# Patient Record
Sex: Female | Born: 1935
Health system: Southern US, Community
[De-identification: ages and names within clinical notes are randomized; demographics above are authoritative.]

## PROBLEM LIST (undated history)

## (undated) DIAGNOSIS — K922 Gastrointestinal hemorrhage, unspecified: Secondary | ICD-10-CM

## (undated) DIAGNOSIS — F32A Depression, unspecified: Secondary | ICD-10-CM

## (undated) DIAGNOSIS — N312 Flaccid neuropathic bladder, not elsewhere classified: Secondary | ICD-10-CM

## (undated) DIAGNOSIS — I82409 Acute embolism and thrombosis of unspecified deep veins of unspecified lower extremity: Secondary | ICD-10-CM

## (undated) DIAGNOSIS — M810 Age-related osteoporosis without current pathological fracture: Secondary | ICD-10-CM

## (undated) DIAGNOSIS — N35919 Unspecified urethral stricture, male, unspecified site: Secondary | ICD-10-CM

## (undated) DIAGNOSIS — F329 Major depressive disorder, single episode, unspecified: Secondary | ICD-10-CM

## (undated) DIAGNOSIS — I480 Paroxysmal atrial fibrillation: Secondary | ICD-10-CM

## (undated) DIAGNOSIS — R600 Localized edema: Secondary | ICD-10-CM

## (undated) DIAGNOSIS — G4733 Obstructive sleep apnea (adult) (pediatric): Secondary | ICD-10-CM

## (undated) DIAGNOSIS — R339 Retention of urine, unspecified: Secondary | ICD-10-CM

## (undated) DIAGNOSIS — I4891 Unspecified atrial fibrillation: Secondary | ICD-10-CM

## (undated) DIAGNOSIS — R609 Edema, unspecified: Secondary | ICD-10-CM

## (undated) DIAGNOSIS — I272 Pulmonary hypertension, unspecified: Secondary | ICD-10-CM

## (undated) DIAGNOSIS — K900 Celiac disease: Secondary | ICD-10-CM

## (undated) DIAGNOSIS — I341 Nonrheumatic mitral (valve) prolapse: Secondary | ICD-10-CM

## (undated) DIAGNOSIS — N183 Chronic kidney disease, stage 3 unspecified: Secondary | ICD-10-CM

## (undated) DIAGNOSIS — K649 Unspecified hemorrhoids: Secondary | ICD-10-CM

## (undated) DIAGNOSIS — H9110 Presbycusis, unspecified ear: Secondary | ICD-10-CM

## (undated) DIAGNOSIS — E039 Hypothyroidism, unspecified: Secondary | ICD-10-CM

## (undated) DIAGNOSIS — R232 Flushing: Secondary | ICD-10-CM

## (undated) DIAGNOSIS — I10 Essential (primary) hypertension: Secondary | ICD-10-CM

## (undated) DIAGNOSIS — H3530 Unspecified macular degeneration: Secondary | ICD-10-CM

## (undated) DIAGNOSIS — E041 Nontoxic single thyroid nodule: Secondary | ICD-10-CM

## (undated) DIAGNOSIS — I6529 Occlusion and stenosis of unspecified carotid artery: Secondary | ICD-10-CM

## (undated) DIAGNOSIS — I340 Nonrheumatic mitral (valve) insufficiency: Secondary | ICD-10-CM

## (undated) DIAGNOSIS — E559 Vitamin D deficiency, unspecified: Secondary | ICD-10-CM

## (undated) DIAGNOSIS — N9989 Other postprocedural complications and disorders of genitourinary system: Secondary | ICD-10-CM

## (undated) DIAGNOSIS — G2581 Restless legs syndrome: Secondary | ICD-10-CM

## (undated) DIAGNOSIS — K219 Gastro-esophageal reflux disease without esophagitis: Secondary | ICD-10-CM

## (undated) DIAGNOSIS — D649 Anemia, unspecified: Secondary | ICD-10-CM

## (undated) DIAGNOSIS — Z7901 Long term (current) use of anticoagulants: Secondary | ICD-10-CM

## (undated) DIAGNOSIS — I351 Nonrheumatic aortic (valve) insufficiency: Secondary | ICD-10-CM

## (undated) DIAGNOSIS — I5032 Chronic diastolic (congestive) heart failure: Secondary | ICD-10-CM

## (undated) DIAGNOSIS — K259 Gastric ulcer, unspecified as acute or chronic, without hemorrhage or perforation: Secondary | ICD-10-CM

## (undated) DIAGNOSIS — I2510 Atherosclerotic heart disease of native coronary artery without angina pectoris: Secondary | ICD-10-CM

## (undated) DIAGNOSIS — G4700 Insomnia, unspecified: Secondary | ICD-10-CM

## (undated) HISTORY — DX: Restless legs syndrome: G25.81

## (undated) HISTORY — DX: Nonrheumatic mitral (valve) insufficiency: I34.0

## (undated) HISTORY — DX: Depression, unspecified: F32.A

## (undated) HISTORY — DX: Unspecified macular degeneration: H35.30

## (undated) HISTORY — DX: Major depressive disorder, single episode, unspecified: F32.9

## (undated) HISTORY — DX: Nontoxic single thyroid nodule: E04.1

## (undated) HISTORY — PX: APPENDECTOMY: SHX54

## (undated) HISTORY — DX: Occlusion and stenosis of unspecified carotid artery: I65.29

## (undated) HISTORY — DX: Obstructive sleep apnea (adult) (pediatric): G47.33

## (undated) HISTORY — DX: Presbycusis, unspecified ear: H91.10

## (undated) HISTORY — DX: Vitamin D deficiency, unspecified: E55.9

## (undated) HISTORY — PX: TONSILLECTOMY: SUR1361

## (undated) HISTORY — PX: TUBAL LIGATION: SHX77

## (undated) HISTORY — DX: Gastro-esophageal reflux disease without esophagitis: K21.9

## (undated) HISTORY — DX: Unspecified urethral stricture, male, unspecified site: N35.919

## (undated) HISTORY — PX: OTHER SURGICAL HISTORY: SHX169

## (undated) HISTORY — PX: MANDIBLE SURGERY: SHX707

---

## 1997-12-27 ENCOUNTER — Other Ambulatory Visit: Admission: RE | Admit: 1997-12-27 | Discharge: 1997-12-27 | Payer: Self-pay | Admitting: Obstetrics and Gynecology

## 1998-12-29 ENCOUNTER — Other Ambulatory Visit: Admission: RE | Admit: 1998-12-29 | Discharge: 1998-12-29 | Payer: Self-pay | Admitting: Obstetrics and Gynecology

## 2001-01-30 ENCOUNTER — Other Ambulatory Visit: Admission: RE | Admit: 2001-01-30 | Discharge: 2001-01-30 | Payer: Self-pay | Admitting: Obstetrics and Gynecology

## 2001-02-13 ENCOUNTER — Ambulatory Visit (HOSPITAL_COMMUNITY): Admission: RE | Admit: 2001-02-13 | Discharge: 2001-02-13 | Payer: Self-pay | Admitting: Gastroenterology

## 2002-03-01 ENCOUNTER — Other Ambulatory Visit: Admission: RE | Admit: 2002-03-01 | Discharge: 2002-03-01 | Payer: Self-pay | Admitting: Obstetrics and Gynecology

## 2002-07-16 ENCOUNTER — Ambulatory Visit (HOSPITAL_COMMUNITY): Admission: RE | Admit: 2002-07-16 | Discharge: 2002-07-16 | Payer: Self-pay | Admitting: Geriatric Medicine

## 2003-07-31 ENCOUNTER — Ambulatory Visit (HOSPITAL_COMMUNITY): Admission: RE | Admit: 2003-07-31 | Discharge: 2003-07-31 | Payer: Self-pay | Admitting: Geriatric Medicine

## 2004-06-25 ENCOUNTER — Encounter: Admission: RE | Admit: 2004-06-25 | Discharge: 2004-06-25 | Payer: Self-pay | Admitting: Geriatric Medicine

## 2005-01-20 ENCOUNTER — Encounter: Admission: RE | Admit: 2005-01-20 | Discharge: 2005-01-20 | Payer: Self-pay | Admitting: Geriatric Medicine

## 2007-01-20 ENCOUNTER — Encounter: Payer: Self-pay | Admitting: Gastroenterology

## 2007-01-26 ENCOUNTER — Encounter: Admission: RE | Admit: 2007-01-26 | Discharge: 2007-01-26 | Payer: Self-pay | Admitting: Geriatric Medicine

## 2007-02-21 ENCOUNTER — Encounter: Admission: RE | Admit: 2007-02-21 | Discharge: 2007-02-21 | Payer: Self-pay | Admitting: Geriatric Medicine

## 2008-02-08 ENCOUNTER — Encounter: Admission: RE | Admit: 2008-02-08 | Discharge: 2008-02-08 | Payer: Self-pay | Admitting: Geriatric Medicine

## 2008-04-03 ENCOUNTER — Ambulatory Visit (HOSPITAL_COMMUNITY): Admission: RE | Admit: 2008-04-03 | Discharge: 2008-04-03 | Payer: Self-pay | Admitting: Gastroenterology

## 2008-04-03 ENCOUNTER — Encounter (INDEPENDENT_AMBULATORY_CARE_PROVIDER_SITE_OTHER): Payer: Self-pay | Admitting: Gastroenterology

## 2008-04-23 ENCOUNTER — Encounter: Payer: Self-pay | Admitting: Gastroenterology

## 2008-05-15 ENCOUNTER — Encounter (INDEPENDENT_AMBULATORY_CARE_PROVIDER_SITE_OTHER): Payer: Self-pay | Admitting: Gastroenterology

## 2008-05-15 ENCOUNTER — Ambulatory Visit (HOSPITAL_COMMUNITY): Admission: RE | Admit: 2008-05-15 | Discharge: 2008-05-15 | Payer: Self-pay | Admitting: Gastroenterology

## 2008-05-21 ENCOUNTER — Encounter: Admission: RE | Admit: 2008-05-21 | Discharge: 2008-05-21 | Payer: Self-pay | Admitting: Gastroenterology

## 2008-05-30 ENCOUNTER — Telehealth (INDEPENDENT_AMBULATORY_CARE_PROVIDER_SITE_OTHER): Payer: Self-pay | Admitting: *Deleted

## 2008-05-30 ENCOUNTER — Encounter: Payer: Self-pay | Admitting: Gastroenterology

## 2008-06-06 ENCOUNTER — Ambulatory Visit (HOSPITAL_COMMUNITY): Admission: RE | Admit: 2008-06-06 | Discharge: 2008-06-06 | Payer: Self-pay | Admitting: Gastroenterology

## 2008-06-06 ENCOUNTER — Encounter: Payer: Self-pay | Admitting: Gastroenterology

## 2008-06-13 ENCOUNTER — Ambulatory Visit: Payer: Self-pay | Admitting: Gastroenterology

## 2009-07-02 ENCOUNTER — Encounter: Admission: RE | Admit: 2009-07-02 | Discharge: 2009-07-02 | Payer: Self-pay | Admitting: Geriatric Medicine

## 2010-09-22 ENCOUNTER — Encounter: Admission: RE | Admit: 2010-09-22 | Discharge: 2010-09-22 | Payer: Self-pay | Admitting: Geriatric Medicine

## 2011-03-09 NOTE — Op Note (Signed)
NAMEFARHEEN, PFAHLER             ACCOUNT NO.:  [id number]   MEDICAL RECORD NO.:  [id number]          PATIENT TYPE:  AMB   LOCATION:  ENDO                         FACILITY:  [facility]   PHYSICIAN:  Earle Gell, M.D.   DATE OF BIRTH:  11/23/1923   DATE OF PROCEDURE:  04/03/2008  DATE OF DISCHARGE:                               OPERATIVE REPORT   REFERRING PHYSICIAN:  Hal T. [place], MD   PROCEDURE AND INDICATIONS:  Ms. Shoshanna Mcquitty is an 77 year old female  born 09/12/1924.  Ms. Jedlicka has celiac disease and follows a gluten-  free diet.  She has intermittent esophageal dysphagia.  Approximately 1  year ago, her barium swallow with barium tablet was normal.   ENDOSCOPY:  Earle Gell, MD   PREMEDICATION:  1. Versed 3.5 mg.  2. Fentanyl 50 mcg.   PROCEDURE:  After obtaining informed consent, Ms. Mulkern was placed in  the left lateral decubitus position on the fluoroscopy table.  I  administered intravenous fentanyl and intravenous Versed to achieve  conscious sedation for the procedure.  The patient's blood pressure,  oxygen saturation and cardiac rhythm were monitored throughout the  procedure and documented in the medical record.   The Pentax gastroscope was passed through the posterior hypopharynx into  the proximal esophagus without difficulty.  The hypopharynx, larynx and  vocal cords appeared normal.   ESOPHAGOSCOPY:  The proximal, mid, and lower segments of the esophageal  mucosa appear completely normal.  The squamocolumnar junction and  esophagogastric junction are noted at approximately 35 cm from the  incisor teeth.  There is no endoscopic evidence for the presence of  Barrett's esophagus, erosive esophagitis, esophageal mucosal stricture  formation or esophageal obstruction.  Approximately, 5 biopsies were  taken along the length of the esophagus to look for eosinophilic  esophagitis pathologically.   GASTROSCOPY:  Retroflex view of the gastric cardia  and fundus was  normal.  The diaphragmatic hiatus is slightly patulous.  The gastric  body appears normal.  There are a few circular erosions with exudative  bases in the gastric antrum probably due to daily aspirin.  The pylorus  appears patent.  Biopsies were taken from the distal gastric antrum.   DUODENOSCOPY:  The duodenal bulb and descending duodenum appeared  normal.  Six biopsies were taken from the second portion of duodenum and  distal duodenal bulb to look for signs of villous atrophy.   ASSESSMENT:  1. Erosions in the gastric antrum probably secondary to aspirin;      esophagogastroduodenoscopy was otherwise normal.  2. Esophageal biopsies pending, rule out eosinophilic esophagitis.  3. Gastric antral biopsies pending, rule out Helicobacter pylori      gastritis.  4. Small bowel biopsies pending, rule out villous atrophy.           ______________________________  Earle Gell, M.D.     MJ/MEDQ  D:  04/03/2008  T:  04/03/2008  Job:  [id number]   cc:   Hal T. Stoneking, M.D.  Dr. Star Age

## 2011-03-09 NOTE — Op Note (Signed)
NAME:  Maria [name], FALTER             ACCOUNT NO.:  [id number]   MEDICAL RECORD NO.:  [id number]          PATIENT TYPE:  AMB   LOCATION:  ENDO                         FACILITY:  [facility]   PHYSICIAN:  Earle Gell, M.D.   DATE OF BIRTH:  03-11-1924   DATE OF PROCEDURE:  05/15/2008  DATE OF DISCHARGE:                               OPERATIVE REPORT   INDICATIONS FOR PROCEDURE:  [name] is an 77 year old  female born, 1923-11-15.  [name] has chronic celiac disease  treated with a gluten-free diet.   Approximately 7 weeks ago, she underwent an esophagogastroduodenoscopy,  which was normal except for the presence of erosions in the gastric  antrum.  Multiple small bowel biopsies were performed and a tiny focus  of adenocarcinoma was present in the small bowel biopsy jar  pathologically.  The gastric antral biopsies did not show H. pylori or  dysplasia.   She is scheduled to undergo a repeat esophagogastroduodenoscopy with  repeat biopsies.   ENDOSCOPIST:  Earle Gell, MD.   PREMEDICATION:  Fentanyl 50 mcg, Versed 3.5 mg.   PROCEDURE:  After obtaining informed consent, Ms. Thunder was placed in  the left lateral decubitus position.  I administered intravenous  fentanyl and intravenous Versed to achieve conscious sedation for the  procedure.  The patient's blood pressure, oxygen saturation, and cardiac  rhythm were monitored throughout the procedure and documented in the  medical record.   The Pentax gastroscope was passed through the posterior hypopharynx into  the proximal esophagus without difficulty.  The hypopharynx, larynx, and  vocal cords appeared normal.   Esophagoscopy:  The proximal, mid, and lower segments of the esophageal  mucosa appeared normal.  The squamocolumnar junction is noted at 35 cm  from the incisor teeth.  There is a shallow Schatzki ring at the  esophagogastric junction.   Gastroscopy:  Retroflex view of the gastric cardia and  fundus was  normal.  The diaphragmatic hiatus was patulous.  The gastric body  appeared normal.  There are scattered circular erosions in the distal  gastric antrum.  Multiple biopsies were obtained.  The pylorus appears  normal.   Duodenoscopy:  The duodenal bulb, second portion of duodenum, and third  portion of duodenum appear completely normal.  Multiple biopsies were  taken starting in the proximal third portion of the duodenum and  advancing back into the duodenal bulb.  I did not detect any mucosal  abnormalities.   ASSESSMENT:  1. Shallow Schatzki ring at the esophagogastric junction noted at 35      cm from the incisor teeth.  2. Small circular distal gastric antral erosions, biopsied.  3. Normal duodenal mucosa exam with duodenal biopsies pending.           ______________________________  Earle Gell, M.D.     MJ/MEDQ  D:  05/15/2008  T:  05/16/2008  Job:  [id number]   cc:   Hal T. Stoneking, M.D.

## 2011-03-12 NOTE — Procedures (Signed)
[facility]  Patient:    Maria [name], Maria [name]                      MRN: [id number] Proc. Date: 02/13/01 Attending:  Mickeal Skinner, M.D. CC:         Hal T. Stoneking, M.D.             Colan Neptune., M.D.                           Procedure Report  PROCEDURE:  Colonoscopy, distal ileoscopy.  REFERRING PHYSICIANS:  Colan Neptune., M.D., Hal T. Stoneking, M.D.  PROCEDURE INDICATION:  Maria [name] (date of birth 1924-05-10) is a 77 year old female who is due for surveillance colonoscopy with polypectomy to prevent colon cancer.  Ms. Mosco does experience intermittent left lower quadrant abdominal discomfort.  ENDOSCOPIST:  Mickeal Skinner, M.D.  PREMEDICATION:  Fentanyl 50 mcg, Versed 5 mg.  ENDOSCOPE:  Olympus pediatric colonoscope.  DESCRIPTION OF PROCEDURE:  After obtaining informed consent, the patient was placed in the left lateral decubitus position.  I administered intravenous fentanyl and intravenous Versed to achieve conscious sedation for the procedure.  The patients blood pressure, oxygen saturation, and cardiac rhythm were monitored throughout the procedure and documented in the medical record.  Anal inspection was normal.  Digital rectal examination was normal.  The Olympus pediatric video colonoscope was introduced into the rectum and under direct vision advanced to the cecum.  A normal-appearing ileocecal valve was intubated and the distal ileum inspected.  Colonic preparation for the exam today was excellent.  Rectum normal.  Sigmoid colon and descending colon normal.  Splenic flexure normal.  Transverse colon normal.  Hepatic flexure normal.  Ascending colon normal.  Cecum and ileocecal valve normal.  Distal ileum normal.  ASSESSMENT:  Normal proctocolonoscopy to the cecum with intubation of a normal-appearing ileocecal valve and distal ileal inspection. DD:  02/13/01 TD:   02/14/01 Job: [id number]

## 2011-07-22 LAB — TISSUE HYBRIDIZATION (BONE MARROW)-NCBH

## 2011-11-29 DIAGNOSIS — H16109 Unspecified superficial keratitis, unspecified eye: Secondary | ICD-10-CM | POA: Diagnosis not present

## 2011-11-29 DIAGNOSIS — H259 Unspecified age-related cataract: Secondary | ICD-10-CM | POA: Diagnosis not present

## 2012-01-18 DIAGNOSIS — H2510 Age-related nuclear cataract, unspecified eye: Secondary | ICD-10-CM | POA: Diagnosis not present

## 2012-01-18 DIAGNOSIS — H269 Unspecified cataract: Secondary | ICD-10-CM | POA: Diagnosis not present

## 2012-01-18 DIAGNOSIS — H5700 Unspecified anomaly of pupillary function: Secondary | ICD-10-CM | POA: Diagnosis not present

## 2012-01-18 DIAGNOSIS — H25019 Cortical age-related cataract, unspecified eye: Secondary | ICD-10-CM | POA: Diagnosis not present

## 2012-01-22 DIAGNOSIS — H16149 Punctate keratitis, unspecified eye: Secondary | ICD-10-CM | POA: Diagnosis not present

## 2012-01-25 DIAGNOSIS — I10 Essential (primary) hypertension: Secondary | ICD-10-CM | POA: Diagnosis not present

## 2012-01-25 DIAGNOSIS — J309 Allergic rhinitis, unspecified: Secondary | ICD-10-CM | POA: Diagnosis not present

## 2012-02-14 DIAGNOSIS — N841 Polyp of cervix uteri: Secondary | ICD-10-CM | POA: Diagnosis not present

## 2012-02-14 DIAGNOSIS — Z0000 Encounter for general adult medical examination without abnormal findings: Secondary | ICD-10-CM | POA: Diagnosis not present

## 2012-02-14 DIAGNOSIS — R1900 Intra-abdominal and pelvic swelling, mass and lump, unspecified site: Secondary | ICD-10-CM | POA: Diagnosis not present

## 2012-02-14 DIAGNOSIS — Z124 Encounter for screening for malignant neoplasm of cervix: Secondary | ICD-10-CM | POA: Diagnosis not present

## 2012-02-14 DIAGNOSIS — Z01419 Encounter for gynecological examination (general) (routine) without abnormal findings: Secondary | ICD-10-CM | POA: Diagnosis not present

## 2012-02-29 DIAGNOSIS — R141 Gas pain: Secondary | ICD-10-CM | POA: Diagnosis not present

## 2012-05-02 ENCOUNTER — Other Ambulatory Visit: Payer: Self-pay | Admitting: Geriatric Medicine

## 2012-05-02 DIAGNOSIS — Z79899 Other long term (current) drug therapy: Secondary | ICD-10-CM | POA: Diagnosis not present

## 2012-05-02 DIAGNOSIS — I10 Essential (primary) hypertension: Secondary | ICD-10-CM | POA: Diagnosis not present

## 2012-05-02 DIAGNOSIS — R110 Nausea: Secondary | ICD-10-CM | POA: Diagnosis not present

## 2012-05-02 DIAGNOSIS — R209 Unspecified disturbances of skin sensation: Secondary | ICD-10-CM | POA: Diagnosis not present

## 2012-05-04 ENCOUNTER — Ambulatory Visit
Admission: RE | Admit: 2012-05-04 | Discharge: 2012-05-04 | Disposition: A | Discharge status: Home / Self Care | Payer: Medicare Other | Source: Ambulatory Visit | Attending: Geriatric Medicine | Admitting: Geriatric Medicine

## 2012-05-04 DIAGNOSIS — R110 Nausea: Secondary | ICD-10-CM

## 2012-05-04 DIAGNOSIS — R1013 Epigastric pain: Secondary | ICD-10-CM | POA: Diagnosis not present

## 2012-06-23 DIAGNOSIS — I059 Rheumatic mitral valve disease, unspecified: Secondary | ICD-10-CM | POA: Diagnosis not present

## 2012-06-23 DIAGNOSIS — R002 Palpitations: Secondary | ICD-10-CM | POA: Diagnosis not present

## 2012-06-23 DIAGNOSIS — M549 Dorsalgia, unspecified: Secondary | ICD-10-CM | POA: Diagnosis not present

## 2012-06-23 DIAGNOSIS — I10 Essential (primary) hypertension: Secondary | ICD-10-CM | POA: Diagnosis not present

## 2012-06-23 DIAGNOSIS — R5381 Other malaise: Secondary | ICD-10-CM | POA: Diagnosis not present

## 2012-06-23 DIAGNOSIS — K909 Intestinal malabsorption, unspecified: Secondary | ICD-10-CM | POA: Diagnosis not present

## 2012-06-23 DIAGNOSIS — R5383 Other fatigue: Secondary | ICD-10-CM | POA: Diagnosis not present

## 2012-06-29 DIAGNOSIS — I059 Rheumatic mitral valve disease, unspecified: Secondary | ICD-10-CM | POA: Diagnosis not present

## 2012-07-17 DIAGNOSIS — R42 Dizziness and giddiness: Secondary | ICD-10-CM | POA: Diagnosis not present

## 2012-07-17 DIAGNOSIS — R6884 Jaw pain: Secondary | ICD-10-CM | POA: Diagnosis not present

## 2012-07-17 DIAGNOSIS — R110 Nausea: Secondary | ICD-10-CM | POA: Diagnosis not present

## 2012-07-17 DIAGNOSIS — I10 Essential (primary) hypertension: Secondary | ICD-10-CM | POA: Diagnosis not present

## 2012-07-17 DIAGNOSIS — R232 Flushing: Secondary | ICD-10-CM | POA: Diagnosis not present

## 2012-07-21 DIAGNOSIS — I10 Essential (primary) hypertension: Secondary | ICD-10-CM | POA: Diagnosis not present

## 2012-07-21 DIAGNOSIS — R0989 Other specified symptoms and signs involving the circulatory and respiratory systems: Secondary | ICD-10-CM | POA: Diagnosis not present

## 2012-07-21 DIAGNOSIS — I4891 Unspecified atrial fibrillation: Secondary | ICD-10-CM | POA: Diagnosis not present

## 2012-07-21 DIAGNOSIS — K900 Celiac disease: Secondary | ICD-10-CM | POA: Diagnosis not present

## 2012-07-21 DIAGNOSIS — R0602 Shortness of breath: Secondary | ICD-10-CM | POA: Diagnosis not present

## 2012-07-21 DIAGNOSIS — R0609 Other forms of dyspnea: Secondary | ICD-10-CM | POA: Diagnosis not present

## 2012-07-21 DIAGNOSIS — R002 Palpitations: Secondary | ICD-10-CM | POA: Diagnosis not present

## 2012-07-21 DIAGNOSIS — Z79899 Other long term (current) drug therapy: Secondary | ICD-10-CM | POA: Diagnosis not present

## 2012-08-01 DIAGNOSIS — I6529 Occlusion and stenosis of unspecified carotid artery: Secondary | ICD-10-CM | POA: Diagnosis not present

## 2012-08-01 DIAGNOSIS — I10 Essential (primary) hypertension: Secondary | ICD-10-CM | POA: Diagnosis not present

## 2012-08-01 DIAGNOSIS — E041 Nontoxic single thyroid nodule: Secondary | ICD-10-CM | POA: Diagnosis not present

## 2012-08-01 DIAGNOSIS — I4891 Unspecified atrial fibrillation: Secondary | ICD-10-CM | POA: Diagnosis not present

## 2012-08-28 DIAGNOSIS — Z23 Encounter for immunization: Secondary | ICD-10-CM | POA: Diagnosis not present

## 2012-09-13 ENCOUNTER — Observation Stay (HOSPITAL_COMMUNITY)
Admission: EM | Admit: 2012-09-13 | Discharge: 2012-09-14 | Disposition: A | Discharge status: Home / Self Care | Payer: Medicare Other | Attending: Emergency Medicine | Admitting: Emergency Medicine

## 2012-09-13 ENCOUNTER — Emergency Department (HOSPITAL_COMMUNITY): Payer: Medicare Other

## 2012-09-13 ENCOUNTER — Encounter (HOSPITAL_COMMUNITY): Payer: Self-pay | Admitting: Emergency Medicine

## 2012-09-13 DIAGNOSIS — R109 Unspecified abdominal pain: Secondary | ICD-10-CM

## 2012-09-13 DIAGNOSIS — Z8719 Personal history of other diseases of the digestive system: Secondary | ICD-10-CM | POA: Diagnosis not present

## 2012-09-13 DIAGNOSIS — I10 Essential (primary) hypertension: Secondary | ICD-10-CM | POA: Diagnosis not present

## 2012-09-13 DIAGNOSIS — M810 Age-related osteoporosis without current pathological fracture: Secondary | ICD-10-CM | POA: Insufficient documentation

## 2012-09-13 DIAGNOSIS — R1013 Epigastric pain: Secondary | ICD-10-CM | POA: Diagnosis not present

## 2012-09-13 DIAGNOSIS — R1110 Vomiting, unspecified: Secondary | ICD-10-CM | POA: Diagnosis not present

## 2012-09-13 DIAGNOSIS — I4891 Unspecified atrial fibrillation: Secondary | ICD-10-CM | POA: Diagnosis not present

## 2012-09-13 HISTORY — DX: Celiac disease: K90.0

## 2012-09-13 HISTORY — DX: Essential (primary) hypertension: I10

## 2012-09-13 HISTORY — DX: Unspecified atrial fibrillation: I48.91

## 2012-09-13 HISTORY — DX: Age-related osteoporosis without current pathological fracture: M81.0

## 2012-09-13 LAB — COMPREHENSIVE METABOLIC PANEL
AST: 30 U/L (ref 0–37)
Albumin: 3.8 g/dL (ref 3.5–5.2)
Alkaline Phosphatase: 74 U/L (ref 39–117)
BUN: 10 mg/dL (ref 6–23)
CO2: 25 mEq/L (ref 19–32)
Chloride: 97 mEq/L (ref 96–112)
Creatinine, Ser: 0.54 mg/dL (ref 0.50–1.10)
GFR calc non Af Amer: 83 mL/min — ABNORMAL LOW (ref >90)
Potassium: 3.7 mEq/L (ref 3.5–5.1)
Total Bilirubin: 0.5 mg/dL (ref 0.3–1.2)

## 2012-09-13 LAB — URINALYSIS, MICROSCOPIC ONLY
Bilirubin Urine: NEGATIVE
Glucose, UA: NEGATIVE mg/dL
Ketones, ur: 15 mg/dL — AB
Protein, ur: NEGATIVE mg/dL
pH: 7 (ref 5.0–8.0)

## 2012-09-13 LAB — POCT I-STAT TROPONIN I: Troponin i, poc: 0 ng/mL (ref 0.00–0.08)

## 2012-09-13 LAB — CBC WITH DIFFERENTIAL/PLATELET
Basophils Relative: 0 % (ref 0–1)
HCT: 38.2 % (ref 36.0–46.0)
Hemoglobin: 13.4 g/dL (ref 12.0–15.0)
Lymphocytes Relative: 15 % (ref 12–46)
MCHC: 35.1 g/dL (ref 30.0–36.0)
Monocytes Absolute: 0.6 10*3/uL (ref 0.1–1.0)
Monocytes Relative: 6 % (ref 3–12)
Neutro Abs: 7.9 10*3/uL — ABNORMAL HIGH (ref 1.7–7.7)
Neutrophils Relative %: 78 % — ABNORMAL HIGH (ref 43–77)
RBC: 4.28 MIL/uL (ref 3.87–5.11)
WBC: 10.2 10*3/uL (ref 4.0–10.5)

## 2012-09-13 LAB — LACTIC ACID, PLASMA: Lactic Acid, Venous: 1.3 mmol/L (ref 0.5–2.2)

## 2012-09-13 LAB — LIPASE, BLOOD: Lipase: 24 U/L (ref 11–59)

## 2012-09-13 MED ORDER — ONDANSETRON HCL 4 MG/2ML IJ SOLN: 4.0000 mg | Freq: Four times a day (QID) | INTRAMUSCULAR | Status: DC | PRN

## 2012-09-13 MED ORDER — ONDANSETRON 4 MG PO TBDP
8.0000 mg | ORAL_TABLET | Freq: Once | ORAL | Status: AC
  Administered 2012-09-13: 8 mg via ORAL
  Filled 2012-09-13: qty 2

## 2012-09-13 MED ORDER — SODIUM CHLORIDE 0.9 % IV BOLUS (SEPSIS)
500.0000 mL | Freq: Once | INTRAVENOUS | Status: AC
  Administered 2012-09-13: 500 mL via INTRAVENOUS

## 2012-09-13 MED ORDER — ACETAMINOPHEN 325 MG PO TABS: 650.0000 mg | ORAL_TABLET | ORAL | Status: DC | PRN

## 2012-09-13 MED ORDER — MORPHINE SULFATE 2 MG/ML IJ SOLN: 2.0000 mg | INTRAMUSCULAR | Status: DC | PRN

## 2012-09-13 MED ORDER — PANTOPRAZOLE SODIUM 40 MG IV SOLR
40.0000 mg | Freq: Once | INTRAVENOUS | Status: AC
  Administered 2012-09-13: 40 mg via INTRAVENOUS
  Filled 2012-09-13: qty 40

## 2012-09-13 MED ORDER — SODIUM CHLORIDE 0.9 % IV SOLN: Freq: Once | INTRAVENOUS | Status: DC

## 2012-09-13 NOTE — ED Provider Notes (Signed)
History     CSN: [id number]  Arrival date & time 09/13/12  2103   First MD Initiated Contact with Patient 09/13/12 2132      Chief Complaint  Patient presents with  . Abdominal Pain    (Consider location/radiation/quality/duration/timing/severity/associated sxs/prior treatment) HPI Pt with complaint of epigastric abdominal pain along with emesis starting last night. Pain without radiation. Described as a cramping discomfort. Currently mild in intensity, previously severe. Symptoms have been waxing and waning. No fever, no dysuria. NBNB emesis >10 times. Tried to take tums but had no relief. Pain worse after PO intake. Not worse with exertion. No SOB.   Past Medical History  Diagnosis Date  . Hypertension   . Atrial fibrillation   . Osteoporosis   . Celiac disease     Past Surgical History  Procedure Date  . Appendectomy   . Tubal ligation     No family history on file.  History  Substance Use Topics  . Smoking status: Never Smoker   . Smokeless tobacco: Not on file  . Alcohol Use: No    OB History    Grav Para Term Preterm Abortions TAB SAB Ect Mult Living                  Review of Systems Constitutional: Negative for fever.  Eyes: Negative for vision loss.  ENT: Negative for difficulty swallowing.  Cardiovascular: Negative for chest pain. Respiratory: Negative for respiratory distress.  Gastrointestinal:  Positive for vomiting.  Genitourinary: Negative for inability to void.  Musculoskeletal: Negative for gait problem.  Integumentary: Negative for rash.  Neurological: Negative for new focal weakness.     Allergies  Review of patient's allergies indicates not on file.  Home Medications  No current outpatient prescriptions on file.  BP 174/57  Pulse 60  Temp 98.6 F (37 C) (Oral)  Resp 17  SpO2 99%  Physical Exam Nursing note and vitals reviewed.  Constitutional: Pt is alert and appears stated age. Eyes: No injection, no scleral  icterus. HENT: Atraumatic, airway open without erythema or exudate.  Respiratory: No respiratory distress. Equal breathing bilaterally. Cardiovascular: Normal rate. Extremities warm and well perfused.  Abdomen: Soft, mild epigastric tenderness. No RUQ tenderness. No rebound, no guarding.  MSK: Extremities are atraumatic without deformity. Skin: No rash, no wounds.   Neuro: No motor nor sensory deficit.     ED Course  Procedures (including critical care time)  Labs Reviewed  CBC WITH DIFFERENTIAL - Abnormal; Notable for the following:    Neutrophils Relative 78 (*)     Neutro Abs 7.9 (*)     All other components within normal limits  COMPREHENSIVE METABOLIC PANEL - Abnormal; Notable for the following:    Sodium 133 (*)     Glucose, Bld 104 (*)     GFR calc non Af Amer 83 (*)     All other components within normal limits  URINALYSIS, MICROSCOPIC ONLY - Abnormal; Notable for the following:    Hgb urine dipstick SMALL (*)     Ketones, ur 15 (*)     Leukocytes, UA TRACE (*)     All other components within normal limits  LIPASE, BLOOD  LACTIC ACID, PLASMA  POCT I-STAT TROPONIN I  URINALYSIS, ROUTINE W REFLEX MICROSCOPIC   Dg Abd Acute W/chest  09/13/2012  *RADIOLOGY REPORT*  Clinical Data:  Abdominal pain, nausea, possible gastritis  ACUTE ABDOMEN SERIES (ABDOMEN 2 VIEW & CHEST 1 VIEW)  Comparison: None  Findings: Enlargement of  cardiac silhouette. Calcification and tortuosity of the aorta. Pulmonary vascularity normal. Emphysematous changes. No infiltrate, pleural effusion or pneumothorax. Minimal diffuse interstitial prominence. Prominent first costochondral junctions. Osseous demineralization with levoconvex scoliosis lumbar spine. Normal bowel gas pattern. No bowel dilatation or bowel wall thickening, or free intraperitoneal air. No urinary tract calcification.  IMPRESSION: No acute abdominal findings. Enlargement of cardiac silhouette. Probable COPD.   Original Report  Authenticated By: Lavonia Dana, M.D.      1. Abdominal pain   2. Emesis       MDM  78 y.o. female w/ pertinent PMHx of paroxysmal a fib, HTN, celiac disease presents w/ abdominal pain, N/V. Pt's husband is retired internal medicine doctor. Doubt ACS but will evaluation for that. Pt's abdomen is soft so will hold on CT scan. Pt give ODT zofran, offered pain meds but declined. Will give 500 cc IV fluid bolus. EKG without ST elevation/depression. AAS with no acute findings. Troponin low. CBC unremarkable, lactic acid low, lipase low, CMP unremarkable. UA with trace leuks but neg nitrites and no WBC. Positive ketones. On re-eval pt stable with NAD. No return of severe pain. Abdominal exam benign. Do not feel there is evidence for ACS. Feel pain is likely GI related, possible ulcer. Long discussion with pt and husband regarding disposition. Plan to keep pt in CDU overnight for monitoring. Orders placed for dehydration observation. Plan to give IV protonix now. IV fluid ordered along with prn meds. If pt with pain overnight would consider Ct abd/pelvis. If pain free overnight would PO challenge in the AM and d/c home with PPI.   After pt and husband had chance to speak they would prefer to go home and f/u with PCP as long as pt able to tolerate PO. Pt give fluid and able to take without emesis and without return of pain. Since pt looks well with unremarkable vital signs, benign abdominal exam, no chest pain, I feel that d/c is reasonable option. Strict return precautions along with f/u instructions given. Pt and husband verbalized understanding.   I independently viewed, interpreted, and used in my medical decision making all ordered lab and imaging tests. Medical Decision Making discussed with ED attending Dot Lanes, MD     Dayton Scrape, MD 09/14/12 [id number]

## 2012-09-13 NOTE — ED Notes (Signed)
C/o epigastric pain, nausea, and dry heaves since last night.  Pt reports 3 loose BMs this morning.

## 2012-09-14 MED ORDER — ONDANSETRON 4 MG PO TBDP: 4.0000 mg | ORAL_TABLET | Freq: Three times a day (TID) | ORAL | Status: DC | PRN

## 2012-09-14 MED ORDER — PANTOPRAZOLE SODIUM 20 MG PO TBEC: 20.0000 mg | DELAYED_RELEASE_TABLET | Freq: Every day | ORAL | Status: DC

## 2012-09-14 NOTE — ED Provider Notes (Signed)
I saw and evaluated the patient, reviewed the resident's note and I agree with the findings and plan.   .Face to face Exam:  General:  Awake HEENT:  Atraumatic Resp:  Normal effort Abd:  Nondistended Neuro:No focal weakness Lymph: No adenopathy   Dot Lanes, MD 09/14/12 2231

## 2012-09-14 NOTE — ED Notes (Signed)
Instructed to return to the ED for further problems.

## 2012-09-26 DIAGNOSIS — I10 Essential (primary) hypertension: Secondary | ICD-10-CM | POA: Diagnosis not present

## 2012-09-26 DIAGNOSIS — R1013 Epigastric pain: Secondary | ICD-10-CM | POA: Diagnosis not present

## 2012-09-28 ENCOUNTER — Other Ambulatory Visit: Payer: Self-pay | Admitting: Gastroenterology

## 2012-09-28 DIAGNOSIS — D1330 Benign neoplasm of unspecified part of small intestine: Secondary | ICD-10-CM | POA: Diagnosis not present

## 2012-09-28 DIAGNOSIS — K900 Celiac disease: Secondary | ICD-10-CM | POA: Diagnosis not present

## 2012-09-28 DIAGNOSIS — R1013 Epigastric pain: Secondary | ICD-10-CM | POA: Diagnosis not present

## 2012-09-28 DIAGNOSIS — K259 Gastric ulcer, unspecified as acute or chronic, without hemorrhage or perforation: Secondary | ICD-10-CM | POA: Diagnosis not present

## 2012-09-28 DIAGNOSIS — K449 Diaphragmatic hernia without obstruction or gangrene: Secondary | ICD-10-CM | POA: Diagnosis not present

## 2012-09-28 DIAGNOSIS — K2960 Other gastritis without bleeding: Secondary | ICD-10-CM | POA: Diagnosis not present

## 2012-10-31 DIAGNOSIS — K259 Gastric ulcer, unspecified as acute or chronic, without hemorrhage or perforation: Secondary | ICD-10-CM | POA: Diagnosis not present

## 2012-10-31 DIAGNOSIS — I10 Essential (primary) hypertension: Secondary | ICD-10-CM | POA: Diagnosis not present

## 2012-11-02 DIAGNOSIS — H259 Unspecified age-related cataract: Secondary | ICD-10-CM | POA: Diagnosis not present

## 2012-11-02 DIAGNOSIS — Z961 Presence of intraocular lens: Secondary | ICD-10-CM | POA: Diagnosis not present

## 2012-11-02 DIAGNOSIS — H04129 Dry eye syndrome of unspecified lacrimal gland: Secondary | ICD-10-CM | POA: Diagnosis not present

## 2012-11-28 DIAGNOSIS — H2510 Age-related nuclear cataract, unspecified eye: Secondary | ICD-10-CM | POA: Diagnosis not present

## 2012-11-28 DIAGNOSIS — H269 Unspecified cataract: Secondary | ICD-10-CM | POA: Diagnosis not present

## 2012-11-28 DIAGNOSIS — H25019 Cortical age-related cataract, unspecified eye: Secondary | ICD-10-CM | POA: Diagnosis not present

## 2012-12-06 DIAGNOSIS — I4891 Unspecified atrial fibrillation: Secondary | ICD-10-CM | POA: Diagnosis not present

## 2012-12-06 DIAGNOSIS — K259 Gastric ulcer, unspecified as acute or chronic, without hemorrhage or perforation: Secondary | ICD-10-CM | POA: Diagnosis not present

## 2012-12-06 DIAGNOSIS — I453 Trifascicular block: Secondary | ICD-10-CM | POA: Diagnosis not present

## 2012-12-06 DIAGNOSIS — I495 Sick sinus syndrome: Secondary | ICD-10-CM | POA: Diagnosis not present

## 2012-12-06 DIAGNOSIS — I10 Essential (primary) hypertension: Secondary | ICD-10-CM | POA: Diagnosis not present

## 2012-12-15 DIAGNOSIS — R0609 Other forms of dyspnea: Secondary | ICD-10-CM | POA: Diagnosis not present

## 2012-12-15 DIAGNOSIS — I495 Sick sinus syndrome: Secondary | ICD-10-CM | POA: Diagnosis not present

## 2012-12-15 DIAGNOSIS — I4891 Unspecified atrial fibrillation: Secondary | ICD-10-CM | POA: Diagnosis not present

## 2012-12-28 DIAGNOSIS — F329 Major depressive disorder, single episode, unspecified: Secondary | ICD-10-CM | POA: Diagnosis not present

## 2012-12-28 DIAGNOSIS — G4730 Sleep apnea, unspecified: Secondary | ICD-10-CM | POA: Diagnosis not present

## 2013-01-03 DIAGNOSIS — I495 Sick sinus syndrome: Secondary | ICD-10-CM | POA: Diagnosis not present

## 2013-01-03 DIAGNOSIS — F329 Major depressive disorder, single episode, unspecified: Secondary | ICD-10-CM | POA: Diagnosis not present

## 2013-01-03 DIAGNOSIS — I10 Essential (primary) hypertension: Secondary | ICD-10-CM | POA: Diagnosis not present

## 2013-01-16 DIAGNOSIS — E871 Hypo-osmolality and hyponatremia: Secondary | ICD-10-CM | POA: Diagnosis not present

## 2013-02-02 DIAGNOSIS — I453 Trifascicular block: Secondary | ICD-10-CM | POA: Diagnosis not present

## 2013-02-02 DIAGNOSIS — I10 Essential (primary) hypertension: Secondary | ICD-10-CM | POA: Diagnosis not present

## 2013-02-02 DIAGNOSIS — Z79899 Other long term (current) drug therapy: Secondary | ICD-10-CM | POA: Diagnosis not present

## 2013-02-02 DIAGNOSIS — I495 Sick sinus syndrome: Secondary | ICD-10-CM | POA: Diagnosis not present

## 2013-02-02 DIAGNOSIS — I4891 Unspecified atrial fibrillation: Secondary | ICD-10-CM | POA: Diagnosis not present

## 2013-03-05 DIAGNOSIS — R51 Headache: Secondary | ICD-10-CM | POA: Diagnosis not present

## 2013-03-05 DIAGNOSIS — I10 Essential (primary) hypertension: Secondary | ICD-10-CM | POA: Diagnosis not present

## 2013-03-05 DIAGNOSIS — Z79899 Other long term (current) drug therapy: Secondary | ICD-10-CM | POA: Diagnosis not present

## 2013-03-05 DIAGNOSIS — R109 Unspecified abdominal pain: Secondary | ICD-10-CM | POA: Diagnosis not present

## 2013-04-16 ENCOUNTER — Other Ambulatory Visit: Payer: Self-pay | Admitting: Geriatric Medicine

## 2013-04-16 DIAGNOSIS — G4710 Hypersomnia, unspecified: Secondary | ICD-10-CM | POA: Diagnosis not present

## 2013-04-16 DIAGNOSIS — I10 Essential (primary) hypertension: Secondary | ICD-10-CM | POA: Diagnosis not present

## 2013-04-16 DIAGNOSIS — R109 Unspecified abdominal pain: Secondary | ICD-10-CM

## 2013-04-16 DIAGNOSIS — R0681 Apnea, not elsewhere classified: Secondary | ICD-10-CM | POA: Diagnosis not present

## 2013-04-16 DIAGNOSIS — G2581 Restless legs syndrome: Secondary | ICD-10-CM | POA: Diagnosis not present

## 2013-04-16 DIAGNOSIS — K5900 Constipation, unspecified: Secondary | ICD-10-CM | POA: Diagnosis not present

## 2013-04-19 ENCOUNTER — Ambulatory Visit
Admission: RE | Admit: 2013-04-19 | Discharge: 2013-04-19 | Disposition: A | Discharge status: Home / Self Care | Payer: Medicare Other | Source: Ambulatory Visit | Attending: Geriatric Medicine | Admitting: Geriatric Medicine

## 2013-04-19 DIAGNOSIS — R109 Unspecified abdominal pain: Secondary | ICD-10-CM

## 2013-04-19 DIAGNOSIS — R198 Other specified symptoms and signs involving the digestive system and abdomen: Secondary | ICD-10-CM | POA: Diagnosis not present

## 2013-05-09 DIAGNOSIS — I495 Sick sinus syndrome: Secondary | ICD-10-CM | POA: Diagnosis not present

## 2013-05-09 DIAGNOSIS — I4891 Unspecified atrial fibrillation: Secondary | ICD-10-CM | POA: Diagnosis not present

## 2013-05-09 DIAGNOSIS — Z79899 Other long term (current) drug therapy: Secondary | ICD-10-CM | POA: Diagnosis not present

## 2013-05-09 DIAGNOSIS — I10 Essential (primary) hypertension: Secondary | ICD-10-CM | POA: Diagnosis not present

## 2013-06-06 DIAGNOSIS — R3911 Hesitancy of micturition: Secondary | ICD-10-CM | POA: Diagnosis not present

## 2013-06-06 DIAGNOSIS — E039 Hypothyroidism, unspecified: Secondary | ICD-10-CM | POA: Diagnosis not present

## 2013-06-06 DIAGNOSIS — Z79899 Other long term (current) drug therapy: Secondary | ICD-10-CM | POA: Diagnosis not present

## 2013-06-06 DIAGNOSIS — R0602 Shortness of breath: Secondary | ICD-10-CM | POA: Diagnosis not present

## 2013-06-06 DIAGNOSIS — R42 Dizziness and giddiness: Secondary | ICD-10-CM | POA: Diagnosis not present

## 2013-06-06 DIAGNOSIS — R609 Edema, unspecified: Secondary | ICD-10-CM | POA: Diagnosis not present

## 2013-06-27 DIAGNOSIS — N816 Rectocele: Secondary | ICD-10-CM | POA: Diagnosis not present

## 2013-06-27 DIAGNOSIS — N8110 Cystocele, unspecified: Secondary | ICD-10-CM | POA: Diagnosis not present

## 2013-06-27 DIAGNOSIS — R609 Edema, unspecified: Secondary | ICD-10-CM | POA: Diagnosis not present

## 2013-06-27 DIAGNOSIS — Z79899 Other long term (current) drug therapy: Secondary | ICD-10-CM | POA: Diagnosis not present

## 2013-06-27 DIAGNOSIS — I10 Essential (primary) hypertension: Secondary | ICD-10-CM | POA: Diagnosis not present

## 2013-06-27 DIAGNOSIS — N312 Flaccid neuropathic bladder, not elsewhere classified: Secondary | ICD-10-CM | POA: Diagnosis not present

## 2013-06-27 DIAGNOSIS — I059 Rheumatic mitral valve disease, unspecified: Secondary | ICD-10-CM | POA: Diagnosis not present

## 2013-07-06 DIAGNOSIS — R609 Edema, unspecified: Secondary | ICD-10-CM | POA: Diagnosis not present

## 2013-07-06 DIAGNOSIS — E039 Hypothyroidism, unspecified: Secondary | ICD-10-CM | POA: Diagnosis not present

## 2013-07-06 DIAGNOSIS — Z79899 Other long term (current) drug therapy: Secondary | ICD-10-CM | POA: Diagnosis not present

## 2013-07-10 DIAGNOSIS — Z79899 Other long term (current) drug therapy: Secondary | ICD-10-CM | POA: Diagnosis not present

## 2013-07-16 DIAGNOSIS — I059 Rheumatic mitral valve disease, unspecified: Secondary | ICD-10-CM | POA: Diagnosis not present

## 2013-07-16 DIAGNOSIS — Z79899 Other long term (current) drug therapy: Secondary | ICD-10-CM | POA: Diagnosis not present

## 2013-07-30 DIAGNOSIS — I059 Rheumatic mitral valve disease, unspecified: Secondary | ICD-10-CM | POA: Diagnosis not present

## 2013-07-30 DIAGNOSIS — G4730 Sleep apnea, unspecified: Secondary | ICD-10-CM | POA: Diagnosis not present

## 2013-07-30 DIAGNOSIS — I10 Essential (primary) hypertension: Secondary | ICD-10-CM | POA: Diagnosis not present

## 2013-08-02 ENCOUNTER — Encounter: Payer: Self-pay | Admitting: Interventional Cardiology

## 2013-08-02 DIAGNOSIS — I495 Sick sinus syndrome: Secondary | ICD-10-CM | POA: Insufficient documentation

## 2013-08-02 DIAGNOSIS — R0609 Other forms of dyspnea: Secondary | ICD-10-CM

## 2013-08-02 DIAGNOSIS — Z79899 Other long term (current) drug therapy: Secondary | ICD-10-CM

## 2013-08-02 DIAGNOSIS — F32A Depression, unspecified: Secondary | ICD-10-CM | POA: Insufficient documentation

## 2013-08-02 DIAGNOSIS — I480 Paroxysmal atrial fibrillation: Secondary | ICD-10-CM | POA: Insufficient documentation

## 2013-08-02 DIAGNOSIS — E041 Nontoxic single thyroid nodule: Secondary | ICD-10-CM | POA: Insufficient documentation

## 2013-08-02 DIAGNOSIS — K259 Gastric ulcer, unspecified as acute or chronic, without hemorrhage or perforation: Secondary | ICD-10-CM | POA: Insufficient documentation

## 2013-08-02 DIAGNOSIS — G609 Hereditary and idiopathic neuropathy, unspecified: Secondary | ICD-10-CM | POA: Insufficient documentation

## 2013-08-02 DIAGNOSIS — F329 Major depressive disorder, single episode, unspecified: Secondary | ICD-10-CM

## 2013-08-02 DIAGNOSIS — I453 Trifascicular block: Secondary | ICD-10-CM | POA: Insufficient documentation

## 2013-08-02 DIAGNOSIS — E559 Vitamin D deficiency, unspecified: Secondary | ICD-10-CM | POA: Insufficient documentation

## 2013-08-02 DIAGNOSIS — I10 Essential (primary) hypertension: Secondary | ICD-10-CM | POA: Insufficient documentation

## 2013-08-03 ENCOUNTER — Ambulatory Visit: Payer: Medicare Other | Admitting: Interventional Cardiology

## 2013-08-06 HISTORY — PX: CARDIAC CATHETERIZATION: SHX172

## 2013-08-07 ENCOUNTER — Telehealth: Payer: Self-pay

## 2013-08-07 ENCOUNTER — Encounter: Payer: Self-pay | Admitting: Interventional Cardiology

## 2013-08-07 ENCOUNTER — Ambulatory Visit (INDEPENDENT_AMBULATORY_CARE_PROVIDER_SITE_OTHER): Payer: Medicare Other | Admitting: Interventional Cardiology

## 2013-08-07 VITALS — BP 170/60 | HR 46 | Ht 61.5 in | Wt 115.4 lb

## 2013-08-07 DIAGNOSIS — I4891 Unspecified atrial fibrillation: Secondary | ICD-10-CM | POA: Diagnosis not present

## 2013-08-07 DIAGNOSIS — I059 Rheumatic mitral valve disease, unspecified: Secondary | ICD-10-CM | POA: Diagnosis not present

## 2013-08-07 DIAGNOSIS — I10 Essential (primary) hypertension: Secondary | ICD-10-CM

## 2013-08-07 DIAGNOSIS — I340 Nonrheumatic mitral (valve) insufficiency: Secondary | ICD-10-CM

## 2013-08-07 DIAGNOSIS — I480 Paroxysmal atrial fibrillation: Secondary | ICD-10-CM

## 2013-08-07 DIAGNOSIS — I495 Sick sinus syndrome: Secondary | ICD-10-CM | POA: Diagnosis not present

## 2013-08-07 DIAGNOSIS — I453 Trifascicular block: Secondary | ICD-10-CM

## 2013-08-07 DIAGNOSIS — I272 Pulmonary hypertension, unspecified: Secondary | ICD-10-CM | POA: Insufficient documentation

## 2013-08-07 DIAGNOSIS — I5032 Chronic diastolic (congestive) heart failure: Secondary | ICD-10-CM

## 2013-08-07 DIAGNOSIS — I2789 Other specified pulmonary heart diseases: Secondary | ICD-10-CM

## 2013-08-07 LAB — BASIC METABOLIC PANEL
CO2: 25 mEq/L (ref 19–32)
Calcium: 9.5 mg/dL (ref 8.4–10.5)
Creatinine, Ser: 0.9 mg/dL (ref 0.4–1.2)
GFR: 67.18 mL/min (ref >60.00)
Sodium: 129 mEq/L — ABNORMAL LOW (ref 135–145)

## 2013-08-07 LAB — CBC WITH DIFFERENTIAL/PLATELET
Basophils Relative: 0.6 % (ref 0.0–3.0)
Eosinophils Absolute: 0.1 10*3/uL (ref 0.0–0.7)
Hemoglobin: 13 g/dL (ref 12.0–15.0)
Lymphocytes Relative: 23.5 % (ref 12.0–46.0)
MCHC: 33.7 g/dL (ref 30.0–36.0)
Monocytes Relative: 9.8 % (ref 3.0–12.0)
Neutrophils Relative %: 64.4 % (ref 43.0–77.0)
RBC: 4.1 Mil/uL (ref 3.87–5.11)
WBC: 5.6 10*3/uL (ref 4.5–10.5)

## 2013-08-07 NOTE — Patient Instructions (Signed)
Your physician has requested that you have a cardiac catheterization. Cardiac catheterization is used to diagnose and/or treat various heart conditions. Doctors may recommend this procedure for a number of different reasons. The most common reason is to evaluate chest pain. Chest pain can be a symptom of coronary artery disease (CAD), and cardiac catheterization can show whether plaque is narrowing or blocking your heart's arteries. This procedure is also used to evaluate the valves, as well as measure the blood flow and oxygen levels in different parts of your heart. For further information please visit [email or web address]. Please follow instruction sheet, as given.   Your Catheterization is scheduled for 08/16/13 @ 9am  Labs today: BMP, CBC

## 2013-08-07 NOTE — Progress Notes (Signed)
Patient ID: Maria [name], female   DOB: Dec 31, 1923, 79 y.o.   MRN: [id number]    HPI Maria [name] has had progressive exertional fatigue and dyspnea for 12-24 months. My first encounter with her had to do with management of paroxysmal atrial fibrillation, earlier this year. She was loaded with amiodarone as an outpatient, and atrial fibrillation has come under good control. Despite control of atrial fib, exertional fatigue and dyspnea have worsened. She began having lower extremity edema. A 2-D Doppler echocardiogram was ordered and demonstrated moderate to severe mitral regurgitation with significant left atrial enlargement and moderate to severe pulmonary hypertension (PAS P. 55 mmHg). Recently because of lower extremity edema, furosemide was increased to 40 mg per day. She is here today for clinical followup. Lower extremity edema has improved. She still has occasional orthopnea. She has no exertional tolerance according to her husband. She is also recently experienced some vague chest discomfort that is nonexertional. The discomfort has been very mild and graded to be less than 2/10 in intensity. No particular precipitants have been identified. We had a long discussion concerning the implications of the recent echocardiogram which demonstrated the findings noted above as well as an LVEF of 60%. Despite her age they want to have an aggressive approach to the mitral valve disease and would consider surgery if necessary.  Allergies  Allergen Reactions  . Amoxicillin     Nausea, dizziness  . Codeine Nausea And Vomiting  . Fosamax [Alendronate Sodium]     Jaw pain  . Gluten Meal     Celiacs  . Hctz [Hydrochlorothiazide]     Hyponatremia, GI upset  . Tetanus Toxoids     Current Outpatient Prescriptions  Medication Sig Dispense Refill  . amiodarone (PACERONE) 200 MG tablet Take 200 mg by mouth daily.      Marland Kitchen aspirin EC 81 MG tablet Take 81 mg by mouth daily.      . calcium-vitamin D (OSCAL WITH  D) 500-200 MG-UNIT per tablet Take 1 tablet by mouth daily.      . cholecalciferol (VITAMIN D) 400 UNITS TABS tablet Take 400 Units by mouth daily.      . furosemide (LASIX) 20 MG tablet Take 40 mg by mouth daily.       Marland Kitchen levothyroxine (SYNTHROID, LEVOTHROID) 50 MCG tablet Take 50 mcg by mouth daily before breakfast.      . losartan (COZAAR) 100 MG tablet Take 100 mg by mouth daily.       . Melatonin 3 MG TABS Take 1.5 mg by mouth at bedtime.      . metoprolol succinate (TOPROL-XL) 100 MG 24 hr tablet Take 50 mg by mouth daily. Take with or immediately following a meal.      . pantoprazole (PROTONIX) 20 MG tablet Take 1 tablet (20 mg total) by mouth daily.  30 tablet  0  . sertraline (ZOLOFT) 50 MG tablet Take 50 mg by mouth 3 (three) times a week.       No current facility-administered medications for this visit.    Past Medical History  Diagnosis Date  . Hypertension   . Atrial fibrillation   . Osteoporosis   . Celiac disease   . Celiac disease   . Carotid artery occlusion     right carotid stenosis 40-60%, 4/08, neg for stenosis repeat study 11/11  . Depression   . Presbycusis   . Macular degeneration   . Urethral stricture   . Moderate aortic insufficiency   . Moderate  mitral regurgitation by prior echocardiogram     echo 9/13   . GERD (gastroesophageal reflux disease)   . Thyroid nodule     38m, no change 11/11  . RLS (restless legs syndrome)   . Vitamin D deficiency   . Moderate obstructive sleep apnea   . Heart murmur     Past Surgical History  Procedure Laterality Date  . Appendectomy    . Tubal ligation      ROS: Denies chills, fever, weight loss, stroke, transient neurological symptoms, claudication, or wheezing, hemoptysis, blood in the stool urine. She has a history of sleep apnea. Sleep apnea is being treated for the past month or 2. Occasional left arm and leg numbness.  PHYSICAL EXAM BP 170/60  Pulse 46  Ht 5' 1.5" (1.562 m)  Wt 115 lb 6.4 oz (52.345  kg)  BMI 21.416kg/m22 79year old female who appears young and than her stated age. Skin is clear. Chest is clear. Neck exam reveals moderate JVD with a CV wave noted. No carotid bruits are heard. Cardiac exam reveals a left lateral chest and posterior 2-3 of 6 systolic murmur of mitral regurgitation that is late peaking. Extremities reveal no edema. Femoral, popliteal, and posterior tibial pulses are 2+ and symmetric. No femoral bruits are heard. Neurological exam is intact. Mild decrease in memory. No motor or sensory deficits noted.  EKG: right bundle branch block, sinus bradycardia, left axis deviation, first degree AV block consistent with trifascicular block  ECHO: 06/2013 Conclusions: 1. Moderate to severe mitral valve regurgitation. 2. Left ventricular ejection fraction estimated by 2D at 60-65 percent. 3. There is borderline concentric left ventricle hypertrophy. 4. Mild mitral annular calcification. 5. Mild calcification of the aortic valve. 6. Mild to moderate aortic valve regurgitation. 7. Mild left atrial enlargement. 8. Severely elevated estimated right ventricular systolic pressure. 9. There is mild tricuspid regurgitation. 10. Analysis of mitral valve inflow, pulmonary vein Doppler and tissue Doppler suggests grade III diastolic dysfunction with elevated left atrial pressures.  ASSESSMENT AND PLAN  1. MODERATE TO SEVERE MITRAL REGURGITATION, CLINICALLY PRODUCING PULMONARY HYPERTENSION AND SYMPTOMS OF RIGHT HEART FAILURE.  2. PULMONARY HYPERTENSION, RULE OUT MIXED ETIOLOGY SUCH AS COMBINED SLEEP APNEA/MITRAL VALVE DISEASE/CHRONIC LUNG DISEASE  3. CHEST DISCOMFORT RAISES CONCERN FOR CORONARY ARTERY DISEASE  4. ACUTE ON CHRONIC DIASTOLIC HEART FAILURE IMPROVED WITH DIURETIC THERAPY    5. Elderly but fit and desiring to proceed with a more definitive treatment if one exists.  6. Paroxysmal atrial fibrillation / with sinus node dysfunction, controlled on amiodarone  therapy  Overall, we must forget that [name] 79years of age. She is in relatively good health. Whether or not she can undergo sternotomy with mitral mouth surgery and possible coronary bypass grafting is difficult to know. Despite controlling atrial fibrillation, she has continued to get progressively worse with exertional fatigue and dyspnea. With this in mind we have decided to proceed with left and right heart catheterization to fully define the hemodynamic consequences of her mitral valve disease and to rule out coronary artery disease. We discussed the possible need for a transesophageal echo as well. Depending upon findings, consideration of minimally invasive mitral valve repair will be considered. The cardiac catheterization including the risk of stroke, death, myocardial infarction, bleeding, kidney failure, allergy, limb ischemia, among others were discussed in detail with the patient and accepted. Her husband Dr. [place]Guillermina Citywas present during the conversation.

## 2013-08-07 NOTE — Telephone Encounter (Signed)
pt was in office today and wanted to verify when was the last time she had a chest xray adv pt husband it was  on 05/09/13 and that she would be ok to have her cath on 08/16/13 with Dr.Smith

## 2013-08-08 ENCOUNTER — Telehealth: Payer: Self-pay

## 2013-08-08 NOTE — Telephone Encounter (Signed)
lab resultsgiven to pt husband. pt husband verbalized understanding

## 2013-08-08 NOTE — Telephone Encounter (Signed)
Message copied by Lamar Laundry on Wed Aug 08, 2013  1:34 PM ------      Message from: Daneen Schick      Created: Wed Aug 08, 2013 12:52 PM       Sodium is a little low as before. Labs okay for cath. ------

## 2013-08-15 ENCOUNTER — Encounter (HOSPITAL_COMMUNITY): Payer: Self-pay | Admitting: Pharmacy Technician

## 2013-08-16 ENCOUNTER — Ambulatory Visit (HOSPITAL_COMMUNITY)
Admission: RE | Admit: 2013-08-16 | Discharge: 2013-08-16 | Disposition: A | Discharge status: Home / Self Care | Payer: Medicare Other | Source: Ambulatory Visit | Attending: Interventional Cardiology | Admitting: Interventional Cardiology

## 2013-08-16 ENCOUNTER — Encounter (HOSPITAL_COMMUNITY)
Admission: RE | Disposition: A | Discharge status: Home / Self Care | Payer: Self-pay | Source: Ambulatory Visit | Attending: Interventional Cardiology

## 2013-08-16 DIAGNOSIS — I2510 Atherosclerotic heart disease of native coronary artery without angina pectoris: Secondary | ICD-10-CM | POA: Diagnosis not present

## 2013-08-16 DIAGNOSIS — I340 Nonrheumatic mitral (valve) insufficiency: Secondary | ICD-10-CM

## 2013-08-16 DIAGNOSIS — I272 Pulmonary hypertension, unspecified: Secondary | ICD-10-CM

## 2013-08-16 DIAGNOSIS — I10 Essential (primary) hypertension: Secondary | ICD-10-CM | POA: Diagnosis not present

## 2013-08-16 DIAGNOSIS — K219 Gastro-esophageal reflux disease without esophagitis: Secondary | ICD-10-CM | POA: Diagnosis not present

## 2013-08-16 DIAGNOSIS — I4891 Unspecified atrial fibrillation: Secondary | ICD-10-CM | POA: Insufficient documentation

## 2013-08-16 DIAGNOSIS — Z79899 Other long term (current) drug therapy: Secondary | ICD-10-CM | POA: Diagnosis not present

## 2013-08-16 DIAGNOSIS — I209 Angina pectoris, unspecified: Secondary | ICD-10-CM | POA: Diagnosis not present

## 2013-08-16 DIAGNOSIS — I059 Rheumatic mitral valve disease, unspecified: Secondary | ICD-10-CM | POA: Insufficient documentation

## 2013-08-16 DIAGNOSIS — I5032 Chronic diastolic (congestive) heart failure: Secondary | ICD-10-CM | POA: Diagnosis not present

## 2013-08-16 DIAGNOSIS — I2789 Other specified pulmonary heart diseases: Secondary | ICD-10-CM | POA: Diagnosis not present

## 2013-08-16 HISTORY — PX: LEFT AND RIGHT HEART CATHETERIZATION WITH CORONARY ANGIOGRAM: SHX5449

## 2013-08-16 LAB — POCT I-STAT 3, VENOUS BLOOD GAS (G3P V)
Acid-base deficit: 5 mmol/L — ABNORMAL HIGH (ref 0.0–2.0)
Bicarbonate: 22.3 mEq/L (ref 20.0–24.0)
O2 Saturation: 67 %
TCO2: 24 mmol/L (ref 0–100)
pO2, Ven: 39 mmHg (ref 30.0–45.0)

## 2013-08-16 LAB — POCT I-STAT 3, ART BLOOD GAS (G3+)
Acid-base deficit: 2 mmol/L (ref 0.0–2.0)
Bicarbonate: 24.2 mEq/L — ABNORMAL HIGH (ref 20.0–24.0)
TCO2: 26 mmol/L (ref 0–100)
pO2, Arterial: 71 mmHg — ABNORMAL LOW (ref 80.0–100.0)

## 2013-08-16 LAB — PROTIME-INR: INR: 1.03 (ref 0.00–1.49)

## 2013-08-16 SURGERY — LEFT AND RIGHT HEART CATHETERIZATION WITH CORONARY ANGIOGRAM
Anesthesia: LOCAL

## 2013-08-16 MED ORDER — ONDANSETRON HCL 4 MG/2ML IJ SOLN: 4.0000 mg | Freq: Four times a day (QID) | INTRAMUSCULAR | Status: DC | PRN

## 2013-08-16 MED ORDER — HEPARIN (PORCINE) IN NACL 2-0.9 UNIT/ML-% IJ SOLN
INTRAMUSCULAR | Status: AC
  Filled 2013-08-16: qty 1500

## 2013-08-16 MED ORDER — SODIUM CHLORIDE 0.9 % IJ SOLN: 3.0000 mL | Freq: Two times a day (BID) | INTRAMUSCULAR | Status: DC

## 2013-08-16 MED ORDER — SODIUM CHLORIDE 0.9 % IV SOLN: 250.0000 mL | INTRAVENOUS | Status: DC | PRN

## 2013-08-16 MED ORDER — ACETAMINOPHEN 325 MG PO TABS
650.0000 mg | ORAL_TABLET | ORAL | Status: DC | PRN
  Administered 2013-08-16: 650 mg via ORAL
  Filled 2013-08-16: qty 2

## 2013-08-16 MED ORDER — LIDOCAINE HCL (PF) 1 % IJ SOLN
INTRAMUSCULAR | Status: AC
  Filled 2013-08-16: qty 30

## 2013-08-16 MED ORDER — SODIUM CHLORIDE 0.9 % IJ SOLN: 3.0000 mL | INTRAMUSCULAR | Status: DC | PRN

## 2013-08-16 MED ORDER — SODIUM CHLORIDE 0.9 % IV SOLN
INTRAVENOUS | Status: DC
  Administered 2013-08-16: 08:00:00 via INTRAVENOUS

## 2013-08-16 MED ORDER — NITROGLYCERIN 0.2 MG/ML ON CALL CATH LAB
INTRAVENOUS | Status: AC
  Filled 2013-08-16: qty 1

## 2013-08-16 MED ORDER — MIDAZOLAM HCL 2 MG/2ML IJ SOLN
INTRAMUSCULAR | Status: AC
  Filled 2013-08-16: qty 2

## 2013-08-16 MED ORDER — SODIUM CHLORIDE 0.9 % IV SOLN: INTRAVENOUS | Status: DC

## 2013-08-16 MED ORDER — FENTANYL CITRATE 0.05 MG/ML IJ SOLN
INTRAMUSCULAR | Status: AC
  Filled 2013-08-16: qty 2

## 2013-08-16 MED ORDER — ASPIRIN 81 MG PO CHEW: 81.0000 mg | CHEWABLE_TABLET | ORAL | Status: DC

## 2013-08-16 NOTE — H&P (View-Only) (Signed)
Patient ID: Maria [name], female   DOB: [birth year], 79 y.o.   MRN: [id number]    HPI Meghan has had progressive exertional fatigue and dyspnea for 12-24 months. My first encounter with her had to do with management of paroxysmal atrial fibrillation, earlier this year. She was loaded with amiodarone as an outpatient, and atrial fibrillation has come under good control. Despite control of atrial fib, exertional fatigue and dyspnea have worsened. She began having lower extremity edema. A 2-D Doppler echocardiogram was ordered and demonstrated moderate to severe mitral regurgitation with significant left atrial enlargement and moderate to severe pulmonary hypertension (PAS P. 55 mmHg). Recently because of lower extremity edema, furosemide was increased to 40 mg per day. She is here today for clinical followup. Lower extremity edema has improved. She still has occasional orthopnea. She has no exertional tolerance according to her husband. She is also recently experienced some vague chest discomfort that is nonexertional. The discomfort has been very mild and graded to be less than 2/10 in intensity. No particular precipitants have been identified. We had a long discussion concerning the implications of the recent echocardiogram which demonstrated the findings noted above as well as an LVEF of 60%. Despite her age they want to have an aggressive approach to the mitral valve disease and would consider surgery if necessary.  Allergies  Allergen Reactions  . Amoxicillin     Nausea, dizziness  . Codeine Nausea And Vomiting  . Fosamax [Alendronate Sodium]     Jaw pain  . Gluten Meal     Celiacs  . Hctz [Hydrochlorothiazide]     Hyponatremia, GI upset  . Tetanus Toxoids     Current Outpatient Prescriptions  Medication Sig Dispense Refill  . amiodarone (PACERONE) 200 MG tablet Take 200 mg by mouth daily.      Marland Kitchen aspirin EC 81 MG tablet Take 81 mg by mouth daily.      . calcium-vitamin D (OSCAL WITH  D) 500-200 MG-UNIT per tablet Take 1 tablet by mouth daily.      . cholecalciferol (VITAMIN D) 400 UNITS TABS tablet Take 400 Units by mouth daily.      . furosemide (LASIX) 20 MG tablet Take 40 mg by mouth daily.       Marland Kitchen levothyroxine (SYNTHROID, LEVOTHROID) 50 MCG tablet Take 50 mcg by mouth daily before breakfast.      . losartan (COZAAR) 100 MG tablet Take 100 mg by mouth daily.       . Melatonin 3 MG TABS Take 1.5 mg by mouth at bedtime.      . metoprolol succinate (TOPROL-XL) 100 MG 24 hr tablet Take 50 mg by mouth daily. Take with or immediately following a meal.      . pantoprazole (PROTONIX) 20 MG tablet Take 1 tablet (20 mg total) by mouth daily.  30 tablet  0  . sertraline (ZOLOFT) 50 MG tablet Take 50 mg by mouth 3 (three) times a week.       No current facility-administered medications for this visit.    Past Medical History  Diagnosis Date  . Hypertension   . Atrial fibrillation   . Osteoporosis   . Celiac disease   . Celiac disease   . Carotid artery occlusion     right carotid stenosis 40-60%, 4/08, neg for stenosis repeat study 11/11  . Depression   . Presbycusis   . Macular degeneration   . Urethral stricture   . Moderate aortic insufficiency   . Moderate  mitral regurgitation by prior echocardiogram     echo 9/13   . GERD (gastroesophageal reflux disease)   . Thyroid nodule     6m, no change 11/11  . RLS (restless legs syndrome)   . Vitamin D deficiency   . Moderate obstructive sleep apnea   . Heart murmur     Past Surgical History  Procedure Laterality Date  . Appendectomy    . Tubal ligation      ROS: Denies chills, fever, weight loss, stroke, transient neurological symptoms, claudication, or wheezing, hemoptysis, blood in the stool urine. She has a history of sleep apnea. Sleep apnea is being treated for the past month or 2. Occasional left arm and leg numbness.  PHYSICAL EXAM BP 170/60  Pulse 46  Ht 5' 1.5" (1.562 m)  Wt 115 lb 6.4 oz (52.345  kg)  BMI 21.41kg/m222 79year old female who appears young and than her stated age. Skin is clear. Chest is clear. Neck exam reveals moderate JVD with a CV wave noted. No carotid bruits are heard. Cardiac exam reveals a left lateral chest and posterior 2-3 of 6 systolic murmur of mitral regurgitation that is late peaking. Extremities reveal no edema. Femoral, popliteal, and posterior tibial pulses are 2+ and symmetric. No femoral bruits are heard. Neurological exam is intact. Mild decrease in memory. No motor or sensory deficits noted.  EKG: right bundle branch block, sinus bradycardia, left axis deviation, first degree AV block consistent with trifascicular block  ECHO: 06/2013 Conclusions: 1. Moderate to severe mitral valve regurgitation. 2. Left ventricular ejection fraction estimated by 2D at 60-65 percent. 3. There is borderline concentric left ventricle hypertrophy. 4. Mild mitral annular calcification. 5. Mild calcification of the aortic valve. 6. Mild to moderate aortic valve regurgitation. 7. Mild left atrial enlargement. 8. Severely elevated estimated right ventricular systolic pressure. 9. There is mild tricuspid regurgitation. 10. Analysis of mitral valve inflow, pulmonary vein Doppler and tissue Doppler suggests grade III diastolic dysfunction with elevated left atrial pressures.  ASSESSMENT AND PLAN  1. MODERATE TO SEVERE MITRAL REGURGITATION, CLINICALLY PRODUCING PULMONARY HYPERTENSION AND SYMPTOMS OF RIGHT HEART FAILURE.  2. PULMONARY HYPERTENSION, RULE OUT MIXED ETIOLOGY SUCH AS COMBINED SLEEP APNEA/MITRAL VALVE DISEASE/CHRONIC LUNG DISEASE  3. CHEST DISCOMFORT RAISES CONCERN FOR CORONARY ARTERY DISEASE  4. ACUTE ON CHRONIC DIASTOLIC HEART FAILURE IMPROVED WITH DIURETIC THERAPY    5. Elderly but fit and desiring to proceed with a more definitive treatment if one exists.  6. Paroxysmal atrial fibrillation / with sinus node dysfunction, controlled on amiodarone  therapy  Overall, we must forget that Ms. GManois 79years of age. She is in relatively good health. Whether or not she can undergo sternotomy with mitral mouth surgery and possible coronary bypass grafting is difficult to know. Despite controlling atrial fibrillation, she has continued to get progressively worse with exertional fatigue and dyspnea. With this in mind we have decided to proceed with left and right heart catheterization to fully define the hemodynamic consequences of her mitral valve disease and to rule out coronary artery disease. We discussed the possible need for a transesophageal echo as well. Depending upon findings, consideration of minimally invasive mitral valve repair will be considered. The cardiac catheterization including the risk of stroke, death, myocardial infarction, bleeding, kidney failure, allergy, limb ischemia, among others were discussed in detail with the patient and accepted. Her husband Dr. [place]ina [place] present during the conversation.

## 2013-08-16 NOTE — Progress Notes (Signed)
UP AND WALKED AND TOL WELL; RIGHT GROIN STABLE; NO BLEEDING OR HEMATOMA

## 2013-08-16 NOTE — CV Procedure (Signed)
     Left and Right Heart Catheterization with Coronary Angiography  Report  Maria [name]  79 y.o.  female December 03, 1923  Procedure Date: 08/16/2013  Referring Physician: [name], M.D. Primary Cardiologist:: Helyn Numbers, M.D.  INDICATIONS: Left heart failure, angina, and echo documented moderate mitral regurgitation. The studies being done to define coronary anatomy and hemodynamic consequences of mitral regurgitation.  PROCEDURE: 1. Left heart cath; 2. Right heart cath; 3. Left ventriculography; 4. Coronary angiography  CONSENT:  The risks, benefits, and details of the procedure were explained in detail to the patient. Risks including death, stroke, heart attack, kidney injury, allergy, limb ischemia, bleeding and radiation injury were discussed.  The patient verbalized understanding and wanted to proceed.  Informed written consent was obtained.  PROCEDURE TECHNIQUE:  After Xylocaine anesthesia a 5 French sheath was placed in the right femoral artery and 7 French sheath in the right femoral vein with single anterior needle wall sticks.  Coronary angiography was done using a 5 [place] A2 multipurpose and JR 4 catheter.  Left ventriculography was done using the 5 French angled pigtail catheter using power injection.   A main pulmonary artery O2 saturation as well as central aortic recording was performed. Thermodilution cardiac outputs were performed.  MEDICATIONS: Versed 1 mg; fentanyl 50 mcg IV   EQUIPMENT: 5 [place] A2 MP, 5 [place] JR 4, 5 [place] A2 MP  CONTRAST:  Total of 95 cc.  COMPLICATIONS:  None   HEMODYNAMICS:  Aortic pressure 132/50; LV pressure 138/10; LVEDP 19, RA 8 mm mercury (mean), RV 44/12, PA 44/18, PCWP(mean) 18 with a V waves to 30 mm mercury, Cardiac Output 4.37 L per minute (Fick) and 3.13 L per minute (thermal), AV gradient none  ANGIOGRAPHIC DATA:   The left main coronary artery is 20% ostial.  The left anterior descending artery is heavily  calcified. The proximal vessel contains segmental 70% stenosis that overlaps the first diagonal. The first diagonal, relatively small, contains 95% stenosis. The LAD distal to the second diagonal contains segmental 95% stenosis. The apical LAD is patent..  The left circumflex artery is 80-90% stenosis in the midsegment (tandem stenoses). The vessel trifurcates inferolateral.  The right coronary artery is dominant. Irregularities are noted mid to distal. Large PDA is patent. 2 small left ventricular branches are noted.Marland Kitchen  BYPASS GRAFT ANGIOGRAPHY: Not applicable  PCI RESULTS: Not applicable  LEFT VENTRICULOGRAM:  Left ventricular angiogram was done in the 30 RAO projection and revealed a left ventricular ejection fraction of 65%. No regional wall motion abnormality noted. Mitral regurgitation is graded to be moderate (2-3+)  in severity.   IMPRESSIONS:  1. Moderate to moderately severe mitral regurgitation  2. Severe two-vessel coronary artery disease including diffuse LAD disease and relatively focal circumflex disease  3. Normal left ventricular systolic function with elevated end-diastolic pressures consistent with diastolic heart failure but she exhibits clinically  4. Moderate pulmonary hypertension   RECOMMENDATION:  TCTS consultation for consideration of revascularization and valve repair versus alternative treatments which would include conservative medical management and possibly PCI although anatomy is unfavorable in the LAD.

## 2013-08-16 NOTE — Interval H&P Note (Signed)
History and Physical Interval Note:  08/16/2013 8:39 AM The patient has recently developed heart failure. She is moderate to severe mitral regurgitation by noninvasive evaluation. She has also been having angina. She is undergoing left and right heart catheterization with coronary angiography to define coronary anatomy and assess the hemodynamics of the patient's mitral valve lesion. The procedure and its risks were discussed with the patient in detail in the office. Patient is willing to proceed per [name]  has presented today for surgery, with the diagnosis of Chest pain  The various methods of treatment have been discussed with the patient and family. After consideration of risks, benefits and other options for treatment, the patient has consented to  Procedure(s): LEFT AND RIGHT HEART CATHETERIZATION WITH CORONARY ANGIOGRAM (N/A) as a surgical intervention .  The patient's history has been reviewed, patient examined, no change in status, stable for surgery.  I have reviewed the patient's chart and labs.  Questions were answered to the patient's satisfaction.     [name]

## 2013-08-17 ENCOUNTER — Telehealth: Payer: Self-pay | Admitting: Interventional Cardiology

## 2013-08-17 NOTE — Telephone Encounter (Signed)
New Problem:  Pt's husband states his wife had a cath yesterday and Dr. [name] told them the surgeon would call them today. Pt's husband states no one has called them today. Please advise

## 2013-08-17 NOTE — Telephone Encounter (Signed)
pt sts that appt was made with Dr.[name] for10/28/14.pt sts that she has some soreness in her lower back.she will contiue to take pain meds, rest, and monitor soreness. if it has not improved by Monday she will discuss with Dr.[name] at her f/u.

## 2013-08-20 ENCOUNTER — Other Ambulatory Visit: Payer: Self-pay | Admitting: *Deleted

## 2013-08-20 ENCOUNTER — Institutional Professional Consult (permissible substitution) (INDEPENDENT_AMBULATORY_CARE_PROVIDER_SITE_OTHER): Payer: Medicare Other | Admitting: Cardiothoracic Surgery

## 2013-08-20 ENCOUNTER — Encounter: Payer: Self-pay | Admitting: Cardiothoracic Surgery

## 2013-08-20 VITALS — BP 161/64 | HR 53 | Resp 18 | Ht 61.0 in | Wt 114.0 lb

## 2013-08-20 DIAGNOSIS — I059 Rheumatic mitral valve disease, unspecified: Secondary | ICD-10-CM

## 2013-08-20 DIAGNOSIS — I2510 Atherosclerotic heart disease of native coronary artery without angina pectoris: Secondary | ICD-10-CM

## 2013-08-20 NOTE — Patient Instructions (Signed)
Stop cozaar 36 hours before surgery Take metoprolol on morning of surgery    Mitral Valvular Regurgitation Mitral valvular regurgitation (MVR, MR) is a condition in which there is a leaky mitral valve. The mitral valve is the large valve between the two left chambers of the heart. When the large muscular ventricle contracts to pump blood, the mitral valve keeps that blood from flowing backward and back into the atrium. If there is too much regurgitation, the heart has to work harder. This eventually can cause heart failure. When your heart goes into failure, you do not feel well. You have shortness of breath (dyspnea) with exertion. The kidneys do not work as well so you may retain fluid. This is one of the reasons your lower legs and ankles may swell. You may have a rapid weight gain. In addition to this swelling, the fluid retention makes fluid back up in the lungs. This causes additional shortness of breath, which then makes the failure worse. The first sign you will usually recognize is shortness of breath with exertion (climbing stairs for example). You will also usually get a rapid heartbeat. Upon discharge from this location, weigh yourself after arriving home. Record your weight at the same time every day as this will provide a record of your progress. As you get better, your weight will usually go down. Follow a low sodium (low salt) diet. You may notice that you get short of breath while sleeping. The heart actually has to work harder while you are lying down. This may also produce a night cough or make it necessary to sleep with two or more pillows. SYMPTOMS  You may have no symptoms if mitral regurgitation is mild. It may be discovered only during a routine exam by your caregiver when a heart murmur is heard. DIAGNOSIS  The best study for mitral regurgitation is the echocardiogram (ultrasound of the heart). This shows the cause of the MR and how bad it is. It also gives information about the  left ventricle and atrium (the two heart chambers that are bridged by the mitral valve.) TREATMENT   Early MR may be treated with medications. If there are no medication allergies or problems, ACE Inhibitors are commonly used in the treatment of MR. Under treatment, these symptoms usually improve rapidly. Medications treat but will not cure or slow the progression of the MR. This is more dependent on the cause of the MR.  If MR becomes more severe, surgery may become necessary to repair or replace the valve. This is called open heart surgery.  There is no absolute age limitation to valve surgery. 77 year old people have had their valves replaced. The risk of stroke and death is low with open heart surgery, but does increase a with age and other medical problems. Elderly patients that are otherwise healthy usually do well with valve replacement surgery.  A cardiac catheterization is usually done prior to valve surgery unless the patient is very young. This is done to check the health of coronary arteries. If there are blockages, a bypass can be done while the chest is open for the valve replacement. If you are beginning to have symptoms from mitral regurgitation or are waiting for a surgical procedure to help you with this problem, following are some of the things you can do to help yourself while you are waiting for surgery or are simply putting off the surgery to see if it is needed. HOME CARE INSTRUCTIONS   Activity Level--- Your caregiver  will help you determine what type of exercise program may be helpful. It is important to maintain strength and increase it if possible. Pace your activities and avoid shortness of breath or chest pain. Plan activities for at least an hour after meals or before eating. This allows your body to handle one activity at a time. Your caregiver can help advise you for activities.  Diet--- Maintain a low salt diet or as directed by your caregiver and eat a heart  healthy diet. Get diet information from your caregiver or dietician. Remove your salt shaker and avoid adding salt to you foods. Measure the amounts of fluids you take in per day in cups and record these amounts.  Discharge Medications--- You may have been prescribed an ACE inhibitor or a beta blocker to take for your heart failure. Take either as directed as this improves your heart function and your survival. Ask your caregiver if being on statins (cholesterol lowering drugs) would be helpful.  Weight Monitoring---Weigh yourself today. When you get home, compare it to your scale and record your weight. Weigh twice per day and record these weights and try to weigh at the same time every day. It is best to weigh first thing in the morning, in your same clothes, after going to the bathroom and before eating or drinking anything. Place the scale on a hard surfaced floor. Bring these weights to your caregiver to be reviewed during your appointments.  Blood pressure monitoring should be done twice per week. You can get a home blood pressure cuff at your drugstore. Record these values and bring them with you for your clinic visits. Notify your caregiver if you become dizzy or lightheaded upon standing up.  Be familiar with your medications--- If you have trouble remembering when you took them, write down times or set your medications out in advance for the day or the week to avoid problems. If you are on medications and do not remember if you have taken your medication, just skip it for that day unless your caregiver advises you otherwise. If you are on a diuretic (water pill), take these in the morning so you are not up all night going to the bathroom.  If you are currently a smoker, it is time to quit. Nicotine makes your heart work harder and is one of the leading causes of cardiac (heart) deaths. Do not leave without a smoking cessation plan or instructions on help available to quit smoking.  Immunization  with influenza and pneumococcal vaccines may reduce the risk of respiratory infection.  Nonsteroidal anti-inflammatory drugs should not be used. They can cause sodium (salt) retention and also may hurt the action of diuretics and ACE inhibitors.  Aldosterone Antagonists may have beneficial effects.  If you do not follow your diet and take your medications properly, this may rapidly lead to emergency care or hospitalization. Follow the advice of your caregiver.  What To Do If Symptoms Worsen--- If there are immediate problems go to the Emergency Department. This would include any symptoms which brought you in and which are getting worse rather than better. Call emergency services (911 in [place] for immediate care. DAILY PATH TO QUALITY LIVING  Monitor weight and record.  Monitor blood pressure and record.  Monitor fluid intake.  Monitor Sodium intake.  Monitor Activity Levels.  Take your medications.  Stop all use of nicotine.  Avoid alcohol.  Know when to call for help and do so. SEEK IMMEDIATE MEDICAL CARE IF:  Your weight  increases by 3 lb/1.4 kg in 1 day or 5 lb/2.3 kg in a week, or as your caregiver suggests.  You notice increasing shortness of breath during rest, sleeping, or with activity, and which is unusual for you.  You develop chest pain.  You develop sweating or nausea which is unusual for you.  You notice increased swelling in your hands, feet, ankles or abdomen.  You have a feeling of fullness in your abdomen or develop nausea or loss of appetite.  You notice dizziness, blurred vision, headache, or unsteadiness. Make an appointment with your caregiver as directed for follow-up. MAKE SURE YOU:   Understand these instructions.  Will watch your condition.  Will get help right away if you are not doing well or get worse. Document Released: 12/29/2004 Document Revised: 01/03/2012 Document Reviewed: 12/08/2007 [facility] Patient Information 2014 [place].

## 2013-08-20 NOTE — Progress Notes (Signed)
[address]       [place] [address]             [phone]                    Maria [name] Maria [name] Medical Record #[id number] Date of Birth: [birth year]  Referring: [name], MD Primary Care: Mathews Argyle, MD  Chief Complaint:    Chief Complaint  Patient presents with  . Coronary Artery Disease    eval for CABG, poss MVR, cath by Dr. [name] on 08/16/13    History of Present Illness:    Patient is a independent 79 year old female with progressive symptoms of angina and congestive heart failure she underwent cardiac catheterization several days ago and is now referred by Dr. [name] to consider treatment options.   The patient has had progressive exertional fatigue and dyspnea for 12 months. She developed paroxysmal atrial fibrillation approximately 15 months ago has had 3 distinct episodes. She was loaded with amiodarone and atrial fibrillation has come under good control. She was noted to develop hypothyroidism secondary 2 amiodarone. Despite control of atrial fib, exertional fatigue and dyspnea have worsened. She began having lower extremity edema.  A 2-D Doppler echocardiogram  demonstrated moderate to severe mitral regurgitation with significant left atrial enlargement and moderate to severe pulmonary hypertension ( 55 mmHg by echo). With the addition of Lasix lower. lower extremity edema has improved. She still has  orthopnea. She has no exertional tolerance according to her husband. She is also recently experienced some vague chest discomfort that is nonexertional. The discomfort has been  mild and graded to be less than 2/10 in intensity. She now notes that she has been able to walk 100 yards to the dining hall at her current living situation.    Current Activity/ Functional Status:  Patient is independent with mobility/ambulation, transfers, ADL's, IADL's.  Zubrod Score: At the time of surgery this patient's most appropriate  activity status/level should be described as: []  Normal activity, no symptoms []  Symptoms, fully ambulatory [x]  Symptoms, in bed less than or equal to 50% of the time []  Symptoms, in bed greater than 50% of the time but less than 100% []  Bedridden []  Moribund   Past Medical History  Diagnosis Date  . Hypertension   . Atrial fibrillation   . Osteoporosis   . Celiac disease   . Celiac disease   . Carotid artery occlusion     right carotid stenosis 40-60%, 4/08, neg for stenosis repeat study 11/11  . Depression   . Presbycusis   . Macular degeneration   . Urethral stricture   . Moderate aortic insufficiency   . Moderate mitral regurgitation by prior echocardiogram     echo 9/13   . GERD (gastroesophageal reflux disease)   . Thyroid nodule     68m, no change 11/11  . RLS (restless legs syndrome)   . Vitamin D deficiency   . Moderate obstructive sleep apnea   . Heart murmur     Past Surgical History  Procedure Laterality Date  . Appendectomy    . Tubal ligation      Family History  Problem Relation Age of Onset  . Heart attack Mother   . CVA Mother   . CAD Father   . Colon cancer Father   . Heart attack Father   . Heart attack Brother   . Peripheral vascular disease Brother   . AAA (  abdominal aortic aneurysm) Brother     History   Social History  . Marital Status: Married    Spouse Name: Dr Guillermina City    Number of Children: N/A  . Years of Education: N/A         Social History Main Topics  . Smoking status: Never Smoker   . Smokeless tobacco: Not on file  . Alcohol Use: No  . Drug Use: No  .       History  Smoking status  . Never Smoker   Smokeless tobacco  . Not on file    History  Alcohol Use No     Allergies  Allergen Reactions  . Amoxicillin     Nausea, dizziness  . Codeine Nausea And Vomiting  . Fosamax [Alendronate Sodium]     Jaw pain  . Gluten Meal     Celiacs  . Hctz [Hydrochlorothiazide]     Hyponatremia, GI upset    . Tetanus Toxoids     Current Outpatient Prescriptions  Medication Sig Dispense Refill  . amiodarone (PACERONE) 200 MG tablet Take 200 mg by mouth daily.      Marland Kitchen aspirin EC 81 MG tablet Take 81 mg by mouth daily.      . calcium-vitamin D (OSCAL WITH D) 500-200 MG-UNIT per tablet Take 1 tablet by mouth daily.      . cholecalciferol (VITAMIN D) 400 UNITS TABS tablet Take 400 Units by mouth daily.      . furosemide (LASIX) 20 MG tablet Take 40 mg by mouth daily.       Marland Kitchen levothyroxine (SYNTHROID, LEVOTHROID) 50 MCG tablet Take 50 mcg by mouth daily before breakfast.      . losartan (COZAAR) 100 MG tablet Take 100 mg by mouth daily.       . magnesium oxide (MAG-OX) 400 MG tablet Take 400 mg by mouth daily.      . Melatonin 3 MG TABS Take 3 mg by mouth at bedtime.       . metoprolol succinate (TOPROL-XL) 50 MG 24 hr tablet Take 50 mg by mouth daily. Take with or immediately following a meal.      . omeprazole (PRILOSEC) 20 MG capsule Take 20 mg by mouth daily.      . sertraline (ZOLOFT) 50 MG tablet Take 50 mg by mouth 3 (three) times a week. Tues, Thurs and Saturday       No current facility-administered medications for this visit.       Review of Systems:     Cardiac Review of Systems: Y or N  Chest Pain [  y  ]  Resting SOB [  n ] Exertional SOB  Blue.Reese  ]  Orthopnea Blue.Reese  ]   Pedal Edema [ y, now better  ]    Palpitations Blue.Reese  ] Syncope  Florencio.Farrier  ]   Presyncope [ y  ]  General Review of Systems: [Y] = yes [  ]=no Constitional: recent weight change [n  ]; anorexia [  ]; fatigue [  ]; nausea [  ]; night sweats [  ]; fever [  ]; or chills [  ];  Dental: poor dentition[ n ]; Last Dentist visit: every 6 months  Eye : blurred vision [  ]; diplopia [   ]; vision changes [  ];  Amaurosis fugax[  ]; Resp: cough [  ];  wheezing[  ];  hemoptysis[  ]; shortness of breath[   ]; paroxysmal nocturnal dyspnea[  ]; dyspnea on exertion[  ]; or orthopnea[  ];  GI:  gallstones[  ], vomiting[  ];  dysphagia[  ]; melena[ n ];  hematochezia [n  ]; heartburn[  ];   Hx of  Colonoscopy[ y ]; GU: kidney stones [  ]; hematuria[  ];   dysuria [  ];  nocturia[  ];  history of     obstruction [  ]; urinary frequency [ n ]             Skin: rash, swelling[  ];, hair loss[  ];  peripheral edema[  ];  or itching[  ]; Musculosketetal: myalgias[  ];  joint swelling[  ];  joint erythema[  ];  joint pain[  ];  back pain[  ];  Heme/Lymph: bruising[  ];  bleeding[  ];  anemia[  ];  Neuro: TIA[  ];  headaches[  ];  stroke[  ];  vertigo[  ];  seizures[  ];   paresthesias[  ];  difficulty walking[  ];  Psych:depression[  ]; anxiety[  ];  Endocrine: diabetes[  ];  thyroid dysfunction[  ];  Immunizations: Flu [  ]; Pneumococcal[  ];  Other:  Physical Exam: BP 161/64  Pulse 53  Resp 18  Ht 5' 1"  (1.549 m)  Wt 114 lb (51.71 kg)  BMI 21.55 kg/m2  SpO2 98%  General appearance: alert, cooperative and Appears younger than stated age Neurologic: intact Heart: regular rate and rhythm, S1, S2 normal, no murmur, click, rub or gallop, murmur of mitral regurgitation 2/6 at the cardiac apex nonradiating Lungs: clear to auscultation bilaterally and normal percussion bilaterally Abdomen: soft, non-tender; bowel sounds normal; no masses,  no organomegaly Extremities: extremities normal, atraumatic, no cyanosis or edema, Homans sign is negative, no sign of DVT, no edema, redness or tenderness in the calves or thighs and no ulcers, gangrene or trophic changes Patient has no carotid bruits, no cervical supraclavicular adenopathy palpable radial and DP and PT pulses   Diagnostic Studies & Laboratory data:     Recent Radiology Findings:  No results found.    Recent Lab Findings: Lab Results  Component Value Date   WBC 5.6 08/07/2013   HGB 13.0 08/07/2013   HCT 38.6 08/07/2013   PLT 264.0  08/07/2013   GLUCOSE 106* 08/07/2013   ALT 21 09/13/2012   AST 30 09/13/2012   NA 129* 08/07/2013   K 5.0 08/07/2013   CL 95* 08/07/2013   CREATININE 0.9 08/07/2013   BUN 11 08/07/2013   CO2 25 08/07/2013   INR 1.03 08/16/2013   CATH: Procedure Date: 08/16/2013  Referring Physician: [name], M.D.  Primary Cardiologist:: Helyn Numbers, M.D.  INDICATIONS: Left heart failure, angina, and echo documented moderate mitral regurgitation. The studies being done to define coronary anatomy and hemodynamic consequences of mitral regurgitation.  PROCEDURE: 1. Left heart cath; 2. Right heart cath; 3. Left ventriculography; 4. Coronary angiography  CONSENT:  The risks, benefits, and details of the procedure were explained in detail to the patient. Risks including death, stroke, heart attack, kidney injury, allergy, limb ischemia, bleeding and radiation injury were discussed. The patient verbalized understanding  and wanted to proceed. Informed written consent was obtained.  PROCEDURE TECHNIQUE: After Xylocaine anesthesia a 5 French sheath was placed in the right femoral artery and 7 French sheath in the right femoral vein with single anterior needle wall sticks. Coronary angiography was done using a 5 [place] A2 multipurpose and JR 4 catheter. Left ventriculography was done using the 5 French angled pigtail catheter using power injection.  A main pulmonary artery O2 saturation as well as central aortic recording was performed. Thermodilution cardiac outputs were performed.  MEDICATIONS: Versed 1 mg; fentanyl 50 mcg IV  EQUIPMENT: 5 [place] A2 MP, 5 [place] JR 4, 5 [place] A2 MP  CONTRAST: Total of 95 cc.  COMPLICATIONS: None  HEMODYNAMICS: Aortic pressure 132/50; LV pressure 138/10; LVEDP 19, RA 8 mm mercury (mean), RV 44/12, PA 44/18, PCWP(mean) 18 with a V waves to 30 mm mercury, Cardiac Output 4.37 L per minute (Fick) and 3.13 L per minute (thermal), AV gradient none  ANGIOGRAPHIC DATA: The  left main coronary artery is 20% ostial.  The left anterior descending artery is heavily calcified. The proximal vessel contains segmental 70% stenosis that overlaps the first diagonal. The first diagonal, relatively small, contains 95% stenosis. The LAD distal to the second diagonal contains segmental 95% stenosis. The apical LAD is patent..  The left circumflex artery is 80-90% stenosis in the midsegment (tandem stenoses). The vessel trifurcates inferolateral.  The right coronary artery is dominant. Irregularities are noted mid to distal. Large PDA is patent. 2 small left ventricular branches are noted.Marland Kitchen  BYPASS GRAFT ANGIOGRAPHY: Not applicable  PCI RESULTS: Not applicable  LEFT VENTRICULOGRAM: Left ventricular angiogram was done in the 30 RAO projection and revealed a left ventricular ejection fraction of 65%. No regional wall motion abnormality noted. Mitral regurgitation is graded to be moderate (2-3+) in severity.  IMPRESSIONS: 1. Moderate to moderately severe mitral regurgitation  2. Severe two-vessel coronary artery disease including diffuse LAD disease and relatively focal circumflex disease  3. Normal left ventricular systolic function with elevated end-diastolic pressures consistent with diastolic heart failure but she exhibits clinically  4. Moderate pulmonary hypertension  RECOMMENDATION: TCTS consultation for consideration of revascularization and valve repair versus alternative treatments which would include conservative medical management and possibly PCI although anatomy is unfavorable in the LAD.   There is no current echo for review    Assessment / Plan:      Patient is active 79 year old female without significant comorbidities who presents with 12-15 months of progressive evidence of right heart failure dyspnea on exertion and now probable anginal symptoms. Cardiac catheterization shows significant 2 vessel coronary artery disease involving the LAD and circumflex. Previous  echocardiogram suggested moderate to severe mitral regurgitation. The degree of regurgitation at the time of cardiac catheterization is difficult to judge.  I spent more than 60 minutes reviewing with the patient her husband and daughter the diagnosis,  Symptoms and treatment options including coronary artery bypass grafting and possible mitral valve repair and replacement. In spite of her age of 15 years she has no other significant comorbidities, her major limiting symptoms appear to be related to coronary artery disease and probable mitral insufficiency. The patient is willing to proceed with invasive route of treatment understanding that the risks of surgery and her age are increased. We will obtain an  updated  transthoracic echocardiogram for my review. Software problems prevent the review of the previous echocardiogram.   Tentatively plan to proceed with surgery on November 4.   Maria [name]  Maria Nasby MD      [address] [place] [address] Office [phone]   Beeper [phone]  08/20/2013 6:01 PM

## 2013-08-21 ENCOUNTER — Ambulatory Visit (HOSPITAL_COMMUNITY)
Admission: RE | Admit: 2013-08-21 | Discharge: 2013-08-21 | Disposition: A | Discharge status: Home / Self Care | Payer: Medicare Other | Source: Ambulatory Visit | Attending: Cardiothoracic Surgery | Admitting: Cardiothoracic Surgery

## 2013-08-21 DIAGNOSIS — I4891 Unspecified atrial fibrillation: Secondary | ICD-10-CM | POA: Diagnosis not present

## 2013-08-21 DIAGNOSIS — I2510 Atherosclerotic heart disease of native coronary artery without angina pectoris: Secondary | ICD-10-CM | POA: Diagnosis not present

## 2013-08-21 DIAGNOSIS — I080 Rheumatic disorders of both mitral and aortic valves: Secondary | ICD-10-CM | POA: Insufficient documentation

## 2013-08-21 DIAGNOSIS — I079 Rheumatic tricuspid valve disease, unspecified: Secondary | ICD-10-CM | POA: Insufficient documentation

## 2013-08-21 DIAGNOSIS — I059 Rheumatic mitral valve disease, unspecified: Secondary | ICD-10-CM

## 2013-08-21 DIAGNOSIS — I359 Nonrheumatic aortic valve disorder, unspecified: Secondary | ICD-10-CM

## 2013-08-21 DIAGNOSIS — I10 Essential (primary) hypertension: Secondary | ICD-10-CM | POA: Diagnosis not present

## 2013-08-21 DIAGNOSIS — Z23 Encounter for immunization: Secondary | ICD-10-CM | POA: Diagnosis not present

## 2013-08-21 DIAGNOSIS — I509 Heart failure, unspecified: Secondary | ICD-10-CM | POA: Insufficient documentation

## 2013-08-21 NOTE — Progress Notes (Signed)
  Echocardiogram 2D Echocardiogram has been performed.  Maria [name] 08/21/2013, 11:16 AM

## 2013-08-23 ENCOUNTER — Encounter (HOSPITAL_COMMUNITY): Payer: Self-pay | Admitting: Pharmacy Technician

## 2013-08-27 ENCOUNTER — Ambulatory Visit (HOSPITAL_COMMUNITY)
Admission: RE | Admit: 2013-08-27 | Discharge: 2013-08-27 | Disposition: A | Discharge status: Home / Self Care | Payer: Medicare Other | Source: Ambulatory Visit | Attending: Cardiothoracic Surgery | Admitting: Cardiothoracic Surgery

## 2013-08-27 ENCOUNTER — Encounter (HOSPITAL_COMMUNITY): Payer: Self-pay

## 2013-08-27 ENCOUNTER — Encounter (HOSPITAL_COMMUNITY)
Admission: RE | Admit: 2013-08-27 | Discharge: 2013-08-27 | Disposition: A | Discharge status: Home / Self Care | Payer: Medicare Other | Source: Ambulatory Visit | Attending: Cardiothoracic Surgery | Admitting: Cardiothoracic Surgery

## 2013-08-27 ENCOUNTER — Encounter (HOSPITAL_COMMUNITY): Payer: Self-pay | Admitting: Certified Registered Nurse Anesthetist

## 2013-08-27 VITALS — BP 155/74 | HR 49 | Temp 97.4°F | Resp 18 | Ht 62.5 in | Wt 112.7 lb

## 2013-08-27 DIAGNOSIS — I059 Rheumatic mitral valve disease, unspecified: Secondary | ICD-10-CM | POA: Diagnosis not present

## 2013-08-27 DIAGNOSIS — I509 Heart failure, unspecified: Secondary | ICD-10-CM | POA: Diagnosis present

## 2013-08-27 DIAGNOSIS — G4733 Obstructive sleep apnea (adult) (pediatric): Secondary | ICD-10-CM | POA: Diagnosis present

## 2013-08-27 DIAGNOSIS — J90 Pleural effusion, not elsewhere classified: Secondary | ICD-10-CM | POA: Diagnosis not present

## 2013-08-27 DIAGNOSIS — K219 Gastro-esophageal reflux disease without esophagitis: Secondary | ICD-10-CM | POA: Diagnosis present

## 2013-08-27 DIAGNOSIS — I2789 Other specified pulmonary heart diseases: Secondary | ICD-10-CM | POA: Diagnosis present

## 2013-08-27 DIAGNOSIS — F3289 Other specified depressive episodes: Secondary | ICD-10-CM | POA: Diagnosis not present

## 2013-08-27 DIAGNOSIS — E041 Nontoxic single thyroid nodule: Secondary | ICD-10-CM | POA: Diagnosis present

## 2013-08-27 DIAGNOSIS — J9600 Acute respiratory failure, unspecified whether with hypoxia or hypercapnia: Secondary | ICD-10-CM | POA: Diagnosis not present

## 2013-08-27 DIAGNOSIS — M6250 Muscle wasting and atrophy, not elsewhere classified, unspecified site: Secondary | ICD-10-CM | POA: Diagnosis not present

## 2013-08-27 DIAGNOSIS — M810 Age-related osteoporosis without current pathological fracture: Secondary | ICD-10-CM | POA: Diagnosis present

## 2013-08-27 DIAGNOSIS — J811 Chronic pulmonary edema: Secondary | ICD-10-CM | POA: Diagnosis not present

## 2013-08-27 DIAGNOSIS — I4891 Unspecified atrial fibrillation: Secondary | ICD-10-CM | POA: Diagnosis not present

## 2013-08-27 DIAGNOSIS — I5020 Unspecified systolic (congestive) heart failure: Secondary | ICD-10-CM | POA: Diagnosis not present

## 2013-08-27 DIAGNOSIS — Z48812 Encounter for surgical aftercare following surgery on the circulatory system: Secondary | ICD-10-CM | POA: Diagnosis not present

## 2013-08-27 DIAGNOSIS — I080 Rheumatic disorders of both mitral and aortic valves: Secondary | ICD-10-CM | POA: Diagnosis present

## 2013-08-27 DIAGNOSIS — Z794 Long term (current) use of insulin: Secondary | ICD-10-CM | POA: Diagnosis not present

## 2013-08-27 DIAGNOSIS — J9383 Other pneumothorax: Secondary | ICD-10-CM | POA: Diagnosis not present

## 2013-08-27 DIAGNOSIS — F329 Major depressive disorder, single episode, unspecified: Secondary | ICD-10-CM | POA: Diagnosis present

## 2013-08-27 DIAGNOSIS — R0609 Other forms of dyspnea: Secondary | ICD-10-CM | POA: Diagnosis not present

## 2013-08-27 DIAGNOSIS — I10 Essential (primary) hypertension: Secondary | ICD-10-CM | POA: Diagnosis present

## 2013-08-27 DIAGNOSIS — I5032 Chronic diastolic (congestive) heart failure: Secondary | ICD-10-CM | POA: Diagnosis not present

## 2013-08-27 DIAGNOSIS — R0602 Shortness of breath: Secondary | ICD-10-CM | POA: Diagnosis not present

## 2013-08-27 DIAGNOSIS — Z881 Allergy status to other antibiotic agents status: Secondary | ICD-10-CM | POA: Diagnosis not present

## 2013-08-27 DIAGNOSIS — K900 Celiac disease: Secondary | ICD-10-CM | POA: Diagnosis not present

## 2013-08-27 DIAGNOSIS — Z889 Allergy status to unspecified drugs, medicaments and biological substances status: Secondary | ICD-10-CM | POA: Diagnosis not present

## 2013-08-27 DIAGNOSIS — R488 Other symbolic dysfunctions: Secondary | ICD-10-CM | POA: Diagnosis not present

## 2013-08-27 DIAGNOSIS — I079 Rheumatic tricuspid valve disease, unspecified: Secondary | ICD-10-CM | POA: Diagnosis present

## 2013-08-27 DIAGNOSIS — I2510 Atherosclerotic heart disease of native coronary artery without angina pectoris: Secondary | ICD-10-CM

## 2013-08-27 DIAGNOSIS — Z79899 Other long term (current) drug therapy: Secondary | ICD-10-CM | POA: Diagnosis not present

## 2013-08-27 DIAGNOSIS — J9819 Other pulmonary collapse: Secondary | ICD-10-CM | POA: Diagnosis not present

## 2013-08-27 DIAGNOSIS — Z951 Presence of aortocoronary bypass graft: Secondary | ICD-10-CM | POA: Diagnosis not present

## 2013-08-27 DIAGNOSIS — M6281 Muscle weakness (generalized): Secondary | ICD-10-CM | POA: Diagnosis not present

## 2013-08-27 DIAGNOSIS — E038 Other specified hypothyroidism: Secondary | ICD-10-CM | POA: Diagnosis present

## 2013-08-27 DIAGNOSIS — J449 Chronic obstructive pulmonary disease, unspecified: Secondary | ICD-10-CM | POA: Diagnosis not present

## 2013-08-27 DIAGNOSIS — G2581 Restless legs syndrome: Secondary | ICD-10-CM | POA: Diagnosis present

## 2013-08-27 DIAGNOSIS — Z7982 Long term (current) use of aspirin: Secondary | ICD-10-CM | POA: Diagnosis not present

## 2013-08-27 DIAGNOSIS — D62 Acute posthemorrhagic anemia: Secondary | ICD-10-CM | POA: Diagnosis not present

## 2013-08-27 DIAGNOSIS — E039 Hypothyroidism, unspecified: Secondary | ICD-10-CM | POA: Diagnosis not present

## 2013-08-27 DIAGNOSIS — Z4682 Encounter for fitting and adjustment of non-vascular catheter: Secondary | ICD-10-CM | POA: Diagnosis not present

## 2013-08-27 DIAGNOSIS — R262 Difficulty in walking, not elsewhere classified: Secondary | ICD-10-CM | POA: Diagnosis not present

## 2013-08-27 DIAGNOSIS — I5033 Acute on chronic diastolic (congestive) heart failure: Secondary | ICD-10-CM | POA: Diagnosis not present

## 2013-08-27 DIAGNOSIS — I200 Unstable angina: Secondary | ICD-10-CM | POA: Diagnosis present

## 2013-08-27 DIAGNOSIS — Z5189 Encounter for other specified aftercare: Secondary | ICD-10-CM | POA: Diagnosis not present

## 2013-08-27 DIAGNOSIS — J95821 Acute postprocedural respiratory failure: Secondary | ICD-10-CM | POA: Diagnosis not present

## 2013-08-27 DIAGNOSIS — Z01810 Encounter for preprocedural cardiovascular examination: Secondary | ICD-10-CM

## 2013-08-27 DIAGNOSIS — I498 Other specified cardiac arrhythmias: Secondary | ICD-10-CM | POA: Diagnosis not present

## 2013-08-27 HISTORY — DX: Gastric ulcer, unspecified as acute or chronic, without hemorrhage or perforation: K25.9

## 2013-08-27 HISTORY — DX: Unspecified hemorrhoids: K64.9

## 2013-08-27 HISTORY — DX: Insomnia, unspecified: G47.00

## 2013-08-27 HISTORY — DX: Localized edema: R60.0

## 2013-08-27 HISTORY — DX: Pulmonary hypertension, unspecified: I27.20

## 2013-08-27 HISTORY — DX: Edema, unspecified: R60.9

## 2013-08-27 HISTORY — DX: Other postprocedural complications and disorders of genitourinary system: N99.89

## 2013-08-27 HISTORY — DX: Atherosclerotic heart disease of native coronary artery without angina pectoris: I25.10

## 2013-08-27 HISTORY — DX: Retention of urine, unspecified: R33.9

## 2013-08-27 HISTORY — DX: Gastrointestinal hemorrhage, unspecified: K92.2

## 2013-08-27 HISTORY — DX: Nonrheumatic mitral (valve) prolapse: I34.1

## 2013-08-27 HISTORY — DX: Hypothyroidism, unspecified: E03.9

## 2013-08-27 HISTORY — DX: Flushing: R23.2

## 2013-08-27 LAB — SURGICAL PCR SCREEN
MRSA, PCR: NEGATIVE
Staphylococcus aureus: NEGATIVE

## 2013-08-27 LAB — COMPREHENSIVE METABOLIC PANEL
ALT: 48 U/L — ABNORMAL HIGH (ref 0–35)
AST: 50 U/L — ABNORMAL HIGH (ref 0–37)
Albumin: 3.8 g/dL (ref 3.5–5.2)
Alkaline Phosphatase: 90 U/L (ref 39–117)
BUN: 13 mg/dL (ref 6–23)
CO2: 18 mEq/L — ABNORMAL LOW (ref 19–32)
Calcium: 9 mg/dL (ref 8.4–10.5)
Chloride: 94 mEq/L — ABNORMAL LOW (ref 96–112)
Creatinine, Ser: 0.72 mg/dL (ref 0.50–1.10)
GFR calc Af Amer: 87 mL/min — ABNORMAL LOW (ref >90)
GFR calc non Af Amer: 75 mL/min — ABNORMAL LOW (ref >90)
Glucose, Bld: 94 mg/dL (ref 70–99)
Potassium: 4.4 mEq/L (ref 3.5–5.1)
Sodium: 124 mEq/L — ABNORMAL LOW (ref 135–145)
Total Bilirubin: 0.6 mg/dL (ref 0.3–1.2)
Total Protein: 7.8 g/dL (ref 6.0–8.3)

## 2013-08-27 LAB — BLOOD GAS, ARTERIAL
Acid-base deficit: 2.4 mmol/L — ABNORMAL HIGH (ref 0.0–2.0)
Bicarbonate: 21.8 mEq/L (ref 20.0–24.0)
Drawn by: 206361
FIO2: 0.21 %
O2 Saturation: 92.8 %
Patient temperature: 98.6
TCO2: 22.9 mmol/L (ref 0–100)
pCO2 arterial: 36.7 mmHg (ref 35.0–45.0)
pH, Arterial: 7.391 (ref 7.350–7.450)
pO2, Arterial: 66.9 mmHg — ABNORMAL LOW (ref 80.0–100.0)

## 2013-08-27 LAB — CBC
HCT: 39.1 % (ref 36.0–46.0)
Hemoglobin: 13.6 g/dL (ref 12.0–15.0)
MCH: 31.9 pg (ref 26.0–34.0)
MCHC: 34.8 g/dL (ref 30.0–36.0)
MCV: 91.8 fL (ref 78.0–100.0)
Platelets: 270 10*3/uL (ref 150–400)
RBC: 4.26 MIL/uL (ref 3.87–5.11)
RDW: 14 % (ref 11.5–15.5)
WBC: 7.4 10*3/uL (ref 4.0–10.5)

## 2013-08-27 LAB — URINALYSIS, ROUTINE W REFLEX MICROSCOPIC
Bilirubin Urine: NEGATIVE
Glucose, UA: NEGATIVE mg/dL
Ketones, ur: NEGATIVE mg/dL
Nitrite: NEGATIVE
Protein, ur: NEGATIVE mg/dL
Specific Gravity, Urine: 1.012 (ref 1.005–1.030)
Urobilinogen, UA: 0.2 mg/dL (ref 0.0–1.0)
pH: 7.5 (ref 5.0–8.0)

## 2013-08-27 LAB — PULMONARY FUNCTION TEST

## 2013-08-27 LAB — PROTIME-INR
INR: 1.1 (ref 0.00–1.49)
Prothrombin Time: 14 seconds (ref 11.6–15.2)

## 2013-08-27 LAB — APTT: aPTT: 30 seconds (ref 24–37)

## 2013-08-27 LAB — URINE MICROSCOPIC-ADD ON

## 2013-08-27 LAB — HEMOGLOBIN A1C
Hgb A1c MFr Bld: 5.8 % — ABNORMAL HIGH (ref <5.7)
Mean Plasma Glucose: 120 mg/dL — ABNORMAL HIGH (ref <117)

## 2013-08-27 LAB — TYPE AND SCREEN
ABO/RH(D): O POS
Antibody Screen: NEGATIVE

## 2013-08-27 LAB — ABO/RH: ABO/RH(D): O POS

## 2013-08-27 MED ORDER — SODIUM CHLORIDE 0.9 % IV SOLN
INTRAVENOUS | Status: DC
  Filled 2013-08-27: qty 40

## 2013-08-27 MED ORDER — CHLORHEXIDINE GLUCONATE 4 % EX LIQD: 30.0000 mL | CUTANEOUS | Status: DC

## 2013-08-27 MED ORDER — SODIUM CHLORIDE 0.9 % IV SOLN
INTRAVENOUS | Status: DC
  Filled 2013-08-27: qty 30

## 2013-08-27 MED ORDER — ALBUTEROL SULFATE (5 MG/ML) 0.5% IN NEBU
2.5000 mg | INHALATION_SOLUTION | Freq: Once | RESPIRATORY_TRACT | Status: AC
  Administered 2013-08-27: 2.5 mg via RESPIRATORY_TRACT

## 2013-08-27 MED ORDER — MAGNESIUM SULFATE 50 % IJ SOLN
40.0000 meq | INTRAMUSCULAR | Status: DC
  Filled 2013-08-27: qty 10

## 2013-08-27 MED ORDER — PHENYLEPHRINE HCL 10 MG/ML IJ SOLN
30.0000 ug/min | INTRAVENOUS | Status: DC
  Filled 2013-08-27: qty 2

## 2013-08-27 MED ORDER — DOPAMINE-DEXTROSE 3.2-5 MG/ML-% IV SOLN
2.0000 ug/kg/min | INTRAVENOUS | Status: DC
  Filled 2013-08-27: qty 250

## 2013-08-27 MED ORDER — NITROGLYCERIN IN D5W 200-5 MCG/ML-% IV SOLN
2.0000 ug/min | INTRAVENOUS | Status: DC
  Filled 2013-08-27: qty 250

## 2013-08-27 MED ORDER — EPINEPHRINE HCL 1 MG/ML IJ SOLN
0.5000 ug/min | INTRAVENOUS | Status: DC
  Filled 2013-08-27: qty 4

## 2013-08-27 MED ORDER — SODIUM CHLORIDE 0.9 % IV SOLN
INTRAVENOUS | Status: DC
  Filled 2013-08-27: qty 1

## 2013-08-27 MED ORDER — DEXMEDETOMIDINE HCL IN NACL 400 MCG/100ML IV SOLN
0.1000 ug/kg/h | INTRAVENOUS | Status: DC
  Filled 2013-08-27: qty 100

## 2013-08-27 MED ORDER — PLASMA-LYTE 148 IV SOLN
INTRAVENOUS | Status: AC
  Administered 2013-08-28: 09:00:00
  Filled 2013-08-27: qty 2.5

## 2013-08-27 MED ORDER — SODIUM CHLORIDE 0.9 % IV SOLN
1000.0000 mg | INTRAVENOUS | Status: AC
  Administered 2013-08-28: 1000 mg via INTRAVENOUS
  Filled 2013-08-27: qty 1000

## 2013-08-27 MED ORDER — DEXTROSE 5 % IV SOLN
750.0000 mg | INTRAVENOUS | Status: DC
  Filled 2013-08-27: qty 750

## 2013-08-27 MED ORDER — POTASSIUM CHLORIDE 2 MEQ/ML IV SOLN
80.0000 meq | INTRAVENOUS | Status: DC
  Filled 2013-08-27: qty 40

## 2013-08-27 MED ORDER — DEXTROSE 5 % IV SOLN
1.5000 g | INTRAVENOUS | Status: AC
  Administered 2013-08-28: 1.5 g via INTRAVENOUS
  Administered 2013-08-28: .75 g via INTRAVENOUS
  Filled 2013-08-27 (×2): qty 1.5

## 2013-08-27 NOTE — Progress Notes (Signed)
Spoke with Dr.Singer about NA 124--per him just make sure Dr.[name] knows

## 2013-08-27 NOTE — Pre-Procedure Instructions (Signed)
HORACE WISHON  08/27/2013   Your procedure is scheduled on:  Tues, Nov 4 @ 7:30 AM  Report to [facility] A at 5:30 AM.  Call this number if you have problems the morning of surgery: [phone]   Remember:   Do not eat food or drink liquids after midnight.   Take these medicines the morning of surgery with A SIP OF WATER: Amiodarone(Pacerone),Synthroid(Levothyroxine),Omeprazole(Prilosec),Sertraline(Zoloft),and Metoprolol(Toprol)   Do not wear jewelry, make-up or nail polish.  Do not wear lotions, powders, or perfumes. You may wear deodorant.  Do not shave 48 hours prior to surgery.   Do not bring valuables to the hospital.  [facility] is not responsible                  for any belongings or valuables.               Contacts, dentures or bridgework may not be worn into surgery.  Leave suitcase in the car. After surgery it may be brought to your room.  For patients admitted to the hospital, discharge time is determined by your                treatment team.                 Special Instructions: Shower using CHG 2 nights before surgery and the night before surgery.  If you shower the day of surgery use CHG.  Use special wash - you have one bottle of CHG for all showers.  You should use approximately 1/3 of the bottle for each shower.   Please read over the following fact sheets that you were given: Pain Booklet, Coughing and Deep Breathing, Blood Transfusion Information, MRSA Information and Surgical Site Infection Prevention

## 2013-08-27 NOTE — Progress Notes (Signed)
VASCULAR LAB PRELIMINARY  PRELIMINARY  PRELIMINARY  PRELIMINARY  Pre-op Cardiac Surgery  Carotid Findings:  Bilateral:  1-39% ICA stenosis.  Vertebral artery flow is antegrade.      Upper Extremity Right Left  Brachial Pressures 193 triphasic 185 triphasic  Radial Waveforms biphasic biphasic  Ulnar Waveforms biphasic monophasic  Palmar Arch (Allen's Test) * **   Findings:  *Right Doppler waveforms remain normal with ulnar and radial compressions.  **Left Doppler waveforms obliterate with radial and remain normal with ulnar compressions.    Lower  Extremity Right Left  Dorsalis Pedis 165 severely dampened monophasic 177 monophasic  Anterior Tibial    Posterior Tibial 181 monophasic 175 biphasic  Ankle/Brachial Indices 0.94 0.92    Findings:  ABI is within normal limits with abnormal Doppler waveforms bilaterally.   Helton Oleson, RVT 08/27/2013, 1:49 PM

## 2013-08-27 NOTE — Progress Notes (Signed)
Spoke with Levonne Spiller RN about NA 124-will send Dr.[name] a message

## 2013-08-27 NOTE — Progress Notes (Signed)
Dr.Hank [name] is cardiologist with last visit about 2wks ago  Echo report in epic from 08-21-13  Denies ever having a stress test  Medical MD is Dr.Stoneking  EKG report in epic from 08-07-13  Heart cath report in epic from 07/2013   Denies CXR in past 2wks

## 2013-08-28 ENCOUNTER — Encounter (HOSPITAL_COMMUNITY): Payer: Self-pay | Admitting: Certified Registered Nurse Anesthetist

## 2013-08-28 ENCOUNTER — Encounter (HOSPITAL_COMMUNITY)
Admission: RE | Disposition: A | Discharge status: Skilled Nursing Facility | Payer: Medicare Other | Source: Ambulatory Visit | Attending: Cardiothoracic Surgery

## 2013-08-28 ENCOUNTER — Encounter (HOSPITAL_COMMUNITY): Payer: Medicare Other | Admitting: Certified Registered Nurse Anesthetist

## 2013-08-28 ENCOUNTER — Inpatient Hospital Stay (HOSPITAL_COMMUNITY)
Admission: RE | Admit: 2013-08-28 | Discharge: 2013-09-08 | DRG: 235 | Disposition: A | Discharge status: Skilled Nursing Facility | Payer: Medicare Other | Source: Ambulatory Visit | Attending: Cardiothoracic Surgery | Admitting: Cardiothoracic Surgery

## 2013-08-28 ENCOUNTER — Inpatient Hospital Stay (HOSPITAL_COMMUNITY): Payer: Medicare Other | Admitting: Certified Registered Nurse Anesthetist

## 2013-08-28 ENCOUNTER — Inpatient Hospital Stay (HOSPITAL_COMMUNITY): Payer: Medicare Other

## 2013-08-28 DIAGNOSIS — Z951 Presence of aortocoronary bypass graft: Secondary | ICD-10-CM

## 2013-08-28 DIAGNOSIS — Z794 Long term (current) use of insulin: Secondary | ICD-10-CM

## 2013-08-28 DIAGNOSIS — M810 Age-related osteoporosis without current pathological fracture: Secondary | ICD-10-CM | POA: Diagnosis present

## 2013-08-28 DIAGNOSIS — I080 Rheumatic disorders of both mitral and aortic valves: Secondary | ICD-10-CM | POA: Diagnosis present

## 2013-08-28 DIAGNOSIS — I10 Essential (primary) hypertension: Secondary | ICD-10-CM | POA: Diagnosis present

## 2013-08-28 DIAGNOSIS — I2510 Atherosclerotic heart disease of native coronary artery without angina pectoris: Principal | ICD-10-CM | POA: Diagnosis present

## 2013-08-28 DIAGNOSIS — I5033 Acute on chronic diastolic (congestive) heart failure: Secondary | ICD-10-CM | POA: Diagnosis not present

## 2013-08-28 DIAGNOSIS — F329 Major depressive disorder, single episode, unspecified: Secondary | ICD-10-CM | POA: Diagnosis present

## 2013-08-28 DIAGNOSIS — I5032 Chronic diastolic (congestive) heart failure: Secondary | ICD-10-CM

## 2013-08-28 DIAGNOSIS — F3289 Other specified depressive episodes: Secondary | ICD-10-CM | POA: Diagnosis present

## 2013-08-28 DIAGNOSIS — D62 Acute posthemorrhagic anemia: Secondary | ICD-10-CM | POA: Diagnosis not present

## 2013-08-28 DIAGNOSIS — I200 Unstable angina: Secondary | ICD-10-CM | POA: Diagnosis present

## 2013-08-28 DIAGNOSIS — I498 Other specified cardiac arrhythmias: Secondary | ICD-10-CM | POA: Diagnosis not present

## 2013-08-28 DIAGNOSIS — G4733 Obstructive sleep apnea (adult) (pediatric): Secondary | ICD-10-CM | POA: Diagnosis present

## 2013-08-28 DIAGNOSIS — J9600 Acute respiratory failure, unspecified whether with hypoxia or hypercapnia: Secondary | ICD-10-CM | POA: Diagnosis not present

## 2013-08-28 DIAGNOSIS — I509 Heart failure, unspecified: Secondary | ICD-10-CM | POA: Diagnosis present

## 2013-08-28 DIAGNOSIS — J95821 Acute postprocedural respiratory failure: Secondary | ICD-10-CM

## 2013-08-28 DIAGNOSIS — I272 Pulmonary hypertension, unspecified: Secondary | ICD-10-CM | POA: Diagnosis present

## 2013-08-28 DIAGNOSIS — I059 Rheumatic mitral valve disease, unspecified: Secondary | ICD-10-CM

## 2013-08-28 DIAGNOSIS — I480 Paroxysmal atrial fibrillation: Secondary | ICD-10-CM | POA: Diagnosis present

## 2013-08-28 DIAGNOSIS — R933 Abnormal findings on diagnostic imaging of other parts of digestive tract: Secondary | ICD-10-CM

## 2013-08-28 DIAGNOSIS — I340 Nonrheumatic mitral (valve) insufficiency: Secondary | ICD-10-CM | POA: Diagnosis present

## 2013-08-28 DIAGNOSIS — E038 Other specified hypothyroidism: Secondary | ICD-10-CM | POA: Diagnosis present

## 2013-08-28 DIAGNOSIS — K219 Gastro-esophageal reflux disease without esophagitis: Secondary | ICD-10-CM | POA: Diagnosis present

## 2013-08-28 DIAGNOSIS — I079 Rheumatic tricuspid valve disease, unspecified: Secondary | ICD-10-CM | POA: Diagnosis present

## 2013-08-28 DIAGNOSIS — Z889 Allergy status to unspecified drugs, medicaments and biological substances status: Secondary | ICD-10-CM

## 2013-08-28 DIAGNOSIS — Z79899 Other long term (current) drug therapy: Secondary | ICD-10-CM

## 2013-08-28 DIAGNOSIS — R0609 Other forms of dyspnea: Secondary | ICD-10-CM

## 2013-08-28 DIAGNOSIS — I2789 Other specified pulmonary heart diseases: Secondary | ICD-10-CM | POA: Diagnosis present

## 2013-08-28 DIAGNOSIS — I4891 Unspecified atrial fibrillation: Secondary | ICD-10-CM | POA: Diagnosis present

## 2013-08-28 DIAGNOSIS — Z7982 Long term (current) use of aspirin: Secondary | ICD-10-CM

## 2013-08-28 DIAGNOSIS — Z881 Allergy status to other antibiotic agents status: Secondary | ICD-10-CM

## 2013-08-28 DIAGNOSIS — G2581 Restless legs syndrome: Secondary | ICD-10-CM | POA: Diagnosis present

## 2013-08-28 DIAGNOSIS — K900 Celiac disease: Secondary | ICD-10-CM | POA: Diagnosis present

## 2013-08-28 DIAGNOSIS — E041 Nontoxic single thyroid nodule: Secondary | ICD-10-CM | POA: Diagnosis present

## 2013-08-28 HISTORY — PX: INTRAOPERATIVE TRANSESOPHAGEAL ECHOCARDIOGRAM: SHX5062

## 2013-08-28 HISTORY — PX: CORONARY ARTERY BYPASS GRAFT: SHX141

## 2013-08-28 LAB — POCT I-STAT 3, ART BLOOD GAS (G3+)
Acid-base deficit: 1 mmol/L (ref 0.0–2.0)
Acid-base deficit: 3 mmol/L — ABNORMAL HIGH (ref 0.0–2.0)
Acid-base deficit: 3 mmol/L — ABNORMAL HIGH (ref 0.0–2.0)
Acid-base deficit: 5 mmol/L — ABNORMAL HIGH (ref 0.0–2.0)
Bicarbonate: 22.7 mEq/L (ref 20.0–24.0)
Bicarbonate: 19.5 mEq/L — ABNORMAL LOW (ref 20.0–24.0)
Bicarbonate: 21.8 mEq/L (ref 20.0–24.0)
Bicarbonate: 21.8 mEq/L (ref 20.0–24.0)
O2 Saturation: 100 %
O2 Saturation: 96 %
O2 Saturation: 99 %
O2 Saturation: 99 %
Patient temperature: 34.5
Patient temperature: 35.7
Patient temperature: 36.5
TCO2: 24 mmol/L (ref 0–100)
TCO2: 20 mmol/L (ref 0–100)
TCO2: 23 mmol/L (ref 0–100)
TCO2: 23 mmol/L (ref 0–100)
pCO2 arterial: 34 mmHg — ABNORMAL LOW (ref 35.0–45.0)
pCO2 arterial: 23.2 mmHg — ABNORMAL LOW (ref 35.0–45.0)
pCO2 arterial: 37.2 mmHg (ref 35.0–45.0)
pCO2 arterial: 43.8 mmHg (ref 35.0–45.0)
pH, Arterial: 7.434 (ref 7.350–7.450)
pH, Arterial: 7.524 — ABNORMAL HIGH (ref 7.350–7.450)
pH, Arterial: 7.371 (ref 7.350–7.450)
pH, Arterial: 7.303 — ABNORMAL LOW (ref 7.350–7.450)
pO2, Arterial: 266 mmHg — ABNORMAL HIGH (ref 80.0–100.0)
pO2, Arterial: 60 mmHg — ABNORMAL LOW (ref 80.0–100.0)
pO2, Arterial: 154 mmHg — ABNORMAL HIGH (ref 80.0–100.0)
pO2, Arterial: 156 mmHg — ABNORMAL HIGH (ref 80.0–100.0)

## 2013-08-28 LAB — GLUCOSE, CAPILLARY
Glucose-Capillary: 100 mg/dL — ABNORMAL HIGH (ref 70–99)
Glucose-Capillary: 89 mg/dL (ref 70–99)
Glucose-Capillary: 94 mg/dL (ref 70–99)
Glucose-Capillary: 97 mg/dL (ref 70–99)
Glucose-Capillary: 125 mg/dL — ABNORMAL HIGH (ref 70–99)

## 2013-08-28 LAB — POCT I-STAT 4, (NA,K, GLUC, HGB,HCT)
Glucose, Bld: 125 mg/dL — ABNORMAL HIGH (ref 70–99)
Glucose, Bld: 104 mg/dL — ABNORMAL HIGH (ref 70–99)
Glucose, Bld: 80 mg/dL (ref 70–99)
Glucose, Bld: 82 mg/dL (ref 70–99)
Glucose, Bld: 85 mg/dL (ref 70–99)
Glucose, Bld: 98 mg/dL (ref 70–99)
HCT: 35 % — ABNORMAL LOW (ref 36.0–46.0)
HCT: 24 % — ABNORMAL LOW (ref 36.0–46.0)
HCT: 25 % — ABNORMAL LOW (ref 36.0–46.0)
HCT: 35 % — ABNORMAL LOW (ref 36.0–46.0)
HCT: 25 % — ABNORMAL LOW (ref 36.0–46.0)
HCT: 31 % — ABNORMAL LOW (ref 36.0–46.0)
Hemoglobin: 11.9 g/dL — ABNORMAL LOW (ref 12.0–15.0)
Hemoglobin: 11.9 g/dL — ABNORMAL LOW (ref 12.0–15.0)
Hemoglobin: 8.2 g/dL — ABNORMAL LOW (ref 12.0–15.0)
Hemoglobin: 8.5 g/dL — ABNORMAL LOW (ref 12.0–15.0)
Hemoglobin: 8.5 g/dL — ABNORMAL LOW (ref 12.0–15.0)
Hemoglobin: 10.5 g/dL — ABNORMAL LOW (ref 12.0–15.0)
Potassium: 3.7 mEq/L (ref 3.5–5.1)
Potassium: 3.4 mEq/L — ABNORMAL LOW (ref 3.5–5.1)
Potassium: 4.6 mEq/L (ref 3.5–5.1)
Potassium: 4.9 mEq/L (ref 3.5–5.1)
Potassium: 4.1 mEq/L (ref 3.5–5.1)
Potassium: 3.5 mEq/L (ref 3.5–5.1)
Sodium: 131 mEq/L — ABNORMAL LOW (ref 135–145)
Sodium: 132 mEq/L — ABNORMAL LOW (ref 135–145)
Sodium: 135 mEq/L (ref 135–145)
Sodium: 137 mEq/L (ref 135–145)
Sodium: 133 mEq/L — ABNORMAL LOW (ref 135–145)
Sodium: 134 mEq/L — ABNORMAL LOW (ref 135–145)

## 2013-08-28 LAB — POCT I-STAT, CHEM 8
BUN: 5 mg/dL — ABNORMAL LOW (ref 6–23)
Calcium, Ion: 1.13 mmol/L (ref 1.13–1.30)
Chloride: 115 mEq/L — ABNORMAL HIGH (ref 96–112)
Creatinine, Ser: 0.8 mg/dL (ref 0.50–1.10)
Glucose, Bld: 149 mg/dL — ABNORMAL HIGH (ref 70–99)
HCT: 28 % — ABNORMAL LOW (ref 36.0–46.0)
Hemoglobin: 9.5 g/dL — ABNORMAL LOW (ref 12.0–15.0)
Potassium: 3.8 mEq/L (ref 3.5–5.1)
Sodium: 135 mEq/L (ref 135–145)
TCO2: 22 mmol/L (ref 0–100)

## 2013-08-28 LAB — CBC
HCT: 30 % — ABNORMAL LOW (ref 36.0–46.0)
Hemoglobin: 10.6 g/dL — ABNORMAL LOW (ref 12.0–15.0)
Hemoglobin: 10.3 g/dL — ABNORMAL LOW (ref 12.0–15.0)
MCH: 32.1 pg (ref 26.0–34.0)
MCH: 31.7 pg (ref 26.0–34.0)
MCHC: 34.3 g/dL (ref 30.0–36.0)
MCV: 92.3 fL (ref 78.0–100.0)
Platelets: 154 10*3/uL (ref 150–400)
RBC: 3.3 MIL/uL — ABNORMAL LOW (ref 3.87–5.11)
RBC: 3.25 MIL/uL — ABNORMAL LOW (ref 3.87–5.11)
RDW: 13.8 % (ref 11.5–15.5)
RDW: 14.1 % (ref 11.5–15.5)
WBC: 10.4 10*3/uL (ref 4.0–10.5)

## 2013-08-28 LAB — HEMOGLOBIN AND HEMATOCRIT, BLOOD
HCT: 24.2 % — ABNORMAL LOW (ref 36.0–46.0)
Hemoglobin: 8.8 g/dL — ABNORMAL LOW (ref 12.0–15.0)

## 2013-08-28 LAB — CREATININE, SERUM
Creatinine, Ser: 0.72 mg/dL (ref 0.50–1.10)
GFR calc Af Amer: 87 mL/min — ABNORMAL LOW (ref >90)
GFR calc non Af Amer: 75 mL/min — ABNORMAL LOW (ref >90)

## 2013-08-28 LAB — CARBOXYHEMOGLOBIN
Carboxyhemoglobin: 0.9 % (ref 0.5–1.5)
Methemoglobin: 0.6 % (ref 0.0–1.5)
O2 Saturation: 56.2 %
Total hemoglobin: 9.4 g/dL — ABNORMAL LOW (ref 12.0–16.0)

## 2013-08-28 LAB — PROTIME-INR
INR: 1.64 — ABNORMAL HIGH (ref 0.00–1.49)
Prothrombin Time: 19 seconds — ABNORMAL HIGH (ref 11.6–15.2)

## 2013-08-28 LAB — APTT: aPTT: 37 seconds (ref 24–37)

## 2013-08-28 LAB — PLATELET COUNT: Platelets: 149 10*3/uL — ABNORMAL LOW (ref 150–400)

## 2013-08-28 LAB — MAGNESIUM: Magnesium: 3.6 mg/dL — ABNORMAL HIGH (ref 1.5–2.5)

## 2013-08-28 SURGERY — CORONARY ARTERY BYPASS GRAFTING (CABG)
Anesthesia: General | Site: Chest | Wound class: Clean

## 2013-08-28 MED ORDER — ALBUMIN HUMAN 5 % IV SOLN
12.5000 g | Freq: Once | INTRAVENOUS | Status: AC
  Administered 2013-08-28: 12.5 g via INTRAVENOUS
  Filled 2013-08-28: qty 250

## 2013-08-28 MED ORDER — PHENYLEPHRINE HCL 10 MG/ML IJ SOLN
0.0000 ug/min | INTRAVENOUS | Status: DC
  Administered 2013-08-30: 25 ug/min via INTRAVENOUS
  Filled 2013-08-28 (×3): qty 2

## 2013-08-28 MED ORDER — MILRINONE IN DEXTROSE 20 MG/100ML IV SOLN
0.3000 ug/kg/min | INTRAVENOUS | Status: DC
Start: 2013-08-28 — End: 2013-08-30
  Administered 2013-08-28 – 2013-08-30 (×3): 0.3 ug/kg/min via INTRAVENOUS
  Filled 2013-08-28 (×3): qty 100

## 2013-08-28 MED ORDER — LACTATED RINGERS IV SOLN
INTRAVENOUS | Status: DC | PRN
  Administered 2013-08-28 (×2): via INTRAVENOUS

## 2013-08-28 MED ORDER — ALBUMIN HUMAN 5 % IV SOLN
250.0000 mL | INTRAVENOUS | Status: AC | PRN
  Administered 2013-08-28 (×4): 250 mL via INTRAVENOUS
  Filled 2013-08-28 (×2): qty 250

## 2013-08-28 MED ORDER — MIDAZOLAM HCL 2 MG/2ML IJ SOLN
2.0000 mg | INTRAMUSCULAR | Status: DC | PRN
  Administered 2013-08-29: 2 mg via INTRAVENOUS
  Filled 2013-08-28: qty 2

## 2013-08-28 MED ORDER — LEVOFLOXACIN IN D5W 750 MG/150ML IV SOLN: 750.0000 mg | INTRAVENOUS | Status: DC

## 2013-08-28 MED ORDER — POTASSIUM CHLORIDE 10 MEQ/50ML IV SOLN
10.0000 meq | INTRAVENOUS | Status: DC | PRN
  Administered 2013-08-28: 10 meq via INTRAVENOUS

## 2013-08-28 MED ORDER — HEPARIN SODIUM (PORCINE) 1000 UNIT/ML IJ SOLN
INTRAMUSCULAR | Status: DC | PRN
  Administered 2013-08-28: 13 mL via INTRAVENOUS

## 2013-08-28 MED ORDER — SODIUM CHLORIDE 0.9 % IV SOLN: 250.0000 mL | INTRAVENOUS | Status: DC

## 2013-08-28 MED ORDER — AMIODARONE HCL 200 MG PO TABS
200.0000 mg | ORAL_TABLET | Freq: Every day | ORAL | Status: DC
  Administered 2013-08-29 – 2013-09-03 (×6): 200 mg via ORAL
  Filled 2013-08-28 (×7): qty 1

## 2013-08-28 MED ORDER — ASPIRIN 81 MG PO CHEW
324.0000 mg | CHEWABLE_TABLET | Freq: Every day | ORAL | Status: DC
  Administered 2013-08-29 – 2013-09-08 (×3): 324 mg
  Filled 2013-08-28 (×4): qty 4

## 2013-08-28 MED ORDER — ACETAMINOPHEN 500 MG PO TABS
1000.0000 mg | ORAL_TABLET | Freq: Four times a day (QID) | ORAL | Status: AC
  Administered 2013-08-31 – 2013-09-02 (×8): 1000 mg via ORAL
  Filled 2013-08-28 (×19): qty 2

## 2013-08-28 MED ORDER — GLYCOPYRROLATE 0.2 MG/ML IJ SOLN
INTRAMUSCULAR | Status: DC | PRN
  Administered 2013-08-28 (×2): 0.2 mg via INTRAVENOUS

## 2013-08-28 MED ORDER — INSULIN ASPART 100 UNIT/ML ~~LOC~~ SOLN
0.0000 [IU] | SUBCUTANEOUS | Status: DC
  Administered 2013-08-28 – 2013-08-31 (×5): 2 [IU] via SUBCUTANEOUS

## 2013-08-28 MED ORDER — LACTATED RINGERS IV SOLN
INTRAVENOUS | Status: DC | PRN
  Administered 2013-08-28: 07:00:00 via INTRAVENOUS

## 2013-08-28 MED ORDER — METOPROLOL TARTRATE 12.5 MG HALF TABLET: 12.5000 mg | ORAL_TABLET | Freq: Once | ORAL | Status: DC

## 2013-08-28 MED ORDER — FAMOTIDINE IN NACL 20-0.9 MG/50ML-% IV SOLN
20.0000 mg | Freq: Two times a day (BID) | INTRAVENOUS | Status: AC
  Administered 2013-08-28 (×2): 20 mg via INTRAVENOUS
  Filled 2013-08-28: qty 50

## 2013-08-28 MED ORDER — SERTRALINE HCL 50 MG PO TABS
50.0000 mg | ORAL_TABLET | ORAL | Status: DC
  Administered 2013-08-29 – 2013-09-07 (×5): 50 mg via ORAL
  Filled 2013-08-28 (×6): qty 1

## 2013-08-28 MED ORDER — LIDOCAINE HCL (CARDIAC) 20 MG/ML IV SOLN
INTRAVENOUS | Status: DC | PRN
  Administered 2013-08-28: 80 mg via INTRAVENOUS

## 2013-08-28 MED ORDER — MORPHINE SULFATE 2 MG/ML IJ SOLN: 1.0000 mg | INTRAMUSCULAR | Status: AC | PRN

## 2013-08-28 MED ORDER — ASPIRIN EC 325 MG PO TBEC
325.0000 mg | DELAYED_RELEASE_TABLET | Freq: Every day | ORAL | Status: DC
  Administered 2013-08-31 – 2013-09-07 (×8): 325 mg via ORAL
  Filled 2013-08-28 (×11): qty 1

## 2013-08-28 MED ORDER — DEXMEDETOMIDINE HCL IN NACL 200 MCG/50ML IV SOLN
0.1000 ug/kg/h | INTRAVENOUS | Status: DC
  Administered 2013-08-28: 0.7 ug/kg/h via INTRAVENOUS

## 2013-08-28 MED ORDER — BISACODYL 10 MG RE SUPP
10.0000 mg | Freq: Every day | RECTAL | Status: DC
  Administered 2013-08-30: 10 mg via RECTAL
  Filled 2013-08-28: qty 1

## 2013-08-28 MED ORDER — SODIUM CHLORIDE 0.9 % IJ SOLN
OROMUCOSAL | Status: DC | PRN
  Administered 2013-08-28 (×3): via TOPICAL

## 2013-08-28 MED ORDER — MILRINONE IN DEXTROSE 20 MG/100ML IV SOLN
0.1250 ug/kg/min | INTRAVENOUS | Status: DC
  Filled 2013-08-28: qty 100

## 2013-08-28 MED ORDER — ROCURONIUM BROMIDE 100 MG/10ML IV SOLN
INTRAVENOUS | Status: DC | PRN
  Administered 2013-08-28 (×2): 50 mg via INTRAVENOUS

## 2013-08-28 MED ORDER — ALBUMIN HUMAN 5 % IV SOLN
INTRAVENOUS | Status: DC | PRN
  Administered 2013-08-28: 11:00:00 via INTRAVENOUS

## 2013-08-28 MED ORDER — PHENYLEPHRINE HCL 10 MG/ML IJ SOLN
10.0000 mg | INTRAVENOUS | Status: DC | PRN
  Administered 2013-08-28: 10 ug/min via INTRAVENOUS

## 2013-08-28 MED ORDER — METOPROLOL TARTRATE 25 MG/10 ML ORAL SUSPENSION
12.5000 mg | Freq: Two times a day (BID) | ORAL | Status: DC
  Administered 2013-08-29 – 2013-09-02 (×2): 12.5 mg
  Filled 2013-08-28 (×15): qty 5

## 2013-08-28 MED ORDER — VANCOMYCIN HCL IN DEXTROSE 1-5 GM/200ML-% IV SOLN
1000.0000 mg | Freq: Once | INTRAVENOUS | Status: AC
  Administered 2013-08-28: 1000 mg via INTRAVENOUS
  Filled 2013-08-28: qty 200

## 2013-08-28 MED ORDER — SODIUM CHLORIDE 0.9 % IV SOLN
INTRAVENOUS | Status: DC
  Administered 2013-08-28: 1 mL via INTRAVENOUS

## 2013-08-28 MED ORDER — NITROGLYCERIN IN D5W 200-5 MCG/ML-% IV SOLN
INTRAVENOUS | Status: DC | PRN
  Administered 2013-08-28: 5 ug/min via INTRAVENOUS

## 2013-08-28 MED ORDER — POTASSIUM CHLORIDE 10 MEQ/50ML IV SOLN
10.0000 meq | INTRAVENOUS | Status: AC
  Administered 2013-08-28 (×3): 10 meq via INTRAVENOUS

## 2013-08-28 MED ORDER — BISACODYL 5 MG PO TBEC
10.0000 mg | DELAYED_RELEASE_TABLET | Freq: Every day | ORAL | Status: DC
  Administered 2013-08-29 – 2013-08-31 (×2): 10 mg via ORAL
  Administered 2013-09-01: 5 mg via ORAL
  Administered 2013-09-02: 10 mg via ORAL
  Filled 2013-08-28 (×5): qty 2

## 2013-08-28 MED ORDER — PANTOPRAZOLE SODIUM 40 MG PO TBEC
40.0000 mg | DELAYED_RELEASE_TABLET | Freq: Every day | ORAL | Status: DC
  Administered 2013-08-31 – 2013-09-08 (×9): 40 mg via ORAL
  Filled 2013-08-28 (×10): qty 1

## 2013-08-28 MED ORDER — SODIUM CHLORIDE 0.9 % IV SOLN
200.0000 ug | INTRAVENOUS | Status: DC | PRN
  Administered 2013-08-28: .3 ug/kg/h via INTRAVENOUS

## 2013-08-28 MED ORDER — 0.9 % SODIUM CHLORIDE (POUR BTL) OPTIME
TOPICAL | Status: DC | PRN
  Administered 2013-08-28: 5000 mL

## 2013-08-28 MED ORDER — SODIUM CHLORIDE 0.9 % IV SOLN
100.0000 [IU] | INTRAVENOUS | Status: DC | PRN
  Administered 2013-08-28: 2 [IU]/h via INTRAVENOUS

## 2013-08-28 MED ORDER — SODIUM CHLORIDE 0.9 % IJ SOLN
3.0000 mL | Freq: Two times a day (BID) | INTRAMUSCULAR | Status: DC
  Administered 2013-08-29: 10 mL via INTRAVENOUS
  Administered 2013-08-30: 3 mL via INTRAVENOUS
  Administered 2013-08-30 – 2013-08-31 (×2): 6 mL via INTRAVENOUS
  Administered 2013-08-31 – 2013-09-02 (×3): 3 mL via INTRAVENOUS

## 2013-08-28 MED ORDER — FENTANYL CITRATE 0.05 MG/ML IJ SOLN
INTRAMUSCULAR | Status: DC | PRN
  Administered 2013-08-28: 50 ug via INTRAVENOUS
  Administered 2013-08-28 (×3): 100 ug via INTRAVENOUS
  Administered 2013-08-28: 50 ug via INTRAVENOUS
  Administered 2013-08-28 (×2): 100 ug via INTRAVENOUS
  Administered 2013-08-28: 50 ug via INTRAVENOUS
  Administered 2013-08-28: 150 ug via INTRAVENOUS
  Administered 2013-08-28: 250 ug via INTRAVENOUS
  Administered 2013-08-28 (×2): 100 ug via INTRAVENOUS
  Administered 2013-08-28: 50 ug via INTRAVENOUS
  Administered 2013-08-28 (×2): 100 ug via INTRAVENOUS

## 2013-08-28 MED ORDER — TRAMADOL HCL 50 MG PO TABS
50.0000 mg | ORAL_TABLET | ORAL | Status: DC | PRN
  Administered 2013-09-05: 50 mg via ORAL
  Filled 2013-08-28: qty 1

## 2013-08-28 MED ORDER — HEMOSTATIC AGENTS (NO CHARGE) OPTIME
TOPICAL | Status: DC | PRN
  Administered 2013-08-28: 1 via TOPICAL

## 2013-08-28 MED ORDER — LACTATED RINGERS IV SOLN
INTRAVENOUS | Status: DC
  Administered 2013-08-28: 1 mL via INTRAVENOUS
  Administered 2013-08-31: 20 mL/h via INTRAVENOUS
  Administered 2013-09-01 – 2013-09-02 (×2): via INTRAVENOUS

## 2013-08-28 MED ORDER — LEVOTHYROXINE SODIUM 50 MCG PO TABS
50.0000 ug | ORAL_TABLET | Freq: Every day | ORAL | Status: DC
  Administered 2013-08-29 – 2013-09-04 (×7): 50 ug via ORAL
  Filled 2013-08-28 (×8): qty 1

## 2013-08-28 MED ORDER — ONDANSETRON HCL 4 MG/2ML IJ SOLN
4.0000 mg | Freq: Four times a day (QID) | INTRAMUSCULAR | Status: DC | PRN
  Administered 2013-08-29 – 2013-08-31 (×8): 4 mg via INTRAVENOUS
  Filled 2013-08-28 (×8): qty 2

## 2013-08-28 MED ORDER — ACETAMINOPHEN 160 MG/5ML PO SOLN: 650.0000 mg | Freq: Once | ORAL | Status: AC

## 2013-08-28 MED ORDER — LACTATED RINGERS IV SOLN: 500.0000 mL | Freq: Once | INTRAVENOUS | Status: AC | PRN

## 2013-08-28 MED ORDER — METOCLOPRAMIDE HCL 5 MG/ML IJ SOLN
10.0000 mg | Freq: Four times a day (QID) | INTRAMUSCULAR | Status: DC
  Filled 2013-08-28 (×4): qty 2

## 2013-08-28 MED ORDER — SODIUM CHLORIDE 0.45 % IV SOLN
INTRAVENOUS | Status: DC
  Administered 2013-08-28: 1 mL via INTRAVENOUS
  Administered 2013-08-30: 20 mL/h via INTRAVENOUS

## 2013-08-28 MED ORDER — MAGNESIUM SULFATE 40 MG/ML IJ SOLN
4.0000 g | Freq: Once | INTRAMUSCULAR | Status: AC
  Administered 2013-08-28: 4 g via INTRAVENOUS
  Filled 2013-08-28: qty 100

## 2013-08-28 MED ORDER — MIDAZOLAM HCL 5 MG/5ML IJ SOLN
INTRAMUSCULAR | Status: DC | PRN
  Administered 2013-08-28: 4 mg via INTRAVENOUS
  Administered 2013-08-28: 2 mg via INTRAVENOUS
  Administered 2013-08-28: 4 mg via INTRAVENOUS

## 2013-08-28 MED ORDER — SODIUM CHLORIDE 0.9 % IV SOLN
10.0000 g | INTRAVENOUS | Status: DC | PRN
  Administered 2013-08-28: 5 g/h via INTRAVENOUS

## 2013-08-28 MED ORDER — DEXTROSE 5 % IV SOLN
1.5000 g | Freq: Two times a day (BID) | INTRAVENOUS | Status: AC
  Administered 2013-08-28 – 2013-08-30 (×4): 1.5 g via INTRAVENOUS
  Filled 2013-08-28 (×4): qty 1.5

## 2013-08-28 MED ORDER — MILRINONE IN DEXTROSE 20 MG/100ML IV SOLN
INTRAVENOUS | Status: DC | PRN
  Administered 2013-08-28: .3 ug/kg/min via INTRAVENOUS

## 2013-08-28 MED ORDER — VECURONIUM BROMIDE 10 MG IV SOLR
INTRAVENOUS | Status: DC | PRN
  Administered 2013-08-28 (×2): 5 mg via INTRAVENOUS
  Administered 2013-08-28: 10 mg via INTRAVENOUS

## 2013-08-28 MED ORDER — MORPHINE SULFATE 2 MG/ML IJ SOLN
2.0000 mg | INTRAMUSCULAR | Status: DC | PRN
  Administered 2013-08-30: 1 mg via INTRAVENOUS
  Filled 2013-08-28 (×2): qty 1

## 2013-08-28 MED ORDER — PROPOFOL 10 MG/ML IV BOLUS
INTRAVENOUS | Status: DC | PRN
  Administered 2013-08-28: 100 mg via INTRAVENOUS

## 2013-08-28 MED ORDER — METOCLOPRAMIDE HCL 5 MG/ML IJ SOLN
10.0000 mg | Freq: Four times a day (QID) | INTRAMUSCULAR | Status: AC
  Administered 2013-08-28 – 2013-08-29 (×4): 10 mg via INTRAVENOUS
  Filled 2013-08-28 (×4): qty 2

## 2013-08-28 MED ORDER — SODIUM CHLORIDE 0.9 % IJ SOLN: 3.0000 mL | INTRAMUSCULAR | Status: DC | PRN

## 2013-08-28 MED ORDER — NITROGLYCERIN IN D5W 200-5 MCG/ML-% IV SOLN
0.0000 ug/min | INTRAVENOUS | Status: DC
  Administered 2013-08-28: 10 ug/min via INTRAVENOUS

## 2013-08-28 MED ORDER — ACETAMINOPHEN 160 MG/5ML PO SOLN
1000.0000 mg | Freq: Four times a day (QID) | ORAL | Status: AC
  Administered 2013-08-29 – 2013-08-31 (×6): 1000 mg
  Filled 2013-08-28 (×6): qty 40.6

## 2013-08-28 MED ORDER — METOPROLOL TARTRATE 1 MG/ML IV SOLN
2.5000 mg | INTRAVENOUS | Status: DC | PRN
  Administered 2013-08-30: 2.5 mg via INTRAVENOUS
  Filled 2013-08-28: qty 5

## 2013-08-28 MED ORDER — ACETAMINOPHEN 650 MG RE SUPP
650.0000 mg | Freq: Once | RECTAL | Status: AC
  Administered 2013-08-28: 650 mg via RECTAL
  Filled 2013-08-28: qty 1

## 2013-08-28 MED ORDER — INSULIN REGULAR BOLUS VIA INFUSION
0.0000 [IU] | Freq: Three times a day (TID) | INTRAVENOUS | Status: DC
  Filled 2013-08-28: qty 10

## 2013-08-28 MED ORDER — SODIUM CHLORIDE 0.9 % IV SOLN
INTRAVENOUS | Status: DC
  Administered 2013-08-28: 0.4 [IU]/h via INTRAVENOUS
  Filled 2013-08-28: qty 1

## 2013-08-28 MED ORDER — DOCUSATE SODIUM 100 MG PO CAPS
200.0000 mg | ORAL_CAPSULE | Freq: Every day | ORAL | Status: DC
  Administered 2013-08-31 – 2013-09-04 (×5): 200 mg via ORAL
  Filled 2013-08-28 (×6): qty 2

## 2013-08-28 MED ORDER — PROTAMINE SULFATE 10 MG/ML IV SOLN
INTRAVENOUS | Status: DC | PRN
  Administered 2013-08-28: 100 mg via INTRAVENOUS

## 2013-08-28 MED ORDER — METOPROLOL TARTRATE 12.5 MG HALF TABLET
12.5000 mg | ORAL_TABLET | Freq: Two times a day (BID) | ORAL | Status: DC
  Administered 2013-08-30 – 2013-09-03 (×7): 12.5 mg via ORAL
  Filled 2013-08-28 (×15): qty 1

## 2013-08-28 SURGICAL SUPPLY — 143 items
ADAPTER CARDIO PERF ANTE/RETRO (ADAPTER) ×3 IMPLANT
ADH SKN CLS APL DERMABOND .7 (GAUZE/BANDAGES/DRESSINGS) ×2
ADPR PRFSN 84XANTGRD RTRGD (ADAPTER) ×2
ATRICLIP EXCLUSION 40 STD HAND (Clip) ×1 IMPLANT
ATTRACTOMAT 16X20 MAGNETIC DRP (DRAPES) ×3 IMPLANT
BAG DECANTER FOR FLEXI CONT (MISCELLANEOUS) ×3 IMPLANT
BANDAGE ELASTIC 4 VELCRO ST LF (GAUZE/BANDAGES/DRESSINGS) ×1 IMPLANT
BANDAGE ELASTIC 6 VELCRO ST LF (GAUZE/BANDAGES/DRESSINGS) ×1 IMPLANT
BANDAGE GAUZE ELAST BULKY 4 IN (GAUZE/BANDAGES/DRESSINGS) ×1 IMPLANT
BLADE STERNUM SYSTEM 6 (BLADE) ×3 IMPLANT
BLADE SURG 11 STRL SS (BLADE) ×1 IMPLANT
BLADE SURG 15 STRL LF DISP TIS (BLADE) ×2 IMPLANT
BLADE SURG 15 STRL SS (BLADE) ×6
BLADE SURG ROTATE 9660 (MISCELLANEOUS) IMPLANT
CANISTER SUCTION 2500CC (MISCELLANEOUS) ×3 IMPLANT
CANN PRFSN .5XCNCT 15X34-48 (MISCELLANEOUS) ×2
CANNULA AORTIC HI-FLOW 6.5M20F (CANNULA) ×3 IMPLANT
CANNULA ARTERIAL NVNT 3/8 22FR (MISCELLANEOUS) IMPLANT
CANNULA GUNDRY RCSP 15FR (MISCELLANEOUS) ×1 IMPLANT
CANNULA PRFSN .5XCNCT 15X34-48 (MISCELLANEOUS) ×2 IMPLANT
CANNULA VEN 1 STAGE STR 66236 (MISCELLANEOUS) IMPLANT
CANNULA VEN 2 STAGE (MISCELLANEOUS) ×4 IMPLANT
CATH CPB KIT GERHARDT (MISCELLANEOUS) ×3 IMPLANT
CATH HEART VENT LEFT (CATHETERS) ×2 IMPLANT
CATH ROBINSON RED A/P 18FR (CATHETERS) IMPLANT
CATH THORACIC 28FR (CATHETERS) ×3 IMPLANT
CATH THORACIC 36FR (CATHETERS) IMPLANT
CATH THORACIC 36FR RT ANG (CATHETERS) IMPLANT
CLIP FOGARTY SPRING 6M (CLIP) IMPLANT
CLIP TI MEDIUM 24 (CLIP) IMPLANT
CLIP TI WIDE RED SMALL 24 (CLIP) IMPLANT
CONN 1/2X1/2X1/2  BEN (MISCELLANEOUS) ×1
CONN 1/2X1/2X1/2 BEN (MISCELLANEOUS) ×2 IMPLANT
CONN 1/2X3/8X3/8 Y GISH (MISCELLANEOUS) ×1 IMPLANT
CONN 3/8X1/2 ST GISH (MISCELLANEOUS) ×6 IMPLANT
CONN Y 3/8X3/8X3/8  BEN (MISCELLANEOUS)
CONN Y 3/8X3/8X3/8 BEN (MISCELLANEOUS) IMPLANT
COVER SURGICAL LIGHT HANDLE (MISCELLANEOUS) ×6 IMPLANT
CRADLE DONUT ADULT HEAD (MISCELLANEOUS) ×3 IMPLANT
DERMABOND ADVANCED (GAUZE/BANDAGES/DRESSINGS) ×1
DERMABOND ADVANCED .7 DNX12 (GAUZE/BANDAGES/DRESSINGS) IMPLANT
DRAIN CHANNEL 28F RND 3/8 FF (WOUND CARE) ×3 IMPLANT
DRAPE CARDIOVASCULAR INCISE (DRAPES) ×3
DRAPE SLUSH/WARMER DISC (DRAPES) ×3 IMPLANT
DRAPE SRG 135X102X78XABS (DRAPES) ×2 IMPLANT
DRSG AQUACEL AG ADV 3.5X14 (GAUZE/BANDAGES/DRESSINGS) ×7 IMPLANT
DRSG COVADERM 4X14 (GAUZE/BANDAGES/DRESSINGS) ×3 IMPLANT
ELECT BLADE 4.0 EZ CLEAN MEGAD (MISCELLANEOUS) ×3
ELECT CAUTERY BLADE 6.4 (BLADE) ×1 IMPLANT
ELECT REM PT RETURN 9FT ADLT (ELECTROSURGICAL) ×6
ELECTRODE BLDE 4.0 EZ CLN MEGD (MISCELLANEOUS) ×2 IMPLANT
ELECTRODE REM PT RTRN 9FT ADLT (ELECTROSURGICAL) ×4 IMPLANT
GLOVE BIO SURGEON STRL SZ 6 (GLOVE) IMPLANT
GLOVE BIO SURGEON STRL SZ 6.5 (GLOVE) ×13 IMPLANT
GLOVE BIO SURGEON STRL SZ7 (GLOVE) IMPLANT
GLOVE BIO SURGEON STRL SZ7.5 (GLOVE) IMPLANT
GLOVE BIOGEL PI IND STRL 6 (GLOVE) IMPLANT
GLOVE BIOGEL PI IND STRL 6.5 (GLOVE) IMPLANT
GLOVE BIOGEL PI IND STRL 7.0 (GLOVE) IMPLANT
GLOVE BIOGEL PI INDICATOR 6 (GLOVE)
GLOVE BIOGEL PI INDICATOR 6.5 (GLOVE) ×4
GLOVE BIOGEL PI INDICATOR 7.0 (GLOVE) ×2
GOWN STRL NON-REIN LRG LVL3 (GOWN DISPOSABLE) ×12 IMPLANT
HEMOSTAT POWDER SURGIFOAM 1G (HEMOSTASIS) ×9 IMPLANT
HEMOSTAT SURGICEL 2X14 (HEMOSTASIS) ×3 IMPLANT
INSERT FOGARTY 61MM (MISCELLANEOUS) IMPLANT
INSERT FOGARTY XLG (MISCELLANEOUS) IMPLANT
KIT BASIN OR (CUSTOM PROCEDURE TRAY) ×3 IMPLANT
KIT ROOM TURNOVER OR (KITS) ×3 IMPLANT
KIT SUCTION CATH 14FR (SUCTIONS) ×7 IMPLANT
KIT VASOVIEW W/TROCAR VH 2000 (KITS) ×3 IMPLANT
LEAD PACING MYOCARDI (MISCELLANEOUS) ×3 IMPLANT
LINE VENT (MISCELLANEOUS) ×2 IMPLANT
MARKER GRAFT CORONARY BYPASS (MISCELLANEOUS) ×9 IMPLANT
NS IRRIG 1000ML POUR BTL (IV SOLUTION) ×15 IMPLANT
PACK OPEN HEART (CUSTOM PROCEDURE TRAY) ×3 IMPLANT
PAD ARMBOARD 7.5X6 YLW CONV (MISCELLANEOUS) ×6 IMPLANT
PAD ELECT DEFIB RADIOL ZOLL (MISCELLANEOUS) ×3 IMPLANT
PENCIL BUTTON HOLSTER BLD 10FT (ELECTRODE) ×3 IMPLANT
PUNCH AORTIC ROTATE 4.0MM (MISCELLANEOUS) IMPLANT
PUNCH AORTIC ROTATE 4.5MM 8IN (MISCELLANEOUS) IMPLANT
PUNCH AORTIC ROTATE 5MM 8IN (MISCELLANEOUS) IMPLANT
SET CARDIOPLEGIA MPS 5001102 (MISCELLANEOUS) ×1 IMPLANT
SOLUTION ANTI FOG 6CC (MISCELLANEOUS) IMPLANT
SPONGE GAUZE 4X4 12PLY (GAUZE/BANDAGES/DRESSINGS) ×6 IMPLANT
SPONGE LAP 18X18 X RAY DECT (DISPOSABLE) ×2 IMPLANT
SPONGE LAP 4X18 X RAY DECT (DISPOSABLE) IMPLANT
SUCKER WEIGHTED FLEX (MISCELLANEOUS) ×3 IMPLANT
SUT BONE WAX W31G (SUTURE) ×3 IMPLANT
SUT ETHIBOND 2 0 SH (SUTURE) ×18 IMPLANT
SUT ETHIBOND 2 0 SH 36X2 (SUTURE) ×8 IMPLANT
SUT ETHIBOND 2 0 V4 (SUTURE) IMPLANT
SUT ETHIBOND 2 0V4 GREEN (SUTURE) IMPLANT
SUT MNCRL AB 4-0 PS2 18 (SUTURE) IMPLANT
SUT PROLENE 3 0 SH 1 (SUTURE) ×3 IMPLANT
SUT PROLENE 3 0 SH DA (SUTURE) IMPLANT
SUT PROLENE 3 0 SH1 36 (SUTURE) ×3 IMPLANT
SUT PROLENE 4 0 RB 1 (SUTURE) ×15
SUT PROLENE 4 0 SH DA (SUTURE) IMPLANT
SUT PROLENE 4 0 TF (SUTURE) ×6 IMPLANT
SUT PROLENE 4-0 RB1 .5 CRCL 36 (SUTURE) ×4 IMPLANT
SUT PROLENE 5 0 C 1 36 (SUTURE) ×6 IMPLANT
SUT PROLENE 5 0 CC1 (SUTURE) IMPLANT
SUT PROLENE 6 0 C 1 30 (SUTURE) IMPLANT
SUT PROLENE 6 0 CC (SUTURE) ×6 IMPLANT
SUT PROLENE 7 0 BV 1 (SUTURE) IMPLANT
SUT PROLENE 7 0 BV1 MDA (SUTURE) ×3 IMPLANT
SUT PROLENE 7.0 RB 3 (SUTURE) IMPLANT
SUT PROLENE 8 0 BV175 6 (SUTURE) IMPLANT
SUT SILK  1 MH (SUTURE) ×2
SUT SILK 1 MH (SUTURE) ×4 IMPLANT
SUT SILK 1 TIES 10X30 (SUTURE) ×3 IMPLANT
SUT SILK 2 0 (SUTURE) ×3
SUT SILK 2 0 SH CR/8 (SUTURE) ×6 IMPLANT
SUT SILK 2-0 18XBRD TIE 12 (SUTURE) ×2 IMPLANT
SUT SILK 3 0 SH CR/8 (SUTURE) ×3 IMPLANT
SUT SILK 4 0 (SUTURE) ×3
SUT SILK 4-0 18XBRD TIE 12 (SUTURE) ×2 IMPLANT
SUT STEEL 6MS V (SUTURE) ×3 IMPLANT
SUT STEEL STERNAL CCS#1 18IN (SUTURE) IMPLANT
SUT STEEL SZ 6 DBL 3X14 BALL (SUTURE) ×3 IMPLANT
SUT TEM PAC WIRE 2 0 SH (SUTURE) ×12 IMPLANT
SUT VIC AB 1 CTX 18 (SUTURE) ×6 IMPLANT
SUT VIC AB 1 CTX 36 (SUTURE)
SUT VIC AB 1 CTX36XBRD ANBCTR (SUTURE) IMPLANT
SUT VIC AB 2-0 CT1 27 (SUTURE) ×3
SUT VIC AB 2-0 CT1 TAPERPNT 27 (SUTURE) IMPLANT
SUT VIC AB 2-0 CTX 27 (SUTURE) IMPLANT
SUT VIC AB 3-0 SH 27 (SUTURE)
SUT VIC AB 3-0 SH 27X BRD (SUTURE) IMPLANT
SUT VIC AB 3-0 X1 27 (SUTURE) IMPLANT
SUT VICRYL 4-0 PS2 18IN ABS (SUTURE) ×1 IMPLANT
SUTURE E-PAK OPEN HEART (SUTURE) ×3 IMPLANT
SYSTEM SAHARA CHEST DRAIN ATS (WOUND CARE) ×3 IMPLANT
TAPE CLOTH SURG 4X10 WHT LF (GAUZE/BANDAGES/DRESSINGS) ×1 IMPLANT
TOWEL OR 17X24 6PK STRL BLUE (TOWEL DISPOSABLE) ×6 IMPLANT
TOWEL OR 17X26 10 PK STRL BLUE (TOWEL DISPOSABLE) ×6 IMPLANT
TRAY FOLEY IC TEMP SENS 14FR (CATHETERS) ×3 IMPLANT
TUBE FEEDING 8FR 16IN STR KANG (MISCELLANEOUS) ×3 IMPLANT
TUBING INSUFFLATION 10FT LAP (TUBING) ×3 IMPLANT
UNDERPAD 30X30 INCONTINENT (UNDERPADS AND DIAPERS) ×3 IMPLANT
VENT LEFT HEART 12002 (CATHETERS) ×3
WATER STERILE IRR 1000ML POUR (IV SOLUTION) ×6 IMPLANT

## 2013-08-28 NOTE — H&P (Signed)
[address]       [name] [address]             [phone]                        Maria [name] [place] Medical Record #[id number] Date of Birth: 12-25-1923  Referring: Dr Pernell Dupre Primary Care: Mathews Argyle, MD  Chief Complaint:    SOB and Chest Pain   History of Present Illness:    Patient is a independent 79 year old female with progressive symptoms of angina and congestive heart failure she underwent cardiac catheterization several days ago and is now referred by Dr. [name] to consider treatment options.   The patient has had progressive exertional fatigue and dyspnea for 12 months. She developed paroxysmal atrial fibrillation approximately 15 months ago has had 3 distinct episodes. She was loaded with amiodarone and atrial fibrillation has come under good control. She was noted to develop hypothyroidism secondary 2 amiodarone. Despite control of atrial fib, exertional fatigue and dyspnea have worsened. She began having lower extremity edema.  A 2-D Doppler echocardiogram  demonstrated moderate to severe mitral regurgitation with significant left atrial enlargement and moderate to severe pulmonary hypertension ( 55 mmHg by echo). With the addition of Lasix lower. lower extremity edema has improved. She still has  orthopnea. She has no exertional tolerance according to her husband. She is also recently experienced some vague chest discomfort that is nonexertional. The discomfort has been  mild and graded to be less than 2/10 in intensity. She now notes that she has been able to walk 100 yards to the dining hall at her current living situation.    Current Activity/ Functional Status:  Patient is independent with mobility/ambulation, transfers, ADL's, IADL's.  Zubrod Score: At the time of surgery this patient's most appropriate activity status/level should be described as: []  Normal activity, no symptoms []  Symptoms, fully  ambulatory [x]  Symptoms, in bed less than or equal to 50% of the time []  Symptoms, in bed greater than 50% of the time but less than 100% []  Bedridden []  Moribund   Past Medical History  Diagnosis Date  . Osteoporosis   . Celiac disease   . Celiac disease   . Carotid artery occlusion     right carotid stenosis 40-60%, 4/08, neg for stenosis repeat study 11/11  . Depression   . Presbycusis   . Macular degeneration   . Urethral stricture   . Moderate aortic insufficiency   . Moderate mitral regurgitation by prior echocardiogram     echo 9/13   . Thyroid nodule     76m, no change 11/11  . RLS (restless legs syndrome)   . Vitamin D deficiency   . Heart murmur   . Pulmonary hypertension   . Hypertension     takes Metoprolol daily  . Atrial fibrillation   . Coronary artery disease   . CHF (congestive heart failure)     takes Lasix daily  . MVP (mitral valve prolapse)   . Moderate obstructive sleep apnea     uses CPAP;sleep study about 4-556monthago  . Hearing loss     both ears  . Numbness     left arm  . Joint swelling   . GERD (gastroesophageal reflux disease)     takes Omeprazole daily  . Gastric ulcer     6-56m62monthgo  . GI bleeding   .  Constipation   . Diarrhea   . Hemorrhoids   . Urinary retention   . Urinary anastomotic stricture   . Hypothyroidism     takes Synthroid daily as result of AMiodarone  . Hot flashes     takes ZOloft 3 times a week  . Insomnia     takes Melatonin nightly  . Restless leg   . Peripheral edema     left    Past Surgical History  Procedure Laterality Date  . Appendectomy    . Tubal ligation    . Tonsillectomy    . Trigger thumb    . Cardiac catheterization  08-06-13  . Tcs    . Bilateral cataract surgery    . Mandible surgery      Family History  Problem Relation Age of Onset  . Heart attack Mother   . CVA Mother   . CAD Father   . Colon cancer Father   . Heart attack Father   . Heart attack Brother   .  Peripheral vascular disease Brother   . AAA (abdominal aortic aneurysm) Brother     History   Social History  . Marital Status: Married    Spouse Name: Dr Guillermina [place]    Number of Children: N/A  . Years of Education: N/A         Social History Main Topics  . Smoking status: Never Smoker   . Smokeless tobacco: Not on file  . Alcohol Use: No  . Drug Use: No  .       History  Smoking status  . Never Smoker   Smokeless tobacco  . Not on file    History  Alcohol Use No     Allergies  Allergen Reactions  . Amoxicillin     Nausea, dizziness  . Codeine Nausea And Vomiting  . Fosamax [Alendronate Sodium]     Jaw pain  . Gluten Meal     Celiacs  . Hctz [Hydrochlorothiazide]     Hyponatremia, GI upset  . Tetanus Toxoids     Current Facility-Administered Medications  Medication Dose Route Frequency Provider Last Rate Last Dose  . aminocaproic acid (AMICAR) 10 g in sodium chloride 0.9 % 100 mL infusion   Intravenous To OR Grace Isaac, MD      . cefUROXime (ZINACEF) 1.5 g in dextrose 5 % 50 mL IVPB  1.5 g Intravenous To OR Grace Isaac, MD      . cefUROXime (ZINACEF) 750 mg in dextrose 5 % 50 mL IVPB  750 mg Intravenous To OR Grace Isaac, MD      . chlorhexidine (HIBICLENS) 4 % liquid 2 application  30 mL Topical UD Grace Isaac, MD      . dexmedetomidine (PRECEDEX) 400 MCG/100ML infusion  0.1-0.7 mcg/kg/hr Intravenous To OR Grace Isaac, MD      . DOPamine (INTROPIN) 800 mg in dextrose 5 % 250 mL infusion  2-20 mcg/kg/min Intravenous To OR Grace Isaac, MD      . EPINEPHrine (ADRENALIN) 4,000 mcg in dextrose 5 % 250 mL infusion  0.5-20 mcg/min Intravenous To OR Grace Isaac, MD      . heparin 2,500 Units, papaverine 30 mg in electrolyte-148 (PLASMALYTE-148) 500 mL irrigation   Irrigation To OR Grace Isaac, MD      . heparin 30,000 units/NS 1000 mL solution for CELLSAVER   Other To OR Grace Isaac, MD      . insulin  regular (NOVOLIN R,HUMULIN R) 1 Units/mL in sodium chloride 0.9 % 100 mL infusion   Intravenous To OR Grace Isaac, MD      . magnesium sulfate (IV Push/IM) injection 40 mEq  40 mEq Other To OR Grace Isaac, MD      . metoprolol tartrate (LOPRESSOR) tablet 12.5 mg  12.5 mg Oral Once Grace Isaac, MD      . nitroGLYCERIN 0.2 mg/mL in dextrose 5 % infusion  2-200 mcg/min Intravenous To OR Grace Isaac, MD      . phenylephrine (NEO-SYNEPHRINE) 20,000 mcg in dextrose 5 % 250 mL infusion  30-200 mcg/min Intravenous To OR Grace Isaac, MD      . potassium chloride injection 80 mEq  80 mEq Other To OR Grace Isaac, MD      . vancomycin (VANCOCIN) 1,000 mg in sodium chloride 0.9 % 250 mL IVPB  1,000 mg Intravenous To OR Grace Isaac, MD           Review of Systems:     Cardiac Review of Systems: Y or N  Chest Pain [  y  ]  Resting SOB [  n ] Exertional SOB  Blue.Reese  ]  Vertell Limber Blue.Reese  ]   Pedal Edema [ y, now better  ]    Palpitations Blue.Reese  ] Syncope  [n  ]   Presyncope [ y  ]  General Review of Systems: [Y] = yes [  ]=no Constitional: recent weight change [n  ]; anorexia [  ]; fatigue [  ]; nausea [  ]; night sweats [  ]; fever [  ]; or chills [  ];                                                                                                                                          Dental: poor dentition[ n ]; Last Dentist visit: every 6 months  Eye : blurred vision [  ]; diplopia [   ]; vision changes [  ];  Amaurosis fugax[  ]; Resp: cough [  ];  wheezing[  ];  hemoptysis[  ]; shortness of breath[  ]; paroxysmal nocturnal dyspnea[  ]; dyspnea on exertion[  ]; or orthopnea[  ];  GI:  gallstones[  ], vomiting[  ];  dysphagia[  ]; melena[ n ];  hematochezia [n  ]; heartburn[  ];   Hx of  Colonoscopy[ y ]; GU: kidney stones [  ]; hematuria[  ];   dysuria [  ];  nocturia[  ];  history of     obstruction [  ]; urinary frequency [ n ]             Skin: rash, swelling[  ];,  hair loss[  ];  peripheral edema[  ];  or itching[  ]; Musculosketetal: myalgias[  ];  joint swelling[  ];  joint erythema[  ];  joint pain[  ];  back pain[  ];  Heme/Lymph: bruising[  ];  bleeding[  ];  anemia[  ];  Neuro: TIA[  ];  headaches[  ];  stroke[  ];  vertigo[  ];  seizures[  ];   paresthesias[  ];  difficulty walking[n  ];  Psych:depression[  ]; anxiety[  ];  Endocrine: diabetes[n  ];  thyroid dysfunction[n  ];  Immunizations: Flu [ y ]; Pneumococcal[ y ];  Other:  Physical Exam: BP 211/49  Pulse 47  Temp(Src) 96.9 F (36.1 C) (Oral)  Resp 18  Wt 114 lb (51.71 kg)  SpO2 98%  General appearance: alert, cooperative and Appears younger than stated age Neurologic: intact Heart: regular rate and rhythm, S1, S2 normal, no murmur, click, rub or gallop, murmur of mitral regurgitation 2/6 at the cardiac apex nonradiating Lungs: clear to auscultation bilaterally and normal percussion bilaterally Abdomen: soft, non-tender; bowel sounds normal; no masses,  no organomegaly Extremities: extremities normal, atraumatic, no cyanosis or edema, Homans sign is negative, no sign of DVT, no edema, redness or tenderness in the calves or thighs and no ulcers, gangrene or trophic changes Patient has no carotid bruits, no cervical supraclavicular adenopathy palpable radial and DP and PT pulses   Diagnostic Studies & Laboratory data:     Recent Radiology Findings:  Dg Chest 2 View  08/27/2013   CLINICAL DATA:  Preoperative evaluation for cardiac surgery, pulmonary hypertension, shortness of breath, coronary artery disease, mitral valve disorder, hypertension, CHF  EXAM: CHEST  2 VIEW  COMPARISON:  05/09/2013  FINDINGS: Enlargement of cardiac silhouette.  Calcified tortuous aorta.  Pulmonary vascularity normal.  COPD changes with small pleural effusions blunting the costophrenic angles.  Remaining lungs clear.  No pleural effusion or pneumothorax.  Bones demineralized with thoracolumbar scoliosis.   IMPRESSION: Enlargement of cardiac silhouette.  COPD changes.  Small bibasilar pleural effusions.   Electronically Signed   By: Lavonia Dana M.D.   On: 08/27/2013 16:49     Recent Lab Findings: Lab Results  Component Value Date   WBC 7.4 08/27/2013   HGB 13.6 08/27/2013   HCT 39.1 08/27/2013   PLT 270 08/27/2013   GLUCOSE 94 08/27/2013   ALT 48* 08/27/2013   AST 50* 08/27/2013   NA 124* 08/27/2013   K 4.4 08/27/2013   CL 94* 08/27/2013   CREATININE 0.72 08/27/2013   BUN 13 08/27/2013   CO2 18* 08/27/2013   INR 1.10 08/27/2013   HGBA1C 5.8* 08/27/2013   CATH: Procedure Date: 08/16/2013  Referring Physician: [name], M.D.  Primary Cardiologist:: Helyn Numbers, M.D.  INDICATIONS: Left heart failure, angina, and echo documented moderate mitral regurgitation. The studies being done to define coronary anatomy and hemodynamic consequences of mitral regurgitation.  PROCEDURE: 1. Left heart cath; 2. Right heart cath; 3. Left ventriculography; 4. Coronary angiography  CONSENT:  The risks, benefits, and details of the procedure were explained in detail to the patient. Risks including death, stroke, heart attack, kidney injury, allergy, limb ischemia, bleeding and radiation injury were discussed. The patient verbalized understanding and wanted to proceed. Informed written consent was obtained.  PROCEDURE TECHNIQUE: After Xylocaine anesthesia a 5 French sheath was placed in the right femoral artery and 7 French sheath in the right femoral vein with single anterior needle wall sticks. Coronary angiography was done using a 5 [place] A2 multipurpose and JR 4 catheter. Left ventriculography was done using the 5 French angled pigtail  catheter using power injection.  A main pulmonary artery O2 saturation as well as central aortic recording was performed. Thermodilution cardiac outputs were performed.  MEDICATIONS: Versed 1 mg; fentanyl 50 mcg IV  EQUIPMENT: 5 [place] A2 MP, 5 [place] JR 4, 5 [place] A2 MP   CONTRAST: Total of 95 cc.  COMPLICATIONS: None  HEMODYNAMICS: Aortic pressure 132/50; LV pressure 138/10; LVEDP 19, RA 8 mm mercury (mean), RV 44/12, PA 44/18, PCWP(mean) 18 with a V waves to 30 mm mercury, Cardiac Output 4.37 L per minute (Fick) and 3.13 L per minute (thermal), AV gradient none  ANGIOGRAPHIC DATA: The left main coronary artery is 20% ostial.  The left anterior descending artery is heavily calcified. The proximal vessel contains segmental 70% stenosis that overlaps the first diagonal. The first diagonal, relatively small, contains 95% stenosis. The LAD distal to the second diagonal contains segmental 95% stenosis. The apical LAD is patent..  The left circumflex artery is 80-90% stenosis in the midsegment (tandem stenoses). The vessel trifurcates inferolateral.  The right coronary artery is dominant. Irregularities are noted mid to distal. Large PDA is patent. 2 small left ventricular branches are noted.Marland Kitchen  BYPASS GRAFT ANGIOGRAPHY: Not applicable  PCI RESULTS: Not applicable  LEFT VENTRICULOGRAM: Left ventricular angiogram was done in the 30 RAO projection and revealed a left ventricular ejection fraction of 65%. No regional wall motion abnormality noted. Mitral regurgitation is graded to be moderate (2-3+) in severity.  IMPRESSIONS: 1. Moderate to moderately severe mitral regurgitation  2. Severe two-vessel coronary artery disease including diffuse LAD disease and relatively focal circumflex disease  3. Normal left ventricular systolic function with elevated end-diastolic pressures consistent with diastolic heart failure but she exhibits clinically  4. Moderate pulmonary hypertension  RECOMMENDATION: TCTS consultation for consideration of revascularization and valve repair versus alternative treatments which would include conservative medical management and possibly PCI although anatomy is unfavorable in the LAD.   ECHO: Patient: Maria [name], Maria [name] MR #: [id number] Study Date:  08/21/2013 Gender: F Age: 56 Height: 156.2cm Weight: 52.3kg BSA: 1.67m2 Pt. Status: Room:  [facility] ATTENDING GMerril AbbeSSprague[place] HMonterey ParkGDiamond Nickelcc:  ------------------------------------------------------------ LV EF: 60% - 65%  ------------------------------------------------------------ Indications: MVD [non-rheumatic] 424.0.  ------------------------------------------------------------ History: PMH: Mitral regurgitation. History of paroxysmal atrial fibrillation. Depressive disorder. Moderate to severe pulmonary hypertension. Coronary artery disease. Congestive heart failure. Risk factors: Hypertension.  ------------------------------------------------------------ Study Conclusions  - Left ventricle: The cavity size was normal. Systolic function was normal. The estimated ejection fraction was in the range of 60% to 65%. Wall motion was normal; there were no regional wall motion abnormalities. - Aortic valve: Moderate regurgitation. - Mitral valve: Mild anterior mitral valve prolapse. Moderate regurgitation. - Right atrium: The atrium was mildly dilated. - Tricuspid valve: Moderate regurgitation. - Pulmonary arteries: Systolic pressure was mildly to moderately increased. PA peak pressure: 518mHg (S). Transthoracic echocardiography. M-mode, complete 2D, spectral Doppler, and color Doppler. Height: Height: 156.2cm. Height: 61.5in. Weight: Weight: 52.3kg. Weight: 115.2lb. Body mass index: BMI: 21.5kg/m^2. Body surface area: BSA: 1.5157m Blood pressure: 141/80. Patient status: Outpatient. Location: Echo laboratory.  ------------------------------------------------------------  ------------------------------------------------------------ Left ventricle: The cavity size was normal. Systolic function was normal. The estimated ejection fraction was in the range of 60% to 65%.  Wall motion was normal; there were no regional wall motion abnormalities.  ------------------------------------------------------------ Aortic valve: Mildly thickened, mildly calcified leaflets. Cusp separation was normal. Doppler: Transvalvular velocity was within the normal range. There was no  stenosis. Moderate regurgitation.  ------------------------------------------------------------ Aorta: The aorta was normal, not dilated, and non-diseased.  ------------------------------------------------------------ Mitral valve: Mildly thickened leaflets . Leaflet separation was normal. Mild anterior mitral valve prolapse. Doppler: Transvalvular velocity was within the normal range. There was no evidence for stenosis. Moderate regurgitation. Peak gradient: 58m Hg (D).  ------------------------------------------------------------ Left atrium: The atrium was normal in size.  ------------------------------------------------------------ Right ventricle: The cavity size was normal. Wall thickness was normal. Systolic function was normal.  ------------------------------------------------------------ Pulmonic valve: Structurally normal valve. Cusp separation was normal. Doppler: Transvalvular velocity was within the normal range. Mild regurgitation.  ------------------------------------------------------------ Tricuspid valve: Structurally normal valve. Leaflet separation was normal. Doppler: Transvalvular velocity was within the normal range. Moderate regurgitation.  ------------------------------------------------------------ Pulmonary artery: The main pulmonary artery was normal-sized. Systolic pressure was mildly to moderately increased.  ------------------------------------------------------------ Right atrium: The atrium was mildly dilated.  ------------------------------------------------------------ Pericardium: The pericardium was normal in appearance. There was no  pericardial effusion.  ------------------------------------------------------------ Systemic veins: Inferior vena cava: The vessel was dilated; the respirophasic diameter changes were blunted (< 50%); findings are consistent with elevated central venous pressure.  ------------------------------------------------------------ Post procedure conclusions Ascending Aorta:  - The aorta was normal, not dilated, and non-diseased.     Assessment / Plan:      Patient is active 79year old female without significant comorbidities who presents with 12-15 months of progressive evidence of right heart failure dyspnea on exertion and now probable anginal symptoms. Cardiac catheterization shows significant 2 vessel coronary artery disease involving the LAD and circumflex. Previous echocardiogram and current echo suggest at least  moderate  mitral regurgitation. The degree of regurgitation at the time of cardiac catheterization is difficult to judge.  I spent more than 60 minutes reviewing with the patient her husband and daughter the diagnosis,  Symptoms and treatment options including coronary artery bypass grafting and possible mitral valve repair and replacement. In spite of her age of 89years she has no other significant comorbidities, her major limiting symptoms appear to be related to coronary artery disease andmitral insufficiency. The patient is willing to proceed with invasive route of treatment understanding that the risks of surgery and her age are increased. We will obtain an  updated  transthoracic echocardiogram for my review. Software problems prevent the review of the previous echocardiogram.   The goals risks and alternatives of the planned surgical procedure CABG poss mitral repair replacement  have been discussed with the patient in detail. The risks of the procedure including death, infection, stroke, myocardial infarction, bleeding, blood transfusion have all been discussed  specifically.  I have quoted RBascom Levelsa 6 % of perioperative mortality and a complication rate as high as 25 %. The patient's questions have been answered.RREGHAN THULis willing  to proceed with the planned procedure.   EGrace IsaacMD      [address]Taylor[address] [place] [address]Office [phone]   Beeper [phone] 08/28/2013 7:22 AM

## 2013-08-28 NOTE — Progress Notes (Signed)
  Echocardiogram Echocardiogram Transesophageal has been performed.  Philipp Deputy 08/28/2013, 12:03 PM

## 2013-08-28 NOTE — Progress Notes (Signed)
Sacral pad placed on buttock before surgery

## 2013-08-28 NOTE — Progress Notes (Signed)
Utilization Review Completed.Donne Anon T11/01/2013

## 2013-08-28 NOTE — Transfer of Care (Signed)
Immediate Anesthesia Transfer of Care Note  Patient: CORSICA FRANSON  Procedure(s) Performed: Procedure(s): CORONARY ARTERY BYPASS GRAFTING (CABG) x2 using right greater saphenous vein and left internal mammary artery.  (N/A) INTRAOPERATIVE TRANSESOPHAGEAL ECHOCARDIOGRAM (N/A)  Patient Location: PACU and SICU  Anesthesia Type:General  Level of Consciousness: Patient remains intubated per anesthesia plan  Airway & Oxygen Therapy: Patient remains intubated per anesthesia plan  Post-op Assessment: Report given to PACU RN  Post vital signs: Reviewed and stable  Complications: No apparent anesthesia complications

## 2013-08-28 NOTE — Brief Op Note (Addendum)
      [address]       [place] [id number]             [phone]      08/28/2013  10:35 AM  PATIENT:  Maria [name]  79 y.o. female  PRE-OPERATIVE DIAGNOSIS:  1. CAD 2. Moderate  MR, moderate AI  POST-OPERATIVE DIAGNOSIS:  1. CAD 2. Trace MR  PROCEDURE: INTRAOPERATIVE TRANSESOPHAGEAL ECHOCARDIOGRAM, CORONARY ARTERY BYPASS GRAFTING (CABG) x2 using right greater saphenous vein and left internal mammary artery, and LA CLIP     SURGEON:  Surgeon(s) and Role:    * Grace Isaac, MD - Primary  PHYSICIAN ASSISTANT: Lars Pinks PA-C   ANESTHESIA:   general   DRAINS: Chest Tube(s) in the Mediastinal and pleural spaces   COUNTS CORRECT:  YES  DICTATION: .Dragon Dictation  PLAN OF CARE: Admit to inpatient   PATIENT DISPOSITION:  ICU - intubated and hemodynamically stable.   Delay start of Pharmacological VTE agent (>24hrs) due to surgical blood loss or risk of bleeding: yes  PRE OP WEIGHT: 52 kg

## 2013-08-28 NOTE — Preoperative (Signed)
Beta Blockers   Reason not to administer Beta Blockers:Not Applicable 

## 2013-08-28 NOTE — Anesthesia Preprocedure Evaluation (Addendum)
Anesthesia Evaluation  Patient identified by MRN, date of birth, ID band Patient awake    Reviewed: Allergy & Precautions, H&P , NPO status , Patient's Chart, lab work & pertinent test results, reviewed documented beta blocker date and time   History of Anesthesia Complications Negative for: history of anesthetic complications  Airway Mallampati: II TM Distance: >3 FB Neck ROM: Full    Dental  (+) Teeth Intact   Pulmonary shortness of breath and with exertion, sleep apnea ,  breath sounds clear to auscultation  Pulmonary exam normal       Cardiovascular hypertension, Pt. on medications + CAD, + Peripheral Vascular Disease and +CHF + dysrhythmias Atrial Fibrillation + Valvular Problems/Murmurs MR Rhythm:Regular Rate:Bradycardia  EF60%   Neuro/Psych    GI/Hepatic Neg liver ROS, PUD, GERD-  ,  Endo/Other  Hypothyroidism   Renal/GU negative Renal ROS     Musculoskeletal   Abdominal Normal abdominal exam  (+)   Peds  Hematology   Anesthesia Other Findings   Reproductive/Obstetrics                         Anesthesia Physical Anesthesia Plan  ASA: IV  Anesthesia Plan: General   Post-op Pain Management:    Induction: Intravenous  Airway Management Planned: Oral ETT  Additional Equipment: PA Cath, Ultrasound Guidance Line Placement, 3D TEE and Arterial line  Intra-op Plan:   Post-operative Plan: Post-operative intubation/ventilation  Informed Consent: I have reviewed the patients History and Physical, chart, labs and discussed the procedure including the risks, benefits and alternatives for the proposed anesthesia with the patient or authorized representative who has indicated his/her understanding and acceptance.     Plan Discussed with: CRNA, Anesthesiologist and Surgeon  Anesthesia Plan Comments:         Anesthesia Quick Evaluation

## 2013-08-28 NOTE — Progress Notes (Signed)
Day of Surgery Procedure(s) (LRB): CORONARY ARTERY BYPASS GRAFTING (CABG) x2 using right greater saphenous vein and left internal mammary artery.  (N/A) INTRAOPERATIVE TRANSESOPHAGEAL ECHOCARDIOGRAM (N/A) Subjective: Hemodynamics stable post CABG Slow to wakeup from gen anesthesia Postop lab reviewed  Objective: Vital signs in last 24 hours: Temp:  [94.6 F (34.8 C)-98.1 F (36.7 C)] 96.6 F (35.9 C) (11/04 2100) Pulse Rate:  [47-91] 91 (11/04 2100) Cardiac Rhythm:  [-] A-V Sequential paced (11/04 2100) Resp:  [10-18] 12 (11/04 2100) BP: (73-211)/(44-65) 95/55 mmHg (11/04 2100) SpO2:  [98 %-100 %] 100 % (11/04 2100) Arterial Line BP: (92-162)/(43-75) 125/53 mmHg (11/04 2100) FiO2 (%):  [50 %] 50 % (11/04 2100)  Hemodynamic parameters for last 24 hours: PAP: (20-31)/(13-20) 31/20 mmHg CO:  [2.3 L/min-2.6 L/min] 2.6 L/min CI:  [1.5 L/min/m2-1.7 L/min/m2] 1.7 L/min/m2  Intake/Output from previous day:   Intake/Output this shift: Total I/O In: 400 [I.V.:100; IV Piggyback:300] Out: 270 [Urine:210; Chest Tube:60]    Lab Results:  Recent Labs  08/28/13 1233 08/28/13 1841 08/28/13 1857  WBC 8.3 10.4  --   HGB 10.6* 10.3* 9.5*  HCT 29.5* 30.0* 28.0*  PLT 140* 154  --    BMET:  Recent Labs  08/27/13 1433  08/28/13 1231 08/28/13 1841 08/28/13 1857  NA 124*  < > 134*  --  135  K 4.4  < > 3.5  --  3.8  CL 94*  --   --   --  115*  CO2 18*  --   --   --   --   GLUCOSE 94  < > 98  --  149*  BUN 13  --   --   --  5*  CREATININE 0.72  --   --  0.72 0.80  CALCIUM 9.0  --   --   --   --   < > = values in this interval not displayed.  PT/INR:  Recent Labs  08/28/13 1233  LABPROT 19.0*  INR 1.64*   ABG    Component Value Date/Time   PHART 7.371 08/28/2013 1337   HCO3 21.8 08/28/2013 1337   TCO2 22 08/28/2013 1857   ACIDBASEDEF 3.0* 08/28/2013 1337   O2SAT 99.0 08/28/2013 1337   CBG (last 3)   Recent Labs  08/28/13 1546 08/28/13 1654 08/28/13 1820  GLUCAP 94  97 125*    Assessment/Plan: S/P Procedure(s) (LRB): CORONARY ARTERY BYPASS GRAFTING (CABG) x2 using right greater saphenous vein and left internal mammary artery.  (N/A) INTRAOPERATIVE TRANSESOPHAGEAL ECHOCARDIOGRAM (N/A) Cont current care   LOS: 0 days    VAN TRIGT III,Marleny Faller 08/28/2013

## 2013-08-29 ENCOUNTER — Inpatient Hospital Stay (HOSPITAL_COMMUNITY): Payer: Medicare Other

## 2013-08-29 DIAGNOSIS — J95821 Acute postprocedural respiratory failure: Secondary | ICD-10-CM

## 2013-08-29 DIAGNOSIS — I5032 Chronic diastolic (congestive) heart failure: Secondary | ICD-10-CM

## 2013-08-29 LAB — POCT I-STAT, CHEM 8
BUN: 6 mg/dL (ref 6–23)
Calcium, Ion: 1.35 mmol/L — ABNORMAL HIGH (ref 1.13–1.30)
Chloride: 105 mEq/L (ref 96–112)
Creatinine, Ser: 0.9 mg/dL (ref 0.50–1.10)
Glucose, Bld: 145 mg/dL — ABNORMAL HIGH (ref 70–99)
HCT: 35 % — ABNORMAL LOW (ref 36.0–46.0)
Hemoglobin: 11.9 g/dL — ABNORMAL LOW (ref 12.0–15.0)
Potassium: 4.4 mEq/L (ref 3.5–5.1)
Sodium: 142 mEq/L (ref 135–145)
TCO2: 24 mmol/L (ref 0–100)

## 2013-08-29 LAB — CBC
Hemoglobin: 11.1 g/dL — ABNORMAL LOW (ref 12.0–15.0)
Hemoglobin: 11.4 g/dL — ABNORMAL LOW (ref 12.0–15.0)
MCH: 32.2 pg (ref 26.0–34.0)
MCHC: 34 g/dL (ref 30.0–36.0)
MCV: 96.3 fL (ref 78.0–100.0)
RBC: 3.48 MIL/uL — ABNORMAL LOW (ref 3.87–5.11)
RBC: 3.54 MIL/uL — ABNORMAL LOW (ref 3.87–5.11)
RDW: 14.5 % (ref 11.5–15.5)
WBC: 23.4 10*3/uL — ABNORMAL HIGH (ref 4.0–10.5)

## 2013-08-29 LAB — BASIC METABOLIC PANEL
Creatinine, Ser: 0.63 mg/dL (ref 0.50–1.10)
GFR calc non Af Amer: 78 mL/min — ABNORMAL LOW (ref >90)
Glucose, Bld: 121 mg/dL — ABNORMAL HIGH (ref 70–99)
Potassium: 4.2 mEq/L (ref 3.5–5.1)
Sodium: 139 mEq/L (ref 135–145)

## 2013-08-29 LAB — TSH: TSH: 3.025 u[IU]/mL (ref 0.350–4.500)

## 2013-08-29 LAB — BLOOD GAS, ARTERIAL
Acid-base deficit: 2.3 mmol/L — ABNORMAL HIGH (ref 0.0–2.0)
Bicarbonate: 21.8 mEq/L (ref 20.0–24.0)
Drawn by: 19170
FIO2: 0.6 %
MECHVT: 450 mL
O2 Saturation: 95.8 %
PEEP: 5 cmH2O
Patient temperature: 98.6
RATE: 18 resp/min
TCO2: 22.9 mmol/L (ref 0–100)
pCO2 arterial: 36.3 mmHg (ref 35.0–45.0)
pH, Arterial: 7.395 (ref 7.350–7.450)
pO2, Arterial: 71.8 mmHg — ABNORMAL LOW (ref 80.0–100.0)

## 2013-08-29 LAB — POCT I-STAT 3, ART BLOOD GAS (G3+)
Acid-base deficit: 5 mmol/L — ABNORMAL HIGH (ref 0.0–2.0)
Acid-base deficit: 5 mmol/L — ABNORMAL HIGH (ref 0.0–2.0)
Acid-base deficit: 5 mmol/L — ABNORMAL HIGH (ref 0.0–2.0)
Acid-base deficit: 5 mmol/L — ABNORMAL HIGH (ref 0.0–2.0)
Bicarbonate: 21.5 mEq/L (ref 20.0–24.0)
Bicarbonate: 23 mEq/L (ref 20.0–24.0)
Bicarbonate: 24.3 mEq/L — ABNORMAL HIGH (ref 20.0–24.0)
Bicarbonate: 21.5 mEq/L (ref 20.0–24.0)
O2 Saturation: 95 %
O2 Saturation: 87 %
O2 Saturation: 98 %
O2 Saturation: 95 %
Patient temperature: 37.2
Patient temperature: 37
Patient temperature: 37.2
TCO2: 23 mmol/L (ref 0–100)
TCO2: 25 mmol/L (ref 0–100)
TCO2: 26 mmol/L (ref 0–100)
TCO2: 23 mmol/L (ref 0–100)
pCO2 arterial: 44.4 mmHg (ref 35.0–45.0)
pCO2 arterial: 53.6 mmHg — ABNORMAL HIGH (ref 35.0–45.0)
pCO2 arterial: 64.8 mmHg (ref 35.0–45.0)
pCO2 arterial: 44 mmHg (ref 35.0–45.0)
pH, Arterial: 7.293 — ABNORMAL LOW (ref 7.350–7.450)
pH, Arterial: 7.24 — ABNORMAL LOW (ref 7.350–7.450)
pH, Arterial: 7.182 — CL (ref 7.350–7.450)
pH, Arterial: 7.298 — ABNORMAL LOW (ref 7.350–7.450)
pO2, Arterial: 83 mmHg (ref 80.0–100.0)
pO2, Arterial: 64 mmHg — ABNORMAL LOW (ref 80.0–100.0)
pO2, Arterial: 126 mmHg — ABNORMAL HIGH (ref 80.0–100.0)
pO2, Arterial: 84 mmHg (ref 80.0–100.0)

## 2013-08-29 LAB — GLUCOSE, CAPILLARY
Glucose-Capillary: 111 mg/dL — ABNORMAL HIGH (ref 70–99)
Glucose-Capillary: 111 mg/dL — ABNORMAL HIGH (ref 70–99)
Glucose-Capillary: 118 mg/dL — ABNORMAL HIGH (ref 70–99)
Glucose-Capillary: 130 mg/dL — ABNORMAL HIGH (ref 70–99)
Glucose-Capillary: 109 mg/dL — ABNORMAL HIGH (ref 70–99)
Glucose-Capillary: 123 mg/dL — ABNORMAL HIGH (ref 70–99)
Glucose-Capillary: 136 mg/dL — ABNORMAL HIGH (ref 70–99)
Glucose-Capillary: 123 mg/dL — ABNORMAL HIGH (ref 70–99)

## 2013-08-29 LAB — CREATININE, SERUM: GFR calc Af Amer: 90 mL/min — ABNORMAL LOW (ref >90)

## 2013-08-29 MED ORDER — CHLORHEXIDINE GLUCONATE 0.12 % MT SOLN
OROMUCOSAL | Status: AC
  Administered 2013-08-29: 15 mL via OROMUCOSAL
  Filled 2013-08-29: qty 15

## 2013-08-29 MED ORDER — FENTANYL CITRATE 0.05 MG/ML IJ SOLN
INTRAMUSCULAR | Status: AC
  Filled 2013-08-29: qty 2

## 2013-08-29 MED ORDER — ETOMIDATE 2 MG/ML IV SOLN
20.0000 mg | Freq: Once | INTRAVENOUS | Status: AC
  Administered 2013-08-29: 20 mg via INTRAVENOUS

## 2013-08-29 MED ORDER — CHLORHEXIDINE GLUCONATE 0.12 % MT SOLN
OROMUCOSAL | Status: AC
  Filled 2013-08-29: qty 15

## 2013-08-29 MED ORDER — FENTANYL CITRATE 0.05 MG/ML IJ SOLN
25.0000 ug | INTRAMUSCULAR | Status: DC | PRN
  Administered 2013-08-29: 100 ug via INTRAVENOUS

## 2013-08-29 MED ORDER — DOPAMINE-DEXTROSE 3.2-5 MG/ML-% IV SOLN
3.0000 ug/kg/min | INTRAVENOUS | Status: DC
  Administered 2013-08-29: 3 ug/kg/min via INTRAVENOUS

## 2013-08-29 MED ORDER — FUROSEMIDE 10 MG/ML IJ SOLN
40.0000 mg | Freq: Once | INTRAMUSCULAR | Status: AC
  Administered 2013-08-29: 40 mg via INTRAVENOUS

## 2013-08-29 MED ORDER — CHLORHEXIDINE GLUCONATE 0.12 % MT SOLN
15.0000 mL | Freq: Two times a day (BID) | OROMUCOSAL | Status: DC
  Administered 2013-08-29 – 2013-09-01 (×6): 15 mL via OROMUCOSAL
  Filled 2013-08-29 (×5): qty 15

## 2013-08-29 MED ORDER — ENOXAPARIN SODIUM 30 MG/0.3ML ~~LOC~~ SOLN
30.0000 mg | SUBCUTANEOUS | Status: DC
  Administered 2013-08-29: 30 mg via SUBCUTANEOUS
  Filled 2013-08-29 (×2): qty 0.3

## 2013-08-29 MED ORDER — BIOTENE DRY MOUTH MT LIQD
15.0000 mL | Freq: Four times a day (QID) | OROMUCOSAL | Status: DC
  Administered 2013-08-30 – 2013-08-31 (×7): 15 mL via OROMUCOSAL

## 2013-08-29 MED ORDER — SODIUM BICARBONATE 8.4 % IV SOLN
25.0000 meq | Freq: Once | INTRAVENOUS | Status: AC
  Administered 2013-08-29: 25 meq via INTRAVENOUS
  Filled 2013-08-29: qty 25

## 2013-08-29 MED ORDER — ENOXAPARIN SODIUM 30 MG/0.3ML ~~LOC~~ SOLN: 30.0000 mg | SUBCUTANEOUS | Status: DC

## 2013-08-29 MED ORDER — CHLORHEXIDINE GLUCONATE 0.12 % MT SOLN
OROMUCOSAL | Status: AC
  Administered 2013-08-29: 15 mL
  Filled 2013-08-29: qty 15

## 2013-08-29 MED FILL — Sodium Bicarbonate IV Soln 8.4%: INTRAVENOUS | Qty: 50 | Status: AC

## 2013-08-29 MED FILL — Potassium Chloride Inj 2 mEq/ML: INTRAVENOUS | Qty: 40 | Status: AC

## 2013-08-29 MED FILL — Mannitol IV Soln 20%: INTRAVENOUS | Qty: 500 | Status: AC

## 2013-08-29 MED FILL — Heparin Sodium (Porcine) Inj 1000 Unit/ML: INTRAMUSCULAR | Qty: 30 | Status: AC

## 2013-08-29 MED FILL — Lidocaine HCl IV Inj 20 MG/ML: INTRAVENOUS | Qty: 5 | Status: AC

## 2013-08-29 MED FILL — Sodium Chloride IV Soln 0.9%: INTRAVENOUS | Qty: 1000 | Status: AC

## 2013-08-29 MED FILL — Magnesium Sulfate Inj 50%: INTRAMUSCULAR | Qty: 10 | Status: AC

## 2013-08-29 MED FILL — Electrolyte-R (PH 7.4) Solution: INTRAVENOUS | Qty: 4000 | Status: AC

## 2013-08-29 MED FILL — Sodium Chloride Irrigation Soln 0.9%: Qty: 3000 | Status: AC

## 2013-08-29 MED FILL — Dexmedetomidine HCl IV Soln 200 MCG/2ML: INTRAVENOUS | Qty: 2 | Status: AC

## 2013-08-29 MED FILL — Heparin Sodium (Porcine) Inj 1000 Unit/ML: INTRAMUSCULAR | Qty: 10 | Status: AC

## 2013-08-29 NOTE — Procedures (Signed)
Extubation Procedure Note  Patient Details:   Name: Maria [name] DOB: 05-07-1924 MRN: [id number]   Airway Documentation:     Evaluation  O2 sats: stable throughout Complications: No apparent complications Patient did tolerate procedure well. Bilateral Breath Sounds: Clear Suctioning: Airway Yes  Extubated patient per protocol.  Patient had positive cuff leak prior to extubation.  NIF -22, VC 600 ml.  No complications noted.  No stridor present.  Patient able to vocalize post extubation.  Placed on 4l [place].  Phillis Knack [facility] 08/29/2013, 10:52 AM

## 2013-08-29 NOTE — Progress Notes (Addendum)
[facility] DAILY ICU PROGRESS NOTE                   [address]            [place] [address]          [phone]   1 Day Post-Op Procedure(s) (LRB): CORONARY ARTERY BYPASS GRAFTING (CABG) x2 using right greater saphenous vein and left internal mammary artery.  (N/A) INTRAOPERATIVE TRANSESOPHAGEAL ECHOCARDIOGRAM (N/A)  Total Length of Stay:  LOS: 1 day   Subjective: Awake on vent, following commands.     Objective: Vital signs in last 24 hours: Temp:  [94.6 F (34.8 C)-99.3 F (37.4 C)] 99.1 F (37.3 C) (11/05 0645) Pulse Rate:  [79-91] 91 (11/05 0645) Cardiac Rhythm:  [-] A-V Sequential paced (11/05 0600) Resp:  [10-14] 12 (11/05 0645) BP: (73-132)/(44-65) 106/55 mmHg (11/05 0645) SpO2:  [96 %-100 %] 97 % (11/05 0645) Arterial Line BP: (92-162)/(43-75) 128/53 mmHg (11/05 0645) FiO2 (%):  [50 %] 50 % (11/05 0355) Weight:  [114 lb (51.71 kg)-122 lb 12.7 oz (55.7 kg)] 122 lb 12.7 oz (55.7 kg) (11/05 0500)  Filed Weights   08/27/13 1430 08/28/13 1245 08/29/13 0500  Weight: 114 lb (51.71 kg) 114 lb (51.71 kg) 122 lb 12.7 oz (55.7 kg)  PRE OP WEIGHT: 52 kg   Weight change: 0 lb (0 kg)   Hemodynamic parameters for last 24 hours: PAP: (20-42)/(12-27) 27/15 mmHg CO:  [2.3 L/min-3.4 L/min] 3.3 L/min CI:  [1.5 L/min/m2-2.2 L/min/m2] 2.2 L/min/m2  Intake/Output from previous day: 11/04 0701 - 11/05 0700 In: 5530.6 [I.V.:3075.6; Blood:355; IV Piggyback:2100] Out: 2130 [Urine:5160; Blood:1370; Chest Tube:623]  CBGs 94-97-125-149-111-111-118-121     Current Meds: Scheduled Meds: . acetaminophen  1,000 mg Oral Q6H   Or  . acetaminophen (TYLENOL) oral liquid 160 mg/5 mL  1,000 mg Per Tube Q6H  . amiodarone  200 mg Oral Daily  . aspirin EC  325 mg Oral Daily   Or  . aspirin  324 mg Per Tube Daily  . bisacodyl  10 mg Oral Daily   Or  . bisacodyl  10 mg Rectal Daily  . cefUROXime (ZINACEF)  IV  1.5 g Intravenous Q12H  . docusate sodium  200 mg Oral Daily    . insulin aspart  0-24 Units Subcutaneous Q4H  . levothyroxine  50 mcg Oral QAC breakfast  . metoCLOPramide (REGLAN) injection  10 mg Intravenous Q6H  . metoprolol tartrate  12.5 mg Oral BID   Or  . metoprolol tartrate  12.5 mg Per Tube BID  . [START ON 08/30/2013] pantoprazole  40 mg Oral Daily  . sertraline  50 mg Oral 3 times weekly  . sodium chloride  3 mL Intravenous Q12H   Continuous Infusions: . sodium chloride 20 mL/hr at 08/29/13 0600  . sodium chloride 10 mL/hr at 08/29/13 0600  . sodium chloride    . dexmedetomidine Stopped (08/28/13 1716)  . DOPamine 3 mcg/kg/min (08/29/13 0600)  . lactated ringers 20 mL/hr at 08/29/13 0600  . milrinone 0.3 mcg/kg/min (08/29/13 0600)  . nitroGLYCERIN Stopped (08/29/13 0555)  . phenylephrine (NEO-SYNEPHRINE) Adult infusion 5 mcg/min (08/29/13 0630)   PRN Meds:.metoprolol, midazolam, morphine injection, ondansetron (ZOFRAN) IV, potassium chloride, sodium chloride, traMADol   Physical Exam: General appearance: On vent. Opens eyes, follows commands. Heart: regular rate and rhythm, paced at 91 Lungs: clear to auscultation bilaterally though BS diminished Extremities: no significant LE edema Wound: Dressed and dry    Lab Results:  CBC: Recent Labs  08/28/13 1841 08/28/13 1857 08/29/13 0434  WBC 10.4  --  13.2*  HGB 10.3* 9.5* 11.1*  HCT 30.0* 28.0* 32.6*  PLT 154  --  178   BMET:  Recent Labs  08/27/13 1433  08/28/13 1857 08/29/13 0434  NA 124*  < > 135 139  K 4.4  < > 3.8 4.2  CL 94*  --  115* 109  CO2 18*  --   --  21  GLUCOSE 94  < > 149* 121*  BUN 13  --  5* 6  CREATININE 0.72  < > 0.80 0.63  CALCIUM 9.0  --   --  8.3*  < > = values in this interval not displayed.  PT/INR:  Recent Labs  08/28/13 1233  LABPROT 19.0*  INR 1.64*   Radiology: Dg Chest 2 View  08/27/2013   CLINICAL DATA:  Preoperative evaluation for cardiac surgery, pulmonary hypertension, shortness of breath, coronary artery disease, mitral  valve disorder, hypertension, CHF  EXAM: CHEST  2 VIEW  COMPARISON:  05/09/2013  FINDINGS: Enlargement of cardiac silhouette.  Calcified tortuous aorta.  Pulmonary vascularity normal.  COPD changes with small pleural effusions blunting the costophrenic angles.  Remaining lungs clear.  No pleural effusion or pneumothorax.  Bones demineralized with thoracolumbar scoliosis.  IMPRESSION: Enlargement of cardiac silhouette.  COPD changes.  Small bibasilar pleural effusions.   Electronically Signed   By: Lavonia Dana M.D.   On: 08/27/2013 16:49   Dg Chest Portable 1 View  08/28/2013   CLINICAL DATA:  Post CABG  EXAM: PORTABLE CHEST - 1 VIEW  COMPARISON:  08/27/2013  FINDINGS: Ppm status post CABG. NG tube crosses into the stomach with side hole above the level of the diaphragm. Left chest tube with no pneumothorax. Swan-Ganz central line with tip in the main pulmonary artery. Endotracheal tube with tip about 8 mm above the carinal. Mild mid to lower lung zone atelectasis on the left. No significant consolidation, effusion, or edema.  IMPRESSION: Acceptable postoperative appearance. Tip of endotracheal tube the only about 8 mm above the carinal. See above description of NG tube as well.   Electronically Signed   By: Skipper Cliche M.D.   On: 08/28/2013 12:52     Assessment/Plan: S/P Procedure(s) (LRB): CORONARY ARTERY BYPASS GRAFTING (CABG) x2 using right greater saphenous vein and left internal mammary artery.  (N/A) INTRAOPERATIVE TRANSESOPHAGEAL ECHOCARDIOGRAM (N/A) She has been slow to wean from the vent due to sedation.  Currently off all sedatives/narcotics.  Opens eyes and follow commands.  Hopefully can extubate later this am. CV- SBPs low, still on low dose Neo, Dopamine.  Wean drips as able.  Bradycardic under pacer, rates in 50s.  Continue pacing for now. Routine POD #1 ICU management.   [name],[name] 08/29/2013 8:04 AM  Awake and neuro intact, weaning vent Check TSH Expected Acute  Blood -  loss Anemia, no transfusion  I have seen and examined Maria [name] and agree with the above assessment  and plan.  Grace Isaac MD Beeper [phone] Office [phone] 08/29/2013 9:04 AM

## 2013-08-29 NOTE — Procedures (Signed)
Intubation Procedure Note Maria [name] [id number] [birth year]  Procedure: Intubation Indications: Respiratory insufficiency  Procedure Details Consent: Risks of procedure as well as the alternatives and risks of each were explained to the (patient/caregiver).  Consent for procedure obtained. Time Out: Verified patient identification, verified procedure, site/side was marked, verified correct patient position, special equipment/implants available, medications/allergies/relevent history reviewed, required imaging and test results available.  Performed  Maximum sterile technique was used including gloves, hand hygiene and mask.  4    Evaluation Hemodynamic Status: BP stable throughout; O2 sats: stable throughout Patient's Current Condition: stable Complications: No apparent complications Patient did tolerate procedure well. Chest X-ray ordered to verify placement.  CXR: pending.   ALVA,RAKESH V. 08/29/2013

## 2013-08-29 NOTE — Progress Notes (Signed)
Patient ID: Maria [name], female   DOB: [birth year], 79 y.o.   MRN: [id number] EVENING ROUNDS NOTE :     [address]       [place] [address]             [phone]                 1 Day Post-Op Procedure(s) (LRB): CORONARY ARTERY BYPASS GRAFTING (CABG) x2 using right greater saphenous vein and left internal mammary artery.  (N/A) INTRAOPERATIVE TRANSESOPHAGEAL ECHOCARDIOGRAM (N/A)  Total Length of Stay:  LOS: 1 day  BP 123/52  Pulse 91  Temp(Src) 98.6 F (37 C) (Core (Comment))  Resp 13  Ht 5' 2.5" (1.588 m)  Wt 122 lb 12.7 oz (55.7 kg)  BMI 22.09 kg/m2  SpO2 89%  .Intake/Output     11/04 0701 - 11/05 0700 11/05 0701 - 11/06 0700   I.V. (mL/kg) 3133.2 (56.3) 409.5 (7.4)   Blood 355    IV Piggyback 2100    Total Intake(mL/kg) 5588.2 (100.3) 409.5 (7.4)   Urine (mL/kg/hr) 5160 (3.9) 585 (1.1)   Blood 1370 (1)    Chest Tube 623 (0.5) 270 (0.5)   Total Output 7153 855   Net -1564.8 -445.5          . sodium chloride 20 mL/hr at 08/29/13 1400  . sodium chloride 10 mL/hr at 08/29/13 1400  . sodium chloride    . dexmedetomidine Stopped (08/28/13 1716)  . DOPamine Stopped (08/29/13 1200)  . lactated ringers 20 mL/hr at 08/29/13 1400  . milrinone 0.3 mcg/kg/min (08/29/13 1400)  . nitroGLYCERIN Stopped (08/29/13 0555)  . phenylephrine (NEO-SYNEPHRINE) Adult infusion Stopped (08/29/13 1030)     Lab Results  Component Value Date   WBC 13.2* 08/29/2013   HGB 11.1* 08/29/2013   HCT 32.6* 08/29/2013   PLT 178 08/29/2013   GLUCOSE 121* 08/29/2013   ALT 48* 08/27/2013   AST 50* 08/27/2013   NA 139 08/29/2013   K 4.2 08/29/2013   CL 109 08/29/2013   CREATININE 0.63 08/29/2013   BUN 6 08/29/2013   CO2 21 08/29/2013   INR 1.64* 08/28/2013   HGBA1C 5.8* 08/27/2013   Remains sleepy, but opens eyses and follows commands, avoiding sedating meds On CPAP at home, now on [place] MD  Beeper [phone] Office [phone] 08/29/2013 4:22 PM

## 2013-08-29 NOTE — Progress Notes (Signed)
Pt had been drowsy throughout the shift.  Difficult to keep her awake even with continued stimulation from healthcare and family.  Pt followed commands with all extremeties throughout the shift.  However, was not able to perform incentive, cough and deep breath and would breath in a shallow pattern while sleeping.  ABG performed in the afternoon - elevated C02 and ph of 7.28 with a C02 of 54.  Pt was placed on the bipap at 70%.  Repeat ABG - ph of 7.18 and C02 of 64.  Pt was intubated by Dr. Elsworth Soho at [phone].  Pt was administered 2 mg versed, 100 mcg fentanyl and 53m atomidate.  Pt tolerated well.  OGT was placed and put to low intermittent suction.  Chest xray was performed to confirm ETT and OGT.  Prior to intubation, Dr. GServando Snarecalled the pt's husband to advise him of the need for intubation.  The husband was in agreement.    434mlasix is to be administered to the patient after the pt's sys BPs have stabilized from sedation.

## 2013-08-29 NOTE — Progress Notes (Addendum)
Prior to administering Tylenol and Lopressor down OG tube, auscultated for placement. Tube, per auscultation in appropriate spot. Administered meds. Approximately 5 minutes later, Maria [name] became restless in bed, then vomited roughly 20cc of red liquid (possibly Tylenol) out of her mouth. Immediately suctioned out mouth and airway. Lungs clear to auscultation. Rechecked OG tube placement; still in appropriate place. PRN Zofran given.   55- Spoke with Dr. Servando Snare about vomiting. No new orders at this time. [name]

## 2013-08-29 NOTE — Consult Note (Signed)
PULMONARY  / CRITICAL CARE MEDICINE  Name: Maria [name] MRN: [id number] DOB: [birth year]    ADMISSION DATE:  08/28/2013 CONSULTATION DATE:  08/29/2013  REFERRING MD :  gerhardt PRIMARY SERVICE: TCTS  CHIEF COMPLAINT:  somnolence  BRIEF PATIENT DESCRIPTION: 79 y.o POD #1 s/p CABG x 2 for CAD. 2-D  echo pre-op showed  moderate to severe mitral regurgitation with significant left atrial enlargement and moderate to severe pulmonary hypertension ( 55 mmHg ) Extubated 11/6 but dveloped increased somnolence, placed on bipap without improvement in ABG , hence  PCCM consulted Intra-op TEE showed mild MR post CABG  SIGNIFICANT EVENTS / STUDIES:    LINES / TUBES: ETT 11/5 >> 11/6, 11/6 (reintubated ) >>  CULTURES:   ANTIBIOTICS:   HISTORY OF PRESENT ILLNESS:  79 y.o POD #1 s/p CABG x 2 for CAD. 2-D  echo pre-op showed  moderate to severe mitral regurgitation with significant left atrial enlargement and moderate to severe pulmonary hypertension ( 55 mmHg ) Extubated 11/6 but dveloped increased somnolence, placed on bipap without improvement in ABG , hence  PCCM consulted Remained on low dose dopamine & milrinone, chest tube outputs ok. Intra-op TEE showed mild MR post CABG  PAST MEDICAL HISTORY :  Past Medical History  Diagnosis Date  . Osteoporosis   . Celiac disease   . Celiac disease   . Carotid artery occlusion     right carotid stenosis 40-60%, 4/08, neg for stenosis repeat study 11/11  . Depression   . Presbycusis   . Macular degeneration   . Urethral stricture   . Moderate aortic insufficiency   . Moderate mitral regurgitation by prior echocardiogram     echo 9/13   . Thyroid nodule     81m, no change 11/11  . RLS (restless legs syndrome)   . Vitamin D deficiency   . Heart murmur   . Pulmonary hypertension   . Hypertension     takes Metoprolol daily  . Atrial fibrillation   . Coronary artery disease   . CHF (congestive heart failure)     takes Lasix daily  .  MVP (mitral valve prolapse)   . Moderate obstructive sleep apnea     uses CPAP;sleep study about 4-564monthago  . Hearing loss     both ears  . Numbness     left arm  . Joint swelling   . GERD (gastroesophageal reflux disease)     takes Omeprazole daily  . Gastric ulcer     6-44m9monthgo  . GI bleeding   . Constipation   . Diarrhea   . Hemorrhoids   . Urinary retention   . Urinary anastomotic stricture   . Hypothyroidism     takes Synthroid daily as result of AMiodarone  . Hot flashes     takes ZOloft 3 times a week  . Insomnia     takes Melatonin nightly  . Restless leg   . Peripheral edema     left   Past Surgical History  Procedure Laterality Date  . Appendectomy    . Tubal ligation    . Tonsillectomy    . Trigger thumb    . Cardiac catheterization  08-06-13  . Tcs    . Bilateral cataract surgery    . Mandible surgery     Prior to Admission medications   Medication Sig Start Date End Date Taking? Authorizing Provider  amiodarone (PACERONE) 200 MG tablet Take 200 mg by mouth daily.   Yes  Historical Provider, MD  aspirin EC 81 MG tablet Take 81 mg by mouth daily.   Yes Historical Provider, MD  calcium-vitamin D (OSCAL WITH D) 500-200 MG-UNIT per tablet Take 1 tablet by mouth daily.   Yes Historical Provider, MD  cholecalciferol (VITAMIN D) 400 UNITS TABS tablet Take 400 Units by mouth daily.   Yes Historical Provider, MD  furosemide (LASIX) 20 MG tablet Take 40 mg by mouth daily.    Yes Historical Provider, MD  levothyroxine (SYNTHROID, LEVOTHROID) 50 MCG tablet Take 50 mcg by mouth daily before breakfast.   Yes Historical Provider, MD  losartan (COZAAR) 100 MG tablet Take 100 mg by mouth daily.    Yes Historical Provider, MD  magnesium oxide (MAG-OX) 400 MG tablet Take 400 mg by mouth daily.   Yes Historical Provider, MD  Melatonin 3 MG TABS Take 3 mg by mouth at bedtime.    Yes Historical Provider, MD  metoprolol succinate (TOPROL-XL) 50 MG 24 hr tablet Take 50 mg  by mouth daily. Take with or immediately following a meal.   Yes Historical Provider, MD  omeprazole (PRILOSEC) 20 MG capsule Take 20 mg by mouth daily.   Yes Historical Provider, MD  sertraline (ZOLOFT) 50 MG tablet Take 50 mg by mouth 3 (three) times a week. Tues, Thurs and Saturday   Yes Historical Provider, MD   Allergies  Allergen Reactions  . Amoxicillin     Nausea, dizziness  . Codeine Nausea And Vomiting  . Fosamax [Alendronate Sodium]     Jaw pain  . Gluten Meal     Celiacs  . Hctz [Hydrochlorothiazide]     Hyponatremia, GI upset  . Tetanus Toxoids     FAMILY HISTORY:  Family History  Problem Relation Age of Onset  . Heart attack Mother   . CVA Mother   . CAD Father   . Colon cancer Father   . Heart attack Father   . Heart attack Brother   . Peripheral vascular disease Brother   . AAA (abdominal aortic aneurysm) Brother    SOCIAL HISTORY:  reports that she has never smoked. She does not have any smokeless tobacco history on file. She reports that she does not drink alcohol or use illicit drugs.  REVIEW OF SYSTEMS:  unable  SUBJECTIVE:   VITAL SIGNS: Temp:  [96.4 F (35.8 C)-99.3 F (37.4 C)] 99 F (37.2 C) (11/05 1700) Pulse Rate:  [60-91] 90 (11/05 1700) Resp:  [10-22] 14 (11/05 1700) BP: (69-156)/(38-70) 150/67 mmHg (11/05 1700) SpO2:  [89 %-100 %] 95 % (11/05 1700) Arterial Line BP: (83-343)/(41-203) 127/54 mmHg (11/05 1700) FiO2 (%):  [40 %-70 %] 70 % (11/05 1610) Weight:  [55.7 kg (122 lb 12.7 oz)] 55.7 kg (122 lb 12.7 oz) (11/05 0500) HEMODYNAMICS: PAP: (22-62)/(8-37) 53/8 mmHg CO:  [2.3 L/min-4.2 L/min] 3.5 L/min CI:  [1.6 L/min/m2-2.8 L/min/m2] 2.3 L/min/m2 VENTILATOR SETTINGS: Vent Mode:  [-] SIMV;PSV FiO2 (%):  [40 %-70 %] 70 % Set Rate:  [4 bmp-12 bmp] 4 bmp Vt Set:  [400 mL] 400 mL PEEP:  [5 cmH20] 5 cmH20 Pressure Support:  [10 cmH20] 10 cmH20 Plateau Pressure:  [18 cmH20] 18 cmH20 INTAKE / OUTPUT: Intake/Output     11/04 0701 -  11/05 0700 11/05 0701 - 11/06 0700   I.V. (mL/kg) 3133.2 (56.3) 409.5 (7.4)   Blood 355    IV Piggyback 2100    Total Intake(mL/kg) 5588.2 (100.3) 409.5 (7.4)   Urine (mL/kg/hr) 5160 (3.9) 585 (0.9)  Blood 1370 (1)    Chest Tube 623 (0.5) 270 (0.4)   Total Output 7153 855   Net -1564.8 -445.5          PHYSICAL EXAMINATION: Gen. Acutely ill, well-nourished, in no distress, lethargic affect ENT - no lesions, no post nasal drip Neck: No JVD, no thyromegaly, no carotid bruits Lungs: no use of accessory muscles, no dullness to percussion, decreased  without rales or rhonchi  Cardiovascular: Rhythm regular, heart sounds  normal, no murmurs, no peripheral edema Abdomen: soft and non-tender, no hepatosplenomegaly, BS normal. Musculoskeletal: No deformities, no cyanosis or clubbing Neuro:  sleepy, non focal, on bipap Skin:  Warm, no lesions/ rash   LABS:  CBC Recent Labs     08/28/13  1841   08/29/13  0434  08/29/13  1700  08/29/13  1713  WBC  10.4   --   13.2*  23.4*   --   HGB  10.3*   < >  11.1*  11.4*  11.9*  HCT  30.0*   < >  32.6*  34.1*  35.0*  PLT  154   --   178  174   --    < > = values in this interval not displayed.   Coag's Recent Labs     08/27/13  1433  08/28/13  1233  APTT  30  37  INR  1.10  1.64*   BMET Recent Labs     08/27/13  1433   08/28/13  1857  08/29/13  0434  08/29/13  1700  08/29/13  1713  NA  124*   < >  135  139   --   142  K  4.4   < >  3.8  4.2   --   4.4  CL  94*   --   115*  109   --   105  CO2  18*   --    --   21   --    --   BUN  13   --   5*  6   --   6  CREATININE  0.72   < >  0.80  0.63  0.65  0.90  GLUCOSE  94   < >  149*  121*   --   145*   < > = values in this interval not displayed.   Electrolytes Recent Labs     08/27/13  1433  08/28/13  1841  08/29/13  0434  08/29/13  1700  CALCIUM  9.0   --   8.3*   --   MG   --   3.6*  2.7*  2.6*   Sepsis Markers No results found for this basename: LACTICACIDVEN,  PROCALCITON, O2SATVEN,  in the last 72 hours ABG Recent Labs     08/28/13  2256  08/29/13  0931  08/29/13  1543  PHART  7.303*  7.293*  7.240*  PCO2ART  43.8  44.4  53.6*  PO2ART  156.0*  83.0  64.0*   Liver Enzymes Recent Labs     08/27/13  1433  AST  50*  ALT  48*  ALKPHOS  90  BILITOT  0.6  ALBUMIN  3.8   Cardiac Enzymes No results found for this basename: TROPONINI, PROBNP,  in the last 72 hours Glucose Recent Labs     08/28/13  2034  08/29/13  0030  08/29/13  0413  08/29/13  0754  08/29/13  1159  08/29/13  1538  GLUCAP  111*  111*  118*  130*  109*  123*    Imaging Dg Chest Portable 1 View In Am  08/29/2013   CLINICAL DATA:  Post CABG  EXAM: PORTABLE CHEST - 1 VIEW  COMPARISON:  Portable exam 0632 hr compared to 08/28/2013  FINDINGS: Tip of endotracheal tube projects 3.7 cm above carinal.  Tip Swan-Ganz catheter projects over main pulmonary artery.  Nasogastric tube extends into stomach.  Mediastinal drain and thoracostomy tube noted.  Enlargement of cardiac silhouette post CABG.  Left atrial appendage occlusion device noted.  Atherosclerotic calcification aorta.  Minimal pulmonary edema with subsegmental atelectasis at left base.  No pneumothorax.  Bones demineralized.  IMPRESSION: Stable postoperative changes as above.   Electronically Signed   By: Lavonia Dana M.D.   On: 08/29/2013 08:03   Dg Chest Portable 1 View  08/28/2013   CLINICAL DATA:  Post CABG  EXAM: PORTABLE CHEST - 1 VIEW  COMPARISON:  08/27/2013  FINDINGS: Ppm status post CABG. NG tube crosses into the stomach with side hole above the level of the diaphragm. Left chest tube with no pneumothorax. Swan-Ganz central line with tip in the main pulmonary artery. Endotracheal tube with tip about 8 mm above the carinal. Mild mid to lower lung zone atelectasis on the left. No significant consolidation, effusion, or edema.  IMPRESSION: Acceptable postoperative appearance. Tip of endotracheal tube the only about 8 mm  above the carinal. See above description of NG tube as well.   Electronically Signed   By: Skipper Cliche M.D.   On: 08/28/2013 12:52     CXR: 11/5 stable post op changes  ASSESSMENT / PLAN:  PULMONARY A:Post op resp failure  - unclear cause ? Age, ? Residual anesthesia Pre-op pFTs ok except for low DLCO 40% ? pulm vasular disease P:   Intubate, has failed bipap PRVC ventilation  CARDIOVASCULAR A: CABG  Mod MR P:  Per TCTs ? Rpt echo for mitral valve On Amio & meoprolol pre op  RENAL A:  No issues P:   Consider low clearance  GASTROINTESTINAL A:  No issues P:   OG to gravity  HEMATOLOGIC A:  Acute blood loss anemia P:  Hb 10 & above ok  INFECTIOUS A:  No issues P:     ENDOCRINE A:  hypothyroid   P:   Rechk TSH  NEUROLOGIC A:  Pain P:   Fent prn, avoid benzos, use precedex if needed to enable rapid extubation Was non focal prior-  Doubt need for head CT for serial neuro checks  TODAY'S SUMMARY: Unclear cause of resp failure, doubt MR is worse , suspect residual anesthesia  I have personally obtained a history, examined the patient, evaluated laboratory and imaging results, formulated the assessment and plan and placed orders. CRITICAL CARE: The patient is critically ill with multiple organ systems failure and requires high complexity decision making for assessment and support, frequent evaluation and titration of therapies, application of advanced monitoring technologies and extensive interpretation of multiple databases. Critical Care Time devoted to patient care services described in this note is 45 minutes.   Rigoberto Noel  Pulmonary and [place] Pager: [phone]  08/29/2013, 6:14 PM

## 2013-08-30 ENCOUNTER — Inpatient Hospital Stay (HOSPITAL_COMMUNITY): Payer: Medicare Other

## 2013-08-30 ENCOUNTER — Encounter (HOSPITAL_COMMUNITY): Payer: Self-pay | Admitting: Cardiothoracic Surgery

## 2013-08-30 DIAGNOSIS — I10 Essential (primary) hypertension: Secondary | ICD-10-CM

## 2013-08-30 DIAGNOSIS — I059 Rheumatic mitral valve disease, unspecified: Secondary | ICD-10-CM

## 2013-08-30 DIAGNOSIS — I2510 Atherosclerotic heart disease of native coronary artery without angina pectoris: Principal | ICD-10-CM

## 2013-08-30 DIAGNOSIS — Z79899 Other long term (current) drug therapy: Secondary | ICD-10-CM

## 2013-08-30 DIAGNOSIS — R933 Abnormal findings on diagnostic imaging of other parts of digestive tract: Secondary | ICD-10-CM

## 2013-08-30 DIAGNOSIS — F329 Major depressive disorder, single episode, unspecified: Secondary | ICD-10-CM

## 2013-08-30 LAB — BASIC METABOLIC PANEL
BUN: 10 mg/dL (ref 6–23)
CO2: 23 mEq/L (ref 19–32)
Calcium: 8.3 mg/dL — ABNORMAL LOW (ref 8.4–10.5)
Chloride: 106 mEq/L (ref 96–112)
Creatinine, Ser: 0.72 mg/dL (ref 0.50–1.10)
GFR calc Af Amer: 87 mL/min — ABNORMAL LOW (ref >90)
GFR calc non Af Amer: 75 mL/min — ABNORMAL LOW (ref >90)
Glucose, Bld: 112 mg/dL — ABNORMAL HIGH (ref 70–99)
Potassium: 3 mEq/L — ABNORMAL LOW (ref 3.5–5.1)
Sodium: 139 mEq/L (ref 135–145)

## 2013-08-30 LAB — GLUCOSE, CAPILLARY
Glucose-Capillary: 99 mg/dL (ref 70–99)
Glucose-Capillary: 115 mg/dL — ABNORMAL HIGH (ref 70–99)
Glucose-Capillary: 110 mg/dL — ABNORMAL HIGH (ref 70–99)
Glucose-Capillary: 119 mg/dL — ABNORMAL HIGH (ref 70–99)
Glucose-Capillary: 100 mg/dL — ABNORMAL HIGH (ref 70–99)

## 2013-08-30 LAB — BLOOD GAS, ARTERIAL
Acid-Base Excess: 1.2 mmol/L (ref 0.0–2.0)
Bicarbonate: 24.3 mEq/L — ABNORMAL HIGH (ref 20.0–24.0)
Drawn by: 252031
FIO2: 0.4 %
MECHVT: 450 mL
O2 Saturation: 95.5 %
PEEP: 5 cmH2O
Patient temperature: 98.6
RATE: 18 resp/min
TCO2: 25.4 mmol/L (ref 0–100)
pCO2 arterial: 32.8 mmHg — ABNORMAL LOW (ref 35.0–45.0)
pH, Arterial: 7.484 — ABNORMAL HIGH (ref 7.350–7.450)
pO2, Arterial: 67.7 mmHg — ABNORMAL LOW (ref 80.0–100.0)

## 2013-08-30 LAB — CBC
HCT: 29.1 % — ABNORMAL LOW (ref 36.0–46.0)
Hemoglobin: 10.1 g/dL — ABNORMAL LOW (ref 12.0–15.0)
MCH: 32.3 pg (ref 26.0–34.0)
MCHC: 34.7 g/dL (ref 30.0–36.0)
MCV: 93 fL (ref 78.0–100.0)
Platelets: 143 10*3/uL — ABNORMAL LOW (ref 150–400)
RBC: 3.13 MIL/uL — ABNORMAL LOW (ref 3.87–5.11)
RDW: 14.6 % (ref 11.5–15.5)
WBC: 15.2 10*3/uL — ABNORMAL HIGH (ref 4.0–10.5)

## 2013-08-30 LAB — SEDIMENTATION RATE: Sed Rate: 6 mm/hr (ref 0–22)

## 2013-08-30 MED ORDER — INSULIN ASPART 100 UNIT/ML ~~LOC~~ SOLN: 0.0000 [IU] | SUBCUTANEOUS | Status: DC

## 2013-08-30 MED ORDER — ENOXAPARIN SODIUM 40 MG/0.4ML ~~LOC~~ SOLN
40.0000 mg | SUBCUTANEOUS | Status: DC
  Administered 2013-08-30 – 2013-09-07 (×9): 40 mg via SUBCUTANEOUS
  Filled 2013-08-30 (×11): qty 0.4

## 2013-08-30 MED ORDER — POTASSIUM CHLORIDE 10 MEQ/50ML IV SOLN
10.0000 meq | INTRAVENOUS | Status: AC
  Administered 2013-08-30 (×3): 10 meq via INTRAVENOUS
  Filled 2013-08-30 (×2): qty 50

## 2013-08-30 MED ORDER — FUROSEMIDE 10 MG/ML IJ SOLN
40.0000 mg | Freq: Once | INTRAMUSCULAR | Status: AC
  Administered 2013-08-30: 40 mg via INTRAVENOUS

## 2013-08-30 NOTE — Consult Note (Signed)
PULMONARY  / CRITICAL CARE MEDICINE  Name: Maria [name] MRN: [id number] DOB: 04-15-1924    ADMISSION DATE:  08/28/2013 CONSULTATION DATE:  08/30/2013  REFERRING MD :  gerhardt PRIMARY SERVICE: TCTS  CHIEF COMPLAINT:  somnolence  BRIEF PATIENT DESCRIPTION: 79 y.o POD #1 s/p CABG x 2 for CAD. 2-D  echo pre-op showed  moderate to severe mitral regurgitation with significant left atrial enlargement and moderate to severe pulmonary hypertension ( 55 mmHg ) Extubated 11/6 but dveloped increased somnolence, placed on bipap without improvement in ABG , hence  PCCM consulted Intra-op TEE showed mild MR post CABG  SIGNIFICANT EVENTS / STUDIES:  11/4- CABG 11/5- extubated 11/5- re intubated  LINES / TUBES: ETT 11/5 >> 11/6, 11/6 (reintubated ) >> 11/5 rt ij>>> 11/5 swan>>>  CULTURES:  ANTIBIOTICS:  SUBJECTIVE: follows commands  VITAL SIGNS: Temp:  [98.1 F (36.7 C)-100.2 F (37.9 C)] 99.5 F (37.5 C) (11/06 0807) Pulse Rate:  [58-91] 80 (11/06 0807) Resp:  [11-27] 18 (11/06 0807) BP: (69-156)/(38-70) 146/53 mmHg (11/06 0807) SpO2:  [89 %-100 %] 98 % (11/06 0807) Arterial Line BP: (83-343)/(34-203) 155/61 mmHg (11/06 0800) FiO2 (%):  [40 %-70 %] 40 % (11/06 0807) Weight:  [55.3 kg (121 lb 14.6 oz)] 55.3 kg (121 lb 14.6 oz) (11/06 0500) HEMODYNAMICS: PAP: (19-62)/(5-37) 35/17 mmHg CO:  [2.4 L/min-4.2 L/min] 3.5 L/min CI:  [1.6 L/min/m2-2.8 L/min/m2] 2.3 L/min/m2 VENTILATOR SETTINGS: Vent Mode:  [-] PRVC FiO2 (%):  [40 %-70 %] 40 % Set Rate:  [4 bmp-18 bmp] 18 bmp Vt Set:  [400 mL-450 mL] 450 mL PEEP:  [5 cmH20] 5 cmH20 Pressure Support:  [10 cmH20] 10 cmH20 Plateau Pressure:  [19 cmH20-22 cmH20] 22 cmH20 INTAKE / OUTPUT: Intake/Output     11/05 0701 - 11/06 0700 11/06 0701 - 11/07 0700   I.V. (mL/kg) 1374.8 (24.9) 48.9 (0.9)   Blood     NG/GT 250 60   IV Piggyback 100    Total Intake(mL/kg) 1724.8 (31.2) 108.9 (2)   Urine (mL/kg/hr) 1995 (1.5) 40 (0.6)   Blood      Chest Tube 460 (0.3) 20 (0.3)   Total Output 2455 60   Net -730.2 +48.9          PHYSICAL EXAMINATION: Gen. Calm on vent on,awake Neck:jvd up, line clean Lungs: mild ronchi clears with cough  Cardiovascular: Rhythm regular, heart sounds  normal, no murmurs, no peripheral edema Abdomen: soft and non-tender, no hepatosplenomegaly, BS normal. Musculoskeletal: No deformities, no cyanosis or clubbing Neuro:  sleepy, non focal, on bipap Skin:  Warm, no lesions/ rash   LABS:  CBC Recent Labs     08/29/13  0434  08/29/13  1700  08/29/13  1713  08/30/13  0330  WBC  13.2*  23.4*   --   15.2*  HGB  11.1*  11.4*  11.9*  10.1*  HCT  32.6*  34.1*  35.0*  29.1*  PLT  178  174   --   143*   Coag's Recent Labs     08/27/13  1433  08/28/13  1233  APTT  30  37  INR  1.10  1.64*   BMET Recent Labs     08/27/13  1433   08/28/13  1857  08/29/13  0434  08/29/13  1700  08/29/13  1713  NA  124*   < >  135  139   --   142  K  4.4   < >  3.8  4.2   --   4.4  CL  94*   --   115*  109   --   105  CO2  18*   --    --   21   --    --   BUN  13   --   5*  6   --   6  CREATININE  0.72   < >  0.80  0.63  0.65  0.90  GLUCOSE  94   < >  149*  121*   --   145*   < > = values in this interval not displayed.   Electrolytes Recent Labs     08/27/13  1433  08/28/13  1841  08/29/13  0434  08/29/13  1700  CALCIUM  9.0   --   8.3*   --   MG   --   3.6*  2.7*  2.6*   Sepsis Markers No results found for this basename: LACTICACIDVEN, PROCALCITON, O2SATVEN,  in the last 72 hours ABG Recent Labs     08/29/13  1916  08/29/13  1941  08/30/13  0335  PHART  7.395  7.298*  7.484*  PCO2ART  36.3  44.0  32.8*  PO2ART  71.8*  84.0  67.7*   Liver Enzymes Recent Labs     08/27/13  1433  AST  50*  ALT  48*  ALKPHOS  90  BILITOT  0.6  ALBUMIN  3.8   Cardiac Enzymes No results found for this basename: TROPONINI, PROBNP,  in the last 72 hours Glucose Recent Labs     08/29/13  0754   08/29/13  1159  08/29/13  1538  08/29/13  1945  08/29/13  2243  08/30/13  0337  GLUCAP  130*  109*  123*  136*  123*  99    Imaging Portable Chest Xray In Am  08/30/2013   CLINICAL DATA:  Evaluate endotracheal tube position  EXAM: PORTABLE CHEST - 1 VIEW  COMPARISON:  Portable chest x-ray of 08/29/2013  FINDINGS: The tip of the endotracheal tube is at least 2.2 cm above the chronic good position. Haziness at the lung bases is consistent with effusions right greater than left and basilar atelectasis. There is cardiomegaly present with perhaps minimal pulmonary vascular congestion noted. A Swan-Ganz catheter is unchanged and a left chest tube remains. There may be a very tiny left apical pneumothorax present  IMPRESSION: 1. The endotracheal tube tip at least 2.2 cm above the chronic good position. 2. Cardiomegaly, probable small effusions right greater than left and mild pulmonary vascular congestion. 3. Left chest tube with tiny left apical pneumothorax.   Electronically Signed   By: Ivar Drape M.D.   On: 08/30/2013 07:58   Dg Chest Port 1 View  08/29/2013   CLINICAL DATA:  89-year- female intubated. Initial encounter.  EXAM: PORTABLE CHEST - 1 VIEW  COMPARISON:  0632 hr the same day and earlier.  FINDINGS: Portable AP semi upright view at 1820 hrs.  Endotracheal tube tip in good position between the level the clavicles and carinal. Right IJ approach Swan-Ganz catheter, catheter tip at the level of the main pulmonary artery. Stable left chest tube. Enteric tube courses to the abdomen, tip not included.  Sequelae of CABG. No pneumothorax identified. Stable cardiac size and mediastinal contours. Improved bibasilar ventilation with small pleural effusions. No overt edema.  IMPRESSION: 1. Satisfactory placement of lines and tubes.  2. Improved bibasilar ventilation. Small pleural  effusions. No pneumothorax.   Electronically Signed   By: Lars Pinks M.D.   On: 08/29/2013 18:54   Dg Chest Portable 1 View In  Am  08/29/2013   CLINICAL DATA:  Post CABG  EXAM: PORTABLE CHEST - 1 VIEW  COMPARISON:  Portable exam 0632 hr compared to 08/28/2013  FINDINGS: Tip of endotracheal tube projects 3.7 cm above carinal.  Tip Swan-Ganz catheter projects over main pulmonary artery.  Nasogastric tube extends into stomach.  Mediastinal drain and thoracostomy tube noted.  Enlargement of cardiac silhouette post CABG.  Left atrial appendage occlusion device noted.  Atherosclerotic calcification aorta.  Minimal pulmonary edema with subsegmental atelectasis at left base.  No pneumothorax.  Bones demineralized.  IMPRESSION: Stable postoperative changes as above.   Electronically Signed   By: Lavonia Dana M.D.   On: 08/29/2013 08:03   Dg Chest Portable 1 View  08/28/2013   CLINICAL DATA:  Post CABG  EXAM: PORTABLE CHEST - 1 VIEW  COMPARISON:  08/27/2013  FINDINGS: Ppm status post CABG. NG tube crosses into the stomach with side hole above the level of the diaphragm. Left chest tube with no pneumothorax. Swan-Ganz central line with tip in the main pulmonary artery. Endotracheal tube with tip about 8 mm above the carinal. Mild mid to lower lung zone atelectasis on the left. No significant consolidation, effusion, or edema.  IMPRESSION: Acceptable postoperative appearance. Tip of endotracheal tube the only about 8 mm above the carinal. See above description of NG tube as well.   Electronically Signed   By: Skipper Cliche M.D.   On: 08/28/2013 12:52     CXR: line wnl, ett wnl, atx mild, small ptx rt, ct wnl  ASSESSMENT / PLAN:  PULMONARY A:Post op resp failure  - unclear cause ? Age, ? Residual anesthesia Pre-op pFTs ok except for low DLCO 40% ? pulm vasular disease Int lung changes july P:   If this is concern of resp strength, will assess NIF, VC Wean cpap 5 ps 5 goal 30 min Now more awake pcxr in am  Does not appear overloaded as cause, follow pa-d No role steroids Esr on amio with int changes even noted July Reduce MV on  rest if not extubated  CARDIOVASCULAR A: CABG  Mod MR P:  On Amio & meoprolol pre op Per cvts  RENAL A:  No issues P:   Consider low clearance, small mucle mass Chem in am   GASTROINTESTINAL A:  No issues P:   OG to gravity ppi  HEMATOLOGIC A:  Acute blood loss anemia P:  No role tx' scd  INFECTIOUS A:  At risk asp P:  Ancef post op per cvts Follow secretion status and pcxr in am   ENDOCRINE A:  hypothyroid   P:   Rechk TSH 3 , wnl ssi  NEUROLOGIC A:  Pain P:   Fent prn, use precedex if needed Nif, vc  TODAY'S SUMMARY more awake, nif, vc, wean  I have personally obtained a history, examined the patient, evaluated laboratory and imaging results, formulated the assessment and plan and placed orders. CRITICAL CARE: The patient is critically ill with multiple organ systems failure and requires high complexity decision making for assessment and support, frequent evaluation and titration of therapies, application of advanced monitoring technologies and extensive interpretation of multiple databases. Critical Care Time devoted to patient care services described in this note is 35 minutes.   Raylene Miyamoto  Pulmonary and [place] Pager: [phone])  [phone]  08/30/2013, 8:09 AM

## 2013-08-30 NOTE — Progress Notes (Addendum)
[facility] DAILY ICU PROGRESS NOTE                   [address]            [place] [address]          [phone]   2 Days Post-Op Procedure(s) (LRB): CORONARY ARTERY BYPASS GRAFTING (CABG) x2 using right greater saphenous vein and left internal mammary artery.  (N/A) INTRAOPERATIVE TRANSESOPHAGEAL ECHOCARDIOGRAM (N/A)  Total Length of Stay:  LOS: 2 days   Subjective: Awake on vent.  Appears more alert today than she did yesterday. Stable overnight.   Objective: Vital signs in last 24 hours: Temp:  [98.1 F (36.7 C)-100.2 F (37.9 C)] 99.5 F (37.5 C) (11/06 0700) Pulse Rate:  [58-91] 80 (11/06 0700) Cardiac Rhythm:  [-] A-V Sequential paced (11/06 0700) Resp:  [11-27] 18 (11/06 0700) BP: (69-156)/(38-70) 102/45 mmHg (11/06 0700) SpO2:  [89 %-100 %] 96 % (11/06 0700) Arterial Line BP: (83-343)/(34-203) 113/40 mmHg (11/06 0700) FiO2 (%):  [40 %-70 %] 40 % (11/06 0700) Weight:  [121 lb 14.6 oz (55.3 kg)] 121 lb 14.6 oz (55.3 kg) (11/06 0500)  Filed Weights   08/28/13 1245 08/29/13 0500 08/30/13 0500  Weight: 114 lb (51.71 kg) 122 lb 12.7 oz (55.7 kg) 121 lb 14.6 oz (55.3 kg)  PRE OP WEIGHT: 52 kg   Weight change: 7 lb 14.6 oz (3.59 kg)   Hemodynamic parameters for last 24 hours: PAP: (19-62)/(5-37) 19/7 mmHg CO:  [2.4 L/min-4.2 L/min] 3.5 L/min CI:  [1.6 L/min/m2-2.8 L/min/m2] 2.3 L/min/m2  Intake/Output from previous day: 11/05 0701 - 11/06 0700 In: 1718.2 [I.V.:1368.2; NG/GT:250; IV Piggyback:100] Out: 2455 [Urine:1995; Chest Tube:460]  CBGs 123-145-136-123-99    Current Meds: Scheduled Meds: . acetaminophen  1,000 mg Oral Q6H   Or  . acetaminophen (TYLENOL) oral liquid 160 mg/5 mL  1,000 mg Per Tube Q6H  . amiodarone  200 mg Oral Daily  . antiseptic oral rinse  15 mL Mouth Rinse QID  . aspirin EC  325 mg Oral Daily   Or  . aspirin  324 mg Per Tube Daily  . bisacodyl  10 mg Oral Daily   Or  . bisacodyl  10 mg Rectal Daily  .  chlorhexidine  15 mL Mouth Rinse BID  . docusate sodium  200 mg Oral Daily  . enoxaparin (LOVENOX) injection  30 mg Subcutaneous Q24H  . insulin aspart  0-24 Units Subcutaneous Q4H  . levothyroxine  50 mcg Oral QAC breakfast  . metoprolol tartrate  12.5 mg Oral BID   Or  . metoprolol tartrate  12.5 mg Per Tube BID  . pantoprazole  40 mg Oral Daily  . sertraline  50 mg Oral 3 times weekly  . sodium chloride  3 mL Intravenous Q12H   Continuous Infusions: . sodium chloride 20 mL/hr at 08/29/13 1900  . sodium chloride 10 mL/hr at 08/29/13 1900  . sodium chloride    . dexmedetomidine Stopped (08/28/13 1716)  . DOPamine 2 mcg/kg/min (08/30/13 0700)  . lactated ringers 20 mL/hr at 08/29/13 1900  . milrinone 0.3 mcg/kg/min (08/30/13 0700)  . nitroGLYCERIN Stopped (08/30/13 0715)  . phenylephrine (NEO-SYNEPHRINE) Adult infusion Stopped (08/29/13 1030)   PRN Meds:.fentaNYL, metoprolol, midazolam, morphine injection, ondansetron (ZOFRAN) IV, potassium chloride, sodium chloride, traMADol   Physical Exam: General appearance: alert and no distress Neurologic: Moves extremities, follows commands Heart: regular rate and rhythm, paced at 80 Lungs: diminished breath sounds bibasilar Extremities: No  significant LE edema Wound: Dressed and dry    Lab Results: CBC: Recent Labs  08/29/13 1700 08/29/13 1713 08/30/13 0330  WBC 23.4*  --  15.2*  HGB 11.4* 11.9* 10.1*  HCT 34.1* 35.0* 29.1*  PLT 174  --  143*   BMET:  Recent Labs  08/27/13 1433  08/29/13 0434 08/29/13 1700 08/29/13 1713  NA 124*  < > 139  --  142  K 4.4  < > 4.2  --  4.4  CL 94*  < > 109  --  105  CO2 18*  --  21  --   --   GLUCOSE 94  < > 121*  --  145*  BUN 13  < > 6  --  6  CREATININE 0.72  < > 0.63 0.65 0.90  CALCIUM 9.0  --  8.3*  --   --   < > = values in this interval not displayed.  PT/INR:  Recent Labs  08/28/13 1233  LABPROT 19.0*  INR 1.64*   Radiology: 08/30/2013 FINDINGS:  The tip of the  endotracheal tube is at least 2.2 cm above the  chronic good position. Haziness at the lung bases is consistent with  effusions right greater than left and basilar atelectasis. There is  cardiomegaly present with perhaps minimal pulmonary vascular  congestion noted. A Swan-Ganz catheter is unchanged and a left chest  tube remains. There may be a very tiny left apical pneumothorax  present  IMPRESSION:  1. The endotracheal tube tip at least 2.2 cm above the chronic good  position.  2. Cardiomegaly, probable small effusions right greater than left  and mild pulmonary vascular congestion.  3. Left chest tube with tiny left apical pneumothorax.    Dg Chest Port 1 View  08/29/2013   CLINICAL DATA:  89-year- female intubated. Initial encounter.  EXAM: PORTABLE CHEST - 1 VIEW  COMPARISON:  0632 hr the same day and earlier.  FINDINGS: Portable AP semi upright view at 1820 hrs.  Endotracheal tube tip in good position between the level the clavicles and carinal. Right IJ approach Swan-Ganz catheter, catheter tip at the level of the main pulmonary artery. Stable left chest tube. Enteric tube courses to the abdomen, tip not included.  Sequelae of CABG. No pneumothorax identified. Stable cardiac size and mediastinal contours. Improved bibasilar ventilation with small pleural effusions. No overt edema.  IMPRESSION: 1. Satisfactory placement of lines and tubes.  2. Improved bibasilar ventilation. Small pleural effusions. No pneumothorax.   Electronically Signed   By: Lars Pinks M.D.   On: 08/29/2013 18:54   Dg Chest Portable 1 View In Am  08/29/2013   CLINICAL DATA:  Post CABG  EXAM: PORTABLE CHEST - 1 VIEW  COMPARISON:  Portable exam 0632 hr compared to 08/28/2013  FINDINGS: Tip of endotracheal tube projects 3.7 cm above carinal.  Tip Swan-Ganz catheter projects over main pulmonary artery.  Nasogastric tube extends into stomach.  Mediastinal drain and thoracostomy tube noted.  Enlargement of cardiac silhouette  post CABG.  Left atrial appendage occlusion device noted.  Atherosclerotic calcification aorta.  Minimal pulmonary edema with subsegmental atelectasis at left base.  No pneumothorax.  Bones demineralized.  IMPRESSION: Stable postoperative changes as above.   Electronically Signed   By: Lavonia Dana M.D.   On: 08/29/2013 08:03   Dg Chest Portable 1 View  08/28/2013   CLINICAL DATA:  Post CABG  EXAM: PORTABLE CHEST - 1 VIEW  COMPARISON:  08/27/2013  FINDINGS: Ppm  status post CABG. NG tube crosses into the stomach with side hole above the level of the diaphragm. Left chest tube with no pneumothorax. Swan-Ganz central line with tip in the main pulmonary artery. Endotracheal tube with tip about 8 mm above the carinal. Mild mid to lower lung zone atelectasis on the left. No significant consolidation, effusion, or edema.  IMPRESSION: Acceptable postoperative appearance. Tip of endotracheal tube the only about 8 mm above the carinal. See above description of NG tube as well.   Electronically Signed   By: Skipper Cliche M.D.   On: 08/28/2013 12:52     TSH 3.025    Assessment/Plan: S/P Procedure(s) (LRB): CORONARY ARTERY BYPASS GRAFTING (CABG) x2 using right greater saphenous vein and left internal mammary artery.  (N/A) INTRAOPERATIVE TRANSESOPHAGEAL ECHOCARDIOGRAM (N/A)  Pulm- required reintubation yesterday, likely secondary to sedation.  Hopefully can wean from vent today.  CCM following.  CV- SR, rates 60s-70s under pacer.  BPs elevated.  On NTG, Dopamine, Milrinone. Wean drips today as able.  Mild postop volume overload- diurese as tolerated.  Expected postop blood loss anemia- H/H stable.  TSH in normal range- has been on Amio at home.  Continue ICU care.  Burke Keels 08/30/2013 7:58 AM  Much more alert this am, follows commands I have seen and examined [name] and agree with the above assessment  and plan.  Maria Isaac MD Beeper [phone] Office [phone] 08/30/2013  8:30 AM

## 2013-08-30 NOTE — Op Note (Signed)
Maria [name], Maria [name]             ACCOUNT NO.:  [id number]  MEDICAL RECORD NO.:  [id number]  LOCATION:  2S07C                        FACILITY:  [facility]  PHYSICIAN:  Lanelle Bal, MD    DATE OF BIRTH:  05/11/1924  DATE OF PROCEDURE:  08/28/2013 DATE OF DISCHARGE:                              OPERATIVE REPORT   PREOPERATIVE DIAGNOSES:  Severe 2-vessel coronary artery disease with unstable angina, history of atrial fibrillation, history of aortic insufficiency, and mitral insufficiency.  POSTOPERATIVE DIAGNOSES:  Severe 2-vessel coronary artery disease with unstable angina, history of atrial fibrillation, history of aortic insufficiency, and mitral insufficiency.  SURGICAL PROCEDURE:  Coronary artery bypass grafting x2 with left internal mammary to the left anterior descending coronary artery, and reverse saphenous vein graft to the circumflex coronary artery, and placement of a Accucare 40 mm left atrial clip with right leg endo vein harvesting.  SURGEON:  Lanelle Bal, MD  FIRST ASSISTANT:  Lars Pinks, PA  BRIEF HISTORY:  The patient is an 79 year old female, who had known atrial fibrillation 8 months previously and history of moderate mitral regurgitation and aortic regurgitation.  The patient was treated medically, was returned to a sinus rhythm and for a period of time felt better, however, recently began having increasing shortness of breath with exertion and chest discomfort.  Cardiac catheterization was performed, which showed mild pulmonary hypertension.  Did not demonstrate significant amount of mitral regurgitation, but she was found to have severe 2-vessel coronary artery disease involving the LAD and circumflex distribution, overall ventricular function was preserved. The right coronary artery was without disease.  Preop echocardiogram and intraoperative cardiograms were performed.  The TEE in the operating room with elevated blood pressures  demonstrated mild mitral regurgitation and mild aortic insufficiency.  With this in mind, we proceeded without replacement of the repair of the mitral valve.  There was no structural abnormality or flail leaflet.  At the completion of the procedure coming off bypass, there was trace mitral regurgitation. The risks and options of procedure were discussed with the patient preoperatively in detail including increased risk of intervention because of her age, however, she remained at 30 very active and independent and was having active symptoms.  She agreed and signed informed consent.  DESCRIPTION OF PROCEDURE:  With Swan-Ganz and arterial line monitors in place, the patient underwent general endotracheal anesthesia without incident.  Dr. Renato Battles placed a TEE probe and we carefully examined both aortic and mitral valve with TEE.  Specifics of these findings are in the TEE note and then proceeded with appropriate time-out.  Skin of the chest and legs were prepped with Betadine and draped in usual sterile manner.  Using Guidant endo vein harvesting system, vein was harvested from the right thigh and was of good quality.  Median sternotomy was performed.  Left internal mammary artery was dissected down as a pedicle graft.  The distal artery was divided had good free flow.  Pericardium was opened.  Overall ventricular function appeared preserved.  The patient was systemically heparinized.  Ascending aorta was cannulated. The right atrium was cannulated and aortic root vent cardioplegia needle was introduced into the ascending aorta.  The patient was placed on cardiopulmonary bypass  2.4 liters/minute/meter squared.  Sites of anastomosis were selected.  The LAD was easily visible.  The circumflex was partially intramyocardial and at the site checked for anastomosis was fairly high on the lateral wall.  Disposition made it difficult to consider off pump coronary artery bypass grafting.  The  patient's body temperature was allowed to drift to 32 degrees.  Aortic crossclamp was applied and cold blood cardioplegia was administered antegrade with rapid diastolic arrest of the heart.  Myocardial septal temperature was monitored throughout the crossclamp.  Attention was turned first to the obtuse marginal coronary artery which was opened and admitted a 1-1/2 mm probe.  Using a running 7-0 Prolene, distal anastomosis was performed. Additional cold blood cardioplegia was administered down the vein graft. The left atrial appendage was measured and a 40-mm Accucure atrial clip was selected and placed on the atrial appendage.  The attention was then turned to the left anterior descending coronary artery between the mid and distal LAD.  The vessel was opened.  Using a running 8-0 Prolene, left internal mammary artery was anastomosed to left anterior descending coronary artery.  With crossclamp still in place, a single punch aortotomy was performed and the vein graft to the circumflex was anastomosed to the ascending aorta.  The bulldog was removed from the mammary artery with prompt rise in myocardial septal temperature. Aortic crossclamp was removed with a total cross-clamp time of 50 minutes.  Sites of anastomosis were inspected and free of bleeding.  The patient was then ventilated and weaned from cardiopulmonary bypass without difficulty.  Again details of the post-pump TEE in the TEE note, Dr. Renato Battles carefully examined the mitral valve and there was very trivial mitral regurgitation.  The aortic insufficiency mild was unchanged.  Overall ventricular function was preserved.  The patient remained hemodynamically stable.  She was decannulated in usual fashion. Protamine sulfate was administered.  A left pleural tube and a Blake mediastinal drain were left in place.  Pericardium was loosely reapproximated.  Sternum closed #6 stainless steel wire.  Fascia closed with interrupted 0  Vicryl, running 3-0 Vicryl subcutaneous tissue, 4-0 subcuticular stitch in skin edges.  Dry dressings were applied.  Sponge and needle count was reported as correct at the completion of procedure. The patient tolerated the procedure without obvious complication and was transferred to the Surgical Intensive Care Unit for further postoperative care.  Total pump time was 70 minutes.  She did not require any blood bank, blood products during the operative procedure, with exception of Cell Saver.     Lanelle Bal, MD     EG/MEDQ  D:  08/29/2013  T:  08/30/2013  Job:  [id number]

## 2013-08-30 NOTE — Progress Notes (Signed)
[facility] Progress Note Patient Name: Maria [name] DOB: [birth year] MRN: [id number]  Date of Service  08/30/2013   HPI/Events of Note   [place], mechanics look excellent and improved, no apnea  eICU Interventions  extubatem follow closlely   Intervention Category Major Interventions: Airway management  Raylene Miyamoto. 08/30/2013, 4:11 PM

## 2013-08-30 NOTE — Procedures (Signed)
Extubation Procedure Note  Patient Details:   Name: KEENAN DIMITROV DOB: [birth year] MRN: [id number]   Airway Documentation:     Evaluation  O2 sats: stable throughout Complications: No apparent complications Patient did tolerate procedure well. Bilateral Breath Sounds: Clear;Diminished Suctioning: Airway Yes Patient tolerated wean. MD ordered to extubate. Positive for cuff leak. Extubated to a 3 Lpm nasal cannula. No signs of dyspnea or stridor. Instructed on use of IS. Patient achieved 700 at best. Patient resting comfortably. Will continue to monitor.  Myrtie Neither 08/30/2013, 4:31 PM

## 2013-08-30 NOTE — Progress Notes (Signed)
Vital capacity and NIF performed, VC 700 ml, NIF -30 with good patient effort.

## 2013-08-30 NOTE — Progress Notes (Signed)
INITIAL NUTRITION ASSESSMENT  DOCUMENTATION CODES Per approved criteria  -Not Applicable   INTERVENTION:  If EN started, recommend initiate of Vital AF 1.2 formula at 15 ml/hr -- increase by 10 ml every 4 hours to goal rate of 45 ml/hr to provide 1296 kcals, 81 gm protein, 876 ml of free water RD to follow for nutrition care plan  NUTRITION DIAGNOSIS: Inadequate oral intake related to inability to eat as evidenced by NPO status  Goal: Initiate EN support within next 24-48 hours if prolonged intubation expected  Monitor:  EN initiation & tolerance, respiratory status, weight, labs, I/O's  Reason for Assessment: VDRF  79 y.o. female  Admitting Dx: Coronary atherosclerosis of native coronary artery  ASSESSMENT: Patient with PMH of CHF, CAD, celiac disease and atrial fibrillation; patient extubated 11/6 but dveloped increased somnolence and placed on BiPAP without improvement in ABG; intra-op TEE showed mild MR post CABG.  Patient s/p procedures 11/4: INTRAOPERATIVE TRANSESOPHAGEAL ECHOCARDIOGRAM CORONARY ARTERY BYPASS GRAFTING (CABG)  Patient is currently intubated on ventilator support -- OGT in place MV: 6.3 Temp: 37.4  Height: Ht Readings from Last 1 Encounters:  08/28/13 5' 2.5" (1.588 m)    Weight: Wt Readings from Last 1 Encounters:  08/30/13 121 lb 14.6 oz (55.3 kg)    Ideal Body Weight: 110 lb  % Ideal Body Weight: 91%  Wt Readings from Last 10 Encounters:  08/30/13 121 lb 14.6 oz (55.3 kg)  08/30/13 121 lb 14.6 oz (55.3 kg)  08/27/13 112 lb 11.2 oz (51.12 kg)  08/20/13 114 lb (51.71 kg)  08/16/13 115 lb (52.164 kg)  08/16/13 115 lb (52.164 kg)  08/07/13 115 lb 6.4 oz (52.345 kg)    Usual Body Weight: 112 lb  % Usual Body Weight: 108%  BMI:  Body mass index is 21.93 kg/(m^2).  Estimated Nutritional Needs: Kcal: 1150-1300 Protein: 75-85 gm Fluid: per MD  Skin: Intact  Diet Order: NPO  EDUCATION NEEDS: -No education needs identified at  this time   Intake/Output Summary (Last 24 hours) at 08/30/13 1151 Last data filed at 08/30/13 1100  Gross per 24 hour  Intake 1616.23 ml  Output   2255 ml  Net -638.77 ml    Labs:   Recent Labs Lab 08/27/13 1433  08/28/13 1841  08/29/13 0434 08/29/13 1700 08/29/13 1713 08/30/13 0900  NA 124*  < >  --   < > 139  --  142 139  K 4.4  < >  --   < > 4.2  --  4.4 3.0*  CL 94*  --   --   < > 109  --  105 106  CO2 18*  --   --   --  21  --   --  23  BUN 13  --   --   < > 6  --  6 10  CREATININE 0.72  --  0.72  < > 0.63 0.65 0.90 0.72  CALCIUM 9.0  --   --   --  8.3*  --   --  8.3*  MG  --   --  3.6*  --  2.7* 2.6*  --   --   GLUCOSE 94  < >  --   < > 121*  --  145* 112*  < > = values in this interval not displayed.  CBG (last 3)   Recent Labs  08/29/13 2243 08/30/13 0337 08/30/13 0755  GLUCAP 123* 99 115*    Scheduled Meds: . acetaminophen  1,000 mg Oral Q6H   Or  . acetaminophen (TYLENOL) oral liquid 160 mg/5 mL  1,000 mg Per Tube Q6H  . amiodarone  200 mg Oral Daily  . antiseptic oral rinse  15 mL Mouth Rinse QID  . aspirin EC  325 mg Oral Daily   Or  . aspirin  324 mg Per Tube Daily  . bisacodyl  10 mg Oral Daily   Or  . bisacodyl  10 mg Rectal Daily  . chlorhexidine  15 mL Mouth Rinse BID  . docusate sodium  200 mg Oral Daily  . enoxaparin (LOVENOX) injection  30 mg Subcutaneous Q24H  . insulin aspart  0-24 Units Subcutaneous Q4H  . levothyroxine  50 mcg Oral QAC breakfast  . metoprolol tartrate  12.5 mg Oral BID   Or  . metoprolol tartrate  12.5 mg Per Tube BID  . pantoprazole  40 mg Oral Daily  . sertraline  50 mg Oral 3 times weekly  . sodium chloride  3 mL Intravenous Q12H    Continuous Infusions: . sodium chloride 20 mL/hr (08/30/13 1130)  . sodium chloride 10 mL/hr at 08/29/13 1900  . sodium chloride    . dexmedetomidine Stopped (08/28/13 1716)  . lactated ringers 20 mL/hr at 08/30/13 0800  . milrinone 0.3 mcg/kg/min (08/30/13 1100)  .  nitroGLYCERIN Stopped (08/30/13 1000)  . phenylephrine (NEO-SYNEPHRINE) Adult infusion 30 mcg/min (08/30/13 1130)    Past Medical History  Diagnosis Date  . Osteoporosis   . Celiac disease   . Celiac disease   . Carotid artery occlusion     right carotid stenosis 40-60%, 4/08, neg for stenosis repeat study 11/11  . Depression   . Presbycusis   . Macular degeneration   . Urethral stricture   . Moderate aortic insufficiency   . Moderate mitral regurgitation by prior echocardiogram     echo 9/13   . Thyroid nodule     29m, no change 11/11  . RLS (restless legs syndrome)   . Vitamin D deficiency   . Heart murmur   . Pulmonary hypertension   . Hypertension     takes Metoprolol daily  . Atrial fibrillation   . Coronary artery disease   . CHF (congestive heart failure)     takes Lasix daily  . MVP (mitral valve prolapse)   . Moderate obstructive sleep apnea     uses CPAP;sleep study about 4-564monthago  . Hearing loss     both ears  . Numbness     left arm  . Joint swelling   . GERD (gastroesophageal reflux disease)     takes Omeprazole daily  . Gastric ulcer     6-50m11monthgo  . GI bleeding   . Constipation   . Diarrhea   . Hemorrhoids   . Urinary retention   . Urinary anastomotic stricture   . Hypothyroidism     takes Synthroid daily as result of AMiodarone  . Hot flashes     takes ZOloft 3 times a week  . Insomnia     takes Melatonin nightly  . Restless leg   . Peripheral edema     left    Past Surgical History  Procedure Laterality Date  . Appendectomy    . Tubal ligation    . Tonsillectomy    . Trigger thumb    . Cardiac catheterization  08-06-13  . Tcs    . Bilateral cataract surgery    . Mandible surgery    .  Coronary artery bypass graft N/A 08/28/2013    Procedure: CORONARY ARTERY BYPASS GRAFTING (CABG) x2 using right greater saphenous vein and left internal mammary artery. ;  Surgeon: Grace Isaac, MD;  Location: [facility];  Service: Open Heart  Surgery;  Laterality: N/A;  . Intraoperative transesophageal echocardiogram N/A 08/28/2013    Procedure: INTRAOPERATIVE TRANSESOPHAGEAL ECHOCARDIOGRAM;  Surgeon: Grace Isaac, MD;  Location: [facility];  Service: Open Heart Surgery;  Laterality: N/A;    Arthur Holms, RD, LDN Pager #: [phone] After-Hours Pager #: [phone]

## 2013-08-30 NOTE — Progress Notes (Signed)
PT Cancellation Note  Patient Details Name: Maria [name] MRN: [id number] DOB: Oct 20, 1924   Cancelled Treatment:    Reason Eval/Treat Not Completed: Medical issues which prohibited therapy.  Pt still has Swann, ET tube and per RN will likely extubate sometime later today.  RN suggested waiting until tomorrow.  PT to f/u tomorrow.     Barbarann Ehlers [place], [place], DPT [phone]   08/30/2013, 4:03 PM

## 2013-08-30 NOTE — Progress Notes (Signed)
[facility] BRIEF SICU PROGRESS NOTE  2 Days Post-Op  S/P Procedure(s) (LRB): CORONARY ARTERY BYPASS GRAFTING (CABG) x2 using right greater saphenous vein and left internal mammary artery.  (N/A) INTRAOPERATIVE TRANSESOPHAGEAL ECHOCARDIOGRAM (N/A)   Extubated uneventfully this afternoon NSR/AAI paced BP stable O2 sats 99% on 3 L/min via [place] UOP adequate  Plan: Continue current plan  OWEN,CLARENCE H 08/30/2013 7:46 PM

## 2013-08-31 ENCOUNTER — Encounter (HOSPITAL_COMMUNITY): Payer: Self-pay | Admitting: Certified Registered Nurse Anesthetist

## 2013-08-31 ENCOUNTER — Inpatient Hospital Stay (HOSPITAL_COMMUNITY): Payer: Medicare Other

## 2013-08-31 DIAGNOSIS — R0609 Other forms of dyspnea: Secondary | ICD-10-CM

## 2013-08-31 LAB — COMPREHENSIVE METABOLIC PANEL
ALT: 43 U/L — ABNORMAL HIGH (ref 0–35)
AST: 59 U/L — ABNORMAL HIGH (ref 0–37)
Albumin: 3.1 g/dL — ABNORMAL LOW (ref 3.5–5.2)
Alkaline Phosphatase: 53 U/L (ref 39–117)
BUN: 16 mg/dL (ref 6–23)
Chloride: 105 mEq/L (ref 96–112)
GFR calc Af Amer: 85 mL/min — ABNORMAL LOW (ref >90)
Glucose, Bld: 116 mg/dL — ABNORMAL HIGH (ref 70–99)
Potassium: 4.2 mEq/L (ref 3.5–5.1)
Total Bilirubin: 0.5 mg/dL (ref 0.3–1.2)
Total Protein: 5.9 g/dL — ABNORMAL LOW (ref 6.0–8.3)

## 2013-08-31 LAB — CBC WITH DIFFERENTIAL/PLATELET
Basophils Absolute: 0 10*3/uL (ref 0.0–0.1)
Eosinophils Absolute: 0 10*3/uL (ref 0.0–0.7)
Eosinophils Relative: 0 % (ref 0–5)
Hemoglobin: 10.1 g/dL — ABNORMAL LOW (ref 12.0–15.0)
Lymphocytes Relative: 9 % — ABNORMAL LOW (ref 12–46)
MCHC: 33.7 g/dL (ref 30.0–36.0)
MCV: 94.9 fL (ref 78.0–100.0)
Monocytes Relative: 8 % (ref 3–12)
Platelets: 119 10*3/uL — ABNORMAL LOW (ref 150–400)
RDW: 14.9 % (ref 11.5–15.5)
WBC: 10.6 10*3/uL — ABNORMAL HIGH (ref 4.0–10.5)

## 2013-08-31 LAB — GLUCOSE, CAPILLARY
Glucose-Capillary: 111 mg/dL — ABNORMAL HIGH (ref 70–99)
Glucose-Capillary: 111 mg/dL — ABNORMAL HIGH (ref 70–99)
Glucose-Capillary: 112 mg/dL — ABNORMAL HIGH (ref 70–99)
Glucose-Capillary: 114 mg/dL — ABNORMAL HIGH (ref 70–99)
Glucose-Capillary: 129 mg/dL — ABNORMAL HIGH (ref 70–99)
Glucose-Capillary: 98 mg/dL (ref 70–99)

## 2013-08-31 MED ORDER — FUROSEMIDE 10 MG/ML IJ SOLN
INTRAMUSCULAR | Status: AC
  Filled 2013-08-31: qty 4

## 2013-08-31 MED ORDER — FUROSEMIDE 10 MG/ML IJ SOLN
40.0000 mg | Freq: Once | INTRAMUSCULAR | Status: AC
  Administered 2013-08-31: 40 mg via INTRAVENOUS

## 2013-08-31 NOTE — Progress Notes (Signed)
Patient NIF and VC done with good efforts. NIf -45 VC 850.

## 2013-08-31 NOTE — Progress Notes (Signed)
Better today. Extubated. Visited with Dr. Donna Christen and patient.

## 2013-08-31 NOTE — Anesthesia Postprocedure Evaluation (Signed)
  Anesthesia Post-op Note  Patient: Maria [name]  Procedure(s) Performed: Procedure(s): CORONARY ARTERY BYPASS GRAFTING (CABG) x2 using right greater saphenous vein and left internal mammary artery.  (N/A) INTRAOPERATIVE TRANSESOPHAGEAL ECHOCARDIOGRAM (N/A)  Patient Location: PACU and SICU  Anesthesia Type:General  Level of Consciousness: sedated  Airway and Oxygen Therapy: Patient remains intubated per anesthesia plan  Post-op Pain: mild  Post-op Assessment: Post-op Vital signs reviewed  Post-op Vital Signs: Reviewed  Complications: No apparent anesthesia complications

## 2013-08-31 NOTE — Progress Notes (Signed)
Physical Therapy Evaluation Patient Details Name: Maria [name] MRN: [id number] DOB: 12/06/1923 Today's Date: 08/31/2013 Time: 0388-8280 PT Time Calculation (min): 26 min  PT Assessment / Plan / Recommendation History of Present Illness  Pt admit for CABG x2.  Clinical Impression  Pt admitted with above. Pt currently with functional limitations due to the deficits listed below (see PT Problem List). Pt should progress to home with HHPT f/u.  May need a RW.   Pt will benefit from skilled PT to increase their independence and safety with mobility to allow discharge to the venue listed below.     PT Assessment  Patient needs continued PT services    Follow Up Recommendations  Home health PT;Supervision/Assistance - 24 hour                Equipment Recommendations  Rolling walker with 5" wheels         Frequency Min 3X/week    Precautions / Restrictions Precautions Precautions: Fall;Sternal Restrictions Weight Bearing Restrictions: No   Pertinent Vitals/Pain VSS, some sternal pain      Mobility  Bed Mobility Bed Mobility: Sit to Supine Sit to Supine: 1: +2 Total assist;HOB flat Sit to Supine: Patient Percentage: 10% Details for Bed Mobility Assistance: Cues for sternal precautions.  Transfers Transfers: Sit to Stand;Stand to Sit Sit to Stand: 1: +2 Total assist;Without upper extremity assist;From chair/3-in-1 Sit to Stand: Patient Percentage: 50% Stand to Sit: 1: +2 Total assist;Without upper extremity assist;To chair/3-in-1 Stand to Sit: Patient Percentage: 50% Details for Transfer Assistance: Pt held onto pilllow upon standing for sternal precautions.  Needed a boost to initiate sit to stand.   Ambulation/Gait Ambulation/Gait Assistance: 1: +2 Total assist Ambulation/Gait: Patient Percentage: 70% Ambulation Distance (Feet): 40 Feet Assistive device:  (pushing wheelchair) Ambulation/Gait Assistance Details: Pt ambulated pushing wheelchair with cues for  sequencing steps and wheelchair.  Pt taking small steps.  Nauseated with ambulation.  HAd to take several standing rest breaks.   Gait Pattern: Step-to pattern;Decreased stride length;Shuffle Gait velocity: decreased Stairs: No Wheelchair Mobility Wheelchair Mobility: No         PT Diagnosis: Acute pain;Generalized weakness  PT Problem List: Decreased activity tolerance;Decreased balance;Decreased knowledge of use of DME;Decreased mobility;Decreased safety awareness;Decreased knowledge of precautions;Pain PT Treatment Interventions: DME instruction;Gait training;Functional mobility training;Therapeutic activities;Therapeutic exercise;Balance training;Patient/family education     PT Goals(Current goals can be found in the care plan section) Acute Rehab PT Goals Patient Stated Goal: to go home PT Goal Formulation: With patient Time For Goal Achievement: 09/07/13 Potential to Achieve Goals: Good  Visit Information  Last PT Received On: 08/31/13 Assistance Needed: +2 History of Present Illness: Pt admit for CABG x2.       Prior [place] expects to be discharged to:: Private residence Living Arrangements: Spouse/significant other Available Help at Discharge: Available 24 hours/day;Family Type of Home: Independent living facility ([place]) Home Access: Stairs to enter Technical brewer of Steps: 1 Entrance Stairs-Rails: None Home Layout: One level Home Equipment:  (handicapped height toilet with rails ) Prior Function Level of Independence: Independent Communication Communication: No difficulties    Cognition  Cognition Arousal/Alertness: Awake/alert Behavior During Therapy: Flat affect Overall Cognitive Status: Within Functional Limits for tasks assessed    Extremity/Trunk Assessment Upper Extremity Assessment Upper Extremity Assessment: Defer to OT evaluation Lower Extremity Assessment Lower Extremity Assessment: Generalized  weakness Cervical / Trunk Assessment Cervical / Trunk Assessment: Normal   Balance Balance Balance Assessed: Yes Static Sitting Balance  Static Sitting - Balance Support: Bilateral upper extremity supported;Feet supported Static Sitting - Level of Assistance: 3: Mod assist Static Sitting - Comment/# of Minutes: Pt sat 3 min with posterior lean needing mod assist to steady.   Static Standing Balance Static Standing - Balance Support: Bilateral upper extremity supported;During functional activity Static Standing - Level of Assistance: 3: Mod assist Static Standing - Comment/# of Minutes: 2 min with RW with need for steadying assist secondary to LOB all directions.    End of Session PT - End of Session Equipment Utilized During Treatment: Gait belt Activity Tolerance: Patient limited by fatigue Patient left: in chair;with call bell/phone within reach;with family/visitor present Nurse Communication: Mobility status       Maria [name],Maria [name] 08/31/2013, 10:44 AM [facility] [phone] (pager)

## 2013-08-31 NOTE — Progress Notes (Signed)
Patient ID: Maria [name], female   DOB: 05-06-1924, 79 y.o.   MRN: [id number] TCTS DAILY ICU PROGRESS NOTE                   [address]            [place] [address]          [phone]   3 Days Post-Op Procedure(s) (LRB): CORONARY ARTERY BYPASS GRAFTING (CABG) x2 using right greater saphenous vein and left internal mammary artery.  (N/A) INTRAOPERATIVE TRANSESOPHAGEAL ECHOCARDIOGRAM (N/A)  Total Length of Stay:  LOS: 3 days   Subjective: Up in chair, more alert and talking  Objective: Vital signs in last 24 hours: Temp:  [97.4 F (36.3 C)-99.7 F (37.6 C)] 97.9 F (36.6 C) (11/07 0000) Pulse Rate:  [67-82] 67 (11/07 0700) Cardiac Rhythm:  [-] Atrial paced (11/07 0400) Resp:  [11-26] 26 (11/07 0700) BP: (65-169)/(33-66) 122/50 mmHg (11/07 0600) SpO2:  [95 %-100 %] 100 % (11/07 0700) Arterial Line BP: (70-190)/(33-89) 103/38 mmHg (11/07 0700) FiO2 (%):  [40 %] 40 % (11/06 1600)  Filed Weights   08/28/13 1245 08/29/13 0500 08/30/13 0500  Weight: 114 lb (51.71 kg) 122 lb 12.7 oz (55.7 kg) 121 lb 14.6 oz (55.3 kg)    Weight change:    Hemodynamic parameters for last 24 hours: PAP: (17-41)/(8-22) 35/19 mmHg CO:  [2.7 L/min-3.5 L/min] 2.7 L/min CI:  [1.8 L/min/m2-2.3 L/min/m2] 1.8 L/min/m2  Intake/Output from previous day: 11/06 0701 - 11/07 0700 In: 1349.9 [I.V.:1019.9; NG/GT:180; IV Piggyback:150] Out: 1445 [Urine:1075; Chest Tube:370]  Intake/Output this shift:    Current Meds: Scheduled Meds: . acetaminophen  1,000 mg Oral Q6H   Or  . acetaminophen (TYLENOL) oral liquid 160 mg/5 mL  1,000 mg Per Tube Q6H  . amiodarone  200 mg Oral Daily  . antiseptic oral rinse  15 mL Mouth Rinse QID  . aspirin EC  325 mg Oral Daily   Or  . aspirin  324 mg Per Tube Daily  . bisacodyl  10 mg Oral Daily   Or  . bisacodyl  10 mg Rectal Daily  . chlorhexidine  15 mL Mouth Rinse BID  . docusate sodium  200 mg Oral Daily  . enoxaparin (LOVENOX)  injection  40 mg Subcutaneous Q24H  . insulin aspart  0-24 Units Subcutaneous Q4H  . insulin aspart  0-24 Units Subcutaneous Q4H  . levothyroxine  50 mcg Oral QAC breakfast  . metoprolol tartrate  12.5 mg Oral BID   Or  . metoprolol tartrate  12.5 mg Per Tube BID  . pantoprazole  40 mg Oral Daily  . sertraline  50 mg Oral 3 times weekly  . sodium chloride  3 mL Intravenous Q12H   Continuous Infusions: . sodium chloride Stopped (08/30/13 1800)  . sodium chloride 10 mL/hr at 08/29/13 1900  . sodium chloride    . dexmedetomidine Stopped (08/28/13 1716)  . lactated ringers 20 mL/hr at 08/30/13 2000  . nitroGLYCERIN Stopped (08/30/13 1000)  . phenylephrine (NEO-SYNEPHRINE) Adult infusion Stopped (08/31/13 0300)   PRN Meds:.metoprolol, midazolam, ondansetron (ZOFRAN) IV, potassium chloride, sodium chloride, traMADol  General appearance: alert, cooperative and no distress Neurologic: intact Heart: regular rate and rhythm, S1, S2 normal, no murmur, click, rub or gallop Lungs: diminished breath sounds bibasilar, expiratory wheezing mild Abdomen: soft, non-tender; bowel sounds normal; no masses,  no organomegaly Extremities: extremities normal, atraumatic, no cyanosis or edema and Homans sign is negative, no sign of  DVT Wound: sternum stable  Lab Results: CBC: Recent Labs  08/30/13 0330 08/31/13 0425  WBC 15.2* 10.6*  HGB 10.1* 10.1*  HCT 29.1* 30.0*  PLT 143* 119*   BMET:  Recent Labs  08/30/13 0900 08/31/13 0425  NA 139 136  K 3.0* 4.2  CL 106 105  CO2 23 24  GLUCOSE 112* 116*  BUN 10 16  CREATININE 0.72 0.77  CALCIUM 8.3* 8.7    PT/INR:  Recent Labs  08/28/13 1233  LABPROT 19.0*  INR 1.64*   Radiology: Portable Chest Xray In Am  08/30/2013   CLINICAL DATA:  Evaluate endotracheal tube position  EXAM: PORTABLE CHEST - 1 VIEW  COMPARISON:  Portable chest x-ray of 08/29/2013  FINDINGS: The tip of the endotracheal tube is at least 2.2 cm above the chronic good  position. Haziness at the lung bases is consistent with effusions right greater than left and basilar atelectasis. There is cardiomegaly present with perhaps minimal pulmonary vascular congestion noted. A Swan-Ganz catheter is unchanged and a left chest tube remains. There may be a very tiny left apical pneumothorax present  IMPRESSION: 1. The endotracheal tube tip at least 2.2 cm above the chronic good position. 2. Cardiomegaly, probable small effusions right greater than left and mild pulmonary vascular congestion. 3. Left chest tube with tiny left apical pneumothorax.   Electronically Signed   By: Ivar Drape M.D.   On: 08/30/2013 07:58   Dg Chest Port 1 View  08/29/2013   CLINICAL DATA:  89-year- female intubated. Initial encounter.  EXAM: PORTABLE CHEST - 1 VIEW  COMPARISON:  0632 hr the same day and earlier.  FINDINGS: Portable AP semi upright view at 1820 hrs.  Endotracheal tube tip in good position between the level the clavicles and carinal. Right IJ approach Swan-Ganz catheter, catheter tip at the level of the main pulmonary artery. Stable left chest tube. Enteric tube courses to the abdomen, tip not included.  Sequelae of CABG. No pneumothorax identified. Stable cardiac size and mediastinal contours. Improved bibasilar ventilation with small pleural effusions. No overt edema.  IMPRESSION: 1. Satisfactory placement of lines and tubes.  2. Improved bibasilar ventilation. Small pleural effusions. No pneumothorax.   Electronically Signed   By: Lars Pinks M.D.   On: 08/29/2013 18:54     Assessment/Plan: S/P Procedure(s) (LRB): CORONARY ARTERY BYPASS GRAFTING (CABG) x2 using right greater saphenous vein and left internal mammary artery.  (N/A) INTRAOPERATIVE TRANSESOPHAGEAL ECHOCARDIOGRAM (N/A) Mobilize Diuresis d/c tubes/lines Continue foley due to diuresing patient, strict I&O and urinary output monitoring Slow improvement     Maria [name] B 08/31/2013 7:25 AM

## 2013-08-31 NOTE — Progress Notes (Signed)
Patient achieved a NIF of -22 and VC of .8 L.

## 2013-08-31 NOTE — Progress Notes (Signed)
PULMONARY  / CRITICAL CARE MEDICINE  Name: Maria [name] MRN: [id number] DOB: 11/13/1923    ADMISSION DATE:  08/28/2013 CONSULTATION DATE:  08/31/2013  REFERRING MD :  [place] PRIMARY SERVICE: TCTS  CHIEF COMPLAINT:  somnolence  BRIEF PATIENT DESCRIPTION:  79 yo female s/p CABG developed altered mental status from hypercapnic respiratory failure requiring intubation.  PCCM consulted to assist with respiratory management.  SIGNIFICANT EVENTS: 11/4 CABG 11/5 VDRF  STUDIES:  11/3 PFT >> FEV1 1.20 (83%), FEV1% 70, TLC 4.12 (88%), DLCO 44%  LINES / TUBES: ETT 11/5 >> 11/6 Rt IJ CVL 11/5 >>   SUBJECTIVE: C/o nausea.  Otherwise feels breathing okay.  VITAL SIGNS: Temp:  [97.4 F (36.3 C)-99.7 F (37.6 C)] 97.5 F (36.4 C) (11/07 0817) Pulse Rate:  [66-82] 66 (11/07 1000) Resp:  [11-26] 19 (11/07 1000) BP: (73-140)/(39-66) 116/49 mmHg (11/07 1000) SpO2:  [97 %-100 %] 99 % (11/07 1000) Arterial Line BP: (103-165)/(38-89) 125/49 mmHg (11/07 0900) FiO2 (%):  [40 %] 40 % (11/06 1600) Weight:  [118 lb 9.7 oz (53.8 kg)] 118 lb 9.7 oz (53.8 kg) (11/07 0500) 2 liters [place]  INTAKE / OUTPUT: Intake/Output     11/06 0701 - 11/07 0700 11/07 0701 - 11/08 0700   I.V. (mL/kg) 1019.9 (19) 60 (1.1)   NG/GT 180    IV Piggyback 150    Total Intake(mL/kg) 1349.9 (25.1) 60 (1.1)   Urine (mL/kg/hr) 1075 (0.8) 345 (1.6)   Chest Tube 370 (0.3) 90 (0.4)   Total Output 1445 435   Net -95.1 -375          PHYSICAL EXAMINATION: Gen: no distress HEENT: No sinus tenderness Lungs: no wheeze Cardiovascular: regualr Abdomen: soft, non tender Musculoskeletal: no edema Neuro: awake, alert Skin:  No rashes   LABS:  CBC Recent Labs     08/29/13  1700  08/29/13  1713  08/30/13  0330  08/31/13  0425  WBC  23.4*   --   15.2*  10.6*  HGB  11.4*  11.9*  10.1*  10.1*  HCT  34.1*  35.0*  29.1*  30.0*  PLT  174   --   143*  119*   Coag's Recent Labs     08/28/13  1233  APTT  37  INR   1.64*   BMET Recent Labs     08/29/13  0434   08/29/13  1713  08/30/13  0900  08/31/13  0425  NA  139   --   142  139  136  K  4.2   --   4.4  3.0*  4.2  CL  109   --   105  106  105  CO2  21   --    --   23  24  BUN  6   --   6  10  16   CREATININE  0.63   < >  0.90  0.72  0.77  GLUCOSE  121*   --   145*  112*  116*   < > = values in this interval not displayed.   Electrolytes Recent Labs     08/28/13  1841  08/29/13  0434  08/29/13  1700  08/30/13  0900  08/31/13  0425  CALCIUM   --   8.3*   --   8.3*  8.7  MG  3.6*  2.7*  2.6*   --    --    ABG Recent Labs  08/29/13  1916  08/29/13  1941  08/30/13  0335  PHART  7.395  7.298*  7.484*  PCO2ART  36.3  44.0  32.8*  PO2ART  71.8*  84.0  67.7*   Liver Enzymes Recent Labs     08/31/13  0425  AST  59*  ALT  43*  ALKPHOS  53  BILITOT  0.5  ALBUMIN  3.1*   Glucose Recent Labs     08/30/13  1142  08/30/13  1604  08/30/13  2000  08/31/13  0016  08/31/13  0402  08/31/13  0814  GLUCAP  110*  119*  100*  114*  112*  129*    Imaging Dg Chest Port 1 View  08/31/2013   CLINICAL DATA:  Postop cardiac surgery  EXAM: PORTABLE CHEST - 1 VIEW  COMPARISON:  Prior chest x-ray 08/30/2013  FINDINGS: Interval extubation and removal of nasogastric tube. The Swan-Ganz catheter has been removed although the right IJ approach vascular sheath remains in unchanged position. Is the mediastinal drain has been removed. The left thoracostomy tube remains in position. Patient is status post median sternotomy with evidence of multivessel CABG and a left atrial appendage occlusion. Stable cardiac and mediastinal contours. Atherosclerosis noted in the transverse aorta. Slightly increased bilateral pleural effusions and associated bibasilar atelectasis. Mild vascular congestion without overt edema. No pneumothorax. .  IMPRESSION: 1. Interval extubation, removal of nasogastric tube, Swan-Ganz catheter and mediastinal drain. Right IJ vascular  sheath and left chest tube remain in stable position. 2. Slightly enlarged right greater than left pleural effusions with associated bibasilar atelectasis. 3. Stable mild vascular congestion without overt edema.   Electronically Signed   By: Jacqulynn Cadet M.D.   On: 08/31/2013 07:52   Portable Chest Xray In Am  08/30/2013   CLINICAL DATA:  Evaluate endotracheal tube position  EXAM: PORTABLE CHEST - 1 VIEW  COMPARISON:  Portable chest x-ray of 08/29/2013  FINDINGS: The tip of the endotracheal tube is at least 2.2 cm above the chronic good position. Haziness at the lung bases is consistent with effusions right greater than left and basilar atelectasis. There is cardiomegaly present with perhaps minimal pulmonary vascular congestion noted. A Swan-Ganz catheter is unchanged and a left chest tube remains. There may be a very tiny left apical pneumothorax present  IMPRESSION: 1. The endotracheal tube tip at least 2.2 cm above the chronic good position. 2. Cardiomegaly, probable small effusions right greater than left and mild pulmonary vascular congestion. 3. Left chest tube with tiny left apical pneumothorax.   Electronically Signed   By: Ivar Drape M.D.   On: 08/30/2013 07:58   Dg Chest Port 1 View  08/29/2013   CLINICAL DATA:  89-year- female intubated. Initial encounter.  EXAM: PORTABLE CHEST - 1 VIEW  COMPARISON:  0632 hr the same day and earlier.  FINDINGS: Portable AP semi upright view at 1820 hrs.  Endotracheal tube tip in good position between the level the clavicles and carinal. Right IJ approach Swan-Ganz catheter, catheter tip at the level of the main pulmonary artery. Stable left chest tube. Enteric tube courses to the abdomen, tip not included.  Sequelae of CABG. No pneumothorax identified. Stable cardiac size and mediastinal contours. Improved bibasilar ventilation with small pleural effusions. No overt edema.  IMPRESSION: 1. Satisfactory placement of lines and tubes.  2. Improved bibasilar  ventilation. Small pleural effusions. No pneumothorax.   Electronically Signed   By: Lars Pinks M.D.   On: 08/29/2013 18:54  ASSESSMENT / PLAN:  A: Hypercapnic respiratory failure >> likely from sedation/pain meds/anesthesia >> resolved. Isolated diffusion defect on pre-op PFT ? Significance >> has been on amiodarone. Hx of OSA. P:   -oxygen to keep SpO2 > 92% -f/u CXR -may need repeat PFT's as outpt  A:  Hx of CAD s/p CABG. Hx of A fib, mod MR, HTN P:  -per TCTS  A: Anemia. P: -f/u CBC  A: Hx of hypothyroidism. P: -continue synthroid  A:  Post op pain control P:   -Try to limit narcotics as tolerated  Summary: Respiratory status improved.  PCCM will sign off.  Please call if additional help needed.  Chesley Mires, MD [facility] Pulmonary/Critical Care 08/31/2013, 11:09 AM Pager:  [phone] After 3pm call: [phone]

## 2013-08-31 NOTE — Progress Notes (Signed)
PM ROUNDS  Up in chair currently, still having a lot of nausea  Did ambulate this afternoon  BP 127/47  Pulse 65  Temp(Src) 97.6 F (36.4 C) (Oral)  Resp 19  Ht 5' 2.5" (1.588 m)  Wt 118 lb 9.7 oz (53.8 kg)  BMI 21.33 kg/m2  SpO2 97%   Intake/Output Summary (Last 24 hours) at 08/31/13 1904 Last data filed at 08/31/13 1900  Gross per 24 hour  Intake  776.5 ml  Output   1280 ml  Net -503.5 ml    Continue present care

## 2013-09-01 ENCOUNTER — Inpatient Hospital Stay (HOSPITAL_COMMUNITY): Payer: Medicare Other

## 2013-09-01 LAB — CBC
HCT: 30.5 % — ABNORMAL LOW (ref 36.0–46.0)
Hemoglobin: 10.2 g/dL — ABNORMAL LOW (ref 12.0–15.0)
MCH: 31.7 pg (ref 26.0–34.0)
MCHC: 33.4 g/dL (ref 30.0–36.0)
MCV: 94.7 fL (ref 78.0–100.0)
Platelets: 155 10*3/uL (ref 150–400)
RBC: 3.22 MIL/uL — ABNORMAL LOW (ref 3.87–5.11)
RDW: 14.9 % (ref 11.5–15.5)
WBC: 9.1 10*3/uL (ref 4.0–10.5)

## 2013-09-01 LAB — BASIC METABOLIC PANEL
BUN: 27 mg/dL — ABNORMAL HIGH (ref 6–23)
CO2: 27 mEq/L (ref 19–32)
Calcium: 8.8 mg/dL (ref 8.4–10.5)
Chloride: 103 mEq/L (ref 96–112)
Creatinine, Ser: 0.84 mg/dL (ref 0.50–1.10)
GFR calc Af Amer: 70 mL/min — ABNORMAL LOW (ref >90)
GFR calc non Af Amer: 61 mL/min — ABNORMAL LOW (ref >90)
Glucose, Bld: 105 mg/dL — ABNORMAL HIGH (ref 70–99)
Potassium: 4.7 mEq/L (ref 3.5–5.1)
Sodium: 136 mEq/L (ref 135–145)

## 2013-09-01 LAB — GLUCOSE, CAPILLARY
Glucose-Capillary: 100 mg/dL — ABNORMAL HIGH (ref 70–99)
Glucose-Capillary: 87 mg/dL (ref 70–99)
Glucose-Capillary: 100 mg/dL — ABNORMAL HIGH (ref 70–99)
Glucose-Capillary: 104 mg/dL — ABNORMAL HIGH (ref 70–99)
Glucose-Capillary: 100 mg/dL — ABNORMAL HIGH (ref 70–99)

## 2013-09-01 MED ORDER — BIOTENE DRY MOUTH MT LIQD
15.0000 mL | Freq: Two times a day (BID) | OROMUCOSAL | Status: DC
  Administered 2013-09-01 – 2013-09-08 (×9): 15 mL via OROMUCOSAL

## 2013-09-01 MED ORDER — SODIUM CHLORIDE 0.9 % IV SOLN
INTRAVENOUS | Status: DC
  Administered 2013-09-01: 10:00:00 via INTRAVENOUS

## 2013-09-01 MED ORDER — SODIUM CHLORIDE 0.9 % IV SOLN
INTRAVENOUS | Status: AC
  Administered 2013-09-01: 05:00:00 via INTRAVENOUS

## 2013-09-01 MED ORDER — METOCLOPRAMIDE HCL 5 MG/ML IJ SOLN
10.0000 mg | Freq: Four times a day (QID) | INTRAMUSCULAR | Status: AC
  Administered 2013-09-01 – 2013-09-02 (×4): 10 mg via INTRAVENOUS
  Filled 2013-09-01 (×4): qty 2

## 2013-09-01 MED ORDER — INSULIN ASPART 100 UNIT/ML ~~LOC~~ SOLN: 0.0000 [IU] | Freq: Three times a day (TID) | SUBCUTANEOUS | Status: DC

## 2013-09-01 NOTE — Progress Notes (Signed)
PM ROUNDS  Nausea a little better'  BP 133/53  Pulse 62  Temp(Src) 97.2 F (36.2 C) (Oral)  Resp 23  Ht 5' 2.5" (1.588 m)  Wt 118 lb 13.3 oz (53.9 kg)  BMI 21.37 kg/m2  SpO2 96%   Intake/Output Summary (Last 24 hours) at 09/01/13 1752 Last data filed at 09/01/13 1500  Gross per 24 hour  Intake 1479.92 ml  Output    450 ml  Net 1029.92 ml    PO intake still poor - continue IVF

## 2013-09-01 NOTE — Plan of Care (Signed)
Problem: Problem: Diet/Nutrition Progression Goal: ADEQUATE NUTRITION Outcome: Not Progressing Remains on clear liquids related to nausea. reglan started today.

## 2013-09-01 NOTE — Plan of Care (Signed)
Problem: Phase II Progression Outcomes Goal: Tolerates liquids without nausea/vomiting Outcome: Progressing Not as nauseated with sips of clear liquids. Reglan started today.  Problem: Phase III Progression Outcomes Goal: Ambulates with pain/dyspnea controlled Outcome: Progressing Ambulated 200 ft this am and had slight shortness of breath walking on 1 L O2 nasal cannula. Goal: O2 Sat remains > or equal to 90% on room air Outcome: Progressing Is 500. Remains on O2 at 1 L/min on nasal cannula. Goal: CBGs/Blood glucose < or equal to 120 Outcome: Completed/Met Date Met:  09/01/13 cbgs changed to ac and hs. Goal: Advance to regular diet without nausea Outcome: Not Progressing Continues on clear liquids secondary to nausea.

## 2013-09-01 NOTE — Progress Notes (Signed)
4 Days Post-Op Procedure(s) (LRB): CORONARY ARTERY BYPASS GRAFTING (CABG) x2 using right greater saphenous vein and left internal mammary artery.  (N/A) INTRAOPERATIVE TRANSESOPHAGEAL ECHOCARDIOGRAM (N/A) Subjective: Still has a poor appetite "I think I'm getting some wheat in the diet" Pain well controlled  Objective: Vital signs in last 24 hours: Temp:  [97.4 F (36.3 C)-97.9 F (36.6 C)] 97.4 F (36.3 C) (11/08 0756) Pulse Rate:  [58-72] 62 (11/08 0800) Cardiac Rhythm:  [-] Normal sinus rhythm (11/08 0830) Resp:  [17-25] 22 (11/08 0800) BP: (84-146)/(43-121) 144/53 mmHg (11/08 0800) SpO2:  [91 %-99 %] 96 % (11/08 0800) Weight:  [118 lb 13.3 oz (53.9 kg)] 118 lb 13.3 oz (53.9 kg) (11/08 0500)  Hemodynamic parameters for last 24 hours:    Intake/Output from previous day: 11/07 0701 - 11/08 0700 In: 700 [P.O.:220; I.V.:480] Out: 1030 [Urine:940; Chest Tube:90] Intake/Output this shift: Total I/O In: 20 [I.V.:20] Out: 15 [Urine:15]  General appearance: alert and cooperative Neurologic: intact Heart: regular rate and rhythm Lungs: diminished breath sounds bibasilar Abdomen: soft, nontender Wound: clean and dry  Lab Results:  Recent Labs  08/31/13 0425 09/01/13 0415  WBC 10.6* 9.1  HGB 10.1* 10.2*  HCT 30.0* 30.5*  PLT 119* 155   BMET:  Recent Labs  08/31/13 0425 09/01/13 0415  NA 136 136  K 4.2 4.7  CL 105 103  CO2 24 27  GLUCOSE 116* 105*  BUN 16 27*  CREATININE 0.77 0.84  CALCIUM 8.7 8.8    PT/INR: No results found for this basename: LABPROT, INR,  in the last 72 hours ABG    Component Value Date/Time   PHART 7.484* 08/30/2013 0335   HCO3 24.3* 08/30/2013 0335   TCO2 25.4 08/30/2013 0335   ACIDBASEDEF 5.0* 08/29/2013 1941   O2SAT 95.5 08/30/2013 0335   CBG (last 3)   Recent Labs  08/31/13 2006 09/01/13 0403 09/01/13 0752  GLUCAP 98 100* 87    Assessment/Plan: S/P Procedure(s) (LRB): CORONARY ARTERY BYPASS GRAFTING (CABG) x2 using right  greater saphenous vein and left internal mammary artery.  (N/A) INTRAOPERATIVE TRANSESOPHAGEAL ECHOCARDIOGRAM (N/A) POD # 4 CABG Continues to progress slowly CV- stable, maintianing SR  RESP- continue pulmonary hygiene  RENAL- UO down overnight, BUN up , creatinine stable  I think she is mildly prerenal despite her weight being above baseline  Her PO intake is marginal- will give 250 ml saline and increase LR to 75 / hour  GI- + nausea, no BM - will try Reglan x 4 doses  Ambulate  CBG well controlled, change to AC/HS   LOS: 4 days    Myrah Strawderman C 09/01/2013

## 2013-09-02 LAB — GLUCOSE, CAPILLARY
Glucose-Capillary: 101 mg/dL — ABNORMAL HIGH (ref 70–99)
Glucose-Capillary: 95 mg/dL (ref 70–99)
Glucose-Capillary: 96 mg/dL (ref 70–99)
Glucose-Capillary: 85 mg/dL (ref 70–99)
Glucose-Capillary: 108 mg/dL — ABNORMAL HIGH (ref 70–99)

## 2013-09-02 LAB — BASIC METABOLIC PANEL
BUN: 24 mg/dL — ABNORMAL HIGH (ref 6–23)
CO2: 25 mEq/L (ref 19–32)
Creatinine, Ser: 0.71 mg/dL (ref 0.50–1.10)
GFR calc Af Amer: 87 mL/min — ABNORMAL LOW (ref >90)
GFR calc non Af Amer: 75 mL/min — ABNORMAL LOW (ref >90)

## 2013-09-02 LAB — CBC
HCT: 29.8 % — ABNORMAL LOW (ref 36.0–46.0)
Hemoglobin: 9.9 g/dL — ABNORMAL LOW (ref 12.0–15.0)
MCHC: 33.2 g/dL (ref 30.0–36.0)
MCV: 94.6 fL (ref 78.0–100.0)
RDW: 14.8 % (ref 11.5–15.5)
WBC: 7.6 10*3/uL (ref 4.0–10.5)

## 2013-09-02 MED ORDER — METOCLOPRAMIDE HCL 5 MG/ML IJ SOLN
10.0000 mg | Freq: Four times a day (QID) | INTRAMUSCULAR | Status: AC
  Administered 2013-09-02 – 2013-09-03 (×4): 10 mg via INTRAVENOUS
  Filled 2013-09-02 (×4): qty 2

## 2013-09-02 MED ORDER — GUAIFENESIN ER 600 MG PO TB12
600.0000 mg | ORAL_TABLET | Freq: Two times a day (BID) | ORAL | Status: DC | PRN
  Filled 2013-09-02: qty 1

## 2013-09-02 MED ORDER — MOVING RIGHT ALONG BOOK
Freq: Once | Status: AC
  Administered 2013-09-02: 18:00:00
  Filled 2013-09-02 (×2): qty 1

## 2013-09-02 MED ORDER — SODIUM CHLORIDE 0.9 % IV SOLN: 250.0000 mL | INTRAVENOUS | Status: DC | PRN

## 2013-09-02 MED ORDER — SODIUM CHLORIDE 0.9 % IJ SOLN
3.0000 mL | Freq: Two times a day (BID) | INTRAMUSCULAR | Status: DC
  Administered 2013-09-03 – 2013-09-07 (×8): 3 mL via INTRAVENOUS
  Administered 2013-09-07: 22:00:00 via INTRAVENOUS

## 2013-09-02 MED ORDER — SODIUM CHLORIDE 0.9 % IJ SOLN: 3.0000 mL | INTRAMUSCULAR | Status: DC | PRN

## 2013-09-02 MED ORDER — MAGNESIUM HYDROXIDE 400 MG/5ML PO SUSP: 30.0000 mL | Freq: Every day | ORAL | Status: DC | PRN

## 2013-09-02 NOTE — Progress Notes (Signed)
5 Days Post-Op Procedure(s) (LRB): CORONARY ARTERY BYPASS GRAFTING (CABG) x2 using right greater saphenous vein and left internal mammary artery.  (N/A) INTRAOPERATIVE TRANSESOPHAGEAL ECHOCARDIOGRAM (N/A) Subjective: Still having some nausea Able to eat a small amount of breakfast  Objective: Vital signs in last 24 hours: Temp:  [97.2 F (36.2 C)-98.3 F (36.8 C)] 97.7 F (36.5 C) (11/09 0817) Pulse Rate:  [58-65] 60 (11/09 0700) Cardiac Rhythm:  [-] Normal sinus rhythm (11/09 0750) Resp:  [15-37] 19 (11/09 0700) BP: (109-154)/(42-77) 139/55 mmHg (11/09 0700) SpO2:  [92 %-100 %] 96 % (11/09 0700) Weight:  [123 lb 9.6 oz (56.065 kg)] 123 lb 9.6 oz (56.065 kg) (11/09 0500)  Hemodynamic parameters for last 24 hours:    Intake/Output from previous day: 11/08 0701 - 11/09 0700 In: 2507.9 [P.O.:635; I.V.:1872.9] Out: 635 [Urine:635] Intake/Output this shift:    General appearance: alert and no distress Neurologic: intact Heart: regular rate and rhythm Lungs: diminished breath sounds bibasilar Abdomen: normal findings: soft, non-tender  Lab Results:  Recent Labs  09/01/13 0415 09/02/13 0320  WBC 9.1 7.6  HGB 10.2* 9.9*  HCT 30.5* 29.8*  PLT 155 186   BMET:  Recent Labs  09/01/13 0415 09/02/13 0320  NA 136 134*  K 4.7 3.9  CL 103 101  CO2 27 25  GLUCOSE 105* 98  BUN 27* 24*  CREATININE 0.84 0.71  CALCIUM 8.8 8.6    PT/INR: No results found for this basename: LABPROT, INR,  in the last 72 hours ABG    Component Value Date/Time   PHART 7.484* 08/30/2013 0335   HCO3 24.3* 08/30/2013 0335   TCO2 25.4 08/30/2013 0335   ACIDBASEDEF 5.0* 08/29/2013 1941   O2SAT 95.5 08/30/2013 0335   CBG (last 3)   Recent Labs  09/01/13 1631 09/01/13 2057 09/02/13 0815  GLUCAP 100* 101* 95    Assessment/Plan: S/P Procedure(s) (LRB): CORONARY ARTERY BYPASS GRAFTING (CABG) x2 using right greater saphenous vein and left internal mammary artery.  (N/A) INTRAOPERATIVE  TRANSESOPHAGEAL ECHOCARDIOGRAM (N/A) POD # 5 CABG Continues to be slow to progress Nausea is a little better but still a problem' Appetite remains poor but was able to eat a little bit of breakfast CV- stable  RESP- continue IS  Renal- prerenal azotemia better  Will decrease IVF to 50 ml/hr  CBG well controlled  Will transfer to PTCU when bed available   LOS: 5 days    [name] C 09/02/2013

## 2013-09-02 NOTE — Plan of Care (Signed)
Problem: Phase III Progression Outcomes Goal: Time patient transferred to PCTU/Telemetry POD Outcome: Completed/Met Date Met:  09/02/13 1640

## 2013-09-02 NOTE — Plan of Care (Signed)
Problem: Phase III Progression Outcomes Goal: Transfer to PCTU/Telemetry POD Outcome: Completed/Met Date Met:  09/02/13 Transferred to 2W22 via wheelchair with monitor and O2 1L nasal cannula after report called to [place] Nurse, RN. Park and walk completed. Tolerated well. Up to chair.

## 2013-09-02 NOTE — Plan of Care (Signed)
Problem: Problem: Diet/Nutrition Progression Goal: ADEQUATE NUTRITION Outcome: Progressing Changed to gluten free, carbohydrate modified diet.

## 2013-09-03 ENCOUNTER — Telehealth: Payer: Self-pay | Admitting: Cardiothoracic Surgery

## 2013-09-03 ENCOUNTER — Encounter: Payer: Self-pay | Admitting: Interventional Cardiology

## 2013-09-03 ENCOUNTER — Inpatient Hospital Stay (HOSPITAL_COMMUNITY): Payer: Medicare Other

## 2013-09-03 DIAGNOSIS — E441 Mild protein-calorie malnutrition: Secondary | ICD-10-CM

## 2013-09-03 LAB — BASIC METABOLIC PANEL
Chloride: 101 mEq/L (ref 96–112)
GFR calc Af Amer: >90 mL/min (ref >90)
Potassium: 3.5 mEq/L (ref 3.5–5.1)
Sodium: 133 mEq/L — ABNORMAL LOW (ref 135–145)

## 2013-09-03 LAB — CBC
HCT: 32 % — ABNORMAL LOW (ref 36.0–46.0)
Hemoglobin: 10.8 g/dL — ABNORMAL LOW (ref 12.0–15.0)
RBC: 3.44 MIL/uL — ABNORMAL LOW (ref 3.87–5.11)
WBC: 6.4 10*3/uL (ref 4.0–10.5)

## 2013-09-03 LAB — GLUCOSE, CAPILLARY
Glucose-Capillary: 81 mg/dL (ref 70–99)
Glucose-Capillary: 97 mg/dL (ref 70–99)
Glucose-Capillary: 85 mg/dL (ref 70–99)
Glucose-Capillary: 95 mg/dL (ref 70–99)

## 2013-09-03 MED ORDER — POTASSIUM CHLORIDE CRYS ER 20 MEQ PO TBCR
40.0000 meq | EXTENDED_RELEASE_TABLET | Freq: Two times a day (BID) | ORAL | Status: AC
  Administered 2013-09-03 (×2): 40 meq via ORAL
  Filled 2013-09-03 (×2): qty 2

## 2013-09-03 MED ORDER — FUROSEMIDE 40 MG PO TABS
40.0000 mg | ORAL_TABLET | Freq: Every day | ORAL | Status: AC
  Administered 2013-09-03: 40 mg via ORAL
  Filled 2013-09-03: qty 1

## 2013-09-03 MED ORDER — ENSURE COMPLETE PO LIQD
237.0000 mL | Freq: Two times a day (BID) | ORAL | Status: DC
  Administered 2013-09-04 – 2013-09-07 (×4): 237 mL via ORAL

## 2013-09-03 NOTE — Progress Notes (Signed)
PT Cancellation Note  Patient Details Name: Maria [name] MRN: [id number] DOB: May 20, 1924   Cancelled Treatment:    Reason Eval/Treat Not Completed: Fatigue limiting ability to participate (Pt had been up all day and had amb with cardiac rehab and to bathroom multiple times.)   [facility] 09/03/2013, 2:27 PM

## 2013-09-03 NOTE — Progress Notes (Signed)
Walking hallway. Tele - NSR, no AFIB Creat 0.51 Post op CABG Notes reviewed

## 2013-09-03 NOTE — Progress Notes (Signed)
Pt converted to afib while ambulating with CR this morning, rate controlled 80--90s, pt only c/o "feeling jittery", Marin Comment made aware, no new orders received, will continue to monitor closely. Renee Pain

## 2013-09-03 NOTE — Progress Notes (Signed)
CARDIAC REHAB PHASE I   PRE:  Rate/Rhythm: 64 SR    BP: sitting 137/57    SaO2: 96 1L, 88-91 Ra  MODE:  Ambulation: 150 ft   POST:  Rate/Rhythm: 116 afib    BP: sitting 151/71     SaO2: 94 2L  Pt c/o weakness this am. Lacked core strength getting to edge of bed.  Upon standing pt needed to assist to stand upright and close to walker due to kyphosis. Fairly steady walking, rest x2 due to fatigue, assist x2 with gait belt and 2L O2.  C/o wooziness. Upon entering room noted pt now in afib. C/o some wooziness and nausea. To recliner. Sts she feels jittery. Will continue to follow. [phone]  Darrick Meigs CES, ACSM 09/03/2013 8:46 AM

## 2013-09-03 NOTE — Progress Notes (Addendum)
[address]       [place] [address]             [phone]      6 Days Post-Op  Procedure(s) (LRB): CORONARY ARTERY BYPASS GRAFTING (CABG) x2 using right greater saphenous vein and left internal mammary artery.  (N/A) INTRAOPERATIVE TRANSESOPHAGEAL ECHOCARDIOGRAM (N/A) Subjective: Feels tired, but overall improved, some loose stools  Objective  Telemetry sinus rhythm  Temp:  [97.3 F (36.3 C)-98.2 F (36.8 C)] 98.2 F (36.8 C) (11/10 0425) Pulse Rate:  [59-65] 63 (11/10 0425) Resp:  [17-28] 18 (11/10 0425) BP: (123-153)/(49-83) 151/73 mmHg (11/10 0425) SpO2:  [92 %-98 %] 94 % (11/10 0425) Weight:  [115 lb 9.6 oz (52.436 kg)] 115 lb 9.6 oz (52.436 kg) (11/10 0425)   Intake/Output Summary (Last 24 hours) at 09/03/13 0754 Last data filed at 09/03/13 0555  Gross per 24 hour  Intake 1452.92 ml  Output    316 ml  Net 1136.92 ml       General appearance: alert, cooperative and no distress Heart: regular rate and rhythm Lungs: dim in bases Abdomen: benign Extremities: no edema Wound: incis healing well  Lab Results:  Recent Labs  09/01/13 0415 09/02/13 0320  NA 136 134*  K 4.7 3.9  CL 103 101  CO2 27 25  GLUCOSE 105* 98  BUN 27* 24*  CREATININE 0.84 0.71  CALCIUM 8.8 8.6   No results found for this basename: AST, ALT, ALKPHOS, BILITOT, PROT, ALBUMIN,  in the last 72 hours No results found for this basename: LIPASE, AMYLASE,  in the last 72 hours  Recent Labs  09/02/13 0320 09/03/13 0703  WBC 7.6 6.4  HGB 9.9* 10.8*  HCT 29.8* 32.0*  MCV 94.6 93.0  PLT 186 247   No results found for this basename: CKTOTAL, CKMB, TROPONINI,  in the last 72 hours No components found with this basename: POCBNP,  No results found for this basename: DDIMER,  in the last 72 hours No results found for this basename: HGBA1C,  in the last 72 hours No results found for this basename: CHOL, HDL, LDLCALC, TRIG, CHOLHDL,  in the last 72 hours No  results found for this basename: TSH, T4TOTAL, FREET3, T3FREE, THYROIDAB,  in the last 72 hours No results found for this basename: VITAMINB12, FOLATE, FERRITIN, TIBC, IRON, RETICCTPCT,  in the last 72 hours  Medications: Scheduled . amiodarone  200 mg Oral Daily  . antiseptic oral rinse  15 mL Mouth Rinse BID  . aspirin EC  325 mg Oral Daily   Or  . aspirin  324 mg Per Tube Daily  . bisacodyl  10 mg Oral Daily   Or  . bisacodyl  10 mg Rectal Daily  . docusate sodium  200 mg Oral Daily  . enoxaparin (LOVENOX) injection  40 mg Subcutaneous Q24H  . insulin aspart  0-9 Units Subcutaneous TID WC  . levothyroxine  50 mcg Oral QAC breakfast  . metoprolol tartrate  12.5 mg Oral BID   Or  . metoprolol tartrate  12.5 mg Per Tube BID  . pantoprazole  40 mg Oral Daily  . sertraline  50 mg Oral 3 times weekly  . sodium chloride  3 mL Intravenous Q12H     Radiology/Studies:  Dg Chest Port 1 View  09/03/2013   CLINICAL DATA:  CABG.  EXAM: PORTABLE CHEST - 1 VIEW  COMPARISON:  09/01/2013.  FINDINGS: Interim removal of right IJ line.  Previously identified tiny left apical pneumothorax is no longer identified. Mediastinum and hilar structures are unremarkable. Mild cardiomegaly. Prior CABG. Mild pulmonary vascular prominence with bilateral pleural effusions. A component of congestive heart failure should be considered. Basilar atelectasis and or pneumonia cannot be excluded.  IMPRESSION: 1. Previously identified left apical pneumothorax is no longer identified. 2. Interim removal of right IJ tube. 3. Pulmonary interstitial prominence with bilateral pleural effusions. Component of CHF should be considered. Prior CABG.   Electronically Signed   By: Marcello Moores  Register   On: 09/03/2013 07:25    INR: Will add last result for INR, ABG once components are confirmed Will add last 4 CBG results once components are confirmed  Assessment/Plan: S/P Procedure(s) (LRB): CORONARY ARTERY BYPASS GRAFTING (CABG) x2  using right greater saphenous vein and left internal mammary artery.  (N/A) INTRAOPERATIVE TRANSESOPHAGEAL ECHOCARDIOGRAM (N/A)  1 conts to improve 2 labs stable, replace K+(3.5 today) 3 cont gentle diuresis/ pulm toilet/rehab, stop Lactated ringers 4 rhythm stable   LOS: 6 days    GOLD,Maria [name] 11/10/20147:54 AM  Taking diet better Waking up to 130 feet In afib this am rate controlled Poss rehab later in week I have seen and examined Maria [name] and agree with the above assessment  and plan.  Grace Isaac MD Beeper [phone] Office [phone] 09/03/2013 9:20 AM

## 2013-09-03 NOTE — Progress Notes (Signed)
NUTRITION FOLLOW UP  Intervention:    Ensure Complete twice daily (350 kcals, 13 gm protein per 8 fl oz bottle) -- is gluten free RD to follow for nutrition care plan  Nutrition Dx:   Inadequate oral intake now related to poor appetite as evidenced by patient report, ongoing  New Goal:   Pt to meet >/= 90% of their estimated nutrition needs, unmet  Monitor:   PO & supplemental intake, weight, labs, I/O's  Assessment:   Patient with PMH of CHF, CAD, celiac disease and atrial fibrillation; patient extubated 11/6 but dveloped increased somnolence and placed on BiPAP without improvement in ABG; intra-op TEE showed mild MR post CABG.   Patient s/p procedures 11/4:  INTRAOPERATIVE TRANSESOPHAGEAL ECHOCARDIOGRAM  CORONARY ARTERY BYPASS GRAFTING (CABG)  Patient extubated 11/6.  Patient reports her appetite "is gone;"  PO intake variable at 0-75% per flowsheet records.  RD offered Ensure Complete supplements -- patient agreeable to trying.  Height: Ht Readings from Last 1 Encounters:  08/28/13 5' 2.5" (1.588 m)    Weight Status:   Wt Readings from Last 1 Encounters:  09/03/13 115 lb 9.6 oz (52.436 kg)    Re-estimated needs:  Kcal: 1400-1600 Protein: 70-80 gm Fluid: >/= 1.5 L  Skin: chest & leg incisions   Diet Order: Gluten Restricted   Intake/Output Summary (Last 24 hours) at 09/03/13 1116 Last data filed at 09/03/13 0900  Gross per 24 hour  Intake 1195.84 ml  Output    171 ml  Net 1024.84 ml    Labs:   Recent Labs Lab 08/28/13 1841  08/29/13 0434 08/29/13 1700  09/01/13 0415 09/02/13 0320 09/03/13 0703  NA  --   < > 139  --   < > 136 134* 133*  K  --   < > 4.2  --   < > 4.7 3.9 3.5  CL  --   < > 109  --   < > 103 101 101  CO2  --   --  21  --   < > 27 25 25   BUN  --   < > 6  --   < > 27* 24* 13  CREATININE 0.72  < > 0.63 0.65  < > 0.84 0.71 0.51  CALCIUM  --   --  8.3*  --   < > 8.8 8.6 8.4  MG 3.6*  --  2.7* 2.6*  --   --   --   --   GLUCOSE  --   < >  121*  --   < > 105* 98 88  < > = values in this interval not displayed.  CBG (last 3)   Recent Labs  09/02/13 2103 09/03/13 0609 09/03/13 1102  GLUCAP 108* 81 97    Scheduled Meds: . amiodarone  200 mg Oral Daily  . antiseptic oral rinse  15 mL Mouth Rinse BID  . aspirin EC  325 mg Oral Daily   Or  . aspirin  324 mg Per Tube Daily  . bisacodyl  10 mg Oral Daily   Or  . bisacodyl  10 mg Rectal Daily  . docusate sodium  200 mg Oral Daily  . enoxaparin (LOVENOX) injection  40 mg Subcutaneous Q24H  . furosemide  40 mg Oral Daily  . insulin aspart  0-9 Units Subcutaneous TID WC  . levothyroxine  50 mcg Oral QAC breakfast  . metoprolol tartrate  12.5 mg Oral BID   Or  . metoprolol tartrate  12.5 mg Per Tube BID  . pantoprazole  40 mg Oral Daily  . potassium chloride  40 mEq Oral BID  . sertraline  50 mg Oral 3 times weekly  . sodium chloride  3 mL Intravenous Q12H    Continuous Infusions:   Arthur Holms, RD, LDN Pager #: [phone] After-Hours Pager #: [phone]

## 2013-09-03 NOTE — Clinical Documentation Improvement (Signed)
THIS DOCUMENT IS NOT A PERMANENT PART OF THE MEDICAL RECORD  Please update your documentation with the medical record to reflect your response to this query. If you need help knowing how to do this please call [phone].  09/03/13   Dear Dr.Landy Dunnavant / Associates,  In a better effort to capture your patient's severity of illness, reflect appropriate length of stay and utilization of resources, a review of the patient medical record has revealed the following indicators.    Based on your clinical judgment, please clarify and document in a progress note and/or discharge summary the clinical condition associated with the following supporting information:  In responding to this query please exercise your independent judgment.  The fact that a query is asked, does not imply that any particular answer is desired or expected.   Possible Clinical Conditions?  ___x____Mild Malnutrition 91 % ibw _______Moderate Malnutrition _______Severe Malnutrition   _______Protein Calorie Malnutrition _______Severe Protein Calorie Malnutrition  _______Other Condition _______Cannot clinically determine     Supporting Information: Risk Factors:  Signs & Symptoms: Ht: 5' 2.5"     Wt: 115 lb 9.6 oz   Treatment: Ensure complete twice daily  Nutrition Consult: 11/10: Inadequate oral intake now related to poor appetite as evidenced by patient report.   You may use possible, probable, or suspect with inpatient documentation. possible, probable, suspected diagnoses MUST be documented at the time of discharge  Reviewed: additional documentation in the medical record see problem list  Thank You,  Theron Arista, Clinical Documentation Specialist: [facility]

## 2013-09-03 NOTE — Telephone Encounter (Signed)
Error

## 2013-09-04 DIAGNOSIS — I4891 Unspecified atrial fibrillation: Secondary | ICD-10-CM

## 2013-09-04 LAB — GLUCOSE, CAPILLARY
Glucose-Capillary: 75 mg/dL (ref 70–99)
Glucose-Capillary: 112 mg/dL — ABNORMAL HIGH (ref 70–99)
Glucose-Capillary: 93 mg/dL (ref 70–99)
Glucose-Capillary: 92 mg/dL (ref 70–99)

## 2013-09-04 MED ORDER — LEVOTHYROXINE SODIUM 25 MCG PO TABS
25.0000 ug | ORAL_TABLET | Freq: Every day | ORAL | Status: DC
  Administered 2013-09-05 – 2013-09-08 (×4): 25 ug via ORAL
  Filled 2013-09-04 (×6): qty 1

## 2013-09-04 MED ORDER — MENTHOL 3 MG MT LOZG
1.0000 | LOZENGE | OROMUCOSAL | Status: DC | PRN
  Administered 2013-09-04: 3 mg via ORAL
  Filled 2013-09-04 (×2): qty 9

## 2013-09-04 MED ORDER — AMIODARONE HCL 200 MG PO TABS
200.0000 mg | ORAL_TABLET | Freq: Two times a day (BID) | ORAL | Status: DC
  Administered 2013-09-04 – 2013-09-07 (×7): 200 mg via ORAL
  Filled 2013-09-04 (×8): qty 1

## 2013-09-04 MED ORDER — METOPROLOL TARTRATE 25 MG PO TABS
25.0000 mg | ORAL_TABLET | Freq: Two times a day (BID) | ORAL | Status: DC
  Administered 2013-09-04 – 2013-09-05 (×3): 25 mg via ORAL
  Filled 2013-09-04 (×4): qty 1

## 2013-09-04 NOTE — Progress Notes (Signed)
Pt. C/o of "feeling wet", on assessment realized pt. Chest tube sites were draining through dressing and onto pt bed.  Dressing was changed using 4x4 and ABD with paper tape for pacing wires.  Pt. Resting in bed, call light in reach, RN will continue to monitor.   Nolon Nations, RN

## 2013-09-04 NOTE — Progress Notes (Addendum)
[address]       [place] [address]             [phone]      7 Days Post-Op  Procedure(s) (LRB): CORONARY ARTERY BYPASS GRAFTING (CABG) x2 using right greater saphenous vein and left internal mammary artery.  (N/A) INTRAOPERATIVE TRANSESOPHAGEAL ECHOCARDIOGRAM (N/A) Subjective: Feels ok, mild nausea  Objective  Telemetry afib, increased rate@times    Temp:  [98.1 F (36.7 C)-98.6 F (37 C)] 98.1 F (36.7 C) (11/11 0500) Pulse Rate:  [90-97] 97 (11/11 0500) Resp:  [18-19] 18 (11/11 0500) BP: (130-151)/(64-75) 130/73 mmHg (11/11 0500) SpO2:  [95 %-97 %] 97 % (11/11 0500) Weight:  [111 lb 15.9 oz (50.8 kg)] 111 lb 15.9 oz (50.8 kg) (11/11 0500)   Intake/Output Summary (Last 24 hours) at 09/04/13 0806 Last data filed at 09/04/13 0551  Gross per 24 hour  Intake    720 ml  Output      1 ml  Net    719 ml       General appearance: alert, cooperative and no distress Heart: irregularly irregular rhythm Lungs: dim in bases Abdomen: soft, mild dist, nontender Extremities: no edema Wound: incisions healing well  Lab Results:  Recent Labs  09/02/13 0320 09/03/13 0703  NA 134* 133*  K 3.9 3.5  CL 101 101  CO2 25 25  GLUCOSE 98 88  BUN 24* 13  CREATININE 0.71 0.51  CALCIUM 8.6 8.4   No results found for this basename: AST, ALT, ALKPHOS, BILITOT, PROT, ALBUMIN,  in the last 72 hours No results found for this basename: LIPASE, AMYLASE,  in the last 72 hours  Recent Labs  09/02/13 0320 09/03/13 0703  WBC 7.6 6.4  HGB 9.9* 10.8*  HCT 29.8* 32.0*  MCV 94.6 93.0  PLT 186 247   No results found for this basename: CKTOTAL, CKMB, TROPONINI,  in the last 72 hours No components found with this basename: POCBNP,  No results found for this basename: DDIMER,  in the last 72 hours No results found for this basename: HGBA1C,  in the last 72 hours No results found for this basename: CHOL, HDL, LDLCALC, TRIG, CHOLHDL,  in the last 72  hours No results found for this basename: TSH, T4TOTAL, FREET3, T3FREE, THYROIDAB,  in the last 72 hours No results found for this basename: VITAMINB12, FOLATE, FERRITIN, TIBC, IRON, RETICCTPCT,  in the last 72 hours  Medications: Scheduled . amiodarone  200 mg Oral Daily  . antiseptic oral rinse  15 mL Mouth Rinse BID  . aspirin EC  325 mg Oral Daily   Or  . aspirin  324 mg Per Tube Daily  . bisacodyl  10 mg Oral Daily   Or  . bisacodyl  10 mg Rectal Daily  . docusate sodium  200 mg Oral Daily  . enoxaparin (LOVENOX) injection  40 mg Subcutaneous Q24H  . feeding supplement (ENSURE COMPLETE)  237 mL Oral BID BM  . insulin aspart  0-9 Units Subcutaneous TID WC  . levothyroxine  50 mcg Oral QAC breakfast  . metoprolol tartrate  12.5 mg Oral BID   Or  . metoprolol tartrate  12.5 mg Per Tube BID  . pantoprazole  40 mg Oral Daily  . sertraline  50 mg Oral 3 times weekly  . sodium chloride  3 mL Intravenous Q12H     Radiology/Studies:  Dg Chest Port 1 View  09/03/2013   CLINICAL DATA:  CABG.  EXAM: PORTABLE CHEST - 1 VIEW  COMPARISON:  09/01/2013.  FINDINGS: Interim removal of right IJ line. Previously identified tiny left apical pneumothorax is no longer identified. Mediastinum and hilar structures are unremarkable. Mild cardiomegaly. Prior CABG. Mild pulmonary vascular prominence with bilateral pleural effusions. A component of congestive heart failure should be considered. Basilar atelectasis and or pneumonia cannot be excluded.  IMPRESSION: 1. Previously identified left apical pneumothorax is no longer identified. 2. Interim removal of right IJ tube. 3. Pulmonary interstitial prominence with bilateral pleural effusions. Component of CHF should be considered. Prior CABG.   Electronically Signed   By: Marcello Moores  Register   On: 09/03/2013 07:25    INR: Will add last result for INR, ABG once components are confirmed Will add last 4 CBG results once components are  confirmed  Assessment/Plan: S/P Procedure(s) (LRB): CORONARY ARTERY BYPASS GRAFTING (CABG) x2 using right greater saphenous vein and left internal mammary artery.  (N/A) INTRAOPERATIVE TRANSESOPHAGEAL ECHOCARDIOGRAM (N/A)  1 Conts to improve- cont rehab 2 wean O2 3 afib- will increase lopressor, cont amio, increase to BID 4 sugars controlled 5 TSH a little low- will reduce Synthroid   LOS: 7 days    GOLD,WAYNE E 11/11/20148:06 AM  I have seen and examined Maria [name] and agree with the above assessment  and plan.  Grace Isaac MD Beeper [phone] Office [phone] 09/04/2013 5:35 PM

## 2013-09-04 NOTE — Progress Notes (Signed)
Patient ambulated 161f using a rolling walker with RN.  Patient paused halfway during the walk to rest.  Patient had a slow, steady gait.  Patient's vital signs remained stable. Patient returned to bed resting. Will continue to monitor.

## 2013-09-04 NOTE — Progress Notes (Signed)
CARDIAC REHAB PHASE I   PRE:  Rate/Rhythm: 115 afib  BP:  Supine: 121/64      SaO2: 93%RA  MODE:  Ambulation: 250 ft   POST:  Rate/Rhythm: 120 afib   BP:  Supine:   Sitting:   Standing: did nit take BP. Too busy redressing old chest tube site   SaO2: 94%RA 0920-1000 Pt walked 250 ft on RA with asst x 2 and rolling walker. Could have gone a little farther but the old chest tube site was leaking through the dressing and leaving trail of serous liquid. To recliner after walk and re dressed left chest tube site. ABD pads were soaked. Gown and footies changed. Pt instructed several times not to use arms due to sternal precautions. She used them trying to stand and then holding self when she was sitting. She said it was her husband's fault because he told her she could use her arms. Explained to pt again that 5 to 8 pounds was all she could pick up at discharge and she was not to use arms getting up or sitting. Will need re enforcement by nursing staff. Call bell in reach. Did well on RA.  Graylon Good, RN BSN  09/04/2013 9:54 AM

## 2013-09-04 NOTE — Progress Notes (Signed)
Physical Therapy Treatment Patient Details Name: Maria [name] MRN: [id number] DOB: [birth year] Today's Date: 09/04/2013 Time: 0034-9179 PT Time Calculation (min): 27 min  PT Assessment / Plan / Recommendation  History of Present Illness Pt admit for CABG x2.   PT Comments   Pt making good progress with mobility. Did very well with rollator.  Follow Up Recommendations  Home health PT;Supervision/Assistance - 24 hour     Does the patient have the potential to tolerate intense rehabilitation     Barriers to Discharge        Equipment Recommendations  Other (comment) (rollator)    Recommendations for Other Services    Frequency Min 3X/week   Progress towards PT Goals Progress towards PT goals: Progressing toward goals  Plan Current plan remains appropriate    Precautions / Restrictions Precautions Precautions: Fall;Sternal   Pertinent Vitals/Pain VSS    Mobility  Transfers Sit to Stand: 4: Min assist;4: Min guard;From chair/3-in-1 Stand to Sit: 4: Min assist;To chair/3-in-1 Details for Transfer Assistance: Verbal cues for pt to place hands on knees and use rocking momentum to come to standing and adhere to sternal precautions.  Pt able to perform with min guard/min A for balance.  Min A to control descent for stand to sit. Performed sit to stand x 5 to practice. Ambulation/Gait Ambulation/Gait Assistance: 4: Min guard;4: Min assist Ambulation Distance (Feet): 350 Feet (1 sitting rest break on rollator.) Assistive device: 4-wheeled walker Ambulation/Gait Assistance Details: Initial verbal cues for use of rollator.  Pt able to [facility] rollator very well.  Required min A at times for unsteadiness. Verbal cues to stand more erect. Gait Pattern: Step-through pattern;Decreased stride length;Trunk flexed Gait velocity: much improved using rollator    Exercises     PT Diagnosis:    PT Problem List:   PT Treatment Interventions:     PT Goals (current goals can now be found  in the care plan section)    Visit Information  Last PT Received On: 09/04/13 Assistance Needed: +1 History of Present Illness: Pt admit for CABG x2.    Subjective Data      Cognition  Cognition Arousal/Alertness: Awake/alert Behavior During Therapy: WFL for tasks assessed/performed Overall Cognitive Status: Within Functional Limits for tasks assessed    Balance  Static Standing Balance Static Standing - Balance Support: Bilateral upper extremity supported Static Standing - Level of Assistance: 5: Stand by assistance  End of Session PT - End of Session Equipment Utilized During Treatment: Gait belt Activity Tolerance: Patient tolerated treatment well Patient left: in chair;with call bell/phone within reach;with family/visitor present   GP     Kyann Heydt 09/04/2013, 1:25 PM  [facility] PT [phone]

## 2013-09-04 NOTE — Progress Notes (Addendum)
       Patient Name: Maria [name] Date of Encounter: 09/04/2013    SUBJECTIVE: The patient feels well. She did not walk yesterday. She developed atrial fibrillation yesterday. She has been on amiodarone since admission. She had atrial fibrillation prior to cardiac surgery but have been under control on amiodarone.  TELEMETRY:  Atrial fibrillation with moderate rate control Filed Vitals:   09/03/13 1100 09/03/13 1353 09/03/13 2014 09/04/13 0500  BP: 134/64 132/67 151/75 130/73  Pulse: 96 90 95 97  Temp:  98.2 F (36.8 C) 98.6 F (37 C) 98.1 F (36.7 C)  TempSrc:  Oral Oral Oral  Resp:  18 19 18   Height:      Weight:    111 lb 15.9 oz (50.8 kg)  SpO2:  95% 95% 97%    Intake/Output Summary (Last 24 hours) at 09/04/13 0910 Last data filed at 09/04/13 0551  Gross per 24 hour  Intake    480 ml  Output      1 ml  Net    479 ml    LABS: Basic Metabolic Panel:  Recent Labs  09/02/13 0320 09/03/13 0703  NA 134* 133*  K 3.9 3.5  CL 101 101  CO2 25 25  GLUCOSE 98 88  BUN 24* 13  CREATININE 0.71 0.51  CALCIUM 8.6 8.4   CBC:  Recent Labs  09/02/13 0320 09/03/13 0703  WBC 7.6 6.4  HGB 9.9* 10.8*  HCT 29.8* 32.0*  MCV 94.6 93.0  PLT 186 247     Radiology/Studies:  No new data  Physical Exam: Blood pressure 130/73, pulse 97, temperature 98.1 F (36.7 C), temperature source Oral, resp. rate 18, height 5' 2.5" (1.588 m), weight 111 lb 15.9 oz (50.8 kg), SpO2 97.00%. Weight change: -3 lb 9.7 oz (-1.636 kg)   Irregularly irregular rhythm Flat neck veins  ASSESSMENT:  1. Atrial fibrillation, recurrent post coronary artery bypass grafting. His pre-existing history of atrial fibrillation. The patient is below chronic amiodarone therapy. 2. No clinical evidence of heart failure.  Plan:  1. Increase amiodarone to 400 mg daily 2. Rate control with low-dose beta blocker therapy. 3. Hopefully we will have spontaneous conversion 4. Will need  anticoagulation, when safe.   Demetrios Isaacs 09/04/2013, 9:10 AM

## 2013-09-05 ENCOUNTER — Inpatient Hospital Stay (HOSPITAL_COMMUNITY): Payer: Medicare Other

## 2013-09-05 DIAGNOSIS — I5020 Unspecified systolic (congestive) heart failure: Secondary | ICD-10-CM | POA: Diagnosis not present

## 2013-09-05 DIAGNOSIS — I4891 Unspecified atrial fibrillation: Secondary | ICD-10-CM | POA: Diagnosis not present

## 2013-09-05 DIAGNOSIS — E039 Hypothyroidism, unspecified: Secondary | ICD-10-CM | POA: Diagnosis not present

## 2013-09-05 DIAGNOSIS — M6250 Muscle wasting and atrophy, not elsewhere classified, unspecified site: Secondary | ICD-10-CM | POA: Diagnosis not present

## 2013-09-05 LAB — GLUCOSE, CAPILLARY
Glucose-Capillary: 90 mg/dL (ref 70–99)
Glucose-Capillary: 90 mg/dL (ref 70–99)
Glucose-Capillary: 122 mg/dL — ABNORMAL HIGH (ref 70–99)

## 2013-09-05 LAB — BASIC METABOLIC PANEL WITH GFR
BUN: 9 mg/dL (ref 6–23)
CO2: 23 meq/L (ref 19–32)
Calcium: 8.4 mg/dL (ref 8.4–10.5)
Chloride: 101 meq/L (ref 96–112)
Creatinine, Ser: 0.61 mg/dL (ref 0.50–1.10)
GFR calc Af Amer: >90 mL/min
GFR calc non Af Amer: 79 mL/min — ABNORMAL LOW
Glucose, Bld: 102 mg/dL — ABNORMAL HIGH (ref 70–99)
Potassium: 4 meq/L (ref 3.5–5.1)
Sodium: 134 meq/L — ABNORMAL LOW (ref 135–145)

## 2013-09-05 MED ORDER — POTASSIUM CHLORIDE CRYS ER 20 MEQ PO TBCR: 20.0000 meq | EXTENDED_RELEASE_TABLET | Freq: Every day | ORAL | Status: DC

## 2013-09-05 MED ORDER — FUROSEMIDE 40 MG PO TABS
40.0000 mg | ORAL_TABLET | Freq: Every day | ORAL | Status: DC
  Administered 2013-09-06 – 2013-09-08 (×3): 40 mg via ORAL
  Filled 2013-09-05 (×3): qty 1

## 2013-09-05 MED ORDER — FUROSEMIDE 40 MG PO TABS
40.0000 mg | ORAL_TABLET | Freq: Every day | ORAL | Status: DC
  Filled 2013-09-05: qty 1

## 2013-09-05 MED ORDER — POTASSIUM CHLORIDE CRYS ER 20 MEQ PO TBCR
20.0000 meq | EXTENDED_RELEASE_TABLET | Freq: Every day | ORAL | Status: DC
  Administered 2013-09-06 – 2013-09-08 (×3): 20 meq via ORAL
  Filled 2013-09-05 (×3): qty 1

## 2013-09-05 MED ORDER — METOPROLOL TARTRATE 25 MG PO TABS
25.0000 mg | ORAL_TABLET | Freq: Three times a day (TID) | ORAL | Status: DC
  Administered 2013-09-05 – 2013-09-07 (×5): 25 mg via ORAL
  Filled 2013-09-05 (×7): qty 1

## 2013-09-05 NOTE — Progress Notes (Addendum)
[address]       [place] [address]             [phone]      8 Days Post-Op  Procedure(s) (LRB): CORONARY ARTERY BYPASS GRAFTING (CABG) x2 using right greater saphenous vein and left internal mammary artery.  (N/A) INTRAOPERATIVE TRANSESOPHAGEAL ECHOCARDIOGRAM (N/A) Subjective: Feels fair, didn't sleep well  Objective  Telemetry afib with better rate control  Temp:  [98 F (36.7 C)-98.5 F (36.9 C)] 98.3 F (36.8 C) (11/12 0352) Pulse Rate:  [91-101] 101 (11/12 0352) Resp:  [18-19] 19 (11/12 0352) BP: (107-115)/(70-78) 115/78 mmHg (11/12 0352) SpO2:  [92 %-100 %] 92 % (11/12 0352) Weight:  [121 lb 6.4 oz (55.067 kg)] 121 lb 6.4 oz (55.067 kg) (11/12 0352)   Intake/Output Summary (Last 24 hours) at 09/05/13 0751 Last data filed at 09/05/13 0401  Gross per 24 hour  Intake    240 ml  Output      1 ml  Net    239 ml       General appearance: alert, cooperative and no distress Heart: irregularly irregular rhythm Lungs: dim right>left base Abdomen: benign Extremities: no edema Wound: incisions healing well  Lab Results:  Recent Labs  09/03/13 0703 09/05/13 0407  NA 133* 134*  K 3.5 4.0  CL 101 101  CO2 25 23  GLUCOSE 88 102*  BUN 13 9  CREATININE 0.51 0.61  CALCIUM 8.4 8.4   No results found for this basename: AST, ALT, ALKPHOS, BILITOT, PROT, ALBUMIN,  in the last 72 hours No results found for this basename: LIPASE, AMYLASE,  in the last 72 hours  Recent Labs  09/03/13 0703  WBC 6.4  HGB 10.8*  HCT 32.0*  MCV 93.0  PLT 247   No results found for this basename: CKTOTAL, CKMB, TROPONINI,  in the last 72 hours No components found with this basename: POCBNP,  No results found for this basename: DDIMER,  in the last 72 hours No results found for this basename: HGBA1C,  in the last 72 hours No results found for this basename: CHOL, HDL, LDLCALC, TRIG, CHOLHDL,  in the last 72 hours No results found for this basename:  TSH, T4TOTAL, FREET3, T3FREE, THYROIDAB,  in the last 72 hours No results found for this basename: VITAMINB12, FOLATE, FERRITIN, TIBC, IRON, RETICCTPCT,  in the last 72 hours  Medications: Scheduled . amiodarone  200 mg Oral BID  . antiseptic oral rinse  15 mL Mouth Rinse BID  . aspirin EC  325 mg Oral Daily   Or  . aspirin  324 mg Per Tube Daily  . enoxaparin (LOVENOX) injection  40 mg Subcutaneous Q24H  . feeding supplement (ENSURE COMPLETE)  237 mL Oral BID BM  . insulin aspart  0-9 Units Subcutaneous TID WC  . levothyroxine  25 mcg Oral QAC breakfast  . metoprolol tartrate  25 mg Oral BID  . pantoprazole  40 mg Oral Daily  . sertraline  50 mg Oral 3 times weekly  . sodium chloride  3 mL Intravenous Q12H     Radiology/Studies:  No results found.  INR: Will add last result for INR, ABG once components are confirmed Will add last 4 CBG results once components are confirmed  Assessment/Plan: S/P Procedure(s) (LRB): CORONARY ARTERY BYPASS GRAFTING (CABG) x2 using right greater saphenous vein and left internal mammary artery.  (N/A) INTRAOPERATIVE TRANSESOPHAGEAL ECHOCARDIOGRAM (N/A)  1 afib- rate better controlled. ? Timing  of ACRX. Currently on lovenox/asa. K+ ok 2 now off O2 , right pleural effus may be enlarged. Repeat CXR- may need tap 3 sugars good control     LOS: 8 days    [name],Maria E 11/12/20147:51 AM  Chest xray improved still with some rt effusion. Resume lasix Agree with Dr [name], start coumadin tomorrow if does not convert  I have seen and examined Maria [name] and agree with the above assessment  and plan.  Grace Isaac MD Beeper [phone] Office [phone] 09/05/2013 5:50 PM

## 2013-09-05 NOTE — Progress Notes (Signed)
CARDIAC REHAB PHASE I   PRE:  Rate/Rhythm: 103 afib    BP: sitting 126/78    SaO2: 94 RA  MODE:  Ambulation: 350 ft   POST:  Rate/Rhythm: 120 afib    BP: sitting 134/78     SaO2: 95 RA  Pt more alert today and stronger. Able to support herself with core strength. Steady with rollator (she prefers this), assist x2. Rest x2 due to SOB. To recliner after walk. Planning SNF. Can be x1 next walk.  [id number]   Josephina Shih [place] CES, ACSM 09/05/2013 8:19 AM

## 2013-09-05 NOTE — Progress Notes (Signed)
Pt concerned with dietician being able to get meals prepared as she gets them at assisted living facility.  I spoke with her in detail.  I explained limitations here at hospital.  She will get her husband to bring her soup tomorrow and any other special needs she may have.  I have explained that if she wants something that is not offered to her by the ambassador she may speak to the RN.  Will continue to monitor. Payton Emerald

## 2013-09-05 NOTE — Progress Notes (Addendum)
Pt ambulated 500 ft with front wheel walker.  Pt paused once due to SOB. VSS.  Pt to recliner after walk.  Will continue to monitor. Call bell in reach.

## 2013-09-05 NOTE — Progress Notes (Signed)
       Patient Name: Maria [name] Date of Encounter: 09/05/2013    SUBJECTIVE: Fatigue with activity. She feels it is improving day to day. She denies orthopnea. Appetite is slowly improving.  TELEMETRY:  Atrial fibrillation with only moderate rate control Filed Vitals:   09/05/13 0352 09/05/13 0909 09/05/13 1317 09/05/13 1340  BP: 115/78  101/71   Pulse: 101 90 101 98  Temp: 98.3 F (36.8 C)  98.3 F (36.8 C)   TempSrc: Oral  Oral   Resp: 19 18 18 30   Height:      Weight: 121 lb 6.4 oz (55.067 kg)     SpO2: 92%  97%     Intake/Output Summary (Last 24 hours) at 09/05/13 1730 Last data filed at 09/05/13 1230  Gross per 24 hour  Intake    720 ml  Output      1 ml  Net    719 ml    LABS: Basic Metabolic Panel:  Recent Labs  09/03/13 0703 09/05/13 0407  NA 133* 134*  K 3.5 4.0  CL 101 101  CO2 25 23  GLUCOSE 88 102*  BUN 13 9  CREATININE 0.51 0.61  CALCIUM 8.4 8.4   CBC:  Recent Labs  09/03/13 0703  WBC 6.4  HGB 10.8*  HCT 32.0*  MCV 93.0  PLT 247   BNP    Component Value Date/Time   PROBNP 3947.0* 09/05/2013 0810     Radiology/Studies:  New data  Physical Exam: Blood pressure 101/71, pulse 98, temperature 98.3 F (36.8 C), temperature source Oral, resp. rate 30, height 5' 2.5" (1.588 m), weight 121 lb 6.4 oz (55.067 kg), SpO2 97.00%. Weight change: 9 lb 6.5 oz (4.267 kg)   1 of 6 apical systolic murmur Irregularly irregular rhythm Chest is clear the bases without rales No significant JVD  ASSESSMENT:  1. Atrial fibrillation with moderate rate control, aggravating postoperative recovery 2. Coronary atherosclerosis status post bypass surgery, no evidence of recurrent ischemia 3. Diastolic heart failure, acute on chronic 4. Elderly and frail  Plan:  Continue the higher dose of amiodarone. Hoping for pharmacologic conversion. We will need to start anticoagulation therapy in the morning if she remains in atrial fibrillation. Will  talk with Dr. Servando Snare about this. May eventually need cardioversion if she doesn't spontaneously convert. Probably needs more aggressive diuresis as well.  Demetrios Isaacs 09/05/2013, 5:30 PM

## 2013-09-05 NOTE — Progress Notes (Signed)
Nutrition Brief Note  RD consulted per RN "patient has celiac disease and would like consult for what she has available in hospital". Discussed current diet order and limitations.  Encouraged patient to work with [employer] for meal ordering.  Patient requested "head dietitian;" informed her that there is no one here at the hospital under that title.  Contacted Patient Risk analyst per patient's request via telephone; left message.  Patient and husband frustrated with current limitations; she ordered dinner tonight.  RN to obtain condiment (ie ketchup).  RD available for ongoing assistance as needed.  Arthur Holms, RD, LDN Pager #: [phone] After-Hours Pager #: [phone]

## 2013-09-06 LAB — CBC
HCT: 30.6 % — ABNORMAL LOW (ref 36.0–46.0)
Hemoglobin: 10.5 g/dL — ABNORMAL LOW (ref 12.0–15.0)
MCH: 32.1 pg (ref 26.0–34.0)
MCHC: 34.3 g/dL (ref 30.0–36.0)
MCV: 93.6 fL (ref 78.0–100.0)
Platelets: 406 10*3/uL — ABNORMAL HIGH (ref 150–400)
RBC: 3.27 MIL/uL — ABNORMAL LOW (ref 3.87–5.11)
RDW: 15.6 % — ABNORMAL HIGH (ref 11.5–15.5)
WBC: 7.4 10*3/uL (ref 4.0–10.5)

## 2013-09-06 LAB — BASIC METABOLIC PANEL
BUN: 11 mg/dL (ref 6–23)
CO2: 23 mEq/L (ref 19–32)
Calcium: 8.5 mg/dL (ref 8.4–10.5)
Chloride: 100 mEq/L (ref 96–112)
Creatinine, Ser: 0.69 mg/dL (ref 0.50–1.10)
GFR calc Af Amer: 88 mL/min — ABNORMAL LOW (ref >90)
GFR calc non Af Amer: 76 mL/min — ABNORMAL LOW (ref >90)
Glucose, Bld: 103 mg/dL — ABNORMAL HIGH (ref 70–99)
Potassium: 4.3 mEq/L (ref 3.5–5.1)
Sodium: 133 mEq/L — ABNORMAL LOW (ref 135–145)

## 2013-09-06 LAB — PROTIME-INR
INR: 1.24 (ref 0.00–1.49)
Prothrombin Time: 15.3 seconds — ABNORMAL HIGH (ref 11.6–15.2)

## 2013-09-06 NOTE — Care Management Note (Signed)
    Page 1 of 1   09/07/2013     5:03:25 PM   CARE MANAGEMENT NOTE 09/07/2013  Patient:  Maria [name], Maria [name]   Account Number:  [id number]  Date Initiated:  08/31/2013  Documentation initiated by:  MAYO,HENRIETTA  Subjective/Objective Assessment:   adm s/p CABG, lives with spouse    PCP Dr [name]     Action/Plan:   Anticipated DC Date:  09/08/2013   Anticipated DC Plan:  [place] referral  Clinical Social Worker      DC Planning Services  CM consult      Choice offered to / List presented to:             Status of service:  Completed, signed off Medicare Important Message given?   (If response is "NO", the following Medicare IM given date fields will be blank) Date Medicare IM given:   Date Additional Medicare IM given:    Discharge Disposition:  [place]  Per UR Regulation:  Reviewed for med. necessity/level of care/duration of stay  If discussed at [place] of Stay Meetings, dates discussed:   09/06/2013    Comments:  09/07/13 Hadlea Furuya,RN,BSN [phone] PT FOR DC TO [facility] ON SAT 11/15, PER CSW ARRANGEMENTS.  09/05/13 Jamieon Lannen,RN,BSN [phone] INFORMED BY PT THAT SHE PLANS TO GO TO [facility] AT DC; PT AND SPOUSE CURRENTLY LIVE AT [place]...WILL CONSULT CSW TO HELP FACILITATE THIS.

## 2013-09-06 NOTE — Progress Notes (Addendum)
       Patient Name: Maria [name] Date of Encounter: 09/06/2013    SUBJECTIVE: Cough. Dyspnea on exertion.  TELEMETRY:   NSR !! Filed Vitals:   09/05/13 1340 09/05/13 2035 09/06/13 0259 09/06/13 0532  BP:  120/73  137/69  Pulse: 98 109  68  Temp:  98.9 F (37.2 C)  98 F (36.7 C)  TempSrc:  Oral  Oral  Resp: 30 18  18   Height:      Weight:   121 lb 8 oz (55.112 kg)   SpO2:  93%  90%    Intake/Output Summary (Last 24 hours) at 09/06/13 0844 Last data filed at 09/06/13 0700  Gross per 24 hour  Intake    600 ml  Output      0 ml  Net    600 ml    LABS: Basic Metabolic Panel:  Recent Labs  09/05/13 0407 09/06/13 0536  NA 134* 133*  K 4.0 4.3  CL 101 100  CO2 23 23  GLUCOSE 102* 103*  BUN 9 11  CREATININE 0.61 0.69  CALCIUM 8.4 8.5   CBC:  Recent Labs  09/06/13 0536  WBC 7.4  HGB 10.5*  HCT 30.6*  MCV 93.6  PLT 406*   BNP (last 3 results)  Recent Labs  09/05/13 0810  PROBNP 3947.0*    Radiology/Studies:  N/A  Physical Exam: Blood pressure 137/69, pulse 68, temperature 98 F (36.7 C), temperature source Oral, resp. rate 18, height 5' 2.5" (1.588 m), weight 121 lb 8 oz (55.112 kg), SpO2 90.00%. Weight change: 1.6 oz (0.045 kg)   Decreased breath sounds. Regular rhythm. 7-4/7 systolic murmur MR  ASSESSMENT:  1. Atrial fib has converted to NSR 2. Acute on chronic DHF with dyspnea.  Plan:  1. Lasix.  Demetrios Isaacs 09/06/2013, 8:44 AM

## 2013-09-06 NOTE — Progress Notes (Signed)
Physical Therapy Treatment Patient Details Name: Maria [name] MRN: [id number] DOB: 1924-07-11 Today's Date: 09/06/2013 Time: 9458-5929 PT Time Calculation (min): 18 min  PT Assessment / Plan / Recommendation  History of Present Illness Pt admit for CABG x2.   PT Comments   Pt limited gait distance today due to fatigue, able to perform transfers with min A, cues for sternal precautions.  Pt will continue to benefit from skilled PT and will benefit from 24 hour assistance at d/c.  Follow Up Recommendations  Home health PT;Supervision/Assistance - 24 hour     Does the patient have the potential to tolerate intense rehabilitation     Barriers to Discharge        Equipment Recommendations   (rollator)    Recommendations for Other Services    Frequency Min 3X/week   Progress towards PT Goals Progress towards PT goals: Progressing toward goals  Plan Current plan remains appropriate    Precautions / Restrictions Precautions Precautions: Fall;Sternal Restrictions Weight Bearing Restrictions: No   Pertinent Vitals/Pain No c/o pain    Mobility  Bed Mobility Bed Mobility: Supine to Sit Supine to Sit: 4: Min assist Sit to Supine: 4: Min assist Details for Bed Mobility Assistance: Cues for sternal precautions.  Transfers Sit to Stand: 4: Min guard;4: Min assist;From bed Stand to Sit: 4: Min guard;To bed Details for Transfer Assistance: cues for sternal precautions, steadying assist for balance Ambulation/Gait Ambulation/Gait Assistance: 5: Supervision Ambulation Distance (Feet): 200 Feet Assistive device: Rolling walker Ambulation/Gait Assistance Details: pt with good use of RW, decreased gait distance today due to fatigue from just walking to/from bathroom    Exercises     PT Diagnosis:    PT Problem List:   PT Treatment Interventions:     PT Goals (current goals can now be found in the care plan section)    Visit Information  Last PT Received On:  09/06/13 Assistance Needed: +1 History of Present Illness: Pt admit for CABG x2.    Subjective Data      Cognition  Cognition Arousal/Alertness: Awake/alert Behavior During Therapy: WFL for tasks assessed/performed Overall Cognitive Status: Within Functional Limits for tasks assessed    Balance     End of Session PT - End of Session Equipment Utilized During Treatment: Gait belt Activity Tolerance: Patient tolerated treatment well Patient left: in bed;with call bell/phone within reach;with family/visitor present Nurse Communication: Mobility status   GP     Dang Mathison 09/06/2013, 1:50 PM

## 2013-09-06 NOTE — Progress Notes (Addendum)
[address]       [place] [address]             [phone]   9 Days Post-Op  Procedure(s) (LRB): CORONARY ARTERY BYPASS GRAFTING (CABG) x2 using right greater saphenous vein and left internal mammary artery.  (N/A) INTRAOPERATIVE TRANSESOPHAGEAL ECHOCARDIOGRAM (N/A) Subjective: Feels ok, not satisfied with diet choices. Now in SR  Objective  Telemetry sinus rhythm  Temp:  [98 F (36.7 C)-98.9 F (37.2 C)] 98 F (36.7 C) (11/13 0532) Pulse Rate:  [68-109] 68 (11/13 0532) Resp:  [18-30] 18 (11/13 0532) BP: (101-137)/(69-73) 137/69 mmHg (11/13 0532) SpO2:  [90 %-97 %] 90 % (11/13 0532) Weight:  [121 lb 8 oz (55.112 kg)] 121 lb 8 oz (55.112 kg) (11/13 0259)   Intake/Output Summary (Last 24 hours) at 09/06/13 0737 Last data filed at 09/05/13 1230  Gross per 24 hour  Intake    360 ml  Output      0 ml  Net    360 ml       General appearance: alert, cooperative and no distress Heart: regular rate and rhythm Lungs: dim in right base> left base Abdomen: benign Extremities: no edema Wound: incisions healing well  Lab Results:  Recent Labs  09/05/13 0407 09/06/13 0536  NA 134* 133*  K 4.0 4.3  CL 101 100  CO2 23 23  GLUCOSE 102* 103*  BUN 9 11  CREATININE 0.61 0.69  CALCIUM 8.4 8.5   No results found for this basename: AST, ALT, ALKPHOS, BILITOT, PROT, ALBUMIN,  in the last 72 hours No results found for this basename: LIPASE, AMYLASE,  in the last 72 hours  Recent Labs  09/06/13 0536  WBC 7.4  HGB 10.5*  HCT 30.6*  MCV 93.6  PLT 406*   No results found for this basename: CKTOTAL, CKMB, TROPONINI,  in the last 72 hours No components found with this basename: POCBNP,  No results found for this basename: DDIMER,  in the last 72 hours No results found for this basename: HGBA1C,  in the last 72 hours No results found for this basename: CHOL, HDL, LDLCALC, TRIG, CHOLHDL,  in the last 72 hours No results found for this basename:  TSH, T4TOTAL, FREET3, T3FREE, THYROIDAB,  in the last 72 hours No results found for this basename: VITAMINB12, FOLATE, FERRITIN, TIBC, IRON, RETICCTPCT,  in the last 72 hours  Medications: Scheduled . amiodarone  200 mg Oral BID  . antiseptic oral rinse  15 mL Mouth Rinse BID  . aspirin EC  325 mg Oral Daily   Or  . aspirin  324 mg Per Tube Daily  . enoxaparin (LOVENOX) injection  40 mg Subcutaneous Q24H  . feeding supplement (ENSURE COMPLETE)  237 mL Oral BID BM  . furosemide  40 mg Oral Daily  . levothyroxine  25 mcg Oral QAC breakfast  . metoprolol tartrate  25 mg Oral TID  . pantoprazole  40 mg Oral Daily  . potassium chloride  20 mEq Oral Daily  . sertraline  50 mg Oral 3 times weekly  . sodium chloride  3 mL Intravenous Q12H     Radiology/Studies:  Dg Chest 2 View  09/05/2013   CLINICAL DATA:  Dyspnea with history of atrial fibrillation.  EXAM: CHEST  2 VIEW  COMPARISON:  September 03, 2013.  FINDINGS: The lungs are well expanded. No left apical pneumothorax is demonstrated. There is persistent blunting of the costophrenic angles consistent  with pleural effusions, but this has slightly improved since the earlier study. The cardiac silhouette remains mildly enlarged. A left atrial appendage clip is present and unchanged. The patient has undergone previous median sternotomy and CABG. The central pulmonary vascularity is prominent. The pulmonary interstitial markings are also mildly prominent especially in the mid and lower lungs.  IMPRESSION: There has been slight interval improvement in the appearance of the chest especially of the bilateral pleural effusions. Improved aeration of both lungs has occurred. There is no evidence of a recurrent pneumothorax.   Electronically Signed   By: David  Martinique   On: 09/05/2013 09:46    INR: Will add last result for INR, ABG once components are confirmed Will add last 4 CBG results once components are confirmed  Assessment/Plan: S/P  Procedure(s) (LRB): CORONARY ARTERY BYPASS GRAFTING (CABG) x2 using right greater saphenous vein and left internal mammary artery.  (N/A) INTRAOPERATIVE TRANSESOPHAGEAL ECHOCARDIOGRAM (N/A)  1 doing well 2 CXR improved, getting diuresed further 3 now in sinus rhythm- trying to avoid coumadin 4 push nutrition/rehab as able       LOS: 9 days    Maria [name],Maria [name] 11/13/20147:37 AM  Poss rehab tomorrow  I have seen and examined Maria [name] and agree with the above assessment  and plan.  Grace Isaac MD Beeper [phone] Office [phone] 09/06/2013 5:38 PM

## 2013-09-06 NOTE — Progress Notes (Signed)
CARDIAC REHAB PHASE I   PRE:  Rate/Rhythm: 67SR  BP:  Supine:   Sitting: 127/74  Standing:    SaO2: 93%RA  MODE:  Ambulation: 550 ft   POST:  Rate/Rhythm: 78SR  BP:  Supine:   Sitting: 142/50  Standing:    SaO2: 93%RA 0745-0820 Pt walked 550 ft with rollator and asst x 1. Tolerated well. Stopped several times to rest due to SOB. Stated hard to complete sentences but able to carry on a conversation. To recliner after walk. Heated up water for tea. Remained in NSR.    Graylon Good, RN BSN  09/06/2013 8:17 AM

## 2013-09-06 NOTE — Progress Notes (Signed)
Clinical Social Work Department BRIEF PSYCHOSOCIAL ASSESSMENT 09/06/2013  Patient:  Maria [name], Maria [name]     Account Number:  [id number]     Admit date:  08/28/2013  Clinical Social Worker:  Megan Salon  Date/Time:  09/06/2013 11:21 AM  Referred by:  Care Management  Date Referred:  09/05/2013 Referred for  SNF Placement   Other Referral:   Interview type:  Other - See comment Other interview type:   CSW spoke to patient and husband by bedside    PSYCHOSOCIAL DATA Living Status:  HUSBAND Admitted from facility:  [facility] Level of care:  Independent Living Primary support name:  Maria [name] Primary support relationship to patient:  SPOUSE Degree of support available:   Good    CURRENT CONCERNS Current Concerns  Post-Acute Placement   Other Concerns:    SOCIAL WORK ASSESSMENT / PLAN Clinical Social Worker received referral for SNF placement at d/c. CSW introduced self and explained reason for visit. Patient had visitor by bedside, husband. Patient and husband explained that they live in [place] at the Independent side and are wanting patient to transition into the short term rehab side short term. Husband explained that he is going there today to confirm a bed. Patient reported she is agreeable for SNF placement. CSW left a voicemail with the admissions coordinator at [facility] and is awaiting a phone call. CSW will complete FL2 for MD's signature.   Assessment/plan status:  Psychosocial Support/Ongoing Assessment of Needs Other assessment/ plan:   Information/referral to community resources:   CSW contact information/SNF information    PATIENT'S/FAMILY'S RESPONSE TO PLAN OF CARE: Patient states she is agreeable to SNF and wants [facility].       Maria [name], MSW, [place]

## 2013-09-07 MED ORDER — AMIODARONE HCL 200 MG PO TABS
200.0000 mg | ORAL_TABLET | Freq: Two times a day (BID) | ORAL | Status: AC
  Administered 2013-09-07: 200 mg via ORAL
  Filled 2013-09-07 (×2): qty 1

## 2013-09-07 MED ORDER — AMIODARONE HCL 200 MG PO TABS
200.0000 mg | ORAL_TABLET | Freq: Every day | ORAL | Status: DC
  Administered 2013-09-08: 200 mg via ORAL
  Filled 2013-09-07: qty 1

## 2013-09-07 MED ORDER — METOPROLOL TARTRATE 50 MG PO TABS
50.0000 mg | ORAL_TABLET | Freq: Two times a day (BID) | ORAL | Status: DC
  Administered 2013-09-07: 50 mg via ORAL
  Filled 2013-09-07 (×4): qty 1

## 2013-09-07 NOTE — Progress Notes (Signed)
CARDIAC REHAB PHASE I   PRE:  Rate/Rhythm: 62 SR  BP:  Supine:   Sitting: 112/60  Standing:    SaO2: 98 RA  MODE:  Ambulation: 550 ft   POST:  Rate/Rhythm: 80 SR  BP:  Supine:   Sitting: 122/60  Standing:    SaO2: 94% RA  Tolerated fairly well with assistance x1 and rolling walker.  Stopped at halfway point to sit and rest, Sao2 decreased to 88% on RA, experienced shortness of breath,  practiced purse-lip breathing and saturation increased to 95%.  Education completed re: sternal precautions, signs and symptoms of infection, progression of exercise, and diet.  Not sure about phase II cardiac rehab, will call after home in 2-4 weeks. Frederick RN, BSN 09/07/2013 3:20 PM

## 2013-09-07 NOTE — Progress Notes (Addendum)
[address]       [place] [address]             [phone]      10 Days Post-Op  Procedure(s) (LRB): CORONARY ARTERY BYPASS GRAFTING (CABG) x2 using right greater saphenous vein and left internal mammary artery.  (N/A) INTRAOPERATIVE TRANSESOPHAGEAL ECHOCARDIOGRAM (N/A) Subjective: Leaking some pleural fluid from CT site  Objective  Telemetry sinus rhythm  Temp:  [97.2 F (36.2 C)-98 F (36.7 C)] 98 F (36.7 C) (11/14 0610) Pulse Rate:  [61-65] 65 (11/14 0610) Resp:  [16-18] 18 (11/14 0610) BP: (108-145)/(50-68) 145/61 mmHg (11/14 0610) SpO2:  [93 %-98 %] 98 % (11/14 0610) Weight:  [121 lb 4.1 oz (55 kg)] 121 lb 4.1 oz (55 kg) (11/14 0610)   Intake/Output Summary (Last 24 hours) at 09/07/13 0741 Last data filed at 09/06/13 1700  Gross per 24 hour  Intake    243 ml  Output      0 ml  Net    243 ml       General appearance: alert, cooperative and no distress Heart: regular rate and rhythm Lungs: dim in bases Abdomen: benign Extremities: no LE edema Wound: incis healing well  Lab Results:  Recent Labs  09/05/13 0407 09/06/13 0536  NA 134* 133*  K 4.0 4.3  CL 101 100  CO2 23 23  GLUCOSE 102* 103*  BUN 9 11  CREATININE 0.61 0.69  CALCIUM 8.4 8.5   No results found for this basename: AST, ALT, ALKPHOS, BILITOT, PROT, ALBUMIN,  in the last 72 hours No results found for this basename: LIPASE, AMYLASE,  in the last 72 hours  Recent Labs  09/06/13 0536  WBC 7.4  HGB 10.5*  HCT 30.6*  MCV 93.6  PLT 406*   No results found for this basename: CKTOTAL, CKMB, TROPONINI,  in the last 72 hours No components found with this basename: POCBNP,  No results found for this basename: DDIMER,  in the last 72 hours No results found for this basename: HGBA1C,  in the last 72 hours No results found for this basename: CHOL, HDL, LDLCALC, TRIG, CHOLHDL,  in the last 72 hours No results found for this basename: TSH, T4TOTAL, FREET3, T3FREE,  THYROIDAB,  in the last 72 hours No results found for this basename: VITAMINB12, FOLATE, FERRITIN, TIBC, IRON, RETICCTPCT,  in the last 72 hours  Medications: Scheduled . amiodarone  200 mg Oral BID  . antiseptic oral rinse  15 mL Mouth Rinse BID  . aspirin EC  325 mg Oral Daily   Or  . aspirin  324 mg Per Tube Daily  . enoxaparin (LOVENOX) injection  40 mg Subcutaneous Q24H  . feeding supplement (ENSURE COMPLETE)  237 mL Oral BID BM  . furosemide  40 mg Oral Daily  . levothyroxine  25 mcg Oral QAC breakfast  . metoprolol tartrate  25 mg Oral TID  . pantoprazole  40 mg Oral Daily  . potassium chloride  20 mEq Oral Daily  . sertraline  50 mg Oral 3 times weekly  . sodium chloride  3 mL Intravenous Q12H     Radiology/Studies:  Dg Chest 2 View  09/05/2013   CLINICAL DATA:  Dyspnea with history of atrial fibrillation.  EXAM: CHEST  2 VIEW  COMPARISON:  September 03, 2013.  FINDINGS: The lungs are well expanded. No left apical pneumothorax is demonstrated. There is persistent blunting of the costophrenic angles consistent with pleural  effusions, but this has slightly improved since the earlier study. The cardiac silhouette remains mildly enlarged. A left atrial appendage clip is present and unchanged. The patient has undergone previous median sternotomy and CABG. The central pulmonary vascularity is prominent. The pulmonary interstitial markings are also mildly prominent especially in the mid and lower lungs.  IMPRESSION: There has been slight interval improvement in the appearance of the chest especially of the bilateral pleural effusions. Improved aeration of both lungs has occurred. There is no evidence of a recurrent pneumothorax.   Electronically Signed   By: David  Martinique   On: 09/05/2013 09:46    INR: Will add last result for INR, ABG once components are confirmed Will add last 4 CBG results once components are confirmed  Assessment/Plan: S/P Procedure(s) (LRB): CORONARY ARTERY  BYPASS GRAFTING (CABG) x2 using right greater saphenous vein and left internal mammary artery.  (N/A) INTRAOPERATIVE TRANSESOPHAGEAL ECHOCARDIOGRAM (N/A)  1 d/c pacer wires 2 maint SR- cont current rx 3 poss SNF in am       LOS: 10 days    Maria [name],Maria [name] 11/14/20147:41 AM  Plan transfer to SNF tomorrow, if stays in sinus I have seen and examined Maria [name] and agree with the above assessment  and plan.  Grace Isaac MD [facility] [phone] Office [phone] 09/07/2013 2:52 PM

## 2013-09-07 NOTE — Progress Notes (Signed)
RN has changed pt dressing to leaking chest tube site 2 additional times since original dressing at 1am.  Both dressing were 4x4's and ABD, and  were completely saturated with serosanguinous drainage.  Pt resting in bed with call light in reach.   Nolon Nations, RN

## 2013-09-07 NOTE — Progress Notes (Signed)
CARDIAC REHAB PHASE I   PRE:  Rate/Rhythm: 60 SR    BP: sitting 120/26    SaO2: 95 RA  MODE:  Ambulation: 40 ft   Attempted to walk however CT site began draining in hall. Had to sit and return back to room. Will f/u. RN with pt. [phone]  Josephina Shih Mercer CES, ACSM 09/07/2013 11:18 AM

## 2013-09-07 NOTE — Progress Notes (Signed)
Spoke pt about drainage from dressing.  Applied 4x4 drain sponge under left breast CT suture. Covered with ABD dressing and explained that we would re-check and possibly change when she received her bath later.  Pt understanding and will continue to monitor.  Pt resting with call bell within reach.Payton Emerald

## 2013-09-07 NOTE — Progress Notes (Signed)
Pt. C/o of "feeling wet", on assessment realized pt. Chest tube sites were draining through gown and onto bed .  Pt. Stood up to change and drainage continued to run down her leg on floor. Dressing was applied using 4x4 and ABD with paper tape for pacing wires. Pt. Resting in bed, call light in reach, RN will continue to monitor.   Nolon Nations, RN

## 2013-09-07 NOTE — Progress Notes (Signed)
PT Cancellation Note  Patient Details Name: Maria [name] MRN: [id number] DOB: 05-14-1924   Cancelled Treatment:    Reason Eval/Treat Not Completed: Other (comment) (Just walked with cardiac rehab)   [facility] 09/07/2013, 3:40 PM

## 2013-09-07 NOTE — Discharge Summary (Signed)
[name]       [name] [address]             [phone]              Discharge Summary  Name: Maria [name] DOB: Oct 11, 1924 79 y.o. MRN: [id number]   Admission Date: 08/28/2013 Discharge Date:     Admitting Diagnosis: Chest pain  Shortness of breath   Discharge Diagnosis:  Severe 2 vessel coronary artery disease Unstable angina Paroxysmal atrial fibrillation Moderate aortic insufficiency Moderate mitral insufficiency Acute on chronic diastolic heart failure Expected postoperative blood loss anemia  Past Medical History  Diagnosis Date  . Osteoporosis   . Celiac disease   . Celiac disease   . Carotid artery occlusion     right carotid stenosis 40-60%, 4/08, neg for stenosis repeat study 11/11  . Depression   . Presbycusis   . Macular degeneration   . Urethral stricture   . Moderate aortic insufficiency   . Moderate mitral regurgitation by prior echocardiogram     echo 9/13   . Thyroid nodule     63m, no change 11/11  . RLS (restless legs syndrome)   . Vitamin D deficiency   . Heart murmur   . Pulmonary hypertension   . Hypertension     takes Metoprolol daily  . Atrial fibrillation   . Coronary artery disease   . CHF (congestive heart failure)     takes Lasix daily  . MVP (mitral valve prolapse)   . Moderate obstructive sleep apnea     uses CPAP;sleep study about 4-56monthago  . Hearing loss     both ears  . Numbness     left arm  . Joint swelling   . GERD (gastroesophageal reflux disease)     takes Omeprazole daily  . Gastric ulcer     6-61m61monthgo  . GI bleeding   . Constipation   . Diarrhea   . Hemorrhoids   . Urinary retention   . Urinary anastomotic stricture   . Hypothyroidism     takes Synthroid daily as result of AMiodarone  . Hot flashes     takes ZOloft 3 times a week  . Insomnia     takes Melatonin nightly  . Restless leg   . Peripheral edema     left      Procedures:  CORONARY  ARTERY BYPASS GRAFTING x 2 (Left internal mammary artery to left anterior descending, saphenous vein to circumflex)  ENDOSCOPIC VEIN HARVEST RIGHT LEG  PLACEMENT OF LEFT ATRIAL CLIP -08/28/2013    HPI:  The patient is a 89 27o. female with a history of progressive exertional fatigue and dyspnea for the past 12 months.  She developed paroxysmal atrial fibrillation approximately 15 months ago, and has had 3 distinct episodes. She was loaded with amiodarone and her atrial fibrillation has come under good control. She developed hypothyroidism secondary to amiodarone. Despite control of atrial fib, her exertional fatigue and dyspnea have worsened and she has developed lower extremity edema. A 2D echocardiogram demonstrated moderate to severe mitral regurgitation with significant left atrial enlargement and moderate to severe pulmonary hypertension ( 55 mmHg by echo). With the addition of Lasix, her lower extremity edema has improved. She still has orthopnea. She has no exertional tolerance according to her husband. She also recently experienced some vague chest discomfort that is nonexertional. The discomfort has been mild and graded to be less than 2/10 in  intensity. She now notes that she has been able to walk 100 yards to the dining hall at her current living situation. She was seen by Dr. [name], and cardiac catheterization was performed, which revealed severe 2 vessel coronary artery disease involving the LAD and circumflex.  She was referred to Dr. Servando Snare for cardiac surgery evaluation.  It was felt that she would benefit from CABG with possible mitral repair or replacement. All risks, benefits and alternatives of surgery were explained in detail, and the patient agreed to proceed.     Hospital Course:  The patient was admitted to [facility] on 08/28/2013. The patient was taken to the operating room and underwent the above procedure.  Intraoperative TEE showed only mild mitral regurgitation and mild  aortic insufficiency with no structural abnormalities.  Therefore, no valve interventions were felt to be indicated at this time.  The postoperative course was notable for some early sedation, which delayed extubation until late in the day postop day 1.  Unfortunately, she remained somnolent after extubation and ABGs worsened.  She was placed on BiPap and Critical Care was consulted.  She ultimately required reintubation later that evening.   Her pulmonary status did stabilize, and she was extubated uneventfully on the evening of postop day 2.  She remained in the ICU for close monitoring, and was stable for transfer to stepdown on 09/02/2013.  Since then, she has continued to progress slowly.  She has remained in atrial fibrillation, but rate remained controlled on Amiodarone and Lopressor.  She did ultimately convert to sinus rhythm.  Chest x-ray showed a right pleural effusion, and she has been volume overloaded, requiring diuresis.  She is responding well to lasix, and chest x-ray is improving. Incisions are healing well.  She is tolerating a diet.  PT is working with the patient on mobility, and it is felt that she will require short term skilled nursing placement at discharge for further reconditioning.  She presently is medically stable for transfer to SNF once a bed is available.       Recent vital signs:  Filed Vitals:   09/07/13 0610  BP: 145/61  Pulse: 65  Temp: 98 F (36.7 C)  Resp: 18    Recent laboratory studies:  CBC: Recent Labs  09/06/13 0536  WBC 7.4  HGB 10.5*  HCT 30.6*  PLT 406*   BMET:  Recent Labs  09/05/13 0407 09/06/13 0536  NA 134* 133*  K 4.0 4.3  CL 101 100  CO2 23 23  GLUCOSE 102* 103*  BUN 9 11  CREATININE 0.61 0.69  CALCIUM 8.4 8.5    PT/INR:  Recent Labs  09/06/13 0536  LABPROT 15.3*  INR 1.24     Discharge Medications:     Medication List    STOP taking these medications       losartan 100 MG tablet  Commonly known as:  COZAAR      metoprolol succinate 50 MG 24 hr tablet  Commonly known as:  TOPROL-XL      TAKE these medications       amiodarone 200 MG tablet  Commonly known as:  PACERONE  Take 200 mg by mouth daily.     aspirin 325 MG EC tablet  Take 1 tablet (325 mg total) by mouth daily.     calcium-vitamin D 500-200 MG-UNIT per tablet  Commonly known as:  OSCAL WITH D  Take 1 tablet by mouth daily.     cholecalciferol 400 UNITS Tabs tablet  Commonly known as:  VITAMIN D  Take 400 Units by mouth daily.     feeding supplement (ENSURE COMPLETE) Liqd  Take 237 mLs by mouth 2 (two) times daily between meals.     furosemide 40 MG tablet  Commonly known as:  LASIX  Take 1 tablet (40 mg total) by mouth daily.     levothyroxine 25 MCG tablet  Commonly known as:  SYNTHROID, LEVOTHROID  Take 1 tablet (25 mcg total) by mouth daily before breakfast.     magnesium oxide 400 MG tablet  Commonly known as:  MAG-OX  Take 400 mg by mouth daily.     Melatonin 3 MG Tabs  Take 3 mg by mouth at bedtime.     metoprolol 50 MG tablet  Commonly known as:  LOPRESSOR  Take 1 tablet (50 mg total) by mouth 2 (two) times daily.     omeprazole 20 MG capsule  Commonly known as:  PRILOSEC  Take 20 mg by mouth daily.     potassium chloride SA 20 MEQ tablet  Commonly known as:  K-DUR,KLOR-CON  Take 1 tablet (20 mEq total) by mouth daily.     sertraline 50 MG tablet  Commonly known as:  ZOLOFT  Take 50 mg by mouth 3 (three) times a week. Tues, Thurs and Saturday     traMADol 50 MG tablet  Commonly known as:  ULTRAM  Take 1 tablet (50 mg total) by mouth every 6 (six) hours as needed for moderate pain.        Discharge Instructions:  The patient is to refrain from driving, heavy lifting or strenuous activity.  May shower daily and clean incisions with soap and water.  May resume regular diet.   Follow Up:  Follow-up Information   Follow up with Grace Isaac, MD On 09/27/2013. (Have a chest x-ray at  [facility] at 9:30, then see MD at 10:30)    Specialty:  Cardiothoracic Surgery   Contact information:   [address] [place] [address] [phone]       Follow up with [name], MD. Schedule an appointment as soon as possible for a visit in 2 weeks.   Specialty:  Cardiology   Contact information:   [address] [place] [address] [phone]       The patient has been discharged on:   1.Beta Blocker:  Yes [  y ]                              No   [   ]                              If No, reason:  2.Ace Inhibitor/ARB: Yes [   ]                                     No  [  n  ]                                     If No, reason:  3.Statin:   Yes [   ]  No  [  n ]                  If No, reason:  4.Ecasa:  Yes  [ y  ]                  No   [   ]                  If No, reason:     COLLINS,GINA H 09/07/2013, 9:26 AM

## 2013-09-07 NOTE — Progress Notes (Signed)
Removed epicardial wires per order. 3 intact.  Pt tolerated procedure well.  Pt instructed to remain on bedrest for one hour.  Frequent vitals will be taken and documented. Pt resting with call bell within reach. Payton Emerald

## 2013-09-07 NOTE — Progress Notes (Signed)
       Patient Name: Maria [name] Date of Encounter: 09/07/2013    SUBJECTIVE: Feels stronger. Breathing improved.  TELEMETRY:  Sinus rhythm and sinus bradycardia: Filed Vitals:   09/06/13 1310 09/06/13 1645 09/06/13 1949 09/07/13 0610  BP: 108/68 145/53 138/50 145/61  Pulse: 61 65 62 65  Temp: 97.8 F (36.6 C)  97.2 F (36.2 C) 98 F (36.7 C)  TempSrc: Oral  Oral Oral  Resp: 16  18 18   Height:      Weight:    121 lb 4.1 oz (55 kg)  SpO2: 93%  98% 98%    Intake/Output Summary (Last 24 hours) at 09/07/13 1356 Last data filed at 09/07/13 1214  Gross per 24 hour  Intake    363 ml  Output      0 ml  Net    363 ml    LABS: Basic Metabolic Panel:  Recent Labs  09/05/13 0407 09/06/13 0536  NA 134* 133*  K 4.0 4.3  CL 101 100  CO2 23 23  GLUCOSE 102* 103*  BUN 9 11  CREATININE 0.61 0.69  CALCIUM 8.4 8.5   CBC:  Recent Labs  09/06/13 0536  WBC 7.4  HGB 10.5*  HCT 30.6*  MCV 93.6  PLT 406*    Radiology/Studies:  No new data  Physical Exam: Blood pressure 145/61, pulse 65, temperature 98 F (36.7 C), temperature source Oral, resp. rate 18, height 5' 2.5" (1.588 m), weight 121 lb 4.1 oz (55 kg), SpO2 98.00%. Weight change: -4 oz (-0.112 kg)  Decreased breath sounds at bases. Regular rhythm with a faint 1-2 of 6 systolic murmur at the apex No edema  ASSESSMENT:  1. Paroxysmal atrial fibrillation, currently in normal sinus rhythm with sinus bradycardia 2. Acute on chronic diastolic heart failure. No significant diuresis on the current diuretic regimen. She is ambulating and her O2 sats are adequate 3. Status post coronary artery bypass grafting without recurrent ischemia  Plan:  1. If no recurrence of atrial fibrillation, anticoagulation therapy will be avoided 2. Will decrease amiodarone to 200 mg daily at discharge 3. Will change metoprolol tartrate 50 mg twice a day  Demetrios Isaacs 09/07/2013, 1:56 PM

## 2013-09-07 NOTE — Progress Notes (Signed)
Removed epicardial wires per order. 3 intact.  Pt tolerated procedure well.  Pt instructed to remain on bedrest for one hour.  Frequent vitals will be taken and documented. Pt resting with call bell within reach. Maria [name]

## 2013-09-08 DIAGNOSIS — R404 Transient alteration of awareness: Secondary | ICD-10-CM | POA: Diagnosis not present

## 2013-09-08 DIAGNOSIS — I5033 Acute on chronic diastolic (congestive) heart failure: Secondary | ICD-10-CM | POA: Diagnosis present

## 2013-09-08 DIAGNOSIS — Z48812 Encounter for surgical aftercare following surgery on the circulatory system: Secondary | ICD-10-CM | POA: Diagnosis not present

## 2013-09-08 DIAGNOSIS — K900 Celiac disease: Secondary | ICD-10-CM | POA: Diagnosis present

## 2013-09-08 DIAGNOSIS — N390 Urinary tract infection, site not specified: Secondary | ICD-10-CM | POA: Diagnosis not present

## 2013-09-08 DIAGNOSIS — Z7982 Long term (current) use of aspirin: Secondary | ICD-10-CM | POA: Diagnosis not present

## 2013-09-08 DIAGNOSIS — R0602 Shortness of breath: Secondary | ICD-10-CM | POA: Diagnosis not present

## 2013-09-08 DIAGNOSIS — D649 Anemia, unspecified: Secondary | ICD-10-CM | POA: Diagnosis present

## 2013-09-08 DIAGNOSIS — E871 Hypo-osmolality and hyponatremia: Secondary | ICD-10-CM | POA: Diagnosis not present

## 2013-09-08 DIAGNOSIS — M6281 Muscle weakness (generalized): Secondary | ICD-10-CM | POA: Diagnosis not present

## 2013-09-08 DIAGNOSIS — I509 Heart failure, unspecified: Secondary | ICD-10-CM | POA: Diagnosis present

## 2013-09-08 DIAGNOSIS — E039 Hypothyroidism, unspecified: Secondary | ICD-10-CM | POA: Diagnosis present

## 2013-09-08 DIAGNOSIS — F329 Major depressive disorder, single episode, unspecified: Secondary | ICD-10-CM | POA: Diagnosis present

## 2013-09-08 DIAGNOSIS — I4891 Unspecified atrial fibrillation: Secondary | ICD-10-CM | POA: Diagnosis present

## 2013-09-08 DIAGNOSIS — M810 Age-related osteoporosis without current pathological fracture: Secondary | ICD-10-CM | POA: Diagnosis present

## 2013-09-08 DIAGNOSIS — G4733 Obstructive sleep apnea (adult) (pediatric): Secondary | ICD-10-CM | POA: Diagnosis present

## 2013-09-08 DIAGNOSIS — H3530 Unspecified macular degeneration: Secondary | ICD-10-CM | POA: Diagnosis present

## 2013-09-08 DIAGNOSIS — E441 Mild protein-calorie malnutrition: Secondary | ICD-10-CM | POA: Diagnosis present

## 2013-09-08 DIAGNOSIS — Z951 Presence of aortocoronary bypass graft: Secondary | ICD-10-CM | POA: Diagnosis not present

## 2013-09-08 DIAGNOSIS — R0789 Other chest pain: Secondary | ICD-10-CM | POA: Diagnosis not present

## 2013-09-08 DIAGNOSIS — R488 Other symbolic dysfunctions: Secondary | ICD-10-CM | POA: Diagnosis not present

## 2013-09-08 DIAGNOSIS — I10 Essential (primary) hypertension: Secondary | ICD-10-CM | POA: Diagnosis present

## 2013-09-08 DIAGNOSIS — F411 Generalized anxiety disorder: Secondary | ICD-10-CM | POA: Diagnosis present

## 2013-09-08 DIAGNOSIS — Z800 Family history of malignant neoplasm of digestive organs: Secondary | ICD-10-CM | POA: Diagnosis not present

## 2013-09-08 DIAGNOSIS — Z79899 Other long term (current) drug therapy: Secondary | ICD-10-CM | POA: Diagnosis not present

## 2013-09-08 DIAGNOSIS — E559 Vitamin D deficiency, unspecified: Secondary | ICD-10-CM | POA: Diagnosis present

## 2013-09-08 DIAGNOSIS — I2510 Atherosclerotic heart disease of native coronary artery without angina pectoris: Secondary | ICD-10-CM | POA: Diagnosis not present

## 2013-09-08 DIAGNOSIS — R262 Difficulty in walking, not elsewhere classified: Secondary | ICD-10-CM | POA: Diagnosis not present

## 2013-09-08 DIAGNOSIS — G2581 Restless legs syndrome: Secondary | ICD-10-CM | POA: Diagnosis present

## 2013-09-08 DIAGNOSIS — Z5189 Encounter for other specified aftercare: Secondary | ICD-10-CM | POA: Diagnosis not present

## 2013-09-08 DIAGNOSIS — Z8249 Family history of ischemic heart disease and other diseases of the circulatory system: Secondary | ICD-10-CM | POA: Diagnosis not present

## 2013-09-08 DIAGNOSIS — Z823 Family history of stroke: Secondary | ICD-10-CM | POA: Diagnosis not present

## 2013-09-08 DIAGNOSIS — J90 Pleural effusion, not elsewhere classified: Secondary | ICD-10-CM | POA: Diagnosis not present

## 2013-09-08 DIAGNOSIS — K219 Gastro-esophageal reflux disease without esophagitis: Secondary | ICD-10-CM | POA: Diagnosis present

## 2013-09-08 MED ORDER — POTASSIUM CHLORIDE CRYS ER 20 MEQ PO TBCR: 20.0000 meq | EXTENDED_RELEASE_TABLET | Freq: Every day | ORAL | Status: DC

## 2013-09-08 MED ORDER — ENSURE COMPLETE PO LIQD: 237.0000 mL | Freq: Two times a day (BID) | ORAL | Status: DC

## 2013-09-08 MED ORDER — TRAMADOL HCL 50 MG PO TABS: 50.0000 mg | ORAL_TABLET | Freq: Four times a day (QID) | ORAL | Status: DC | PRN

## 2013-09-08 MED ORDER — ASPIRIN 325 MG PO TBEC: 325.0000 mg | DELAYED_RELEASE_TABLET | Freq: Every day | ORAL | Status: DC

## 2013-09-08 MED ORDER — METOPROLOL TARTRATE 50 MG PO TABS: 50.0000 mg | ORAL_TABLET | Freq: Two times a day (BID) | ORAL | Status: DC

## 2013-09-08 MED ORDER — LEVOTHYROXINE SODIUM 25 MCG PO TABS: 25.0000 ug | ORAL_TABLET | Freq: Every day | ORAL | Status: DC

## 2013-09-08 MED ORDER — FUROSEMIDE 40 MG PO TABS: 40.0000 mg | ORAL_TABLET | Freq: Every day | ORAL | Status: DC

## 2013-09-08 NOTE — Progress Notes (Signed)
CT sutures removed per order.  Sites painted, steri's applied.  Pt tolerated well.  No further drainage noted from right site.  Plan DC this morning.

## 2013-09-08 NOTE — Progress Notes (Signed)
Clinical Social Worker (CSW) received weekday handoff report from weekday [facility] reporting that patient is medically stable for D/C on Saturday 09/08/13. CSW faxed Lattie Haw, RN at [facility] D/C summary and prepared D/C packet. Patient is being transported to [facility] by her husband in a personal vehicle. Lattie Haw, RN at [facility] is aware of above. Nursing is aware of above. Please reconsult if further social work needs arise. CSW signing off.   Blima Rich, [facility] Weekend CSW [phone]

## 2013-09-08 NOTE — Progress Notes (Signed)
BP and HR stable on current medical regimen with no afib

## 2013-09-08 NOTE — Progress Notes (Signed)
[facility] WORK PLACEMENT NOTE 09/08/2013  Patient:  MAYLYN, NARVAIZ  Account Number:  [id number] [place] date:  08/28/2013  Clinical Social Worker:  Blima Rich, Latanya Presser  Date/time:  09/08/2013 09:11 AM  Clinical Social Work is seeking post-discharge placement for this patient at the following level of care:   [facility]   (*CSW will update this form in Epic as items are completed)     Patient/family provided with [facility] Department of Clinical Social Work's list of facilities offering this level of care within the geographic area requested by the patient (or if unable, by the patient's family).    Patient/family informed of their freedom to choose among providers that offer the needed level of care, that participate in Medicare, Medicaid or managed care program needed by the patient, have an available bed and are willing to accept the patient.    Patient/family informed of [facility]' ownership interest in [facility], as well as of the fact that they are under no obligation to receive care at this facility.  PASARR submitted to [facility] on  PASARR number received from [facility] on   FL2 transmitted to all facilities in geographic area requested by pt/family on   FL2 transmitted to all facilities within larger geographic area on   Patient informed that his/her managed care company has contracts with or will negotiate with  certain facilities, including the following:     Patient/family informed of bed offers received:   Patient chooses bed at  Physician recommends and patient chooses bed at    Patient to be transferred to [facility] on  09/08/2013 Patient to be transferred to facility by Husband in person vehicle  The following physician request were entered in Epic:   Additional Comments: Patient has an existing PASARR

## 2013-09-08 NOTE — Progress Notes (Signed)
[address]       [place] [address]             [phone]      11 Days Post-Op  Procedure(s) (LRB): CORONARY ARTERY BYPASS GRAFTING (CABG) x2 using right greater saphenous vein and left internal mammary artery.  (N/A) INTRAOPERATIVE TRANSESOPHAGEAL ECHOCARDIOGRAM (N/A) Subjective: conts to feel better, stronger- slowly  Objective  Telemetry sinus rhythm/brady   Temp:  [98 F (36.7 C)-98.3 F (36.8 C)] 98 F (36.7 C) (11/15 0512) Pulse Rate:  [60-64] 60 (11/15 0512) Resp:  [18] 18 (11/15 0512) BP: (123-148)/(43-78) 123/78 mmHg (11/15 0512) SpO2:  [94 %-96 %] 95 % (11/15 0512) Weight:  [117 lb 8.1 oz (53.3 kg)] 117 lb 8.1 oz (53.3 kg) (11/15 0512)   Intake/Output Summary (Last 24 hours) at 09/08/13 0747 Last data filed at 09/07/13 1300  Gross per 24 hour  Intake    243 ml  Output      0 ml  Net    243 ml       General appearance: alert and no distress Heart: regular rate and rhythm Lungs: dim in bases Abdomen: benign Extremities: no edema Wound: incis healing well  Lab Results:  Recent Labs  09/06/13 0536  NA 133*  K 4.3  CL 100  CO2 23  GLUCOSE 103*  BUN 11  CREATININE 0.69  CALCIUM 8.5   No results found for this basename: AST, ALT, ALKPHOS, BILITOT, PROT, ALBUMIN,  in the last 72 hours No results found for this basename: LIPASE, AMYLASE,  in the last 72 hours  Recent Labs  09/06/13 0536  WBC 7.4  HGB 10.5*  HCT 30.6*  MCV 93.6  PLT 406*   No results found for this basename: CKTOTAL, CKMB, TROPONINI,  in the last 72 hours No components found with this basename: POCBNP,  No results found for this basename: DDIMER,  in the last 72 hours No results found for this basename: HGBA1C,  in the last 72 hours No results found for this basename: CHOL, HDL, LDLCALC, TRIG, CHOLHDL,  in the last 72 hours No results found for this basename: TSH, T4TOTAL, FREET3, T3FREE, THYROIDAB,  in the last 72 hours No results found for  this basename: VITAMINB12, FOLATE, FERRITIN, TIBC, IRON, RETICCTPCT,  in the last 72 hours  Medications: Scheduled . amiodarone  200 mg Oral Daily  . antiseptic oral rinse  15 mL Mouth Rinse BID  . aspirin EC  325 mg Oral Daily   Or  . aspirin  324 mg Per Tube Daily  . enoxaparin (LOVENOX) injection  40 mg Subcutaneous Q24H  . feeding supplement (ENSURE COMPLETE)  237 mL Oral BID BM  . furosemide  40 mg Oral Daily  . levothyroxine  25 mcg Oral QAC breakfast  . metoprolol tartrate  50 mg Oral BID  . pantoprazole  40 mg Oral Daily  . potassium chloride  20 mEq Oral Daily  . sertraline  50 mg Oral 3 times weekly  . sodium chloride  3 mL Intravenous Q12H     Radiology/Studies:  No results found.  INR: Will add last result for INR, ABG once components are confirmed Will add last 4 CBG results once components are confirmed  Assessment/Plan: S/P Procedure(s) (LRB): CORONARY ARTERY BYPASS GRAFTING (CABG) x2 using right greater saphenous vein and left internal mammary artery.  (N/A) INTRAOPERATIVE TRANSESOPHAGEAL ECHOCARDIOGRAM (N/A)  1 remains stable, appears ready for transfer to SNF,  2  will go on Dr Thompson Caul recs for meds re: afib   LOS: 11 days    Maria [name],Maria [name] 11/15/20147:47 AM

## 2013-09-11 ENCOUNTER — Inpatient Hospital Stay (HOSPITAL_COMMUNITY)
Admission: EM | Admit: 2013-09-11 | Discharge: 2013-09-15 | DRG: 292 | Disposition: A | Discharge status: Home / Self Care | Payer: Medicare Other | Attending: Interventional Cardiology | Admitting: Interventional Cardiology

## 2013-09-11 ENCOUNTER — Encounter (HOSPITAL_COMMUNITY): Payer: Self-pay | Admitting: Emergency Medicine

## 2013-09-11 DIAGNOSIS — Z951 Presence of aortocoronary bypass graft: Secondary | ICD-10-CM

## 2013-09-11 DIAGNOSIS — J9819 Other pulmonary collapse: Secondary | ICD-10-CM | POA: Diagnosis not present

## 2013-09-11 DIAGNOSIS — Z79899 Other long term (current) drug therapy: Secondary | ICD-10-CM

## 2013-09-11 DIAGNOSIS — I10 Essential (primary) hypertension: Secondary | ICD-10-CM | POA: Diagnosis not present

## 2013-09-11 DIAGNOSIS — K900 Celiac disease: Secondary | ICD-10-CM | POA: Diagnosis present

## 2013-09-11 DIAGNOSIS — R262 Difficulty in walking, not elsewhere classified: Secondary | ICD-10-CM | POA: Diagnosis not present

## 2013-09-11 DIAGNOSIS — I6529 Occlusion and stenosis of unspecified carotid artery: Secondary | ICD-10-CM | POA: Diagnosis present

## 2013-09-11 DIAGNOSIS — R404 Transient alteration of awareness: Secondary | ICD-10-CM | POA: Diagnosis not present

## 2013-09-11 DIAGNOSIS — I2510 Atherosclerotic heart disease of native coronary artery without angina pectoris: Secondary | ICD-10-CM | POA: Diagnosis present

## 2013-09-11 DIAGNOSIS — M810 Age-related osteoporosis without current pathological fracture: Secondary | ICD-10-CM | POA: Diagnosis present

## 2013-09-11 DIAGNOSIS — I5033 Acute on chronic diastolic (congestive) heart failure: Secondary | ICD-10-CM | POA: Diagnosis not present

## 2013-09-11 DIAGNOSIS — I480 Paroxysmal atrial fibrillation: Secondary | ICD-10-CM | POA: Diagnosis present

## 2013-09-11 DIAGNOSIS — E559 Vitamin D deficiency, unspecified: Secondary | ICD-10-CM | POA: Diagnosis present

## 2013-09-11 DIAGNOSIS — H3530 Unspecified macular degeneration: Secondary | ICD-10-CM | POA: Diagnosis present

## 2013-09-11 DIAGNOSIS — D649 Anemia, unspecified: Secondary | ICD-10-CM | POA: Diagnosis present

## 2013-09-11 DIAGNOSIS — I509 Heart failure, unspecified: Secondary | ICD-10-CM | POA: Diagnosis present

## 2013-09-11 DIAGNOSIS — K219 Gastro-esophageal reflux disease without esophagitis: Secondary | ICD-10-CM | POA: Diagnosis present

## 2013-09-11 DIAGNOSIS — R0789 Other chest pain: Secondary | ICD-10-CM | POA: Diagnosis not present

## 2013-09-11 DIAGNOSIS — Z7982 Long term (current) use of aspirin: Secondary | ICD-10-CM

## 2013-09-11 DIAGNOSIS — F329 Major depressive disorder, single episode, unspecified: Secondary | ICD-10-CM | POA: Diagnosis present

## 2013-09-11 DIAGNOSIS — R488 Other symbolic dysfunctions: Secondary | ICD-10-CM | POA: Diagnosis not present

## 2013-09-11 DIAGNOSIS — R4181 Age-related cognitive decline: Secondary | ICD-10-CM | POA: Diagnosis present

## 2013-09-11 DIAGNOSIS — Z823 Family history of stroke: Secondary | ICD-10-CM | POA: Diagnosis not present

## 2013-09-11 DIAGNOSIS — G4733 Obstructive sleep apnea (adult) (pediatric): Secondary | ICD-10-CM | POA: Diagnosis present

## 2013-09-11 DIAGNOSIS — J90 Pleural effusion, not elsewhere classified: Secondary | ICD-10-CM | POA: Diagnosis present

## 2013-09-11 DIAGNOSIS — E871 Hypo-osmolality and hyponatremia: Secondary | ICD-10-CM | POA: Diagnosis present

## 2013-09-11 DIAGNOSIS — I4891 Unspecified atrial fibrillation: Secondary | ICD-10-CM | POA: Diagnosis present

## 2013-09-11 DIAGNOSIS — E441 Mild protein-calorie malnutrition: Secondary | ICD-10-CM | POA: Diagnosis present

## 2013-09-11 DIAGNOSIS — Z8249 Family history of ischemic heart disease and other diseases of the circulatory system: Secondary | ICD-10-CM | POA: Diagnosis not present

## 2013-09-11 DIAGNOSIS — I5032 Chronic diastolic (congestive) heart failure: Secondary | ICD-10-CM

## 2013-09-11 DIAGNOSIS — R079 Chest pain, unspecified: Secondary | ICD-10-CM

## 2013-09-11 DIAGNOSIS — G2581 Restless legs syndrome: Secondary | ICD-10-CM | POA: Diagnosis present

## 2013-09-11 DIAGNOSIS — Z48812 Encounter for surgical aftercare following surgery on the circulatory system: Secondary | ICD-10-CM | POA: Diagnosis not present

## 2013-09-11 DIAGNOSIS — E039 Hypothyroidism, unspecified: Secondary | ICD-10-CM | POA: Diagnosis present

## 2013-09-11 DIAGNOSIS — N390 Urinary tract infection, site not specified: Secondary | ICD-10-CM | POA: Diagnosis not present

## 2013-09-11 DIAGNOSIS — Z800 Family history of malignant neoplasm of digestive organs: Secondary | ICD-10-CM

## 2013-09-11 DIAGNOSIS — I25709 Atherosclerosis of coronary artery bypass graft(s), unspecified, with unspecified angina pectoris: Secondary | ICD-10-CM

## 2013-09-11 DIAGNOSIS — Z5189 Encounter for other specified aftercare: Secondary | ICD-10-CM | POA: Diagnosis not present

## 2013-09-11 DIAGNOSIS — I739 Peripheral vascular disease, unspecified: Secondary | ICD-10-CM | POA: Diagnosis present

## 2013-09-11 DIAGNOSIS — F411 Generalized anxiety disorder: Secondary | ICD-10-CM | POA: Diagnosis present

## 2013-09-11 DIAGNOSIS — R0602 Shortness of breath: Secondary | ICD-10-CM | POA: Diagnosis not present

## 2013-09-11 DIAGNOSIS — N312 Flaccid neuropathic bladder, not elsewhere classified: Secondary | ICD-10-CM | POA: Diagnosis present

## 2013-09-11 DIAGNOSIS — M6281 Muscle weakness (generalized): Secondary | ICD-10-CM | POA: Diagnosis not present

## 2013-09-11 DIAGNOSIS — F3289 Other specified depressive episodes: Secondary | ICD-10-CM | POA: Diagnosis present

## 2013-09-11 NOTE — ED Notes (Addendum)
Pt arrives via EMS from [facility]. Pt is 2 weeks postop CABG. Drains removed 5 days ago. Pt c/o of 4 days of orthopnea. Pt states she becomes very anxious and SOB when lying down. 96% RA BP 170/80 56. Pts husband states that post CABG pt was afib and converted several days later.

## 2013-09-12 ENCOUNTER — Observation Stay (HOSPITAL_COMMUNITY): Payer: Medicare Other

## 2013-09-12 ENCOUNTER — Emergency Department (HOSPITAL_COMMUNITY): Payer: Medicare Other

## 2013-09-12 DIAGNOSIS — J90 Pleural effusion, not elsewhere classified: Secondary | ICD-10-CM

## 2013-09-12 DIAGNOSIS — E871 Hypo-osmolality and hyponatremia: Secondary | ICD-10-CM

## 2013-09-12 DIAGNOSIS — I10 Essential (primary) hypertension: Secondary | ICD-10-CM

## 2013-09-12 DIAGNOSIS — I25709 Atherosclerosis of coronary artery bypass graft(s), unspecified, with unspecified angina pectoris: Secondary | ICD-10-CM

## 2013-09-12 DIAGNOSIS — R079 Chest pain, unspecified: Secondary | ICD-10-CM

## 2013-09-12 DIAGNOSIS — R0602 Shortness of breath: Secondary | ICD-10-CM

## 2013-09-12 DIAGNOSIS — I4891 Unspecified atrial fibrillation: Secondary | ICD-10-CM

## 2013-09-12 LAB — BASIC METABOLIC PANEL
BUN: 10 mg/dL (ref 6–23)
CO2: 24 mEq/L (ref 19–32)
CO2: 24 mEq/L (ref 19–32)
Chloride: 93 mEq/L — ABNORMAL LOW (ref 96–112)
Chloride: 95 mEq/L — ABNORMAL LOW (ref 96–112)
Creatinine, Ser: 0.74 mg/dL (ref 0.50–1.10)
Creatinine, Ser: 0.74 mg/dL (ref 0.50–1.10)
GFR calc Af Amer: 86 mL/min — ABNORMAL LOW (ref >90)
Glucose, Bld: 107 mg/dL — ABNORMAL HIGH (ref 70–99)
Potassium: 3.8 mEq/L (ref 3.5–5.1)

## 2013-09-12 LAB — CBC
HCT: 32 % — ABNORMAL LOW (ref 36.0–46.0)
HCT: 33.6 % — ABNORMAL LOW (ref 36.0–46.0)
MCH: 30.8 pg (ref 26.0–34.0)
MCHC: 33.4 g/dL (ref 30.0–36.0)
MCV: 91.3 fL (ref 78.0–100.0)
MCV: 92.2 fL (ref 78.0–100.0)
Platelets: 389 10*3/uL (ref 150–400)
Platelets: 426 10*3/uL — ABNORMAL HIGH (ref 150–400)
RBC: 3.47 MIL/uL — ABNORMAL LOW (ref 3.87–5.11)
RBC: 3.68 MIL/uL — ABNORMAL LOW (ref 3.87–5.11)
RDW: 15.4 % (ref 11.5–15.5)
RDW: 15.5 % (ref 11.5–15.5)
WBC: 8.3 10*3/uL (ref 4.0–10.5)
WBC: 8.8 10*3/uL (ref 4.0–10.5)

## 2013-09-12 LAB — PRO B NATRIURETIC PEPTIDE: Pro B Natriuretic peptide (BNP): 1314 pg/mL — ABNORMAL HIGH (ref 0–450)

## 2013-09-12 LAB — POCT I-STAT TROPONIN I

## 2013-09-12 LAB — TROPONIN I: Troponin I: <0.3 ng/mL (ref <0.30)

## 2013-09-12 MED ORDER — ASPIRIN 81 MG PO CHEW
324.0000 mg | CHEWABLE_TABLET | Freq: Once | ORAL | Status: AC
  Administered 2013-09-12: 324 mg via ORAL

## 2013-09-12 MED ORDER — SERTRALINE HCL 50 MG PO TABS
50.0000 mg | ORAL_TABLET | ORAL | Status: DC
  Administered 2013-09-13 – 2013-09-15 (×2): 50 mg via ORAL
  Filled 2013-09-12 (×2): qty 1

## 2013-09-12 MED ORDER — METOPROLOL TARTRATE 25 MG PO TABS
25.0000 mg | ORAL_TABLET | Freq: Two times a day (BID) | ORAL | Status: DC
  Administered 2013-09-12 – 2013-09-14 (×6): 25 mg via ORAL
  Filled 2013-09-12 (×8): qty 1

## 2013-09-12 MED ORDER — CALCIUM CARBONATE-VITAMIN D 500-200 MG-UNIT PO TABS
1.0000 | ORAL_TABLET | Freq: Every day | ORAL | Status: DC
  Administered 2013-09-12 – 2013-09-15 (×4): 1 via ORAL
  Filled 2013-09-12 (×4): qty 1

## 2013-09-12 MED ORDER — AMIODARONE HCL 200 MG PO TABS
200.0000 mg | ORAL_TABLET | Freq: Two times a day (BID) | ORAL | Status: DC
  Administered 2013-09-12 – 2013-09-13 (×4): 200 mg via ORAL
  Filled 2013-09-12 (×6): qty 1

## 2013-09-12 MED ORDER — POTASSIUM CHLORIDE CRYS ER 20 MEQ PO TBCR
20.0000 meq | EXTENDED_RELEASE_TABLET | Freq: Every day | ORAL | Status: DC
  Administered 2013-09-12 – 2013-09-13 (×2): 20 meq via ORAL
  Filled 2013-09-12 (×3): qty 1

## 2013-09-12 MED ORDER — ONDANSETRON HCL 4 MG/2ML IJ SOLN
INTRAMUSCULAR | Status: AC
  Filled 2013-09-12: qty 2

## 2013-09-12 MED ORDER — LEVOTHYROXINE SODIUM 50 MCG PO TABS
50.0000 ug | ORAL_TABLET | Freq: Every day | ORAL | Status: DC
  Administered 2013-09-12 – 2013-09-15 (×4): 50 ug via ORAL
  Filled 2013-09-12 (×5): qty 1

## 2013-09-12 MED ORDER — ONDANSETRON HCL 4 MG/2ML IJ SOLN
4.0000 mg | Freq: Once | INTRAMUSCULAR | Status: AC
  Administered 2013-09-12: 4 mg via INTRAVENOUS

## 2013-09-12 MED ORDER — ASPIRIN EC 325 MG PO TBEC
325.0000 mg | DELAYED_RELEASE_TABLET | Freq: Every day | ORAL | Status: DC
  Administered 2013-09-12 – 2013-09-15 (×4): 325 mg via ORAL
  Filled 2013-09-12 (×4): qty 1

## 2013-09-12 MED ORDER — PANTOPRAZOLE SODIUM 40 MG PO TBEC
40.0000 mg | DELAYED_RELEASE_TABLET | Freq: Every day | ORAL | Status: DC
  Administered 2013-09-12 – 2013-09-15 (×4): 40 mg via ORAL
  Filled 2013-09-12 (×4): qty 1

## 2013-09-12 MED ORDER — MAGNESIUM OXIDE 400 (241.3 MG) MG PO TABS
400.0000 mg | ORAL_TABLET | Freq: Every day | ORAL | Status: DC
  Administered 2013-09-12 – 2013-09-15 (×4): 400 mg via ORAL
  Filled 2013-09-12 (×4): qty 1

## 2013-09-12 MED ORDER — FUROSEMIDE 40 MG PO TABS
40.0000 mg | ORAL_TABLET | Freq: Every day | ORAL | Status: DC
Start: 2013-09-12 — End: 2013-09-12
  Administered 2013-09-12: 40 mg via ORAL
  Filled 2013-09-12: qty 1

## 2013-09-12 MED ORDER — ASPIRIN 81 MG PO CHEW
CHEWABLE_TABLET | ORAL | Status: AC
  Filled 2013-09-12: qty 4

## 2013-09-12 MED ORDER — FUROSEMIDE 10 MG/ML IJ SOLN
40.0000 mg | Freq: Once | INTRAMUSCULAR | Status: AC
  Administered 2013-09-12: 40 mg via INTRAVENOUS
  Filled 2013-09-12: qty 4

## 2013-09-12 MED ORDER — ENOXAPARIN SODIUM 40 MG/0.4ML ~~LOC~~ SOLN
40.0000 mg | Freq: Every day | SUBCUTANEOUS | Status: DC
  Administered 2013-09-12 – 2013-09-15 (×4): 40 mg via SUBCUTANEOUS
  Filled 2013-09-12 (×4): qty 0.4

## 2013-09-12 MED ORDER — ACETAMINOPHEN 325 MG PO TABS
650.0000 mg | ORAL_TABLET | ORAL | Status: DC | PRN
  Administered 2013-09-14: 650 mg via ORAL
  Filled 2013-09-12: qty 2

## 2013-09-12 NOTE — Plan of Care (Signed)
Problem: Phase I Progression Outcomes Goal: Initial discharge plan identified Outcome: Completed/Met Date Met:  09/12/13 From [facility] living with husband

## 2013-09-12 NOTE — Progress Notes (Addendum)
[address]       [place] [address]             [phone]           Subjective: Re-admitted for SOB, conts with pleural fluid drainage from CT site  Objective  Telemetry sinys brady  Temp:  [97.3 F (36.3 C)-98.6 F (37 C)] 98.4 F (36.9 C) (11/19 1037) Pulse Rate:  [50-55] 53 (11/19 1037) Resp:  [16-24] 24 (11/19 1037) BP: (132-147)/(43-75) 132/43 mmHg (11/19 1037) SpO2:  [94 %-98 %] 94 % (11/19 1037) Weight:  [114 lb 13.8 oz (52.1 kg)] 114 lb 13.8 oz (52.1 kg) (11/19 0415)   Intake/Output Summary (Last 24 hours) at 09/12/13 1112 Last data filed at 09/12/13 0902  Gross per 24 hour  Intake    120 ml  Output      0 ml  Net    120 ml       General appearance: alert, cooperative and no distress Heart: regular rate and rhythm and brady Lungs: dim in bases Abdomen: soft, non-tender Extremities: minor edema on right Wound: healing well  Lab Results:  Recent Labs  09/12/13 0011 09/12/13 0820  NA 127* 130*  K 3.8 3.8  CL 93* 95*  CO2 24 24  GLUCOSE 107* 118*  BUN 10 9  CREATININE 0.74 0.74  CALCIUM 8.7 8.4   No results found for this basename: AST, ALT, ALKPHOS, BILITOT, PROT, ALBUMIN,  in the last 72 hours No results found for this basename: LIPASE, AMYLASE,  in the last 72 hours  Recent Labs  09/12/13 0011 09/12/13 0820  WBC 8.8 8.3  HGB 11.6* 10.7*  HCT 33.6* 32.0*  MCV 91.3 92.2  PLT 426* 389    Recent Labs  09/12/13 0015  TROPONINI <0.30   No components found with this basename: POCBNP,  No results found for this basename: DDIMER,  in the last 72 hours No results found for this basename: HGBA1C,  in the last 72 hours No results found for this basename: CHOL, HDL, LDLCALC, TRIG, CHOLHDL,  in the last 72 hours No results found for this basename: TSH, T4TOTAL, FREET3, T3FREE, THYROIDAB,  in the last 72 hours No results found for this basename: VITAMINB12, FOLATE, FERRITIN, TIBC, IRON, RETICCTPCT,  in the last 72  hours  Medications: Scheduled . amiodarone  200 mg Oral BID  . aspirin EC  325 mg Oral Daily  . calcium-vitamin D  1 tablet Oral Daily  . enoxaparin (LOVENOX) injection  40 mg Subcutaneous Daily  . furosemide  40 mg Intravenous Once  . levothyroxine  50 mcg Oral QAC breakfast  . magnesium oxide  400 mg Oral Daily  . metoprolol  25 mg Oral BID  . pantoprazole  40 mg Oral Daily  . potassium chloride SA  20 mEq Oral Daily  . [START ON 09/13/2013] sertraline  50 mg Oral 3 times weekly     Radiology/Studies:  Dg Chest 2 View  09/12/2013   CLINICAL DATA:  Chest pain.  EXAM: CHEST  2 VIEW  COMPARISON:  09/12/2013.  FINDINGS: The cardiac silhouette, mediastinal and hilar contours are stable. Stable tortuosity and calcification of the thoracic aorta. A left atrial appendage occlusion device is noted. There are moderate size bilateral pleural effusions, left greater than right with overlying atelectasis. No pulmonary edema or pneumothorax.  IMPRESSION: Persistent bilateral pleural effusions and overlying atelectasis.   Electronically Signed   By: Veneta Penton.D.  On: 09/12/2013 07:54   Dg Chest Port 1 View  09/12/2013   CLINICAL DATA:  Shortness of breath, recent CABG.  EXAM: PORTABLE CHEST - 1 VIEW  COMPARISON:  Chest radiograph September 05, 2013  FINDINGS: Cardiac silhouette remains moderately enlarged, status post coronary artery bypass grafting and median sternotomy. Mildly calcified aortic knob man tortuous aorta.  Increasing bibasilar airspace opacities with moderate left, small to moderate right pleural effusion, increased on the left. No pneumothorax.  Multiple EKG lines overlie the patient and may obscure subtle underlying pathology. Patient is osteopenic.  IMPRESSION: Increasing moderate left, similar small a moderate right pleural effusions with bibasilar airspace opacities which may reflect confluent edema/atelectasis.  Stable cardiomegaly, status post CABG.   Electronically Signed    By: Elon Alas   On: 09/12/2013 01:10    INR: Will add last result for INR, ABG once components are confirmed Will add last 4 CBG results once components are confirmed  Assessment/Plan: 1 symptomatic pleural effusions, L>R. Will order US guided thoracentesis for left, may need right side in future   LOS: 1 day    Maria [name],Maria [name] 11/19/201411:12 AM  Plan left thoracentesis  first left then  the right the next day Hgb good stable Mild hyponatremia 130 I have seen and examined Maria [name] and agree with the above assessment  and plan.  Grace Isaac MD Beeper [phone] Office [phone] 09/12/2013 7:09 PM

## 2013-09-12 NOTE — ED Notes (Signed)
Dr. Roxanne Mins aware of the Crit lab result from [place]

## 2013-09-12 NOTE — ED Provider Notes (Signed)
CSN: [id number]     Arrival date & time 09/11/13  2347 History   First MD Initiated Contact with Patient 09/12/13 0012     Chief Complaint  Patient presents with  . Shortness of Breath   (Consider location/radiation/quality/duration/timing/severity/associated sxs/prior Treatment) The history is provided by the patient.   79 year old female who is 2 weeks postop two-vessel coronary artery bypass states that at 02 100 she woke up feeling jittery in her heart and was unable to lay flat. She felt somewhat better during the day but when she laid down to go to bed tonight, she noted that she was unable to lay flat he noted a pressure feeling in the left side of her chest. That has since resolved. There is some nausea but she denies any dyspnea. She denies fever or chills. She continues to have discomfort in her chest from her sternotomy but no recent change. There's been no vomiting or diarrhea. She denies cough. She denies diaphoresis.  Past Medical History  Diagnosis Date  . Osteoporosis   . Celiac disease   . Celiac disease   . Carotid artery occlusion     right carotid stenosis 40-60%, 4/08, neg for stenosis repeat study 11/11  . Depression   . Presbycusis   . Macular degeneration   . Urethral stricture   . Moderate aortic insufficiency   . Moderate mitral regurgitation by prior echocardiogram     echo 9/13   . Thyroid nodule     41m, no change 11/11  . RLS (restless legs syndrome)   . Vitamin D deficiency   . Heart murmur   . Pulmonary hypertension   . Hypertension     takes Metoprolol daily  . Atrial fibrillation   . Coronary artery disease   . CHF (congestive heart failure)     takes Lasix daily  . MVP (mitral valve prolapse)   . Moderate obstructive sleep apnea     uses CPAP;sleep study about 4-530monthago  . Hearing loss     both ears  . Numbness     left arm  . Joint swelling   . GERD (gastroesophageal reflux disease)     takes Omeprazole daily  . Gastric ulcer     6-64m21monthgo  . GI bleeding   . Constipation   . Diarrhea   . Hemorrhoids   . Urinary retention   . Urinary anastomotic stricture   . Hypothyroidism     takes Synthroid daily as result of AMiodarone  . Hot flashes     takes ZOloft 3 times a week  . Insomnia     takes Melatonin nightly  . Restless leg   . Peripheral edema     left   Past Surgical History  Procedure Laterality Date  . Appendectomy    . Tubal ligation    . Tonsillectomy    . Trigger thumb    . Cardiac catheterization  08-06-13  . Tcs    . Bilateral cataract surgery    . Mandible surgery    . Coronary artery bypass graft N/A 08/28/2013    Procedure: CORONARY ARTERY BYPASS GRAFTING (CABG) x2 using right greater saphenous vein and left internal mammary artery. ;  Surgeon: EdwGrace IsaacD;  [facility];  Laterality: N/A;  . Intraoperative transesophageal echocardiogram N/A 08/28/2013    Procedure: INTRAOPERATIVE TRANSESOPHAGEAL ECHOCARDIOGRAM;  Surgeon: EdwGrace IsaacD;  [facility];  Laterality: N/A;  Family History  Problem Relation Age of Onset  . Heart attack Mother   . CVA Mother   . CAD Father   . Colon cancer Father   . Heart attack Father   . Heart attack Brother   . Peripheral vascular disease Brother   . AAA (abdominal aortic aneurysm) Brother    History  Substance Use Topics  . Smoking status: Never Smoker   . Smokeless tobacco: Not on file  . Alcohol Use: No   OB History   Grav Para Term Preterm Abortions TAB SAB Ect Mult Living                 Review of Systems  All other systems reviewed and are negative.    Allergies  Amoxicillin; Codeine; Fosamax; Gluten meal; Hctz; and Tetanus toxoids  Home Medications   Current Outpatient Rx  Name  Route  Sig  Dispense  Refill  . amiodarone (PACERONE) 200 MG tablet   Oral   Take 200 mg by mouth daily.         Marland Kitchen aspirin EC 325 MG EC tablet   Oral   Take  1 tablet (325 mg total) by mouth daily.         . calcium-vitamin D (OSCAL WITH D) 500-200 MG-UNIT per tablet   Oral   Take 1 tablet by mouth daily.         . cholecalciferol (VITAMIN D) 400 UNITS TABS tablet   Oral   Take 400 Units by mouth daily.         . feeding supplement, ENSURE COMPLETE, (ENSURE COMPLETE) LIQD   Oral   Take 237 mLs by mouth 2 (two) times daily between meals.         . furosemide (LASIX) 40 MG tablet   Oral   Take 1 tablet (40 mg total) by mouth daily.   14 tablet   0   . levothyroxine (SYNTHROID, LEVOTHROID) 25 MCG tablet   Oral   Take 1 tablet (25 mcg total) by mouth daily before breakfast.   30 tablet   1   . magnesium oxide (MAG-OX) 400 MG tablet   Oral   Take 400 mg by mouth daily.         . Melatonin 3 MG TABS   Oral   Take 3 mg by mouth at bedtime.          . metoprolol (LOPRESSOR) 50 MG tablet   Oral   Take 1 tablet (50 mg total) by mouth 2 (two) times daily.   60 tablet   1   . omeprazole (PRILOSEC) 20 MG capsule   Oral   Take 20 mg by mouth daily.         . potassium chloride SA (K-DUR,KLOR-CON) 20 MEQ tablet   Oral   Take 1 tablet (20 mEq total) by mouth daily.   14 tablet   0   . sertraline (ZOLOFT) 50 MG tablet   Oral   Take 50 mg by mouth 3 (three) times a week. Tues, Thurs and Saturday         . traMADol (ULTRAM) 50 MG tablet   Oral   Take 1 tablet (50 mg total) by mouth every 6 (six) hours as needed for moderate pain.   50 tablet   0    BP 147/75  Pulse 55  Temp(Src) 97.7 F (36.5 C) (Oral)  Resp 16  SpO2 96% Physical Exam  Nursing note and vitals reviewed.  79 year old female, resting comfortably and in no acute distress. Vital signs are significant for a systolic hypertension with blood pressure 147/75, mild bradycardia with a rate of 55. Oxygen saturation is 96%, which is normal. Head is normocephalic and atraumatic. PERRLA, EOMI. Oropharynx is clear. Neck is nontender and supple without  adenopathy or JVD. Back is nontender and there is no CVA tenderness. Lungs are clear without rales, wheezes, or rhonchi. Chest: Sternotomy scar is present and healing appropriately. Chest is tender over her sternotomy area but no other chest wall tenderness is identified. There is no crepitus. Heart has regular rate and rhythm without murmur. Abdomen is soft, flat, nontender without masses or hepatosplenomegaly and peristalsis is normoactive. Extremities have no cyanosis or edema, full range of motion is present. Skin is warm and dry without rash. Neurologic: Mental status is normal, cranial nerves are intact, there are no motor or sensory deficits.  ED Course  Procedures (including critical care time) Labs Review Results for orders placed during the hospital encounter of 09/11/13  CBC      Result Value Range   WBC 8.8  4.0 - 10.5 K/uL   RBC 3.68 (*) 3.87 - 5.11 MIL/uL   Hemoglobin 11.6 (*) 12.0 - 15.0 g/dL   HCT 33.6 (*) 36.0 - 46.0 %   MCV 91.3  78.0 - 100.0 fL   MCH 31.5  26.0 - 34.0 pg   MCHC 34.5  30.0 - 36.0 g/dL   RDW 15.5  11.5 - 15.5 %   Platelets 426 (*) 150 - 400 K/uL  BASIC METABOLIC PANEL      Result Value Range   Sodium 127 (*) 135 - 145 mEq/L   Potassium 3.8  3.5 - 5.1 mEq/L   Chloride 93 (*) 96 - 112 mEq/L   CO2 24  19 - 32 mEq/L   Glucose, Bld 107 (*) 70 - 99 mg/dL   BUN 10  6 - 23 mg/dL   Creatinine, Ser 0.74  0.50 - 1.10 mg/dL   Calcium 8.7  8.4 - 10.5 mg/dL   GFR calc non Af Amer 74 (*) >90 mL/min   GFR calc Af Amer 86 (*) >90 mL/min  PRO B NATRIURETIC PEPTIDE      Result Value Range   Pro B Natriuretic peptide (BNP) 1314.0 (*) 0 - 450 pg/mL  TROPONIN I      Result Value Range   Troponin I <0.30  <0.30 ng/mL  POCT I-STAT TROPONIN I      Result Value Range   Troponin i, poc 0.10 (*) 0.00 - 0.08 ng/mL   Comment NOTIFIED PHYSICIAN     Comment 3            Imaging Review Dg Chest Port 1 View  09/12/2013   CLINICAL DATA:  Shortness of breath, recent  CABG.  EXAM: PORTABLE CHEST - 1 VIEW  COMPARISON:  Chest radiograph September 05, 2013  FINDINGS: Cardiac silhouette remains moderately enlarged, status post coronary artery bypass grafting and median sternotomy. Mildly calcified aortic knob man tortuous aorta.  Increasing bibasilar airspace opacities with moderate left, small to moderate right pleural effusion, increased on the left. No pneumothorax.  Multiple EKG lines overlie the patient and may obscure subtle underlying pathology. Patient is osteopenic.  IMPRESSION: Increasing moderate left, similar small a moderate right pleural effusions with bibasilar airspace opacities which may reflect confluent edema/atelectasis.  Stable cardiomegaly, status post CABG.   Electronically Signed   By: Elon Alas  On: 09/12/2013 01:10   Images viewed by me.  EKG Interpretation    Date/Time:  Wednesday September 12 2013 00:02:24 EST Ventricular Rate:  52 PR Interval:  213 QRS Duration: 122 QT Interval:  660 QTC Calculation: 614 R Axis:   -56 Text Interpretation:  Sinus rhythm Borderline prolonged PR interval IVCD, consider atypical RBBB Inferior infarct, old Probable anterior infarct, age indeterminate When compared with ECG of 08/29/2013, T wave inversion Anterior leads is less evident Confirmed by [facility]  MD, Shemeca Lukasik ([id number]) on 09/12/2013 12:30:50 AM            MDM   1. Chest pain   2. Hyponatremia   3. Pleural effusion, bilateral    Chest discomfort of uncertain cause. ECG does not show any ischemic changes. Point-of-care troponin is borderline elevated surgical and is being repeated in the main lab. She did not have any troponin levels checked while inpatient. BNP is actually down compared with baseline when she was in the hospital.  Mean labs troponin is normal. She continues to be symptom-free in the ED. Chest x-ray shows some increase of pleural effusion on the left but some decrease in pleural effusion on the right. Case is discussed with  Dr.Whitlock of cardiology service who agrees to see the patient. Of note, she has hyponatremia with sodium of 127 which is down from baseline of about 133 potassium is normal and BUN and creatinine are normal.  Delora Fuel, MD [id number]

## 2013-09-12 NOTE — Progress Notes (Addendum)
SUBJECTIVE:  Breathing better, particularly if she sits up. Breathing is worse if she lies flat. This is what brought her to the hospital. I spoke to the PA from CT surgery. He remembered her and also recollected that when she left the hospital, she had some pleural fluid draining from her chest wall.  OBJECTIVE:   Vitals:   Filed Vitals:   09/12/13 0245 09/12/13 0308 09/12/13 0415 09/12/13 1037  BP:  132/49 135/51 132/43  Pulse: 54 50 52 53  Temp:  98.6 F (37 C) 97.3 F (36.3 C) 98.4 F (36.9 C)  TempSrc:   Oral Oral  Resp: 18 22 20 24   Height:   5' 1.5" (1.562 m)   Weight:   114 lb 13.8 oz (52.1 kg)   SpO2: 94% 98% 94% 94%   I&O's:   Intake/Output Summary (Last 24 hours) at 09/12/13 1157 Last data filed at 09/12/13 0902  Gross per 24 hour  Intake    120 ml  Output      0 ml  Net    120 ml   TELEMETRY: Reviewed telemetry pt in sinus bradycardia, normal sinus rhythm:     PHYSICAL EXAM General: Well developed, well nourished, in no acute distress Head: Eyes PERRLA, No xanthomas.   Normal cephalic and atramatic  Lungs:  Decreased breath sounds bilaterally bilaterally to auscultation and percussion. Heart:  HRRR S1 S2  No JVD. Drainage from left chest Abdomen: Bowel sounds are positive, abdomen soft and non-tender without masses or                  Hernia's noted. Msk:  Normal strength and tone for age. Extremities:  No lower extremity edema.  DP +1 Neuro: Alert and oriented X 3. Psych:  Good affect, responds appropriately   LABS: Basic Metabolic Panel:  Recent Labs  09/12/13 0011 09/12/13 0820  NA 127* 130*  K 3.8 3.8  CL 93* 95*  CO2 24 24  GLUCOSE 107* 118*  BUN 10 9  CREATININE 0.74 0.74  CALCIUM 8.7 8.4   Liver Function Tests: No results found for this basename: AST, ALT, ALKPHOS, BILITOT, PROT, ALBUMIN,  in the last 72 hours No results found for this basename: LIPASE, AMYLASE,  in the last 72 hours CBC:  Recent Labs  09/12/13 0011  09/12/13 0820  WBC 8.8 8.3  HGB 11.6* 10.7*  HCT 33.6* 32.0*  MCV 91.3 92.2  PLT 426* 389   Cardiac Enzymes:  Recent Labs  09/12/13 0015  TROPONINI <0.30   BNP: No components found with this basename: POCBNP,  D-Dimer: No results found for this basename: DDIMER,  in the last 72 hours Hemoglobin A1C: No results found for this basename: HGBA1C,  in the last 72 hours Fasting Lipid Panel: No results found for this basename: CHOL, HDL, LDLCALC, TRIG, CHOLHDL, LDLDIRECT,  in the last 72 hours Thyroid Function Tests: No results found for this basename: TSH, T4TOTAL, FREET3, T3FREE, THYROIDAB,  in the last 72 hours Anemia Panel: No results found for this basename: VITAMINB12, FOLATE, FERRITIN, TIBC, IRON, RETICCTPCT,  in the last 72 hours Coag Panel:   Lab Results  Component Value Date   INR 1.24 09/06/2013   INR 1.64* 08/28/2013   INR 1.10 08/27/2013    RADIOLOGY: Dg Chest 2 View  09/12/2013   CLINICAL DATA:  Chest pain.  EXAM: CHEST  2 VIEW  COMPARISON:  09/12/2013.  FINDINGS: The cardiac silhouette, mediastinal and hilar contours are stable. Stable tortuosity and  calcification of the thoracic aorta. A left atrial appendage occlusion device is noted. There are moderate size bilateral pleural effusions, left greater than right with overlying atelectasis. No pulmonary edema or pneumothorax.  IMPRESSION: Persistent bilateral pleural effusions and overlying atelectasis.   Electronically Signed   By: Kalman Jewels M.D.   On: 09/12/2013 07:54   Dg Chest 2 View  09/05/2013   CLINICAL DATA:  Dyspnea with history of atrial fibrillation.  EXAM: CHEST  2 VIEW  COMPARISON:  September 03, 2013.  FINDINGS: The lungs are well expanded. No left apical pneumothorax is demonstrated. There is persistent blunting of the costophrenic angles consistent with pleural effusions, but this has slightly improved since the earlier study. The cardiac silhouette remains mildly enlarged. A left atrial appendage  clip is present and unchanged. The patient has undergone previous median sternotomy and CABG. The central pulmonary vascularity is prominent. The pulmonary interstitial markings are also mildly prominent especially in the mid and lower lungs.  IMPRESSION: There has been slight interval improvement in the appearance of the chest especially of the bilateral pleural effusions. Improved aeration of both lungs has occurred. There is no evidence of a recurrent pneumothorax.   Electronically Signed   By: David  Martinique   On: 09/05/2013 09:46   Dg Chest 2 View  08/27/2013   CLINICAL DATA:  Preoperative evaluation for cardiac surgery, pulmonary hypertension, shortness of breath, coronary artery disease, mitral valve disorder, hypertension, CHF  EXAM: CHEST  2 VIEW  COMPARISON:  05/09/2013  FINDINGS: Enlargement of cardiac silhouette.  Calcified tortuous aorta.  Pulmonary vascularity normal.  COPD changes with small pleural effusions blunting the costophrenic angles.  Remaining lungs clear.  No pleural effusion or pneumothorax.  Bones demineralized with thoracolumbar scoliosis.  IMPRESSION: Enlargement of cardiac silhouette.  COPD changes.  Small bibasilar pleural effusions.   Electronically Signed   By: Lavonia Dana M.D.   On: 08/27/2013 16:49   Dg Chest Port 1 View  09/12/2013   CLINICAL DATA:  Shortness of breath, recent CABG.  EXAM: PORTABLE CHEST - 1 VIEW  COMPARISON:  Chest radiograph September 05, 2013  FINDINGS: Cardiac silhouette remains moderately enlarged, status post coronary artery bypass grafting and median sternotomy. Mildly calcified aortic knob man tortuous aorta.  Increasing bibasilar airspace opacities with moderate left, small to moderate right pleural effusion, increased on the left. No pneumothorax.  Multiple EKG lines overlie the patient and may obscure subtle underlying pathology. Patient is osteopenic.  IMPRESSION: Increasing moderate left, similar small a moderate right pleural effusions with  bibasilar airspace opacities which may reflect confluent edema/atelectasis.  Stable cardiomegaly, status post CABG.   Electronically Signed   By: Elon Alas   On: 09/12/2013 01:10   Dg Chest Port 1 View  09/03/2013   CLINICAL DATA:  CABG.  EXAM: PORTABLE CHEST - 1 VIEW  COMPARISON:  09/01/2013.  FINDINGS: Interim removal of right IJ line. Previously identified tiny left apical pneumothorax is no longer identified. Mediastinum and hilar structures are unremarkable. Mild cardiomegaly. Prior CABG. Mild pulmonary vascular prominence with bilateral pleural effusions. A component of congestive heart failure should be considered. Basilar atelectasis and or pneumonia cannot be excluded.  IMPRESSION: 1. Previously identified left apical pneumothorax is no longer identified. 2. Interim removal of right IJ tube. 3. Pulmonary interstitial prominence with bilateral pleural effusions. Component of CHF should be considered. Prior CABG.   Electronically Signed   By: Marcello Moores  Register   On: 09/03/2013 07:25   Dg Chest [facility]  1 View  09/01/2013   CLINICAL DATA:  Postop chest tube check  EXAM: PORTABLE CHEST - 1 VIEW  COMPARISON:  DG CHEST 1V PORT dated 08/31/2013; DG CHEST 1V PORT dated 08/30/2013  FINDINGS: Stable cardiac silhouette. Atrial appendage clip noted. Interval removal of left chest tube with a trace left apical pneumothorax. Bibasilar atelectasis and effusions are again demonstrated  IMPRESSION: 1. Interval removal of left chest tube with trace left apical pneumothorax. 2. Bibasilar atelectasis and pleural effusions.   Electronically Signed   By: Suzy Bouchard M.D.   On: 09/01/2013 09:30   Dg Chest Port 1 View  08/31/2013   CLINICAL DATA:  Postop cardiac surgery  EXAM: PORTABLE CHEST - 1 VIEW  COMPARISON:  Prior chest x-ray 08/30/2013  FINDINGS: Interval extubation and removal of nasogastric tube. The Swan-Ganz catheter has been removed although the right IJ approach vascular sheath remains in unchanged  position. Is the mediastinal drain has been removed. The left thoracostomy tube remains in position. Patient is status post median sternotomy with evidence of multivessel CABG and a left atrial appendage occlusion. Stable cardiac and mediastinal contours. Atherosclerosis noted in the transverse aorta. Slightly increased bilateral pleural effusions and associated bibasilar atelectasis. Mild vascular congestion without overt edema. No pneumothorax. .  IMPRESSION: 1. Interval extubation, removal of nasogastric tube, Swan-Ganz catheter and mediastinal drain. Right IJ vascular sheath and left chest tube remain in stable position. 2. Slightly enlarged right greater than left pleural effusions with associated bibasilar atelectasis. 3. Stable mild vascular congestion without overt edema.   Electronically Signed   By: Jacqulynn Cadet M.D.   On: 08/31/2013 07:52   Portable Chest Xray In Am  08/30/2013   CLINICAL DATA:  Evaluate endotracheal tube position  EXAM: PORTABLE CHEST - 1 VIEW  COMPARISON:  Portable chest x-ray of 08/29/2013  FINDINGS: The tip of the endotracheal tube is at least 2.2 cm above the chronic good position. Haziness at the lung bases is consistent with effusions right greater than left and basilar atelectasis. There is cardiomegaly present with perhaps minimal pulmonary vascular congestion noted. A Swan-Ganz catheter is unchanged and a left chest tube remains. There may be a very tiny left apical pneumothorax present  IMPRESSION: 1. The endotracheal tube tip at least 2.2 cm above the chronic good position. 2. Cardiomegaly, probable small effusions right greater than left and mild pulmonary vascular congestion. 3. Left chest tube with tiny left apical pneumothorax.   Electronically Signed   By: Ivar Drape M.D.   On: 08/30/2013 07:58   Dg Chest Port 1 View  08/29/2013   CLINICAL DATA:  89-year- female intubated. Initial encounter.  EXAM: PORTABLE CHEST - 1 VIEW  COMPARISON:  0632 hr the same day and  earlier.  FINDINGS: Portable AP semi upright view at 1820 hrs.  Endotracheal tube tip in good position between the level the clavicles and carinal. Right IJ approach Swan-Ganz catheter, catheter tip at the level of the main pulmonary artery. Stable left chest tube. Enteric tube courses to the abdomen, tip not included.  Sequelae of CABG. No pneumothorax identified. Stable cardiac size and mediastinal contours. Improved bibasilar ventilation with small pleural effusions. No overt edema.  IMPRESSION: 1. Satisfactory placement of lines and tubes.  2. Improved bibasilar ventilation. Small pleural effusions. No pneumothorax.   Electronically Signed   By: Lars Pinks M.D.   On: 08/29/2013 18:54   Dg Chest Portable 1 View In Am  08/29/2013   CLINICAL DATA:  Post CABG  EXAM: PORTABLE CHEST - 1 VIEW  COMPARISON:  Portable exam 0632 hr compared to 08/28/2013  FINDINGS: Tip of endotracheal tube projects 3.7 cm above carinal.  Tip Swan-Ganz catheter projects over main pulmonary artery.  Nasogastric tube extends into stomach.  Mediastinal drain and thoracostomy tube noted.  Enlargement of cardiac silhouette post CABG.  Left atrial appendage occlusion device noted.  Atherosclerotic calcification aorta.  Minimal pulmonary edema with subsegmental atelectasis at left base.  No pneumothorax.  Bones demineralized.  IMPRESSION: Stable postoperative changes as above.   Electronically Signed   By: Lavonia Dana M.D.   On: 08/29/2013 08:03   Dg Chest Portable 1 View  08/28/2013   CLINICAL DATA:  Post CABG  EXAM: PORTABLE CHEST - 1 VIEW  COMPARISON:  08/27/2013  FINDINGS: Ppm status post CABG. NG tube crosses into the stomach with side hole above the level of the diaphragm. Left chest tube with no pneumothorax. Swan-Ganz central line with tip in the main pulmonary artery. Endotracheal tube with tip about 8 mm above the carinal. Mild mid to lower lung zone atelectasis on the left. No significant consolidation, effusion, or edema.   IMPRESSION: Acceptable postoperative appearance. Tip of endotracheal tube the only about 8 mm above the carinal. See above description of NG tube as well.   Electronically Signed   By: Skipper Cliche M.D.   On: 08/28/2013 12:52      ASSESSMENT: Status post CABG  PLAN:  Orthopnea, likely related to pleural effusions. She has drainage already from the left chest tube site. Will consult CT surgery for further management. They may want to proceed with thoracentesis. Will give IV Lasix today instead of oral Lasix to help with the diuresis. This will also hopefully help her hyponatremia.  Had a long conversation with her and her husband about the option of thoracentesis versus diuretics. We also spoke about the hyponatremia in detail.  Jettie Booze., MD  09/12/2013  11:57 AM

## 2013-09-12 NOTE — Plan of Care (Signed)
Problem: Phase I Progression Outcomes Goal: EF % per last Echo/documented,Core Reminder form on chart Outcome: Completed/Met Date Met:  09/12/13 EF = 60-65% from 08/21/13 2D echo

## 2013-09-12 NOTE — Progress Notes (Signed)
UR completed 

## 2013-09-12 NOTE — H&P (Signed)
History and Physical  Patient ID: Maria [name] MRN: [id number], SOB: [birth year] 79 y.o. Date of Encounter: 09/12/2013, 2:29 AM  Primary Physician: Mathews Argyle, MD Primary Cardiologist: Dr. Linard Millers  Chief Complaint: shortness of breath  HPI: 79 y.o. female w/ PMHx significant for CAD s/p recent CABG, PVD, paroxysmal afib, who presented to [facility] on 09/12/2013 with complaints of significant shortness of breath and orthopnea. Pt was recently discharged on 11/14 after undergoing CABG. Course was complicated by re-intubation and afib with RVR.   She arrived at rehab on Saturday and reports doing okay until Monday when she felt more dyspneic and significantly worse when she laid down. Would become very anxious with laying down and would feel a "band" across her chest making breathing difficult. Is also confused by multiple instructions from PT and nursing at the rehab facility regarding what she is and is not allowed to do.  Currently she is sitting in bed without complaints.  EKG revealed sinus brady with TW flattening inferiorly. CXR with bil effusions, ?L increasing . Labs are significant for abnormal POC trop but nl lab trop. Hyponatremia.   Past Medical History  Diagnosis Date  . Osteoporosis   . Celiac disease   . Celiac disease   . Carotid artery occlusion     right carotid stenosis 40-60%, 4/08, neg for stenosis repeat study 11/11  . Depression   . Presbycusis   . Macular degeneration   . Urethral stricture   . Moderate aortic insufficiency   . Moderate mitral regurgitation by prior echocardiogram     echo 9/13   . Thyroid nodule     743m, no change 11/11  . RLS (restless legs syndrome)   . Vitamin D deficiency   . Heart murmur   . Pulmonary hypertension   . Hypertension     takes Metoprolol daily  . Atrial fibrillation   . Coronary artery disease   . CHF (congestive heart failure)     takes Lasix daily  . MVP (mitral valve prolapse)   .  Moderate obstructive sleep apnea     uses CPAP;sleep study about 4-577monthago  . Hearing loss     both ears  . Numbness     left arm  . Joint swelling   . GERD (gastroesophageal reflux disease)     takes Omeprazole daily  . Gastric ulcer     [id number]  . GI bleeding   . Constipation   . Diarrhea   . Hemorrhoids   . Urinary retention   . Urinary anastomotic stricture   . Hypothyroidism     takes Synthroid daily as result of AMiodarone  . Hot flashes     takes ZOloft 3 times a week  . Insomnia     takes Melatonin nightly  . Restless leg   . Peripheral edema     left     Surgical History:  Past Surgical History  Procedure Laterality Date  . Appendectomy    . Tubal ligation    . Tonsillectomy    . Trigger thumb    . Cardiac catheterization  08-06-13  . Tcs    . Bilateral cataract surgery    . Mandible surgery    . Coronary artery bypass graft N/A 08/28/2013    Procedure: CORONARY ARTERY BYPASS GRAFTING (CABG) x2 using right greater saphenous vein and left internal mammary artery. ;  Surgeon: EdwGrace IsaacD;  [facility];  Laterality: N/A;  . Intraoperative transesophageal echocardiogram N/A 08/28/2013    Procedure: INTRAOPERATIVE TRANSESOPHAGEAL ECHOCARDIOGRAM;  Surgeon: Grace Isaac, MD;  Location: [facility];  Service: Open Heart Surgery;  Laterality: N/A;     Home Meds: Prior to Admission medications   Medication Sig Start Date End Date Taking? Authorizing Provider  amiodarone (PACERONE) 200 MG tablet Take 200 mg by mouth 2 (two) times daily.    Yes Historical Provider, MD  aspirin EC 325 MG EC tablet Take 1 tablet (325 mg total) by mouth daily. 09/08/13  Yes Wayne E Gold, PA-C  calcium-vitamin D (OSCAL WITH D) 500-200 MG-UNIT per tablet Take 1 tablet by mouth daily.   Yes Historical Provider, MD  cholecalciferol (VITAMIN D) 400 UNITS TABS tablet Take 400 Units by mouth daily.   Yes Historical Provider, MD  furosemide  (LASIX) 40 MG tablet Take 1 tablet (40 mg total) by mouth daily. 09/08/13  Yes Wayne E Gold, PA-C  levothyroxine (SYNTHROID, LEVOTHROID) 50 MCG tablet Take 50 mcg by mouth daily before breakfast.   Yes Historical Provider, MD  magnesium oxide (MAG-OX) 400 MG tablet Take 400 mg by mouth daily.   Yes Historical Provider, MD  Melatonin 3 MG TABS Take 3 mg by mouth at bedtime.    Yes Historical Provider, MD  metoprolol (LOPRESSOR) 50 MG tablet Take 1 tablet (50 mg total) by mouth 2 (two) times daily. 09/08/13  Yes Wayne E Gold, PA-C  omeprazole (PRILOSEC) 20 MG capsule Take 20 mg by mouth daily.   Yes Historical Provider, MD  potassium chloride SA (K-DUR,KLOR-CON) 20 MEQ tablet Take 1 tablet (20 mEq total) by mouth daily. 09/08/13  Yes Wayne E Gold, PA-C  sertraline (ZOLOFT) 50 MG tablet Take 50 mg by mouth 3 (three) times a week. Tues, Thurs and Saturday   Yes Historical Provider, MD  feeding supplement, ENSURE COMPLETE, (ENSURE COMPLETE) LIQD Take 237 mLs by mouth 2 (two) times daily between meals. 09/08/13   John Giovanni, PA-C    Allergies:  Allergies  Allergen Reactions  . Amoxicillin Other (See Comments)    Nausea, dizziness  . Codeine Nausea And Vomiting  . Fosamax [Alendronate Sodium] Other (See Comments)    Jaw pain  . Gluten Meal Other (See Comments)    Celiac Disease  . Hctz [Hydrochlorothiazide] Other (See Comments)    Hyponatremia, GI upset  . Tetanus Toxoids Other (See Comments)    Bad local reaction    History   Social History  . Marital Status: Married    Spouse Name: N/A    Number of Children: N/A  . Years of Education: N/A   Occupational History  . Not on file.   Social History Main Topics  . Smoking status: Never Smoker   . Smokeless tobacco: Not on file  . Alcohol Use: No  . Drug Use: No  . Sexual Activity: Yes    Birth Control/ Protection: Surgical   Other Topics Concern  . Not on file   Social History Narrative  . No narrative on file     Family  History  Problem Relation Age of Onset  . Heart attack Mother   . CVA Mother   . CAD Father   . Colon cancer Father   . Heart attack Father   . Heart attack Brother   . Peripheral vascular disease Brother   . AAA (abdominal aortic aneurysm) Brother     Review of Systems: General: negative for chills, fever, night sweats or  weight changes.  Cardiovascular: see HPI Dermatological: negative for rash Respiratory: negative for cough or wheezing, +DOE Urologic: negative for hematuria Abdominal: negative for nausea, vomiting, diarrhea, bright red blood per rectum, melena, or hematemesis Neurologic: negative for visual changes, syncope, or dizziness All other systems reviewed and are otherwise negative except as noted above.  Labs:   Lab Results  Component Value Date   WBC 8.8 09/12/2013   HGB 11.6* 09/12/2013   HCT 33.6* 09/12/2013   MCV 91.3 09/12/2013   PLT 426* 09/12/2013    Recent Labs Lab 09/12/13 0011  NA 127*  K 3.8  CL 93*  CO2 24  BUN 10  CREATININE 0.74  CALCIUM 8.7  GLUCOSE 107*    Recent Labs  09/12/13 0015  TROPONINI <0.30   No results found for this basename: CHOL, HDL, LDLCALC, TRIG   No results found for this basename: DDIMER    Radiology/Studies:  Dg Chest 2 View  09/05/2013   CLINICAL DATA:  Dyspnea with history of atrial fibrillation.  EXAM: CHEST  2 VIEW  COMPARISON:  September 03, 2013.  FINDINGS: The lungs are well expanded. No left apical pneumothorax is demonstrated. There is persistent blunting of the costophrenic angles consistent with pleural effusions, but this has slightly improved since the earlier study. The cardiac silhouette remains mildly enlarged. A left atrial appendage clip is present and unchanged. The patient has undergone previous median sternotomy and CABG. The central pulmonary vascularity is prominent. The pulmonary interstitial markings are also mildly prominent especially in the mid and lower lungs.  IMPRESSION: There has  been slight interval improvement in the appearance of the chest especially of the bilateral pleural effusions. Improved aeration of both lungs has occurred. There is no evidence of a recurrent pneumothorax.   Electronically Signed   By: David  Martinique   On: 09/05/2013 09:46   Portable cxray 11/19 IMPRESSION:  Increasing moderate left, similar small a moderate right pleural  effusions with bibasilar airspace opacities which may reflect  confluent edema/atelectasis.     EKG: sinus brady ~55, TWI inferiorly, RBBB  Physical Exam: Blood pressure 147/75, pulse 55, temperature 97.7 F (36.5 C), temperature source Oral, resp. rate 16, SpO2 96.00%. General: Well developed, well nourished, in no acute distress. Head: Normocephalic, atraumatic, sclera non-icteric, nares are without discharge Neck: Supple. Negative for carotid bruits. JVD not elevated. Lungs: BS absent bilateral bases Heart: RRR with S1 S2. No murmurs, rubs, or gallops appreciated., sternal wound well healed Abdomen: Soft, non-tender, non-distended with normoactive bowel sounds. No rebound/guarding. No obvious abdominal masses. Msk:  Strength and tone appear normal for age. Extremities: +1 Edema right leg (vein sites c/d/i), trace left No clubbing or cyanosis. Distal pedal pulses are 2+ and equal bilaterally. Neuro: Alert and oriented X 3. Moves all extremities spontaneously. Psych:  Responds to questions appropriately with a normal affect.   Problem List 1. Bilateral effusions s/p CABG 2. HTN 3. Known CAD 4. Paroxysmal afib, currently in sinus but brady 5. Hyponatremia 6. Anemia s/p recent surgery 7. Anxiety  ASSESSMENT AND PLAN:  79 y.o. female w/ PMHx significant for CAD s/p recent CABG, PVD, paroxysmal afib, who presented to [facility] on 09/12/2013 with complaints of significant shortness of breath and orthopnea.  Cxray suggest possible worsening of the effusion in the left lung field. Exam consistent with  bilateral effusions. No right sided evidence on exam. Surprisingly, BNP is less than at discharge. Will continue diuresis for now and will get proper 2 view  cxray for comparison. Anxiety is also contributing- improved instructions and guidance by PT as well as the surgical team regarding her limitations will be helpful.  Continue amiodarone. Will lower beta blocker slightly as she is rather bradycardic in the 50s on current dose.  Hyponatremia noted, a little below her baseline. Repeat in the AM.  Prophylaxis: lovenox PPI  Pt does not want heroic measures if an unexpected Code occurs.   Signed, Elias Else, Cyndra Feinberg C. MD 09/12/2013, 2:29 AM

## 2013-09-12 NOTE — ED Notes (Addendum)
Rn spoke with Admitting MD- they state cp is not believed to be ischemic heart related and pt is okay to go to telemetry bed.

## 2013-09-13 ENCOUNTER — Inpatient Hospital Stay (HOSPITAL_COMMUNITY): Payer: Medicare Other

## 2013-09-13 DIAGNOSIS — I10 Essential (primary) hypertension: Secondary | ICD-10-CM

## 2013-09-13 DIAGNOSIS — I5032 Chronic diastolic (congestive) heart failure: Secondary | ICD-10-CM

## 2013-09-13 DIAGNOSIS — I2510 Atherosclerotic heart disease of native coronary artery without angina pectoris: Secondary | ICD-10-CM

## 2013-09-13 LAB — OSMOLALITY: Osmolality: 272 mOsm/kg — ABNORMAL LOW (ref 275–300)

## 2013-09-13 MED ORDER — MAGNESIUM HYDROXIDE 400 MG/5ML PO SUSP
15.0000 mL | Freq: Once | ORAL | Status: AC
  Administered 2013-09-13: 21:00:00 15 mL via ORAL
  Filled 2013-09-13: qty 30

## 2013-09-13 MED ORDER — FUROSEMIDE 40 MG PO TABS
40.0000 mg | ORAL_TABLET | Freq: Every day | ORAL | Status: DC
  Filled 2013-09-13: qty 1

## 2013-09-13 NOTE — Procedures (Signed)
Successful US guided left thoracentesis. Yielded 534m of clear yellow fluid. Pt tolerated procedure well. No immediate complications.  Specimen was not sent for labs. CXR ordered.  BAscencion DikePA-C 09/13/2013 10:02 AM

## 2013-09-13 NOTE — Progress Notes (Signed)
       Patient Name: Maria [name] Date of Encounter: 09/13/2013    SUBJECTIVE: I saw her early this AM and again this PM. She is breathing better after thoracentesis but not as much improvement as she hoped. Concerned about her atonic bladder and having to use diuretics.  TELEMETRY:  NSR maintaining stable  Filed Vitals:   09/13/13 0934 09/13/13 0952 09/13/13 1137 09/13/13 1600  BP: 162/55 124/55 126/44 115/71  Pulse:    53  Temp:    97.5 F (36.4 C)  TempSrc:    Oral  Resp:    18  Height:      Weight:      SpO2:    98%    Intake/Output Summary (Last 24 hours) at 09/13/13 1742 Last data filed at 09/13/13 1603  Gross per 24 hour  Intake   1200 ml  Output    615 ml  Net    585 ml    LABS: Basic Metabolic Panel:  Recent Labs  09/12/13 0011 09/12/13 0820  NA 127* 130*  K 3.8 3.8  CL 93* 95*  CO2 24 24  GLUCOSE 107* 118*  BUN 10 9  CREATININE 0.74 0.74  CALCIUM 8.7 8.4   CBC:  Recent Labs  09/12/13 0011 09/12/13 0820  WBC 8.8 8.3  HGB 11.6* 10.7*  HCT 33.6* 32.0*  MCV 91.3 92.2  PLT 426* 389   Cardiac Enzymes:  Recent Labs  09/12/13 0015  TROPONINI <0.30   BNP (last 3 results)  Recent Labs  09/05/13 0810 09/12/13 0011  PROBNP 3947.0* 1314.0*   Radiology/Studies:  500 cc of yellow fluid removed under U/S guidance from left pleural space.  Physical Exam: Blood pressure 115/71, pulse 53, temperature 97.5 F (36.4 C), temperature source Oral, resp. rate 18, height 5' 1.5" (1.562 m), weight 110 lb (49.896 kg), SpO2 98.00%. Weight change: -4 lb 13.8 oz (-2.204 kg)   Decreased bilateral breath sounds.  ASSESSMENT:  1. Acute on chronic diastolic heart failure, improved 2. Bilateral pleural effusions of mixed etiology, including CHF and inflammatory (post op). 3. Elderly and frail 4. Hyponatremia  Plan:  1. Supportive care and rehab 2. Diuresis 3. Check urine and serum Na+ 4. TSH 5. ? Right thoracentesis  tomorrow.   Demetrios Isaacs 09/13/2013, 5:42 PM

## 2013-09-13 NOTE — Progress Notes (Addendum)
Pt c/o not being able to void well, pt says this is a problem that she has dealt with for awhile.  Dr. [name] notified, bladder scan done and showed 211 ml. Urine in bladder.  Pt being taken to have thoracentesis, will continue to monitor.

## 2013-09-13 NOTE — Clinical Social Work Psychosocial (Signed)
     Clinical Social Work Department BRIEF PSYCHOSOCIAL ASSESSMENT 09/13/2013  Patient:  Maria [name], Maria [name]     Account Number:  [id number]     Admit date:  09/11/2013  Clinical Social Worker:  Iona Coach  Date/Time:  09/13/2013 11:31 AM  Referred by:  Physician  Date Referred:  09/12/2013 Referred for  Other - See comment   Other Referral:   from SNF   Interview type:  Other - See comment Other interview type:   Pt, Husband and Daughter    PSYCHOSOCIAL DATA Living Status:  FACILITY Admitted from facility:  [facility] Level of care:  [place] Primary support name:  Loriana Samad [phone] Primary support relationship to patient:  SPOUSE Degree of support available:   Strong    Daughter.Marland KitchenMarland KitchenKermit Balo [phone]    CURRENT CONCERNS Current Concerns  Other - See comment   Other Concerns:   return to SNF    [place] / PLAN Social work Theatre manager met with pt, husband and daughter at bed side. Pt is a current resident at facility/SNF. After rehab plans to return to independent living with husband at [facility]. Pt states she will return and is happy at the facility. FL2 will be placed on chart for MD signature.   Assessment/plan status:  Psychosocial Support/Ongoing Assessment of Needs Other assessment/ plan:   Information/referral to community resources:   none at this time    PATIENTS/FAMILYS RESPONSE TO PLAN OF CARE: pt is alert and oriented. pt and family are very pleasant and plans to return to SNF at discharge. Her husband and daughter are agreeable. Social work Theatre manager will assist with d/c plan when medically stable. Pt and family were very appreciative with assistance with discharge planning. Donnella Sham, [place] Intern    I have have reviewed and agree with above assessment.  Lorie Phenix. [place], [place]

## 2013-09-13 NOTE — Plan of Care (Signed)
Problem: Phase I Progression Outcomes Goal: Other Phase I Outcomes/Goals Outcome: Progressing Pending US guided thoracentesis 09/13/13.  Consent verified.  Will follow up with [place] in am.  No complications this shift.

## 2013-09-13 NOTE — Progress Notes (Addendum)
[address]       [place] [address]             [phone]           Subjective: Some improvement from thoracentesis but procedure was uncomfortable for her.   Objective  Telemetry sinus brady/rhythm  Temp:  [97.5 F (36.4 C)-98 F (36.7 C)] 97.5 F (36.4 C) (11/20 1600) Pulse Rate:  [53-61] 53 (11/20 1600) Resp:  [18] 18 (11/20 1600) BP: (115-167)/(40-71) 115/71 mmHg (11/20 1600) SpO2:  [93 %-98 %] 98 % (11/20 1600) Weight:  [110 lb (49.896 kg)] 110 lb (49.896 kg) (11/20 4451)   Intake/Output Summary (Last 24 hours) at 09/13/13 1628 Last data filed at 09/13/13 1603  Gross per 24 hour  Intake   1200 ml  Output   1140 ml  Net     60 ml       General appearance: alert, cooperative and no distress Heart: regular rate and rhythm Lungs: crackles in right base, mildly dim in left base Wound: incisions healing well  Lab Results:  Recent Labs  09/12/13 0011 09/12/13 0820  NA 127* 130*  K 3.8 3.8  CL 93* 95*  CO2 24 24  GLUCOSE 107* 118*  BUN 10 9  CREATININE 0.74 0.74  CALCIUM 8.7 8.4   No results found for this basename: AST, ALT, ALKPHOS, BILITOT, PROT, ALBUMIN,  in the last 72 hours No results found for this basename: LIPASE, AMYLASE,  in the last 72 hours  Recent Labs  09/12/13 0011 09/12/13 0820  WBC 8.8 8.3  HGB 11.6* 10.7*  HCT 33.6* 32.0*  MCV 91.3 92.2  PLT 426* 389    Recent Labs  09/12/13 0015  TROPONINI <0.30   No components found with this basename: POCBNP,  No results found for this basename: DDIMER,  in the last 72 hours No results found for this basename: HGBA1C,  in the last 72 hours No results found for this basename: CHOL, HDL, LDLCALC, TRIG, CHOLHDL,  in the last 72 hours No results found for this basename: TSH, T4TOTAL, FREET3, T3FREE, THYROIDAB,  in the last 72 hours No results found for this basename: VITAMINB12, FOLATE, FERRITIN, TIBC, IRON, RETICCTPCT,  in the last 72  hours  Medications: Scheduled . amiodarone  200 mg Oral BID  . aspirin EC  325 mg Oral Daily  . calcium-vitamin D  1 tablet Oral Daily  . enoxaparin (LOVENOX) injection  40 mg Subcutaneous Daily  . levothyroxine  50 mcg Oral QAC breakfast  . magnesium oxide  400 mg Oral Daily  . metoprolol  25 mg Oral BID  . pantoprazole  40 mg Oral Daily  . potassium chloride SA  20 mEq Oral Daily  . sertraline  50 mg Oral 3 times weekly     Radiology/Studies:  Dg Chest 1 View  09/13/2013   CLINICAL DATA:  Status post left thoracentesis with weakness.  EXAM: CHEST - 1 VIEW  COMPARISON:  Most recent chest 08/1913.  FINDINGS: Marked improvement left pleural effusion. There is no pneumothorax. Right effusion persists. Cardiomegaly with vascular calcification unchanged. Prior CABG.  IMPRESSION: Marked improvement left pleural effusion.   Electronically Signed   By: Rolla Flatten M.D.   On: 09/13/2013 10:12   Dg Chest 2 View  09/12/2013   CLINICAL DATA:  Chest pain.  EXAM: CHEST  2 VIEW  COMPARISON:  09/12/2013.  FINDINGS: The cardiac silhouette, mediastinal and hilar contours are stable.  Stable tortuosity and calcification of the thoracic aorta. A left atrial appendage occlusion device is noted. There are moderate size bilateral pleural effusions, left greater than right with overlying atelectasis. No pulmonary edema or pneumothorax.  IMPRESSION: Persistent bilateral pleural effusions and overlying atelectasis.   Electronically Signed   By: Kalman Jewels M.D.   On: 09/12/2013 07:54   Dg Chest Port 1 View  09/12/2013   CLINICAL DATA:  Shortness of breath, recent CABG.  EXAM: PORTABLE CHEST - 1 VIEW  COMPARISON:  Chest radiograph September 05, 2013  FINDINGS: Cardiac silhouette remains moderately enlarged, status post coronary artery bypass grafting and median sternotomy. Mildly calcified aortic knob man tortuous aorta.  Increasing bibasilar airspace opacities with moderate left, small to moderate right pleural  effusion, increased on the left. No pneumothorax.  Multiple EKG lines overlie the patient and may obscure subtle underlying pathology. Patient is osteopenic.  IMPRESSION: Increasing moderate left, similar small a moderate right pleural effusions with bibasilar airspace opacities which may reflect confluent edema/atelectasis.  Stable cardiomegaly, status post CABG.   Electronically Signed   By: Elon Alas   On: 09/12/2013 01:10   US Thoracentesis Asp Pleural Space W/img Guide  09/13/2013   CLINICAL DATA:  Post open heart surgery, left-sided pleural effusion.  EXAM: ULTRASOUND GUIDED left THORACENTESIS  COMPARISON:  None.  FINDINGS: A total of approximately 500 mL of clear yellow fluid was removed. A fluid sample was notsent for laboratory analysis.  Similar sized pleural effusion is noted on limited ultrasound of the right chest.  IMPRESSION: Successful ultrasound guided left thoracentesis yielding 500 mL of pleural fluid.  Read by: Ascencion Dike PA-C  PROCEDURE: An ultrasound guided thoracentesis was thoroughly discussed with the patient and questions answered. The benefits, risks, alternatives and complications were also discussed. The patient understands and wishes to proceed with the procedure. Written consent was obtained.  Ultrasound was performed to localize and mark an adequate pocket of fluid in the left chest. The area was then prepped and draped in the normal sterile fashion. 1% Lidocaine was used for local anesthesia. Under ultrasound guidance a 19 gauge Yueh catheter was introduced. Thoracentesis was performed. The catheter was removed and a dressing applied.  Complications:  None immediate   Electronically Signed   By: Jacqulynn Cadet M.D.   On: 09/13/2013 10:08    INR: Will add last result for INR, ABG once components are confirmed Will add last 4 CBG results once components are confirmed  Assessment/Plan: S/P left thoracentesis, will order right side for tomorrow.  Other clinical  management per cardiolody She appears very stable   LOS: 2 days    GOLD,WAYNE E 11/20/20144:28 PM  I have seen and examined [name] and agree with the above assessment  and plan.  Grace Isaac MD Beeper [phone] Office [phone] 09/13/2013 7:03 PM

## 2013-09-13 NOTE — Progress Notes (Signed)
CARDIAC REHAB PHASE I   PRE:  Rate/Rhythm: 85 1HB SB  BP:  Supine:   Sitting: 120/60  Standing:    SaO2: 97%RA  MODE:  Ambulation: 200 ft   POST:  Rate/Rhythm: 58  BP:  Supine:   Sitting: 140/60  Standing:    SaO2: 94%RA 1450-1517 Pt walked 200 ft with gait belt,rollator and asst x 2. Pt buckled knees once and attributed it to a big pain that hit her. Said gait belt too high so we lower it for comfort. To recliner after walk. Family wants to walk later. Told them she needs 2 people as knees buckled once with [place].   Graylon Good, RN BSN  09/13/2013 3:11 PM

## 2013-09-14 ENCOUNTER — Inpatient Hospital Stay (HOSPITAL_COMMUNITY): Payer: Medicare Other

## 2013-09-14 DIAGNOSIS — Z951 Presence of aortocoronary bypass graft: Secondary | ICD-10-CM

## 2013-09-14 DIAGNOSIS — I4891 Unspecified atrial fibrillation: Secondary | ICD-10-CM

## 2013-09-14 DIAGNOSIS — Z79899 Other long term (current) drug therapy: Secondary | ICD-10-CM

## 2013-09-14 LAB — BASIC METABOLIC PANEL
BUN: 9 mg/dL (ref 6–23)
CO2: 28 mEq/L (ref 19–32)
Chloride: 98 mEq/L (ref 96–112)
Creatinine, Ser: 0.73 mg/dL (ref 0.50–1.10)
GFR calc non Af Amer: 75 mL/min — ABNORMAL LOW (ref >90)
Glucose, Bld: 141 mg/dL — ABNORMAL HIGH (ref 70–99)
Sodium: 133 mEq/L — ABNORMAL LOW (ref 135–145)

## 2013-09-14 MED ORDER — FUROSEMIDE 40 MG PO TABS
40.0000 mg | ORAL_TABLET | Freq: Every day | ORAL | Status: AC
  Administered 2013-09-14: 40 mg via ORAL
  Filled 2013-09-14: qty 1

## 2013-09-14 MED ORDER — SPIRONOLACTONE 25 MG PO TABS
25.0000 mg | ORAL_TABLET | Freq: Every day | ORAL | Status: DC
  Administered 2013-09-15: 25 mg via ORAL
  Filled 2013-09-14: qty 1

## 2013-09-14 MED ORDER — POTASSIUM CHLORIDE CRYS ER 20 MEQ PO TBCR
20.0000 meq | EXTENDED_RELEASE_TABLET | Freq: Every day | ORAL | Status: AC
  Administered 2013-09-14: 20 meq via ORAL

## 2013-09-14 MED ORDER — AMIODARONE HCL 200 MG PO TABS
200.0000 mg | ORAL_TABLET | Freq: Every day | ORAL | Status: DC
  Administered 2013-09-14 – 2013-09-15 (×2): 200 mg via ORAL
  Filled 2013-09-14 (×2): qty 1

## 2013-09-14 NOTE — Progress Notes (Signed)
CARDIAC REHAB PHASE I   PRE:  Rate/Rhythm: 60 SB  BP:  Supine:   Sitting: 104/50  Standing:    SaO2: 95%RA  MODE:  Ambulation: 400 ft   POST:  Rate/Rhythm: 56  BP:  Supine:   Sitting: 140/58  Standing:    SaO2: 95%RA 1322-1349 Pt assisted to bathroom and then walked 400 ft on RA with rollator and asst x 2. Gait belt use. Pt sat once to rest at 200 ft. Tolerated well. Stronger today. To recliner with call bell. Daughter in room.   Graylon Good, RN BSN  09/14/2013 1:44 PM

## 2013-09-14 NOTE — Progress Notes (Signed)
Per MD note- possible d/c back to [facility] tomorrow if medically stable.  CSW spoke to [facility]- Admissions at [facility]- will accept patient back Saturday or Sunday if indicated by MD.  Weekend CSW will need to fax d/c summary to [phone] and call their nursing supervisor.  CSW met with patient and husband who were in agreement with above plan. Husband will transport via car once d/c is in place.  FL-2 on front of chart- needing MD's signature for return to SNF.  Thanks!  Lorie Phenix. [place], [place]

## 2013-09-14 NOTE — Procedures (Signed)
Successful US guided right thoracentesis. Yielded 742m of clear yellow fluid. Pt tolerated procedure well. No immediate complications.  Specimen was not sent for labs. CXR ordered.  BAscencion DikePA-C 09/14/2013 10:25 AM

## 2013-09-14 NOTE — Progress Notes (Signed)
[address]       [place] [address]             [phone]                      LOS: 3 days   Subjective: Feels better, up in chair eating dinner  Objective: Vital signs in last 24 hours: Patient Vitals for the past 24 hrs:  BP Temp Temp src Pulse Resp SpO2 Weight  09/14/13 1452 92/52 mmHg 97.9 F (36.6 C) Oral 53 17 95 % -  09/14/13 1057 123/46 mmHg - - 51 - - -  09/14/13 1023 105/76 mmHg - - - - - -  09/14/13 1020 118/26 mmHg - - - - - -  09/14/13 1016 110/38 mmHg - - - - - -  09/14/13 1010 157/74 mmHg - - - - - -  09/14/13 0500 159/56 mmHg 97.6 F (36.4 C) Oral 54 18 97 % 112 lb 4.8 oz (50.939 kg)  09/13/13 2107 - - - 60 - - -    Filed Weights   09/12/13 0415 09/13/13 0624 09/14/13 0500  Weight: 114 lb 13.8 oz (52.1 kg) 110 lb (49.896 kg) 112 lb 4.8 oz (50.939 kg)    Hemodynamic parameters for last 24 hours:    Intake/Output from previous day: 11/20 0701 - 11/21 0700 In: 980 [P.O.:980] Out: 550 [Urine:550] Intake/Output this shift: Total I/O In: 120 [P.O.:120] Out: 1050 [Urine:1050]  Scheduled Meds: . amiodarone  200 mg Oral Daily  . aspirin EC  325 mg Oral Daily  . calcium-vitamin D  1 tablet Oral Daily  . enoxaparin (LOVENOX) injection  40 mg Subcutaneous Daily  . levothyroxine  50 mcg Oral QAC breakfast  . magnesium oxide  400 mg Oral Daily  . metoprolol  25 mg Oral BID  . pantoprazole  40 mg Oral Daily  . sertraline  50 mg Oral 3 times weekly  . [START ON 09/15/2013] spironolactone  25 mg Oral Daily   Continuous Infusions:  PRN Meds:.acetaminophen  General appearance: alert and cooperative Neurologic: intact Heart: regular rate and rhythm, S1, S2 normal, no murmur, click, rub or gallop Lungs: clear to auscultation bilaterally Abdomen: soft, non-tender; bowel sounds normal; no masses,  no organomegaly Extremities: extremities normal, atraumatic, no cyanosis or edema and Homans sign is negative, no sign of DVT Wound:  sternal incision well healed   Lab Results: CBC: Recent Labs  09/12/13 0011 09/12/13 0820  WBC 8.8 8.3  HGB 11.6* 10.7*  HCT 33.6* 32.0*  PLT 426* 389   BMET:  Recent Labs  09/12/13 0820 09/14/13 0435  NA 130* 133*  K 3.8 SLIGHT HEMOLYSIS  CL 95* 98  CO2 24 28  GLUCOSE 118* 141*  BUN 9 9  CREATININE 0.74 0.73  CALCIUM 8.4 8.3*    PT/INR: No results found for this basename: LABPROT, INR,  in the last 72 hours   Radiology Dg Chest 1 View  09/14/2013   CLINICAL DATA:  Status post thoracentesis on the right. Cough, chest pain and shortness of Breath.  EXAM: CHEST - 1 VIEW  COMPARISON:  08/30/2013  FINDINGS: No pneumothorax. There are small pleural effusions, larger on the left. Lung base opacity is likely due to atelectasis. The lungs are hyperexpanded but otherwise clear.  Changes from C ABG surgery are stable. Left atrial occluding device is stable. The aorta is tortuous. No mediastinal or hilar masses.  IMPRESSION: No pneumothorax  following thoracentesis. Small amount of residual right pleural effusion. Small left pleural effusion. Lung base atelectasis. No infiltrate or edema.   Electronically Signed   By: [name] M.D.   On: 09/14/2013 10:56   Dg Chest 1 View  09/13/2013   CLINICAL DATA:  Status post left thoracentesis with weakness.  EXAM: CHEST - 1 VIEW  COMPARISON:  Most recent chest 08/1913.  FINDINGS: Marked improvement left pleural effusion. There is no pneumothorax. Right effusion persists. Cardiomegaly with vascular calcification unchanged. Prior CABG.  IMPRESSION: Marked improvement left pleural effusion.   Electronically Signed   By: Rolla Flatten M.D.   On: 09/13/2013 10:12   US Thoracentesis Asp Pleural Space W/img Guide  09/14/2013   CLINICAL DATA:  Postoperative right pleural effusion.  EXAM: ULTRASOUND GUIDED right THORACENTESIS  COMPARISON:  Left-sided thoracentesis done yesterday.  FINDINGS: A total of approximately 750 mL of clear yellow fluid was  removed. A fluid sample was notsent for laboratory analysis.  IMPRESSION: Successful ultrasound guided right thoracentesis yielding 750 mL of pleural fluid.  Read by: Ascencion Dike PA-C  PROCEDURE: An ultrasound guided thoracentesis was thoroughly discussed with the patient and questions answered. The benefits, risks, alternatives and complications were also discussed. The patient understands and wishes to proceed with the procedure. Written consent was obtained.  Ultrasound was performed to localize and mark an adequate pocket of fluid in the right chest. The area was then prepped and draped in the normal sterile fashion. 1% Lidocaine was used for local anesthesia. Under ultrasound guidance a 19 gauge Yueh catheter was introduced. Thoracentesis was performed. The catheter was removed and a dressing applied.  Complications:  None immediate   Electronically Signed   By: Daryll Brod M.D.   On: 09/14/2013 10:44   US Thoracentesis Asp Pleural Space W/img Guide  09/13/2013   CLINICAL DATA:  Post open heart surgery, left-sided pleural effusion.  EXAM: ULTRASOUND GUIDED left THORACENTESIS  COMPARISON:  None.  FINDINGS: A total of approximately 500 mL of clear yellow fluid was removed. A fluid sample was notsent for laboratory analysis.  Similar sized pleural effusion is noted on limited ultrasound of the right chest.  IMPRESSION: Successful ultrasound guided left thoracentesis yielding 500 mL of pleural fluid.  Read by: Ascencion Dike PA-C  PROCEDURE: An ultrasound guided thoracentesis was thoroughly discussed with the patient and questions answered. The benefits, risks, alternatives and complications were also discussed. The patient understands and wishes to proceed with the procedure. Written consent was obtained.  Ultrasound was performed to localize and mark an adequate pocket of fluid in the left chest. The area was then prepped and draped in the normal sterile fashion. 1% Lidocaine was used for local anesthesia.  Under ultrasound guidance a 19 gauge Yueh catheter was introduced. Thoracentesis was performed. The catheter was removed and a dressing applied.  Complications:  None immediate   Electronically Signed   By: Jacqulynn Cadet M.D.   On: 09/13/2013 10:08     Assessment/Plan: S/P  Thoracentesis, follow up chest xray in am  Grace Isaac MD 09/14/2013 5:28 PM

## 2013-09-14 NOTE — Progress Notes (Addendum)
       Patient Name: Maria [name] Date of Encounter: 09/14/2013    SUBJECTIVE: Many complaints of discomfort and anxiety related concerns. Overall, admitted she feels better.  TELEMETRY:  Marked sinus brady. Filed Vitals:   09/13/13 1137 09/13/13 1600 09/13/13 2107 09/14/13 0500  BP: 126/44 115/71  159/56  Pulse:  53 60 54  Temp:  97.5 F (36.4 C)  97.6 F (36.4 C)  TempSrc:  Oral  Oral  Resp:  18  18  Height:      Weight:    112 lb 4.8 oz (50.939 kg)  SpO2:  98%  97%    Intake/Output Summary (Last 24 hours) at 09/14/13 0744 Last data filed at 09/14/13 0655  Gross per 24 hour  Intake    980 ml  Output    550 ml  Net    430 ml    LABS: Basic Metabolic Panel:  Recent Labs  09/12/13 0820 09/14/13 0435  NA 130* 133*  K 3.8 SLIGHT HEMOLYSIS  CL 95* 98  CO2 24 28  GLUCOSE 118* 141*  BUN 9 9  CREATININE 0.74 0.73  CALCIUM 8.4 8.3*   CBC:  Recent Labs  09/12/13 0011 09/12/13 0820  WBC 8.8 8.3  HGB 11.6* 10.7*  HCT 33.6* 32.0*  MCV 91.3 92.2  PLT 426* 389   Cardiac Enzymes:  Recent Labs  09/12/13 0015  TROPONINI <0.30   BNP    Component Value Date/Time   PROBNP 1314.0* 09/12/2013 0011     Radiology/Studies:  No new.  Physical Exam: Blood pressure 159/56, pulse 54, temperature 97.6 F (36.4 C), temperature source Oral, resp. rate 18, height 5' 1.5" (1.562 m), weight 112 lb 4.8 oz (50.939 kg), SpO2 97.00%. Weight change: 2 lb 4.8 oz (1.043 kg)   Decreased basilar breath sounds. 1-1/1 systolic murmur. No edema  ASSESSMENT:  1. Acute on chronic diastolic heart failure. 2. Hyponatremia, improved 3. Bilateral pleural effusions 4. Atrial fib with no recurrence. 5. Elderly, frail, and malnourished 6. CAD stable with no angina   Plan:  1. Increase caloric intake 2. Lasix today and check Na+ tomorrow. I'm thinking about aldactone 25 mg daily at discharge rather than furosemide. 3. Home in AM if stable labs and no complications  from today's thoracentesis 4. Increase rehab efforts. 5 Decrease amiodarone to 200 mg daily.  Demetrios Isaacs 09/14/2013, 7:44 AM

## 2013-09-15 ENCOUNTER — Inpatient Hospital Stay (HOSPITAL_COMMUNITY): Payer: Medicare Other

## 2013-09-15 DIAGNOSIS — Z48812 Encounter for surgical aftercare following surgery on the circulatory system: Secondary | ICD-10-CM | POA: Diagnosis not present

## 2013-09-15 DIAGNOSIS — J9819 Other pulmonary collapse: Secondary | ICD-10-CM | POA: Diagnosis not present

## 2013-09-15 DIAGNOSIS — R488 Other symbolic dysfunctions: Secondary | ICD-10-CM | POA: Diagnosis not present

## 2013-09-15 DIAGNOSIS — E039 Hypothyroidism, unspecified: Secondary | ICD-10-CM | POA: Diagnosis not present

## 2013-09-15 DIAGNOSIS — I5032 Chronic diastolic (congestive) heart failure: Secondary | ICD-10-CM | POA: Diagnosis not present

## 2013-09-15 DIAGNOSIS — Z951 Presence of aortocoronary bypass graft: Secondary | ICD-10-CM | POA: Diagnosis not present

## 2013-09-15 DIAGNOSIS — R262 Difficulty in walking, not elsewhere classified: Secondary | ICD-10-CM | POA: Diagnosis not present

## 2013-09-15 DIAGNOSIS — M6281 Muscle weakness (generalized): Secondary | ICD-10-CM | POA: Diagnosis not present

## 2013-09-15 DIAGNOSIS — Z79899 Other long term (current) drug therapy: Secondary | ICD-10-CM | POA: Diagnosis not present

## 2013-09-15 DIAGNOSIS — Z5189 Encounter for other specified aftercare: Secondary | ICD-10-CM | POA: Diagnosis not present

## 2013-09-15 DIAGNOSIS — G4700 Insomnia, unspecified: Secondary | ICD-10-CM | POA: Diagnosis not present

## 2013-09-15 DIAGNOSIS — I5033 Acute on chronic diastolic (congestive) heart failure: Principal | ICD-10-CM

## 2013-09-15 DIAGNOSIS — I10 Essential (primary) hypertension: Secondary | ICD-10-CM | POA: Diagnosis not present

## 2013-09-15 DIAGNOSIS — J90 Pleural effusion, not elsewhere classified: Secondary | ICD-10-CM | POA: Diagnosis not present

## 2013-09-15 DIAGNOSIS — I2510 Atherosclerotic heart disease of native coronary artery without angina pectoris: Secondary | ICD-10-CM | POA: Diagnosis not present

## 2013-09-15 DIAGNOSIS — E441 Mild protein-calorie malnutrition: Secondary | ICD-10-CM

## 2013-09-15 DIAGNOSIS — I4891 Unspecified atrial fibrillation: Secondary | ICD-10-CM | POA: Diagnosis not present

## 2013-09-15 MED ORDER — METOPROLOL SUCCINATE ER 50 MG PO TB24
50.0000 mg | ORAL_TABLET | Freq: Every day | ORAL | Status: DC
  Administered 2013-09-15: 12:00:00 50 mg via ORAL
  Filled 2013-09-15: qty 1

## 2013-09-15 MED ORDER — METOPROLOL SUCCINATE ER 50 MG PO TB24: 50.0000 mg | ORAL_TABLET | Freq: Every day | ORAL | Status: DC

## 2013-09-15 MED ORDER — AMIODARONE HCL 200 MG PO TABS: 200.0000 mg | ORAL_TABLET | Freq: Every day | ORAL | Status: DC

## 2013-09-15 MED ORDER — SPIRONOLACTONE 25 MG PO TABS: 25.0000 mg | ORAL_TABLET | Freq: Every day | ORAL | Status: DC

## 2013-09-15 NOTE — Progress Notes (Signed)
Wife and husband verbalized understanding of discharge instructions given by Tai RN.  IV removed and telemetry monitor.  Client had patch to right eye applied by husband prior to relaase.  Transported via wheelchair to discharge area by student.

## 2013-09-15 NOTE — Progress Notes (Addendum)
      [address]       [place] [address]             [phone]      Subjective:  Ms. Horkey states she is feeling much better.  She is anxious to get discharged.  Objective: Vital signs in last 24 hours: Temp:  [97.6 F (36.4 C)-98 F (36.7 C)] 97.6 F (36.4 C) (11/22 0529) Pulse Rate:  [51-60] 55 (11/22 0529) Cardiac Rhythm:  [-] Heart block (11/21 2033) Resp:  [17-18] 18 (11/22 0529) BP: (92-157)/(26-76) 139/55 mmHg (11/22 0529) SpO2:  [95 %-99 %] 95 % (11/22 0529) Weight:  [107 lb (48.535 kg)] 107 lb (48.535 kg) (11/22 0529)  Intake/Output from previous day: 11/21 0701 - 11/22 0700 In: 356 [P.O.:356] Out: 1350 [Urine:1350]  General appearance: alert, cooperative and no distress Heart: regular rate and rhythm Lungs: clear to auscultation bilaterally Abdomen: soft, non-tender; bowel sounds normal; no masses,  no organomegaly Wound: clean and dry  Lab Results: No results found for this basename: WBC, HGB, HCT, PLT,  in the last 72 hours BMET:  Recent Labs  09/14/13 0435  NA 133*  K SLIGHT HEMOLYSIS  CL 98  CO2 28  GLUCOSE 141*  BUN 9  CREATININE 0.73  CALCIUM 8.3*    PT/INR: No results found for this basename: LABPROT, INR,  in the last 72 hours ABG    Component Value Date/Time   PHART 7.484* 08/30/2013 0335   HCO3 24.3* 08/30/2013 0335   TCO2 25.4 08/30/2013 0335   ACIDBASEDEF 5.0* 08/29/2013 1941   O2SAT 95.5 08/30/2013 0335   CBG (last 3)  No results found for this basename: GLUCAP,  in the last 72 hours  Assessment/Plan:  1. S/P Thoracentesis for bilateral pleural effusions- CXR looks stable 2. Dispo- patient okay to discharge per TCTS, patient to follow up with Dr. Servando Snare on 09/27/2013  LOS: 4 days    BARRETT, Junie Panning 09/15/2013  I have seen and examined [name] and agree with the above assessment  and plan.  Grace Isaac MD Beeper [phone] Office [phone] 09/15/2013 11:30 AM

## 2013-09-15 NOTE — Discharge Summary (Signed)
Physician Discharge Summary  Patient ID: Maria [name] MRN: [id number] DOB/AGE: 05/01/1924 79 y.o.  Admit date: 09/11/2013 Discharge date: 09/15/2013  Admission Diagnoses: Acute on chronic diastolic heart failure with bilateral pleural effusions  Discharge Diagnoses:   - Acute on chronic diastolic heart failure - Bilateral pleural effusion - Mild protein malnutrition - Hyponatremia - Coronary artery disease status post recent bypass-Dr. Servando Snare - Peripheral vascular disease - Paroxysmal atrial fibrillation - Obstructive sleep apnea - Hypertension - Recent right eye cataract surgery - Postop/paroxysmal atrial fibrillation-no recurrence during hospitalization   Discharged Condition: good  Hospital Course: Admitted on 09/12/13 after recent discharge from hospitalization on 09/04/13 postop bypass with shortness of breath and worsening bilateral pleural effusions, hyponatremia. Thoracentesis took place with 750 mL of clear yellow fluid, right-sided and prior to that left pleural space thoracentesis took place with 500 cc of yellow fluid removed. Chest x-ray reviewed. BNP on 09/05/13 was 3900, BNP on 09/12/13 was 1314.  Anxiety present throughout admission, improved on day of discharge. Her husband is a retired Engineer, drilling.  Her amiodarone was decreased to 200 mg once a day. She is maintaining sinus rhythm throughout her admission. Her husband also states that she has been taking metoprolol succinate 50 mg once a day at home and I have made this appropriate change.  She lives at [facility] and according to her husband has a bed available. She ambulated yesterday with cardiac rehabilitation and felt stronger. Eating improved.  Appreciate Dr. Everrett Coombe team seeing patient while she is here as well. On morning of discharge they felt fine with discharge, she will be seeing Dr. Servando Snare on 09/27/13.  Furosemide has been discontinued in part secondary to hyponatremia. Dr. [name] has  placed her on Aldactone. She also has issues with atonic bladder.  Hyponatremia in part may be exacerbated by sertraline. Overall however it is improved.  Discharge Exam: Blood pressure 139/55, pulse 55, temperature 97.6 F (36.4 C), temperature source Oral, resp. rate 18, height 5' 1.5" (1.562 m), weight 107 lb (48.535 kg), SpO2 95.00%.  General: Thin, elderly, sitting in chair, comfortable Cardiovascular: Regular, bradycardic, occasional ectopy, 1/6 systolic murmur Lungs: No wheezes, mildly decreased breath sounds at bases bilaterally Extremities: No edema, thin Skin: Healing bypass scar Neurologic: Nonfocal. Moves all extremities. Ambulating. Eyes: Right eye patch noted  Disposition: 03-Skilled Nursing Facility  Discharge Orders   Future Appointments Provider Department Dept Phone   09/25/2013 11:30 AM [name], MD [place] [phone]   09/27/2013 10:30 AM Grace Isaac, MD [facility] [phone]   11/05/2013 8:10 AM [facility] [place] Office [phone]   11/19/2013 2:45 PM [name], MD [facility] [phone]   Future Orders Complete By Expires   Diet - low sodium heart healthy  As directed    Increase activity slowly  As directed        Medication List    STOP taking these medications       furosemide 40 MG tablet  Commonly known as:  LASIX     metoprolol 50 MG tablet  Commonly known as:  LOPRESSOR     potassium chloride SA 20 MEQ tablet  Commonly known as:  K-DUR,KLOR-CON      TAKE these medications       amiodarone 200 MG tablet  Commonly known as:  PACERONE  Take 1 tablet (200 mg total) by mouth daily.     aspirin 325 MG  EC tablet  Take 1 tablet (325 mg total) by mouth daily.     calcium-vitamin D 500-200 MG-UNIT per tablet  Commonly known as:  OSCAL WITH D  Take 1 tablet by mouth daily.     cholecalciferol 400 UNITS Tabs tablet   Commonly known as:  VITAMIN D  Take 400 Units by mouth daily.     feeding supplement (ENSURE COMPLETE) Liqd  Take 237 mLs by mouth 2 (two) times daily between meals.     levothyroxine 50 MCG tablet  Commonly known as:  SYNTHROID, LEVOTHROID  Take 50 mcg by mouth daily before breakfast.     magnesium oxide 400 MG tablet  Commonly known as:  MAG-OX  Take 400 mg by mouth daily.     Melatonin 3 MG Tabs  Take 3 mg by mouth at bedtime.     metoprolol succinate 50 MG 24 hr tablet  Commonly known as:  TOPROL-XL  Take 1 tablet (50 mg total) by mouth daily. Take with or immediately following a meal.     omeprazole 20 MG capsule  Commonly known as:  PRILOSEC  Take 20 mg by mouth daily.     sertraline 50 MG tablet  Commonly known as:  ZOLOFT  Take 50 mg by mouth 3 (three) times a week. Tues, Thurs and Saturday     spironolactone 25 MG tablet  Commonly known as:  ALDACTONE  Take 1 tablet (25 mg total) by mouth daily.           Follow-up Information   Follow up with Grace Isaac, MD On 09/27/2013. (Appointment is at 10:30)    Specialty:  Cardiothoracic Surgery   Contact information:   [place] [address] [phone]       Follow up with [facility] On 09/27/2013. (Please get CXR at 9:30)    Contact information:   [place]       Follow up with [name], MD. Call in 2 days. (I would like her to see Dr. [name] in one week and check basic metabolic profile, (spironolactone).)    Specialty:  Cardiology   Contact information:   [address] [place] [address] [phone]      35 minutes spent on discharge, patient education, review of medical records, review of medications, FL2.  SignedCandee Furbish 09/15/2013, 10:21 AM

## 2013-09-16 NOTE — Progress Notes (Signed)
Late Entry  Pt discharged back to [facility] 11/22, and was transported by family.  CSW signing off.

## 2013-09-17 ENCOUNTER — Telehealth: Payer: Self-pay

## 2013-09-17 NOTE — Telephone Encounter (Signed)
[facility] form faxed

## 2013-09-18 DIAGNOSIS — J90 Pleural effusion, not elsewhere classified: Secondary | ICD-10-CM | POA: Diagnosis not present

## 2013-09-18 DIAGNOSIS — I4891 Unspecified atrial fibrillation: Secondary | ICD-10-CM | POA: Diagnosis not present

## 2013-09-18 DIAGNOSIS — E039 Hypothyroidism, unspecified: Secondary | ICD-10-CM | POA: Diagnosis not present

## 2013-09-18 DIAGNOSIS — I2510 Atherosclerotic heart disease of native coronary artery without angina pectoris: Secondary | ICD-10-CM | POA: Diagnosis not present

## 2013-09-21 DIAGNOSIS — G4700 Insomnia, unspecified: Secondary | ICD-10-CM | POA: Diagnosis not present

## 2013-09-24 DIAGNOSIS — I82409 Acute embolism and thrombosis of unspecified deep veins of unspecified lower extremity: Secondary | ICD-10-CM

## 2013-09-24 HISTORY — DX: Acute embolism and thrombosis of unspecified deep veins of unspecified lower extremity: I82.409

## 2013-09-25 ENCOUNTER — Ambulatory Visit (INDEPENDENT_AMBULATORY_CARE_PROVIDER_SITE_OTHER): Payer: Medicare Other | Admitting: Interventional Cardiology

## 2013-09-25 ENCOUNTER — Encounter: Payer: Self-pay | Admitting: Interventional Cardiology

## 2013-09-25 ENCOUNTER — Other Ambulatory Visit: Payer: Self-pay | Admitting: *Deleted

## 2013-09-25 VITALS — BP 134/57 | HR 61 | Ht 61.0 in | Wt 104.0 lb

## 2013-09-25 DIAGNOSIS — Z79899 Other long term (current) drug therapy: Secondary | ICD-10-CM

## 2013-09-25 DIAGNOSIS — I5032 Chronic diastolic (congestive) heart failure: Secondary | ICD-10-CM

## 2013-09-25 DIAGNOSIS — I2510 Atherosclerotic heart disease of native coronary artery without angina pectoris: Secondary | ICD-10-CM | POA: Diagnosis not present

## 2013-09-25 DIAGNOSIS — I480 Paroxysmal atrial fibrillation: Secondary | ICD-10-CM

## 2013-09-25 DIAGNOSIS — J90 Pleural effusion, not elsewhere classified: Secondary | ICD-10-CM

## 2013-09-25 DIAGNOSIS — I4891 Unspecified atrial fibrillation: Secondary | ICD-10-CM

## 2013-09-25 LAB — BASIC METABOLIC PANEL
Calcium: 9.1 mg/dL (ref 8.4–10.5)
Creatinine, Ser: 0.9 mg/dL (ref 0.4–1.2)
Glucose, Bld: 86 mg/dL (ref 70–99)

## 2013-09-25 NOTE — Patient Instructions (Addendum)
Your physician recommends that you continue on your current medications as directed. Please refer to the Current Medication list given to you today.   Lab today: bmet  Your physician recommends that you schedule a follow-up appointment in: 11/07/13 @11 :30

## 2013-09-25 NOTE — Progress Notes (Signed)
Patient ID: Maria [name], female   DOB: Jun 23, 1924, 79 y.o.   MRN: [id number]    [address] [place], [place]  [address] Phone: [phone] Fax:  [phone]  Date:  09/25/2013   ID:  Maria [name], DOB 09-06-1924, MRN [id number]  PCP:  Mathews Argyle, MD   ASSESSMENT:  1. Chronic diastolic heart failure, now compensated on low-dose diuretic therapy 2. Coronary artery disease with resolution of angina after bypass grafting in November 2014 by Dr. Servando Snare 3. Amiodarone therapy without obvious evidence of toxicity 4. Paroxysmal atrial fibrillation with no recurrence on amiodarone 5. Bilateral pleural effusions, clinically improved but still present on exam  PLAN:  1. Increase physical activity. She seems to be stable enough to go back to independent living.  2. Basic metabolic panel today  3. Clinical followup in 4-6 weeks    SUBJECTIVE: Maria [name] is a 79 y.o. female who is status post coronary bypass surgery. Her appetite is still poor. Her breathing is significantly improved. She is able to lay in bed without dyspnea. She has had no palpitations. She denies transient neurological complaints. She is not eating and is therefore losing weight. No nausea vomiting. Physical activity has improved. She and her husband are requesting that she be allowed to return to independent living.   Wt Readings from Last 3 Encounters:  09/25/13 104 lb (47.174 kg)  09/15/13 107 lb (48.535 kg)  09/08/13 117 lb 8.1 oz (53.3 kg)     Past Medical History  Diagnosis Date  . Osteoporosis   . Celiac disease   . Celiac disease   . Carotid artery occlusion     right carotid stenosis 40-60%, 4/08, neg for stenosis repeat study 11/11  . Depression   . Presbycusis   . Macular degeneration   . Urethral stricture   . Moderate aortic insufficiency   . Moderate mitral regurgitation by prior echocardiogram     echo 9/13   . Thyroid nodule     90m, no change 11/11    . RLS (restless legs syndrome)   . Vitamin D deficiency   . Heart murmur   . Pulmonary hypertension   . Hypertension     takes Metoprolol daily  . Atrial fibrillation   . Coronary artery disease   . CHF (congestive heart failure)     takes Lasix daily  . MVP (mitral valve prolapse)   . Moderate obstructive sleep apnea     uses CPAP;sleep study about 4-567monthago  . Hearing loss     both ears  . Numbness     left arm  . Joint swelling   . GERD (gastroesophageal reflux disease)     takes Omeprazole daily  . Gastric ulcer     6-49m6monthgo  . GI bleeding   . Constipation   . Diarrhea   . Hemorrhoids   . Urinary retention   . Urinary anastomotic stricture   . Hypothyroidism     takes Synthroid daily as result of AMiodarone  . Hot flashes     takes ZOloft 3 times a week  . Insomnia     takes Melatonin nightly  . Restless leg   . Peripheral edema     left    Current Outpatient Prescriptions  Medication Sig Dispense Refill  . acetaminophen (TYLENOL) 325 MG tablet Take 650 mg by mouth every 6 (six) hours as needed.      . aMarland Kitcheniodarone (PACERONE) 200 MG tablet  Take 1 tablet (200 mg total) by mouth daily.  30 tablet  3  . aspirin EC 325 MG EC tablet Take 1 tablet (325 mg total) by mouth daily.      . calcium-vitamin D (OSCAL WITH D) 500-200 MG-UNIT per tablet Take 1 tablet by mouth daily.      . cholecalciferol (VITAMIN D) 400 UNITS TABS tablet Take 400 Units by mouth daily.      Marland Kitchen docusate sodium (COLACE) 100 MG capsule Take 100 mg by mouth 2 (two) times daily.      Marland Kitchen levothyroxine (SYNTHROID, LEVOTHROID) 50 MCG tablet Take 50 mcg by mouth daily before breakfast.      . LORazepam (ATIVAN) 0.5 MG tablet Take 0.5 mg by mouth at bedtime.      . magnesium hydroxide (MILK OF MAGNESIA) 400 MG/5ML suspension Take by mouth daily as needed for mild constipation.      . magnesium oxide (MAG-OX) 400 MG tablet Take 400 mg by mouth daily.      . metoprolol succinate (TOPROL-XL) 50 MG 24  hr tablet Take 1 tablet (50 mg total) by mouth daily. Take with or immediately following a meal.  30 tablet  6  . omeprazole (PRILOSEC) 20 MG capsule Take 20 mg by mouth daily.      Marland Kitchen rOPINIRole (REQUIP) 0.25 MG tablet 1/2 of a 0.125 mg take as directed      . sertraline (ZOLOFT) 50 MG tablet Take 50 mg by mouth 3 (three) times a week. Tues, Thurs and Saturday      . spironolactone (ALDACTONE) 25 MG tablet Take 1 tablet (25 mg total) by mouth daily.  30 tablet  6  . temazepam (RESTORIL) 7.5 MG capsule Take 7.5 mg by mouth at bedtime as needed for sleep.      . traMADol (ULTRAM) 50 MG tablet Take by mouth every 6 (six) hours as needed. As directed       No current facility-administered medications for this visit.    Allergies:    Allergies  Allergen Reactions  . Amoxicillin Other (See Comments)    Nausea, dizziness  . Codeine Nausea And Vomiting  . Fosamax [Alendronate Sodium] Other (See Comments)    Jaw pain  . Gluten Meal Other (See Comments)    Celiac Disease  . Hctz [Hydrochlorothiazide] Other (See Comments)    Hyponatremia, GI upset  . Tetanus Toxoids Other (See Comments)    Bad local reaction    Social History:  The patient  reports that she has never smoked. She does not have any smokeless tobacco history on file. She reports that she does not drink alcohol or use illicit drugs.   ROS:  Please see the history of present illness.   No bleeding, syncope, or transient neurological complaints. Denies chills or fever. No drainage from incisions sites.   All other systems reviewed and negative.   OBJECTIVE: VS:  BP 134/57  Pulse 61  Ht 5' 1"  (1.549 m)  Wt 104 lb (47.174 kg)  BMI 19.66 kg/m2 Well nourished, well developed, in no acute distress, appearing her stated age 42: normal Neck: JVD flat. Carotid bruit faint left carotid bruit  Cardiac:  normal S1, S2; RRR; no murmur Lungs:  clear to auscultation bilaterally, no wheezing, rhonchi or rales Abd: soft, nontender, no  hepatomegaly Ext: Edema absent. Pulses 2+ pedal Skin: warm and dry Neuro:  CNs 2-12 intact, no focal abnormalities noted  EKG:  Not performed  Signed, Illene Labrador III, MD 09/25/2013 12:54 PM

## 2013-09-26 ENCOUNTER — Telehealth: Payer: Self-pay | Admitting: Interventional Cardiology

## 2013-09-26 ENCOUNTER — Telehealth: Payer: Self-pay

## 2013-09-26 MED ORDER — SPIRONOLACTONE 25 MG PO TABS: 25.0000 mg | ORAL_TABLET | ORAL | Status: DC

## 2013-09-26 NOTE — Telephone Encounter (Signed)
called to give pt lab results and Dr.Smith instructions phone rings out unable to lmom.

## 2013-09-26 NOTE — Telephone Encounter (Signed)
Message copied by Lamar Laundry on Wed Sep 26, 2013  8:03 AM ------      Message from: Daneen Schick      Created: Tue Sep 25, 2013  5:56 PM       Sodium is 129. Otherwise unremarkable.      Change spironolactone to every other day ------

## 2013-09-26 NOTE — Telephone Encounter (Signed)
Message copied by Lamar Laundry on Wed Sep 26, 2013  8:09 AM ------      Message from: Daneen Schick      Created: Tue Sep 25, 2013  5:56 PM       Sodium is 129. Otherwise unremarkable.      Change spironolactone to every other day ------

## 2013-09-26 NOTE — Telephone Encounter (Signed)
New message  Patient is a residence at [facility], per husband dosage of medication was changed. They need a order Faxed # [phone] over to make this change. Please advise.

## 2013-09-26 NOTE — Telephone Encounter (Signed)
pt husband given lab results and Dr.Smith instructions Sodium is 129. Otherwise unremarkable. Change spironolactone to every other day.pt husband verbalized understanding

## 2013-09-26 NOTE — Telephone Encounter (Signed)
Order given to medical records to fax to [facility] fax [phone].decrease pt spironolactone to 53m every other day

## 2013-09-27 ENCOUNTER — Ambulatory Visit (INDEPENDENT_AMBULATORY_CARE_PROVIDER_SITE_OTHER): Payer: Self-pay | Admitting: Cardiothoracic Surgery

## 2013-09-27 ENCOUNTER — Encounter: Payer: Self-pay | Admitting: Cardiothoracic Surgery

## 2013-09-27 ENCOUNTER — Ambulatory Visit
Admission: RE | Admit: 2013-09-27 | Discharge: 2013-09-27 | Disposition: A | Discharge status: Home / Self Care | Payer: Medicare Other | Source: Ambulatory Visit | Attending: Cardiothoracic Surgery | Admitting: Cardiothoracic Surgery

## 2013-09-27 ENCOUNTER — Telehealth: Payer: Self-pay | Admitting: Interventional Cardiology

## 2013-09-27 VITALS — BP 141/65 | HR 65 | Temp 97.1°F | Resp 16 | Ht 61.5 in | Wt 102.0 lb

## 2013-09-27 DIAGNOSIS — I2510 Atherosclerotic heart disease of native coronary artery without angina pectoris: Secondary | ICD-10-CM

## 2013-09-27 DIAGNOSIS — Z951 Presence of aortocoronary bypass graft: Secondary | ICD-10-CM

## 2013-09-27 DIAGNOSIS — J90 Pleural effusion, not elsewhere classified: Secondary | ICD-10-CM | POA: Diagnosis not present

## 2013-09-27 DIAGNOSIS — Z8679 Personal history of other diseases of the circulatory system: Secondary | ICD-10-CM

## 2013-09-27 DIAGNOSIS — J9819 Other pulmonary collapse: Secondary | ICD-10-CM | POA: Diagnosis not present

## 2013-09-27 NOTE — Telephone Encounter (Signed)
New problem    Need an order for Aldactone that is for every other day to be taken faxed to [phone].

## 2013-09-27 NOTE — Telephone Encounter (Signed)
I spoke with Lattie Haw at the rehab facility. They did not receive order faxed yesterday. I have sent fax in myself today. Horton Chin RN

## 2013-09-27 NOTE — Progress Notes (Signed)
[address]       [place] [address]             [phone]                  Maria [name] [facility] Medical Record #[id number] Date of Birth: 11-16-1923  [name], MD Mathews Argyle, MD  Chief Complaint:   PostOp Follow Up Visit 08/28/2013  PREOPERATIVE DIAGNOSES: Severe 2-vessel coronary artery disease with  unstable angina, history of atrial fibrillation, history of aortic  insufficiency, and mitral insufficiency.  POSTOPERATIVE DIAGNOSES: Severe 2-vessel coronary artery disease with  unstable angina, history of atrial fibrillation, history of aortic  insufficiency, and mitral insufficiency.  SURGICAL PROCEDURE: Coronary artery bypass grafting x2 with left  internal mammary to the left anterior descending coronary artery, and  reverse saphenous vein graft to the circumflex coronary artery, and  placement of a Accucare 40 mm left atrial clip with right leg endo vein  harvesting.  SURGEON: Lanelle Bal, MD   History of Present Illness:     Patient returns to the office today after coronary artery bypass grafting with atrial clip in anterior done one month ago. She developed increasing pleural effusions postoperatively requiring readmission and bilateral thoracentesis. She returns to the office today with followup chest x-ray. Her respiratory status has been stable. She does note some GI upset over the past 24 hours her today. She's had mild pedal edema    History  Smoking status  . Never Smoker   Smokeless tobacco  . Not on file       Allergies  Allergen Reactions  . Amoxicillin Other (See Comments)    Nausea, dizziness  . Codeine Nausea And Vomiting  . Fosamax [Alendronate Sodium] Other (See Comments)    Jaw pain  . Gluten Meal Other (See Comments)    Celiac Disease  . Hctz [Hydrochlorothiazide] Other (See Comments)    Hyponatremia, GI upset  . Tetanus Toxoids Other (See Comments)    Bad local reaction     Current Outpatient Prescriptions  Medication Sig Dispense Refill  . acetaminophen (TYLENOL) 325 MG tablet Take 650 mg by mouth every 6 (six) hours as needed.      Marland Kitchen amiodarone (PACERONE) 200 MG tablet Take 1 tablet (200 mg total) by mouth daily.  30 tablet  3  . aspirin EC 325 MG EC tablet Take 1 tablet (325 mg total) by mouth daily.      . calcium-vitamin D (OSCAL WITH D) 500-200 MG-UNIT per tablet Take 1 tablet by mouth daily.      . cholecalciferol (VITAMIN D) 400 UNITS TABS tablet Take 400 Units by mouth daily.      Marland Kitchen docusate sodium (COLACE) 100 MG capsule Take 100 mg by mouth 2 (two) times daily.      Marland Kitchen levothyroxine (SYNTHROID, LEVOTHROID) 50 MCG tablet Take 50 mcg by mouth daily before breakfast.      . magnesium hydroxide (MILK OF MAGNESIA) 400 MG/5ML suspension Take by mouth daily as needed for mild constipation.      . magnesium oxide (MAG-OX) 400 MG tablet Take 400 mg by mouth daily.      . metoprolol succinate (TOPROL-XL) 50 MG 24 hr tablet Take 1 tablet (50 mg total) by mouth daily. Take with or immediately following a meal.  30 tablet  6  . omeprazole (PRILOSEC) 20 MG capsule Take 20 mg by mouth daily.      Marland Kitchen  rOPINIRole (REQUIP) 0.25 MG tablet 0.125 mg at bedtime.       . sertraline (ZOLOFT) 50 MG tablet Take 50 mg by mouth 3 (three) times a week. Tues, Thurs and Saturday      . spironolactone (ALDACTONE) 25 MG tablet Take 1 tablet (25 mg total) by mouth every other day.      . temazepam (RESTORIL) 7.5 MG capsule Take 7.5 mg by mouth at bedtime as needed for sleep.      . traMADol (ULTRAM) 50 MG tablet Take by mouth every 6 (six) hours as needed. As directed      . LORazepam (ATIVAN) 0.5 MG tablet Take 0.5 mg by mouth at bedtime as needed for sleep.        No current facility-administered medications for this visit.       Physical Exam: BP 141/65  Pulse 65  Temp(Src) 97.1 F (36.2 C) (Oral)  Resp 16  Ht 5' 1.5" (1.562 m)  Wt 102 lb (46.267 kg)  BMI 18.96 kg/m2   SpO2 97%  General appearance: alert and cooperative Neurologic: intact Heart: regular rate and rhythm, S1, S2 normal, no murmur, click, rub or gallop Lungs: diminished breath sounds bibasilar Abdomen: soft, non-tender; bowel sounds normal; no masses,  no organomegaly Extremities: extremities normal, atraumatic, no cyanosis or edema and Homans sign is negative, no sign of DVT Wound: Sternal incision is stable and well healed Wounds:  Diagnostic Studies & Laboratory data:         Recent Radiology Findings: Dg Chest 2 View  09/27/2013   CLINICAL DATA:  CABG 1 month ago  EXAM: CHEST  2 VIEW  COMPARISON:  09/15/2013  FINDINGS: Postop CABG and left atrial appendage clipping.  Improvement in vascular congestion. Small pleural effusions are unchanged. Improved aeration in the lung bases since the prior study. No edema or pneumonia on today's study.  IMPRESSION: Interval improvement since the prior study. Small bilateral effusions and mild atelectasis. Negative for edema or pneumonia.   Electronically Signed   By: Franchot Gallo M.D.   On: 09/27/2013 10:05        Recent Labs: Lab Results  Component Value Date   WBC 8.3 09/12/2013   HGB 10.7* 09/12/2013   HCT 32.0* 09/12/2013   PLT 389 09/12/2013   GLUCOSE 86 09/25/2013   ALT 43* 08/31/2013   AST 59* 08/31/2013   NA 129* 09/25/2013   K 4.6 09/25/2013   CL 97 09/25/2013   CREATININE 0.9 09/25/2013   BUN 9 09/25/2013   CO2 25 09/25/2013   TSH 2.634 09/14/2013   INR 1.24 09/06/2013   HGBA1C 5.8* 08/27/2013      Assessment / Plan:    Improving after coronary artery bypass grafting and bilateral thoracentesis. Her chest x-ray appears improved from previous studies as far as pleural effusion He's encouraged to continue to increase her physical activity, without heavy lifting I plan to see her back with a followup chest x-ray in 3 weeks        Fia Hebert B 09/27/2013 10:31 AM

## 2013-10-02 ENCOUNTER — Telehealth: Payer: Self-pay | Admitting: *Deleted

## 2013-10-02 ENCOUNTER — Other Ambulatory Visit: Payer: Self-pay | Admitting: *Deleted

## 2013-10-02 DIAGNOSIS — M7989 Other specified soft tissue disorders: Secondary | ICD-10-CM

## 2013-10-02 NOTE — Telephone Encounter (Signed)
Dr. Donna Christen calls with concerns of his wife's continued right EVH leg selling and ten derness.  He feels that it is worse than when she saw Dr. Servando Snare last week in the office. I discussed this with Dr Barbra Sarks and he ordered bilateral venous duplex studies of the lower extremities.  These were scheduled and he was notified.

## 2013-10-03 ENCOUNTER — Inpatient Hospital Stay (HOSPITAL_COMMUNITY)
Admission: RE | Admit: 2013-10-03 | Discharge: 2013-10-05 | DRG: 299 | Disposition: A | Discharge status: Home / Self Care | Payer: Medicare Other | Source: Ambulatory Visit | Attending: Interventional Cardiology | Admitting: Interventional Cardiology

## 2013-10-03 ENCOUNTER — Encounter (HOSPITAL_COMMUNITY): Payer: Self-pay

## 2013-10-03 ENCOUNTER — Other Ambulatory Visit: Payer: Self-pay | Admitting: Interventional Cardiology

## 2013-10-03 VITALS — BP 121/43 | HR 58 | Temp 98.6°F | Resp 18 | Ht 61.5 in | Wt 103.3 lb

## 2013-10-03 DIAGNOSIS — I2510 Atherosclerotic heart disease of native coronary artery without angina pectoris: Secondary | ICD-10-CM | POA: Diagnosis present

## 2013-10-03 DIAGNOSIS — I824Z9 Acute embolism and thrombosis of unspecified deep veins of unspecified distal lower extremity: Secondary | ICD-10-CM | POA: Diagnosis present

## 2013-10-03 DIAGNOSIS — F329 Major depressive disorder, single episode, unspecified: Secondary | ICD-10-CM | POA: Diagnosis present

## 2013-10-03 DIAGNOSIS — I495 Sick sinus syndrome: Secondary | ICD-10-CM | POA: Diagnosis present

## 2013-10-03 DIAGNOSIS — R4181 Age-related cognitive decline: Secondary | ICD-10-CM | POA: Diagnosis present

## 2013-10-03 DIAGNOSIS — I82409 Acute embolism and thrombosis of unspecified deep veins of unspecified lower extremity: Secondary | ICD-10-CM | POA: Diagnosis present

## 2013-10-03 DIAGNOSIS — I509 Heart failure, unspecified: Secondary | ICD-10-CM | POA: Diagnosis present

## 2013-10-03 DIAGNOSIS — I5032 Chronic diastolic (congestive) heart failure: Secondary | ICD-10-CM | POA: Diagnosis present

## 2013-10-03 DIAGNOSIS — I82401 Acute embolism and thrombosis of unspecified deep veins of right lower extremity: Secondary | ICD-10-CM

## 2013-10-03 DIAGNOSIS — G609 Hereditary and idiopathic neuropathy, unspecified: Secondary | ICD-10-CM | POA: Diagnosis present

## 2013-10-03 DIAGNOSIS — Z79899 Other long term (current) drug therapy: Secondary | ICD-10-CM | POA: Diagnosis not present

## 2013-10-03 DIAGNOSIS — E43 Unspecified severe protein-calorie malnutrition: Secondary | ICD-10-CM | POA: Diagnosis present

## 2013-10-03 DIAGNOSIS — M79609 Pain in unspecified limb: Secondary | ICD-10-CM

## 2013-10-03 DIAGNOSIS — I2789 Other specified pulmonary heart diseases: Secondary | ICD-10-CM | POA: Diagnosis not present

## 2013-10-03 DIAGNOSIS — I824Y9 Acute embolism and thrombosis of unspecified deep veins of unspecified proximal lower extremity: Secondary | ICD-10-CM | POA: Diagnosis not present

## 2013-10-03 DIAGNOSIS — I10 Essential (primary) hypertension: Secondary | ICD-10-CM | POA: Diagnosis present

## 2013-10-03 DIAGNOSIS — J90 Pleural effusion, not elsewhere classified: Secondary | ICD-10-CM | POA: Diagnosis present

## 2013-10-03 DIAGNOSIS — M7989 Other specified soft tissue disorders: Secondary | ICD-10-CM

## 2013-10-03 DIAGNOSIS — I4891 Unspecified atrial fibrillation: Secondary | ICD-10-CM | POA: Diagnosis present

## 2013-10-03 DIAGNOSIS — Z7901 Long term (current) use of anticoagulants: Secondary | ICD-10-CM | POA: Diagnosis not present

## 2013-10-03 DIAGNOSIS — Z951 Presence of aortocoronary bypass graft: Secondary | ICD-10-CM

## 2013-10-03 DIAGNOSIS — E559 Vitamin D deficiency, unspecified: Secondary | ICD-10-CM | POA: Diagnosis present

## 2013-10-03 DIAGNOSIS — F3289 Other specified depressive episodes: Secondary | ICD-10-CM | POA: Diagnosis present

## 2013-10-03 HISTORY — DX: Acute embolism and thrombosis of unspecified deep veins of unspecified lower extremity: I82.409

## 2013-10-03 LAB — CBC WITH DIFFERENTIAL/PLATELET
Basophils Absolute: 0 10*3/uL (ref 0.0–0.1)
Basophils Relative: 0 % (ref 0–1)
Eosinophils Relative: 4 % (ref 0–5)
Hemoglobin: 11.8 g/dL — ABNORMAL LOW (ref 12.0–15.0)
Lymphocytes Relative: 24 % (ref 12–46)
Lymphs Abs: 1.7 10*3/uL (ref 0.7–4.0)
MCHC: 33.5 g/dL (ref 30.0–36.0)
MCV: 89.6 fL (ref 78.0–100.0)
Monocytes Relative: 8 % (ref 3–12)
Neutro Abs: 4.4 10*3/uL (ref 1.7–7.7)
Neutrophils Relative %: 63 % (ref 43–77)
RBC: 3.93 MIL/uL (ref 3.87–5.11)
WBC: 7 10*3/uL (ref 4.0–10.5)

## 2013-10-03 LAB — APTT: aPTT: 45 seconds — ABNORMAL HIGH (ref 24–37)

## 2013-10-03 LAB — BASIC METABOLIC PANEL
BUN: 7 mg/dL (ref 6–23)
Chloride: 94 mEq/L — ABNORMAL LOW (ref 96–112)
GFR calc Af Amer: >90 mL/min (ref >90)
GFR calc non Af Amer: 80 mL/min — ABNORMAL LOW (ref >90)
Glucose, Bld: 95 mg/dL (ref 70–99)
Potassium: 4.2 mEq/L (ref 3.5–5.1)
Sodium: 127 mEq/L — ABNORMAL LOW (ref 135–145)

## 2013-10-03 LAB — HEMOGLOBIN A1C: Mean Plasma Glucose: 117 mg/dL — ABNORMAL HIGH (ref <117)

## 2013-10-03 LAB — PROTIME-INR
INR: 2.47 — ABNORMAL HIGH (ref 0.00–1.49)
Prothrombin Time: 25.9 seconds — ABNORMAL HIGH (ref 11.6–15.2)

## 2013-10-03 MED ORDER — LORAZEPAM 0.5 MG PO TABS
0.5000 mg | ORAL_TABLET | Freq: Every evening | ORAL | Status: DC | PRN
  Administered 2013-10-03 – 2013-10-04 (×2): 0.5 mg via ORAL
  Filled 2013-10-03 (×2): qty 1

## 2013-10-03 MED ORDER — LEVOTHYROXINE SODIUM 50 MCG PO TABS
50.0000 ug | ORAL_TABLET | Freq: Every day | ORAL | Status: DC
  Administered 2013-10-04 – 2013-10-05 (×2): 50 ug via ORAL
  Filled 2013-10-03 (×3): qty 1

## 2013-10-03 MED ORDER — SERTRALINE HCL 50 MG PO TABS
50.0000 mg | ORAL_TABLET | ORAL | Status: DC
Start: 2013-10-04 — End: 2013-10-05
  Administered 2013-10-04: 50 mg via ORAL
  Filled 2013-10-03: qty 1

## 2013-10-03 MED ORDER — ONDANSETRON HCL 4 MG/2ML IJ SOLN: 4.0000 mg | Freq: Four times a day (QID) | INTRAMUSCULAR | Status: DC | PRN

## 2013-10-03 MED ORDER — ASPIRIN 300 MG RE SUPP
300.0000 mg | RECTAL | Status: DC
  Filled 2013-10-03: qty 1

## 2013-10-03 MED ORDER — ROPINIROLE HCL 0.25 MG PO TABS
0.1250 mg | ORAL_TABLET | Freq: Every day | ORAL | Status: DC
  Administered 2013-10-03 – 2013-10-04 (×2): 0.125 mg via ORAL
  Filled 2013-10-03 (×3): qty 0.5

## 2013-10-03 MED ORDER — CHOLECALCIFEROL 10 MCG (400 UNIT) PO TABS
400.0000 [IU] | ORAL_TABLET | Freq: Every day | ORAL | Status: DC
  Administered 2013-10-04 – 2013-10-05 (×2): 400 [IU] via ORAL
  Filled 2013-10-03 (×2): qty 1

## 2013-10-03 MED ORDER — ASPIRIN 81 MG PO CHEW
324.0000 mg | CHEWABLE_TABLET | ORAL | Status: DC
  Filled 2013-10-03: qty 4

## 2013-10-03 MED ORDER — SPIRONOLACTONE 25 MG PO TABS
25.0000 mg | ORAL_TABLET | ORAL | Status: DC
  Administered 2013-10-05: 25 mg via ORAL
  Filled 2013-10-03: qty 1

## 2013-10-03 MED ORDER — MAGNESIUM HYDROXIDE 400 MG/5ML PO SUSP: 5.0000 mL | Freq: Every day | ORAL | Status: DC | PRN

## 2013-10-03 MED ORDER — ALPRAZOLAM 0.25 MG PO TABS: 0.2500 mg | ORAL_TABLET | Freq: Two times a day (BID) | ORAL | Status: DC | PRN

## 2013-10-03 MED ORDER — RIVAROXABAN 15 MG PO TABS
15.0000 mg | ORAL_TABLET | Freq: Two times a day (BID) | ORAL | Status: DC
  Administered 2013-10-03 – 2013-10-05 (×4): 15 mg via ORAL
  Filled 2013-10-03 (×8): qty 1

## 2013-10-03 MED ORDER — DOCUSATE SODIUM 100 MG PO CAPS
100.0000 mg | ORAL_CAPSULE | Freq: Two times a day (BID) | ORAL | Status: DC
  Administered 2013-10-03 – 2013-10-05 (×4): 100 mg via ORAL
  Filled 2013-10-03 (×5): qty 1

## 2013-10-03 MED ORDER — METOPROLOL SUCCINATE ER 50 MG PO TB24
50.0000 mg | ORAL_TABLET | Freq: Every day | ORAL | Status: DC
  Administered 2013-10-04 – 2013-10-05 (×2): 50 mg via ORAL
  Filled 2013-10-03 (×2): qty 1

## 2013-10-03 MED ORDER — ACETAMINOPHEN 325 MG PO TABS
650.0000 mg | ORAL_TABLET | ORAL | Status: DC | PRN
  Administered 2013-10-03: 650 mg via ORAL
  Filled 2013-10-03: qty 2

## 2013-10-03 MED ORDER — AMIODARONE HCL 200 MG PO TABS
200.0000 mg | ORAL_TABLET | Freq: Every day | ORAL | Status: DC
Start: 2013-10-04 — End: 2013-10-05
  Administered 2013-10-04 – 2013-10-05 (×2): 200 mg via ORAL
  Filled 2013-10-03 (×2): qty 1

## 2013-10-03 MED ORDER — NITROGLYCERIN 0.4 MG SL SUBL: 0.4000 mg | SUBLINGUAL_TABLET | SUBLINGUAL | Status: DC | PRN

## 2013-10-03 MED ORDER — CALCIUM CARBONATE-VITAMIN D 500-200 MG-UNIT PO TABS
1.0000 | ORAL_TABLET | Freq: Every day | ORAL | Status: DC
  Administered 2013-10-04 – 2013-10-05 (×2): 1 via ORAL
  Filled 2013-10-03 (×2): qty 1

## 2013-10-03 MED ORDER — PANTOPRAZOLE SODIUM 40 MG PO TBEC
40.0000 mg | DELAYED_RELEASE_TABLET | Freq: Every day | ORAL | Status: DC
  Administered 2013-10-04 – 2013-10-05 (×2): 40 mg via ORAL
  Filled 2013-10-03 (×2): qty 1

## 2013-10-03 NOTE — H&P (Signed)
Admit date: (Not on file) Referring Physician Dr. Servando Snare Primary Cardiologist Dr. Cheral Almas [name] III Chief complaint/reason for admission: leg swelling  HPI: 79 y/o who had CABG about 5 weeks ago.  She had pleural effusions requiring drainage.  She noted some right leg pain with standing and some swelling.  She was sent for LE u/s which revealed LE DVT and mobile thrombus at the right common femoral vein.  She has some chest pain with deep breathing.  She has some mild DOE which is unchanged.  No acute CP or SHOB.    We agreed on inpatient management of her initial anticoagulation due to the mobile thrombus.     PMH:    Past Medical History  Diagnosis Date  . Osteoporosis   . Celiac disease   . Celiac disease   . Carotid artery occlusion     right carotid stenosis 40-60%, 4/08, neg for stenosis repeat study 11/11  . Depression   . Presbycusis   . Macular degeneration   . Urethral stricture   . Moderate aortic insufficiency   . Moderate mitral regurgitation by prior echocardiogram     echo 9/13   . Thyroid nodule     36m, no change 11/11  . RLS (restless legs syndrome)   . Vitamin D deficiency   . Heart murmur   . Pulmonary hypertension   . Hypertension     takes Metoprolol daily  . Atrial fibrillation   . Coronary artery disease   . CHF (congestive heart failure)     takes Lasix daily  . MVP (mitral valve prolapse)   . Moderate obstructive sleep apnea     uses CPAP;sleep study about 4-541monthago  . Hearing loss     both ears  . Numbness     left arm  . Joint swelling   . GERD (gastroesophageal reflux disease)     takes Omeprazole daily  . Gastric ulcer     6-49m449monthgo  . GI bleeding   . Constipation   . Diarrhea   . Hemorrhoids   . Urinary retention   . Urinary anastomotic stricture   . Hypothyroidism     takes Synthroid daily as result of AMiodarone  . Hot flashes     takes ZOloft 3 times a week  . Insomnia     takes Melatonin nightly  .  Restless leg   . Peripheral edema     left    PSH:    Past Surgical History  Procedure Laterality Date  . Appendectomy    . Tubal ligation    . Tonsillectomy    . Trigger thumb    . Cardiac catheterization  08-06-13  . Tcs    . Bilateral cataract surgery    . Mandible surgery    . Coronary artery bypass graft N/A 08/28/2013    Procedure: CORONARY ARTERY BYPASS GRAFTING (CABG) x2 using right greater saphenous vein and left internal mammary artery. ;  Surgeon: EdwGrace IsaacD;  [facility];  Laterality: N/A;  . Intraoperative transesophageal echocardiogram N/A 08/28/2013    Procedure: INTRAOPERATIVE TRANSESOPHAGEAL ECHOCARDIOGRAM;  Surgeon: EdwGrace IsaacD;  [facility];  Laterality: N/A;    ALLERGIES:   Amoxicillin; Codeine; Fosamax; Gluten meal; Hctz; and Tetanus toxoids  Prior to Admit Meds:   (Not in a hospital admission) Family HX:    Family History  Problem Relation Age of Onset  .  Heart attack Mother   . CVA Mother   . CAD Father   . Colon cancer Father   . Heart attack Father   . Heart attack Brother   . Peripheral vascular disease Brother   . AAA (abdominal aortic aneurysm) Brother    Social HX:    History   Social History  . Marital Status: Married    Spouse Name: N/A    Number of Children: N/A  . Years of Education: N/A   Occupational History  . Not on file.   Social History Main Topics  . Smoking status: Never Smoker   . Smokeless tobacco: Not on file  . Alcohol Use: No  . Drug Use: No  . Sexual Activity: Yes    Birth Control/ Protection: Surgical   Other Topics Concern  . Not on file   Social History Narrative  . No narrative on file     ROS:  All 11 ROS were addressed and are negative except what is stated in the HPI  PHYSICAL EXAM There were no vitals filed for this visit. General: Well developed, well nourished, in no acute distress Head: Eyes PERRLA, No  xanthomas.   Normal cephalic and atramatic  Lungs:   Clear bilaterally to auscultation and percussion. Heart:   HRRR S1 S2              No JVD.  No abdominal bruits. No femoral bruits. Abdomen:  abdomen soft and non-tender Msk:  . Normal strength and tone for age. Extremities:  2+ Right leg  edema.  Neuro: Alert and oriented X 3. Psych:  Normal affect, responds appropriately   Labs:   Lab Results  Component Value Date   WBC 8.3 09/12/2013   HGB 10.7* 09/12/2013   HCT 32.0* 09/12/2013   MCV 92.2 09/12/2013   PLT 389 09/12/2013   No results found for this basename: NA, K, CL, CO2, BUN, CREATININE, CALCIUM, LABALBU, PROT, BILITOT, ALKPHOS, ALT, AST, GLUCOSE,  in the last 168 hours Lab Results  Component Value Date   TROPONINI <0.30 09/12/2013   No results found for this basename: PTT   Lab Results  Component Value Date   INR 1.24 09/06/2013   INR 1.64* 08/28/2013   INR 1.10 08/27/2013     No results found for this basename: CHOL   No results found for this basename: HDL   No results found for this basename: LDLCALC   No results found for this basename: TRIG   No results found for this basename: CHOLHDL   No results found for this basename: LDLDIRECT      Radiology:  @[id number] @  EKG:  pending  ASSESSMENT: Acute DVT post CABG  PLAN:  Admit for anticoagulation.  Will start Xarelto 15 mg BID for now.  Renal function and Hbg was stable recently.   Secondary prevention for CAD.    Bed rest until she receives her anticoagulation.  Jettie Booze., MD  10/03/2013  10:56 AM

## 2013-10-03 NOTE — Progress Notes (Signed)
*  PRELIMINARY RESULTS* Vascular Ultrasound Lower extremity venous duplex has been completed.  Preliminary findings:  DVT involving the right femoral, popliteal, posterior tibial, and peroneal veins. Mobile thrombus noted in proximal femoral vein. No DVT noted in left leg.   Called report to Dr. Everrett Coombe office. Spoke with Manuela Schwartz, she will page MD and call back with instructions.   Landry Mellow, RDMS, RVT  10/03/2013, 10:05 AM

## 2013-10-03 NOTE — Progress Notes (Signed)
Pt still refusing IV attempt tonight. Pt states "I will let them try tomorrow, they already tried twice already". Pt educated on the importance of iv access. Will continue to monitor.

## 2013-10-03 NOTE — Progress Notes (Signed)
After several unsuccessful IV insertion attempts, patient has requested she not be stuck anymore.  Dr. Irish Lack notified of patient's wishes.  Will attempt to further educate the patient of the importance of having IV access in hopes of her allowing [place] to try again.  [place], Ardeth Sportsman

## 2013-10-04 ENCOUNTER — Ambulatory Visit (HOSPITAL_COMMUNITY): Payer: Medicare Other

## 2013-10-04 ENCOUNTER — Inpatient Hospital Stay (HOSPITAL_COMMUNITY): Payer: Medicare Other

## 2013-10-04 DIAGNOSIS — M7989 Other specified soft tissue disorders: Secondary | ICD-10-CM | POA: Diagnosis not present

## 2013-10-04 DIAGNOSIS — E43 Unspecified severe protein-calorie malnutrition: Secondary | ICD-10-CM | POA: Diagnosis not present

## 2013-10-04 DIAGNOSIS — I2789 Other specified pulmonary heart diseases: Secondary | ICD-10-CM | POA: Diagnosis not present

## 2013-10-04 DIAGNOSIS — I82409 Acute embolism and thrombosis of unspecified deep veins of unspecified lower extremity: Secondary | ICD-10-CM

## 2013-10-04 DIAGNOSIS — I824Y9 Acute embolism and thrombosis of unspecified deep veins of unspecified proximal lower extremity: Secondary | ICD-10-CM | POA: Diagnosis not present

## 2013-10-04 DIAGNOSIS — J90 Pleural effusion, not elsewhere classified: Secondary | ICD-10-CM | POA: Diagnosis not present

## 2013-10-04 LAB — LIPID PANEL
HDL: 36 mg/dL — ABNORMAL LOW (ref >39)
LDL Cholesterol: 79 mg/dL (ref 0–99)

## 2013-10-04 MED ORDER — BOOST / RESOURCE BREEZE PO LIQD
1.0000 | Freq: Three times a day (TID) | ORAL | Status: DC
  Administered 2013-10-04 – 2013-10-05 (×2): 1 via ORAL

## 2013-10-04 NOTE — Progress Notes (Signed)
INITIAL NUTRITION ASSESSMENT  DOCUMENTATION CODES Per approved criteria  -Severe malnutrition in the context of acute illness or injury   INTERVENTION:  Resource Breeze PO TID, each supplement provides 250 kcal and 9 grams of protein.  Discussed ways to increase calorie and protein intake with patient's gluten free diet. Also reviewed other menu options available at the hospital.  NUTRITION DIAGNOSIS: Malnutrition related to poor oral intake as evidenced by 10% weight loss in the past 6 weeks and moderate depletion of muscle mass.   Goal: Intake to meet >90% of estimated nutrition needs.  Monitor:  PO intake, labs, weight trend.  Reason for Assessment: MST  79 y.o. female  Admitting Dx: right leg pain and swelling  ASSESSMENT: Patient was admitted to the hospital on 12/10 due to leg pain and swelling. LE ultrasound PTA revealed LE DVT and mobile thrombus at the right common femoral vein.  Patient endorses 12-14 lb weight loss since her CABG surgery ~ 5 weeks ago. She c/o altered and decreased taste. She has celiac disease and has followed a gluten free diet for the past 80 years. Discussed our menu options and how ordering meals works with the Agricultural engineer. Patient agreed to try Resource Breeze supplement to maximize oral intake. She tried the [employer] and likes it. Provided information on how to obtain this supplement after D/C home.  Nutrition Focused Physical Exam:  Subcutaneous Fat:  Orbital Region: WNL Upper Arm Region: WNL Thoracic and Lumbar Region: NA  Muscle:  Temple Region: moderate depletion Clavicle Bone Region: moderate depletion Clavicle and Acromion Bone Region: moderate depletion Scapular Bone Region: NA Dorsal Hand: moderate depletion Patellar Region: NA Anterior Thigh Region: NA Posterior Calf Region: NA  Edema: LE edema due to DVT  Pt meets criteria for severe MALNUTRITION in the context of acute illness as evidenced by 10%  weight loss in the past 6 weeks and moderate depletion of muscle mass.  Height: Ht Readings from Last 1 Encounters:  10/03/13 5' 1.5" (1.562 m)    Weight: Wt Readings from Last 1 Encounters:  10/03/13 103 lb (46.72 kg)    Ideal Body Weight: 48.9 kg  % Ideal Body Weight: 96%  Wt Readings from Last 10 Encounters:  10/03/13 103 lb (46.72 kg)  09/27/13 102 lb (46.267 kg)  09/25/13 104 lb (47.174 kg)  09/15/13 107 lb (48.535 kg)  09/08/13 117 lb 8.1 oz (53.3 kg)  09/08/13 117 lb 8.1 oz (53.3 kg)  08/27/13 112 lb 11.2 oz (51.12 kg)  08/20/13 114 lb (51.71 kg)  08/16/13 115 lb (52.164 kg)  08/16/13 115 lb (52.164 kg)    Usual Body Weight: 115 lb (6 weeks ago)  % Usual Body Weight: 90%  BMI:  Body mass index is 19.15 kg/(m^2).  Estimated Nutritional Needs: Kcal: 1400-1600 to support repletion Protein: 70-90 gm Fluid: 1.4-1.6 L  Skin: no wounds  Diet Order: Gluten Restricted  EDUCATION NEEDS: -Education needs addressed   Intake/Output Summary (Last 24 hours) at 10/04/13 1047 Last data filed at 10/04/13 0846  Gross per 24 hour  Intake    240 ml  Output      0 ml  Net    240 ml    Last BM: 12/9   Labs:   Recent Labs Lab 10/03/13 1520  NA 127*  K 4.2  CL 94*  CO2 23  BUN 7  CREATININE 0.60  CALCIUM 8.8  GLUCOSE 95    CBG (last 3)  No results found for  this basename: GLUCAP,  in the last 72 hours  Scheduled Meds: . amiodarone  200 mg Oral Daily  . calcium-vitamin D  1 tablet Oral Daily  . cholecalciferol  400 Units Oral Daily  . docusate sodium  100 mg Oral BID  . levothyroxine  50 mcg Oral QAC breakfast  . metoprolol succinate  50 mg Oral Daily  . pantoprazole  40 mg Oral Daily  . Rivaroxaban  15 mg Oral BID WC  . rOPINIRole  0.125 mg Oral QHS  . sertraline  50 mg Oral Custom  . [START ON 10/05/2013] spironolactone  25 mg Oral QODAY    Continuous Infusions:   Past Medical History  Diagnosis Date  . Osteoporosis   . Celiac disease    . Celiac disease   . Carotid artery occlusion     right carotid stenosis 40-60%, 4/08, neg for stenosis repeat study 11/11  . Depression   . Presbycusis   . Macular degeneration   . Urethral stricture   . Moderate aortic insufficiency   . Moderate mitral regurgitation by prior echocardiogram     echo 9/13   . Thyroid nodule     50m, no change 11/11  . RLS (restless legs syndrome)   . Vitamin D deficiency   . Heart murmur   . Pulmonary hypertension   . Hypertension     takes Metoprolol daily  . Atrial fibrillation   . Coronary artery disease   . CHF (congestive heart failure)     takes Lasix daily  . MVP (mitral valve prolapse)   . Moderate obstructive sleep apnea     uses CPAP;sleep study about 4-555monthago  . Hearing loss     both ears  . Numbness     left arm  . Joint swelling   . GERD (gastroesophageal reflux disease)     takes Omeprazole daily  . Gastric ulcer     6-52m20monthgo  . GI bleeding   . Constipation   . Diarrhea   . Hemorrhoids   . Urinary retention   . Urinary anastomotic stricture   . Hypothyroidism     takes Synthroid daily as result of AMiodarone  . Hot flashes     takes ZOloft 3 times a week  . Insomnia     takes Melatonin nightly  . Restless leg   . Peripheral edema     left  . DVT (deep venous thrombosis)     Past Surgical History  Procedure Laterality Date  . Appendectomy    . Tubal ligation    . Tonsillectomy    . Trigger thumb    . Cardiac catheterization  08-06-13  . Tcs    . Bilateral cataract surgery    . Mandible surgery    . Coronary artery bypass graft N/A 08/28/2013    Procedure: CORONARY ARTERY BYPASS GRAFTING (CABG) x2 using right greater saphenous vein and left internal mammary artery. ;  Surgeon: EdwGrace IsaacD;  [facility];  Laterality: N/A;  . Intraoperative transesophageal echocardiogram N/A 08/28/2013    Procedure: INTRAOPERATIVE TRANSESOPHAGEAL ECHOCARDIOGRAM;  Surgeon:  EdwGrace IsaacD;  [facility];  Laterality: N/A;    KimMolli BarrowsD, LDN, CNSOak Creekger [phone] Hours Pager [phone]

## 2013-10-04 NOTE — Progress Notes (Signed)
Patient ID: Maria [name], female   DOB: 1924-06-06, 79 y.o.   MRN: [id number]      [address]       [place] [address]             [phone]                      LOS: 1 day   Subjective: Feels better today, rt  leg not painful, does feel as tight  Objective: Vital signs in last 24 hours: Patient Vitals for the past 24 hrs:  BP Temp Temp src Pulse Resp SpO2 Height Weight  10/04/13 0429 120/70 mmHg 97.9 F (36.6 C) Oral 55 18 93 % - -  10/03/13 2013 107/63 mmHg 98 F (36.7 C) Oral 60 20 97 % - -  10/03/13 1310 154/54 mmHg 97.2 F (36.2 C) Oral 60 19 100 % 5' 1.5" (1.562 m) 103 lb (46.72 kg)    Filed Weights   10/03/13 1310  Weight: 103 lb (46.72 kg)    Hemodynamic parameters for last 24 hours:    Intake/Output from previous day:   Intake/Output this shift: Total I/O In: 240 [P.O.:240] Out: -   Scheduled Meds: . amiodarone  200 mg Oral Daily  . calcium-vitamin D  1 tablet Oral Daily  . cholecalciferol  400 Units Oral Daily  . docusate sodium  100 mg Oral BID  . levothyroxine  50 mcg Oral QAC breakfast  . metoprolol succinate  50 mg Oral Daily  . pantoprazole  40 mg Oral Daily  . Rivaroxaban  15 mg Oral BID WC  . rOPINIRole  0.125 mg Oral QHS  . sertraline  50 mg Oral Custom  . [START ON 10/05/2013] spironolactone  25 mg Oral QODAY   Continuous Infusions:  PRN Meds:.acetaminophen, ALPRAZolam, LORazepam, magnesium hydroxide, nitroGLYCERIN, ondansetron (ZOFRAN) IV  General appearance: alert and cooperative Neurologic: intact Heart: regular rate and rhythm, S1, S2 normal, no murmur, click, rub or gallop Lungs: clear to auscultation bilaterally Abdomen: soft, non-tender; bowel sounds normal; no masses,  no organomegaly Extremities: right leg much less swollen  Lab Results: CBC: Recent Labs  10/03/13 1520  WBC 7.0  HGB 11.8*  HCT 35.2*  PLT 388   BMET:  Recent Labs  10/03/13 1520  NA 127*  K 4.2  CL 94*  CO2 23  GLUCOSE  95  BUN 7  CREATININE 0.60  CALCIUM 8.8    PT/INR:  Recent Labs  10/03/13 1520  LABPROT 25.9*  INR 2.47*     Radiology No results found.   Assessment/Plan: S/P   CABG on 08/28/2013  Now acute onset of DVT  Allow bathroom privileges with help Follow up chest xray for effusions    Grace Isaac MD 10/04/2013 8:48 AM

## 2013-10-04 NOTE — Progress Notes (Signed)
       Patient Name: Maria [name] Date of Encounter: 10/04/2013    SUBJECTIVE: She feels better this morning. The right leg is back to normal size.  TELEMETRY:  Normal sinus rhythm Filed Vitals:   10/03/13 1310 10/03/13 2013 10/04/13 0429  BP: 154/54 107/63 120/70  Pulse: 60 60 55  Temp: 97.2 F (36.2 C) 98 F (36.7 C) 97.9 F (36.6 C)  TempSrc: Oral Oral Oral  Resp: 19 20 18   Height: 5' 1.5" (1.562 m)    Weight: 103 lb (46.72 kg)    SpO2: 100% 97% 93%    Intake/Output Summary (Last 24 hours) at 10/04/13 1310 Last data filed at 10/04/13 0846  Gross per 24 hour  Intake    240 ml  Output      0 ml  Net    240 ml    LABS: Basic Metabolic Panel:  Recent Labs  10/03/13 1520  NA 127*  K 4.2  CL 94*  CO2 23  GLUCOSE 95  BUN 7  CREATININE 0.60  CALCIUM 8.8   CBC:  Recent Labs  10/03/13 1520  WBC 7.0  NEUTROABS 4.4  HGB 11.8*  HCT 35.2*  MCV 89.6  PLT 388   Cardiac Enzymes: No results found for this basename: CKTOTAL, CKMB, CKMBINDEX, TROPONINI,  in the last 72 hours BNP: No components found with this basename: POCBNP,  Hemoglobin A1C:  Recent Labs  10/03/13 1520  HGBA1C 5.7*   Fasting Lipid Panel:  Recent Labs  10/04/13 0500  CHOL 135  HDL 36*  LDLCALC 79  TRIG 102  CHOLHDL 3.8    Radiology/Studies:  Admission chest x-ray IMPRESSION:  Interval improvement since the prior study. Small bilateral  effusions and mild atelectasis. Negative for edema or pneumonia.   Physical Exam: Blood pressure 120/70, pulse 55, temperature 97.9 F (36.6 C), temperature source Oral, resp. rate 18, height 5' 1.5" (1.562 m), weight 103 lb (46.72 kg), SpO2 93.00%. Weight change:   Regular rhythm. No rub Clear lung field No peripheral edema ASSESSMENT:  1. Acute DVT right lower extremity, clinically improved 2. Recent CABG 3. History of paroxysmal atrial fibrillation  Plan:  Therapeutic Xarelto for 3-6 months before discontinuing. We'll  give another 24 hours of therapy in hospital and consider discharge tomorrow. Bathroom privileges  Signed, [name] 10/04/2013, 1:10 PM

## 2013-10-04 NOTE — Care Management Note (Signed)
    Page 1 of 2   10/05/2013     12:15:41 PM   CARE MANAGEMENT NOTE 10/05/2013  Patient:  Maria [name], Maria [name]   Account Number:  [id number]  Date Initiated:  10/04/2013  Documentation initiated by:  Marvetta Gibbons  Subjective/Objective Assessment:   Pt admitted with DVT- recent CABG     Action/Plan:   PTA pt lived at home with spouse   Anticipated DC Date:  10/05/2013   Anticipated DC Plan:  [place]  CM consult      Choice offered to / List presented to:             Status of service:  Completed, signed off Medicare Important Message given?   (If response is "NO", the following Medicare IM given date fields will be blank) Date Medicare IM given:   Date Additional Medicare IM given:    Discharge Disposition:  HOME/SELF CARE  Per UR Regulation:  Reviewed for med. necessity/level of care/duration of stay  If discussed at [place] of Stay Meetings, dates discussed:    Comments:  10/05/13- 1145- Marvetta Gibbons RN, BSN [phone] F/u done regarding Xarelto- per benefits check pt is covered for drug at- [facility] $42.00/30 day supply - no prior auth required-  [facility] for Mail Order $116.00/ No prior auth required---Best to use a preferred [place] with pt and spouse at bedside and shared info on benefits- also called [facility] who stated that they have 68 pills in stock (can do a partial fill and have more drug in stock on Monday 12/15) pt and spouse aware of this info also- No further CM needs- pt may d/c later today vs tomorrow.   10/04/13- 1500- Azelyn Batie RN,BSN [phone] Spoke with pt and spouse regarding medication coverage- pt has part D coverage through [employer] [facility] drug plan- Member ID # [id number] (cust. service# [phone]) per spouse they get most meds through mail order- [facility]. Local pharmacy of choice is [facility]. 30 day free card given for Xarelto to spouse- will call [facility] to check on availability of drug.  and send benefits check in to insurance to see about pt's co-pay.

## 2013-10-04 NOTE — Progress Notes (Signed)
Utilization review completed.  

## 2013-10-05 DIAGNOSIS — J90 Pleural effusion, not elsewhere classified: Secondary | ICD-10-CM | POA: Diagnosis not present

## 2013-10-05 DIAGNOSIS — I5032 Chronic diastolic (congestive) heart failure: Secondary | ICD-10-CM

## 2013-10-05 DIAGNOSIS — I2510 Atherosclerotic heart disease of native coronary artery without angina pectoris: Secondary | ICD-10-CM | POA: Diagnosis not present

## 2013-10-05 DIAGNOSIS — I824Y9 Acute embolism and thrombosis of unspecified deep veins of unspecified proximal lower extremity: Secondary | ICD-10-CM | POA: Diagnosis not present

## 2013-10-05 DIAGNOSIS — I82409 Acute embolism and thrombosis of unspecified deep veins of unspecified lower extremity: Secondary | ICD-10-CM | POA: Diagnosis not present

## 2013-10-05 DIAGNOSIS — Z79899 Other long term (current) drug therapy: Secondary | ICD-10-CM

## 2013-10-05 DIAGNOSIS — E43 Unspecified severe protein-calorie malnutrition: Secondary | ICD-10-CM | POA: Diagnosis not present

## 2013-10-05 DIAGNOSIS — I2789 Other specified pulmonary heart diseases: Secondary | ICD-10-CM | POA: Diagnosis not present

## 2013-10-05 LAB — BASIC METABOLIC PANEL
BUN: 8 mg/dL (ref 6–23)
CO2: 21 mEq/L (ref 19–32)
Calcium: 8.4 mg/dL (ref 8.4–10.5)
Chloride: 97 mEq/L (ref 96–112)
Glucose, Bld: 87 mg/dL (ref 70–99)
Sodium: 126 mEq/L — ABNORMAL LOW (ref 135–145)

## 2013-10-05 LAB — CBC
HCT: 32.1 % — ABNORMAL LOW (ref 36.0–46.0)
Hemoglobin: 11.2 g/dL — ABNORMAL LOW (ref 12.0–15.0)
MCH: 30.9 pg (ref 26.0–34.0)
MCHC: 34.9 g/dL (ref 30.0–36.0)
MCV: 88.4 fL (ref 78.0–100.0)
Platelets: 392 10*3/uL (ref 150–400)
RBC: 3.63 MIL/uL — ABNORMAL LOW (ref 3.87–5.11)
WBC: 6.6 10*3/uL (ref 4.0–10.5)

## 2013-10-05 MED ORDER — RIVAROXABAN 20 MG PO TABS: 20.0000 mg | ORAL_TABLET | Freq: Every day | ORAL | Status: DC

## 2013-10-05 MED ORDER — RIVAROXABAN 15 MG PO TABS: 15.0000 mg | ORAL_TABLET | Freq: Two times a day (BID) | ORAL | Status: DC

## 2013-10-05 NOTE — Clinical Documentation Improvement (Signed)
THIS DOCUMENT IS NOT A PERMANENT PART OF THE MEDICAL RECORD  Please update your documentation with the medical record to reflect your response to this query. If you need help knowing how to do this please call [phone].  10/05/13   Dear Dr. [name],  Per Registered Dietitian evaluation 12/11: "Pt meets criteria for Severe Malnutrition in the context of acute illness as evidenced by 10% weight loss in the past 6 weeks and moderate depletion of muscle mass."  Possible Clinical Conditions? - Severe Protein Calorie Malnutrition - Protein Calorie Malnutrition  - Cachexia   - Other Condition (please specify)  Supporting Information: - Temple Region: moderate depletion  - Clavicle Bone Region: moderate depletion  - Clavicle and Acromion Bone Region: moderate depletion - Dorsal Hand: moderate depletion  You may use possible, probable, or suspect with inpatient documentation. possible, probable, suspected diagnoses MUST be documented at the time of discharge  Reviewed:  no additional documentation provided  Thank You,  [place] Documentation Specialist: [place]   I already documented Malnutrition as a discharge diagnosis in DC summary. Why do or is there anything else I need to do?

## 2013-10-05 NOTE — Progress Notes (Signed)
Came to visit patient at bedside to explain and offer [place] Management services. Patient reports her husband is a retired Engineer, drilling and she thinks he is her "follow up" at home. Declines [facility] Care Management services at this time. Left brochure at bedside in case she changes her mind in the future. Appreciative of visit.  Marthenia Rolling, MSN- Ed, [facility] [phone]

## 2013-10-05 NOTE — Discharge Summary (Signed)
Patient ID: Maria [name] MRN: [id number] DOB/AGE: [birth year] 79 y.o.  Admit date: 10/03/2013 Discharge date: 10/05/2013  Primary Discharge Diagnosis: Left lower extremity DVT Secondary Discharge Diagnosis: CAD with recent CABG Paroxysmal atrial fibrillation/sinus node dysfunction Recent CABG with postop pleural effusions Chronic diastolic heart failure Hypertension, controlled Protein calorie malnutrition Frailty Patient Active Problem List   Diagnosis Date Noted  . Coronary atherosclerosis of native coronary artery 08/16/2013    Priority: High    Class: Acute  . Pulmonary hypertension, moderate to severe 08/07/2013    Priority: Medium  . Chronic diastolic heart failure 73/53/2992    Priority: Medium    Class: Chronic  . Protein-calorie malnutrition, severe 10/04/2013  . DVT (deep venous thrombosis) 10/03/2013  . Pleural effusion 09/15/2013  . Hx of CABG 09/12/2013  . Malnutrition of mild degree 09/03/2013  . Respiratory failure, post-operative 08/29/2013  . Mitral regurgitation 08/07/2013  . Essential hypertension, benign 08/02/2013  . Nontoxic uninodular goiter 08/02/2013  . Unspecified hereditary and idiopathic peripheral neuropathy 08/02/2013  . Occlusion and stenosis of carotid artery without mention of cerebral infarction 08/02/2013  . Unspecified vitamin D deficiency 08/02/2013  . Depressive disorder, not elsewhere classified 08/02/2013  . Paroxysmal atrial fibrillation 08/02/2013  . Trifascicular block 08/02/2013  . Gastric ulcer, unspecified as acute or chronic, without mention of hemorrhage, perforation, or obstruction 08/02/2013  . Sinoatrial node dysfunction 08/02/2013  . Other dyspnea and respiratory abnormality 08/02/2013  . Encounter for long-term (current) use of other medications 08/02/2013  . NONSPECIFIC ABNORMAL FIND RAD&OTH EXAM GI TRACT 05/30/2008   Significant Diagnostic Studies: Lower extremity venous Doppler study  Consults: TCTS,  Dr. Verdia Kuba Course: Maria [name] is now approximately 6 weeks post coronary bypass surgery. She has been home for nearly 10 days when she began experiencing right lower extremity discomfort and progressive swelling. When she saw Dr. Servando Snare in followup this was evaluated with a lower extremity Doppler study which documented acute DVT. Her leg was swollen and tender.  She was admitted to the cardiology service by Dr. Irish Lack has started on systemic anticoagulation with Xarelto. 24 hours after initiation of anticoagulation, lower extremity swelling completely resolved and discomfort significantly decreased.  She has been able to ambulate for personal hygiene without discomfort. She denies shortness of breath.  Laboratory data revealed a sodium of 127 (this is been a chronic recurring problem been on diuretic therapy). Kidney function is normal with a creatinine of 0.6 and a GFR estimation of greater than 60 cc per minute.   Discharge Exam: Blood pressure 126/76, pulse 71, temperature 98 F (36.7 C), temperature source Oral, resp. rate 18, height 5' 1.5" (1.562 m), weight 103 lb 4.8 oz (46.857 kg), SpO2 94.00%.    Right lower extremity and left lower extremity reveal varicosities. No significant swelling.  Lungs are clear with diminished breath sounds at the right base.  Cardiac auscultation reveals apical systolic murmur graded at 2/6. No gallop is heard.  The neck veins are flat. Labs:   Lab Results  Component Value Date   WBC 7.0 10/03/2013   HGB 11.8* 10/03/2013   HCT 35.2* 10/03/2013   MCV 89.6 10/03/2013   PLT 388 10/03/2013    Recent Labs Lab 10/03/13 1520  NA 127*  K 4.2  CL 94*  CO2 23  BUN 7  CREATININE 0.60  CALCIUM 8.8  GLUCOSE 95   Lab Results  Component Value Date   TROPONINI <0.30 09/12/2013    Lab  Results  Component Value Date   CHOL 135 10/04/2013   Lab Results  Component Value Date   HDL 36* 10/04/2013   Lab Results    Component Value Date   LDLCALC 79 10/04/2013   Lab Results  Component Value Date   TRIG 102 10/04/2013   Lab Results  Component Value Date   CHOLHDL 3.8 10/04/2013   No results found for this basename: LDLDIRECT      Radiology:  IMPRESSION:  Cardiomegaly. Clipping right atrium.  Left-sided pleural effusion.  Pulmonary vascular prominence.  New nodular patchy change right lung base may represent an  infectious infiltrate. Recommend followup until clearance.    EKG: Performed on the day of discharge revealed by atrial abnormality, right bundle branch block, left anterior hemiblock, and first degree AV block. This is a stable pattern compare with prior  [place]  Future Appointments Provider Department Dept Phone   10/08/2013 11:15 AM [facility] [place] [phone]   10/10/2013 11:15 AM [facility] [place] [phone]   10/12/2013 11:15 AM [facility] [address] [place] [phone]   10/15/2013 11:15 AM [facility] [place] [phone]   10/17/2013 11:15 AM [facility] [place] [phone]   10/17/2013 12:30 PM Grace Isaac, MD [facility] [phone]   10/22/2013 11:15 AM [facility] [place] [phone]   10/24/2013 11:15 AM [facility] Monitor [place] [phone]   10/26/2013 11:15 AM [facility] Monitor [place] [phone]   10/29/2013 11:15 AM [facility] Monitor [place] [phone]   10/31/2013 11:15 AM [facility] Monitor [place] [phone]   11/02/2013 11:15 AM [facility] Monitor [place] [phone]   11/05/2013 8:10 AM [facility] [place] [phone]   11/05/2013 11:15 AM [facility] Monitor [place] [phone]   11/07/2013 11:15 AM [facility] Monitor [place] [phone]   11/07/2013 11:30 AM [name], MD [place] [phone]   11/09/2013 11:15 AM [facility] Monitor [place] [phone]   11/12/2013 11:15 AM [facility] Monitor [place] [phone]   11/14/2013 11:15 AM [facility] Monitor [place] [phone]   11/16/2013 11:15 AM [facility] Monitor [place] [phone]   11/19/2013 11:15 AM [facility] Monitor [place] [phone]   11/19/2013 2:45 PM [name], MD [place] [phone]   11/21/2013 11:15 AM [facility] Monitor [place] [phone]   11/23/2013 11:15 AM [facility] Monitor [place] [phone]   11/26/2013 11:15 AM [facility] Monitor [place] [phone]   11/28/2013 11:15 AM [facility] Monitor [place] [phone]   11/30/2013 11:15 AM [facility] Monitor [place] [phone]   12/03/2013 11:15 AM [facility] Monitor [place] [phone]   12/05/2013 11:15 AM [facility] Monitor [place] [phone]   12/07/2013 11:15 AM Mc[facility]Phase2  [place]   12/10/2013 11:15 AM [facility] Monitor [place] [phone]   12/12/2013 11:15 AM [facility] Monitor [place] [phone]   12/14/2013  11:15 AM [facility] Monitor [place] [phone]   12/17/2013 11:15 AM Mc[facility] Monitor [place] [phone]   12/19/2013 11:15 AM Mc[facility] Monitor [place] [phone]   12/21/2013 11:15 AM Mc[facility] Monitor [place] [phone]   12/24/2013 11:15 AM Mc-Phase2 Monitor [place] [phone]   12/26/2013 11:15 AM Mc[facility]Phase[facility] Monitor [place] [phone]   12/28/2013 11:15 AM Mc-Phase2 Monitor [place] [phone]   12/31/2013 11:15 AM [facility] Monitor [place] [phone]   01/02/2014 11:15 AM Mc-Phase2 Monitor [place] [phone]   01/04/2014 11:15 AM Mc-Phase2 Monitor [place] [phone]   01/07/2014 11:15 AM Mc[facility]Phase[facility] Monitor [place] [phone]   01/09/2014 11:15 AM Mc-Phase[facility] Monitor [place] [phone]   01/11/2014 11:15 AM Mc[facility]Phase[facility] Monitor [place] [phone]       Medication List    STOP taking these medications       aspirin EC 81 MG tablet      TAKE these medications       acetaminophen 325 MG tablet  Commonly known as:  TYLENOL  Take 650 mg by mouth every 6 (six) hours as needed.     amiodarone 200 MG tablet  Commonly known as:  PACERONE  Take 1 tablet (200 mg total) by mouth daily.     calcium-vitamin D 500-200 MG-UNIT per tablet  Commonly known as:  OSCAL WITH D  Take 1 tablet by mouth daily.     cholecalciferol 400 UNITS Tabs tablet  Commonly known as:  VITAMIN D  Take 400 Units by mouth daily.     docusate sodium 100 MG capsule  Commonly known as:  COLACE  Take 100 mg by mouth 2 (two) times daily.      levothyroxine 50 MCG tablet  Commonly known as:  SYNTHROID, LEVOTHROID  Take 50 mcg by mouth daily before breakfast.     LORazepam 0.5 MG tablet  Commonly known as:  ATIVAN  Take 0.5 mg by mouth at bedtime as needed for sleep.     magnesium hydroxide 400 MG/5ML suspension  Commonly known as:  MILK OF MAGNESIA  Take 5 mLs by mouth daily as needed for mild constipation.     magnesium oxide 400 MG tablet  Commonly known as:  MAG-OX  Take 400 mg by mouth daily.     metoprolol succinate 50 MG 24 hr tablet  Commonly known as:  TOPROL-XL  Take 1 tablet (50 mg total) by mouth daily. Take with or immediately following a meal.     omeprazole 20 MG capsule  Commonly known as:  PRILOSEC  Take 20 mg by mouth daily.     Rivaroxaban 15 MG Tabs tablet  Commonly known as:  XARELTO  Take 1 tablet (15 mg total) by mouth 2 (two) times daily with a meal.     rOPINIRole 0.25 MG tablet  Commonly known as:  REQUIP  Take 0.125 mg  by mouth at bedtime.     sertraline 50 MG tablet  Commonly known as:  ZOLOFT  Take 50 mg by mouth every other day.     spironolactone 25 MG tablet  Commonly known as:  ALDACTONE  Take 1 tablet (25 mg total) by mouth every other day.     temazepam 7.5 MG capsule  Commonly known as:  RESTORIL  Take 7.5 mg by mouth at bedtime.         BRING ALL MEDICATIONS WITH YOU TO FOLLOW UP APPOINTMENTS  Time spent with patient to include physician time:  35 min Signed: [name] 10/05/2013, 12:03 PM

## 2013-10-08 ENCOUNTER — Ambulatory Visit (HOSPITAL_COMMUNITY): Payer: Medicare Other

## 2013-10-10 ENCOUNTER — Ambulatory Visit (HOSPITAL_COMMUNITY): Payer: Medicare Other

## 2013-10-10 ENCOUNTER — Telehealth: Payer: Self-pay | Admitting: Interventional Cardiology

## 2013-10-10 NOTE — Telephone Encounter (Signed)
New Problem:  Pt's husband is calling with questions about the pt's phlebitis and would like to speak to the nurse/doctor. Husband is requesting a call back

## 2013-10-10 NOTE — Telephone Encounter (Signed)
return call to pt husband.pt husband spoke with Dr.Smith ok for pt to gradual increase in activity.

## 2013-10-12 ENCOUNTER — Ambulatory Visit (HOSPITAL_COMMUNITY): Payer: Medicare Other

## 2013-10-12 ENCOUNTER — Other Ambulatory Visit: Payer: Self-pay | Admitting: *Deleted

## 2013-10-12 DIAGNOSIS — I2510 Atherosclerotic heart disease of native coronary artery without angina pectoris: Secondary | ICD-10-CM

## 2013-10-15 ENCOUNTER — Ambulatory Visit (HOSPITAL_COMMUNITY): Payer: Medicare Other

## 2013-10-17 ENCOUNTER — Ambulatory Visit (INDEPENDENT_AMBULATORY_CARE_PROVIDER_SITE_OTHER): Payer: Self-pay | Admitting: Physician Assistant

## 2013-10-17 ENCOUNTER — Ambulatory Visit
Admission: RE | Admit: 2013-10-17 | Discharge: 2013-10-17 | Disposition: A | Discharge status: Home / Self Care | Payer: Medicare Other | Source: Ambulatory Visit | Attending: Cardiothoracic Surgery | Admitting: Cardiothoracic Surgery

## 2013-10-17 ENCOUNTER — Ambulatory Visit (HOSPITAL_COMMUNITY): Payer: Medicare Other

## 2013-10-17 ENCOUNTER — Encounter: Payer: Self-pay | Admitting: Cardiothoracic Surgery

## 2013-10-17 VITALS — BP 119/57 | HR 49 | Resp 16 | Ht 61.5 in | Wt 103.0 lb

## 2013-10-17 DIAGNOSIS — I82409 Acute embolism and thrombosis of unspecified deep veins of unspecified lower extremity: Secondary | ICD-10-CM

## 2013-10-17 DIAGNOSIS — I2510 Atherosclerotic heart disease of native coronary artery without angina pectoris: Secondary | ICD-10-CM

## 2013-10-17 DIAGNOSIS — I82403 Acute embolism and thrombosis of unspecified deep veins of lower extremity, bilateral: Secondary | ICD-10-CM

## 2013-10-17 DIAGNOSIS — Z951 Presence of aortocoronary bypass graft: Secondary | ICD-10-CM

## 2013-10-17 DIAGNOSIS — J90 Pleural effusion, not elsewhere classified: Secondary | ICD-10-CM | POA: Diagnosis not present

## 2013-10-17 MED ORDER — TEMAZEPAM 7.5 MG PO CAPS: 7.5000 mg | ORAL_CAPSULE | Freq: Every day | ORAL | Status: DC

## 2013-10-17 NOTE — Progress Notes (Signed)
Maria [name] [address]       [place],Maria [name] [address]             [phone]                  Maria [name]  Medical Record #[id number] Date of Birth: Apr 25, 1924  Maria Grooms, MD Mathews Argyle, MD  Chief Complaint:   PostOp Follow Up Visit   History of Present Illness:     This is an 79 year old Caucasian female who is s/p CABG x 2 11/14/201 by Dr. Servando Snare. Post op course Was complicated by a fib with RVR and pleural effusions, which required thoracenteses. Most recently she was found to have a DVT of the right lower extremity and was hospitalized from 12/10-12/12. Her complaints include occasional sternal incisional pain, right calf pain (although improved), a tremor in right hand, and fatigue. She denies chest pain, shortness of breath, fever, or chills.   History  Smoking status  . Never Smoker   Smokeless tobacco  . Never Used       Allergies  Allergen Reactions  . Amoxicillin Other (See Comments)    Nausea, dizziness  . Codeine Nausea And Vomiting  . Fosamax [Alendronate Sodium] Other (See Comments)    Jaw pain  . Gluten Meal Other (See Comments)    Celiac Disease  . Hctz [Hydrochlorothiazide] Other (See Comments)    Hyponatremia, GI upset  . Tetanus Toxoids Other (See Comments)    Bad local reaction    Current Outpatient Prescriptions  Medication Sig Dispense Refill  . acetaminophen (TYLENOL) 325 MG tablet Take 650 mg by mouth every 6 (six) hours as needed.      Marland Kitchen amiodarone (PACERONE) 200 MG tablet Take 1 tablet (200 mg total) by mouth daily.  30 tablet  3  . calcium-vitamin D (OSCAL WITH D) 500-200 MG-UNIT per tablet Take 1 tablet by mouth daily.      . cholecalciferol (VITAMIN D) 400 UNITS TABS tablet Take 400 Units by mouth daily.      Marland Kitchen docusate sodium (COLACE) 100 MG capsule Take 100 mg by mouth 2 (two) times daily.      Marland Kitchen levothyroxine (SYNTHROID, LEVOTHROID) 50 MCG tablet Take 50 mcg by mouth daily before  breakfast.      . magnesium hydroxide (MILK OF MAGNESIA) 400 MG/5ML suspension Take 5 mLs by mouth daily as needed for mild constipation.       . magnesium oxide (MAG-OX) 400 MG tablet Take 400 mg by mouth daily.      . metoprolol succinate (TOPROL-XL) 50 MG 24 hr tablet Take 1 tablet (50 mg total) by mouth daily. Take with or immediately following a meal.  30 tablet  6  . omeprazole (PRILOSEC) 20 MG capsule Take 20 mg by mouth daily.      . Rivaroxaban (XARELTO) 15 MG TABS tablet Take 1 tablet (15 mg total) by mouth 2 (two) times daily with a meal.  42 tablet  0  . sertraline (ZOLOFT) 50 MG tablet Take 50 mg by mouth every other day.      . spironolactone (ALDACTONE) 25 MG tablet Take 1 tablet (25 mg total) by mouth every other day.      . temazepam (RESTORIL) 7.5 MG capsule Take 1 capsule (7.5 mg total) by mouth at bedtime.  30 capsule  0  . Rivaroxaban (XARELTO) 20 MG TABS tablet Take 1 tablet (20 mg total) by mouth  daily with supper. Start January 3rd.  30 tablet  5  . rOPINIRole (REQUIP) 0.25 MG tablet Take 0.125 mg by mouth at bedtime.        No current facility-administered medications for this visit.    Physical Exam: BP 119/57  Pulse 49  Resp 16  Ht 5' 1.5" (1.562 [name])  Wt 46.72 kg (103 lb)  BMI 19.15 kg/m2  SpO2 98%  General appearance: alert, cooperative and no distress Neurologic: intact Heart: Bradycardic, no murmur, gallop, or rub Lungs: clear to auscultation bilaterally Abdomen: soft, non-tender; bowel sounds normal; no masses,  no organomegaly Extremities: Some tenderness right calf, right lower extremity is more swollen than left. RLE wound is clean and dry Wounds: Clean and dry. No signs of infection.  Diagnostic Studies & Laboratory data:         Recent Radiology Findings: Dg Chest 2 View  10/17/2013   CLINICAL DATA:  Post CABG 7 weeks ago.  Follow-up pleural effusions  EXAM: CHEST  2 VIEW  COMPARISON:  10/04/2013 and 09/27/2013.  FINDINGS: Post CABG and atrial  ligation. Cardiomegaly. Calcified tortuous aorta.  Very small bilateral pleural effusion/pleural thickening greater on the left, relatively similar to the recent examination.  Previously noted right base patchy density has cleared. No segmental consolidation or pneumothorax.  IMPRESSION: Post CABG and atrial ligation.  Cardiomegaly.  Calcified tortuous aorta.  Very small bilateral pleural effusion/pleural thickening greater on the left, relatively similar to the recent examination.  Previously noted right base patchy density has cleared.   Electronically Signed   By: Chauncey Cruel [name].D.   On: 10/17/2013 09:29      Recent Labs: Lab Results  Component Value Date   WBC 6.6 10/05/2013   HGB 11.2* 10/05/2013   HCT 32.1* 10/05/2013   PLT 392 10/05/2013   GLUCOSE 87 10/05/2013   CHOL 135 10/04/2013   TRIG 102 10/04/2013   HDL 36* 10/04/2013   LDLCALC 79 10/04/2013   ALT 43* 08/31/2013   AST 59* 08/31/2013   NA 126* 10/05/2013   K 3.8 10/05/2013   CL 97 10/05/2013   CREATININE 0.63 10/05/2013   BUN 8 10/05/2013   CO2 21 10/05/2013   TSH 2.634 09/14/2013   INR 2.47* 10/03/2013   HGBA1C 5.7* 10/03/2013      Assessment / Plan:    Overall, she is continuing to recover well. Her heart rate is mostly in the 50's (occasional low 60') and she is on Amiodarone 200 mg daily (post op a fib) as well as Toprol XL 50 daily. Hopefully, Amiodarone will be stopped soon. The beta blocker may need to be decreased as well. She has a follow up appointment to see Dr. [name] in a few weeks and he will address these issues. Her husband asked for a refill for her Restoril as unable to obtain from prescribing doctor at this time. I gave her a prescription for 7.5 mg at bedtime (#30 with no refills). Regarding the new tremor in her right hand, I instructed her to call Dr. Felipa Eth who may want to refer her to a neurologist. She was encouraged to increase her activity as she is only walking minimal distances currently.  Also, her husband has asked about compression stockings. He has ordered a pair for her and I said that would be ok. She will return to see Dr. Servando Snare in 2-3 weeks.   Maria Brigham M PA-C 10/17/2013 10:14 AM

## 2013-10-19 ENCOUNTER — Ambulatory Visit (HOSPITAL_COMMUNITY): Payer: Medicare Other

## 2013-10-22 ENCOUNTER — Ambulatory Visit (HOSPITAL_COMMUNITY): Payer: Medicare Other

## 2013-10-24 ENCOUNTER — Ambulatory Visit (HOSPITAL_COMMUNITY): Payer: Medicare Other

## 2013-10-26 ENCOUNTER — Ambulatory Visit (HOSPITAL_COMMUNITY): Payer: Medicare Other

## 2013-10-29 ENCOUNTER — Ambulatory Visit (HOSPITAL_COMMUNITY): Payer: Medicare Other

## 2013-10-31 ENCOUNTER — Ambulatory Visit (HOSPITAL_COMMUNITY): Payer: Medicare Other

## 2013-11-02 ENCOUNTER — Ambulatory Visit (HOSPITAL_COMMUNITY): Payer: Medicare Other

## 2013-11-05 ENCOUNTER — Ambulatory Visit (HOSPITAL_COMMUNITY): Payer: Medicare Other

## 2013-11-05 ENCOUNTER — Ambulatory Visit (INDEPENDENT_AMBULATORY_CARE_PROVIDER_SITE_OTHER): Payer: Medicare Other | Admitting: *Deleted

## 2013-11-05 DIAGNOSIS — R5383 Other fatigue: Secondary | ICD-10-CM | POA: Diagnosis not present

## 2013-11-05 DIAGNOSIS — Z0000 Encounter for general adult medical examination without abnormal findings: Secondary | ICD-10-CM | POA: Diagnosis not present

## 2013-11-05 DIAGNOSIS — I4891 Unspecified atrial fibrillation: Secondary | ICD-10-CM

## 2013-11-05 DIAGNOSIS — R259 Unspecified abnormal involuntary movements: Secondary | ICD-10-CM | POA: Diagnosis not present

## 2013-11-05 DIAGNOSIS — R609 Edema, unspecified: Secondary | ICD-10-CM | POA: Diagnosis not present

## 2013-11-05 DIAGNOSIS — I480 Paroxysmal atrial fibrillation: Secondary | ICD-10-CM

## 2013-11-05 DIAGNOSIS — K219 Gastro-esophageal reflux disease without esophagitis: Secondary | ICD-10-CM | POA: Diagnosis not present

## 2013-11-05 DIAGNOSIS — H6980 Other specified disorders of Eustachian tube, unspecified ear: Secondary | ICD-10-CM | POA: Diagnosis not present

## 2013-11-05 DIAGNOSIS — R110 Nausea: Secondary | ICD-10-CM | POA: Diagnosis not present

## 2013-11-05 DIAGNOSIS — R5381 Other malaise: Secondary | ICD-10-CM | POA: Diagnosis not present

## 2013-11-05 LAB — HEPATIC FUNCTION PANEL
ALT: 28 U/L (ref 0–35)
AST: 32 U/L (ref 0–37)
Albumin: 3.4 g/dL — ABNORMAL LOW (ref 3.5–5.2)
Alkaline Phosphatase: 66 U/L (ref 39–117)
BILIRUBIN TOTAL: 0.6 mg/dL (ref 0.3–1.2)
Bilirubin, Direct: 0.1 mg/dL (ref 0.0–0.3)
Total Protein: 6.7 g/dL (ref 6.0–8.3)

## 2013-11-05 LAB — TSH: TSH: 4.57 u[IU]/mL (ref 0.35–5.50)

## 2013-11-07 ENCOUNTER — Encounter: Payer: Self-pay | Admitting: Interventional Cardiology

## 2013-11-07 ENCOUNTER — Ambulatory Visit (HOSPITAL_COMMUNITY): Payer: Medicare Other

## 2013-11-07 ENCOUNTER — Ambulatory Visit (INDEPENDENT_AMBULATORY_CARE_PROVIDER_SITE_OTHER): Payer: Medicare Other | Admitting: Interventional Cardiology

## 2013-11-07 VITALS — BP 112/50 | HR 50 | Ht 61.5 in | Wt 112.0 lb

## 2013-11-07 DIAGNOSIS — I498 Other specified cardiac arrhythmias: Secondary | ICD-10-CM

## 2013-11-07 DIAGNOSIS — I272 Pulmonary hypertension, unspecified: Secondary | ICD-10-CM

## 2013-11-07 DIAGNOSIS — I82409 Acute embolism and thrombosis of unspecified deep veins of unspecified lower extremity: Secondary | ICD-10-CM

## 2013-11-07 DIAGNOSIS — Z5181 Encounter for therapeutic drug level monitoring: Secondary | ICD-10-CM | POA: Insufficient documentation

## 2013-11-07 DIAGNOSIS — R001 Bradycardia, unspecified: Secondary | ICD-10-CM

## 2013-11-07 DIAGNOSIS — I2789 Other specified pulmonary heart diseases: Secondary | ICD-10-CM

## 2013-11-07 DIAGNOSIS — I2510 Atherosclerotic heart disease of native coronary artery without angina pectoris: Secondary | ICD-10-CM

## 2013-11-07 DIAGNOSIS — I480 Paroxysmal atrial fibrillation: Secondary | ICD-10-CM

## 2013-11-07 DIAGNOSIS — Z7901 Long term (current) use of anticoagulants: Secondary | ICD-10-CM

## 2013-11-07 DIAGNOSIS — I4891 Unspecified atrial fibrillation: Secondary | ICD-10-CM

## 2013-11-07 DIAGNOSIS — I5032 Chronic diastolic (congestive) heart failure: Secondary | ICD-10-CM

## 2013-11-07 DIAGNOSIS — Z79899 Other long term (current) drug therapy: Secondary | ICD-10-CM

## 2013-11-07 MED ORDER — METOPROLOL SUCCINATE ER 25 MG PO TB24: 25.0000 mg | ORAL_TABLET | Freq: Every day | ORAL | Status: DC

## 2013-11-07 MED ORDER — SPIRONOLACTONE 25 MG PO TABS: 25.0000 mg | ORAL_TABLET | ORAL | Status: DC

## 2013-11-07 NOTE — Progress Notes (Signed)
Patient ID: Maria [name], female   DOB: January 24, 1924, 80 y.o.   MRN: [id number]    [address], [address] [place], [place]  [address] Phone: [phone] Fax:  [phone]  Date:  11/07/2013   ID:  Maria [name], DOB 01-17-1924, MRN [id number]  PCP:  Mathews Argyle, MD   ASSESSMENT:  1. Bradycardia, with heart rate of 45 beats per minute on the combination of amiodarone and metoprolol. The patient complaints of significant exertional fatigue which is probably related to this problem.  2. Amiodarone therapy for atrial fib suppression  3. Exertional fatigue and dyspnea  4. Recent DVT, currently on anticoagulation therapy, Xarelto  5. Recent coronary artery bypass grafting   PLAN:  1. Discontinue amiodarone 2. Decreased metoprolol succinate 25 mg per day 3. Clinical followup in one week if she is still feeling weak and tired otherwise I'll see her in one month 4. Call in 48 hours to give [place] a progress report on symptoms.   SUBJECTIVE: Maria [name] is a 80 y.o. female  who is status post coronary bypass surgery. Bypass surgery was complicated by postoperative atrial fibrillation. She has a prior history of atrial fibrillation before surgery. Several weeks after surgery she developed a right lower extremity DVT and his been on so relative oh 50 mg twice a day until 7 days ago when the dose was decreased to 20 mg per day. She has progressively felt more fatigued and dyspneic with exertion over the past 7-10 days. Her appetite is decreasing. She denies orthopnea, PND, chest pain. She has not had syncope.   Wt Readings from Last 3 Encounters:  11/07/13 112 lb (50.803 kg)  10/17/13 103 lb (46.72 kg)  10/05/13 103 lb 4.8 oz (46.857 kg)     Past Medical History  Diagnosis Date  . Osteoporosis   . Celiac disease   . Celiac disease   . Carotid artery occlusion     right carotid stenosis 40-60%, 4/08, neg for stenosis repeat study 11/11  . Depression   .  Presbycusis   . Macular degeneration   . Urethral stricture   . Moderate aortic insufficiency   . Moderate mitral regurgitation by prior echocardiogram     echo 9/13   . Thyroid nodule     89m, no change 11/11  . RLS (restless legs syndrome)   . Vitamin D deficiency   . Heart murmur   . Pulmonary hypertension   . Hypertension     takes Metoprolol daily  . Atrial fibrillation   . Coronary artery disease   . CHF (congestive heart failure)     takes Lasix daily  . MVP (mitral valve prolapse)   . Moderate obstructive sleep apnea     uses CPAP;sleep study about 4-564monthago  . Hearing loss     both ears  . Numbness     left arm  . Joint swelling   . GERD (gastroesophageal reflux disease)     takes Omeprazole daily  . Gastric ulcer     6-90m39monthgo  . GI bleeding   . Constipation   . Diarrhea   . Hemorrhoids   . Urinary retention   . Urinary anastomotic stricture   . Hypothyroidism     takes Synthroid daily as result of AMiodarone  . Hot flashes     takes ZOloft 3 times a week  . Insomnia     takes Melatonin nightly  . Restless leg   . Peripheral  edema     left  . DVT (deep venous thrombosis)     Current Outpatient Prescriptions  Medication Sig Dispense Refill  . acetaminophen (TYLENOL) 325 MG tablet Take 650 mg by mouth every 6 (six) hours as needed.      Marland Kitchen amiodarone (PACERONE) 200 MG tablet Take 1 tablet (200 mg total) by mouth daily.  30 tablet  3  . docusate sodium (COLACE) 100 MG capsule Take 100 mg by mouth 2 (two) times daily.      Marland Kitchen levothyroxine (SYNTHROID, LEVOTHROID) 50 MCG tablet Take 50 mcg by mouth daily before breakfast.      . magnesium hydroxide (MILK OF MAGNESIA) 400 MG/5ML suspension Take 5 mLs by mouth daily as needed for mild constipation.       . metoprolol succinate (TOPROL-XL) 50 MG 24 hr tablet Take 1 tablet (50 mg total) by mouth daily. Take with or immediately following a meal.  30 tablet  6  . omeprazole (PRILOSEC) 20 MG capsule Take 20  mg by mouth daily.      . Rivaroxaban (XARELTO) 20 MG TABS tablet Take 1 tablet (20 mg total) by mouth daily with supper. Start January 3rd.  30 tablet  5  . sertraline (ZOLOFT) 50 MG tablet Take 50 mg by mouth every other day.      . spironolactone (ALDACTONE) 25 MG tablet Take 1 tablet (25 mg total) by mouth every other day.      . temazepam (RESTORIL) 7.5 MG capsule Take 1 capsule (7.5 mg total) by mouth at bedtime.  30 capsule  0   No current facility-administered medications for this visit.    Allergies:    Allergies  Allergen Reactions  . Amoxicillin Other (See Comments)    Nausea, dizziness  . Codeine Nausea And Vomiting  . Fosamax [Alendronate Sodium] Other (See Comments)    Jaw pain  . Gluten Meal Other (See Comments)    Celiac Disease  . Hctz [Hydrochlorothiazide] Other (See Comments)    Hyponatremia, GI upset  . Tetanus Toxoids Other (See Comments)    Bad local reaction    Social History:  The patient  reports that she has never smoked. She has never used smokeless tobacco. She reports that she does not drink alcohol or use illicit drugs.   ROS:  Please see the history of present illness.   Poor appetite, weakness, progressive loss of exertional tolerance. Tremor in both hands. No bleeding on anticoagulation therapy. Denies syncope.   All other systems reviewed and negative.   OBJECTIVE: VS:  BP 112/50  Pulse 50  Ht 5' 1.5" (1.562 m)  Wt 112 lb (50.803 kg)  BMI 20.82 kg/m2 Well nourished, well developed, in no acute distress,  good skin color and appears healthier than on last office visit. This is in contradistinction to the way she feels. HEENT: normal Neck: JVD significant elevation to the angle of the jaw bilaterally with the patient sitting. Carotid bruit absent  Cardiac:  normal S1, S2; RRR; 8-4/5 systolic murmur heard loudest at the apex  Lungs:  clear to auscultation bilaterally, no wheezing, rhonchi or rales Abd: soft, nontender, no hepatomegaly Ext: Edema  right greater than left lower extremity edema . Pulses frankly palpable  Skin: warm and dry Neuro:  CNs 2-12 intact, no focal abnormalities noted  EKG:  Marked sinus bradycardia at 45 beats per minute bifascicular block with right bundle and left anterior hemiblock poor R-wave progression V3 through V6. The poor R-wave progression is  more significant than on prior studies.       Signed, Illene Labrador III, MD 11/07/2013 11:24 AM

## 2013-11-07 NOTE — Patient Instructions (Signed)
Your physician has recommended you make the following change in your medication:   1) STOP Amiodarone 2) Decrease Metoprolol to 25m daily  Your physician recommends that you schedule a follow-up appointment in: 1 week 11/14/13 @11 :15  Call the office on Thurs, Fri with an update of how you are feeling. [phone]

## 2013-11-08 ENCOUNTER — Ambulatory Visit (INDEPENDENT_AMBULATORY_CARE_PROVIDER_SITE_OTHER): Payer: Self-pay | Admitting: Cardiothoracic Surgery

## 2013-11-08 ENCOUNTER — Encounter: Payer: Self-pay | Admitting: Cardiothoracic Surgery

## 2013-11-08 VITALS — BP 111/60 | HR 49 | Resp 16 | Ht 61.5 in | Wt 103.0 lb

## 2013-11-08 DIAGNOSIS — I82409 Acute embolism and thrombosis of unspecified deep veins of unspecified lower extremity: Secondary | ICD-10-CM

## 2013-11-08 DIAGNOSIS — Z951 Presence of aortocoronary bypass graft: Secondary | ICD-10-CM

## 2013-11-08 DIAGNOSIS — I2510 Atherosclerotic heart disease of native coronary artery without angina pectoris: Secondary | ICD-10-CM

## 2013-11-08 NOTE — Progress Notes (Signed)
[name] Medical Record #[id number] Date of Birth: 10-Sep-1924  Lesleigh Noe, MD Ginette Otto, MD  Chief Complaint:   PostOp Follow Up Visit 08/28/2013  PREOPERATIVE DIAGNOSES: Severe 2-vessel coronary artery disease with  unstable angina, history of atrial fibrillation, history of aortic  insufficiency, and mitral insufficiency.  POSTOPERATIVE DIAGNOSES: Severe 2-vessel coronary artery disease with  unstable angina, history of atrial fibrillation, history of aortic  insufficiency, and mitral insufficiency.  SURGICAL PROCEDURE: Coronary artery bypass grafting x2 with left  internal mammary to the left anterior descending coronary artery, and  reverse saphenous vein graft to the circumflex coronary artery, and  placement of a Accucare 40 mm left atrial clip with right leg endo vein  harvesting.  SURGEON: Sheliah Plane, MD   History of Present Illness:     Patient returns to the office today after coronary artery bypass grafting with atrial clip in anterior done two  month ago. She developed increasing pleural effusions postoperatively requiring readmission and bilateral thoracentesis. S Her respiratory status has been stable. She notes that the last several weeks she's been very limited by nausea and very poor appetite. She was noted in cardiology office yesterday to have a low heart rate, because of this and the nausea her amiodarone has been stopped.  History  Smoking status  . Never Smoker   Smokeless tobacco  . Never Used       Allergies  Allergen Reactions  . Amoxicillin Other (See Comments)    Nausea, dizziness  . Codeine Nausea And Vomiting  . Fosamax [Alendronate Sodium] Other (See Comments)    Jaw pain  . Gluten Meal Other (See Comments)    Celiac Disease  . Hctz [Hydrochlorothiazide] Other (See Comments)    Hyponatremia, GI upset  . Tetanus Toxoids Other (See Comments)    Bad local reaction     Current Outpatient Prescriptions  Medication Sig Dispense Refill  . acetaminophen (TYLENOL) 325 MG tablet Take 650 mg by mouth every 6 (six) hours as needed.      . docusate sodium (COLACE) 100 MG capsule Take 100 mg by mouth daily as needed.       Marland Kitchen levothyroxine (SYNTHROID, LEVOTHROID) 50 MCG tablet Take 50 mcg by mouth daily before breakfast.      . metoprolol succinate (TOPROL-XL) 25 MG 24 hr tablet Take 25 mg by mouth every other day. Take with or immediately following a meal.      . omeprazole (PRILOSEC) 20 MG capsule Take 20 mg by mouth every other day.       . Rivaroxaban (XARELTO) 20 MG TABS tablet Take 1 tablet (20 mg total) by mouth daily with supper. Start January 3rd.  30 tablet  5  . sertraline (ZOLOFT) 50 MG tablet Take 50 mg by mouth every other day.      . spironolactone (ALDACTONE) 25 MG tablet Take 1 tablet (25 mg total) by mouth every other day.  15 tablet  11  . temazepam (RESTORIL) 7.5 MG capsule Take 1 capsule (7.5 mg total) by mouth at bedtime.  30 capsule  0  . magnesium hydroxide (MILK OF MAGNESIA) 400 MG/5ML suspension Take 5 mLs by mouth daily as needed for mild constipation.        No current facility-administered medications for this visit.       Physical Exam: BP 111/60  Pulse 49  Resp 16  Ht 5' 1.5" (1.562 m)  Wt 103 lb (46.72 kg)  BMI 19.15 kg/m2  SpO2 98%  General appearance: alert and cooperative Neurologic: intact Heart: regular rate and rhythm, S1, S2 normal, no murmur, click, rub or gallop Lungs:  breath sounds improved  Abdomen: soft, non-tender; bowel sounds normal; no masses,  no organomegaly Extremities: extremities normal, atraumatic, no cyanosis or edema and Homans sign is negative, no sign of DVT left leg, right leg has mild pedal edema Wound: Sternal incision is stable and well healed    Diagnostic Studies & Laboratory data:         Recent Radiology Findings: No results found.      Recent Labs: Lab Results  Component  Value Date   WBC 6.6 10/05/2013   HGB 11.2* 10/05/2013   HCT 32.1* 10/05/2013   PLT 392 10/05/2013   GLUCOSE 87 10/05/2013   CHOL 135 10/04/2013   TRIG 102 10/04/2013   HDL 36* 10/04/2013   LDLCALC 79 10/04/2013   ALT 28 11/05/2013   AST 32 11/05/2013   NA 126* 10/05/2013   K 3.8 10/05/2013   CL 97 10/05/2013   CREATININE 0.63 10/05/2013   BUN 8 10/05/2013   CO2 21 10/05/2013   TSH 4.57 11/05/2013   INR 2.47* 10/03/2013   HGBA1C 5.7* 10/03/2013      Assessment / Plan:    Patient's primary problem currently appears to be poor appetite and nausea , I agree with Dr. Katrinka Blazing and is highly likely related to amiodarone this is been stopped yesterday  Hopefully been off amiodarone her appetite will improve  I plan to see her back with a followup chest x-ray in 3 weeks        Curran Lenderman B 11/08/2013 5:28 PM

## 2013-11-09 ENCOUNTER — Ambulatory Visit (HOSPITAL_COMMUNITY): Payer: Medicare Other

## 2013-11-09 ENCOUNTER — Telehealth: Payer: Self-pay | Admitting: Interventional Cardiology

## 2013-11-09 NOTE — Telephone Encounter (Signed)
Message copied by Lamar Laundry on Fri Nov 09, 2013  1:47 PM ------      Message from: Daneen Schick      Created: Thu Nov 08, 2013  7:54 AM       Labs okay ------

## 2013-11-09 NOTE — Telephone Encounter (Signed)
New message  Patient husband calling   Not much better will see you on Wednesday.

## 2013-11-09 NOTE — Telephone Encounter (Signed)
pt husband given lab results.Labs okay.pt husband verbalized understanding.

## 2013-11-10 ENCOUNTER — Other Ambulatory Visit: Payer: Self-pay | Admitting: *Deleted

## 2013-11-10 DIAGNOSIS — I4891 Unspecified atrial fibrillation: Secondary | ICD-10-CM

## 2013-11-12 ENCOUNTER — Ambulatory Visit (HOSPITAL_COMMUNITY): Payer: Medicare Other

## 2013-11-14 ENCOUNTER — Ambulatory Visit (INDEPENDENT_AMBULATORY_CARE_PROVIDER_SITE_OTHER): Payer: Medicare Other | Admitting: Interventional Cardiology

## 2013-11-14 ENCOUNTER — Ambulatory Visit (HOSPITAL_COMMUNITY): Payer: Medicare Other

## 2013-11-14 ENCOUNTER — Encounter: Payer: Self-pay | Admitting: Interventional Cardiology

## 2013-11-14 VITALS — BP 155/63 | HR 52 | Ht 61.5 in | Wt 110.0 lb

## 2013-11-14 DIAGNOSIS — Z7901 Long term (current) use of anticoagulants: Secondary | ICD-10-CM

## 2013-11-14 DIAGNOSIS — I5032 Chronic diastolic (congestive) heart failure: Secondary | ICD-10-CM

## 2013-11-14 DIAGNOSIS — I498 Other specified cardiac arrhythmias: Secondary | ICD-10-CM

## 2013-11-14 DIAGNOSIS — I2510 Atherosclerotic heart disease of native coronary artery without angina pectoris: Secondary | ICD-10-CM | POA: Diagnosis not present

## 2013-11-14 DIAGNOSIS — R001 Bradycardia, unspecified: Secondary | ICD-10-CM

## 2013-11-14 DIAGNOSIS — E441 Mild protein-calorie malnutrition: Secondary | ICD-10-CM

## 2013-11-14 DIAGNOSIS — Z5181 Encounter for therapeutic drug level monitoring: Secondary | ICD-10-CM

## 2013-11-14 DIAGNOSIS — I82409 Acute embolism and thrombosis of unspecified deep veins of unspecified lower extremity: Secondary | ICD-10-CM

## 2013-11-14 DIAGNOSIS — I2789 Other specified pulmonary heart diseases: Secondary | ICD-10-CM

## 2013-11-14 DIAGNOSIS — Z951 Presence of aortocoronary bypass graft: Secondary | ICD-10-CM

## 2013-11-14 DIAGNOSIS — I272 Pulmonary hypertension, unspecified: Secondary | ICD-10-CM

## 2013-11-14 MED ORDER — SPIRONOLACTONE 25 MG PO TABS: 25.0000 mg | ORAL_TABLET | Freq: Every day | ORAL | Status: DC

## 2013-11-14 NOTE — Progress Notes (Signed)
Patient ID: Maria [name], female   DOB: [birth year], 80 y.o.   MRN: [id number]    [address], [address] [place], [place]  [address] Phone: [phone] Fax:  [phone]  Date:  11/14/2013   ID:  Maria [name], DOB 30-Mar-1924, MRN [id number]  PCP:  Mathews Argyle, MD   ASSESSMENT:  1. Chronic diastolic heart failure, rule out acute component versus development of systolic heart failure secondary to graft failure 2. Right heart failure secondary to pulmonary hypertension. Concerned about possible pulmonary emboli. Rule out left ventricular systolic dysfunction. Patient is already anticoagulated. No acute change in symptomatology over the past 3-4 weeks. 3. Coronary artery disease status post coronary bypass surgery 4. Bradycardia slightly improved off amiodarone 5. Recent DVT currently on anticoagulation therapy 6. Nausea and anorexia  PLAN:  1. 2-D Doppler echocardiogram 2. Wean and discontinue metoprolol spell facilitate heart rate response 3. Increase oral lactone to 25 mg daily 4. Bmet on office return appointment in 2 weeks 5. Continue current anticoagulation regimen 6. Increase physical activity 7. Office appointment in 2 weeks    SUBJECTIVE: Maria [name] is a 80 y.o. female who continues to have poor appetite and nausea. There is dyspnea on exertion. She now notices slight increase in lower extremity swelling. She denies palpitations. There is no orthopnea. There is discomfort in the right lower extremity which is the leg where a DVT was identified. She has had mild chest discomfort. No prolonged episodes. No prolonged palpitations. She is physically inactive. She seems depressed.   Wt Readings from Last 3 Encounters:  11/14/13 110 lb (49.896 kg)  11/08/13 103 lb (46.72 kg)  11/07/13 112 lb (50.803 kg)     Past Medical History  Diagnosis Date  . Osteoporosis   . Celiac disease   . Celiac disease   . Carotid artery occlusion     right carotid  stenosis 40-60%, 4/08, neg for stenosis repeat study 11/11  . Depression   . Presbycusis   . Macular degeneration   . Urethral stricture   . Moderate aortic insufficiency   . Moderate mitral regurgitation by prior echocardiogram     echo 9/13   . Thyroid nodule     260m, no change 11/11  . RLS (restless legs syndrome)   . Vitamin D deficiency   . Heart murmur   . Pulmonary hypertension   . Hypertension     takes Metoprolol daily  . Atrial fibrillation   . Coronary artery disease   . CHF (congestive heart failure)     takes Lasix daily  . MVP (mitral valve prolapse)   . Moderate obstructive sleep apnea     uses CPAP;sleep study about 4-563monthago  . Hearing loss     both ears  . Numbness     left arm  . Joint swelling   . GERD (gastroesophageal reflux disease)     takes Omeprazole daily  . Gastric ulcer     6-60m39monthgo  . GI bleeding   . Constipation   . Diarrhea   . Hemorrhoids   . Urinary retention   . Urinary anastomotic stricture   . Hypothyroidism     takes Synthroid daily as result of AMiodarone  . Hot flashes     takes ZOloft 3 times a week  . Insomnia     takes Melatonin nightly  . Restless leg   . Peripheral edema     left  . DVT (deep venous thrombosis)  Current Outpatient Prescriptions  Medication Sig Dispense Refill  . acetaminophen (TYLENOL) 325 MG tablet Take 650 mg by mouth every 6 (six) hours as needed.      . docusate sodium (COLACE) 100 MG capsule Take 100 mg by mouth daily as needed.       Marland Kitchen levothyroxine (SYNTHROID, LEVOTHROID) 50 MCG tablet Take 50 mcg by mouth daily before breakfast.      . magnesium hydroxide (MILK OF MAGNESIA) 400 MG/5ML suspension Take 5 mLs by mouth daily as needed for mild constipation.       . metoprolol succinate (TOPROL-XL) 25 MG 24 hr tablet Take 25 mg by mouth every other day. Take with or immediately following a meal.      . omeprazole (PRILOSEC) 20 MG capsule Take 20 mg by mouth every other day.       .  Rivaroxaban (XARELTO) 20 MG TABS tablet Take 1 tablet (20 mg total) by mouth daily with supper. Start January 3rd.  30 tablet  5  . sertraline (ZOLOFT) 50 MG tablet Take 50 mg by mouth every other day.      . spironolactone (ALDACTONE) 25 MG tablet Take 1 tablet (25 mg total) by mouth every other day.  15 tablet  11  . temazepam (RESTORIL) 7.5 MG capsule Take 1 capsule (7.5 mg total) by mouth at bedtime.  30 capsule  0   No current facility-administered medications for this visit.    Allergies:    Allergies  Allergen Reactions  . Amoxicillin Other (See Comments)    Nausea, dizziness  . Codeine Nausea And Vomiting  . Fosamax [Alendronate Sodium] Other (See Comments)    Jaw pain  . Gluten Meal Other (See Comments)    Celiac Disease  . Hctz [Hydrochlorothiazide] Other (See Comments)    Hyponatremia, GI upset  . Tetanus Toxoids Other (See Comments)    Bad local reaction    Social History:  The patient  reports that she has never smoked. She has never used smokeless tobacco. She reports that she does not drink alcohol or use illicit drugs.   ROS:  Please see the history of present illness.   Poor appetite, nausea, no abdominal discomfort, not sleeping well. Has not had syncope. She feels unsteady on her feet. No transient neurological complaints. No palpitations   All other systems reviewed and negative.   OBJECTIVE: VS:  BP 155/63  Pulse 52  Ht 5' 1.5" (1.562 m)  Wt 110 lb (49.896 kg)  BMI 20.45 kg/m2 Well nourished, well developed, in no acute distress, appears well HEENT: normal Neck: JVD marked elevation tan-yellow the jaw with the patient sitting at 90. Carotid bruit 2+ with no bruits  Cardiac:  normal S1, S2; RRR; 8-1/1 systolic murmur at the apex. 9-1/4 diastolic murmur at left midsternal border  Lungs:  clear to auscultation bilaterally, no wheezing, rhonchi or rales Abd: soft, nontender, no hepatomegaly Ext: Edema 2-3+ right lower extremity edema and 1+ left lower  extremity edema. Pulses 2+ bilateral Skin: warm and dry Neuro:  CNs 2-12 intact, no focal abnormalities noted  EKG:  Not repeated       Signed, Illene Labrador III, MD 11/14/2013 11:46 AM

## 2013-11-14 NOTE — Patient Instructions (Signed)
Your physician has recommended you make the following change in your medication:  1) Discontinue Metoprolol. Take 1/2 tablet every other day for 1 week and than stop. 2)  Increase Spironolactone to 34m daily  Your physician recommends that you return for lab work in: 2 weeks (Bmet)  Your physician recommends that you schedule a follow-up appointment in: 2 weeks  Your physician has requested that you have an echocardiogram. Echocardiography is a painless test that uses sound waves to create images of your heart. It provides your doctor with information about the size and shape of your heart and how well your heart's chambers and valves are working. This procedure takes approximately one hour. There are no restrictions for this procedure.  2 Gram Low Sodium Diet A 2 gram sodium diet restricts the amount of sodium in the diet to no more than 2 g or 2000 mg daily. Limiting the amount of sodium is often used to help lower blood pressure. It is important if you have heart, liver, or kidney problems. Many foods contain sodium for flavor and sometimes as a preservative. When the amount of sodium in a diet needs to be low, it is important to know what to look for when choosing foods and drinks. The following includes some information and guidelines to help make it easier for you to adapt to a low sodium diet. QUICK TIPS  Do not add salt to food.  Avoid convenience items and fast food.  Choose unsalted snack foods.  Buy lower sodium products, often labeled as "lower sodium" or "no salt added."  Check food labels to learn how much sodium is in 1 serving.  When eating at a restaurant, ask that your food be prepared with less salt or none, if possible. READING FOOD LABELS FOR SODIUM INFORMATION The nutrition facts label is a good place to find how much sodium is in foods. Look for products with no more than 500 to 600 mg of sodium per meal and no more than 150 mg per serving. Remember that 2 g = 2000  mg. The food label may also list foods as:  Sodium-free: Less than 5 mg in a serving.  Very low sodium: 35 mg or less in a serving.  Low-sodium: 140 mg or less in a serving.  Light in sodium: 50% less sodium in a serving. For example, if a food that usually has 300 mg of sodium is changed to become light in sodium, it will have 150 mg of sodium.  Reduced sodium: 25% less sodium in a serving. For example, if a food that usually has 400 mg of sodium is changed to reduced sodium, it will have 300 mg of sodium. CHOOSING FOODS Grains  Avoid: Salted crackers and snack items. Some cereals, including instant hot cereals. Bread stuffing and biscuit mixes. Seasoned rice or pasta mixes.  Choose: Unsalted snack items. Low-sodium cereals, oats, puffed wheat and rice, shredded wheat. English muffins and bread. Pasta. Meats  Avoid: Salted, canned, smoked, spiced, pickled meats, including fish and poultry. Bacon, ham, sausage, cold cuts, hot dogs, anchovies.  Choose: Low-sodium canned tuna and salmon. Fresh or frozen meat, poultry, and fish. Dairy  Avoid: Processed cheese and spreads. Cottage cheese. Buttermilk and condensed milk. Regular cheese.  Choose: Milk. Low-sodium cottage cheese. Yogurt. Sour cream. Low-sodium cheese. Fruits and Vegetables  Avoid: Regular canned vegetables. Regular canned tomato sauce and paste. Frozen vegetables in sauces. Olives. PAngie [name] Relishes. Sauerkraut.  Choose: Low-sodium canned vegetables. Low-sodium tomato sauce and paste. Frozen  or fresh vegetables. Fresh and frozen fruit. Condiments  Avoid: Canned and packaged gravies. Worcestershire sauce. Tartar sauce. Barbecue sauce. Soy sauce. Steak sauce. Ketchup. Onion, garlic, and table salt. Meat flavorings and tenderizers.  Choose: Fresh and dried herbs and spices. Low-sodium varieties of mustard and ketchup. Lemon juice. Tabasco sauce. Horseradish. SAMPLE 2 GRAM SODIUM MEAL PLAN Breakfast / Sodium (mg)  1 cup  low-fat milk / 122 mg  2 slices whole-wheat toast / 270 mg  1 tbs heart-healthy margarine / 153 mg  1 hard-boiled egg / 139 mg  1 small orange / 0 mg Lunch / Sodium (mg)  1 cup raw carrots / 76 mg   cup hummus / 298 mg  1 cup low-fat milk / 143 mg   cup red grapes / 2 mg  1 whole-wheat pita bread / 356 mg Dinner / Sodium (mg)  1 cup whole-wheat pasta / 2 mg  1 cup low-sodium tomato sauce / 73 mg  3 oz lean ground beef / 57 mg  1 small side salad (1 cup raw spinach leaves,  cup cucumber,  cup yellow bell pepper) with 1 tsp olive oil and 1 tsp red wine vinegar / 25 mg Snack / Sodium (mg)  1 container low-fat vanilla yogurt / 107 mg  3 graham cracker squares / 127 mg Nutrient Analysis  Calories: 2033  Protein: 77 g  Carbohydrate: 282 g  Fat: 72 g  Sodium: 1971 mg Document Released: 10/11/2005 Document Revised: 01/03/2012 Document Reviewed: 01/12/2010 [employer] Patient Information 2014 [place].

## 2013-11-16 ENCOUNTER — Ambulatory Visit (HOSPITAL_COMMUNITY): Payer: Medicare Other

## 2013-11-19 ENCOUNTER — Ambulatory Visit: Payer: Medicare Other | Admitting: Interventional Cardiology

## 2013-11-19 ENCOUNTER — Ambulatory Visit (HOSPITAL_COMMUNITY): Payer: Medicare Other

## 2013-11-21 ENCOUNTER — Ambulatory Visit (HOSPITAL_COMMUNITY): Payer: Medicare Other

## 2013-11-23 ENCOUNTER — Encounter: Payer: Self-pay | Admitting: Internal Medicine

## 2013-11-23 ENCOUNTER — Ambulatory Visit (HOSPITAL_COMMUNITY): Payer: Medicare Other | Attending: Internal Medicine | Admitting: Radiology

## 2013-11-23 ENCOUNTER — Ambulatory Visit (HOSPITAL_COMMUNITY): Payer: Medicare Other

## 2013-11-23 DIAGNOSIS — I059 Rheumatic mitral valve disease, unspecified: Secondary | ICD-10-CM | POA: Insufficient documentation

## 2013-11-23 DIAGNOSIS — Z86718 Personal history of other venous thrombosis and embolism: Secondary | ICD-10-CM | POA: Insufficient documentation

## 2013-11-23 DIAGNOSIS — I498 Other specified cardiac arrhythmias: Secondary | ICD-10-CM | POA: Diagnosis not present

## 2013-11-23 DIAGNOSIS — I359 Nonrheumatic aortic valve disorder, unspecified: Secondary | ICD-10-CM | POA: Diagnosis not present

## 2013-11-23 DIAGNOSIS — I5032 Chronic diastolic (congestive) heart failure: Secondary | ICD-10-CM

## 2013-11-23 DIAGNOSIS — R609 Edema, unspecified: Secondary | ICD-10-CM | POA: Diagnosis not present

## 2013-11-23 DIAGNOSIS — I2510 Atherosclerotic heart disease of native coronary artery without angina pectoris: Secondary | ICD-10-CM | POA: Insufficient documentation

## 2013-11-23 DIAGNOSIS — I079 Rheumatic tricuspid valve disease, unspecified: Secondary | ICD-10-CM | POA: Diagnosis not present

## 2013-11-23 DIAGNOSIS — I270 Primary pulmonary hypertension: Secondary | ICD-10-CM | POA: Insufficient documentation

## 2013-11-23 DIAGNOSIS — I509 Heart failure, unspecified: Secondary | ICD-10-CM | POA: Diagnosis not present

## 2013-11-23 DIAGNOSIS — I10 Essential (primary) hypertension: Secondary | ICD-10-CM | POA: Diagnosis not present

## 2013-11-23 NOTE — Progress Notes (Signed)
Echocardiogram performed.  

## 2013-11-26 ENCOUNTER — Ambulatory Visit (HOSPITAL_COMMUNITY): Payer: Medicare Other

## 2013-11-26 ENCOUNTER — Telehealth: Payer: Self-pay

## 2013-11-26 NOTE — Telephone Encounter (Signed)
Message copied by Lamar Laundry on Mon Nov 26, 2013  2:11 PM ------      Message from: Daneen Schick      Created: Fri Nov 23, 2013  5:27 PM       Moderate MR, mod to severe pulmonary hypertension, normal LV function. No sign of bypass failure. Elevated pulmonary pressure is significant.  Have to treat medically with anticoagulation and diuresis. ------

## 2013-11-26 NOTE — Telephone Encounter (Signed)
pt husband given results of echo.Moderate MR, mod to severe pulmonary hypertension, normal LV function. No sign of bypass failure. Elevated pulmonary pressure is significant.  Have to treat medically with anticoagulation and diuresis.pt husband verbalized understanding

## 2013-11-27 ENCOUNTER — Other Ambulatory Visit: Payer: Self-pay | Admitting: *Deleted

## 2013-11-27 DIAGNOSIS — I2510 Atherosclerotic heart disease of native coronary artery without angina pectoris: Secondary | ICD-10-CM

## 2013-11-28 ENCOUNTER — Ambulatory Visit (HOSPITAL_COMMUNITY): Payer: Medicare Other

## 2013-11-28 ENCOUNTER — Telehealth: Payer: Self-pay | Admitting: Interventional Cardiology

## 2013-11-28 NOTE — Telephone Encounter (Signed)
RETURNED PT HUSBAND CALL.HE WANTED VERIFY PT LAB APPT.ADV HIM THAT PT LAB COULD BE DRAWN TOMORROW AT VISIT.PT DOES NOT NEED TO COME IN ON 12/03/13 FOR LABS.PT HUSBAND

## 2013-11-28 NOTE — Telephone Encounter (Signed)
New message  Please call patients husband back. Dr Donna Christen [phone].

## 2013-11-29 ENCOUNTER — Other Ambulatory Visit: Payer: Medicare Other

## 2013-11-29 ENCOUNTER — Ambulatory Visit (INDEPENDENT_AMBULATORY_CARE_PROVIDER_SITE_OTHER): Payer: Self-pay | Admitting: Cardiothoracic Surgery

## 2013-11-29 ENCOUNTER — Ambulatory Visit
Admission: RE | Admit: 2013-11-29 | Discharge: 2013-11-29 | Disposition: A | Discharge status: Home / Self Care | Payer: Medicare Other | Source: Ambulatory Visit | Attending: Cardiothoracic Surgery | Admitting: Cardiothoracic Surgery

## 2013-11-29 ENCOUNTER — Encounter: Payer: Self-pay | Admitting: Interventional Cardiology

## 2013-11-29 ENCOUNTER — Ambulatory Visit (INDEPENDENT_AMBULATORY_CARE_PROVIDER_SITE_OTHER): Payer: Medicare Other | Admitting: Interventional Cardiology

## 2013-11-29 ENCOUNTER — Encounter: Payer: Self-pay | Admitting: Cardiothoracic Surgery

## 2013-11-29 ENCOUNTER — Other Ambulatory Visit: Payer: Self-pay

## 2013-11-29 VITALS — BP 132/62 | HR 76 | Ht 61.5 in | Wt 107.0 lb

## 2013-11-29 VITALS — BP 143/69 | HR 75 | Resp 16 | Ht 61.5 in | Wt 99.0 lb

## 2013-11-29 DIAGNOSIS — Z5181 Encounter for therapeutic drug level monitoring: Secondary | ICD-10-CM | POA: Diagnosis not present

## 2013-11-29 DIAGNOSIS — I5032 Chronic diastolic (congestive) heart failure: Secondary | ICD-10-CM

## 2013-11-29 DIAGNOSIS — I10 Essential (primary) hypertension: Secondary | ICD-10-CM

## 2013-11-29 DIAGNOSIS — Z951 Presence of aortocoronary bypass graft: Secondary | ICD-10-CM

## 2013-11-29 DIAGNOSIS — Z8709 Personal history of other diseases of the respiratory system: Secondary | ICD-10-CM

## 2013-11-29 DIAGNOSIS — Z79899 Other long term (current) drug therapy: Secondary | ICD-10-CM | POA: Diagnosis not present

## 2013-11-29 DIAGNOSIS — I2510 Atherosclerotic heart disease of native coronary artery without angina pectoris: Secondary | ICD-10-CM | POA: Diagnosis not present

## 2013-11-29 DIAGNOSIS — Z8679 Personal history of other diseases of the circulatory system: Secondary | ICD-10-CM

## 2013-11-29 DIAGNOSIS — Z7901 Long term (current) use of anticoagulants: Secondary | ICD-10-CM

## 2013-11-29 DIAGNOSIS — J90 Pleural effusion, not elsewhere classified: Secondary | ICD-10-CM | POA: Diagnosis not present

## 2013-11-29 DIAGNOSIS — I82409 Acute embolism and thrombosis of unspecified deep veins of unspecified lower extremity: Secondary | ICD-10-CM

## 2013-11-29 NOTE — Addendum Note (Signed)
Addended by: Nyoka Lint on: 11/29/2013 05:23 PM   Modules accepted: Orders

## 2013-11-29 NOTE — Progress Notes (Signed)
[facility] Medical Record #[id number] Date of Birth: 1924-08-16  Lesleigh Noe, MD Ginette Otto, MD  Chief Complaint:   PostOp Follow Up Visit 08/28/2013  PREOPERATIVE DIAGNOSES: Severe 2-vessel coronary artery disease with  unstable angina, history of atrial fibrillation, history of aortic  insufficiency, and mitral insufficiency.  POSTOPERATIVE DIAGNOSES: Severe 2-vessel coronary artery disease with  unstable angina, history of atrial fibrillation, history of aortic  insufficiency, and mitral insufficiency.  SURGICAL PROCEDURE: Coronary artery bypass grafting x2 with left  internal mammary to the left anterior descending coronary artery, and  reverse saphenous vein graft to the circumflex coronary artery, and  placement of a Accucare 40 mm left atrial clip with right leg endo vein  harvesting.  SURGEON: Sheliah Plane, MD   History of Present Illness:     Patient returns to the office today after coronary artery bypass grafting with atrial clip in anterior done two  month ago. She developed increasing pleural effusions postoperatively requiring readmission and bilateral thoracentesis. S Her respiratory status has been stable. She notes that the last several weeks she's been very limited by nausea and very poor appetite. She was noted in cardiology office yesterday to have a low heart rate, because of this and the nausea her amiodarone has been stopped. Followup echo shows evidence of preserved LV function, moderate TR and MR and evidence of pulmonary hypertension.  Patient returns today with a followup chest x-ray, which is much improved.  Her nausea has improved, she still complains of discomfort in her lower legs especially the right. She remains on Zarleto for DVT  History  Smoking status  . Never Smoker   Smokeless tobacco  . Never Used       Allergies  Allergen Reactions  . Amoxicillin Other (See Comments)     Nausea, dizziness  . Codeine Nausea And Vomiting  . Fosamax [Alendronate Sodium] Other (See Comments)    Jaw pain  . Gluten Meal Other (See Comments)    Celiac Disease  . Hctz [Hydrochlorothiazide] Other (See Comments)    Hyponatremia, GI upset  . Tetanus Toxoids Other (See Comments)    Bad local reaction    Current Outpatient Prescriptions  Medication Sig Dispense Refill  . acetaminophen (TYLENOL) 325 MG tablet Take 650 mg by mouth every 6 (six) hours as needed.      . docusate sodium (COLACE) 100 MG capsule Take 100 mg by mouth daily as needed.       Marland Kitchen levothyroxine (SYNTHROID, LEVOTHROID) 50 MCG tablet Take 50 mcg by mouth daily before breakfast.      . LORazepam (ATIVAN) 0.5 MG tablet Take 0.5 mg by mouth at bedtime.      . magnesium hydroxide (MILK OF MAGNESIA) 400 MG/5ML suspension Take 5 mLs by mouth daily as needed for mild constipation.       Marland Kitchen omeprazole (PRILOSEC) 20 MG capsule Take 20 mg by mouth every other day.       . Rivaroxaban (XARELTO) 20 MG TABS tablet Take 1 tablet (20 mg total) by mouth daily with supper. Start January 3rd.  30 tablet  5  . sertraline (ZOLOFT) 50 MG tablet Take 50 mg by mouth every other day.      . spironolactone (ALDACTONE) 25 MG tablet Take 1 tablet (25 mg total) by mouth daily.  No current facility-administered medications for this visit.       Physical Exam: BP 143/69  Pulse 75  Resp 16  Ht 5' 1.5" (1.562 m)  Wt 99 lb (44.906 kg)  BMI 18.41 kg/m2  SpO2 98% Pulses regular 74 General appearance: alert and cooperative Neurologic: intact Heart: regular rate and rhythm, S1, S2 normal, no murmur, click, rub or gallop, I do not appreciate murmur of mitral insufficiency but it is noted on recent echo Lungs:  breath sounds improved  Abdomen: soft, non-tender; bowel sounds normal; no masses,  no organomegaly Extremities: extremities normal, atraumatic, no cyanosis or edema and Homans sign is negative, no sign of DVT left leg,  right leg has mild pedal edema Wound: Sternal incision is stable and well healed    Diagnostic Studies & Laboratory data:         Recent Radiology Findings: Dg Chest 2 View  11/29/2013   CLINICAL DATA:  Follow-up cardiac surgery three months ago.  EXAM: CHEST  2 VIEW  COMPARISON:  DG CHEST 2 VIEW dated 10/17/2013; DG CHEST 1V PORT dated 10/04/2013; DG CHEST 2 VIEW dated 09/15/2013  FINDINGS: There is stable cardiomegaly status post CABG and atrial ligation. The bilateral pleural effusions have nearly completely resolved. The lungs are clear. There is no pneumothorax or edema. The osseous structures appear unchanged. There is a mild scoliosis.  IMPRESSION: Interval near-complete resolution of bilateral pleural effusions. No acute findings demonstrated.   Electronically Signed   By: Roxy Horseman M.D.   On: 11/29/2013 13:55        Recent Labs: Lab Results  Component Value Date   WBC 6.6 10/05/2013   HGB 11.2* 10/05/2013   HCT 32.1* 10/05/2013   PLT 392 10/05/2013   GLUCOSE 87 10/05/2013   CHOL 135 10/04/2013   TRIG 102 10/04/2013   HDL 36* 10/04/2013   LDLCALC 79 10/04/2013   ALT 28 11/05/2013   AST 32 11/05/2013   NA 126* 10/05/2013   K 3.8 10/05/2013   CL 97 10/05/2013   CREATININE 0.63 10/05/2013   BUN 8 10/05/2013   CO2 21 10/05/2013   TSH 4.57 11/05/2013   INR 2.47* 10/03/2013   HGBA1C 5.7* 10/03/2013      Assessment / Plan:    Patient appears improved since I saw her 2 weeks ago, her beta blocker and amiodarone have been discontinued, her heart rate has increased, she diuresed well and does not appear fluid overloaded now. Chest x-ray shows clear lung fields bilaterally with almost complete resolution of previous present pleural effusions. She was encouraged to continue with her regular walking schedule, and to maximize her by mouth intake of protein and calories. Plan to see her back in one month     Giulio Bertino B 11/29/2013 2:35 PM

## 2013-11-29 NOTE — Progress Notes (Signed)
Patient ID: Maria [name], female   DOB: [birth year], 80 y.o.   MRN: [id number]    [address], [place]  [address] Phone: [phone] Fax:  [phone]  Date:  11/29/2013   ID:  Maria [name], DOB 31-Aug-1924, MRN [id number]  PCP:  Mathews Argyle, MD   ASSESSMENT:  1. Coronary atherosclerosis, no evidence of angina. Recent coronary artery bypass grafting 2. Atrial fibrillation has not recurred off amiodarone 3. diastolic heart failure, markedly improved 4. Chronic anticoagulation status post pulmonary emboli.   PLAN:  1. Basic metabolic panel today. Adjustment in Aldactone dose depending upon clinical and laboratory status 2. Six-week followup   SUBJECTIVE: Maria [name] is a 80 y.o. female who is stronger and denies dyspnea. Worse from the swelling has resolved. Appetite has improved. Chest x-ray done today by Dr. Servando Snare reveals resolution of pleural effusions. She denies palpitations. No syncope. Denies chills and fever. Energy and overall strength are markedly improved.   Wt Readings from Last 3 Encounters:  11/29/13 107 lb (48.535 kg)  11/29/13 99 lb (44.906 kg)  11/14/13 110 lb (49.896 kg)     Past Medical History  Diagnosis Date  . Osteoporosis   . Celiac disease   . Celiac disease   . Carotid artery occlusion     right carotid stenosis 40-60%, 4/08, neg for stenosis repeat study 11/11  . Depression   . Presbycusis   . Macular degeneration   . Urethral stricture   . Moderate aortic insufficiency   . Moderate mitral regurgitation by prior echocardiogram     echo 9/13   . Thyroid nodule     67m, no change 11/11  . RLS (restless legs syndrome)   . Vitamin D deficiency   . Heart murmur   . Pulmonary hypertension   . Hypertension     takes Metoprolol daily  . Atrial fibrillation   . Coronary artery disease   . CHF (congestive heart failure)     takes Lasix daily  . MVP (mitral valve prolapse)   . Moderate  obstructive sleep apnea     uses CPAP;sleep study about 4-527monthago  . Hearing loss     both ears  . Numbness     left arm  . Joint swelling   . GERD (gastroesophageal reflux disease)     takes Omeprazole daily  . Gastric ulcer     6-53m73monthgo  . GI bleeding   . Constipation   . Diarrhea   . Hemorrhoids   . Urinary retention   . Urinary anastomotic stricture   . Hypothyroidism     takes Synthroid daily as result of AMiodarone  . Hot flashes     takes ZOloft 3 times a week  . Insomnia     takes Melatonin nightly  . Restless leg   . Peripheral edema     left  . DVT (deep venous thrombosis)     Current Outpatient Prescriptions  Medication Sig Dispense Refill  . acetaminophen (TYLENOL) 325 MG tablet Take 650 mg by mouth every 6 (six) hours as needed.      . docusate sodium (COLACE) 100 MG capsule Take 100 mg by mouth daily as needed.       . lMarland Kitchenvothyroxine (SYNTHROID, LEVOTHROID) 50 MCG tablet Take 50 mcg by mouth daily before breakfast.      . LORazepam (ATIVAN) 0.5 MG tablet Take 0.5 mg by mouth at bedtime.      .Marland Kitchen  magnesium hydroxide (MILK OF MAGNESIA) 400 MG/5ML suspension Take 5 mLs by mouth daily as needed for mild constipation.       . MULTIPLE VITAMIN PO Take 1 tablet by mouth daily.      Marland Kitchen omeprazole (PRILOSEC) 20 MG capsule Take 20 mg by mouth every other day.       . Rivaroxaban (XARELTO) 20 MG TABS tablet Take 1 tablet (20 mg total) by mouth daily with supper. Start January 3rd.  30 tablet  5  . sertraline (ZOLOFT) 50 MG tablet Take 50 mg by mouth every other day.      . spironolactone (ALDACTONE) 25 MG tablet Take 1 tablet (25 mg total) by mouth daily.       No current facility-administered medications for this visit.    Allergies:    Allergies  Allergen Reactions  . Amoxicillin Other (See Comments)    Nausea, dizziness  . Codeine Nausea And Vomiting  . Fosamax [Alendronate Sodium] Other (See Comments)    Jaw pain  . Gluten Meal Other (See Comments)     Celiac Disease  . Hctz [Hydrochlorothiazide] Other (See Comments)    Hyponatremia, GI upset  . Tetanus Toxoids Other (See Comments)    Bad local reaction    Social History:  The patient  reports that she has never smoked. She has never used smokeless tobacco. She reports that she does not drink alcohol or use illicit drugs.   ROS:  Please see the history of present illness.   All other systems reviewed and negative.   OBJECTIVE: VS:  BP 132/62  Pulse 76  Ht 5' 1.5" (1.562 m)  Wt 107 lb (48.535 kg)  BMI 19.89 kg/m2 Well nourished, well developed, in no acute distress, appearing healthier skin color is normal  HEENT: normal Neck: JVD  Flat neck veins, a marked improvement compared to the prior exam. Carotid bruit absent  Cardiac:  normal S1, S2; RRR; no murmur Lungs:  clear to auscultation bilaterally, no wheezing, rhonchi or rales Abd: soft, nontender, no hepatomegaly Ext: Edema edema has resolved. Pulses 2+  Skin: warm and dry Neuro:  CNs 2-12 intact, no focal abnormalities noted  EKG:  Not repeated       Signed, Illene Labrador III, MD 11/29/2013 4:33 PM

## 2013-11-29 NOTE — Patient Instructions (Addendum)
Your physician recommends that you continue on your current medications as directed. Please refer to the Current Medication list given to you today.  Lab Today B[facility]  You have a follow up appt scheduled for 01/23/14 @ 2:45pm

## 2013-11-30 ENCOUNTER — Other Ambulatory Visit: Payer: Self-pay

## 2013-11-30 ENCOUNTER — Ambulatory Visit (HOSPITAL_COMMUNITY): Payer: Medicare Other

## 2013-11-30 DIAGNOSIS — I5032 Chronic diastolic (congestive) heart failure: Secondary | ICD-10-CM

## 2013-11-30 MED ORDER — SPIRONOLACTONE 25 MG PO TABS: 25.0000 mg | ORAL_TABLET | Freq: Every day | ORAL | Status: DC

## 2013-11-30 MED ORDER — LORAZEPAM 0.5 MG PO TABS: 0.5000 mg | ORAL_TABLET | Freq: Every day | ORAL | Status: DC

## 2013-11-30 MED ORDER — LEVOTHYROXINE SODIUM 50 MCG PO TABS: 50.0000 ug | ORAL_TABLET | Freq: Every day | ORAL | Status: DC

## 2013-12-03 ENCOUNTER — Ambulatory Visit (HOSPITAL_COMMUNITY): Payer: Medicare Other

## 2013-12-03 ENCOUNTER — Ambulatory Visit: Payer: Medicare Other | Admitting: Interventional Cardiology

## 2013-12-03 LAB — BASIC METABOLIC PANEL
BUN: 10 mg/dL (ref 6–23)
CHLORIDE: 96 meq/L (ref 96–112)
CO2: 25 meq/L (ref 19–32)
Calcium: 9.4 mg/dL (ref 8.4–10.5)
Creatinine, Ser: 0.8 mg/dL (ref 0.4–1.2)
GFR: 74.13 mL/min (ref >60.00)
GLUCOSE: 95 mg/dL (ref 70–99)
POTASSIUM: 5.1 meq/L (ref 3.5–5.1)
SODIUM: 125 meq/L — AB (ref 135–145)

## 2013-12-05 ENCOUNTER — Ambulatory Visit (HOSPITAL_COMMUNITY): Payer: Medicare Other

## 2013-12-05 NOTE — Progress Notes (Signed)
Quick Note:  Routed results to Dr. [name] 2/11@9 :00 am/PE ______

## 2013-12-07 ENCOUNTER — Emergency Department (HOSPITAL_BASED_OUTPATIENT_CLINIC_OR_DEPARTMENT_OTHER): Payer: Medicare Other

## 2013-12-07 ENCOUNTER — Encounter (HOSPITAL_BASED_OUTPATIENT_CLINIC_OR_DEPARTMENT_OTHER): Payer: Self-pay | Admitting: Emergency Medicine

## 2013-12-07 ENCOUNTER — Ambulatory Visit (HOSPITAL_COMMUNITY): Payer: Medicare Other

## 2013-12-07 ENCOUNTER — Inpatient Hospital Stay (HOSPITAL_BASED_OUTPATIENT_CLINIC_OR_DEPARTMENT_OTHER)
Admission: EM | Admit: 2013-12-07 | Discharge: 2013-12-12 | DRG: 469 | Disposition: A | Discharge status: Rehabilitation Facility | Payer: Medicare Other | Attending: Internal Medicine | Admitting: Internal Medicine

## 2013-12-07 DIAGNOSIS — I2510 Atherosclerotic heart disease of native coronary artery without angina pectoris: Secondary | ICD-10-CM | POA: Diagnosis present

## 2013-12-07 DIAGNOSIS — E871 Hypo-osmolality and hyponatremia: Secondary | ICD-10-CM | POA: Diagnosis present

## 2013-12-07 DIAGNOSIS — Z86718 Personal history of other venous thrombosis and embolism: Secondary | ICD-10-CM

## 2013-12-07 DIAGNOSIS — I4891 Unspecified atrial fibrillation: Secondary | ICD-10-CM | POA: Diagnosis present

## 2013-12-07 DIAGNOSIS — N390 Urinary tract infection, site not specified: Secondary | ICD-10-CM | POA: Diagnosis present

## 2013-12-07 DIAGNOSIS — N312 Flaccid neuropathic bladder, not elsewhere classified: Secondary | ICD-10-CM | POA: Diagnosis present

## 2013-12-07 DIAGNOSIS — Z951 Presence of aortocoronary bypass graft: Secondary | ICD-10-CM | POA: Diagnosis not present

## 2013-12-07 DIAGNOSIS — F3289 Other specified depressive episodes: Secondary | ICD-10-CM | POA: Diagnosis present

## 2013-12-07 DIAGNOSIS — E039 Hypothyroidism, unspecified: Secondary | ICD-10-CM | POA: Diagnosis present

## 2013-12-07 DIAGNOSIS — Z79899 Other long term (current) drug therapy: Secondary | ICD-10-CM | POA: Diagnosis not present

## 2013-12-07 DIAGNOSIS — S72011A Unspecified intracapsular fracture of right femur, initial encounter for closed fracture: Secondary | ICD-10-CM

## 2013-12-07 DIAGNOSIS — Z7901 Long term (current) use of anticoagulants: Secondary | ICD-10-CM

## 2013-12-07 DIAGNOSIS — E236 Other disorders of pituitary gland: Secondary | ICD-10-CM | POA: Diagnosis not present

## 2013-12-07 DIAGNOSIS — Z5189 Encounter for other specified aftercare: Secondary | ICD-10-CM | POA: Diagnosis not present

## 2013-12-07 DIAGNOSIS — W19XXXA Unspecified fall, initial encounter: Secondary | ICD-10-CM

## 2013-12-07 DIAGNOSIS — F0390 Unspecified dementia without behavioral disturbance: Secondary | ICD-10-CM | POA: Diagnosis present

## 2013-12-07 DIAGNOSIS — S72001A Fracture of unspecified part of neck of right femur, initial encounter for closed fracture: Secondary | ICD-10-CM

## 2013-12-07 DIAGNOSIS — K219 Gastro-esophageal reflux disease without esophagitis: Secondary | ICD-10-CM | POA: Diagnosis present

## 2013-12-07 DIAGNOSIS — I2789 Other specified pulmonary heart diseases: Secondary | ICD-10-CM | POA: Diagnosis not present

## 2013-12-07 DIAGNOSIS — I10 Essential (primary) hypertension: Secondary | ICD-10-CM

## 2013-12-07 DIAGNOSIS — S72033A Displaced midcervical fracture of unspecified femur, initial encounter for closed fracture: Secondary | ICD-10-CM | POA: Diagnosis not present

## 2013-12-07 DIAGNOSIS — I272 Pulmonary hypertension, unspecified: Secondary | ICD-10-CM

## 2013-12-07 DIAGNOSIS — I059 Rheumatic mitral valve disease, unspecified: Secondary | ICD-10-CM | POA: Diagnosis present

## 2013-12-07 DIAGNOSIS — M810 Age-related osteoporosis without current pathological fracture: Secondary | ICD-10-CM | POA: Diagnosis present

## 2013-12-07 DIAGNOSIS — S72009A Fracture of unspecified part of neck of unspecified femur, initial encounter for closed fracture: Secondary | ICD-10-CM | POA: Diagnosis not present

## 2013-12-07 DIAGNOSIS — D649 Anemia, unspecified: Secondary | ICD-10-CM | POA: Diagnosis not present

## 2013-12-07 DIAGNOSIS — Z96649 Presence of unspecified artificial hip joint: Secondary | ICD-10-CM | POA: Diagnosis not present

## 2013-12-07 DIAGNOSIS — D638 Anemia in other chronic diseases classified elsewhere: Secondary | ICD-10-CM | POA: Diagnosis present

## 2013-12-07 DIAGNOSIS — I509 Heart failure, unspecified: Secondary | ICD-10-CM | POA: Diagnosis not present

## 2013-12-07 DIAGNOSIS — S0003XA Contusion of scalp, initial encounter: Secondary | ICD-10-CM | POA: Diagnosis not present

## 2013-12-07 DIAGNOSIS — I5032 Chronic diastolic (congestive) heart failure: Secondary | ICD-10-CM | POA: Diagnosis not present

## 2013-12-07 DIAGNOSIS — E41 Nutritional marasmus: Secondary | ICD-10-CM | POA: Diagnosis not present

## 2013-12-07 DIAGNOSIS — I480 Paroxysmal atrial fibrillation: Secondary | ICD-10-CM | POA: Diagnosis present

## 2013-12-07 DIAGNOSIS — S199XXA Unspecified injury of neck, initial encounter: Secondary | ICD-10-CM | POA: Diagnosis not present

## 2013-12-07 DIAGNOSIS — I82409 Acute embolism and thrombosis of unspecified deep veins of unspecified lower extremity: Secondary | ICD-10-CM | POA: Diagnosis present

## 2013-12-07 DIAGNOSIS — Z681 Body mass index (BMI) 19 or less, adult: Secondary | ICD-10-CM

## 2013-12-07 DIAGNOSIS — T502X5A Adverse effect of carbonic-anhydrase inhibitors, benzothiadiazides and other diuretics, initial encounter: Secondary | ICD-10-CM | POA: Diagnosis present

## 2013-12-07 DIAGNOSIS — G4700 Insomnia, unspecified: Secondary | ICD-10-CM | POA: Diagnosis present

## 2013-12-07 DIAGNOSIS — S1093XA Contusion of unspecified part of neck, initial encounter: Secondary | ICD-10-CM | POA: Diagnosis not present

## 2013-12-07 DIAGNOSIS — F329 Major depressive disorder, single episode, unspecified: Secondary | ICD-10-CM | POA: Diagnosis present

## 2013-12-07 DIAGNOSIS — S0083XA Contusion of other part of head, initial encounter: Secondary | ICD-10-CM | POA: Diagnosis not present

## 2013-12-07 DIAGNOSIS — Z5181 Encounter for therapeutic drug level monitoring: Secondary | ICD-10-CM

## 2013-12-07 DIAGNOSIS — W010XXA Fall on same level from slipping, tripping and stumbling without subsequent striking against object, initial encounter: Secondary | ICD-10-CM | POA: Diagnosis not present

## 2013-12-07 DIAGNOSIS — D62 Acute posthemorrhagic anemia: Secondary | ICD-10-CM | POA: Diagnosis not present

## 2013-12-07 DIAGNOSIS — S0993XA Unspecified injury of face, initial encounter: Secondary | ICD-10-CM | POA: Diagnosis not present

## 2013-12-07 DIAGNOSIS — Z471 Aftercare following joint replacement surgery: Secondary | ICD-10-CM | POA: Diagnosis not present

## 2013-12-07 DIAGNOSIS — S298XXA Other specified injuries of thorax, initial encounter: Secondary | ICD-10-CM | POA: Diagnosis not present

## 2013-12-07 DIAGNOSIS — M25559 Pain in unspecified hip: Secondary | ICD-10-CM | POA: Diagnosis not present

## 2013-12-07 DIAGNOSIS — R636 Underweight: Secondary | ICD-10-CM | POA: Diagnosis present

## 2013-12-07 DIAGNOSIS — Z4889 Encounter for other specified surgical aftercare: Secondary | ICD-10-CM | POA: Diagnosis not present

## 2013-12-07 HISTORY — DX: Flaccid neuropathic bladder, not elsewhere classified: N31.2

## 2013-12-07 LAB — URINALYSIS, ROUTINE W REFLEX MICROSCOPIC
BILIRUBIN URINE: NEGATIVE
Glucose, UA: NEGATIVE mg/dL
Ketones, ur: NEGATIVE mg/dL
Nitrite: POSITIVE — AB
PH: 6.5 (ref 5.0–8.0)
Protein, ur: NEGATIVE mg/dL
Specific Gravity, Urine: 1.009 (ref 1.005–1.030)
Urobilinogen, UA: 0.2 mg/dL (ref 0.0–1.0)

## 2013-12-07 LAB — CBC WITH DIFFERENTIAL/PLATELET
Basophils Absolute: 0 10*3/uL (ref 0.0–0.1)
Basophils Relative: 0 % (ref 0–1)
EOS ABS: 0 10*3/uL (ref 0.0–0.7)
EOS PCT: 0 % (ref 0–5)
HCT: 32 % — ABNORMAL LOW (ref 36.0–46.0)
Hemoglobin: 11.1 g/dL — ABNORMAL LOW (ref 12.0–15.0)
Lymphocytes Relative: 14 % (ref 12–46)
Lymphs Abs: 1.5 10*3/uL (ref 0.7–4.0)
MCH: 27.7 pg (ref 26.0–34.0)
MCHC: 34.7 g/dL (ref 30.0–36.0)
MCV: 79.8 fL (ref 78.0–100.0)
MONOS PCT: 4 % (ref 3–12)
Monocytes Absolute: 0.5 10*3/uL (ref 0.1–1.0)
NEUTROS PCT: 81 % — AB (ref 43–77)
Neutro Abs: 8.6 10*3/uL — ABNORMAL HIGH (ref 1.7–7.7)
Platelets: 360 10*3/uL (ref 150–400)
RBC: 4.01 MIL/uL (ref 3.87–5.11)
RDW: 15.7 % — ABNORMAL HIGH (ref 11.5–15.5)
WBC: 10.7 10*3/uL — ABNORMAL HIGH (ref 4.0–10.5)

## 2013-12-07 LAB — BASIC METABOLIC PANEL
BUN: 13 mg/dL (ref 6–23)
CO2: 20 mEq/L (ref 19–32)
CREATININE: 0.7 mg/dL (ref 0.50–1.10)
Calcium: 9.1 mg/dL (ref 8.4–10.5)
Chloride: 89 mEq/L — ABNORMAL LOW (ref 96–112)
GFR calc Af Amer: 88 mL/min — ABNORMAL LOW (ref >90)
GFR, EST NON AFRICAN AMERICAN: 76 mL/min — AB (ref >90)
Glucose, Bld: 97 mg/dL (ref 70–99)
Potassium: 4.5 mEq/L (ref 3.7–5.3)
Sodium: 124 mEq/L — ABNORMAL LOW (ref 137–147)

## 2013-12-07 LAB — URINE MICROSCOPIC-ADD ON

## 2013-12-07 LAB — PROTIME-INR
INR: 1.42 (ref 0.00–1.49)
PROTHROMBIN TIME: 17 s — AB (ref 11.6–15.2)

## 2013-12-07 MED ORDER — ONDANSETRON HCL 4 MG/2ML IJ SOLN
4.0000 mg | Freq: Once | INTRAMUSCULAR | Status: AC
  Administered 2013-12-07: 4 mg via INTRAVENOUS
  Filled 2013-12-07: qty 2

## 2013-12-07 MED ORDER — HYDROCODONE-ACETAMINOPHEN 5-325 MG PO TABS
1.0000 | ORAL_TABLET | ORAL | Status: AC | PRN
  Filled 2013-12-07: qty 2

## 2013-12-07 MED ORDER — FENTANYL CITRATE 0.05 MG/ML IJ SOLN
25.0000 ug | INTRAMUSCULAR | Status: DC | PRN
  Administered 2013-12-07: 25 ug via INTRAVENOUS
  Filled 2013-12-07: qty 2

## 2013-12-07 MED ORDER — ONDANSETRON HCL 4 MG/2ML IJ SOLN
4.0000 mg | Freq: Three times a day (TID) | INTRAMUSCULAR | Status: AC | PRN
  Administered 2013-12-08: 4 mg via INTRAVENOUS
  Filled 2013-12-07: qty 2

## 2013-12-07 NOTE — ED Provider Notes (Signed)
CSN: [id number]     Arrival date & time 12/07/13  1825 History  This chart was scribed for Houston Siren III, * by Donato Schultz, ED Scribe. This patient was seen in room MH07/MH07 and the patient's care was started at 7:47 PM.     Chief Complaint  Patient presents with  . Fall  . Hip Injury      Patient is a 80 y.o. female presenting with fall. The history is provided by the patient and the spouse. No language interpreter was used.  Fall Associated symptoms include headaches. Pertinent negatives include no chest pain, no abdominal pain and no shortness of breath.   HPI Comments: Maria [name] is a 80 y.o. female with a history of hypertension who presents to the Emergency Department complaining of constant right hip and head pain that started today after the patient slipped and fell on hardwood flooring.  The patient states that the patient hit the crown of her head, specifically on the right side, on some furniture.  She denies losing consciousness at the time of the fall or after the fall.  She denies feeling dizzy or lightheaded at the time of the fall.  She states that her headache did not start immediately after the fall.  She denies neck pain, abdominal pain, arthralgias, vomiting, SOB, chest pain, and numbness or tingling in her arms or legs bilaterally.  She lists nausea and tenderness of her right knee as associated symptoms.  The patient's husband states that the patient has fallen three times this week spontaneously after turning or getting up too quickly.  The patient's husband states that she takes Xarelto regularly and had her last dose being last night.  The patient's husband states that the patient has a history of DVT in her right leg.   The patient's cardiologist is Dr. [name] and her PCP is [facility].     Past Medical History  Diagnosis Date  . Osteoporosis   . Celiac disease   . Celiac disease   . Carotid artery occlusion     right carotid stenosis 40-60%, 4/08,  neg for stenosis repeat study 11/11  . Depression   . Presbycusis   . Macular degeneration   . Urethral stricture   . Moderate aortic insufficiency   . Moderate mitral regurgitation by prior echocardiogram     echo 9/13   . Thyroid nodule     55m, no change 11/11  . RLS (restless legs syndrome)   . Vitamin D deficiency   . Heart murmur   . Pulmonary hypertension   . Hypertension     takes Metoprolol daily  . Atrial fibrillation   . Coronary artery disease   . CHF (congestive heart failure)     takes Lasix daily  . MVP (mitral valve prolapse)   . Moderate obstructive sleep apnea     uses CPAP;sleep study about 4-569monthago  . Hearing loss     both ears  . Numbness     left arm  . Joint swelling   . GERD (gastroesophageal reflux disease)     takes Omeprazole daily  . Gastric ulcer     6-54m67monthgo  . GI bleeding   . Constipation   . Diarrhea   . Hemorrhoids   . Urinary retention   . Urinary anastomotic stricture   . Hypothyroidism     takes Synthroid daily as result of AMiodarone  . Hot flashes     takes ZOloft 3 times a  week  . Insomnia     takes Melatonin nightly  . Restless leg   . Peripheral edema     left  . DVT (deep venous thrombosis)    Past Surgical History  Procedure Laterality Date  . Appendectomy    . Tubal ligation    . Tonsillectomy    . Trigger thumb    . Cardiac catheterization  08-06-13  . Tcs    . Bilateral cataract surgery    . Mandible surgery    . Coronary artery bypass graft N/A 08/28/2013    Procedure: CORONARY ARTERY BYPASS GRAFTING (CABG) x2 using right greater saphenous vein and left internal mammary artery. ;  Surgeon: Grace Isaac, MD;  Location: [place];  Service: Open Heart Surgery;  Laterality: N/A;  . Intraoperative transesophageal echocardiogram N/A 08/28/2013    Procedure: INTRAOPERATIVE TRANSESOPHAGEAL ECHOCARDIOGRAM;  Surgeon: Grace Isaac, MD;  Location: [place];  Service: Open Heart Surgery;  Laterality: N/A;    Family History  Problem Relation Age of Onset  . Heart attack Mother   . CVA Mother   . CAD Father   . Colon cancer Father   . Heart attack Father   . Heart attack Brother   . Peripheral vascular disease Brother   . AAA (abdominal aortic aneurysm) Brother    History  Substance Use Topics  . Smoking status: Never Smoker   . Smokeless tobacco: Never Used  . Alcohol Use: No   OB History   Grav Para Term Preterm Abortions TAB SAB Ect Mult Living                 Review of Systems  Respiratory: Negative for shortness of breath.   Cardiovascular: Negative for chest pain.  Gastrointestinal: Positive for nausea. Negative for vomiting and abdominal pain.  Musculoskeletal: Positive for arthralgias (right hip).  Neurological: Positive for headaches. Negative for dizziness and light-headedness.  All other systems reviewed and are negative.      Allergies  Amoxicillin; Codeine; Fosamax; Gluten meal; Hctz; and Tetanus toxoids  Home Medications   Current Outpatient Rx  Name  Route  Sig  Dispense  Refill  . acetaminophen (TYLENOL) 325 MG tablet   Oral   Take 650 mg by mouth every 6 (six) hours as needed.         . docusate sodium (COLACE) 100 MG capsule   Oral   Take 100 mg by mouth daily as needed.          Marland Kitchen levothyroxine (SYNTHROID, LEVOTHROID) 50 MCG tablet   Oral   Take 1 tablet (50 mcg total) by mouth daily before breakfast.   30 tablet   11   . LORazepam (ATIVAN) 0.5 MG tablet   Oral   Take 1 tablet (0.5 mg total) by mouth at bedtime.   30 tablet   0   . magnesium hydroxide (MILK OF MAGNESIA) 400 MG/5ML suspension   Oral   Take 5 mLs by mouth daily as needed for mild constipation.          . MULTIPLE VITAMIN PO   Oral   Take 1 tablet by mouth daily.         Marland Kitchen omeprazole (PRILOSEC) 20 MG capsule   Oral   Take 20 mg by mouth every other day.          . Rivaroxaban (XARELTO) 20 MG TABS tablet   Oral   Take 1 tablet (20 mg total) by mouth  daily with  supper. Start January 3rd.   30 tablet   5   . sertraline (ZOLOFT) 50 MG tablet   Oral   Take 50 mg by mouth every other day.         . spironolactone (ALDACTONE) 25 MG tablet   Oral   Take 1 tablet (25 mg total) by mouth daily.   30 tablet   11    Triage Vitals: BP 144/64  Pulse 72  Temp(Src) 98.6 F (37 C) (Oral)  Resp 20  Ht 5' 1.5" (1.562 m)  Wt 98 lb (44.453 kg)  BMI 18.22 kg/m2  SpO2 94%  Physical Exam  Nursing note and vitals reviewed. Constitutional: She is oriented to person, place, and time. She appears well-developed and well-nourished. No distress.  HENT:  Head: Normocephalic and atraumatic.    Eyes: Conjunctivae are normal. No scleral icterus.  Neck: Normal range of motion. Neck supple. No spinous process tenderness and no muscular tenderness present.  Cardiovascular: Normal rate and intact distal pulses.   Pulmonary/Chest: Effort normal. No stridor. No respiratory distress.  Abdominal: Normal appearance. She exhibits no distension. There is no tenderness. There is no rebound and no guarding.  Musculoskeletal:       Right hip: She exhibits decreased range of motion, tenderness and deformity.  1+ distal BLE pulses.  Motor, sensation intact BLE.    Neurological: She is alert and oriented to person, place, and time.  Skin: Skin is warm and dry. No rash noted.  Psychiatric: She has a normal mood and affect. Her behavior is normal.    ED Course  Procedures (including critical care time)  DIAGNOSTIC STUDIES: Oxygen Saturation is 94% on room air, adequate by my interpretation.    COORDINATION OF CARE: 7:49 PM- Discussed the x-ray findings with the patient that revealed a broken right hip.  Discussed consulting with the orthopedic surgeon to determine a proper method of treatment.  Discussed obtaining blood work, starting an [facility] transferring the patient to [employer] or [employer].  The patient agreed to the treatment plan.    Labs  Review Labs Reviewed - No data to display Imaging Review Dg Chest 1 View  12/07/2013   CLINICAL DATA:  History of trauma from a fall.  EXAM: CHEST - 1 VIEW  COMPARISON:  Chest x-ray 11/29/2013.  FINDINGS: Lung volumes are normal. No consolidative airspace disease. No pleural effusions. No pneumothorax. No pulmonary nodule or mass noted. Pulmonary vasculature and the cardiomediastinal silhouette are within normal limits. Visualize bony thorax is grossly intact. Status post median sternotomy. Left atrial appendage ligation clip noted. Atherosclerosis in the thoracic aorta.  IMPRESSION: 1. No evidence of significant acute traumatic injury to the thorax on this plain film examination. 2. Atherosclerosis. 3. Postoperative changes, as above.   Electronically Signed   By: Vinnie Langton M.D.   On: 12/07/2013 19:53   Dg Cervical Spine Complete  12/07/2013   CLINICAL DATA:  Post fall, hit head  EXAM: CERVICAL SPINE  4+ VIEWS  COMPARISON:  None.  FINDINGS: C1 to the superior endplate of T1 is imaged on the provided supine cross-table lateral radiograph.  Normal alignment of the cervical spine. No definite anterolisthesis or retrolisthesis. The bilateral facets appear aligned given obliquity. The dens is normally positioned between the lateral masses of C1.  Cervical vertebral body heights are preserved. Prevertebral soft tissues are normal.  There is moderate to severe multilevel cervical spine a DDD, likely worse at C5-C6 and C6-C7 and to a lesser extent,  C3-C4 and C4-C5 with disc space height loss, endplate irregularity and sclerosis.  Atherosclerotic plaque within the bilateral carotid bulbs.  Limited visualization of lung apices demonstrates sequela of prior median sternotomy. Atherosclerotic plaque within the aortic arch.  IMPRESSION: 1. No definite acute findings. Further evaluation could be performed with cervical spine CT as clinically indicated. 2. Moderate to severe multilevel cervical spine DDD.    Electronically Signed   By: Sandi Mariscal M.D.   On: 12/07/2013 19:58   Dg Hip Complete Right  12/07/2013   CLINICAL DATA:  Post fall, now with right-sided hip pain  EXAM: RIGHT HIP - COMPLETE 2+ VIEW  COMPARISON:  None.  FINDINGS: There is a minimally displaced right-sided subcapital neck femur fracture with associated minimal foreshortening. No definite intra-articular extension. No definite associated pelvic fracture. Limited visualization of the contralateral left hip is normal. Mild scoliotic curvature of the lower lumbar spine, convex to the left with associated suspected mild to moderate multilevel DDD. Regional soft tissues appear normal.  IMPRESSION: Minimally displaced right-sided subcapital femoral neck fracture.   Electronically Signed   By: Sandi Mariscal M.D.   On: 12/07/2013 19:55   Ct Head Wo Contrast  12/07/2013   CLINICAL DATA:  Post fall, hit back of head now with hematoma, no loss of consciousness.  EXAM: CT HEAD WITHOUT CONTRAST  TECHNIQUE: Contiguous axial images were obtained from the base of the skull through the vertex without intravenous contrast.  COMPARISON:  None.  FINDINGS: There is mild likely age-appropriate atrophy with mild diffuse sulcal prominence. Scattered periventricular hypodensities compatible with microvascular ischemic disease. Given background parenchymal abnormalities, there is no CT evidence of acute superimposed large territory infarct. No definite intraparenchymal or extra-axial mass or hemorrhage. Normal size and configuration of the ventricles and basilar cisterns. No midline shift. Limited visualization of the paranasal sinuses and mastoid air cells are normal.  Regional soft tissues are normal with special attention paid to the occiput. Post bilateral cataract surgery. No displaced calvarial fracture.  IMPRESSION: Mild likely age-appropriate atrophy and microvascular ischemic disease without acute intracranial process.   Electronically Signed   By: Sandi Mariscal  M.D.   On: 12/07/2013 19:31  All radiology studies independently viewed by me.     EKG Interpretation   None       MDM   Final diagnoses:  Subcapital fracture of right hip  UTI (urinary tract infection)  Fall    80 yo female with fall from standing.  Takes Xarelto for recent DVT.  Hit head, but no syncope or LOC.  She thinks fall was mechanical, notes several falls recently.  CT head negative.  Right hip is broken.  Will consult ortho and hospitalists.    Labs show hyponatremia, which husband says is subacute.  Also shows UTI, treated with Ceftriaxone.  Discussed with Dr. Mayer Camel who will evaluate patient at [facility].  Discussed with Dr. Roel Cluck who will admit.    I personally performed the services described in this documentation, which was scribed in my presence. The recorded information has been reviewed and is accurate.     Houston Siren III, MD 12/08/13 [phone]

## 2013-12-07 NOTE — ED Notes (Signed)
Family at bedside. 

## 2013-12-07 NOTE — ED Notes (Signed)
She slipped on hardwood floor and hit the back of her head and her right hip. She is taking a blood thinner.

## 2013-12-07 NOTE — ED Notes (Signed)
Patient is resting comfortably. 

## 2013-12-07 NOTE — ED Notes (Signed)
Informed Pt. And family about wait for [facility].  Pt. Husband wants to wait on [facility] for transport and not go [facility] [place].  Pt. Husband provided blanket and resting chair.  Pt. Has call bell at bedside and is aware that we will provide pain meds as she needs.

## 2013-12-07 NOTE — ED Notes (Signed)
Vital signs stable. 

## 2013-12-07 NOTE — ED Notes (Signed)
Pt. Just placed in Room #7 from CT scan.  Pt. Husband at bedside.

## 2013-12-08 ENCOUNTER — Telehealth: Payer: Self-pay | Admitting: Interventional Cardiology

## 2013-12-08 DIAGNOSIS — I82409 Acute embolism and thrombosis of unspecified deep veins of unspecified lower extremity: Secondary | ICD-10-CM

## 2013-12-08 DIAGNOSIS — Z79899 Other long term (current) drug therapy: Secondary | ICD-10-CM

## 2013-12-08 DIAGNOSIS — W19XXXA Unspecified fall, initial encounter: Secondary | ICD-10-CM

## 2013-12-08 DIAGNOSIS — I2789 Other specified pulmonary heart diseases: Secondary | ICD-10-CM

## 2013-12-08 DIAGNOSIS — I10 Essential (primary) hypertension: Secondary | ICD-10-CM

## 2013-12-08 DIAGNOSIS — N390 Urinary tract infection, site not specified: Secondary | ICD-10-CM

## 2013-12-08 DIAGNOSIS — S72033A Displaced midcervical fracture of unspecified femur, initial encounter for closed fracture: Principal | ICD-10-CM

## 2013-12-08 DIAGNOSIS — E871 Hypo-osmolality and hyponatremia: Secondary | ICD-10-CM | POA: Diagnosis present

## 2013-12-08 DIAGNOSIS — I2510 Atherosclerotic heart disease of native coronary artery without angina pectoris: Secondary | ICD-10-CM

## 2013-12-08 DIAGNOSIS — I4891 Unspecified atrial fibrillation: Secondary | ICD-10-CM

## 2013-12-08 LAB — CBC
HEMATOCRIT: 32.4 % — AB (ref 36.0–46.0)
Hemoglobin: 11.1 g/dL — ABNORMAL LOW (ref 12.0–15.0)
MCH: 27.5 pg (ref 26.0–34.0)
MCHC: 34.3 g/dL (ref 30.0–36.0)
MCV: 80.2 fL (ref 78.0–100.0)
PLATELETS: 373 10*3/uL (ref 150–400)
RBC: 4.04 MIL/uL (ref 3.87–5.11)
RDW: 16.1 % — AB (ref 11.5–15.5)
WBC: 9.2 10*3/uL (ref 4.0–10.5)

## 2013-12-08 LAB — BASIC METABOLIC PANEL
BUN: 10 mg/dL (ref 6–23)
BUN: 11 mg/dL (ref 6–23)
CHLORIDE: 92 meq/L — AB (ref 96–112)
CHLORIDE: 90 meq/L — AB (ref 96–112)
CO2: 21 meq/L (ref 19–32)
CO2: 19 mEq/L (ref 19–32)
Calcium: 8.9 mg/dL (ref 8.4–10.5)
Calcium: 8.4 mg/dL (ref 8.4–10.5)
Creatinine, Ser: 0.59 mg/dL (ref 0.50–1.10)
Creatinine, Ser: 0.63 mg/dL (ref 0.50–1.10)
GFR calc Af Amer: >90 mL/min (ref >90)
GFR calc non Af Amer: 80 mL/min — ABNORMAL LOW (ref >90)
GFR calc non Af Amer: 78 mL/min — ABNORMAL LOW (ref >90)
Glucose, Bld: 93 mg/dL (ref 70–99)
Glucose, Bld: 82 mg/dL (ref 70–99)
POTASSIUM: 4.6 meq/L (ref 3.7–5.3)
Potassium: 4.8 mEq/L (ref 3.7–5.3)
SODIUM: 125 meq/L — AB (ref 137–147)
Sodium: 121 mEq/L — CL (ref 137–147)

## 2013-12-08 LAB — OSMOLALITY: OSMOLALITY: 259 mosm/kg — AB (ref 275–300)

## 2013-12-08 LAB — HEPARIN LEVEL (UNFRACTIONATED): HEPARIN UNFRACTIONATED: 0.18 [IU]/mL — AB (ref 0.30–0.70)

## 2013-12-08 LAB — SODIUM: Sodium: 119 mEq/L — CL (ref 137–147)

## 2013-12-08 LAB — CREATININE, URINE, RANDOM: Creatinine, Urine: 113.75 mg/dL

## 2013-12-08 LAB — APTT: aPTT: 41 seconds — ABNORMAL HIGH (ref 24–37)

## 2013-12-08 LAB — OSMOLALITY, URINE: OSMOLALITY UR: 583 mosm/kg (ref 390–1090)

## 2013-12-08 LAB — SODIUM, URINE, RANDOM: Sodium, Ur: 58 mEq/L

## 2013-12-08 MED ORDER — WHITE PETROLATUM GEL
Status: AC
  Administered 2013-12-08: 13:00:00
  Filled 2013-12-08: qty 5

## 2013-12-08 MED ORDER — TOLVAPTAN 15 MG PO TABS
15.0000 mg | ORAL_TABLET | ORAL | Status: DC
  Administered 2013-12-08: 15 mg via ORAL
  Filled 2013-12-08 (×4): qty 1

## 2013-12-08 MED ORDER — HEPARIN (PORCINE) IN NACL 100-0.45 UNIT/ML-% IJ SOLN
600.0000 [IU]/h | INTRAMUSCULAR | Status: DC
  Administered 2013-12-08: 600 [IU]/h via INTRAVENOUS
  Filled 2013-12-08: qty 250

## 2013-12-08 MED ORDER — MORPHINE SULFATE 2 MG/ML IJ SOLN
INTRAMUSCULAR | Status: AC
  Administered 2013-12-08: 2 mg via INTRAVENOUS
  Filled 2013-12-08: qty 1

## 2013-12-08 MED ORDER — KETOROLAC TROMETHAMINE 15 MG/ML IJ SOLN: 15.0000 mg | Freq: Four times a day (QID) | INTRAMUSCULAR | Status: DC | PRN

## 2013-12-08 MED ORDER — SODIUM CHLORIDE 0.9 % IV BOLUS (SEPSIS): 500.0000 mL | Freq: Once | INTRAVENOUS | Status: DC

## 2013-12-08 MED ORDER — ACETAMINOPHEN 325 MG PO TABS
650.0000 mg | ORAL_TABLET | Freq: Four times a day (QID) | ORAL | Status: DC | PRN
  Administered 2013-12-12: 650 mg via ORAL
  Filled 2013-12-08: qty 2

## 2013-12-08 MED ORDER — SODIUM CHLORIDE 0.9 % IV BOLUS (SEPSIS): 250.0000 mL | Freq: Once | INTRAVENOUS | Status: DC

## 2013-12-08 MED ORDER — ONDANSETRON HCL 4 MG PO TABS
4.0000 mg | ORAL_TABLET | Freq: Four times a day (QID) | ORAL | Status: DC | PRN
Start: 2013-12-08 — End: 2013-12-12

## 2013-12-08 MED ORDER — SODIUM CHLORIDE 0.9 % IV SOLN
INTRAVENOUS | Status: DC
Start: 2013-12-08 — End: 2013-12-08
  Administered 2013-12-08: 05:00:00 via INTRAVENOUS

## 2013-12-08 MED ORDER — ACETAMINOPHEN 650 MG RE SUPP: 650.0000 mg | Freq: Four times a day (QID) | RECTAL | Status: DC | PRN

## 2013-12-08 MED ORDER — ALUM & MAG HYDROXIDE-SIMETH 200-200-20 MG/5ML PO SUSP: 30.0000 mL | Freq: Four times a day (QID) | ORAL | Status: DC | PRN

## 2013-12-08 MED ORDER — HYDROMORPHONE HCL PF 1 MG/ML IJ SOLN
0.5000 mg | INTRAMUSCULAR | Status: DC | PRN
  Administered 2013-12-08: 0.5 mg via INTRAVENOUS
  Filled 2013-12-08: qty 1

## 2013-12-08 MED ORDER — SODIUM CHLORIDE 0.9 % IV BOLUS (SEPSIS)
250.0000 mL | Freq: Once | INTRAVENOUS | Status: AC
  Administered 2013-12-08: 250 mL via INTRAVENOUS

## 2013-12-08 MED ORDER — HEPARIN (PORCINE) IN NACL 100-0.45 UNIT/ML-% IJ SOLN
850.0000 [IU]/h | INTRAMUSCULAR | Status: DC
Start: 2013-12-08 — End: 2013-12-09
  Administered 2013-12-08: 700 [IU]/h via INTRAVENOUS
  Administered 2013-12-09: 850 [IU]/h via INTRAVENOUS
  Filled 2013-12-08: qty 250

## 2013-12-08 MED ORDER — ONDANSETRON HCL 4 MG/2ML IJ SOLN
4.0000 mg | Freq: Four times a day (QID) | INTRAMUSCULAR | Status: DC | PRN
  Administered 2013-12-10: 4 mg via INTRAVENOUS
  Filled 2013-12-08: qty 2

## 2013-12-08 MED ORDER — DEXTROSE 5 % IV SOLN
1.0000 g | INTRAVENOUS | Status: DC
  Administered 2013-12-09 – 2013-12-12 (×5): 1 g via INTRAVENOUS
  Filled 2013-12-08 (×7): qty 10

## 2013-12-08 MED ORDER — OXYCODONE HCL 5 MG PO TABS
5.0000 mg | ORAL_TABLET | ORAL | Status: DC | PRN
  Administered 2013-12-08 – 2013-12-10 (×5): 5 mg via ORAL
  Filled 2013-12-08 (×5): qty 1

## 2013-12-08 NOTE — ED Notes (Signed)
Report given to [facility] RN

## 2013-12-08 NOTE — Progress Notes (Signed)
INITIAL NUTRITION ASSESSMENT  DOCUMENTATION CODES Per approved criteria  -Severe malnutrition in the context of chronic illness -Underweight   INTERVENTION: Declined oral nutrition supplements at this time. Encouraged intake, allow outside food if patient/family desires. RD to continue to follow nutrition care plan.  NUTRITION DIAGNOSIS: Increased nutrient needs related to surgery and repletion as evidenced by estimated needs.   Goal: Intake to meet >90% of estimated nutrition needs.  Monitor:  weight trends, lab trends, I/O's, PO intake, supplement tolerance  Reason for Assessment: Malnutrition Screening Tool  80 y.o. female  Admitting Dx: Hip fracture, right  ASSESSMENT: PMHx significant for CAD and HTN. Admitted s/p fall on hardwood flooring, pt with R hip pain and head pain. Work-up reveals minimally dislpaced subcapital fracture of the R proximal femur.  Sodium level is 121 and trending down. She has celiac disease and has followed a gluten free diet for the past 80 years.   Discussed intake with patient and husband at bedside. They confirm that she has lost weight and had poor appetite x 4 months. Husband is a physician. They are familiar with Gluten-Free diet here at the hospital.  Pt meets criteria for severe MALNUTRITION in the context of chronic illness as evidenced by 11% wt loss x 4 months and intake of <75% x at least 1 month.   Height: Ht Readings from Last 1 Encounters:  12/07/13 5' 1.5" (1.562 m)    Weight: Wt Readings from Last 1 Encounters:  12/07/13 98 lb (44.453 kg)    Ideal Body Weight: 108 lb  % Ideal Body Weight: 91%  Wt Readings from Last 10 Encounters:  12/07/13 98 lb (44.453 kg)  11/29/13 107 lb (48.535 kg)  11/29/13 99 lb (44.906 kg)  11/14/13 110 lb (49.896 kg)  11/08/13 103 lb (46.72 kg)  11/07/13 112 lb (50.803 kg)  10/17/13 103 lb (46.72 kg)  10/05/13 103 lb 4.8 oz (46.857 kg)  09/27/13 102 lb (46.267 kg)  09/25/13 104 lb  (47.174 kg)    Usual Body Weight: 110 lb  % Usual Body Weight: 89%   BMI:  Body mass index is 18.22 kg/(m^2). Underweight  Estimated Nutritional Needs: Kcal: 1350 - 1550 Protein: 50 - 60 g Fluid: 1.2- 1.5 liters dialy  Skin: intact  Diet Order: Gluten Restricted  EDUCATION NEEDS: -No education needs identified at this time   Intake/Output Summary (Last 24 hours) at 12/08/13 1518 Last data filed at 12/08/13 1341  Gross per 24 hour  Intake      0 ml  Output    825 ml  Net   -825 ml    Last BM: PTA   Labs:   Recent Labs Lab 12/07/13 2028 12/08/13 0543 12/08/13 1405  NA 124* 125* 121*  K 4.5 4.8 4.6  CL 89* 92* 90*  CO2 20 21 19   BUN 13 10 11   CREATININE 0.70 0.59 0.63  CALCIUM 9.1 8.9 8.4  GLUCOSE 97 93 82    CBG (last 3)  No results found for this basename: GLUCAP,  in the last 72 hours  Scheduled Meds: . cefTRIAXone (ROCEPHIN)  IV  1 g Intravenous Q24H    Continuous Infusions: . sodium chloride 75 mL/hr at 12/08/13 0447  . heparin 600 Units/hr (12/08/13 1115)    Past Medical History  Diagnosis Date  . Osteoporosis   . Celiac disease   . Celiac disease   . Carotid artery occlusion     right carotid stenosis 40-60%, 4/08, neg for stenosis repeat  study 11/11  . Depression   . Presbycusis   . Macular degeneration   . Urethral stricture   . Moderate aortic insufficiency   . Moderate mitral regurgitation by prior echocardiogram     echo 9/13   . Thyroid nodule     53m, no change 11/11  . RLS (restless legs syndrome)   . Vitamin D deficiency   . Heart murmur   . Pulmonary hypertension   . Hypertension     takes Metoprolol daily  . Atrial fibrillation   . Coronary artery disease   . CHF (congestive heart failure)     takes Lasix daily  . MVP (mitral valve prolapse)   . Moderate obstructive sleep apnea     uses CPAP;sleep study about 4-546monthago  . Hearing loss     both ears  . Numbness     left arm  . Joint swelling   . GERD  (gastroesophageal reflux disease)     takes Omeprazole daily  . Gastric ulcer     6-61m36monthgo  . GI bleeding   . Constipation   . Diarrhea   . Hemorrhoids   . Urinary retention   . Urinary anastomotic stricture   . Hypothyroidism     takes Synthroid daily as result of AMiodarone  . Hot flashes     takes ZOloft 3 times a week  . Insomnia     takes Melatonin nightly  . Restless leg   . Peripheral edema     left  . DVT (deep venous thrombosis)     Past Surgical History  Procedure Laterality Date  . Appendectomy    . Tubal ligation    . Tonsillectomy    . Trigger thumb    . Cardiac catheterization  08-06-13  . Tcs    . Bilateral cataract surgery    . Mandible surgery    . Coronary artery bypass graft N/A 08/28/2013    Procedure: CORONARY ARTERY BYPASS GRAFTING (CABG) x2 using right greater saphenous vein and left internal mammary artery. ;  Surgeon: EdwGrace IsaacD;  [facility];  Laterality: N/A;  . Intraoperative transesophageal echocardiogram N/A 08/28/2013    Procedure: INTRAOPERATIVE TRANSESOPHAGEAL ECHOCARDIOGRAM;  Surgeon: EdwGrace IsaacD;  [facility] [place];  Laterality: N/A;    SamInda Coke, RD, LDN Inpatient Registered Dietitian Pager: [phone]-hours pager: [phone]

## 2013-12-08 NOTE — Progress Notes (Signed)
ANTICOAGULATION CONSULT NOTE - Initial Consult  Pharmacy Consult for Heparin Indication: Bridge therapy for Xarelto (on hold for surgery)  Allergies  Allergen Reactions  . Amoxicillin Other (See Comments)    Nausea, dizziness  . Codeine Nausea And Vomiting  . Fosamax [Alendronate Sodium] Other (See Comments)    Jaw pain  . Gluten Meal Other (See Comments)    Celiac Disease  . Hctz [Hydrochlorothiazide] Other (See Comments)    Hyponatremia, GI upset  . Tetanus Toxoids Other (See Comments)    Bad local reaction   Patient Measurements: Height: 5' 1.5" (156.2 cm) Weight: 98 lb (44.453 kg) IBW/kg (Calculated) : 48.95  Vital Signs: Temp: 98.1 F (36.7 C) (02/14 0300) Temp src: Oral (02/14 0300) BP: 103/41 mmHg (02/14 0300) Pulse Rate: 70 (02/14 0300)  Labs:  Recent Labs  12/07/13 2028 12/08/13 0543  HGB 11.1* 11.1*  HCT 32.0* 32.4*  PLT 360 373  LABPROT 17.0*  --   INR 1.42  --   CREATININE 0.70 0.59   Estimated Creatinine Clearance: 34.8 ml/min (by C-G formula based on Cr of 0.59).  Medical History: Past Medical History  Diagnosis Date  . Osteoporosis   . Celiac disease   . Celiac disease   . Carotid artery occlusion     right carotid stenosis 40-60%, 4/08, neg for stenosis repeat study 11/11  . Depression   . Presbycusis   . Macular degeneration   . Urethral stricture   . Moderate aortic insufficiency   . Moderate mitral regurgitation by prior echocardiogram     echo 9/13   . Thyroid nodule     46m, no change 11/11  . RLS (restless legs syndrome)   . Vitamin D deficiency   . Heart murmur   . Pulmonary hypertension   . Hypertension     takes Metoprolol daily  . Atrial fibrillation   . Coronary artery disease   . CHF (congestive heart failure)     takes Lasix daily  . MVP (mitral valve prolapse)   . Moderate obstructive sleep apnea     uses CPAP;sleep study about 4-5235monthago  . Hearing loss     both ears  . Numbness     left arm  . Joint  swelling   . GERD (gastroesophageal reflux disease)     takes Omeprazole daily  . Gastric ulcer     6-35m64monthgo  . GI bleeding   . Constipation   . Diarrhea   . Hemorrhoids   . Urinary retention   . Urinary anastomotic stricture   . Hypothyroidism     takes Synthroid daily as result of AMiodarone  . Hot flashes     takes ZOloft 3 times a week  . Insomnia     takes Melatonin nightly  . Restless leg   . Peripheral edema     left  . DVT (deep venous thrombosis)    Medications:  Prescriptions prior to admission  Medication Sig Dispense Refill  . acetaminophen (TYLENOL) 325 MG tablet Take 650 mg by mouth every 6 (six) hours as needed.      . docusate sodium (COLACE) 100 MG capsule Take 100 mg by mouth daily as needed.       . lMarland Kitchenvothyroxine (SYNTHROID, LEVOTHROID) 50 MCG tablet Take 1 tablet (50 mcg total) by mouth daily before breakfast.  30 tablet  11  . magnesium hydroxide (MILK OF MAGNESIA) 400 MG/5ML suspension Take 5 mLs by mouth daily as needed for mild constipation.       .Marland Kitchen  Melatonin 3 MG TABS Take 1 tablet by mouth at bedtime as needed (sleep).      . MULTIPLE VITAMIN PO Take 1 tablet by mouth daily.      Marland Kitchen omeprazole (PRILOSEC) 20 MG capsule Take 20 mg by mouth every other day.       . Rivaroxaban (XARELTO) 20 MG TABS tablet Take 1 tablet (20 mg total) by mouth daily with supper. Start January 3rd.  30 tablet  5  . sertraline (ZOLOFT) 50 MG tablet Take 50 mg by mouth every other day.      . spironolactone (ALDACTONE) 25 MG tablet Take 1 tablet (25 mg total) by mouth daily.  30 tablet  11  . LORazepam (ATIVAN) 0.5 MG tablet Take 1 tablet (0.5 mg total) by mouth at bedtime.  30 tablet  0   Assessment: 80 yo female admitted with hip pain after a fall.  Her x-ray and CT scans reveal a hip fracture of the femur and she is being evaluated by orthopedic services.  Her last dose of rivaroxaban was documented as 2/12.  Her INR is still somewhat reflective of this at 1.42 today.   She was placed on rivaroxaban therapy for a new DVT ~ 2 months ago.  We have been asked to start some IV heparin as bridge therapy for her while her work-up is ongoing.  Today her H/H is stable as well as her platelets.  She is less than IBW and we will use her TBW of 44.5 kg as her dosing weight.  I have reviewed her medications and Ketorolac increases her risk of GI bleed with anticoagulation, thus would preferentially choose Vicodin for pain management or Tylenol.   Goal of Therapy:  Heparin level 0.3-0.7 units/ml Monitor platelets by anticoagulation protocol: Yes   Plan:  1.  Will begin IV heparin without a bolus at 600 units/hr 2.  Obtain a heparin level 8 hours after starting 3.  Monitor CBC/Heparin level daily 4.  Monitor for s/s of bleeding complications  Rober Minion, PharmD., MS Clinical Pharmacist Pager:  [phone] Thank you for allowing pharmacy to be part of this patients care team. 12/08/2013,8:34 AM

## 2013-12-08 NOTE — Progress Notes (Signed)
ANTICOAGULATION CONSULT NOTE FOR HEPARIN Indication: Bridge therapy for Xarelto (on hold for surgery)  Heparin level = 0.18 on 600 units/hr Goal heparin level  0.3-0.7  The heparin level is subtherapeutic. Will increase the dose to 700 units/hr. Will check heparin level with AM labs.  Arrie Senate, PharmD

## 2013-12-08 NOTE — Progress Notes (Signed)
Saline locked IV per MD order.

## 2013-12-08 NOTE — H&P (Addendum)
[facility] History and Physical  [name] INO:[id number] DOB: December 13, 1923 DOA: 12/07/2013  Referring physician: EDP PCP: Mathews Argyle, MD  Specialists: Cardiologist:  Dr. Daneen Schick III  Chief Complaint: Right Hip Pain Following Fall  HPI: Maria [name] is a 80 y.o. female with a history of  CAD, and HTN, who presented to the [facility] ED with  Complaints of constant right hip and head pain that started today after the patient slipped and fell on hardwood flooring. The patient states that the patient hit the crown of her head, specifically on the right side, on some furniture. She denies losing consciousness at the time of the fall or after the fall. She denies feeling dizzy or lightheaded at the time of the fall. She states that her headache did not start immediately after the fall. She denies neck pain, abdominal pain, arthralgias, vomiting, SOB, chest pain, and numbness or tingling in her arms or legs bilaterally.  The patient's husband states that the patient has fallen three times this week spontaneously after turning or getting up too quickly. The patient's husband states that she takes Xarelto regularly and had her last dose was 36 hours ago.  The patient's husband states that the patient has a history of DVT in her right leg diagnosed 2 months ago.  In the [facility] she was evaluated and had X-rays and CT scan s performed Brain, and C-Spine which were negative for acute findings, and the X-ray study of the Right Hip revealed a minimally dislpaced subcapital fracture of the proximal femur.   Dr. Mayer Camel of Orthopedics was consulted and is to see the patient this AM.   Her cardiologist is Dr. [name] and her PCP is  Dr. Felipa Eth.    Review of Systems:  Constitutional: No Weight Loss, No Weight Gain, Night Sweats, Fevers, Chills, Fatigue, or Generalized Weakness HEENT: No Headaches, Difficulty Swallowing,Tooth/Dental Problems,Sore Throat,  No Sneezing, Rhinitis, Ear Ache, Nasal  Congestion, or Post Nasal Drip,  Cardio-vascular:  No Chest pain, Orthopnea, PND, Edema in lower extremities, Anasarca, Dizziness, Palpitations  Resp: No Dyspnea, No DOE, No Productive Cough, No Non-Productive Cough, No Hemoptysis, No Change in Color of Mucus,  No Wheezing.    GI: No Heartburn, Indigestion, Abdominal Pain, Nausea, Vomiting, Diarrhea, Change in Bowel Habits,  Loss of Appetite  GU: No Dysuria, Change in Color of Urine, No Urgency or Frequency.  No flank pain.  Musculoskeletal: No Joint Pain or Swelling.  No Decreased Range of Motion. No Back Pain.  Neurologic: No Syncope, No Seizures, Muscle Weakness, Paresthesia, Vision Disturbance or Loss, No Diplopia, No Vertigo, No Difficulty Walking,  Skin: No Rash or Lesions. Psych: No Change in Mood or Affect. No Depression or Anxiety. No Memory loss. No Confusion or Hallucinations   Past Medical History  Diagnosis Date  . Osteoporosis   . Celiac disease   . Celiac disease   . Carotid artery occlusion     right carotid stenosis 40-60%, 4/08, neg for stenosis repeat study 11/11  . Depression   . Presbycusis   . Macular degeneration   . Urethral stricture   . Moderate aortic insufficiency   . Moderate mitral regurgitation by prior echocardiogram     echo 9/13   . Thyroid nodule     81m, no change 11/11  . RLS (restless legs syndrome)   . Vitamin D deficiency   . Heart murmur   . Pulmonary hypertension   . Hypertension     takes Metoprolol daily  .  Atrial fibrillation   . Coronary artery disease   . CHF (congestive heart failure)     takes Lasix daily  . MVP (mitral valve prolapse)   . Moderate obstructive sleep apnea     uses CPAP;sleep study about 4-59month ago  . Hearing loss     both ears  . Numbness     left arm  . Joint swelling   . GERD (gastroesophageal reflux disease)     takes Omeprazole daily  . Gastric ulcer     6-885monthago  . GI bleeding   . Constipation   . Diarrhea   . Hemorrhoids   . Urinary  retention   . Urinary anastomotic stricture   . Hypothyroidism     takes Synthroid daily as result of AMiodarone  . Hot flashes     takes ZOloft 3 times a week  . Insomnia     takes Melatonin nightly  . Restless leg   . Peripheral edema     left  . DVT (deep venous thrombosis)       Past Surgical History  Procedure Laterality Date  . Appendectomy    . Tubal ligation    . Tonsillectomy    . Trigger thumb    . Cardiac catheterization  08-06-13  . Tcs    . Bilateral cataract surgery    . Mandible surgery    . Coronary artery bypass graft N/A 08/28/2013    Procedure: CORONARY ARTERY BYPASS GRAFTING (CABG) x2 using right greater saphenous vein and left internal mammary artery. ;  Surgeon: EdGrace IsaacMD;  [place] [facility] [facility];  Laterality: N/A;  . Intraoperative transesophageal echocardiogram N/A 08/28/2013    Procedure: INTRAOPERATIVE TRANSESOPHAGEAL ECHOCARDIOGRAM;  Surgeon: EdGrace IsaacMD;  [place] [facility] [facility] [facility];  Laterality: N/A;       Prior to Admission medications   Medication Sig Start Date End Date Taking? Authorizing Provider  acetaminophen (TYLENOL) 325 MG tablet Take 650 mg by mouth every 6 (six) hours as needed.    Historical Provider, MD  docusate sodium (COLACE) 100 MG capsule Take 100 mg by mouth daily as needed.     Historical Provider, MD  levothyroxine (SYNTHROID, LEVOTHROID) 50 MCG tablet Take 1 tablet (50 mcg total) by mouth daily before breakfast. 11/30/13   HeBelva CromeII, MD  LORazepam (ATIVAN) 0.5 MG tablet Take 1 tablet (0.5 mg total) by mouth at bedtime. 11/30/13   HeBelva CromeII, MD  magnesium hydroxide (MILK OF MAGNESIA) 400 MG/5ML suspension Take 5 mLs by mouth daily as needed for mild constipation.     Historical Provider, MD  MULTIPLE VITAMIN PO Take 1 tablet by mouth daily.    Historical Provider, MD  omeprazole (PRILOSEC) 20 MG capsule Take 20 mg by mouth every other day.      Historical Provider, MD  Rivaroxaban (XARELTO) 20 MG TABS tablet Take 1 tablet (20 mg total) by mouth daily with supper. Start January 3rd. 10/05/13   HeBelva CromeII, MD  sertraline (ZOLOFT) 50 MG tablet Take 50 mg by mouth every other day.    Historical Provider, MD  spironolactone (ALDACTONE) 25 MG tablet Take 1 tablet (25 mg total) by mouth daily. 11/30/13   HeSinclair GroomsMD      Allergies  Allergen Reactions  . Amoxicillin Other (See Comments)    Nausea, dizziness  . Codeine Nausea And Vomiting  . Fosamax [Alendronate  Sodium] Other (See Comments)    Jaw pain  . Gluten Meal Other (See Comments)    Celiac Disease  . Hctz [Hydrochlorothiazide] Other (See Comments)    Hyponatremia, GI upset  . Tetanus Toxoids Other (See Comments)    Bad local reaction     Social History:  reports that she has never smoked. She has never used smokeless tobacco. She reports that she does not drink alcohol or use illicit drugs.     Family History  Problem Relation Age of Onset  . Heart attack Mother   . CVA Mother   . CAD Father   . Colon cancer Father   . Heart attack Father   . Heart attack Brother   . Peripheral vascular disease Brother   . AAA (abdominal aortic aneurysm) Brother        Physical Exam:  GEN:  Pleasant  Elderly Well NOurished and Well Developed  80 y.o. Caucasian female  examined  and in no acute distress; cooperative with exam Filed Vitals:   12/08/13 0000 12/08/13 0030 12/08/13 0100 12/08/13 0300  BP: 108/49 122/47 116/54 103/41  Pulse: 75 75 78 70  Temp:    98.1 F (36.7 C)  TempSrc:    Oral  Resp: 19 18 20 16   Height:      Weight:      SpO2: 90% 91% 92% 93%   Blood pressure 103/41, pulse 70, temperature 98.1 F (36.7 C), temperature source Oral, resp. rate 16, height 5' 1.5" (1.562 m), weight 44.453 kg (98 lb), SpO2 93.00%. PSYCH: She is alert and oriented x4; does not appear anxious does not appear depressed; affect is normal HEENT: Normocephalic and  Atraumatic, Mucous membranes pink; PERRLA; EOM intact; Fundi:  Benign;  No scleral icterus, Nares: Patent, Oropharynx: Clear, Fair Dentition, Neck:  FROM, no cervical lymphadenopathy nor thyromegaly or carotid bruit; no JVD; Breasts:: Not examined CHEST WALL: No tenderness CHEST: Normal respiration, clear to auscultation bilaterally HEART: Regular rate and rhythm; no murmurs rubs or gallops BACK: No kyphosis or scoliosis; no CVA tenderness ABDOMEN: Positive Bowel Sounds, soft non-tender; no masses, no organomegaly. Rectal Exam: Not done EXTREMITIES: No cyanosis, clubbing or edema; no ulcerations. Genitalia: not examined PULSES: 2+ and symmetric SKIN: Normal hydration no rash or ulceration CNS: Alert, oriented,   Nonfocal Vascular: pulses palpable throughout    Labs on Admission:  Basic Metabolic Panel:  Recent Labs Lab 12/07/13 2028  NA 124*  K 4.5  CL 89*  CO2 20  GLUCOSE 97  BUN 13  CREATININE 0.70  CALCIUM 9.1   Liver Function Tests: No results found for this basename: AST, ALT, ALKPHOS, BILITOT, PROT, ALBUMIN,  in the last 168 hours No results found for this basename: LIPASE, AMYLASE,  in the last 168 hours No results found for this basename: AMMONIA,  in the last 168 hours CBC:  Recent Labs Lab 12/07/13 2028  WBC 10.7*  NEUTROABS 8.6*  HGB 11.1*  HCT 32.0*  MCV 79.8  PLT 360   Cardiac Enzymes: No results found for this basename: CKTOTAL, CKMB, CKMBINDEX, TROPONINI,  in the last 168 hours  BNP (last 3 results)  Recent Labs  09/05/13 0810 09/12/13 0011  PROBNP 3947.0* 1314.0*   CBG: No results found for this basename: GLUCAP,  in the last 168 hours  Radiological Exams on Admission: Dg Chest 1 View  12/07/2013   CLINICAL DATA:  History of trauma from a fall.  EXAM: CHEST - 1 VIEW  COMPARISON:  Chest  x-ray 11/29/2013.  FINDINGS: Lung volumes are normal. No consolidative airspace disease. No pleural effusions. No pneumothorax. No pulmonary nodule or mass  noted. Pulmonary vasculature and the cardiomediastinal silhouette are within normal limits. Visualize bony thorax is grossly intact. Status post median sternotomy. Left atrial appendage ligation clip noted. Atherosclerosis in the thoracic aorta.  IMPRESSION: 1. No evidence of significant acute traumatic injury to the thorax on this plain film examination. 2. Atherosclerosis. 3. Postoperative changes, as above.   Electronically Signed   By: Vinnie Langton M.D.   On: 12/07/2013 19:53   Dg Cervical Spine Complete  12/07/2013   CLINICAL DATA:  Post fall, hit head  EXAM: CERVICAL SPINE  4+ VIEWS  COMPARISON:  None.  FINDINGS: C1 to the superior endplate of T1 is imaged on the provided supine cross-table lateral radiograph.  Normal alignment of the cervical spine. No definite anterolisthesis or retrolisthesis. The bilateral facets appear aligned given obliquity. The dens is normally positioned between the lateral masses of C1.  Cervical vertebral body heights are preserved. Prevertebral soft tissues are normal.  There is moderate to severe multilevel cervical spine a DDD, likely worse at C5-C6 and C6-C7 and to a lesser extent, C3-C4 and C4-C5 with disc space height loss, endplate irregularity and sclerosis.  Atherosclerotic plaque within the bilateral carotid bulbs.  Limited visualization of lung apices demonstrates sequela of prior median sternotomy. Atherosclerotic plaque within the aortic arch.  IMPRESSION: 1. No definite acute findings. Further evaluation could be performed with cervical spine CT as clinically indicated. 2. Moderate to severe multilevel cervical spine DDD.   Electronically Signed   By: Sandi Mariscal M.D.   On: 12/07/2013 19:58   Dg Hip Complete Right  12/07/2013   CLINICAL DATA:  Post fall, now with right-sided hip pain  EXAM: RIGHT HIP - COMPLETE 2+ VIEW  COMPARISON:  None.  FINDINGS: There is a minimally displaced right-sided subcapital neck femur fracture with associated minimal  foreshortening. No definite intra-articular extension. No definite associated pelvic fracture. Limited visualization of the contralateral left hip is normal. Mild scoliotic curvature of the lower lumbar spine, convex to the left with associated suspected mild to moderate multilevel DDD. Regional soft tissues appear normal.  IMPRESSION: Minimally displaced right-sided subcapital femoral neck fracture.   Electronically Signed   By: Sandi Mariscal M.D.   On: 12/07/2013 19:55   Ct Head Wo Contrast  12/07/2013   CLINICAL DATA:  Post fall, hit back of head now with hematoma, no loss of consciousness.  EXAM: CT HEAD WITHOUT CONTRAST  TECHNIQUE: Contiguous axial images were obtained from the base of the skull through the vertex without intravenous contrast.  COMPARISON:  None.  FINDINGS: There is mild likely age-appropriate atrophy with mild diffuse sulcal prominence. Scattered periventricular hypodensities compatible with microvascular ischemic disease. Given background parenchymal abnormalities, there is no CT evidence of acute superimposed large territory infarct. No definite intraparenchymal or extra-axial mass or hemorrhage. Normal size and configuration of the ventricles and basilar cisterns. No midline shift. Limited visualization of the paranasal sinuses and mastoid air cells are normal.  Regional soft tissues are normal with special attention paid to the occiput. Post bilateral cataract surgery. No displaced calvarial fracture.  IMPRESSION: Mild likely age-appropriate atrophy and microvascular ischemic disease without acute intracranial process.   Electronically Signed   By: Sandi Mariscal M.D.   On: 12/07/2013 19:31      EKG:    Sinus Rhythm, with a 1st Degree AVB, and +RBBB, and LVH changes,  Rate 75.       Assessment/Plan:   80 y.o. female with  Principal Problem:   Hip fracture, right Active Problems:   Essential hypertension, benign   UTI (lower urinary tract infection)   Hyponatremia   Paroxysmal  atrial fibrillation   Chronic diastolic heart failure   Coronary atherosclerosis of native coronary artery   DVT (deep venous thrombosis)   Anticoagulation goal of INR 2.5 to 3.5   Fall      1.   Closed Right Hip Fracture S/P Mechanical Fall-   Orthopedics to evalaute for surgical repair, Pain control with IV Dilaudid PRN,   Her PT/INR are < 1.5, and per the husband it has been 36 hours since her last dose of the Xarelto.   The remaining factor which may delay her surgery is her Sodium level of 124,  Which may be due to her diuretic Rx of Spironolactone.   Monitor electrolytes and hold the spironolactone rx.  EKG ordered.    2.    UTI- Urine C+S sent, and placed on IV Rocephin  To adjust PRN Urine Culture results.     3.    Hyponatremia-  Due to Diuretic Rx of Spironolactone.   Hold Spironolactone, and gentle IVFs with NSS ordered.   Monitor Na+ Levels.  Check Urine Lytes and Osm, adjust therapy pending results.      4.    Paroxysmal Atrial Fibrillation- hx on Xarelto Rx,  Xarelto on hold for now,   5.   CAD- hx- stable for now, Has had Recent CABG 08/2013 stable from a cardiac standpoint.    6.    DVT hx- on Xarelto Rx-  Resume Rx post surgically for DVT treatment.    7.    Chronic Diastolic CHF hx= Monitor for S/Sxs of Fluid overload.       ADDENDUM:   Reviewed and Discussed Patient's Cardiac history and reports with Dr. Missy Sabins and review of her recent CABG in 08/2013, she is clear from a cardiac standpoint to undergo Surgery.       Code Status:   FULL CODE Family Communication:  Husband at Bedside Disposition Plan:     Inpatient   Time spent:   [facility] Pager [phone]  If 7PM-7AM, please contact night-coverage [email or web address] Password [facility] 12/08/2013, 4:35 AM

## 2013-12-08 NOTE — Consult Note (Addendum)
Reason for Consult:  Valgus impacted right femoral neck fracture Referring Physician: Harvettet [name] is an 80 y.o. female.  HPI: Patient slipped and fell on a hardwood floor yesterday and sustained a valgus impacted right femoral neck fracture. She lives with her husband at [facility], her husband is a retired Engineer, site. The patient denies any loss of consciousness and a CT scan done at [facility] did not show any evidence of stroke or cerebral event. She is recovering from a two-vessel coronary artery bypass procedure that was done in November of 2014 with Dr. Servando Snare and she is doing well regarding the surgery. She is normally on Xarelto 54m per day and her last dose was approximately 36 hours ago. Her sodium is 124 and medicine is holding her spiral act found in the hopes of increasing her sodium. She denies any specific chest pain or shortness of breath. She does have significant past medical history for coronary artery disease and mild congestive heart failure as well as atrial fibrillation. She also is moderate mitral regurgitation by echo that was done in September of 2013. The patient was diagnosed with a DVT in her right lower extremity a few months ago and that is the reason she is on those are also.  Past Medical History  Diagnosis Date  . Osteoporosis   . Celiac disease   . Celiac disease   . Carotid artery occlusion     right carotid stenosis 40-60%, 4/08, neg for stenosis repeat study 11/11  . Depression   . Presbycusis   . Macular degeneration   . Urethral stricture   . Moderate aortic insufficiency   . Moderate mitral regurgitation by prior echocardiogram     echo 9/13   . Thyroid nodule     763m no change 11/11  . RLS (restless legs syndrome)   . Vitamin D deficiency   . Heart murmur   . Pulmonary hypertension   . Hypertension     takes Metoprolol daily  . Atrial fibrillation   . Coronary artery disease   .  CHF (congestive heart failure)     takes Lasix daily  . MVP (mitral valve prolapse)   . Moderate obstructive sleep apnea     uses CPAP;sleep study about 4-65m365monthgo  . Hearing loss     both ears  . Numbness     left arm  . Joint swelling   . GERD (gastroesophageal reflux disease)     takes Omeprazole daily  . Gastric ulcer     6-71mo53montho  . GI bleeding   . Constipation   . Diarrhea   . Hemorrhoids   . Urinary retention   . Urinary anastomotic stricture   . Hypothyroidism     takes Synthroid daily as result of AMiodarone  . Hot flashes     takes ZOloft 3 times a week  . Insomnia     takes Melatonin nightly  . Restless leg   . Peripheral edema     left  . DVT (deep venous thrombosis)     Past Surgical History  Procedure Laterality Date  . Appendectomy    . Tubal ligation    . Tonsillectomy    . Trigger thumb    . Cardiac catheterization  08-06-13  . Tcs    . Bilateral cataract surgery    . Mandible surgery    . Coronary artery bypass graft N/A 08/28/2013    Procedure: CORONARY  ARTERY BYPASS GRAFTING (CABG) x2 using right greater saphenous vein and left internal mammary artery. ;  Surgeon: Grace Isaac, MD;  Location: [facility];  Service: Open Heart Surgery;  Laterality: N/A;  . Intraoperative transesophageal echocardiogram N/A 08/28/2013    Procedure: INTRAOPERATIVE TRANSESOPHAGEAL ECHOCARDIOGRAM;  Surgeon: Grace Isaac, MD;  Location: [place];  Service: Open Heart Surgery;  Laterality: N/A;    Family History  Problem Relation Age of Onset  . Heart attack Mother   . CVA Mother   . CAD Father   . Colon cancer Father   . Heart attack Father   . Heart attack Brother   . Peripheral vascular disease Brother   . AAA (abdominal aortic aneurysm) Brother     Social History:  reports that she has never smoked. She has never used smokeless tobacco. She reports that she does not drink alcohol or use illicit drugs.  Allergies:  Allergies  Allergen Reactions  .  Amoxicillin Other (See Comments)    Nausea, dizziness  . Codeine Nausea And Vomiting  . Fosamax [Alendronate Sodium] Other (See Comments)    Jaw pain  . Gluten Meal Other (See Comments)    Celiac Disease  . Hctz [Hydrochlorothiazide] Other (See Comments)    Hyponatremia, GI upset  . Tetanus Toxoids Other (See Comments)    Bad local reaction    Medications: I have reviewed the patient's current medications.  Results for orders placed during the hospital encounter of 12/07/13 (from the past 48 hour(s))  BASIC METABOLIC PANEL     Status: Abnormal   Collection Time    12/07/13  8:28 PM      Result Value Ref Range   Sodium 124 (*) 137 - 147 mEq/L   Potassium 4.5  3.7 - 5.3 mEq/L   Chloride 89 (*) 96 - 112 mEq/L   CO2 20  19 - 32 mEq/L   Glucose, Bld 97  70 - 99 mg/dL   BUN 13  6 - 23 mg/dL   Creatinine, Ser 0.70  0.50 - 1.10 mg/dL   Calcium 9.1  8.4 - 10.5 mg/dL   GFR calc non Af Amer 76 (*) >90 mL/min   GFR calc Af Amer 88 (*) >90 mL/min   Comment: (NOTE)     The eGFR has been calculated using the CKD EPI equation.     This calculation has not been validated in all clinical situations.     eGFR's persistently <90 mL/min signify possible Chronic Kidney     Disease.  CBC WITH DIFFERENTIAL     Status: Abnormal   Collection Time    12/07/13  8:28 PM      Result Value Ref Range   WBC 10.7 (*) 4.0 - 10.5 K/uL   RBC 4.01  3.87 - 5.11 MIL/uL   Hemoglobin 11.1 (*) 12.0 - 15.0 g/dL   HCT 32.0 (*) 36.0 - 46.0 %   MCV 79.8  78.0 - 100.0 fL   MCH 27.7  26.0 - 34.0 pg   MCHC 34.7  30.0 - 36.0 g/dL   RDW 15.7 (*) 11.5 - 15.5 %   Platelets 360  150 - 400 K/uL   Neutrophils Relative % 81 (*) 43 - 77 %   Neutro Abs 8.6 (*) 1.7 - 7.7 K/uL   Lymphocytes Relative 14  12 - 46 %   Lymphs Abs 1.5  0.7 - 4.0 K/uL   Monocytes Relative 4  3 - 12 %   Monocytes Absolute 0.5  0.1 - 1.0 K/uL   Eosinophils Relative 0  0 - 5 %   Eosinophils Absolute 0.0  0.0 - 0.7 K/uL   Basophils Relative 0  0 -  1 %   Basophils Absolute 0.0  0.0 - 0.1 K/uL  PROTIME-INR     Status: Abnormal   Collection Time    12/07/13  8:28 PM      Result Value Ref Range   Prothrombin Time 17.0 (*) 11.6 - 15.2 seconds   INR 1.42  0.00 - 1.49  URINALYSIS, ROUTINE W REFLEX MICROSCOPIC     Status: Abnormal   Collection Time    12/07/13  8:55 PM      Result Value Ref Range   Color, Urine YELLOW  YELLOW   APPearance CLOUDY (*) CLEAR   Specific Gravity, Urine 1.009  1.005 - 1.030   pH 6.5  5.0 - 8.0   Glucose, UA NEGATIVE  NEGATIVE mg/dL   Hgb urine dipstick SMALL (*) NEGATIVE   Bilirubin Urine NEGATIVE  NEGATIVE   Ketones, ur NEGATIVE  NEGATIVE mg/dL   Protein, ur NEGATIVE  NEGATIVE mg/dL   Urobilinogen, UA 0.2  0.0 - 1.0 mg/dL   Nitrite POSITIVE (*) NEGATIVE   Leukocytes, UA LARGE (*) NEGATIVE  URINE MICROSCOPIC-ADD ON     Status: Abnormal   Collection Time    12/07/13  8:55 PM      Result Value Ref Range   Squamous Epithelial / LPF RARE  RARE   WBC, UA 21-50  <3 WBC/hpf   RBC / HPF 3-6  <3 RBC/hpf   Bacteria, UA MANY (*) RARE  BASIC METABOLIC PANEL     Status: Abnormal   Collection Time    12/08/13  5:43 AM      Result Value Ref Range   Sodium 125 (*) 137 - 147 mEq/L   Potassium 4.8  3.7 - 5.3 mEq/L   Chloride 92 (*) 96 - 112 mEq/L   CO2 21  19 - 32 mEq/L   Glucose, Bld 93  70 - 99 mg/dL   BUN 10  6 - 23 mg/dL   Creatinine, Ser 0.59  0.50 - 1.10 mg/dL   Calcium 8.9  8.4 - 10.5 mg/dL   GFR calc non Af Amer 80 (*) >90 mL/min   GFR calc Af Amer >90  >90 mL/min   Comment: (NOTE)     The eGFR has been calculated using the CKD EPI equation.     This calculation has not been validated in all clinical situations.     eGFR's persistently <90 mL/min signify possible Chronic Kidney     Disease.  CBC     Status: Abnormal   Collection Time    12/08/13  5:43 AM      Result Value Ref Range   WBC 9.2  4.0 - 10.5 K/uL   RBC 4.04  3.87 - 5.11 MIL/uL   Hemoglobin 11.1 (*) 12.0 - 15.0 g/dL   HCT 32.4 (*)  36.0 - 46.0 %   MCV 80.2  78.0 - 100.0 fL   MCH 27.5  26.0 - 34.0 pg   MCHC 34.3  30.0 - 36.0 g/dL   RDW 16.1 (*) 11.5 - 15.5 %   Platelets 373  150 - 400 K/uL    Dg Chest 1 View  12/07/2013   CLINICAL DATA:  History of trauma from a fall.  EXAM: CHEST - 1 VIEW  COMPARISON:  Chest x-ray 11/29/2013.  FINDINGS: Lung volumes are  normal. No consolidative airspace disease. No pleural effusions. No pneumothorax. No pulmonary nodule or mass noted. Pulmonary vasculature and the cardiomediastinal silhouette are within normal limits. Visualize bony thorax is grossly intact. Status post median sternotomy. Left atrial appendage ligation clip noted. Atherosclerosis in the thoracic aorta.  IMPRESSION: 1. No evidence of significant acute traumatic injury to the thorax on this plain film examination. 2. Atherosclerosis. 3. Postoperative changes, as above.   Electronically Signed   By: Vinnie Langton M.D.   On: 12/07/2013 19:53   Dg Cervical Spine Complete  12/07/2013   CLINICAL DATA:  Post fall, hit head  EXAM: CERVICAL SPINE  4+ VIEWS  COMPARISON:  None.  FINDINGS: C1 to the superior endplate of T1 is imaged on the provided supine cross-table lateral radiograph.  Normal alignment of the cervical spine. No definite anterolisthesis or retrolisthesis. The bilateral facets appear aligned given obliquity. The dens is normally positioned between the lateral masses of C1.  Cervical vertebral body heights are preserved. Prevertebral soft tissues are normal.  There is moderate to severe multilevel cervical spine a DDD, likely worse at C5-C6 and C6-C7 and to a lesser extent, C3-C4 and C4-C5 with disc space height loss, endplate irregularity and sclerosis.  Atherosclerotic plaque within the bilateral carotid bulbs.  Limited visualization of lung apices demonstrates sequela of prior median sternotomy. Atherosclerotic plaque within the aortic arch.  IMPRESSION: 1. No definite acute findings. Further evaluation could be performed  with cervical spine CT as clinically indicated. 2. Moderate to severe multilevel cervical spine DDD.   Electronically Signed   By: Sandi Mariscal M.D.   On: 12/07/2013 19:58   Dg Hip Complete Right  12/07/2013   CLINICAL DATA:  Post fall, now with right-sided hip pain  EXAM: RIGHT HIP - COMPLETE 2+ VIEW  COMPARISON:  None.  FINDINGS: There is a minimally displaced right-sided subcapital neck femur fracture with associated minimal foreshortening. No definite intra-articular extension. No definite associated pelvic fracture. Limited visualization of the contralateral left hip is normal. Mild scoliotic curvature of the lower lumbar spine, convex to the left with associated suspected mild to moderate multilevel DDD. Regional soft tissues appear normal.  IMPRESSION: Minimally displaced right-sided subcapital femoral neck fracture.   Electronically Signed   By: Sandi Mariscal M.D.   On: 12/07/2013 19:55   Ct Head Wo Contrast  12/07/2013   CLINICAL DATA:  Post fall, hit back of head now with hematoma, no loss of consciousness.  EXAM: CT HEAD WITHOUT CONTRAST  TECHNIQUE: Contiguous axial images were obtained from the base of the skull through the vertex without intravenous contrast.  COMPARISON:  None.  FINDINGS: There is mild likely age-appropriate atrophy with mild diffuse sulcal prominence. Scattered periventricular hypodensities compatible with microvascular ischemic disease. Given background parenchymal abnormalities, there is no CT evidence of acute superimposed large territory infarct. No definite intraparenchymal or extra-axial mass or hemorrhage. Normal size and configuration of the ventricles and basilar cisterns. No midline shift. Limited visualization of the paranasal sinuses and mastoid air cells are normal.  Regional soft tissues are normal with special attention paid to the occiput. Post bilateral cataract surgery. No displaced calvarial fracture.  IMPRESSION: Mild likely age-appropriate atrophy and  microvascular ischemic disease without acute intracranial process.   Electronically Signed   By: Sandi Mariscal M.D.   On: 12/07/2013 19:31    ROS the patient denies any specific shortness of breath or chest pain or loss of consciousness. Blood pressure 103/41, pulse 70, temperature 98.1 F (36.7  C), temperature source Oral, resp. rate 16, height 5' 1.5" (1.562 m), weight 44.453 kg (98 lb), SpO2 93.00%. Physical Exam She is holding her right hip flexed about 20 any attempts at motion causes significant pain she can move her toes up and down without difficulty but does have mildly diminished sensation to her feet. Her pulses are very hard to palpate down by her foot. Assessment/Plan: Assessment: Valgus impacted right femoral neck fracture in an 80 year old woman who has known osteoporosis and otherwise wants to be quite active  Plan: Ideally hemiarthroplasty will allow her to get up and moving as quickly as possible. His may carry significant risk based on her cardiac condition. I'll try to get hold of Dr. Arnoldo Morale regarding preoperative clearance. We made an opinion from her cardiologist or her heart surgeon.  Taggart Prasad J 12/08/2013, 8:41 AM

## 2013-12-08 NOTE — Progress Notes (Signed)
Have seen and assessed the patient and agree with Dr. Arnoldo Morale assessment and plan. Per Dr. Arnoldo Morale note she spoke with cardiology and patient's cardiac history was discussed and reviewed in addition to recent CABG and 11 2014 and is clear from a cardiac standpoint to undergo surgery. Patient also noted to be hyponatremic on diuretics and SSRI. Spironolactone has been held. Will hold Zoloft as well. Check a urine sodium. Check a urine creatinine. Check a serum osmolality check a urine osmolality. Chest x-ray with no acute abnormalities. Patient with recent TSH of 4.57 on 11/05/2013. Will give a bolus of 250 cc normal saline x1. Gentle hydration. Repeat basic metabolic profile this afternoon. Patient also noted to have a recent DVT on chronic xaralto which has been held over the past 36 hours. Will place patient on IV heparin as DVT was extensive. IV heparin can be turned off prior to surgery. Orthopedics is following and appreciate input and recommendations.

## 2013-12-08 NOTE — Progress Notes (Signed)
Critical value Na+ 121 forwarded to Dr Grandville Silos, MD.

## 2013-12-08 NOTE — Telephone Encounter (Signed)
I discussed with husband and made med adjustment to Spironolactone 12.5 mg daily

## 2013-12-08 NOTE — Consult Note (Addendum)
Renal Service Consult Note [facility]  Maria [name] 12/08/2013 [place] Requesting Physician:  Dr Grandville Silos  Reason for Consult:  Hyponatremia HPI: The patient is a 80 y.o. year-old with hx of HTN many yrs, mitral regurgitation, pulm HTN by echo was diagnosed with 2V CAD in Nov 2014 and underwent CABG. She had postop afib controlled with amio and MTP, converted to NSR prior to discharge. 6 weeks later she presented with progressive left leg edema and was diagnosed with LLE DVT.  She was admitted and treated with I V heparin and then Xarelto.  LLE swelling improved.  She had issues with low Na levels on both hospital stays, the last stay levels were 125-129. Family noted that she had had problems in the past with low Na levels with HCTZ and then also when off of thiazide diuretics.  Patient is now admitted with a fall and R hip fracture. Serum Na was 124 on presentation, she received IVF's with NS about 1 liter and repeat Na was 121.  Renal service asked to assist with hyponatremia.   Patient is without complaints except for R hip pain. No sob, confusion, HA or CP. No recent n/v/d. No voiding problems. No hx of renal disease.     ROS  no skin rash  no jt pain  no cough  no abd pain  no confusion  Past Medical History  Past Medical History  Diagnosis Date  . Osteoporosis   . Celiac disease   . Celiac disease   . Carotid artery occlusion     right carotid stenosis 40-60%, 4/08, neg for stenosis repeat study 11/11  . Depression   . Presbycusis   . Macular degeneration   . Urethral stricture   . Moderate aortic insufficiency   . Moderate mitral regurgitation by prior echocardiogram     echo 9/13   . Thyroid nodule     53m, no change 11/11  . RLS (restless legs syndrome)   . Vitamin D deficiency   . Heart murmur   . Pulmonary hypertension   . Hypertension     takes Metoprolol daily  . Atrial fibrillation   . Coronary artery disease   . CHF  (congestive heart failure)     takes Lasix daily  . MVP (mitral valve prolapse)   . Moderate obstructive sleep apnea     uses CPAP;sleep study about 4-549monthago  . Hearing loss     both ears  . Numbness     left arm  . Joint swelling   . GERD (gastroesophageal reflux disease)     takes Omeprazole daily  . Gastric ulcer     6-13m11monthgo  . GI bleeding   . Constipation   . Diarrhea   . Hemorrhoids   . Urinary retention   . Urinary anastomotic stricture   . Hypothyroidism     takes Synthroid daily as result of AMiodarone  . Hot flashes     takes ZOloft 3 times a week  . Insomnia     takes Melatonin nightly  . Restless leg   . Peripheral edema     left  . DVT (deep venous thrombosis)    Past Surgical History  Past Surgical History  Procedure Laterality Date  . Appendectomy    . Tubal ligation    . Tonsillectomy    . Trigger thumb    . Cardiac catheterization  08-06-13  . Tcs    . Bilateral cataract surgery    .  Mandible surgery    . Coronary artery bypass graft N/A 08/28/2013    Procedure: CORONARY ARTERY BYPASS GRAFTING (CABG) x2 using right greater saphenous vein and left internal mammary artery. ;  Surgeon: Grace Isaac, MD;  Location: [place];  Service: Open Heart Surgery;  Laterality: N/A;  . Intraoperative transesophageal echocardiogram N/A 08/28/2013    Procedure: INTRAOPERATIVE TRANSESOPHAGEAL ECHOCARDIOGRAM;  Surgeon: Grace Isaac, MD;  Location: [place];  Service: Open Heart Surgery;  Laterality: N/A;   Family History  Family History  Problem Relation Age of Onset  . Heart attack Mother   . CVA Mother   . CAD Father   . Colon cancer Father   . Heart attack Father   . Heart attack Brother   . Peripheral vascular disease Brother   . AAA (abdominal aortic aneurysm) Brother    Social History  reports that she has never smoked. She has never used smokeless tobacco. She reports that she does not drink alcohol or use illicit drugs. Allergies   Allergies  Allergen Reactions  . Amoxicillin Other (See Comments)    Nausea, dizziness  . Codeine Nausea And Vomiting  . Fosamax [Alendronate Sodium] Other (See Comments)    Jaw pain  . Gluten Meal Other (See Comments)    Celiac Disease  . Hctz [Hydrochlorothiazide] Other (See Comments)    Hyponatremia, GI upset  . Tetanus Toxoids Other (See Comments)    Bad local reaction   Home medications Prior to Admission medications   Medication Sig Start Date End Date Taking? Authorizing Provider  acetaminophen (TYLENOL) 325 MG tablet Take 650 mg by mouth every 6 (six) hours as needed.   Yes Historical Provider, MD  docusate sodium (COLACE) 100 MG capsule Take 100 mg by mouth daily as needed.    Yes Historical Provider, MD  levothyroxine (SYNTHROID, LEVOTHROID) 50 MCG tablet Take 1 tablet (50 mcg total) by mouth daily before breakfast. 11/30/13  Yes [place], MD  magnesium hydroxide (MILK OF MAGNESIA) 400 MG/5ML suspension Take 5 mLs by mouth daily as needed for mild constipation.    Yes Historical Provider, MD  Melatonin 3 MG TABS Take 1 tablet by mouth at bedtime as needed (sleep).   Yes Historical Provider, MD  MULTIPLE VITAMIN PO Take 1 tablet by mouth daily.   Yes Historical Provider, MD  omeprazole (PRILOSEC) 20 MG capsule Take 20 mg by mouth every other day.    Yes Historical Provider, MD  Rivaroxaban (XARELTO) 20 MG TABS tablet Take 1 tablet (20 mg total) by mouth daily with supper. Start January 3rd. 10/05/13  Yes Belva Crome III, MD  sertraline (ZOLOFT) 50 MG tablet Take 50 mg by mouth every other day.   Yes Historical Provider, MD  spironolactone (ALDACTONE) 25 MG tablet Take 1 tablet (25 mg total) by mouth daily. 11/30/13  Yes [place], MD  LORazepam (ATIVAN) 0.5 MG tablet Take 1 tablet (0.5 mg total) by mouth at bedtime. 11/30/13   Belva Crome III, MD   Liver Function Tests No results found for this basename: AST, ALT, ALKPHOS, BILITOT, PROT, ALBUMIN,  in the last  168 hours No results found for this basename: LIPASE, AMYLASE,  in the last 168 hours CBC  Recent Labs Lab 12/07/13 2028 12/08/13 0543  WBC 10.7* 9.2  NEUTROABS 8.6*  --   HGB 11.1* 11.1*  HCT 32.0* 32.4*  MCV 79.8 80.2  PLT 360 010   Basic Metabolic Panel  Recent  Labs Lab 12/07/13 2028 12/08/13 0543 12/08/13 1405  NA 124* 125* 121*  K 4.5 4.8 4.6  CL 89* 92* 90*  CO2 20 21 19   GLUCOSE 97 93 82  BUN 13 10 11   CREATININE 0.70 0.59 0.63  CALCIUM 9.1 8.9 8.4    Exam  Blood pressure 119/47, pulse 80, temperature 99.1 F (37.3 C), temperature source Oral, resp. rate 16, height 5' 1.5" (1.562 m), weight 44.453 kg (98 lb), SpO2 91.00%. Alert, elderly female, WD, WN No rash, cyanosis or gangrene Sclera anicteric, throat clear Mild jvd, jvp about 10 Soft insp rales L base, R clear RRR displaced and sustained PMI, no gallop Abd benign, soft, nt, nd, no ascites No LE or UE edema No ulcer or gangrene Neuro is alert, ox3, nf  Uosm 481, UNa 39 Serum Na 124 > 121 after 1 L NS CXR clear Creat 0.6 Last echo EF 60%, pulm HTN, MV regurg UA 21-50wbc, 3-6rbc Na level Dec 2014 was 125-129,  Na level Nov 2014 was 124-137  Assessment: 1 Hyponatremia- high Una and Uosm suggestive of SIADH.  Has been off of SSRI for 3 days.  Reset osmostat is another possibility. Looks euvolemic and Na got worse with 0.9% NS today which argues against vol depletion.  Recent TSH ok, check cortisol and give tolvaptan. Will follow.  2 R hip fracture after fall 3 Hx of CHF (diastolic) 4 HTN long history, no longer hypertensive after CABG in November 5 CAD hx CABG Nov 2014 6 Left LE DVT Dec 2014   Plan- Tolvaptan, follow Na every 6 hours for now, d/c toradol.   Kelly Splinter MD (pgr) [phone]    ([phone] 12/08/2013, 7:11 PM

## 2013-12-09 ENCOUNTER — Encounter (HOSPITAL_COMMUNITY)
Admission: EM | Disposition: A | Discharge status: Rehabilitation Facility | Payer: Self-pay | Source: Home / Self Care | Attending: Internal Medicine

## 2013-12-09 ENCOUNTER — Encounter (HOSPITAL_COMMUNITY): Payer: Medicare Other | Admitting: Anesthesiology

## 2013-12-09 ENCOUNTER — Inpatient Hospital Stay (HOSPITAL_COMMUNITY): Payer: Medicare Other | Admitting: Anesthesiology

## 2013-12-09 ENCOUNTER — Inpatient Hospital Stay (HOSPITAL_COMMUNITY): Payer: Medicare Other

## 2013-12-09 DIAGNOSIS — I5032 Chronic diastolic (congestive) heart failure: Secondary | ICD-10-CM

## 2013-12-09 HISTORY — PX: HIP ARTHROPLASTY: SHX981

## 2013-12-09 LAB — BASIC METABOLIC PANEL
BUN: 11 mg/dL (ref 6–23)
BUN: 9 mg/dL (ref 6–23)
CALCIUM: 8.8 mg/dL (ref 8.4–10.5)
CO2: 21 mEq/L (ref 19–32)
CO2: 21 mEq/L (ref 19–32)
Calcium: 8.9 mg/dL (ref 8.4–10.5)
Chloride: 94 mEq/L — ABNORMAL LOW (ref 96–112)
Chloride: 97 mEq/L (ref 96–112)
Creatinine, Ser: 0.67 mg/dL (ref 0.50–1.10)
Creatinine, Ser: 0.59 mg/dL (ref 0.50–1.10)
GFR calc Af Amer: 89 mL/min — ABNORMAL LOW (ref >90)
GFR calc Af Amer: >90 mL/min (ref >90)
GFR calc non Af Amer: 77 mL/min — ABNORMAL LOW (ref >90)
GFR, EST NON AFRICAN AMERICAN: 80 mL/min — AB (ref >90)
GLUCOSE: 83 mg/dL (ref 70–99)
Glucose, Bld: 90 mg/dL (ref 70–99)
POTASSIUM: 4.5 meq/L (ref 3.7–5.3)
Potassium: 4.8 mEq/L (ref 3.7–5.3)
SODIUM: 127 meq/L — AB (ref 137–147)
Sodium: 130 mEq/L — ABNORMAL LOW (ref 137–147)

## 2013-12-09 LAB — TYPE AND SCREEN
ABO/RH(D): O POS
Antibody Screen: NEGATIVE

## 2013-12-09 LAB — HEPARIN LEVEL (UNFRACTIONATED)
HEPARIN UNFRACTIONATED: 0.17 [IU]/mL — AB (ref 0.30–0.70)
Heparin Unfractionated: 0.16 IU/mL — ABNORMAL LOW (ref 0.30–0.70)

## 2013-12-09 LAB — CBC
HCT: 31.7 % — ABNORMAL LOW (ref 36.0–46.0)
Hemoglobin: 10.6 g/dL — ABNORMAL LOW (ref 12.0–15.0)
MCH: 27 pg (ref 26.0–34.0)
MCHC: 33.4 g/dL (ref 30.0–36.0)
MCV: 80.7 fL (ref 78.0–100.0)
PLATELETS: 315 10*3/uL (ref 150–400)
RBC: 3.93 MIL/uL (ref 3.87–5.11)
RDW: 16 % — ABNORMAL HIGH (ref 11.5–15.5)
WBC: 8.9 10*3/uL (ref 4.0–10.5)

## 2013-12-09 LAB — MRSA PCR SCREENING: MRSA by PCR: NEGATIVE

## 2013-12-09 LAB — CORTISOL: Cortisol, Plasma: 7.5 ug/dL

## 2013-12-09 LAB — SODIUM
SODIUM: 133 meq/L — AB (ref 137–147)
Sodium: 131 mEq/L — ABNORMAL LOW (ref 137–147)

## 2013-12-09 SURGERY — HEMIARTHROPLASTY, HIP, DIRECT ANTERIOR APPROACH, FOR FRACTURE
Anesthesia: General | Laterality: Right

## 2013-12-09 MED ORDER — METOCLOPRAMIDE HCL 10 MG PO TABS: 5.0000 mg | ORAL_TABLET | Freq: Three times a day (TID) | ORAL | Status: DC | PRN

## 2013-12-09 MED ORDER — PHENYLEPHRINE HCL 10 MG/ML IJ SOLN
10.0000 mg | INTRAVENOUS | Status: DC | PRN
  Administered 2013-12-09: 40 ug/min via INTRAVENOUS

## 2013-12-09 MED ORDER — FENTANYL CITRATE 0.05 MG/ML IJ SOLN
INTRAMUSCULAR | Status: AC
  Filled 2013-12-09: qty 5

## 2013-12-09 MED ORDER — HYDROMORPHONE HCL PF 1 MG/ML IJ SOLN: 0.2500 mg | INTRAMUSCULAR | Status: DC | PRN

## 2013-12-09 MED ORDER — BUPIVACAINE-EPINEPHRINE (PF) 0.25% -1:200000 IJ SOLN
INTRAMUSCULAR | Status: AC
  Filled 2013-12-09: qty 30

## 2013-12-09 MED ORDER — DEXTROSE 5 % IV SOLN
1.0000 g | INTRAVENOUS | Status: DC
  Filled 2013-12-09: qty 10

## 2013-12-09 MED ORDER — MEPERIDINE HCL 25 MG/ML IJ SOLN: 6.2500 mg | INTRAMUSCULAR | Status: DC | PRN

## 2013-12-09 MED ORDER — LEVOTHYROXINE SODIUM 50 MCG PO TABS
50.0000 ug | ORAL_TABLET | Freq: Every day | ORAL | Status: DC
  Administered 2013-12-10 – 2013-12-12 (×3): 50 ug via ORAL
  Filled 2013-12-09 (×5): qty 1

## 2013-12-09 MED ORDER — OXYCODONE HCL 5 MG/5ML PO SOLN: 5.0000 mg | Freq: Once | ORAL | Status: DC | PRN

## 2013-12-09 MED ORDER — CEFUROXIME SODIUM 1.5 G IJ SOLR
INTRAMUSCULAR | Status: AC
  Filled 2013-12-09: qty 1.5

## 2013-12-09 MED ORDER — SODIUM CHLORIDE 0.9 % IV SOLN
INTRAVENOUS | Status: DC | PRN
  Administered 2013-12-09 (×2): via INTRAVENOUS

## 2013-12-09 MED ORDER — ROCURONIUM BROMIDE 100 MG/10ML IV SOLN
INTRAVENOUS | Status: DC | PRN
  Administered 2013-12-09: 40 mg via INTRAVENOUS

## 2013-12-09 MED ORDER — BUPIVACAINE-EPINEPHRINE 0.5% -1:200000 IJ SOLN
INTRAMUSCULAR | Status: DC | PRN
  Administered 2013-12-09: 20 mL

## 2013-12-09 MED ORDER — PROPOFOL 10 MG/ML IV BOLUS
INTRAVENOUS | Status: AC
  Filled 2013-12-09: qty 20

## 2013-12-09 MED ORDER — NEOSTIGMINE METHYLSULFATE 1 MG/ML IJ SOLN
INTRAMUSCULAR | Status: DC | PRN
  Administered 2013-12-09: 2 mg via INTRAVENOUS

## 2013-12-09 MED ORDER — FENTANYL CITRATE 0.05 MG/ML IJ SOLN
INTRAMUSCULAR | Status: DC | PRN
  Administered 2013-12-09: 75 ug via INTRAVENOUS
  Administered 2013-12-09: 50 ug via INTRAVENOUS
  Administered 2013-12-09: 75 ug via INTRAVENOUS
  Administered 2013-12-09: 50 ug via INTRAVENOUS

## 2013-12-09 MED ORDER — NEOSTIGMINE METHYLSULFATE 1 MG/ML IJ SOLN
INTRAMUSCULAR | Status: AC
  Filled 2013-12-09: qty 10

## 2013-12-09 MED ORDER — ONDANSETRON HCL 4 MG/2ML IJ SOLN
INTRAMUSCULAR | Status: AC
  Filled 2013-12-09: qty 2

## 2013-12-09 MED ORDER — LIDOCAINE HCL (CARDIAC) 20 MG/ML IV SOLN
INTRAVENOUS | Status: AC
  Filled 2013-12-09: qty 5

## 2013-12-09 MED ORDER — PHENYLEPHRINE HCL 10 MG/ML IJ SOLN
INTRAMUSCULAR | Status: DC | PRN
  Administered 2013-12-09: 60 ug via INTRAVENOUS
  Administered 2013-12-09 (×2): 80 ug via INTRAVENOUS
  Administered 2013-12-09: 60 ug via INTRAVENOUS

## 2013-12-09 MED ORDER — MENTHOL 3 MG MT LOZG: 1.0000 | LOZENGE | OROMUCOSAL | Status: DC | PRN

## 2013-12-09 MED ORDER — SODIUM CHLORIDE 1 G PO TABS
2.0000 g | ORAL_TABLET | Freq: Three times a day (TID) | ORAL | Status: DC
  Administered 2013-12-09 – 2013-12-12 (×9): 2 g via ORAL
  Filled 2013-12-09 (×11): qty 2

## 2013-12-09 MED ORDER — KCL IN DEXTROSE-NACL 20-5-0.45 MEQ/L-%-% IV SOLN
INTRAVENOUS | Status: DC
  Administered 2013-12-10: 04:00:00 via INTRAVENOUS
  Filled 2013-12-09 (×3): qty 1000

## 2013-12-09 MED ORDER — PROPOFOL 10 MG/ML IV BOLUS
INTRAVENOUS | Status: DC | PRN
  Administered 2013-12-09: 8 mg via INTRAVENOUS

## 2013-12-09 MED ORDER — GLYCOPYRROLATE 0.2 MG/ML IJ SOLN
INTRAMUSCULAR | Status: DC | PRN
  Administered 2013-12-09: 0.4 mg via INTRAVENOUS

## 2013-12-09 MED ORDER — RIVAROXABAN 20 MG PO TABS
20.0000 mg | ORAL_TABLET | Freq: Every day | ORAL | Status: DC
  Administered 2013-12-09 – 2013-12-12 (×4): 20 mg via ORAL
  Filled 2013-12-09 (×4): qty 1

## 2013-12-09 MED ORDER — METOCLOPRAMIDE HCL 5 MG/ML IJ SOLN: 5.0000 mg | Freq: Three times a day (TID) | INTRAMUSCULAR | Status: DC | PRN

## 2013-12-09 MED ORDER — ONDANSETRON HCL 4 MG/2ML IJ SOLN: 4.0000 mg | Freq: Once | INTRAMUSCULAR | Status: DC | PRN

## 2013-12-09 MED ORDER — OXYCODONE HCL 5 MG PO TABS: 5.0000 mg | ORAL_TABLET | Freq: Once | ORAL | Status: DC | PRN

## 2013-12-09 MED ORDER — MAGNESIUM HYDROXIDE 400 MG/5ML PO SUSP: 5.0000 mL | Freq: Every day | ORAL | Status: DC | PRN

## 2013-12-09 MED ORDER — ONDANSETRON HCL 4 MG/2ML IJ SOLN
INTRAMUSCULAR | Status: DC | PRN
  Administered 2013-12-09: 4 mg via INTRAVENOUS

## 2013-12-09 MED ORDER — ROCURONIUM BROMIDE 50 MG/5ML IV SOLN
INTRAVENOUS | Status: AC
  Filled 2013-12-09: qty 1

## 2013-12-09 MED ORDER — PHENYLEPHRINE HCL 10 MG/ML IJ SOLN
INTRAMUSCULAR | Status: AC
  Filled 2013-12-09: qty 1

## 2013-12-09 MED ORDER — CEFUROXIME SODIUM 1.5 G IJ SOLR
INTRAMUSCULAR | Status: DC | PRN
  Administered 2013-12-09: 1.5 g

## 2013-12-09 MED ORDER — DOCUSATE SODIUM 100 MG PO CAPS: 100.0000 mg | ORAL_CAPSULE | Freq: Every day | ORAL | Status: DC | PRN

## 2013-12-09 MED ORDER — PHENOL 1.4 % MT LIQD: 1.0000 | OROMUCOSAL | Status: DC | PRN

## 2013-12-09 MED ORDER — MORPHINE SULFATE 2 MG/ML IJ SOLN: 1.0000 mg | INTRAMUSCULAR | Status: DC | PRN

## 2013-12-09 MED ORDER — HYDROCODONE-ACETAMINOPHEN 5-325 MG PO TABS
1.0000 | ORAL_TABLET | Freq: Four times a day (QID) | ORAL | Status: DC | PRN
  Administered 2013-12-09: 1 via ORAL
  Filled 2013-12-09: qty 1

## 2013-12-09 MED ORDER — LIDOCAINE HCL (CARDIAC) 20 MG/ML IV SOLN
INTRAVENOUS | Status: DC | PRN
  Administered 2013-12-09: 100 mg via INTRAVENOUS

## 2013-12-09 SURGICAL SUPPLY — 61 items
BLADE SAW SAG 73X25 THK (BLADE) ×2
BLADE SAW SGTL 73X25 THK (BLADE) ×1 IMPLANT
BRUSH FEMORAL CANAL (MISCELLANEOUS) ×2 IMPLANT
CAPT HIP FX BIPOLAR/UNIPOLAR ×2 IMPLANT
CEMENT BONE DEPUY (Cement) ×4 IMPLANT
CLOTH BEACON ORANGE TIMEOUT ST (SAFETY) ×3 IMPLANT
COVER BACK TABLE 24X17X13 BIG (DRAPES) IMPLANT
DRAPE ORTHO SPLIT 77X108 STRL (DRAPES) ×6
DRAPE PROXIMA HALF (DRAPES) ×3 IMPLANT
DRAPE SURG ORHT 6 SPLT 77X108 (DRAPES) ×2 IMPLANT
DRAPE U-SHAPE 47X51 STRL (DRAPES) ×3 IMPLANT
DRILL BIT 7/64X5 (BIT) ×3 IMPLANT
DRSG AQUACEL AG ADV 3.5X10 (GAUZE/BANDAGES/DRESSINGS) ×3 IMPLANT
DRSG PAD ABDOMINAL 8X10 ST (GAUZE/BANDAGES/DRESSINGS) ×3 IMPLANT
DURAPREP 26ML APPLICATOR (WOUND CARE) ×3 IMPLANT
ELECT BLADE 4.0 EZ CLEAN MEGAD (MISCELLANEOUS) ×3
ELECT BLADE 6.5 EXT (BLADE) IMPLANT
ELECT CAUTERY BLADE 6.4 (BLADE) ×3 IMPLANT
ELECT REM PT RETURN 9FT ADLT (ELECTROSURGICAL) ×3
ELECTRODE BLDE 4.0 EZ CLN MEGD (MISCELLANEOUS) ×1 IMPLANT
ELECTRODE REM PT RTRN 9FT ADLT (ELECTROSURGICAL) ×1 IMPLANT
EVACUATOR 1/8 PVC DRAIN (DRAIN) IMPLANT
GAUZE XEROFORM 5X9 LF (GAUZE/BANDAGES/DRESSINGS) ×3 IMPLANT
GLOVE BIO SURGEON STRL SZ7.5 (GLOVE) ×3 IMPLANT
GLOVE BIO SURGEON STRL SZ8.5 (GLOVE) ×3 IMPLANT
GLOVE BIOGEL PI IND STRL 8 (GLOVE) ×1 IMPLANT
GLOVE BIOGEL PI IND STRL 9 (GLOVE) ×1 IMPLANT
GLOVE BIOGEL PI INDICATOR 8 (GLOVE) ×2
GLOVE BIOGEL PI INDICATOR 9 (GLOVE) ×2
GOWN PREVENTION PLUS XLARGE (GOWN DISPOSABLE) ×6 IMPLANT
GOWN STRL NON-REIN LRG LVL3 (GOWN DISPOSABLE) ×3 IMPLANT
GOWN STRL REIN XL XLG (GOWN DISPOSABLE) ×3 IMPLANT
HANDPIECE INTERPULSE COAX TIP (DISPOSABLE) ×3
IMMOBILIZER KNEE 20 (SOFTGOODS) IMPLANT
KIT BASIN OR (CUSTOM PROCEDURE TRAY) ×3 IMPLANT
KIT ROOM TURNOVER OR (KITS) ×3 IMPLANT
MANIFOLD NEPTUNE II (INSTRUMENTS) ×3 IMPLANT
NDL 1/2 CIR MAYO (NEEDLE) IMPLANT
NEEDLE 1/2 CIR MAYO (NEEDLE) IMPLANT
NS IRRIG 1000ML POUR BTL (IV SOLUTION) ×3 IMPLANT
PACK TOTAL JOINT (CUSTOM PROCEDURE TRAY) ×3 IMPLANT
PAD ARMBOARD 7.5X6 YLW CONV (MISCELLANEOUS) ×6 IMPLANT
PASSER SUT SWANSON 36MM LOOP (INSTRUMENTS) IMPLANT
SET HNDPC FAN SPRY TIP SCT (DISPOSABLE) IMPLANT
SPONGE GAUZE 4X4 12PLY (GAUZE/BANDAGES/DRESSINGS) ×3 IMPLANT
STAPLER VISISTAT 35W (STAPLE) ×3 IMPLANT
SUCTION FRAZIER TIP 10 FR DISP (SUCTIONS) IMPLANT
SUT ETHIBOND 2 V 37 (SUTURE) ×3 IMPLANT
SUT ETHILON 3 0 FSL (SUTURE) IMPLANT
SUT PASSER 2.0 195M (MISCELLANEOUS) ×3 IMPLANT
SUT VIC AB 0 CT1 27 (SUTURE) ×3
SUT VIC AB 0 CT1 27XBRD ANBCTR (SUTURE) ×1 IMPLANT
SUT VIC AB 1 CTX 36 (SUTURE) ×3
SUT VIC AB 1 CTX36XBRD ANBCTR (SUTURE) ×1 IMPLANT
SUT VIC AB 2-0 CT1 36 (SUTURE) ×3 IMPLANT
SYR CONTROL 10ML LL (SYRINGE) IMPLANT
TOWEL OR 17X24 6PK STRL BLUE (TOWEL DISPOSABLE) ×3 IMPLANT
TOWEL OR 17X26 10 PK STRL BLUE (TOWEL DISPOSABLE) ×3 IMPLANT
TOWER CARTRIDGE SMART MIX (DISPOSABLE) ×2 IMPLANT
TRAY FOLEY CATH 14FR (SET/KITS/TRAYS/PACK) IMPLANT
WATER STERILE IRR 1000ML POUR (IV SOLUTION) ×12 IMPLANT

## 2013-12-09 NOTE — Anesthesia Procedure Notes (Signed)
Procedure Name: Intubation Date/Time: 12/09/2013 2:54 PM Performed by: Marinda Elk A Pre-anesthesia Checklist: Patient identified, Timeout performed, Emergency Drugs available, Suction available and Patient being monitored Patient Re-evaluated:Patient Re-evaluated prior to inductionOxygen Delivery Method: Circle system utilized Preoxygenation: Pre-oxygenation with 100% oxygen Intubation Type: IV induction Ventilation: Mask ventilation without difficulty Laryngoscope Size: Mac and 3 Grade View: Grade II Tube type: Oral Tube size: 7.5 mm Number of attempts: 1 Airway Equipment and Method: Stylet Secured at: 20 cm Tube secured with: Tape Dental Injury: Teeth and Oropharynx as per pre-operative assessment

## 2013-12-09 NOTE — Transfer of Care (Signed)
Immediate Anesthesia Transfer of Care Note  Patient: Maria [name]  Procedure(s) Performed: Procedure(s): ARTHROPLASTY BIPOLAR HIP CEMENTED (Right)  Patient Location: PACU  Anesthesia Type:General  Level of Consciousness: sedated  Airway & Oxygen Therapy: Patient Spontanous Breathing and Patient connected to nasal cannula oxygen  Post-op Assessment: Report given to PACU RN and Post -op Vital signs reviewed and stable  Post vital signs: Reviewed and stable  Complications: No apparent anesthesia complications

## 2013-12-09 NOTE — Anesthesia Postprocedure Evaluation (Signed)
Anesthesia Post Note  Patient: Maria [name]  Procedure(s) Performed: Procedure(s) (LRB): ARTHROPLASTY BIPOLAR HIP CEMENTED (Right)  Anesthesia type: general  Patient location: PACU  Post pain: Pain level controlled  Post assessment: Patient's Cardiovascular Status Stable  Last Vitals:  Filed Vitals:   12/09/13 1645  BP: 173/64  Pulse: 89  Temp:   Resp: 26    Post vital signs: Reviewed and stable  Level of consciousness: sedated  Complications: No apparent anesthesia complications

## 2013-12-09 NOTE — Op Note (Signed)
Pre Op Dx: R femoral neck fracture   Post Op Dx: Same   Procedure: R hemi-hip arthroplasty using DePuy 53 mm monopolar head, +0 neck, #3 Summit basic stem, 13 mm tip, #4 cement restrictor, double batch Depuy 1 cement with 1500 mg of Zinacef.  Surgeon: Kerin Salen, MD  Assistant: Kerry Hough. Barton Dubois  (present throughout entire procedure and necessary for timely completion of the procedure)  Anesthesia: General  EBL: 300 cc  Fluids: 1500 cc of crystalloid  Tourniquet Time: Not applicable  Indications: Independent living patient slipped and fell at home. Was transported to the emergency room with a presumed R hip fracture confirmed by x-ray. To decrease pain increase function and decreased morbidity have recommended and the patient has consented to hemi-hip arthroplasty. Risks and benefits of surgery have been discussed with the patient and family. All questions have been answered.   Procedure: Patient was identified by arm band receive preoperative IV antibiotics in the holding area at the hospital. She was taken to operating room for the appropriate anesthetic monitors were attached and general LMA anesthesia induced with the patient in the supine position. She was then rolled into the L lateral decubitus position and fixed there with a pelvic clamp. The R lower extremity was then prepped and draped in usual sterile fashion from the ankle to the hemipelvis and a timeout procedure performed. A 12 cm incision centered over the greater trochanter allowing a lateral to posterior lateral approach to the hip was then made through the skin and subcutaneous tissue. The IT band was cut in line with skin incision at which point we encountered hematoma. Cobra retractors were placed on the superior hip joint capsule and along the inferior hip joint capsule as well. The Piriformis and short external rotators were tagged and cut after insertion on the intertrochanteric crest and a posterior capsular flap was  developed going posterior superior off the acetabulum out over the fractured femoral neck exiting posterior inferior. This exposed the fracture, the hip was internally rotated and a standard neck cut was performed with an oscillating saw 1 fingerbreadth above the lesser trochanter. This allowed [place] to extracted the femoral head, which measured at 53 mm. Trial reductions were then performed with a 52 and 53 mm trial head on a stick and the 53 mm head had the best fit and fill. There was then flexed and internally rotated exposing the proximal femur which was entered with the initiating reamer, the lateral reamer and then broaches up to a #3 broach which had the best fit and fill to the neck cut. The neck cut was then finished with the neck reamer. And a +0 53 mm trial head was placed on the broach, the hip reduced and stability was found to be excellent he could flex to 90 and 75 of internal rotation and in full extension the hip could not be dislocated in external rotation. We then size for a #4 cement restrictor which was inserted without difficulty and the proximal femur was pulse device clean and dried with suction and sponges. A double batch of DePuy 1 cement was then mixed with 1500 mg of Zinacef and injected into the proximal femur under pressure followed by a #3 Summit basic stem with a 13 mm cementralizer. The stem was held in compression and about 15 of anteversion as the cement cured. A +0 53 mm monopolar head was then hammered onto the stem the hip reduced and stability again noted to be excellent. The  wound is thoroughly irrigated out normal saline solution. Capsular flap and short external rotators repaired back to the intertrochanteric crest a drill holes with a #2 Ethibond suture. The IT band was closed with running #1 Vicryl suture, the subcutaneous tissue with 0 and 2-0 undyed Vicryl suture and the skin with running interlocking 3-0 nylon suture. A dressing of Mepilex was then applied. The patient  was unclamped rolled supine, awakened extubated and taken to the recovery room without difficulty.

## 2013-12-09 NOTE — Progress Notes (Signed)
Orthopedic Tech Progress Note Patient Details:  Maria [name] Jul 28, 1924 [id number]  Ortho Devices Ortho Device/Splint Location: trapeze bar patient helper   Hildred Priest 12/09/2013, 8:03 PM

## 2013-12-09 NOTE — Progress Notes (Signed)
ANTICOAGULATION CONSULT NOTE - Follow Up Consult  Pharmacy Consult for heparin Indication: recent DVT  Labs:  Recent Labs  12/07/13 2028 12/08/13 0543 12/08/13 1405 12/08/13 1830 12/09/13 0158  HGB 11.1* 11.1*  --   --  10.6*  HCT 32.0* 32.4*  --   --  31.7*  PLT 360 373  --   --  315  APTT  --   --   --  41*  --   LABPROT 17.0*  --   --   --   --   INR 1.42  --   --   --   --   HEPARINUNFRC  --   --   --  0.18* 0.17*  CREATININE 0.70 0.59 0.63  --  0.67    Assessment: 80yo female now with lower heparin level despite increased rate last pm.  Goal of Therapy:  Heparin level 0.3-0.7 units/ml   Plan:  Will increase heparin gtt by 3 units/kg/hr to 850 units/hr and check level in [place], PharmD, BCPS  12/09/2013,4:04 AM

## 2013-12-09 NOTE — Anesthesia Preprocedure Evaluation (Addendum)
Anesthesia Evaluation  Patient identified by MRN, date of birth, ID band Patient awake    Reviewed: Allergy & Precautions, H&P , NPO status , Patient's Chart, lab work & pertinent test results  Airway Mallampati: I TM Distance: >3 FB Neck ROM: Full    Dental  (+) Teeth Intact, Dental Advisory Given   Pulmonary shortness of breath and with exertion,  States OSA is resolved since CABG surgeery and no longer uses CPAP         Cardiovascular Exercise Tolerance: Poor hypertension, Pt. on medications + CAD and +CHF + dysrhythmias + Valvular Problems/Murmurs AI and MR     Neuro/Psych    GI/Hepatic PUD, GERD-  Medicated and Controlled,  Endo/Other  Hypothyroidism   Renal/GU      Musculoskeletal   Abdominal   Peds  Hematology   Anesthesia Other Findings   Reproductive/Obstetrics                          Anesthesia Physical Anesthesia Plan  ASA: III  Anesthesia Plan: General   Post-op Pain Management:    Induction: Intravenous  Airway Management Planned: Oral ETT  Additional Equipment:   Intra-op Plan:   Post-operative Plan: Extubation in OR  Informed Consent: I have reviewed the patients History and Physical, chart, labs and discussed the procedure including the risks, benefits and alternatives for the proposed anesthesia with the patient or authorized representative who has indicated his/her understanding and acceptance.     Plan Discussed with: CRNA and Surgeon  Anesthesia Plan Comments:         Anesthesia Quick Evaluation

## 2013-12-09 NOTE — Preoperative (Signed)
Beta Blockers   Reason not to administer Beta Blockers:Not Applicable 

## 2013-12-09 NOTE — Progress Notes (Signed)
Good response to tolvaptan, will d/c.  Manage hyponatremia from here with salt tabs (start 2 gm bid, ^ to 3gm tid max) and water restriction, possibly lasix. Will follow.   Kelly Splinter MD (pgr) [phone]    [phone] 12/09/2013, 3:10 PM

## 2013-12-09 NOTE — Progress Notes (Signed)
[facility] PROGRESS NOTE  GURSIMRAN LITAKER AQT:[id number] DOB: 03-02-1924 DOA: 12/07/2013 PCP: Mathews Argyle, MD  Assessment/Plan: #1 valgus impacted right femoral neck fracture Secondary to mechanical fall. Patient has been seen by orthopedics and awaiting hyponatremia to improve prior to probable surgery this afternoon. Per orthopedics.  #2 hyponatremia May be secondary to SIADH as patient with high urine sodium and a high urine osmolality. SSRI has been discontinued. Diuretics on hold. Patient with no improvement in hyponatremia with normal saline. TSH within normal limits. Cortisol level pending. Chest x-ray negative. Sodium one as low as 119. Last sodium was 127 at approximately 2 AM this morning. Nephrology was consulted and patient was started on Samscar. Repeat basic metabolic profile. Per nephrology. Appreciate nephrology input and recommendations. Follow.  #3 anemia Likely anemia of chronic disease versus dilutional. IV fluids and a saline lock. No overt bleeding. Check anemia panel. Follow H&H.  #4. history of recent DVT On IV heparin. Orthopedics to advise when to resume xarelto.  #5 history of coronary artery disease status post CABG/history of chronic diastolic CHF Stable. Spironolactone on hold secondary to problem #2.  Xaralto on hold in anticipation of surgery. Follow.  #6 hypothyroidism Resume home dose Synthroid.  #7 urinary tract infection Urine cultures are pending. Continue IV Rocephin.  #8 prophylaxis On heparin for DVT prophylaxis.  Code Status: Full Family Communication: Updated patient and husband at bedside. Disposition Plan: Probable SNF versus CIR versus home when medically stable.   Consultants:  Orthopedics: Dr. Mayer Camel 12/08/2013  Nephrology: Dr. Jonnie Finner 12/08/2013  Procedures:  X-ray of the right hip 12/07/2013  Chest x-ray 12/07/2013  X-ray of the C-spine 12/07/2013  Antibiotics:  12/08/2013  HPI/Subjective: Patient  complaining of some occasional cramping on the left side of the chest. Patient also complaining of right hip pain when she moves.  Objective: Filed Vitals:   12/09/13 0642  BP: 126/44  Pulse: 74  Temp: 98.2 F (36.8 C)  Resp: 18    Intake/Output Summary (Last 24 hours) at 12/09/13 0935 Last data filed at 12/09/13 5625  Gross per 24 hour  Intake    480 ml  Output   1825 ml  Net  -1345 ml   Filed Weights   12/07/13 1857  Weight: 44.453 kg (98 lb)    Exam:   General:  nad  Cardiovascular: RRR  Respiratory: CTAB anterior lung fields  Abdomen: Soft/NT/ND/+BS  Musculoskeletal: Right hip pain. No c/c/e  Data Reviewed: Basic Metabolic Panel:  Recent Labs Lab 12/07/13 2028 12/08/13 0543 12/08/13 1405 12/08/13 2000 12/09/13 0158  NA 124* 125* 121* 119* 127*  K 4.5 4.8 4.6  --  4.8  CL 89* 92* 90*  --  94*  CO2 20 21 19   --  21  GLUCOSE 97 93 82  --  90  BUN 13 10 11   --  11  CREATININE 0.70 0.59 0.63  --  0.67  CALCIUM 9.1 8.9 8.4  --  8.8   Liver Function Tests: No results found for this basename: AST, ALT, ALKPHOS, BILITOT, PROT, ALBUMIN,  in the last 168 hours No results found for this basename: LIPASE, AMYLASE,  in the last 168 hours No results found for this basename: AMMONIA,  in the last 168 hours CBC:  Recent Labs Lab 12/07/13 2028 12/08/13 0543 12/09/13 0158  WBC 10.7* 9.2 8.9  NEUTROABS 8.6*  --   --   HGB 11.1* 11.1* 10.6*  HCT 32.0* 32.4* 31.7*  MCV 79.8 80.2 80.7  PLT 360 373 315   Cardiac Enzymes: No results found for this basename: CKTOTAL, CKMB, CKMBINDEX, TROPONINI,  in the last 168 hours BNP (last 3 results)  Recent Labs  09/05/13 0810 09/12/13 0011  PROBNP 3947.0* 1314.0*   CBG: No results found for this basename: GLUCAP,  in the last 168 hours  Recent Results (from the past 240 hour(s))  MRSA PCR SCREENING     Status: None   Collection Time    12/09/13  5:38 AM      Result Value Ref Range Status   MRSA by PCR  NEGATIVE  NEGATIVE Final   Comment:            The GeneXpert MRSA Assay (FDA     approved for NASAL specimens     only), is one component of a     comprehensive MRSA colonization     surveillance program. It is not     intended to diagnose MRSA     infection nor to guide or     monitor treatment for     MRSA infections.     Studies: Dg Chest 1 View  12/07/2013   CLINICAL DATA:  History of trauma from a fall.  EXAM: CHEST - 1 VIEW  COMPARISON:  Chest x-ray 11/29/2013.  FINDINGS: Lung volumes are normal. No consolidative airspace disease. No pleural effusions. No pneumothorax. No pulmonary nodule or mass noted. Pulmonary vasculature and the cardiomediastinal silhouette are within normal limits. Visualize bony thorax is grossly intact. Status post median sternotomy. Left atrial appendage ligation clip noted. Atherosclerosis in the thoracic aorta.  IMPRESSION: 1. No evidence of significant acute traumatic injury to the thorax on this plain film examination. 2. Atherosclerosis. 3. Postoperative changes, as above.   Electronically Signed   By: Vinnie Langton M.D.   On: 12/07/2013 19:53   Dg Cervical Spine Complete  12/07/2013   CLINICAL DATA:  Post fall, hit head  EXAM: CERVICAL SPINE  4+ VIEWS  COMPARISON:  None.  FINDINGS: C1 to the superior endplate of T1 is imaged on the provided supine cross-table lateral radiograph.  Normal alignment of the cervical spine. No definite anterolisthesis or retrolisthesis. The bilateral facets appear aligned given obliquity. The dens is normally positioned between the lateral masses of C1.  Cervical vertebral body heights are preserved. Prevertebral soft tissues are normal.  There is moderate to severe multilevel cervical spine a DDD, likely worse at C5-C6 and C6-C7 and to a lesser extent, C3-C4 and C4-C5 with disc space height loss, endplate irregularity and sclerosis.  Atherosclerotic plaque within the bilateral carotid bulbs.  Limited visualization of lung apices  demonstrates sequela of prior median sternotomy. Atherosclerotic plaque within the aortic arch.  IMPRESSION: 1. No definite acute findings. Further evaluation could be performed with cervical spine CT as clinically indicated. 2. Moderate to severe multilevel cervical spine DDD.   Electronically Signed   By: Sandi Mariscal M.D.   On: 12/07/2013 19:58   Dg Hip Complete Right  12/07/2013   CLINICAL DATA:  Post fall, now with right-sided hip pain  EXAM: RIGHT HIP - COMPLETE 2+ VIEW  COMPARISON:  None.  FINDINGS: There is a minimally displaced right-sided subcapital neck femur fracture with associated minimal foreshortening. No definite intra-articular extension. No definite associated pelvic fracture. Limited visualization of the contralateral left hip is normal. Mild scoliotic curvature of the lower lumbar spine, convex to the left with associated suspected mild to moderate multilevel DDD. Regional soft tissues appear normal.  IMPRESSION:  Minimally displaced right-sided subcapital femoral neck fracture.   Electronically Signed   By: Sandi Mariscal M.D.   On: 12/07/2013 19:55   Ct Head Wo Contrast  12/07/2013   CLINICAL DATA:  Post fall, hit back of head now with hematoma, no loss of consciousness.  EXAM: CT HEAD WITHOUT CONTRAST  TECHNIQUE: Contiguous axial images were obtained from the base of the skull through the vertex without intravenous contrast.  COMPARISON:  None.  FINDINGS: There is mild likely age-appropriate atrophy with mild diffuse sulcal prominence. Scattered periventricular hypodensities compatible with microvascular ischemic disease. Given background parenchymal abnormalities, there is no CT evidence of acute superimposed large territory infarct. No definite intraparenchymal or extra-axial mass or hemorrhage. Normal size and configuration of the ventricles and basilar cisterns. No midline shift. Limited visualization of the paranasal sinuses and mastoid air cells are normal.  Regional soft tissues are  normal with special attention paid to the occiput. Post bilateral cataract surgery. No displaced calvarial fracture.  IMPRESSION: Mild likely age-appropriate atrophy and microvascular ischemic disease without acute intracranial process.   Electronically Signed   By: Sandi Mariscal M.D.   On: 12/07/2013 19:31    Scheduled Meds: . cefTRIAXone (ROCEPHIN)  IV  1 g Intravenous Q24H  . tolvaptan  15 mg Oral Q24H   Continuous Infusions: . heparin 850 Units/hr (12/09/13 0407)    Principal Problem:   Hip fracture, right Active Problems:   Essential hypertension, benign   Paroxysmal atrial fibrillation   Chronic diastolic heart failure   Coronary atherosclerosis of native coronary artery   DVT (deep venous thrombosis)   Anticoagulation goal of INR 2.5 to 3.5   UTI (lower urinary tract infection)   Hyponatremia   Fall    Time spent: [facility] Pager [phone]. If 7PM-7AM, please contact night-coverage at [email or web address], password [facility] 12/09/2013, 9:35 AM  LOS: 2 days

## 2013-12-09 NOTE — Progress Notes (Signed)
Subjective: Patient seen and examined this morning, she did get some sleep last night. Her sodium bottomed out at 119 yesterday afternoon and she received Claybon Jabs a from nephrology and her sodium at 2:00 this morning was 127 another sodium will be accomplished at 8:00. She reports continued pain in her left hip that is severe she tries to move or sit up at all. In addition I have reviewed her chart and she has had some balance issues over the last 6-8 months..   Objective: Vital signs in last 24 hours: Temp:  [98.2 F (36.8 C)-99.1 F (37.3 C)] 98.2 F (36.8 C) (02/15 0642) Pulse Rate:  [74-80] 74 (02/15 0642) Resp:  [16-18] 18 (02/15 0642) BP: (109-127)/(44-47) 126/44 mmHg (02/15 0642) SpO2:  [91 %-99 %] 99 % (02/15 0642)  Intake/Output from previous day: 02/14 0701 - 02/15 0700 In: 480 [P.O.:480] Out: 1825 [Urine:1825] Intake/Output this shift:     Recent Labs  12/07/13 2028 12/08/13 0543 12/09/13 0158  HGB 11.1* 11.1* 10.6*    Recent Labs  12/08/13 0543 12/09/13 0158  WBC 9.2 8.9  RBC 4.04 3.93  HCT 32.4* 31.7*  PLT 373 315    Recent Labs  12/08/13 1405 12/08/13 2000 12/09/13 0158  NA 121* 119* 127*  K 4.6  --  4.8  CL 90*  --  94*  CO2 19  --  21  BUN 11  --  11  CREATININE 0.63  --  0.67  GLUCOSE 82  --  90  CALCIUM 8.4  --  8.8    Recent Labs  12/07/13 2028  INR 1.42   Exam: The skin over her left hip is intact she moves her foot up and down without difficulty and her calf is relatively soft.  Assessment/Plan: Assessment: valgus impacted rightt femoral neck fracture in an 89, with balance issues, osteoporosis, 3-1/2 months status post coronary artery bypass procedure, and currently being treated for right lower trembly DVT. She is on IV heparin for DVT at this time with a PTT of 45.  Plan: Once again the situation was discussed with the patient and her husband who is an internal medicine physician. Mobilization when she is medically safe is a  very high priority. Her sodium will be rechecked shortly and if it has increased again above 127 we will stop her IV heparin, obtain formal consent for hemiarthroplasty and proceed with surgery today as that is their wish and probably carries the lowest overall risk.   Arin Peral J 12/09/2013, 7:52 AM

## 2013-12-10 ENCOUNTER — Ambulatory Visit (HOSPITAL_COMMUNITY): Payer: Medicare Other

## 2013-12-10 ENCOUNTER — Other Ambulatory Visit: Payer: Self-pay

## 2013-12-10 DIAGNOSIS — S72009A Fracture of unspecified part of neck of unspecified femur, initial encounter for closed fracture: Secondary | ICD-10-CM

## 2013-12-10 LAB — CBC
HCT: 30.3 % — ABNORMAL LOW (ref 36.0–46.0)
HEMOGLOBIN: 10 g/dL — AB (ref 12.0–15.0)
MCH: 27 pg (ref 26.0–34.0)
MCHC: 33 g/dL (ref 30.0–36.0)
MCV: 81.9 fL (ref 78.0–100.0)
PLATELETS: 315 10*3/uL (ref 150–400)
RBC: 3.7 MIL/uL — ABNORMAL LOW (ref 3.87–5.11)
RDW: 16.5 % — ABNORMAL HIGH (ref 11.5–15.5)
WBC: 11.9 10*3/uL — AB (ref 4.0–10.5)

## 2013-12-10 LAB — BASIC METABOLIC PANEL
BUN: 7 mg/dL (ref 6–23)
CALCIUM: 8.3 mg/dL — AB (ref 8.4–10.5)
CO2: 21 mEq/L (ref 19–32)
Chloride: 97 mEq/L (ref 96–112)
Creatinine, Ser: 0.57 mg/dL (ref 0.50–1.10)
GFR calc Af Amer: >90 mL/min (ref >90)
GFR, EST NON AFRICAN AMERICAN: 81 mL/min — AB (ref >90)
GLUCOSE: 108 mg/dL — AB (ref 70–99)
POTASSIUM: 4.3 meq/L (ref 3.7–5.3)
Sodium: 129 mEq/L — ABNORMAL LOW (ref 137–147)

## 2013-12-10 LAB — TSH: TSH: 2.175 u[IU]/mL (ref 0.350–4.500)

## 2013-12-10 LAB — GLUCOSE, CAPILLARY: GLUCOSE-CAPILLARY: 122 mg/dL — AB (ref 70–99)

## 2013-12-10 MED ORDER — TIZANIDINE HCL 2 MG PO TABS
2.0000 mg | ORAL_TABLET | Freq: Three times a day (TID) | ORAL | Status: DC
  Administered 2013-12-10 – 2013-12-12 (×8): 2 mg via ORAL
  Filled 2013-12-10 (×9): qty 1

## 2013-12-10 MED ORDER — FUROSEMIDE 20 MG PO TABS
20.0000 mg | ORAL_TABLET | Freq: Every day | ORAL | Status: DC
  Administered 2013-12-10 – 2013-12-12 (×3): 20 mg via ORAL
  Filled 2013-12-10 (×3): qty 1

## 2013-12-10 NOTE — Progress Notes (Signed)
[facility] PROGRESS NOTE  Maria [name] PRF:[id number] DOB: 06-Nov-1923 DOA: 12/07/2013 PCP: Mathews Argyle, MD  Assessment/Plan: #1 valgus impacted right femoral neck fracture Secondary to mechanical fall. Patient has been seen by orthopedics and s/p right hemi arthroplasty per Dr Mayer Camel 12/09/13. PT/IOT eval pending. Patient on xarelto. Will likely need SNF. Per orthopedics.  #2 hyponatremia May be secondary to SIADH as patient with high urine sodium and a high urine osmolality. SSRI has been discontinued. Diuretics on hold. Patient with no improvement in hyponatremia with normal saline. TSH within normal limits. Cortisol level at 7.5. Chest x-ray negative. Sodium was as low as 119. Last sodium was 133 at 741pm last night.  Nephrology was consulted and patient was started on Samscar. Repeat basic metabolic profile pending. Per nephrology will d/c tolvaptan and continue salt tablets, fluid restriction at 1200 cc/day. ?? Resume spironolactone at 12.44m daily per cardiology, will defer to renal.  Follow.  #3 anemia Likely anemia of chronic disease versus dilutional. IV fluids and a saline lock. No overt bleeding. Anemia panel and CBC pending. Follow H&H.  #4. history of recent DVT Xarelto resumed.  #5 history of coronary artery disease status post CABG/history of chronic diastolic CHF Stable. Spironolactone on hold secondary to problem #2.  Xarelto resumed. Follow.  #6 hypothyroidism Continue Synthroid.  #7 urinary tract infection Urine cultures are pending. Continue IV Rocephin.  #8 prophylaxis On xarelto for DVT prophylaxis.  Code Status: Full Family Communication: Updated patient and husband at bedside. Disposition Plan: Probable SNF when medically stable.   Consultants:  Orthopedics: Dr. RMayer Camel02/14/2015  Nephrology: Dr. SJonnie Finner02/14/2015  Procedures:  X-ray of the right hip 12/07/2013  Chest x-ray 12/07/2013  X-ray of the C-spine 12/07/2013  R  hemi-hip arthroplasty per Dr RMayer Camel2/15/15  Antibiotics:  IV Rocephin 12/08/2013  HPI/Subjective: Patient complaining of nausea and emesis after ingesting salt tablets this morning.  Objective: Filed Vitals:   12/10/13 0516  BP: 138/50  Pulse: 85  Temp: 99.2 F (37.3 C)  Resp: 18    Intake/Output Summary (Last 24 hours) at 12/10/13 0940 Last data filed at 12/10/13 0517  Gross per 24 hour  Intake   1080 ml  Output    895 ml  Net    185 ml   Filed Weights   12/07/13 1857  Weight: 44.453 kg (98 lb)    Exam:   General:  nad  Cardiovascular: RRR  Respiratory: CTAB anterior lung fields  Abdomen: Soft/NT/ND/+BS  Musculoskeletal: Right hip pain. No c/c/e  Data Reviewed: Basic Metabolic Panel:  Recent Labs Lab 12/07/13 2028 12/08/13 0543 12/08/13 1405 12/08/13 2000 12/09/13 0158 12/09/13 0900 12/09/13 1132 12/09/13 1941  NA 124* 125* 121* 119* 127* 131* 130* 133*  K 4.5 4.8 4.6  --  4.8  --  4.5  --   CL 89* 92* 90*  --  94*  --  97  --   CO2 20 21 19   --  21  --  21  --   GLUCOSE 97 93 82  --  90  --  83  --   BUN 13 10 11   --  11  --  9  --   CREATININE 0.70 0.59 0.63  --  0.67  --  0.59  --   CALCIUM 9.1 8.9 8.4  --  8.8  --  8.9  --    Liver Function Tests: No results found for this basename: AST, ALT, ALKPHOS, BILITOT, PROT, ALBUMIN,  in the  last 168 hours No results found for this basename: LIPASE, AMYLASE,  in the last 168 hours No results found for this basename: AMMONIA,  in the last 168 hours CBC:  Recent Labs Lab 12/07/13 2028 12/08/13 0543 12/09/13 0158  WBC 10.7* 9.2 8.9  NEUTROABS 8.6*  --   --   HGB 11.1* 11.1* 10.6*  HCT 32.0* 32.4* 31.7*  MCV 79.8 80.2 80.7  PLT 360 373 315   Cardiac Enzymes: No results found for this basename: CKTOTAL, CKMB, CKMBINDEX, TROPONINI,  in the last 168 hours BNP (last 3 results)  Recent Labs  09/05/13 0810 09/12/13 0011  PROBNP 3947.0* 1314.0*   CBG: No results found for this basename:  GLUCAP,  in the last 168 hours  Recent Results (from the past 240 hour(s))  URINE CULTURE     Status: None   Collection Time    12/07/13  8:55 PM      Result Value Ref Range Status   Specimen Description URINE, CATHETERIZED   Final   Special Requests NONE   Final   Culture  Setup Time     Final   Value: 12/08/2013 17:48     Performed at [facility] PENDING   Incomplete   Culture     Final   Value: Culture reincubated for better growth     Performed at [facility]   Report Status PENDING   Incomplete  MRSA PCR SCREENING     Status: None   Collection Time    12/09/13  5:38 AM      Result Value Ref Range Status   MRSA by PCR NEGATIVE  NEGATIVE Final   Comment:            The GeneXpert MRSA Assay (FDA     approved for NASAL specimens     only), is one component of a     comprehensive MRSA colonization     surveillance program. It is not     intended to diagnose MRSA     infection nor to guide or     monitor treatment for     MRSA infections.     Studies: Dg Pelvis Portable  12/09/2013   CLINICAL DATA:  Right hip replacement.  EXAM: PORTABLE PELVIS 1-2 VIEWS  COMPARISON:  Plain films right hip 12/07/2013.  FINDINGS: The patient has a new bipolar right hip hemiarthroplasty. Gas in the soft tissues from surgery noted. The device is located and there is no fracture.  IMPRESSION: Right hip replacement without evidence of complication.   Electronically Signed   By: Inge Rise M.D.   On: 12/09/2013 19:13    Scheduled Meds: . cefTRIAXone (ROCEPHIN)  IV  1 g Intravenous Q24H  . levothyroxine  50 mcg Oral QAC breakfast  . Rivaroxaban  20 mg Oral Q supper  . sodium chloride  2 g Oral TID WC  . tiZANidine  2 mg Oral TID  . tolvaptan  15 mg Oral Q24H   Continuous Infusions:    Principal Problem:   Hip fracture, right Active Problems:   Essential hypertension, benign   Paroxysmal atrial fibrillation   Chronic diastolic heart failure    Coronary atherosclerosis of native coronary artery   DVT (deep venous thrombosis)   Anticoagulation goal of INR 2.5 to 3.5   Closed right hip fracture   UTI (lower urinary tract infection)   Hyponatremia   Fall    Time spent: 35 MINS  [facility] MD [facility] Pager [phone]. If 7PM-7AM, please contact night-coverage at [email or web address], password [facility] 12/10/2013, 9:40 AM  LOS: 3 days

## 2013-12-10 NOTE — Evaluation (Signed)
Occupational Therapy Evaluation Patient Details Name: Maria [name] MRN: [id number] DOB: 02-Jul-1924 Today's Date: 12/10/2013 Time: 1594-5859 OT Time Calculation (min): 29 min  OT Assessment / Plan / Recommendation History of present illness Admitted with fall resulting in supcapital R hip fx; now s/p THA posterior prec WBAt   Clinical Impression   This 80 yo female admitted and underwent above presents to acute OT with decreased AROM RLE, increased pain RLE, lethargy, incontinence, decreased overall strength, decreased knowledge of posterior precautions, decreased mobility all affecting pt's ability to take care of herself. Will benefit from acute OT with follow up on CIR.     OT Assessment  Patient needs continued OT Services    Follow Up Recommendations  CIR       Equipment Recommendations   (TBD next venue)       Frequency  Min 2X/week    Precautions / Restrictions Precautions Precautions: Posterior Hip;Fall Precaution Booklet Issued: Yes (comment) Precaution Comments: Posterior precautions posted in room; Pt and husband educated -- will need reinforcement Restrictions Weight Bearing Restrictions: No RLE Weight Bearing: Weight bearing as tolerated   Pertinent Vitals/Pain Right hip with movement; repositioned    ADL  Eating/Feeding: Set up;Supervision/safety Where Assessed - Eating/Feeding: Chair Grooming: Set up;Supervision/safety Where Assessed - Grooming: Supported sitting Upper Body Bathing: Supervision/safety;Set up Where Assessed - Upper Body Bathing: Supported sitting Lower Body Bathing: +1 Total assistance Where Assessed - Lower Body Bathing: Supported sit to stand Upper Body Dressing: Maximal assistance Where Assessed - Upper Body Dressing: Supported sitting Lower Body Dressing: +1 Total assistance Where Assessed - Lower Body Dressing: Supported sit to Lobbyist: +2 Total assistance Toilet Transfer: Patient Percentage: 50% Armed forces technical officer  Method: Arts development officer:  (Recliner>bed going to pt's left) Toileting - Clothing Manipulation and Hygiene: +1 Total assistance (pt incontinent of urine as she stood up from recliner to go to bed) Equipment Used: Gait belt;Rolling walker Transfers/Ambulation Related to ADLs: total A +2 Mod    OT Diagnosis: Generalized weakness;Cognitive deficits;Acute pain  OT Problem List: Decreased strength;Decreased range of motion;Decreased activity tolerance;Impaired balance (sitting and/or standing);Pain;Decreased cognition;Decreased knowledge of use of DME or AE OT Treatment Interventions: Self-care/ADL training;Patient/family education;Balance training;Therapeutic activities;DME and/or AE instruction;Cognitive remediation/compensation   OT Goals(Current goals can be found in the care plan section) Acute Rehab OT Goals Patient Stated Goal: go back to [facility] OT Goal Formulation: With patient Time For Goal Achievement: 12/24/13 Potential to Achieve Goals: Good  Visit Information  Last OT Received On: 12/10/13 Assistance Needed: +2 History of Present Illness: Admitted with fall resulting in supcapital R hip fx; now s/p THA posterior prec WBAt       Prior [place] expects to be discharged to:: Inpatient rehab Living Arrangements: Spouse/significant other Prior Function Level of Independence: Independent Communication Communication: No difficulties Dominant Hand: Right         Vision/Perception Vision - History Patient Visual Report: No change from baseline   Cognition  Cognition Arousal/Alertness: Lethargic Behavior During Therapy: WFL for tasks assessed/performed Overall Cognitive Status: Impaired/Different from baseline ("are you getting ready to use the microwave in here"--I had told her that I was untangling her O2 tubing)    Extremity/Trunk Assessment Upper Extremity Assessment Upper Extremity Assessment: Overall  WFL for tasks assessed     Mobility Bed Mobility Overal bed mobility: +2 for physical assistance;Needs Assistance Bed Mobility: Sit to Supine Sit to supine: Max assist;+2 for physical assistance Transfers  Overall transfer level: Needs assistance Equipment used: Rolling walker (2 wheeled) Transfers: Sit to/from Stand Sit to Stand: +2 physical assistance;Min assist General transfer comment: Cues for technqiue and post hip prec; Overall quite good rise on LLE; did not tolerate much weight at all on RLE        Balance Balance Overall balance assessment: Needs assistance Sitting-balance support: Bilateral upper extremity supported;Feet supported Sitting balance-Leahy Scale: Zero Sitting balance - Comments: has a hard time coming forwar for sit>stand Postural control: Posterior lean Standing balance support: Bilateral upper extremity supported Standing balance-Leahy Scale: Zero   End of Session OT - End of Session Equipment Utilized During Treatment: Gait belt;Rolling walker Activity Tolerance: Patient limited by fatigue;Patient limited by pain Patient left: in bed;with call bell/phone within reach;with bed alarm set Nurse Communication:  (pt urinated)       Almon Register [phone] 12/10/2013, 1:49 PM

## 2013-12-10 NOTE — Evaluation (Signed)
Physical Therapy Evaluation Patient Details Name: Maria [name] MRN: [id number] DOB: Oct 16, 1924 Today's Date: 12/10/2013 Time: 1287-8676 PT Time Calculation (min): 43 min  PT Assessment / Plan / Recommendation History of Present Illness  Admitted with fall resulting in supcapital R hip fx; now s/p THA posterior prec WBAt  Clinical Impression  Patient is s/p above surgery resulting in functional limitations due to the deficits listed below (see PT Problem List).  Patient will benefit from skilled PT to increase their independence and safety with mobility to allow discharge to the venue listed below.       PT Assessment  Patient needs continued PT services    Follow Up Recommendations  CIR    Does the patient have the potential to tolerate intense rehabilitation      Barriers to Discharge        Equipment Recommendations  Rolling walker with 5" wheels;3in1 (PT)    Recommendations for Other Services OT consult;Rehab consult   Frequency 7X/week    Precautions / Restrictions Precautions Precautions: Posterior Hip;Fall Precaution Booklet Issued: Yes (comment) Precaution Comments: Posterior precautions posted in room; Pt and husband educated -- will need reinforcement Restrictions Weight Bearing Restrictions: Yes RLE Weight Bearing: Weight bearing as tolerated   Pertinent Vitals/Pain Extreme pain with any motion of RLE patient repositioned for comfort Pt also reprted shortness of breath and dizziness with mobilizing; restarted supplemental O2 BP 106/49 in recliner post amb; RN made aware       Mobility  Bed Mobility Overal bed mobility: Needs Assistance;+2 for physical assistance Bed Mobility: Supine to Sit Supine to sit: +2 for physical assistance;Total assist General bed mobility comments: Required phsyical assistance for all aspects of bed mobility; step-by-step cues for technique Transfers Overall transfer level: Needs assistance Equipment used: Rolling walker  (2 wheeled) Transfers: Sit to/from Stand Sit to Stand: +2 safety/equipment;Min assist General transfer comment: Cues for technqiue and post hip prec; Overall quite good rise on LLE; did not tolerate much weight at all on RLE Ambulation/Gait Ambulation/Gait assistance: +2 physical assistance;Mod assist Ambulation Distance (Feet): 3 Feet Assistive device: Rolling walker (2 wheeled) Gait Pattern/deviations: Step-to pattern General Gait Details: Cues for gait sequence and technique; phsyical assist to advance RLE; Pt essentially TDWB RLE    Exercises Total Joint Exercises Ankle Circles/Pumps: AROM;Both;5 reps Quad Sets: AROM;Right;5 reps Heel Slides: AAROM;Right (2)   PT Diagnosis: Difficulty walking;Acute pain  PT Problem List: Decreased strength;Decreased range of motion;Decreased activity tolerance;Decreased balance;Decreased mobility;Decreased coordination;Decreased knowledge of use of DME;Pain;Decreased knowledge of precautions PT Treatment Interventions: DME instruction;Gait training;Stair training;Functional mobility training;Therapeutic activities;Therapeutic exercise;Balance training;Patient/family education     PT Goals(Current goals can be found in the care plan section) Acute Rehab PT Goals Patient Stated Goal: did not state PT Goal Formulation: With patient Time For Goal Achievement: 12/17/13 Potential to Achieve Goals: Good  Visit Information  Last PT Received On: 12/10/13 Assistance Needed: +2 History of Present Illness: Admitted with fall resulting in supcapital R hip fx; now s/p THA posterior prec WBAt       Prior Functioning  Home Living Family/patient expects to be discharged to:: Inpatient rehab Living Arrangements: Spouse/significant other Prior Function Level of Independence: Independent Communication Communication: No difficulties    Cognition  Cognition Arousal/Alertness: Awake/alert Behavior During Therapy: WFL for tasks assessed/performed Overall  Cognitive Status: Within Functional Limits for tasks assessed    Extremity/Trunk Assessment Upper Extremity Assessment Upper Extremity Assessment: Defer to OT evaluation Lower Extremity Assessment Lower Extremity Assessment: RLE deficits/detail RLE Deficits /  Details: Overall decr AROM and strength/muscle actviation limited by pain postop RLE: Unable to fully assess due to pain   Balance    End of Session PT - End of Session Equipment Utilized During Treatment: Oxygen Activity Tolerance: Patient limited by pain;Other (comment) (limited by anxiety at moving) Patient left: in chair;with call bell/phone within reach;with family/visitor present Nurse Communication: Mobility status  GP     Maria [name] [name] [place], [place]  12/10/2013, 11:42 AM

## 2013-12-10 NOTE — Clinical Documentation Improvement (Signed)
"  underweight" and "severe malnutrition" was documented by the nutritionist on 2/14. If your clinical findings/judgment agrees with their diagnosis, could you please document the related diagnosis in the progress note(s) and discharge summary. [employer] YOU!    BEST PRACTICE: A diagnosis of UNDERWEIGHT or MORBID OBESITY should have the BMI documented along with it.  Possible Clinical Conditions?  - Cachetic  - Cachetic with Severd Malnutrition  - Underweight   - Underweight with Severe Malnutrition  - Other condition (please document in the progress notes and/or discharge summary)  Supporting Information: Estimated body mass index is 18.22 kg/(m^2) as calculated from the following:   Height as of this encounter: 5' 1.5" (1.562 m).   Weight as of this encounter: 98 lb (44.453 kg).   Thank You, Hartley Barefoot ,RN Clinical Documentation Specialist:  [facility] [employer]

## 2013-12-10 NOTE — Progress Notes (Signed)
Patient ID: KAYLER BUCKHOLTZ, female   DOB: 04/02/1924, 80 y.o.   MRN: [id number] PATIENT ID: LUWANA BUTRICK  MRN: [id number]  DOB/AGE:  December 20, 1923 / 80 y.o.  1 Day Post-Op Procedure(s) (LRB): ARTHROPLASTY BIPOLAR HIP CEMENTED (Right)    PROGRESS NOTE Subjective: Patient is alert, oriented,no Nausea, no Vomiting, yes passing gas, no Bowel Movement. Taking PO sips. Denies SOB, Chest or Calf Pain. Using Incentive Spirometer, PAS in place. Ambulate WBAT with PT Patient reports pain as 5 on 0-10 scale  .    Objective: Vital signs in last 24 hours: Filed Vitals:   12/09/13 1807 12/09/13 2043 12/10/13 0216 12/10/13 0516  BP: 142/50 128/48 112/44 138/50  Pulse: 80 84 79 85  Temp: 97.8 F (36.6 C) 97.9 F (36.6 C) 98.4 F (36.9 C) 99.2 F (37.3 C)  TempSrc:  Oral Oral Oral  Resp: 16 16 16 18   Height:      Weight:      SpO2: 99% 98% 95% 95%      Intake/Output from previous day: I/O last 3 completed shifts: In: 1380 [P.O.:480; I.V.:900] Out: 2570 [Urine:2370; Blood:200]   Intake/Output this shift: Total I/O In: 180 [P.O.:180] Out: 150 [Urine:150]   LABORATORY DATA:  Recent Labs  12/07/13 2028 12/08/13 0543  12/09/13 0158  12/09/13 1132 12/09/13 1941  WBC 10.7* 9.2  --  8.9  --   --   --   HGB 11.1* 11.1*  --  10.6*  --   --   --   HCT 32.0* 32.4*  --  31.7*  --   --   --   PLT 360 373  --  315  --   --   --   NA 124* 125*  < > 127*  < > 130* 133*  K 4.5 4.8  < > 4.8  --  4.5  --   CL 89* 92*  < > 94*  --  97  --   CO2 20 21  < > 21  --  21  --   BUN 13 10  < > 11  --  9  --   CREATININE 0.70 0.59  < > 0.67  --  0.59  --   GLUCOSE 97 93  < > 90  --  83  --   INR 1.42  --   --   --   --   --   --   CALCIUM 9.1 8.9  < > 8.8  --  8.9  --   < > = values in this interval not displayed.  Examination: Neurologically intact ABD soft Neurovascular intact Sensation intact distally Intact pulses distally Dorsiflexion/Plantar flexion intact Incision: no drainage No  cellulitis present Compartment soft} XR AP&Lat of hip shows well placed\fixed HHA  Assessment:   1 Day Post-Op Procedure(s) (LRB): ARTHROPLASTY BIPOLAR HIP CEMENTED (Right) ADDITIONAL DIAGNOSIS:  Hypertension  Plan: PT/OT WBAT, THA  posterior precautions  DVT Prophylaxis: SCDx72 hrs, ASA 325 mg BID x 2 weeks  DISCHARGE PLAN: Skilled Nursing Facility/Rehab, WBAT, cont Xarelto from pre-op, will need fu in 2 weeks for sutures, Oxy IR for pain, Zanaflex for muscle relaxer 62m  DISCHARGE NEEDS: HHPT, HHRN, CPM, Walker and 3-in-1 comode seat

## 2013-12-10 NOTE — Progress Notes (Signed)
Patient was screened by Gerlean Ren for appropriateness for an Inpatient Acute Rehab consult.  At this time, we are recommending Inpatient Rehab consult.  Please order when you feel appropriate.   [place] Admissions Coordinator Cell [phone] Office [phone]

## 2013-12-10 NOTE — Progress Notes (Signed)
Patient ID: Maria [name], female   DOB: 05/27/1924, 80 y.o.   MRN: [id number] S:feels weak and had some N/V this am O:BP 90/42  Pulse 74  Temp(Src) 99.5 F (37.5 C) (Oral)  Resp 18  Ht 5' 1.5" (1.562 m)  Wt 44.453 kg (98 lb)  BMI 18.22 kg/m2  SpO2 98%  Intake/Output Summary (Last 24 hours) at 12/10/13 1337 Last data filed at 12/10/13 1300  Gross per 24 hour  Intake   1325 ml  Output    395 ml  Net    930 ml   Intake/Output: I/O last 3 completed shifts: In: 1320 [P.O.:420; I.V.:900] Out: 2395 [Urine:2195; Blood:200]  Intake/Output this shift:  Total I/O In: 245 [P.O.:245] Out: -  Weight change:  BZM:CEYEM, elderly WF in NAD CVS:no rub Resp:cta VVK:PQAESL Ext:no edema   Recent Labs Lab 12/07/13 2028 12/08/13 0543 12/08/13 1405 12/08/13 2000 12/09/13 0158 12/09/13 0900 12/09/13 1132 12/09/13 1941 12/10/13 1015  NA 124* 125* 121* 119* 127* 131* 130* 133* 129*  K 4.5 4.8 4.6  --  4.8  --  4.5  --  4.3  CL 89* 92* 90*  --  94*  --  97  --  97  CO2 20 21 19   --  21  --  21  --  21  GLUCOSE 97 93 82  --  90  --  83  --  108*  BUN 13 10 11   --  11  --  9  --  7  CREATININE 0.70 0.59 0.63  --  0.67  --  0.59  --  0.57  CALCIUM 9.1 8.9 8.4  --  8.8  --  8.9  --  8.3*   Liver Function Tests: No results found for this basename: AST, ALT, ALKPHOS, BILITOT, PROT, ALBUMIN,  in the last 168 hours No results found for this basename: LIPASE, AMYLASE,  in the last 168 hours No results found for this basename: AMMONIA,  in the last 168 hours CBC:  Recent Labs Lab 12/07/13 2028 12/08/13 0543 12/09/13 0158 12/10/13 1015  WBC 10.7* 9.2 8.9 11.9*  NEUTROABS 8.6*  --   --   --   HGB 11.1* 11.1* 10.6* 10.0*  HCT 32.0* 32.4* 31.7* 30.3*  MCV 79.8 80.2 80.7 81.9  PLT 360 373 315 315   Cardiac Enzymes: No results found for this basename: CKTOTAL, CKMB, CKMBINDEX, TROPONINI,  in the last 168 hours CBG: No results found for this basename: GLUCAP,  in the last 168  hours  Iron Studies: No results found for this basename: IRON, TIBC, TRANSFERRIN, FERRITIN,  in the last 72 hours Studies/Results: Dg Pelvis Portable  12/09/2013   CLINICAL DATA:  Right hip replacement.  EXAM: PORTABLE PELVIS 1-2 VIEWS  COMPARISON:  Plain films right hip 12/07/2013.  FINDINGS: The patient has a new bipolar right hip hemiarthroplasty. Gas in the soft tissues from surgery noted. The device is located and there is no fracture.  IMPRESSION: Right hip replacement without evidence of complication.   Electronically Signed   By: Inge Rise M.D.   On: 12/09/2013 19:13   . cefTRIAXone (ROCEPHIN)  IV  1 g Intravenous Q24H  . levothyroxine  50 mcg Oral QAC breakfast  . Rivaroxaban  20 mg Oral Q supper  . sodium chloride  2 g Oral TID WC  . tiZANidine  2 mg Oral TID    BMET    Component Value Date/Time   NA 129* 12/10/2013 1015  K 4.3 12/10/2013 1015   CL 97 12/10/2013 1015   CO2 21 12/10/2013 1015   GLUCOSE 108* 12/10/2013 1015   BUN 7 12/10/2013 1015   CREATININE 0.57 12/10/2013 1015   CALCIUM 8.3* 12/10/2013 1015   GFRNONAA 81* 12/10/2013 1015   GFRAA >90 12/10/2013 1015   CBC    Component Value Date/Time   WBC 11.9* 12/10/2013 1015   RBC 3.70* 12/10/2013 1015   HGB 10.0* 12/10/2013 1015   HCT 30.3* 12/10/2013 1015   PLT 315 12/10/2013 1015   MCV 81.9 12/10/2013 1015   MCH 27.0 12/10/2013 1015   MCHC 33.0 12/10/2013 1015   RDW 16.5* 12/10/2013 1015   LYMPHSABS 1.5 12/07/2013 2028   MONOABS 0.5 12/07/2013 2028   EOSABS 0.0 12/07/2013 2028   BASOSABS 0.0 12/07/2013 2028    Assessment/Plan:  1. Hyponatremia- likely SIADH (possibly due to SSRI) but also on DDx would be reset osmostat and/or adrenal insufficiency (low cortisol of 7.5).  Sodium had improved with samsca and now drop in Na with salt tabs and free water restriction 1. will add lasix 24m po daily and follow but may need to restart samsca 2. Recheck am cortisol level as she may benefit from solucortef 2. R hip fx- s/p  THA on 12/09/13 3. CHF (diastolic)- euvolemic 4. HTN- low BP at this time, need to r/o adrenal insufficiency as above 5. CAD s/p CABG 11/14 6. LLE DVT 12/14 7. Hypothyroidism- TSH pending 8. ABLA- follow h/h 9. DImbodenA

## 2013-12-10 NOTE — Care Management Note (Signed)
CARE MANAGEMENT NOTE 12/10/2013  Patient:  Maria [name], Maria [name]   Account Number:  [id number]  Date Initiated:  12/10/2013  Documentation initiated by:  Ricki Miller  Subjective/Objective Assessment:   80 yr old female s/p right hip hemiarthroplasty.     Action/Plan:   Physical therapy and Occupational therapy are reccommending CIR.  CM will follow   Anticipated DC Date:  12/11/2013   Anticipated DC Plan:  IP REHAB FACILITY      DC Planning Services  CM consult      Choice offered to / List presented to:             Status of service:  In process, will continue to follow

## 2013-12-11 ENCOUNTER — Encounter (HOSPITAL_COMMUNITY): Payer: Self-pay | Admitting: Internal Medicine

## 2013-12-11 DIAGNOSIS — N312 Flaccid neuropathic bladder, not elsewhere classified: Secondary | ICD-10-CM

## 2013-12-11 DIAGNOSIS — S72009A Fracture of unspecified part of neck of unspecified femur, initial encounter for closed fracture: Secondary | ICD-10-CM

## 2013-12-11 HISTORY — DX: Flaccid neuropathic bladder, not elsewhere classified: N31.2

## 2013-12-11 LAB — CBC
HCT: 27.7 % — ABNORMAL LOW (ref 36.0–46.0)
HEMOGLOBIN: 9 g/dL — AB (ref 12.0–15.0)
MCH: 26.8 pg (ref 26.0–34.0)
MCHC: 32.5 g/dL (ref 30.0–36.0)
MCV: 82.4 fL (ref 78.0–100.0)
PLATELETS: 290 10*3/uL (ref 150–400)
RBC: 3.36 MIL/uL — AB (ref 3.87–5.11)
RDW: 16.6 % — ABNORMAL HIGH (ref 11.5–15.5)
WBC: 12.4 10*3/uL — AB (ref 4.0–10.5)

## 2013-12-11 LAB — CORTISOL: Cortisol, Plasma: 19.9 ug/dL

## 2013-12-11 LAB — URINE CULTURE

## 2013-12-11 LAB — URINALYSIS, ROUTINE W REFLEX MICROSCOPIC
Bilirubin Urine: NEGATIVE
GLUCOSE, UA: NEGATIVE mg/dL
Ketones, ur: NEGATIVE mg/dL
LEUKOCYTES UA: NEGATIVE
Nitrite: NEGATIVE
PH: 5.5 (ref 5.0–8.0)
Protein, ur: NEGATIVE mg/dL
Specific Gravity, Urine: 1.018 (ref 1.005–1.030)
Urobilinogen, UA: 0.2 mg/dL (ref 0.0–1.0)

## 2013-12-11 LAB — BASIC METABOLIC PANEL
BUN: 11 mg/dL (ref 6–23)
CHLORIDE: 95 meq/L — AB (ref 96–112)
CO2: 21 meq/L (ref 19–32)
Calcium: 8.3 mg/dL — ABNORMAL LOW (ref 8.4–10.5)
Creatinine, Ser: 0.65 mg/dL (ref 0.50–1.10)
GFR calc Af Amer: 90 mL/min — ABNORMAL LOW (ref >90)
GFR calc non Af Amer: 78 mL/min — ABNORMAL LOW (ref >90)
GLUCOSE: 103 mg/dL — AB (ref 70–99)
POTASSIUM: 4.1 meq/L (ref 3.7–5.3)
SODIUM: 129 meq/L — AB (ref 137–147)

## 2013-12-11 LAB — URINE MICROSCOPIC-ADD ON

## 2013-12-11 MED ORDER — DOCUSATE SODIUM 100 MG PO CAPS
100.0000 mg | ORAL_CAPSULE | Freq: Two times a day (BID) | ORAL | Status: DC
  Administered 2013-12-11 – 2013-12-12 (×3): 100 mg via ORAL
  Filled 2013-12-11 (×4): qty 1

## 2013-12-11 MED ORDER — ACETAMINOPHEN 500 MG PO TABS
500.0000 mg | ORAL_TABLET | Freq: Three times a day (TID) | ORAL | Status: DC
  Administered 2013-12-11 – 2013-12-12 (×5): 500 mg via ORAL
  Filled 2013-12-11 (×6): qty 1

## 2013-12-11 MED ORDER — TRAMADOL HCL 50 MG PO TABS
50.0000 mg | ORAL_TABLET | Freq: Four times a day (QID) | ORAL | Status: DC | PRN
  Administered 2013-12-12: 50 mg via ORAL
  Filled 2013-12-11: qty 1

## 2013-12-11 MED ORDER — POLYETHYLENE GLYCOL 3350 17 G PO PACK
17.0000 g | PACK | Freq: Every day | ORAL | Status: DC
  Administered 2013-12-11 – 2013-12-12 (×2): 17 g via ORAL
  Filled 2013-12-11 (×2): qty 1

## 2013-12-11 NOTE — Progress Notes (Signed)
[facility] PROGRESS NOTE  Maria [name] XID:[id number] DOB: January 19, 1924 DOA: 12/07/2013 PCP: Mathews Argyle, MD  Assessment/Plan: #1 valgus impacted right femoral neck fracture Secondary to mechanical fall. Patient has been seen by orthopedics and s/p right hemi arthroplasty per Dr Mayer Camel 12/09/13. PT/IOT eval pending. Patient on xarelto. Will likely need SNF. Per orthopedics.  #2 hyponatremia May be secondary to SIADH as patient with high urine sodium and a high urine osmolality. SSRI has been discontinued. Diuretics on hold. Patient with no improvement in hyponatremia with normal saline. TSH within normal limits. Cortisol level at 7.5. Chest x-ray negative. Sodium was as low as 119. Last sodium now at 129.  Nephrology was consulted and patient was started on Samscar. Samscar d/c'd. Continue salt tablets, fluid restriction at 1200 cc/day, lasix 20 mg daily. Repeat cortisol level pending. Renal ff.  #3 anemia/prob ABLA Likely anemia of chronic disease versus dilutional vs ABLA periop..  No overt bleeding. Anemia panel. Follow H&H.  #4. history of recent DVT Xarelto resumed.  #5 history of coronary artery disease status post CABG/history of chronic diastolic CHF Stable. Spironolactone on hold secondary to problem #2.  Xarelto resumed. Lasix started per renal. Follow.  #6 hypothyroidism Continue Synthroid.  #7 urinary tract infection Urine cultures are pending. Continue IV Rocephin.  #8 Confusion Maybe secondary to UTI. Will d/c narcotic pain meds. Follow.  #9 Atonic bladder I/O cath PRN.  #10 prophylaxis On xarelto for DVT prophylaxis.  Code Status: Full Family Communication: Updated patient and husband at bedside. Disposition Plan: Probable SNF vs CIR when medically stable.   Consultants:  Orthopedics: Dr. Mayer Camel 12/08/2013  Nephrology: Dr. Jonnie Finner 12/08/2013  Procedures:  X-ray of the right hip 12/07/2013  Chest x-ray 12/07/2013  X-ray of the C-spine  12/07/2013  R hemi-hip arthroplasty per Dr Mayer Camel 12/09/13  Antibiotics:  IV Rocephin 12/08/2013  HPI/Subjective: Patient tolerated salt tablets yesterday afternoon after mixing it. Patient per husband with hx of atonic bladder. Patient with some confusion per husband last night.  Objective: Filed Vitals:   12/11/13 0536  BP: 114/42  Pulse: 79  Temp: 99.3 F (37.4 C)  Resp: 16    Intake/Output Summary (Last 24 hours) at 12/11/13 1040 Last data filed at 12/11/13 0350  Gross per 24 hour  Intake    365 ml  Output   1000 ml  Net   -635 ml   Filed Weights   12/07/13 1857  Weight: 44.453 kg (98 lb)    Exam:   General:  nad  Cardiovascular: RRR  Respiratory: CTAB anterior lung fields  Abdomen: Soft/NT/ND/+BS  Musculoskeletal: Right hip pain. No c/c/e  Data Reviewed: Basic Metabolic Panel:  Recent Labs Lab 12/08/13 1405  12/09/13 0158 12/09/13 0900 12/09/13 1132 12/09/13 1941 12/10/13 1015 12/11/13 0413  NA 121*  < > 127* 131* 130* 133* 129* 129*  K 4.6  --  4.8  --  4.5  --  4.3 4.1  CL 90*  --  94*  --  97  --  97 95*  CO2 19  --  21  --  21  --  21 21  GLUCOSE 82  --  90  --  83  --  108* 103*  BUN 11  --  11  --  9  --  7 11  CREATININE 0.63  --  0.67  --  0.59  --  0.57 0.65  CALCIUM 8.4  --  8.8  --  8.9  --  8.3* 8.3*  < > =  values in this interval not displayed. Liver Function Tests: No results found for this basename: AST, ALT, ALKPHOS, BILITOT, PROT, ALBUMIN,  in the last 168 hours No results found for this basename: LIPASE, AMYLASE,  in the last 168 hours No results found for this basename: AMMONIA,  in the last 168 hours CBC:  Recent Labs Lab 12/07/13 2028 12/08/13 0543 12/09/13 0158 12/10/13 1015 12/11/13 0413  WBC 10.7* 9.2 8.9 11.9* 12.4*  NEUTROABS 8.6*  --   --   --   --   HGB 11.1* 11.1* 10.6* 10.0* 9.0*  HCT 32.0* 32.4* 31.7* 30.3* 27.7*  MCV 79.8 80.2 80.7 81.9 82.4  PLT 360 373 315 315 290   Cardiac Enzymes: No results  found for this basename: CKTOTAL, CKMB, CKMBINDEX, TROPONINI,  in the last 168 hours BNP (last 3 results)  Recent Labs  09/05/13 0810 09/12/13 0011  PROBNP 3947.0* 1314.0*   CBG:  Recent Labs Lab 12/10/13 1622  GLUCAP 122*    Recent Results (from the past 240 hour(s))  URINE CULTURE     Status: None   Collection Time    12/07/13  8:55 PM      Result Value Ref Range Status   Specimen Description URINE, CATHETERIZED   Final   Special Requests NONE   Final   Culture  Setup Time     Final   Value: 12/08/2013 17:48     Performed at [facility]     Final   Value: >=100,000 COLONIES/ML     Performed at [facility]   Culture     Final   Value: [place]     Performed at [facility]   Report Status PENDING   Incomplete  MRSA PCR SCREENING     Status: None   Collection Time    12/09/13  5:38 AM      Result Value Ref Range Status   MRSA by PCR NEGATIVE  NEGATIVE Final   Comment:            The GeneXpert MRSA Assay (FDA     approved for NASAL specimens     only), is one component of a     comprehensive MRSA colonization     surveillance program. It is not     intended to diagnose MRSA     infection nor to guide or     monitor treatment for     MRSA infections.     Studies: Dg Pelvis Portable  12/09/2013   CLINICAL DATA:  Right hip replacement.  EXAM: PORTABLE PELVIS 1-2 VIEWS  COMPARISON:  Plain films right hip 12/07/2013.  FINDINGS: The patient has a new bipolar right hip hemiarthroplasty. Gas in the soft tissues from surgery noted. The device is located and there is no fracture.  IMPRESSION: Right hip replacement without evidence of complication.   Electronically Signed   By: Inge Rise M.D.   On: 12/09/2013 19:13    Scheduled Meds: . acetaminophen  500 mg Oral TID  . cefTRIAXone (ROCEPHIN)  IV  1 g Intravenous Q24H  . docusate sodium  100 mg Oral BID  . furosemide  20 mg Oral Daily  . levothyroxine  50 mcg  Oral QAC breakfast  . polyethylene glycol  17 g Oral Daily  . Rivaroxaban  20 mg Oral Q supper  . sodium chloride  2 g Oral TID WC  . tiZANidine  2 mg Oral TID  Continuous Infusions:    Principal Problem:   Hip fracture, right Active Problems:   Essential hypertension, benign   Paroxysmal atrial fibrillation   Chronic diastolic heart failure   Coronary atherosclerosis of native coronary artery   DVT (deep venous thrombosis)   Anticoagulation goal of INR 2.5 to 3.5   Closed right hip fracture   UTI (lower urinary tract infection)   Hyponatremia   Fall   Atonic bladder   Acute confusional state    Time spent: [facility] Pager [phone]. If 7PM-7AM, please contact night-coverage at [email or web address], password [email or web address] 12/11/2013, 10:40 AM  LOS: 4 days

## 2013-12-11 NOTE — Progress Notes (Signed)
I contacted pt's spouse by phone, pt in procedure with nursing. We discussed inpt rehab admission for her rehab recovery. Spouse prefers inpt rehab rather than SNF at [facility] where they live in independent living. I will follow up tomorrow to clarify if pt medically ready to admit tomorrow. I will alert RN CM and SW. [phone]

## 2013-12-11 NOTE — Progress Notes (Signed)
Patient ID: DESHUNDA THACKSTON, female   DOB: Aug 22, 1924, 80 y.o.   MRN: [id number] PATIENT ID: ALEJANDRO ADCOX  MRN: [id number]  DOB/AGE:  02-12-1924 / 80 y.o.  2 Days Post-Op Procedure(s) (LRB): ARTHROPLASTY BIPOLAR HIP CEMENTED (Right)    PROGRESS NOTE Subjective: Patient is alert, oriented, occasional confusion, the patient has achieved bed to chair with physical therapy but her rehabilitation is going slowly. Social work has evaluated her and feels that she may be a candidate for inpatient rehabilitation which sounds very reasonable. Patient reports pain as   .   Less than it was prior to surgery. Objective: Vital signs in last 24 hours: Filed Vitals:   12/10/13 1309 12/10/13 2058 12/10/13 2302 12/11/13 0536  BP: 90/42 101/39  114/42  Pulse: 74 77  79  Temp: 99.5 F (37.5 C) 99.1 F (37.3 C) 98.4 F (36.9 C) 99.3 F (37.4 C)  TempSrc:      Resp: 18 16  16   Height:      Weight:      SpO2: 98% 98%  96%      Intake/Output from previous day: I/O last 3 completed shifts: In: 53 [P.O.:665] Out: 1150 [Urine:1150]   Intake/Output this shift:     LABORATORY DATA:  Recent Labs  12/10/13 1015 12/10/13 1622 12/11/13 0413  WBC 11.9*  --  12.4*  HGB 10.0*  --  9.0*  HCT 30.3*  --  27.7*  PLT 315  --  290  NA 129*  --  129*  K 4.3  --  4.1  CL 97  --  95*  CO2 21  --  21  BUN 7  --  11  CREATININE 0.57  --  0.65  GLUCOSE 108*  --  103*  GLUCAP  --  122*  --   CALCIUM 8.3*  --  8.3*    Examination: Neurologically intact ABD soft Neurovascular intact Sensation intact distally Intact pulses distally Dorsiflexion/Plantar flexion intact Incision: no drainage No cellulitis present Compartment soft} XR AP&Lat of hip shows well placed\fixed THA  Assessment:   2 Days Post-Op Procedure(s) (LRB): ARTHROPLASTY BIPOLAR HIP CEMENTED (Right) ADDITIONAL DIAGNOSIS:  Hypertension, Cardiac Arrythmia Atrial fibrillation and Depressive disorder with mild dementia, history of  mitral regurgitation, pulmonary hypertension  Plan: PT/OT WBAT, THA  posterior precautions  DVT Prophylaxis: SCDx72 hrs, ASA 325 mg BID x 2 weeks  DISCHARGE PLAN: Inpatient Rehab   DISCHARGE NEEDS: HHPT, HHRN, CPM, Walker and 3-in-1 comode seat

## 2013-12-11 NOTE — Progress Notes (Signed)
Patient ID: Maria [name], female   DOB: 21-Jan-1924, 80 y.o.   MRN: [id number] S:events of last 24 hours noted and pt reports a h/o urinary retention but "got better", feels "rough" today O:BP 98/50  Pulse 64  Temp(Src) 97.6 F (36.4 C) (Oral)  Resp 16  Ht 5' 1.5" (1.562 m)  Wt 44.453 kg (98 lb)  BMI 18.22 kg/m2  SpO2 96%  Intake/Output Summary (Last 24 hours) at 12/11/13 1459 Last data filed at 12/11/13 0350  Gross per 24 hour  Intake    240 ml  Output   1000 ml  Net   -760 ml   Intake/Output: I/O last 3 completed shifts: In: 665 [P.O.:665] Out: 1150 [Urine:1150]  Intake/Output this shift:    Weight change:  UDJ:SHFWY, elderly WF in NAd CVS:no  rub Resp:cta OVZ:CHYIFO Ext:no edema   Recent Labs Lab 12/07/13 2028 12/08/13 0543 12/08/13 1405 12/08/13 2000 12/09/13 0158 12/09/13 0900 12/09/13 1132 12/09/13 1941 12/10/13 1015 12/11/13 0413  NA 124* 125* 121* 119* 127* 131* 130* 133* 129* 129*  K 4.5 4.8 4.6  --  4.8  --  4.5  --  4.3 4.1  CL 89* 92* 90*  --  94*  --  97  --  97 95*  CO2 20 21 19   --  21  --  21  --  21 21  GLUCOSE 97 93 82  --  90  --  83  --  108* 103*  BUN 13 10 11   --  11  --  9  --  7 11  CREATININE 0.70 0.59 0.63  --  0.67  --  0.59  --  0.57 0.65  CALCIUM 9.1 8.9 8.4  --  8.8  --  8.9  --  8.3* 8.3*   Liver Function Tests: No results found for this basename: AST, ALT, ALKPHOS, BILITOT, PROT, ALBUMIN,  in the last 168 hours No results found for this basename: LIPASE, AMYLASE,  in the last 168 hours No results found for this basename: AMMONIA,  in the last 168 hours CBC:  Recent Labs Lab 12/07/13 2028 12/08/13 0543 12/09/13 0158 12/10/13 1015 12/11/13 0413  WBC 10.7* 9.2 8.9 11.9* 12.4*  NEUTROABS 8.6*  --   --   --   --   HGB 11.1* 11.1* 10.6* 10.0* 9.0*  HCT 32.0* 32.4* 31.7* 30.3* 27.7*  MCV 79.8 80.2 80.7 81.9 82.4  PLT 360 373 315 315 290   Cardiac Enzymes: No results found for this basename: CKTOTAL, CKMB, CKMBINDEX,  TROPONINI,  in the last 168 hours CBG:  Recent Labs Lab 12/10/13 1622  GLUCAP 122*    Iron Studies: No results found for this basename: IRON, TIBC, TRANSFERRIN, FERRITIN,  in the last 72 hours Studies/Results: Dg Pelvis Portable  12/09/2013   CLINICAL DATA:  Right hip replacement.  EXAM: PORTABLE PELVIS 1-2 VIEWS  COMPARISON:  Plain films right hip 12/07/2013.  FINDINGS: The patient has a new bipolar right hip hemiarthroplasty. Gas in the soft tissues from surgery noted. The device is located and there is no fracture.  IMPRESSION: Right hip replacement without evidence of complication.   Electronically Signed   By: Inge Rise M.D.   On: 12/09/2013 19:13   . acetaminophen  500 mg Oral TID  . cefTRIAXone (ROCEPHIN)  IV  1 g Intravenous Q24H  . docusate sodium  100 mg Oral BID  . furosemide  20 mg Oral Daily  . levothyroxine  50 mcg Oral QAC  breakfast  . polyethylene glycol  17 g Oral Daily  . Rivaroxaban  20 mg Oral Q supper  . sodium chloride  2 g Oral TID WC  . tiZANidine  2 mg Oral TID    BMET    Component Value Date/Time   NA 129* 12/11/2013 0413   K 4.1 12/11/2013 0413   CL 95* 12/11/2013 0413   CO2 21 12/11/2013 0413   GLUCOSE 103* 12/11/2013 0413   BUN 11 12/11/2013 0413   CREATININE 0.65 12/11/2013 0413   CALCIUM 8.3* 12/11/2013 0413   GFRNONAA 78* 12/11/2013 0413   GFRAA 90* 12/11/2013 0413   CBC    Component Value Date/Time   WBC 12.4* 12/11/2013 0413   RBC 3.36* 12/11/2013 0413   HGB 9.0* 12/11/2013 0413   HCT 27.7* 12/11/2013 0413   PLT 290 12/11/2013 0413   MCV 82.4 12/11/2013 0413   MCH 26.8 12/11/2013 0413   MCHC 32.5 12/11/2013 0413   RDW 16.6* 12/11/2013 0413   LYMPHSABS 1.5 12/07/2013 2028   MONOABS 0.5 12/07/2013 2028   EOSABS 0.0 12/07/2013 2028   BASOSABS 0.0 12/07/2013 2028    Assessment/Plan:  1. Hyponatremia- likely SIADH (possibly due to SSRI) but also on DDx would be reset osmostat and/or adrenal insufficiency (low cortisol of 7.5). Sodium had improved  with samsca and now drop in Na with salt tabs and free water restriction  1. Stable after adding lasix 48m po daily  2. Cont with fluid restriction and salt tabs and follow Na closely as we may need to restart samsca 3. Recheck am cortisol level was more appropriate.  Hold off on solucortef 2. R hip fx- s/p THA on 12/09/13 3. CHF (diastolic)- euvolemic 4. Urinary retention- cont with prn intermittent I&O caths 5. HTN- low, holding meds. 6. CAD s/p CABG 11/14 7. LLE DVT 12/14 8. Hypothyroidism- TSH 2.175 9. ABLA- follow h/h 10. Dispo- being eval for CIR vs. SNF 11.    [place]

## 2013-12-11 NOTE — Progress Notes (Signed)
Physical Therapy Treatment Patient Details Name: Maria [name] MRN: [id number] DOB: [birth year] Today's Date: 12/11/2013 Time: 1694-5038 (minus 10 minutes on commode) PT Time Calculation (min): 68 min  PT Assessment / Plan / Recommendation  History of Present Illness Admitted with fall resulting in supcapital R hip fx; now s/p THA posterior prec WBAt   PT Comments   Making gains in mobility and activity tolerance; Still with significant deficits in R stance stability and balance Continue to recommend comprehensive inpatient rehab (CIR) for post-acute therapy needs.   Follow Up Recommendations  CIR     Does the patient have the potential to tolerate intense rehabilitation     Barriers to Discharge        Equipment Recommendations  Rolling walker with 5" wheels;3in1 (PT)    Recommendations for Other Services OT consult;Rehab consult  Frequency 7X/week   Progress towards PT Goals Progress towards PT goals: Progressing toward goals  Plan Current plan remains appropriate    Precautions / Restrictions Precautions Precautions: Posterior Hip;Fall Precaution Booklet Issued: Yes (comment) Precaution Comments: Posterior precautions posted in room; Pt and husband educated -- will need reinforcement Restrictions Weight Bearing Restrictions: Yes RLE Weight Bearing: Weight bearing as tolerated   Pertinent Vitals/Pain 8/10 R hip with any motion patient repositioned for comfort     Mobility  Bed Mobility Overal bed mobility: Needs Assistance;+2 for physical assistance Bed Mobility: Supine to Sit Supine to sit: +2 for physical assistance;Total assist General bed mobility comments: Required phsyical assistance for all aspects of bed mobility; step-by-step cues for technique Transfers Overall transfer level: Needs assistance Equipment used: Rolling walker (2 wheeled) Transfers: Sit to/from Stand Sit to Stand: +2 safety/equipment;Mod assist General transfer comment: Cues for  technqiue and post hip prec; Needing more assist against gravity for rise than last session; Max cues to pre-position RLE for post prec Ambulation/Gait Ambulation/Gait assistance: +2 physical assistance Ambulation Distance (Feet): 12 Feet Assistive device: Rolling walker (2 wheeled) Gait Pattern/deviations: Step-to pattern;Decreased stance time - right;Decreased step length - right;Decreased step length - left Gait velocity: quite slow General Gait Details: Continued cues for gait sequence; Required physical assist to advance RLE; Verbal and tactile cues to activate quad and hip extensors for better stance stability RLE    Exercises Total Joint Exercises Quad Sets: AROM;Right;5 reps Heel Slides: AAROM;Right (Attemped, pt with significant muscle guarding)   PT Diagnosis:    PT Problem List:   PT Treatment Interventions:     PT Goals (current goals can now be found in the care plan section) Acute Rehab PT Goals Patient Stated Goal: did not state PT Goal Formulation: With patient Time For Goal Achievement: 12/17/13 Potential to Achieve Goals: Good  Visit Information  Last PT Received On: 12/11/13 Assistance Needed: +2 History of Present Illness: Admitted with fall resulting in supcapital R hip fx; now s/p THA posterior prec WBAt    Subjective Data  Patient Stated Goal: did not state   Cognition  Cognition Arousal/Alertness: Awake/alert Behavior During Therapy: WFL for tasks assessed/performed Overall Cognitive Status: Within Functional Limits for tasks assessed    Balance  Balance Overall balance assessment: Needs assistance Sitting-balance support: Bilateral upper extremity supported Sitting balance-Leahy Scale: Poor Sitting balance - Comments: has a hard time coming forwar for sit>stand; tending to sit with posterior pelvic tilt; tactile cueing at sacrum to help increase ant pelvic tilt; cues also for trunk extension; Pt with difficulty scooting in bed, recliner, and to  position self better on BSC due to  post pelvic tile and difficulty weight shifting Postural control: Posterior lean Standing balance support: Bilateral upper extremity supported Standing balance-Leahy Scale: Poor Standing balance comment: stands with hips and knees flexed, noted some extension for fully upright standing (2 instances), but pt tends to sink back into knees, hips, trunk flexed quickly  End of Session PT - End of Session Activity Tolerance: Patient limited by pain;Patient tolerated treatment well Patient left: in chair;with call bell/phone within reach;with family/visitor present Nurse Communication: Mobility status   GP     Roney [facility] [place], [place]  12/11/2013, 1:38 PM

## 2013-12-11 NOTE — Care Management Note (Signed)
CARE MANAGEMENT NOTE 12/11/2013  Patient:  Maria [name], Maria [name]   Account Number:  [id number]  Date Initiated:  12/10/2013  Documentation initiated by:  Ricki Miller  Subjective/Objective Assessment:   80 yr old female s/p right hip hemiarthroplasty.     Action/Plan:   Physical therapy and Occupational therapy are reccommending CIR.  CM will follow   Anticipated DC Date:  12/11/2013   Anticipated DC Plan:  IP REHAB FACILITY      DC Planning Services  CM consult      Choice offered to / List presented to:             Status of service:  Completed, signed off Medicare Important Message given?   (If response is "NO", the following Medicare IM given date fields will be blank) Date Medicare IM given:   Date Additional Medicare IM given:    Discharge Disposition:  IP REHAB FACILITY  Per UR Regulation:    If discussed at Long Length of Stay Meetings, dates discussed:    Comments:  12/11/13 4:32pm Ricki Miller, RN BSN Case Manager Patient has been approved for admittance to CIR per Danne Baxter, RN Inpt coordinator.

## 2013-12-11 NOTE — Consult Note (Signed)
Physical Medicine and Rehabilitation Consult Reason for Consult: Right femoral neck fracture Referring Physician: [facility]   HPI: Maria [name] is a 80 y.o. right-handed female with history of CAD/CABG November 2014, hypertension, systolic congestive heart failure, DVT 2014 as well as atrial fibrillation maintained on Xarelto and hyponatremia 125-129. Presented 12/08/2013 after a fall when she slipped and fell on hardwood flooring without loss of consciousness landing on her right hip. X-rays and imaging revealed right femoral neck fracture. Preop hyponatremia 124 received intravenous fluids of normal saline of 1 L. Underwent right hip hemiarthroplasty 12/09/2013 per Dr. Mayer Camel. Weightbearing as tolerated with posterior hip cautions.Xarelto resumed for history of atrial fibrillation DVT postoperatively. Her postoperative pain control. Acute blood loss anemia 9.0 and monitored. Latest sodium level 129 and remains on fluid restriction. Physical and occupational therapy evaluations completed 12/10/2013 with recommendations for physical medicine rehabilitation consult to consider inpatient rehabilitation services  Patient's husband who is a retired physician states that the patient has a history of SIADH  Review of Systems  HENT: Positive for hearing loss.   Respiratory: Positive for shortness of breath.   Cardiovascular: Positive for palpitations and leg swelling.  Gastrointestinal: Positive for constipation.       GERD  Neurological:       Restless legs  Psychiatric/Behavioral: Positive for depression. The patient has insomnia.   All other systems reviewed and are negative.   Past Medical History  Diagnosis Date  . Osteoporosis   . Celiac disease   . Celiac disease   . Carotid artery occlusion     right carotid stenosis 40-60%, 4/08, neg for stenosis repeat study 11/11  . Depression   . Presbycusis   . Macular degeneration   . Urethral stricture   . Moderate aortic  insufficiency   . Moderate mitral regurgitation by prior echocardiogram     echo 9/13   . Thyroid nodule     24m, no change 11/11  . RLS (restless legs syndrome)   . Vitamin D deficiency   . Heart murmur   . Pulmonary hypertension   . Hypertension     takes Metoprolol daily  . Atrial fibrillation   . Coronary artery disease   . CHF (congestive heart failure)     takes Lasix daily  . MVP (mitral valve prolapse)   . Moderate obstructive sleep apnea     uses CPAP;sleep study about 4-550monthago  . Hearing loss     both ears  . Numbness     left arm  . Joint swelling   . GERD (gastroesophageal reflux disease)     takes Omeprazole daily  . Gastric ulcer     [id number]  . GI bleeding   . Constipation   . Diarrhea   . Hemorrhoids   . Urinary retention   . Urinary anastomotic stricture   . Hypothyroidism     takes Synthroid daily as result of AMiodarone  . Hot flashes     takes ZOloft 3 times a week  . Insomnia     takes Melatonin nightly  . Restless leg   . Peripheral edema     left  . DVT (deep venous thrombosis)    Past Surgical History  Procedure Laterality Date  . Appendectomy    . Tubal ligation    . Tonsillectomy    . Trigger thumb    . Cardiac catheterization  08-06-13  . Tcs    . Bilateral cataract surgery    .  Mandible surgery    . Coronary artery bypass graft N/A 08/28/2013    Procedure: CORONARY ARTERY BYPASS GRAFTING (CABG) x2 using right greater saphenous vein and left internal mammary artery. ;  Surgeon: Grace Isaac, MD;  Location: [facility];  Service: Open Heart Surgery;  Laterality: N/A;  . Intraoperative transesophageal echocardiogram N/A 08/28/2013    Procedure: INTRAOPERATIVE TRANSESOPHAGEAL ECHOCARDIOGRAM;  Surgeon: Grace Isaac, MD;  Location: [facility];  Service: Open Heart Surgery;  Laterality: N/A;   Family History  Problem Relation Age of Onset  . Heart attack Mother   . CVA Mother   . CAD Father   . Colon cancer Father   . Heart  attack Father   . Heart attack Brother   . Peripheral vascular disease Brother   . AAA (abdominal aortic aneurysm) Brother    Social History:  reports that she has never smoked. She has never used smokeless tobacco. She reports that she does not drink alcohol or use illicit drugs. Allergies:  Allergies  Allergen Reactions  . Amoxicillin Other (See Comments)    Nausea, dizziness  . Codeine Nausea And Vomiting  . Fosamax [Alendronate Sodium] Other (See Comments)    Jaw pain  . Gluten Meal Other (See Comments)    Celiac Disease  . Hctz [Hydrochlorothiazide] Other (See Comments)    Hyponatremia, GI upset  . Tetanus Toxoids Other (See Comments)    Bad local reaction   Medications Prior to Admission  Medication Sig Dispense Refill  . acetaminophen (TYLENOL) 325 MG tablet Take 650 mg by mouth every 6 (six) hours as needed.      . docusate sodium (COLACE) 100 MG capsule Take 100 mg by mouth daily as needed.       Marland Kitchen levothyroxine (SYNTHROID, LEVOTHROID) 50 MCG tablet Take 1 tablet (50 mcg total) by mouth daily before breakfast.  30 tablet  11  . magnesium hydroxide (MILK OF MAGNESIA) 400 MG/5ML suspension Take 5 mLs by mouth daily as needed for mild constipation.       . Melatonin 3 MG TABS Take 1 tablet by mouth at bedtime as needed (sleep).      . MULTIPLE VITAMIN PO Take 1 tablet by mouth daily.      Marland Kitchen omeprazole (PRILOSEC) 20 MG capsule Take 20 mg by mouth every other day.       . Rivaroxaban (XARELTO) 20 MG TABS tablet Take 1 tablet (20 mg total) by mouth daily with supper. Start January 3rd.  30 tablet  5  . sertraline (ZOLOFT) 50 MG tablet Take 50 mg by mouth every other day.      . spironolactone (ALDACTONE) 25 MG tablet Take 1 tablet (25 mg total) by mouth daily.  30 tablet  11  . LORazepam (ATIVAN) 0.5 MG tablet Take 1 tablet (0.5 mg total) by mouth at bedtime.  30 tablet  0    Home: Home Living Family/patient expects to be discharged to:: Inpatient rehab Living Arrangements:  Spouse/significant other  Functional History:   Functional Status:  Mobility:     Ambulation/Gait Ambulation Distance (Feet): 3 Feet General Gait Details: Cues for gait sequence and technique; phsyical assist to advance RLE; Pt essentially TDWB RLE    ADL: ADL Eating/Feeding: Set up;Supervision/safety Where Assessed - Eating/Feeding: Chair Grooming: Set up;Supervision/safety Where Assessed - Grooming: Supported sitting Upper Body Bathing: Supervision/safety;Set up Where Assessed - Upper Body Bathing: Supported sitting Lower Body Bathing: +1 Total assistance Where Assessed - Lower Body Bathing: Supported sit to  stand Upper Body Dressing: Maximal assistance Where Assessed - Upper Body Dressing: Supported sitting Lower Body Dressing: +1 Total assistance Where Assessed - Lower Body Dressing: Supported sit to Lobbyist: +2 Total assistance Toilet Transfer Method: Arts development officer:  (Recliner>bed going to pt's left) Equipment Used: Gait belt;Rolling walker Transfers/Ambulation Related to ADLs: total A +2 Mod  Cognition: Cognition Overall Cognitive Status: Impaired/Different from baseline ("are you getting ready to use the microwave in here"--I had told her that I was untangling her O2 tubing) Orientation Level: Oriented X4 Cognition Arousal/Alertness: Lethargic Behavior During Therapy: WFL for tasks assessed/performed Overall Cognitive Status: Impaired/Different from baseline ("are you getting ready to use the microwave in here"--I had told her that I was untangling her O2 tubing)  Blood pressure 114/42, pulse 79, temperature 99.3 F (37.4 C), temperature source Oral, resp. rate 16, height 5' 1.5" (1.562 m), weight 44.453 kg (98 lb), SpO2 96.00%. Physical Exam  Vitals reviewed. Constitutional: She is oriented to person, place, and time.  HENT:  Head: Normocephalic.  Eyes: EOM are normal.  Neck: Normal range of motion. Neck supple. No  thyromegaly present.  Cardiovascular:  Cardiac rate controlled  Respiratory: Effort normal and breath sounds normal. No respiratory distress.  GI: Soft. Bowel sounds are normal. She exhibits no distension.  Neurological: She is alert and oriented to person, place, and time.  Follows commands  Skin:  Hip incision dressed and appropriately tender   motor strength is 4/5 bilateral deltoid, bicep, tricep, grip 3 minus at the left hip flexor knee extensor ankle dorsiflexor plantar flexor Trace right hip flexor knee extensor and 3 minus right ankle dorsiflexor plantar flexor Tenderness to palpation along the distal thigh and calf area right lower extremity No evidence of drainage from hip wound  Results for orders placed during the hospital encounter of 12/07/13 (from the past 24 hour(s))  BASIC METABOLIC PANEL     Status: Abnormal   Collection Time    12/10/13 10:15 AM      Result Value Ref Range   Sodium 129 (*) 137 - 147 mEq/L   Potassium 4.3  3.7 - 5.3 mEq/L   Chloride 97  96 - 112 mEq/L   CO2 21  19 - 32 mEq/L   Glucose, Bld 108 (*) 70 - 99 mg/dL   BUN 7  6 - 23 mg/dL   Creatinine, Ser 0.57  0.50 - 1.10 mg/dL   Calcium 8.3 (*) 8.4 - 10.5 mg/dL   GFR calc non Af Amer 81 (*) >90 mL/min   GFR calc Af Amer >90  >90 mL/min  CBC     Status: Abnormal   Collection Time    12/10/13 10:15 AM      Result Value Ref Range   WBC 11.9 (*) 4.0 - 10.5 K/uL   RBC 3.70 (*) 3.87 - 5.11 MIL/uL   Hemoglobin 10.0 (*) 12.0 - 15.0 g/dL   HCT 30.3 (*) 36.0 - 46.0 %   MCV 81.9  78.0 - 100.0 fL   MCH 27.0  26.0 - 34.0 pg   MCHC 33.0  30.0 - 36.0 g/dL   RDW 16.5 (*) 11.5 - 15.5 %   Platelets 315  150 - 400 K/uL  GLUCOSE, CAPILLARY     Status: Abnormal   Collection Time    12/10/13  4:22 PM      Result Value Ref Range   Glucose-Capillary 122 (*) 70 - 99 mg/dL  TSH     Status: None  Collection Time    12/10/13  4:25 PM      Result Value Ref Range   TSH 2.175  0.350 - 4.500 uIU/mL  URINALYSIS,  ROUTINE W REFLEX MICROSCOPIC     Status: Abnormal   Collection Time    12/11/13  3:53 AM      Result Value Ref Range   Color, Urine AMBER (*) YELLOW   APPearance CLEAR  CLEAR   Specific Gravity, Urine 1.018  1.005 - 1.030   pH 5.5  5.0 - 8.0   Glucose, UA NEGATIVE  NEGATIVE mg/dL   Hgb urine dipstick MODERATE (*) NEGATIVE   Bilirubin Urine NEGATIVE  NEGATIVE   Ketones, ur NEGATIVE  NEGATIVE mg/dL   Protein, ur NEGATIVE  NEGATIVE mg/dL   Urobilinogen, UA 0.2  0.0 - 1.0 mg/dL   Nitrite NEGATIVE  NEGATIVE   Leukocytes, UA NEGATIVE  NEGATIVE  URINE MICROSCOPIC-ADD ON     Status: None   Collection Time    12/11/13  3:53 AM      Result Value Ref Range   WBC, UA 0-2  <3 WBC/hpf   RBC / HPF 3-6  <3 RBC/hpf   Bacteria, UA RARE  RARE  CBC     Status: Abnormal   Collection Time    12/11/13  4:13 AM      Result Value Ref Range   WBC 12.4 (*) 4.0 - 10.5 K/uL   RBC 3.36 (*) 3.87 - 5.11 MIL/uL   Hemoglobin 9.0 (*) 12.0 - 15.0 g/dL   HCT 27.7 (*) 36.0 - 46.0 %   MCV 82.4  78.0 - 100.0 fL   MCH 26.8  26.0 - 34.0 pg   MCHC 32.5  30.0 - 36.0 g/dL   RDW 16.6 (*) 11.5 - 15.5 %   Platelets 290  150 - 400 K/uL  BASIC METABOLIC PANEL     Status: Abnormal   Collection Time    12/11/13  4:13 AM      Result Value Ref Range   Sodium 129 (*) 137 - 147 mEq/L   Potassium 4.1  3.7 - 5.3 mEq/L   Chloride 95 (*) 96 - 112 mEq/L   CO2 21  19 - 32 mEq/L   Glucose, Bld 103 (*) 70 - 99 mg/dL   BUN 11  6 - 23 mg/dL   Creatinine, Ser 0.65  0.50 - 1.10 mg/dL   Calcium 8.3 (*) 8.4 - 10.5 mg/dL   GFR calc non Af Amer 78 (*) >90 mL/min   GFR calc Af Amer 90 (*) >90 mL/min   Dg Pelvis Portable  12/09/2013   CLINICAL DATA:  Right hip replacement.  EXAM: PORTABLE PELVIS 1-2 VIEWS  COMPARISON:  Plain films right hip 12/07/2013.  FINDINGS: The patient has a new bipolar right hip hemiarthroplasty. Gas in the soft tissues from surgery noted. The device is located and there is no fracture.  IMPRESSION: Right hip  replacement without evidence of complication.   Electronically Signed   By: Inge Rise M.D.   On: 12/09/2013 19:13    Assessment/Plan: Diagnosis: Right femoral neck fracture secondary to fall 1. Does the need for close, 24 hr/day medical supervision in concert with the patient's rehab needs make it unreasonable for this patient to be served in a less intensive setting? Yes 2. Co-Morbidities requiring supervision/potential complications: SIADH with hyponatremia, history of CHF, history of gastric ulcer 3. Due to bladder management, bowel management, safety, skin/wound care, disease management, medication administration, pain management and  patient education, does the patient require 24 hr/day rehab nursing? Yes 4. Does the patient require coordinated care of a physician, rehab nurse, PT (1-2 hrs/day, 5 days/week) and OT (1-2 hrs/day, 5 days/week) to address physical and functional deficits in the context of the above medical diagnosis(es)? Yes Addressing deficits in the following areas: balance, endurance, locomotion, strength, transferring, bowel/bladder control, bathing, dressing, feeding, grooming and toileting 5. Can the patient actively participate in an intensive therapy program of at least 3 hrs of therapy per day at least 5 days per week? Potentially 6. The potential for patient to make measurable gains while on inpatient rehab is good 7. Anticipated functional outcomes upon discharge from inpatient rehab are min assist with PT, min assist with OT, NA with SLP. 8. Estimated rehab length of stay to reach the above functional goals is: 10-18 days 9. Does the patient have adequate social supports to accommodate these discharge functional goals? Yes 10. Anticipated D/C setting: Home 11. Anticipated post D/C treatments: [place] therapy 12. Overall Rehab/Functional Prognosis: good  RECOMMENDATIONS: This patient's condition is appropriate for continued rehabilitative care in the following  setting: CIR Patient has agreed to participate in recommended program. Yes Note that insurance prior authorization may be required for reimbursement for recommended care.  Comment:     12/11/2013

## 2013-12-12 ENCOUNTER — Encounter (HOSPITAL_COMMUNITY): Payer: Self-pay | Admitting: Orthopedic Surgery

## 2013-12-12 ENCOUNTER — Ambulatory Visit (HOSPITAL_COMMUNITY): Payer: Medicare Other

## 2013-12-12 ENCOUNTER — Telehealth: Payer: Self-pay

## 2013-12-12 ENCOUNTER — Inpatient Hospital Stay (HOSPITAL_COMMUNITY)
Admission: RE | Admit: 2013-12-12 | Discharge: 2014-01-03 | DRG: 945 | Disposition: A | Discharge status: Home Health | Payer: Medicare Other | Source: Intra-hospital | Attending: Physical Medicine & Rehabilitation | Admitting: Physical Medicine & Rehabilitation

## 2013-12-12 DIAGNOSIS — I4891 Unspecified atrial fibrillation: Secondary | ICD-10-CM | POA: Diagnosis present

## 2013-12-12 DIAGNOSIS — M810 Age-related osteoporosis without current pathological fracture: Secondary | ICD-10-CM | POA: Diagnosis present

## 2013-12-12 DIAGNOSIS — G4700 Insomnia, unspecified: Secondary | ICD-10-CM | POA: Diagnosis present

## 2013-12-12 DIAGNOSIS — S72009A Fracture of unspecified part of neck of unspecified femur, initial encounter for closed fracture: Secondary | ICD-10-CM | POA: Diagnosis present

## 2013-12-12 DIAGNOSIS — I080 Rheumatic disorders of both mitral and aortic valves: Secondary | ICD-10-CM | POA: Diagnosis present

## 2013-12-12 DIAGNOSIS — D62 Acute posthemorrhagic anemia: Secondary | ICD-10-CM | POA: Diagnosis present

## 2013-12-12 DIAGNOSIS — A498 Other bacterial infections of unspecified site: Secondary | ICD-10-CM | POA: Diagnosis present

## 2013-12-12 DIAGNOSIS — Z5181 Encounter for therapeutic drug level monitoring: Secondary | ICD-10-CM | POA: Diagnosis not present

## 2013-12-12 DIAGNOSIS — I2789 Other specified pulmonary heart diseases: Secondary | ICD-10-CM | POA: Diagnosis present

## 2013-12-12 DIAGNOSIS — W010XXA Fall on same level from slipping, tripping and stumbling without subsequent striking against object, initial encounter: Secondary | ICD-10-CM

## 2013-12-12 DIAGNOSIS — R443 Hallucinations, unspecified: Secondary | ICD-10-CM | POA: Diagnosis present

## 2013-12-12 DIAGNOSIS — Z800 Family history of malignant neoplasm of digestive organs: Secondary | ICD-10-CM

## 2013-12-12 DIAGNOSIS — N390 Urinary tract infection, site not specified: Secondary | ICD-10-CM | POA: Diagnosis present

## 2013-12-12 DIAGNOSIS — M259 Joint disorder, unspecified: Secondary | ICD-10-CM | POA: Diagnosis not present

## 2013-12-12 DIAGNOSIS — I10 Essential (primary) hypertension: Secondary | ICD-10-CM

## 2013-12-12 DIAGNOSIS — I2510 Atherosclerotic heart disease of native coronary artery without angina pectoris: Secondary | ICD-10-CM | POA: Diagnosis not present

## 2013-12-12 DIAGNOSIS — Z951 Presence of aortocoronary bypass graft: Secondary | ICD-10-CM

## 2013-12-12 DIAGNOSIS — Z96649 Presence of unspecified artificial hip joint: Secondary | ICD-10-CM | POA: Diagnosis not present

## 2013-12-12 DIAGNOSIS — S72001A Fracture of unspecified part of neck of right femur, initial encounter for closed fracture: Secondary | ICD-10-CM

## 2013-12-12 DIAGNOSIS — G2581 Restless legs syndrome: Secondary | ICD-10-CM | POA: Diagnosis present

## 2013-12-12 DIAGNOSIS — Z5189 Encounter for other specified aftercare: Secondary | ICD-10-CM | POA: Diagnosis not present

## 2013-12-12 DIAGNOSIS — K5900 Constipation, unspecified: Secondary | ICD-10-CM | POA: Diagnosis present

## 2013-12-12 DIAGNOSIS — K219 Gastro-esophageal reflux disease without esophagitis: Secondary | ICD-10-CM | POA: Diagnosis present

## 2013-12-12 DIAGNOSIS — Z8249 Family history of ischemic heart disease and other diseases of the circulatory system: Secondary | ICD-10-CM

## 2013-12-12 DIAGNOSIS — E236 Other disorders of pituitary gland: Secondary | ICD-10-CM | POA: Diagnosis not present

## 2013-12-12 DIAGNOSIS — E871 Hypo-osmolality and hyponatremia: Secondary | ICD-10-CM

## 2013-12-12 DIAGNOSIS — Z823 Family history of stroke: Secondary | ICD-10-CM

## 2013-12-12 DIAGNOSIS — R262 Difficulty in walking, not elsewhere classified: Secondary | ICD-10-CM | POA: Diagnosis not present

## 2013-12-12 DIAGNOSIS — I25810 Atherosclerosis of coronary artery bypass graft(s) without angina pectoris: Secondary | ICD-10-CM

## 2013-12-12 DIAGNOSIS — I5032 Chronic diastolic (congestive) heart failure: Secondary | ICD-10-CM | POA: Diagnosis present

## 2013-12-12 DIAGNOSIS — Z7901 Long term (current) use of anticoagulants: Secondary | ICD-10-CM | POA: Diagnosis not present

## 2013-12-12 DIAGNOSIS — E039 Hypothyroidism, unspecified: Secondary | ICD-10-CM | POA: Diagnosis present

## 2013-12-12 DIAGNOSIS — Z86718 Personal history of other venous thrombosis and embolism: Secondary | ICD-10-CM

## 2013-12-12 DIAGNOSIS — Z4889 Encounter for other specified surgical aftercare: Secondary | ICD-10-CM

## 2013-12-12 DIAGNOSIS — I509 Heart failure, unspecified: Secondary | ICD-10-CM | POA: Diagnosis present

## 2013-12-12 DIAGNOSIS — R7989 Other specified abnormal findings of blood chemistry: Secondary | ICD-10-CM | POA: Diagnosis present

## 2013-12-12 DIAGNOSIS — H3530 Unspecified macular degeneration: Secondary | ICD-10-CM | POA: Diagnosis present

## 2013-12-12 DIAGNOSIS — Z48812 Encounter for surgical aftercare following surgery on the circulatory system: Secondary | ICD-10-CM | POA: Diagnosis not present

## 2013-12-12 DIAGNOSIS — R339 Retention of urine, unspecified: Secondary | ICD-10-CM | POA: Diagnosis present

## 2013-12-12 DIAGNOSIS — I495 Sick sinus syndrome: Secondary | ICD-10-CM | POA: Diagnosis not present

## 2013-12-12 DIAGNOSIS — D649 Anemia, unspecified: Secondary | ICD-10-CM | POA: Diagnosis not present

## 2013-12-12 DIAGNOSIS — R488 Other symbolic dysfunctions: Secondary | ICD-10-CM | POA: Diagnosis not present

## 2013-12-12 LAB — CBC
HCT: 27.8 % — ABNORMAL LOW (ref 36.0–46.0)
Hemoglobin: 9.4 g/dL — ABNORMAL LOW (ref 12.0–15.0)
MCH: 27.8 pg (ref 26.0–34.0)
MCHC: 33.8 g/dL (ref 30.0–36.0)
MCV: 82.2 fL (ref 78.0–100.0)
Platelets: 277 10*3/uL (ref 150–400)
RBC: 3.38 MIL/uL — ABNORMAL LOW (ref 3.87–5.11)
RDW: 17 % — AB (ref 11.5–15.5)
WBC: 10 10*3/uL (ref 4.0–10.5)

## 2013-12-12 LAB — BASIC METABOLIC PANEL
BUN: 15 mg/dL (ref 6–23)
CO2: 22 mEq/L (ref 19–32)
CREATININE: 0.54 mg/dL (ref 0.50–1.10)
Calcium: 8.3 mg/dL — ABNORMAL LOW (ref 8.4–10.5)
Chloride: 95 mEq/L — ABNORMAL LOW (ref 96–112)
GFR, EST NON AFRICAN AMERICAN: 82 mL/min — AB (ref >90)
Glucose, Bld: 112 mg/dL — ABNORMAL HIGH (ref 70–99)
POTASSIUM: 4.4 meq/L (ref 3.7–5.3)
Sodium: 126 mEq/L — ABNORMAL LOW (ref 137–147)

## 2013-12-12 MED ORDER — ACETAMINOPHEN 325 MG PO TABS
650.0000 mg | ORAL_TABLET | Freq: Four times a day (QID) | ORAL | Status: DC | PRN
  Administered 2013-12-12 – 2014-01-01 (×16): 650 mg via ORAL
  Filled 2013-12-12 (×19): qty 2

## 2013-12-12 MED ORDER — TIZANIDINE HCL 2 MG PO TABS
2.0000 mg | ORAL_TABLET | Freq: Three times a day (TID) | ORAL | Status: DC
  Administered 2013-12-12 – 2013-12-13 (×4): 2 mg via ORAL
  Filled 2013-12-12 (×8): qty 1

## 2013-12-12 MED ORDER — BISACODYL 10 MG RE SUPP
10.0000 mg | Freq: Every day | RECTAL | Status: DC | PRN
  Administered 2013-12-12: 10 mg via RECTAL
  Filled 2013-12-12 (×2): qty 1

## 2013-12-12 MED ORDER — RIVAROXABAN 20 MG PO TABS
20.0000 mg | ORAL_TABLET | Freq: Every day | ORAL | Status: DC
  Administered 2013-12-13 – 2014-01-02 (×21): 20 mg via ORAL
  Filled 2013-12-12 (×22): qty 1

## 2013-12-12 MED ORDER — FUROSEMIDE 20 MG PO TABS
20.0000 mg | ORAL_TABLET | Freq: Every day | ORAL | Status: DC
  Administered 2013-12-13 – 2014-01-03 (×22): 20 mg via ORAL
  Filled 2013-12-12 (×24): qty 1

## 2013-12-12 MED ORDER — SODIUM CHLORIDE 1 G PO TABS
2.0000 g | ORAL_TABLET | Freq: Three times a day (TID) | ORAL | Status: DC
  Administered 2013-12-13 – 2013-12-27 (×45): 2 g via ORAL
  Filled 2013-12-12 (×49): qty 2

## 2013-12-12 MED ORDER — DOCUSATE SODIUM 100 MG PO CAPS
100.0000 mg | ORAL_CAPSULE | Freq: Two times a day (BID) | ORAL | Status: DC
  Administered 2013-12-12 – 2013-12-16 (×8): 100 mg via ORAL
  Filled 2013-12-12 (×12): qty 1

## 2013-12-12 MED ORDER — ONDANSETRON HCL 4 MG/2ML IJ SOLN: 4.0000 mg | Freq: Four times a day (QID) | INTRAMUSCULAR | Status: DC | PRN

## 2013-12-12 MED ORDER — LEVOTHYROXINE SODIUM 50 MCG PO TABS
50.0000 ug | ORAL_TABLET | Freq: Every day | ORAL | Status: DC
  Administered 2013-12-13 – 2014-01-03 (×22): 50 ug via ORAL
  Filled 2013-12-12 (×26): qty 1

## 2013-12-12 MED ORDER — LEVOFLOXACIN 500 MG PO TABS
250.0000 mg | ORAL_TABLET | Freq: Every day | ORAL | Status: DC
  Administered 2013-12-12: 250 mg via ORAL
  Filled 2013-12-12: qty 1

## 2013-12-12 MED ORDER — SORBITOL 70 % SOLN
30.0000 mL | Freq: Every day | Status: DC | PRN
  Administered 2013-12-19 – 2013-12-21 (×2): 30 mL via ORAL
  Filled 2013-12-12 (×3): qty 30

## 2013-12-12 MED ORDER — ALUM & MAG HYDROXIDE-SIMETH 200-200-20 MG/5ML PO SUSP
30.0000 mL | Freq: Four times a day (QID) | ORAL | Status: DC | PRN
Start: 2013-12-12 — End: 2014-01-03

## 2013-12-12 MED ORDER — TIZANIDINE HCL 2 MG PO TABS: 2.0000 mg | ORAL_TABLET | Freq: Three times a day (TID) | ORAL | Status: DC

## 2013-12-12 MED ORDER — SPIRONOLACTONE 25 MG PO TABS: 12.5000 mg | ORAL_TABLET | Freq: Every day | ORAL | Status: DC

## 2013-12-12 MED ORDER — SODIUM CHLORIDE 1 G PO TABS: 2.0000 g | ORAL_TABLET | Freq: Three times a day (TID) | ORAL | Status: DC

## 2013-12-12 MED ORDER — CIPROFLOXACIN HCL 500 MG PO TABS
500.0000 mg | ORAL_TABLET | Freq: Two times a day (BID) | ORAL | Status: DC
  Administered 2013-12-12: 500 mg via ORAL
  Filled 2013-12-12 (×3): qty 1

## 2013-12-12 MED ORDER — CIPROFLOXACIN HCL 500 MG PO TABS
500.0000 mg | ORAL_TABLET | Freq: Two times a day (BID) | ORAL | Status: DC
Start: 2013-12-12 — End: 2014-01-03

## 2013-12-12 MED ORDER — SENNA 8.6 MG PO TABS: 1.0000 | ORAL_TABLET | Freq: Every day | ORAL | Status: DC | PRN

## 2013-12-12 MED ORDER — SENNA 8.6 MG PO TABS
1.0000 | ORAL_TABLET | Freq: Every day | ORAL | Status: DC | PRN
  Filled 2013-12-12: qty 1

## 2013-12-12 MED ORDER — POLYETHYLENE GLYCOL 3350 17 G PO PACK
17.0000 g | PACK | Freq: Every day | ORAL | Status: DC
  Administered 2013-12-13 – 2014-01-03 (×20): 17 g via ORAL
  Filled 2013-12-12 (×23): qty 1

## 2013-12-12 MED ORDER — FUROSEMIDE 20 MG PO TABS: 20.0000 mg | ORAL_TABLET | Freq: Every day | ORAL | Status: DC

## 2013-12-12 MED ORDER — TRAMADOL HCL 50 MG PO TABS: 50.0000 mg | ORAL_TABLET | Freq: Four times a day (QID) | ORAL | Status: DC | PRN

## 2013-12-12 MED ORDER — ACETAMINOPHEN 650 MG RE SUPP: 650.0000 mg | Freq: Four times a day (QID) | RECTAL | Status: DC | PRN

## 2013-12-12 MED ORDER — POLYETHYLENE GLYCOL 3350 17 G PO PACK: 17.0000 g | PACK | Freq: Every day | ORAL | Status: DC

## 2013-12-12 MED ORDER — TRAMADOL HCL 50 MG PO TABS
50.0000 mg | ORAL_TABLET | Freq: Four times a day (QID) | ORAL | Status: DC | PRN
  Administered 2013-12-12 – 2013-12-21 (×18): 50 mg via ORAL
  Filled 2013-12-12 (×18): qty 1

## 2013-12-12 MED ORDER — ALUM & MAG HYDROXIDE-SIMETH 200-200-20 MG/5ML PO SUSP: 30.0000 mL | Freq: Four times a day (QID) | ORAL | Status: DC | PRN

## 2013-12-12 MED ORDER — LEVOFLOXACIN 250 MG PO TABS
250.0000 mg | ORAL_TABLET | Freq: Every day | ORAL | Status: AC
  Administered 2013-12-13 – 2013-12-19 (×7): 250 mg via ORAL
  Filled 2013-12-12 (×8): qty 1

## 2013-12-12 MED ORDER — ONDANSETRON HCL 4 MG PO TABS: 4.0000 mg | ORAL_TABLET | Freq: Four times a day (QID) | ORAL | Status: DC | PRN

## 2013-12-12 NOTE — PMR Pre-admission (Signed)
PMR Admission Coordinator Pre-Admission Assessment  Patient: Maria [name] is an 80 y.o., female MRN: [id number] DOB: 03-06-1924 Height: 5' 1.5" (156.2 cm) Weight: 44.453 kg (98 lb)              Insurance Information HMO:     PPO:      PCP:      IPA:      80/20: yes     OTHER: no HMO PRIMARY: Medicare a and b      Policy#: [id number]      Subscriber: pt Benefits:  Phone #: online     Name: 12/12/13 Eff. Date: a 02/23/1991 b 04/24/1992     Deduct: $1260      Out of Pocket Max: none      Life Max: none CIR: 100%      SNF: 20 full days LBD none Outpatient: 80%     Co-Pay: 20% Home Health: 100%      Co-Pay: none DME: 80%     Co-Pay: 20% Providers: pt choice  SECONDARY: AARP supplement      Policy#: [id number]      Subscriber: pt  Emergency Contact Information Contact Information   Name Relation Home Work [place] Spouse [phone]  [phone]     Current Medical History  Patient Admitting Diagnosis: Right femoral neck fracture secondary to fall  History of Present Illness: Maria [name] is a 80 y.o. right-handed female with history of CAD/CABG November 2014, hypertension, systolic congestive heart failure, DVT 2014 as well as atrial fibrillation maintained on Xarelto and hyponatremia 125-129. Presented 12/08/2013 after a fall when she slipped and fell on hardwood flooring without loss of consciousness landing on her right hip. X-rays and imaging revealed right femoral neck fracture. Preop hyponatremia 124 received intravenous fluids of normal saline of 1 L. Underwent right hip hemiarthroplasty 12/09/2013 per Dr. Mayer Camel. Weightbearing as tolerated with posterior hip cautions.Xarelto resumed for history of atrial fibrillation DVT postoperatively. Her postoperative pain control. Acute blood loss anemia 9.0 and monitored. Latest sodium level 129 and remains on fluid restriction. Physical and occupational therapy evaluations completed 12/10/2013 with recommendations for  physical medicine rehabilitation consult to consider inpatient rehabilitation services  Patient's husband who is a retired physician states that the patient has a history of SIADH.  hyponatremia  ? secondary to SIADH as patient with high urine sodium and a high urine osmolality.  SSRI has been discontinued.  Patient with no improvement in hyponatremia with normal saline.  TSH within normal limits. Cortisol level at 7.5. Chest x-ray negative.  Nephrology: patient was started on Samsca. Samsca d/c'd. Continue salt tablets, fluid restriction at 1200 cc/day, lasix 20 mg daily. Repeat cortisol level ok, Renal ff.  history of recent DVT  Xarelto resumed.  Past Medical History  Past Medical History  Diagnosis Date  . Osteoporosis   . Celiac disease   . Celiac disease   . Carotid artery occlusion     right carotid stenosis 40-60%, 4/08, neg for stenosis repeat study 11/11  . Depression   . Presbycusis   . Macular degeneration   . Urethral stricture   . Moderate aortic insufficiency   . Moderate mitral regurgitation by prior echocardiogram     echo 9/13   . Thyroid nodule     51m, no change 11/11  . RLS (restless legs syndrome)   . Vitamin D deficiency   . Heart murmur   . Pulmonary hypertension   . Hypertension  takes Metoprolol daily  . Atrial fibrillation   . Coronary artery disease   . CHF (congestive heart failure)     takes Lasix daily  . MVP (mitral valve prolapse)   . Moderate obstructive sleep apnea     uses CPAP;sleep study about 4-39month ago  . Hearing loss     both ears  . Numbness     left arm  . Joint swelling   . GERD (gastroesophageal reflux disease)     takes Omeprazole daily  . Gastric ulcer     6-871monthago  . GI bleeding   . Constipation   . Diarrhea   . Hemorrhoids   . Urinary retention   . Urinary anastomotic stricture   . Hypothyroidism     takes Synthroid daily as result of AMiodarone  . Hot flashes     takes ZOloft 3 times a week  .  Insomnia     takes Melatonin nightly  . Restless leg   . Peripheral edema     left  . DVT (deep venous thrombosis)   . Atonic bladder 12/11/2013    Family History  family history includes AAA (abdominal aortic aneurysm) in her brother; CAD in her father; CVA in her mother; Colon cancer in her father; Heart attack in her brother, father, and mother; Peripheral vascular disease in her brother.  Prior Rehab/Hospitalizations: recent rehab at [facility] after CABG 08/2013  Current Medications  Current facility-administered medications:acetaminophen (TYLENOL) suppository 650 mg, 650 mg, Rectal, Q6H PRN, HaTheressa MillardMD;  acetaminophen (TYLENOL) tablet 500 mg, 500 mg, Oral, TID, DaEugenie FillerMD, 500 mg at 12/12/13 [id number] acetaminophen (TYLENOL) tablet 650 mg, 650 mg, Oral, Q6H PRN, HaTheressa MillardMD alum & mag hydroxide-simeth (MAALOX/MYLANTA) 200-200-20 MG/5ML suspension 30 mL, 30 mL, Oral, Q6H PRN, HaTheressa MillardMD;  ciprofloxacin (CIPRO) tablet 500 mg, 500 mg, Oral, BID, Jessica U Vann, DO;  docusate sodium (COLACE) capsule 100 mg, 100 mg, Oral, BID, DaEugenie FillerMD, 100 mg at 12/12/13 0909;  furosemide (LASIX) tablet 20 mg, 20 mg, Oral, Daily, JoDonetta PottsMD, 20 mg at 12/12/13 [id number] (SYNTHROID, LEVOTHROID) tablet 50 mcg, 50 mcg, Oral, QAC breakfast, DaEugenie FillerMD, 50 mcg at 12/12/13 0909;  magnesium hydroxide (MILK OF MAGNESIA) suspension 5 mL, 5 mL, Oral, Daily PRN, DaEugenie FillerMD;  menthol-cetylpyridinium (CEPACOL) lozenge 3 mg, 1 lozenge, Oral, PRN, ErLeighton ParodyPA-C;  metoCLOPramide (REGLAN) injection 5-10 mg, 5-10 mg, Intravenous, Q8H PRN, ErLeighton ParodyPA-C metoCLOPramide (REGLAN) tablet 5-10 mg, 5-10 mg, Oral, Q8H PRN, ErLeighton ParodyPA-C;  morphine 2 MG/ML injection 1-2 mg, 1-2 mg, Intravenous, Q4H PRN, DaEugenie FillerMD;  ondansetron ([facility] Hospitalinjection 4 mg, 4 mg, Intravenous, Q6H PRN, HaTheressa MillardMD, 4  mg at 12/10/13 0951;  ondansetron (ZOFRAN) tablet 4 mg, 4 mg, Oral, Q6H PRN, HaTheressa MillardMD phenol (CHLORASEPTIC) mouth spray 1 spray, 1 spray, Mouth/Throat, PRN, ErLeighton ParodyPA-C;  polyethylene glycol (MIRALAX / GLYCOLAX) packet 17 g, 17 g, Oral, Daily, DaEugenie FillerMD, 17 g at 12/12/13 1000;  Rivaroxaban (XARELTO) tablet 20 mg, 20 mg, Oral, Q supper, ErLeighton ParodyPA-C, 20 mg at 12/11/13 1753;  senna (SENOKOT) tablet 8.6 mg, 1 tablet, Oral, Daily PRN, JeGeradine GirtDO sodium chloride tablet 2 g, 2 g, Oral, TID WC, RoSol BlazingMD, 2 g at 12/12/13 0909;  tiZANidine (ZANAFLEX) tablet 2 mg, 2 mg,  Oral, TID, Kerin Salen, MD, 2 mg at 12/12/13 0911;  traMADol Veatrice Bourbon) tablet 50 mg, 50 mg, Oral, Q6H PRN, Eugenie Filler, MD, 50 mg at 12/12/13 0909  Patients Current Diet: Gluten Restricted  Precautions / Restrictions Precautions Precautions: Posterior Hip;Fall Precaution Booklet Issued: Yes (comment) Precaution Comments: Posterior precautions posted in room; Pt and husband educated -- will need reinforcement Restrictions Weight Bearing Restrictions: Yes RLE Weight Bearing: Weight bearing as tolerated   Prior Activity Level Community (5-7x/wk): Lives in [place] at [facility] for 2 1/12 years. Goes to [employer] there about 3 times per week. Husband and staff state pt has to be encouraged to do more aggressive therapy. She will sweet talk a therapist out of a therapy session. Patient was completely independnent and driving prior to CABG 11/14.  Home Assistive Devices / Equipment Home Assistive Devices/Equipment: Environmental consultant (specify type)  Prior Functional Level Prior Function Level of Independence: Independent  Current Functional Level Cognition  Overall Cognitive Status: Within Functional Limits for tasks assessed Orientation Level: Oriented X4    Extremity Assessment (includes Sensation/Coordination)          ADLs  Eating/Feeding: Set  up;Supervision/safety Where Assessed - Eating/Feeding: Chair Grooming: Set up;Supervision/safety Where Assessed - Grooming: Supported sitting Upper Body Bathing: Supervision/safety;Set up Where Assessed - Upper Body Bathing: Supported sitting Lower Body Bathing: +1 Total assistance Where Assessed - Lower Body Bathing: Supported sit to stand Upper Body Dressing: Maximal assistance Where Assessed - Upper Body Dressing: Supported sitting Lower Body Dressing: +1 Total assistance Where Assessed - Lower Body Dressing: Supported sit to Lobbyist: +2 Total assistance Toilet Transfer: Patient Percentage: 50% Armed forces technical officer Method: Arts development officer:  (Recliner>bed going to pt's left) Toileting - Clothing Manipulation and Hygiene: +1 Total assistance (pt incontinent of urine as she stood up from recliner to go to bed) Equipment Used: Gait belt;Rolling walker Transfers/Ambulation Related to ADLs: total A +2 Mod    Mobility  Overal bed mobility: Needs Assistance;+2 for physical assistance Bed Mobility: Supine to Sit Supine to sit: +2 for physical assistance;Total assist Sit to supine: Max assist;+2 for physical assistance General bed mobility comments: Required phsyical assistance for all aspects of bed mobility; step-by-step cues for technique    Transfers  Overall transfer level: Needs assistance Equipment used: Rolling walker (2 wheeled) Transfers: Sit to/from Stand Sit to Stand: +2 safety/equipment;Mod assist General transfer comment: Cues for technqiue and post hip prec; Needing more assist against gravity for rise than last session; Max cues to pre-position RLE for post prec    Ambulation / Gait / Stairs / Wheelchair Mobility  Ambulation/Gait Ambulation Distance (Feet): 12 Feet Gait velocity: quite slow General Gait Details: Continued cues for gait sequence; Required physical assist to advance RLE; Verbal and tactile cues to activate quad and hip  extensors for better stance stability RLE    Posture / Balance Dynamic Sitting Balance Sitting balance - Comments: has a hard time coming forwar for sit>stand; tending to sit with posterior pelvic tilt; tactile cueing at sacrum to help increase ant pelvic tilt; cues also for trunk extension; Pt with difficulty scooting in bed, recliner, and to position self better on BSC due to post pelvic tile and difficulty weight shifting    Special needs/care consideration Bowel mgmt: history of constipation issues pta Bladder mgmt: foley to be replaced 12/12/13 due to voiding issues    Previous Home Environment Living Arrangements: Spouse/significant other  Lives With: Spouse Available Help at Discharge: Available  24 hours/day Type of Home: [place] Name: [facility] for 2 1/2 yrs Home Layout: One level Home Access: Level entry [employer] Shower/Tub: Multimedia programmer: Standard Bathroom Accessibility: Yes How Accessible: Accessible via walker [place]: No Additional Comments: AFter CABG 11/14 went to [facility] for recovery  Discharge Living Setting Plans for Discharge Living Setting: Other (Comment) (Independent cottage at [place] with 97 yo spouse) Type of Home at Discharge: [place] Name at Discharge: [facility] [place] Discharge Home Layout: One level Discharge Home Access: Level entry Discharge Bathroom Shower/Tub: Walk-in shower Discharge Bathroom Toilet: Standard Discharge [place] Accessibility: Yes How Accessible: Accessible via walker Does the patient have any problems obtaining your medications?: No  Social/Family/Support Systems Patient Roles: Spouse;Parent Contact Information: Dr. [place]ill[place] cell [phone] Anticipated Caregiver: spouse Anticipated Caregiver's Contact Information: as above Ability/Limitations of Caregiver: no limitations; very  independent and active Caregiver Availability: 24/7 Discharge Plan Discussed with Primary Caregiver: Yes Is Caregiver In Agreement with Plan?: Yes Does Caregiver/Family have Issues with Lodging/Transportation while Pt is in Rehab?: No (spouse stays with pt most nights while hospitalized)    Goals/Additional Needs Patient/Family Goal for Rehab: Min assist with PT and OT Expected length of stay: [facility]OS 10 to 18 days Dietary Needs: gluten free/ciliac disease Special Service Needs: constipation issues pta and voiding issues during admission Pt/Family Agrees to Admission and willing to participate: Yes Program Orientation Provided & Reviewed with Pt/Caregiver Including Roles  & Responsibilities: Yes   Decrease burden of Care through IP rehab admission: n/a  Possible need for SNF placement upon discharge:no. Spouse chose CIR for the more aggressive therapy offered here rather than for pt to receive her rehab at [facility].  Patient Condition: This patient's condition remains as documented in the consult dated 12/11/13, in which the Rehabilitation Physician determined and documented that the patient's condition is appropriate for intensive rehabilitative care in an inpatient rehabilitation facility. Will admit to inpatient rehab today.  Preadmission Screen Completed By:  Cleatrice Burke, 12/12/2013 12:09 PM ______________________________________________________________________   Discussed status with Dr. Naaman Plummer on 12/12/13 at  1209 and received telephone approval for admission today.  Admission Coordinator:  Cleatrice Burke, time 1209 Date 12/12/13.

## 2013-12-12 NOTE — Progress Notes (Signed)
Utilization review completed.  

## 2013-12-12 NOTE — Progress Notes (Signed)
Pt. Did not void overnight.  Per protocol, pt must have foley catheter placed since she was already in & out cathed x2 yesterday.  Bladder scan showed retention of 327m this morning.  Pt. Stated she feels like she could have a BM and would like to wait to have foley catheter placed until after she has a BM this morning, hoping she can void with the BM and can possibly avoid the foley catheter.

## 2013-12-12 NOTE — H&P (Signed)
Physical Medicine and Rehabilitation Admission H&P    Chief Complaint  Patient presents with  . Fall  . Hip Injury  :  Chief complaint: Hip pain  HPI: Maria [name] [name] is Maria [name] 80 y.o. right-handed female with history of CAD/CABG November 2014, hypertension, diastolic congestive heart failure, DVT 2014 as well as atrial fibrillation maintained on Xarelto and hyponatremia/SIADH 125-129. Patient independent prior to admission living with her husband who is Maria [name] retired Engineer, drilling. Presented 12/08/2013 after Maria [name] fall when she slipped and fell on hardwood flooring without loss of consciousness landing on her right hip. X-rays and imaging revealed right femoral neck fracture. Preop hyponatremia 124 received intravenous fluids of normal saline of 1 L. Underwent right hip hemiarthroplasty 12/09/2013 per Dr. Mayer Camel. Weightbearing as tolerated with posterior hip cautions.Xarelto resumed for history of atrial fibrillation DVT postoperatively. Her postoperative pain control. Acute blood loss anemia 9.4 and monitored. Followup nephrology services for history of SIADH and placed on low-dose Lasix and Latest sodium level 126- 129 and remains on fluid restriction and sodium chloride tablets. Urine study 12/08/2013 greater than 100,000 Klebsiella Escherichia coli and maintained on Rocephin changed to Cipro with followup urine study 12/11/2013 negative as well bouts of urinary retention. Physical and occupational therapy evaluations completed 12/10/2013 with recommendations for physical medicine rehabilitation consult to consider inpatient rehabilitation services . Patient was admitted for comprehensive rehabilitation program     ROS Review of Systems  HENT: Positive for hearing loss.  Respiratory: Positive for shortness of breath.  Cardiovascular: Positive for palpitations and leg swelling.  Gastrointestinal: Positive for constipation.  GERD  Neurological:  Restless legs  Psychiatric/Behavioral: Positive for  depression. The patient has insomnia.  All other systems reviewed and are negative  Past Medical History  Diagnosis Date  . Osteoporosis   . Celiac disease   . Celiac disease   . Carotid artery occlusion     right carotid stenosis 40-60%, 4/08, neg for stenosis repeat study 11/11  . Depression   . Presbycusis   . Macular degeneration   . Urethral stricture   . Moderate aortic insufficiency   . Moderate mitral regurgitation by prior echocardiogram     echo 9/13   . Thyroid nodule     51m, no change 11/11  . RLS (restless legs syndrome)   . Vitamin D deficiency   . Heart murmur   . Pulmonary hypertension   . Hypertension     takes Metoprolol daily  . Atrial fibrillation   . Coronary artery disease   . CHF (congestive heart failure)     takes Lasix daily  . MVP (mitral valve prolapse)   . Moderate obstructive sleep apnea     uses CPAP;sleep study about 4-580monthago  . Hearing loss     both ears  . Numbness     left arm  . Joint swelling   . GERD (gastroesophageal reflux disease)     takes Omeprazole daily  . Gastric ulcer     [id number]  . GI bleeding   . Constipation   . Diarrhea   . Hemorrhoids   . Urinary retention   . Urinary anastomotic stricture   . Hypothyroidism     takes Synthroid daily as result of AMiodarone  . Hot flashes     takes ZOloft 3 times Maria [name] week  . Insomnia     takes Melatonin nightly  . Restless leg   . Peripheral edema     left  . DVT (deep  venous thrombosis)   . Atonic bladder 12/11/2013   Past Surgical History  Procedure Laterality Date  . Appendectomy    . Tubal ligation    . Tonsillectomy    . Trigger thumb    . Cardiac catheterization  08-06-13  . Tcs    . Bilateral cataract surgery    . Mandible surgery    . Coronary artery bypass graft N/Maria [name] 08/28/2013    Procedure: CORONARY ARTERY BYPASS GRAFTING (CABG) x2 using right greater saphenous vein and left internal mammary artery. ;  Surgeon: Grace Isaac, MD;  Location:  [facility];  Service: Open Heart Surgery;  Laterality: N/Maria [name];  . Intraoperative transesophageal echocardiogram N/Maria [name] 08/28/2013    Procedure: INTRAOPERATIVE TRANSESOPHAGEAL ECHOCARDIOGRAM;  Surgeon: Grace Isaac, MD;  Location: [place];  Service: Open Heart Surgery;  Laterality: N/Maria [name];   Family History  Problem Relation Age of Onset  . Heart attack Mother   . CVA Mother   . CAD Father   . Colon cancer Father   . Heart attack Father   . Heart attack Brother   . Peripheral vascular disease Brother   . AAA (abdominal aortic aneurysm) Brother    Social History:  reports that she has never smoked. She has never used smokeless tobacco. She reports that she does not drink alcohol or use illicit drugs. Allergies:  Allergies  Allergen Reactions  . Amoxicillin Other (See Comments)    Nausea, dizziness  . Codeine Nausea And Vomiting  . Fosamax [Alendronate Sodium] Other (See Comments)    Jaw pain  . Gluten Meal Other (See Comments)    Celiac Disease  . Hctz [Hydrochlorothiazide] Other (See Comments)    Hyponatremia, GI upset  . Tetanus Toxoids Other (See Comments)    Bad local reaction   Medications Prior to Admission  Medication Sig Dispense Refill  . acetaminophen (TYLENOL) 325 MG tablet Take 650 mg by mouth every 6 (six) hours as needed.      . docusate sodium (COLACE) 100 MG capsule Take 100 mg by mouth daily as needed.       Marland Kitchen levothyroxine (SYNTHROID, LEVOTHROID) 50 MCG tablet Take 1 tablet (50 mcg total) by mouth daily before breakfast.  30 tablet  11  . magnesium hydroxide (MILK OF MAGNESIA) 400 MG/5ML suspension Take 5 mLs by mouth daily as needed for mild constipation.       . Melatonin 3 MG TABS Take 1 tablet by mouth at bedtime as needed (sleep).      . MULTIPLE VITAMIN PO Take 1 tablet by mouth daily.      Marland Kitchen omeprazole (PRILOSEC) 20 MG capsule Take 20 mg by mouth every other day.       . Rivaroxaban (XARELTO) 20 MG TABS tablet Take 1 tablet (20 mg total) by mouth daily with supper.  Start January 3rd.  30 tablet  5  . sertraline (ZOLOFT) 50 MG tablet Take 50 mg by mouth every other day.      . spironolactone (ALDACTONE) 25 MG tablet Take 1 tablet (25 mg total) by mouth daily.  30 tablet  11  . LORazepam (ATIVAN) 0.5 MG tablet Take 1 tablet (0.5 mg total) by mouth at bedtime.  30 tablet  0    Home: Home Living Family/patient expects to be discharged to:: Inpatient rehab Living Arrangements: Spouse/significant other   Functional History:    Functional Status:  Mobility:     Ambulation/Gait Ambulation Distance (Feet): 12 Feet Gait velocity: quite slow General Gait Details:  Continued cues for gait sequence; Required physical assist to advance RLE; Verbal and tactile cues to activate quad and hip extensors for better stance stability RLE    ADL: ADL Eating/Feeding: Set up;Supervision/safety Where Assessed - Eating/Feeding: Chair Grooming: Set up;Supervision/safety Where Assessed - Grooming: Supported sitting Upper Body Bathing: Supervision/safety;Set up Where Assessed - Upper Body Bathing: Supported sitting Lower Body Bathing: +1 Total assistance Where Assessed - Lower Body Bathing: Supported sit to stand Upper Body Dressing: Maximal assistance Where Assessed - Upper Body Dressing: Supported sitting Lower Body Dressing: +1 Total assistance Where Assessed - Lower Body Dressing: Supported sit to Lobbyist: +2 Total assistance Toilet Transfer Method: Arts development officer:  (Recliner>bed going to pt's left) Equipment Used: Gait belt;Rolling walker Transfers/Ambulation Related to ADLs: total Maria [name] +2 Mod  Cognition: Cognition Overall Cognitive Status: Within Functional Limits for tasks assessed Orientation Level: Oriented X4 Cognition Arousal/Alertness: Awake/alert Behavior During Therapy: WFL for tasks assessed/performed Overall Cognitive Status: Within Functional Limits for tasks assessed     Blood pressure 132/52, pulse 70,  temperature 97.9 F (36.6 C), temperature source Axillary, resp. rate 16, height 5' 1.5" (1.562 m), weight 44.453 kg (98 lb), SpO2 100.00%. Physical Exam Constitutional: She is oriented to person, place, and time. No distress. HENT:  Head: Normocephalic.  Eyes: EOM are normal.  Neck: Normal range of motion. Neck supple. No thyromegaly present.  Cardiovascular:  Cardiac rate controlled. No murmur. Chest incision clean/healed Respiratory: Effort normal and breath sounds normal. No respiratory distress. No rales or wheezes GI: Soft. Bowel sounds are normal. She exhibits no distension.  Neurological: She is alert and oriented to person, place, and time.  Follows commands. Somewhat distracted.UE's 4+ to 5/5 at deltoid, bicep, tricep, HI. LLE 2HF, 3- KE and 4/5 with ADF/APF. RLE trace hip (pain), 1 KE, 3+ ADF/APF. Sensation appears grossly intact. DTR's 1+ Skin:  Hip incision dry, intact, dressed and appropriately tender, 4cm blood blister on right heel which is tender to touch.  Results for orders placed during the hospital encounter of 12/07/13 (from the past 48 hour(s))  BASIC METABOLIC PANEL     Status: Abnormal   Collection Time    12/10/13 10:15 AM      Result Value Ref Range   Sodium 129 (*) 137 - 147 mEq/L   Potassium 4.3  3.7 - 5.3 mEq/L   Chloride 97  96 - 112 mEq/L   CO2 21  19 - 32 mEq/L   Glucose, Bld 108 (*) 70 - 99 mg/dL   BUN 7  6 - 23 mg/dL   Creatinine, Ser 0.57  0.50 - 1.10 mg/dL   Calcium 8.3 (*) 8.4 - 10.5 mg/dL   GFR calc non Af Amer 81 (*) >90 mL/min   GFR calc Af Amer >90  >90 mL/min   Comment: (NOTE)     The eGFR has been calculated using the CKD EPI equation.     This calculation has not been validated in all clinical situations.     eGFR's persistently <90 mL/min signify possible Chronic Kidney     Disease.  CBC     Status: Abnormal   Collection Time    12/10/13 10:15 AM      Result Value Ref Range   WBC 11.9 (*) 4.0 - 10.5 K/uL   RBC 3.70 (*) 3.87 -  5.11 MIL/uL   Hemoglobin 10.0 (*) 12.0 - 15.0 g/dL   HCT 30.3 (*) 36.0 - 46.0 %   MCV 81.9  78.0 - 100.0 fL   MCH 27.0  26.0 - 34.0 pg   MCHC 33.0  30.0 - 36.0 g/dL   RDW 16.5 (*) 11.5 - 15.5 %   Platelets 315  150 - 400 K/uL  GLUCOSE, CAPILLARY     Status: Abnormal   Collection Time    12/10/13  4:22 PM      Result Value Ref Range   Glucose-Capillary 122 (*) 70 - 99 mg/dL  TSH     Status: None   Collection Time    12/10/13  4:25 PM      Result Value Ref Range   TSH 2.175  0.350 - 4.500 uIU/mL   Comment: Performed at [facility], ROUTINE W REFLEX MICROSCOPIC     Status: Abnormal   Collection Time    12/11/13  3:53 AM      Result Value Ref Range   Color, Urine AMBER (*) YELLOW   Comment: BIOCHEMICALS MAY BE AFFECTED BY COLOR   APPearance CLEAR  CLEAR   Specific Gravity, Urine 1.018  1.005 - 1.030   pH 5.5  5.0 - 8.0   Glucose, UA NEGATIVE  NEGATIVE mg/dL   Hgb urine dipstick MODERATE (*) NEGATIVE   Bilirubin Urine NEGATIVE  NEGATIVE   Ketones, ur NEGATIVE  NEGATIVE mg/dL   Protein, ur NEGATIVE  NEGATIVE mg/dL   Urobilinogen, UA 0.2  0.0 - 1.0 mg/dL   Nitrite NEGATIVE  NEGATIVE   Leukocytes, UA NEGATIVE  NEGATIVE  URINE MICROSCOPIC-ADD ON     Status: None   Collection Time    12/11/13  3:53 AM      Result Value Ref Range   WBC, UA 0-2  <3 WBC/hpf   RBC / HPF 3-6  <3 RBC/hpf   Bacteria, UA RARE  RARE  CBC     Status: Abnormal   Collection Time    12/11/13  4:13 AM      Result Value Ref Range   WBC 12.4 (*) 4.0 - 10.5 K/uL   RBC 3.36 (*) 3.87 - 5.11 MIL/uL   Hemoglobin 9.0 (*) 12.0 - 15.0 g/dL   HCT 27.7 (*) 36.0 - 46.0 %   MCV 82.4  78.0 - 100.0 fL   MCH 26.8  26.0 - 34.0 pg   MCHC 32.5  30.0 - 36.0 g/dL   RDW 16.6 (*) 11.5 - 15.5 %   Platelets 290  150 - 400 K/uL  BASIC METABOLIC PANEL     Status: Abnormal   Collection Time    12/11/13  4:13 AM      Result Value Ref Range   Sodium 129 (*) 137 - 147 mEq/L   Potassium 4.1  3.7 - 5.3 mEq/L     Chloride 95 (*) 96 - 112 mEq/L   CO2 21  19 - 32 mEq/L   Glucose, Bld 103 (*) 70 - 99 mg/dL   BUN 11  6 - 23 mg/dL   Creatinine, Ser 0.65  0.50 - 1.10 mg/dL   Calcium 8.3 (*) 8.4 - 10.5 mg/dL   GFR calc non Af Amer 78 (*) >90 mL/min   GFR calc Af Amer 90 (*) >90 mL/min   Comment: (NOTE)     The eGFR has been calculated using the CKD EPI equation.     This calculation has not been validated in all clinical situations.     eGFR's persistently <90 mL/min signify possible Chronic Kidney     Disease.  CORTISOL  Status: None   Collection Time    12/11/13  4:13 AM      Result Value Ref Range   Cortisol, Plasma 19.9     Comment: (NOTE)     AM:  4.3 - 22.4 ug/dL     PM:  3.1 - 16.7 ug/dL     Performed at [facility]  CBC     Status: Abnormal   Collection Time    12/12/13  6:20 AM      Result Value Ref Range   WBC 10.0  4.0 - 10.5 K/uL   RBC 3.38 (*) 3.87 - 5.11 MIL/uL   Hemoglobin 9.4 (*) 12.0 - 15.0 g/dL   HCT 27.8 (*) 36.0 - 46.0 %   MCV 82.2  78.0 - 100.0 fL   MCH 27.8  26.0 - 34.0 pg   MCHC 33.8  30.0 - 36.0 g/dL   RDW 17.0 (*) 11.5 - 15.5 %   Platelets 277  150 - 400 K/uL  BASIC METABOLIC PANEL     Status: Abnormal   Collection Time    12/12/13  6:20 AM      Result Value Ref Range   Sodium 126 (*) 137 - 147 mEq/L   Potassium 4.4  3.7 - 5.3 mEq/L   Chloride 95 (*) 96 - 112 mEq/L   CO2 22  19 - 32 mEq/L   Glucose, Bld 112 (*) 70 - 99 mg/dL   BUN 15  6 - 23 mg/dL   Creatinine, Ser 0.54  0.50 - 1.10 mg/dL   Calcium 8.3 (*) 8.4 - 10.5 mg/dL   GFR calc non Af Amer 82 (*) >90 mL/min   GFR calc Af Amer >90  >90 mL/min   Comment: (NOTE)     The eGFR has been calculated using the CKD EPI equation.     This calculation has not been validated in all clinical situations.     eGFR's persistently <90 mL/min signify possible Chronic Kidney     Disease.   No results found.  Post Admission Physician Evaluation: 1. Functional deficits secondary  to right femoral neck  fx after fall s/p hip hemiarthroplasty 2/15. 2. Patient is admitted to receive collaborative, interdisciplinary care between the physiatrist, rehab nursing staff, and therapy team. 3. Patient's level of medical complexity and substantial therapy needs in context of that medical necessity cannot be provided at [name] lesser intensity of care such as [name]. 4. Patient has experienced substantial functional loss from his/her baseline which was documented above under the "Functional History" and "Functional Status" headings.  Judging by the patient's diagnosis, physical exam, and functional history, the patient has potential for functional progress which will result in measurable gains while on inpatient rehab.  These gains will be of substantial and practical use upon discharge  in facilitating mobility and self-care at the household level. 5. Physiatrist will provide 24 hour management of medical needs as well as oversight of the therapy plan/treatment and provide guidance as appropriate regarding the interaction of the two. 6. 24 hour rehab nursing will assist with bladder management, bowel management, safety, skin/wound care, disease management, medication administration, pain management and patient education  and help integrate therapy concepts, techniques,education, etc. 7. PT will assess and treat for/with: Lower extremity strength, range of motion, stamina, balance, functional mobility, safety, adaptive techniques and equipment, pain mgt, ortho precautions, family education.   Goals are: min assist. 8. OT will assess and treat for/with: ADL's, functional mobility, safety, upper extremity strength,  adaptive techniques and equipment, pain mgt, ortho precautions, family ed.   Goals are: min+ assist. 9. SLP will assess and treat for/with: n/Maria [name].  Goals are: n/Maria [name]. 10. Case Management and Social Worker will assess and treat for psychological issues and discharge planning. 11. Team conference will be held weekly to  assess progress toward goals and to determine barriers to discharge. 12. Patient will receive at least 3 hours of therapy per day at least 5 days per week. 13. ELOS: 15-20 days       14. Prognosis:  excellent   Medical Problem List and Plan: 1. Right femoral neck fracture secondary to fall. Status post hemiarthroplasty 12/09/2013 weightbearing as tolerated with posterior hip cautions 2. DVT Prophylaxis/Anticoagulation: Xarelto. Monitor for any signs of DVT 3. Pain Management: Zanaflex 2 mg 3 times Maria [name] day, Ultram 50 mg every 6 hours as  needed pain. Monitor with increased mobility 4. Neuropsych: This patient is capable of making decisions on her own behalf. 5. Acute blood loss anemia. Latest hemoglobin 9.4. Followup CBC 6. SIADH. Sodium level 126-129. 1200 cc fluid restriction as well as sodium chloride tablets. Followup chemistries. Continue low-dose Lasix as per nephrology services 7. Diastolic congestive heart failure. No signs of fluid overload. Continue low-dose diuretic 8. CAD/CABG. Followup cardiology services as needed. No chest pain or shortness of breath 9. Atrial fibrillation. Cardiac rate control. Continue xarelto 10. Hypothyroidism. Synthroid. Latest TSH level 2.175 11. Klebsiella/Escherichia coli UTI with urinary retention. Rocephin changed to by mouth Cipro 12/12/2013. Followup urine study 12/11/2013 negative. Check PVRs x3  -WE WILL NOT BE PLACING FOLEY TODAY (PT/HUSBAND IN AGREEMENT)  -UP TO TOILET, BSC TO VOID AS MUCH AS POSSIBLE  -RX CONSTIPATION--sorbitol, supp tonight 12. Skin breakdown right heel--  -elevate  -prevalon boot  Meredith Staggers, MD, [facility]   12/12/2013

## 2013-12-12 NOTE — Discharge Summary (Addendum)
Physician Discharge Summary  CAELY MEDICO ZOX:[id number] DOB: January 29, 1924 DOA: 12/07/2013  PCP: Ginette Otto, MD  Admit date: 12/07/2013 Discharge date: 12/12/2013  Time spent: 35 minutes  Recommendations for Outpatient Follow-up:  1. levaquin x 3 more days 2. If unable to void- place foley 3. Trend BMP for Na- nephro consult 4. Fluid restrict 1200  Discharge Diagnoses:  Principal Problem:   Hip fracture, right Active Problems:   Essential hypertension, benign   Paroxysmal atrial fibrillation   Chronic diastolic heart failure   Coronary atherosclerosis of native coronary artery   DVT (deep venous thrombosis)   Anticoagulation goal of INR 2.5 to 3.5   Closed right hip fracture   UTI (lower urinary tract infection)   Hyponatremia   Fall   Atonic bladder   Acute confusional state   Discharge Condition: improved  Diet recommendation: low Na  Filed Weights   12/07/13 1857  Weight: 44.453 kg (98 lb)    History of present illness:  Maria [name] is a 80 y.o. female with a history of CAD, and HTN, who presented to the [facility] ED with Complaints of constant right hip and head pain that started today after the patient slipped and fell on hardwood flooring. The patient states that the patient hit the crown of her head, specifically on the right side, on some furniture. She denies losing consciousness at the time of the fall or after the fall. She denies feeling dizzy or lightheaded at the time of the fall. She states that her headache did not start immediately after the fall. She denies neck pain, abdominal pain, arthralgias, vomiting, SOB, chest pain, and numbness or tingling in her arms or legs bilaterally. The patient's husband states that the patient has fallen three times this week spontaneously after turning or getting up too quickly. The patient's husband states that she takes Xarelto regularly and had her last dose was 36 hours ago. The patient's husband states that  the patient has a history of DVT in her right leg diagnosed 2 months ago.  In the [facility] she was evaluated and had X-rays and CT scan s performed Brain, and C-Spine which were negative for acute findings, and the X-ray study of the Right Hip revealed a minimally dislpaced subcapital fracture of the proximal femur. Dr. Turner Daniels of Orthopedics was consulted and is to see the patient this AM. Her cardiologist is Dr. Katrinka Blazing and her PCP is Dr. Charleston Poot Course:  valgus impacted right femoral neck fracture  Secondary to mechanical fall. Patient has been seen by orthopedics and s/p right hemi arthroplasty per Dr Turner Daniels 12/09/13. PT/OT eval - CIR   hyponatremia  ? secondary to SIADH as patient with high urine sodium and a high urine osmolality.  SSRI has been discontinued.  Patient with no improvement in hyponatremia with normal saline.  TSH within normal limits. Cortisol level at 7.5. Chest x-ray negative.  Nephrology: patient was started on Samsca. Samsca d/c'd. Continue salt tablets, fluid restriction at 1200 cc/day, lasix 20 mg daily. Repeat cortisol level ok, Renal ff.   anemia/prob ABLA  Likely anemia of chronic disease versus dilutional vs ABLA periop.. No overt bleeding.   history of recent DVT  Xarelto resumed.   history of coronary artery disease status post CABG/history of chronic diastolic CHF  Stable. Spironolactone on hold secondary to hyponatremia. Xarelto resumed. Lasix started per renal. Follow.   hypothyroidism  Continue Synthroid.   urinary tract infection- ecoli and kleb oxy  IV Rocephin:  change to PO cipro   Confusion  Resolved  Atonic bladder  I/O cath PRN.- may need foley placed  Underweight with Severe Malnutrition   Consultants:  Orthopedics: Dr. Turner Daniels 12/08/2013  Nephrology: Dr. Arlean Hopping 12/08/2013 Procedures:  X-ray of the right hip02/13/2015  Chest x-ray 12/07/2013  X-ray of the C-spine 12/07/2013  R hemi-hip arthroplasty per Dr Turner Daniels  12/09/13   Discharge Exam: Filed Vitals:   12/12/13 1200  BP:   Pulse:   Temp:   Resp: 16      Discharge Instructions      Discharge Orders   Future Appointments Provider Department Dept Phone   12/27/2013 4:30 PM Delight Ovens, MD [facility] [phone]   01/23/2014 2:45 PM Lesleigh Noe, MD [facility] [phone]   Future Orders Complete By Expires   Weight bearing as tolerated  As directed    Questions:     Laterality:     Extremity:         Medication List    STOP taking these medications       LORazepam 0.5 MG tablet  Commonly known as:  ATIVAN     Melatonin 3 MG Tabs     sertraline 50 MG tablet  Commonly known as:  ZOLOFT     spironolactone 25 MG tablet  Commonly known as:  ALDACTONE      TAKE these medications       acetaminophen 325 MG tablet  Commonly known as:  TYLENOL  Take 650 mg by mouth every 6 (six) hours as needed.     alum & mag hydroxide-simeth 200-200-20 MG/5ML suspension  Commonly known as:  MAALOX/MYLANTA  Take 30 mLs by mouth every 6 (six) hours as needed for indigestion or heartburn (dyspepsia).     ciprofloxacin 500 MG tablet  Commonly known as:  CIPRO  Take 1 tablet (500 mg total) by mouth 2 (two) times daily.     docusate sodium 100 MG capsule  Commonly known as:  COLACE  Take 100 mg by mouth daily as needed.     furosemide 20 MG tablet  Commonly known as:  LASIX  Take 1 tablet (20 mg total) by mouth daily.     levothyroxine 50 MCG tablet  Commonly known as:  SYNTHROID, LEVOTHROID  Take 1 tablet (50 mcg total) by mouth daily before breakfast.     magnesium hydroxide 400 MG/5ML suspension  Commonly known as:  MILK OF MAGNESIA  Take 5 mLs by mouth daily as needed for mild constipation.     MULTIPLE VITAMIN PO  Take 1 tablet by mouth daily.     omeprazole 20 MG capsule  Commonly known as:  PRILOSEC  Take 20 mg by mouth every other day.      polyethylene glycol packet  Commonly known as:  MIRALAX / GLYCOLAX  Take 17 g by mouth daily.     Rivaroxaban 20 MG Tabs tablet  Commonly known as:  XARELTO  Take 1 tablet (20 mg total) by mouth daily with supper. Start January 3rd.     sodium chloride 1 G tablet  Take 2 tablets (2 g total) by mouth 3 (three) times daily with meals.     tiZANidine 2 MG tablet  Commonly known as:  ZANAFLEX  Take 1 tablet (2 mg total) by mouth 3 (three) times daily.     traMADol 50 MG tablet  Commonly known as:  ULTRAM  Take 1 tablet (50 mg  total) by mouth every 6 (six) hours as needed for moderate pain.       Allergies  Allergen Reactions  . Amoxicillin Other (See Comments)    Nausea, dizziness  . Codeine Nausea And Vomiting  . Fosamax [Alendronate Sodium] Other (See Comments)    Jaw pain  . Gluten Meal Other (See Comments)    Celiac Disease  . Hctz [Hydrochlorothiazide] Other (See Comments)    Hyponatremia, GI upset  . Tetanus Toxoids Other (See Comments)    Bad local reaction   Follow-up Information   Follow up with Nestor Lewandowsky, MD In 2 weeks.   Specialty:  Orthopedic Surgery   Contact information:   [address] [place] [address] [phone]        The results of significant diagnostics from this hospitalization (including imaging, microbiology, ancillary and laboratory) are listed below for reference.    Significant Diagnostic Studies: Dg Chest 1 View  12/07/2013   CLINICAL DATA:  History of trauma from a fall.  EXAM: CHEST - 1 VIEW  COMPARISON:  Chest x-ray 11/29/2013.  FINDINGS: Lung volumes are normal. No consolidative airspace disease. No pleural effusions. No pneumothorax. No pulmonary nodule or mass noted. Pulmonary vasculature and the cardiomediastinal silhouette are within normal limits. Visualize bony thorax is grossly intact. Status post median sternotomy. Left atrial appendage ligation clip noted. Atherosclerosis in the thoracic aorta.  IMPRESSION: 1. No  evidence of significant acute traumatic injury to the thorax on this plain film examination. 2. Atherosclerosis. 3. Postoperative changes, as above.   Electronically Signed   By: Trudie Reed M.D.   On: 12/07/2013 19:53   Dg Chest 2 View  11/29/2013   CLINICAL DATA:  Follow-up cardiac surgery three months ago.  EXAM: CHEST  2 VIEW  COMPARISON:  DG CHEST 2 VIEW dated 10/17/2013; DG CHEST 1V PORT dated 10/04/2013; DG CHEST 2 VIEW dated 09/15/2013  FINDINGS: There is stable cardiomegaly status post CABG and atrial ligation. The bilateral pleural effusions have nearly completely resolved. The lungs are clear. There is no pneumothorax or edema. The osseous structures appear unchanged. There is a mild scoliosis.  IMPRESSION: Interval near-complete resolution of bilateral pleural effusions. No acute findings demonstrated.   Electronically Signed   By: Roxy Horseman M.D.   On: 11/29/2013 13:55   Dg Cervical Spine Complete  12/07/2013   CLINICAL DATA:  Post fall, hit head  EXAM: CERVICAL SPINE  4+ VIEWS  COMPARISON:  None.  FINDINGS: C1 to the superior endplate of T1 is imaged on the provided supine cross-table lateral radiograph.  Normal alignment of the cervical spine. No definite anterolisthesis or retrolisthesis. The bilateral facets appear aligned given obliquity. The dens is normally positioned between the lateral masses of C1.  Cervical vertebral body heights are preserved. Prevertebral soft tissues are normal.  There is moderate to severe multilevel cervical spine a DDD, likely worse at C5-C6 and C6-C7 and to a lesser extent, C3-C4 and C4-C5 with disc space height loss, endplate irregularity and sclerosis.  Atherosclerotic plaque within the bilateral carotid bulbs.  Limited visualization of lung apices demonstrates sequela of prior median sternotomy. Atherosclerotic plaque within the aortic arch.  IMPRESSION: 1. No definite acute findings. Further evaluation could be performed with cervical spine CT as  clinically indicated. 2. Moderate to severe multilevel cervical spine DDD.   Electronically Signed   By: Simonne Come M.D.   On: 12/07/2013 19:58   Dg Hip Complete Right  12/07/2013   CLINICAL DATA:  Post  fall, now with right-sided hip pain  EXAM: RIGHT HIP - COMPLETE 2+ VIEW  COMPARISON:  None.  FINDINGS: There is a minimally displaced right-sided subcapital neck femur fracture with associated minimal foreshortening. No definite intra-articular extension. No definite associated pelvic fracture. Limited visualization of the contralateral left hip is normal. Mild scoliotic curvature of the lower lumbar spine, convex to the left with associated suspected mild to moderate multilevel DDD. Regional soft tissues appear normal.  IMPRESSION: Minimally displaced right-sided subcapital femoral neck fracture.   Electronically Signed   By: Simonne Come M.D.   On: 12/07/2013 19:55   Ct Head Wo Contrast  12/07/2013   CLINICAL DATA:  Post fall, hit back of head now with hematoma, no loss of consciousness.  EXAM: CT HEAD WITHOUT CONTRAST  TECHNIQUE: Contiguous axial images were obtained from the base of the skull through the vertex without intravenous contrast.  COMPARISON:  None.  FINDINGS: There is mild likely age-appropriate atrophy with mild diffuse sulcal prominence. Scattered periventricular hypodensities compatible with microvascular ischemic disease. Given background parenchymal abnormalities, there is no CT evidence of acute superimposed large territory infarct. No definite intraparenchymal or extra-axial mass or hemorrhage. Normal size and configuration of the ventricles and basilar cisterns. No midline shift. Limited visualization of the paranasal sinuses and mastoid air cells are normal.  Regional soft tissues are normal with special attention paid to the occiput. Post bilateral cataract surgery. No displaced calvarial fracture.  IMPRESSION: Mild likely age-appropriate atrophy and microvascular ischemic disease  without acute intracranial process.   Electronically Signed   By: Simonne Come M.D.   On: 12/07/2013 19:31   Dg Pelvis Portable  12/09/2013   CLINICAL DATA:  Right hip replacement.  EXAM: PORTABLE PELVIS 1-2 VIEWS  COMPARISON:  Plain films right hip 12/07/2013.  FINDINGS: The patient has a new bipolar right hip hemiarthroplasty. Gas in the soft tissues from surgery noted. The device is located and there is no fracture.  IMPRESSION: Right hip replacement without evidence of complication.   Electronically Signed   By: Drusilla Kanner M.D.   On: 12/09/2013 19:13    Microbiology: Recent Results (from the past 240 hour(s))  URINE CULTURE     Status: None   Collection Time    12/07/13  8:55 PM      Result Value Ref Range Status   Specimen Description URINE, CATHETERIZED   Final   Special Requests NONE   Final   Culture  Setup Time     Final   Value: 12/08/2013 17:48     Performed at [facility] Count     Final   Value: >=100,000 COLONIES/ML     Performed at [facility]   Culture     Final   Value: KLEBSIELLA OXYTOCA     ESCHERICHIA COLI     Performed at [facility]   Report Status 12/11/2013 FINAL   Final   Organism ID, Bacteria KLEBSIELLA OXYTOCA   Final   Organism ID, Bacteria ESCHERICHIA COLI   Final  MRSA PCR SCREENING     Status: None   Collection Time    12/09/13  5:38 AM      Result Value Ref Range Status   MRSA by PCR NEGATIVE  NEGATIVE Final   Comment:            The GeneXpert MRSA Assay (FDA     approved for NASAL specimens     only), is one component of a  comprehensive MRSA colonization     surveillance program. It is not     intended to diagnose MRSA     infection nor to guide or     monitor treatment for     MRSA infections.     Labs: Basic Metabolic Panel:  Recent Labs Lab 12/09/13 0158  12/09/13 1132 12/09/13 1941 12/10/13 1015 12/11/13 0413 12/12/13 0620  NA 127*  < > 130* 133* 129* 129* 126*  K 4.8  --  4.5  --   4.3 4.1 4.4  CL 94*  --  97  --  97 95* 95*  CO2 21  --  21  --  21 21 22   GLUCOSE 90  --  83  --  108* 103* 112*  BUN 11  --  9  --  7 11 15   CREATININE 0.67  --  0.59  --  0.57 0.65 0.54  CALCIUM 8.8  --  8.9  --  8.3* 8.3* 8.3*  < > = values in this interval not displayed. Liver Function Tests: No results found for this basename: AST, ALT, ALKPHOS, BILITOT, PROT, ALBUMIN,  in the last 168 hours No results found for this basename: LIPASE, AMYLASE,  in the last 168 hours No results found for this basename: AMMONIA,  in the last 168 hours CBC:  Recent Labs Lab 12/07/13 2028 12/08/13 0543 12/09/13 0158 12/10/13 1015 12/11/13 0413 12/12/13 0620  WBC 10.7* 9.2 8.9 11.9* 12.4* 10.0  NEUTROABS 8.6*  --   --   --   --   --   HGB 11.1* 11.1* 10.6* 10.0* 9.0* 9.4*  HCT 32.0* 32.4* 31.7* 30.3* 27.7* 27.8*  MCV 79.8 80.2 80.7 81.9 82.4 82.2  PLT 360 373 315 315 290 277   Cardiac Enzymes: No results found for this basename: CKTOTAL, CKMB, CKMBINDEX, TROPONINI,  in the last 168 hours BNP: BNP (last 3 results)  Recent Labs  09/05/13 0810 09/12/13 0011  PROBNP 3947.0* 1314.0*   CBG:  Recent Labs Lab 12/10/13 1622  GLUCAP 122*       Signed:  Sue Fernicola  [facility] 12/12/2013, 2:30 PM

## 2013-12-12 NOTE — Progress Notes (Signed)
Clarified with Dr. Eliseo Squires that pt is medically ready to discharge to inpt rehab today. I will arrange. [phone]

## 2013-12-12 NOTE — H&P (View-Only) (Signed)
Physical Medicine and Rehabilitation Admission H&P    Chief Complaint  Patient presents with  . Fall  . Hip Injury  :  Chief complaint: Hip pain  HPI: Maria [name] is a 80 y.o. right-handed female with history of CAD/CABG November 2014, hypertension, diastolic congestive heart failure, DVT 2014 as well as atrial fibrillation maintained on Xarelto and hyponatremia/SIADH 125-129. Patient independent prior to admission living with her husband who is a retired Engineer, drilling. Presented 12/08/2013 after a fall when she slipped and fell on hardwood flooring without loss of consciousness landing on her right hip. X-rays and imaging revealed right femoral neck fracture. Preop hyponatremia 124 received intravenous fluids of normal saline of 1 L. Underwent right hip hemiarthroplasty 12/09/2013 per Dr. Mayer Camel. Weightbearing as tolerated with posterior hip cautions.Xarelto resumed for history of atrial fibrillation DVT postoperatively. Her postoperative pain control. Acute blood loss anemia 9.4 and monitored. Followup nephrology services for history of SIADH and placed on low-dose Lasix and Latest sodium level 126- 129 and remains on fluid restriction and sodium chloride tablets. Urine study 12/08/2013 greater than 100,000 Klebsiella Escherichia coli and maintained on Rocephin changed to Cipro with followup urine study 12/11/2013 negative as well bouts of urinary retention. Physical and occupational therapy evaluations completed 12/10/2013 with recommendations for physical medicine rehabilitation consult to consider inpatient rehabilitation services . Patient was admitted for comprehensive rehabilitation program     ROS Review of Systems  HENT: Positive for hearing loss.  Respiratory: Positive for shortness of breath.  Cardiovascular: Positive for palpitations and leg swelling.  Gastrointestinal: Positive for constipation.  GERD  Neurological:  Restless legs  Psychiatric/Behavioral: Positive for  depression. The patient has insomnia.  All other systems reviewed and are negative  Past Medical History  Diagnosis Date  . Osteoporosis   . Celiac disease   . Celiac disease   . Carotid artery occlusion     right carotid stenosis 40-60%, 4/08, neg for stenosis repeat study 11/11  . Depression   . Presbycusis   . Macular degeneration   . Urethral stricture   . Moderate aortic insufficiency   . Moderate mitral regurgitation by prior echocardiogram     echo 9/13   . Thyroid nodule     83m, no change 11/11  . RLS (restless legs syndrome)   . Vitamin D deficiency   . Heart murmur   . Pulmonary hypertension   . Hypertension     takes Metoprolol daily  . Atrial fibrillation   . Coronary artery disease   . CHF (congestive heart failure)     takes Lasix daily  . MVP (mitral valve prolapse)   . Moderate obstructive sleep apnea     uses CPAP;sleep study about 4-559monthago  . Hearing loss     both ears  . Numbness     left arm  . Joint swelling   . GERD (gastroesophageal reflux disease)     takes Omeprazole daily  . Gastric ulcer     [id number]  . GI bleeding   . Constipation   . Diarrhea   . Hemorrhoids   . Urinary retention   . Urinary anastomotic stricture   . Hypothyroidism     takes Synthroid daily as result of AMiodarone  . Hot flashes     takes ZOloft 3 times a week  . Insomnia     takes Melatonin nightly  . Restless leg   . Peripheral edema     left  . DVT (deep  venous thrombosis)   . Atonic bladder 12/11/2013   Past Surgical History  Procedure Laterality Date  . Appendectomy    . Tubal ligation    . Tonsillectomy    . Trigger thumb    . Cardiac catheterization  08-06-13  . Tcs    . Bilateral cataract surgery    . Mandible surgery    . Coronary artery bypass graft N/A 08/28/2013    Procedure: CORONARY ARTERY BYPASS GRAFTING (CABG) x2 using right greater saphenous vein and left internal mammary artery. ;  Surgeon: Grace Isaac, MD;  Location:  [place];  Service: Open Heart Surgery;  Laterality: N/A;  . Intraoperative transesophageal echocardiogram N/A 08/28/2013    Procedure: INTRAOPERATIVE TRANSESOPHAGEAL ECHOCARDIOGRAM;  Surgeon: Grace Isaac, MD;  Location: [place];  Service: Open Heart Surgery;  Laterality: N/A;   Family History  Problem Relation Age of Onset  . Heart attack Mother   . CVA Mother   . CAD Father   . Colon cancer Father   . Heart attack Father   . Heart attack Brother   . Peripheral vascular disease Brother   . AAA (abdominal aortic aneurysm) Brother    Social History:  reports that she has never smoked. She has never used smokeless tobacco. She reports that she does not drink alcohol or use illicit drugs. Allergies:  Allergies  Allergen Reactions  . Amoxicillin Other (See Comments)    Nausea, dizziness  . Codeine Nausea And Vomiting  . Fosamax [Alendronate Sodium] Other (See Comments)    Jaw pain  . Gluten Meal Other (See Comments)    Celiac Disease  . Hctz [Hydrochlorothiazide] Other (See Comments)    Hyponatremia, GI upset  . Tetanus Toxoids Other (See Comments)    Bad local reaction   Medications Prior to Admission  Medication Sig Dispense Refill  . acetaminophen (TYLENOL) 325 MG tablet Take 650 mg by mouth every 6 (six) hours as needed.      . docusate sodium (COLACE) 100 MG capsule Take 100 mg by mouth daily as needed.       Marland Kitchen levothyroxine (SYNTHROID, LEVOTHROID) 50 MCG tablet Take 1 tablet (50 mcg total) by mouth daily before breakfast.  30 tablet  11  . magnesium hydroxide (MILK OF MAGNESIA) 400 MG/5ML suspension Take 5 mLs by mouth daily as needed for mild constipation.       . Melatonin 3 MG TABS Take 1 tablet by mouth at bedtime as needed (sleep).      . MULTIPLE VITAMIN PO Take 1 tablet by mouth daily.      Marland Kitchen omeprazole (PRILOSEC) 20 MG capsule Take 20 mg by mouth every other day.       . Rivaroxaban (XARELTO) 20 MG TABS tablet Take 1 tablet (20 mg total) by mouth daily with supper.  Start January 3rd.  30 tablet  5  . sertraline (ZOLOFT) 50 MG tablet Take 50 mg by mouth every other day.      . spironolactone (ALDACTONE) 25 MG tablet Take 1 tablet (25 mg total) by mouth daily.  30 tablet  11  . LORazepam (ATIVAN) 0.5 MG tablet Take 1 tablet (0.5 mg total) by mouth at bedtime.  30 tablet  0    Home: Home Living Family/patient expects to be discharged to:: Inpatient rehab Living Arrangements: Spouse/significant other   Functional History:    Functional Status:  Mobility:     Ambulation/Gait Ambulation Distance (Feet): 12 Feet Gait velocity: quite slow General Gait Details:  Continued cues for gait sequence; Required physical assist to advance RLE; Verbal and tactile cues to activate quad and hip extensors for better stance stability RLE    ADL: ADL Eating/Feeding: Set up;Supervision/safety Where Assessed - Eating/Feeding: Chair Grooming: Set up;Supervision/safety Where Assessed - Grooming: Supported sitting Upper Body Bathing: Supervision/safety;Set up Where Assessed - Upper Body Bathing: Supported sitting Lower Body Bathing: +1 Total assistance Where Assessed - Lower Body Bathing: Supported sit to stand Upper Body Dressing: Maximal assistance Where Assessed - Upper Body Dressing: Supported sitting Lower Body Dressing: +1 Total assistance Where Assessed - Lower Body Dressing: Supported sit to Lobbyist: +2 Total assistance Toilet Transfer Method: Arts development officer:  (Recliner>bed going to pt's left) Equipment Used: Gait belt;Rolling walker Transfers/Ambulation Related to ADLs: total A +2 Mod  Cognition: Cognition Overall Cognitive Status: Within Functional Limits for tasks assessed Orientation Level: Oriented X4 Cognition Arousal/Alertness: Awake/alert Behavior During Therapy: WFL for tasks assessed/performed Overall Cognitive Status: Within Functional Limits for tasks assessed     Blood pressure 132/52, pulse 70,  temperature 97.9 F (36.6 C), temperature source Axillary, resp. rate 16, height 5' 1.5" (1.562 m), weight 44.453 kg (98 lb), SpO2 100.00%. Physical Exam Constitutional: She is oriented to person, place, and time. No distress. HENT:  Head: Normocephalic.  Eyes: EOM are normal.  Neck: Normal range of motion. Neck supple. No thyromegaly present.  Cardiovascular:  Cardiac rate controlled. No murmur. Chest incision clean/healed Respiratory: Effort normal and breath sounds normal. No respiratory distress. No rales or wheezes GI: Soft. Bowel sounds are normal. She exhibits no distension.  Neurological: She is alert and oriented to person, place, and time.  Follows commands. Somewhat distracted.UE's 4+ to 5/5 at deltoid, bicep, tricep, HI. LLE 2HF, 3- KE and 4/5 with ADF/APF. RLE trace hip (pain), 1 KE, 3+ ADF/APF. Sensation appears grossly intact. DTR's 1+ Skin:  Hip incision dry, intact, dressed and appropriately tender, 4cm blood blister on right heel which is tender to touch.  Results for orders placed during the hospital encounter of 12/07/13 (from the past 48 hour(s))  BASIC METABOLIC PANEL     Status: Abnormal   Collection Time    12/10/13 10:15 AM      Result Value Ref Range   Sodium 129 (*) 137 - 147 mEq/L   Potassium 4.3  3.7 - 5.3 mEq/L   Chloride 97  96 - 112 mEq/L   CO2 21  19 - 32 mEq/L   Glucose, Bld 108 (*) 70 - 99 mg/dL   BUN 7  6 - 23 mg/dL   Creatinine, Ser 0.57  0.50 - 1.10 mg/dL   Calcium 8.3 (*) 8.4 - 10.5 mg/dL   GFR calc non Af Amer 81 (*) >90 mL/min   GFR calc Af Amer >90  >90 mL/min   Comment: (NOTE)     The eGFR has been calculated using the CKD EPI equation.     This calculation has not been validated in all clinical situations.     eGFR's persistently <90 mL/min signify possible Chronic Kidney     Disease.  CBC     Status: Abnormal   Collection Time    12/10/13 10:15 AM      Result Value Ref Range   WBC 11.9 (*) 4.0 - 10.5 K/uL   RBC 3.70 (*) 3.87 -  5.11 MIL/uL   Hemoglobin 10.0 (*) 12.0 - 15.0 g/dL   HCT 30.3 (*) 36.0 - 46.0 %   MCV 81.9  78.0 - 100.0 fL   MCH 27.0  26.0 - 34.0 pg   MCHC 33.0  30.0 - 36.0 g/dL   RDW 16.5 (*) 11.5 - 15.5 %   Platelets 315  150 - 400 K/uL  GLUCOSE, CAPILLARY     Status: Abnormal   Collection Time    12/10/13  4:22 PM      Result Value Ref Range   Glucose-Capillary 122 (*) 70 - 99 mg/dL  TSH     Status: None   Collection Time    12/10/13  4:25 PM      Result Value Ref Range   TSH 2.175  0.350 - 4.500 uIU/mL   Comment: Performed at [facility], ROUTINE W REFLEX MICROSCOPIC     Status: Abnormal   Collection Time    12/11/13  3:53 AM      Result Value Ref Range   Color, Urine AMBER (*) YELLOW   Comment: BIOCHEMICALS MAY BE AFFECTED BY COLOR   APPearance CLEAR  CLEAR   Specific Gravity, Urine 1.018  1.005 - 1.030   pH 5.5  5.0 - 8.0   Glucose, UA NEGATIVE  NEGATIVE mg/dL   Hgb urine dipstick MODERATE (*) NEGATIVE   Bilirubin Urine NEGATIVE  NEGATIVE   Ketones, ur NEGATIVE  NEGATIVE mg/dL   Protein, ur NEGATIVE  NEGATIVE mg/dL   Urobilinogen, UA 0.2  0.0 - 1.0 mg/dL   Nitrite NEGATIVE  NEGATIVE   Leukocytes, UA NEGATIVE  NEGATIVE  URINE MICROSCOPIC-ADD ON     Status: None   Collection Time    12/11/13  3:53 AM      Result Value Ref Range   WBC, UA 0-2  <3 WBC/hpf   RBC / HPF 3-6  <3 RBC/hpf   Bacteria, UA RARE  RARE  CBC     Status: Abnormal   Collection Time    12/11/13  4:13 AM      Result Value Ref Range   WBC 12.4 (*) 4.0 - 10.5 K/uL   RBC 3.36 (*) 3.87 - 5.11 MIL/uL   Hemoglobin 9.0 (*) 12.0 - 15.0 g/dL   HCT 27.7 (*) 36.0 - 46.0 %   MCV 82.4  78.0 - 100.0 fL   MCH 26.8  26.0 - 34.0 pg   MCHC 32.5  30.0 - 36.0 g/dL   RDW 16.6 (*) 11.5 - 15.5 %   Platelets 290  150 - 400 K/uL  BASIC METABOLIC PANEL     Status: Abnormal   Collection Time    12/11/13  4:13 AM      Result Value Ref Range   Sodium 129 (*) 137 - 147 mEq/L   Potassium 4.1  3.7 - 5.3 mEq/L     Chloride 95 (*) 96 - 112 mEq/L   CO2 21  19 - 32 mEq/L   Glucose, Bld 103 (*) 70 - 99 mg/dL   BUN 11  6 - 23 mg/dL   Creatinine, Ser 0.65  0.50 - 1.10 mg/dL   Calcium 8.3 (*) 8.4 - 10.5 mg/dL   GFR calc non Af Amer 78 (*) >90 mL/min   GFR calc Af Amer 90 (*) >90 mL/min   Comment: (NOTE)     The eGFR has been calculated using the CKD EPI equation.     This calculation has not been validated in all clinical situations.     eGFR's persistently <90 mL/min signify possible Chronic Kidney     Disease.  CORTISOL  Status: None   Collection Time    12/11/13  4:13 AM      Result Value Ref Range   Cortisol, Plasma 19.9     Comment: (NOTE)     AM:  4.3 - 22.4 ug/dL     PM:  3.1 - 16.7 ug/dL     Performed at [facility]  CBC     Status: Abnormal   Collection Time    12/12/13  6:20 AM      Result Value Ref Range   WBC 10.0  4.0 - 10.5 K/uL   RBC 3.38 (*) 3.87 - 5.11 MIL/uL   Hemoglobin 9.4 (*) 12.0 - 15.0 g/dL   HCT 27.8 (*) 36.0 - 46.0 %   MCV 82.2  78.0 - 100.0 fL   MCH 27.8  26.0 - 34.0 pg   MCHC 33.8  30.0 - 36.0 g/dL   RDW 17.0 (*) 11.5 - 15.5 %   Platelets 277  150 - 400 K/uL  BASIC METABOLIC PANEL     Status: Abnormal   Collection Time    12/12/13  6:20 AM      Result Value Ref Range   Sodium 126 (*) 137 - 147 mEq/L   Potassium 4.4  3.7 - 5.3 mEq/L   Chloride 95 (*) 96 - 112 mEq/L   CO2 22  19 - 32 mEq/L   Glucose, Bld 112 (*) 70 - 99 mg/dL   BUN 15  6 - 23 mg/dL   Creatinine, Ser 0.54  0.50 - 1.10 mg/dL   Calcium 8.3 (*) 8.4 - 10.5 mg/dL   GFR calc non Af Amer 82 (*) >90 mL/min   GFR calc Af Amer >90  >90 mL/min   Comment: (NOTE)     The eGFR has been calculated using the CKD EPI equation.     This calculation has not been validated in all clinical situations.     eGFR's persistently <90 mL/min signify possible Chronic Kidney     Disease.   No results found.  Post Admission Physician Evaluation: 1. Functional deficits secondary  to right femoral neck  fx after fall s/p hip hemiarthroplasty 2/15. 2. Patient is admitted to receive collaborative, interdisciplinary care between the physiatrist, rehab nursing staff, and therapy team. 3. Patient's level of medical complexity and substantial therapy needs in context of that medical necessity cannot be provided at a lesser intensity of care such as a SNF. 4. Patient has experienced substantial functional loss from his/her baseline which was documented above under the "Functional History" and "Functional Status" headings.  Judging by the patient's diagnosis, physical exam, and functional history, the patient has potential for functional progress which will result in measurable gains while on inpatient rehab.  These gains will be of substantial and practical use upon discharge  in facilitating mobility and self-care at the household level. 5. Physiatrist will provide 24 hour management of medical needs as well as oversight of the therapy plan/treatment and provide guidance as appropriate regarding the interaction of the two. 6. 24 hour rehab nursing will assist with bladder management, bowel management, safety, skin/wound care, disease management, medication administration, pain management and patient education  and help integrate therapy concepts, techniques,education, etc. 7. PT will assess and treat for/with: Lower extremity strength, range of motion, stamina, balance, functional mobility, safety, adaptive techniques and equipment, pain mgt, ortho precautions, family education.   Goals are: min assist. 8. OT will assess and treat for/with: ADL's, functional mobility, safety, upper extremity strength,  adaptive techniques and equipment, pain mgt, ortho precautions, family ed.   Goals are: min+ assist. 9. SLP will assess and treat for/with: n/a.  Goals are: n/a. 10. Case Management and Social Worker will assess and treat for psychological issues and discharge planning. 11. Team conference will be held weekly to  assess progress toward goals and to determine barriers to discharge. 12. Patient will receive at least 3 hours of therapy per day at least 5 days per week. 13. ELOS: 15-20 days       14. Prognosis:  excellent   Medical Problem List and Plan: 1. Right femoral neck fracture secondary to fall. Status post hemiarthroplasty 12/09/2013 weightbearing as tolerated with posterior hip cautions 2. DVT Prophylaxis/Anticoagulation: Xarelto. Monitor for any signs of DVT 3. Pain Management: Zanaflex 2 mg 3 times a day, Ultram 50 mg every 6 hours as  needed pain. Monitor with increased mobility 4. Neuropsych: This patient is capable of making decisions on her own behalf. 5. Acute blood loss anemia. Latest hemoglobin 9.4. Followup CBC 6. SIADH. Sodium level 126-129. 1200 cc fluid restriction as well as sodium chloride tablets. Followup chemistries. Continue low-dose Lasix as per nephrology services 7. Diastolic congestive heart failure. No signs of fluid overload. Continue low-dose diuretic 8. CAD/CABG. Followup cardiology services as needed. No chest pain or shortness of breath 9. Atrial fibrillation. Cardiac rate control. Continue xarelto 10. Hypothyroidism. Synthroid. Latest TSH level 2.175 11. Klebsiella/Escherichia coli UTI with urinary retention. Rocephin changed to by mouth Cipro 12/12/2013. Followup urine study 12/11/2013 negative. Check PVRs x3  -WE WILL NOT BE PLACING FOLEY TODAY (PT/HUSBAND IN AGREEMENT)  -UP TO TOILET, BSC TO VOID AS MUCH AS POSSIBLE  -RX CONSTIPATION--sorbitol, supp tonight 12. Skin breakdown right heel--  -elevate  -prevalon boot  Meredith Staggers, MD, [facility]   12/12/2013

## 2013-12-12 NOTE — Telephone Encounter (Signed)
Message copied by Lamar Laundry on Wed Dec 12, 2013  9:33 AM ------      Message from: Daneen Schick      Created: Thu Dec 06, 2013  5:46 PM       Decrease the spironolactone to 12.5 mg daily. Patient notifed. Please update med list ------

## 2013-12-12 NOTE — Interval H&P Note (Signed)
Maria [name] was admitted today to Inpatient Rehabilitation with the diagnosis of right femoral neck fx.  The patient's history has been reviewed, patient examined, and there is no change in status.  Patient continues to be appropriate for intensive inpatient rehabilitation.  I have reviewed the patient's chart and labs.  Questions were answered to the patient's satisfaction.  [name] T 12/12/2013, 8:19 PM

## 2013-12-12 NOTE — Progress Notes (Signed)
PATIENT ID: Maria [name]  MRN: [id number]  DOB/AGE:  11/12/1923 / 80 y.o.  3 Days Post-Op Procedure(s) (LRB): ARTHROPLASTY BIPOLAR HIP CEMENTED (Right)    PROGRESS NOTE Subjective: Patient is alert, oriented,no Nausea, no Vomiting, yes passing gas, no Bowel Movement. Taking PO well. Denies SOB, Chest or Calf Pain. Using Incentive Spirometer, PAS in place. Ambulate WBAT Patient reports pain as 3 on 0-10 scale  .    Objective: Vital signs in last 24 hours: Filed Vitals:   12/11/13 2043 12/12/13 0000 12/12/13 0400 12/12/13 0454  BP: 139/56   132/52  Pulse: 77   70  Temp: 97.3 F (36.3 C)   97.9 F (36.6 C)  TempSrc:    Axillary  Resp: 16 16 16 16   Height:      Weight:      SpO2: 99%   100%      Intake/Output from previous day: I/O last 3 completed shifts: In: 700 [P.O.:450; IV Piggyback:250] Out: 4680 [Urine:1325]   Intake/Output this shift:     LABORATORY DATA:  Recent Labs  12/10/13 1622 12/11/13 0413 12/12/13 0620  WBC  --  12.4* 10.0  HGB  --  9.0* 9.4*  HCT  --  27.7* 27.8*  PLT  --  290 277  NA  --  129* 126*  K  --  4.1 4.4  CL  --  95* 95*  CO2  --  21 22  BUN  --  11 15  CREATININE  --  0.65 0.54  GLUCOSE  --  103* 112*  GLUCAP 122*  --   --   CALCIUM  --  8.3* 8.3*    Examination: Neurologically intact Neurovascular intact Sensation intact distally Intact pulses distally Dorsiflexion/Plantar flexion intact Incision: dressing C/D/I No cellulitis present Compartment soft} XR AP&Lat of hip shows well placed\fixed THA  Assessment:   3 Days Post-Op Procedure(s) (LRB): ARTHROPLASTY BIPOLAR HIP CEMENTED (Right) ADDITIONAL DIAGNOSIS:  Hypertension, Cardiac Arrythmia Atrial fibrillation and Depressive disorder with mild dementia, history of mitral regurgitation, pulmonary hypertension  Hyponatremia - followed by medicine  Plan: PT/OT WBAT, THA  posterior precautions  DVT Prophylaxis: SCDx72 hrs, Pt has restarted xeralto  DISCHARGE PLAN:  Inpatient Rehab when pt cleared by medicine  DISCHARGE NEEDS: HHPT, HHRN, Walker and 3-in-1 comode seat

## 2013-12-12 NOTE — Progress Notes (Signed)
Patient ID: Maria [name], female   DOB: [birth year], 80 y.o.   MRN: [id number] S:complaining of constipation and abd distention  O:BP 132/52  Pulse 70  Temp(Src) 97.9 F (36.6 C) (Axillary)  Resp 16  Ht 5' 1.5" (1.562 m)  Wt 44.453 kg (98 lb)  BMI 18.22 kg/m2  SpO2 100%  Intake/Output Summary (Last 24 hours) at 12/12/13 1329 Last data filed at 12/12/13 1302  Gross per 24 hour  Intake    820 ml  Output    325 ml  Net    495 ml   Intake/Output: I/O last 3 completed shifts: In: 700 [P.O.:450; IV Piggyback:250] Out: 1325 [Urine:1325]  Intake/Output this shift:  Total I/O In: 240 [P.O.:240] Out: -  Weight change:  RAQ:TMAUQ, elderly WF in nad CVS:no rub Resp:cta JFH:LKTGYB Ext:no edema   Recent Labs Lab 12/08/13 0543 12/08/13 1405  12/09/13 0158 12/09/13 0900 12/09/13 1132 12/09/13 1941 12/10/13 1015 12/11/13 0413 12/12/13 0620  NA 125* 121*  < > 127* 131* 130* 133* 129* 129* 126*  K 4.8 4.6  --  4.8  --  4.5  --  4.3 4.1 4.4  CL 92* 90*  --  94*  --  97  --  97 95* 95*  CO2 21 19  --  21  --  21  --  21 21 22   GLUCOSE 93 82  --  90  --  83  --  108* 103* 112*  BUN 10 11  --  11  --  9  --  7 11 15   CREATININE 0.59 0.63  --  0.67  --  0.59  --  0.57 0.65 0.54  CALCIUM 8.9 8.4  --  8.8  --  8.9  --  8.3* 8.3* 8.3*  < > = values in this interval not displayed. Liver Function Tests: No results found for this basename: AST, ALT, ALKPHOS, BILITOT, PROT, ALBUMIN,  in the last 168 hours No results found for this basename: LIPASE, AMYLASE,  in the last 168 hours No results found for this basename: AMMONIA,  in the last 168 hours CBC:  Recent Labs Lab 12/07/13 2028 12/08/13 0543 12/09/13 0158 12/10/13 1015 12/11/13 0413 12/12/13 0620  WBC 10.7* 9.2 8.9 11.9* 12.4* 10.0  NEUTROABS 8.6*  --   --   --   --   --   HGB 11.1* 11.1* 10.6* 10.0* 9.0* 9.4*  HCT 32.0* 32.4* 31.7* 30.3* 27.7* 27.8*  MCV 79.8 80.2 80.7 81.9 82.4 82.2  PLT 360 373 315 315 290 277    Cardiac Enzymes: No results found for this basename: CKTOTAL, CKMB, CKMBINDEX, TROPONINI,  in the last 168 hours CBG:  Recent Labs Lab 12/10/13 1622  GLUCAP 122*    Iron Studies: No results found for this basename: IRON, TIBC, TRANSFERRIN, FERRITIN,  in the last 72 hours Studies/Results: No results found. Marland Kitchen acetaminophen  500 mg Oral TID  . ciprofloxacin  500 mg Oral BID  . docusate sodium  100 mg Oral BID  . furosemide  20 mg Oral Daily  . levothyroxine  50 mcg Oral QAC breakfast  . polyethylene glycol  17 g Oral Daily  . Rivaroxaban  20 mg Oral Q supper  . sodium chloride  2 g Oral TID WC  . tiZANidine  2 mg Oral TID    BMET    Component Value Date/Time   NA 126* 12/12/2013 0620   K 4.4 12/12/2013 0620   CL 95* 12/12/2013 6389  CO2 22 12/12/2013 0620   GLUCOSE 112* 12/12/2013 0620   BUN 15 12/12/2013 0620   CREATININE 0.54 12/12/2013 0620   CALCIUM 8.3* 12/12/2013 0620   GFRNONAA 82* 12/12/2013 0620   GFRAA >90 12/12/2013 0620   CBC    Component Value Date/Time   WBC 10.0 12/12/2013 0620   RBC 3.38* 12/12/2013 0620   HGB 9.4* 12/12/2013 0620   HCT 27.8* 12/12/2013 0620   PLT 277 12/12/2013 0620   MCV 82.2 12/12/2013 0620   MCH 27.8 12/12/2013 0620   MCHC 33.8 12/12/2013 0620   RDW 17.0* 12/12/2013 0620   LYMPHSABS 1.5 12/07/2013 2028   MONOABS 0.5 12/07/2013 2028   EOSABS 0.0 12/07/2013 2028   BASOSABS 0.0 12/07/2013 2028     Assessment/Plan:  1. Hyponatremia- likely SIADH (possibly due to SSRI) but also on DDx would be reset osmostat and/or adrenal insufficiency (low cortisol of 7.5). Sodium had improved with samsca and now drop in Na with salt tabs and free water restriction  1. Slight drop in Na after adding lasix 71m po daily but asymptomatic 2. Cont with fluid restriction and salt tabs and follow Na closely as we may need to restart samsca 3. Stressed the importance of increasing her protein intake 4. Will be able to continue to monitor labs once transferred to  CIR 2. R hip fx- s/p THA on 12/09/13 3. CHF (diastolic)- euvolemic 4. Constipation- ok to give MOM as she reports this has helped in the past 5. Urinary retention- cont with prn intermittent I&O caths 6. HTN- low, holding meds. 7. CAD s/p CABG 11/14 8. LLE DVT 12/14 9. Hypothyroidism- TSH 2.175 10. ABLA- follow h/h 11. Dispo- being transferred to CIR 12.   [place]

## 2013-12-12 NOTE — Progress Notes (Signed)
PROGRESS NOTE   Maria [name] TUU:[id number] DOB: [birth year] DOA: 12/07/2013 PCP: Mathews Argyle, MD  Assessment/Plan: valgus impacted right femoral neck fracture Secondary to mechanical fall. Patient has been seen by orthopedics and s/p right hemi arthroplasty per Dr Mayer Camel 12/09/13. PT/OT eval - CIR  hyponatremia ? secondary to SIADH as patient with high urine sodium and a high urine osmolality.  SSRI has been discontinued. Patient with no improvement in hyponatremia with normal saline. T SH within normal limits. Cortisol level at 7.5. Chest x-ray negative. S Nephrology: patient was started on Samsca. Samsca d/c'd. Continue salt tablets, fluid restriction at 1200 cc/day, lasix 20 mg daily. Repeat cortisol level ok,  Renal ff.  anemia/prob ABLA Likely anemia of chronic disease versus dilutional vs ABLA periop..  No overt bleeding. Anemia panel. Follow H&H.  history of recent DVT Xarelto resumed.  history of coronary artery disease status post CABG/history of chronic diastolic CHF Stable. Spironolactone on hold secondary to hyponatremia.  Xarelto resumed. Lasix started per renal. Follow.  hypothyroidism Continue Synthroid.  urinary tract infection- ecoli and kleb oxy Continue IV Rocephin until patient ready to be d/c'd then change to oral abx.  Confusion Maybe secondary to UTI. Will d/c narcotic pain meds. Follow.  Atonic bladder I/O cath PRN.  prophylaxis On xarelto for DVT prophylaxis.  Code Status: Full Family Communication: Updated patient Disposition Plan: CIR vs SNF   Consultants:  Orthopedics: Dr. Mayer Camel 12/08/2013  Nephrology: Dr. Jonnie Finner 12/08/2013  Procedures:  X-ray of the right hip 12/07/2013  Chest x-ray 12/07/2013  X-ray of the C-spine 12/07/2013  R hemi-hip arthroplasty per Dr Mayer Camel 12/09/13  Antibiotics:  IV Rocephin 12/08/2013  HPI/Subjective:   per husband with hx of atonic bladder.  No BM yet  Objective: Filed Vitals:     12/12/13 0454  BP: 132/52  Pulse: 70  Temp: 97.9 F (36.6 C)  Resp: 16    Intake/Output Summary (Last 24 hours) at 12/12/13 0753 Last data filed at 12/12/13 0459  Gross per 24 hour  Intake    580 ml  Output    325 ml  Net    255 ml   Filed Weights   12/07/13 1857  Weight: 44.453 kg (98 lb)    Exam:   General:  nad  Cardiovascular: RRR  Respiratory: CTAB anterior lung fields  Abdomen: Soft/NT/ND/+BS  Musculoskeletal: Right hip pain. No c/c/e  Data Reviewed: Basic Metabolic Panel:  Recent Labs Lab 12/09/13 0158  12/09/13 1132 12/09/13 1941 12/10/13 1015 12/11/13 0413 12/12/13 0620  NA 127*  < > 130* 133* 129* 129* 126*  K 4.8  --  4.5  --  4.3 4.1 4.4  CL 94*  --  97  --  97 95* 95*  CO2 21  --  21  --  21 21 22   GLUCOSE 90  --  83  --  108* 103* 112*  BUN 11  --  9  --  7 11 15   CREATININE 0.67  --  0.59  --  0.57 0.65 0.54  CALCIUM 8.8  --  8.9  --  8.3* 8.3* 8.3*  < > = values in this interval not displayed. Liver Function Tests: No results found for this basename: AST, ALT, ALKPHOS, BILITOT, PROT, ALBUMIN,  in the last 168 hours No results found for this basename: LIPASE, AMYLASE,  in the last 168 hours No results found for this basename: AMMONIA,  in the last 168 hours CBC:  Recent Labs Lab 12/07/13 2028  12/08/13 0543 12/09/13 0158 12/10/13 1015 12/11/13 0413 12/12/13 0620  WBC 10.7* 9.2 8.9 11.9* 12.4* 10.0  NEUTROABS 8.6*  --   --   --   --   --   HGB 11.1* 11.1* 10.6* 10.0* 9.0* 9.4*  HCT 32.0* 32.4* 31.7* 30.3* 27.7* 27.8*  MCV 79.8 80.2 80.7 81.9 82.4 82.2  PLT 360 373 315 315 290 277   Cardiac Enzymes: No results found for this basename: CKTOTAL, CKMB, CKMBINDEX, TROPONINI,  in the last 168 hours BNP (last 3 results)  Recent Labs  09/05/13 0810 09/12/13 0011  PROBNP 3947.0* 1314.0*   CBG:  Recent Labs Lab 12/10/13 1622  GLUCAP 122*    Recent Results (from the past 240 hour(s))  URINE CULTURE     Status: None    Collection Time    12/07/13  8:55 PM      Result Value Ref Range Status   Specimen Description URINE, CATHETERIZED   Final   Special Requests NONE   Final   Culture  Setup Time     Final   Value: 12/08/2013 17:48     Performed at [place]     Final   Value: >=100,000 COLONIES/ML     Performed at [facility]   Culture     Final   Value: KLEBSIELLA OXYTOCA     ESCHERICHIA COLI     Performed at [facility]   Report Status 12/11/2013 FINAL   Final   Organism ID, Bacteria KLEBSIELLA OXYTOCA   Final   Organism ID, Bacteria ESCHERICHIA COLI   Final  MRSA PCR SCREENING     Status: None   Collection Time    12/09/13  5:38 AM      Result Value Ref Range Status   MRSA by PCR NEGATIVE  NEGATIVE Final   Comment:            The GeneXpert MRSA Assay (FDA     approved for NASAL specimens     only), is one component of a     comprehensive MRSA colonization     surveillance program. It is not     intended to diagnose MRSA     infection nor to guide or     monitor treatment for     MRSA infections.     Studies: No results found.  Scheduled Meds: . acetaminophen  500 mg Oral TID  . cefTRIAXone (ROCEPHIN)  IV  1 g Intravenous Q24H  . docusate sodium  100 mg Oral BID  . furosemide  20 mg Oral Daily  . levothyroxine  50 mcg Oral QAC breakfast  . polyethylene glycol  17 g Oral Daily  . Rivaroxaban  20 mg Oral Q supper  . sodium chloride  2 g Oral TID WC  . tiZANidine  2 mg Oral TID   Continuous Infusions:    Principal Problem:   Hip fracture, right Active Problems:   Essential hypertension, benign   Paroxysmal atrial fibrillation   Chronic diastolic heart failure   Coronary atherosclerosis of native coronary artery   DVT (deep venous thrombosis)   Anticoagulation goal of INR 2.5 to 3.5   Closed right hip fracture   UTI (lower urinary tract infection)   Hyponatremia   Fall   Atonic bladder   Acute confusional state    Time  spent: [place], [place] DO [facility] Pager [phone] If 7PM-7AM, please contact night-coverage at  [email or web address], password [facility] 12/12/2013, 7:53 AM  LOS: 5 days

## 2013-12-12 NOTE — Progress Notes (Signed)
Physical Therapy Treatment Patient Details Name: Maria [name] MRN: [id number] DOB: November 17, 1923 Today's Date: 12/12/2013 Time: 5027-7412 PT Time Calculation (min): 42 min  PT Assessment / Plan / Recommendation  History of Present Illness Admitted with fall resulting in supcapital R hip fx; now s/p THA posterior prec WBAt   PT Comments   Continuing progress with increased amb distance; pt hopeful for dc to CIR today; anticipate good progress in postacute rehab  Follow Up Recommendations  CIR     Does the patient have the potential to tolerate intense rehabilitation     Barriers to Discharge        Equipment Recommendations  Rolling walker with 5" wheels;3in1 (PT)    Recommendations for Other Services OT consult;Rehab consult  Frequency 7X/week   Progress towards PT Goals Progress towards PT goals: Progressing toward goals  Plan Current plan remains appropriate    Precautions / Restrictions Precautions Precautions: Posterior Hip;Fall Precaution Booklet Issued: Yes (comment) Precaution Comments: Posterior precautions posted in room; Pt and husband educated -- will need reinforcement Restrictions Weight Bearing Restrictions: Yes RLE Weight Bearing: Weight bearing as tolerated   Pertinent Vitals/Pain Pain hip, did note rate patient repositioned for as much  comfort as possible on [facility]    Mobility  Transfers Overall transfer level: Needs assistance Equipment used: Rolling walker (2 wheeled) Transfers: Sit to/from Stand Sit to Stand: +2 safety/equipment;Mod assist;Min assist General transfer comment: Cues for technqiue and post hip prec; Verbal and tactile cues also to initiate transfer with anterior lean, anterior pelvic tilt; she tends to rise with a posterior lean, then requires heavy mod assist until she can translate her center of mass over her base of suport; Max cues to pre-position RLE for post prec Ambulation/Gait Ambulation/Gait assistance: +2 safety/equipment;Min  assist;Mod assist Ambulation Distance (Feet): 30 Feet Assistive device: Rolling walker (2 wheeled) Gait Pattern/deviations: Step-to pattern;Trunk flexed Gait velocity: quite slow General Gait Details: Continued cues for gait sequence; Requiring less physical assist to advance RLE; Verbal and tactile cues to activate quad and hip extensors for better stance stability RLE    Exercises     PT Diagnosis:    PT Problem List:   PT Treatment Interventions:     PT Goals (current goals can now be found in the care plan section) Acute Rehab PT Goals Patient Stated Goal: did not state PT Goal Formulation: With patient Time For Goal Achievement: 12/17/13 Potential to Achieve Goals: Good  Visit Information  Last PT Received On: 12/12/13 Assistance Needed: +2 History of Present Illness: Admitted with fall resulting in supcapital R hip fx; now s/p THA posterior prec WBAt    Subjective Data  Subjective: Hesitant about walking; continued concern over lack of voiding Patient Stated Goal: did not state   Cognition  Cognition Arousal/Alertness: Awake/alert Behavior During Therapy: WFL for tasks assessed/performed Overall Cognitive Status: Within Functional Limits for tasks assessed    Balance     End of Session PT - End of Session Activity Tolerance: Patient tolerated treatment well Patient left: with call bell/phone within reach;with family/visitor present (On commode in bathroom; Nurse tech aware) Nurse Communication: Mobility status   GP     Maria [name], [place]  12/12/2013, 2:23 PM

## 2013-12-13 ENCOUNTER — Inpatient Hospital Stay (HOSPITAL_COMMUNITY): Payer: Medicare Other

## 2013-12-13 ENCOUNTER — Inpatient Hospital Stay (HOSPITAL_COMMUNITY): Payer: Medicare Other | Admitting: Physical Therapy

## 2013-12-13 ENCOUNTER — Encounter: Payer: Self-pay | Admitting: Interventional Cardiology

## 2013-12-13 DIAGNOSIS — W010XXA Fall on same level from slipping, tripping and stumbling without subsequent striking against object, initial encounter: Secondary | ICD-10-CM

## 2013-12-13 DIAGNOSIS — I4891 Unspecified atrial fibrillation: Secondary | ICD-10-CM

## 2013-12-13 DIAGNOSIS — S72009A Fracture of unspecified part of neck of unspecified femur, initial encounter for closed fracture: Secondary | ICD-10-CM

## 2013-12-13 DIAGNOSIS — D62 Acute posthemorrhagic anemia: Secondary | ICD-10-CM

## 2013-12-13 DIAGNOSIS — I25810 Atherosclerosis of coronary artery bypass graft(s) without angina pectoris: Secondary | ICD-10-CM

## 2013-12-13 DIAGNOSIS — Z4889 Encounter for other specified surgical aftercare: Secondary | ICD-10-CM

## 2013-12-13 LAB — CBC WITH DIFFERENTIAL/PLATELET
Basophils Absolute: 0 10*3/uL (ref 0.0–0.1)
Basophils Relative: 0 % (ref 0–1)
Eosinophils Absolute: 0.3 10*3/uL (ref 0.0–0.7)
Eosinophils Relative: 3 % (ref 0–5)
HEMATOCRIT: 28.5 % — AB (ref 36.0–46.0)
Hemoglobin: 9.5 g/dL — ABNORMAL LOW (ref 12.0–15.0)
LYMPHS ABS: 1.1 10*3/uL (ref 0.7–4.0)
Lymphocytes Relative: 10 % — ABNORMAL LOW (ref 12–46)
MCH: 27 pg (ref 26.0–34.0)
MCHC: 33.3 g/dL (ref 30.0–36.0)
MCV: 81 fL (ref 78.0–100.0)
MONO ABS: 0.8 10*3/uL (ref 0.1–1.0)
MONOS PCT: 7 % (ref 3–12)
NEUTROS ABS: 8.7 10*3/uL — AB (ref 1.7–7.7)
Neutrophils Relative %: 80 % — ABNORMAL HIGH (ref 43–77)
Platelets: 315 10*3/uL (ref 150–400)
RBC: 3.52 MIL/uL — AB (ref 3.87–5.11)
RDW: 16.9 % — ABNORMAL HIGH (ref 11.5–15.5)
WBC: 10.9 10*3/uL — ABNORMAL HIGH (ref 4.0–10.5)

## 2013-12-13 LAB — COMPREHENSIVE METABOLIC PANEL
ALT: 126 U/L — ABNORMAL HIGH (ref 0–35)
AST: 123 U/L — ABNORMAL HIGH (ref 0–37)
Albumin: 2.3 g/dL — ABNORMAL LOW (ref 3.5–5.2)
Alkaline Phosphatase: 105 U/L (ref 39–117)
BILIRUBIN TOTAL: 0.6 mg/dL (ref 0.3–1.2)
BUN: 16 mg/dL (ref 6–23)
CO2: 23 mEq/L (ref 19–32)
CREATININE: 0.55 mg/dL (ref 0.50–1.10)
Calcium: 8.6 mg/dL (ref 8.4–10.5)
Chloride: 96 mEq/L (ref 96–112)
GFR calc non Af Amer: 82 mL/min — ABNORMAL LOW (ref >90)
Glucose, Bld: 105 mg/dL — ABNORMAL HIGH (ref 70–99)
POTASSIUM: 4.8 meq/L (ref 3.7–5.3)
Sodium: 130 mEq/L — ABNORMAL LOW (ref 137–147)
Total Protein: 6 g/dL (ref 6.0–8.3)

## 2013-12-13 MED ORDER — SORBITOL 70 % SOLN
60.0000 mL | Status: AC
  Administered 2013-12-13: 60 mL via ORAL
  Filled 2013-12-13: qty 60

## 2013-12-13 NOTE — Progress Notes (Signed)
Physical Therapy Session Note  Patient Details  Name: Maria [name] MRN: [id number] Date of Birth: 1924/07/02  Today's Date: 12/13/2013 Time: 1130-1205 Time Calculation (min): 35 min   Skilled Therapeutic Interventions/Progress Updates:  More alert with improved attention as compared to evaluation however still demonstrating cognitive impairment. Reports nausea and vomiting earlier episode, reports she knows she has to try to get out of bed. Supine> sit max assist for bil LE management, use of pad to assist with positioning of pelvis. Sit <> stands mod/max assist, tactile cues for UE/LE positioning. Ambulation x 6' with mod assist, posterior lean and very slow progression and decreased Rt LE weight bearing. Pt returned to bed to rest until afternoon session, sit > supine max assist. Pt requires encouragement for activity throughout session and may benefit from out of bed schedule for activity tolerance.   List of posterior hip precautions posted on pt's wall for memory aid.   Therapy Documentation Precautions:  Precautions Precautions: Posterior Hip;Fall Precaution Booklet Issued: Yes (comment) Precaution Comments: Pt recalls 2/3 precautions Required Braces or Orthoses: Other Brace/Splint Other Brace/Splint: Prevalon Boot for Rt. Heel  Restrictions Weight Bearing Restrictions: Yes RLE Weight Bearing: Weight bearing as tolerated Pain: Pain Assessment Pain Assessment: 0-10 Pain Score: 5  Pain Type: Surgical pain Pain Location: Hip Pain Orientation: Right;Proximal Pain Descriptors / Indicators: Aching Pain Onset: With Activity Pain Intervention(s): Repositioned;Distraction  See FIM for current functional status  Therapy/Group: Individual Therapy  [place] Rocker 12/13/2013, 12:11 PM

## 2013-12-13 NOTE — Plan of Care (Signed)
Problem: RH BLADDER ELIMINATION Goal: RH STG MANAGE BLADDER WITH ASSISTANCE STG Manage Bladder With min ssistance  Outcome: Not Progressing Patient requiring in and out cath every 6-8 hours

## 2013-12-13 NOTE — Evaluation (Signed)
Physical Therapy Assessment and Plan  Patient Details  Name: Maria [name] MRN: [id number] Date of Birth: Dec 10, 1923  PT Diagnosis: Abnormal posture, Abnormality of gait, Cognitive deficits, Difficulty walking, Impaired cognition, Impaired sensation, Muscle weakness and Pain in Rt heel and Rt hip Rehab Potential: Good ELOS: 2-2.5 weeks   Today's Date: 12/13/2013 Time: 4742-5956 Time Calculation (min): 65 min  Problem List:  Patient Active Problem List   Diagnosis Date Noted  . Femoral neck fracture 12/12/2013  . Atonic bladder 12/11/2013  . Acute confusional state 12/11/2013  . Hyponatremia 12/08/2013  . Fall 12/08/2013  . Closed right hip fracture 12/07/2013  . UTI (lower urinary tract infection) 12/07/2013  . Hip fracture, right 12/07/2013  . Bradycardia 11/07/2013  . Anticoagulation goal of INR 2.5 to 3.5 11/07/2013  . Protein-calorie malnutrition, severe 10/04/2013  . DVT (deep venous thrombosis) 10/03/2013  . Pleural effusion 09/15/2013  . Hx of CABG 09/12/2013  . Malnutrition of mild degree 09/03/2013  . Respiratory failure, post-operative 08/29/2013  . Coronary atherosclerosis of native coronary artery 08/16/2013    Class: Acute  . Mitral regurgitation 08/07/2013  . Pulmonary hypertension, moderate to severe 08/07/2013  . Chronic diastolic heart failure 38/75/6433    Class: Chronic  . Essential hypertension, benign 08/02/2013  . Nontoxic uninodular goiter 08/02/2013  . Unspecified hereditary and idiopathic peripheral neuropathy 08/02/2013  . Occlusion and stenosis of carotid artery without mention of cerebral infarction 08/02/2013  . Unspecified vitamin D deficiency 08/02/2013  . Depressive disorder, not elsewhere classified 08/02/2013  . Paroxysmal atrial fibrillation 08/02/2013  . Trifascicular block 08/02/2013  . Gastric ulcer, unspecified as acute or chronic, without mention of hemorrhage, perforation, or obstruction 08/02/2013  . Sinoatrial node  dysfunction 08/02/2013  . Other dyspnea and respiratory abnormality 08/02/2013  . Encounter for long-term (current) use of other medications 08/02/2013  . NONSPECIFIC ABNORMAL FIND RAD&OTH EXAM GI TRACT 05/30/2008    Past Medical History:  Past Medical History  Diagnosis Date  . Osteoporosis   . Celiac disease   . Celiac disease   . Carotid artery occlusion     right carotid stenosis 40-60%, 4/08, neg for stenosis repeat study 11/11  . Depression   . Presbycusis   . Macular degeneration   . Urethral stricture   . Moderate aortic insufficiency   . Moderate mitral regurgitation by prior echocardiogram     echo 9/13   . Thyroid nodule     55m, no change 11/11  . RLS (restless legs syndrome)   . Vitamin D deficiency   . Heart murmur   . Pulmonary hypertension   . Hypertension     takes Metoprolol daily  . Atrial fibrillation   . Coronary artery disease   . CHF (congestive heart failure)     takes Lasix daily  . MVP (mitral valve prolapse)   . Moderate obstructive sleep apnea     uses CPAP;sleep study about 4-52monthago  . Hearing loss     both ears  . Numbness     left arm  . Joint swelling   . GERD (gastroesophageal reflux disease)     takes Omeprazole daily  . Gastric ulcer     [id number]  . GI bleeding   . Constipation   . Diarrhea   . Hemorrhoids   . Urinary retention   . Urinary anastomotic stricture   . Hypothyroidism     takes Synthroid daily as result of AMiodarone  . Hot flashes  takes ZOloft 3 times a week  . Insomnia     takes Melatonin nightly  . Restless leg   . Peripheral edema     left  . DVT (deep venous thrombosis)   . Atonic bladder 12/11/2013   Past Surgical History:  Past Surgical History  Procedure Laterality Date  . Appendectomy    . Tubal ligation    . Tonsillectomy    . Trigger thumb    . Cardiac catheterization  08-06-13  . Tcs    . Bilateral cataract surgery    . Mandible surgery    . Coronary artery bypass graft  N/A 08/28/2013    Procedure: CORONARY ARTERY BYPASS GRAFTING (CABG) x2 using right greater saphenous vein and left internal mammary artery. ;  Surgeon: Grace Isaac, MD;  Location: [place];  Service: Open Heart Surgery;  Laterality: N/A;  . Intraoperative transesophageal echocardiogram N/A 08/28/2013    Procedure: INTRAOPERATIVE TRANSESOPHAGEAL ECHOCARDIOGRAM;  Surgeon: Grace Isaac, MD;  Location: [place];  Service: Open Heart Surgery;  Laterality: N/A;  . Hip arthroplasty Right 12/09/2013    Procedure: ARTHROPLASTY BIPOLAR HIP CEMENTED;  Surgeon: Kerin Salen, MD;  Location: [facility];  Service: Orthopedics;  Laterality: Right;    Assessment & Plan Clinical Impression: Maria [name] is a 80 y.o. right-handed female with history of CAD/CABG November 2014, hypertension, systolic congestive heart failure, DVT 2014 as well as atrial fibrillation maintained on Xarelto and hyponatremia 125-129. Presented 12/08/2013 after a fall when she slipped and fell on hardwood flooring without loss of consciousness landing on her right hip. X-rays and imaging revealed right femoral neck fracture. Underwent right hip hemiarthroplasty 12/09/2013 per Dr. Mayer Camel. Weightbearing as tolerated with posterior hip cautions.Xarelto resumed for history of atrial fibrillation DVT postoperatively. Her postoperative pain control. Acute blood loss anemia 9.0 and monitored. Patient's husband who is a retired physician states that the patient has a history of SIADH.Patient transferred to CIR on 12/12/2013 .   Patient currently requires max with mobility secondary to muscle weakness, decreased cardiorespiratoy endurance, impaired timing and sequencing, unbalanced muscle activation and decreased coordination, decreased initiation, decreased attention, decreased awareness, decreased problem solving, decreased memory and delayed processing and decreased sitting balance, decreased standing balance, decreased balance strategies and difficulty  maintaining precautions.  Prior to hospitalization, patient was modified independent  with mobility and lived with Spouse in a Independent living facility home. Pt reports she had been told by therapy and staff to utilize a RW and reports using the device outside the home and at night however was not using the RW at time of fall.   Home access is  Level entry.  Patient will benefit from skilled PT intervention to maximize safe functional mobility, minimize fall risk and decrease caregiver burden for planned discharge home with 24 hour supervision.  Anticipate patient will benefit from follow up [place] at discharge.  PT Assessment Rehab Potential: Good PT Patient demonstrates impairments in the following area(s): Balance;Endurance;Pain;Safety;Sensory;Skin Integrity PT Transfers Functional Problem(s): Bed Mobility;Bed to Chair;Car;Furniture PT Locomotion Functional Problem(s): Ambulation;Wheelchair Mobility PT Plan PT Intensity: Minimum of 1-2 x/day ,45 to 90 minutes PT Frequency: 5 out of 7 days PT Duration Estimated Length of Stay: 2-2.5 weeks PT Treatment/Interventions: Ambulation/gait training;Balance/vestibular training;Cognitive remediation/compensation;Disease management/prevention;Discharge planning;DME/adaptive equipment instruction;Neuromuscular re-education;Psychosocial support;Pain management;Skin care/wound management;Functional mobility training;Patient/family education;Splinting/orthotics;Therapeutic Exercise;UE/LE Coordination activities;Therapeutic Activities;UE/LE Strength taining/ROM;Wheelchair propulsion/positioning;Stair training PT Transfers Anticipated Outcome(s): supervision PT Locomotion Anticipated Outcome(s): supervision PT Recommendation Recommendations for Other Services: Speech consult (question whether speech consult  would be beneficial due to cognitive deficits) Follow Up Recommendations: Home health PT;24 hour supervision/assistance;Other (comment) Patient destination:  Assisted Living Equipment Recommended: To be determined  Skilled Therapeutic Intervention Attempted donning Rt TED hose unsuccessfully due to Rt heel pain. Reviewed posterior hip precautions x 2 reps but pt still unable to recall all 3 without cues. Pt tangential in conversation at times and appears to lose track of thought. Pt practiced lateral scooting along bed to Rt/Lt with focus on anterior weight shift, repositioning Rt LE for maintaining precautions, and to prepare for sit <> stands. Multiple sit <> stands for mechanics and generalized activity tolerance. Pt slow to initiate movements, requires encouragement for activity.    PT Evaluation Precautions/Restrictions Precautions Precautions: Posterior Hip;Fall Precaution Booklet Issued: Yes (comment) Precaution Comments: Pt recalls 0/3 precautions, requires frequent cues with functional mobility to remember Required Braces or Orthoses: Other Brace/Splint Other Brace/Splint: Prevalon Boot for Rt. Heel  Restrictions Weight Bearing Restrictions: Yes RLE Weight Bearing: Weight bearing as tolerated General   Vital Signs  Pain Pain Assessment Pain Score: 4  Pain Type: Surgical pain Pain Location: Hip Pain Orientation: Right Pain Descriptors / Indicators: Aching Pain Onset: With Activity Pain Intervention(s): Repositioned (premedicated) Home Living/Prior Functioning Home Living Available Help at Discharge: Available 24 hours/day Type of Home: Independent living facility Home Access: Level entry Home Layout: One level Additional Comments: AFter CABG 11/14 went to [facility] for recovery  Lives With: Spouse Prior Function Level of Independence: Requires assistive device for independence  Able to Take Stairs?:  (did not need to) Driving: No Vocation Requirements: Reports she has started taking sink- side baths instead of shower due to fear of falling.   Cognition Arousal/Alertness: Awake/alert Orientation Level: Oriented  X4 Attention: Sustained Sustained Attention: Impaired Memory: Impaired Memory Impairment: Decreased recall of new information Problem Solving: Impaired Executive Function: Initiating Initiating: Impaired Safety/Judgment: Impaired (reports she was told to use RW however was not using RW at time of fall) Sensation Sensation Light Touch: Impaired Detail Light Touch Impaired Details: Impaired RLE;Impaired LLE (pt reports due to old age) Coordination Gross Motor Movements are Fluid and Coordinated: Yes Motor  Motor Motor - Skilled Clinical Observations: Very slow initiation of all movements.   Mobility Bed Mobility Bed Mobility: Rolling Left;Left Sidelying to Sit Rolling Left: 2: Max assist Rolling Left Details (indicate cue type and reason): Use of pillow between legs for maintaining precautions, facilitation of UE use and positioning.  Left Sidelying to Sit: 2: Max assist Left Sidelying to Sit Details (indicate cue type and reason): Max assist for bil LEs and to upright trunk, step by step sequencing verbal/tactile cues for UE positioning. Use of pad to assist with pulling posiitoing pelvis and scooting to EOB Transfers Transfers: Yes Sit to Stand: 2: Max assist;3: Mod assist;From bed Sit to Stand Details (indicate cue type and reason): Max assist progressing to mod assist with repetition, posterior lean that decreased with repetition.  Stand to Sit: 3: Mod assist;To chair/3-in-1;To bed Stand Pivot Transfers: 3: Mod assist;2: Max assist Stand Pivot Transfer Details (indicate cue type and reason): Cues for sequenicng, assist for posterior lean with fluctuated mod/max assist.  Locomotion  Ambulation Ambulation: No (due to time limitations and pt fatigue)  Trunk/Postural Assessment    Decreased balance reactions particularly in standing Balance Balance Balance Assessed: Yes Static Sitting Balance Static Sitting - Balance Support: Bilateral upper extremity supported Static Sitting  - Level of Assistance: 4: Min assist;5: Stand by assistance Static Sitting - Comment/#  of Minutes: min assist progressing to stand by assist Static Standing Balance Static Standing - Balance Support: Bilateral upper extremity supported Static Standing - Level of Assistance: 3: Mod assist Static Standing - Comment/# of Minutes: posterior lean, does well with facilitation to weight shift over feet.  Extremity Assessment      RLE Assessment RLE Assessment: Exceptions to [facility] RLE Strength RLE Overall Strength Comments: Significant weakness, able to tolerate some resistance with knee extension but otherwise unable tolerate other MMT due to pain.  LLE Assessment LLE Assessment: Exceptions to [facility] LLE Strength LLE Overall Strength Comments: Generalized deconditioning grossly >/= 3+/5  FIM:    See FIM ratings for evaluation  Refer to Care Plan for Long Term Goals  Recommendations for other services: Other: depending on cognitive presentation pt may benefit from SLP evaluation  Discharge Criteria: Patient will be discharged from PT if patient refuses treatment 3 consecutive times without medical reason, if treatment goals not met, if there is a change in medical status, if patient makes no progress towards goals or if patient is discharged from hospital.  The above assessment, treatment plan, treatment alternatives and goals were discussed and mutually agreed upon: by patient  Lahoma Rocker 12/13/2013, 9:32 AM

## 2013-12-13 NOTE — Progress Notes (Signed)
Patient ID: BLU MCGLAUN, female   DOB: [birth year], 80 y.o.   MRN: [id number] S:reports N/V this am O:BP 92/63  Pulse 66  Temp(Src) 97 F (36.1 C) (Oral)  Resp 17  Ht 5' 1"  (1.549 m)  Wt 57 kg (125 lb 10.6 oz)  BMI 23.76 kg/m2  SpO2 94%  Intake/Output Summary (Last 24 hours) at 12/13/13 1129 Last data filed at 12/13/13 0700  Gross per 24 hour  Intake    360 ml  Output    900 ml  Net   -540 ml   Intake/Output: I/O last 3 completed shifts: In: 360 [P.O.:360] Out: 900 [Urine:900]  Intake/Output this shift:    Weight change:  Maria [name], elderly WF lying in bed CVS:no rub Resp:cta OVF:IEPPIR Ext:no edema   Recent Labs Lab 12/08/13 1405  12/09/13 0158 12/09/13 0900 12/09/13 1132 12/09/13 1941 12/10/13 1015 12/11/13 0413 12/12/13 0620 12/13/13 0613  NA 121*  < > 127* 131* 130* 133* 129* 129* 126* 130*  K 4.6  --  4.8  --  4.5  --  4.3 4.1 4.4 4.8  CL 90*  --  94*  --  97  --  97 95* 95* 96  CO2 19  --  21  --  21  --  21 21 22 23   GLUCOSE 82  --  90  --  83  --  108* 103* 112* 105*  BUN 11  --  11  --  9  --  7 11 15 16   CREATININE 0.63  --  0.67  --  0.59  --  0.57 0.65 0.54 0.55  ALBUMIN  --   --   --   --   --   --   --   --   --  2.3*  CALCIUM 8.4  --  8.8  --  8.9  --  8.3* 8.3* 8.3* 8.6  AST  --   --   --   --   --   --   --   --   --  123*  ALT  --   --   --   --   --   --   --   --   --  126*  < > = values in this interval not displayed. Liver Function Tests:  Recent Labs Lab 12/13/13 0613  AST 123*  ALT 126*  ALKPHOS 105  BILITOT 0.6  PROT 6.0  ALBUMIN 2.3*   No results found for this basename: LIPASE, AMYLASE,  in the last 168 hours No results found for this basename: AMMONIA,  in the last 168 hours CBC:  Recent Labs Lab 12/07/13 2028  12/09/13 0158 12/10/13 1015 12/11/13 0413 12/12/13 0620 12/13/13 0613  WBC 10.7*  < > 8.9 11.9* 12.4* 10.0 10.9*  NEUTROABS 8.6*  --   --   --   --   --  8.7*  HGB 11.1*  < > 10.6* 10.0* 9.0* 9.4*  9.5*  HCT 32.0*  < > 31.7* 30.3* 27.7* 27.8* 28.5*  MCV 79.8  < > 80.7 81.9 82.4 82.2 81.0  PLT 360  < > 315 315 290 277 315  < > = values in this interval not displayed. Cardiac Enzymes: No results found for this basename: CKTOTAL, CKMB, CKMBINDEX, TROPONINI,  in the last 168 hours CBG:  Recent Labs Lab 12/10/13 1622  GLUCAP 122*    Iron Studies: No results found for this basename: IRON, TIBC, TRANSFERRIN, FERRITIN,  in  the last 72 hours Studies/Results: No results found. . docusate sodium  100 mg Oral BID  . furosemide  20 mg Oral Daily  . levofloxacin  250 mg Oral Daily  . levothyroxine  50 mcg Oral QAC breakfast  . polyethylene glycol  17 g Oral Daily  . Rivaroxaban  20 mg Oral Q supper  . sodium chloride  2 g Oral TID WC  . tiZANidine  2 mg Oral TID    BMET    Component Value Date/Time   NA 130* 12/13/2013 0613   K 4.8 12/13/2013 0613   CL 96 12/13/2013 0613   CO2 23 12/13/2013 0613   GLUCOSE 105* 12/13/2013 0613   BUN 16 12/13/2013 0613   CREATININE 0.55 12/13/2013 0613   CALCIUM 8.6 12/13/2013 0613   GFRNONAA 82* 12/13/2013 0613   GFRAA >90 12/13/2013 0613   CBC    Component Value Date/Time   WBC 10.9* 12/13/2013 0613   RBC 3.52* 12/13/2013 0613   HGB 9.5* 12/13/2013 0613   HCT 28.5* 12/13/2013 0613   PLT 315 12/13/2013 0613   MCV 81.0 12/13/2013 0613   MCH 27.0 12/13/2013 0613   MCHC 33.3 12/13/2013 0613   RDW 16.9* 12/13/2013 0613   LYMPHSABS 1.1 12/13/2013 0613   MONOABS 0.8 12/13/2013 0613   EOSABS 0.3 12/13/2013 0613   BASOSABS 0.0 12/13/2013 0613    Assessment/Plan:  1. Hyponatremia- likely SIADH (possibly due to SSRI) but also on DDx would be reset osmostat and/or adrenal insufficiency (low cortisol of 7.5). Sodium had improved with samsca and now doing well with lasix 39m daily, salt tabs, and free water restriction  1. Cont with fluid restriction and salt tabs and follow Na closely as we may need to restart samsca 2. Stressed the importance of increasing her  protein intake 3. Will be able to continue to monitor labs once transferred to CIR 2. R hip fx- s/p THA on 12/09/13 3. CHF (diastolic)- euvolemic 4. Constipation- ok to give MOM as she reports this has helped in the past 1. Consider abd series to r/o obstruction given persistent N/V/constipation 5. Urinary retention- cont with prn intermittent I&O caths 6. Abnormal LFT's- cont to follow 7. HTN- low, holding meds. 8. CAD s/p CABG 11/14 9. LLE DVT 12/14 10. Hypothyroidism- TSH 2.175 11. ABLA- follow h/h 12. Dispo- s/p transfer to CIR but not willing to work with PT due to Nausea and weakness.  Stressed the importance of getting out of bed and moving 13.   [place]

## 2013-12-13 NOTE — Progress Notes (Signed)
Patient information reviewed and entered into eRehab system by Zabria Liss, RN, CRRN, PPS Coordinator.  Information including medical coding and functional independence measure will be reviewed and updated through discharge.     Per nursing patient was given "Data Collection Information Summary for Patients in Inpatient Rehabilitation Facilities with attached "Privacy Act Statement-Health Care Records" upon admission.  

## 2013-12-13 NOTE — Progress Notes (Signed)
Orthopedic Tech Progress Note Patient Details:  Maria [name] 13-Jul-1924 [id number]  Patient ID: [name], female   DOB: 1924-07-07, 80 y.o.   MRN: [id number] Staff informed that prevalon boot comes from [place] 12/13/2013, 1:26 PM

## 2013-12-13 NOTE — Discharge Instructions (Addendum)
Information on my medicine - XARELTO (Rivaroxaban)  This medication education was reviewed with me or my healthcare representative as part of my discharge preparation.  The pharmacist that spoke with me during my hospital stay was:  Steffanie Dunn, PharmD  Why was Xarelto prescribed for you? Xarelto was prescribed for you to reduce the risk of a blood clot forming that can cause a stroke if you have a medical condition called atrial fibrillation (a type of irregular heartbeat).  What do you need to know about xarelto ? Take your Xarelto ONCE DAILY at the same time every day with your evening meal. If you have difficulty swallowing the tablet whole, you may crush it and mix in applesauce just prior to taking your dose.  Take Xarelto exactly as prescribed by your doctor and DO NOT stop taking Xarelto without talking to the doctor who prescribed the medication.  Stopping without other stroke prevention medication to take the place of Xarelto may increase your risk of developing a clot that causes a stroke.  Refill your prescription before you run out.  After discharge, you should have regular check-up appointments with your healthcare provider that is prescribing your Xarelto.  In the future your dose may need to be changed if your kidney function or weight changes by a significant amount.  What do you do if you miss a dose? If you are taking Xarelto ONCE DAILY and you miss a dose, take it as soon as you remember on the same day then continue your regularly scheduled once daily regimen the next day. Do not take two doses of Xarelto at the same time or on the same day.   Important Safety Information A possible side effect of Xarelto is bleeding. You should call your healthcare provider right away if you experience any of the following:   Bleeding from an injury or your nose that does not stop.   Unusual colored urine (red or dark brown) or unusual colored stools (red or black).   Unusual  bruising for unknown reasons.   A serious fall or if you hit your head (even if there is no bleeding).  Some medicines may interact with Xarelto and might increase your risk of bleeding while on Xarelto. To help avoid this, consult your healthcare provider or pharmacist prior to using any new prescription or non-prescription medications, including herbals, vitamins, non-steroidal anti-inflammatory drugs (NSAIDs) and supplements.  This website has more information on Xarelto: [email or web address]  Inpatient Rehab Discharge Instructions  Maria [name] Discharge date and time: No discharge date for patient encounter.   Activities/Precautions/ Functional Status: Activity: Weightbearing as tolerated with posterior hip precautions right lower extremity Diet: Gluten-free Wound Care: keep wound clean and dry Functional status:  ___ No restrictions     ___ Walk up steps independently ___ 24/7 supervision/assistance   ___ Walk up steps with assistance ___ Intermittent supervision/assistance  ___ Bathe/dress independently ___ Walk with walker     ___ Bathe/dress with assistance ___ Walk Independently    ___ Shower independently _x__ Walk with assistance    ___ Shower with assistance ___ No alcohol     ___ Return to work/school ________  COMMUNITY REFERRALS UPON DISCHARGE:   Home Health:   PT     OT       RN    Agency:  [facility] Phone:  [phone] Medical Equipment/Items Ordered:  18x16 wheelchair; rolling walker  Agency/Supplier:  [facility]         Phone:  [phone]  Special Instructions:  Follow up sodium level one/two weeks prevalon boot for right heel with home health nurse follow up My questions have been answered and I understand these instructions. I will adhere to these goals and the provided educational materials after my discharge from the hospital.  Patient/Caregiver Signature _______________________________ Date __________  Clinician Signature  _______________________________________ Date __________  Please bring this form and your medication list with you to all your follow-up doctor's appointments.

## 2013-12-13 NOTE — Evaluation (Signed)
Occupational Therapy Assessment and Plan  Patient Details  Name: Maria [name] MRN: [id number] Date of Birth: Mar 19, 1924  OT Diagnosis: acute pain, altered mental status, muscle weakness (generalized) and pain in joint Rehab Potential: Rehab Potential: Good ELOS: 15-18 days   Today's Date: 12/13/2013 Time: 0300-9233 Time Calculation (min): 62 min  Problem List:  Patient Active Problem List   Diagnosis Date Noted  . Femoral neck fracture 12/12/2013  . Atonic bladder 12/11/2013  . Acute confusional state 12/11/2013  . Hyponatremia 12/08/2013  . Fall 12/08/2013  . Closed right hip fracture 12/07/2013  . UTI (lower urinary tract infection) 12/07/2013  . Hip fracture, right 12/07/2013  . Bradycardia 11/07/2013  . Anticoagulation goal of INR 2.5 to 3.5 11/07/2013  . Protein-calorie malnutrition, severe 10/04/2013  . DVT (deep venous thrombosis) 10/03/2013  . Pleural effusion 09/15/2013  . Hx of CABG 09/12/2013  . Malnutrition of mild degree 09/03/2013  . Respiratory failure, post-operative 08/29/2013  . Coronary atherosclerosis of native coronary artery 08/16/2013    Class: Acute  . Mitral regurgitation 08/07/2013  . Pulmonary hypertension, moderate to severe 08/07/2013  . Chronic diastolic heart failure 00/76/2263    Class: Chronic  . Essential hypertension, benign 08/02/2013  . Nontoxic uninodular goiter 08/02/2013  . Unspecified hereditary and idiopathic peripheral neuropathy 08/02/2013  . Occlusion and stenosis of carotid artery without mention of cerebral infarction 08/02/2013  . Unspecified vitamin D deficiency 08/02/2013  . Depressive disorder, not elsewhere classified 08/02/2013  . Paroxysmal atrial fibrillation 08/02/2013  . Trifascicular block 08/02/2013  . Gastric ulcer, unspecified as acute or chronic, without mention of hemorrhage, perforation, or obstruction 08/02/2013  . Sinoatrial node dysfunction 08/02/2013  . Other dyspnea and respiratory abnormality  08/02/2013  . Encounter for long-term (current) use of other medications 08/02/2013  . NONSPECIFIC ABNORMAL FIND RAD&OTH EXAM GI TRACT 05/30/2008    Past Medical History:  Past Medical History  Diagnosis Date  . Osteoporosis   . Celiac disease   . Celiac disease   . Carotid artery occlusion     right carotid stenosis 40-60%, 4/08, neg for stenosis repeat study 11/11  . Depression   . Presbycusis   . Macular degeneration   . Urethral stricture   . Moderate aortic insufficiency   . Moderate mitral regurgitation by prior echocardiogram     echo 9/13   . Thyroid nodule     31m, no change 11/11  . RLS (restless legs syndrome)   . Vitamin D deficiency   . Heart murmur   . Pulmonary hypertension   . Hypertension     takes Metoprolol daily  . Atrial fibrillation   . Coronary artery disease   . CHF (congestive heart failure)     takes Lasix daily  . MVP (mitral valve prolapse)   . Moderate obstructive sleep apnea     uses CPAP;sleep study about 4-580monthago  . Hearing loss     both ears  . Numbness     left arm  . Joint swelling   . GERD (gastroesophageal reflux disease)     takes Omeprazole daily  . Gastric ulcer     6-38m70monthgo  . GI bleeding   . Constipation   . Diarrhea   . Hemorrhoids   . Urinary retention   . Urinary anastomotic stricture   . Hypothyroidism     takes Synthroid daily as result of AMiodarone  . Hot flashes     takes ZOloft 3 times a week  .  Insomnia     takes Melatonin nightly  . Restless leg   . Peripheral edema     left  . DVT (deep venous thrombosis)   . Atonic bladder 12/11/2013   Past Surgical History:  Past Surgical History  Procedure Laterality Date  . Appendectomy    . Tubal ligation    . Tonsillectomy    . Trigger thumb    . Cardiac catheterization  08-06-13  . Tcs    . Bilateral cataract surgery    . Mandible surgery    . Coronary artery bypass graft N/A 08/28/2013    Procedure: CORONARY ARTERY BYPASS GRAFTING (CABG) x2  using right greater saphenous vein and left internal mammary artery. ;  Surgeon: Grace Isaac, MD;  Location: [facility];  Service: Open Heart Surgery;  Laterality: N/A;  . Intraoperative transesophageal echocardiogram N/A 08/28/2013    Procedure: INTRAOPERATIVE TRANSESOPHAGEAL ECHOCARDIOGRAM;  Surgeon: Grace Isaac, MD;  Location: [facility];  Service: Open Heart Surgery;  Laterality: N/A;  . Hip arthroplasty Right 12/09/2013    Procedure: ARTHROPLASTY BIPOLAR HIP CEMENTED;  Surgeon: Kerin Salen, MD;  Location: [facility];  Service: Orthopedics;  Laterality: Right;    Assessment & Plan Clinical Impression: Patient is a 80 y.o. year old right-handed female with history of CAD/CABG November 2014, hypertension, systolic congestive heart failure, DVT 2014 as well as atrial fibrillation maintained on Xarelto and hyponatremia 125-129. Presented 12/08/2013 after a fall when she slipped and fell on hardwood flooring without loss of consciousness landing on her right hip. X-rays and imaging revealed right femoral neck fracture. Preop hyponatremia 124 received intravenous fluids of normal saline of 1 L. Underwent right hip hemiarthroplasty 12/09/2013 per Dr. Mayer Camel. Weightbearing as tolerated with posterior hip cautions.Xarelto resumed for history of atrial fibrillation DVT postoperatively. Her postoperative pain control. Acute blood loss anemia 9.0 and monitored. Latest sodium level 129 and remains on fluid restriction. Physical and occupational therapy evaluations completed 12/10/2013 with recommendations for physical medicine rehabilitation consult to consider inpatient rehabilitation services.  Patient's husband who is a retired physician states that the patient has a history of SIADH.  Patient transferred to CIR on 12/12/2013 .    Patient currently requires max assist with basic self-care skills secondary to muscle weakness and decreased initiation, decreased attention, decreased awareness, decreased problem  solving, decreased memory and delayed processing.  Prior to hospitalization, patient could complete BADL  Independently, modified by use of RW and extra time.  Patient will benefit from skilled intervention to increase independence with basic self-care skills prior to discharge home with care partner.  Anticipate patient will require minimal physical assistance and follow up home health.  OT - End of Session Activity Tolerance: Tolerates 10 - 20 min activity with multiple rests Endurance Deficit: Yes OT Assessment Rehab Potential: Good OT Patient demonstrates impairments in the following area(s): Balance;Cognition;Endurance;Pain;Behavior OT Basic ADL's Functional Problem(s): Bathing;Dressing;Toileting OT Transfers Functional Problem(s): Toilet;Tub/Shower OT Additional Impairment(s): None OT Plan OT Intensity: Minimum of 1-2 x/day, 45 to 90 minutes OT Frequency: 5 out of 7 days OT Duration/Estimated Length of Stay: 15-18 days OT Treatment/Interventions: Therapeutic Activities;Self Care/advanced ADL retraining;Pain management;Functional mobility training;Patient/family education;Therapeutic Exercise;UE/LE Strength taining/ROM;DME/adaptive equipment instruction;Cognitive remediation/compensation;Discharge planning;Balance/vestibular training;Wheelchair propulsion/positioning OT Self Feeding Anticipated Outcome(s): Mod I OT Basic Self-Care Anticipated Outcome(s): Min A OT Toileting Anticipated Outcome(s): Supervision OT Bathroom Transfers Anticipated Outcome(s): Supervision OT Recommendation Patient destination: Home Follow Up Recommendations: Home health OT Equipment Recommended: To be determined  Skilled Therapeutic Intervention 1:1 OT initial  evaluation completed with treatment provided to emphasize improved activity tolerance, pain management, transfers, and adapted bathing and dressing skills.   Patient performed bath at sink, standing to wash peri-area, with mod assist to maintain  balance.   Patient completed upper body bathing and dressing but required assist with toilet transfer due to urgent need to void BM (diarrhea).   Patient required extra time to eliminate; RN tech alerted for need to assist with hygiene and transfer back to w/c.  OT Evaluation Precautions/Restrictions  Precautions Precautions: Posterior Hip;Fall Precaution Booklet Issued: Yes (comment) Precaution Comments: Pt recalls 2/3 precautions Required Braces or Orthoses: Other Brace/Splint Other Brace/Splint: Prevalon Boot for Rt. Heel  Restrictions Weight Bearing Restrictions: Yes RLE Weight Bearing: Weight bearing as tolerated  General Chart Reviewed: Yes Family/Caregiver Present: No  Vital Signs Therapy Vitals Temp: 97.6 F (36.4 C) Temp src: Oral Pulse Rate: 64 Resp: 17 BP: 122/35 mmHg Patient Position, if appropriate: Lying Oxygen Therapy SpO2: 98 % O2 Device: None (Room air)  Pain Pain Assessment Pain Assessment: 0-10 Pain Score: 5  Pain Type: Surgical pain Pain Location: Hip Pain Orientation: Right;Proximal Pain Descriptors / Indicators: Aching Pain Onset: With Activity Pain Intervention(s): Repositioned;Distraction  Home Living/Prior Functioning Home Living Available Help at Discharge: Available 24 hours/day Type of Home: Independent living facility Home Access: Level entry Home Layout: One level Additional Comments: AFter CABG 11/14 went to [facility] for recovery  Lives With: Spouse IADL History Homemaking Responsibilities: No Prior Function Level of Independence: Requires assistive device for independence  Able to Take Stairs?:  (did not need to) Driving: No 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   ADL ADL ADL Comments: see FIM  Vision/Perception  Vision - History Baseline Vision: No visual deficits Patient Visual Report: No change from baseline Vision -  Assessment Eye Alignment: Within Functional Limits Perception Perception: Within Functional Limits Praxis Praxis: Intact   Cognition Overall Cognitive Status: Difficult to assess Arousal/Alertness: Awake/alert Orientation Level: Oriented X4 Attention: Sustained Sustained Attention: Impaired Sustained Attention Impairment: Functional basic;Verbal complex Memory: Appears intact Memory Impairment: Decreased recall of new information Awareness: Impaired Awareness Impairment: Intellectual impairment Problem Solving: Impaired Problem Solving Impairment: Verbal complex;Functional basic Executive Function: Decision Making;Initiating;Organizing Organizing: Impaired Organizing Impairment: Functional basic;Verbal complex Decision Making: Impaired Decision Making Impairment: Verbal complex;Functional basic Initiating: Impaired Initiating Impairment: Verbal complex;Functional basic Safety/Judgment: Impaired  Sensation Sensation Light Touch: Impaired Detail Light Touch Impaired Details: Impaired RLE;Impaired LLE Stereognosis: Appears Intact Hot/Cold: Appears Intact Proprioception: Appears Intact Coordination Gross Motor Movements are Fluid and Coordinated: Yes Fine Motor Movements are Fluid and Coordinated: Yes  Motor  Motor Motor: Within Functional Limits Motor - Skilled Clinical Observations: Very slow initiation of all movements.   Mobility  Bed Mobility Bed Mobility: Rolling Left;Left Sidelying to Sit Rolling Left: 2: Max assist Rolling Left Details (indicate cue type and reason): Use of pillow between legs for maintaining precautions, facilitation of UE use and positioning.  Left Sidelying to Sit: 2: Max assist Left Sidelying to Sit Details (indicate cue type and reason): Max assist for bil LEs and to upright trunk, step by step sequencing verbal/tactile cues for UE positioning. Use of pad to assist with pulling posiitoing pelvis and scooting to EOB Transfers Transfers:  Sit to Stand;Stand to Sit Sit to Stand: 3: Mod assist (from w/c to toilet) Sit to Stand Details: Manual facilitation for weight shifting;Verbal cues for technique;Verbal cues for precautions/safety Sit to  Stand Details (indicate cue type and reason): Max assist progressing to mod assist with repetition, posterior lean that decreased with repetition.  Stand to Sit: 3: Mod assist (to toilet) Stand to Sit Details (indicate cue type and reason): Verbal cues for precautions/safety;Verbal cues for safe use of DME/AE   Trunk/Postural Assessment  Cervical Assessment Cervical Assessment: Within Functional Limits Thoracic Assessment Thoracic Assessment: Exceptions to [facility] Thoracic AROM Overall Thoracic AROM: Deficits (kyphosis) Lumbar Assessment Lumbar Assessment: Within Functional Limits Postural Control Postural Control: Deficits on evaluation Postural Limitations: pprone to posterior pelvic tilt but with ability to correct with cues   Balance Balance Balance Assessed: Yes Static Sitting Balance Static Sitting - Balance Support: Bilateral upper extremity supported Static Sitting - Level of Assistance: 5: Stand by assistance Static Sitting - Comment/# of Minutes: min assist progressing to stand by assist Static Standing Balance Static Standing - Balance Support: Bilateral upper extremity supported Static Standing - Level of Assistance: 4: Min assist Static Standing - Comment/# of Minutes: posterior lean, does well with facilitation to weight shift over feet.   Extremity/Trunk Assessment RUE Assessment RUE Assessment: Within Functional Limits LUE Assessment LUE Assessment: Within Functional Limits  FIM:  FIM - Grooming Grooming Steps: Wash, rinse, dry face;Wash, rinse, dry hands Grooming: 3: Patient completes 2 of 4 or 3 of 5 steps FIM - Bathing Bathing Steps Patient Completed: Chest;Right Arm;Left Arm;Abdomen;Front perineal area Bathing: 3: Mod-Patient completes 5-7 [id number] parts or  50-74% FIM - Upper Body Dressing/Undressing Upper body dressing/undressing steps patient completed: Thread/unthread right bra strap;Thread/unthread left bra strap;Thread/unthread right sleeve of pullover shirt/dresss;Thread/unthread left sleeve of pullover shirt/dress;Put head through opening of pull over shirt/dress;Pull shirt over trunk Upper body dressing/undressing: 4: Min-Patient completed 75 plus % of tasks FIM - Lower Body Dressing/Undressing Lower body dressing/undressing: 1: Total-Patient completed less than 25% of tasks FIM - Toileting Toileting: 1: Total-Patient completed zero steps, helper did all 3 FIM - TRadio producerDevices: Grab bars Toilet Transfers: 3-To toilet/BSC: Mod A (lift or lower assist) FIM - TCamera operatorTransfers: 0-Activity did not occur or was simulated   Refer to Care Plan for Long Term Goals  Recommendations for other services: None  Discharge Criteria: Patient will be discharged from OT if patient refuses treatment 3 consecutive times without medical reason, if treatment goals not met, if there is a change in medical status, if patient makes no progress towards goals or if patient is discharged from hospital.  The above assessment, treatment plan, treatment alternatives and goals were discussed and mutually agreed upon: by patient  Second session: Time: 1345-1430 Time Calculation (min):  45 min  Pain Assessment: 6/10, right hip  Skilled Therapeutic Interventions: ADL-retraining with focus on improved bed mobility, effective use of DME (bed rails, grab bars), transfers (bed <> w/c, w/c <> tub bench), and pain management.   Patient received in bed with her daughter attending.   Patient required extra time and significant redirection to engage in therapy.  With copious verbal cues and exhortations, patient performed bed mobility, transferred to w/c and transferred from w/c to tub bench with max-mod assist and  verbal cues to sequence and to use DME correctly.   Due to exertion, patient reported ongoing diarrhea and voided into diaper.   At patient's request RN-tech was alerted to assist with bowel management and hygiene.   Patient left with RN tech after assist with transfer back to bed.  See FIM for current functional status  Therapy/Group: Individual Therapy  [facility] 12/13/2013, 4:04 PM

## 2013-12-13 NOTE — Progress Notes (Signed)
Nurse Tech cath patient at 2100, 12/12/2012 and placed result in the wrong column. Total output 715. Patient was also scanned, but NT forgot to record results. Archie Shea A. Symphony Demuro, RN.

## 2013-12-13 NOTE — Progress Notes (Signed)
Subjective/Complaints: Had to be cathed night. No results with bowel meds. Ready to get started with therapies. Felt that she was a little confused last night---"seeing things when I closed my eyes" A 12 point review of systems has been performed and if not noted above is otherwise negative.   Objective: Vital Signs: Blood pressure 92/63, pulse 66, temperature 97 F (36.1 C), temperature source Oral, resp. rate 17, height 5' 1"  (1.549 m), weight 57 kg (125 lb 10.6 oz), SpO2 94.00%. No results found.  Recent Labs  12/12/13 0620 12/13/13 0613  WBC 10.0 10.9*  HGB 9.4* 9.5*  HCT 27.8* 28.5*  PLT 277 315    Recent Labs  12/12/13 0620 12/13/13 0613  NA 126* 130*  K 4.4 4.8  CL 95* 96  GLUCOSE 112* 105*  BUN 15 16  CREATININE 0.54 0.55  CALCIUM 8.3* 8.6   CBG (last 3)   Recent Labs  12/10/13 1622  GLUCAP 122*    Wt Readings from Last 3 Encounters:  12/12/13 57 kg (125 lb 10.6 oz)  12/07/13 44.453 kg (98 lb)  12/07/13 44.453 kg (98 lb)    Physical Exam:  Constitutional: She is oriented to person, place, and time. No distress.  HENT:  Head: Normocephalic.  Eyes: EOM are normal.  Neck: Normal range of motion. Neck supple. No thyromegaly present.   Cardiovascular:  Cardiac rate controlled. No murmur. Chest incision clean/healed  Respiratory: Effort normal and breath sounds normal. No respiratory distress. No rales or wheezes  GI: Soft. Bowel sounds are normal. She exhibits no distension.  Neurological: She is alert and oriented to person, place, and time.  Musc: right leg quite tender.  Follows commands. Somewhat distracted.UE's 4+ to 5/5 at deltoid, bicep, tricep, HI. LLE 2HF, 3- KE and 4/5 with ADF/APF. RLE trace hip (pain), 1 KE, 3+ ADF/APF. Sensation appears grossly intact. DTR's 1+  Skin:  Hip incision dry, intact, dressed and  tender, 4cm blood blister on right heel which is tender to touch.   Assessment/Plan: 1. Functional deficits secondary  to right femoral neck fracture which require 3+ hours per day of interdisciplinary therapy in a comprehensive inpatient rehab setting. Physiatrist is providing close team supervision and 24 hour management of active medical problems listed below. Physiatrist and rehab team continue to assess barriers to discharge/monitor patient progress toward functional and medical goals. FIM:                   Comprehension Comprehension Mode: Auditory Comprehension: 4-Understands basic 75 - 89% of the time/requires cueing 10 - 24% of the time  Expression Expression Mode: Verbal Expression: 4-Expresses basic 75 - 89% of the time/requires cueing 10 - 24% of the time. Needs helper to occlude trach/needs to repeat words.  Social Interaction Social Interaction: 5-Interacts appropriately 90% of the time - Needs monitoring or encouragement for participation or interaction.  Problem Solving Problem Solving: 3-Solves basic 50 - 74% of the time/requires cueing 25 - 49% of the time  Memory Memory: 4-Recognizes or recalls 75 - 89% of the time/requires cueing 10 - 24% of the time  Medical Problem List and Plan:  1. Right femoral neck fracture secondary to fall. Status post hemiarthroplasty 12/09/2013 weightbearing as tolerated with posterior hip cautions  2. DVT Prophylaxis/Anticoagulation: Xarelto. Monitor for any signs of DVT  3. Pain Management: Zanaflex 2 mg 3 times a day, Ultram 50 mg every 6 hours as  needed pain. May have to  back off ultram if she continues to have hallucinations or AMS 4. Neuropsych: This patient is capable of making decisions on her own behalf.  5. Acute blood loss anemia. Latest hemoglobin 9.4. Followup CBC  6. SIADH. Sodium level up to 130.  1200 cc fluid restriction---and sodium chloride tablets  (CAN THESE BE STOPPED?)   - Continue low-dose Lasix as per nephrology services  7. Diastolic congestive heart failure. No signs of fluid overload. Continue low-dose diuretic  8.  CAD/CABG. Followup cardiology services as needed. No chest pain or shortness of breath  9. Atrial fibrillation. Cardiac rate control. Continue xarelto  10. Hypothyroidism. Synthroid. Latest TSH level 2.175  11. Klebsiella/Escherichia coli UTI with urinary retention.  -continue levaquin  -I wish to continue voiding trial with cathing prn-  -needs to be up to void AT TOILET OR BSC  -RX CONSTIPATION--sorbitol, supp tonight  12. Skin breakdown right heel--  -elevate  -prevalon boot  LOS (Days) 1 A FACE TO FACE EVALUATION WAS PERFORMED  Maria [name] 12/13/2013 8:01 AM

## 2013-12-14 ENCOUNTER — Ambulatory Visit (HOSPITAL_COMMUNITY): Payer: Medicare Other

## 2013-12-14 ENCOUNTER — Inpatient Hospital Stay (HOSPITAL_COMMUNITY): Payer: Medicare Other

## 2013-12-14 ENCOUNTER — Inpatient Hospital Stay (HOSPITAL_COMMUNITY): Payer: Medicare Other | Admitting: Physical Therapy

## 2013-12-14 DIAGNOSIS — S72009A Fracture of unspecified part of neck of unspecified femur, initial encounter for closed fracture: Secondary | ICD-10-CM

## 2013-12-14 DIAGNOSIS — I4891 Unspecified atrial fibrillation: Secondary | ICD-10-CM

## 2013-12-14 DIAGNOSIS — I25810 Atherosclerosis of coronary artery bypass graft(s) without angina pectoris: Secondary | ICD-10-CM

## 2013-12-14 DIAGNOSIS — W010XXA Fall on same level from slipping, tripping and stumbling without subsequent striking against object, initial encounter: Secondary | ICD-10-CM

## 2013-12-14 DIAGNOSIS — Z4889 Encounter for other specified surgical aftercare: Secondary | ICD-10-CM

## 2013-12-14 DIAGNOSIS — D62 Acute posthemorrhagic anemia: Secondary | ICD-10-CM

## 2013-12-14 LAB — COMPREHENSIVE METABOLIC PANEL
ALT: 105 U/L — AB (ref 0–35)
AST: 94 U/L — AB (ref 0–37)
Albumin: 2.1 g/dL — ABNORMAL LOW (ref 3.5–5.2)
Alkaline Phosphatase: 100 U/L (ref 39–117)
BUN: 13 mg/dL (ref 6–23)
CO2: 22 mEq/L (ref 19–32)
Calcium: 8.3 mg/dL — ABNORMAL LOW (ref 8.4–10.5)
Chloride: 96 mEq/L (ref 96–112)
Creatinine, Ser: 0.56 mg/dL (ref 0.50–1.10)
GFR calc non Af Amer: 81 mL/min — ABNORMAL LOW (ref >90)
Glucose, Bld: 92 mg/dL (ref 70–99)
POTASSIUM: 4.2 meq/L (ref 3.7–5.3)
SODIUM: 130 meq/L — AB (ref 137–147)
TOTAL PROTEIN: 5.5 g/dL — AB (ref 6.0–8.3)
Total Bilirubin: 0.6 mg/dL (ref 0.3–1.2)

## 2013-12-14 MED ORDER — MIDAZOLAM HCL 5 MG/ML IJ SOLN
INTRAMUSCULAR | Status: AC
  Filled 2013-12-14: qty 2

## 2013-12-14 MED ORDER — TIZANIDINE HCL 2 MG PO TABS
2.0000 mg | ORAL_TABLET | Freq: Two times a day (BID) | ORAL | Status: DC
  Administered 2013-12-15 – 2014-01-03 (×39): 2 mg via ORAL
  Filled 2013-12-14 (×42): qty 1

## 2013-12-14 MED ORDER — FENTANYL CITRATE 0.05 MG/ML IJ SOLN
INTRAMUSCULAR | Status: AC
  Filled 2013-12-14: qty 2

## 2013-12-14 MED ORDER — TIZANIDINE HCL 4 MG PO TABS
4.0000 mg | ORAL_TABLET | Freq: Every day | ORAL | Status: DC
  Administered 2013-12-14 – 2014-01-02 (×20): 4 mg via ORAL
  Filled 2013-12-14 (×23): qty 1

## 2013-12-14 NOTE — Progress Notes (Signed)
[facility] Individual Statement of Services  Patient Name:  Maria [name]  Date:  12/14/2013  Welcome to the [facility].  Our goal is to provide you with an individualized program based on your diagnosis and situation, designed to meet your specific needs.  With this comprehensive rehabilitation program, you will be expected to participate in at least 3 hours of rehabilitation therapies Monday-Friday, with modified therapy programming on the weekends.  Your rehabilitation program will include the following services:  Physical Therapy (PT), Occupational Therapy (OT), 24 hour per day rehabilitation nursing, Therapeutic Recreation (TR), Case Management (Social Worker), Rehabilitation Medicine, Nutrition Services and Pharmacy Services  Weekly team conferences will be held on Tuesdays to discuss your progress.  Your Social Worker will talk with you frequently to get your input and to update you on team discussions.  Team conferences with you and your family in attendance may also be held.  Expected length of stay:  2 to 2 1/2 weeks  Overall anticipated outcome:  Supervision with some minimal physical assistance needed  Depending on your progress and recovery, your program may change. Your Social Worker will coordinate services and will keep you informed of any changes. Your Social Worker's name and contact numbers are listed  below.  The following services may also be recommended but are not provided by the [facility] will be made to provide these services after discharge if needed.  Arrangements include referral to agencies that provide these services.  Your insurance has been verified to be:  Medicare and [place] Your primary doctor is:  Dr. [name]  Pertinent information will be shared with your doctor and your  insurance company.  Social Worker:  Alfonse Alpers, LCSW  [phone] or ([phone]  Information discussed with and copy given to patient by: Trey Sailors, 12/14/2013, 12:26 PM

## 2013-12-14 NOTE — Progress Notes (Signed)
Social Work Assessment and Plan  Patient Details  Name: Maria [name] MRN: [id number] Date of Birth: 1924-10-05  Today's Date: 12/14/2013  Problem List:  Patient Active Problem List   Diagnosis Date Noted  . Femoral neck fracture 12/12/2013  . Atonic bladder 12/11/2013  . Acute confusional state 12/11/2013  . Hyponatremia 12/08/2013  . Fall 12/08/2013  . Closed right hip fracture 12/07/2013  . UTI (lower urinary tract infection) 12/07/2013  . Hip fracture, right 12/07/2013  . Bradycardia 11/07/2013  . Anticoagulation goal of INR 2.5 to 3.5 11/07/2013  . Protein-calorie malnutrition, severe 10/04/2013  . DVT (deep venous thrombosis) 10/03/2013  . Pleural effusion 09/15/2013  . Hx of CABG 09/12/2013  . Malnutrition of mild degree 09/03/2013  . Respiratory failure, post-operative 08/29/2013  . Coronary atherosclerosis of native coronary artery 08/16/2013    Class: Acute  . Mitral regurgitation 08/07/2013  . Pulmonary hypertension, moderate to severe 08/07/2013  . Chronic diastolic heart failure 80/22/3361    Class: Chronic  . Essential hypertension, benign 08/02/2013  . Nontoxic uninodular goiter 08/02/2013  . Unspecified hereditary and idiopathic peripheral neuropathy 08/02/2013  . Occlusion and stenosis of carotid artery without mention of cerebral infarction 08/02/2013  . Unspecified vitamin D deficiency 08/02/2013  . Depressive disorder, not elsewhere classified 08/02/2013  . Paroxysmal atrial fibrillation 08/02/2013  . Trifascicular block 08/02/2013  . Gastric ulcer, unspecified as acute or chronic, without mention of hemorrhage, perforation, or obstruction 08/02/2013  . Sinoatrial node dysfunction 08/02/2013  . Other dyspnea and respiratory abnormality 08/02/2013  . Encounter for long-term (current) use of other medications 08/02/2013  . NONSPECIFIC ABNORMAL FIND RAD&OTH EXAM GI TRACT 05/30/2008   Past Medical History:  Past Medical History  Diagnosis Date  .  Osteoporosis   . Celiac disease   . Celiac disease   . Carotid artery occlusion     right carotid stenosis 40-60%, 4/08, neg for stenosis repeat study 11/11  . Depression   . Presbycusis   . Macular degeneration   . Urethral stricture   . Moderate aortic insufficiency   . Moderate mitral regurgitation by prior echocardiogram     echo 9/13   . Thyroid nodule     66m, no change 11/11  . RLS (restless legs syndrome)   . Vitamin D deficiency   . Heart murmur   . Pulmonary hypertension   . Hypertension     takes Metoprolol daily  . Atrial fibrillation   . Coronary artery disease   . CHF (congestive heart failure)     takes Lasix daily  . MVP (mitral valve prolapse)   . Moderate obstructive sleep apnea     uses CPAP;sleep study about 4-570monthago  . Hearing loss     both ears  . Numbness     left arm  . Joint swelling   . GERD (gastroesophageal reflux disease)     takes Omeprazole daily  . Gastric ulcer     6-38m64monthgo  . GI bleeding   . Constipation   . Diarrhea   . Hemorrhoids   . Urinary retention   . Urinary anastomotic stricture   . Hypothyroidism     takes Synthroid daily as result of AMiodarone  . Hot flashes     takes ZOloft 3 times a week  . Insomnia     takes Melatonin nightly  . Restless leg   . Peripheral edema     left  . DVT (deep venous thrombosis)   . Atonic  Social Work Assessment and Plan  Patient Details  Name: Maria [name] MRN: [id number] Date of Birth: 1924-10-05  Today's Date: 12/14/2013  Problem List:  Patient Active Problem List   Diagnosis Date Noted  . Femoral neck fracture 12/12/2013  . Atonic bladder 12/11/2013  . Acute confusional state 12/11/2013  . Hyponatremia 12/08/2013  . Fall 12/08/2013  . Closed right hip fracture 12/07/2013  . UTI (lower urinary tract infection) 12/07/2013  . Hip fracture, right 12/07/2013  . Bradycardia 11/07/2013  . Anticoagulation goal of INR 2.5 to 3.5 11/07/2013  . Protein-calorie malnutrition, severe 10/04/2013  . DVT (deep venous thrombosis) 10/03/2013  . Pleural effusion 09/15/2013  . Hx of CABG 09/12/2013  . Malnutrition of mild degree 09/03/2013  . Respiratory failure, post-operative 08/29/2013  . Coronary atherosclerosis of native coronary artery 08/16/2013    Class: Acute  . Mitral regurgitation 08/07/2013  . Pulmonary hypertension, moderate to severe 08/07/2013  . Chronic diastolic heart failure 80/22/3361    Class: Chronic  . Essential hypertension, benign 08/02/2013  . Nontoxic uninodular goiter 08/02/2013  . Unspecified hereditary and idiopathic peripheral neuropathy 08/02/2013  . Occlusion and stenosis of carotid artery without mention of cerebral infarction 08/02/2013  . Unspecified vitamin D deficiency 08/02/2013  . Depressive disorder, not elsewhere classified 08/02/2013  . Paroxysmal atrial fibrillation 08/02/2013  . Trifascicular block 08/02/2013  . Gastric ulcer, unspecified as acute or chronic, without mention of hemorrhage, perforation, or obstruction 08/02/2013  . Sinoatrial node dysfunction 08/02/2013  . Other dyspnea and respiratory abnormality 08/02/2013  . Encounter for long-term (current) use of other medications 08/02/2013  . NONSPECIFIC ABNORMAL FIND RAD&OTH EXAM GI TRACT 05/30/2008   Past Medical History:  Past Medical History  Diagnosis Date  .  Osteoporosis   . Celiac disease   . Celiac disease   . Carotid artery occlusion     right carotid stenosis 40-60%, 4/08, neg for stenosis repeat study 11/11  . Depression   . Presbycusis   . Macular degeneration   . Urethral stricture   . Moderate aortic insufficiency   . Moderate mitral regurgitation by prior echocardiogram     echo 9/13   . Thyroid nodule     66m, no change 11/11  . RLS (restless legs syndrome)   . Vitamin D deficiency   . Heart murmur   . Pulmonary hypertension   . Hypertension     takes Metoprolol daily  . Atrial fibrillation   . Coronary artery disease   . CHF (congestive heart failure)     takes Lasix daily  . MVP (mitral valve prolapse)   . Moderate obstructive sleep apnea     uses CPAP;sleep study about 4-570monthago  . Hearing loss     both ears  . Numbness     left arm  . Joint swelling   . GERD (gastroesophageal reflux disease)     takes Omeprazole daily  . Gastric ulcer     6-38m64monthgo  . GI bleeding   . Constipation   . Diarrhea   . Hemorrhoids   . Urinary retention   . Urinary anastomotic stricture   . Hypothyroidism     takes Synthroid daily as result of AMiodarone  . Hot flashes     takes ZOloft 3 times a week  . Insomnia     takes Melatonin nightly  . Restless leg   . Peripheral edema     left  . DVT (deep venous thrombosis)   . Atonic  Social Work Assessment and Plan  Patient Details  Name: Maria [name] MRN: [id number] Date of Birth: 1924-10-05  Today's Date: 12/14/2013  Problem List:  Patient Active Problem List   Diagnosis Date Noted  . Femoral neck fracture 12/12/2013  . Atonic bladder 12/11/2013  . Acute confusional state 12/11/2013  . Hyponatremia 12/08/2013  . Fall 12/08/2013  . Closed right hip fracture 12/07/2013  . UTI (lower urinary tract infection) 12/07/2013  . Hip fracture, right 12/07/2013  . Bradycardia 11/07/2013  . Anticoagulation goal of INR 2.5 to 3.5 11/07/2013  . Protein-calorie malnutrition, severe 10/04/2013  . DVT (deep venous thrombosis) 10/03/2013  . Pleural effusion 09/15/2013  . Hx of CABG 09/12/2013  . Malnutrition of mild degree 09/03/2013  . Respiratory failure, post-operative 08/29/2013  . Coronary atherosclerosis of native coronary artery 08/16/2013    Class: Acute  . Mitral regurgitation 08/07/2013  . Pulmonary hypertension, moderate to severe 08/07/2013  . Chronic diastolic heart failure 80/22/3361    Class: Chronic  . Essential hypertension, benign 08/02/2013  . Nontoxic uninodular goiter 08/02/2013  . Unspecified hereditary and idiopathic peripheral neuropathy 08/02/2013  . Occlusion and stenosis of carotid artery without mention of cerebral infarction 08/02/2013  . Unspecified vitamin D deficiency 08/02/2013  . Depressive disorder, not elsewhere classified 08/02/2013  . Paroxysmal atrial fibrillation 08/02/2013  . Trifascicular block 08/02/2013  . Gastric ulcer, unspecified as acute or chronic, without mention of hemorrhage, perforation, or obstruction 08/02/2013  . Sinoatrial node dysfunction 08/02/2013  . Other dyspnea and respiratory abnormality 08/02/2013  . Encounter for long-term (current) use of other medications 08/02/2013  . NONSPECIFIC ABNORMAL FIND RAD&OTH EXAM GI TRACT 05/30/2008   Past Medical History:  Past Medical History  Diagnosis Date  .  Osteoporosis   . Celiac disease   . Celiac disease   . Carotid artery occlusion     right carotid stenosis 40-60%, 4/08, neg for stenosis repeat study 11/11  . Depression   . Presbycusis   . Macular degeneration   . Urethral stricture   . Moderate aortic insufficiency   . Moderate mitral regurgitation by prior echocardiogram     echo 9/13   . Thyroid nodule     66m, no change 11/11  . RLS (restless legs syndrome)   . Vitamin D deficiency   . Heart murmur   . Pulmonary hypertension   . Hypertension     takes Metoprolol daily  . Atrial fibrillation   . Coronary artery disease   . CHF (congestive heart failure)     takes Lasix daily  . MVP (mitral valve prolapse)   . Moderate obstructive sleep apnea     uses CPAP;sleep study about 4-570monthago  . Hearing loss     both ears  . Numbness     left arm  . Joint swelling   . GERD (gastroesophageal reflux disease)     takes Omeprazole daily  . Gastric ulcer     6-38m64monthgo  . GI bleeding   . Constipation   . Diarrhea   . Hemorrhoids   . Urinary retention   . Urinary anastomotic stricture   . Hypothyroidism     takes Synthroid daily as result of AMiodarone  . Hot flashes     takes ZOloft 3 times a week  . Insomnia     takes Melatonin nightly  . Restless leg   . Peripheral edema     left  . DVT (deep venous thrombosis)   . Atonic

## 2013-12-14 NOTE — Progress Notes (Signed)
Subjective/Complaints: Moved bowels yesterday. Still having problems emptying bladder. Had spasms last night in right leg which kept her up. A 12 point review of systems has been performed and if not noted above is otherwise negative.   Objective: Vital Signs: Blood pressure 105/62, pulse 65, temperature 97.6 F (36.4 C), temperature source Oral, resp. rate 18, height 5' 1"  (1.549 m), weight 57 kg (125 lb 10.6 oz), SpO2 95.00%. No results found.  Recent Labs  12/12/13 0620 12/13/13 0613  WBC 10.0 10.9*  HGB 9.4* 9.5*  HCT 27.8* 28.5*  PLT 277 315    Recent Labs  12/13/13 0613 12/14/13 0444  NA 130* 130*  K 4.8 4.2  CL 96 96  GLUCOSE 105* 92  BUN 16 13  CREATININE 0.55 0.56  CALCIUM 8.6 8.3*   CBG (last 3)  No results found for this basename: GLUCAP,  in the last 72 hours  Wt Readings from Last 3 Encounters:  12/12/13 57 kg (125 lb 10.6 oz)  12/07/13 44.453 kg (98 lb)  12/07/13 44.453 kg (98 lb)    Physical Exam:  Constitutional: She is oriented to person, place, and time. No distress.  HENT:  Head: Normocephalic.  Eyes: EOM are normal.  Neck: Normal range of motion. Neck supple. No thyromegaly present.   Cardiovascular:  Cardiac rate controlled. No murmur. Chest incision clean/healed  Respiratory: Effort normal and breath sounds normal. No respiratory distress. No rales or wheezes  GI: Soft. Bowel sounds are normal. She exhibits no distension.  Neurological: She is alert and oriented to person, place, and time.  Musc: right leg quite tender.  Follows commands. Somewhat distracted.UE's 4+ to 5/5 at deltoid, bicep, tricep, HI. LLE 2HF, 3- KE and 4/5 with ADF/APF. RLE trace hip (pain), 1 KE, 3+ ADF/APF. Sensation appears grossly intact. DTR's 1+  Skin:  Hip incision dry, intact, dressed and  tender, 4cm blood blister on right heel which is tender to touch.   Assessment/Plan: 1. Functional deficits secondary to right femoral neck fracture which  require 3+ hours per day of interdisciplinary therapy in a comprehensive inpatient rehab setting. Physiatrist is providing close team supervision and 24 hour management of active medical problems listed below. Physiatrist and rehab team continue to assess barriers to discharge/monitor patient progress toward functional and medical goals. FIM: FIM - Bathing Bathing Steps Patient Completed: Chest;Right Arm;Left Arm;Abdomen;Front perineal area Bathing: 3: Mod-Patient completes 5-7 19f10 parts or 50-74%  FIM - Upper Body Dressing/Undressing Upper body dressing/undressing steps patient completed: Thread/unthread right bra strap;Thread/unthread left bra strap;Thread/unthread right sleeve of pullover shirt/dresss;Thread/unthread left sleeve of pullover shirt/dress;Put head through opening of pull over shirt/dress;Pull shirt over trunk Upper body dressing/undressing: 4: Min-Patient completed 75 plus % of tasks FIM - Lower Body Dressing/Undressing Lower body dressing/undressing: 1: Total-Patient completed less than 25% of tasks  FIM - Toileting Toileting: 1: Total-Patient completed zero steps, helper did all 3  FIM - TRadio producerDevices: Grab bars Toilet Transfers: 3-To toilet/BSC: Mod A (lift or lower assist);3-From toilet/BSC: Mod A (lift or lower assist)  FIM - Bed/Chair Transfer Bed/Chair Transfer: 2: Supine > Sit: Max A (lifting assist/Pt. 25-49%);2: Sit > Supine: Max A (lifting assist/Pt. 25-49%);2: Bed > Chair or W/C: Max A (lift and lower assist);2: Chair or W/C > Bed: Max A (lift and lower assist)  FIM - Locomotion: Ambulation Locomotion: Ambulation Assistive Devices: Walker - Rolling Ambulation/Gait Assistance: 3: Mod assist Locomotion: Ambulation: 1: TOwens [place]  less than 50 ft with moderate assistance (Pt: 50 - 74%)  Comprehension Comprehension Mode: Auditory Comprehension: 4-Understands basic 75 - 89% of the time/requires cueing 10 - 24% of the  time  Expression Expression Mode: Verbal Expression: 4-Expresses basic 75 - 89% of the time/requires cueing 10 - 24% of the time. Needs helper to occlude trach/needs to repeat words.  Social Interaction Social Interaction: 5-Interacts appropriately 90% of the time - Needs monitoring or encouragement for participation or interaction.  Problem Solving Problem Solving: 3-Solves basic 50 - 74% of the time/requires cueing 25 - 49% of the time  Memory Memory: 4-Recognizes or recalls 75 - 89% of the time/requires cueing 10 - 24% of the time  Medical Problem List and Plan:  1. Right femoral neck fracture secondary to fall. Status post hemiarthroplasty 12/09/2013 weightbearing as tolerated with posterior hip cautions  2. DVT Prophylaxis/Anticoagulation: Xarelto. Monitor for any signs of DVT  3. Pain Management: Zanaflex 2 mg 3 times a day,--will increase HS dose to 84m for HS spasms.  - Ultram 50 mg every 6 hours as  needed pain. May have to back off ultram if she continues to have hallucinations or AMS 4. Neuropsych: This patient is capable of making decisions on her own behalf.  5. Acute blood loss anemia. Latest hemoglobin 9.4. Followup CBC  6. SIADH. Sodium level up to 130.  1200 cc fluid restriction---and sodium chloride tablets  (CAN THESE BE STOPPED?)   - Continue low-dose Lasix as per nephrology services  7. Diastolic congestive heart failure. No signs of fluid overload. Continue low-dose diuretic  8. CAD/CABG. Followup cardiology services as needed. No chest pain or shortness of breath  9. Atrial fibrillation. Cardiac rate control. Continue xarelto  10. Hypothyroidism. Synthroid. Latest TSH level 2.175  11. Klebsiella/Escherichia coli UTI with urinary retention.  -continue levaquin  -I wish to continue voiding trial with cathing prn-  -needs to be up to void AT TOILET OR BSC  -moved bowels yesterday  -really not a candidate for urecholine or flomax given CV history, BP  12. Skin  breakdown right heel--  -elevate  -prevalon boot  LOS (Days) 2 A FACE TO FACE EVALUATION WAS PERFORMED  Kensley Valladares T 12/14/2013 8:13 AM

## 2013-12-14 NOTE — Progress Notes (Signed)
Occupational Therapy Session Note  Patient Details  Name: Maria [name] MRN: [id number] Date of Birth: [birth year]  Today's Date: 12/14/2013 Time: 1000-1100 Time Calculation (min): 60 min  Short Term Goals: Week 1:  OT Short Term Goal 1 (Week 1): Patient will complete toilet transfer with Min A OT Short Term Goal 2 (Week 1): Patient will perform upper body bathing and dressing, seated, with supervision and setup OT Short Term Goal 3 (Week 1): Patient will perform lower body bathing with steadying assist while standing OT Short Term Goal 4 (Week 1): Patient will complete lower body dressing using AE, prn, with mod assist OT Short Term Goal 5 (Week 1): Patient will complete toileting with mod assist  Skilled Therapeutic Interventions/Progress Updates: ADL-retraining at shower level with focus on transfers, pain management, and adherence to posterior hip precautions.   Patient was received in her w/c, receptive for bathing and dressing activity planned.   With extra time and max assist (lift and lower, pt = 50%) patient completed transfer to tub bench.   Patient completed bathing sitting, although able to partially stand supported, using grab bar and mod assist to maintain balance while assisted with washing buttocks.   Patient washed peri-area with mod assist to maintain balance while she held grab bar with left hand.   Patient required manual facilitation for placement of both legs during transfers and to shift weight prior to completing sit <> stand.    Patient completed upper body dressing in w/c with mod assist for bra.   Patient was unable to complete lower body dressing as RN arrived for continued care at end of time allotted for ADL.    Therapy Documentation Precautions:  Precautions Precautions: Posterior Hip;Fall Precaution Booklet Issued: Yes (comment) Precaution Comments: Pt recalls 2/3 precautions Required Braces or Orthoses: Other Brace/Splint Other Brace/Splint: Prevalon Boot  for Rt. Heel  Restrictions Weight Bearing Restrictions: Yes RLE Weight Bearing: Weight bearing as tolerated  Pain: Pain Assessment Pain Score: 4  Pain Type: Surgical pain Pain Location: Hip Pain Orientation: Right Pain Intervention(s): Medication (See eMAR)  ADL: ADL ADL Comments: see FIM  See FIM for current functional status  Therapy/Group: Individual Therapy  Second session: Time: 5035-4656 Time Calculation (min):  45 min  Pain Assessment: 5/10 at right hip  Skilled Therapeutic Interventions: ADL-retraining with focus on use of AE to improve performance with bed mobility and lower body dressing.   Patient was educated on use of leg lift strap and she demonstrated beginning skill development to progress right LE from middle of bed to edge of bed with only verbal cues and prompts to continue to progress.   Patient rose from supine to sitting at edge of bed with only mod assist this session, with HOB elevated and with use of bed rail.   While sitting at edge of bed, patient completed donning left sock using sock aid.   Patient's daughter was present throughout session and was advised to purchase "hip kit" to encourage skill development with AE.  See FIM for current functional status  Therapy/Group: Individual Therapy  [facility] 12/14/2013, 12:59 PM

## 2013-12-14 NOTE — Plan of Care (Signed)
Problem: RH BLADDER ELIMINATION Goal: RH STG MANAGE BLADDER WITH ASSISTANCE STG Manage Bladder With min ssistance  Outcome: Not Progressing I&O cath required. Pt unable to void.

## 2013-12-14 NOTE — Progress Notes (Signed)
Patient ID: Maria [name], female   DOB: 1924-07-02, 80 y.o.   MRN: [id number] S:feels a little better O:BP 105/62  Pulse 65  Temp(Src) 97.6 F (36.4 C) (Oral)  Resp 18  Ht 5' 1"  (1.549 m)  Wt 57 kg (125 lb 10.6 oz)  BMI 23.76 kg/m2  SpO2 95%  Intake/Output Summary (Last 24 hours) at 12/14/13 1408 Last data filed at 12/14/13 1300  Gross per 24 hour  Intake    480 ml  Output   1050 ml  Net   -570 ml   Intake/Output: I/O last 3 completed shifts: In: 360 [P.O.:360] Out: 1550 [Urine:1550]  Intake/Output this shift:  Total I/O In: 360 [P.O.:360] Out: 400 [Urine:400] Weight change:  Gen: frail, elderly WF in NAd CVS:no rub Resp:cta VXB:[id number] Ext:no edema   Recent Labs Lab 12/09/13 0158  12/09/13 1132 12/09/13 1941 12/10/13 1015 12/11/13 0413 12/12/13 0620 12/13/13 0613 12/14/13 0444  NA 127*  < > 130* 133* 129* 129* 126* 130* 130*  K 4.8  --  4.5  --  4.3 4.1 4.4 4.8 4.2  CL 94*  --  97  --  97 95* 95* 96 96  CO2 21  --  21  --  21 21 22 23 22   GLUCOSE 90  --  83  --  108* 103* 112* 105* 92  BUN 11  --  9  --  7 11 15 16 13   CREATININE 0.67  --  0.59  --  0.57 0.65 0.54 0.55 0.56  ALBUMIN  --   --   --   --   --   --   --  2.3* 2.1*  CALCIUM 8.8  --  8.9  --  8.3* 8.3* 8.3* 8.6 8.3*  AST  --   --   --   --   --   --   --  123* 94*  ALT  --   --   --   --   --   --   --  126* 105*  < > = values in this interval not displayed. Liver Function Tests:  Recent Labs Lab 12/13/13 0613 12/14/13 0444  AST 123* 94*  ALT 126* 105*  ALKPHOS 105 100  BILITOT 0.6 0.6  PROT 6.0 5.5*  ALBUMIN 2.3* 2.1*   No results found for this basename: LIPASE, AMYLASE,  in the last 168 hours No results found for this basename: AMMONIA,  in the last 168 hours CBC:  Recent Labs Lab 12/07/13 2028  12/09/13 0158 12/10/13 1015 12/11/13 0413 12/12/13 0620 12/13/13 0613  WBC 10.7*  < > 8.9 11.9* 12.4* 10.0 10.9*  NEUTROABS 8.6*  --   --   --   --   --  8.7*  HGB 11.1*  < >  10.6* 10.0* 9.0* 9.4* 9.5*  HCT 32.0*  < > 31.7* 30.3* 27.7* 27.8* 28.5*  MCV 79.8  < > 80.7 81.9 82.4 82.2 81.0  PLT 360  < > 315 315 290 277 315  < > = values in this interval not displayed. Cardiac Enzymes: No results found for this basename: CKTOTAL, CKMB, CKMBINDEX, TROPONINI,  in the last 168 hours CBG:  Recent Labs Lab 12/10/13 1622  GLUCAP 122*    Iron Studies: No results found for this basename: IRON, TIBC, TRANSFERRIN, FERRITIN,  in the last 72 hours Studies/Results: No results found. . docusate sodium  100 mg Oral BID  . furosemide  20 mg Oral Daily  .  levofloxacin  250 mg Oral Daily  . levothyroxine  50 mcg Oral QAC breakfast  . polyethylene glycol  17 g Oral Daily  . Rivaroxaban  20 mg Oral Q supper  . sodium chloride  2 g Oral TID WC  . [START ON 12/15/2013] tiZANidine  2 mg Oral BID  . tiZANidine  4 mg Oral QHS    BMET    Component Value Date/Time   NA 130* 12/14/2013 0444   K 4.2 12/14/2013 0444   CL 96 12/14/2013 0444   CO2 22 12/14/2013 0444   GLUCOSE 92 12/14/2013 0444   BUN 13 12/14/2013 0444   CREATININE 0.56 12/14/2013 0444   CALCIUM 8.3* 12/14/2013 0444   GFRNONAA 81* 12/14/2013 0444   GFRAA >90 12/14/2013 0444   CBC    Component Value Date/Time   WBC 10.9* 12/13/2013 0613   RBC 3.52* 12/13/2013 0613   HGB 9.5* 12/13/2013 0613   HCT 28.5* 12/13/2013 0613   PLT 315 12/13/2013 0613   MCV 81.0 12/13/2013 0613   MCH 27.0 12/13/2013 0613   MCHC 33.3 12/13/2013 0613   RDW 16.9* 12/13/2013 0613   LYMPHSABS 1.1 12/13/2013 0613   MONOABS 0.8 12/13/2013 0613   EOSABS 0.3 12/13/2013 0613   BASOSABS 0.0 12/13/2013 0613     Assessment/Plan:  1. Hyponatremia- likely SIADH (possibly due to SSRI) but also on DDx would be reset osmostat and/or adrenal insufficiency (low cortisol of 7.5). Sodium had improved with samsca and now doing well with lasix 35m daily, salt tabs, and free water restriction  1. Cont with fluid restriction and salt tabs and lasix 2. Stressed the  importance of increasing her protein intake 3. continue to monitor labs 2. R hip fx- s/p THA on 12/09/13 3. CHF (diastolic)- euvolemic 4. Constipation- ok to give MOM as she reports this has helped in the past  1. Consider abd series to r/o obstruction given persistent N/V/constipation 2. Reports having a BM and better appetite today 5. Urinary retention- cont with prn intermittent I&O caths 6. Abnormal LFT's- cont to follow 7. HTN- low, holding meds. 8. CAD s/p CABG 11/14 9. LLE DVT 12/14 10. Hypothyroidism- TSH 2.175 11. ABLA- follow h/h 12. Dispo- s/p transfer to CIR 13. Will see her again on Monday, please call if anything changes until [place]

## 2013-12-14 NOTE — IPOC Note (Signed)
Overall Plan of Care [facility]) Patient Details Name: Maria [name] MRN: [id number] DOB: [birth year]  Admitting Diagnosis: [facility] FX  Hospital Problems: Active Problems:   Femoral neck fracture     Functional Problem List: Nursing Bladder;Bowel;Medication Management;Skin Integrity;Pain  PT Balance;Endurance;Pain;Safety;Sensory;Skin Integrity  OT Balance;Cognition;Endurance;Pain;Behavior  SLP    TR         Basic ADL's: OT Bathing;Dressing;Toileting     Advanced  ADL's: OT       Transfers: PT Bed Mobility;Bed to Chair;Car;Furniture  OT Toilet;Tub/Shower     Locomotion: PT Ambulation;Wheelchair Mobility     Additional Impairments: OT None  SLP        TR      Anticipated Outcomes Item Anticipated Outcome  Self Feeding Mod I  Swallowing      Basic self-care  Min A  Toileting  Supervision   Bathroom Transfers Supervision  Bowel/Bladder  Mod - Max  Transfers  supervision  Locomotion  supervision  Communication     Cognition     Pain  < 3  Safety/Judgment  Min Assist   Therapy Plan: PT Intensity: Minimum of 1-2 x/day ,45 to 90 minutes PT Frequency: 5 out of 7 days PT Duration Estimated Length of Stay: 2-2.5 weeks OT Intensity: Minimum of 1-2 x/day, 45 to 90 minutes OT Frequency: 5 out of 7 days OT Duration/Estimated Length of Stay: 15-18 days         Team Interventions: Nursing Interventions Patient/Family Education;Bladder Management;Bowel Management;Pain Management;Medication Management;Skin Care/Wound Management;Discharge Planning  PT interventions Ambulation/gait training;Balance/vestibular training;Cognitive remediation/compensation;Disease management/prevention;Discharge planning;DME/adaptive equipment instruction;Neuromuscular re-education;Psychosocial support;Pain management;Skin care/wound management;Functional mobility training;Patient/family education;Splinting/orthotics;Therapeutic Exercise;UE/LE Coordination activities;Therapeutic  Activities;UE/LE Strength taining/ROM;Wheelchair propulsion/positioning;Stair training  OT Interventions Therapeutic Activities;Self Care/advanced ADL retraining;Pain management;Functional mobility training;Patient/family education;Therapeutic Exercise;UE/LE Strength taining/ROM;DME/adaptive equipment instruction;Cognitive remediation/compensation;Discharge planning;Balance/vestibular training;Wheelchair propulsion/positioning  SLP Interventions    TR Interventions    SW/CM Interventions Discharge Planning;Psychosocial Support;Patient/Family Education    Team Discharge Planning: Destination: PT-Assisted Living ,OT- Home , SLP-  Projected Follow-up: PT-Home health PT;24 hour supervision/assistance;Other (comment), OT-  Home health OT, SLP-  Projected Equipment Needs: PT-To be determined, OT- To be determined, SLP-  Equipment Details: PT- , OT-  Patient/family involved in discharge planning: PT- Patient,  OT-Patient, SLP-   MD ELOS: 15-18 days Medical Rehab Prognosis:  Excellent Assessment: The patient has been admitted for CIR therapies. The team will be addressing, functional mobility, strength, stamina, balance, safety, adaptive techniques/equipment, self-care, bowel and bladder mgt, patient and caregiver education, hip/ortho precautions, pain mgt, skin care. Goals have been set at supervision for most mobility and min assist for self-care. We are hopeful that bladder function will begin to improve with increased mobility.    Meredith Staggers, MD, FAAPMR      See Team Conference Notes for weekly updates to the plan of care

## 2013-12-14 NOTE — Plan of Care (Signed)
Problem: RH BLADDER ELIMINATION Goal: RH STG MANAGE BLADDER WITH EQUIPMENT WITH ASSISTANCE STG Manage Bladder With Equipment With min Assistance  Outcome: Not Progressing I&O cath required-staff to cath pt

## 2013-12-14 NOTE — Progress Notes (Signed)
Physical Therapy Session Note  Patient Details  Name: Maria [name] MRN: [id number] Date of Birth: [birth year]  Today's Date: 12/14/2013 Time: 0730-0830 Time Calculation (min): 60 min  Short Term Goals: Week 1:  PT Short Term Goal 1 (Week 1): Pt will perform sit <> stands with min assist from bed or w/c.  PT Short Term Goal 2 (Week 1): Pt will transfer bed <> w/c with moderate assistance and LRAD PT Short Term Goal 3 (Week 1): Pt will ambulate x 30' with RW and min assist.   Skilled Therapeutic Interventions/Progress Updates:    Pain: pt grimaces with most movements, requested medication Pt with NT at start - reports need to go to bathroom. Sit <> stands mod/max assist this session with assist for anterior weight shift into standing, stand pivot with mod assist and RW. Squat pivot to toilet, stand pivot from toilet with RW both with mod assist and step by step verbal cues for sequencing and safety.  Standing static balance while therapist assisted with cleaning and clothing, min assist. W/c propulsion x 50' min assist particularly for turns. Pt adamant rollater better, more stabel than RW because it locks and she did not have trouble with it before admission, unable to convince pt otherwise. Trail rollator vs. RW x 6' each, pt much less stable with rollator and reports she now understands that she needs to use RW. Mod assist with gait.    Impaired carryover intersession, requires cues each stand for UE/LE positioning.   Second Session Time:  1430-1500 Time Calculation (min): 30 min Skilled Therapeutic Interventions/Progress Updates:  Pain: Soreness of Rt heel - RN has ordered bandage for heel Pt very concerned about heel. Discussed with RN who has ordered bandage. Reports busy day with therapies and visitors - very fatigued. PT recommended pt hang sign for visiting hours or not have visitors however pt reports she would rather have less therapy. PT explained that therapy is primary focus  while on rehab and overall therapy will not change without medical reason. Pt verbalizes understanding.    Pt requires encouragement for activity but able to ambulate x 35' with RW and min/mod assist - step-to gait PT cues for step by step sequencing. Pt does not place Rt heel down during gait secondary to pain.   Therapy Documentation Precautions:  Precautions Precautions: Posterior Hip;Fall Precaution Booklet Issued: Yes (comment) Precaution Comments: Pt recalls 2/3 precautions Required Braces or Orthoses: Other Brace/Splint Other Brace/Splint: Prevalon Boot for Rt. Heel  Restrictions Weight Bearing Restrictions: Yes RLE Weight Bearing: Weight bearing as tolerated   See FIM for current functional status  Therapy/Group: Individual Therapy both sessions  Lahoma Rocker 12/14/2013, 12:47 PM

## 2013-12-15 ENCOUNTER — Inpatient Hospital Stay (HOSPITAL_COMMUNITY): Payer: Medicare Other | Admitting: Physical Therapy

## 2013-12-15 ENCOUNTER — Inpatient Hospital Stay (HOSPITAL_COMMUNITY): Payer: Medicare Other | Admitting: *Deleted

## 2013-12-15 ENCOUNTER — Inpatient Hospital Stay (HOSPITAL_COMMUNITY): Payer: Medicare Other

## 2013-12-15 DIAGNOSIS — K5900 Constipation, unspecified: Secondary | ICD-10-CM | POA: Diagnosis not present

## 2013-12-15 LAB — COMPREHENSIVE METABOLIC PANEL
ALT: 119 U/L — ABNORMAL HIGH (ref 0–35)
AST: 121 U/L — AB (ref 0–37)
Albumin: 2.2 g/dL — ABNORMAL LOW (ref 3.5–5.2)
Alkaline Phosphatase: 116 U/L (ref 39–117)
BUN: 12 mg/dL (ref 6–23)
CHLORIDE: 97 meq/L (ref 96–112)
CO2: 25 mEq/L (ref 19–32)
Calcium: 8.6 mg/dL (ref 8.4–10.5)
Creatinine, Ser: 0.5 mg/dL (ref 0.50–1.10)
GFR calc Af Amer: >90 mL/min (ref >90)
GFR, EST NON AFRICAN AMERICAN: 85 mL/min — AB (ref >90)
Glucose, Bld: 94 mg/dL (ref 70–99)
Potassium: 4.4 mEq/L (ref 3.7–5.3)
Sodium: 131 mEq/L — ABNORMAL LOW (ref 137–147)
Total Bilirubin: 0.6 mg/dL (ref 0.3–1.2)
Total Protein: 5.6 g/dL — ABNORMAL LOW (ref 6.0–8.3)

## 2013-12-15 MED ORDER — SORBITOL 70 % SOLN
60.0000 mL | Status: AC
  Administered 2013-12-15: 60 mL via ORAL
  Filled 2013-12-15: qty 60

## 2013-12-15 NOTE — Progress Notes (Signed)
Subjective/Complaints: Still not having much success with bladder. Doesn't really have much urge to void. Ok with I/O caths. Hasn't moved her bowels since thursday A 12 point review of systems has been performed and if not noted above is otherwise negative.   Objective: Vital Signs: Blood pressure 110/67, pulse 57, temperature 97.4 F (36.3 C), temperature source Oral, resp. rate 17, height 5' 1"  (1.549 m), weight 57 kg (125 lb 10.6 oz), SpO2 97.00%. No results found.  Recent Labs  12/13/13 0613  WBC 10.9*  HGB 9.5*  HCT 28.5*  PLT 315    Recent Labs  12/14/13 0444 12/15/13 0529  NA 130* 131*  K 4.2 4.4  CL 96 97  GLUCOSE 92 94  BUN 13 12  CREATININE 0.56 0.50  CALCIUM 8.3* 8.6   CBG (last 3)  No results found for this basename: GLUCAP,  in the last 72 hours  Wt Readings from Last 3 Encounters:  12/12/13 57 kg (125 lb 10.6 oz)  12/07/13 44.453 kg (98 lb)  12/07/13 44.453 kg (98 lb)    Physical Exam:  Constitutional: She is oriented to person, place, and time. No distress.  HENT:  Head: Normocephalic.  Eyes: EOM are normal.  Neck: Normal range of motion. Neck supple. No thyromegaly present.   Cardiovascular:  Cardiac rate controlled. No murmur. Chest incision clean/healed  Respiratory: Effort normal and breath sounds normal. No respiratory distress. No rales or wheezes  GI: Soft. Bowel sounds are normal. She exhibits no real distension.  Neurological: She is alert and oriented to person, place, and time.  Musc: right leg quite tender.  Follows commands. Somewhat distracted.UE's 4+ to 5/5 at deltoid, bicep, tricep, HI. LLE 2HF, 3- KE and 4/5 with ADF/APF. RLE trace hip (pain), 1 KE, 3+ ADF/APF. Sensation appears grossly intact. DTR's 1+  Skin:  Hip incision dry, intact, dressed , 4cm blood blister on right heel which is tender to touch.   Assessment/Plan: 1. Functional deficits secondary to right femoral neck fracture which require 3+ hours per  day of interdisciplinary therapy in a comprehensive inpatient rehab setting. Physiatrist is providing close team supervision and 24 hour management of active medical problems listed below. Physiatrist and rehab team continue to assess barriers to discharge/monitor patient progress toward functional and medical goals. FIM: FIM - Bathing Bathing Steps Patient Completed: Chest;Right Arm;Left Arm;Abdomen;Front perineal area Bathing: 3: Mod-Patient completes 5-7 23f10 parts or 50-74%  FIM - Upper Body Dressing/Undressing Upper body dressing/undressing steps patient completed: Thread/unthread right bra strap;Thread/unthread left bra strap;Thread/unthread right sleeve of pullover shirt/dresss;Thread/unthread left sleeve of pullover shirt/dress;Put head through opening of pull over shirt/dress;Pull shirt over trunk Upper body dressing/undressing: 4: Min-Patient completed 75 plus % of tasks FIM - Lower Body Dressing/Undressing Lower body dressing/undressing: 1: Total-Patient completed less than 25% of tasks  FIM - Toileting Toileting: 1: Total-Patient completed zero steps, helper did all 3  FIM - TRadio producerDevices: Grab bars Toilet Transfers: 3-To toilet/BSC: Mod A (lift or lower assist);3-From toilet/BSC: Mod A (lift or lower assist)  FIM - Bed/Chair Transfer Bed/Chair Transfer: 2: Supine > Sit: Max A (lifting assist/Pt. 25-49%);3: Bed > Chair or W/C: Mod A (lift or lower assist);3: Chair or W/C > Bed: Mod A (lift or lower assist)  FIM - Locomotion: Wheelchair Locomotion: Wheelchair: 1: Travels less than 50 ft with moderate assistance (Pt: 50 - 74%) FIM - Locomotion: Ambulation Locomotion: Ambulation Assistive Devices: WAdministrator  Ambulation/Gait Assistance: 3: Mod assist Locomotion: Ambulation: 1: Travels less than 50 ft with moderate assistance (Pt: 50 - 74%)  Comprehension Comprehension Mode: Auditory Comprehension: 5-Understands basic 90% of the  time/requires cueing < 10% of the time  Expression Expression Mode: Verbal Expression: 5-Expresses complex 90% of the time/cues < 10% of the time  Social Interaction Social Interaction: 5-Interacts appropriately 90% of the time - Needs monitoring or encouragement for participation or interaction.  Problem Solving Problem Solving: 4-Solves basic 75 - 89% of the time/requires cueing 10 - 24% of the time  Memory Memory: 4-Recognizes or recalls 75 - 89% of the time/requires cueing 10 - 24% of the time  Medical Problem List and Plan:  1. Right femoral neck fracture secondary to fall. Status post hemiarthroplasty 12/09/2013 weightbearing as tolerated with posterior hip cautions  2. DVT Prophylaxis/Anticoagulation: Xarelto. Monitor for any signs of DVT  3. Pain Management: Zanaflex 2 mg 3 times a day,--will increase HS dose to 94m for HS spasms.  - Ultram 50 mg every 6 hours as  needed pain. May have to back off ultram if she continues to have hallucinations or AMS 4. Neuropsych: This patient is capable of making decisions on her own behalf.  5. Acute blood loss anemia. Latest hemoglobin 9.4. Followup CBC  6. SIADH. Sodium level up to 131.  1200 cc fluid restriction continues   - Continue low-dose Lasix  per nephrology   7. Diastolic congestive heart failure. No signs of fluid overload. Continue low-dose diuretic  8. CAD/CABG. Followup cardiology services as needed. No chest pain or shortness of breath  9. Atrial fibrillation. Cardiac rate control. Continue xarelto  10. Hypothyroidism. Synthroid. Latest TSH level 2.175  11. Klebsiella/Escherichia coli UTI with urinary retention.  -continue levaquin  -I wish to continue voiding trial with cathing prn-  -needs to be up to void AT TOILET OR BSC---timed voids  -moved bowels on 2/19   - not a candidate for urecholine or flomax given CV history, BP  12. Skin breakdown right heel--  -elevate  -prevalon boot 13. Constipation:  -check KUB  today  -repeat sorbitol today  LOS (Days) 3 A FACE TO FACE EVALUATION WAS PERFORMED  SWARTZ,ZACHARY T 12/15/2013 8:17 AM

## 2013-12-15 NOTE — Progress Notes (Signed)
KUB completed on pt. Per pt report large BM after sorbitol given this a.m, prior to receiving KUB. Soap Suds enema not given per pt request. Soap suds will be given this shift by evening shift. Continue plan of care  Kaeden Depaz, RN

## 2013-12-15 NOTE — Progress Notes (Signed)
Physical Therapy Session Note  Patient Details  Name: Maria [name] MRN: [id number] Date of Birth: Oct 21, 1924  Today's Date: 12/15/2013 Time: 0630-1601 Time Calculation (min): 45 min  Short Term Goals: Week 1:  PT Short Term Goal 1 (Week 1): Pt will perform sit <> stands with min assist from bed or w/c.  PT Short Term Goal 2 (Week 1): Pt will transfer bed <> w/c with moderate assistance and LRAD PT Short Term Goal 3 (Week 1): Pt will ambulate x 30' with RW and min assist.   Skilled Therapeutic Interventions/Progress Updates:  Pt was seen bedside in the am. Pt transferred supine to edge of bed with max A, side rail and verbal cues. Pt transferred edge of bed to w/c with rolling walker and mod A. Pt propelled w/c about 50 feet with B UEs and occasional min A. Pt ambulated with rolling walker, 50 feet with mod A and verbal cues.   Therapy Documentation Precautions:  Precautions Precautions: Posterior Hip;Fall Precaution Booklet Issued: Yes (comment) Precaution Comments: Pt recalls 2/3 precautions Required Braces or Orthoses: Other Brace/Splint Other Brace/Splint: Prevalon Boot for Rt. Heel  Restrictions Weight Bearing Restrictions: Yes RLE Weight Bearing: Weight bearing as tolerated General:   Pain: Pt c/o increased tension in R thigh muscles.   Locomotion : Ambulation Ambulation/Gait Assistance: 3: Mod assist   See FIM for current functional status  Therapy/Group: Individual Therapy  Dub Amis 12/15/2013, 3:15 PM

## 2013-12-15 NOTE — Progress Notes (Signed)
Occupational Therapy Note  Patient Details  Name: Maria [name] MRN: [id number] Date of Birth: 07-21-1924 Today's Date: 12/15/2013  Time:1045-1130  (45 min)  1st session Pain: 4/10 Right hip Individual session 1st session:  Engaged in hip precautionss, turning to pivot to bed without turning RLE inward.   Discussed hip precautions and demonstrated in detail so patient had thorough understanding.  Pt. Went from sit to stand with minimal assist and stood with SBA.  Left pt in wc for lunch and call bell in reach.     2nd session:    Time:1300-1345 Pain:  4/10  Right  Individual session Addressed hip precautions, stand pivot transfer from bed to wc with increased time for turning.  Applied foot positioner for better alignment.  Left pt in wc at sink to finsih grooming   Lisa Roca 12/15/2013, 11:29 AM

## 2013-12-15 NOTE — Plan of Care (Signed)
Problem: RH BOWEL ELIMINATION Goal: RH STG MANAGE BOWEL WITH ASSISTANCE STG Manage Bowel with min Assistance.  Outcome: Not Progressing Required sorbitol to manage bowels  Goal: RH STG MANAGE BOWEL W/EQUIPMENT W/ASSISTANCE STG Manage Bowel With Equipment With min Assistance  Outcome: Not Progressing Max assist

## 2013-12-15 NOTE — Progress Notes (Signed)
Physical Therapy Session Note  Patient Details  Name: Maria [name] MRN: [id number] Date of Birth: Dec 09, 1923  Today's Date: 12/15/2013 Time: 1410-1510 Time Calculation (min): 60 min  Short Term Goals: Week 1:  PT Short Term Goal 1 (Week 1): Pt will perform sit <> stands with min assist from bed or w/c.  PT Short Term Goal 2 (Week 1): Pt will transfer bed <> w/c with moderate assistance and LRAD PT Short Term Goal 3 (Week 1): Pt will ambulate x 30' with RW and min assist.   Skilled Therapeutic Interventions/Progress Updates:  Pt propelled w/c with B UEs, 100 feet with occasional min A. Pt transferred w/c to mat with rolling walker and mod A. Pt transferred edge of mat to supine with mod A. Pt performed LE exercises in supine for LE Strengthening and flexibility. Pt transferred supine to edge of mat with max A. Pt transferred edge of bed to w/c with rolling walker and mod A with verbal cues.   Therapy Documentation Precautions:  Precautions Precautions: Posterior Hip;Fall Precaution Booklet Issued: Yes (comment) Precaution Comments: Pt recalls 2/3 precautions Required Braces or Orthoses: Other Brace/Splint Other Brace/Splint: Prevalon Boot for Rt. Heel  Restrictions Weight Bearing Restrictions: Yes RLE Weight Bearing: Weight bearing as tolerated General:   Pain: No c/o pain.   Locomotion : Ambulation Ambulation/Gait Assistance: 3: Mod assist   See FIM for current functional status  Therapy/Group: Individual Therapy  Dub Amis 12/15/2013, 3:19 PM

## 2013-12-16 LAB — COMPREHENSIVE METABOLIC PANEL
ALK PHOS: 111 U/L (ref 39–117)
ALT: 111 U/L — ABNORMAL HIGH (ref 0–35)
AST: 102 U/L — ABNORMAL HIGH (ref 0–37)
Albumin: 2.2 g/dL — ABNORMAL LOW (ref 3.5–5.2)
BUN: 11 mg/dL (ref 6–23)
CO2: 25 mEq/L (ref 19–32)
CREATININE: 0.57 mg/dL (ref 0.50–1.10)
Calcium: 8.3 mg/dL — ABNORMAL LOW (ref 8.4–10.5)
Chloride: 94 mEq/L — ABNORMAL LOW (ref 96–112)
GFR calc Af Amer: >90 mL/min (ref >90)
GFR calc non Af Amer: 81 mL/min — ABNORMAL LOW (ref >90)
Glucose, Bld: 96 mg/dL (ref 70–99)
POTASSIUM: 4.2 meq/L (ref 3.7–5.3)
Sodium: 127 mEq/L — ABNORMAL LOW (ref 137–147)
TOTAL PROTEIN: 5.4 g/dL — AB (ref 6.0–8.3)
Total Bilirubin: 0.6 mg/dL (ref 0.3–1.2)

## 2013-12-16 NOTE — Progress Notes (Addendum)
Subjective/Complaints: Moved bowels yesterday. Concerned about Right leg swelling. Pain under fair control. Bladder still not emptying.  A 12 point review of systems has been performed and if not noted above is otherwise negative.   Objective: Vital Signs: Blood pressure 113/62, pulse 58, temperature 97.3 F (36.3 C), temperature source Oral, resp. rate 18, height 5' 1"  (1.549 m), weight 57 kg (125 lb 10.6 oz), SpO2 96.00%. Dg Abd 1 View  12/15/2013   CLINICAL DATA:  Abdominal pain and distention.  Constipation.  EXAM: ABDOMEN - 1 VIEW  COMPARISON:  09/13/12  FINDINGS: Generalized gaseous distention of colon is seen on today's study, suspicious for mild ileus. No evidence of dilated small bowel loops. Small bilateral pleural effusions incidentally noted.  IMPRESSION: Probable mild colonic ileus.   Electronically Signed   By: Earle Gell M.D.   On: 12/15/2013 18:41   No results found for this basename: WBC, HGB, HCT, PLT,  in the last 72 hours  Recent Labs  12/15/13 0529 12/16/13 0702  NA 131* 127*  K 4.4 4.2  CL 97 94*  GLUCOSE 94 96  BUN 12 11  CREATININE 0.50 0.57  CALCIUM 8.6 8.3*   CBG (last 3)  No results found for this basename: GLUCAP,  in the last 72 hours  Wt Readings from Last 3 Encounters:  12/12/13 57 kg (125 lb 10.6 oz)  12/07/13 44.453 kg (98 lb)  12/07/13 44.453 kg (98 lb)    Physical Exam:  Constitutional: She is oriented to person, place, and time. No distress.  HENT:  Head: Normocephalic.  Eyes: EOM are normal.  Neck: Normal range of motion. Neck supple. No thyromegaly present.   Cardiovascular:  Cardiac rate controlled. No murmur. Chest incision clean/healed  Respiratory: Effort normal and breath sounds normal. No respiratory distress. No rales or wheezes  GI: Soft. Bowel sounds are normal. She exhibits no real distension.  Neurological: She is alert and oriented to person, place, and time.  Musc: right leg quite tender.  Follows  commands. Somewhat distracted.UE's 4+ to 5/5 at deltoid, bicep, tricep, HI. LLE 2HF, 3- KE and 4/5 with ADF/APF. RLE trace hip (pain), 1 KE, 3+ ADF/APF. Sensation appears grossly intact. DTR's 1+  Skin:  Hip incision dry, intact, dressed , 4cm blood blister on right heel which is tender to touch.   Assessment/Plan: 1. Functional deficits secondary to right femoral neck fracture which require 3+ hours per day of interdisciplinary therapy in a comprehensive inpatient rehab setting. Physiatrist is providing close team supervision and 24 hour management of active medical problems listed below. Physiatrist and rehab team continue to assess barriers to discharge/monitor patient progress toward functional and medical goals. FIM: FIM - Bathing Bathing Steps Patient Completed: Chest;Right Arm;Left Arm;Abdomen;Front perineal area Bathing: 3: Mod-Patient completes 5-7 37f10 parts or 50-74%  FIM - Upper Body Dressing/Undressing Upper body dressing/undressing steps patient completed: Thread/unthread right bra strap;Thread/unthread left bra strap;Thread/unthread right sleeve of pullover shirt/dresss;Thread/unthread left sleeve of pullover shirt/dress;Put head through opening of pull over shirt/dress;Pull shirt over trunk Upper body dressing/undressing: 4: Min-Patient completed 75 plus % of tasks FIM - Lower Body Dressing/Undressing Lower body dressing/undressing: 1: Total-Patient completed less than 25% of tasks  FIM - Toileting Toileting: 1: Total-Patient completed zero steps, helper did all 3  FIM - TRadio producerDevices: Grab bars Toilet Transfers: 3-To toilet/BSC: Mod A (lift or lower assist);3-From toilet/BSC: Mod A (lift or lower assist)  FIM -  Bed/Chair Financial planner Devices: Arm rests;Bed rails Bed/Chair Transfer: 2: Supine > Sit: Max A (lifting assist/Pt. 25-49%);2: Sit > Supine: Max A (lifting assist/Pt. 25-49%);2: Bed > Chair or W/C: Max  A (lift and lower assist);2: Chair or W/C > Bed: Max A (lift and lower assist)  FIM - Locomotion: Wheelchair Locomotion: Wheelchair: 2: Travels 50 - 149 ft with minimal assistance (Pt.>75%) FIM - Locomotion: Ambulation Locomotion: Ambulation Assistive Devices: Administrator Ambulation/Gait Assistance: 3: Mod assist Locomotion: Ambulation: 2: Travels 50 - 149 ft with moderate assistance (Pt: 50 - 74%)  Comprehension Comprehension Mode: Auditory Comprehension: 5-Understands complex 90% of the time/Cues < 10% of the time  Expression Expression Mode: Verbal Expression: 5-Expresses complex 90% of the time/cues < 10% of the time  Social Interaction Social Interaction: 5-Interacts appropriately 90% of the time - Needs monitoring or encouragement for participation or interaction.  Problem Solving Problem Solving: 4-Solves basic 75 - 89% of the time/requires cueing 10 - 24% of the time  Memory Memory: 4-Recognizes or recalls 75 - 89% of the time/requires cueing 10 - 24% of the time  Medical Problem List and Plan:  1. Right femoral neck fracture secondary to fall. Status post hemiarthroplasty 12/09/2013 weightbearing as tolerated with posterior hip cautions  2. DVT/ Anticoagulation: Xarelto. Has history of RLE DVT  -elevate right leg and compress  3. Pain Management: Zanaflex 2 mg 3 times a day,--will increase HS dose to 74m for HS spasms.  - Ultram 50 mg every 6 hours as  needed pain. May have to back off ultram if she continues to have hallucinations or AMS 4. Neuropsych: This patient is capable of making decisions on her own behalf.  5. Acute blood loss anemia. Latest hemoglobin 9.4. Followup CBC  6. SIADH. Sodium level to 127 today.  1200 cc fluid restriction continues   - Continue low-dose Lasix  per nephrology   7. Diastolic congestive heart failure. No signs of fluid overload. Continue low-dose diuretic  8. CAD/CABG. Followup cardiology services as needed. No chest pain or  shortness of breath  9. Atrial fibrillation. Cardiac rate control. Continue xarelto  10. Hypothyroidism. Synthroid. Latest TSH level 2.175  11. Klebsiella/Escherichia coli UTI with urinary retention.  -continue levaquin  -I wish to continue voiding trial with cathing prn-  -needs to be up to void AT TOILET OR BSC---timed voids  -moved bowels on 2/19   - not a candidate for urecholine or flomax given CV history, BP  12. Skin breakdown right heel--   -elevate   -prevalon boot 13. Constipation:  -KUB with stool yesterday  -+results with sorbitol   LOS (Days) 4 A FACE TO FACE EVALUATION WAS PERFORMED  Poppy Mcafee T 12/16/2013 10:03 AM

## 2013-12-17 ENCOUNTER — Ambulatory Visit (HOSPITAL_COMMUNITY): Payer: Medicare Other

## 2013-12-17 ENCOUNTER — Inpatient Hospital Stay (HOSPITAL_COMMUNITY): Payer: Medicare Other | Admitting: Occupational Therapy

## 2013-12-17 ENCOUNTER — Encounter (HOSPITAL_COMMUNITY): Payer: Medicare Other | Admitting: Occupational Therapy

## 2013-12-17 ENCOUNTER — Inpatient Hospital Stay (HOSPITAL_COMMUNITY): Payer: Medicare Other | Admitting: Physical Therapy

## 2013-12-17 DIAGNOSIS — S72009A Fracture of unspecified part of neck of unspecified femur, initial encounter for closed fracture: Secondary | ICD-10-CM

## 2013-12-17 DIAGNOSIS — I4891 Unspecified atrial fibrillation: Secondary | ICD-10-CM

## 2013-12-17 DIAGNOSIS — I25810 Atherosclerosis of coronary artery bypass graft(s) without angina pectoris: Secondary | ICD-10-CM

## 2013-12-17 DIAGNOSIS — W010XXA Fall on same level from slipping, tripping and stumbling without subsequent striking against object, initial encounter: Secondary | ICD-10-CM

## 2013-12-17 DIAGNOSIS — D62 Acute posthemorrhagic anemia: Secondary | ICD-10-CM

## 2013-12-17 DIAGNOSIS — Z4889 Encounter for other specified surgical aftercare: Secondary | ICD-10-CM

## 2013-12-17 LAB — COMPREHENSIVE METABOLIC PANEL
ALK PHOS: 144 U/L — AB (ref 39–117)
ALT: 111 U/L — AB (ref 0–35)
AST: 105 U/L — ABNORMAL HIGH (ref 0–37)
Albumin: 2.3 g/dL — ABNORMAL LOW (ref 3.5–5.2)
BUN: 9 mg/dL (ref 6–23)
CO2: 26 meq/L (ref 19–32)
Calcium: 8.6 mg/dL (ref 8.4–10.5)
Chloride: 97 mEq/L (ref 96–112)
Creatinine, Ser: 0.54 mg/dL (ref 0.50–1.10)
GFR, EST NON AFRICAN AMERICAN: 82 mL/min — AB (ref >90)
GLUCOSE: 96 mg/dL (ref 70–99)
POTASSIUM: 5.3 meq/L (ref 3.7–5.3)
Sodium: 130 mEq/L — ABNORMAL LOW (ref 137–147)
Total Bilirubin: 0.4 mg/dL (ref 0.3–1.2)
Total Protein: 5.7 g/dL — ABNORMAL LOW (ref 6.0–8.3)

## 2013-12-17 MED ORDER — SENNOSIDES-DOCUSATE SODIUM 8.6-50 MG PO TABS
2.0000 | ORAL_TABLET | Freq: Every day | ORAL | Status: DC
  Administered 2013-12-17 – 2013-12-20 (×4): 2 via ORAL
  Filled 2013-12-17 (×4): qty 2

## 2013-12-17 NOTE — Plan of Care (Signed)
Problem: RH BOWEL ELIMINATION Goal: RH STG MANAGE BOWEL W/MEDICATION W/ASSISTANCE STG Manage Bowel with Medication with min Assistance.  Outcome: Not Progressing Pt requires miralax and Senna-S Total Assist.   Problem: RH BLADDER ELIMINATION Goal: RH STG MANAGE BLADDER WITH ASSISTANCE STG Manage Bladder With min ssistance  Outcome: Not Progressing Requires Total Assist I&O Cath Q8H Goal: RH STG MANAGE BLADDER WITH EQUIPMENT WITH ASSISTANCE STG Manage Bladder With Equipment With min Assistance  Outcome: Not Progressing Requires total assist I&O caths

## 2013-12-17 NOTE — Progress Notes (Signed)
[facility] ROUNDING NOTE   Subjective:   Interval History: stable no complaints Objective:  Vital signs in last 24 hours:  Temp:  [98 F (36.7 C)] 98 F (36.7 C) (02/23 0319) Pulse Rate:  [57-65] 65 (02/23 0319) Resp:  [16-18] 16 (02/23 0319) BP: (117-126)/(62-66) 117/66 mmHg (02/23 0319) SpO2:  [95 %-99 %] 95 % (02/23 0319)  Weight change:  Filed Weights   12/12/13 1830  Weight: 57 kg (125 lb 10.6 oz)    Intake/Output: I/O last 3 completed shifts: In: 480 [P.O.:480] Out: 1052 [Urine:1050; Stool:2]   Intake/Output this shift:  Total I/O In: 120 [P.O.:120] Out: -   CVS- RRR RS- CTA ABD- BS present soft non-distended EXT- no edema   Basic Metabolic Panel:  Recent Labs Lab 12/13/13 0613 12/14/13 0444 12/15/13 0529 12/16/13 0702 12/17/13 0606  NA 130* 130* 131* 127* 130*  K 4.8 4.2 4.4 4.2 5.3  CL 96 96 97 94* 97  CO2 23 22 25 25 26   GLUCOSE 105* 92 94 96 96  BUN 16 13 12 11 9   CREATININE 0.55 0.56 0.50 0.57 0.54  CALCIUM 8.6 8.3* 8.6 8.3* 8.6    Liver Function Tests:  Recent Labs Lab 12/13/13 0613 12/14/13 0444 12/15/13 0529 12/16/13 0702 12/17/13 0606  AST 123* 94* 121* 102* 105*  ALT 126* 105* 119* 111* 111*  ALKPHOS 105 100 116 111 144*  BILITOT 0.6 0.6 0.6 0.6 0.4  PROT 6.0 5.5* 5.6* 5.4* 5.7*  ALBUMIN 2.3* 2.1* 2.2* 2.2* 2.3*   No results found for this basename: LIPASE, AMYLASE,  in the last 168 hours No results found for this basename: AMMONIA,  in the last 168 hours  CBC:  Recent Labs Lab 12/11/13 0413 12/12/13 0620 12/13/13 0613  WBC 12.4* 10.0 10.9*  NEUTROABS  --   --  8.7*  HGB 9.0* 9.4* 9.5*  HCT 27.7* 27.8* 28.5*  MCV 82.4 82.2 81.0  PLT 290 277 315    Cardiac Enzymes: No results found for this basename: CKTOTAL, CKMB, CKMBINDEX, TROPONINI,  in the last 168 hours  BNP: No components found with this basename: POCBNP,   CBG:  Recent Labs Lab 12/10/13 1622  GLUCAP 122*     Microbiology: Results for orders placed during the hospital encounter of 12/07/13  URINE CULTURE     Status: None   Collection Time    12/07/13  8:55 PM      Result Value Ref Range Status   Specimen Description URINE, CATHETERIZED   Final   Special Requests NONE   Final   Culture  Setup Time     Final   Value: 12/08/2013 17:48     Performed at [facility]     Final   Value: >=100,000 COLONIES/ML     Performed at [facility]   Culture     Final   Value: KLEBSIELLA OXYTOCA     ESCHERICHIA COLI     Performed at [facility]   Report Status 12/11/2013 FINAL   Final   Organism ID, Bacteria KLEBSIELLA OXYTOCA   Final   Organism ID, Bacteria ESCHERICHIA COLI   Final  MRSA PCR SCREENING     Status: None   Collection Time    12/09/13  5:38 AM      Result Value Ref Range Status   MRSA by PCR NEGATIVE  NEGATIVE Final   Comment:  The GeneXpert MRSA Assay (FDA     approved for NASAL specimens     only), is one component of a     comprehensive MRSA colonization     surveillance program. It is not     intended to diagnose MRSA     infection nor to guide or     monitor treatment for     MRSA infections.    Coagulation Studies: No results found for this basename: LABPROT, INR,  in the last 72 hours  Urinalysis: No results found for this basename: COLORURINE, APPERANCEUR, LABSPEC, PHURINE, GLUCOSEU, HGBUR, BILIRUBINUR, KETONESUR, PROTEINUR, UROBILINOGEN, NITRITE, LEUKOCYTESUR,  in the last 72 hours    Imaging: Dg Abd 1 View  12/15/2013   CLINICAL DATA:  Abdominal pain and distention.  Constipation.  EXAM: ABDOMEN - 1 VIEW  COMPARISON:  09/13/12  FINDINGS: Generalized gaseous distention of colon is seen on today's study, suspicious for mild ileus. No evidence of dilated small bowel loops. Small bilateral pleural effusions incidentally noted.  IMPRESSION: Probable mild colonic ileus.   Electronically Signed   By: Earle Gell M.D.    On: 12/15/2013 18:41     Medications:     . furosemide  20 mg Oral Daily  . levofloxacin  250 mg Oral Daily  . levothyroxine  50 mcg Oral QAC breakfast  . polyethylene glycol  17 g Oral Daily  . Rivaroxaban  20 mg Oral Q supper  . senna-docusate  2 tablet Oral QHS  . sodium chloride  2 g Oral TID WC  . tiZANidine  2 mg Oral BID  . tiZANidine  4 mg Oral QHS   acetaminophen, acetaminophen, alum & mag hydroxide-simeth, bisacodyl, ondansetron (ZOFRAN) IV, ondansetron, sorbitol, traMADol  Assessment/ Plan:    1. SIADH  Improved with fluid restriction additional salt and lasix  Will sign off please call if needed   LOS: 5 Jhoselyn Ruffini W @TODAY @11 :40 AM

## 2013-12-17 NOTE — Progress Notes (Signed)
Subjective/Complaints: Moved bowels yesterday. Concerned about Right leg swelling. Pain under fair control. Bladder still not emptying.  A 12 point review of systems has been performed and if not noted above is otherwise negative.   Objective: Vital Signs: Blood pressure 117/66, pulse 65, temperature 98 F (36.7 C), temperature source Oral, resp. rate 16, height 5' 1"  (1.549 m), weight 57 kg (125 lb 10.6 oz), SpO2 95.00%. Dg Abd 1 View  12/15/2013   CLINICAL DATA:  Abdominal pain and distention.  Constipation.  EXAM: ABDOMEN - 1 VIEW  COMPARISON:  09/13/12  FINDINGS: Generalized gaseous distention of colon is seen on today's study, suspicious for mild ileus. No evidence of dilated small bowel loops. Small bilateral pleural effusions incidentally noted.  IMPRESSION: Probable mild colonic ileus.   Electronically Signed   By: Earle Gell M.D.   On: 12/15/2013 18:41   No results found for this basename: WBC, HGB, HCT, PLT,  in the last 72 hours  Recent Labs  12/16/13 0702 12/17/13 0606  NA 127* 130*  K 4.2 5.3  CL 94* 97  GLUCOSE 96 96  BUN 11 9  CREATININE 0.57 0.54  CALCIUM 8.3* 8.6   CBG (last 3)  No results found for this basename: GLUCAP,  in the last 72 hours  Wt Readings from Last 3 Encounters:  12/12/13 57 kg (125 lb 10.6 oz)  12/07/13 44.453 kg (98 lb)  12/07/13 44.453 kg (98 lb)    Physical Exam:  Constitutional: She is oriented to person, place, and time. No distress.  HENT:  Head: Normocephalic.  Eyes: EOM are normal.  Neck: Normal range of motion. Neck supple. No thyromegaly present.   Cardiovascular:  Cardiac rate controlled. No murmur. Chest incision clean/healed  Respiratory: Effort normal and breath sounds normal. No respiratory distress. No rales or wheezes  GI: Soft. Bowel sounds are normal. She exhibits no real distension.  Neurological: She is alert and oriented to person, place, and time.  Musc: right leg quite tender. Edema 2+ RLE Follows  commands. Somewhat distracted.UE's 4+ to 5/5 at deltoid, bicep, tricep, HI. LLE 2HF, 3- KE and 4/5 with ADF/APF. RLE trace hip (pain), 1 KE, 3+ ADF/APF. Sensation appears grossly intact. DTR's 1+  Skin:  Hip incision dry, intact,  , 4cm blood blister on right heel which is tender to touch.   Assessment/Plan: 1. Functional deficits secondary to right femoral neck fracture which require 3+ hours per day of interdisciplinary therapy in a comprehensive inpatient rehab setting. Physiatrist is providing close team supervision and 24 hour management of active medical problems listed below. Physiatrist and rehab team continue to assess barriers to discharge/monitor patient progress toward functional and medical goals. FIM: FIM - Bathing Bathing Steps Patient Completed: Chest;Right Arm;Left Arm;Abdomen;Front perineal area Bathing: 3: Mod-Patient completes 5-7 64f10 parts or 50-74%  FIM - Upper Body Dressing/Undressing Upper body dressing/undressing steps patient completed: Thread/unthread right bra strap;Thread/unthread left bra strap;Thread/unthread right sleeve of pullover shirt/dresss;Thread/unthread left sleeve of pullover shirt/dress;Put head through opening of pull over shirt/dress;Pull shirt over trunk Upper body dressing/undressing: 4: Min-Patient completed 75 plus % of tasks FIM - Lower Body Dressing/Undressing Lower body dressing/undressing: 1: Total-Patient completed less than 25% of tasks  FIM - Toileting Toileting: 1: Total-Patient completed zero steps, helper did all 3  FIM - TRadio producerDevices: Grab bars Toilet Transfers: 3-To toilet/BSC: Mod A (lift or lower assist);3-From toilet/BSC: Mod A (lift or lower assist)  FIM - Control and instrumentation engineer Devices: Arm rests;Bed rails Bed/Chair Transfer: 2: Supine > Sit: Max A (lifting assist/Pt. 25-49%);2: Sit > Supine: Max A (lifting assist/Pt. 25-49%);2: Bed > Chair or W/C: Max A  (lift and lower assist);2: Chair or W/C > Bed: Max A (lift and lower assist)  FIM - Locomotion: Wheelchair Locomotion: Wheelchair: 2: Travels 50 - 149 ft with minimal assistance (Pt.>75%) FIM - Locomotion: Ambulation Locomotion: Ambulation Assistive Devices: Administrator Ambulation/Gait Assistance: 3: Mod assist Locomotion: Ambulation: 2: Travels 50 - 149 ft with moderate assistance (Pt: 50 - 74%)  Comprehension Comprehension Mode: Auditory Comprehension: 5-Understands complex 90% of the time/Cues < 10% of the time  Expression Expression Mode: Verbal Expression: 5-Expresses complex 90% of the time/cues < 10% of the time  Social Interaction Social Interaction: 5-Interacts appropriately 90% of the time - Needs monitoring or encouragement for participation or interaction.  Problem Solving Problem Solving: 4-Solves basic 75 - 89% of the time/requires cueing 10 - 24% of the time  Memory Memory: 4-Recognizes or recalls 75 - 89% of the time/requires cueing 10 - 24% of the time  Medical Problem List and Plan:  1. Right femoral neck fracture secondary to fall. Status post hemiarthroplasty 12/09/2013 weightbearing as tolerated with posterior hip cautions  2. DVT/ Anticoagulation: Xarelto. Has history of RLE DVT--no need to recheck doppler  -elevate right leg and compress  3. Pain Management: Zanaflex 2 mg 3 times a day,--will increase HS dose to 78m for HS spasms.  - Ultram 50 mg every 6 hours as  needed pain. May have to back off ultram if she continues to have hallucinations or AMS 4. Neuropsych: This patient is capable of making decisions on her own behalf.  5. Acute blood loss anemia. Latest hemoglobin 9.4. Followup CBC  6. SIADH. Sodium level to 130 today.  1200 cc fluid restriction continues   - Continue low-dose Lasix  per nephrology   -potassium elevated 5.3 --will defer to renal. Consider kayexalat 7. Diastolic congestive heart failure. No signs of fluid overload. Continue  low-dose diuretic  8. CAD/CABG. Followup cardiology services as needed. No chest pain or shortness of breath  9. Atrial fibrillation. Cardiac rate control. Continue xarelto  10. Hypothyroidism. Synthroid. Latest TSH level 2.175  11. Klebsiella/Escherichia coli UTI with urinary retention.  -continue levaquin  -I wish to continue voiding trial with cathing prn but may need to consider foley given lack of progress  -needs to be up to void and need to continue timed voiding attempts  -moved bowels on 2/19   - not a candidate for urecholine or flomax given CV history, BP  12. Skin breakdown right heel--   -elevate   -prevalon boot 13. Constipation:  -+results with sorbitol  -miralax, stry sennokot s at night   LOS (Days) 5 A FACE TO FACE EVALUATION WAS PERFORMED  SWARTZ,ZACHARY T 12/17/2013 7:38 AM

## 2013-12-17 NOTE — Progress Notes (Signed)
Occupational Therapy Session Notes  Patient Details  Name: Maria [name] MRN: [id number] Date of Birth: [birth year]  Today's Date: 12/17/2013  Short Term Goals: Week 1:  OT Short Term Goal 1 (Week 1): Patient will complete toilet transfer with Min A OT Short Term Goal 2 (Week 1): Patient will perform upper body bathing and dressing, seated, with supervision and setup OT Short Term Goal 3 (Week 1): Patient will perform lower body bathing with steadying assist while standing OT Short Term Goal 4 (Week 1): Patient will complete lower body dressing using AE, prn, with mod assist OT Short Term Goal 5 (Week 1): Patient will complete toileting with mod assist  Skilled Therapeutic Interventions/Progress Updates:   Session #1 [id number] - 55 Minutes Individual Therapy No complaints of pain Upon entering room, patient found seated in w/c with quick release belt donned. Patient with complaints of fatigue secondary to previous PT session. Therapist encouraged patient to participate in OT and patient willing. From here, patient completed UB/LB bathing & dressing and grooming tasks in sit<>stand position from sink. During dressing, therapist discussed and educated patient on compensatory techniques to help increase patient's independence with LB tasks; using reacher and threading right LE before left LE. Patient seemed receptive to these teachings. Patient with increased gas this session and complaints of nausea towards end of session. Patient transferred back to bed and therapist assisted with re-positioning in bed; elevating RLE to help decrease edema. Patient left supine in bed with all needed items within reach.   Session #2 [id number] - 40 Minutes Individual Therapy No complaints of pain Upon entering room, patient found supine in bed. Patient engaged in bed mobility and sat EOB with min assist. From here, patient donned pants. Therapist educated patient on using reacher to increase independence;  patient able to don pants with min assist. From here, patient ambulated from EOB>w/c using RW with steady assist. Patient then propelled self from room > therapy gym with supervision. Patient transferred onto edge of mat with min assist, using RW. Therapist then engaged patient in RLE strengthening exercises in order to help decrease edema throughout leg and increase strength for self-care tasks. Therapist left patient seated edge of mat waiting on PT for next therapy session.   Precautions:  Precautions Precautions: Posterior Hip;Fall Precaution Booklet Issued: Yes (comment) Precaution Comments: Pt recalls 2/3 precautions Required Braces or Orthoses: Other Brace/Splint Other Brace/Splint: Prevalon Boot for Rt. Heel  Restrictions Weight Bearing Restrictions: Yes RLE Weight Bearing: Weight bearing as tolerated  See FIM for current functional status  [name] 12/17/2013, 7:25 AM

## 2013-12-17 NOTE — Progress Notes (Signed)
Physical Therapy Session Note  Patient Details  Name: Maria [name] MRN: [id number] Date of Birth: 10-Sep-1924  Today's Date: 12/17/2013 First Session Time: 0730-0826 Time Calculation (min): 56 min  Short Term Goals: Week 1:  PT Short Term Goal 1 (Week 1): Pt will perform sit <> stands with min assist from bed or w/c.  PT Short Term Goal 2 (Week 1): Pt will transfer bed <> w/c with moderate assistance and LRAD PT Short Term Goal 3 (Week 1): Pt will ambulate x 30' with RW and min assist.   Skilled Therapeutic Interventions/Progress Updates:    Pain: 2-3/10 Rt hip - premedicated  Finishing breakfast, PT donned bil TEDs pt very anxious about Rt heel. Pt very slow moving with all tasks. Able to recall 3/3 hip precautions with use of visual memory aid, no verbal cues. Supine>sit with min assist and elevated HOB and increased time and encouragement for independence. Ambulation x 37' with RW and cues for appropriate proximity of RW, sequencing of RW. W/c propulsion x 120' with supervision for strengthening and conditioning, min verbal cues and increased time.  Extended rest breaks needed for all tasks.  Second Session Time:  1355-1420 Time Calculation (min): 25 min Skilled Therapeutic Interventions/Progress Updates:  Standing balance with only one UE support, tossing horseshoes x 3 bouts min assist (posterior lean) progressing to min-guard assist. Sit <> stand mod assist progressing to min assist with cues for UE/LE placement. Ambulation x 80' with RW and min assist, one major loss of balance as pt turning body quickly to look at another therapist. "Whoa I'm not steady!"   Therapy Documentation Precautions:  Precautions Precautions: Posterior Hip;Fall Precaution Booklet Issued: Yes (comment) Precaution Comments: Pt recalls 2/3 precautions Required Braces or Orthoses: Other Brace/Splint Other Brace/Splint: Prevalon Boot for Rt. Heel  Restrictions Weight Bearing Restrictions: Yes RLE  Weight Bearing: Weight bearing as tolerated  See FIM for current functional status  Therapy/Group: Individual Therapy both sessions  Lahoma Rocker 12/17/2013, 8:28 AM

## 2013-12-18 ENCOUNTER — Inpatient Hospital Stay (HOSPITAL_COMMUNITY): Payer: Medicare Other

## 2013-12-18 LAB — COMPREHENSIVE METABOLIC PANEL
ALBUMIN: 2.2 g/dL — AB (ref 3.5–5.2)
ALT: 89 U/L — ABNORMAL HIGH (ref 0–35)
AST: 74 U/L — AB (ref 0–37)
Alkaline Phosphatase: 129 U/L — ABNORMAL HIGH (ref 39–117)
BUN: 9 mg/dL (ref 6–23)
CALCIUM: 8.4 mg/dL (ref 8.4–10.5)
CO2: 26 mEq/L (ref 19–32)
CREATININE: 0.63 mg/dL (ref 0.50–1.10)
Chloride: 96 mEq/L (ref 96–112)
GFR calc Af Amer: >90 mL/min (ref >90)
GFR calc non Af Amer: 78 mL/min — ABNORMAL LOW (ref >90)
Glucose, Bld: 97 mg/dL (ref 70–99)
Potassium: 4.8 mEq/L (ref 3.7–5.3)
Sodium: 130 mEq/L — ABNORMAL LOW (ref 137–147)
Total Bilirubin: 0.4 mg/dL (ref 0.3–1.2)
Total Protein: 5.6 g/dL — ABNORMAL LOW (ref 6.0–8.3)

## 2013-12-18 MED ORDER — BISACODYL 10 MG RE SUPP
10.0000 mg | RECTAL | Status: DC
  Administered 2013-12-18 – 2013-12-24 (×3): 10 mg via RECTAL
  Filled 2013-12-18 (×4): qty 1

## 2013-12-18 MED ORDER — TAMSULOSIN HCL 0.4 MG PO CAPS
0.4000 mg | ORAL_CAPSULE | Freq: Every day | ORAL | Status: DC
  Administered 2013-12-18 – 2013-12-27 (×10): 0.4 mg via ORAL
  Filled 2013-12-18 (×11): qty 1

## 2013-12-18 NOTE — Progress Notes (Signed)
Patient reports she is having episodes of hallucinations and "bad" dreams . Patient reports she has been  experiencing these episodes during naps in daytime. Patient provided emotional support and remains alert and oriented x 4 at this time . Continue with plan of care .                              Maria [name]

## 2013-12-18 NOTE — Progress Notes (Signed)
Physical Therapy Session Note  Patient Details  Name: Maria [name] MRN: [id number] Date of Birth: 11-16-1923  Today's Date: 12/18/2013 Time: 2595-6387 Time Calculation (min): 58 min  Short Term Goals: Week 1:  PT Short Term Goal 1 (Week 1): Pt will perform sit <> stands with min assist from bed or w/c.  PT Short Term Goal 2 (Week 1): Pt will transfer bed <> w/c with moderate assistance and LRAD PT Short Term Goal 3 (Week 1): Pt will ambulate x 30' with RW and min assist.   Skilled Therapeutic Interventions/Progress Updates:    Session consisted of pt performing w/c propulsion from her room to the rehab gym with Min A (pt showed slow propulsion speed and needed verbal cueing in order to appropriately turn with the w/c) in order to improve functional independence, performing LLE ankle pumps and BLE LAQ's in order to improve LE strength bilaterally, performing 2 sit to stands with Mod A and the use of a RW (patient needed verbal cueing for appropriate hand placement when performing transfer), and pt performing static stance 2 x 1 minute with Min A and the use of a RW in order to improve static standing balance. Pt c/o dizziness when performing sit to stand transfers  therefore BP was checked with the results being as follows: sitting BP- 115/54 and standing BP- 88/50. Pt did not perform gait during this session due to her low standing BP and the RN was notified of this at the end of the session.   Therapy Documentation Precautions:  Precautions Precautions: Posterior Hip;Fall Precaution Booklet Issued: Yes (comment) Precaution Comments: Pt recalls 2/3 precautions Required Braces or Orthoses: Other Brace/Splint Other Brace/Splint: Prevalon Boot for Rt. Heel  Restrictions Weight Bearing Restrictions: Yes RLE Weight Bearing: Weight bearing as tolerated Pain:  Pt c/o 4/10 pain in right hip and right heel. Pt took rest breaks throughout in order to alleviate this pain.      See FIM for  current functional status  Therapy/Group: Individual Therapy  Shonte Soderlund 12/18/2013, 8:57 AM

## 2013-12-18 NOTE — Progress Notes (Signed)
Dr. Letta Pate on unit.  Notified of patient being in and out cathed at [facility] for 400; voided a small amt around 0530; bladder scan now reading 551 ml and patient c/o "feeling pressure".  Order received:  In and out cath for bladder scan > 350 ml.

## 2013-12-18 NOTE — Progress Notes (Signed)
Physical Therapy Session Note  Patient Details  Name: TOPANGA ALVELO MRN: [id number] Date of Birth: August 30, 1924  Today's Date: 12/18/2013 Time: 6815-9470 Time Calculation (min): 36 min  Short Term Goals: Week 1:  PT Short Term Goal 1 (Week 1): Pt will perform sit <> stands with min assist from bed or w/c.  PT Short Term Goal 2 (Week 1): Pt will transfer bed <> w/c with moderate assistance and LRAD PT Short Term Goal 3 (Week 1): Pt will ambulate x 30' with RW and min assist.   Skilled Therapeutic Interventions/Progress Updates:    Session began with pt performing a sit to stand transfer with Mod A and the use of a RW (verbal cueing needed for proper hand and foot placement when performing transfer), static stance x 1 min with Min A and the use of a RW (pt initially presented with a posterior lean but this was corrected with verbal cueing), and gait x 100' with Min A while also having to perform a turn and having to avoid hitting a couple of objects (pt needed verbal cueing for proper sequencing of RW, for an increase in R hip flexion in swing phase to help prevent circumduction, and in order to appropriately position feet to turn) to help improve gait mechanics and functional independence. Session concluded with pt performing stand pivot transfers with Mod A and the use of a RW (verbal cueing in order to turn while following hip precautions) and bed mobility with Mod A (needed help raising both legs onto the bed). BP was taken this afternoon due to pt's low blood pressure upon standing this morning with results being as follows: sitting- 122/63 and standing- 108/59 with pt also having no subjective c/o dizziness during the afternoon session. RN was notified of these results at the end of the session.   Therapy Documentation Precautions:  Precautions Precautions: Posterior Hip;Fall Precaution Booklet Issued: Yes (comment) Precaution Comments: Pt recalls 2/3 precautions Required Braces or  Orthoses: Other Brace/Splint Other Brace/Splint: Prevalon Boot for Rt. Heel  Restrictions Weight Bearing Restrictions: Yes RLE Weight Bearing: Weight bearing as tolerated Pain:  Pt had c/o of right hip pain throughout the session. Pt took multiple rest breaks and was repositioned in the bed at the end of the session in order to alleviate this pain.     See FIM for current functional status  Therapy/Group: Individual Therapy  Cristin Szatkowski 12/18/2013, 3:31 PM

## 2013-12-18 NOTE — Patient Care Conference (Signed)
Inpatient RehabilitationTeam Conference and Plan of Care Update Date: 12/18/2013   Time: 2:10 PM    Patient Name: Maria [name]      Medical Record Number: [id number]  Date of Birth: 1924/10/05 Sex: Female         Room/Bed: 4W02C/4W02C-01 Payor Info: Payor: MEDICARE / Plan: MEDICARE PART A AND B / Product Type: *No Product type* /    Admitting Diagnosis: FN FX  Admit Date/Time:  12/12/2013  6:05 PM Admission Comments: No comment available   Primary Diagnosis:  <principal problem not specified> Principal Problem: <principal problem not specified>  Patient Active Problem List   Diagnosis Date Noted  . Femoral neck fracture 12/12/2013  . Atonic bladder 12/11/2013  . Acute confusional state 12/11/2013  . Hyponatremia 12/08/2013  . Fall 12/08/2013  . Closed right hip fracture 12/07/2013  . UTI (lower urinary tract infection) 12/07/2013  . Hip fracture, right 12/07/2013  . Bradycardia 11/07/2013  . Anticoagulation goal of INR 2.5 to 3.5 11/07/2013  . Protein-calorie malnutrition, severe 10/04/2013  . DVT (deep venous thrombosis) 10/03/2013  . Pleural effusion 09/15/2013  . Hx of CABG 09/12/2013  . Malnutrition of mild degree 09/03/2013  . Respiratory failure, post-operative 08/29/2013  . Coronary atherosclerosis of native coronary artery 08/16/2013    Class: Acute  . Mitral regurgitation 08/07/2013  . Pulmonary hypertension, moderate to severe 08/07/2013  . Chronic diastolic heart failure 08/07/2013    Class: Chronic  . Essential hypertension, benign 08/02/2013  . Nontoxic uninodular goiter 08/02/2013  . Unspecified hereditary and idiopathic peripheral neuropathy 08/02/2013  . Occlusion and stenosis of carotid artery without mention of cerebral infarction 08/02/2013  . Unspecified vitamin D deficiency 08/02/2013  . Depressive disorder, not elsewhere classified 08/02/2013  . Paroxysmal atrial fibrillation 08/02/2013  . Trifascicular block 08/02/2013  . Gastric ulcer,  unspecified as acute or chronic, without mention of hemorrhage, perforation, or obstruction 08/02/2013  . Sinoatrial node dysfunction 08/02/2013  . Other dyspnea and respiratory abnormality 08/02/2013  . Encounter for long-term (current) use of other medications 08/02/2013  . NONSPECIFIC ABNORMAL FIND RAD&OTH EXAM GI TRACT 05/30/2008    Expected Discharge Date: Expected Discharge Date: 01/03/14  Team Members Present: Social Worker Present: Staci Acosta, LCSW Nurse Present: Carmie End, RN PT Present: Karolee Stamps, PT OT Present: Edwin Cap, Loistine Chance, OT;Frank Coplay, OT SLP Present: Feliberto Gottron, SLP PPS Coordinator present : Tora Duck, RN, CRRN;Becky Henrene Dodge, PT     Current Status/Progress Goal Weekly Team Focus  Medical   right hp fx, urine retention/UTI, pain control, CAD  improve functional mobility  skin care, bladder   Bowel/Bladder   requires in and out caths q 8 hrs due to inability to void.  LBM 2/22.  Had small smear on 2/23  Voiding at least once q 8 hrs  Monitor for voids   Swallow/Nutrition/ Hydration             ADL's   Min A upper body B & D, Max A lower body B & D, Mod A transfer  Supervision transfers and seated upper body ADL, Min A lower body dressing and standing balance  Sitting/standing balance during ADL, transfers, hip precautions, improved activity tolerance   Mobility   Min/mod assist sit <> stands, min assist gait. Needs lots of motivation  supervision/min assist  Activity tolerance, standing balance and balance reactions, maintaining precautions, generalized strengthening, safety with mobility    Communication             Safety/Cognition/  Behavioral Observations  bed alarm in use.  no unsafe behavior noted.  to keep patient safe from falls  monitor for unsafe behaviors.  continue use of bed alarm   Pain   receiving Ultram 50 mg q 6 hrs prn with relief  to keep <2  monitor for pain q 6 hrs and prn; medicate appropriately   Skin    surgical incision to rt hip with sutures intact with foam dsg intact; large intact blister to rt heel with foam dsg intact  to prevent any futher skin breakdown or infection  monitor skin q shift and prn for s/s infection or breakdown    Rehab Goals Patient on target to meet rehab goals: Yes Rehab Goals Revised: None *See Care Plan and progress notes for long and short-term goals.  Barriers to Discharge: urine retention, pain, heel wound    Possible Resolutions to Barriers:  off loading, pain control, education    Discharge Planning/Teaching Needs:  Pt plans to return to her one story independent living home at [facility].  She will have 24/7 assistance from her husband and intermittent from her children.    Family is very willing and able to complete any family education.   Team Discussion:  Pt with pain still to right hip and has a right heel blood blister that also causes her pain when she is weight bearing.  Sodium issues are balancing out.  Urology remains involved and pt needs to get up to the commode regularly to try to void.  OT would like to try to relieve some of the pressure from her heel when she is on her feet.    Revisions to Treatment Plan:  None   Continued Need for Acute Rehabilitation Level of Care: The patient requires daily medical management by a physician with specialized training in physical medicine and rehabilitation for the following conditions: Daily direction of a multidisciplinary physical rehabilitation program to ensure safe treatment while eliciting the highest outcome that is of practical value to the patient.: Yes Daily medical management of patient stability for increased activity during participation in an intensive rehabilitation regime.: Yes Daily analysis of laboratory values and/or radiology reports with any subsequent need for medication adjustment of medical intervention for : Neurological problems;Post surgical problems;Cardiac  problems  Lamarion Mcevers, Vista Deck 12/18/2013, 2:57 PM

## 2013-12-18 NOTE — Progress Notes (Signed)
Reviewed and in agreement with treatment provided.  

## 2013-12-18 NOTE — Progress Notes (Signed)
Social Work Patient ID: Maria [name], female   DOB: 04/18/1924, 80 y.o.   MRN: [id number]  Vista Deck Maria Rucci, LCSW Social Worker Signed  Patient Care Conference Service date: 12/18/2013 2:57 PM  Inpatient RehabilitationTeam Conference and Plan of Care Update Date: 12/18/2013   Time: 2:10 PM     Patient Name: Maria [name]       Medical Record Number: [id number]   Date of Birth: 03-29-1924 Sex: Female         Room/Bed: 4W02C/4W02C-01 Payor Info: Payor: MEDICARE / Plan: MEDICARE PART A AND B / Product Type: *No Product type* /   Admitting Diagnosis: FN FX   Admit Date/Time:  12/12/2013  6:05 PM Admission Comments: No comment available   Primary Diagnosis:  <principal problem not specified> Principal Problem: <principal problem not specified>    Patient Active Problem List     Diagnosis  Date Noted   .  Femoral neck fracture  12/12/2013   .  Atonic bladder  12/11/2013   .  Acute confusional state  12/11/2013   .  Hyponatremia  12/08/2013   .  Fall  12/08/2013   .  Closed right hip fracture  12/07/2013   .  UTI (lower urinary tract infection)  12/07/2013   .  Hip fracture, right  12/07/2013   .  Bradycardia  11/07/2013   .  Anticoagulation goal of INR 2.5 to 3.5  11/07/2013   .  Protein-calorie malnutrition, severe  10/04/2013   .  DVT (deep venous thrombosis)  10/03/2013   .  Pleural effusion  09/15/2013   .  Hx of CABG  09/12/2013   .  Malnutrition of mild degree  09/03/2013   .  Respiratory failure, post-operative  08/29/2013   .  Coronary atherosclerosis of native coronary artery  08/16/2013       Class: Acute   .  Mitral regurgitation  08/07/2013   .  Pulmonary hypertension, moderate to severe  08/07/2013   .  Chronic diastolic heart failure  08/07/2013       Class: Chronic   .  Essential hypertension, benign  08/02/2013   .  Nontoxic uninodular goiter  08/02/2013   .  Unspecified hereditary and idiopathic peripheral neuropathy  08/02/2013   .  Occlusion and  stenosis of carotid artery without mention of cerebral infarction  08/02/2013   .  Unspecified vitamin D deficiency  08/02/2013   .  Depressive disorder, not elsewhere classified  08/02/2013   .  Paroxysmal atrial fibrillation  08/02/2013   .  Trifascicular block  08/02/2013   .  Gastric ulcer, unspecified as acute or chronic, without mention of hemorrhage, perforation, or obstruction  08/02/2013   .  Sinoatrial node dysfunction  08/02/2013   .  Other dyspnea and respiratory abnormality  08/02/2013   .  Encounter for long-term (current) use of other medications  08/02/2013   .  NONSPECIFIC ABNORMAL FIND RAD&OTH EXAM GI TRACT  05/30/2008     Expected Discharge Date: Expected Discharge Date: 01/03/14  Team Members Present: Social Worker Present: Staci Acosta, LCSW Nurse Present: Carmie End, RN PT Present: Karolee Stamps, PT OT Present: Edwin Cap, Loistine Chance, OT;Frank Hallock, OT SLP Present: Feliberto Gottron, SLP PPS Coordinator present : Tora Duck, RN, CRRN;Becky Henrene Dodge, PT        Current Status/Progress  Goal  Weekly Team Focus   Medical     right hp fx, urine retention/UTI, pain control, CAD  improve functional mobility  skin care, bladder   Bowel/Bladder     requires in and out caths q 8 hrs due to inability to void.  LBM 2/22.  Had small smear on 2/23  Voiding at least once q 8 hrs  Monitor for voids   Swallow/Nutrition/ Hydration            ADL's     Min A upper body B & D, Max A lower body B & D, Mod A transfer  Supervision transfers and seated upper body ADL, Min A lower body dressing and standing balance  Sitting/standing balance during ADL, transfers, hip precautions, improved activity tolerance   Mobility     Min/mod assist sit <> stands, min assist gait. Needs lots of motivation  supervision/min assist  Activity tolerance, standing balance and balance reactions, maintaining precautions, generalized strengthening, safety with mobility    Communication             Safety/Cognition/ Behavioral Observations    bed alarm in use.  no unsafe behavior noted.  to keep patient safe from falls  monitor for unsafe behaviors.  continue use of bed alarm   Pain     receiving Ultram 50 mg q 6 hrs prn with relief  to keep <2  monitor for pain q 6 hrs and prn; medicate appropriately   Skin     surgical incision to rt hip with sutures intact with foam dsg intact; large intact blister to rt heel with foam dsg intact  to prevent any futher skin breakdown or infection  monitor skin q shift and prn for s/s infection or breakdown    Rehab Goals Patient on target to meet rehab goals: Yes Rehab Goals Revised: None *See Care Plan and progress notes for long and short-term goals.    Barriers to Discharge:  urine retention, pain, heel wound      Possible Resolutions to Barriers:    off loading, pain control, education      Discharge Planning/Teaching Needs:    Pt plans to return to her one story independent living home at [facility].  She will have 24/7 assistance from her husband and intermittent from her children.    Family is very willing and able to complete any family education.    Team Discussion:    Pt with pain still to right hip and has a right heel blood blister that also causes her pain when she is weight bearing.  Sodium issues are balancing out.  Urology remains involved and pt needs to get up to the commode regularly to try to void.  OT would like to try to relieve some of the pressure from her heel when she is on her feet.     Revisions to Treatment Plan:    None    Continued Need for Acute Rehabilitation Level of Care: The patient requires daily medical management by a physician with specialized training in physical medicine and rehabilitation for the following conditions: Daily direction of a multidisciplinary physical rehabilitation program to ensure safe treatment while eliciting the highest outcome that is of practical value to the patient.:  Yes Daily medical management of patient stability for increased activity during participation in an intensive rehabilitation regime.: Yes Daily analysis of laboratory values and/or radiology reports with any subsequent need for medication adjustment of medical intervention for : Neurological problems;Post surgical problems;Cardiac problems  Danyela Posas, Vista Deck 12/18/2013, 2:57 PM

## 2013-12-18 NOTE — Progress Notes (Signed)
Occupational Therapy Session Note  Patient Details  Name: Maria [name] MRN: [id number] Date of Birth: 1924-02-25  Today's Date: 12/18/2013 Time: 2563-8937 Time Calculation (min): 65 min  Short Term Goals: Week 1:  OT Short Term Goal 1 (Week 1): Patient will complete toilet transfer with Min A OT Short Term Goal 2 (Week 1): Patient will perform upper body bathing and dressing, seated, with supervision and setup OT Short Term Goal 3 (Week 1): Patient will perform lower body bathing with steadying assist while standing OT Short Term Goal 4 (Week 1): Patient will complete lower body dressing using AE, prn, with mod assist OT Short Term Goal 5 (Week 1): Patient will complete toileting with mod assist  Skilled Therapeutic Interventions/Progress Updates: ADL-retraining with focus on improved transfers, endurance, adherence to posterior hip precautions, sit <> stand, and adapted dressing skills.   Patient completed bathing at w/c level with extra time.  Pt required max assist for lower body dressing  due to not having her AE available as recommended.  Patient is careful to observe hip precautions during her ADL and now demo's improved performance with her transfers and with static standing balance while assisted with pulling up her pants.       Therapy Documentation Precautions:  Precautions Precautions: Posterior Hip;Fall Precaution Booklet Issued: Yes (comment) Precaution Comments: Pt recalls 2/3 precautions Required Braces or Orthoses: Other Brace/Splint Other Brace/Splint: Prevalon Boot for Rt. Heel  Restrictions Weight Bearing Restrictions: Yes RLE Weight Bearing: Weight bearing as tolerated  Pain: No report of pain   ADL: ADL ADL Comments: see FIM  See FIM for current functional status  Therapy/Group: Individual Therapy Second session: Time: 3428-7681 Time Calculation (min):  45 min  Pain Assessment: No report of pain  Skilled Therapeutic Interventions: ADL-retraining  (30 min) with focus on lower body dressing, performance of sit <> stand, weight-shifting, and general endurance.   Therapeutic activity (15 min) with focus on dynamic standing balance, standing tolerance, and improved sustained attention.    Patient was received in her room, sitting at edge of bed wearing only a protective brief (diaper), attended by RN after having completed toileting.   Patient was receptive to finishing her lower body dressing at sink side with OT assist, to include performing sit <> stand to don pants and socks.    Patient required extra time to dress and mod assist to manage R-LE.   Following dressing, patient was educated on benefit of therapeutic activity promoting standing tolerance by playing game of [place].   Patient performed tasks as directed with improved attention and pace, requiring no more than mod assist to initiate standing (pt = 70%), and maintaining standing balance for 5 minutes during game play.   See FIM for current functional status  Therapy/Group: Individual Therapy  [name] 12/18/2013, 10:00 AM

## 2013-12-18 NOTE — Progress Notes (Signed)
Subjective/Complaints: Emptied bladder a little yesterday, but generally still required cath.  A 12 point review of systems has been performed and if not noted above is otherwise negative.   Objective: Vital Signs: Blood pressure 140/54, pulse 60, temperature 97.9 F (36.6 C), temperature source Oral, resp. rate 18, height 5' 1"  (1.549 m), weight 57 kg (125 lb 10.6 oz), SpO2 98.00%. No results found. No results found for this basename: WBC, HGB, HCT, PLT,  in the last 72 hours  Recent Labs  12/17/13 0606 12/18/13 0458  NA 130* 130*  K 5.3 4.8  CL 97 96  GLUCOSE 96 97  BUN 9 9  CREATININE 0.54 0.63  CALCIUM 8.6 8.4   CBG (last 3)  No results found for this basename: GLUCAP,  in the last 72 hours  Wt Readings from Last 3 Encounters:  12/12/13 57 kg (125 lb 10.6 oz)  12/07/13 44.453 kg (98 lb)  12/07/13 44.453 kg (98 lb)    Physical Exam:  Constitutional: She is oriented to person, place, and time. No distress.  HENT:  Head: Normocephalic.  Eyes: EOM are normal.  Neck: Normal range of motion. Neck supple. No thyromegaly present.   Cardiovascular:  Cardiac rate controlled. No murmur. Chest incision clean/healed  Respiratory: Effort normal and breath sounds normal. No respiratory distress. No rales or wheezes  GI: Soft. Bowel sounds are normal. She exhibits no real distension.  Neurological: She is alert and oriented to person, place, and time.  Musc: right leg quite tender. Edema 2+ RLE Follows commands. Somewhat distracted.UE's 4+ to 5/5 at deltoid, bicep, tricep, HI. LLE 2HF, 3- KE and 4/5 with ADF/APF. RLE trace hip (pain), 1 KE, 3+ ADF/APF. Sensation appears grossly intact. DTR's 1+  Skin:  Hip incision dry, intact,  , 4cm blood blister on right heel which is tender to touch.   Assessment/Plan: 1. Functional deficits secondary to right femoral neck fracture which require 3+ hours per day of interdisciplinary therapy in a comprehensive inpatient rehab  setting. Physiatrist is providing close team supervision and 24 hour management of active medical problems listed below. Physiatrist and rehab team continue to assess barriers to discharge/monitor patient progress toward functional and medical goals. FIM: FIM - Bathing Bathing Steps Patient Completed: Chest;Right Arm;Left Arm;Abdomen;Right upper leg;Left upper leg Bathing: 3: Mod-Patient completes 5-7 49f10 parts or 50-74%  FIM - Upper Body Dressing/Undressing Upper body dressing/undressing steps patient completed: Thread/unthread right sleeve of pullover shirt/dresss;Thread/unthread left sleeve of pullover shirt/dress;Put head through opening of pull over shirt/dress;Pull shirt over trunk;Thread/unthread right sleeve of front closure shirt/dress;Thread/unthread left sleeve of front closure shirt/dress;Pull shirt around back of front closure shirt/dress;Button/unbutton shirt Upper body dressing/undressing: 5: Set-up assist to: Obtain clothing/put away FIM - Lower Body Dressing/Undressing Lower body dressing/undressing: 1: Total-Patient completed less than 25% of tasks  FIM - Toileting Toileting: 1: Total-Patient completed zero steps, helper did all 3  FIM - TRadio producerDevices: Grab bars Toilet Transfers: 0-Activity did not occur  FIM - BControl and instrumentation engineerDevices: Bed rails Bed/Chair Transfer: 3: Supine > Sit: Mod A (lifting assist/Pt. 50-74%/lift 2 legs;3: Sit > Supine: Mod A (lifting assist/Pt. 50-74%/lift 2 legs);2: Bed > Chair or W/C: Max A (lift and lower assist);3: Chair or W/C > Bed: Mod A (lift or lower assist)  FIM - Locomotion: Wheelchair Locomotion: Wheelchair: 2: Travels 50 - 149 ft with supervision, cueing or coaxing FIM - Locomotion: Ambulation Locomotion: Ambulation  Assistive Devices: Administrator Ambulation/Gait Assistance: 4: Min assist Locomotion: Ambulation: 2: Travels 50 - 149 ft with minimal assistance  (Pt.>75%)  Comprehension Comprehension Mode: Auditory Comprehension: 5-Understands complex 90% of the time/Cues < 10% of the time  Expression Expression Mode: Verbal Expression: 5-Expresses complex 90% of the time/cues < 10% of the time  Social Interaction Social Interaction: 5-Interacts appropriately 90% of the time - Needs monitoring or encouragement for participation or interaction.  Problem Solving Problem Solving: 4-Solves basic 75 - 89% of the time/requires cueing 10 - 24% of the time  Memory Memory: 4-Recognizes or recalls 75 - 89% of the time/requires cueing 10 - 24% of the time  Medical Problem List and Plan:  1. Right femoral neck fracture secondary to fall. Status post hemiarthroplasty 12/09/2013 weightbearing as tolerated with posterior hip cautions  2. DVT/ Anticoagulation: Xarelto. Has history of RLE DVT--no need to recheck doppler  -elevate right leg and compress  3. Pain Management: Zanaflex 2 mg 3 times a day,--will increase HS dose to 22m for HS spasms.  - Ultram 50 mg every 6 hours as  needed pain. May have to back off ultram if she continues to have hallucinations or AMS 4. Neuropsych: This patient is capable of making decisions on her own behalf.  5. Acute blood loss anemia. Latest hemoglobin 9.4. Followup CBC  6. SIADH. Sodium level to 130 today.  1200 cc fluid restriction continues   - Continue low-dose Lasix  per nephrology   -potassium follow up 7. Diastolic congestive heart failure. No signs of fluid overload. Continue low-dose diuretic  8. CAD/CABG. Followup cardiology services as needed. No chest pain or shortness of breath  9. Atrial fibrillation. Cardiac rate control. Continue xarelto  10. Hypothyroidism. Synthroid. Latest TSH level 2.175  11. Klebsiella/Escherichia coli UTI with urinary retention.  -continue levaquin  -I wish to continue voiding trial with cathing prn but may need to consider foley given lack of progress  -needs to be up to void  and need to continue timed voiding attempts  -moved bowels on 2/19   - will try flomax as bp's are better  12. Skin breakdown right heel--   -elevate   -prevalon boot 13. Constipation:  -+results with sorbitol  -miralax, stry sennokot s at night   -add qod suppository  LOS (Days) 6 A FACE TO FACE EVALUATION WAS PERFORMED  [name] T 12/18/2013 8:05 AM

## 2013-12-19 ENCOUNTER — Inpatient Hospital Stay (HOSPITAL_COMMUNITY): Payer: Medicare Other | Admitting: Physical Therapy

## 2013-12-19 ENCOUNTER — Inpatient Hospital Stay (HOSPITAL_COMMUNITY): Payer: Medicare Other

## 2013-12-19 ENCOUNTER — Ambulatory Visit (HOSPITAL_COMMUNITY): Payer: Medicare Other

## 2013-12-19 ENCOUNTER — Encounter (HOSPITAL_COMMUNITY): Payer: Medicare Other

## 2013-12-19 LAB — CBC
HCT: 27.9 % — ABNORMAL LOW (ref 36.0–46.0)
HEMOGLOBIN: 9.2 g/dL — AB (ref 12.0–15.0)
MCH: 26.4 pg (ref 26.0–34.0)
MCHC: 33 g/dL (ref 30.0–36.0)
MCV: 80.2 fL (ref 78.0–100.0)
Platelets: 442 10*3/uL — ABNORMAL HIGH (ref 150–400)
RBC: 3.48 MIL/uL — AB (ref 3.87–5.11)
RDW: 18 % — ABNORMAL HIGH (ref 11.5–15.5)
WBC: 6.8 10*3/uL (ref 4.0–10.5)

## 2013-12-19 LAB — BASIC METABOLIC PANEL
BUN: 10 mg/dL (ref 6–23)
CALCIUM: 8.9 mg/dL (ref 8.4–10.5)
CO2: 27 mEq/L (ref 19–32)
CREATININE: 0.68 mg/dL (ref 0.50–1.10)
Chloride: 98 mEq/L (ref 96–112)
GFR calc Af Amer: 89 mL/min — ABNORMAL LOW (ref >90)
GFR calc non Af Amer: 76 mL/min — ABNORMAL LOW (ref >90)
GLUCOSE: 93 mg/dL (ref 70–99)
Potassium: 4.8 mEq/L (ref 3.7–5.3)
SODIUM: 133 meq/L — AB (ref 137–147)

## 2013-12-19 NOTE — Progress Notes (Signed)
Physical Therapy Note  Patient Details  Name: Maria [name] MRN: [id number] Date of Birth: 17-May-1924 Today's Date: 12/19/2013  0915-1000 (45 minutes) individual (missed 44 minutes/bathroom) Pain: 4/10 right hip Focus of treatment: transfer training; therapeutic exercise focused on bilateral LE strengthening/ AROM RT hip Treatment: Pt in bathroom upon arrival; transfers stand/turn RW min assist for sit to stand from toilet ; sit to supine (mat) min assist RT LE; supine to sit min assist to guard RT LE; pt recalls 2/3 THA precautions on right; ankle pumps X 20 , AA heel slides 2 X 10 with increased guarding noted right hip; BP in supine 117/43 /  127/47 with no complaint of dizziness but reports feeling light headed at times ; nurse notified ; returned to room with all needs within reach.    1300-1340 (40 minutes) individual Pain: 4/10 right hip/premedicated Focus of treatment: therapeutic exercise focused on strengthening RT hip (flexion); gait training Treatment: stepping up/down 4 inch step RT LE with RW support X 10; gait 25 feet X 2 RW close SBA WBAT RT LE ; sit to stand from mat min to close SBA with vcs for foot placement; returned to room with all needs in place.   Willeen Novak,JIM 12/19/2013, 9:54 AM

## 2013-12-19 NOTE — Progress Notes (Signed)
Occupational Therapy Note  Patient Details  Name: Maria [name] MRN: [id number] Date of Birth: Dec 15, 1923 Today's Date: 12/19/2013  Time: 1130-1200 Pt c/o 4/10 pain in RLE; RN aware and repositioned Individual therapy  Pt sitting in w/c upon arrival.  Pt recalled 3/3 hip precautions with min questioning verbal cues.  Focus of session on AE use for LB dressing.  Pt was able to demonstrate use of reacher and sock aid with supervision.  Discussed with patient options for purchasing equipment to use after discharge.  Pt remained in w/c with QRB in place and all needs within reach.   Leotis Shames [facility] 12/19/2013, 12:06 PM

## 2013-12-19 NOTE — Plan of Care (Signed)
Problem: RH BOWEL ELIMINATION Goal: RH STG MANAGE BOWEL WITH ASSISTANCE STG Manage Bowel with min Assistance.  Outcome: Not Progressing LBM 2/22 Goal: RH STG MANAGE BOWEL W/MEDICATION W/ASSISTANCE STG Manage Bowel with Medication with min Assistance.  Outcome: Not Progressing LBM 2/22, suppository given 2/24 with no results, will try sorbitol 2/25  Problem: RH BLADDER ELIMINATION Goal: RH STG MANAGE BLADDER WITH ASSISTANCE STG Manage Bladder With min ssistance  Outcome: Not Progressing Required I&O cath x2 overnight for retention

## 2013-12-19 NOTE — Progress Notes (Signed)
Occupational Therapy Session Note  Patient Details  Name: Maria [name] MRN: [id number] Date of Birth: 07-30-1924  Today's Date: 12/19/2013 Time: 0800-0901 Time Calculation (min): 61 min  Short Term Goals: Week 1:  OT Short Term Goal 1 (Week 1): Patient will complete toilet transfer with Min A OT Short Term Goal 2 (Week 1): Patient will perform upper body bathing and dressing, seated, with supervision and setup OT Short Term Goal 3 (Week 1): Patient will perform lower body bathing with steadying assist while standing OT Short Term Goal 4 (Week 1): Patient will complete lower body dressing using AE, prn, with mod assist OT Short Term Goal 5 (Week 1): Patient will complete toileting with mod assist  Skilled Therapeutic Interventions/Progress Updates:    Pt performed bathing and dressing during session.  She transferred from supine to sit EOB with min assist and then stand pivot from the bed to the wheelchair with mod assist using the RW.  She is able to state 2/3 hip precautions but is unable to remember to not let her right foot turn inward. [name] also needs mod instructional cueing for hand placement with sit to stand as well.  Performed bathing at the sink with min assist for sit to stand using the arm rests for sit to stand.  She demonstrates frequent LOB posteriorly in standing when attempting to wash her peri area or pull up pants, so requires mod facilitation to maintain her balance during these tasks.  Integrated a reacher to work on Psychiatrist socks as well as donning brief and pants.  She needed min instructional cueing with min assist to complete.  Therapist assisted with donning gripper socks as session was concluding.  Transferred to the 3:1 over the toilet with mod assist to conclude session.  PT present to assist with getting pt back to the chair when she finished with toileting tasks.    Therapy Documentation Precautions:  Precautions Precautions: Posterior  Hip;Fall Precaution Booklet Issued: Yes (comment) Precaution Comments: Pt recalls 2/3 precautions Required Braces or Orthoses: Other Brace/Splint Other Brace/Splint: Prevalon Boot for Rt. Heel  Restrictions Weight Bearing Restrictions: No RLE Weight Bearing: Weight bearing as tolerated  Pain: Pain Assessment Pain Assessment: Faces Faces Pain Scale: Hurts little more Pain Type: Surgical pain Pain Location: Hip Pain Orientation: Right Pain Intervention(s): Repositioned;Emotional support ADL: See FIM for current functional status  Therapy/Group: Individual Therapy  Jamy Whyte OTR/L 12/19/2013, 10:55 AM

## 2013-12-19 NOTE — Progress Notes (Signed)
1130; Pt cathed for 350cc after voiding unknown amount;  At 1520 voided large amount ( missed measuring device)  Scanned for 85cc; monitor.   Orthostatics obtained, ( see FS) symptomatic, reports "mild"  dizziness, 3/10, monitor. Mod. BM x3 after sorbitol.

## 2013-12-19 NOTE — Progress Notes (Addendum)
Subjective/Complaints: Reported to me again about hallucinations at night (she has expressed this before)--usually happen more often when she closes her eyes. Began to empty bladder yesterday. Still requiring caths too. Low bp's in therapy yesterday---felt light headed through these.  A 12 point review of systems has been performed and if not noted above is otherwise negative.   Objective: Vital Signs: Blood pressure 129/68, pulse 55, temperature 97.3 F (36.3 C), temperature source Oral, resp. rate 18, height 5' 1"  (1.549 m), weight 52.164 kg (115 lb), SpO2 95.00%. No results found.  Recent Labs  12/19/13 0535  WBC 6.8  HGB 9.2*  HCT 27.9*  PLT 442*    Recent Labs  12/18/13 0458 12/19/13 0535  NA 130* 133*  K 4.8 4.8  CL 96 98  GLUCOSE 97 93  BUN 9 10  CREATININE 0.63 0.68  CALCIUM 8.4 8.9   CBG (last 3)  No results found for this basename: GLUCAP,  in the last 72 hours  Wt Readings from Last 3 Encounters:  12/19/13 52.164 kg (115 lb)  12/07/13 44.453 kg (98 lb)  12/07/13 44.453 kg (98 lb)    Physical Exam:  Constitutional: She is oriented to person, place, and time. No distress.  HENT:  Head: Normocephalic.  Eyes: EOM are normal.  Neck: Normal range of motion. Neck supple. No thyromegaly present.   Cardiovascular:  Cardiac rate controlled. No murmur. Chest incision clean/healed  Respiratory: Effort normal and breath sounds normal. No respiratory distress. No rales or wheezes  GI: Soft. Bowel sounds are normal. She exhibits no real distension.  Neurological: She is alert and oriented to person, place, and time.  Musc: right leg quite tender. Edema 1+ RLE Follows commands. Somewhat distracted.UE's 4+ to 5/5 at deltoid, bicep, tricep, HI. LLE 2HF, 3- KE and 4/5 with ADF/APF. RLE trace hip (pain), 1 KE, 3+ ADF/APF. Sensation appears grossly intact. DTR's 1+  Skin:  Hip incision dry, intact,  , 4cm blood blister on right heel which remains  stable---some wrap around to weight bearing surface of heel..   Assessment/Plan: 1. Functional deficits secondary to right femoral neck fracture which require 3+ hours per day of interdisciplinary therapy in a comprehensive inpatient rehab setting. Physiatrist is providing close team supervision and 24 hour management of active medical problems listed below. Physiatrist and rehab team continue to assess barriers to discharge/monitor patient progress toward functional and medical goals. FIM: FIM - Bathing Bathing Steps Patient Completed: Chest;Right Arm;Left Arm;Abdomen;Front perineal area;Buttocks;Right upper leg;Left upper leg Bathing: 4: Min-Patient completes 8-9 56f10 parts or 75+ percent  FIM - Upper Body Dressing/Undressing Upper body dressing/undressing steps patient completed: Thread/unthread right bra strap;Thread/unthread left bra strap;Thread/unthread right sleeve of pullover shirt/dresss;Thread/unthread left sleeve of pullover shirt/dress;Put head through opening of pull over shirt/dress Upper body dressing/undressing: 4: Min-Patient completed 75 plus % of tasks FIM - Lower Body Dressing/Undressing Lower body dressing/undressing steps patient completed: Pull underwear up/down;Pull pants up/down Lower body dressing/undressing: 2: Max-Patient completed 25-49% of tasks  FIM - Toileting Toileting: 1: Total-Patient completed zero steps, helper did all 3  FIM - TRadio producerDevices: Grab bars Toilet Transfers: 0-Activity did not occur  FIM - BControl and instrumentation engineerDevices: HOB elevated;Bed rails;Arm rests Bed/Chair Transfer: 3: Chair or W/C > Bed: Mod A (lift or lower assist);3: Sit > Supine: Mod A (lifting assist/Pt. 50-74%/lift 2 legs)  FIM - Locomotion: Wheelchair Locomotion: Wheelchair: 2: Travels 50 - 149  ft with minimal assistance (Pt.>75%) FIM - Locomotion: Ambulation Locomotion: Ambulation Assistive Devices: Walker  - Rolling Ambulation/Gait Assistance: 4: Min assist Locomotion: Ambulation: 2: Travels 50 - 149 ft with minimal assistance (Pt.>75%)  Comprehension Comprehension Mode: Auditory Comprehension: 5-Understands complex 90% of the time/Cues < 10% of the time  Expression Expression Mode: Verbal Expression: 5-Expresses complex 90% of the time/cues < 10% of the time  Social Interaction Social Interaction: 5-Interacts appropriately 90% of the time - Needs monitoring or encouragement for participation or interaction.  Problem Solving Problem Solving: 5-Solves basic problems: With no assist  Memory Memory: 4-Recognizes or recalls 75 - 89% of the time/requires cueing 10 - 24% of the time  Medical Problem List and Plan:  1. Right femoral neck fracture secondary to fall. Status post hemiarthroplasty 12/09/2013 weightbearing as tolerated with posterior hip cautions  2. DVT/ Anticoagulation: Xarelto. Has history of RLE DVT  -elevate right leg and compress---improving 3. Pain Management: Zanaflex 2 mg 3 times a day,--will increase HS dose to 38m for HS spasms.  - Ultram 50 mg every 6 hours as  needed pain. May have to back off ultram if she continues to have hallucinations or AMS 4. Neuropsych: This patient is capable of making decisions on her own behalf.   -May be having a little sundowning at night vs med effect 5. Acute blood loss anemia. Latest hemoglobin 9.2. Followup CBC  6. SIADH. Sodium level to 133 today.  Given bp issues will relax FR to 1500 cc   - Continue low-dose Lasix  per nephrology recs  -potassium now nl 7. Diastolic congestive heart failure. No signs of fluid overload. Continue low-dose diuretic  8. CAD/CABG. Followup cardiology services as needed. No chest pain or shortness of breath   -TTH's, check orthostatic vitals  -FR relaxed 9. Atrial fibrillation. Cardiac rate control. Continue xarelto  10. Hypothyroidism. Synthroid. Latest TSH level 2.175  11. Klebsiella/Escherichia  coli UTI with urinary retention.  -levaquin completed. -recheck urine culture tomorrow  - continue voiding trial with cathing prn--starting to show progress  -needs to be up to void and need to continue timed voiding attempts  -continue with bowel rx  -stay with flomax bp permitting  12. Skin breakdown right heel--   -elevate   -prevalon boot  -ordered a darco shoe with heel cut out per [facility] 13. Constipation: still no bm yesterday  -repeat sorbitol today, follow up with suppository and enema if needed  -miralax, sennokot s at night    LOS (Days) 7 A FACE TO FACE EVALUATION WAS PERFORMED  SWARTZ,ZACHARY T 12/19/2013 8:06 AM

## 2013-12-19 NOTE — Progress Notes (Signed)
Nutrition Brief Note  Patient identified via consult for gluten free diet education.   Wt Readings from Last 5 Encounters:  12/19/13 115 lb (52.164 kg)  12/07/13 98 lb (44.453 kg)  12/07/13 98 lb (44.453 kg)  11/29/13 107 lb (48.535 kg)  11/29/13 99 lb (44.906 kg)    Body mass index is 21.74 kg/(m^2). Patient meets criteria for normal weight based on current BMI.   Current diet order is gluten free with 1.5L fluid restriction, patient is consuming approximately 100% of meals at this time. Labs and medications reviewed. Alk phos, AST/ALT elevated but all trending down. Admitted with right femoral neck fx. Pt seen by inpatient RD during admission earlier this month who noted pt with Celiac disease and pt has been following gluten free diet for the past 80 years. Pt had reported at that time she was familiar with gluten free diet at the hospital. Noted pt's weight up 7 pounds since then, however pt with non-pitting RLE edema from surgery.   Pt in the bathroom during RD visit and requested privacy. Discussed purpose of visit with RN who stated she would personally review gluten free diet handout provided by RD with pt after she got out of the bathroom. RN reports pt eating well and better than last admission per daughter's report.   No nutrition interventions warranted at this time. If nutrition issues arise, please re-consult RD.   Mikey College MS, [place], [place] Pager [phone] After Hours Pager

## 2013-12-20 ENCOUNTER — Inpatient Hospital Stay (HOSPITAL_COMMUNITY): Payer: Medicare Other | Admitting: Physical Therapy

## 2013-12-20 ENCOUNTER — Encounter (HOSPITAL_COMMUNITY): Payer: Medicare Other

## 2013-12-20 DIAGNOSIS — Z4889 Encounter for other specified surgical aftercare: Secondary | ICD-10-CM

## 2013-12-20 DIAGNOSIS — I25810 Atherosclerosis of coronary artery bypass graft(s) without angina pectoris: Secondary | ICD-10-CM

## 2013-12-20 DIAGNOSIS — S72009A Fracture of unspecified part of neck of unspecified femur, initial encounter for closed fracture: Secondary | ICD-10-CM

## 2013-12-20 DIAGNOSIS — W010XXA Fall on same level from slipping, tripping and stumbling without subsequent striking against object, initial encounter: Secondary | ICD-10-CM

## 2013-12-20 DIAGNOSIS — I4891 Unspecified atrial fibrillation: Secondary | ICD-10-CM

## 2013-12-20 DIAGNOSIS — D62 Acute posthemorrhagic anemia: Secondary | ICD-10-CM

## 2013-12-20 NOTE — Progress Notes (Signed)
Physical Therapy Weekly Progress Note  Patient Details  Name: Maria [name] MRN: [id number] Date of Birth: Mar 20, 1924  Today's Date: 12/20/2013 First SessionTime: 9532-0233 Time Calculation (min): 35 min  Patient has met 3 of 3 short term goals.  Pt is currently ambulating and transferring at ~min assist level with RW. She is making very slow but steady gains. She needs encouragement for most activity however is responding well to therapies.   Patient continues to demonstrate the following deficits: impaired standing balance, decreased balance reactions, decreased functional mobility, decreased recall of precautions, weakness, pain and therefore will continue to benefit from skilled PT intervention to enhance overall performance with activity tolerance, balance, ability to compensate for deficits, functional use of  right lower extremity and knowledge of precautions.  Patient progressing toward long term goals..  Continue plan of care.  PT Short Term Goals Week 1:  PT Short Term Goal 1 (Week 1): Pt will perform sit <> stands with min assist from bed or w/c.  PT Short Term Goal 1 - Progress (Week 1): Met PT Short Term Goal 2 (Week 1): Pt will transfer bed <> w/c with moderate assistance and LRAD PT Short Term Goal 2 - Progress (Week 1): Met PT Short Term Goal 3 (Week 1): Pt will ambulate x 30' with RW and min assist.  PT Short Term Goal 3 - Progress (Week 1): Met  First SessionSkilled Therapeutic Interventions/Progress Updates:    Pain: Rt hip pain - not rated. Resolves with rest.  Pt unable to wear orthotic shoe to off-weight Rt heel due to inability to fit in Lt shoe (significant Rt/Lt height discrepancy). Husband and son arrived mid-way through session and PT requested larger tennis shoe for Lt foot. Rest of session primarily focused on ambulation x 120' with RW and up to min assist for lateral losses of balance. Mod cues throughout for maintaining precautions.   Second Session Time:   4356-8616 Time Calculation (min): 45 min Skilled Therapeutic Interventions/Progress Updates:  Pain:3/10 Rt hip, premedicated Pt on commode upon entry, practiced standing balance during cleaning with one UE support and addressed safety with mobility in bathroom - min-guard assist. Sit <> stands with railing in hallway focusing on straight alignment of Rt LE (avoiding Rt knee adduction/hip internal rotation). Standing balance side kicking bilaterally, only performed 2 reps standing on Rt LE due to pain in Rt heel. Discussed pressure relief in w/c and bed as pt reporting buttocks starting to get sore. Goal of tomorrow will be to get pt comfortably positioned in semi-rolled position and be able to maintain precautions.   Therapy Documentation Precautions:  Precautions Precautions: Posterior Hip;Fall Precaution Booklet Issued: Yes (comment) Precaution Comments: Pt recalls 2/3 precautions Required Braces or Orthoses: Other Brace/Splint Other Brace/Splint: Prevalon Boot for Rt. Heel  Restrictions Weight Bearing Restrictions: Yes RLE Weight Bearing: Weight bearing as tolerated    See FIM for current functional status  Therapy/Group: Individual Therapy both sessions  Lahoma Rocker 12/20/2013, 12:07 PM

## 2013-12-20 NOTE — Progress Notes (Signed)
Social Work Patient ID: Maria [name], female   DOB: 1924-07-30, 80 y.o.   MRN: [id number]  CSW spoke with pt about team conference discussion and let her know that the targeted d/c date is 01-03-14.  She wants to be home sooner than that, but recognizes that she needs to be here in order to be more independent when she does go home.  Spoke with pt's husband via telephone and he is concerned that pt be able to walk to the dining hall at [employer] from where they park the car.  This distance is similar to the distance from pt's room in 4W02 to the therapy gym.  CSW explained that pt cannot do this now and that her goals may not include that far of a distance for ambulation.  CSW tried to explain to husband that she will need assistance with some ADLs and that she will not be completely independent at first at home.  CSW will follow up with husband again to discuss this to assess understanding and make sure plan for pt to go home is feasible.

## 2013-12-20 NOTE — Progress Notes (Signed)
Note reviewed and accurately reflects treatment session.   

## 2013-12-20 NOTE — Progress Notes (Signed)
Occupational Therapy Session Note  Patient Details  Name: Maria [name] MRN: [id number] Date of Birth: 1924-03-20  Today's Date: 12/20/2013 Time: 1030-1120 Time Calculation (min): 50 min  Short Term Goals: Week 1:  OT Short Term Goal 1 (Week 1): Patient will complete toilet transfer with Min A OT Short Term Goal 2 (Week 1): Patient will perform upper body bathing and dressing, seated, with supervision and setup OT Short Term Goal 3 (Week 1): Patient will perform lower body bathing with steadying assist while standing OT Short Term Goal 4 (Week 1): Patient will complete lower body dressing using AE, prn, with mod assist OT Short Term Goal 5 (Week 1): Patient will complete toileting with mod assist  Skilled Therapeutic Interventions/Progress Updates:    Pt was in wc upon arrival and was able to recite 3/3 hip precautions and recall the reason for the hospital stay. Pt sat in wc at sink to complete bathing/dressing activity.  Pt was able to complete UB bathing with set up assist.  Pt was able to wash L LE and then required assist for R LE due to hip precautions.  Pt required min assist to donn UB clothing and max assist to donn LB clothing, due to time. Pt required help with donning ted hose, and R ted hose was left off for nursing to reapply pad on blister.  Pt stood at sink to pull up underwear and pants and had no c/o pain. Focus on: Increasing independence in ADL performance, safety awareness and increasing activity tolerance.   Therapy Documentation Precautions:  Precautions Precautions: Posterior Hip;Fall Precaution Booklet Issued: Yes (comment) Precaution Comments: Pt recalls 2/3 precautions Required Braces or Orthoses: Other Brace/Splint Other Brace/Splint: Prevalon Boot for Rt. Heel  Restrictions Weight Bearing Restrictions: Yes RLE Weight Bearing: Weight bearing as tolerated   Therapy/Group: Individual Therapy  Renesmay Nesbitt 12/20/2013, 11:56 AM

## 2013-12-20 NOTE — Progress Notes (Signed)
Physical Therapy Note  Patient Details  Name: LUCELLA POMMIER MRN: [id number] Date of Birth: January 30, 1924 Today's Date: 12/20/2013  Time: 830-930 60 minutes  1:1 Pt c/o R hip pain during all mobility, eases with rest.  Bed mobility with mod A for R LE and trunk.  Sit to stands multiple times for dressing tasks with min A for balance.  Gait training in controlled environment multiple attempts with min A/close supervision. Pt continues with step to pattern due to fear of falling.  Pt able to gait 30' x 3, 50' x 2, 100'.  Seated therex knee flex/ext with AAROM, AP, hip abd AAROM.  Pt requires frequent rest breaks due to pain.  No orthostatic hypotension during session.   Kandise Riehle 12/20/2013, 9:27 AM

## 2013-12-20 NOTE — Progress Notes (Signed)
Subjective/Complaints: Reported to me again about hallucinations at [facility] involves nurses coming in when they really have not Began to empty bladder yesterday. Still requiring caths too. Low bp's in therapy yesterday-- Asking about urine tests, A 12 point review of systems has been performed and if not noted above is otherwise negative.   Objective: Vital Signs: Blood pressure 103/56, pulse 70, temperature 97.1 F (36.2 C), temperature source Oral, resp. rate 18, height 5' 1"  (1.549 m), weight 52.164 kg (115 lb), SpO2 97.00%. No results found.  Recent Labs  12/19/13 0535  WBC 6.8  HGB 9.2*  HCT 27.9*  PLT 442*    Recent Labs  12/18/13 0458 12/19/13 0535  NA 130* 133*  K 4.8 4.8  CL 96 98  GLUCOSE 97 93  BUN 9 10  CREATININE 0.63 0.68  CALCIUM 8.4 8.9   CBG (last 3)  No results found for this basename: GLUCAP,  in the last 72 hours  Wt Readings from Last 3 Encounters:  12/19/13 52.164 kg (115 lb)  12/07/13 44.453 kg (98 lb)  12/07/13 44.453 kg (98 lb)    Physical Exam:  Constitutional: She is oriented to person, place, and time. No distress.  HENT:  Head: Normocephalic.  Eyes: EOM are normal.  Neck: Normal range of motion. Neck supple. No thyromegaly present.   Cardiovascular:  Cardiac rate controlled. No murmur. Chest incision clean/healed  Respiratory: Effort normal and breath sounds normal. No respiratory distress. No rales or wheezes  GI: Soft. Bowel sounds are normal. She exhibits no real distension.  Neurological: She is alert and oriented to person, place, and time.  Musc: right leg quite tender. Edema 1+ RLE Follows commands. Somewhat distracted.UE's 4+ to 5/5 at deltoid, bicep, tricep, HI. LLE 2HF, 3- KE and 4/5 with ADF/APF. RLE trace hip (pain), 1 KE, 3+ ADF/APF. Sensation appears grossly intact. DTR's 1+  Skin:  Hip incision dry, intact,  , no surrounding Ecchymosis  Assessment/Plan: 1. Functional deficits secondary to right  femoral neck fracture which require 3+ hours per day of interdisciplinary therapy in a comprehensive inpatient rehab setting. Physiatrist is providing close team supervision and 24 hour management of active medical problems listed below. Physiatrist and rehab team continue to assess barriers to discharge/monitor patient progress toward functional and medical goals. FIM: FIM - Bathing Bathing Steps Patient Completed: Chest;Right Arm;Left Arm;Abdomen;Front perineal area;Buttocks;Right upper leg;Left upper leg;Left lower leg (including foot) Bathing: 4: Min-Patient completes 8-9 17f10 parts or 75+ percent  FIM - Upper Body Dressing/Undressing Upper body dressing/undressing steps patient completed: Thread/unthread left bra strap;Thread/unthread right bra strap;Thread/unthread right sleeve of pullover shirt/dresss;Thread/unthread left sleeve of pullover shirt/dress;Put head through opening of pull over shirt/dress;Pull shirt over trunk Upper body dressing/undressing: 4: Min-Patient completed 75 plus % of tasks FIM - Lower Body Dressing/Undressing Lower body dressing/undressing steps patient completed: Thread/unthread right pants leg;Thread/unthread left pants leg;Pull pants up/down Lower body dressing/undressing: 2: Max-Patient completed 25-49% of tasks  FIM - Toileting Toileting steps completed by patient: Adjust clothing prior to toileting;Adjust clothing after toileting Toileting Assistive Devices: Grab bar or rail for support Toileting: 3: Mod-Patient completed 2 of 3 steps  FIM - TRadio producerDevices: Grab bars Toilet Transfers: 4-To toilet/BSC: Min A (steadying Pt. > 75%);4-From toilet/BSC: Min A (steadying Pt. > 75%)  FIM - Bed/Chair Transfer Bed/Chair Transfer Assistive Devices: HOB elevated;Bed rails;Arm rests Bed/Chair Transfer: 4: Supine > Sit: Min A (steadying Pt. > 75%/lift 1 leg);3:  Sit > Supine: Mod A (lifting assist/Pt. 50-74%/lift 2 legs)  FIM -  Locomotion: Wheelchair Locomotion: Wheelchair: 2: Travels 50 - 149 ft with minimal assistance (Pt.>75%) FIM - Locomotion: Ambulation Locomotion: Ambulation Assistive Devices: Administrator Ambulation/Gait Assistance: 4: Min assist Locomotion: Ambulation: 2: Travels 50 - 149 ft with minimal assistance (Pt.>75%)  Comprehension Comprehension Mode: Auditory Comprehension: 5-Understands complex 90% of the time/Cues < 10% of the time  Expression Expression Mode: Verbal Expression: 5-Expresses complex 90% of the time/cues < 10% of the time  Social Interaction Social Interaction: 5-Interacts appropriately 90% of the time - Needs monitoring or encouragement for participation or interaction.  Problem Solving Problem Solving: 5-Solves basic problems: With no assist  Memory Memory: 4-Recognizes or recalls 75 - 89% of the time/requires cueing 10 - 24% of the time  Medical Problem List and Plan:  1. Right femoral neck fracture secondary to fall. Status post hemiarthroplasty 12/09/2013 weightbearing as tolerated with posterior hip cautions  2. DVT/ Anticoagulation: Xarelto. Has history of RLE DVT  -elevate right leg and compress---improving 3. Pain Management: Zanaflex 2 mg 3 times a day,--will increase HS dose to 52m for HS spasms.  - Ultram 50 mg every 6 hours as  needed pain. May have to back off ultram if she continues to have hallucinations or AMS 4. Neuropsych: This patient is capable of making decisions on her own behalf.   -May be having a little sundowning at night vs med effect 5. Acute blood loss anemia. Latest hemoglobin 9.2. Followup CBC  6. SIADH. Sodium level to 133 today.  Given bp issues will relax FR to 1500 cc   - Continue low-dose Lasix  per nephrology recs  -potassium now nl 7. Diastolic congestive heart failure. No signs of fluid overload. Continue low-dose diuretic  8. CAD/CABG. Followup cardiology services as needed. No chest pain or shortness of breath   -TTH's,  check orthostatic vitals  -FR relaxed 9. Atrial fibrillation. Cardiac rate control. Continue xarelto  10. Hypothyroidism. Synthroid. Latest TSH level 2.175  11. Klebsiella/Escherichia coli UTI with urinary retention.  -levaquin completed. -recheck urine culture tomorrow  - continue voiding trial with cathing prn--starting to show progress  -needs to be up to void and need to continue timed voiding attempts  -continue with bowel rx  -stay with flomax bp permitting  12. Skin breakdown right heel--   -elevate   -prevalon boot  -ordered a darco shoe with heel cut out per [facility] [employer] 13. Constipation: still no bm yesterday  -repeat sorbitol today, follow up with suppository and enema if needed  -miralax, sennokot s at night    LOS (Days) 8 A FACE TO FACE EVALUATION WAS PERFORMED  KIRSTEINS,ANDREW E 12/20/2013 8:00 AM

## 2013-12-21 ENCOUNTER — Ambulatory Visit (HOSPITAL_COMMUNITY): Payer: Medicare Other

## 2013-12-21 ENCOUNTER — Inpatient Hospital Stay (HOSPITAL_COMMUNITY): Payer: Medicare Other | Admitting: Physical Therapy

## 2013-12-21 ENCOUNTER — Inpatient Hospital Stay (HOSPITAL_COMMUNITY): Payer: Medicare Other

## 2013-12-21 MED ORDER — SENNOSIDES-DOCUSATE SODIUM 8.6-50 MG PO TABS
2.0000 | ORAL_TABLET | Freq: Two times a day (BID) | ORAL | Status: DC
  Administered 2013-12-21 – 2014-01-03 (×18): 2 via ORAL
  Filled 2013-12-21 (×24): qty 2

## 2013-12-21 NOTE — Progress Notes (Signed)
Occupational Therapy Session Note  Patient Details  Name: SHAQUETTA ARCOS MRN: [id number] Date of Birth: 05-28-1924  Today's Date: 12/21/2013 Time: 1015-1105 Time Calculation (min): 50 min  Skilled Therapeutic Interventions/Progress Updates:  ADL-retraining at sink with focus on improved activity tolerance, lower body dressing skills, and dynamic standing balance.   Patient received in her recliner, receptive to sink bathing due to limited time (45 min).   Patient performed transfer from recliner to w/c and completed bathing at sink, sitting for upper body bathing, and standing to pull up her pants during assisted lower body dressing.   Patient deferred washing buttocks and peri-area d/t recent assist with toilet hygiene.    Patient was advised again to request family assist with purchase of hip kit to reinforce training on use of AE (written message for family provided this session).    Patient now able to pull up her pants while standing with steadying assist.   Patient requires max assist with lower body bathing and dressing due to no reacher or LH sponge present this session to complete ADL at right LE.    Therapy Documentation Precautions:  Precautions Precautions: Posterior Hip;Fall Precaution Booklet Issued: Yes (comment) Precaution Comments: Pt recalls 2/3 precautions Required Braces or Orthoses: Other Brace/Splint Other Brace/Splint: Prevalon Boot for Rt. Heel  Restrictions Weight Bearing Restrictions: Yes RLE Weight Bearing: Weight bearing as tolerated  Pain: 6/10 at right LE during activity   ADL: ADL ADL Comments: see FIM  See FIM for current functional status  Therapy/Group: Individual Therapy  Second session: Time: 1330-1430 Time Calculation (min):  60 min  Pain Assessment: 3/10, right hip  Skilled Therapeutic Interventions: Therapeutic activity with focus on improved standing balance, endurance, sit <> stand, and transfers.   Patient performed selected standing  activity, re-arranging flowers, for 35+ minutes, supporting herself with RW and right arm on table top while removing, clipping, and replacing flowers in 1 display.   Patient required only 2 rest breaks but used time productively while seated to clean flower pot in sink.   Patient completed sit <> 5 times, consistently with no more than min assist to rise (pt=85%) and min verbal cues for foot placement.   Patient performed 1 transfer: w/c <> toilet with only contact guard (min assist) for safety, using grab bar without need for cues or setup assist.   Patient making good functional progress although limited by hypersensitivity to pain extending from right hip to foot.   See FIM for current functional status  Therapy/Group: Individual Therapy  [place] 12/21/2013, 1:23 PM

## 2013-12-21 NOTE — Progress Notes (Signed)
Occupational Therapy Weekly Progress Note  Patient Details  Name: Maria [name] MRN: [id number] Date of Birth: 1924/07/22  Today's Date: 12/21/2013  Patient has met 3 of 5 short term goals.  Patient is progressing with lower body dressing skills but has not purchased or provided her own AE needed to progress to improve independence.  Patient continues to demonstrate the following deficits: Pain management with R-LE, impaired endurance, impaired dynamic standing balance, impaired transfer skills and therefore will continue to benefit from skilled OT intervention to enhance overall performance with BADL.  Patient progressing toward long term goals..  Continue plan of care.  OT Short Term Goals Week 1:  OT Short Term Goal 1 (Week 1): Patient will complete toilet transfer with Min A OT Short Term Goal 1 - Progress (Week 1): Met OT Short Term Goal 2 (Week 1): Patient will perform upper body bathing and dressing, seated, with supervision and setup OT Short Term Goal 2 - Progress (Week 1): Met OT Short Term Goal 3 (Week 1): Patient will perform lower body bathing with steadying assist while standing OT Short Term Goal 3 - Progress (Week 1): Progressing toward goal OT Short Term Goal 4 (Week 1): Patient will complete lower body dressing using AE, prn, with mod assist OT Short Term Goal 4 - Progress (Week 1): Progressing toward goal OT Short Term Goal 5 (Week 1): Patient will complete toileting with mod assist OT Short Term Goal 5 - Progress (Week 1): Met Week 2:  OT Short Term Goal 1 (Week 2): Patient will perform lower body bathing with steadying assist while standing OT Short Term Goal 2 (Week 2): Patient will complete lower body dressing using AE, prn, with mod assist OT Short Term Goal 3 (Week 2): Patient will complete toilet transfer with supervision OT Short Term Goal 4 (Week 2): Patient will complete toilet hygeine and clothing managmenet with min assist  Therapy  Documentation Precautions:  Precautions Precautions: Posterior Hip;Fall Precaution Booklet Issued: Yes (comment) Precaution Comments: Pt recalls 2/3 precautions Required Braces or Orthoses: Other Brace/Splint Other Brace/Splint: Prevalon Boot for Rt. Heel  Restrictions Weight Bearing Restrictions: Yes RLE Weight Bearing: Weight bearing as tolerated  Vital Signs: Therapy Vitals Pulse Rate: 67 Resp: 18 BP: 119/64 mmHg Patient Position, if appropriate: Sitting Oxygen Therapy SpO2: 98 % O2 Device: None (Room air)  Pain: 6/10 during activity   ADL: ADL ADL Comments: see FIM  [name] 12/21/2013, 4:25 PM

## 2013-12-21 NOTE — Progress Notes (Signed)
Physical Therapy Session Note  Patient Details  Name: Maria [name] MRN: [id number] Date of Birth: October 10, 1924  Today's Date: 12/21/2013 Time: 3825-0539 Time Calculation (min): 60 min  Short Term Goals: Week 2:  PT Short Term Goal 1 (Week 2): Pt will transfer sit <> stand with supervision PT Short Term Goal 2 (Week 2): Pt will ambulate 54' with supervision and RW PT Short Term Goal 3 (Week 2): Pt will verbalize 3/3 posterior hip precautions and require <3 verbal cues to maintain precautions during 60 min therapy session.  Skilled Therapeutic Interventions/Progress Updates:   Pain: Grimaces with bed mobility, not rated - called for pain medication  Had long discussion of assistance available at D/C. Discussed assisted living as opposed to independent living - pt states she is not going to even "think about it." Pt concerned about where car will be parked and reports that she and her husband will "give up" if placed in assisted living. Pt had trouble maintaining on topic during this discussion. Discussed use of in-home caregivers instead - pt much more willing to consider this.   Practiced rolling in bed and getting out of bed on Lt (simulated home set-up) with use of leg lifter - mod /max verbal cues for precautions, min assist overall. Ambulation to/from bathroom, min-guard assist over uneven floor with RW, cues for avoiding internal hip rotation particularly with turns to Rt. Standing balance while cleaning self, donning brief, and hand cleansing in bathroom and at sink - min-guard assist. W/C pushups x 10 reps educated on pressure relief. Pt left in recliner per pt request.   Second Session Time:  1300-1330 Time Calculation (min): 30 min Skilled Therapeutic Interventions/Progress Updates:  Pain: pt reported comfort from [facility] but did not mention pain.  Pt FINALLY has shoe that fits Lt foot (although shoe has to be left unzipped) and now able to wear Rt Darco boot with heel cut-out -  pt reports improved comfort with gait. Pt ambulated x 230' with RW min assist from room to gym, intermittently able to continually progress RW and demo step-through gait however after short time of this reverts back to step-to gait with inconsistent gait speed.Cues for equal weight bearing Rt/Lt LEs and safe proximity of RW. Heel lift added Lt shoe however still feels uneven after 15' additional gait, may benefit from slightly higher heel lift however due to shoe type we may be limited with adjustments.   Therapy Documentation Precautions:  Precautions Precautions: Posterior Hip;Fall Precaution Booklet Issued: Yes (comment) Precaution Comments: Pt recalls 2/3 precautions Required Braces or Orthoses: Other Brace/Splint Other Brace/Splint: Prevalon Boot for Rt. Heel  Restrictions Weight Bearing Restrictions: Yes RLE Weight Bearing: Weight bearing as tolerated  See FIM for current functional status  Therapy/Group: Individual Therapy both sessions  [place] Rocker 12/21/2013, 3:05 PM

## 2013-12-21 NOTE — Progress Notes (Signed)
Subjective/Complaints:  Asking about urine tests, A 12 point review of systems has been performed and if not noted above is otherwise negative.   Objective: Vital Signs: Blood pressure 103/58, pulse 63, temperature 98.1 F (36.7 C), temperature source Oral, resp. rate 18, height 5' 1"  (1.549 m), weight 52.164 kg (115 lb), SpO2 94.00%. No results found.  Recent Labs  12/19/13 0535  WBC 6.8  HGB 9.2*  HCT 27.9*  PLT 442*    Recent Labs  12/19/13 0535  NA 133*  K 4.8  CL 98  GLUCOSE 93  BUN 10  CREATININE 0.68  CALCIUM 8.9   CBG (last 3)  No results found for this basename: GLUCAP,  in the last 72 hours  Wt Readings from Last 3 Encounters:  12/19/13 52.164 kg (115 lb)  12/07/13 44.453 kg (98 lb)  12/07/13 44.453 kg (98 lb)    Physical Exam:  Constitutional: She is oriented to person, place, and time. No distress.  HENT:  Head: Normocephalic.  Eyes: EOM are normal.  Neck: Normal range of motion. Neck supple. No thyromegaly present.   Cardiovascular:  Cardiac rate controlled. Irreg No murmur. Chest incision clean/healed  Respiratory: Effort normal and breath sounds normal. No respiratory distress. No rales or wheezes  GI: Soft. Bowel sounds are normal. She exhibits no real distension.  Neurological: She is alert and oriented to person, place, and time.  Musc: right leg quite tender. Edema 1+ RLE Follows commandsSkin:  Hip incision dry, intact,  , no surrounding Ecchymosis  Assessment/Plan: 1. Functional deficits secondary to right femoral neck fracture which require 3+ hours per day of interdisciplinary therapy in a comprehensive inpatient rehab setting. Physiatrist is providing close team supervision and 24 hour management of active medical problems listed below. Physiatrist and rehab team continue to assess barriers to discharge/monitor patient progress toward functional and medical goals. FIM: FIM - Bathing Bathing Steps Patient Completed:  Chest;Right Arm;Left Arm;Abdomen;Right upper leg;Left upper leg;Left lower leg (including foot) Bathing: 3: Mod-Patient completes 5-7 25f10 parts or 50-74%  FIM - Upper Body Dressing/Undressing Upper body dressing/undressing steps patient completed: Thread/unthread right bra strap;Thread/unthread left bra strap;Thread/unthread right sleeve of pullover shirt/dresss;Thread/unthread left sleeve of pullover shirt/dress;Put head through opening of pull over shirt/dress;Pull shirt over trunk Upper body dressing/undressing: 4: Min-Patient completed 75 plus % of tasks FIM - Lower Body Dressing/Undressing Lower body dressing/undressing steps patient completed: Pull underwear up/down;Pull pants up/down;Don/Doff left sock Lower body dressing/undressing: 2: Max-Patient completed 25-49% of tasks  FIM - Toileting Toileting steps completed by patient: Adjust clothing prior to toileting;Adjust clothing after toileting Toileting Assistive Devices: Grab bar or rail for support Toileting: 3: Mod-Patient completed 2 of 3 steps  FIM - TRadio producerDevices: Grab bars Toilet Transfers: 4-To toilet/BSC: Min A (steadying Pt. > 75%);4-From toilet/BSC: Min A (steadying Pt. > 75%)  FIM - Bed/Chair Transfer Bed/Chair Transfer Assistive Devices: HOB elevated;Bed rails;Arm rests Bed/Chair Transfer: 4: Supine > Sit: Min A (steadying Pt. > 75%/lift 1 leg);3: Sit > Supine: Mod A (lifting assist/Pt. 50-74%/lift 2 legs)  FIM - Locomotion: Wheelchair Locomotion: Wheelchair: 2: Travels 50 - 149 ft with minimal assistance (Pt.>75%) FIM - Locomotion: Ambulation Locomotion: Ambulation Assistive Devices: WAdministratorAmbulation/Gait Assistance: 4: Min assist Locomotion: Ambulation: 2: Travels 50 - 149 ft with minimal assistance (Pt.>75%)  Comprehension Comprehension Mode: Auditory Comprehension: 5-Understands complex 90% of the time/Cues < 10% of the time  Expression Expression Mode:  Verbal Expression: 5-Expresses complex 90% of the time/cues < 10% of the time  Social Interaction Social Interaction: 5-Interacts appropriately 90% of the time - Needs monitoring or encouragement for participation or interaction.  Problem Solving Problem Solving: 5-Solves basic problems: With no assist  Memory Memory: 4-Recognizes or recalls 75 - 89% of the time/requires cueing 10 - 24% of the time  Medical Problem List and Plan:  1. Right femoral neck fracture secondary to fall. Status post hemiarthroplasty 12/09/2013 weightbearing as tolerated with posterior hip cautions  2. DVT/ Anticoagulation: Xarelto. Has history of RLE DVT  -elevate right leg and compress---improving 3. Pain Management: Zanaflex 2 mg 3 times a day,--will increase HS dose to 47m for HS spasms.  - Ultram 50 mg every 6 hours as  needed pain. May have to back off ultram if she continues to have hallucinations or AMS 4. Neuropsych: This patient is capable of making decisions on her own behalf.   -May be having a little sundowning at night vs med effect 5. Acute blood loss anemia. Latest hemoglobin 9.2. Followup CBC  6. SIADH. Sodium level to 133 today.  Given bp issues will relax FR to 1500 cc   - Continue low-dose Lasix  per nephrology recs  -potassium now nl 7. Diastolic congestive heart failure. No signs of fluid overload. Continue low-dose diuretic  8. CAD/CABG. Followup cardiology services as needed. No chest pain or shortness of breath   -TTH's, check orthostatic vitals  -FR relaxed 9. Atrial fibrillation. Cardiac rate control. Continue xarelto  10. Hypothyroidism. Synthroid. Latest TSH level 2.175  11. Klebsiella/Escherichia coli UTI with urinary retention.  -levaquin completed. -recheck urine culture tomorrow  - continue voiding trial with cathing prn--starting to show progress  -needs to be up to void and need to continue timed voiding attempts  -continue with bowel rx  -stay with flomax bp  permitting  12. Skin breakdown right heel--   -elevate   -prevalon boot  -ordered a darco shoe with heel cut out per [employer] 13. Constipation: still no bm yesterday  ,augment program  LOS (Days) 9 A FACE TO FACE EVALUATION WAS PERFORMED  KCharlett Blake2/27/2015 6:47 AM

## 2013-12-21 NOTE — Progress Notes (Signed)
Nursing Note: Pt up to the bathroom to void.A: PVR=440cc.Pt in and out ctahed by [facility] for 425 cc.Pt tolerated well.wbb

## 2013-12-22 ENCOUNTER — Inpatient Hospital Stay (HOSPITAL_COMMUNITY): Payer: Medicare Other | Admitting: Physical Therapy

## 2013-12-22 DIAGNOSIS — Z7901 Long term (current) use of anticoagulants: Secondary | ICD-10-CM

## 2013-12-22 DIAGNOSIS — S72009A Fracture of unspecified part of neck of unspecified femur, initial encounter for closed fracture: Secondary | ICD-10-CM

## 2013-12-22 DIAGNOSIS — Z5181 Encounter for therapeutic drug level monitoring: Secondary | ICD-10-CM

## 2013-12-22 DIAGNOSIS — I495 Sick sinus syndrome: Secondary | ICD-10-CM

## 2013-12-22 LAB — URINE CULTURE

## 2013-12-22 MED ORDER — TRAMADOL HCL 50 MG PO TABS
50.0000 mg | ORAL_TABLET | Freq: Four times a day (QID) | ORAL | Status: DC | PRN
  Administered 2013-12-22 – 2014-01-02 (×14): 50 mg via ORAL
  Filled 2013-12-22 (×21): qty 1

## 2013-12-22 NOTE — Progress Notes (Signed)
Maria [name] is a 80 y.o. female 10-May-1924 [id number]  Subjective: C/o R hip pain w/PT. No new problems. Slept well. Feeling OK.  Objective: Vital signs in last 24 hours: Temp:  [98.3 F (36.8 C)] 98.3 F (36.8 C) (02/28 0543) Pulse Rate:  [64-67] 64 (02/28 0546) Resp:  [18] 18 (02/28 0543) BP: (105-121)/(64-71) 105/65 mmHg (02/28 0554) SpO2:  [94 %-98 %] 94 % (02/28 0543) Weight change:  Last BM Date: 12/21/13  Intake/Output from previous day: 02/27 0701 - 02/28 0700 In: 840 [P.O.:840] Out: 500 [Urine:500] Last cbgs: CBG (last 3)  No results found for this basename: GLUCAP,  in the last 72 hours   Physical Exam General: No apparent distress   Lungs: Normal effort. Lungs clear to auscultation, no crackles or wheezes. Cardiovascular: Regular rate and rhythm, no edema Abdomen: S/NT/ND; BS(+) Musculoskeletal:  Unchanged - R hip area is tender Neurological: No new neurological deficits Wounds: R hip Skin: clear  Aging changes Mental state: Alert, oriented, cooperative    Lab Results: BMET    Component Value Date/Time   NA 133* 12/19/2013 0535   K 4.8 12/19/2013 0535   CL 98 12/19/2013 0535   CO2 27 12/19/2013 0535   GLUCOSE 93 12/19/2013 0535   BUN 10 12/19/2013 0535   CREATININE 0.68 12/19/2013 0535   CALCIUM 8.9 12/19/2013 0535   GFRNONAA 76* 12/19/2013 0535   GFRAA 89* 12/19/2013 0535   CBC    Component Value Date/Time   WBC 6.8 12/19/2013 0535   RBC 3.48* 12/19/2013 0535   HGB 9.2* 12/19/2013 0535   HCT 27.9* 12/19/2013 0535   PLT 442* 12/19/2013 0535   MCV 80.2 12/19/2013 0535   MCH 26.4 12/19/2013 0535   MCHC 33.0 12/19/2013 0535   RDW 18.0* 12/19/2013 0535   LYMPHSABS 1.1 12/13/2013 0613   MONOABS 0.8 12/13/2013 0613   EOSABS 0.3 12/13/2013 0613   BASOSABS 0.0 12/13/2013 0613    Studies/Results: No results found.  Medications: I have reviewed the patient's current medications.  Assessment/Plan:  1. Right femoral neck fracture secondary to fall. Status  post hemiarthroplasty 12/09/2013 weightbearing as tolerated with posterior hip cautions . Ice pack prn 2. DVT/ Anticoagulation: Xarelto. Has history of RLE DVT  -elevate right leg and compress---improving  3. Pain Management: Zanaflex 2 mg 3 times a day,--will increase HS dose to 81m for HS spasms.  - Ultram 50 mg every 6 hours as  needed pain. May have to back off ultram if she continues to have hallucinations or AMS  4. Neuropsych: This patient is capable of making decisions on her own behalf.  -May be having a little sundowning at night vs med effect  5. Acute blood loss anemia. Latest hemoglobin 9.2. Followup CBC  6. SIADH. Sodium level to 133 today. Given bp issues will relax FR to 1500 cc  - Continue low-dose Lasix per nephrology recs  -potassium now nl  7. Diastolic congestive heart failure. No signs of fluid overload. Continue low-dose diuretic  8. CAD/CABG. Followup cardiology services as needed. No chest pain or shortness of breath  -TTH's, check orthostatic vitals  -FR relaxed  9. Atrial fibrillation. Cardiac rate control. Continue xarelto  10. Hypothyroidism. Synthroid. Latest TSH level 2.175  11. Klebsiella/Escherichia coli UTI with urinary retention.  -levaquin completed. -recheck urine culture tomorrow  - continue voiding trial with cathing prn--starting to show progress  -needs to be up to void and need to continue timed voiding attempts  -continue with bowel rx  -  stay with flomax bp permitting  12. Skin breakdown right heel--  -elevate  -prevalon boot  -ordered a darco shoe with heel cut out per [facility]  13. Constipation: still no bm yesterday  ,augment program     Length of stay, days: 10  Walker Kehr , MD 12/22/2013, 10:04 AM

## 2013-12-22 NOTE — Progress Notes (Signed)
Physical Therapy Session Note  Patient Details  Name: Maria [name] MRN: [id number] Date of Birth: August 29, 1924  Today's Date: 12/22/2013 Time: 1400-1500 Time Calculation (min): 60 min  Therapy Documentation Precautions:  Precautions Precautions: Posterior Hip;Fall Precaution Booklet Issued: Yes (comment) Precaution Comments: Pt recalls 2/3 precautions Required Braces or Orthoses: Other Brace/Splint Other Brace/Splint: Prevalon Boot for Rt. Heel  Restrictions Weight Bearing Restrictions: Yes RLE Weight Bearing: Weight bearing as tolerated Vital Signs: Therapy Vitals Pulse Rate: 57 Resp: 18 BP: 95/53 mmHg in sitting and 107/49 in standing without c/o light headedness. Patient Position, if appropriate: Sitting Oxygen Therapy SpO2: 97 % O2 Device: None (Room air) Pain: Pain Assessment Pain Score: 2   Walking Group with PT Tech Patient brought to [place] in w/c and had RW with her. R Prevalon Heel Boot was on.  Gait using RW 3 x 120', and 2 x 50' with S/min-assist.  No episodes of LOB. Patient able to participate with 2# ankle weights for B knee extensions in sitting.  Therapy/Group: Group Therapy  Eevee Borbon J 12/22/2013, 5:20 PM

## 2013-12-23 DIAGNOSIS — I10 Essential (primary) hypertension: Secondary | ICD-10-CM

## 2013-12-23 DIAGNOSIS — I2510 Atherosclerotic heart disease of native coronary artery without angina pectoris: Secondary | ICD-10-CM

## 2013-12-23 MED ORDER — LINACLOTIDE 145 MCG PO CAPS
145.0000 ug | ORAL_CAPSULE | Freq: Every day | ORAL | Status: DC
  Administered 2013-12-23 – 2014-01-03 (×12): 145 ug via ORAL
  Filled 2013-12-23 (×16): qty 1

## 2013-12-23 NOTE — Progress Notes (Signed)
Maria [name] is a 80 y.o. female [birth year] [id number]  Subjective: C/o R hip pain w/PT - better. No new problems. Slept well. Feeling OK. C/o constipation.  Objective: Vital signs in last 24 hours: Temp:  [97.6 F (36.4 C)-98.7 F (37.1 C)] 97.6 F (36.4 C) (03/01 0513) Pulse Rate:  [57-82] 82 (03/01 0513) Resp:  [18] 18 (03/01 0513) BP: (100-122)/(58-72) 100/58 mmHg (03/01 0513) SpO2:  [93 %-97 %] 93 % (03/01 0513) Weight change:  Last BM Date: 01/19/14  Intake/Output from previous day: 02/28 0701 - 03/01 0700 In: 240 [P.O.:240] Out: 1250 [Urine:1250] Last cbgs: CBG (last 3)  No results found for this basename: GLUCAP,  in the last 72 hours   Physical Exam General: No apparent distress   Lungs: Normal effort. Lungs clear to auscultation, no crackles or wheezes. Cardiovascular: Regular rate and rhythm, no edema Abdomen: S/NT/ND; BS(+) Musculoskeletal:  Unchanged - R hip area is tender Neurological: No new neurological deficits Wounds: R hip Skin: clear  Aging changes Mental state: Alert, oriented, cooperative    Lab Results: BMET    Component Value Date/Time   NA 133* 12/19/2013 0535   K 4.8 12/19/2013 0535   CL 98 12/19/2013 0535   CO2 27 12/19/2013 0535   GLUCOSE 93 12/19/2013 0535   BUN 10 12/19/2013 0535   CREATININE 0.68 12/19/2013 0535   CALCIUM 8.9 12/19/2013 0535   GFRNONAA 76* 12/19/2013 0535   GFRAA 89* 12/19/2013 0535   CBC    Component Value Date/Time   WBC 6.8 12/19/2013 0535   RBC 3.48* 12/19/2013 0535   HGB 9.2* 12/19/2013 0535   HCT 27.9* 12/19/2013 0535   PLT 442* 12/19/2013 0535   MCV 80.2 12/19/2013 0535   MCH 26.4 12/19/2013 0535   MCHC 33.0 12/19/2013 0535   RDW 18.0* 12/19/2013 0535   LYMPHSABS 1.1 12/13/2013 0613   MONOABS 0.8 12/13/2013 0613   EOSABS 0.3 12/13/2013 0613   BASOSABS 0.0 12/13/2013 0613    Studies/Results: No results found.  Medications: I have reviewed the patient's current medications.  Assessment/Plan:  1. Right  femoral neck fracture secondary to fall. Status post hemiarthroplasty 12/09/2013 weightbearing as tolerated with posterior hip cautions . Ice pack prn 2. DVT/ Anticoagulation: Xarelto. Has history of RLE DVT  -elevate right leg and compress---improving  3. Pain Management: Zanaflex 2 mg 3 times a day,--will increase HS dose to 46m for HS spasms.  - Ultram 50 mg every 6 hours as  needed pain. May have to back off ultram if she continues to have hallucinations or AMS  4. Neuropsych: This patient is capable of making decisions on her own behalf.  -May be having a little sundowning at night vs med effect  5. Acute blood loss anemia. Latest hemoglobin 9.2. Followup CBC  6. SIADH. Sodium level to 133 today. Given bp issues will relax FR to 1500 cc  - Continue low-dose Lasix per nephrology recs  -potassium now nl  7. Diastolic congestive heart failure. No signs of fluid overload. Continue low-dose diuretic  8. CAD/CABG. Followup cardiology services as needed. No chest pain or shortness of breath  -TTH's, check orthostatic vitals  -FR relaxed  9. Atrial fibrillation. Cardiac rate control. Continue xarelto  10. Hypothyroidism. Synthroid. Latest TSH level 2.175  11. Klebsiella/Escherichia coli UTI with urinary retention.  -levaquin completed. -recheck urine culture tomorrow  - continue voiding trial with cathing prn--starting to show progress  -needs to be up to void and need to continue timed voiding  attempts  -continue with bowel rx  -stay with flomax bp permitting  12. Skin breakdown right heel--  -elevate  -prevalon boot  -ordered a darco shoe with heel cut out per [facility]  13. Constipation: still no bm yesterday  augment program - start [place]      Length of stay, days: [place] , MD 12/23/2013, 2:46 PM

## 2013-12-24 ENCOUNTER — Inpatient Hospital Stay (HOSPITAL_COMMUNITY): Payer: Medicare Other | Admitting: Physical Therapy

## 2013-12-24 ENCOUNTER — Inpatient Hospital Stay (HOSPITAL_COMMUNITY): Payer: Medicare Other | Admitting: Occupational Therapy

## 2013-12-24 ENCOUNTER — Inpatient Hospital Stay (HOSPITAL_COMMUNITY): Payer: Medicare Other

## 2013-12-24 ENCOUNTER — Ambulatory Visit (HOSPITAL_COMMUNITY): Payer: Medicare Other

## 2013-12-24 DIAGNOSIS — I25810 Atherosclerosis of coronary artery bypass graft(s) without angina pectoris: Secondary | ICD-10-CM

## 2013-12-24 DIAGNOSIS — I4891 Unspecified atrial fibrillation: Secondary | ICD-10-CM

## 2013-12-24 DIAGNOSIS — S72009A Fracture of unspecified part of neck of unspecified femur, initial encounter for closed fracture: Secondary | ICD-10-CM

## 2013-12-24 DIAGNOSIS — Z4889 Encounter for other specified surgical aftercare: Secondary | ICD-10-CM

## 2013-12-24 DIAGNOSIS — W010XXA Fall on same level from slipping, tripping and stumbling without subsequent striking against object, initial encounter: Secondary | ICD-10-CM

## 2013-12-24 DIAGNOSIS — D62 Acute posthemorrhagic anemia: Secondary | ICD-10-CM

## 2013-12-24 MED ORDER — SORBITOL 70 % SOLN
60.0000 mL | Freq: Once | Status: AC
  Administered 2013-12-24: 60 mL via ORAL

## 2013-12-24 NOTE — Progress Notes (Signed)
Occupational Therapy Session Note  Patient Details  Name: Maria [name] MRN: [id number] Date of Birth: 09/18/1924  Today's Date: 12/24/2013 Time: 1130-1200 Time Calculation (min): 30 min  Short Term Goals: Week 1:  OT Short Term Goal 1 (Week 1): Patient will complete toilet transfer with Min A OT Short Term Goal 1 - Progress (Week 1): Met OT Short Term Goal 2 (Week 1): Patient will perform upper body bathing and dressing, seated, with supervision and setup OT Short Term Goal 2 - Progress (Week 1): Met OT Short Term Goal 3 (Week 1): Patient will perform lower body bathing with steadying assist while standing OT Short Term Goal 3 - Progress (Week 1): Progressing toward goal OT Short Term Goal 4 (Week 1): Patient will complete lower body dressing using AE, prn, with mod assist OT Short Term Goal 4 - Progress (Week 1): Progressing toward goal OT Short Term Goal 5 (Week 1): Patient will complete toileting with mod assist OT Short Term Goal 5 - Progress (Week 1): Met  Week 2:  OT Short Term Goal 1 (Week 2): Patient will perform lower body bathing with steadying assist while standing OT Short Term Goal 2 (Week 2): Patient will complete lower body dressing using AE, prn, with mod assist OT Short Term Goal 3 (Week 2): Patient will complete toilet transfer with supervision OT Short Term Goal 4 (Week 2): Patient will complete toilet hygeine and clothing managmenet with min assist  Skilled Therapeutic Interventions/Progress Updates:  Patient found with PT in dayroom. Patient with no complaints of pain. Therapist propelled patient > ADL apartment for therapeutic activity focusing on dynamic standing balance/tolerance/endurance, functional mobility/ambulation using RW, safety with use of RW in tight spaces, general RW safety, and overall activity tolerance/endurance. For activity, patient worked in [place] on putting items away in cabinets. At end of session, therapist propelled patient back to room  and left patient seated in w/c with all needed items within reach.   Precautions:  Precautions Precautions: Posterior Hip;Fall Precaution Booklet Issued: Yes (comment) Precaution Comments: Pt recalls 2/3 precautions Required Braces or Orthoses: Other Brace/Splint Other Brace/Splint: Prevalon Boot for Rt. Heel  Restrictions Weight Bearing Restrictions: Yes RLE Weight Bearing: Weight bearing as tolerated  See FIM for current functional status  Therapy/Group: Individual Therapy  Tien Aispuro 12/24/2013, 12:14 PM

## 2013-12-24 NOTE — Progress Notes (Signed)
Physical Therapy Session Note  Patient Details  Name: Maria [name] MRN: [id number] Date of Birth: 1924/06/15  Today's Date: 12/24/2013 Time: 0915-1000 Time Calculation (min): 45 min  Short Term Goals: Week 1:  PT Short Term Goal 1 (Week 1): Pt will perform sit <> stands with min assist from bed or w/c.  PT Short Term Goal 1 - Progress (Week 1): Met PT Short Term Goal 2 (Week 1): Pt will transfer bed <> w/c with moderate assistance and LRAD PT Short Term Goal 2 - Progress (Week 1): Met PT Short Term Goal 3 (Week 1): Pt will ambulate x 30' with RW and min assist.  PT Short Term Goal 3 - Progress (Week 1): Met  Skilled Therapeutic Interventions/Progress Updates:    Pt appears slightly more confused today? Decreased recall of practice from last week which is concerning for return home. Discussed increased level of assist (other than husband) upon D/C home - pt resistant to most suggestions and in a little bit of denial about current deficits. Bed mobility (with leg lifter)/transfers performed with min assist and max verbal cues. Ambulation x 160' with RW and min assist for intermittant loss of balance particularly with turns.    Therapy Documentation Precautions:  Precautions Precautions: Posterior Hip;Fall Precaution Booklet Issued: Yes (comment) Precaution Comments: Pt recalls 2/3 precautions Required Braces or Orthoses: Other Brace/Splint Other Brace/Splint: Prevalon Boot for Rt. Heel  Restrictions Weight Bearing Restrictions: Yes RLE Weight Bearing: Weight bearing as tolerated Pain:  Rt hip - not rated but grimaces with most movement.   See FIM for current functional status  Therapy/Group: Individual Therapy  Lahoma Rocker 12/24/2013, 12:35 PM

## 2013-12-24 NOTE — Progress Notes (Signed)
Nursing Note: Pt up to the bathroom and stated that when she got up ,she heard a pop.She is not having anymore pain than usual but was a little concerned and felt she should let me know. Pt up w/o any problems or apin and back to w/c easily.A; Dan Anguiili,pa made aware.No new ordres.wbb

## 2013-12-24 NOTE — Progress Notes (Addendum)
1510 all sutures removed . From incisional line on R hip . Done aseptically . Tolerated well by Pt .  Kept open to air . No gape , no redness noted

## 2013-12-24 NOTE — Progress Notes (Addendum)
Physical Therapy Note  Patient Details  Name: NEILAH FULWIDER MRN: [id number] Date of Birth: 1924-04-09 Today's Date: 12/24/2013 1115-1130, 15 min 1635-1655, 20 min  individual therapy Tx 1:  Pain R hip 4/10; pt will request meds at end of session  W/c propulsion x 50' on level tile, supervision.  Tx focused on gait with RW on carpet, advanced gait  Gait x 50' on carpet with supervision, cues for upright posture and orienting RW during turns.  Gait stepping R x 10' and L x 10' with RW, observing hip precautions, with close supervision, cues for best sequence with RW.  Discussed and explained pt's LE swelling; reiterated importance of L ankle pumps. Pt stated she feels SCDs are helpful at night.  Tx 2:  Pt stated she needed to use toilet for BM.  Pt incontinent of B and B in diaper.  She had large loose stool on toilet.  See FIM.  Pt stood x 3 minutes during hygiene, using wall bar with supervision.  Gait in room with RW to sink and recliner with S/min assist; pt very distracted and somewhat impulsive due to visitor in room.  Pt's husband questioning pt's status at D/C; her safety if he is not able to be with her 24/7.  This therapist referred him to pt's main therapists to discuss d/c.  Ambika Zettlemoyer 12/24/2013, 7:56 AM

## 2013-12-24 NOTE — Progress Notes (Signed)
Occupational Therapy Session Note  Patient Details  Name: Maria [name] MRN: [id number] Date of Birth: [birth year]  Today's Date: 12/24/2013 Time: 0730-0830 Time Calculation (min): 60 min  Short Term Goals: Week 2:  OT Short Term Goal 1 (Week 2): Patient will perform lower body bathing with steadying assist while standing OT Short Term Goal 2 (Week 2): Patient will complete lower body dressing using AE, prn, with mod assist OT Short Term Goal 3 (Week 2): Patient will complete toilet transfer with supervision OT Short Term Goal 4 (Week 2): Patient will complete toilet hygeine and clothing managmenet with min assist  Skilled Therapeutic Interventions/Progress Updates: ADL-retraining with focus on lower body dressing using AE, prn, clothing management, and improved dynamic standing balance.   Patient received in w/c, finishing her breakfast, dressed in hospital gown.   Patient reported completion of lower body bathing following BM with assisted peri-area care prior to treatment.   Patient completed dressing task, at w/c level, standing with mod assist for support to pull up pants.   Patient displayed AE purchased as advised but reported minimal benefit from use of reacher to aid in lower body dressing.   OT re-educated patient on postural control required and improved anterior pelvic tilt to use reacher effectively when donning pants.     Therapy Documentation Precautions:  Precautions Precautions: Posterior Hip;Fall Precaution Booklet Issued: Yes (comment) Precaution Comments: Pt recalls 2/3 precautions Required Braces or Orthoses: Other Brace/Splint Other Brace/Splint: Prevalon Boot for Rt. Heel  Restrictions Weight Bearing Restrictions: Yes RLE Weight Bearing: Weight bearing as tolerated  Vital Signs: Therapy Vitals Pulse Rate: 65 Resp: 18 BP: 117/47 mmHg Patient Position, if appropriate: Sitting Oxygen Therapy SpO2: 100 % O2 Device: None (Room air)  Pain: Pain  Assessment Pain Assessment: 0-10 Pain Score: 2   ADL: ADL ADL Comments: see FIM  See FIM for current functional status  Therapy/Group: Individual Therapy  Second session: Time: 9292-4462 Time Calculation (min):  45 min  Pain Assessment: 5/10  Skilled Therapeutic Interventions: ADL-retraining with focus on functional mobility using RW, transfers in/out of bed on left side of bed, w/o using of bed rail or trapeze, using leg lifter, prn.   Patient able to complete functional mobility from recliner to bed, ambulate approx 15' in room and complete transfer to/from bed using only leg lifter to bring both legs into bed.   From supine in bed, patient performed pelvic lifts to move toward head of bed, unassisted, using left leg, as needed to advance.   No LOB noted during mobility and while standing at sink to wash out flower pot, with standby assist.  Patient requires extra time to process instructions and repeated redirection when distracted by presence of multiple providers, RN and OT (possible deficit with alternating attention).  See FIM for current functional status  Therapy/Group: Individual Therapy  Delorice Bannister 12/24/2013, 11:33 AM

## 2013-12-25 ENCOUNTER — Inpatient Hospital Stay (HOSPITAL_COMMUNITY): Payer: Medicare Other | Admitting: Physical Therapy

## 2013-12-25 ENCOUNTER — Inpatient Hospital Stay (HOSPITAL_COMMUNITY): Payer: Medicare Other

## 2013-12-25 DIAGNOSIS — M259 Joint disorder, unspecified: Secondary | ICD-10-CM | POA: Diagnosis not present

## 2013-12-25 DIAGNOSIS — Z96649 Presence of unspecified artificial hip joint: Secondary | ICD-10-CM | POA: Diagnosis not present

## 2013-12-25 LAB — CBC
HCT: 27.1 % — ABNORMAL LOW (ref 36.0–46.0)
Hemoglobin: 9 g/dL — ABNORMAL LOW (ref 12.0–15.0)
MCH: 26.7 pg (ref 26.0–34.0)
MCHC: 33.2 g/dL (ref 30.0–36.0)
MCV: 80.4 fL (ref 78.0–100.0)
Platelets: 468 10*3/uL — ABNORMAL HIGH (ref 150–400)
RBC: 3.37 MIL/uL — ABNORMAL LOW (ref 3.87–5.11)
RDW: 18.7 % — AB (ref 11.5–15.5)
WBC: 7.4 10*3/uL (ref 4.0–10.5)

## 2013-12-25 LAB — BASIC METABOLIC PANEL
BUN: 10 mg/dL (ref 6–23)
CO2: 22 mEq/L (ref 19–32)
Calcium: 8.6 mg/dL (ref 8.4–10.5)
Chloride: 102 mEq/L (ref 96–112)
Creatinine, Ser: 0.61 mg/dL (ref 0.50–1.10)
GFR calc non Af Amer: 79 mL/min — ABNORMAL LOW (ref >90)
GLUCOSE: 98 mg/dL (ref 70–99)
POTASSIUM: 4 meq/L (ref 3.7–5.3)
SODIUM: 136 meq/L — AB (ref 137–147)

## 2013-12-25 MED ORDER — TRAMADOL HCL 50 MG PO TABS
50.0000 mg | ORAL_TABLET | ORAL | Status: AC
  Administered 2013-12-25: 50 mg via ORAL

## 2013-12-25 MED ORDER — METHOCARBAMOL 500 MG PO TABS
500.0000 mg | ORAL_TABLET | Freq: Four times a day (QID) | ORAL | Status: DC | PRN
  Administered 2013-12-25 – 2013-12-31 (×4): 500 mg via ORAL
  Filled 2013-12-25 (×5): qty 1

## 2013-12-25 MED ORDER — TRAMADOL HCL 50 MG PO TABS
50.0000 mg | ORAL_TABLET | ORAL | Status: AC
  Administered 2013-12-25: 50 mg via ORAL
  Filled 2013-12-25: qty 1

## 2013-12-25 NOTE — Plan of Care (Signed)
Problem: RH BLADDER ELIMINATION Goal: RH STG MANAGE BLADDER WITH ASSISTANCE STG Manage Bladder With min ssistance  Outcome: Not Progressing Required in and out cath for 700 cc.Pt was having pain and stated she hurt too bed to move and was cathed for 700cc.

## 2013-12-25 NOTE — Progress Notes (Signed)
Physical Therapy Session Note  Patient Details  Name: Maria [name] MRN: [id number] Date of Birth: [birth year]  Today's Date: 12/25/2013 Time: 1100-1145 Time Calculation (min): 45 min  Short Term Goals: Week 2:  PT Short Term Goal 1 (Week 2): Pt will transfer sit <> stand with supervision PT Short Term Goal 2 (Week 2): Pt will ambulate 27' with supervision and RW PT Short Term Goal 3 (Week 2): Pt will verbalize 3/3 posterior hip precautions and require <3 verbal cues to maintain precautions during 60 min therapy session.  Skilled Therapeutic Interventions/Progress Updates:    Pt discussing incident with Rt hip from last night - reports Rt hip popped standing up from commode, did not hurt for several hours afterwards while propelling around room that night then later in the night she woke up with significant increase in pain. Spoke with PA who cleared pt for activity as tolerated - slow progression. PT brought wedge from gym for positioning on Lt side (per pt request), educated on pillow placement between legs to prevent adduction. Pt able to perform bed mobility with min/mod assist and mod sequencing cues to maintain precautions. Stand pivot transfer with RW to recliner performed with min/min-guard assist.  Overall pt not demonstrating significant change in transitional movements however was premedicated, appears anxious about hip.   Therapy Documentation Precautions:  Precautions Precautions: Posterior Hip;Fall Precaution Booklet Issued: Yes (comment) Precaution Comments: Pt recalls 2/3 precautions Required Braces or Orthoses: Other Brace/Splint Other Brace/Splint: Prevalon Boot for Rt. Heel  Restrictions Weight Bearing Restrictions: Yes RLE Weight Bearing: Weight bearing as tolerated General: Amount of Missed PT Time (min): 15 Minutes (pt fatigue) Vital Signs:   Pain:  5-6 with mobility, reports pain as "pulling" and also has pain when someone touches any aspect of leg.   See  FIM for current functional status  Therapy/Group: Individual Therapy  Lahoma Rocker 12/25/2013, 12:16 PM

## 2013-12-25 NOTE — Plan of Care (Signed)
Problem: RH PAIN MANAGEMENT Goal: RH STG PAIN MANAGED AT OR BELOW PT'S PAIN GOAL 3 or less out of 10  Outcome: Not Progressing Pt had has had more pain tonight.X-ray to r/o fx or dislocation was negative.

## 2013-12-25 NOTE — Progress Notes (Signed)
Subjective/Complaints:  Had a "pop" in right hip while getting back from toilet to bed last evening. Pain acutely increased since then. Didn't sleep much last night.   A 12 point review of systems has been performed and if not noted above is otherwise negative.   Objective: Vital Signs: Blood pressure 135/56, pulse 71, temperature 98.1 F (36.7 C), temperature source Oral, resp. rate 18, height 5' 1"  (1.549 m), weight 52.164 kg (115 lb), SpO2 97.00%. Dg Hip Complete Right  12/25/2013   CLINICAL DATA:  Rule out dislocation.  Recent right hip surgery  EXAM: RIGHT HIP - COMPLETE 2+ VIEW  COMPARISON:  12/07/2013  FINDINGS: Bipolar right hip hemiarthroplasty. The prosthesis is located. No periprosthetic fracture, although limited field of view in the frontal projection. No evidence of fracture or or dislocation involving the pelvic ring and left hip.  IMPRESSION: Right hip hemiarthroplasty.  No adverse features.   Electronically Signed   By: Jorje Guild M.D.   On: 12/25/2013 03:32    Recent Labs  12/25/13 0505  WBC 7.4  HGB 9.0*  HCT 27.1*  PLT 468*    Recent Labs  12/25/13 0505  NA 136*  K 4.0  CL 102  GLUCOSE 98  BUN 10  CREATININE 0.61  CALCIUM 8.6   CBG (last 3)  No results found for this basename: GLUCAP,  in the last 72 hours  Wt Readings from Last 3 Encounters:  12/19/13 52.164 kg (115 lb)  12/07/13 44.453 kg (98 lb)  12/07/13 44.453 kg (98 lb)    Physical Exam:  Constitutional: She is oriented to person, place, and time. No distress.  HENT:  Head: Normocephalic.  Eyes: EOM are normal.  Neck: Normal range of motion. Neck supple. No thyromegaly present.   Cardiovascular:  Cardiac rate controlled. Irreg No murmur. Chest incision clean/healed  Respiratory: Effort normal and breath sounds normal. No respiratory distress. No rales or wheezes  GI: Soft. Bowel sounds are normal. She exhibits no real distension.  Neurological: She is alert and oriented  to person, place, and time.  Musc: right leg quite tender. Edema 1+ RLE Follows commandsSkin:  Hip incision dry, intact,  Tender to touch along area of incision. Hip/leg aligned. Pain in right hip with basic PROM Psych: anxious  Assessment/Plan: 1. Functional deficits secondary to right femoral neck fracture which require 3+ hours per day of interdisciplinary therapy in a comprehensive inpatient rehab setting. Physiatrist is providing close team supervision and 24 hour management of active medical problems listed below. Physiatrist and rehab team continue to assess barriers to discharge/monitor patient progress toward functional and medical goals. FIM: FIM - Bathing Bathing Steps Patient Completed: Chest;Right Arm;Left Arm;Abdomen;Right upper leg;Left upper leg Bathing: 3: Mod-Patient completes 5-7 86f10 parts or 50-74%  FIM - Upper Body Dressing/Undressing Upper body dressing/undressing steps patient completed: Thread/unthread right sleeve of pullover shirt/dresss;Thread/unthread left sleeve of pullover shirt/dress;Pull shirt over trunk;Put head through opening of pull over shirt/dress Upper body dressing/undressing: 5: Set-up assist to: Obtain clothing/put away FIM - Lower Body Dressing/Undressing Lower body dressing/undressing steps patient completed: Thread/unthread left underwear leg;Thread/unthread left pants leg;Pull pants up/down;Don/Doff left sock;Don/Doff left shoe Lower body dressing/undressing: 3: Mod-Patient completed 50-74% of tasks  FIM - Toileting Toileting steps completed by patient: Adjust clothing prior to toileting;Performs perineal hygiene;Adjust clothing after toileting Toileting Assistive Devices: Grab bar or rail for support Toileting: 1: Total-Patient completed zero steps, helper did all 3  FIM - Toilet Transfers  Toilet Transfers Assistive Devices: Product manager Transfers: 5-To toilet/BSC: Supervision (verbal cues/safety issues);5-From toilet/BSC: Supervision  (verbal cues/safety issues)  FIM - Engineer, site Assistive Devices: Bed rails Bed/Chair Transfer: 5: Supine > Sit: Supervision (verbal cues/safety issues);4: Bed > Chair or W/C: Min A (steadying Pt. > 75%);4: Chair or W/C > Bed: Min A (steadying Pt. > 75%)  FIM - Locomotion: Wheelchair Locomotion: Wheelchair: 0: Activity did not occur FIM - Locomotion: Ambulation Locomotion: Ambulation Assistive Devices: Administrator Ambulation/Gait Assistance: 4: Min assist Locomotion: Ambulation: 2: Travels 50 - 149 ft with minimal assistance (Pt.>75%)  Comprehension Comprehension Mode: Auditory Comprehension: 5-Understands complex 90% of the time/Cues < 10% of the time  Expression Expression Mode: Verbal Expression: 5-Expresses complex 90% of the time/cues < 10% of the time  Social Interaction Social Interaction: 5-Interacts appropriately 90% of the time - Needs monitoring or encouragement for participation or interaction.  Problem Solving Problem Solving: 4-Solves basic 75 - 89% of the time/requires cueing 10 - 24% of the time  Memory Memory: 4-Recognizes or recalls 75 - 89% of the time/requires cueing 10 - 24% of the time  Medical Problem List and Plan:  1. Right femoral neck fracture secondary to fall. Status post hemiarthroplasty 12/09/2013 weightbearing as tolerated with posterior hip cautions   -increased pain--xrays negative for fx or dislocation, may be spasms. Add robaxin, use heat/ ice  -will contact ortho as a follow up given acutely increased pain 2. DVT/ Anticoagulation: Xarelto. Has history of RLE DVT  -elevate right leg and compress-- 3. Pain Management: Zanaflex 2 mg 3 times a day,--will increase HS dose to 51m for HS spasms.  - Ultram 50 mg every 6 hours as   -robaxin as above needed pain. May have to back off ultram if she continues to have hallucinations or AMS 4. Neuropsych: This patient is capable of making decisions on her own behalf.    -May be having a little sundowning at night vs med effect 5. Acute blood loss anemia. Latest hemoglobin 9.2. Followup CBC  6. SIADH. Sodium level to 133 today.  Given bp issues will relax FR to 1500 cc   - Continue low-dose Lasix  per nephrology recs  -potassium now nl 7. Diastolic congestive heart failure. No signs of fluid overload. Continue low-dose diuretic  8. CAD/CABG. Followup cardiology services as needed. No chest pain or shortness of breath   -TTH's, check orthostatic vitals  -FR relaxed 9. Atrial fibrillation. Cardiac rate control. Continue xarelto  10. Hypothyroidism. Synthroid. Latest TSH level 2.175  11. Klebsiella/Escherichia coli UTI with urinary retention.  -levaquin completed. -recheck urine culture pending  - continue voiding trial with cathing prn--starting to show progress  -needs to be up to void and need to continue timed voiding attempts  -continue with bowel rx  -stay with flomax bp permitting  12. Skin breakdown right heel--   -elevate   -prevalon boot  -ordered a darco shoe with heel cut out per [facility] 13. Constipation:   -had bm on 3/1   LOS (Days) 13 A FACE TO FACE EVALUATION WAS PERFORMED  Maria [name] T 12/25/2013 8:01 AM

## 2013-12-25 NOTE — Progress Notes (Addendum)
Nursing Note: Pt back from X-ray.Will wait results.wbb

## 2013-12-25 NOTE — Progress Notes (Signed)
Occupational Therapy Session Note  Patient Details  Name: Maria [name] MRN: [id number] Date of Birth: 04-09-1924  Today's Date: 12/25/2013 Time: 0930-1030 Time Calculation (min): 60 min  Short Term Goals: Week 2:  OT Short Term Goal 1 (Week 2): Patient will perform lower body bathing with steadying assist while standing OT Short Term Goal 2 (Week 2): Patient will complete lower body dressing using AE, prn, with mod assist OT Short Term Goal 3 (Week 2): Patient will complete toilet transfer with supervision OT Short Term Goal 4 (Week 2): Patient will complete toilet hygeine and clothing managmenet with min assist  Skilled Therapeutic Interventions/Progress Updates: Therapeutic exercises with focus on upper extremity strengthening.   Patient received supine in bed, HOB elevated, reporting continued pain at right hip, poor sleep, and refusing to attempt transfer to w/c or ADL due to her perception of conversation with provider.   Patient stated she would await ortho MD advice before attempting mobility using R-LE.   Patient agreed to engage in upper body strengthening in exercises in bed to improve performance with transfers.   Patient performed 3 sets of 10 reps, shoulder, arms, forearm strengthening with manual facilitation to anchor thera-band and 1:1 supervision on performance of exercises.   Patient also referenced weakness at bil hands as limiting her ability to open containers (beverages and condiments).   OT provided thera-putty with written exercises and 1:1 supervision to perform 2 of 11 exercises.     Therapy Documentation Precautions:  Precautions Precautions: Posterior Hip;Fall Precaution Booklet Issued: Yes (comment) Precaution Comments: Pt recalls 2/3 precautions Required Braces or Orthoses: Other Brace/Splint Other Brace/Splint: Prevalon Boot for Rt. Heel  Restrictions Weight Bearing Restrictions: Yes RLE Weight Bearing: Weight bearing as tolerated General:   Vital  Signs:   Pain:   ADL: ADL ADL Comments: see FIM Exercises:   Other Treatments:    See FIM for current functional status  Therapy/Group: Individual Therapy  Second session: Time: 1330-1400 Time Calculation (min):  30 min  Pain Assessment: No pain  Skilled Therapeutic Interventions: ADL-retraining with focus on AE training for lower body dressing.   Patient received in w/c wearing hospital gown, family present.   Patient reported MD contact at/near noon confirming no adverse change to right hip or limitation with performance in therapy.   Pt able to participate in dressing, sitting/standing at sink, using AE.    Patient completed upper body dressing seated, lower body dressing, using reacher skillfully to don pants while seated, standing at sink, supported by left hand at sink while pulling up her pants, unassisted.   No lob noted while standing.   See FIM for current functional status  Therapy/Group: Individual Therapy  Shizuko Wojdyla 12/25/2013, 11:04 AM

## 2013-12-25 NOTE — Progress Notes (Signed)
Nursing Note: X-ray negative for fx or dislocation.Pt asking how she can void as she hurts too bad to get up and try to void.A: Pt scanned for >700.R: Pt in and out cathed for 700 cc by Kevin,NT the patient tolerated well.wbb

## 2013-12-25 NOTE — Progress Notes (Signed)
Physical Therapy Session Note  Patient Details  Name: Maria [name] MRN: [id number] Date of Birth: 04-15-1924  Today's Date: 12/25/2013 Time: 4098-1191 Time Calculation (min): 40 min  Short Term Goals: Week 2:  PT Short Term Goal 1 (Week 2): Pt will transfer sit <> stand with supervision PT Short Term Goal 2 (Week 2): Pt will ambulate 89' with supervision and RW PT Short Term Goal 3 (Week 2): Pt will verbalize 3/3 posterior hip precautions and require <3 verbal cues to maintain precautions during 60 min therapy session.  Skilled Therapeutic Interventions/Progress Updates:   Focused session on overall activity tolerance, functional and dynamic gait training with RW, basic transfers, and standing therex for functional strengthening. Pt able to gait for short distances and through obstacle course to simulate home environment with close S; cues for safe management of RW and adhering to precautions during turns. Pt S for sit to stands but at times required min A for stand to sit. Standing therex for hip abduction for functional strengthening x 2 sets of 10 reps each. Pt with complaints of feeling lightheaded during activities but BP 140/72 mmHg. Pt reports not eating much today, juice given and encouraged to eat a snack upon return to room.   Therapy Documentation Precautions:  Precautions Precautions: Posterior Hip;Fall Precaution Booklet Issued: Yes (comment) Precaution Comments: Pt recalls 2/3 precautions Required Braces or Orthoses: Other Brace/Splint Other Brace/Splint: Prevalon Boot for Rt. Heel  Restrictions Weight Bearing Restrictions: Yes RLE Weight Bearing: Weight bearing as tolerated Pain:  reports pain is significantly improved in hip - no current complaints.  See FIM for current functional status  Therapy/Group: Individual Therapy  Canary Brim [facility] 12/25/2013, 4:11 PM

## 2013-12-25 NOTE — Progress Notes (Signed)
Nursing Note: Pt called and asking for more pain meds.Pt was medicated around 2340 and it is too soon for pain meds.Pt states she is really hurting and is moaning in pain.This is very different for her.Pt normally sleeps all night.A: Paged on call and obtained order to repeat ultam 50 mg and for r hip x-ray to r/o dislocation.wbb

## 2013-12-25 NOTE — Progress Notes (Signed)
Pt is currently in IP rehab following a right hemi hip and felt a pop and increased pain in the hip.  Discussed pt's recent pop in her right hip and her x ray results.  These were reviewed by dr. Mayer Camel and read as normal.  The pt has been following the posterior hip protocols and is doing well with therapy and her cues for safety.  Pt was told that the pop may have simply been soft tissue and scar tissue.  She has minimal pain with log roll and ROM to both hips.  Pt is told to follow up as previously scheduled with Dr. Mayer Camel.

## 2013-12-26 ENCOUNTER — Ambulatory Visit (HOSPITAL_COMMUNITY): Payer: Medicare Other

## 2013-12-26 ENCOUNTER — Inpatient Hospital Stay (HOSPITAL_COMMUNITY): Payer: Medicare Other | Admitting: Physical Therapy

## 2013-12-26 ENCOUNTER — Inpatient Hospital Stay (HOSPITAL_COMMUNITY): Payer: Medicare Other

## 2013-12-26 MED ORDER — NITROFURANTOIN MONOHYD MACRO 100 MG PO CAPS
100.0000 mg | ORAL_CAPSULE | Freq: Two times a day (BID) | ORAL | Status: DC
  Administered 2013-12-26 – 2013-12-28 (×5): 100 mg via ORAL
  Filled 2013-12-26 (×8): qty 1

## 2013-12-26 MED ORDER — SORBITOL 70 % SOLN
60.0000 mL | Status: AC
  Administered 2013-12-26: 30 mL via ORAL

## 2013-12-26 NOTE — Progress Notes (Signed)
Pt declined sorbitol this am; requested at 1530 after therapies, given; monitor;  Give supp after dinner per pts timed request.   Voided large amt at 1500, scanned for 159cc. No cath at this time.

## 2013-12-26 NOTE — Patient Care Conference (Signed)
Inpatient RehabilitationTeam Conference and Plan of Care Update Date: 12/25/2013   Time: 2:00 PM    Patient Name: Maria [name]      Medical Record Number: [id number]  Date of Birth: 05-27-1924 Sex: Female         Room/Bed: 4W02C/4W02C-01 Payor Info: Payor: MEDICARE / Plan: MEDICARE PART A AND B / Product Type: *No Product type* /    Admitting Diagnosis: FN FX  Admit Date/Time:  12/12/2013  6:05 PM Admission Comments: No comment available   Primary Diagnosis:  <principal problem not specified> Principal Problem: <principal problem not specified>  Patient Active Problem List   Diagnosis Date Noted  . Femoral neck fracture 12/12/2013  . Atonic bladder 12/11/2013  . Acute confusional state 12/11/2013  . Hyponatremia 12/08/2013  . Fall 12/08/2013  . Closed right hip fracture 12/07/2013  . UTI (lower urinary tract infection) 12/07/2013  . Hip fracture, right 12/07/2013  . Bradycardia 11/07/2013  . Anticoagulation goal of INR 2.5 to 3.5 11/07/2013  . Protein-calorie malnutrition, severe 10/04/2013  . DVT (deep venous thrombosis) 10/03/2013  . Pleural effusion 09/15/2013  . Hx of CABG 09/12/2013  . Malnutrition of mild degree 09/03/2013  . Respiratory failure, post-operative 08/29/2013  . Coronary atherosclerosis of native coronary artery 08/16/2013    Class: Acute  . Mitral regurgitation 08/07/2013  . Pulmonary hypertension, moderate to severe 08/07/2013  . Chronic diastolic heart failure 08/07/2013    Class: Chronic  . Essential hypertension, benign 08/02/2013  . Nontoxic uninodular goiter 08/02/2013  . Unspecified hereditary and idiopathic peripheral neuropathy 08/02/2013  . Occlusion and stenosis of carotid artery without mention of cerebral infarction 08/02/2013  . Unspecified vitamin D deficiency 08/02/2013  . Depressive disorder, not elsewhere classified 08/02/2013  . Paroxysmal atrial fibrillation 08/02/2013  . Trifascicular block 08/02/2013  . Gastric ulcer,  unspecified as acute or chronic, without mention of hemorrhage, perforation, or obstruction 08/02/2013  . Sinoatrial node dysfunction 08/02/2013  . Other dyspnea and respiratory abnormality 08/02/2013  . Encounter for long-term (current) use of other medications 08/02/2013  . NONSPECIFIC ABNORMAL FIND RAD&OTH EXAM GI TRACT 05/30/2008    Expected Discharge Date: Expected Discharge Date: 01/03/14  Team Members Present: Physician leading conference: Dr. Faith Rogue Social Worker Present: Staci Acosta, LCSW Nurse Present: Carlean Purl, RN PT Present: Karolee Stamps, Jerrye Bushy, PT OT Present: Donzetta Kohut, Loistine Chance, OT;Patricia River[place], OT SLP Present: Feliberto Gottron, SLP PPS Coordinator present : Tora Duck, RN, CRRN;Becky Henrene Dodge, PT     Current Status/Progress Goal Weekly Team Focus  Medical   pain issues at times. xrays good. bladder emptying better. sodium no wnl  improve exercise tolerance and pain control  see prior   Bowel/Bladder   Occasional I/O cath due to retention; PVRs decreased overall.    Multi. bowel meds; BM QOD to Q3days.  Continent B/B.  No retention; BM QD-QOD with scheduled meds; occasional PRN  Continue PVR's; OOB for all toileting needs; Continue bowel meds.   Swallow/Nutrition/ Hydration             ADL's   Supervision and setup upper body ADL, Min A lower body ADL, Min A transfers, Min A standing balance  Supervision transfers and seated upper body ADL, Min A lower body dressing and standing balance  Endurance, functional mobility using RW, standing balance during ADL, AE training, pain managmenet   Mobility   min assist sit <> stands and ambulation  supervision  activity tolerance, pain modulation, standing balance/reactions, precautions, safety.  Communication             Safety/Cognition/ Behavioral Observations  No unsafe behaviors; Calls appropriately. STMD's evident, bed alarm.Marland KitchenMarland KitchenMarland KitchenCues for hip precautions.  No falls, no injury  this admission. Increased safety awareness.  Monitor, cue for precautions.   Pain   Schediled Zanaflex, PRN Ultram effective  Managed 2/10  Monitor; offer PRNs.   Skin   Sutures D/C'd; OTA, mild edema. No drainage. Right heel with SDTI, foam dressing.  No S/S infection; Right heel in healing process at D/C.  Prafo to LLE, float right heel; monitor incision line.    Rehab Goals Patient on target to meet rehab goals: Yes Rehab Goals Revised: None *See Care Plan and progress notes for long and short-term goals.  Barriers to Discharge: pain, bladder    Possible Resolutions to Barriers:  double void, extra time to void.    Discharge Planning/Teaching Needs:  Pt plans to return to her one story independent living home at [facility].  She will have 24/7 assistance from her husband and intermittent from her children.  Pt is considering hiring caregivers for several hours a day vs. going to the skilled unit at [facility] for a little bit.  Family is very willing and able to complete any family education.   Team Discussion:  Pt with more hip pain then before, but x-ray has shown nothing new is wrong with her hip.  Bladder is improving, still with some bowel issues.  Pt's sodium level is better and kidneys are better.  OT and PT feel pt is progressing well and on target to meet goals by d/c date.  Revisions to Treatment Plan:  None   Continued Need for Acute Rehabilitation Level of Care: The patient requires daily medical management by a physician with specialized training in physical medicine and rehabilitation for the following conditions: Daily direction of a multidisciplinary physical rehabilitation program to ensure safe treatment while eliciting the highest outcome that is of practical value to the patient.: Yes Daily medical management of patient stability for increased activity during participation in an intensive rehabilitation regime.: Yes Daily analysis of laboratory values and/or  radiology reports with any subsequent need for medication adjustment of medical intervention for : Post surgical problems;Other;Cardiac problems  Liliana Brentlinger, [place] 12/26/2013, 12:42 PM

## 2013-12-26 NOTE — Progress Notes (Signed)
Nutrition Brief Note  RD met with pt at 1600 (3/3) and again today for food preferences.  Pt currently on a gluten-free diet. RD will work with pt and kitchen to provide pt with food appropriate for her diet order.  Brynda Greathouse, MS RD LDN Clinical Inpatient Dietitian Pager: [phone] Weekend/After hours pager: [phone]

## 2013-12-26 NOTE — Progress Notes (Signed)
Occupational Therapy Session Note  Patient Details  Name: Maria [name] MRN: [id number] Date of Birth: 02/09/1924  Today's Date: 12/26/2013 Time: 0930-1025 Time Calculation (min): 55 min  Short Term Goals: Week 2:  OT Short Term Goal 1 (Week 2): Patient will perform lower body bathing with steadying assist while standing OT Short Term Goal 2 (Week 2): Patient will complete lower body dressing using AE, prn, with mod assist OT Short Term Goal 3 (Week 2): Patient will complete toilet transfer with supervision OT Short Term Goal 4 (Week 2): Patient will complete toilet hygeine and clothing managmenet with min assist  Skilled Therapeutic Interventions/Progress Updates: ADL-retraining with focus on improved performance with use of AE for lower body dressing.   Patient completed upper body bathing and dressing prior to arrival of therapist, at sink, with only setup assist.   With extra time and assist with donning TEDs, patient completed lower body dressing using reacher and sock aid but remained dependent for donning right foot orthosis.  Per patient, her husband will assist with dressing right foot s/p discharge.  Patient now able to consistently stand at sink, supporting herself to pull up underwear and pants with only supervision for safety.   Plan to review performance with toileting during afternoon session.  Therapy Documentation Precautions:  Precautions Precautions: Posterior Hip;Fall Precaution Booklet Issued: Yes (comment) Precaution Comments: Pt recalls 2/3 precautions Required Braces or Orthoses: Other Brace/Splint Other Brace/Splint: Prevalon Boot for Rt. Heel  Restrictions Weight Bearing Restrictions: Yes RLE Weight Bearing: Weight bearing as tolerated  Pain: Pain Assessment Pain Assessment: 0-10 Pain Score: 2  Pain Onset: With Activity  ADL: ADL ADL Comments: see FIM  See FIM for current functional status  Therapy/Group: Individual Therapy  Second  session: Time: 1345-1430 Time Calculation (min):  45 min  Pain Assessment: No pain  Skilled Therapeutic Interventions: ADL-retraining with focus on sit<>stand, toileting (simulated), discharge planning and home safety equipment.   Patient simulated her bathroom using sink, cabinet, and w/c in place of toilet, to demonstrate how she would manage toilet hygiene and clothing management at home.   Using sink and cabinet drawer, patient demo'd ability to rise from toilet, lean against cabinet and sink, complete hygiene and use reacher to manage clothing.   Patient was educated on use of toilet safety frame to improve toilet transfer safety after realizing poor support from use of RW during sit <> stand.   Patient demonstrated improved safety awareness after review and rehearsal of method she would likely use at home.   See FIM for current functional status  Therapy/Group: Individual Therapy  [place] 12/26/2013, 10:30 AM

## 2013-12-26 NOTE — Progress Notes (Signed)
Social Work Patient ID: [name], female   DOB: 02-03-1924, 80 y.o.   MRN: [id number]  Vista Deck Deette Revak, LCSW Social Worker Signed  Patient Care Conference Service date: 12/26/2013 12:42 PM  Inpatient RehabilitationTeam Conference and Plan of Care Update Date: 12/25/2013   Time: 2:00 PM     Patient Name: Maria [name]       Medical Record Number: [id number]   Date of Birth: 1924/03/22 Sex: Female         Room/Bed: 4W02C/4W02C-01 Payor Info: Payor: MEDICARE / Plan: MEDICARE PART A AND B / Product Type: *No Product type* /   Admitting Diagnosis: FN FX   Admit Date/Time:  12/12/2013  6:05 PM Admission Comments: No comment available   Primary Diagnosis:  <principal problem not specified> Principal Problem: <principal problem not specified>    Patient Active Problem List     Diagnosis  Date Noted   .  Femoral neck fracture  12/12/2013   .  Atonic bladder  12/11/2013   .  Acute confusional state  12/11/2013   .  Hyponatremia  12/08/2013   .  Fall  12/08/2013   .  Closed right hip fracture  12/07/2013   .  UTI (lower urinary tract infection)  12/07/2013   .  Hip fracture, right  12/07/2013   .  Bradycardia  11/07/2013   .  Anticoagulation goal of INR 2.5 to 3.5  11/07/2013   .  Protein-calorie malnutrition, severe  10/04/2013   .  DVT (deep venous thrombosis)  10/03/2013   .  Pleural effusion  09/15/2013   .  Hx of CABG  09/12/2013   .  Malnutrition of mild degree  09/03/2013   .  Respiratory failure, post-operative  08/29/2013   .  Coronary atherosclerosis of native coronary artery  08/16/2013       Class: Acute   .  Mitral regurgitation  08/07/2013   .  Pulmonary hypertension, moderate to severe  08/07/2013   .  Chronic diastolic heart failure  08/07/2013       Class: Chronic   .  Essential hypertension, benign  08/02/2013   .  Nontoxic uninodular goiter  08/02/2013   .  Unspecified hereditary and idiopathic peripheral neuropathy  08/02/2013   .  Occlusion and  stenosis of carotid artery without mention of cerebral infarction  08/02/2013   .  Unspecified vitamin D deficiency  08/02/2013   .  Depressive disorder, not elsewhere classified  08/02/2013   .  Paroxysmal atrial fibrillation  08/02/2013   .  Trifascicular block  08/02/2013   .  Gastric ulcer, unspecified as acute or chronic, without mention of hemorrhage, perforation, or obstruction  08/02/2013   .  Sinoatrial node dysfunction  08/02/2013   .  Other dyspnea and respiratory abnormality  08/02/2013   .  Encounter for long-term (current) use of other medications  08/02/2013   .  NONSPECIFIC ABNORMAL FIND RAD&OTH EXAM GI TRACT  05/30/2008     Expected Discharge Date: Expected Discharge Date: 01/03/14  Team Members Present: Physician leading conference: Dr. Faith Rogue Social Worker Present: Staci Acosta, LCSW Nurse Present: Carlean Purl, RN PT Present: Karolee Stamps, Jerrye Bushy, PT OT Present: Donzetta Kohut, Loistine Chance, OT;Patricia Enders, OT SLP Present: Feliberto Gottron, SLP PPS Coordinator present : Tora Duck, RN, CRRN;Becky Henrene Dodge, PT        Current Status/Progress  Goal  Weekly Team Focus   Medical     pain issues at times.  xrays good. bladder emptying better. sodium no wnl  improve exercise tolerance and pain control  see prior   Bowel/Bladder     Occasional I/O cath due to retention; PVRs decreased overall.    Multi. bowel meds; BM QOD to Q3days.  Continent B/B.  No retention; BM QD-QOD with scheduled meds; occasional PRN  Continue PVR's; OOB for all toileting needs; Continue bowel meds.   Swallow/Nutrition/ Hydration            ADL's     Supervision and setup upper body ADL, Min A lower body ADL, Min A transfers, Min A standing balance  Supervision transfers and seated upper body ADL, Min A lower body dressing and standing balance  Endurance, functional mobility using RW, standing balance during ADL, AE training, pain managmenet   Mobility     min assist  sit <> stands and ambulation  supervision  activity tolerance, pain modulation, standing balance/reactions, precautions, safety.    Communication            Safety/Cognition/ Behavioral Observations    No unsafe behaviors; Calls appropriately. STMD's evident, bed alarm.Marland KitchenMarland KitchenMarland KitchenCues for hip precautions.  No falls, no injury this admission. Increased safety awareness.  Monitor, cue for precautions.   Pain     Schediled Zanaflex, PRN Ultram effective  Managed 2/10  Monitor; offer PRNs.   Skin     Sutures D/C'd; OTA, mild edema. No drainage. Right heel with SDTI, foam dressing.  No S/S infection; Right heel in healing process at D/C.  Prafo to LLE, float right heel; monitor incision line.    Rehab Goals Patient on target to meet rehab goals: Yes Rehab Goals Revised: None *See Care Plan and progress notes for long and short-term goals.    Barriers to Discharge:  pain, bladder      Possible Resolutions to Barriers:    double void, extra time to void.      Discharge Planning/Teaching Needs:    Pt plans to return to her one story independent living home at [facility].  She will have 24/7 assistance from her husband and intermittent from her children.  Pt is considering hiring caregivers for several hours a day vs. going to the skilled unit at [facility] for a little bit.   Family is very willing and able to complete any family education.    Team Discussion:    Pt with more hip pain then before, but x-ray has shown nothing new is wrong with her hip.  Bladder is improving, still with some bowel issues.  Pt's sodium level is better and kidneys are better.  OT and PT feel pt is progressing well and on target to meet goals by d/c date.   Revisions to Treatment Plan:    None    Continued Need for Acute Rehabilitation Level of Care: The patient requires daily medical management by a physician with specialized training in physical medicine and rehabilitation for the following conditions: Daily  direction of a multidisciplinary physical rehabilitation program to ensure safe treatment while eliciting the highest outcome that is of practical value to the patient.: Yes Daily medical management of patient stability for increased activity during participation in an intensive rehabilitation regime.: Yes Daily analysis of laboratory values and/or radiology reports with any subsequent need for medication adjustment of medical intervention for : Post surgical problems;Other;Cardiac problems  Yancy Hascall, Vista Deck 12/26/2013, 12:42 PM

## 2013-12-26 NOTE — Progress Notes (Addendum)
Occupational Therapy Weekly Progress Note  Patient Details  Name: Maria [name] MRN: [id number] Date of Birth: 06-13-1924  Today's Date: 12/26/2013  Patient has met 3 of 4 short term goals.  Patient able to reach right foot with LH sponge (goal 1)  but reports dissatisfaction with level of cleanliness provided by AE.  Patient continues to demonstrate the following deficits: Right hip pain, edema, general weakness and therefore will continue to benefit from skilled OT intervention to enhance overall performance with BADL.  Patient progressing toward long term goals..  Continue plan of care.  OT Short Term Goals Week 2:  OT Short Term Goal 1 (Week 2): Patient will perform lower body bathing with steadying assist while standing OT Short Term Goal 1 - Progress (Week 2): Partly met OT Short Term Goal 2 (Week 2): Patient will complete lower body dressing using AE, prn, with mod assist OT Short Term Goal 2 - Progress (Week 2): Met OT Short Term Goal 3 (Week 2): Patient will complete toilet transfer with supervision OT Short Term Goal 3 - Progress (Week 2): Met OT Short Term Goal 4 (Week 2): Patient will complete toilet hygeine and clothing managmenet with min assist OT Short Term Goal 4 - Progress (Week 2): Met Week 3:  OT Short Term Goal 1 (Week 3): STG=LTG   Therapy Documentation Precautions:  Precautions Precautions: Posterior Hip;Fall Precaution Booklet Issued: Yes (comment) Precaution Comments: Pt recalls 2/3 precautions Required Braces or Orthoses: Other Brace/Splint Other Brace/Splint: Prevalon Boot for Rt. Heel  Restrictions Weight Bearing Restrictions: Yes RLE Weight Bearing: Weight bearing as tolerated  Vital Signs: Therapy Vitals Temp: 97.3 F (36.3 C) Temp src: Oral Pulse Rate: 71 Resp: 17 BP: 109/65 mmHg Patient Position, if appropriate: Sitting Oxygen Therapy SpO2: 98 % O2 Device: None (Room air)  ADL: ADL ADL Comments: see  FIM   Bonner Larue 12/26/2013, 3:52 PM

## 2013-12-26 NOTE — Progress Notes (Signed)
Social Work Patient ID: Maria [name], female   DOB: [birth year], 80 y.o.   MRN: [id number]  CSW met with pt and pt's husband to update them on team conference.  Team feels pt is still on target to meet her goals by d/c date of 01-03-14.  They feel pt is progressing well and may be able to reach supervision level goals by d/c.  Pt and husband were pleased with this report.  Pt brought up to her husband that she thinks it's a good idea to either hire some private caregivers so pt can return to their private cottage at [facility] or to their skilled unit.  They will continue to discuss this and CSW will call [place] to give them some notice that pt may want to come there next week at d/c.  CSW told them that ortho examined pt and her hip x-ray and that everything looks fine.  Ortho PA shared with CSW that pt probably tore some scar tissue.  Husband seemed to agree this could cause some pain.  He was still worried about pt's thigh inflammation, so CSW asked Linna Hoff, PA to go to pt's room to address and answer Dr. Erlene Senters other questions.  CSW awaits return call from [place] at [facility].  CSW will continue to follow and assist pt/husband with d/c plan.

## 2013-12-26 NOTE — Progress Notes (Signed)
Subjective/Complaints:  Hip pain resolved yesterday. Had a good afternoon of therapy.  A 12 point review of systems has been performed and if not noted above is otherwise negative.   Objective: Vital Signs: Blood pressure 141/54, pulse 81, temperature 97.6 F (36.4 C), temperature source Oral, resp. rate 16, height 5' 1"  (1.549 m), weight 52.164 kg (115 lb), SpO2 95.00%. Dg Hip Complete Right  12/25/2013   CLINICAL DATA:  Rule out dislocation.  Recent right hip surgery  EXAM: RIGHT HIP - COMPLETE 2+ VIEW  COMPARISON:  12/07/2013  FINDINGS: Bipolar right hip hemiarthroplasty. The prosthesis is located. No periprosthetic fracture, although limited field of view in the frontal projection. No evidence of fracture or or dislocation involving the pelvic ring and left hip.  IMPRESSION: Right hip hemiarthroplasty.  No adverse features.   Electronically Signed   By: Jorje Guild M.D.   On: 12/25/2013 03:32    Recent Labs  12/25/13 0505  WBC 7.4  HGB 9.0*  HCT 27.1*  PLT 468*    Recent Labs  12/25/13 0505  NA 136*  K 4.0  CL 102  GLUCOSE 98  BUN 10  CREATININE 0.61  CALCIUM 8.6   CBG (last 3)  No results found for this basename: GLUCAP,  in the last 72 hours  Wt Readings from Last 3 Encounters:  12/19/13 52.164 kg (115 lb)  12/07/13 44.453 kg (98 lb)  12/07/13 44.453 kg (98 lb)    Physical Exam:  Constitutional: She is oriented to person, place, and time. No distress.  HENT:  Head: Normocephalic.  Eyes: EOM are normal.  Neck: Normal range of motion. Neck supple. No thyromegaly present.   Cardiovascular:  Cardiac rate controlled. Irreg No murmur. Chest incision clean/healed  Respiratory: Effort normal and breath sounds normal. No respiratory distress. No rales or wheezes  GI: Soft. Bowel sounds are normal. She exhibits no real distension.  Neurological: She is alert and oriented to person, place, and time.  Musc: right leg quite tender. Edema 1+ RLE Follows  commandsSkin:  Hip incision dry, intact,  Tender to touch along area of incision. Hip/leg aligned. Pain in right hip with basic PROM Psych: anxious  Assessment/Plan: 1. Functional deficits secondary to right femoral neck fracture which require 3+ hours per day of interdisciplinary therapy in a comprehensive inpatient rehab setting. Physiatrist is providing close team supervision and 24 hour management of active medical problems listed below. Physiatrist and rehab team continue to assess barriers to discharge/monitor patient progress toward functional and medical goals. FIM: FIM - Bathing Bathing Steps Patient Completed: Chest;Right Arm;Left Arm;Abdomen;Right upper leg;Left upper leg Bathing: 3: Mod-Patient completes 5-7 75f10 parts or 50-74%  FIM - Upper Body Dressing/Undressing Upper body dressing/undressing steps patient completed: Thread/unthread right bra strap;Thread/unthread left bra strap;Thread/unthread right sleeve of pullover shirt/dresss;Thread/unthread left sleeve of pullover shirt/dress;Put head through opening of pull over shirt/dress;Pull shirt over trunk Upper body dressing/undressing: 4: Min-Patient completed 75 plus % of tasks FIM - Lower Body Dressing/Undressing Lower body dressing/undressing steps patient completed: Thread/unthread right underwear leg;Thread/unthread left underwear leg;Pull underwear up/down;Thread/unthread right pants leg;Thread/unthread left pants leg;Pull pants up/down;Don/Doff right shoe Lower body dressing/undressing: 4: Min-Patient completed 75 plus % of tasks  FIM - Toileting Toileting steps completed by patient: Adjust clothing prior to toileting;Performs perineal hygiene;Adjust clothing after toileting Toileting Assistive Devices: Grab bar or rail for support Toileting: 1: Total-Patient completed zero steps, helper did all 3  FIM - TAir cabin crew  Transfers Assistive Devices: Grab bars Toilet Transfers: 5-To toilet/BSC: Supervision  (verbal cues/safety issues);5-From toilet/BSC: Supervision (verbal cues/safety issues)  FIM - Engineer, site Assistive Devices: Bed rails Bed/Chair Transfer: 3: Supine > Sit: Mod A (lifting assist/Pt. 50-74%/lift 2 legs;4: Bed > Chair or W/C: Min A (steadying Pt. > 75%);4: Chair or W/C > Bed: Min A (steadying Pt. > 75%)  FIM - Locomotion: Wheelchair Locomotion: Wheelchair: 1: Total Assistance/staff pushes wheelchair (Pt<25%) FIM - Locomotion: Ambulation Locomotion: Ambulation Assistive Devices: Administrator Ambulation/Gait Assistance: 5: Supervision Locomotion: Ambulation: 1: Travels less than 50 ft with supervision/safety issues  Comprehension Comprehension Mode: Auditory Comprehension: 5-Understands complex 90% of the time/Cues < 10% of the time  Expression Expression Mode: Verbal Expression: 5-Expresses complex 90% of the time/cues < 10% of the time  Social Interaction Social Interaction: 5-Interacts appropriately 90% of the time - Needs monitoring or encouragement for participation or interaction.  Problem Solving Problem Solving: 4-Solves basic 75 - 89% of the time/requires cueing 10 - 24% of the time  Memory Memory: 4-Recognizes or recalls 75 - 89% of the time/requires cueing 10 - 24% of the time  Medical Problem List and Plan:  1. Right femoral neck fracture secondary to fall. Status post hemiarthroplasty 12/09/2013 weightbearing as tolerated with posterior hip cautions   -likely spasms. Added robaxin, use heat/ ice  -will contact ortho as a follow up given acutely increased pain 2. DVT/ Anticoagulation: Xarelto. Has history of RLE DVT  -elevate right leg and compress-- 3. Pain Management: Zanaflex 2 mg 3 times a day,--will increase HS dose to 54m for HS spasms.  - Ultram 50 mg every 6 hours as   -robaxin as above needed pain. May have to back off ultram if she continues to have hallucinations or AMS 4. Neuropsych: This patient is capable of  making decisions on her own behalf.   -May be having a little sundowning at night vs med effect 5. Acute blood loss anemia. Latest hemoglobin 9.2. Followup CBC  6. SIADH. Sodium level to 133 today.  Given bp issues will relax FR to 1500 cc   - Continue low-dose Lasix  per nephrology recs  -potassium now nl 7. Diastolic congestive heart failure. No signs of fluid overload. Continue low-dose diuretic  8. CAD/CABG. Followup cardiology services as needed. No chest pain or shortness of breath   -TTH's, check orthostatic vitals  -FR relaxed 9. Atrial fibrillation. Cardiac rate control. Continue xarelto  10. Hypothyroidism. Synthroid. Latest TSH level 2.175  11. Klebsiella/Escherichia coli UTI with urinary retention.  -levaquin completed. -recheck urine culture showed coag neg staph---60k, give 3 days macrobid  - continue voiding trial with cathing prn--starting to show progress  -needs to be up to void and need to continue timed voiding attempts  -continue with bowel rx  -stay with flomax bp permitting  12. Skin breakdown right heel--   -elevate   -prevalon boot  -ordered a darco shoe with heel cut out per [facility] 13. Constipation:   -needs BM today   LOS (Days) 14 A FACE TO FACE EVALUATION WAS PERFORMED  Maria [name] T 12/26/2013 7:45 AM

## 2013-12-26 NOTE — Progress Notes (Signed)
Physical Therapy Note  Patient Details  Name: Maria [name] MRN: [id number] Date of Birth: 05-08-1924 Today's Date: 12/26/2013  1100-1150 (50 minutes) individual Pain: 7/10 RT hip/ nurse notified/ pain meds given Precautions: RT hemiarthroplasty; WBAT: posterior hip precautions Other: BP in supine 111/68 ; standing 103/62 Focus of treatment: bed mobility training; therapeutic exercise focused on AROM/strengthening RT LE; gait training Treatment: Pt in wc upon arrival; transfer stand/turn RW close SBA + vcs for brakes and assist with footrests; sit to supine (mat) X 2 using leg loop RT LE min to close SBA with vcs for sequencing; therapeutic exercise bilateral LEs x 20 - heel slides (AA on right) , ankle pumps, SAQs; supine to sit X 2 using leg loop RT LE SBA with difficulty/increased time; gait 20 feet X 1 RW SBA ; returned to room with all needs within reach; husband present.   1430-1455 (25 minutes) individual Pain: no reported pain Focus of treatment: bed mobility training Treatment: Pt in wc upon arrival; wc setup for transfer vcs for brakes + assist with footrests; stand/turn RW SBA WBAT RT LE; sit to supine using leg loop RT LE min assist + vcs for sequencing; supine to sit SBA using leg loop right + vcs to maintain knee extension on right; returned to room with all needs within reach.       Kentavius Dettore,JIM 12/26/2013, 11:40 AM

## 2013-12-27 ENCOUNTER — Inpatient Hospital Stay (HOSPITAL_COMMUNITY): Payer: Medicare Other

## 2013-12-27 ENCOUNTER — Inpatient Hospital Stay (HOSPITAL_COMMUNITY): Payer: Medicare Other | Admitting: Physical Therapy

## 2013-12-27 ENCOUNTER — Ambulatory Visit: Payer: Medicare Other | Admitting: Cardiothoracic Surgery

## 2013-12-27 NOTE — Progress Notes (Signed)
Physical Therapy Weekly Progress Note  Patient Details  Name: Maria [name] MRN: [id number] Date of Birth: 08-Jul-1924  Today's Date: 12/27/2013 Time: 0730-0830 Time Calculation (min): 60 min  Patient has met 1 of 3 short term goals.  Pt currently requires S/min-guard assist for ambulation and min assist for bed mobility. She has been limited by slow progress, decreased pain tolerance, and decreased ability to remember precautions however is continuing to make steady gains. Pt and husband will need family education soon to practice in preparation for return home.   Patient continues to demonstrate the following deficits: decreased standing balance, decreased functional mobility, decreased ability to recall precautions, generalized weakness and therefore will continue to benefit from skilled PT intervention to enhance overall performance with activity tolerance, balance, ability to compensate for deficits and knowledge of precautions.  Patient progressing toward long term goals..  Continue plan of care.  Skilled Therapeutic Interventions/Progress Updates:    Pt in a foul mood this morning, upset with a multitude of issues but willing to work with PT. Standing balance and transfers while getting pants and brief donned - supervision. Ambulation 2 x 240' with S/min-guard assist and RW, cues for proximity of RW and avoiding internal rotation (IR) of Rt LE during turns particularly to Rt. Obstacle course with RW negotiating cones and stepping over small obstacle and up/down 2" step, min assist cues for precautions and sequencing. Standing balance UE supported by bedside table hip abduction bil LEs.   Therapy Documentation Precautions:  Precautions Precautions: Posterior Hip;Fall Precaution Booklet Issued: Yes (comment) Precaution Comments: Pt recalls 2/3 precautions Required Braces or Orthoses: Other Brace/Splint Other Brace/Splint: Prevalon Boot for Rt. Heel  Restrictions Weight Bearing  Restrictions: Yes RLE Weight Bearing: Weight bearing as tolerated Pain: Pain Assessment Pain Assessment: 0-10 Pain Score: 3  Pain Type: Surgical pain Pain Location: Leg Pain Orientation: Right Pain Descriptors / Indicators: Sore Pain Onset: On-going Pain Intervention(s): Repositioned;Cold applied;Rest  See FIM for current functional status  Therapy/Group: Individual Therapy  Maria [name] 12/27/2013, 12:33 PM

## 2013-12-27 NOTE — Progress Notes (Addendum)
Pt still requires occ'l I/O cath, usually QD.  Allow to double void, OOB for all voids, attempts.  PVR at noon 169cc after void; At 1850, PVR=369cc. Report to eve RN.

## 2013-12-27 NOTE — Progress Notes (Signed)
Nursing Note: Pt up to the bathroom and voided 500 cc.A: scan for >350 and R: In and out cathed by Arlene,NT for 500 cc.wbb

## 2013-12-27 NOTE — Progress Notes (Signed)
Physical Therapy Session Note  Patient Details  Name: Maria [name] MRN: [id number] Date of Birth: December 09, 1923  Today's Date: 12/27/2013 Time: 5284-1324 Time Calculation (min): 35 min  Short Term Goals: Week 1:  PT Short Term Goal 1 (Week 1): Pt will perform sit <> stands with min assist from bed or w/c.  PT Short Term Goal 1 - Progress (Week 1): Met PT Short Term Goal 2 (Week 1): Pt will transfer bed <> w/c with moderate assistance and LRAD PT Short Term Goal 2 - Progress (Week 1): Met PT Short Term Goal 3 (Week 1): Pt will ambulate x 30' with RW and min assist.  PT Short Term Goal 3 - Progress (Week 1): Met  Skilled Therapeutic Interventions/Progress Updates:   Pt resting in recliner following OT session; pt seated with R knee IR; re-educated pt on LE positioning during sitting with attention to hip precautions and attention to knee position in sitting as indication for hip position.  Performed sit > stand from recliner with min A with tactile cues to maintain R hip ER to neutral during sit > stand.  Performed gait x 125' with RW and supervision with decreased gait velocity, decreased stance time RLE and slight R foot drag secondary to [place].  Pt reporting fatigue at elevators; returned to w/c to transport rest of the way to ortho gym.  Discussed transportation options at D/C.  Reports low [employer] and higher mini van.  Performed simulated car transfer at "van" height.  Pt able to transfer stand pivot with RW supervision and able to scoot hips back on seat with supervision but required min A and total verbal cues for safe sequence to recline trunk while elevating RLE into and out of the van to maintain hip precautions.  Returned to w/c with min A.  Recommending that pt not ride in [place] secondary to risk for breaking hip precautions secondary to low height but Lucianne Lei may be too tall to place buttocks on seat; recommending that pt perform actual car transfer with primary PT and husband prior to  D/C to determine safest car for pt to ride in.    Returned to room in w/c secondary to fatigue.  Ambulated from w/c > recliner with supervision and RW; pt requiring cues for safety when removing leg rests from w/c secondary to pt IR/ADD RLE to remove from leg rest.  Advised pt to let second person assist with lifting RLE and removing leg rest.  Also required verbal cues for safe sit > stand sequence at end of session from w/c secondary to attempting pull up on RW or sliding all the way to the edge of seat.  Pt set up in recliner with triangle wedge between LE to maintain R hip position in neutral rotation, ABD and ice on R hip.    Pt also continues to report significant LE edema and calf tenderness to touch or during ankle movement.  RN notified.     Therapy Documentation Precautions:  Precautions Precautions: Posterior Hip;Fall Precaution Booklet Issued: Yes (comment) Precaution Comments: Pt recalls 2/3 precautions Required Braces or Orthoses: Other Brace/Splint Other Brace/Splint: Prevalon Boot for Rt. Heel  Restrictions Weight Bearing Restrictions: Yes RLE Weight Bearing: Weight bearing as tolerated Pain: Pain Assessment Pain Assessment: 0-10 Pain Score: 3  Pain Type: Surgical pain Pain Location: Leg Pain Orientation: Right Pain Descriptors / Indicators: Sore Pain Onset: On-going Pain Intervention(s): Repositioned;Cold applied;Rest  See FIM for current functional status  Therapy/Group: Individual Therapy  Raylene Everts  [place] 12/27/2013, 12:16 PM

## 2013-12-27 NOTE — Progress Notes (Signed)
Subjective/Complaints:  Had a rough night. Had issues with bladder, nursing tech apparently in the evening. Didn't get settled down til about midnight. A 12 point review of systems has been performed and if not noted above is otherwise negative.   Objective: Vital Signs: Blood pressure 101/58, pulse 87, temperature 97.7 F (36.5 C), temperature source Oral, resp. rate 20, height 5' 1"  (1.549 m), weight 48.852 kg (107 lb 11.2 oz), SpO2 95.00%. No results found.  Recent Labs  12/25/13 0505  WBC 7.4  HGB 9.0*  HCT 27.1*  PLT 468*    Recent Labs  12/25/13 0505  NA 136*  K 4.0  CL 102  GLUCOSE 98  BUN 10  CREATININE 0.61  CALCIUM 8.6   CBG (last 3)  No results found for this basename: GLUCAP,  in the last 72 hours  Wt Readings from Last 3 Encounters:  12/26/13 48.852 kg (107 lb 11.2 oz)  12/07/13 44.453 kg (98 lb)  12/07/13 44.453 kg (98 lb)    Physical Exam:  Constitutional: She is oriented to person, place, and time. No distress.  HENT:  Head: Normocephalic.  Eyes: EOM are normal.  Neck: Normal range of motion. Neck supple. No thyromegaly present.   Cardiovascular:  Cardiac rate controlled. Irreg No murmur. Chest incision clean/healed  Respiratory: Effort normal and breath sounds normal. No respiratory distress. No rales or wheezes  GI: Soft. Bowel sounds are normal. She exhibits no real distension.  Neurological: She is alert and oriented to person, place, and time.  Musc: right leg quite tender. Edema 1+ RLE (improved) Follows commandsSkin:  Hip incision dry, intact,  Tender to touch along area of incision. Hip/leg aligned. Pain in right hip with basic PROM Psych: anxious  Assessment/Plan: 1. Functional deficits secondary to right femoral neck fracture which require 3+ hours per day of interdisciplinary therapy in a comprehensive inpatient rehab setting. Physiatrist is providing close team supervision and 24 hour management of active medical  problems listed below. Physiatrist and rehab team continue to assess barriers to discharge/monitor patient progress toward functional and medical goals. FIM: FIM - Bathing Bathing Steps Patient Completed: Chest;Right Arm;Left Arm;Abdomen;Front perineal area;Buttocks;Right upper leg;Left upper leg;Left lower leg (including foot) Bathing: 4: Min-Patient completes 8-9 [id number] parts or 75+ percent  FIM - Upper Body Dressing/Undressing Upper body dressing/undressing steps patient completed: Thread/unthread right bra strap;Thread/unthread left bra strap;Thread/unthread right sleeve of pullover shirt/dresss;Thread/unthread left sleeve of pullover shirt/dress;Put head through opening of pull over shirt/dress;Pull shirt over trunk Upper body dressing/undressing: 4: Min-Patient completed 75 plus % of tasks FIM - Lower Body Dressing/Undressing Lower body dressing/undressing steps patient completed: Thread/unthread right underwear leg;Thread/unthread left underwear leg;Pull underwear up/down;Thread/unthread right pants leg;Thread/unthread left pants leg;Pull pants up/down;Don/Doff left sock;Don/Doff right sock;Don/Doff left shoe Lower body dressing/undressing: 4: Min-Patient completed 75 plus % of tasks  FIM - Toileting Toileting steps completed by patient: Adjust clothing prior to toileting;Performs perineal hygiene Toileting Assistive Devices: Grab bar or rail for support Toileting: 4: Steadying assist  FIM - TRadio producerDevices: Grab bars Toilet Transfers: 5-To toilet/BSC: Supervision (verbal cues/safety issues);5-From toilet/BSC: Supervision (verbal cues/safety issues)  FIM - BEngineer, siteAssistive Devices: Bed rails Bed/Chair Transfer: 3: Supine > Sit: Mod A (lifting assist/Pt. 50-74%/lift 2 legs;4: Bed > Chair or W/C: Min A (steadying Pt. > 75%);4: Chair or W/C > Bed: Min A (steadying Pt. > 75%)  FIM - Locomotion: Wheelchair Locomotion:  Wheelchair: 1: Total  Assistance/staff pushes wheelchair (Pt<25%) FIM - Locomotion: Ambulation Locomotion: Ambulation Assistive Devices: Walker - Rolling Ambulation/Gait Assistance: 5: Supervision Locomotion: Ambulation: 1: Travels less than 50 ft with supervision/safety issues  Comprehension Comprehension Mode: Auditory Comprehension: 5-Understands complex 90% of the time/Cues < 10% of the time  Expression Expression Mode: Verbal Expression: 5-Expresses complex 90% of the time/cues < 10% of the time  Social Interaction Social Interaction: 5-Interacts appropriately 90% of the time - Needs monitoring or encouragement for participation or interaction.  Problem Solving Problem Solving: 4-Solves basic 75 - 89% of the time/requires cueing 10 - 24% of the time  Memory Memory: 5-Recognizes or recalls 90% of the time/requires cueing < 10% of the time  Medical Problem List and Plan:  1. Right femoral neck fracture secondary to fall. Status post hemiarthroplasty 12/09/2013 weightbearing as tolerated with posterior hip cautions   -likely spasms. Added robaxin which has been helpful, use heat/ ice 2. DVT/ Anticoagulation: Xarelto. Has history of RLE DVT  -elevate right leg and compress-- 3. Pain Management: Zanaflex 2 mg 3 times a day,--will increase HS dose to 42m for HS spasms.  - Ultram 50 mg every 6 hours as   -robaxin as above needed pain. May have to back off ultram if she continues to have hallucinations or AMS 4. Neuropsych: This patient is capable of making decisions on her own behalf.   -May be having a little sundowning at night vs med effect 5. Acute blood loss anemia. Latest hemoglobin 9.2. Followup CBC  6. SIADH. Sodium level to 133 today.  FR 1500 cc   - Continue low-dose Lasix  per nephrology recs  -potassium now nl 7. Diastolic congestive heart failure. No signs of fluid overload. Continue low-dose diuretic  8. CAD/CABG. Followup cardiology services as needed. No chest pain  or shortness of breath   -TTH's, check orthostatic vitals  -FR relaxed 9. Atrial fibrillation. Cardiac rate control. Continue xarelto  10. Hypothyroidism. Synthroid. Latest TSH level 2.175  11. Klebsiella/Escherichia coli UTI with urinary retention.  -levaquin completed. -recheck urine culture showed coag neg staph---60k, increase to 5 days macrobid  - continue voiding trial with cathing prn--cathed once yesterday  -needs to be up to void and need to continue timed voiding attempts  -continue with bowel rx  -stay with flomax bp permitting  12. Skin breakdown right heel--   -elevate   -prevalon boot  -ordered a darco shoe with heel cut out per [facility] 13. Constipation:   -two stools yesterday   LOS (Days) 15 A FACE TO FACE EVALUATION WAS PERFORMED  Maria [name] T 12/27/2013 7:46 AM

## 2013-12-27 NOTE — Progress Notes (Signed)
Nursing  Note: Pt voided 200 cc.Scan for 388,In and out cathed for 400cc by Arlene ,NT.[email or web address]

## 2013-12-27 NOTE — Progress Notes (Signed)
Occupational Therapy Session Note  Patient Details  Name: ZHANIA SHAHEEN MRN: [id number] Date of Birth: 1924/04/20  Today's Date: 12/27/2013 Time: 1030-1130 Time Calculation (min): 60 min  Short Term Goals: Week 3:  OT Short Term Goal 1 (Week 3): STG=LTG  Skilled Therapeutic Interventions/Progress Updates:  ADL-retraining with focus on functional mobility, endurance, and family ed (husband) on assisted transfers.   Patient's husband was educated on best position to assist with transfer, maintaining position at patient's right side to prevent posterior instability, however husband did not duplicate support as advised during this session but reverted to a more familiar position, bracing RW in front of patient.   Plan to re-educate on necessity of lateral support when feasible.   Husband left session as patient began mobility using RW but confirmed that he would assist .   Patient ambulated to ADL apartment and performed unassisted transfer to standard bed without need for leg lifter and maintaining hip precautions with only questioning cues provided.   Patient returned to her room and performed lower body dressing (changing orientation of her pants) with min assist to remove right sock and shoe.        Therapy Documentation Precautions:  Precautions Precautions: Posterior Hip;Fall Precaution Booklet Issued: Yes (comment) Precaution Comments: Pt recalls 2/3 precautions Required Braces or Orthoses: Other Brace/Splint Other Brace/Splint: Prevalon Boot for Rt. Heel  Restrictions Weight Bearing Restrictions: Yes RLE Weight Bearing: Weight bearing as tolerated  Pain: Pain Assessment Pain Assessment: 0-10 Pain Score: 3  Pain Type: Surgical pain Pain Location: Leg Pain Orientation: Right Pain Descriptors / Indicators: Sore Pain Onset: On-going Pain Intervention(s): Repositioned;Cold applied;Rest  ADL: ADL ADL Comments: see FIM  See FIM for current functional status  Therapy/Group:  Individual Therapy  Second session: Time: 1330-1400 Time Calculation (min): 30 min  Pain Assessment: No pain  Skilled Therapeutic Interventions: Therapeutic activity with focus on improved dynamic standing balance, functional mobility using RW, improved safety awareness.   Patient continues to require verbal cues to weight-shifting to left hip prior to completing sit <> stand.   Patient receptive to adaptation to RW using qty 2, 2 lbs ankle weights on lower crossbars to improve stability of RW.   Patient ambulated X 150 feet and verbalized satisfaction with adaptation to RW.    Patient returned to room and performed 2 new grip strengthening exercises to gain competence in 4 of 11 exercises provided using thera-putty.   See FIM for current functional status  Therapy/Group: Individual Therapy  [place] 12/27/2013, 12:54 PM

## 2013-12-28 ENCOUNTER — Ambulatory Visit (HOSPITAL_COMMUNITY): Payer: Medicare Other

## 2013-12-28 ENCOUNTER — Inpatient Hospital Stay (HOSPITAL_COMMUNITY): Payer: Medicare Other

## 2013-12-28 ENCOUNTER — Inpatient Hospital Stay (HOSPITAL_COMMUNITY): Payer: Medicare Other | Admitting: Rehabilitation

## 2013-12-28 ENCOUNTER — Inpatient Hospital Stay (HOSPITAL_COMMUNITY): Payer: Medicare Other | Admitting: Physical Therapy

## 2013-12-28 DIAGNOSIS — D62 Acute posthemorrhagic anemia: Secondary | ICD-10-CM

## 2013-12-28 DIAGNOSIS — I25810 Atherosclerosis of coronary artery bypass graft(s) without angina pectoris: Secondary | ICD-10-CM

## 2013-12-28 DIAGNOSIS — Z4889 Encounter for other specified surgical aftercare: Secondary | ICD-10-CM

## 2013-12-28 DIAGNOSIS — S72009A Fracture of unspecified part of neck of unspecified femur, initial encounter for closed fracture: Secondary | ICD-10-CM

## 2013-12-28 DIAGNOSIS — W010XXA Fall on same level from slipping, tripping and stumbling without subsequent striking against object, initial encounter: Secondary | ICD-10-CM

## 2013-12-28 DIAGNOSIS — I4891 Unspecified atrial fibrillation: Secondary | ICD-10-CM

## 2013-12-28 LAB — BASIC METABOLIC PANEL
BUN: 7 mg/dL (ref 6–23)
CALCIUM: 8.8 mg/dL (ref 8.4–10.5)
CO2: 24 meq/L (ref 19–32)
Chloride: 99 mEq/L (ref 96–112)
Creatinine, Ser: 0.54 mg/dL (ref 0.50–1.10)
GFR calc Af Amer: >90 mL/min (ref >90)
GFR, EST NON AFRICAN AMERICAN: 82 mL/min — AB (ref >90)
GLUCOSE: 96 mg/dL (ref 70–99)
Potassium: 4 mEq/L (ref 3.7–5.3)
Sodium: 134 mEq/L — ABNORMAL LOW (ref 137–147)

## 2013-12-28 LAB — CBC
HCT: 30.2 % — ABNORMAL LOW (ref 36.0–46.0)
HEMOGLOBIN: 9.8 g/dL — AB (ref 12.0–15.0)
MCH: 26.3 pg (ref 26.0–34.0)
MCHC: 32.5 g/dL (ref 30.0–36.0)
MCV: 81 fL (ref 78.0–100.0)
PLATELETS: 462 10*3/uL — AB (ref 150–400)
RBC: 3.73 MIL/uL — AB (ref 3.87–5.11)
RDW: 18.7 % — ABNORMAL HIGH (ref 11.5–15.5)
WBC: 5.7 10*3/uL (ref 4.0–10.5)

## 2013-12-28 MED ORDER — SODIUM CHLORIDE 1 G PO TABS
2.0000 g | ORAL_TABLET | Freq: Two times a day (BID) | ORAL | Status: DC
  Administered 2013-12-28 – 2013-12-30 (×6): 2 g via ORAL
  Filled 2013-12-28 (×9): qty 2

## 2013-12-28 NOTE — Progress Notes (Signed)
Physical Therapy Note  Patient Details  Name: Maria [name] MRN: [id number] Date of Birth: 04-06-1924 Today's Date: 12/28/2013  Time: 770-340 35 minutes  1:1 Pt c/o soreness at end of session, RN aware of need for pain meds.  Gait training with RW with R darco boot with supervision, pt anxious about breaking hip precautions, needs frequent reminders of what precautions are and how to gait while maintaining them.  Bed mobility in ADL apartment with cues for technique to maintain precautions with supine <> sit, pt able to perform with supervision.  Discussed positioning in bed to prevent IR during sleep.  Supine therex AAROM for heel slides, SAQ, hip abd/add.  Pt continues with significant weakness in R hip, requiring assist for all therex.   Lillyahna Hemberger 12/28/2013, 8:22 AM

## 2013-12-28 NOTE — Progress Notes (Signed)
Occupational Therapy Session Note  Patient Details  Name: Maria [name] MRN: [id number] Date of Birth: 08/29/1924  Today's Date: 12/28/2013 Time: 1030-1130 Time Calculation (min): 60 min  Short Term Goals: Week 3:  OT Short Term Goal 1 (Week 3): STG=LTG  Skilled Therapeutic Interventions/Progress Updates: Therapeutic activity with focus on strengthening, endurance, dynamics standing balance and functional mobility using RW.   Patient refused ADL session this am due to poor sleep previous night and assisted ADL prior to treatment time.   Patient receptive for continued grip/pinch strengthening, functional mobility and endurance training.   Patient completed remaining 3 hand grip/pinch strengthening exercises with repeated demonstrations required for improved competence with intent and performance of exercise.   Patient ambulated approx 175 feet using RW with close supervision; no evidence of fatigue or LOB noted during mobility.   During treatment, when confronted with deficits relating to toileting, patient reported her extensive history of celiac disease and urologic complications that have required intermittent self-catheterizations and adjustments to diet and bowel routine.   Patient remains confident that she will return to her baseline function with toileting s/p discharge.     Therapy Documentation Precautions:  Precautions Precautions: Posterior Hip;Fall Precaution Booklet Issued: Yes (comment) Precaution Comments: Pt recalls 2/3 precautions Required Braces or Orthoses: Other Brace/Splint Other Brace/Splint: Prevalon Boot for Rt. Heel  Restrictions Weight Bearing Restrictions: Yes RLE Weight Bearing: Weight bearing as tolerated  Pain: Pain Assessment Pain Assessment: 0-10 Pain Score: 3  Pain Type: Acute pain;Surgical pain Pain Location: Leg Pain Orientation: Right Pain Descriptors / Indicators: Aching;Sore Pain Frequency: Occasional Pain Onset: Gradual Patients Stated  Pain Goal: 2 Pain Intervention(s): Medication (See eMAR) (ultram 50  mg po)  ADL: ADL ADL Comments: see FIM  See FIM for current functional status  Therapy/Group: Individual Therapy  Second session: Time: 1345-1430 Time Calculation (min):  45 min  Pain Assessment: No pain  Skilled Therapeutic Interventions: Therapeutic exercises with focus on UE strengthening using thera-band, orange.  Patient performed 6 UE exercises using thera-band: chest press, seated row, scapular chest pulls, scapular pull downs, bicep curls, tricep extensions, with hand guidance and repeated demonstrations.  During scapular pull downs, patient reported dizzyness from lifting head upward and downward, which she states has been a chronic problem (dizzyness with head lifts).   OT advised on possible referral for vestibular eval and treatment due to fall risk.  See FIM for current functional status  Therapy/Group: Individual Therapy  [place] 12/28/2013, 12:40 PM

## 2013-12-28 NOTE — Progress Notes (Signed)
Orthopedic Tech Progress Note Patient Details:  Maria [name] 17-Dec-1923 [id number] Binder delivered to nursing secretary Ortho Devices Type of Ortho Device: Abdominal binder Ortho Device/Splint Interventions: Ordered   Asia Mellody Memos 12/28/2013, 1:36 PM

## 2013-12-28 NOTE — Plan of Care (Signed)
Problem: RH BLADDER ELIMINATION Goal: RH STG MANAGE BLADDER WITH ASSISTANCE STG Manage Bladder With min ssistance  Outcome: Progressing Still requires in and out caths volumes  prn

## 2013-12-28 NOTE — Progress Notes (Signed)
Physical Therapy Session Note  Patient Details  Name: Maria [name] MRN: [id number] Date of Birth: Apr 25, 1924  Today's Date: 12/28/2013 Time: 0930-1000 Time Calculation (min): 30 min  Short Term Goals: Week 1:  PT Short Term Goal 1 (Week 1): Pt will perform sit <> stands with min assist from bed or w/c.  PT Short Term Goal 1 - Progress (Week 1): Met PT Short Term Goal 2 (Week 1): Pt will transfer bed <> w/c with moderate assistance and LRAD PT Short Term Goal 2 - Progress (Week 1): Met PT Short Term Goal 3 (Week 1): Pt will ambulate x 30' with RW and min assist.  PT Short Term Goal 3 - Progress (Week 1): Met  Skilled Therapeutic Interventions/Progress Updates:   Pt received sitting in w/c in room, stating that she was "very tired and sore" from previous session.  Pt with many questions regarding hip precautions and translating them to functional activities.  Spend session with education and tips of how to ensure that she was not breaking hip precautions.  Discussed using trunk and legs as "L" shape and to keep in mind that if she bends too far forward that L would be smaller, therefore breaking precautions.  Provided tip for keeping LE out in front of pt initially when sitting and standing in order to ensure she did not pass 90 deg bend.  She was able to remember placing leg out in front of her but states "no one has told me that" when given cues to perform when sitting and standing.  Discussed ensuring that RLE remain in straight alignment with shoulders, hip, knees, toes and to actively correct hip IR as she states "what do I do when I use the bathroom" in regards to IR of R hip. Also provided tip of using pillow in between knees when sleeping or sitting to give a reminder for straight alignment.  Performed one rep of sit <> stand at supervision level with pt accurately remembering to extend RLE in front of her when sitting and when standing. Ended session with short distance gait with RW w/c  to recliner at supervision level.  Pt left in recliner with all needs in reach.   Therapy Documentation Precautions:  Precautions Precautions: Posterior Hip;Fall Precaution Booklet Issued: Yes (comment) Precaution Comments: Pt recalls 2/3 precautions Required Braces or Orthoses: Other Brace/Splint Other Brace/Splint: Prevalon Boot for Rt. Heel  Restrictions Weight Bearing Restrictions: Yes RLE Weight Bearing: Weight bearing as tolerated   Pain: Pt with increased pain during this session, did not want to perform walking or therex, as she had done in previous session.    See FIM for current functional status  Therapy/Group: Individual Therapy  Denice Bors 12/28/2013, 10:12 AM

## 2013-12-28 NOTE — Progress Notes (Addendum)
Subjective/Complaints:  Had hallucinations again last evening. Expressed concerns over interactions with an evening staff member as well.  A 12 point review of systems has been performed and if not noted above is otherwise negative.   Objective: Vital Signs: Blood pressure 93/52, pulse 90, temperature 97.2 F (36.2 C), temperature source Oral, resp. rate 18, height 5' 1"  (1.549 m), weight 48.852 kg (107 lb 11.2 oz), SpO2 99.00%. No results found.  Recent Labs  12/28/13 0600  WBC 5.7  HGB 9.8*  HCT 30.2*  PLT 462*    Recent Labs  12/28/13 0600  NA 134*  K 4.0  CL 99  GLUCOSE 96  BUN 7  CREATININE 0.54  CALCIUM 8.8   CBG (last 3)  No results found for this basename: GLUCAP,  in the last 72 hours  Wt Readings from Last 3 Encounters:  12/26/13 48.852 kg (107 lb 11.2 oz)  12/07/13 44.453 kg (98 lb)  12/07/13 44.453 kg (98 lb)    Physical Exam:  Constitutional: She is oriented to person, place, and time. No distress.  HENT:  Head: Normocephalic.  Eyes: EOM are normal.  Neck: Normal range of motion. Neck supple. No thyromegaly present.   Cardiovascular:  Cardiac rate controlled. Irreg No murmur. Chest incision clean/healed  Respiratory: Effort normal and breath sounds normal. No respiratory distress. No rales or wheezes  GI: Soft. Bowel sounds are normal. She exhibits no real distension.  Neurological: She is alert and oriented to person, place, and time.  Musc: right leg quite tender. Edema 1+ RLE (improved) Follows commandsSkin:  Hip incision dry, intact,  Tender to touch along area of incision. Hip/leg aligned. Pain in right hip with basic PROM Psych: anxious slightly. Generally appropriate  Assessment/Plan: 1. Functional deficits secondary to right femoral neck fracture which require 3+ hours per day of interdisciplinary therapy in a comprehensive inpatient rehab setting. Physiatrist is providing close team supervision and 24 hour management of  active medical problems listed below. Physiatrist and rehab team continue to assess barriers to discharge/monitor patient progress toward functional and medical goals. FIM: FIM - Bathing Bathing Steps Patient Completed: Chest;Right Arm;Left Arm;Abdomen;Front perineal area;Buttocks;Right upper leg;Left upper leg;Left lower leg (including foot) Bathing: 4: Min-Patient completes 8-9 107f10 parts or 75+ percent  FIM - Upper Body Dressing/Undressing Upper body dressing/undressing steps patient completed: Thread/unthread right bra strap;Thread/unthread left bra strap;Thread/unthread right sleeve of pullover shirt/dresss;Thread/unthread left sleeve of pullover shirt/dress;Put head through opening of pull over shirt/dress;Pull shirt over trunk Upper body dressing/undressing: 4: Min-Patient completed 75 plus % of tasks FIM - Lower Body Dressing/Undressing Lower body dressing/undressing steps patient completed: Thread/unthread right underwear leg;Thread/unthread left underwear leg;Pull underwear up/down;Thread/unthread right pants leg;Thread/unthread left pants leg;Pull pants up/down;Don/Doff left sock;Don/Doff right sock;Don/Doff left shoe Lower body dressing/undressing: 4: Min-Patient completed 75 plus % of tasks  FIM - Toileting Toileting steps completed by patient: Adjust clothing prior to toileting;Performs perineal hygiene Toileting Assistive Devices: Grab bar or rail for support Toileting: 4: Steadying assist  FIM - TRadio producerDevices: Grab bars Toilet Transfers: 5-To toilet/BSC: Supervision (verbal cues/safety issues);5-From toilet/BSC: Supervision (verbal cues/safety issues)  FIM - BEngineer, siteAssistive Devices: Bed rails Bed/Chair Transfer: 3: Supine > Sit: Mod A (lifting assist/Pt. 50-74%/lift 2 legs;4: Bed > Chair or W/C: Min A (steadying Pt. > 75%);4: Chair or W/C > Bed: Min A (steadying Pt. > 75%)  FIM - Locomotion:  Wheelchair Locomotion: Wheelchair: 1: Total Assistance/staff  pushes wheelchair (Pt<25%) FIM - Locomotion: Ambulation Locomotion: Ambulation Assistive Devices: Walker - Rolling Ambulation/Gait Assistance: 5: Supervision;4: Min guard Locomotion: Ambulation: 4: Travels 150 ft or more with minimal assistance (Pt.>75%)  Comprehension Comprehension Mode: Auditory Comprehension: 5-Understands complex 90% of the time/Cues < 10% of the time  Expression Expression Mode: Verbal Expression: 5-Expresses complex 90% of the time/cues < 10% of the time  Social Interaction Social Interaction: 5-Interacts appropriately 90% of the time - Needs monitoring or encouragement for participation or interaction.  Problem Solving Problem Solving: 4-Solves basic 75 - 89% of the time/requires cueing 10 - 24% of the time  Memory Memory: 5-Recognizes or recalls 90% of the time/requires cueing < 10% of the time  Medical Problem List and Plan:  1. Right femoral neck fracture secondary to fall. Status post hemiarthroplasty 12/09/2013 weightbearing as tolerated with posterior hip cautions   -robaxin, heat, ice for spasms 2. DVT/ Anticoagulation: Xarelto. Has history of RLE DVT  -elevate right leg and compress-- 3. Pain Management: Zanaflex 2 mg 3 times a day,--will increase HS dose to 48m for HS spasms.  - Ultram 50 mg every 6 hours as   -robaxin as above needed pain. May have to back off ultram if she continues to have hallucinations or AMS 4. Neuropsych: This patient is capable of making decisions on her own behalf.   -May be having a little sundowning at night likely exacerbated by meds/environment 5. Acute blood loss anemia. Latest hemoglobin up to 9.8  6. SIADH. Sodium level  134. DC FR 1500 cc   - Continue low-dose Lasix  per nephrology recs  -potassium now nl 7. Diastolic congestive heart failure. No signs of fluid overload. Continue low-dose diuretic  8. CAD/CABG. Followup cardiology services as needed.  No chest pain or shortness of breath   -still orthostatic---TEDS, abdominal binder when up. Stop flomax. Volume status looks ok  -FR relaxed 9. Atrial fibrillation. Cardiac rate control. Continue xarelto  10. Hypothyroidism. Synthroid. Latest TSH level 2.175  11. Klebsiella/Escherichia coli UTI with urinary retention.  -levaquin completed. -recheck urine culture showed coag neg staph---60k,   5 days of macrobid  - continue voiding trial with cathing prn--generally improving  -needs to be up to void and need to continue timed voiding attempts  -continue with bowel rx  -dc flomax per above  12. Skin breakdown right heel-- stable  -elevate   -prevalon boot  -darco shoe with posterior cut out for gait 13. Constipation:   -continue current meds   LOS (Days) 16 A FACE TO FACE EVALUATION WAS PERFORMED  SWARTZ,ZACHARY T 12/28/2013 7:48 AM

## 2013-12-29 ENCOUNTER — Inpatient Hospital Stay (HOSPITAL_COMMUNITY): Payer: Medicare Other | Admitting: Physical Therapy

## 2013-12-29 DIAGNOSIS — S72009A Fracture of unspecified part of neck of unspecified femur, initial encounter for closed fracture: Secondary | ICD-10-CM

## 2013-12-29 DIAGNOSIS — W010XXA Fall on same level from slipping, tripping and stumbling without subsequent striking against object, initial encounter: Secondary | ICD-10-CM

## 2013-12-29 DIAGNOSIS — D62 Acute posthemorrhagic anemia: Secondary | ICD-10-CM

## 2013-12-29 DIAGNOSIS — I25810 Atherosclerosis of coronary artery bypass graft(s) without angina pectoris: Secondary | ICD-10-CM

## 2013-12-29 DIAGNOSIS — I4891 Unspecified atrial fibrillation: Secondary | ICD-10-CM

## 2013-12-29 DIAGNOSIS — Z4889 Encounter for other specified surgical aftercare: Secondary | ICD-10-CM

## 2013-12-29 NOTE — Progress Notes (Signed)
Physical Therapy Session Note  Patient Details  Name: Maria [name] MRN: [id number] Date of Birth: Oct 18, 1924  Today's Date: 12/29/2013 Time: 7505-1833 Time Calculation (min): 55 min  Skilled Therapeutic Interventions/Progress Updates:    Husband present - session focused on family education. Educated husband on donning Rt darco boot with heel cut-out - husband concerned about heel wound PT recommend he discuss these questions with the MD. Husband provided supervision for pt ambulation x 150' to gym. Bed mobility with min assist (practiced with bed rail per pt/husband request) PT needed to prevent internal rotation and adduction of Rt hip. Car transfer van height - advised husband to slide van seat all the way back and recline seat to avoid >90 degree flexion of Rt hip. Husband verbalized understanding - also fully understood that the low Jaguar would not be appropriate due to precautions (pt upset because this is her vehicle of choice).    Husband did excellent job of helping pt remember precautions particularly with sit <> stands and during ambulation however does have a tendency to leave pt standing without assist - needs reinforcement to stay with her until she is seated.   Therapy Documentation Precautions:  Precautions Precautions: Posterior Hip;Fall Precaution Booklet Issued: Yes (comment) Precaution Comments: Pt recalls 2/3 precautions Required Braces or Orthoses: Other Brace/Splint Other Brace/Splint: Prevalon Boot for Rt. Heel  Restrictions Weight Bearing Restrictions: Yes RLE Weight Bearing: Weight bearing as tolerated Pain: Rt hip pain, Rt heel pain with mobility reports significant pain if touched otherwise tolerable. Pt was premedicated  See FIM for current functional status  Therapy/Group: Individual Therapy  Lahoma Rocker 12/29/2013, 3:10 PM

## 2013-12-29 NOTE — Progress Notes (Signed)
Subjective/Complaints:  No hallucinations reported discussed RLE pain and swelling.  A 12 point review of systems has been performed and if not noted above is otherwise negative.   Objective: Vital Signs: Blood pressure 141/70, pulse 68, temperature 97 F (36.1 C), temperature source Oral, resp. rate 16, height 5' 1"  (1.549 m), weight 48.852 kg (107 lb 11.2 oz), SpO2 98.00%. No results found.  Recent Labs  12/28/13 0600  WBC 5.7  HGB 9.8*  HCT 30.2*  PLT 462*    Recent Labs  12/28/13 0600  NA 134*  K 4.0  CL 99  GLUCOSE 96  BUN 7  CREATININE 0.54  CALCIUM 8.8   CBG (last 3)  No results found for this basename: GLUCAP,  in the last 72 hours  Wt Readings from Last 3 Encounters:  12/26/13 48.852 kg (107 lb 11.2 oz)  12/07/13 44.453 kg (98 lb)  12/07/13 44.453 kg (98 lb)    Physical Exam:  Constitutional: She is oriented to person, place, and time. No distress.  HENT:  Head: Normocephalic.  Eyes: EOM are normal.  Neck: Normal range of motion. Neck supple. No thyromegaly present.   Cardiovascular:  Cardiac rate controlled. Irreg No murmur. Chest incision clean/healed  Respiratory: Effort normal and breath sounds normal. No respiratory distress. No rales or wheezes  GI: Soft. Bowel sounds are normal. She exhibits no real distension.  Neurological: She is alert and oriented to person, place, and time.  Musc: right leg quite tender. Edema 1+ RLE (improved) Follows commandsSkin:  Hip incision dry, intact,  Tender to touch along area of incision. Hip/leg aligned. Pain in right hip with basic PROM Psych: anxious slightly. Generally appropriate  Assessment/Plan: 1. Functional deficits secondary to right femoral neck fracture which require 3+ hours per day of interdisciplinary therapy in a comprehensive inpatient rehab setting. Physiatrist is providing close team supervision and 24 hour management of active medical problems listed below. Physiatrist and  rehab team continue to assess barriers to discharge/monitor patient progress toward functional and medical goals. Reassured pt normal amt of post op swelling FIM: FIM - Bathing Bathing Steps Patient Completed: Chest;Right Arm;Left Arm;Abdomen;Front perineal area;Buttocks;Right upper leg;Left upper leg;Left lower leg (including foot) Bathing: 4: Min-Patient completes 8-9 57f10 parts or 75+ percent  FIM - Upper Body Dressing/Undressing Upper body dressing/undressing steps patient completed: Thread/unthread right bra strap;Thread/unthread left bra strap;Thread/unthread right sleeve of pullover shirt/dresss;Thread/unthread left sleeve of pullover shirt/dress;Put head through opening of pull over shirt/dress;Pull shirt over trunk Upper body dressing/undressing: 4: Min-Patient completed 75 plus % of tasks FIM - Lower Body Dressing/Undressing Lower body dressing/undressing steps patient completed: Thread/unthread right underwear leg;Thread/unthread left underwear leg;Pull underwear up/down;Thread/unthread right pants leg;Thread/unthread left pants leg;Pull pants up/down;Don/Doff left sock;Don/Doff right sock;Don/Doff left shoe Lower body dressing/undressing: 4: Min-Patient completed 75 plus % of tasks  FIM - Toileting Toileting steps completed by patient: Adjust clothing prior to toileting;Performs perineal hygiene;Adjust clothing after toileting Toileting Assistive Devices: Grab bar or rail for support Toileting: 4: Steadying assist  FIM - TRadio producerDevices: Grab bars Toilet Transfers: 5-To toilet/BSC: Supervision (verbal cues/safety issues);5-From toilet/BSC: Supervision (verbal cues/safety issues)  FIM - BEngineer, siteAssistive Devices: Bed rails Bed/Chair Transfer: 3: Supine > Sit: Mod A (lifting assist/Pt. 50-74%/lift 2 legs;3: Sit > Supine: Mod A (lifting assist/Pt. 50-74%/lift 2 legs);4: Bed > Chair or W/C: Min A (steadying Pt. >  75%);4: Chair or W/C > Bed: Min A (steadying  Pt. > 75%)  FIM - Locomotion: Wheelchair Locomotion: Wheelchair: 1: Total Assistance/staff pushes wheelchair (Pt<25%) FIM - Locomotion: Ambulation Locomotion: Ambulation Assistive Devices: Administrator Ambulation/Gait Assistance: 5: Supervision;4: Min guard Locomotion: Ambulation: 4: Travels 150 ft or more with minimal assistance (Pt.>75%)  Comprehension Comprehension Mode: Auditory;Visual Comprehension: 6-Follows complex conversation/direction: With extra time/assistive device  Expression Expression Mode: Verbal Expression: 5-Expresses complex 90% of the time/cues < 10% of the time  Social Interaction Social Interaction: 5-Interacts appropriately 90% of the time - Needs monitoring or encouragement for participation or interaction.  Problem Solving Problem Solving: 4-Solves basic 75 - 89% of the time/requires cueing 10 - 24% of the time  Memory Memory: 5-Recognizes or recalls 90% of the time/requires cueing < 10% of the time  Medical Problem List and Plan:  1. Right femoral neck fracture secondary to fall. Status post hemiarthroplasty 12/09/2013 weightbearing as tolerated with posterior hip cautions   -robaxin, heat, ice for spasms 2. DVT/ Anticoagulation: Xarelto. Has history of RLE DVT  -elevate right leg and compress-- 3. Pain Management: Zanaflex 2 mg 3 times a day,--will increase HS dose to 43m for HS spasms.  - Ultram 50 mg every 6 hours as   -robaxin as above needed pain. May have to back off ultram if she continues to have hallucinations or AMS 4. Neuropsych: This patient is capable of making decisions on her own behalf.   -May be having a little sundowning at night likely exacerbated by meds/environment 5. Acute blood loss anemia. Latest hemoglobin up to 9.8  6. SIADH. Sodium level  134. DC FR 1500 cc   - Continue low-dose Lasix  per nephrology recs  -potassium now nl 7. Diastolic congestive heart failure. No signs of  fluid overload. Continue low-dose diuretic  8. CAD/CABG. Followup cardiology services as needed. No chest pain or shortness of breath   -still orthostatic---TEDS, abdominal binder when up. Stop flomax. Volume status looks ok  -FR relaxed 9. Atrial fibrillation. Cardiac rate control. Continue xarelto  10. Hypothyroidism. Synthroid. Latest TSH level 2.175  11. Klebsiella/Escherichia coli UTI with urinary retention.  -levaquin completed. -recheck urine culture showed coag neg staph---60k,   5 days of macrobid  - continue voiding trial with cathing prn--generally improving  -needs to be up to void and need to continue timed voiding attempts  -continue with bowel rx  -dc flomax per above  12. Skin breakdown right heel-- stable  -elevate   -prevalon boot  -darco shoe with posterior cut out for gait 13. Constipation:   -continue current meds   LOS (Days) 1EdnaEVALUATION WAS PERFORMED  Iana Buzan E 12/29/2013 8:01 AM

## 2013-12-30 NOTE — Progress Notes (Signed)
Subjective/Complaints:  Husband asking about etiology of SIADH as well as whether nephro f/u is needed Pt concerned about R thigh swelling A 12 point review of systems has been performed and if not noted above is otherwise negative.   Objective: Vital Signs: Blood pressure 119/60, pulse 63, temperature 97 F (36.1 C), temperature source Oral, resp. rate 17, height 5' 1"  (1.549 m), weight 48.852 kg (107 lb 11.2 oz), SpO2 97.00%. No results found.  Recent Labs  12/28/13 0600  WBC 5.7  HGB 9.8*  HCT 30.2*  PLT 462*    Recent Labs  12/28/13 0600  NA 134*  K 4.0  CL 99  GLUCOSE 96  BUN 7  CREATININE 0.54  CALCIUM 8.8   CBG (last 3)  No results found for this basename: GLUCAP,  in the last 72 hours  Wt Readings from Last 3 Encounters:  12/26/13 48.852 kg (107 lb 11.2 oz)  12/07/13 44.453 kg (98 lb)  12/07/13 44.453 kg (98 lb)    Physical Exam:  Constitutional: She is oriented to person, place, and time. No distress.  HENT:  Head: Normocephalic.  Eyes: EOM are normal.  Neck: Normal range of motion. Neck supple. No thyromegaly present.   Cardiovascular:  Cardiac rate controlled. Irreg No murmur. Chest incision clean/healed  Respiratory: Effort normal and breath sounds normal. No respiratory distress. No rales or wheezes  GI: Soft. Bowel sounds are normal. She exhibits no real distension.  Neurological: She is alert and oriented to person, place, and time.  Musc: right leg quite tender. Edema 1+ RLE (improved) Follows commandsSkin:  Hip incision dry, intact,  Tender to touch along area of incision. Hip/leg aligned. Pain in right hip with basic PROM Psych: anxious slightly. Generally appropriate  Assessment/Plan: 1. Functional deficits secondary to right femoral neck fracture which require 3+ hours per day of interdisciplinary therapy in a comprehensive inpatient rehab setting. Physiatrist is providing close team supervision and 24 hour management of active  medical problems listed below. Physiatrist and rehab team continue to assess barriers to discharge/monitor patient progress toward functional and medical goals.  FIM: FIM - Bathing Bathing Steps Patient Completed: Chest;Right Arm;Left Arm;Abdomen;Front perineal area;Buttocks;Right upper leg;Left upper leg;Left lower leg (including foot) Bathing: 4: Min-Patient completes 8-9 49f10 parts or 75+ percent  FIM - Upper Body Dressing/Undressing Upper body dressing/undressing steps patient completed: Thread/unthread right bra strap;Thread/unthread left bra strap;Thread/unthread right sleeve of pullover shirt/dresss;Thread/unthread left sleeve of pullover shirt/dress;Put head through opening of pull over shirt/dress;Pull shirt over trunk Upper body dressing/undressing: 4: Min-Patient completed 75 plus % of tasks FIM - Lower Body Dressing/Undressing Lower body dressing/undressing steps patient completed: Thread/unthread right underwear leg;Thread/unthread left underwear leg;Pull underwear up/down;Thread/unthread right pants leg;Thread/unthread left pants leg;Pull pants up/down;Don/Doff left sock;Don/Doff right sock;Don/Doff left shoe Lower body dressing/undressing: 4: Min-Patient completed 75 plus % of tasks  FIM - Toileting Toileting steps completed by patient: Adjust clothing prior to toileting;Performs perineal hygiene;Adjust clothing after toileting Toileting Assistive Devices: Grab bar or rail for support Toileting: 4: Steadying assist  FIM - TRadio producerDevices: Grab bars Toilet Transfers: 5-To toilet/BSC: Supervision (verbal cues/safety issues);5-From toilet/BSC: Supervision (verbal cues/safety issues)  FIM - BControl and instrumentation engineerDevices: Bed rails Bed/Chair Transfer: 4: Supine > Sit: Min A (steadying Pt. > 75%/lift 1 leg);4: Sit > Supine: Min A (steadying pt. > 75%/lift 1 leg);5: Bed > Chair or W/C: Supervision (verbal cues/safety  issues);5: Chair or W/C > Bed: Supervision (verbal cues/safety issues)  FIM - Locomotion: Wheelchair Locomotion: Wheelchair: 1: Total Assistance/staff pushes wheelchair (Pt<25%) FIM - Locomotion: Ambulation Locomotion: Ambulation Assistive Devices: Administrator Ambulation/Gait Assistance: 5: Supervision;4: Min guard Locomotion: Ambulation: 4: Travels 150 ft or more with minimal assistance (Pt.>75%)  Comprehension Comprehension Mode: Auditory;Visual Comprehension: 6-Follows complex conversation/direction: With extra time/assistive device  Expression Expression Mode: Verbal Expression: 5-Expresses complex 90% of the time/cues < 10% of the time  Social Interaction Social Interaction: 5-Interacts appropriately 90% of the time - Needs monitoring or encouragement for participation or interaction.  Problem Solving Problem Solving: 4-Solves basic 75 - 89% of the time/requires cueing 10 - 24% of the time  Memory Memory: 5-Recognizes or recalls 90% of the time/requires cueing < 10% of the time  Medical Problem List and Plan:  1. Right femoral neck fracture secondary to fall. Status post hemiarthroplasty 12/09/2013 weightbearing as tolerated with posterior hip cautions   -robaxin, heat, ice for spasms 2. DVT/ Anticoagulation: Xarelto. Has history of RLE DVT which contributes to swelling, ? Na supplements as well  -elevate right leg and compress-- 3. Pain Management: Zanaflex 2 mg 3 times a day,--will increase HS dose to 77m for HS spasms.  - Ultram 50 mg every 6 hours as   -robaxin as above needed pain. May have to back off ultram if she continues to have hallucinations or AMS 4. Neuropsych: This patient is capable of making decisions on her own behalf.   -May be having a little sundowning at night likely exacerbated by meds/environment 5. Acute blood loss anemia. Latest hemoglobin up to 9.8  6. SIADH. Sodium level  134. DC FR 1500 cc   - Continue low-dose Lasix  per nephrology  recs  -potassium now nl 7. Diastolic congestive heart failure. No signs of fluid overload. Continue low-dose diuretic  8. CAD/CABG. Followup cardiology services as needed. No chest pain or shortness of breath   -still orthostatic---TEDS, abdominal binder when up. Stop flomax. Volume status looks ok  -FR relaxed 9. Atrial fibrillation. Cardiac rate control. Continue xarelto  10. Hypothyroidism. Synthroid. Latest TSH level 2.175  11. Klebsiella/Escherichia coli UTI with urinary retention.  -levaquin completed. -recheck urine culture showed coag neg staph---60k,   5 days of macrobid  - continue voiding trial with cathing prn--generally improving  -needs to be up to void and need to continue timed voiding attempts  -continue with bowel rx  -dc flomax per above  12. Skin breakdown right heel-- stable  -elevate   -prevalon boot  -darco shoe with posterior cut out for gait 13. Constipation:   -continue current meds   LOS (Days) 18 A FACE TO FACE EVALUATION WAS PERFORMED  KCharlett Blake3/05/2014 9:39 AM

## 2013-12-31 ENCOUNTER — Inpatient Hospital Stay (HOSPITAL_COMMUNITY): Payer: Medicare Other

## 2013-12-31 ENCOUNTER — Inpatient Hospital Stay (HOSPITAL_COMMUNITY): Payer: Medicare Other | Admitting: Physical Therapy

## 2013-12-31 ENCOUNTER — Ambulatory Visit (HOSPITAL_COMMUNITY): Payer: Medicare Other

## 2013-12-31 DIAGNOSIS — D62 Acute posthemorrhagic anemia: Secondary | ICD-10-CM

## 2013-12-31 DIAGNOSIS — I25810 Atherosclerosis of coronary artery bypass graft(s) without angina pectoris: Secondary | ICD-10-CM

## 2013-12-31 DIAGNOSIS — I4891 Unspecified atrial fibrillation: Secondary | ICD-10-CM

## 2013-12-31 DIAGNOSIS — S72009A Fracture of unspecified part of neck of unspecified femur, initial encounter for closed fracture: Secondary | ICD-10-CM

## 2013-12-31 DIAGNOSIS — W010XXA Fall on same level from slipping, tripping and stumbling without subsequent striking against object, initial encounter: Secondary | ICD-10-CM

## 2013-12-31 DIAGNOSIS — Z4889 Encounter for other specified surgical aftercare: Secondary | ICD-10-CM

## 2013-12-31 LAB — BASIC METABOLIC PANEL
BUN: 12 mg/dL (ref 6–23)
CHLORIDE: 100 meq/L (ref 96–112)
CO2: 25 mEq/L (ref 19–32)
Calcium: 8.9 mg/dL (ref 8.4–10.5)
Creatinine, Ser: 0.56 mg/dL (ref 0.50–1.10)
GFR, EST NON AFRICAN AMERICAN: 81 mL/min — AB (ref >90)
Glucose, Bld: 84 mg/dL (ref 70–99)
Potassium: 3.9 mEq/L (ref 3.7–5.3)
SODIUM: 134 meq/L — AB (ref 137–147)

## 2013-12-31 MED ORDER — SODIUM CHLORIDE 1 G PO TABS
1.0000 g | ORAL_TABLET | Freq: Two times a day (BID) | ORAL | Status: DC
  Administered 2013-12-31 – 2014-01-02 (×5): 1 g via ORAL
  Filled 2013-12-31 (×7): qty 1

## 2013-12-31 NOTE — Progress Notes (Signed)
Subjective/Complaints:  Right hip sore this am, had been more sore over the weekend. Feels a lump still there  A 12 point review of systems has been performed and if not noted above is otherwise negative.   Objective: Vital Signs: Blood pressure 142/67, pulse 67, temperature 97 F (36.1 C), temperature source Oral, resp. rate 16, height 5' 1"  (1.549 m), weight 48.852 kg (107 lb 11.2 oz), SpO2 96.00%. No results found. No results found for this basename: WBC, HGB, HCT, PLT,  in the last 72 hours  Recent Labs  12/31/13 0622  NA 134*  K 3.9  CL 100  GLUCOSE 84  BUN 12  CREATININE 0.56  CALCIUM 8.9   CBG (last 3)  No results found for this basename: GLUCAP,  in the last 72 hours  Wt Readings from Last 3 Encounters:  12/26/13 48.852 kg (107 lb 11.2 oz)  12/07/13 44.453 kg (98 lb)  12/07/13 44.453 kg (98 lb)    Physical Exam:  Constitutional: She is oriented to person, place, and time. No distress.  HENT:  Head: Normocephalic.  Eyes: EOM are normal.  Neck: Normal range of motion. Neck supple. No thyromegaly present.   Cardiovascular:  Cardiac rate controlled. Irreg No murmur. Chest incision clean/healed  Respiratory: Effort normal and breath sounds normal. No respiratory distress. No rales or wheezes  GI: Soft. Bowel sounds are normal. She exhibits no real distension.  Neurological: She is alert and oriented to person, place, and time.  Musc: right leg quite tender. Edema 1+ RLE (improved) Follows commandsSkin:  Hip incision dry, intact,  Tender to touch along area of incision. Hip/leg aligned. Pain in right hip with basic PROM still. Incision site pronounced-likely scar, could be slight hematoma also Psych: anxious slightly. Generally appropriate  Assessment/Plan: 1. Functional deficits secondary to right femoral neck fracture which require 3+ hours per day of interdisciplinary therapy in a comprehensive inpatient rehab setting. Physiatrist is providing  close team supervision and 24 hour management of active medical problems listed below. Physiatrist and rehab team continue to assess barriers to discharge/monitor patient progress toward functional and medical goals. FIM: FIM - Bathing Bathing Steps Patient Completed: Chest;Right Arm;Left Arm;Abdomen;Front perineal area;Buttocks;Right upper leg;Left upper leg;Left lower leg (including foot) Bathing: 4: Min-Patient completes 8-9 25f10 parts or 75+ percent  FIM - Upper Body Dressing/Undressing Upper body dressing/undressing steps patient completed: Thread/unthread right bra strap;Thread/unthread left bra strap;Thread/unthread right sleeve of pullover shirt/dresss;Thread/unthread left sleeve of pullover shirt/dress;Put head through opening of pull over shirt/dress;Pull shirt over trunk Upper body dressing/undressing: 4: Min-Patient completed 75 plus % of tasks FIM - Lower Body Dressing/Undressing Lower body dressing/undressing steps patient completed: Thread/unthread right underwear leg;Thread/unthread left underwear leg;Pull underwear up/down;Thread/unthread right pants leg;Thread/unthread left pants leg;Pull pants up/down;Don/Doff left sock;Don/Doff right sock;Don/Doff left shoe Lower body dressing/undressing: 4: Min-Patient completed 75 plus % of tasks  FIM - Toileting Toileting steps completed by patient: Adjust clothing prior to toileting;Performs perineal hygiene;Adjust clothing after toileting Toileting Assistive Devices: Grab bar or rail for support Toileting: 4: Steadying assist  FIM - TRadio producerDevices: Grab bars Toilet Transfers: 5-To toilet/BSC: Supervision (verbal cues/safety issues);5-From toilet/BSC: Supervision (verbal cues/safety issues)  FIM - BControl and instrumentation engineerDevices: Bed rails Bed/Chair Transfer: 4: Supine > Sit: Min A (steadying Pt. > 75%/lift 1 leg);4: Sit > Supine: Min A (steadying pt. > 75%/lift 1 leg);5:  Bed > Chair or W/C: Supervision (verbal cues/safety issues);5: Chair  or W/C > Bed: Supervision (verbal cues/safety issues)  FIM - Locomotion: Wheelchair Locomotion: Wheelchair: 1: Total Assistance/staff pushes wheelchair (Pt<25%) FIM - Locomotion: Ambulation Locomotion: Ambulation Assistive Devices: Administrator Ambulation/Gait Assistance: 5: Supervision;4: Min guard Locomotion: Ambulation: 4: Travels 150 ft or more with minimal assistance (Pt.>75%)  Comprehension Comprehension Mode: Auditory Comprehension: 5-Understands complex 90% of the time/Cues < 10% of the time  Expression Expression Mode: Verbal Expression: 6-Expresses complex ideas: With extra time/assistive device  Social Interaction Social Interaction: 5-Interacts appropriately 90% of the time - Needs monitoring or encouragement for participation or interaction.  Problem Solving Problem Solving: 5-Solves basic problems: With no assist  Memory Memory: 5-Recognizes or recalls 90% of the time/requires cueing < 10% of the time  Medical Problem List and Plan:  1. Right femoral neck fracture secondary to fall. Status post hemiarthroplasty 12/09/2013 weightbearing as tolerated with posterior hip cautions   -robaxin, heat, ice for spasms 2. DVT/ Anticoagulation: Xarelto. Has history of RLE DVT  -elevate right leg and compress-- needs TES on every day 3. Pain Management: Zanaflex 2 mg 3 times a day,--will increase HS dose to 38m for HS spasms.  - Ultram 50 mg every 6 hours prn  -may have small hematoma---heat as needed to area  -robaxin as above  needed pain. May have to back off ultram if she continues to have hallucinations or AMS 4. Neuropsych: This patient is capable of making decisions on her own behalf.   -occasional hallucinations as above 5. Acute blood loss anemia. Latest hemoglobin up to 9.8  6. SIADH. Sodium level remains at 134. DC'ed FR 1500 cc. Wean salt tabs this week   - Continue low-dose Lasix  per  nephrology recs  -potassium now nl 7. Diastolic congestive heart failure. No signs of fluid overload. Continue low-dose diuretic  8. CAD/CABG. Followup cardiology services as needed. No chest pain or shortness of breath   -still orthostatic---TEDS, abdominal binder when up. Stopped flomax. Volume status looks ok  -FR relaxed 9. Atrial fibrillation. Cardiac rate control. Continue xarelto  10. Hypothyroidism. Synthroid. Latest TSH level 2.175  11. Klebsiella/Escherichia coli UTI with urinary retention.  -levaquin completed. -recheck urine culture showed coag neg staph---60k,   5 days of macrobid complete  -cath prn  -needs to be up to void and need to continue timed voiding attempts  -continue with bowel rx  -dc flomax per above  12. Skin breakdown right heel-- stable  -elevate   -prevalon boot  -darco shoe with posterior cut out for gait 13. Constipation:   -continue current meds   LOS (Days) 19 A FACE TO FACE EVALUATION WAS PERFORMED  SWARTZ,ZACHARY T 12/31/2013 7:54 AM

## 2013-12-31 NOTE — Progress Notes (Addendum)
Occupational Therapy Session Note  Patient Details  Name: Maria [name] MRN: [id number] Date of Birth: 1924-01-19  Today's Date: 12/31/2013 Time: 0830-0930 Time Calculation (min): 60 min  Short Term Goals: Week 3:  OT Short Term Goal 1 (Week 3): STG=LTG  Skilled Therapeutic Interventions/Progress Updates: ADL-retraining with focus on lower body dressing, sit <> stand, dynamic standing balance, functional mobility using RW, endurance, and discharge planning.   Patient received in her w/c awaiting RN tech assist with change of brief.   RN tech assisted pt as needed.   Patient denied need for bathing as she had completed task prior to OT.   Patient completed dressing at sink side, sitting and standing to pull up pants with min verbal cues to maintain posterior hip precaution and standby assist while standing to pull up her pants.   Patient demo's competence with use of reacher to don pants but requires setup assist to don stocking and orthotic on right foot.   Patient continues to report her perceived need for 24/7 supervision for safety despite having demonstrated both intellectual and anticipatory awareness of her mild deficits, specifically with lower body bathing and dressing of right foot and with chronic bowel/bladder complications.   Following dressing, patient completed functional mobility using RW.   Patient ambulates greater than 150' using weighted RW without evidence of LOB or endurance deficit, managing transfers from/to wheelchair with only standby assist.    Therapy Documentation Precautions:  Precautions Precautions: Posterior Hip;Fall Precaution Booklet Issued: Yes (comment) Precaution Comments: Pt recalls 2/3 precautions Required Braces or Orthoses: Other Brace/Splint Other Brace/Splint: Prevalon Boot for Rt. Heel  Restrictions Weight Bearing Restrictions: Yes RLE Weight Bearing: Weight bearing as tolerated  Vital Signs: Therapy Vitals Temp: 97 F (36.1 C) Temp src:  Oral Pulse Rate: 67 Resp: 16 BP: 142/67 mmHg Patient Position, if appropriate: Lying Oxygen Therapy SpO2: 96 % O2 Device: None (Room air)  Pain: Pain Assessment Pain Assessment: No/denies pain Pain Score: 4  Pain Type: Acute pain Pain Location: Hip Pain Orientation: Right Pain Descriptors / Indicators: Aching Pain Onset: With Activity Patients Stated Pain Goal: 2 Pain Intervention(s): Rest;Emotional support  ADL: ADL ADL Comments: see FIM   See FIM for current functional status  Therapy/Group: Individual Therapy  Second session: Time: 2992-4268 Time Calculation (min):  45 min  Pain Assessment: No pain  Skilled Therapeutic Interventions: ADL-retraining with focus on family ed (husband and daughter) on patient's performance with functional mobility, safety awareness, transfers (to/from bed and car), and bed mobility using pillow placed between patient's knees.   Patient received seated in a 16 X 16 w/c with family members present in her room.  Patient completed functional mobility from room to ADL apartment using weighted RW with supervision (vc) to maintain alignment of right foot while ambulating.   Patient completed transfer to bed 2 times, with and without transfer handle mounted on bed.   Although able to complete transfer w/o transfer handle, patient expressed preference for device although contradicting her earlier expressed desire to minimize equipment d/t perception of added "clutter."   Patient then ambulated to ortho gym and completed unassisted transfer in/out of car simulator with height set to 26."   Throughout session, OT advised pt's daughter and husband on patient's intellectual and anticipatory awareness of her limitations and need for intermittent supervision while ambulating and performing transfers.    See FIM for current functional status  Therapy/Group: Individual Therapy  Adriannah Steinkamp 12/31/2013, 9:32 AM

## 2013-12-31 NOTE — Progress Notes (Signed)
Physical Therapy Note  Patient Details  Name: Maria [name] MRN: [id number] Date of Birth: 08/15/1924 Today's Date: 12/31/2013  1000-1055 (55 minutes) individual Pain: no reported pain in sitting ; reports "tightness" RT hip in supine/ premedicated Focus of treatment: wc mobility for activity tolerance ; gait training ; bed mobility training using leg loop Rt LE; therapeutic exercise focused on strengthening/AROM RT hip Treatment: Pt up in wc upon arrival ; pt states she wishes to try 16 " wc for home; wc mobility 120 feet SBA with increased time; gait 75 feet RW SBA WBAT RT LE + DARCO shoe RT foot ; transfers RW SBA wc >< mat; sit to supine (mat) SBA using leg loop RT LE (+ vcs to prevent hip IR); Supine to sit with leg loop SBA (with vcs to prevent hip IR); therapeutic exercise - RT hip flexion/extension using leg loop min to close SBA + vcs to attain full extension in supine ; returned to room with all needs within reach. (pt switched to 16" X 16" wc for trial).    1530-1600 (30 minutes) individual Pain: no reported pain Focus of treatment: Wc mobility training/ activity tolerance; bed mobility training Treatment: wc mobility - SBA 120 feet X 2  (room >< apartment); transfers stand/turn RW SBA ; sit >< supine standard bed with bedrail using leg loop to assist RT LE SBA X 2 with tactile cues to prevent RT hip IR ; returned to room with all needs within reach; husband present. (pt decided to get 18 " X 16 " wc).   Maston Wight,JIM 12/31/2013, 10:52 AM

## 2014-01-01 ENCOUNTER — Inpatient Hospital Stay (HOSPITAL_COMMUNITY): Payer: Medicare Other | Admitting: Rehabilitation

## 2014-01-01 ENCOUNTER — Inpatient Hospital Stay (HOSPITAL_COMMUNITY): Payer: Medicare Other | Admitting: Physical Therapy

## 2014-01-01 ENCOUNTER — Inpatient Hospital Stay (HOSPITAL_COMMUNITY): Payer: Medicare Other

## 2014-01-01 DIAGNOSIS — S72009A Fracture of unspecified part of neck of unspecified femur, initial encounter for closed fracture: Secondary | ICD-10-CM

## 2014-01-01 DIAGNOSIS — D62 Acute posthemorrhagic anemia: Secondary | ICD-10-CM

## 2014-01-01 DIAGNOSIS — I25810 Atherosclerosis of coronary artery bypass graft(s) without angina pectoris: Secondary | ICD-10-CM

## 2014-01-01 DIAGNOSIS — W010XXA Fall on same level from slipping, tripping and stumbling without subsequent striking against object, initial encounter: Secondary | ICD-10-CM

## 2014-01-01 DIAGNOSIS — Z4889 Encounter for other specified surgical aftercare: Secondary | ICD-10-CM

## 2014-01-01 DIAGNOSIS — I4891 Unspecified atrial fibrillation: Secondary | ICD-10-CM

## 2014-01-01 MED ORDER — LIDOCAINE 5 % EX PTCH
2.0000 | MEDICATED_PATCH | CUTANEOUS | Status: DC
Start: 2014-01-01 — End: 2014-01-03
  Administered 2014-01-01 – 2014-01-03 (×3): 2 via TRANSDERMAL
  Filled 2014-01-01 (×5): qty 2

## 2014-01-01 NOTE — Progress Notes (Signed)
Physical Therapy Session Note  Patient Details  Name: Maria [name] MRN: [id number] Date of Birth: 11-Jan-1924  Today's Date: 01/01/2014 Time: 5537-4827 Time Calculation (min): 28 min  Short Term Goals: Week 2:  PT Short Term Goal 1 (Week 2): Pt will transfer sit <> stand with supervision PT Short Term Goal 1 - Progress (Week 2): Met PT Short Term Goal 2 (Week 2): Pt will ambulate 68' with supervision and RW PT Short Term Goal 2 - Progress (Week 2): Not met (continues to need min-guard assist ) PT Short Term Goal 3 (Week 2): Pt will verbalize 3/3 posterior hip precautions and require <3 verbal cues to maintain precautions during 60 min therapy session. PT Short Term Goal 3 - Progress (Week 2): Not met  Skilled Therapeutic Interventions/Progress Updates:   Pt received sitting in recliner in room, agreeable to therapy.  Focus of session was gait training with RW in controlled and carpeted environment >150' at supervision level.  Also performed gait while negotiating around and over obstacles (ambulating forwards and sideways) to better simulate home environment (tight spaces).  Performed all at supervision level with min cues for decreased IR when turning towards the R.  Ended session with seated therex LAQ's x 10 reps RLE, marching (while ensuring THP) x 10 reps, and standing hip abd  X 10 reps RLE.  Pt ambulated back to room and requested to use restroom.  Was left on toilet and notified nurse tech and pt aware to pull call bell when ready to get up.    Therapy Documentation Precautions:  Precautions Precautions: Posterior Hip;Fall Precaution Booklet Issued: Yes (comment) Precaution Comments: Pt recalls 2/3 precautions Required Braces or Orthoses: Other Brace/Splint Other Brace/Splint: Prevalon Boot for Rt. Heel  Restrictions Weight Bearing Restrictions: Yes RLE Weight Bearing: Weight bearing as tolerated   Vital Signs: Therapy Vitals Temp: 97.7 F (36.5 C) Temp src: Oral Pulse  Rate: 72 Resp: 18 BP: 130/52 mmHg Patient Position, if appropriate: Sitting Oxygen Therapy SpO2: 97 % O2 Device: None (Room air) Pain: Pt with moderate c/o pain during WB, did not rate   Locomotion : Ambulation Ambulation/Gait Assistance: 5: Supervision   See FIM for current functional status  Therapy/Group: Individual Therapy  Denice Bors 01/01/2014, 4:25 PM

## 2014-01-01 NOTE — Progress Notes (Signed)
Physical Therapy Note  Patient Details  Name: Maria [name] MRN: [id number] Date of Birth: 1924-04-05 Today's Date: 01/01/2014  2076-1915 (55 minutes) individual Pain: 2/10 in sitting increasing to 8/10 in standing RT LE/ nurse notified/pain meds given Focus of treatment: therapeutic exercise focused on RT LE strengthening/AROM; bed mobility observing posterior THA precautions; gait training/ activity tolerance WBAT RT LE Treatment: Pt up in wc upon arrival; gait to/from room to gym 120 + feet RW SBA ; sit >< supine (mat) using leg loop RT LE with mod tactile cues to prevent increased RT hip IR SBA; X 20 bilateral heel slides using leg loop RT ; sit to stand from mat focusing on decreased use of UEs and eccentric control stand to sit / timing; standing hip flexion using RW support X 10 ; returned to room with all needs within reach.   5027-1423 (40 minutes) individual Pain: 5/10 RT hip in weightbearing/ premedicated Focus of treatment: Therapeutic exercise focused on bilateral quad (eccentric) strengthening to improve sit to stand ; gait training/ activity tolerance Treatment: gait to/from room to gym 120 feet RW SBA WBAT RT LE; sit to stand X 5 using bilateral UEs SBA; stand to sit without UE assist X 5 min assist secondary to decreased eccentric quad control; static standing eyes closed without UE support min assist secondary to posterior loss of balance; recommended night light at home.    Karandeep Resende,JIM 01/01/2014, 9:56 AM

## 2014-01-01 NOTE — Patient Care Conference (Addendum)
Inpatient RehabilitationTeam Conference and Plan of Care Update Date: 01/01/2014   Time: 2:00 PM    Patient Name: Maria [name]      Medical Record Number: [id number]  Date of Birth: 1924/06/17 Sex: Female         Room/Bed: 4W02C/4W02C-01 Payor Info: Payor: MEDICARE / Plan: MEDICARE PART A AND B / Product Type: *No Product type* /    Admitting Diagnosis: FN FX  Admit Date/Time:  12/12/2013  6:05 PM Admission Comments: No comment available   Primary Diagnosis:  <principal problem not specified> Principal Problem: <principal problem not specified>  Patient Active Problem List   Diagnosis Date Noted  . Femoral neck fracture 12/12/2013  . Atonic bladder 12/11/2013  . Acute confusional state 12/11/2013  . Hyponatremia 12/08/2013  . Fall 12/08/2013  . Closed right hip fracture 12/07/2013  . UTI (lower urinary tract infection) 12/07/2013  . Hip fracture, right 12/07/2013  . Bradycardia 11/07/2013  . Anticoagulation goal of INR 2.5 to 3.5 11/07/2013  . Protein-calorie malnutrition, severe 10/04/2013  . DVT (deep venous thrombosis) 10/03/2013  . Pleural effusion 09/15/2013  . Hx of CABG 09/12/2013  . Malnutrition of mild degree 09/03/2013  . Respiratory failure, post-operative 08/29/2013  . Coronary atherosclerosis of native coronary artery 08/16/2013    Class: Acute  . Mitral regurgitation 08/07/2013  . Pulmonary hypertension, moderate to severe 08/07/2013  . Chronic diastolic heart failure 08/07/2013    Class: Chronic  . Essential hypertension, benign 08/02/2013  . Nontoxic uninodular goiter 08/02/2013  . Unspecified hereditary and idiopathic peripheral neuropathy 08/02/2013  . Occlusion and stenosis of carotid artery without mention of cerebral infarction 08/02/2013  . Unspecified vitamin D deficiency 08/02/2013  . Depressive disorder, not elsewhere classified 08/02/2013  . Paroxysmal atrial fibrillation 08/02/2013  . Trifascicular block 08/02/2013  . Gastric ulcer,  unspecified as acute or chronic, without mention of hemorrhage, perforation, or obstruction 08/02/2013  . Sinoatrial node dysfunction 08/02/2013  . Other dyspnea and respiratory abnormality 08/02/2013  . Encounter for long-term (current) use of other medications 08/02/2013  . NONSPECIFIC ABNORMAL FIND RAD&OTH EXAM GI TRACT 05/30/2008    Expected Discharge Date: Expected Discharge Date: 01/03/14  Team Members Present: Physician leading conference: Dr. Faith [name] Social Worker Present: Maria Acosta, LCSW Nurse Present: Maria Bode, RN PT Present: Maria [name], PT OT Present: Maria [name], OT;Maria [name], Maria [name], OT PPS Coordinator present : Maria Duck, RN, CRRN     Current Status/Progress Goal Weekly Team Focus  Medical   right leg pain. improved bladder and bowel function. sodium stable  see prior,   finalize medical issues for discharge   Bowel/Bladder   cont of bladder, PVR's below 250cc for the last 48hrs, no I&O cath required; cont of bowel on bowel meds.  cont of bladder with no retention; cont of bowel with meds  continue PVR's, monitor for constipation, OOB for toileting   Swallow/Nutrition/ Hydration             ADL's   Supervision transfers, standing balance and seated upper body ADL, Min A lower body bathing and dressing (just right foot)  Supervision transfers and seated upper body ADL, Min A lower body dressing and standing balance  AE training, pain management, functional mobility, family training, dynamic standing balance, adherence to hip precautions   Mobility   supervision  supervision  family education, precautions, and safe D/C home   Communication             Safety/Cognition/ Behavioral Observations  Pain   scheduled xanaflex; PRN tramadol and robaxin for mod-severe pain, tylenol for mild pain.  pain less than 3/10  assess pain qshift and prn, medicate as needed   Skin   incision healed, OTA; R heel DTI with allevyn, prafo  boot at HS, specialty shoes during therapy  incision heals with no infection, no further skin breakdown  monitor incision and heel, relieve pressure as needed    Rehab Goals Patient on target to meet rehab goals: Yes Rehab Goals Revised: None *See Care Plan and progress notes for long and short-term goals.  Barriers to Discharge: right leg pain    Possible Resolutions to Barriers:  pain control    Discharge Planning/Teaching Needs:  Pt is feeling more comfortable and confident with going to her home with some assistance from her husband and intermittent supervision from him and her children.  Pt feels she is "smart enough" to know when she needs help and will hire caregivers, if needed and can always go to the skilled unit at [facility] if she gets into trouble at home.  Husband has already begun family education.   Team Discussion:  MD feels pt has less hip pain and is doing better overall.  She still has spot on her heel, but this is stable.  Per OT, pt will need min assist with lower body tasks, but otherwise will be supervision.  Both PT and OT feel comfortable with pt having intermittent supervision from husband/family.  Revisions to Treatment Plan:  None   Continued Need for Acute Rehabilitation Level of Care: The patient requires daily medical management by a physician with specialized training in physical medicine and rehabilitation for the following conditions: Daily direction of a multidisciplinary physical rehabilitation program to ensure safe treatment while eliciting the highest outcome that is of practical value to the patient.: Yes Daily analysis of laboratory values and/or radiology reports with any subsequent need for medication adjustment of medical intervention for : Post surgical problems;Cardiac problems  Maria [name], [place] 01/03/2014, 9:06 AM

## 2014-01-01 NOTE — Progress Notes (Signed)
Subjective/Complaints:  Right hip remains sore/sensitive. Entire leg tender to an extent. Did fairly well with therapies yesterday.  A 12 point review of systems has been performed and if not noted above is otherwise negative.   Objective: Vital Signs: Blood pressure 170/79, pulse 80, temperature 97.4 F (36.3 C), temperature source Oral, resp. rate 18, height 5' 1"  (1.549 m), weight 48.852 kg (107 lb 11.2 oz), SpO2 97.00%. No results found. No results found for this basename: WBC, HGB, HCT, PLT,  in the last 72 hours  Recent Labs  12/31/13 0622  NA 134*  K 3.9  CL 100  GLUCOSE 84  BUN 12  CREATININE 0.56  CALCIUM 8.9   CBG (last 3)  No results found for this basename: GLUCAP,  in the last 72 hours  Wt Readings from Last 3 Encounters:  12/26/13 48.852 kg (107 lb 11.2 oz)  12/07/13 44.453 kg (98 lb)  12/07/13 44.453 kg (98 lb)    Physical Exam:  Constitutional: She is oriented to person, place, and time. No distress.  HENT:  Head: Normocephalic.  Eyes: EOM are normal.  Neck: Normal range of motion. Neck supple. No thyromegaly present.   Cardiovascular:  Cardiac rate controlled. Irreg No murmur. Chest incision clean/healed  Respiratory: Effort normal and breath sounds normal. No respiratory distress. No rales or wheezes  GI: Soft. Bowel sounds are normal. She exhibits no real distension.  Neurological: She is alert and oriented to person, place, and time.  Musc: right leg quite tender. Edema 1+ RLE (improved) Follows commandsSkin:  Hip incision dry, intact,  Tender to touch along area of incision. Hip/leg aligned. Pain in right hip with basic PROM still. Incision site pronounced-likely scar, could be slight hematoma also Psych: anxious slightly. Generally appropriate  Assessment/Plan: 1. Functional deficits secondary to right femoral neck fracture which require 3+ hours per day of interdisciplinary therapy in a comprehensive inpatient rehab  setting. Physiatrist is providing close team supervision and 24 hour management of active medical problems listed below. Physiatrist and rehab team continue to assess barriers to discharge/monitor patient progress toward functional and medical goals. FIM: FIM - Bathing Bathing Steps Patient Completed: Chest;Right Arm;Left Arm;Abdomen;Right upper leg;Front perineal area;Left upper leg Bathing: 0: Activity did not occur  FIM - Upper Body Dressing/Undressing Upper body dressing/undressing steps patient completed: Thread/unthread right sleeve of pullover shirt/dresss;Thread/unthread left sleeve of pullover shirt/dress;Put head through opening of pull over shirt/dress;Thread/unthread right bra strap;Thread/unthread left bra strap;Hook/unhook bra Upper body dressing/undressing: 6: More than reasonable amount of time FIM - Lower Body Dressing/Undressing Lower body dressing/undressing steps patient completed: Thread/unthread right underwear leg;Thread/unthread left underwear leg;Pull underwear up/down;Thread/unthread right pants leg;Thread/unthread left pants leg;Pull pants up/down;Don/Doff left sock;Don/Doff left shoe Lower body dressing/undressing: 4: Min-Patient completed 75 plus % of tasks  FIM - Toileting Toileting steps completed by patient: Adjust clothing prior to toileting;Performs perineal hygiene;Adjust clothing after toileting Toileting Assistive Devices: Grab bar or rail for support Toileting: 5: Set-up assist to: Obtain supplies  FIM - Radio producer Devices: Grab bars Toilet Transfers: 5-To toilet/BSC: Supervision (verbal cues/safety issues);5-From toilet/BSC: Supervision (verbal cues/safety issues)  FIM - Control and instrumentation engineer Devices: Bed rails Bed/Chair Transfer: 4: Supine > Sit: Min A (steadying Pt. > 75%/lift 1 leg);4: Sit > Supine: Min A (steadying pt. > 75%/lift 1 leg);5: Bed > Chair or W/C: Supervision (verbal cues/safety  issues);5: Chair or W/C > Bed: Supervision (verbal cues/safety issues)  FIM - Locomotion:  Wheelchair Locomotion: Wheelchair: 1: Total Assistance/staff pushes wheelchair (Pt<25%) FIM - Locomotion: Ambulation Locomotion: Ambulation Assistive Devices: Administrator Ambulation/Gait Assistance: 5: Supervision;4: Min guard Locomotion: Ambulation: 4: Travels 150 ft or more with minimal assistance (Pt.>75%)  Comprehension Comprehension Mode: Auditory Comprehension: 5-Understands complex 90% of the time/Cues < 10% of the time  Expression Expression Mode: Verbal Expression: 6-Expresses complex ideas: With extra time/assistive device  Social Interaction Social Interaction: 5-Interacts appropriately 90% of the time - Needs monitoring or encouragement for participation or interaction.  Problem Solving Problem Solving: 5-Solves complex 90% of the time/cues < 10% of the time  Memory Memory: 5-Requires cues to use assistive device  Medical Problem List and Plan:  1. Right femoral neck fracture secondary to fall. Status post hemiarthroplasty 12/09/2013 weightbearing as tolerated with posterior hip cautions   -robaxin, heat, ice for spasms 2. DVT/ Anticoagulation: Xarelto. Has history of RLE DVT  -elevate right leg and compress-- needs TES on every day 3. Pain Management: Zanaflex 2 mg 3 times a day,--will increase HS dose to 82m for HS spasms.  - Ultram 50 mg every 6 hours prn  -may have small hematoma---heat as needed to area  -robaxin as above   -try lidoderm patches around hip area needed pain. May have to back off ultram if she continues to have hallucinations or AMS 4. Neuropsych: This patient is capable of making decisions on her own behalf.   -occasional hallucinations as above 5. Acute blood loss anemia. Latest hemoglobin up to 9.8  6. SIADH. Sodium level remains at 134. DC'ed FR 1500 cc. Wean salt tabs to off  - Continue low-dose Lasix  per nephrology recs  -potassium now  nl  -recheck tomorrow 7. Diastolic congestive heart failure. No signs of fluid overload. Continue low-dose diuretic  8. CAD/CABG. Followup cardiology services as needed. No chest pain or shortness of breath   -still orthostatic---TEDS, abdominal binder when up. Stopped flomax. Volume status looks ok  -FR relaxed 9. Atrial fibrillation. Cardiac rate control. Continue xarelto  10. Hypothyroidism. Synthroid. Latest TSH level 2.175  11. Klebsiella/Escherichia coli UTI with urinary retention.  - coag neg staph---60k,   5 days of macrobid complete  -cath prn  -needs to be up to void and need to continue timed voiding attempts  -continue with bowel rx  -dc flomax per above  12. Skin breakdown right heel-- stable  -elevate   -prevalon boot  -darco shoe with posterior cut out for gait 13. Constipation:   -continue current meds   LOS (Days) 20 A FACE TO FACE EVALUATION WAS PERFORMED  Maria [name] T 01/01/2014 7:55 AM

## 2014-01-01 NOTE — Progress Notes (Signed)
Occupational Therapy Session Note  Patient Details  Name: Maria [name] MRN: [id number] Date of Birth: May 26, 1924  Today's Date: 01/01/2014 Time: 1030-1127 Time Calculation (min): 57 min  Short Term Goals: Week 3:  OT Short Term Goal 1 (Week 3): STG=LTG  Skilled Therapeutic Interventions/Progress Updates:  ADL-retraining with focus on improved attention and memory relating to adherence to hip precautions, effective use of AE (tub bench, hand shower, LH sponge), standing balance, and pain management.   Patient completed assigned task: to bathe in shower and dress, sitting and standing, with supervision required to adhere to hip precautions.  Patient continues to demo impaired memory and required min vc to not flex hip > 90 degrees (2 times) or cross her right leg over her left (1 time) during session.   Patient has acquired sufficient skills with use of reacher but requires min assist to don stocking and shoe on right foot.  No evidence of LOB noted during this session however patient required min verbal cues to problem-solve when challenged with dressing in bathroom which was her choice d/t intolerance to cooler room temp.     Therapy Documentation Precautions:  Precautions Precautions: Posterior Hip;Fall Precaution Booklet Issued: Yes (comment) Precaution Comments: Pt recalls 2/3 precautions Required Braces or Orthoses: Other Brace/Splint Other Brace/Splint: Prevalon Boot for Rt. Heel  Restrictions Weight Bearing Restrictions: Yes RLE Weight Bearing: Weight bearing as tolerated  Pain: Pain Assessment Pain Assessment: 0-10 Pain Score: 3  Pain Location: Hip Pain Descriptors / Indicators: Aching Patients Stated Pain Goal: 2 Pain Intervention(s): Medication (See eMAR)  ADL: ADL ADL Comments: see FIM  See FIM for current functional status  Therapy/Group: Individual Therapy  [place] 01/01/2014, 11:28 AM

## 2014-01-02 ENCOUNTER — Inpatient Hospital Stay (HOSPITAL_COMMUNITY): Payer: Medicare Other | Admitting: Physical Therapy

## 2014-01-02 ENCOUNTER — Ambulatory Visit (HOSPITAL_COMMUNITY): Payer: Medicare Other

## 2014-01-02 ENCOUNTER — Inpatient Hospital Stay (HOSPITAL_COMMUNITY): Payer: Medicare Other

## 2014-01-02 DIAGNOSIS — W010XXA Fall on same level from slipping, tripping and stumbling without subsequent striking against object, initial encounter: Secondary | ICD-10-CM

## 2014-01-02 DIAGNOSIS — Z4889 Encounter for other specified surgical aftercare: Secondary | ICD-10-CM

## 2014-01-02 DIAGNOSIS — S72009A Fracture of unspecified part of neck of unspecified femur, initial encounter for closed fracture: Secondary | ICD-10-CM

## 2014-01-02 DIAGNOSIS — I25810 Atherosclerosis of coronary artery bypass graft(s) without angina pectoris: Secondary | ICD-10-CM

## 2014-01-02 DIAGNOSIS — D62 Acute posthemorrhagic anemia: Secondary | ICD-10-CM

## 2014-01-02 DIAGNOSIS — I4891 Unspecified atrial fibrillation: Secondary | ICD-10-CM

## 2014-01-02 LAB — BASIC METABOLIC PANEL
BUN: 8 mg/dL (ref 6–23)
CHLORIDE: 99 meq/L (ref 96–112)
CO2: 24 meq/L (ref 19–32)
Calcium: 9.1 mg/dL (ref 8.4–10.5)
Creatinine, Ser: 0.58 mg/dL (ref 0.50–1.10)
GFR calc non Af Amer: 81 mL/min — ABNORMAL LOW (ref >90)
Glucose, Bld: 96 mg/dL (ref 70–99)
POTASSIUM: 4.2 meq/L (ref 3.7–5.3)
Sodium: 135 mEq/L — ABNORMAL LOW (ref 137–147)

## 2014-01-02 NOTE — Progress Notes (Signed)
Subjective/Complaints:  Had a good day yesterday. Still a little anxious about going home.   A 12 point review of systems has been performed and if not noted above is otherwise negative.   Objective: Vital Signs: Blood pressure 134/52, pulse 72, temperature 98.1 F (36.7 C), temperature source Oral, resp. rate 17, height 5' 1"  (1.549 m), weight 48.852 kg (107 lb 11.2 oz), SpO2 98.00%. No results found. No results found for this basename: WBC, HGB, HCT, PLT,  in the last 72 hours  Recent Labs  12/31/13 0622 01/02/14 0625  NA 134* 135*  K 3.9 4.2  CL 100 99  GLUCOSE 84 96  BUN 12 8  CREATININE 0.56 0.58  CALCIUM 8.9 9.1   CBG (last 3)  No results found for this basename: GLUCAP,  in the last 72 hours  Wt Readings from Last 3 Encounters:  12/26/13 48.852 kg (107 lb 11.2 oz)  12/07/13 44.453 kg (98 lb)  12/07/13 44.453 kg (98 lb)    Physical Exam:  Constitutional: She is oriented to person, place, and time. No distress.  HENT:  Head: Normocephalic.  Eyes: EOM are normal.  Neck: Normal range of motion. Neck supple. No thyromegaly present.   Cardiovascular:  Cardiac rate controlled. Irreg No murmur. Chest incision clean/healed  Respiratory: Effort normal and breath sounds normal. No respiratory distress. No rales or wheezes  GI: Soft. Bowel sounds are normal. She exhibits no real distension.  Neurological: She is alert and oriented to person, place, and time.  Musc: right leg quite tender. Edema 1+ RLE (improved) Follows commandsSkin:  Hip incision dry, intact,  Tender to touch along area of incision. Hip/leg aligned. Pain in right hip with basic PROM still. Incision site pronounced-likely scar, could be slight hematoma also Psych: anxious slightly. Generally appropriate  Assessment/Plan: 1. Functional deficits secondary to right femoral neck fracture which require 3+ hours per day of interdisciplinary therapy in a comprehensive inpatient rehab  setting. Physiatrist is providing close team supervision and 24 hour management of active medical problems listed below. Physiatrist and rehab team continue to assess barriers to discharge/monitor patient progress toward functional and medical goals.  It appears that she will go back to her "cottage" at [employer] and perhaps hire some help in the AM---SW to follow up  FIM: FIM - Bathing Bathing Steps Patient Completed: Chest;Right Arm;Left Arm;Abdomen;Front perineal area;Buttocks;Right upper leg;Left upper leg Bathing: 4: Min-Patient completes 8-9 [id number] parts or 75+ percent  FIM - Upper Body Dressing/Undressing Upper body dressing/undressing steps patient completed: Thread/unthread right bra strap;Thread/unthread left bra strap;Thread/unthread right sleeve of pullover shirt/dresss;Thread/unthread left sleeve of pullover shirt/dress;Put head through opening of pull over shirt/dress;Pull shirt over trunk Upper body dressing/undressing: 4: Min-Patient completed 75 plus % of tasks FIM - Lower Body Dressing/Undressing Lower body dressing/undressing steps patient completed: Thread/unthread right underwear leg;Thread/unthread left underwear leg;Pull underwear up/down;Thread/unthread right pants leg;Thread/unthread left pants leg;Pull pants up/down;Fasten/unfasten pants;Don/Doff left sock;Don/Doff left shoe Lower body dressing/undressing: 4: Min-Patient completed 75 plus % of tasks  FIM - Toileting Toileting steps completed by patient: Adjust clothing prior to toileting;Performs perineal hygiene;Adjust clothing after toileting Toileting Assistive Devices: Grab bar or rail for support Toileting: 5: Set-up assist to: Obtain supplies  FIM - TRadio producerDevices: Grab bars Toilet Transfers: 5-To toilet/BSC: Supervision (verbal cues/safety issues);5-From toilet/BSC: Supervision (verbal cues/safety issues)  FIM - BEngineer, siteAssistive  Devices: Bed rails Bed/Chair Transfer: 4: Supine > Sit:  Min A (steadying Pt. > 75%/lift 1 leg);4: Sit > Supine: Min A (steadying pt. > 75%/lift 1 leg);5: Bed > Chair or W/C: Supervision (verbal cues/safety issues);5: Chair or W/C > Bed: Supervision (verbal cues/safety issues)  FIM - Locomotion: Wheelchair Locomotion: Wheelchair: 1: Total Assistance/staff pushes wheelchair (Pt<25%) FIM - Locomotion: Ambulation Locomotion: Ambulation Assistive Devices: Administrator Ambulation/Gait Assistance: 5: Supervision Locomotion: Ambulation: 5: Travels 150 ft or more with supervision/safety issues  Comprehension Comprehension Mode: Auditory Comprehension: 6-Follows complex conversation/direction: With extra time/assistive device  Expression Expression Mode: Verbal Expression: 6-Expresses complex ideas: With extra time/assistive device  Social Interaction Social Interaction: 5-Interacts appropriately 90% of the time - Needs monitoring or encouragement for participation or interaction.  Problem Solving Problem Solving: 4-Solves basic 75 - 89% of the time/requires cueing 10 - 24% of the time  Memory Memory: 5-Recognizes or recalls 90% of the time/requires cueing < 10% of the time  Medical Problem List and Plan:  1. Right femoral neck fracture secondary to fall. Status post hemiarthroplasty 12/09/2013 weightbearing as tolerated with posterior hip cautions   -robaxin, heat, ice for spasms 2. DVT/ Anticoagulation: Xarelto. Has history of RLE DVT  -elevate right leg and compress-- needs TEDS on every day 3. Pain Management: Zanaflex 2 mg 3 times a day,--will increase HS dose to 47m for HS spasms.  - Ultram 50 mg every 6 hours prn  -may have small hematoma---heat as needed to area  -robaxin as above   -try lidoderm patches around hip area needed pain. May have to back off ultram if she continues to have hallucinations or AMS 4. Neuropsych: This patient is capable of making decisions on her own  behalf.   -occasional hallucinations as above 5. Acute blood loss anemia. Latest hemoglobin up to 9.8  6. SIADH. Sodium level remains at 135. DC'ed FR 1500 cc. DC salt tabs  - Continue low-dose Lasix  per nephrology recs  -potassium now nl  -rec follow up as an outpt 7. Diastolic congestive heart failure. No signs of fluid overload. Continue low-dose diuretic  8. CAD/CABG. Followup cardiology services as needed. No chest pain or shortness of breath   -still orthostatic---TEDS, abdominal binder when up. Stopped flomax. Volume status looks ok  -FR dc'ed  -outpt follow up 9. Atrial fibrillation. Cardiac rate control. Continue xarelto  10. Hypothyroidism. Synthroid. Latest TSH level 2.175  11. Klebsiella/Escherichia coli UTI with urinary retention.  - coag neg staph---60k,   5 days of macrobid complete  -cath prn  -needs to be up to void and need to continue timed voiding attempts  -continue with bowel rx  -dc flomax per above  12. Skin breakdown right heel-- stable  -elevate   -prevalon boot in bed  -darco shoe with posterior cut out for gait  -outpt wound follow up as indicated 13. Constipation:   -continue current meds   LOS (Days) 21 A FACE TO FACE EVALUATION WAS PERFORMED  SWARTZ,ZACHARY T 01/02/2014 8:03 AM

## 2014-01-02 NOTE — Discharge Summary (Signed)
Discharge summary job [id number]

## 2014-01-02 NOTE — Progress Notes (Signed)
Occupational Therapy Session Note  Patient Details  Name: Maria [name] MRN: [id number] Date of Birth: [birth year]  Today's Date: 01/02/2014 Time: 0930-1029 Time Calculation (min): 59 min  Short Term Goals: Week 3:  OT Short Term Goal 1 (Week 3): STG=LTG  Skilled Therapeutic Interventions/Progress Updates: ADL-retraining with focus on family education and training with patient's husband, Vivienne Sangiovanni, MD.    Patient received sitting in her recliner, fully dressed and conversing with her husband, seated nearby.   OT reviewed patient's performance with ADL and educated husband on method of assist for bathing/dressing of right foot, impaired by hypersensitivity to pain at right heal (d/t pressure wound).   Patient then demo'd her ability to use both her sock aid and reacher to don socks, with extra time and setup assist.  OT also reviewed patient's need for assist with donning/doffing orthotic right shoe and  for intermittent supervision to maintain hip precautions specifically when she is distracted by external stimuli that challenges her alternating and divided attention.   Pt then ambulated from her room to ADL apartment and demonstrated her competence with transfer in/out of standard bed, with her functional mobility and with her general endurance.   Patient's husband was attentive throughout session and encouraged patient to endure temporary discomfort from changes in position, activity, and muscle strain while she engaged in typical ADL.    Therapy Documentation Precautions:  Precautions Precautions: Posterior Hip;Fall Precaution Booklet Issued: Yes (comment) Precaution Comments: Pt recalls 2/3 precautions Required Braces or Orthoses: Other Brace/Splint Other Brace/Splint: Prevalon Boot for Rt. Heel  Restrictions Weight Bearing Restrictions: Yes RLE Weight Bearing: Weight bearing as tolerated  Pain: Pain Assessment Pain Assessment: No/denies pain  ADL: ADL ADL Comments: see  FIM  See FIM for current functional status  Therapy/Group: Individual Therapy  Second session: Time: 1345-1415 Time Calculation (min):  30 min  Pain Assessment:  No pain  Skilled Therapeutic Interventions:  ADL-retraining with focus on family education and training on supervision of B-UE seated exercises using thera-band.  With continuous supervision, using literature provided, patient demo'd ability to perform exercises as her husband observed.   Patient's spouse was attentive throughout session and requested clarification on performance of 3 of 6 exercises completed.   Patient will require supervision to insure she performs exercises when recommended (3X/wk) with anticipated duration of exercise session lasting approximately 30 minutes (10-15 repetitions for each).   See FIM for current functional status  Therapy/Group: Individual Therapy  Dyshawn Cangelosi 01/02/2014, 10:30 AM

## 2014-01-02 NOTE — Progress Notes (Addendum)
Physical Therapy Discharge Summary  Patient Details  Name: Maria [name] MRN: [id number] Date of Birth: 12/17/1923  Today's Date: 01/02/2014 Time: 1300-1345 Time Calculation (min): 45 min Session focused on family education of husband. Husband provided supervision for ambulation 2 x >200' ambulation with RW, car transfer with min cues from therapist. Husband able to teach back prior instruction for seat positioning to maintain pt's precautions.  PT demonstration of curb step-husband providing min-guard assist as pt performed curb step x 2 with RW. Home environment ambulation and ADL bed transfer with leg lifter, husband able to adequately monitor pt for maintaining precautions. Obstacle negotiation with husband providing supervision, addressed need to side step instead of lifting RW.  Stressed importance of maintianing precautions to pt - husband seems to do well with helping pt maintain them. Pain 3-4/10 Rt heel and Rt hip muscles premedicated.  Second Session Time:  1300-1345 Time Calculation (min): 45 min Skilled Therapeutic Interventions/Progress Updates:  Session continued to focus on family education. Balance program established (back to corner with RW in front for safety, husband supervision) varying visual input (head turns vertical/horizontal; eyes closed) and base of support (precautions maintained), handout provided. Walking program established with progression, handout provided. Husband and pt report no further questions at this time. Reiterated importance of close supervision at all times when pt on her feet, husband and wife verbalized understanding. Husband provided supervision for ambulation in controlled environment 2 x >200' with RW.  Pain 3-4/10 Rt heel, repositioned for relief.    Patient has met 8 of 8 long term goals due to improved activity tolerance, improved balance, improved postural control, increased strength, decreased pain, ability to compensate for deficits and  improved awareness.  Patient has made great gains progressing from mod assist and barely able to take steps to discharge at an ambulatory level Supervision.   She continues to have difficulty with pain management of Rt hip and Rt heel, she also struggles to remember and maintain posterior hip precautions during functional mobility. Patient's care partner is independent to provide the necessary physical assistance at discharge.  Reasons goals not met: NA  Recommendation:  Patient will benefit from ongoing skilled PT services in home health setting to continue to advance safe functional mobility, address ongoing impairments in balance, decreased adherence to posterior hip precautions, decreased functional mobility, pain, and minimize fall risk.  Equipment: w/c, RW  Reasons for discharge: treatment goals met and discharge from hospital  Patient/family agrees with progress made and goals achieved: Yes  PT Discharge Precautions/Restrictions Precautions Precaution Comments: Pt recalls 3/3 precautions however needs frequent cues during mobility to maintain them Required Braces or Orthoses: Other Brace/Splint Other Brace/Splint: Rt Darco Boot with Rt heel cut out Restrictions RLE Weight Bearing: Weight bearing as tolerated Cognition Overall Cognitive Status: Difficult to assess Arousal/Alertness: Awake/alert Orientation Level: Oriented X4 Memory: Impaired Memory Impairment: Decreased recall of new information Awareness: Impaired Problem Solving: Impaired Safety/Judgment: Impaired Sensation Sensation Light Touch: Impaired Detail Light Touch Impaired Details: Impaired RLE (decreased compared to Lt LE due to increased swelling per pt) Coordination Gross Motor Movements are Fluid and Coordinated: Yes Motor  Motor Motor: Within Functional Limits  Mobility Bed Mobility Bed Mobility: Rolling Right;Rolling Left;Left Sidelying to Sit;Supine to Sit;Sit to Supine Rolling Right: 5:  Supervision Rolling Left: 5: Supervision Supine to Sit: 5: Supervision Supine to Sit Details (indicate cue type and reason): Pt utilizes leg lifter - occasionally needs cues for maintaining hip precautions Sit to Supine: 5: Supervision Sit to Supine -   Details (indicate cue type and reason): Needs cues to avoid internal rotation of Rt hip Transfers Transfers: Yes Sit to Stand: 5: Supervision Stand to Sit: 5: Supervision Stand Pivot Transfers: 5: Supervision Stand Pivot Transfer Details (indicate cue type and reason): Cues for maintaining precautions particularly with turns to Rt, supervision due to impaired balance.  Locomotion  Ambulation Ambulation: Yes Ambulation/Gait Assistance: 5: Supervision Ambulation Distance (Feet): 200 Feet Assistive device: Rolling walker Ambulation/Gait Assistance Details: Cues for maintaining precautions particularly with turns to Rt, at times pt needs cues for safe management of RW Gait Gait: Yes Gait Pattern: Impaired Gait Pattern: Step-through pattern;Decreased stride length;Decreased weight shift to right;Trunk flexed (decreased foot clearance ) High Level Ambulation High Level Ambulation: Side stepping;Backwards walking;Direction changes Side Stepping: supervision Backwards Walking: supervision Direction Changes: supervision Stairs / Additional Locomotion Stairs: Yes (curb step) Stairs Assistance: 4: Min guard Stairs Assistance Details (indicate cue type and reason): Pt practiced curb step-up. Cues for sequencing and safety.  Stair Management Technique: Step to pattern;Forwards;With walker Number of Stairs: 1 Wheelchair Mobility Wheelchair Mobility: Yes Wheelchair Assistance: 5: Careers information officer: Both upper extremities Wheelchair Parts Management: Needs assistance Distance: 150'  Trunk/Postural Assessment  Cervical Assessment Cervical Assessment: Exceptions to [facility] (forward head) Thoracic Assessment Thoracic Assessment:  Exceptions to [facility] (kyphotic) Postural Control Postural Limitations: tends to have posterior loss of balance, husband aware and aware of positioning to minimize risk of falls.   Balance Static Sitting Balance Static Sitting - Balance Support: Bilateral upper extremity supported Static Sitting - Level of Assistance: 6: Modified independent (Device/Increase time) Static Standing Balance Static Standing - Balance Support: Bilateral upper extremity supported Static Standing - Level of Assistance: 5: Stand by assistance Extremity Assessment      RLE Assessment RLE Assessment: Exceptions to [facility] RLE Strength RLE Overall Strength Comments: Tolerates min resistance with hip flexion, knee extension, and dorsiflexion however unable to perform appropriate MMT due to pain.  LLE Assessment LLE Assessment: Exceptions to [facility] LLE Strength LLE Overall Strength Comments: Generalized deconditioning grossly >/= 4-/5  See FIM for current functional status  Lahoma Rocker 01/02/2014, 3:33 PM

## 2014-01-03 MED ORDER — LINACLOTIDE 145 MCG PO CAPS: 145.0000 ug | ORAL_CAPSULE | Freq: Every day | ORAL | Status: DC

## 2014-01-03 MED ORDER — METHOCARBAMOL 500 MG PO TABS: 500.0000 mg | ORAL_TABLET | Freq: Four times a day (QID) | ORAL | Status: DC | PRN

## 2014-01-03 MED ORDER — LIDOCAINE 5 % EX PTCH: 2.0000 | MEDICATED_PATCH | CUTANEOUS | Status: DC

## 2014-01-03 MED ORDER — FUROSEMIDE 20 MG PO TABS: 20.0000 mg | ORAL_TABLET | Freq: Every day | ORAL | Status: DC

## 2014-01-03 MED ORDER — OMEPRAZOLE 20 MG PO CPDR: 20.0000 mg | DELAYED_RELEASE_CAPSULE | ORAL | Status: DC

## 2014-01-03 MED ORDER — LEVOTHYROXINE SODIUM 50 MCG PO TABS: 50.0000 ug | ORAL_TABLET | Freq: Every day | ORAL | Status: DC

## 2014-01-03 MED ORDER — TRAMADOL HCL 50 MG PO TABS: 50.0000 mg | ORAL_TABLET | Freq: Four times a day (QID) | ORAL | Status: DC | PRN

## 2014-01-03 MED ORDER — RIVAROXABAN 20 MG PO TABS: 20.0000 mg | ORAL_TABLET | Freq: Every day | ORAL | Status: DC

## 2014-01-03 MED ORDER — TIZANIDINE HCL 4 MG PO TABS: 4.0000 mg | ORAL_TABLET | Freq: Every day | ORAL | Status: DC

## 2014-01-03 NOTE — Discharge Summary (Signed)
Maria [name], Maria [name]             ACCOUNT NO.:  [id number]  MEDICAL RECORD NO.:  [id number]  LOCATION:  4W02C                        FACILITY:  [facility]  PHYSICIAN:  Meredith Staggers, M.D.DATE OF BIRTH:  03/26/1924  DATE OF ADMISSION:  12/12/2013 DATE OF DISCHARGE:  01/03/2014                              DISCHARGE SUMMARY   DISCHARGE DIAGNOSES: 1. Right femoral neck fracture status post hemiarthroplasty. 2. Xarelto for deep vein thrombosis prophylaxis. 3. Pain management. 4. Acute blood loss anemia. 5. Syndrome of inappropriate antidiuretic hormone. 6. Diastolic congestive heart failure. 7. Coronary artery disease with coronary artery bypass graft. 8. Atrial fibrillation. 9. Hypothyroidism. 10.Klebsiella E. coli urinary tract infection resolved. 11.Skin breakdown right heel.  HISTORY OF PRESENT ILLNESS:  This is a 80 year old right-handed female with history of CAD with bypass grafting, hypertension, diastolic congestive heart failure, DVT in 2014 as well as atrial fibrillation maintained on Xarelto and hyponatremia-SIADH, sodium 125-129.  The patient independent prior to admission living with her husband who is a retired Engineer, drilling.  Patient presented on December 08, 2013, after a fall when she slipped and fell on the hardwood floor, there was no loss of consciousness, landing on her right hip.  X-rays and imaging revealed right femoral neck fracture.  Preoperative hyponatremia 124 received intravenous fluids of normal saline.  Underwent right hip hemiarthroplasty on December 09, 2013, per Dr. Mayer Camel.  Weightbearing as tolerated with posterior hip precautions.  Xarelto resumed for history of atrial fibrillation, DVT.  Postoperative pain control.  Acute blood loss anemia 9.4 and monitored.  Follow up renal services for history of SIADH placed on low-dose Lasix.  Latest sodium 126-129 had been on a fluid restriction.  Urine study showed greater than 100,000 Klebsiella E. coli,  urinary tract infection maintained on Rocephin changed to Cipro.  Physical and occupational therapy on going.  The patient was admitted for comprehensive rehab program.  PAST MEDICAL HISTORY:  See discharge diagnoses.  SOCIAL HISTORY:  Lives with spouse who is a retired Engineer, drilling. Functional status prior to admission was independent.  Functional status upon admission to rehab services was ambulating 12 feet with a slowed gait velocity needing cues for sequencing, required physical assist advanced right lower extremity, +1 total assist lower body bathing, dressing.  PHYSICAL EXAMINATION:  VITAL SIGNS:  Blood pressure 132/52, pulse 70, temperature 97.9, respirations 16. GENERAL:  This was an alert female, oriented x3, follow commands, she was somewhat distracted. LUNGS:  Clear to auscultation. CARDIAC:  Rate controlled. ABDOMEN:  Soft, nontender.  Good bowel sounds. SKIN:  Hip incision clean and dry.  REHABILITATION HOSPITAL COURSE:  The patient was admitted to inpatient rehab services with therapies initiated on a 3-hour daily basis consisting of physical therapy, occupational therapy, and rehabilitation nursing.  The following issues were addressed during the patient's rehabilitation stay.  Pertaining to Ms. Holtrop right femoral neck fracture she had undergone hemiarthroplasty on December 09, 2013, per Dr. Mayer Camel.  She was weightbearing as tolerated with posterior hip precautions.  Neurovascular sensation intact.  She remained on Xarelto for history of DVT as well as atrial fibrillation with cardiac rate controlled.  Pain management with the use of Zanaflex as well as Lidoderm patch with  Robaxin for breakthrough muscle spasms, and Ultram for pain monitoring neurological status closely.  Noted acute blood loss anemia.  No bleeding episodes while on rehab services, latest hemoglobin of 9.8.  Noted history of SIADH.  She had been followed by renal services and maintained on a fluid  restriction of 1500 mL, it was later discontinued with latest sodium 134.  She had been on salt tablets, these had also been later discontinued.  She would follow up with renal services as needed.  She had completed a course of Cipro, changed to [place] for sensitivities of Klebsiella E. coli, urinary tract infection.  Denied any dysuria.  Bouts of constipation as she remained on MiraLAX as well as Linzess.  No nausea or vomiting.Skin breakdown right heel with prevalon boot in bed and darco boot right foot.Out patient follow up for wound care.  The patient received weekly collaborative interdisciplinary team conferences to discuss estimated length of stay, family teaching, and any barriers to discharge.  She was ambulating 120 feet rolling walker, standby assistance.  Following hip precautions as advised with some cueing.  Sit to stand from mat focusing on decrease use of upper extremities and eccentric control stand to sit.  Activities of daily living; focusing on improved attention and memory related to adherence of hip precautions. The patient completed assigned tasks to bathe in shower and dress sitting and standing with supervision required, again to adhere to hip precautions.  She continued to demonstrates an impaired memory required minimal verbal cues for these precautions.  Full family teaching was completed and plan was to be discharged to home.  DISCHARGE MEDICATIONS: 1. Lasix 20 mg p.o. daily. 2. Synthroid 50 mcg p.o. daily. 3. Lidoderm patch change every 12 hours. 4. Linzess 145 mcg p.o. daily. 5. Robaxin 500 mg p.o. every 6 hours as needed muscle spasms. 6. MiraLAX 17 g p.o. daily hold for loose stools. 7. Xarelto 20 mg p.o. daily. 8. Senokot-S tablets 2 p.o. b.i.d. 9. Zanaflex 2 mg p.o. b.i.d. and 4 mg p.o. at bedtime. 10.Ultram 50 mg p.o. every 6 hours as needed for moderate pain,     dispense of 90 tablets.  DIET:  Regular.  SPECIAL INSTRUCTIONS:  She would follow  up with Dr. Alger Simons the outpatient rehab service office as needed.  Dr. Frederik Pear orthopedic services 2 weeks call for appointment.  Dr. Roney Jaffe renal services as needed for history of SIADH.  Dr. [name] medical management appointment to be made.  The patient is maintained on gluten free diet.Follow up sodium level one week with PCP. She was weightbearing as tolerated with posterior hip precautions, right lower extremity with prevalon boot.  Ongoing therapies were arranged as per rehab services.     Lauraine Rinne, P.A.   ______________________________ Meredith Staggers, M.D.    DA/MEDQ  D:  01/02/2014  T:  01/03/2014  Job:  [id number]  cc:   Meredith Staggers, M.D. Kathalene Frames. Mayer Camel, M.D. Sol Blazing, M.D. Hal T. Stoneking, M.D.

## 2014-01-03 NOTE — Progress Notes (Signed)
Occupational Therapy Discharge Summary  Patient Details  Name: Maria [name] MRN: [id number] Date of Birth: [birth year]  Today's Date: 01/04/2014  Patient has met 11 of 11 long term goals due to improved activity tolerance, ability to compensate for deficits and improved awareness.  Patient to discharge at [facility] Assist level.  Patient's care partner is independent to provide the necessary physical and cognitive assistance at discharge to provide needed cues to maintain hip precautions, to provide minimal assistance with lower body bathing/dressing (right foot), and to provide supervision during dynamic standing activities.    Reasons goals not met: n/a  Recommendation:  Patient will benefit from ongoing skilled OT services in home health setting to continue to advance functional skills in the area of BADL.  Equipment: No equipment provided  Reasons for discharge: treatment goals met  Patient/family agrees with progress made and goals achieved: Yes  OT Discharge Precautions/Restrictions  Precautions Precautions: Fall Precaution Booklet Issued: Yes (comment) Precaution Comments: Pt recalls 3/3 precautions however needs frequent cues during mobility to maintain them Required Braces or Orthoses: Other Brace/Splint Other Brace/Splint: Rt Darco Boot with Rt heel cut out Restrictions Weight Bearing Restrictions: No RLE Weight Bearing: Weight bearing as tolerated  Pain Pain Assessment Pain Assessment: No/denies pain  ADL ADL ADL Comments: see FIM  Vision/Perception  Vision - History Baseline Vision: No visual deficits Patient Visual Report: No change from baseline Vision - Assessment Eye Alignment: Within Functional Limits Perception Perception: Within Functional Limits Praxis Praxis: Intact   Cognition Overall Cognitive Status: Within Functional Limits for tasks assessed Arousal/Alertness: Awake/alert Orientation Level: Oriented X4 Attention:  Selective Sustained Attention: Appears intact Selective Attention: Appears intact Memory: Impaired Memory Impairment: Decreased recall of new information Awareness: Appears intact Problem Solving: Impaired Problem Solving Impairment: Functional complex Decision Making: Appears intact Initiating: Appears intact Behaviors: Poor frustration tolerance Safety/Judgment: Impaired Comments: alternating and divided attention challenged by over-stimulation  Sensation Sensation Light Touch: Impaired Detail Light Touch Impaired Details: Impaired RLE Stereognosis: Appears Intact Hot/Cold: Appears Intact Proprioception: Appears Intact Coordination Gross Motor Movements are Fluid and Coordinated: Yes Fine Motor Movements are Fluid and Coordinated: Yes  Motor  Motor Motor: Within Functional Limits  Mobility  Bed Mobility Bed Mobility: Rolling Right;Rolling Left;Left Sidelying to Sit;Supine to Sit;Sit to Supine Rolling Right: 5: Supervision Rolling Left: 5: Supervision Supine to Sit: 5: Supervision Supine to Sit Details (indicate cue type and reason): occassionally needs cue to maintain hip precautions Sit to Supine: 5: Supervision Transfers Transfers: Sit to Stand;Stand to Sit Sit to Stand: 5: Supervision Sit to Stand Details: Verbal cues for precautions/safety Stand to Sit: 5: Supervision Stand to Sit Details (indicate cue type and reason): Verbal cues for precautions/safety   Trunk/Postural Assessment  Cervical Assessment Cervical Assessment: Exceptions to [facility] Thoracic Assessment Thoracic Assessment: Exceptions to [facility] Thoracic AROM Overall Thoracic AROM: Deficits Lumbar Assessment Lumbar Assessment: Within Functional Limits Postural Control Postural Control: Within Functional Limits Postural Limitations: tends to have posterior loss of balance, husband aware and aware of positioning to minimize risk of falls.    Balance Static Sitting Balance Static Sitting - Level of  Assistance: 6: Modified independent (Device/Increase time) Static Standing Balance Static Standing - Balance Support: Bilateral upper extremity supported Static Standing - Level of Assistance: 5: Stand by assistance  Extremity/Trunk Assessment RUE Assessment RUE Assessment: Within Functional Limits LUE Assessment LUE Assessment: Within Functional Limits  See FIM for current functional status  [place] 01/04/2014, 6:58 AM

## 2014-01-03 NOTE — Progress Notes (Signed)
Subjective/Complaints:  Home today. Excited but a little anxious!!.   A 12 point review of systems has been performed and if not noted above is otherwise negative.   Objective: Vital Signs: Blood pressure 162/73, pulse 73, temperature 97.3 F (36.3 C), temperature source Oral, resp. rate 18, height 5' 1"  (1.549 m), weight 48.852 kg (107 lb 11.2 oz), SpO2 98.00%. No results found. No results found for this basename: WBC, HGB, HCT, PLT,  in the last 72 hours  Recent Labs  01/02/14 0625  NA 135*  K 4.2  CL 99  GLUCOSE 96  BUN 8  CREATININE 0.58  CALCIUM 9.1   CBG (last 3)  No results found for this basename: GLUCAP,  in the last 72 hours  Wt Readings from Last 3 Encounters:  12/26/13 48.852 kg (107 lb 11.2 oz)  12/07/13 44.453 kg (98 lb)  12/07/13 44.453 kg (98 lb)    Physical Exam:  Constitutional: She is oriented to person, place, and time. No distress.  HENT:  Head: Normocephalic.  Eyes: EOM are normal.  Neck: Normal range of motion. Neck supple. No thyromegaly present.   Cardiovascular:  Cardiac rate controlled. Irreg No murmur. Chest incision clean/healed  Respiratory: Effort normal and breath sounds normal. No respiratory distress. No rales or wheezes  GI: Soft. Bowel sounds are normal. She exhibits no real distension.  Neurological: She is alert and oriented to person, place, and time.  Musc: right leg quite tender. Edema 1+ RLE (improved) Follows commandsSkin:  Hip incision dry, intact,  Tender to touch along area of incision. Hip/leg aligned. Pain in right hip with basic PROM still. Incision site pronounced-likely scar, could be slight hematoma also Psych: anxious slightly. Generally appropriate  Assessment/Plan: 1. Functional deficits secondary to right femoral neck fracture which require 3+ hours per day of interdisciplinary therapy in a comprehensive inpatient rehab setting. Physiatrist is providing close team supervision and 24 hour management  of active medical problems listed below. Physiatrist and rehab team continue to assess barriers to discharge/monitor patient progress toward functional and medical goals.    FIM: FIM - Bathing Bathing Steps Patient Completed: Chest;Right Arm;Left Arm;Abdomen;Front perineal area;Buttocks;Right upper leg;Left upper leg Bathing: 4: Min-Patient completes 8-9 65f10 parts or 75+ percent  FIM - Upper Body Dressing/Undressing Upper body dressing/undressing steps patient completed: Thread/unthread right bra strap;Thread/unthread left bra strap;Thread/unthread right sleeve of pullover shirt/dresss;Thread/unthread left sleeve of pullover shirt/dress;Put head through opening of pull over shirt/dress;Pull shirt over trunk Upper body dressing/undressing: 4: Min-Patient completed 75 plus % of tasks FIM - Lower Body Dressing/Undressing Lower body dressing/undressing steps patient completed: Thread/unthread right underwear leg;Thread/unthread left underwear leg;Pull underwear up/down;Thread/unthread right pants leg;Thread/unthread left pants leg;Pull pants up/down;Fasten/unfasten pants;Don/Doff left sock;Don/Doff left shoe Lower body dressing/undressing: 4: Min-Patient completed 75 plus % of tasks  FIM - Toileting Toileting steps completed by patient: Adjust clothing prior to toileting;Performs perineal hygiene;Adjust clothing after toileting Toileting Assistive Devices: Grab bar or rail for support Toileting: 5: Set-up assist to: Obtain supplies  FIM - TRadio producerDevices: Grab bars Toilet Transfers: 5-To toilet/BSC: Supervision (verbal cues/safety issues);5-From toilet/BSC: Supervision (verbal cues/safety issues)  FIM - BControl and instrumentation engineerDevices: Bed rails Bed/Chair Transfer: 4: Supine > Sit: Min A (steadying Pt. > 75%/lift 1 leg);4: Sit > Supine: Min A (steadying pt. > 75%/lift 1 leg);5: Bed > Chair or W/C: Supervision (verbal cues/safety  issues);5: Chair or W/C > Bed: Supervision (verbal cues/safety issues)  FIM - Locomotion: Wheelchair Distance: 150' Locomotion: Wheelchair: 1: Total Assistance/staff pushes wheelchair (Pt<25%) FIM - Locomotion: Ambulation Locomotion: Ambulation Assistive Devices: Administrator Ambulation/Gait Assistance: 5: Supervision Locomotion: Ambulation: 5: Travels 150 ft or more with supervision/safety issues  Comprehension Comprehension Mode: Auditory Comprehension: 6-Follows complex conversation/direction: With extra time/assistive device  Expression Expression Mode: Verbal Expression: 6-Expresses complex ideas: With extra time/assistive device  Social Interaction Social Interaction: 5-Interacts appropriately 90% of the time - Needs monitoring or encouragement for participation or interaction.  Problem Solving Problem Solving: 4-Solves basic 75 - 89% of the time/requires cueing 10 - 24% of the time  Memory Memory: 5-Recognizes or recalls 90% of the time/requires cueing < 10% of the time  Medical Problem List and Plan:  1. Right femoral neck fracture secondary to fall. Status post hemiarthroplasty 12/09/2013 weightbearing as tolerated with posterior hip cautions   -robaxin, heat, ice for spasms 2. DVT/ Anticoagulation: Xarelto. Has history of RLE DVT  -elevate right leg and compress-- needs TEDS on every day 3. Pain Management: Zanaflex 2 mg 3 times a day,--will increase HS dose to 34m for HS spasms.  - Ultram 50 mg every 6 hours prn  -may have small hematoma---heat as needed to area  -robaxin as above   -try lidoderm patches around hip area needed pain. May have to back off ultram if she continues to have hallucinations or AMS 4. Neuropsych: This patient is capable of making decisions on her own behalf.   -occasional hallucinations as above 5. Acute blood loss anemia. Latest hemoglobin up to 9.8  6. SIADH. Sodium level remains at 135. DC'ed FR 1500 cc. DC salt tabs  - Continue  low-dose Lasix  per nephrology recs  -potassium now nl  -rec follow up as an outpt 7. Diastolic congestive heart failure. No signs of fluid overload. Continue low-dose diuretic  8. CAD/CABG. Followup cardiology services as needed. No chest pain or shortness of breath   -still orthostatic---TEDS, abdominal binder when up. Stopped flomax. Volume status looks ok  -FR dc'ed  -outpt follow up 9. Atrial fibrillation. Cardiac rate control. Continue xarelto  10. Hypothyroidism. Synthroid. Latest TSH level 2.175  11. Klebsiella/Escherichia coli UTI with urinary retention.  - coag neg staph---60k,   5 days of macrobid complete  -cath prn  -needs to be up to void and need to continue timed voiding attempts  -continue with bowel rx  -dc flomax per above  12. Skin breakdown right heel-- stable  -elevate   -prevalon boot in bed  -darco shoe with posterior cut out for gait  -outpt wound follow up as indicated 13. Constipation:   -continue current meds   LOS (Days) 22 A FACE TO FACE EVALUATION WAS PERFORMED  SWARTZ,ZACHARY T 01/03/2014 8:23 AM

## 2014-01-03 NOTE — Progress Notes (Signed)
Pt discharged home with husband. Discharge instructions provided by Silvestre Mesi, PA. All questions answered. Pt verbalized understanding. Pt escorted off unit in w/c with personal belonging by Phebe Colla, NT  Medications that patient received prior to discharge was discussed with husband. Patient also taken down to medical record to sign release for medical information so that pt could have copy of medical record mailed to her home.

## 2014-01-04 ENCOUNTER — Ambulatory Visit (HOSPITAL_COMMUNITY): Payer: Medicare Other

## 2014-01-04 NOTE — Progress Notes (Addendum)
Social Work Patient ID: Maria [name], female   DOB: 06-25-1924, 80 y.o.   MRN: [id number]  Maria Deck Ivania Teagarden, LCSW Social Worker Signed  Patient Care Conference Service date: 01/01/2014 9:59 PM  Inpatient RehabilitationTeam Conference and Plan of Care Update Date: 01/01/2014   Time: 2:00 PM     Patient Name: Maria [name]       Medical Record Number: [id number]   Date of Birth: [birth year] Sex: Female         Room/Bed: 4W02C/4W02C-01 Payor Info: Payor: MEDICARE / Plan: MEDICARE PART A AND B / Product Type: *No Product type* /   Admitting Diagnosis: FN FX   Admit Date/Time:  12/12/2013  6:05 PM Admission Comments: No comment available   Primary Diagnosis:  <principal problem not specified> Principal Problem: <principal problem not specified>    Patient Active Problem List     Diagnosis  Date Noted   .  Femoral neck fracture  12/12/2013   .  Atonic bladder  12/11/2013   .  Acute confusional state  12/11/2013   .  Hyponatremia  12/08/2013   .  Fall  12/08/2013   .  Closed right hip fracture  12/07/2013   .  UTI (lower urinary tract infection)  12/07/2013   .  Hip fracture, right  12/07/2013   .  Bradycardia  11/07/2013   .  Anticoagulation goal of INR 2.5 to 3.5  11/07/2013   .  Protein-calorie malnutrition, severe  10/04/2013   .  DVT (deep venous thrombosis)  10/03/2013   .  Pleural effusion  09/15/2013   .  Hx of CABG  09/12/2013   .  Malnutrition of mild degree  09/03/2013   .  Respiratory failure, post-operative  08/29/2013   .  Coronary atherosclerosis of native coronary artery  08/16/2013       Class: Acute   .  Mitral regurgitation  08/07/2013   .  Pulmonary hypertension, moderate to severe  08/07/2013   .  Chronic diastolic heart failure  08/07/2013       Class: Chronic   .  Essential hypertension, benign  08/02/2013   .  Nontoxic uninodular goiter  08/02/2013   .  Unspecified hereditary and idiopathic peripheral neuropathy  08/02/2013   .  Occlusion and  stenosis of carotid artery without mention of cerebral infarction  08/02/2013   .  Unspecified vitamin D deficiency  08/02/2013   .  Depressive disorder, not elsewhere classified  08/02/2013   .  Paroxysmal atrial fibrillation  08/02/2013   .  Trifascicular block  08/02/2013   .  Gastric ulcer, unspecified as acute or chronic, without mention of hemorrhage, perforation, or obstruction  08/02/2013   .  Sinoatrial node dysfunction  08/02/2013   .  Other dyspnea and respiratory abnormality  08/02/2013   .  Encounter for long-term (current) use of other medications  08/02/2013   .  NONSPECIFIC ABNORMAL FIND RAD&OTH EXAM GI TRACT  05/30/2008     Expected Discharge Date: Expected Discharge Date: 01/03/14  Team Members Present: Physician leading conference: Dr. Faith [name] Social Worker Present: Maria Acosta, LCSW Nurse Present: Maria Bode, RN PT Present: Maria [name], PT OT Present: Maria [name], OT;Maria [name], Maria [name], OT PPS Coordinator present : Maria Duck, RN, CRRN        Current Status/Progress  Goal  Weekly Team Focus   Medical     right leg pain. improved bladder and bowel function. sodium stable  see prior,  finalize medical issues for discharge   Bowel/Bladder     cont of bladder, PVR's below 250cc for the last 48hrs, no I&O cath required; cont of bowel on bowel meds.  cont of bladder with no retention; cont of bowel with meds  continue PVR's, monitor for constipation, OOB for toileting   Swallow/Nutrition/ Hydration            ADL's     Supervision transfers, standing balance and seated upper body ADL, Min A lower body bathing and dressing (just right foot)   Supervision transfers and seated upper body ADL, Min A lower body dressing and standing balance  AE training, pain management, functional mobility, family training, dynamic standing balance, adherence to hip precautions   Mobility     supervision  supervision  family education, precautions, and safe  D/C home   Communication            Safety/Cognition/ Behavioral Observations           Pain     scheduled xanaflex; PRN tramadol and robaxin for mod-severe pain, tylenol for mild pain.  pain less than 3/10  assess pain qshift and prn, medicate as needed   Skin     incision healed, OTA; R heel DTI with allevyn, prafo boot at HS, specialty shoes during therapy  incision heals with no infection, no further skin breakdown  monitor incision and heel, relieve pressure as needed    Rehab Goals Patient on target to meet rehab goals: Yes Rehab Goals Revised: None *See Care Plan and progress notes for long and short-term goals.    Barriers to Discharge:  right leg pain      Possible Resolutions to Barriers:    pain control      Discharge Planning/Teaching Needs:    Pt is feeling more comfortable and confident with going to her home with some assistance from her husband and intermittent supervision from him and her children.  Pt feels she is "smart enough" to know when she needs help and will hire caregivers, if needed and can always go to the skilled unit at [facility] if she gets into trouble at home.   Husband has already begun family education.    Team Discussion:    MD feels pt has less hip pain and is doing better overall.  She still has spot on her heel, but this is stable.  Per OT, pt will need min assist with lower body tasks, but otherwise will be supervision.  Both PT and OT feel comfortable with pt having intermittent supervision from husband/family.   Revisions to Treatment Plan:    None    Continued Need for Acute Rehabilitation Level of Care: The patient requires daily medical management by a physician with specialized training in physical medicine and rehabilitation for the following conditions: Daily direction of a multidisciplinary physical rehabilitation program to ensure safe treatment while eliciting the highest outcome that is of practical value to the patient.:  Yes Daily analysis of laboratory values and/or radiology reports with any subsequent need for medication adjustment of medical intervention for : Post surgical problems;Cardiac problems  Maria [name], Maria Deck 01/03/2014, 9:06 AM

## 2014-01-04 NOTE — Progress Notes (Signed)
Social Work Patient ID: [name], female   DOB: Jun 15, 1924, 80 y.o.   MRN: [id number]  CSW met with pt and pt's husband on 01-01-14 after team conference to update them on discussion.  Team feels pt is doing better with pain in hip and her heel area is stable.  Pt is at supervision level with the exception of min assist with lower body dressing and bathing due to hip precautions.  Pt expressed understanding.  Therapists are comfortable with pt's husband leaving pt for an hour at a time to attend his regular meetings, as long as he gets pt set up in w/c prior to his departure.  Husband attended family education sessions this week and both he and pt expressed understanding in intermittent supervision and both feel comfortable and confident with pt returning to their home on 01-03-14 and no longer feel a temporary stay at skilled unit at [facility] is necessary.  Pt states that she has resources there and she is "smart enough" to know when they need help and she will ask.  CSW arranged HH for pt through [facility] and w/c and walker through [facility].  Pt and husband's questions were answered and pt expressed appreciation of so many staff members and her time on CIR.  CSW encouraged pt to call if they had any questions or concerns.

## 2014-01-04 NOTE — Discharge Summary (Signed)
Maria [name], Maria [name]             ACCOUNT NO.:  [id number]  MEDICAL RECORD NO.:  [id number]  LOCATION:  4W02C                        FACILITY:  [facility]  PHYSICIAN:  Meredith Staggers, M.D.DATE OF BIRTH:  10-28-1923  DATE OF ADMISSION:  12/12/2013 DATE OF DISCHARGE:  01/03/2014                              DISCHARGE SUMMARY   DISCHARGE DIAGNOSES: 1. Right femoral neck fracture status post hemiarthroplasty. 2. Xarelto for deep vein thrombosis prophylaxis. 3. Pain management. 4. Acute blood loss anemia. 5. Syndrome of inappropriate antidiuretic hormone. 6. Diastolic congestive heart failure. 7. Coronary artery disease with coronary artery bypass graft. 8. Atrial fibrillation. 9. Hypothyroidism. 10.Klebsiella E. coli urinary tract infection resolved. 11.Skin breakdown right heel.  HISTORY OF PRESENT ILLNESS:  This is an 80 year old right-handed female with history of CAD with bypass grafting, hypertension, diastolic congestive heart failure, DVT in 2014 as well as atrial fibrillation maintained on Xarelto and hyponatremia-SIADH, sodium 125-129.  The patient independent prior to admission living with her husband who is a retired Engineer, drilling.  Patient presented on December 08, 2013, after a fall when she slipped and fell on the hardwood floor, there was no loss of consciousness, landing on her right hip.  X-rays and imaging revealed right femoral neck fracture.  Preoperative hyponatremia 124 received intravenous fluids of normal saline.  Underwent right hip hemiarthroplasty on December 09, 2013, per Dr. Mayer Camel.  Weightbearing as tolerated with posterior hip precautions.  Xarelto resumed for history of atrial fibrillation, DVT.  Postoperative pain control.  Acute blood loss anemia 9.4 and monitored.  Follow up renal services for history of SIADH placed on low-dose Lasix.  Latest sodium 126-129 had been on a fluid restriction.  Urine study showed greater than 100,000 Klebsiella E. coli,  urinary tract infection maintained on Rocephin changed to Cipro.  Physical and occupational therapy on going.  The patient was admitted for comprehensive rehab program.  PAST MEDICAL HISTORY:  See discharge diagnoses.  SOCIAL HISTORY:  Lives with spouse who is a retired Engineer, drilling. Functional status prior to admission was independent.  Functional status upon admission to rehab services was ambulating 12 feet with a slowed gait velocity needing cues for sequencing, required physical assist advanced right lower extremity, +1 total assist lower body bathing, dressing.  PHYSICAL EXAMINATION:  VITAL SIGNS:  Blood pressure 132/52, pulse 70, temperature 97.9, respirations 16. GENERAL:  This was an alert female, oriented x3, follow commands, she was somewhat distracted. LUNGS:  Clear to auscultation. CARDIAC:  Rate controlled. ABDOMEN:  Soft, nontender.  Good bowel sounds. SKIN:  Hip incision clean and dry.  REHABILITATION HOSPITAL COURSE:  The patient was admitted to inpatient rehab services with therapies initiated on a 3-hour daily basis consisting of physical therapy, occupational therapy, and rehabilitation nursing.  The following issues were addressed during the patient's rehabilitation stay.  Pertaining to [name] right femoral neck fracture she had undergone hemiarthroplasty on December 09, 2013, per Dr. Mayer Camel.  She was weightbearing as tolerated with posterior hip precautions.  Neurovascular sensation intact.  She remained on Xarelto for history of DVT as well as atrial fibrillation with cardiac rate controlled.  Pain management with the use of Zanaflex as well as Lidoderm patch with  Robaxin for breakthrough muscle spasms, and Ultram for pain monitoring neurological status closely.  Noted acute blood loss anemia.  No bleeding episodes while on rehab services, latest hemoglobin of 9.8.  Noted history of SIADH.  She had been followed by renal services and maintained on a fluid  restriction of 1500 mL, it was later discontinued with latest sodium 134.  She had been on salt tablets, these had also been later discontinued.  She would follow up with renal services as needed.  She had completed a course of Cipro, changed to [place] for sensitivities of Klebsiella E. coli, urinary tract infection.  Denied any dysuria.  Bouts of constipation as she remained on MiraLAX as well as Linzess.  No nausea or vomiting.Skin breakdown right heel with prevalon boot in bed and darco boot right foot.Out patient follow up for wound care.  The patient received weekly collaborative interdisciplinary team conferences to discuss estimated length of stay, family teaching, and any barriers to discharge.  She was ambulating 120 feet rolling walker, standby assistance.  Following hip precautions as advised with some cueing.  Sit to stand from mat focusing on decrease use of upper extremities and eccentric control stand to sit.  Activities of daily living; focusing on improved attention and memory related to adherence of hip precautions. The patient completed assigned tasks to bathe in shower and dress sitting and standing with supervision required, again to adhere to hip precautions.  She continued to demonstrates an impaired memory required minimal verbal cues for these precautions.  Full family teaching was completed and plan was to be discharged to home.  DISCHARGE MEDICATIONS: 1. Lasix 20 mg p.o. daily. 2. Synthroid 50 mcg p.o. daily. 3. Lidoderm patch change every 12 hours. 4. Linzess 145 mcg p.o. daily. 5. Robaxin 500 mg p.o. every 6 hours as needed muscle spasms. 6. MiraLAX 17 g p.o. daily hold for loose stools. 7. Xarelto 20 mg p.o. daily. 8. Senokot-S tablets 2 p.o. b.i.d. 9. Zanaflex 2 mg p.o. b.i.d. and 4 mg p.o. at bedtime. 10.Ultram 50 mg p.o. every 6 hours as needed for moderate pain,     dispense of 90 tablets.  DIET:  Regular.  SPECIAL INSTRUCTIONS:  She would follow  up with Dr. Alger Simons the outpatient rehab service office as needed.  Dr. Frederik Pear orthopedic services 2 weeks call for appointment.  Dr. Roney Jaffe renal services as needed for history of SIADH.  Dr. [name] medical management appointment to be made.  The patient is maintained on gluten free diet.Follow up sodium level one week with PCP. She was weightbearing as tolerated with posterior hip precautions, right lower extremity with prevalon boot.  Ongoing therapies were arranged as per rehab services.     Lauraine Rinne, P.A.   ______________________________ Meredith Staggers, M.D.    DA/MEDQ  D:  01/02/2014  T:  01/03/2014  Job:  [id number]  cc:   Meredith Staggers, M.D. Kathalene Frames. Mayer Camel, M.D. Sol Blazing, M.D. Hal T. Stoneking, M.D.

## 2014-01-04 NOTE — Progress Notes (Signed)
Social Work Discharge Note  The overall goal for the admission was met for:   Discharge location: Yes - home  Length of Stay: Yes - 22 days  Discharge activity level: Yes - supervision  Home/community participation: Yes  Services provided included: MD, RD, PT, OT, RN, Pharmacy and [place]: Medicare and Private Insurance: [place]  Follow-up services arranged: Home Health: PT/OT, DME: rolling walker and W/C and Patient/Family request agency HH: [facility], DME: [place]  Comments (or additional information):  Patient/Family verbalized understanding of follow-up arrangements: Yes  Individual responsible for coordination of the follow-up plan: pt and her husband  Confirmed correct DME delivered: Trey Sailors 01/04/2014    Arelis Neumeier, Silvestre Mesi

## 2014-01-06 DIAGNOSIS — Z471 Aftercare following joint replacement surgery: Secondary | ICD-10-CM | POA: Diagnosis not present

## 2014-01-06 DIAGNOSIS — I10 Essential (primary) hypertension: Secondary | ICD-10-CM | POA: Diagnosis not present

## 2014-01-06 DIAGNOSIS — L89609 Pressure ulcer of unspecified heel, unspecified stage: Secondary | ICD-10-CM | POA: Diagnosis not present

## 2014-01-06 DIAGNOSIS — Z96649 Presence of unspecified artificial hip joint: Secondary | ICD-10-CM | POA: Diagnosis not present

## 2014-01-06 DIAGNOSIS — R269 Unspecified abnormalities of gait and mobility: Secondary | ICD-10-CM | POA: Diagnosis not present

## 2014-01-06 DIAGNOSIS — L8992 Pressure ulcer of unspecified site, stage 2: Secondary | ICD-10-CM | POA: Diagnosis not present

## 2014-01-07 ENCOUNTER — Ambulatory Visit (HOSPITAL_COMMUNITY): Payer: Medicare Other

## 2014-01-09 ENCOUNTER — Ambulatory Visit (HOSPITAL_COMMUNITY): Payer: Medicare Other

## 2014-01-09 DIAGNOSIS — R269 Unspecified abnormalities of gait and mobility: Secondary | ICD-10-CM | POA: Diagnosis not present

## 2014-01-09 DIAGNOSIS — L8992 Pressure ulcer of unspecified site, stage 2: Secondary | ICD-10-CM | POA: Diagnosis not present

## 2014-01-09 DIAGNOSIS — I10 Essential (primary) hypertension: Secondary | ICD-10-CM | POA: Diagnosis not present

## 2014-01-09 DIAGNOSIS — Z471 Aftercare following joint replacement surgery: Secondary | ICD-10-CM | POA: Diagnosis not present

## 2014-01-09 DIAGNOSIS — Z96649 Presence of unspecified artificial hip joint: Secondary | ICD-10-CM | POA: Diagnosis not present

## 2014-01-09 DIAGNOSIS — L89609 Pressure ulcer of unspecified heel, unspecified stage: Secondary | ICD-10-CM | POA: Diagnosis not present

## 2014-01-10 ENCOUNTER — Ambulatory Visit: Payer: Medicare Other | Admitting: Cardiothoracic Surgery

## 2014-01-10 DIAGNOSIS — S72033A Displaced midcervical fracture of unspecified femur, initial encounter for closed fracture: Secondary | ICD-10-CM | POA: Diagnosis not present

## 2014-01-11 ENCOUNTER — Ambulatory Visit (HOSPITAL_COMMUNITY): Payer: Medicare Other

## 2014-01-11 DIAGNOSIS — L8992 Pressure ulcer of unspecified site, stage 2: Secondary | ICD-10-CM | POA: Diagnosis not present

## 2014-01-11 DIAGNOSIS — R269 Unspecified abnormalities of gait and mobility: Secondary | ICD-10-CM | POA: Diagnosis not present

## 2014-01-11 DIAGNOSIS — Z96649 Presence of unspecified artificial hip joint: Secondary | ICD-10-CM | POA: Diagnosis not present

## 2014-01-11 DIAGNOSIS — I10 Essential (primary) hypertension: Secondary | ICD-10-CM | POA: Diagnosis not present

## 2014-01-11 DIAGNOSIS — L89609 Pressure ulcer of unspecified heel, unspecified stage: Secondary | ICD-10-CM | POA: Diagnosis not present

## 2014-01-11 DIAGNOSIS — Z471 Aftercare following joint replacement surgery: Secondary | ICD-10-CM | POA: Diagnosis not present

## 2014-01-14 ENCOUNTER — Telehealth: Payer: Self-pay

## 2014-01-14 DIAGNOSIS — L8990 Pressure ulcer of unspecified site, unspecified stage: Secondary | ICD-10-CM | POA: Diagnosis not present

## 2014-01-14 DIAGNOSIS — I495 Sick sinus syndrome: Secondary | ICD-10-CM | POA: Diagnosis not present

## 2014-01-14 DIAGNOSIS — E236 Other disorders of pituitary gland: Secondary | ICD-10-CM | POA: Diagnosis not present

## 2014-01-14 DIAGNOSIS — I10 Essential (primary) hypertension: Secondary | ICD-10-CM | POA: Diagnosis not present

## 2014-01-14 DIAGNOSIS — D649 Anemia, unspecified: Secondary | ICD-10-CM | POA: Diagnosis not present

## 2014-01-14 NOTE — Telephone Encounter (Signed)
Lelan Pons (PT @ [facility]) is requesting a verbal order to begin PT with patient 1x a week for 1 week, 2x a week for 2 weeks, and 3x a week for 3 weeks. Is this okay?

## 2014-01-14 NOTE — Telephone Encounter (Signed)
Yes, that would be fine.

## 2014-01-14 NOTE — Telephone Encounter (Signed)
Attempted to contact [facility], left a message on a verified voicemail. Gave her the verbal okay to extend patient's PT per Dr. Naaman Plummer.

## 2014-01-15 DIAGNOSIS — I10 Essential (primary) hypertension: Secondary | ICD-10-CM | POA: Diagnosis not present

## 2014-01-15 DIAGNOSIS — Z96649 Presence of unspecified artificial hip joint: Secondary | ICD-10-CM | POA: Diagnosis not present

## 2014-01-15 DIAGNOSIS — L8992 Pressure ulcer of unspecified site, stage 2: Secondary | ICD-10-CM | POA: Diagnosis not present

## 2014-01-15 DIAGNOSIS — Z471 Aftercare following joint replacement surgery: Secondary | ICD-10-CM | POA: Diagnosis not present

## 2014-01-15 DIAGNOSIS — L89609 Pressure ulcer of unspecified heel, unspecified stage: Secondary | ICD-10-CM | POA: Diagnosis not present

## 2014-01-15 DIAGNOSIS — R269 Unspecified abnormalities of gait and mobility: Secondary | ICD-10-CM | POA: Diagnosis not present

## 2014-01-17 DIAGNOSIS — Z96649 Presence of unspecified artificial hip joint: Secondary | ICD-10-CM | POA: Diagnosis not present

## 2014-01-17 DIAGNOSIS — R269 Unspecified abnormalities of gait and mobility: Secondary | ICD-10-CM | POA: Diagnosis not present

## 2014-01-17 DIAGNOSIS — Z471 Aftercare following joint replacement surgery: Secondary | ICD-10-CM | POA: Diagnosis not present

## 2014-01-17 DIAGNOSIS — L89609 Pressure ulcer of unspecified heel, unspecified stage: Secondary | ICD-10-CM | POA: Diagnosis not present

## 2014-01-17 DIAGNOSIS — L8992 Pressure ulcer of unspecified site, stage 2: Secondary | ICD-10-CM | POA: Diagnosis not present

## 2014-01-17 DIAGNOSIS — I10 Essential (primary) hypertension: Secondary | ICD-10-CM | POA: Diagnosis not present

## 2014-01-18 DIAGNOSIS — L89609 Pressure ulcer of unspecified heel, unspecified stage: Secondary | ICD-10-CM | POA: Diagnosis not present

## 2014-01-18 DIAGNOSIS — L8992 Pressure ulcer of unspecified site, stage 2: Secondary | ICD-10-CM | POA: Diagnosis not present

## 2014-01-18 DIAGNOSIS — R269 Unspecified abnormalities of gait and mobility: Secondary | ICD-10-CM | POA: Diagnosis not present

## 2014-01-18 DIAGNOSIS — Z471 Aftercare following joint replacement surgery: Secondary | ICD-10-CM | POA: Diagnosis not present

## 2014-01-18 DIAGNOSIS — I10 Essential (primary) hypertension: Secondary | ICD-10-CM | POA: Diagnosis not present

## 2014-01-18 DIAGNOSIS — Z96649 Presence of unspecified artificial hip joint: Secondary | ICD-10-CM | POA: Diagnosis not present

## 2014-01-21 DIAGNOSIS — Z471 Aftercare following joint replacement surgery: Secondary | ICD-10-CM | POA: Diagnosis not present

## 2014-01-21 DIAGNOSIS — R269 Unspecified abnormalities of gait and mobility: Secondary | ICD-10-CM | POA: Diagnosis not present

## 2014-01-21 DIAGNOSIS — Z96649 Presence of unspecified artificial hip joint: Secondary | ICD-10-CM | POA: Diagnosis not present

## 2014-01-21 DIAGNOSIS — L89609 Pressure ulcer of unspecified heel, unspecified stage: Secondary | ICD-10-CM | POA: Diagnosis not present

## 2014-01-21 DIAGNOSIS — I10 Essential (primary) hypertension: Secondary | ICD-10-CM | POA: Diagnosis not present

## 2014-01-21 DIAGNOSIS — L8992 Pressure ulcer of unspecified site, stage 2: Secondary | ICD-10-CM | POA: Diagnosis not present

## 2014-01-22 DIAGNOSIS — I10 Essential (primary) hypertension: Secondary | ICD-10-CM | POA: Diagnosis not present

## 2014-01-22 DIAGNOSIS — Z471 Aftercare following joint replacement surgery: Secondary | ICD-10-CM | POA: Diagnosis not present

## 2014-01-22 DIAGNOSIS — L89609 Pressure ulcer of unspecified heel, unspecified stage: Secondary | ICD-10-CM | POA: Diagnosis not present

## 2014-01-22 DIAGNOSIS — L8992 Pressure ulcer of unspecified site, stage 2: Secondary | ICD-10-CM | POA: Diagnosis not present

## 2014-01-22 DIAGNOSIS — Z96649 Presence of unspecified artificial hip joint: Secondary | ICD-10-CM | POA: Diagnosis not present

## 2014-01-22 DIAGNOSIS — R269 Unspecified abnormalities of gait and mobility: Secondary | ICD-10-CM | POA: Diagnosis not present

## 2014-01-23 ENCOUNTER — Ambulatory Visit: Payer: Medicare Other | Admitting: Interventional Cardiology

## 2014-01-23 DIAGNOSIS — R269 Unspecified abnormalities of gait and mobility: Secondary | ICD-10-CM | POA: Diagnosis not present

## 2014-01-23 DIAGNOSIS — Z96649 Presence of unspecified artificial hip joint: Secondary | ICD-10-CM | POA: Diagnosis not present

## 2014-01-23 DIAGNOSIS — Z471 Aftercare following joint replacement surgery: Secondary | ICD-10-CM | POA: Diagnosis not present

## 2014-01-23 DIAGNOSIS — L89609 Pressure ulcer of unspecified heel, unspecified stage: Secondary | ICD-10-CM | POA: Diagnosis not present

## 2014-01-23 DIAGNOSIS — L8992 Pressure ulcer of unspecified site, stage 2: Secondary | ICD-10-CM | POA: Diagnosis not present

## 2014-01-23 DIAGNOSIS — I10 Essential (primary) hypertension: Secondary | ICD-10-CM | POA: Diagnosis not present

## 2014-01-24 ENCOUNTER — Encounter (HOSPITAL_BASED_OUTPATIENT_CLINIC_OR_DEPARTMENT_OTHER): Payer: Medicare Other | Attending: Internal Medicine

## 2014-01-24 ENCOUNTER — Ambulatory Visit (INDEPENDENT_AMBULATORY_CARE_PROVIDER_SITE_OTHER): Payer: Medicare Other | Admitting: Cardiothoracic Surgery

## 2014-01-24 ENCOUNTER — Encounter: Payer: Self-pay | Admitting: Cardiothoracic Surgery

## 2014-01-24 VITALS — BP 146/68 | HR 80 | Resp 20 | Ht 61.0 in | Wt 103.0 lb

## 2014-01-24 DIAGNOSIS — I2510 Atherosclerotic heart disease of native coronary artery without angina pectoris: Secondary | ICD-10-CM | POA: Diagnosis not present

## 2014-01-24 DIAGNOSIS — Z96649 Presence of unspecified artificial hip joint: Secondary | ICD-10-CM | POA: Diagnosis not present

## 2014-01-24 DIAGNOSIS — I10 Essential (primary) hypertension: Secondary | ICD-10-CM | POA: Diagnosis not present

## 2014-01-24 DIAGNOSIS — Z951 Presence of aortocoronary bypass graft: Secondary | ICD-10-CM

## 2014-01-24 DIAGNOSIS — R269 Unspecified abnormalities of gait and mobility: Secondary | ICD-10-CM | POA: Diagnosis not present

## 2014-01-24 DIAGNOSIS — Z8709 Personal history of other diseases of the respiratory system: Secondary | ICD-10-CM

## 2014-01-24 DIAGNOSIS — Z471 Aftercare following joint replacement surgery: Secondary | ICD-10-CM | POA: Diagnosis not present

## 2014-01-24 DIAGNOSIS — L89609 Pressure ulcer of unspecified heel, unspecified stage: Secondary | ICD-10-CM | POA: Diagnosis not present

## 2014-01-24 DIAGNOSIS — L8992 Pressure ulcer of unspecified site, stage 2: Secondary | ICD-10-CM | POA: Diagnosis not present

## 2014-01-24 NOTE — Progress Notes (Signed)
[address]       [place] [address]             [phone]           [address]       [place] [address]             [phone]                        ZYMIRAH TRUPP [facility] Medical Record #[id number] Date of Birth: [birth year]  Merlene Laughter, MD Ginette Otto, MD  Chief Complaint:   PostOp Follow Up Visit 08/28/2013  PREOPERATIVE DIAGNOSES: Severe 2-vessel coronary artery disease with  unstable angina, history of atrial fibrillation, history of aortic  insufficiency, and mitral insufficiency.  POSTOPERATIVE DIAGNOSES: Severe 2-vessel coronary artery disease with  unstable angina, history of atrial fibrillation, history of aortic  insufficiency, and mitral insufficiency.  SURGICAL PROCEDURE: Coronary artery bypass grafting x2 with left  internal mammary to the left anterior descending coronary artery, and  reverse saphenous vein graft to the circumflex coronary artery, and  placement of a Accucare 40 mm left atrial clip with right leg endo vein  harvesting.  SURGEON: Sheliah Plane, MD   History of Present Illness:     Patient returns to the office today after coronary artery bypass grafting with atrial clip in anterior done 5 month ago. She developed increasing pleural effusions postoperatively requiring readmission and bilateral thoracentesis. S Her respiratory status has been stable. She notes that the last several weeks she's been very limited by nausea and very poor appetite. She was noted in cardiology office yesterday to have a low heart rate, because of this and the nausea her amiodarone has been stopped. Followup echo shows evidence of preserved LV function, moderate TR and MR and evidence of pulmonary hypertension.  Patient returns today with a followup chest x-ray, which is much improved.  Her nausea has improved, she still complains of discomfort in her lower legs especially the right. She  remains on Zarleto for DVT  Since last seen she fell and broke her right hip requiring hip replacement, during that hospitalization developed a pressure ulcer on the posterior aspect of her right heel From a cardiac standpoint she has tolerated this well, she denies any angina, she has no overt symptoms of congestive heart failure at this point,   History  Smoking status  . Never Smoker   Smokeless tobacco  . Never Used       Allergies  Allergen Reactions  . Amoxicillin Other (See Comments)    Nausea, dizziness  . Codeine Nausea And Vomiting  . Fosamax [Alendronate Sodium] Other (See Comments)    Jaw pain  . Gluten Meal Other (See Comments)    Celiac Disease  . Hctz [Hydrochlorothiazide] Other (See Comments)    Hyponatremia, GI upset  . Tetanus Toxoids Other (See Comments)    Bad local reaction    Current Outpatient Prescriptions  Medication Sig Dispense Refill  . acetaminophen (TYLENOL) 325 MG tablet Take 650 mg by mouth every 6 (six) hours as needed.      . furosemide (LASIX) 20 MG tablet Take 1 tablet (20 mg total) by mouth daily.  30 tablet  1  . levothyroxine (SYNTHROID, LEVOTHROID) 50 MCG tablet Take 1 tablet (50 mcg total) by mouth daily before breakfast.  30 tablet  11  . methocarbamol (ROBAXIN) 500 MG tablet Take  1 tablet (500 mg total) by mouth every 6 (six) hours as needed for muscle spasms.  60 tablet  0  . MULTIPLE VITAMIN PO Take 1 tablet by mouth daily.      Marland Kitchen omeprazole (PRILOSEC) 20 MG capsule Take 1 capsule (20 mg total) by mouth every other day.  30 capsule  1  . Rivaroxaban (XARELTO) 20 MG TABS tablet Take 1 tablet (20 mg total) by mouth daily with supper. Start January 3rd.  30 tablet  5  . tiZANidine (ZANAFLEX) 4 MG tablet Take 1 tablet (4 mg total) by mouth at bedtime.  30 tablet  0  . traMADol (ULTRAM) 50 MG tablet Take 1 tablet (50 mg total) by mouth every 6 (six) hours as needed for moderate pain.  60 tablet  0   No current facility-administered  medications for this visit.       Physical Exam: BP 146/68  Pulse 80  Resp 20  Ht 5\' 1"  (1.549 m)  Wt 103 lb (46.72 kg)  BMI 19.47 kg/m2  SpO2 98% Pulses regular 74 General appearance: alert and cooperative Neurologic: intact Heart: regular rate and rhythm, S1, S2 normal, no murmur, click, rub or gallop, I do not appreciate murmur of mitral insufficiency but it is noted on recent echo Lungs:  breath sounds improved  Abdomen: soft, non-tender; bowel sounds normal; no masses,  no organomegaly Extremities: extremities normal, atraumatic, no cyanosis  and Homans sign is negative, she has a area of eschar on the posterior right heel with moderate pedal edema, there is no edema in the left ankle Wound: Sternal incision is stable and well healed    Diagnostic Studies & Laboratory data:         Recent Radiology Findings: No results found.      Recent Labs: Lab Results  Component Value Date   WBC 5.7 12/28/2013   HGB 9.8* 12/28/2013   HCT 30.2* 12/28/2013   PLT 462* 12/28/2013   GLUCOSE 96 01/02/2014   CHOL 135 10/04/2013   TRIG 102 10/04/2013   HDL 36* 10/04/2013   LDLCALC 79 10/04/2013   ALT 89* 12/18/2013   AST 74* 12/18/2013   NA 135* 01/02/2014   K 4.2 01/02/2014   CL 99 01/02/2014   CREATININE 0.58 01/02/2014   BUN 8 01/02/2014   CO2 24 01/02/2014   TSH 2.175 12/10/2013   INR 1.42 12/07/2013   HGBA1C 5.7* 10/03/2013      Assessment / Plan:    Patient appears improved since I saw her, but now has additional problems including recovering from her hip replacement and ulcer on the right posterior heel From a cardiac standpoint she's appears to be stable Have not made a return appointment for her but would be glad to see her her or cardiology request at any time.    Stephfon Bovey B 01/24/2014 5:23 PM

## 2014-01-25 DIAGNOSIS — R269 Unspecified abnormalities of gait and mobility: Secondary | ICD-10-CM | POA: Diagnosis not present

## 2014-01-25 DIAGNOSIS — Z96649 Presence of unspecified artificial hip joint: Secondary | ICD-10-CM | POA: Diagnosis not present

## 2014-01-25 DIAGNOSIS — L8992 Pressure ulcer of unspecified site, stage 2: Secondary | ICD-10-CM | POA: Diagnosis not present

## 2014-01-25 DIAGNOSIS — I10 Essential (primary) hypertension: Secondary | ICD-10-CM | POA: Diagnosis not present

## 2014-01-25 DIAGNOSIS — Z471 Aftercare following joint replacement surgery: Secondary | ICD-10-CM | POA: Diagnosis not present

## 2014-01-25 DIAGNOSIS — L89609 Pressure ulcer of unspecified heel, unspecified stage: Secondary | ICD-10-CM | POA: Diagnosis not present

## 2014-01-28 DIAGNOSIS — R269 Unspecified abnormalities of gait and mobility: Secondary | ICD-10-CM | POA: Diagnosis not present

## 2014-01-28 DIAGNOSIS — I10 Essential (primary) hypertension: Secondary | ICD-10-CM | POA: Diagnosis not present

## 2014-01-28 DIAGNOSIS — L8992 Pressure ulcer of unspecified site, stage 2: Secondary | ICD-10-CM | POA: Diagnosis not present

## 2014-01-28 DIAGNOSIS — Z96649 Presence of unspecified artificial hip joint: Secondary | ICD-10-CM | POA: Diagnosis not present

## 2014-01-28 DIAGNOSIS — Z471 Aftercare following joint replacement surgery: Secondary | ICD-10-CM | POA: Diagnosis not present

## 2014-01-28 DIAGNOSIS — L89609 Pressure ulcer of unspecified heel, unspecified stage: Secondary | ICD-10-CM | POA: Diagnosis not present

## 2014-01-30 DIAGNOSIS — I10 Essential (primary) hypertension: Secondary | ICD-10-CM | POA: Diagnosis not present

## 2014-01-30 DIAGNOSIS — L89609 Pressure ulcer of unspecified heel, unspecified stage: Secondary | ICD-10-CM | POA: Diagnosis not present

## 2014-01-30 DIAGNOSIS — Z471 Aftercare following joint replacement surgery: Secondary | ICD-10-CM | POA: Diagnosis not present

## 2014-01-30 DIAGNOSIS — R269 Unspecified abnormalities of gait and mobility: Secondary | ICD-10-CM | POA: Diagnosis not present

## 2014-01-30 DIAGNOSIS — L8992 Pressure ulcer of unspecified site, stage 2: Secondary | ICD-10-CM | POA: Diagnosis not present

## 2014-01-30 DIAGNOSIS — Z96649 Presence of unspecified artificial hip joint: Secondary | ICD-10-CM | POA: Diagnosis not present

## 2014-02-01 DIAGNOSIS — Z471 Aftercare following joint replacement surgery: Secondary | ICD-10-CM | POA: Diagnosis not present

## 2014-02-01 DIAGNOSIS — Z96649 Presence of unspecified artificial hip joint: Secondary | ICD-10-CM | POA: Diagnosis not present

## 2014-02-01 DIAGNOSIS — I10 Essential (primary) hypertension: Secondary | ICD-10-CM | POA: Diagnosis not present

## 2014-02-01 DIAGNOSIS — L8992 Pressure ulcer of unspecified site, stage 2: Secondary | ICD-10-CM | POA: Diagnosis not present

## 2014-02-01 DIAGNOSIS — L89609 Pressure ulcer of unspecified heel, unspecified stage: Secondary | ICD-10-CM | POA: Diagnosis not present

## 2014-02-01 DIAGNOSIS — R269 Unspecified abnormalities of gait and mobility: Secondary | ICD-10-CM | POA: Diagnosis not present

## 2014-02-04 ENCOUNTER — Encounter: Payer: Self-pay | Admitting: Interventional Cardiology

## 2014-02-04 ENCOUNTER — Ambulatory Visit (INDEPENDENT_AMBULATORY_CARE_PROVIDER_SITE_OTHER): Payer: Medicare Other | Admitting: Interventional Cardiology

## 2014-02-04 VITALS — BP 138/70 | HR 84 | Ht 61.0 in | Wt 103.0 lb

## 2014-02-04 DIAGNOSIS — L8992 Pressure ulcer of unspecified site, stage 2: Secondary | ICD-10-CM | POA: Diagnosis not present

## 2014-02-04 DIAGNOSIS — Z7901 Long term (current) use of anticoagulants: Secondary | ICD-10-CM

## 2014-02-04 DIAGNOSIS — I10 Essential (primary) hypertension: Secondary | ICD-10-CM | POA: Diagnosis not present

## 2014-02-04 DIAGNOSIS — I5032 Chronic diastolic (congestive) heart failure: Secondary | ICD-10-CM | POA: Diagnosis not present

## 2014-02-04 DIAGNOSIS — Z5181 Encounter for therapeutic drug level monitoring: Secondary | ICD-10-CM | POA: Diagnosis not present

## 2014-02-04 DIAGNOSIS — L89609 Pressure ulcer of unspecified heel, unspecified stage: Secondary | ICD-10-CM | POA: Diagnosis not present

## 2014-02-04 DIAGNOSIS — I2510 Atherosclerotic heart disease of native coronary artery without angina pectoris: Secondary | ICD-10-CM | POA: Diagnosis not present

## 2014-02-04 DIAGNOSIS — I059 Rheumatic mitral valve disease, unspecified: Secondary | ICD-10-CM

## 2014-02-04 DIAGNOSIS — I4891 Unspecified atrial fibrillation: Secondary | ICD-10-CM

## 2014-02-04 DIAGNOSIS — Z96649 Presence of unspecified artificial hip joint: Secondary | ICD-10-CM | POA: Diagnosis not present

## 2014-02-04 DIAGNOSIS — R269 Unspecified abnormalities of gait and mobility: Secondary | ICD-10-CM | POA: Diagnosis not present

## 2014-02-04 DIAGNOSIS — I272 Pulmonary hypertension, unspecified: Secondary | ICD-10-CM

## 2014-02-04 DIAGNOSIS — I2789 Other specified pulmonary heart diseases: Secondary | ICD-10-CM

## 2014-02-04 DIAGNOSIS — Z471 Aftercare following joint replacement surgery: Secondary | ICD-10-CM | POA: Diagnosis not present

## 2014-02-04 NOTE — Patient Instructions (Signed)
Your physician recommends that you continue on your current medications as directed. Please refer to the Current Medication list given to you today.  Your physician wants you to follow-up in: 3 months with Dr. [name]. You will receive a reminder letter in the mail two months in advance. If you don't receive a letter, please call our office to schedule the follow-up appointment.

## 2014-02-04 NOTE — Progress Notes (Signed)
Patient ID: Maria [name], female   DOB: 1924/07/30, 80 y.o.   MRN: [id number]    [address] [place], [place]  [address] Phone: [phone] Fax:  [phone]  Date:  02/04/2014   ID:  Maria [name], DOB August 20, 1924, MRN [id number]  PCP:  Mathews Argyle, MD   ASSESSMENT:  1. Chronic diastolic heart failure, stable, with apparent severe bulimia 2. Paroxysmal atrial fibrillation, clinically in normal sinus rhythm 3. Anticoagulation therapy with no complications. Anticoagulation is for both A. fib and DVT 4. Coronary atherosclerosis stable status post coronary bypass grafting  PLAN:  1. No change in clinical therapy at this time. Consider discontinuing anticoagulation on return visit 2. Clinical visit in 3 months 3. Call if lightheadedness, dizziness, dyspnea, rapid heart rate, or other CV complaining   SUBJECTIVE: Maria [name] is a 80 y.o. female who is doing slowly better. She has undergone coronary artery bypass grafting, DVT, hip fracture surgery, and is now slowly improving. Appetite is improving. She denies orthopnea, PND, and dyspnea. No angina. No episodes of syncope or falls since being at home and in rehabilitation. She does have a right heel ulcer that is slowly healing   Wt Readings from Last 3 Encounters:  02/04/14 103 lb (46.72 kg)  01/24/14 103 lb (46.72 kg)  12/26/13 107 lb 11.2 oz (48.852 kg)     Past Medical History  Diagnosis Date  . Osteoporosis   . Celiac disease   . Celiac disease   . Carotid artery occlusion     right carotid stenosis 40-60%, 4/08, neg for stenosis repeat study 11/11  . Depression   . Presbycusis   . Macular degeneration   . Urethral stricture   . Moderate aortic insufficiency   . Moderate mitral regurgitation by prior echocardiogram     echo 9/13   . Thyroid nodule     22m, no change 11/11  . RLS (restless legs syndrome)   . Vitamin D deficiency   . Heart murmur   . Pulmonary hypertension   .  Hypertension     takes Metoprolol daily  . Atrial fibrillation   . Coronary artery disease   . CHF (congestive heart failure)     takes Lasix daily  . MVP (mitral valve prolapse)   . Moderate obstructive sleep apnea     uses CPAP;sleep study about 4-526monthago  . Hearing loss     both ears  . Numbness     left arm  . Joint swelling   . GERD (gastroesophageal reflux disease)     takes Omeprazole daily  . Gastric ulcer     6-28m52monthgo  . GI bleeding   . Constipation   . Diarrhea   . Hemorrhoids   . Urinary retention   . Urinary anastomotic stricture   . Hypothyroidism     takes Synthroid daily as result of AMiodarone  . Hot flashes     takes ZOloft 3 times a week  . Insomnia     takes Melatonin nightly  . Restless leg   . Peripheral edema     left  . DVT (deep venous thrombosis)   . Atonic bladder 12/11/2013    Current Outpatient Prescriptions  Medication Sig Dispense Refill  . acetaminophen (TYLENOL) 325 MG tablet Take 650 mg by mouth every 6 (six) hours as needed.      . doxepin (SINEQUAN) 10 MG capsule Take 10 mg by mouth at bedtime as needed.      .Marland Kitchen  furosemide (LASIX) 20 MG tablet Take 1 tablet (20 mg total) by mouth daily.  30 tablet  1  . levothyroxine (SYNTHROID, LEVOTHROID) 50 MCG tablet Take 1 tablet (50 mcg total) by mouth daily before breakfast.  30 tablet  11  . methocarbamol (ROBAXIN) 500 MG tablet Take 1 tablet (500 mg total) by mouth every 6 (six) hours as needed for muscle spasms.  60 tablet  0  . MULTIPLE VITAMIN PO Take 1 tablet by mouth daily.      Marland Kitchen omeprazole (PRILOSEC) 20 MG capsule Take 1 capsule (20 mg total) by mouth every other day.  30 capsule  1  . Rivaroxaban (XARELTO) 20 MG TABS tablet Take 1 tablet (20 mg total) by mouth daily with supper. Start January 3rd.  30 tablet  5  . traMADol (ULTRAM) 50 MG tablet Take 1 tablet (50 mg total) by mouth every 6 (six) hours as needed for moderate pain.  60 tablet  0   No current facility-administered  medications for this visit.    Allergies:    Allergies  Allergen Reactions  . Amoxicillin Other (See Comments)    Nausea, dizziness  . Codeine Nausea And Vomiting  . Fosamax [Alendronate Sodium] Other (See Comments)    Jaw pain  . Gluten Meal Other (See Comments)    Celiac Disease  . Hctz [Hydrochlorothiazide] Other (See Comments)    Hyponatremia, GI upset  . Tetanus Toxoids Other (See Comments)    Bad local reaction    Social History:  The patient  reports that she has never smoked. She has never used smokeless tobacco. She reports that she does not drink alcohol or use illicit drugs.   ROS:  Please see the history of present illness.   Denies chills or fever. Right greater than left lower extremity edema.   All other systems reviewed and negative.   OBJECTIVE: VS:  BP 138/70  Pulse 84  Ht 5' 1"  (1.549 m)  Wt 103 lb (46.72 kg)  BMI 19.47 kg/m2 Well nourished, well developed, in no acute distress, elderly and frail HEENT: normal Neck: JVD flat. Carotid bruit absent  Cardiac:  normal S1, S2; RRR; 2/6 systolic murmur Lungs:  clear to auscultation bilaterally, no wheezing, rhonchi or rales Abd: soft, nontender, no hepatomegaly Ext: Edema 1-2+ right lower extremity 0 to trace left lower extremity. Pulses trace bilateral Skin: warm and dry Neuro:  CNs 2-12 intact, no focal abnormalities noted  EKG:  Not repeated       Signed, Illene Labrador III, MD 02/04/2014 2:01 PM

## 2014-02-06 DIAGNOSIS — R269 Unspecified abnormalities of gait and mobility: Secondary | ICD-10-CM | POA: Diagnosis not present

## 2014-02-06 DIAGNOSIS — Z96649 Presence of unspecified artificial hip joint: Secondary | ICD-10-CM | POA: Diagnosis not present

## 2014-02-06 DIAGNOSIS — L8992 Pressure ulcer of unspecified site, stage 2: Secondary | ICD-10-CM | POA: Diagnosis not present

## 2014-02-06 DIAGNOSIS — L89609 Pressure ulcer of unspecified heel, unspecified stage: Secondary | ICD-10-CM | POA: Diagnosis not present

## 2014-02-06 DIAGNOSIS — Z471 Aftercare following joint replacement surgery: Secondary | ICD-10-CM | POA: Diagnosis not present

## 2014-02-06 DIAGNOSIS — I10 Essential (primary) hypertension: Secondary | ICD-10-CM | POA: Diagnosis not present

## 2014-02-08 DIAGNOSIS — Z471 Aftercare following joint replacement surgery: Secondary | ICD-10-CM | POA: Diagnosis not present

## 2014-02-08 DIAGNOSIS — I10 Essential (primary) hypertension: Secondary | ICD-10-CM | POA: Diagnosis not present

## 2014-02-08 DIAGNOSIS — Z96649 Presence of unspecified artificial hip joint: Secondary | ICD-10-CM | POA: Diagnosis not present

## 2014-02-08 DIAGNOSIS — L8992 Pressure ulcer of unspecified site, stage 2: Secondary | ICD-10-CM | POA: Diagnosis not present

## 2014-02-08 DIAGNOSIS — L89609 Pressure ulcer of unspecified heel, unspecified stage: Secondary | ICD-10-CM | POA: Diagnosis not present

## 2014-02-08 DIAGNOSIS — R269 Unspecified abnormalities of gait and mobility: Secondary | ICD-10-CM | POA: Diagnosis not present

## 2014-02-11 DIAGNOSIS — L8990 Pressure ulcer of unspecified site, unspecified stage: Secondary | ICD-10-CM | POA: Diagnosis not present

## 2014-02-13 DIAGNOSIS — L89609 Pressure ulcer of unspecified heel, unspecified stage: Secondary | ICD-10-CM | POA: Diagnosis not present

## 2014-03-01 ENCOUNTER — Inpatient Hospital Stay: Payer: Medicare Other | Admitting: Physical Medicine & Rehabilitation

## 2014-03-04 DIAGNOSIS — K5900 Constipation, unspecified: Secondary | ICD-10-CM | POA: Diagnosis not present

## 2014-03-04 DIAGNOSIS — R0609 Other forms of dyspnea: Secondary | ICD-10-CM | POA: Diagnosis not present

## 2014-03-04 DIAGNOSIS — R0602 Shortness of breath: Secondary | ICD-10-CM | POA: Diagnosis not present

## 2014-03-04 DIAGNOSIS — I5032 Chronic diastolic (congestive) heart failure: Secondary | ICD-10-CM | POA: Diagnosis not present

## 2014-03-04 DIAGNOSIS — L8990 Pressure ulcer of unspecified site, unspecified stage: Secondary | ICD-10-CM | POA: Diagnosis not present

## 2014-03-04 DIAGNOSIS — R0989 Other specified symptoms and signs involving the circulatory and respiratory systems: Secondary | ICD-10-CM | POA: Diagnosis not present

## 2014-03-04 DIAGNOSIS — R609 Edema, unspecified: Secondary | ICD-10-CM | POA: Diagnosis not present

## 2014-03-04 DIAGNOSIS — G479 Sleep disorder, unspecified: Secondary | ICD-10-CM | POA: Diagnosis not present

## 2014-03-04 DIAGNOSIS — E236 Other disorders of pituitary gland: Secondary | ICD-10-CM | POA: Diagnosis not present

## 2014-03-05 DIAGNOSIS — M25559 Pain in unspecified hip: Secondary | ICD-10-CM | POA: Diagnosis not present

## 2014-03-07 DIAGNOSIS — M25559 Pain in unspecified hip: Secondary | ICD-10-CM | POA: Diagnosis not present

## 2014-03-11 DIAGNOSIS — M25559 Pain in unspecified hip: Secondary | ICD-10-CM | POA: Diagnosis not present

## 2014-03-14 DIAGNOSIS — H04129 Dry eye syndrome of unspecified lacrimal gland: Secondary | ICD-10-CM | POA: Diagnosis not present

## 2014-03-14 DIAGNOSIS — H43819 Vitreous degeneration, unspecified eye: Secondary | ICD-10-CM | POA: Diagnosis not present

## 2014-03-14 DIAGNOSIS — H16109 Unspecified superficial keratitis, unspecified eye: Secondary | ICD-10-CM | POA: Diagnosis not present

## 2014-03-14 DIAGNOSIS — M25559 Pain in unspecified hip: Secondary | ICD-10-CM | POA: Diagnosis not present

## 2014-03-14 DIAGNOSIS — Z961 Presence of intraocular lens: Secondary | ICD-10-CM | POA: Diagnosis not present

## 2014-03-22 DIAGNOSIS — N390 Urinary tract infection, site not specified: Secondary | ICD-10-CM | POA: Diagnosis not present

## 2014-03-22 DIAGNOSIS — R300 Dysuria: Secondary | ICD-10-CM | POA: Diagnosis not present

## 2014-03-26 DIAGNOSIS — R300 Dysuria: Secondary | ICD-10-CM | POA: Diagnosis not present

## 2014-03-26 DIAGNOSIS — M25559 Pain in unspecified hip: Secondary | ICD-10-CM | POA: Diagnosis not present

## 2014-03-28 DIAGNOSIS — M25559 Pain in unspecified hip: Secondary | ICD-10-CM | POA: Diagnosis not present

## 2014-03-29 DIAGNOSIS — M25579 Pain in unspecified ankle and joints of unspecified foot: Secondary | ICD-10-CM | POA: Diagnosis not present

## 2014-04-01 DIAGNOSIS — D649 Anemia, unspecified: Secondary | ICD-10-CM | POA: Diagnosis not present

## 2014-04-01 DIAGNOSIS — Z79899 Other long term (current) drug therapy: Secondary | ICD-10-CM | POA: Diagnosis not present

## 2014-04-01 DIAGNOSIS — M79609 Pain in unspecified limb: Secondary | ICD-10-CM | POA: Diagnosis not present

## 2014-04-01 DIAGNOSIS — G479 Sleep disorder, unspecified: Secondary | ICD-10-CM | POA: Diagnosis not present

## 2014-04-01 DIAGNOSIS — R5381 Other malaise: Secondary | ICD-10-CM | POA: Diagnosis not present

## 2014-04-01 DIAGNOSIS — L8990 Pressure ulcer of unspecified site, unspecified stage: Secondary | ICD-10-CM | POA: Diagnosis not present

## 2014-04-01 DIAGNOSIS — I10 Essential (primary) hypertension: Secondary | ICD-10-CM | POA: Diagnosis not present

## 2014-04-01 DIAGNOSIS — I5032 Chronic diastolic (congestive) heart failure: Secondary | ICD-10-CM | POA: Diagnosis not present

## 2014-04-10 ENCOUNTER — Encounter: Payer: Medicare Other | Attending: Physical Medicine & Rehabilitation | Admitting: Physical Medicine & Rehabilitation

## 2014-04-10 ENCOUNTER — Encounter: Payer: Self-pay | Admitting: Physical Medicine & Rehabilitation

## 2014-04-10 VITALS — BP 118/56 | HR 78 | Resp 16 | Ht 61.0 in | Wt 103.0 lb

## 2014-04-10 DIAGNOSIS — M109 Gout, unspecified: Secondary | ICD-10-CM | POA: Diagnosis not present

## 2014-04-10 DIAGNOSIS — Z9089 Acquired absence of other organs: Secondary | ICD-10-CM | POA: Diagnosis not present

## 2014-04-10 DIAGNOSIS — R609 Edema, unspecified: Secondary | ICD-10-CM | POA: Diagnosis not present

## 2014-04-10 DIAGNOSIS — I10 Essential (primary) hypertension: Secondary | ICD-10-CM | POA: Diagnosis not present

## 2014-04-10 DIAGNOSIS — E039 Hypothyroidism, unspecified: Secondary | ICD-10-CM | POA: Insufficient documentation

## 2014-04-10 DIAGNOSIS — S72009A Fracture of unspecified part of neck of unspecified femur, initial encounter for closed fracture: Secondary | ICD-10-CM

## 2014-04-10 DIAGNOSIS — M25559 Pain in unspecified hip: Secondary | ICD-10-CM | POA: Diagnosis not present

## 2014-04-10 DIAGNOSIS — G4733 Obstructive sleep apnea (adult) (pediatric): Secondary | ICD-10-CM | POA: Diagnosis not present

## 2014-04-10 DIAGNOSIS — I509 Heart failure, unspecified: Secondary | ICD-10-CM | POA: Insufficient documentation

## 2014-04-10 DIAGNOSIS — K219 Gastro-esophageal reflux disease without esophagitis: Secondary | ICD-10-CM | POA: Insufficient documentation

## 2014-04-10 DIAGNOSIS — I2510 Atherosclerotic heart disease of native coronary artery without angina pectoris: Secondary | ICD-10-CM | POA: Insufficient documentation

## 2014-04-10 DIAGNOSIS — S72001A Fracture of unspecified part of neck of right femur, initial encounter for closed fracture: Secondary | ICD-10-CM

## 2014-04-10 DIAGNOSIS — I4891 Unspecified atrial fibrillation: Secondary | ICD-10-CM | POA: Insufficient documentation

## 2014-04-10 DIAGNOSIS — L989 Disorder of the skin and subcutaneous tissue, unspecified: Secondary | ICD-10-CM | POA: Insufficient documentation

## 2014-04-10 DIAGNOSIS — Z86718 Personal history of other venous thrombosis and embolism: Secondary | ICD-10-CM | POA: Insufficient documentation

## 2014-04-10 DIAGNOSIS — I82409 Acute embolism and thrombosis of unspecified deep veins of unspecified lower extremity: Secondary | ICD-10-CM

## 2014-04-10 DIAGNOSIS — I2789 Other specified pulmonary heart diseases: Secondary | ICD-10-CM | POA: Insufficient documentation

## 2014-04-10 DIAGNOSIS — H3530 Unspecified macular degeneration: Secondary | ICD-10-CM | POA: Diagnosis not present

## 2014-04-10 DIAGNOSIS — G4700 Insomnia, unspecified: Secondary | ICD-10-CM | POA: Diagnosis not present

## 2014-04-10 DIAGNOSIS — Z951 Presence of aortocoronary bypass graft: Secondary | ICD-10-CM | POA: Diagnosis not present

## 2014-04-10 DIAGNOSIS — I5030 Unspecified diastolic (congestive) heart failure: Secondary | ICD-10-CM | POA: Insufficient documentation

## 2014-04-10 DIAGNOSIS — M810 Age-related osteoporosis without current pathological fracture: Secondary | ICD-10-CM | POA: Diagnosis not present

## 2014-04-10 DIAGNOSIS — Z96649 Presence of unspecified artificial hip joint: Secondary | ICD-10-CM | POA: Diagnosis not present

## 2014-04-10 MED ORDER — PREDNISONE 20 MG PO TABS: 20.0000 mg | ORAL_TABLET | ORAL | Status: DC

## 2014-04-10 NOTE — Patient Instructions (Signed)
KEEP YOUR RIGHT HEEL ELEVATED UNTIL WOUND DECREASES BY ANOTHER 25% IN DIAMETER.   WE CAN TRY SOME OUTPT THERAPY ONCE YOU REMOVE THE WALKING SHOE.   PLEASE CALL ME WITH ANY PROBLEMS OR QUESTIONS (#[phone]).

## 2014-04-10 NOTE — Progress Notes (Signed)
Subjective:    Patient ID: Maria [name], female    DOB: [birth year], 80 y.o.   MRN: [id number]  HPI  Maria [name] is back after her hip fracture and subsequent inpatient rehab. She has gone through [facility] therapy and then outpt therapy. She has since move to a HEP.   She is having right lower extremity edema, left heel wound and hip pain.   She is applying a compression dressing every day. Her hip pain bothers her most often at night.   She ambulates with a rolling walker outside the home. She doesn't use a device at home. She has a walking shoe as well to protect the foot.      Pain Inventory Average Pain 3 Pain Right Now 1 My pain is intermittent and aching  In the last 24 hours, has pain interfered with the following? General activity 4 Relation with others 0 Enjoyment of life 2 What TIME of day is your pain at its worst? night Sleep (in general) Fair  Pain is worse with: walking Pain improves with: medication Relief from Meds: 8  Mobility use a walker how many minutes can you walk? 5 ability to climb steps?  yes do you drive?  no Do you have any goals in this area?  yes  Function retired I need assistance with the following:  shopping  Neuro/Psych bladder control problems trouble walking  Prior Studies Any changes since last visit?  no  Physicians involved in your care Leta Jungling, [place], [name]   Family History  Problem Relation Age of Onset  . Heart attack Mother   . CVA Mother   . CAD Father   . Colon cancer Father   . Heart attack Father   . Heart attack Brother   . Peripheral vascular disease Brother   . AAA (abdominal aortic aneurysm) Brother    History   Social History  . Marital Status: Married    Spouse Name: N/A    Number of Children: N/A  . Years of Education: N/A   Social History Main Topics  . Smoking status: Never Smoker   . Smokeless tobacco: Never Used  . Alcohol Use: No  . Drug Use: No  . Sexual Activity: Yes    Birth  Control/ Protection: Surgical   Other Topics Concern  . None   Social History Narrative  . None   Past Surgical History  Procedure Laterality Date  . Appendectomy    . Tubal ligation    . Tonsillectomy    . Trigger thumb    . Cardiac catheterization  08-06-13  . Tcs    . Bilateral cataract surgery    . Mandible surgery    . Coronary artery bypass graft N/A 08/28/2013    Procedure: CORONARY ARTERY BYPASS GRAFTING (CABG) x2 using right greater saphenous vein and left internal mammary artery. ;  Surgeon: Grace Isaac, MD;  Location: [place];  Service: Open Heart Surgery;  Laterality: N/A;  . Intraoperative transesophageal echocardiogram N/A 08/28/2013    Procedure: INTRAOPERATIVE TRANSESOPHAGEAL ECHOCARDIOGRAM;  Surgeon: Grace Isaac, MD;  Location: [place];  Service: Open Heart Surgery;  Laterality: N/A;  . Hip arthroplasty Right 12/09/2013    Procedure: ARTHROPLASTY BIPOLAR HIP CEMENTED;  Surgeon: Kerin Salen, MD;  Location: [place];  Service: Orthopedics;  Laterality: Right;   Past Medical History  Diagnosis Date  . Osteoporosis   . Celiac disease   . Celiac disease   . Carotid artery occlusion  right carotid stenosis 40-60%, 4/08, neg for stenosis repeat study 11/11  . Depression   . Presbycusis   . Macular degeneration   . Urethral stricture   . Moderate aortic insufficiency   . Moderate mitral regurgitation by prior echocardiogram     echo 9/13   . Thyroid nodule     437m, no change 11/11  . RLS (restless legs syndrome)   . Vitamin D deficiency   . Heart murmur   . Pulmonary hypertension   . Hypertension     takes Metoprolol daily  . Atrial fibrillation   . Coronary artery disease   . CHF (congestive heart failure)     takes Lasix daily  . MVP (mitral valve prolapse)   . Moderate obstructive sleep apnea     uses CPAP;sleep study about 4-535monthago  . Hearing loss     both ears  . Numbness     left arm  . Joint swelling   . GERD (gastroesophageal  reflux disease)     takes Omeprazole daily  . Gastric ulcer     6-37m55monthgo  . GI bleeding   . Constipation   . Diarrhea   . Hemorrhoids   . Urinary retention   . Urinary anastomotic stricture   . Hypothyroidism     takes Synthroid daily as result of AMiodarone  . Hot flashes     takes ZOloft 3 times a week  . Insomnia     takes Melatonin nightly  . Restless leg   . Peripheral edema     left  . DVT (deep venous thrombosis)   . Atonic bladder 12/11/2013   BP 118/56  Pulse 78  Resp 16  Ht 5' 1"  (1.549 m)  Wt 103 lb (46.72 kg)  BMI 19.47 kg/m2  SpO2 99%  Opioid Risk Score:   Fall Risk Score: High Fall Risk (>13 points) (patient educated handout declined)   Review of Systems  Constitutional: Positive for unexpected weight change.  Respiratory: Positive for shortness of breath.   Cardiovascular: Positive for leg swelling.  Gastrointestinal: Positive for constipation.  Genitourinary: Positive for difficulty urinating.  Musculoskeletal: Positive for gait problem.  All other systems reviewed and are negative.      Objective:   Physical Exam Constitutional: She is oriented to person, place, and time. No distress.  HENT:  Head: Normocephalic.  Eyes: EOM are normal.  Neck: Normal range of motion. Neck supple. No thyromegaly present.  Cardiovascular:  Cardiac rate controlled.  Respiratory: Effort normal GI: Soft.  She exhibits no   distension.  Neurological: She is alert and oriented to person, place, and time.  Musc: right leg quite tender. Edema 1+ RLE . 1+ left foot also She ambulates with a very steady gait, even without the walker. She is a little inhibited by darco shoe as far as her balance is concerned. She is tight in the rotators of her hip with IR and ER.    Skin: small quarter size area of eschar on the right inner heal--area dry, no drainage. Area remains tender.  Hip incision healed.   Hip/leg aligned.   Psych: appropriate.   Assessment/Plan:  1.  Right femoral neck fracture secondary to fall. Status post hemiarthroplasty 12/09/2013 weightbearing as tolerated with posterior hip cautions  -consider outpt PT to work on pelvic, hip rom, after darco shoe is off 2. DVT/ Anticoagulation: Xarelto. Has history of RLE DVT  -elevate right leg and compress - recommend continued xarelto for 9 months  as it pertains to DVT. Cards may want her on it longer for A fib.  3. Pain Management: continue local care, tramadol as needed. Increased ROM/exercise needed pain. May have to back off ultram if she continues to have hallucinations or AMS  4. ?gout right foot: -another small steroid taper was rx'ed -she will follow up with PCP next week for further work up if problem persists. -?uric acid level checked?  7. Diastolic congestive heart failure.    8. CAD/CABG.  9. Atrial fibrillation. Cardiac rate control. Continue xarelto  10. Hypothyroidism. Synthroid. Latest TSH level 2.175   11. Skin breakdown right heel-- much improved -would continue to keep foot dry and elevated until scab decreases further -would NOT apply creams or gels to scab at this point     Will see her back in about 2 months. 30 minutes of face to face patient care time were spent during this visit. All questions were encouraged and answered.

## 2014-04-15 ENCOUNTER — Ambulatory Visit
Admission: RE | Admit: 2014-04-15 | Discharge: 2014-04-15 | Disposition: A | Discharge status: Home / Self Care | Payer: Medicare Other | Source: Ambulatory Visit | Attending: Geriatric Medicine | Admitting: Geriatric Medicine

## 2014-04-15 ENCOUNTER — Other Ambulatory Visit: Payer: Self-pay | Admitting: Geriatric Medicine

## 2014-04-15 DIAGNOSIS — R05 Cough: Secondary | ICD-10-CM

## 2014-04-15 DIAGNOSIS — J438 Other emphysema: Secondary | ICD-10-CM | POA: Diagnosis not present

## 2014-04-15 DIAGNOSIS — R059 Cough, unspecified: Secondary | ICD-10-CM

## 2014-04-15 DIAGNOSIS — G479 Sleep disorder, unspecified: Secondary | ICD-10-CM | POA: Diagnosis not present

## 2014-04-15 DIAGNOSIS — K5900 Constipation, unspecified: Secondary | ICD-10-CM | POA: Diagnosis not present

## 2014-04-15 DIAGNOSIS — R609 Edema, unspecified: Secondary | ICD-10-CM | POA: Diagnosis not present

## 2014-04-15 DIAGNOSIS — I10 Essential (primary) hypertension: Secondary | ICD-10-CM | POA: Diagnosis not present

## 2014-04-15 DIAGNOSIS — L8990 Pressure ulcer of unspecified site, unspecified stage: Secondary | ICD-10-CM | POA: Diagnosis not present

## 2014-05-07 DIAGNOSIS — I10 Essential (primary) hypertension: Secondary | ICD-10-CM | POA: Diagnosis not present

## 2014-05-07 DIAGNOSIS — I4891 Unspecified atrial fibrillation: Secondary | ICD-10-CM | POA: Diagnosis not present

## 2014-05-07 DIAGNOSIS — L8990 Pressure ulcer of unspecified site, unspecified stage: Secondary | ICD-10-CM | POA: Diagnosis not present

## 2014-05-07 DIAGNOSIS — I82409 Acute embolism and thrombosis of unspecified deep veins of unspecified lower extremity: Secondary | ICD-10-CM | POA: Diagnosis not present

## 2014-05-07 DIAGNOSIS — R609 Edema, unspecified: Secondary | ICD-10-CM | POA: Diagnosis not present

## 2014-05-09 DIAGNOSIS — M25559 Pain in unspecified hip: Secondary | ICD-10-CM | POA: Diagnosis not present

## 2014-05-10 ENCOUNTER — Encounter: Payer: Self-pay | Admitting: Interventional Cardiology

## 2014-05-10 ENCOUNTER — Ambulatory Visit (INDEPENDENT_AMBULATORY_CARE_PROVIDER_SITE_OTHER): Payer: Medicare Other | Admitting: Interventional Cardiology

## 2014-05-10 VITALS — BP 132/55 | HR 73 | Ht 65.0 in | Wt 107.0 lb

## 2014-05-10 DIAGNOSIS — I5032 Chronic diastolic (congestive) heart failure: Secondary | ICD-10-CM | POA: Diagnosis not present

## 2014-05-10 DIAGNOSIS — I2510 Atherosclerotic heart disease of native coronary artery without angina pectoris: Secondary | ICD-10-CM | POA: Diagnosis not present

## 2014-05-10 DIAGNOSIS — Z5181 Encounter for therapeutic drug level monitoring: Secondary | ICD-10-CM | POA: Diagnosis not present

## 2014-05-10 DIAGNOSIS — I340 Nonrheumatic mitral (valve) insufficiency: Secondary | ICD-10-CM

## 2014-05-10 DIAGNOSIS — I059 Rheumatic mitral valve disease, unspecified: Secondary | ICD-10-CM

## 2014-05-10 DIAGNOSIS — I10 Essential (primary) hypertension: Secondary | ICD-10-CM

## 2014-05-10 DIAGNOSIS — Z951 Presence of aortocoronary bypass graft: Secondary | ICD-10-CM

## 2014-05-10 DIAGNOSIS — Z7901 Long term (current) use of anticoagulants: Secondary | ICD-10-CM

## 2014-05-10 MED ORDER — ASPIRIN EC 81 MG PO TBEC: 81.0000 mg | DELAYED_RELEASE_TABLET | Freq: Every day | ORAL | Status: DC

## 2014-05-10 NOTE — Progress Notes (Signed)
Patient ID: Maria [name], female   DOB: 03/31/1924, 80 y.o.   MRN: [id number]    [address] [place], [place]  [address] Phone: [phone] Fax:  [phone]  Date:  05/10/2014   ID:  Maria [name], DOB 1924-05-28, MRN [id number]  PCP:  Mathews Argyle, MD   ASSESSMENT:  1. Paroxysmal atrial fibrillation. Last known episode approximately 2 weeks ago. 2. DVT, now approximately 6 months ago 3. Coronary artery disease status post coronary bypass surgery 4. Chronic diastolic heart failure, stable 5. Frailty and weakness  PLAN:  1. Discontinue Xarelto 2. Coated aspirin 81 mg daily 3. Discontinue omeprazole 4. Clinical followup in 4-6 months   SUBJECTIVE: Maria [name] is a 80 y.o. female who slowly improves. Appetite is improving. Weight has not changed significantly. Had an episode of atrial fibrillation recently. She does want to be off of anticoagulation therapy. She had atrial appendage ligation of the time of bypass surgery. She denies angina. No orthopnea, or PND. She has noted some lower extremity swelling. After the recent episode of atrial fibrillation diltiazem was started by Dr. Felipa Eth. The husband discontinue this medication approximately a week ago after the patient began noticing edema.   Wt Readings from Last 3 Encounters:  05/10/14 107 lb (48.535 kg)  04/10/14 103 lb (46.72 kg)  02/04/14 103 lb (46.72 kg)     Past Medical History  Diagnosis Date  . Osteoporosis   . Celiac disease   . Celiac disease   . Carotid artery occlusion     right carotid stenosis 40-60%, 4/08, neg for stenosis repeat study 11/11  . Depression   . Presbycusis   . Macular degeneration   . Urethral stricture   . Moderate aortic insufficiency   . Moderate mitral regurgitation by prior echocardiogram     echo 9/13   . Thyroid nodule     65m, no change 11/11  . RLS (restless legs syndrome)   . Vitamin D deficiency   . Heart murmur   . Pulmonary  hypertension   . Hypertension     takes Metoprolol daily  . Atrial fibrillation   . Coronary artery disease   . CHF (congestive heart failure)     takes Lasix daily  . MVP (mitral valve prolapse)   . Moderate obstructive sleep apnea     uses CPAP;sleep study about 4-557monthago  . Hearing loss     both ears  . Numbness     left arm  . Joint swelling   . GERD (gastroesophageal reflux disease)     takes Omeprazole daily  . Gastric ulcer     6-7m43monthgo  . GI bleeding   . Constipation   . Diarrhea   . Hemorrhoids   . Urinary retention   . Urinary anastomotic stricture   . Hypothyroidism     takes Synthroid daily as result of AMiodarone  . Hot flashes     takes ZOloft 3 times a week  . Insomnia     takes Melatonin nightly  . Restless leg   . Peripheral edema     left  . DVT (deep venous thrombosis)   . Atonic bladder 12/11/2013    Current Outpatient Prescriptions  Medication Sig Dispense Refill  . acetaminophen (TYLENOL) 325 MG tablet Take 650 mg by mouth every 6 (six) hours as needed.      . furosemide (LASIX) 20 MG tablet Take 1 tablet (20 mg total) by mouth daily.  30 tablet  1  . levothyroxine (SYNTHROID, LEVOTHROID) 50 MCG tablet Take 1 tablet (50 mcg total) by mouth daily before breakfast.  30 tablet  11  . MULTIPLE VITAMIN PO Take 1 tablet by mouth daily.      Marland Kitchen omeprazole (PRILOSEC) 20 MG capsule Take 1 capsule (20 mg total) by mouth every other day.  30 capsule  1  . Rivaroxaban (XARELTO) 20 MG TABS tablet Take 1 tablet (20 mg total) by mouth daily with supper. Start January 3rd.  30 tablet  5  . temazepam (RESTORIL) 15 MG capsule 15 mg at bedtime as needed.      . traMADol (ULTRAM) 50 MG tablet Take 1 tablet (50 mg total) by mouth every 6 (six) hours as needed for moderate pain.  60 tablet  0   No current facility-administered medications for this visit.    Allergies:    Allergies  Allergen Reactions  . Amoxicillin Other (See Comments)    Nausea,  dizziness  . Codeine Nausea And Vomiting  . Fosamax [Alendronate Sodium] Other (See Comments)    Jaw pain  . Gluten Meal Other (See Comments)    Celiac Disease  . Hctz [Hydrochlorothiazide] Other (See Comments)    Hyponatremia, GI upset  . Tetanus Toxoids Other (See Comments)    Bad local reaction    Social History:  The patient  reports that she has never smoked. She has never used smokeless tobacco. She reports that she does not drink alcohol or use illicit drugs.   ROS:  Please see the history of present illness.   Weakness. Stable appetite. No chills or fever. Nonhealing ulcer right lower extremity. Denies transient neurological symptoms. Denies orthopnea PND   All other systems reviewed and negative.   OBJECTIVE: VS:  BP 132/55  Pulse 73  Ht 5' 5"  (1.651 m)  Wt 107 lb (48.535 kg)  BMI 17.81 kg/m2 Well nourished, well developed, in no acute distress, elderly and frail HEENT: normal Neck: JVD flat. Carotid bruit left carotid bruit is present  Cardiac:  normal S1, S2; RRR; 4-6/5 apical systolic murmur Lungs:  clear to auscultation bilaterally, no wheezing, rhonchi or rales Abd: soft, nontender, no hepatomegaly Ext: Edema trace ankle edema left. Pulses 2+ Skin: warm and dry Neuro:  CNs 2-12 intact, no focal abnormalities noted  EKG:  Not repeated       Signed, [place], MD 05/10/2014 3:59 PM

## 2014-05-10 NOTE — Patient Instructions (Signed)
Your physician has recommended you make the following change in your medication:   1. STOP XERALTO  2.STOP DILTIAZEM  3. STOP OMEPRAZOLE  4. START ASPIRIN 81 MG    Your physician wants you to follow-up in: [place] a reminder letter in the mail two months in advance. If you don't receive a letter, please call our office to schedule the follow-up appointment.

## 2014-05-31 DIAGNOSIS — K900 Celiac disease: Secondary | ICD-10-CM | POA: Diagnosis not present

## 2014-05-31 DIAGNOSIS — E039 Hypothyroidism, unspecified: Secondary | ICD-10-CM | POA: Diagnosis not present

## 2014-05-31 DIAGNOSIS — I10 Essential (primary) hypertension: Secondary | ICD-10-CM | POA: Diagnosis not present

## 2014-05-31 DIAGNOSIS — M810 Age-related osteoporosis without current pathological fracture: Secondary | ICD-10-CM | POA: Diagnosis not present

## 2014-05-31 DIAGNOSIS — L8990 Pressure ulcer of unspecified site, unspecified stage: Secondary | ICD-10-CM | POA: Diagnosis not present

## 2014-05-31 DIAGNOSIS — G479 Sleep disorder, unspecified: Secondary | ICD-10-CM | POA: Diagnosis not present

## 2014-06-14 ENCOUNTER — Encounter: Payer: Medicare Other | Admitting: Physical Medicine & Rehabilitation

## 2014-06-18 DIAGNOSIS — M810 Age-related osteoporosis without current pathological fracture: Secondary | ICD-10-CM | POA: Diagnosis not present

## 2014-07-15 DIAGNOSIS — R609 Edema, unspecified: Secondary | ICD-10-CM | POA: Diagnosis not present

## 2014-07-15 DIAGNOSIS — L8990 Pressure ulcer of unspecified site, unspecified stage: Secondary | ICD-10-CM | POA: Diagnosis not present

## 2014-07-15 DIAGNOSIS — R071 Chest pain on breathing: Secondary | ICD-10-CM | POA: Diagnosis not present

## 2014-07-15 DIAGNOSIS — Z23 Encounter for immunization: Secondary | ICD-10-CM | POA: Diagnosis not present

## 2014-07-15 DIAGNOSIS — M810 Age-related osteoporosis without current pathological fracture: Secondary | ICD-10-CM | POA: Diagnosis not present

## 2014-07-15 DIAGNOSIS — I10 Essential (primary) hypertension: Secondary | ICD-10-CM | POA: Diagnosis not present

## 2014-07-15 DIAGNOSIS — G479 Sleep disorder, unspecified: Secondary | ICD-10-CM | POA: Diagnosis not present

## 2014-07-22 ENCOUNTER — Encounter: Payer: Medicare Other | Attending: Physical Medicine & Rehabilitation | Admitting: Physical Medicine & Rehabilitation

## 2014-07-22 ENCOUNTER — Encounter: Payer: Self-pay | Admitting: Physical Medicine & Rehabilitation

## 2014-07-22 VITALS — BP 137/62 | HR 73 | Resp 14 | Wt 109.8 lb

## 2014-07-22 DIAGNOSIS — Z96649 Presence of unspecified artificial hip joint: Secondary | ICD-10-CM | POA: Diagnosis not present

## 2014-07-22 DIAGNOSIS — E039 Hypothyroidism, unspecified: Secondary | ICD-10-CM | POA: Diagnosis not present

## 2014-07-22 DIAGNOSIS — M79609 Pain in unspecified limb: Secondary | ICD-10-CM | POA: Diagnosis not present

## 2014-07-22 DIAGNOSIS — K219 Gastro-esophageal reflux disease without esophagitis: Secondary | ICD-10-CM | POA: Insufficient documentation

## 2014-07-22 DIAGNOSIS — S72001S Fracture of unspecified part of neck of right femur, sequela: Secondary | ICD-10-CM

## 2014-07-22 DIAGNOSIS — I2510 Atherosclerotic heart disease of native coronary artery without angina pectoris: Secondary | ICD-10-CM | POA: Insufficient documentation

## 2014-07-22 DIAGNOSIS — I509 Heart failure, unspecified: Secondary | ICD-10-CM | POA: Diagnosis not present

## 2014-07-22 DIAGNOSIS — G4733 Obstructive sleep apnea (adult) (pediatric): Secondary | ICD-10-CM | POA: Diagnosis not present

## 2014-07-22 DIAGNOSIS — Z86718 Personal history of other venous thrombosis and embolism: Secondary | ICD-10-CM | POA: Diagnosis not present

## 2014-07-22 DIAGNOSIS — I10 Essential (primary) hypertension: Secondary | ICD-10-CM | POA: Insufficient documentation

## 2014-07-22 DIAGNOSIS — Z951 Presence of aortocoronary bypass graft: Secondary | ICD-10-CM | POA: Insufficient documentation

## 2014-07-22 DIAGNOSIS — S72009S Fracture of unspecified part of neck of unspecified femur, sequela: Secondary | ICD-10-CM

## 2014-07-22 DIAGNOSIS — I5032 Chronic diastolic (congestive) heart failure: Secondary | ICD-10-CM | POA: Insufficient documentation

## 2014-07-22 DIAGNOSIS — M79604 Pain in right leg: Secondary | ICD-10-CM | POA: Insufficient documentation

## 2014-07-22 NOTE — Patient Instructions (Signed)
These exercises may help you when beginning to rehabilitate your injury. Your symptoms may resolve with or without further involvement from your physician, physical therapist, or athletic trainer. While completing these exercises, remember:   Restoring tissue flexibility helps normal motion to return to the joints. This allows healthier, less painful movement and activity.  An effective stretch should be held for at least 30 seconds.  A stretch should never be painful. You should only feel a gentle lengthening or release in the stretched tissue. STRETCH - Iliotibial Band  On the floor or bed, lie on your side so your injured leg is on top. Bend your knee and grab your ankle.  Slowly bring your knee back so that your thigh is in line with your trunk. Keep your heel at your buttocks and gently arch your back so your head, shoulders and hips line up.  Slowly lower your leg so that your knee approaches the floor/bed until you feel a gentle stretch on the outside of your thigh. If you do not feel a stretch and your knee will not fall farther, place the heel of your opposite foot on top of your knee and pull your thigh down farther.  Hold this stretch for __________ seconds.  Repeat __________ times. Complete this exercise __________ times per day. STRETCH - Hamstrings, Supine   Lie on your back. Loop a belt or towel over the ball of your foot as shown.  Straighten your knee and slowly pull on the belt to raise your injured leg. Do not allow the knee to bend. Keep your opposite leg flat on the floor.  Raise the leg until you feel a gentle stretch behind your knee or thigh. Hold this position for __________ seconds.  Repeat __________ times. Complete this stretch __________ times per day. STRETCH - Quadriceps, Prone   Lie on your stomach on a firm surface, such as a bed or padded floor.  Bend your knee and grasp your ankle. If you are unable to reach your ankle or pant leg, use a belt  around your foot to lengthen your reach.  Gently pull your heel toward your buttocks. Your knee should not slide out to the side. You should feel a stretch in the front of your thigh and/or knee.  Hold this position for __________ seconds.  Repeat __________ times. Complete this stretch __________ times per day. STRETCHING - Hip Flexors, Lunge Half kneel with your knee on the floor and your opposite knee bent and directly over your ankle.  Keep good posture with your head over your shoulders. Tighten your buttocks to point your tailbone downward; this will prevent your back from arching too much.  You should feel a gentle stretch in the front of your thigh and/or hip. If you do not feel any resistance, slightly slide your opposite foot forward and then slowly lunge forward so your knee once again lines up over your ankle. Be sure your tailbone remains pointed downward.  Hold this stretch for __________ seconds.  Repeat __________ times. Complete this stretch __________ times per day. STRETCH - Adductors, Lunge  While standing, spread your legs.  Lean away from your injured leg by bending your opposite knee. You may rest your hands on your thigh for balance.  You should feel a stretch in your inner thigh. Hold for __________ seconds.  Repeat __________ times. Complete this exercise __________ times per day. Document Released: 11/18/2004 Document Revised: 02/25/2014 Document Reviewed: 01/23/2009 [facility] Patient Information 2015 [place], [place]. This information is  not intended to replace advice given to you by your health care provider. Make sure you discuss any questions you have with your health care provider.

## 2014-07-22 NOTE — Progress Notes (Signed)
Subjective:    Patient ID: Maria [name], female    DOB: 1924/06/26, 80 y.o.   MRN: [id number]  HPI  Pain Inventory Average Pain 3 Pain Right Now 3 My pain is dull, stabbing, tingling and aching  In the last 24 hours, has pain interfered with the following? General activity 3 Relation with others 3 Enjoyment of life 3 What TIME of day is your pain at its worst? morning Sleep (in general) Poor  Pain is worse with: walking Pain improves with: rest Relief from Meds: 5  Mobility walk without assistance do you drive?  yes  Function I need assistance with the following:  household duties  Neuro/Psych numbness tingling  Prior Studies Any changes since last visit?  no  Physicians involved in your care Any changes since last visit?  no   Family History  Problem Relation Age of Onset  . Heart attack Mother   . CVA Mother   . CAD Father   . Colon cancer Father   . Heart attack Father   . Heart attack Brother   . Peripheral vascular disease Brother   . AAA (abdominal aortic aneurysm) Brother    History   Social History  . Marital Status: Married    Spouse Name: N/A    Number of Children: N/A  . Years of Education: N/A   Social History Main Topics  . Smoking status: Never Smoker   . Smokeless tobacco: Never Used  . Alcohol Use: No  . Drug Use: No  . Sexual Activity: Yes    Birth Control/ Protection: Surgical   Other Topics Concern  . None   Social History Narrative  . None   Past Surgical History  Procedure Laterality Date  . Appendectomy    . Tubal ligation    . Tonsillectomy    . Trigger thumb    . Cardiac catheterization  08-06-13  . Tcs    . Bilateral cataract surgery    . Mandible surgery    . Coronary artery bypass graft N/A 08/28/2013    Procedure: CORONARY ARTERY BYPASS GRAFTING (CABG) x2 using right greater saphenous vein and left internal mammary artery. ;  Surgeon: Grace Isaac, MD;  Location: [place];  Service: Open Heart  Surgery;  Laterality: N/A;  . Intraoperative transesophageal echocardiogram N/A 08/28/2013    Procedure: INTRAOPERATIVE TRANSESOPHAGEAL ECHOCARDIOGRAM;  Surgeon: Grace Isaac, MD;  Location: [place];  Service: Open Heart Surgery;  Laterality: N/A;  . Hip arthroplasty Right 12/09/2013    Procedure: ARTHROPLASTY BIPOLAR HIP CEMENTED;  Surgeon: Kerin Salen, MD;  Location: [place];  Service: Orthopedics;  Laterality: Right;   Past Medical History  Diagnosis Date  . Osteoporosis   . Celiac disease   . Celiac disease   . Carotid artery occlusion     right carotid stenosis 40-60%, 4/08, neg for stenosis repeat study 11/11  . Depression   . Presbycusis   . Macular degeneration   . Urethral stricture   . Moderate aortic insufficiency   . Moderate mitral regurgitation by prior echocardiogram     echo 9/13   . Thyroid nodule     16m, no change 11/11  . RLS (restless legs syndrome)   . Vitamin D deficiency   . Heart murmur   . Pulmonary hypertension   . Hypertension     takes Metoprolol daily  . Atrial fibrillation   . Coronary artery disease   . CHF (congestive heart failure)  takes Lasix daily  . MVP (mitral valve prolapse)   . Moderate obstructive sleep apnea     uses CPAP;sleep study about 4-48month ago  . Hearing loss     both ears  . Numbness     left arm  . Joint swelling   . GERD (gastroesophageal reflux disease)     takes Omeprazole daily  . Gastric ulcer     6-8109monthago  . GI bleeding   . Constipation   . Diarrhea   . Hemorrhoids   . Urinary retention   . Urinary anastomotic stricture   . Hypothyroidism     takes Synthroid daily as result of AMiodarone  . Hot flashes     takes ZOloft 3 times a week  . Insomnia     takes Melatonin nightly  . Restless leg   . Peripheral edema     left  . DVT (deep venous thrombosis)   . Atonic bladder 12/11/2013   BP 137/62  Pulse 73  Resp 14  Wt 109 lb 12.8 oz (49.805 kg)  SpO2 100%  Opioid Risk Score:   Fall  Risk Score: Moderate Fall Risk (6-13 points) (previously educated and decllined handout)  Review of Systems  Skin: Positive for wound.       Dime sized scab on right heel pt calls "bedsore"  Neurological: Positive for numbness.       Tingling  All other systems reviewed and are negative.      Objective:   Physical Exam Constitutional: She is oriented to person, place, and time. No distress.  HENT:  Head: Normocephalic.  Eyes: EOM are normal.  Neck: Normal range of motion. Neck supple. No thyromegaly present.  Cardiovascular:  Cardiac rate controlled.  Respiratory: Effort normal  GI: Soft. She exhibits no distension.  Neurological: She is alert and oriented to person, place, and time.  Musc: right leg quite tender. Edema 1+ RLE . 1+ left foot also  She ambulates with a very steady gait without an AD.  right Leg still tight in ER. Pain along gluts with palpation. Pain worsened also with stretching of the right leg in flexion and IR.. Marland KitchenMinimal pain in right calf. Trace edema Skin: small dime size area of eschar on the right inner heal--area dry, no drainage. Area remains tender.  Hip incision healed. Hip/leg aligned.  Psych: appropriate.   Assessment/Plan:  1. Right femoral neck fracture secondary to fall. Status post hemiarthroplasty 12/09/2013 weightbearing as tolerated with posterior hip cautions  -reviewed some basic stretches for her gluteal muscles to help spasms and pain -encouraged ongoing walking and daily physical activity 2. History of RLE DVT  -xarelto completed  3. Diastolic congestive heart failure.--per cards team  8. CAD/CABG.   I will see her back prn.    15 minutes of face to face patient care time were spent during this visit. All questions were encouraged and answered.

## 2014-08-16 DIAGNOSIS — H903 Sensorineural hearing loss, bilateral: Secondary | ICD-10-CM | POA: Diagnosis not present

## 2014-10-03 ENCOUNTER — Encounter (HOSPITAL_COMMUNITY): Payer: Self-pay | Admitting: Interventional Cardiology

## 2014-10-18 ENCOUNTER — Encounter (HOSPITAL_COMMUNITY): Payer: Self-pay | Admitting: *Deleted

## 2014-10-18 ENCOUNTER — Inpatient Hospital Stay (HOSPITAL_COMMUNITY)
Admission: EM | Admit: 2014-10-18 | Discharge: 2014-10-19 | DRG: 308 | Disposition: A | Discharge status: Home / Self Care | Payer: Medicare Other | Attending: Cardiology | Admitting: Cardiology

## 2014-10-18 ENCOUNTER — Emergency Department (HOSPITAL_COMMUNITY): Payer: Medicare Other

## 2014-10-18 DIAGNOSIS — I340 Nonrheumatic mitral (valve) insufficiency: Secondary | ICD-10-CM | POA: Diagnosis not present

## 2014-10-18 DIAGNOSIS — F329 Major depressive disorder, single episode, unspecified: Secondary | ICD-10-CM | POA: Diagnosis present

## 2014-10-18 DIAGNOSIS — Z86718 Personal history of other venous thrombosis and embolism: Secondary | ICD-10-CM | POA: Diagnosis not present

## 2014-10-18 DIAGNOSIS — Z5181 Encounter for therapeutic drug level monitoring: Secondary | ICD-10-CM

## 2014-10-18 DIAGNOSIS — Z91018 Allergy to other foods: Secondary | ICD-10-CM

## 2014-10-18 DIAGNOSIS — R011 Cardiac murmur, unspecified: Secondary | ICD-10-CM | POA: Diagnosis present

## 2014-10-18 DIAGNOSIS — N183 Chronic kidney disease, stage 3 unspecified: Secondary | ICD-10-CM | POA: Diagnosis present

## 2014-10-18 DIAGNOSIS — E559 Vitamin D deficiency, unspecified: Secondary | ICD-10-CM | POA: Diagnosis present

## 2014-10-18 DIAGNOSIS — H9110 Presbycusis, unspecified ear: Secondary | ICD-10-CM | POA: Diagnosis present

## 2014-10-18 DIAGNOSIS — Z79899 Other long term (current) drug therapy: Secondary | ICD-10-CM

## 2014-10-18 DIAGNOSIS — Z885 Allergy status to narcotic agent status: Secondary | ICD-10-CM | POA: Diagnosis not present

## 2014-10-18 DIAGNOSIS — I5033 Acute on chronic diastolic (congestive) heart failure: Secondary | ICD-10-CM

## 2014-10-18 DIAGNOSIS — I4891 Unspecified atrial fibrillation: Secondary | ICD-10-CM | POA: Diagnosis not present

## 2014-10-18 DIAGNOSIS — R339 Retention of urine, unspecified: Secondary | ICD-10-CM | POA: Diagnosis present

## 2014-10-18 DIAGNOSIS — H9190 Unspecified hearing loss, unspecified ear: Secondary | ICD-10-CM | POA: Diagnosis present

## 2014-10-18 DIAGNOSIS — Z888 Allergy status to other drugs, medicaments and biological substances status: Secondary | ICD-10-CM | POA: Diagnosis not present

## 2014-10-18 DIAGNOSIS — E039 Hypothyroidism, unspecified: Secondary | ICD-10-CM | POA: Diagnosis present

## 2014-10-18 DIAGNOSIS — Z7982 Long term (current) use of aspirin: Secondary | ICD-10-CM | POA: Diagnosis not present

## 2014-10-18 DIAGNOSIS — Z96641 Presence of right artificial hip joint: Secondary | ICD-10-CM | POA: Diagnosis present

## 2014-10-18 DIAGNOSIS — Z9889 Other specified postprocedural states: Secondary | ICD-10-CM | POA: Diagnosis not present

## 2014-10-18 DIAGNOSIS — R7989 Other specified abnormal findings of blood chemistry: Secondary | ICD-10-CM | POA: Diagnosis present

## 2014-10-18 DIAGNOSIS — G4733 Obstructive sleep apnea (adult) (pediatric): Secondary | ICD-10-CM | POA: Diagnosis present

## 2014-10-18 DIAGNOSIS — Z887 Allergy status to serum and vaccine status: Secondary | ICD-10-CM | POA: Diagnosis not present

## 2014-10-18 DIAGNOSIS — Z8249 Family history of ischemic heart disease and other diseases of the circulatory system: Secondary | ICD-10-CM | POA: Diagnosis not present

## 2014-10-18 DIAGNOSIS — Z8711 Personal history of peptic ulcer disease: Secondary | ICD-10-CM

## 2014-10-18 DIAGNOSIS — G2581 Restless legs syndrome: Secondary | ICD-10-CM | POA: Diagnosis present

## 2014-10-18 DIAGNOSIS — E041 Nontoxic single thyroid nodule: Secondary | ICD-10-CM | POA: Diagnosis present

## 2014-10-18 DIAGNOSIS — I5032 Chronic diastolic (congestive) heart failure: Secondary | ICD-10-CM | POA: Diagnosis not present

## 2014-10-18 DIAGNOSIS — N312 Flaccid neuropathic bladder, not elsewhere classified: Secondary | ICD-10-CM | POA: Diagnosis present

## 2014-10-18 DIAGNOSIS — M810 Age-related osteoporosis without current pathological fracture: Secondary | ICD-10-CM | POA: Diagnosis present

## 2014-10-18 DIAGNOSIS — I341 Nonrheumatic mitral (valve) prolapse: Secondary | ICD-10-CM | POA: Diagnosis present

## 2014-10-18 DIAGNOSIS — H3530 Unspecified macular degeneration: Secondary | ICD-10-CM | POA: Diagnosis present

## 2014-10-18 DIAGNOSIS — D509 Iron deficiency anemia, unspecified: Secondary | ICD-10-CM | POA: Diagnosis present

## 2014-10-18 DIAGNOSIS — R0602 Shortness of breath: Secondary | ICD-10-CM | POA: Diagnosis not present

## 2014-10-18 DIAGNOSIS — K219 Gastro-esophageal reflux disease without esophagitis: Secondary | ICD-10-CM | POA: Diagnosis present

## 2014-10-18 DIAGNOSIS — I129 Hypertensive chronic kidney disease with stage 1 through stage 4 chronic kidney disease, or unspecified chronic kidney disease: Secondary | ICD-10-CM | POA: Diagnosis present

## 2014-10-18 DIAGNOSIS — D649 Anemia, unspecified: Secondary | ICD-10-CM | POA: Diagnosis present

## 2014-10-18 DIAGNOSIS — I270 Primary pulmonary hypertension: Secondary | ICD-10-CM

## 2014-10-18 DIAGNOSIS — I2510 Atherosclerotic heart disease of native coronary artery without angina pectoris: Secondary | ICD-10-CM | POA: Diagnosis present

## 2014-10-18 DIAGNOSIS — I6521 Occlusion and stenosis of right carotid artery: Secondary | ICD-10-CM | POA: Diagnosis present

## 2014-10-18 DIAGNOSIS — E871 Hypo-osmolality and hyponatremia: Secondary | ICD-10-CM | POA: Diagnosis present

## 2014-10-18 DIAGNOSIS — I25709 Atherosclerosis of coronary artery bypass graft(s), unspecified, with unspecified angina pectoris: Secondary | ICD-10-CM

## 2014-10-18 DIAGNOSIS — I083 Combined rheumatic disorders of mitral, aortic and tricuspid valves: Secondary | ICD-10-CM | POA: Diagnosis present

## 2014-10-18 DIAGNOSIS — Z951 Presence of aortocoronary bypass graft: Secondary | ICD-10-CM

## 2014-10-18 DIAGNOSIS — I272 Other secondary pulmonary hypertension: Secondary | ICD-10-CM | POA: Diagnosis present

## 2014-10-18 DIAGNOSIS — Z880 Allergy status to penicillin: Secondary | ICD-10-CM

## 2014-10-18 DIAGNOSIS — G4700 Insomnia, unspecified: Secondary | ICD-10-CM | POA: Diagnosis present

## 2014-10-18 DIAGNOSIS — N359 Urethral stricture, unspecified: Secondary | ICD-10-CM | POA: Diagnosis present

## 2014-10-18 DIAGNOSIS — K900 Celiac disease: Secondary | ICD-10-CM | POA: Diagnosis present

## 2014-10-18 DIAGNOSIS — I480 Paroxysmal atrial fibrillation: Secondary | ICD-10-CM | POA: Diagnosis not present

## 2014-10-18 DIAGNOSIS — Z7901 Long term (current) use of anticoagulants: Secondary | ICD-10-CM | POA: Diagnosis not present

## 2014-10-18 DIAGNOSIS — R778 Other specified abnormalities of plasma proteins: Secondary | ICD-10-CM | POA: Diagnosis present

## 2014-10-18 HISTORY — DX: Chronic kidney disease, stage 3 unspecified: N18.30

## 2014-10-18 HISTORY — DX: Long term (current) use of anticoagulants: Z79.01

## 2014-10-18 HISTORY — DX: Chronic kidney disease, stage 3 (moderate): N18.3

## 2014-10-18 HISTORY — DX: Nonrheumatic aortic (valve) insufficiency: I35.1

## 2014-10-18 HISTORY — DX: Paroxysmal atrial fibrillation: I48.0

## 2014-10-18 HISTORY — DX: Chronic diastolic (congestive) heart failure: I50.32

## 2014-10-18 HISTORY — DX: Anemia, unspecified: D64.9

## 2014-10-18 LAB — COMPREHENSIVE METABOLIC PANEL
ALT: 18 U/L (ref 0–35)
ANION GAP: 6 (ref 5–15)
AST: 30 U/L (ref 0–37)
Albumin: 3.3 g/dL — ABNORMAL LOW (ref 3.5–5.2)
Alkaline Phosphatase: 73 U/L (ref 39–117)
BUN: 16 mg/dL (ref 6–23)
CO2: 24 mmol/L (ref 19–32)
Calcium: 9 mg/dL (ref 8.4–10.5)
Chloride: 102 mEq/L (ref 96–112)
Creatinine, Ser: 0.7 mg/dL (ref 0.50–1.10)
GFR, EST AFRICAN AMERICAN: 87 mL/min — AB (ref >90)
GFR, EST NON AFRICAN AMERICAN: 75 mL/min — AB (ref >90)
Glucose, Bld: 102 mg/dL — ABNORMAL HIGH (ref 70–99)
Potassium: 4 mmol/L (ref 3.5–5.1)
Sodium: 132 mmol/L — ABNORMAL LOW (ref 135–145)
Total Bilirubin: 0.4 mg/dL (ref 0.3–1.2)
Total Protein: 6.5 g/dL (ref 6.0–8.3)

## 2014-10-18 LAB — URINALYSIS, ROUTINE W REFLEX MICROSCOPIC
BILIRUBIN URINE: NEGATIVE
Glucose, UA: NEGATIVE mg/dL
KETONES UR: NEGATIVE mg/dL
NITRITE: NEGATIVE
PH: 6.5 (ref 5.0–8.0)
Protein, ur: NEGATIVE mg/dL
Specific Gravity, Urine: 1.009 (ref 1.005–1.030)
Urobilinogen, UA: 0.2 mg/dL (ref 0.0–1.0)

## 2014-10-18 LAB — CBC WITH DIFFERENTIAL/PLATELET
Basophils Absolute: 0 K/uL (ref 0.0–0.1)
Basophils Relative: 0 % (ref 0–1)
Eosinophils Absolute: 0.1 K/uL (ref 0.0–0.7)
Eosinophils Relative: 2 % (ref 0–5)
HCT: 31.3 % — ABNORMAL LOW (ref 36.0–46.0)
Hemoglobin: 10.2 g/dL — ABNORMAL LOW (ref 12.0–15.0)
Lymphocytes Relative: 38 % (ref 12–46)
Lymphs Abs: 2 K/uL (ref 0.7–4.0)
MCH: 25.1 pg — ABNORMAL LOW (ref 26.0–34.0)
MCHC: 32.6 g/dL (ref 30.0–36.0)
MCV: 76.9 fL — ABNORMAL LOW (ref 78.0–100.0)
Monocytes Absolute: 0.5 K/uL (ref 0.1–1.0)
Monocytes Relative: 9 % (ref 3–12)
Neutro Abs: 2.7 K/uL (ref 1.7–7.7)
Neutrophils Relative %: 51 % (ref 43–77)
Platelets: 266 K/uL (ref 150–400)
RBC: 4.07 MIL/uL (ref 3.87–5.11)
RDW: 19.1 % — ABNORMAL HIGH (ref 11.5–15.5)
WBC: 5.3 K/uL (ref 4.0–10.5)

## 2014-10-18 LAB — TROPONIN I
TROPONIN I: 0.08 ng/mL — AB (ref <0.031)
Troponin I: 0.07 ng/mL — ABNORMAL HIGH (ref <0.031)
Troponin I: 0.06 ng/mL — ABNORMAL HIGH (ref <0.031)

## 2014-10-18 LAB — URINE MICROSCOPIC-ADD ON

## 2014-10-18 LAB — BRAIN NATRIURETIC PEPTIDE: B Natriuretic Peptide: 609 pg/mL — ABNORMAL HIGH (ref 0.0–100.0)

## 2014-10-18 LAB — PROTIME-INR
INR: 1.71 — ABNORMAL HIGH (ref 0.00–1.49)
Prothrombin Time: 20.2 seconds — ABNORMAL HIGH (ref 11.6–15.2)

## 2014-10-18 LAB — MAGNESIUM: Magnesium: 2 mg/dL (ref 1.5–2.5)

## 2014-10-18 LAB — TSH
TSH: 2.482 u[IU]/mL (ref 0.350–4.500)
TSH: 2.573 u[IU]/mL (ref 0.350–4.500)

## 2014-10-18 LAB — APTT: aPTT: 44 seconds — ABNORMAL HIGH (ref 24–37)

## 2014-10-18 MED ORDER — RIVAROXABAN 20 MG PO TABS
20.0000 mg | ORAL_TABLET | Freq: Once | ORAL | Status: AC
  Administered 2014-10-18: 20 mg via ORAL
  Filled 2014-10-18: qty 1

## 2014-10-18 MED ORDER — AMIODARONE HCL 200 MG PO TABS
200.0000 mg | ORAL_TABLET | Freq: Two times a day (BID) | ORAL | Status: DC
  Administered 2014-10-18 – 2014-10-19 (×2): 200 mg via ORAL
  Filled 2014-10-18 (×3): qty 1

## 2014-10-18 MED ORDER — ONDANSETRON HCL 4 MG/2ML IJ SOLN: 4.0000 mg | Freq: Four times a day (QID) | INTRAMUSCULAR | Status: DC | PRN

## 2014-10-18 MED ORDER — AMIODARONE LOAD VIA INFUSION
150.0000 mg | Freq: Once | INTRAVENOUS | Status: AC
  Administered 2014-10-18: 150 mg via INTRAVENOUS
  Filled 2014-10-18: qty 83.34

## 2014-10-18 MED ORDER — ASPIRIN 325 MG PO TABS
325.0000 mg | ORAL_TABLET | ORAL | Status: AC
  Administered 2014-10-18: 325 mg via ORAL
  Filled 2014-10-18: qty 1

## 2014-10-18 MED ORDER — ADULT MULTIVITAMIN W/MINERALS CH
1.0000 | ORAL_TABLET | Freq: Every day | ORAL | Status: DC
  Administered 2014-10-19: 1 via ORAL
  Filled 2014-10-18: qty 1

## 2014-10-18 MED ORDER — FUROSEMIDE 10 MG/ML IJ SOLN
40.0000 mg | Freq: Once | INTRAMUSCULAR | Status: AC
  Administered 2014-10-18: 40 mg via INTRAVENOUS
  Filled 2014-10-18: qty 4

## 2014-10-18 MED ORDER — ALPRAZOLAM 0.25 MG PO TABS
0.2500 mg | ORAL_TABLET | Freq: Two times a day (BID) | ORAL | Status: DC | PRN
Start: 2014-10-18 — End: 2014-10-19
  Administered 2014-10-18: 0.25 mg via ORAL
  Filled 2014-10-18: qty 1

## 2014-10-18 MED ORDER — RIVAROXABAN 20 MG PO TABS
20.0000 mg | ORAL_TABLET | Freq: Every day | ORAL | Status: DC
  Administered 2014-10-19: 20 mg via ORAL
  Filled 2014-10-18: qty 1

## 2014-10-18 MED ORDER — RIVAROXABAN 20 MG PO TABS: 20.0000 mg | ORAL_TABLET | Freq: Every day | ORAL | Status: DC

## 2014-10-18 MED ORDER — ACETAMINOPHEN 325 MG PO TABS: 650.0000 mg | ORAL_TABLET | Freq: Four times a day (QID) | ORAL | Status: DC | PRN

## 2014-10-18 MED ORDER — ACETAMINOPHEN 325 MG PO TABS: 650.0000 mg | ORAL_TABLET | ORAL | Status: DC | PRN

## 2014-10-18 MED ORDER — AMIODARONE HCL IN DEXTROSE 360-4.14 MG/200ML-% IV SOLN
30.0000 mg/h | INTRAVENOUS | Status: DC
  Administered 2014-10-18: 30 mg/h via INTRAVENOUS
  Filled 2014-10-18 (×3): qty 200

## 2014-10-18 MED ORDER — DILTIAZEM HCL 25 MG/5ML IV SOLN
10.0000 mg | Freq: Once | INTRAVENOUS | Status: AC
  Administered 2014-10-18: 10 mg via INTRAVENOUS
  Filled 2014-10-18: qty 5

## 2014-10-18 MED ORDER — ASPIRIN EC 81 MG PO TBEC
81.0000 mg | DELAYED_RELEASE_TABLET | Freq: Every day | ORAL | Status: DC
  Administered 2014-10-19: 81 mg via ORAL
  Filled 2014-10-18: qty 1

## 2014-10-18 MED ORDER — VITAMIN D3 25 MCG (1000 UNIT) PO TABS
1000.0000 [IU] | ORAL_TABLET | Freq: Every day | ORAL | Status: DC
  Administered 2014-10-19: 1000 [IU] via ORAL
  Filled 2014-10-18: qty 1

## 2014-10-18 MED ORDER — LEVOTHYROXINE SODIUM 50 MCG PO TABS
50.0000 ug | ORAL_TABLET | Freq: Every day | ORAL | Status: DC
  Administered 2014-10-19: 50 ug via ORAL
  Filled 2014-10-18 (×2): qty 1

## 2014-10-18 MED ORDER — SODIUM CHLORIDE 0.9 % IV SOLN
INTRAVENOUS | Status: DC
  Administered 2014-10-18: 10 mL/h via INTRAVENOUS

## 2014-10-18 MED ORDER — FUROSEMIDE 40 MG PO TABS
40.0000 mg | ORAL_TABLET | Freq: Every day | ORAL | Status: DC
  Administered 2014-10-19: 40 mg via ORAL
  Filled 2014-10-18: qty 1

## 2014-10-18 MED ORDER — AMIODARONE HCL IN DEXTROSE 360-4.14 MG/200ML-% IV SOLN
60.0000 mg/h | INTRAVENOUS | Status: DC
  Administered 2014-10-18: 60 mg/h via INTRAVENOUS
  Filled 2014-10-18: qty 200

## 2014-10-18 NOTE — Progress Notes (Signed)
Attempted to call report to Cecille Rubin, Therapist, sports. She stated that she would call me back.

## 2014-10-18 NOTE — ED Notes (Signed)
Pt states that she feels a little shaky and nauseous. Pt states she feel like its because of her A-fib.

## 2014-10-18 NOTE — ED Notes (Signed)
Pt. To the bathroom, gait steady.  Pt. Denies any sob or chest pain

## 2014-10-18 NOTE — H&P (Signed)
Maria [name] is an 80 y.o. female.    Primary Cardiologist:Dr. [name] PCP:  Mathews Argyle, MD  Chief Complaint: complained of a fib and feeling shaky and nauseated  HPI: 80 year old female with hx CABG X 2 vessel 08/2013 ( LIMA-LAD, rSVG to LCX and Lt atrial clip) also hx PAF but she had her xarelto stopped in July.  She stopped after 6 months for DVT.  She has chronic diastolic HF.  She has hx of DVT.  She has episodes of a fib- though today she states she really did not think it was a fib. There are varying histories concerning whether she has actually had atrial fib since her bypass surgery. Her husband tells me (retired Administrator, [employer]) that she had been on amiodarone for 6 months at one point. This was stopped. He also says that it was around this time that she was noted to be hypothyroid. She says that for several weeks she has not been feeling as well as usual. Her husband mentions that he thinks she has been having some orthopnea.  Yesterday she felt tired more than her usual, and she was SOB, Her HR was in the 100s and irregular - she tried staying home but did not rest well with SOB and just being restless.   No chest pain.  Her husband decided that she should come into the hospital today. She had atrial fibrillation with mild increase in heart rate. She was given IV dilt and HR slowed to the 80s, but now climbing back up.     Her troponin is mildly elevated.   Last Echo 10/2013  Left ventricle: Systolic function was normal. The estimated ejection fraction was in the range of 60% to 65%. - Aortic valve: Mild regurgitation. - Mitral valve: Moderate regurgitation. - Right atrium: The atrium was moderately dilated. - Tricuspid valve: Moderate regurgitation. - Pulmonary arteries: PA peak pressure: 58m Hg (S). - Pericardium, extracardiac: A trivial pericardial effusion was identified.  Last Rt and Lt cardiac cath 08/16/13  Prior to CABG: 1. Moderate to  moderately severe mitral regurgitation 2. Severe two-vessel coronary artery disease including diffuse LAD disease and relatively focal circumflex disease 3. Normal left ventricular systolic function with elevated end-diastolic pressures consistent with diastolic heart failure but she exhibits clinically 4. Moderate pulmonary hypertension    Past Medical History  Diagnosis Date  . Osteoporosis   . Celiac disease   . Celiac disease   . Carotid artery occlusion     right carotid stenosis 40-60%, 4/08, neg for stenosis repeat study 11/11  . Depression   . Presbycusis   . Macular degeneration   . Urethral stricture   . Moderate aortic insufficiency   . Moderate mitral regurgitation by prior echocardiogram     echo 9/13   . Thyroid nodule     7914m no change 11/11  . RLS (restless legs syndrome)   . Vitamin D deficiency   . Heart murmur   . Pulmonary hypertension   . Hypertension     takes Metoprolol daily  . Atrial fibrillation   . Coronary artery disease   . CHF (congestive heart failure)     takes Lasix daily  . MVP (mitral valve prolapse)   . Moderate obstructive sleep apnea     uses CPAP;sleep study about 4-14m214monthgo  . Hearing loss     both ears  . Numbness     left arm  . Joint swelling   .  GERD (gastroesophageal reflux disease)     takes Omeprazole daily  . Gastric ulcer     6-37month ago  . GI bleeding   . Constipation   . Diarrhea   . Hemorrhoids   . Urinary retention   . Urinary anastomotic stricture   . Hypothyroidism     takes Synthroid daily as result of AMiodarone  . Hot flashes     takes ZOloft 3 times a week  . Insomnia     takes Melatonin nightly  . Restless leg   . Peripheral edema     left  . DVT (deep venous thrombosis)   . Atonic bladder 12/11/2013    Past Surgical History  Procedure Laterality Date  . Appendectomy    . Tubal ligation    . Tonsillectomy    . Trigger thumb    . Cardiac catheterization  08-06-13  . Tcs    .  Bilateral cataract surgery    . Mandible surgery    . Coronary artery bypass graft N/A 08/28/2013    Procedure: CORONARY ARTERY BYPASS GRAFTING (CABG) x2 using right greater saphenous vein and left internal mammary artery. ;  Surgeon: EGrace Isaac MD;  [place] [facility]  Service: Open Heart Surgery;  Laterality: N/A;  . Intraoperative transesophageal echocardiogram N/A 08/28/2013    Procedure: INTRAOPERATIVE TRANSESOPHAGEAL ECHOCARDIOGRAM;  Surgeon: EGrace Isaac MD;  [place] [facility]  Service: Open Heart Surgery;  Laterality: N/A;  . Hip arthroplasty Right 12/09/2013    Procedure: ARTHROPLASTY BIPOLAR HIP CEMENTED;  Surgeon: FKerin Salen MD;  [place] [facility]  Service: Orthopedics;  Laterality: Right;  . Left and right heart catheterization with coronary angiogram N/A 08/16/2013    Procedure: LEFT AND RIGHT HEART CATHETERIZATION WITH CORONARY ANGIOGRAM;  Surgeon: HSinclair Grooms MD;  [facility];  Service: Cardiovascular;  Laterality: N/A;    Family History  Problem Relation Age of Onset  . Heart attack Mother   . CVA Mother   . CAD Father   . Colon cancer Father   . Heart attack Father   . Heart attack Brother   . Peripheral vascular disease Brother   . AAA (abdominal aortic aneurysm) Brother    Social History:  reports that she has never smoked. She has never used smokeless tobacco. She reports that she does not drink alcohol or use illicit drugs.  Allergies:  Allergies  Allergen Reactions  . Amoxicillin Other (See Comments)    Nausea, dizziness  . Codeine Nausea And Vomiting  . Fosamax [Alendronate Sodium] Other (See Comments)    Jaw pain  . Gluten Meal Other (See Comments)    Celiac Disease  . Hctz [Hydrochlorothiazide] Other (See Comments)    Hyponatremia, GI upset  . Tetanus Toxoids Other (See Comments)    Bad local reaction    Outpatient Medications:  No current facility-administered medications on file prior to encounter.   Current  Outpatient Prescriptions on File Prior to Encounter  Medication Sig Dispense Refill  . acetaminophen (TYLENOL) 325 MG tablet Take 650 mg by mouth every 6 (six) hours as needed.    .Marland Kitchenaspirin EC 81 MG tablet Take 1 tablet (81 mg total) by mouth daily. 90 tablet 3  . furosemide (LASIX) 20 MG tablet Take 1 tablet (20 mg total) by mouth daily. (Patient taking differently: Take 20-40 mg by mouth every other day. ) 30 tablet 1  . levothyroxine (SYNTHROID, LEVOTHROID) 50 MCG tablet Take 1 tablet (50 mcg total)  by mouth daily before breakfast. 30 tablet 11  . MULTIPLE VITAMIN PO Take 1 tablet by mouth daily.       Results for orders placed or performed during the hospital encounter of 10/18/14 (from the past 48 hour(s))  CBC with Differential     Status: Abnormal   Collection Time: 10/18/14  7:27 AM  Result Value Ref Range   WBC 5.3 4.0 - 10.5 K/uL   RBC 4.07 3.87 - 5.11 MIL/uL   Hemoglobin 10.2 (L) 12.0 - 15.0 g/dL   HCT 31.3 (L) 36.0 - 46.0 %   MCV 76.9 (L) 78.0 - 100.0 fL   MCH 25.1 (L) 26.0 - 34.0 pg   MCHC 32.6 30.0 - 36.0 g/dL   RDW 19.1 (H) 11.5 - 15.5 %   Platelets 266 150 - 400 K/uL   Neutrophils Relative % 51 43 - 77 %   Neutro Abs 2.7 1.7 - 7.7 K/uL   Lymphocytes Relative 38 12 - 46 %   Lymphs Abs 2.0 0.7 - 4.0 K/uL   Monocytes Relative 9 3 - 12 %   Monocytes Absolute 0.5 0.1 - 1.0 K/uL   Eosinophils Relative 2 0 - 5 %   Eosinophils Absolute 0.1 0.0 - 0.7 K/uL   Basophils Relative 0 0 - 1 %   Basophils Absolute 0.0 0.0 - 0.1 K/uL  Comprehensive metabolic panel     Status: Abnormal   Collection Time: 10/18/14  7:27 AM  Result Value Ref Range   Sodium 132 (L) 135 - 145 mmol/L    Comment: Please note change in reference range.   Potassium 4.0 3.5 - 5.1 mmol/L    Comment: Please note change in reference range.   Chloride 102 96 - 112 mEq/L   CO2 24 19 - 32 mmol/L   Glucose, Bld 102 (H) 70 - 99 mg/dL   BUN 16 6 - 23 mg/dL   Creatinine, Ser 0.70 0.50 - 1.10 mg/dL   Calcium  9.0 8.4 - 10.5 mg/dL   Total Protein 6.5 6.0 - 8.3 g/dL   Albumin 3.3 (L) 3.5 - 5.2 g/dL   AST 30 0 - 37 U/L   ALT 18 0 - 35 U/L   Alkaline Phosphatase 73 39 - 117 U/L   Total Bilirubin 0.4 0.3 - 1.2 mg/dL   GFR calc non Af Amer 75 (L) >90 mL/min   GFR calc Af Amer 87 (L) >90 mL/min    Comment: (NOTE) The eGFR has been calculated using the CKD EPI equation. This calculation has not been validated in all clinical situations. eGFR's persistently <90 mL/min signify possible Chronic Kidney Disease.    Anion gap 6 5 - 15  Brain natriuretic peptide     Status: Abnormal   Collection Time: 10/18/14  7:27 AM  Result Value Ref Range   B Natriuretic Peptide 609.0 (H) 0.0 - 100.0 pg/mL    Comment: Please note change in reference range.  Troponin I     Status: Abnormal   Collection Time: 10/18/14  7:27 AM  Result Value Ref Range   Troponin I 0.08 (H) <0.031 ng/mL    Comment:        PERSISTENTLY INCREASED TROPONIN VALUES IN THE RANGE OF 0.04-0.49 ng/mL CAN BE SEEN IN:       -UNSTABLE ANGINA       -CONGESTIVE HEART FAILURE       -MYOCARDITIS       -CHEST TRAUMA       -ARRYHTHMIAS       -  LATE PRESENTING MYOCARDIAL INFARCTION       -COPD   CLINICAL FOLLOW-UP RECOMMENDED. Please note change in reference range.    Dg Chest Port 1 View  10/18/2014   CLINICAL DATA:  Shortness of breath since midnight, history of atrial fibrillation  EXAM: PORTABLE CHEST - 1 VIEW  COMPARISON:  04/15/2014  FINDINGS: Cardiac shadow is stable. Postsurgical changes are again seen. The lungs are well aerated bilaterally without focal infiltrate or sizable effusion. No acute bony abnormality is noted.  IMPRESSION: No acute abnormality seen.   Electronically Signed   By: Inez Catalina M.D.   On: 10/18/2014 07:05    ROS: General:no colds or fevers, no weight changes, fatigue for several weeks Skin:no rashes or ulcers HEENT:no blurred vision, no congestion CV:see HPI PUL:see HPI GI:no diarrhea constipation or  melena, occ. Bright blood with hemorrhoids  no indigestion GU:no hematuria, no dysuria MS:no joint pain, no claudication Neuro:no syncope, no lightheadedness Endo:no diabetes, + thyroid disease   Blood pressure 100/64, pulse 127, temperature 97.5 F (36.4 C), temperature source Oral, resp. rate 20, height _0  (1.549 m), weight 110 lb (49.896 kg), SpO2 99 %. PE: General:Pleasant affect, NAD Skin:Warm and dry, brisk capillary refill HEENT:normocephalic, sclera clear, mucus membranes moist Neck:supple, no JVD, no bruits, no adenopathy  Heart:irreg irreg rapid without murmur, gallup, rub or click Lungs:crackles in the bases, no rhonchi, or wheezes HWE:XHBZ, non tender, + BS, do not palpate liver spleen or masses Ext:no lower ext edema, 2+ pedal pulses, + tenderness rt ankle 2+ radial pulses Neuro:alert and oriented X 3, MAE, follows commands, + facial symmetry    Assessment/Plan    Atrial fibrillation with RVR    There is only moderate increase in heart rate. It is not clear which came first, mild congestive heart failure or atrial fibrillation. 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 I wonder if she has been going in and out of atrial fibrillation. I have decided to start with IV amiodarone and probably convert to oral amiodarone to be used for at least a short-term. Her left atrial appendage was clipped at the time of surgery in 2014. The patient and her husband and Dr. [name] decided that they would stops her Xarelto. This had been used possibly for atrial fib and also for a postoperative deep venous thrombosis. I explained that we would like to resume Xarelto at this time since we are trying to convert her back to sinus rhythm. Everyone is in agreement.    Mitral regurgitation    The patient does have moderate mitral regurgitation by history. Hopefully she has not had an acute worsening.      Pulmonary hypertension, moderate to severe     At the time of her last echo her PA  pressure was 56 mmHg.    Acute on chronic diastolic CHF     The history suggests that she has had some developing CHF over the past one or 2 weeks. She takes intermittent Lasix at home. We will diurese her in the hospital and then continue regular Lasix after discharge.    Hx of CABG     2014    Hypothyroidism     The patient relates that hypothyroidism was related to amiodarone therapy. Amiodarone has been stopped. The patient's TSH has not been checked for greater than 6 months. This is being ordered today.    Chronic anticoagulation CHADS2VASC2 score 7     The patient's left atrial appendage was clipped  at the time of surgery in 2014. Dr. [name] and the family decided to discontinue Xarelto in the past 6 months. I am starting it again today as we are going to try to convert her atrial fib.    Elevated troponin    There is trace elevation of her troponin. I suspect this is related to her increased atrial fib rate. No further ischemic workup is planned at this time.  The overall plan will be to treat her atrial fibrillation and proceed with mild diuresis. If she converts rapidly and improves significantly from the volume viewpoint, she may not have to stay in the hospital for long time. Decision will have to be made tomorrow about changing IV amiodarone to oral amiodarone. I think that this would be very reasonable. It could be stopped by Dr. [name] later if he chooses.  [place] Nurse Practitioner Certified [place] Pager [phone] or after 5pm or weekends call [phone] 10/18/2014, 9:21 AM Patient seen and examined. I agree with the assessment and plan as detailed above. See also my additional thoughts below.   The entire assessment and plan above has been written by me. The plans are as outlined.  Dola Argyle, MD, [facility] 10/18/2014 11:18 AM

## 2014-10-18 NOTE — ED Notes (Signed)
Pt. Is aware of needing an urine specimen

## 2014-10-18 NOTE — ED Provider Notes (Signed)
CSN: [id number]     Arrival date & time 10/18/14  3716 History   First MD Initiated Contact with Patient 10/18/14 [phone]     Chief Complaint  Patient presents with  . Atrial Fibrillation     (Consider location/radiation/quality/duration/timing/severity/associated sxs/prior Treatment) Patient is a 80 y.o. female presenting with atrial fibrillation. The history is provided by the patient.  Atrial Fibrillation This is a recurrent problem. Episode onset: 2-3 days ago. The problem occurs constantly. The problem has been gradually worsening. Associated symptoms include chest pain (dull anxious feeling, not pain) and shortness of breath. Pertinent negatives include no abdominal pain and no headaches. Nothing aggravates the symptoms. Nothing relieves the symptoms. She has tried nothing for the symptoms. The treatment provided no relief.    Past Medical History  Diagnosis Date  . Osteoporosis   . Celiac disease   . Celiac disease   . Carotid artery occlusion     right carotid stenosis 40-60%, 4/08, neg for stenosis repeat study 11/11  . Depression   . Presbycusis   . Macular degeneration   . Urethral stricture   . Moderate aortic insufficiency   . Moderate mitral regurgitation by prior echocardiogram     echo 9/13   . Thyroid nodule     62m, no change 11/11  . RLS (restless legs syndrome)   . Vitamin D deficiency   . Heart murmur   . Pulmonary hypertension   . Hypertension     takes Metoprolol daily  . Atrial fibrillation   . Coronary artery disease   . CHF (congestive heart failure)     takes Lasix daily  . MVP (mitral valve prolapse)   . Moderate obstructive sleep apnea     uses CPAP;sleep study about 4-562monthago  . Hearing loss     both ears  . Numbness     left arm  . Joint swelling   . GERD (gastroesophageal reflux disease)     takes Omeprazole daily  . Gastric ulcer     6-60m37monthgo  . GI bleeding   . Constipation   . Diarrhea   . Hemorrhoids   . Urinary  retention   . Urinary anastomotic stricture   . Hypothyroidism     takes Synthroid daily as result of AMiodarone  . Hot flashes     takes ZOloft 3 times a week  . Insomnia     takes Melatonin nightly  . Restless leg   . Peripheral edema     left  . DVT (deep venous thrombosis)   . Atonic bladder 12/11/2013   Past Surgical History  Procedure Laterality Date  . Appendectomy    . Tubal ligation    . Tonsillectomy    . Trigger thumb    . Cardiac catheterization  08-06-13  . Tcs    . Bilateral cataract surgery    . Mandible surgery    . Coronary artery bypass graft N/A 08/28/2013    Procedure: CORONARY ARTERY BYPASS GRAFTING (CABG) x2 using right greater saphenous vein and left internal mammary artery. ;  Surgeon: EdwGrace IsaacD;  [facility];  Laterality: N/A;  . Intraoperative transesophageal echocardiogram N/A 08/28/2013    Procedure: INTRAOPERATIVE TRANSESOPHAGEAL ECHOCARDIOGRAM;  Surgeon: EdwGrace IsaacD;  [facility];  Laterality: N/A;  . Hip arthroplasty Right 12/09/2013    Procedure: ARTHROPLASTY BIPOLAR HIP CEMENTED;  Surgeon: FraKerin SalenD;  [facility];  Service: Orthopedics;  Laterality: Right;  . Left and right heart catheterization with coronary angiogram N/A 08/16/2013    Procedure: LEFT AND RIGHT HEART CATHETERIZATION WITH CORONARY ANGIOGRAM;  Surgeon: [name], MD;  Location: [facility] CATH LAB;  Service: Cardiovascular;  Laterality: N/A;   Family History  Problem Relation Age of Onset  . Heart attack Mother   . CVA Mother   . CAD Father   . Colon cancer Father   . Heart attack Father   . Heart attack Brother   . Peripheral vascular disease Brother   . AAA (abdominal aortic aneurysm) Brother    History  Substance Use Topics  . Smoking status: Never Smoker   . Smokeless tobacco: Never Used  . Alcohol Use: No   OB History    No data available     Review of Systems   Constitutional: Positive for fatigue. Negative for fever.  HENT: Negative for congestion and drooling.   Eyes: Negative for pain.  Respiratory: Positive for shortness of breath. Negative for cough.   Cardiovascular: Positive for chest pain (dull anxious feeling, not pain).  Gastrointestinal: Negative for nausea, vomiting, abdominal pain and diarrhea.  Genitourinary: Negative for dysuria and hematuria.  Musculoskeletal: Negative for back pain, gait problem and neck pain.  Skin: Negative for color change.  Neurological: Negative for dizziness and headaches.  Hematological: Negative for adenopathy.  Psychiatric/Behavioral: Negative for behavioral problems.  All other systems reviewed and are negative.     Allergies  Amoxicillin; Codeine; Fosamax; Gluten meal; Hctz; and Tetanus toxoids  Home Medications   Prior to Admission medications   Medication Sig Start Date End Date Taking? Authorizing Provider  acetaminophen (TYLENOL) 325 MG tablet Take 650 mg by mouth every 6 (six) hours as needed.   Yes Historical Provider, MD  aspirin EC 81 MG tablet Take 1 tablet (81 mg total) by mouth daily. 05/10/14  Yes Belva Crome III, MD  Cholecalciferol 1000 UNITS tablet Take 1,000 Units by mouth daily.   Yes Historical Provider, MD  furosemide (LASIX) 20 MG tablet Take 1 tablet (20 mg total) by mouth daily. Patient taking differently: Take 20-40 mg by mouth every other day.  01/03/14  Yes Daniel J Angiulli, PA-C  ibuprofen (ADVIL,MOTRIN) 200 MG tablet Take 400 mg by mouth every 6 (six) hours as needed for moderate pain.   Yes Historical Provider, MD  levothyroxine (SYNTHROID, LEVOTHROID) 50 MCG tablet Take 1 tablet (50 mcg total) by mouth daily before breakfast. 01/03/14  Yes Daniel J Angiulli, PA-C  MULTIPLE VITAMIN PO Take 1 tablet by mouth daily.   Yes Historical Provider, MD   BP 120/69 mmHg  Pulse 106  Temp(Src) 97.5 F (36.4 C) (Oral)  Resp 19  Ht 5' 1"  (1.549 m)  Wt 110 lb (49.896 kg)   BMI 20.80 kg/m2  SpO2 99% Physical Exam  Constitutional: She is oriented to person, place, and time. She appears well-developed and well-nourished.  HENT:  Head: Normocephalic and atraumatic.  Mouth/Throat: Oropharynx is clear and moist. No oropharyngeal exudate.  Eyes: Conjunctivae and EOM are normal. Pupils are equal, round, and reactive to light.  Neck: Normal range of motion. Neck supple.  Cardiovascular: Normal heart sounds and intact distal pulses.  Exam reveals no gallop and no friction rub.   No murmur heard. Afib, HR 120's  Pulmonary/Chest: Effort normal and breath sounds normal. No respiratory distress. She has no wheezes.  Abdominal: Soft. Bowel sounds are normal. There is no tenderness.  There is no rebound and no guarding.  Musculoskeletal: Normal range of motion. She exhibits no edema or tenderness.  Neurological: She is alert and oriented to person, place, and time.  Skin: Skin is warm and dry.  Psychiatric: She has a normal mood and affect. Her behavior is normal.  Nursing note and vitals reviewed.   ED Course  Procedures (including critical care time) Labs Review Labs Reviewed  CBC WITH DIFFERENTIAL - Abnormal; Notable for the following:    Hemoglobin 10.2 (*)    HCT 31.3 (*)    MCV 76.9 (*)    MCH 25.1 (*)    RDW 19.1 (*)    All other components within normal limits  COMPREHENSIVE METABOLIC PANEL - Abnormal; Notable for the following:    Sodium 132 (*)    Glucose, Bld 102 (*)    Albumin 3.3 (*)    GFR calc non Af Amer 75 (*)    GFR calc Af Amer 87 (*)    All other components within normal limits  BRAIN NATRIURETIC PEPTIDE - Abnormal; Notable for the following:    B Natriuretic Peptide 609.0 (*)    All other components within normal limits  TROPONIN I - Abnormal; Notable for the following:    Troponin I 0.08 (*)    All other components within normal limits  URINALYSIS, ROUTINE W REFLEX MICROSCOPIC - Abnormal; Notable for the following:    Hgb urine  dipstick TRACE (*)    Leukocytes, UA TRACE (*)    All other components within normal limits  URINE MICROSCOPIC-ADD ON  TSH    Imaging Review Dg Chest Port 1 View  10/18/2014   CLINICAL DATA:  Shortness of breath since midnight, history of atrial fibrillation  EXAM: PORTABLE CHEST - 1 VIEW  COMPARISON:  04/15/2014  FINDINGS: Cardiac shadow is stable. Postsurgical changes are again seen. The lungs are well aerated bilaterally without focal infiltrate or sizable effusion. No acute bony abnormality is noted.  IMPRESSION: No acute abnormality seen.   Electronically Signed   By: Inez Catalina M.D.   On: 10/18/2014 07:05     EKG Interpretation   Date/Time:  Friday October 18 2014 08:40:07 EST Ventricular Rate:  100 PR Interval:  149 QRS Duration: 109 QT Interval:  355 QTC Calculation: 458 R Axis:   -64 Text Interpretation:  Sinus tachycardia with irregular rate LVH with  secondary repolarization abnormality Inferior infarct, old Probable  anterior infarct, age indeterminate Confirmed by Chikita Dogan  MD, Yaquelin Langelier  ([id number]) on 10/18/2014 8:50:43 AM     CRITICAL CARE Performed by: Pamella Pert, S Total critical care time: 30 min Critical care time was exclusive of separately billable procedures and treating other patients. Critical care was necessary to treat or prevent imminent or life-threatening deterioration. Critical care was time spent personally by me on the following activities: development of treatment plan with patient and/or surrogate as well as nursing, discussions with consultants, evaluation of patient's response to treatment, examination of patient, obtaining history from patient or surrogate, ordering and performing treatments and interventions, ordering and review of laboratory studies, ordering and review of radiographic studies, pulse oximetry and re-evaluation of patient's condition.  MDM   Final diagnoses:  SOB (shortness of breath)  Atrial fibrillation with RVR     7:14 AM 80 y.o. female w hx of pulm htn, paroxysmal afib, CHF, CAD s/p CABG who presents with feeling rundown over the last few days. She states that she has been in A. fib. This morning  she noted that her heart rate was up in the 150s and she presents now for evaluation. She states that she has had some mild shortness of breath and a dull anxious feeling in her chest. She has had A. fib several times previously and spontaneously converted. Her husband notes the last time she had A. fib was approximately 1.5 years ago. She is not on any rate control medications. She is afebrile with a heart rate in the 120s on my exam. She just got a 10 mg bolus of diltiazem and her heart rate decreased to the 70s to 80s. We'll get screening lab work and imaging.  Cards consulted who will admit.   Pt w/ elevated trop. Mild sob here. Started on amiodarone gtt.   Pamella Pert, MD 10/18/14 [phone]

## 2014-10-18 NOTE — ED Notes (Signed)
Informed pt that we need a urine sample. Pt unable to go at this time.

## 2014-10-19 ENCOUNTER — Encounter (HOSPITAL_COMMUNITY): Payer: Self-pay | Admitting: Physician Assistant

## 2014-10-19 DIAGNOSIS — D649 Anemia, unspecified: Secondary | ICD-10-CM | POA: Diagnosis present

## 2014-10-19 DIAGNOSIS — I5032 Chronic diastolic (congestive) heart failure: Secondary | ICD-10-CM

## 2014-10-19 DIAGNOSIS — N183 Chronic kidney disease, stage 3 unspecified: Secondary | ICD-10-CM | POA: Diagnosis present

## 2014-10-19 LAB — CBC
HCT: 31.7 % — ABNORMAL LOW (ref 36.0–46.0)
HEMOGLOBIN: 10.5 g/dL — AB (ref 12.0–15.0)
MCH: 25.4 pg — ABNORMAL LOW (ref 26.0–34.0)
MCHC: 33.1 g/dL (ref 30.0–36.0)
MCV: 76.8 fL — ABNORMAL LOW (ref 78.0–100.0)
PLATELETS: 273 10*3/uL (ref 150–400)
RBC: 4.13 MIL/uL (ref 3.87–5.11)
RDW: 18.9 % — ABNORMAL HIGH (ref 11.5–15.5)
WBC: 5 10*3/uL (ref 4.0–10.5)

## 2014-10-19 LAB — BASIC METABOLIC PANEL
Anion gap: 5 (ref 5–15)
BUN: 13 mg/dL (ref 6–23)
CO2: 25 mmol/L (ref 19–32)
Calcium: 8.7 mg/dL (ref 8.4–10.5)
Chloride: 102 mEq/L (ref 96–112)
Creatinine, Ser: 0.81 mg/dL (ref 0.50–1.10)
GFR calc Af Amer: 73 mL/min — ABNORMAL LOW (ref >90)
GFR calc non Af Amer: 63 mL/min — ABNORMAL LOW (ref >90)
GLUCOSE: 94 mg/dL (ref 70–99)
POTASSIUM: 3.9 mmol/L (ref 3.5–5.1)
Sodium: 132 mmol/L — ABNORMAL LOW (ref 135–145)

## 2014-10-19 LAB — LIPID PANEL
CHOL/HDL RATIO: 3.2 ratio
CHOLESTEROL: 173 mg/dL (ref 0–200)
HDL: 54 mg/dL (ref >39)
LDL Cholesterol: 102 mg/dL — ABNORMAL HIGH (ref 0–99)
Triglycerides: 85 mg/dL (ref <150)
VLDL: 17 mg/dL (ref 0–40)

## 2014-10-19 LAB — T4, FREE: Free T4: 1.29 ng/dL (ref 0.80–1.80)

## 2014-10-19 LAB — TROPONIN I: Troponin I: 0.08 ng/mL — ABNORMAL HIGH (ref <0.031)

## 2014-10-19 MED ORDER — RIVAROXABAN 15 MG PO TABS: 15.0000 mg | ORAL_TABLET | Freq: Every day | ORAL | Status: DC

## 2014-10-19 MED ORDER — FUROSEMIDE 20 MG PO TABS: 20.0000 mg | ORAL_TABLET | ORAL | Status: DC

## 2014-10-19 MED ORDER — RIVAROXABAN 15 MG PO TABS
15.0000 mg | ORAL_TABLET | Freq: Every day | ORAL | Status: DC
  Filled 2014-10-19: qty 1

## 2014-10-19 MED ORDER — AMIODARONE HCL 200 MG PO TABS: 200.0000 mg | ORAL_TABLET | Freq: Two times a day (BID) | ORAL | Status: DC

## 2014-10-19 NOTE — Discharge Summary (Signed)
Discharge Summary   Patient ID: Maria [name] MRN: [id number], DOB/AGE: Dec 13, 1923 80 y.o. Admit date: 10/18/2014 D/C date:     10/19/2014  Primary Care Provider: Mathews Argyle, MD Primary Cardiologist: Maria [name]  Primary Discharge Diagnoses:  1. Paroxysmal atrial fibrillation admitted with RVR 2. Acute on chronic diastolic CHF (congestive heart failure) 3. Valvular disease to include mild AI, mod MR, mod TR by echo 10/2013 4. Pulmonary hypertension, moderate to severe by echo 10/2013 5. CAD s/p CABG 08/2013 with mildly elevated troponin felt 2/2 #1 6. Hypothyroidism 7. Hyponatremia 8. Chronic anemia 7. CKD based on CrCl - stage III  Past Medical History Diagnoses:  Past Medical History  Diagnosis Date  . Osteoporosis   . Celiac disease   . Carotid artery occlusion     right carotid stenosis 40-60%, 4/08, neg for stenosis repeat study 11/11  . Depression   . Presbycusis   . Macular degeneration   . Urethral stricture   . Mild aortic insufficiency     a. Echo 10/2013: mild AI.  Marland Kitchen Moderate mitral regurgitation by prior echocardiogram     a. Echo 10/2013.  Marland Kitchen Thyroid nodule     5m, no change 11/11  . RLS (restless legs syndrome)   . Vitamin D deficiency   . Pulmonary hypertension   . Hypertension   . PAF (paroxysmal atrial fibrillation)   . Coronary artery disease     a.  08/2013 (LIMA-LAD, rSVG to LCX and Lt atrial clip),  . Chronic diastolic CHF (congestive heart failure)     takes Lasix daily  . MVP (mitral valve prolapse)   . Moderate obstructive sleep apnea     uses CPAP;sleep study about 4-57monthago  . GERD (gastroesophageal reflux disease)     takes Omeprazole daily  . Gastric ulcer     6-39m17monthgo  . GI bleeding   . Hemorrhoids   . Urinary retention   . Urinary anastomotic stricture   . Hypothyroidism     takes Synthroid daily as result of AMiodarone  . Hot flashes     takes ZOloft 3 times a week  . Insomnia   . Peripheral edema     left  .  DVT (deep venous thrombosis) 09/2013  . Atonic bladder 12/11/2013  . Chronic anticoagulation CHADS2VASC2 score 7 10/18/2014  . Anemia   . CKD (chronic kidney disease), stage III    Hospital Course: [name] an [age]o F with history of CABGx2 08/2013 (LIMA-LAD, rSVG to LCX and Lt atrial clip), PAF, chronic diastolic CHF, history of DVT 09/2013, celiac disease, carotid artery disease, macular degeneration, pulm HTN, h/o gastric ulcer who presented to [name] with feeling shaky and nauseated. Per H/P, there are varying histories concerning whether she has actually had atrial fib since her bypass surgery. Her husband (retired intAdministrator, Maria [name] that she had been on amiodarone for 6 months at one point. This was stopped. He also said that it was around this time that she was noted to be hypothyroid. Per last OV 04/2014, her Xarelto was stopped and she was changed to aspirin as she wanted to be off anticoagulation therapy. She stopped after 6 months for DVT.Last 2D Echo 10/2013: Mild AI, mod MR, mod TR, PAP 42m85m Her AF has not been felt to be valvular in etiology.  For several weeks she had not been feeling as well as usual. She was feeling more tired and SOB with orthopnea. Her HR was in the 100s  and irregular - she tried staying home but did not rest well with SOB and restlessness.No chest pain.Upon presentation to the ED she was [name] to be in AF with mild increase in HR. She was treated with IV diltiazem. Dr. Ron [name] recommended to start with IV amiodarone since this has been used in the past. Given that rhythm control was being pursued back to NSR, Dr. Ron [name] also started her back on Xarelto and they were in agreement. CHADSVASC = 7. She was diuresed in the hospital. Labs were significant for hyponatremia 132, microcytic anemia Hgb 10.5, and TSH 2.57. Anemia is chronic though MCV was lower - pt asked to f/u PCP. CXR nonacute. There was mild troponin elevation felt related to increased atrial fib  rate (peak 0.08). No further ischemic workup was planned. On IV amiodarone she converted to NSR and was changed to oral amiodarone at 274m Maria [name]. Dr. BGwenlyn Foundrecommended to send home on alternating Lasix 440mwith 2052mhome dose). Dr. BerGwenlyn [name] seen and examined the patient today and feels she is stable for discharge. Further monitoring of organ systems while on amiodarone will be at discretion of primary cardiologist. I have left a message on our office's scheduling voicemail requesting a follow-up appointment, and our office will call the patient with this information.   Pt given the following information - "Patients taking Xarelto should generally stay away from medicines like ibuprofen, Advil, Motrin, naproxen, and Aleve due to risk of stomach bleeding. You may take Tylenol as directed or talk to your primary doctor about alternatives." She was started on Xarelto 61m11m the hospital but dose was reduced to 15mg25mpper at discharge given CrCl 38ml/63m Per d/w Dr. Berry Maria Founddiscontinue aspirin.  Discharge Vitals: Blood pressure 143/71, pulse 73, temperature 97.9 F (36.6 C), temperature source Oral, resp. rate 20, height 5' 1"  (1.549 m), weight 113 lb 12.1 oz (51.6 kg), SpO2 100 %.  Labs: Lab Results  Component Value Date   WBC 5.0 10/19/2014   HGB 10.5* 10/19/2014   HCT 31.7* 10/19/2014   MCV 76.8* 10/19/2014   PLT 273 10/19/2014    Recent Labs Lab 10/18/14 0727 10/19/14 0315  NA 132* 132*  K 4.0 3.9  CL 102 102  CO2 24 25  BUN 16 13  CREATININE 0.70 0.81  CALCIUM 9.0 8.7  PROT 6.5  --   BILITOT 0.4  --   ALKPHOS 73  --   ALT 18  --   AST 30  --   GLUCOSE 102* 94    Recent Labs  10/18/14 0727 10/18/14 1650 10/18/14 2140 10/19/14 0315  TROPONINI 0.08* 0.07* 0.06* 0.08*   Lab Results  Component Value Date   CHOL 173 10/19/2014   HDL 54 10/19/2014   LDLCALC 102* 10/19/2014   TRIG 85 10/19/2014    Diagnostic Studies/Procedures   Dg Chest Port 1 View  10/18/2014    CLINICAL DATA:  Shortness of breath since midnight, history of atrial fibrillation  EXAM: PORTABLE CHEST - 1 VIEW  COMPARISON:  04/15/2014  FINDINGS: Cardiac shadow is stable. Postsurgical changes are again seen. The lungs are well aerated bilaterally without focal infiltrate or sizable effusion. No acute bony abnormality is noted.  IMPRESSION: No acute abnormality seen.   Electronically Signed   By: Mark  Inez Catalina  On: 10/18/2014 07:05    Discharge Medications   Current Discharge Medication List    START taking these medications   Details  amiodarone (PACERONE) 200 MG tablet Take  1 tablet (200 mg total) by mouth 2 (two) times daily. Qty: 60 tablet, Refills: 2    Rivaroxaban (XARELTO) 15 MG TABS tablet Take 1 tablet (15 mg total) by mouth daily with supper. Qty: 30 tablet, Refills: 3      CONTINUE these medications which have CHANGED   Details  furosemide (LASIX) 20 MG tablet Take 1-2 tablets (20-40 mg total) by mouth every other day. Has not changed       CONTINUE these medications which have NOT CHANGED   Details  acetaminophen (TYLENOL) 325 MG tablet Take 650 mg by mouth every 6 (six) hours as needed.    Cholecalciferol 1000 UNITS tablet Take 1,000 Units by mouth daily.    levothyroxine (SYNTHROID, LEVOTHROID) 50 MCG tablet Take 1 tablet (50 mcg total) by mouth daily before breakfast.    MULTIPLE VITAMIN PO Take 1 tablet by mouth daily.      STOP taking these medications     aspirin EC 81 MG tablet      ibuprofen (ADVIL,MOTRIN) 200 MG tablet         Disposition   The patient will be discharged in stable condition to home. Discharge Instructions    Diet - low sodium heart healthy    Complete by:  As directed      Increase activity slowly    Complete by:  As directed   Patients taking Xarelto should generally stay away from medicines like ibuprofen, Advil, Motrin, naproxen, and Aleve due to risk of stomach bleeding. You may take Tylenol as directed or talk to  your primary doctor about alternatives.  We have stopped your aspirin.          Follow-up Information    Follow up with [name], MD.   Specialty:  Cardiology   Why:  Office will call you for your followup appointment. Call office if you have not heard back in 3 days.   Contact information:   [address] [place] [address] [phone]       Follow up with Maria Argyle, MD.   Specialty:  Internal Medicine   Why:  Your bloodwork showed mild anemia. Follow up with primary care for further monitoring.   Contact information:   [address] [place] [address] [phone]         Duration of Discharge Encounter: Greater than 30 minutes including physician and PA time.  Signed, Melina Copa PA-C 10/19/2014, 12:20 PM

## 2014-10-19 NOTE — Progress Notes (Signed)
Subjective:  No CP/SOB. Converted to NSR  Objective:  Temp:  [97.7 F (36.5 C)-97.9 F (36.6 C)] 97.9 F (36.6 C) (12/25 2104) Pulse Rate:  [69-142] 73 (12/26 0341) Resp:  [20-24] 20 (12/26 0341) BP: (96-143)/(56-71) 143/71 mmHg (12/26 0341) SpO2:  [98 %-100 %] 100 % (12/26 0341) Weight:  [113 lb 12.1 oz (51.6 kg)] 113 lb 12.1 oz (51.6 kg) (12/26 0341) Weight change: 3 lb 12.1 oz (1.704 kg)  Intake/Output from previous day: 12/25 0701 - 12/26 0700 In: 360 [P.O.:360] Out: -   Intake/Output from this shift:    Physical Exam: General appearance: alert and no distress Neck: no adenopathy, no carotid bruit, no JVD, supple, symmetrical, trachea midline and thyroid not enlarged, symmetric, no tenderness/mass/nodules Lungs: clear to auscultation bilaterally Heart: regular rate and rhythm, S1, S2 normal, no murmur, click, rub or gallop Extremities: extremities normal, atraumatic, no cyanosis or edema  Lab Results: Results for orders placed or performed during the hospital encounter of 10/18/14 (from the past 48 hour(s))  CBC with Differential     Status: Abnormal   Collection Time: 10/18/14  7:27 AM  Result Value Ref Range   WBC 5.3 4.0 - 10.5 K/uL   RBC 4.07 3.87 - 5.11 MIL/uL   Hemoglobin 10.2 (L) 12.0 - 15.0 g/dL   HCT 31.3 (L) 36.0 - 46.0 %   MCV 76.9 (L) 78.0 - 100.0 fL   MCH 25.1 (L) 26.0 - 34.0 pg   MCHC 32.6 30.0 - 36.0 g/dL   RDW 19.1 (H) 11.5 - 15.5 %   Platelets 266 150 - 400 K/uL   Neutrophils Relative % 51 43 - 77 %   Neutro Abs 2.7 1.7 - 7.7 K/uL   Lymphocytes Relative 38 12 - 46 %   Lymphs Abs 2.0 0.7 - 4.0 K/uL   Monocytes Relative 9 3 - 12 %   Monocytes Absolute 0.5 0.1 - 1.0 K/uL   Eosinophils Relative 2 0 - 5 %   Eosinophils Absolute 0.1 0.0 - 0.7 K/uL   Basophils Relative 0 0 - 1 %   Basophils Absolute 0.0 0.0 - 0.1 K/uL  Comprehensive metabolic panel     Status: Abnormal   Collection Time: 10/18/14  7:27 AM  Result Value Ref Range   Sodium  132 (L) 135 - 145 mmol/L    Comment: Please note change in reference range.   Potassium 4.0 3.5 - 5.1 mmol/L    Comment: Please note change in reference range.   Chloride 102 96 - 112 mEq/L   CO2 24 19 - 32 mmol/L   Glucose, Bld 102 (H) 70 - 99 mg/dL   BUN 16 6 - 23 mg/dL   Creatinine, Ser 0.70 0.50 - 1.10 mg/dL   Calcium 9.0 8.4 - 10.5 mg/dL   Total Protein 6.5 6.0 - 8.3 g/dL   Albumin 3.3 (L) 3.5 - 5.2 g/dL   AST 30 0 - 37 U/L   ALT 18 0 - 35 U/L   Alkaline Phosphatase 73 39 - 117 U/L   Total Bilirubin 0.4 0.3 - 1.2 mg/dL   GFR calc non Af Amer 75 (L) >90 mL/min   GFR calc Af Amer 87 (L) >90 mL/min    Comment: (NOTE) The eGFR has been calculated using the CKD EPI equation. This calculation has not been validated in all clinical situations. eGFR's persistently <90 mL/min signify possible Chronic Kidney Disease.    Anion gap 6 5 - 15  Brain natriuretic  peptide     Status: Abnormal   Collection Time: 10/18/14  7:27 AM  Result Value Ref Range   B Natriuretic Peptide 609.0 (H) 0.0 - 100.0 pg/mL    Comment: Please note change in reference range.  Troponin I     Status: Abnormal   Collection Time: 10/18/14  7:27 AM  Result Value Ref Range   Troponin I 0.08 (H) <0.031 ng/mL    Comment:        PERSISTENTLY INCREASED TROPONIN VALUES IN THE RANGE OF 0.04-0.49 ng/mL CAN BE SEEN IN:       -UNSTABLE ANGINA       -CONGESTIVE HEART FAILURE       -MYOCARDITIS       -CHEST TRAUMA       -ARRYHTHMIAS       -LATE PRESENTING MYOCARDIAL INFARCTION       -COPD   CLINICAL FOLLOW-UP RECOMMENDED. Please note change in reference range.   Urinalysis, Routine w reflex microscopic     Status: Abnormal   Collection Time: 10/18/14  9:28 AM  Result Value Ref Range   Color, Urine YELLOW YELLOW   APPearance CLEAR CLEAR   Specific Gravity, Urine 1.009 1.005 - 1.030   pH 6.5 5.0 - 8.0   Glucose, UA NEGATIVE NEGATIVE mg/dL   Hgb urine dipstick TRACE (A) NEGATIVE   Bilirubin Urine NEGATIVE  NEGATIVE   Ketones, ur NEGATIVE NEGATIVE mg/dL   Protein, ur NEGATIVE NEGATIVE mg/dL   Urobilinogen, UA 0.2 0.0 - 1.0 mg/dL   Nitrite NEGATIVE NEGATIVE   Leukocytes, UA TRACE (A) NEGATIVE  Urine microscopic-add on     Status: None   Collection Time: 10/18/14  9:28 AM  Result Value Ref Range   Squamous Epithelial / LPF RARE RARE   WBC, UA 0-2 <3 WBC/hpf   RBC / HPF 0-2 <3 RBC/hpf   Bacteria, UA RARE RARE  TSH     Status: None   Collection Time: 10/18/14  1:08 PM  Result Value Ref Range   TSH 2.482 0.350 - 4.500 uIU/mL  TSH     Status: None   Collection Time: 10/18/14  4:22 PM  Result Value Ref Range   TSH 2.573 0.350 - 4.500 uIU/mL  Magnesium     Status: None   Collection Time: 10/18/14  4:50 PM  Result Value Ref Range   Magnesium 2.0 1.5 - 2.5 mg/dL  T4, free     Status: None   Collection Time: 10/18/14  4:50 PM  Result Value Ref Range   Free T4 1.29 0.80 - 1.80 ng/dL    Comment: Performed at [facility]  Troponin I-(serum)     Status: Abnormal   Collection Time: 10/18/14  4:50 PM  Result Value Ref Range   Troponin I 0.07 (H) <0.031 ng/mL    Comment:        PERSISTENTLY INCREASED TROPONIN VALUES IN THE RANGE OF 0.04-0.49 ng/mL CAN BE SEEN IN:       -UNSTABLE ANGINA       -CONGESTIVE HEART FAILURE       -MYOCARDITIS       -CHEST TRAUMA       -ARRYHTHMIAS       -LATE PRESENTING MYOCARDIAL INFARCTION       -COPD   CLINICAL FOLLOW-UP RECOMMENDED. Please note change in reference range.   Protime-INR     Status: Abnormal   Collection Time: 10/18/14  4:50 PM  Result Value Ref Range   Prothrombin Time  20.2 (H) 11.6 - 15.2 seconds   INR 1.71 (H) 0.00 - 1.49  APTT     Status: Abnormal   Collection Time: 10/18/14  4:50 PM  Result Value Ref Range   aPTT 44 (H) 24 - 37 seconds    Comment:        IF BASELINE aPTT IS ELEVATED, SUGGEST PATIENT RISK ASSESSMENT BE USED TO DETERMINE APPROPRIATE ANTICOAGULANT THERAPY.   Troponin I-(serum)     Status: Abnormal    Collection Time: 10/18/14  9:40 PM  Result Value Ref Range   Troponin I 0.06 (H) <0.031 ng/mL    Comment:        PERSISTENTLY INCREASED TROPONIN VALUES IN THE RANGE OF 0.04-0.49 ng/mL CAN BE SEEN IN:       -UNSTABLE ANGINA       -CONGESTIVE HEART FAILURE       -MYOCARDITIS       -CHEST TRAUMA       -ARRYHTHMIAS       -LATE PRESENTING MYOCARDIAL INFARCTION       -COPD   CLINICAL FOLLOW-UP RECOMMENDED. Please note change in reference range.   Troponin I-(serum)     Status: Abnormal   Collection Time: 10/19/14  3:15 AM  Result Value Ref Range   Troponin I 0.08 (H) <0.031 ng/mL    Comment:        PERSISTENTLY INCREASED TROPONIN VALUES IN THE RANGE OF 0.04-0.49 ng/mL CAN BE SEEN IN:       -UNSTABLE ANGINA       -CONGESTIVE HEART FAILURE       -MYOCARDITIS       -CHEST TRAUMA       -ARRYHTHMIAS       -LATE PRESENTING MYOCARDIAL INFARCTION       -COPD   CLINICAL FOLLOW-UP RECOMMENDED. Please note change in reference range.   Basic metabolic panel     Status: Abnormal   Collection Time: 10/19/14  3:15 AM  Result Value Ref Range   Sodium 132 (L) 135 - 145 mmol/L    Comment: Please note change in reference range.   Potassium 3.9 3.5 - 5.1 mmol/L    Comment: Please note change in reference range.   Chloride 102 96 - 112 mEq/L   CO2 25 19 - 32 mmol/L   Glucose, Bld 94 70 - 99 mg/dL   BUN 13 6 - 23 mg/dL   Creatinine, Ser 0.81 0.50 - 1.10 mg/dL   Calcium 8.7 8.4 - 10.5 mg/dL   GFR calc non Af Amer 63 (L) >90 mL/min   GFR calc Af Amer 73 (L) >90 mL/min    Comment: (NOTE) The eGFR has been calculated using the CKD EPI equation. This calculation has not been validated in all clinical situations. eGFR's persistently <90 mL/min signify possible Chronic Kidney Disease.    Anion gap 5 5 - 15  Lipid panel     Status: Abnormal   Collection Time: 10/19/14  3:15 AM  Result Value Ref Range   Cholesterol 173 0 - 200 mg/dL   Triglycerides 85 <150 mg/dL   HDL 54 >39 mg/dL   Total  CHOL/HDL Ratio 3.2 RATIO   VLDL 17 0 - 40 mg/dL   LDL Cholesterol 102 (H) 0 - 99 mg/dL    Comment:        Total Cholesterol/HDL:CHD Risk Coronary Heart Disease Risk Table  Men   Women  1/2 Average Risk   3.4   3.3  Average Risk       5.0   4.4  2 X Average Risk   9.6   7.1  3 X Average Risk  23.4   11.0        Use the calculated Patient Ratio above and the CHD Risk Table to determine the patient's CHD Risk.        ATP III CLASSIFICATION (LDL):  <100     mg/dL   Optimal  100-129  mg/dL   Near or Above                    Optimal  130-159  mg/dL   Borderline  160-189  mg/dL   High  >190     mg/dL   Very High   CBC     Status: Abnormal   Collection Time: 10/19/14  3:15 AM  Result Value Ref Range   WBC 5.0 4.0 - 10.5 K/uL   RBC 4.13 3.87 - 5.11 MIL/uL   Hemoglobin 10.5 (L) 12.0 - 15.0 g/dL   HCT 31.7 (L) 36.0 - 46.0 %   MCV 76.8 (L) 78.0 - 100.0 fL   MCH 25.4 (L) 26.0 - 34.0 pg   MCHC 33.1 30.0 - 36.0 g/dL   RDW 18.9 (H) 11.5 - 15.5 %   Platelets 273 150 - 400 K/uL    Imaging: Imaging results have been reviewed  Tele: NSR  Assessment/Plan:   1. Principal Problem: 2.   Atrial fibrillation with RVR 3. Active Problems: 4.   Paroxysmal atrial fibrillation 5.   Mitral regurgitation 6.   Pulmonary hypertension, moderate to severe 7.   Hx of CABG 8.   Hypothyroidism 9.   Chronic anticoagulation CHADS2VASC2 score 7 10.   Elevated troponin 11.   Acute on chronic diastolic CHF (congestive heart failure) 12.   Time Spent Directly with Patient:  20 minutes  Length of Stay:  LOS: 1 day   Pt with PAF converted to NSR with iv---->PO amio. Feels clinically improved. Agree with starting Xarelto/ Exam benign. On PO lasix 40 alt w/ 20. Can be D/Cd home ROV with [place] [facility] 10/19/2014, 11:29 AM

## 2014-10-19 NOTE — Progress Notes (Signed)
Discharged to home with family office visits in place teaching done Discharged to home with family office visits in place teaching done

## 2014-10-24 ENCOUNTER — Ambulatory Visit (INDEPENDENT_AMBULATORY_CARE_PROVIDER_SITE_OTHER): Payer: Medicare Other | Admitting: Cardiology

## 2014-10-24 ENCOUNTER — Encounter: Payer: Self-pay | Admitting: Cardiology

## 2014-10-24 VITALS — BP 150/70 | HR 62 | Ht 61.0 in | Wt 112.0 lb

## 2014-10-24 DIAGNOSIS — R079 Chest pain, unspecified: Secondary | ICD-10-CM

## 2014-10-24 DIAGNOSIS — I2510 Atherosclerotic heart disease of native coronary artery without angina pectoris: Secondary | ICD-10-CM | POA: Diagnosis not present

## 2014-10-24 MED ORDER — AMIODARONE HCL 200 MG PO TABS: 200.0000 mg | ORAL_TABLET | Freq: Every day | ORAL | Status: DC

## 2014-10-24 NOTE — Progress Notes (Signed)
10/24/2014 Maria [name]   12/08/1923  [id number]  Primary Physician Maria Argyle, MD Primary Cardiologist: Dr. [name]  HPI:  The patient is a 80 y/o female, followed by Dr. [name], with a h/o CAD s/p CABG 08/2013 by Dr. Servando [name], chronic diastolic CHF, H/o DVT and PAF for which she takes Xarelto. She presents to clinic today for post hospital follow-up. She was admitted to [facility] December 25 through December 26 for symptomatic atrial fibrillation with RVR. She required IV amiodarone and converted to normal sinus rhythm. The decision was made to continue her on  PO amiodarone. She was discharged home on 200 mg twice a day.  Since discharge from the hospital, she denies any breakthrough atrial fibrillation. However she complains of chronic fatigue and also mild nausea since being discharged from the hospital. She attributes this to the amiodarone. Her husband who is also a retired physician also believes that Amiodarone may be contributing to her symptoms. She denies any signs of abnormal bleeding with  Xarelto including no melena or hematochezia. She also denies any falls. She does have a history of anemia in the past with hemoglobins ranging from 9-10 range. She reports that she has a follow-up appointment with her PCP in 2 weeks and states that she will have a repeat CBC done at that time. Her hemoglobin when she was discharged from the hospital was 10.8. She also denies any chest pain or dyspnea. She reports full medication compliance.  In clinic today, her EKG demonstrates normal sinus rhythm. Her heart rate is well controlled at 62 bpm. Her blood pressure is also mildly elevated but stable at 150/70.    Current Outpatient Prescriptions  Medication Sig Dispense Refill  . acetaminophen (TYLENOL) 325 MG tablet Take 650 mg by mouth every 6 (six) hours as needed.    Marland Kitchen amiodarone (PACERONE) 200 MG tablet Take 1 tablet (200 mg total) by mouth 2 (two) times daily. 60 tablet  2  . Cholecalciferol 1000 UNITS tablet Take 1,000 Units by mouth daily.    . furosemide (LASIX) 20 MG tablet Take 1-2 tablets (20-40 mg total) by mouth every other day.    . levothyroxine (SYNTHROID, LEVOTHROID) 50 MCG tablet Take 1 tablet (50 mcg total) by mouth daily before breakfast. 30 tablet 11  . MULTIPLE VITAMIN PO Take 1 tablet by mouth daily.    . Rivaroxaban (XARELTO) 15 MG TABS tablet Take 1 tablet (15 mg total) by mouth daily with supper. 30 tablet 3   No current facility-administered medications for this visit.    Allergies  Allergen Reactions  . Amoxicillin Other (See Comments)    Nausea, dizziness  . Codeine Nausea And Vomiting  . Fosamax [Alendronate Sodium] Other (See Comments)    Jaw pain  . Gluten Meal Other (See Comments)    Celiac Disease  . Hctz [Hydrochlorothiazide] Other (See Comments)    Hyponatremia, GI upset  . Tetanus Toxoids Other (See Comments)    Bad local reaction    History   Social History  . Marital Status: Married    Spouse Name: N/A    Number of Children: N/A  . Years of Education: N/A   Occupational History  . Not on file.   Social History Main Topics  . Smoking status: Never Smoker   . Smokeless tobacco: Never Used  . Alcohol Use: No  . Drug Use: No  . Sexual Activity: Yes    Birth Control/ Protection: Surgical   Other Topics Concern  .  Not on file   Social History Narrative     Review of Systems: General: negative for chills, fever, night sweats or weight changes.  Cardiovascular: negative for chest pain, dyspnea on exertion, edema, orthopnea, palpitations, paroxysmal nocturnal dyspnea or shortness of breath Dermatological: negative for rash Respiratory: negative for cough or wheezing Urologic: negative for hematuria Abdominal: negative for nausea, vomiting, diarrhea, bright red blood per rectum, melena, or hematemesis Neurologic: negative for visual changes, syncope, or dizziness All other systems reviewed and are  otherwise negative except as noted above.    Blood pressure 150/70, pulse 62, height 5' 1"  (1.549 m), weight 112 lb (50.803 kg).  General appearance: alert, cooperative and no distress Neck: no carotid bruit and no JVD Lungs: clear to auscultation bilaterally Heart: regular rate and rhythm, S1, S2 normal, no murmur, click, rub or gallop Extremities: no LEE Pulses: 2+ and symmetric Skin: warm and dry Neurologic: Grossly normal  EKG normal sinus rhythm. Heart rate 62 bpm.  ASSESSMENT AND PLAN:   1. Paroxysmal atrial fibrillation: EKG demonstrates normal sinus rhythm. Heart rate 62 bpm. Since discharge from the hospital, she denies any breakthrough atrial fibrillation. She has been maintained on 200 mg of amiodarone twice a day however she feels that the amiodarone is contributing to symptoms of fatigue as well as nausea. After detailed discussion, we have decided to decrease her amiodarone down to 200 mg once daily. She wishes to come off of this completely however I feel that this is best determined by her primary cardiologist, Dr. [name]. It may be reasonable to adopt a rate control strategy with a beta blocker and/or Cardizem. She has normal LV function as last 2-D echocardiogram revealed an EF of 60-65%. In addition she has a history of CAD, status post CABG surgery 1 year ago. Thus she may benefit from beta blocker therapy.   2. Chronic anticoagulation with Xarelto: She denies any abnormal bleeding. No history of falls. Patient was instructed to notify our office immediately of any abnormal bleeding or any falls, particularly those that result in head trauma. She verbalized understanding.  3. CAD: History of CABG surgery 08/2013. She denies any anginal symptoms. Continue medical therapy.  4. Anemia: This may also be contributory to her symptoms. Review of past CBCs reveal that she has had chronic stable anemia over the past year. She denies any abnormal bleeding while on Xarelto. I  recommended checking a CBC today, however she states that she wishes to wait until she sees her PCP in 2 weeks to have blood work drawn.  PLAN  follow-up with Dr. [name] in 4 weeks for reevaluation.  SIMMONS, BRITTAINYPA-C 10/24/2014 11:44 AM

## 2014-10-24 NOTE — Patient Instructions (Signed)
Your physician recommends that you schedule a follow-up appointment as you are already scheduled  Decrease your amiodarone to 200 mg daily  Wt. Daily

## 2014-11-07 DIAGNOSIS — I10 Essential (primary) hypertension: Secondary | ICD-10-CM | POA: Diagnosis not present

## 2014-11-07 DIAGNOSIS — L84 Corns and callosities: Secondary | ICD-10-CM | POA: Diagnosis not present

## 2014-11-07 DIAGNOSIS — K900 Celiac disease: Secondary | ICD-10-CM | POA: Diagnosis not present

## 2014-11-07 DIAGNOSIS — G4700 Insomnia, unspecified: Secondary | ICD-10-CM | POA: Diagnosis not present

## 2014-11-07 DIAGNOSIS — I4891 Unspecified atrial fibrillation: Secondary | ICD-10-CM | POA: Diagnosis not present

## 2014-11-29 ENCOUNTER — Encounter: Payer: Self-pay | Admitting: Interventional Cardiology

## 2014-11-29 ENCOUNTER — Ambulatory Visit (INDEPENDENT_AMBULATORY_CARE_PROVIDER_SITE_OTHER): Payer: Medicare Other | Admitting: Interventional Cardiology

## 2014-11-29 VITALS — BP 142/54 | HR 67 | Ht 61.0 in | Wt 113.2 lb

## 2014-11-29 DIAGNOSIS — I779 Disorder of arteries and arterioles, unspecified: Secondary | ICD-10-CM | POA: Diagnosis not present

## 2014-11-29 DIAGNOSIS — I10 Essential (primary) hypertension: Secondary | ICD-10-CM

## 2014-11-29 DIAGNOSIS — N183 Chronic kidney disease, stage 3 unspecified: Secondary | ICD-10-CM

## 2014-11-29 DIAGNOSIS — Z951 Presence of aortocoronary bypass graft: Secondary | ICD-10-CM

## 2014-11-29 DIAGNOSIS — I480 Paroxysmal atrial fibrillation: Secondary | ICD-10-CM | POA: Diagnosis not present

## 2014-11-29 DIAGNOSIS — I340 Nonrheumatic mitral (valve) insufficiency: Secondary | ICD-10-CM

## 2014-11-29 DIAGNOSIS — I739 Peripheral vascular disease, unspecified: Secondary | ICD-10-CM

## 2014-11-29 DIAGNOSIS — Z79899 Other long term (current) drug therapy: Secondary | ICD-10-CM

## 2014-11-29 NOTE — Progress Notes (Signed)
Patient ID: Maria [name], female   DOB: [birth year], 81 y.o.   MRN: [id number]    Cardiology Office Note   Date:  11/29/2014   ID:  Maria [name], DOB 07/09/1924, MRN [id number]  PCP:  Mathews Argyle, MD  Cardiologist:   [name], MD   No chief complaint on file.     History of Present Illness: Maria [name] is a 81 y.o. female who presents for CAD and MVR. Has PAF lasting minutes and occurring very rarely. She was admitted to the hospital in December with recurrent prolonged atrial fibrillation at which time amiodarone and Xarelto were restarted Decreased energy and shoulder pain with ironing. She denies discomfort similar to that that was occurring prior to bypass surgery.   Past Medical History  Diagnosis Date  . Osteoporosis   . Celiac disease   . Carotid artery occlusion     right carotid stenosis 40-60%, 4/08, neg for stenosis repeat study 11/11  . Depression   . Presbycusis   . Macular degeneration   . Urethral stricture   . Mild aortic insufficiency     a. Echo 10/2013: mild AI.  Marland Kitchen Moderate mitral regurgitation by prior echocardiogram     a. Echo 10/2013.  Marland Kitchen Thyroid nodule     39m, no change 11/11  . RLS (restless legs syndrome)   . Vitamin D deficiency   . Pulmonary hypertension   . Hypertension   . PAF (paroxysmal atrial fibrillation)   . Coronary artery disease     a.  08/2013 (LIMA-LAD, rSVG to LCX and Lt atrial clip),  . Chronic diastolic CHF (congestive heart failure)     takes Lasix daily  . MVP (mitral valve prolapse)   . Moderate obstructive sleep apnea     uses CPAP;sleep study about 4-584monthago  . GERD (gastroesophageal reflux disease)     takes Omeprazole daily  . Gastric ulcer     6-37m31monthgo  . GI bleeding   . Hemorrhoids   . Urinary retention   . Urinary anastomotic stricture   . Hypothyroidism     takes Synthroid daily as result of AMiodarone  . Hot flashes     takes ZOloft 3 times a week  . Insomnia   .  Peripheral edema     left  . DVT (deep venous thrombosis) 09/2013  . Atonic bladder 12/11/2013  . Chronic anticoagulation CHADS2VASC2 score 7 10/18/2014  . Anemia   . CKD (chronic kidney disease), stage III     Past Surgical History  Procedure Laterality Date  . Appendectomy    . Tubal ligation    . Tonsillectomy    . Trigger thumb    . Cardiac catheterization  08-06-13  . Tcs    . Bilateral cataract surgery    . Mandible surgery    . Coronary artery bypass graft N/A 08/28/2013    Procedure: CORONARY ARTERY BYPASS GRAFTING (CABG) x2 using right greater saphenous vein and left internal mammary artery. ;  Surgeon: EdwGrace IsaacD;  [facility];  Laterality: N/A;  . Intraoperative transesophageal echocardiogram N/A 08/28/2013    Procedure: INTRAOPERATIVE TRANSESOPHAGEAL ECHOCARDIOGRAM;  Surgeon: EdwGrace IsaacD;  [facility];  Laterality: N/A;  . Hip arthroplasty Right 12/09/2013    Procedure: ARTHROPLASTY BIPOLAR HIP CEMENTED;  Surgeon: FraKerin SalenD;  [facility];  Laterality: Right;  . Left and  right heart catheterization with coronary angiogram N/A 08/16/2013    Procedure: LEFT AND RIGHT HEART CATHETERIZATION WITH CORONARY ANGIOGRAM;  Surgeon: [name], MD;  Location[facility] CATH LAB;  Service: Cardiovascular;  Laterality: N/A;     Current Outpatient Prescriptions  Medication Sig Dispense Refill  . acetaminophen (TYLENOL) 325 MG tablet Take 650 mg by mouth every 6 (six) hours as needed.    Marland Kitchen amiodarone (PACERONE) 200 MG tablet Take 1 tablet (200 mg total) by mouth daily. 60 tablet 2  . Cholecalciferol 1000 UNITS tablet Take 1,000 Units by mouth daily.    . furosemide (LASIX) 20 MG tablet Take 1-2 tablets (20-40 mg total) by mouth every other day.    . levothyroxine (SYNTHROID, LEVOTHROID) 50 MCG tablet Take 1 tablet (50 mcg total) by mouth daily before breakfast. 30  tablet 11  . MULTIPLE VITAMIN PO Take 1 tablet by mouth daily.    . polyethylene glycol (MIRALAX / GLYCOLAX) packet Take 17 g by mouth daily.    . Rivaroxaban (XARELTO) 15 MG TABS tablet Take 1 tablet (15 mg total) by mouth daily with supper. 30 tablet 3  . zolpidem (AMBIEN) 5 MG tablet Take 5 mg by mouth at bedtime as needed for sleep.      No current facility-administered medications for this visit.    Allergies:   Amoxicillin; Codeine; Fosamax; Gluten meal; Hctz; and Tetanus toxoids    Social History:  The patient  reports that she has never smoked. She has never used smokeless tobacco. She reports that she does not drink alcohol or use illicit drugs.   Family History:  The patient's family history includes AAA (abdominal aortic aneurysm) in her brother; CAD in her father; CVA in her mother; Colon cancer in her father; Heart attack in her brother, father, and mother; Peripheral vascular disease in her brother.    ROS:  Please see the history of present illness.   Otherwise, review of systems are positive for neck discomfort and decreased energy.   All other systems are reviewed and negative.    PHYSICAL EXAM: VS:  BP 142/54 mmHg  Pulse 67  Ht 5' 1"  (1.549 m)  Wt 113 lb 3.2 oz (51.347 kg)  BMI 21.40 kg/m2  SpO2 98% , BMI Body mass index is 21.4 kg/(m^2). GEN: Well nourished, well developed, in no acute distress HEENT: normal Neck: no JVD, carotid bruits, or masses Cardiac: RRR; no murmurs, rubs, or gallops,no edema  Respiratory:  clear to auscultation bilaterally, normal work of breathing GI: soft, nontender, nondistended, + BS MS: no deformity or atrophy Skin: warm and dry, no rash Neuro:  Strength and sensation are intact Psych: euthymic mood, full affect   EKG:  EKG is not ordered today. The ekg ordered today demonstrates    Recent Labs: 10/18/2014: ALT 18; B Natriuretic Peptide 609.0*; Magnesium 2.0; TSH 2.573 10/19/2014: BUN 13; Creatinine 0.81; Hemoglobin 10.5*;  Platelets 273; Potassium 3.9; Sodium 132*    Lipid Panel    Component Value Date/Time   CHOL 173 10/19/2014 0315   TRIG 85 10/19/2014 0315   HDL 54 10/19/2014 0315   CHOLHDL 3.2 10/19/2014 0315   VLDL 17 10/19/2014 0315   LDLCALC 102* 10/19/2014 0315      Wt Readings from Last 3 Encounters:  11/29/14 113 lb 3.2 oz (51.347 kg)  10/24/14 112 lb (50.803 kg)  10/19/14 113 lb 12.1 oz (51.6 kg)      Other studies Reviewed: Additional studies/ records that were reviewed  today include: Marland Kitchen Viewed recent hospitalization Review of the above records demonstrates:    ASSESSMENT AND PLAN:  1.  PAF, now in NSR, on amiodarone., With no evidence of volume overload 2. Chronic diastolic heart failure 3. Hypertension essential, under good control 4. Coronary artery disease stable without angina. Chest discomfort is musculoskeletal.   Current medicines are reviewed at length with the patient today.  The patient has concerns regarding medicines. She would like to discontinue amiodarone and Xarelto. He was explained that these medications are likely to be chronic and  The following changes have been made:  no change  Labs/ tests ordered today include: Will need a TSH, hepatic panel, and chest x-ray on return in 6 months.  No orders of the defined types were placed in this encounter.     Disposition:   FU with Linard Millers in 6 months   Signed, [name], MD  11/29/2014 11:53 AM    [address], [place], [place]  [address] Phone: [phone]; Fax: [phone]

## 2014-11-29 NOTE — Patient Instructions (Addendum)
Your physician recommends that you continue on your current medications as directed. Please refer to the Current Medication list given to you today.  A chest x-ray takes a picture of the organs and structures inside the chest, including the heart, lungs, and blood vessels. This test can show several things, including, whether the heart is enlarges; whether fluid is building up in the lungs; and whether pacemaker / defibrillator leads are still in place.(In 6 months)  Your physician recommends that you return for lab work in: 6 months (Tsh,Hepatic)    Your physician wants you to follow-up in: 6 months with Dr.Smith You will receive a reminder letter in the mail two months in advance. If you don't receive a letter, please call our office to schedule the follow-up appointment.

## 2014-12-17 ENCOUNTER — Other Ambulatory Visit: Payer: Self-pay | Admitting: Interventional Cardiology

## 2014-12-18 ENCOUNTER — Other Ambulatory Visit: Payer: Self-pay | Admitting: *Deleted

## 2014-12-18 MED ORDER — RIVAROXABAN 15 MG PO TABS: 15.0000 mg | ORAL_TABLET | Freq: Every day | ORAL | Status: DC

## 2014-12-18 NOTE — Telephone Encounter (Signed)
Ok to refill for patient? Please advise. Thanks, MI

## 2015-01-09 DIAGNOSIS — F419 Anxiety disorder, unspecified: Secondary | ICD-10-CM | POA: Diagnosis not present

## 2015-01-09 DIAGNOSIS — F410 Panic disorder [episodic paroxysmal anxiety] without agoraphobia: Secondary | ICD-10-CM | POA: Diagnosis not present

## 2015-01-24 DIAGNOSIS — F4320 Adjustment disorder, unspecified: Secondary | ICD-10-CM | POA: Diagnosis not present

## 2015-01-29 DIAGNOSIS — F4320 Adjustment disorder, unspecified: Secondary | ICD-10-CM | POA: Diagnosis not present

## 2015-02-05 DIAGNOSIS — F4320 Adjustment disorder, unspecified: Secondary | ICD-10-CM | POA: Diagnosis not present

## 2015-02-10 DIAGNOSIS — I10 Essential (primary) hypertension: Secondary | ICD-10-CM | POA: Diagnosis not present

## 2015-02-10 DIAGNOSIS — F419 Anxiety disorder, unspecified: Secondary | ICD-10-CM | POA: Diagnosis not present

## 2015-02-10 DIAGNOSIS — K5900 Constipation, unspecified: Secondary | ICD-10-CM | POA: Diagnosis not present

## 2015-02-10 DIAGNOSIS — Z79899 Other long term (current) drug therapy: Secondary | ICD-10-CM | POA: Diagnosis not present

## 2015-02-10 DIAGNOSIS — L84 Corns and callosities: Secondary | ICD-10-CM | POA: Diagnosis not present

## 2015-02-10 DIAGNOSIS — R251 Tremor, unspecified: Secondary | ICD-10-CM | POA: Diagnosis not present

## 2015-02-20 DIAGNOSIS — F4320 Adjustment disorder, unspecified: Secondary | ICD-10-CM | POA: Diagnosis not present

## 2015-02-24 DIAGNOSIS — F4320 Adjustment disorder, unspecified: Secondary | ICD-10-CM | POA: Diagnosis not present

## 2015-03-03 ENCOUNTER — Ambulatory Visit (INDEPENDENT_AMBULATORY_CARE_PROVIDER_SITE_OTHER): Payer: Medicare Other | Admitting: Interventional Cardiology

## 2015-03-03 ENCOUNTER — Encounter: Payer: Self-pay | Admitting: Interventional Cardiology

## 2015-03-03 VITALS — BP 124/54 | HR 77 | Ht 61.0 in | Wt 108.8 lb

## 2015-03-03 DIAGNOSIS — I480 Paroxysmal atrial fibrillation: Secondary | ICD-10-CM | POA: Diagnosis not present

## 2015-03-03 DIAGNOSIS — I270 Primary pulmonary hypertension: Secondary | ICD-10-CM

## 2015-03-03 DIAGNOSIS — I10 Essential (primary) hypertension: Secondary | ICD-10-CM

## 2015-03-03 DIAGNOSIS — Z951 Presence of aortocoronary bypass graft: Secondary | ICD-10-CM

## 2015-03-03 DIAGNOSIS — I495 Sick sinus syndrome: Secondary | ICD-10-CM

## 2015-03-03 DIAGNOSIS — I272 Pulmonary hypertension, unspecified: Secondary | ICD-10-CM

## 2015-03-03 MED ORDER — AMIODARONE HCL 200 MG PO TABS: 200.0000 mg | ORAL_TABLET | Freq: Every day | ORAL | Status: DC

## 2015-03-03 NOTE — Patient Instructions (Signed)
Medication Instructions:  Your physician has recommended you make the following change in your medication:  INCREASE Amiodarone to 273m Twice daily for 2 weeks. Then resume 2047mdaily  Labwork: None   Testing/Procedures: None   Follow-Up: Your physician recommends that you schedule a follow-up appointment in: 2 months    Any Other Special Instructions Will Be Listed Below (If Applicable).

## 2015-03-03 NOTE — Progress Notes (Signed)
Cardiology Office Note   Date:  03/03/2015   ID:  Maria [name], DOB July 09, 1924, MRN [id number]  PCP:  Mathews Argyle, MD  Cardiologist:   [name], MD   Chief Complaint  Patient presents with  . PAROXYSMAL ATRIAL FIBRILLATION  . Hypertension      History of Present Illness: Maria [name] is a 81 y.o. female who presents for mitral valve disease, paroxysmal atrial fibrillation, three-vessel coronary disease, chronic diastolic heart failure, and carotid occlusive disease. Also has history of sleep apnea.  The patient developed irregular heart rhythm with palpitations and decreased energy. Exertional dyspnea is present. Her husband, Dr. Gabrielle Dare, felt her pulse and noted it to be irregularly irregular. She has been on amiodarone to suppress atrial fibrillation. She has been under a lot of stress since finding out about a Klose has metastatic pancreatic cancer and a very poor prognosis.    Past Medical History  Diagnosis Date  . Osteoporosis   . Celiac disease   . Carotid artery occlusion     right carotid stenosis 40-60%, 4/08, neg for stenosis repeat study 11/11  . Depression   . Presbycusis   . Macular degeneration   . Urethral stricture   . Mild aortic insufficiency     a. Echo 10/2013: mild AI.  Marland Kitchen Moderate mitral regurgitation by prior echocardiogram     a. Echo 10/2013.  Marland Kitchen Thyroid nodule     25m, no change 11/11  . RLS (restless legs syndrome)   . Vitamin D deficiency   . Pulmonary hypertension   . Hypertension   . PAF (paroxysmal atrial fibrillation)   . Coronary artery disease     a.  08/2013 (LIMA-LAD, rSVG to LCX and Lt atrial clip),  . Chronic diastolic CHF (congestive heart failure)     takes Lasix daily  . MVP (mitral valve prolapse)   . Moderate obstructive sleep apnea     uses CPAP;sleep study about 4-547monthago  . GERD (gastroesophageal reflux disease)     takes Omeprazole daily  . Gastric ulcer     6-25m19monthgo  . GI  bleeding   . Hemorrhoids   . Urinary retention   . Urinary anastomotic stricture   . Hypothyroidism     takes Synthroid daily as result of AMiodarone  . Hot flashes     takes ZOloft 3 times a week  . Insomnia   . Peripheral edema     left  . DVT (deep venous thrombosis) 09/2013  . Atonic bladder 12/11/2013  . Chronic anticoagulation CHADS2VASC2 score 7 10/18/2014  . Anemia   . CKD (chronic kidney disease), stage III     Past Surgical History  Procedure Laterality Date  . Appendectomy    . Tubal ligation    . Tonsillectomy    . Trigger thumb    . Cardiac catheterization  08-06-13  . Tcs    . Bilateral cataract surgery    . Mandible surgery    . Coronary artery bypass graft N/A 08/28/2013    Procedure: CORONARY ARTERY BYPASS GRAFTING (CABG) x2 using right greater saphenous vein and left internal mammary artery. ;  Surgeon: EdwGrace IsaacD;  [facility];  Laterality: N/A;  . Intraoperative transesophageal echocardiogram N/A 08/28/2013    Procedure: INTRAOPERATIVE TRANSESOPHAGEAL ECHOCARDIOGRAM;  Surgeon: EdwGrace IsaacD;  [facility];  Laterality: N/A;  . Hip arthroplasty Right 12/09/2013  Procedure: ARTHROPLASTY BIPOLAR HIP CEMENTED;  Surgeon: Kerin Salen, MD;  Location: [facility];  Service: Orthopedics;  Laterality: Right;  . Left and right heart catheterization with coronary angiogram N/A 08/16/2013    Procedure: LEFT AND RIGHT HEART CATHETERIZATION WITH CORONARY ANGIOGRAM;  Surgeon: [name], MD;  Location: [facility] CATH LAB;  Service: Cardiovascular;  Laterality: N/A;     Current Outpatient Prescriptions  Medication Sig Dispense Refill  . acetaminophen (TYLENOL) 325 MG tablet Take 650 mg by mouth every 6 (six) hours as needed.    Marland Kitchen amiodarone (PACERONE) 200 MG tablet Take 1 tablet (200 mg total) by mouth daily. 60 tablet 2  . Cholecalciferol 1000 UNITS tablet Take 1,000 Units by mouth daily.      . furosemide (LASIX) 20 MG tablet Take 20 mg by mouth daily.    Marland Kitchen levothyroxine (SYNTHROID, LEVOTHROID) 50 MCG tablet Take 1 tablet (50 mcg total) by mouth daily before breakfast. 30 tablet 11  . MULTIPLE VITAMIN PO Take 1 tablet by mouth daily.    . polyethylene glycol (MIRALAX / GLYCOLAX) packet Take 17 g by mouth daily.    . Rivaroxaban (XARELTO) 15 MG TABS tablet Take 1 tablet (15 mg total) by mouth daily with supper. 90 tablet 1  . zolpidem (AMBIEN) 5 MG tablet Take 5 mg by mouth at bedtime as needed for sleep.      No current facility-administered medications for this visit.    Allergies:   Amoxicillin; Codeine; Fosamax; Gluten meal; Hctz; and Tetanus toxoids    Social History:  The patient  reports that she has never smoked. She has never used smokeless tobacco. She reports that she does not drink alcohol or use illicit drugs.   Family History:  The patient's family history includes AAA (abdominal aortic aneurysm) in her brother; CAD in her father; CVA in her mother; Colon cancer in her father; Heart attack in her brother, father, and mother; Peripheral vascular disease in her brother.    ROS:  Please see the history of present illness.   Otherwise, review of systems are positive for cemented nausea since contracting a gastrointestinal bug. Diarrhea 2 days. Occasional dizziness. Balance and difficulty walking. Sternal soreness since surgery. Exertional fatigue..   All other systems are reviewed and negative.    PHYSICAL EXAM: VS:  BP 124/54 mmHg  Pulse 77  Ht 5' 1"  (1.549 m)  Wt 108 lb 12.8 oz (49.351 kg)  BMI 20.57 kg/m2 , BMI Body mass index is 20.57 kg/(m^2). GEN: Well nourished, well developed, in no acute distress HEENT: normal Neck: no JVD, carotid bruits, or masses Cardiac: IIRR; no murmurs, rubs, or gallops,no edema  Respiratory:  clear to auscultation bilaterally, normal work of breathing GI: soft, nontender, nondistended, + BS MS: no deformity or atrophy Skin:  warm and dry, no rash Neuro:  Strength and sensation are intact Psych: euthymic mood, full affect   EKG:  EKG is ordered today. The ekg ordered today demonstrates left anterior hemiblock, right bundle branch block, atrial fib with controlled rate.   Recent Labs: 10/18/2014: ALT 18; B Natriuretic Peptide 609.0*; Magnesium 2.0; TSH 2.573 10/19/2014: BUN 13; Creatinine 0.81; Hemoglobin 10.5*; Platelets 273; Potassium 3.9; Sodium 132*    Lipid Panel    Component Value Date/Time   CHOL 173 10/19/2014 0315   TRIG 85 10/19/2014 0315   HDL 54 10/19/2014 0315   CHOLHDL 3.2 10/19/2014 0315   VLDL 17 10/19/2014 0315   LDLCALC 102* 10/19/2014 0315  Wt Readings from Last 3 Encounters:  03/03/15 108 lb 12.8 oz (49.351 kg)  11/29/14 113 lb 3.2 oz (51.347 kg)  10/24/14 112 lb (50.803 kg)      Other studies Reviewed: Additional studies/ records that were reviewed today include: . Review of the above records demonstrates: None   ASSESSMENT AND PLAN:  Essential hypertension, benign: Controlled  Paroxysmal atrial fibrillation: Recurrent atrial fibrillation despite amiodarone  Pulmonary hypertension, moderate to severe  Hx of CABG  Sinoatrial node dysfunction     Current medicines are reviewed at length with the patient today.  The patient has concerns regarding medicines.  The following changes have been made:  Crease amiodarone to 200 mg twice a day for 2 weeks.  Labs/ tests ordered today include:  No orders of the defined types were placed in this encounter.     Disposition:   FU with HS in 2 months  Signed, [name], MD  03/03/2015 10:13 AM    [address], [place], [place]  [address] Phone: [phone]; Fax: [phone]

## 2015-03-05 DIAGNOSIS — F4320 Adjustment disorder, unspecified: Secondary | ICD-10-CM | POA: Diagnosis not present

## 2015-03-07 ENCOUNTER — Telehealth: Payer: Self-pay | Admitting: Interventional Cardiology

## 2015-03-07 NOTE — Telephone Encounter (Signed)
New message         Pt is back to normal sinus   should she continue amioderone twice a day or go back to once a day

## 2015-03-07 NOTE — Telephone Encounter (Signed)
Maria [name] aware pt is to stay on Amiodarone BID for 2 weeks then resume once a day dosing.

## 2015-03-13 DIAGNOSIS — F4320 Adjustment disorder, unspecified: Secondary | ICD-10-CM | POA: Diagnosis not present

## 2015-03-17 ENCOUNTER — Telehealth: Payer: Self-pay

## 2015-03-17 NOTE — Telephone Encounter (Signed)
Pt husband sts that pt converted to NSR with in 48hrs after starting Amiodarone. Pt is still taking 232m bid. She has taken her morning dose. Pt husband reports pt is having weakness and feels bads. Pt would like to stop Amio. Adv Dr.Mallis that Dr.Smith is not in the office, he is in the cath lab. I will fwd him a message and call back with his response. He verbalized understanding.

## 2015-03-17 NOTE — Telephone Encounter (Signed)
Called to give pt husband Dr.Smith's response. REDUCE Amiodarone to 262m daily for 1 month THEN take 2072mevery M,W,F

## 2015-03-18 NOTE — Telephone Encounter (Signed)
F/u    Pt's spouse returning your call concerning previous message.

## 2015-03-18 NOTE — Telephone Encounter (Signed)
Pt husband  aware of Dr.Smith's recommendations.Marland Kitchen REDUCE Amiodarone to 274m daily for 1 month THEN take 207mevery M,W,F Pt husband verbalized understanding.

## 2015-03-19 DIAGNOSIS — Z79899 Other long term (current) drug therapy: Secondary | ICD-10-CM | POA: Diagnosis not present

## 2015-03-19 DIAGNOSIS — F4320 Adjustment disorder, unspecified: Secondary | ICD-10-CM | POA: Diagnosis not present

## 2015-03-19 DIAGNOSIS — I10 Essential (primary) hypertension: Secondary | ICD-10-CM | POA: Diagnosis not present

## 2015-03-19 DIAGNOSIS — R1013 Epigastric pain: Secondary | ICD-10-CM | POA: Diagnosis not present

## 2015-03-19 DIAGNOSIS — E222 Syndrome of inappropriate secretion of antidiuretic hormone: Secondary | ICD-10-CM | POA: Diagnosis not present

## 2015-03-19 DIAGNOSIS — K900 Celiac disease: Secondary | ICD-10-CM | POA: Diagnosis not present

## 2015-03-19 DIAGNOSIS — D649 Anemia, unspecified: Secondary | ICD-10-CM | POA: Diagnosis not present

## 2015-03-31 DIAGNOSIS — Z961 Presence of intraocular lens: Secondary | ICD-10-CM | POA: Diagnosis not present

## 2015-03-31 DIAGNOSIS — H16101 Unspecified superficial keratitis, right eye: Secondary | ICD-10-CM | POA: Diagnosis not present

## 2015-03-31 DIAGNOSIS — H04123 Dry eye syndrome of bilateral lacrimal glands: Secondary | ICD-10-CM | POA: Diagnosis not present

## 2015-03-31 DIAGNOSIS — H43813 Vitreous degeneration, bilateral: Secondary | ICD-10-CM | POA: Diagnosis not present

## 2015-04-03 DIAGNOSIS — F4320 Adjustment disorder, unspecified: Secondary | ICD-10-CM | POA: Diagnosis not present

## 2015-04-14 DIAGNOSIS — F4320 Adjustment disorder, unspecified: Secondary | ICD-10-CM | POA: Diagnosis not present

## 2015-04-16 DIAGNOSIS — K900 Celiac disease: Secondary | ICD-10-CM | POA: Diagnosis not present

## 2015-04-16 DIAGNOSIS — R269 Unspecified abnormalities of gait and mobility: Secondary | ICD-10-CM | POA: Diagnosis not present

## 2015-04-16 DIAGNOSIS — R5383 Other fatigue: Secondary | ICD-10-CM | POA: Diagnosis not present

## 2015-04-16 DIAGNOSIS — R05 Cough: Secondary | ICD-10-CM | POA: Diagnosis not present

## 2015-04-16 DIAGNOSIS — D509 Iron deficiency anemia, unspecified: Secondary | ICD-10-CM | POA: Diagnosis not present

## 2015-04-16 DIAGNOSIS — R51 Headache: Secondary | ICD-10-CM | POA: Diagnosis not present

## 2015-04-17 ENCOUNTER — Other Ambulatory Visit: Payer: Self-pay | Admitting: Geriatric Medicine

## 2015-04-17 DIAGNOSIS — R05 Cough: Secondary | ICD-10-CM

## 2015-04-17 DIAGNOSIS — R059 Cough, unspecified: Secondary | ICD-10-CM

## 2015-04-17 DIAGNOSIS — R519 Headache, unspecified: Secondary | ICD-10-CM

## 2015-04-17 DIAGNOSIS — R51 Headache: Principal | ICD-10-CM

## 2015-04-18 ENCOUNTER — Ambulatory Visit
Admission: RE | Admit: 2015-04-18 | Discharge: 2015-04-18 | Disposition: A | Discharge status: Home / Self Care | Payer: Medicare Other | Source: Ambulatory Visit | Attending: Geriatric Medicine | Admitting: Geriatric Medicine

## 2015-04-18 DIAGNOSIS — R059 Cough, unspecified: Secondary | ICD-10-CM

## 2015-04-18 DIAGNOSIS — R5383 Other fatigue: Secondary | ICD-10-CM | POA: Diagnosis not present

## 2015-04-18 DIAGNOSIS — R05 Cough: Secondary | ICD-10-CM

## 2015-04-18 DIAGNOSIS — R519 Headache, unspecified: Secondary | ICD-10-CM

## 2015-04-18 DIAGNOSIS — Z79899 Other long term (current) drug therapy: Secondary | ICD-10-CM | POA: Diagnosis not present

## 2015-04-18 DIAGNOSIS — R51 Headache: Secondary | ICD-10-CM | POA: Diagnosis not present

## 2015-04-21 ENCOUNTER — Other Ambulatory Visit: Payer: Self-pay

## 2015-04-21 DIAGNOSIS — F4320 Adjustment disorder, unspecified: Secondary | ICD-10-CM | POA: Diagnosis not present

## 2015-05-05 DIAGNOSIS — F4320 Adjustment disorder, unspecified: Secondary | ICD-10-CM | POA: Diagnosis not present

## 2015-05-05 NOTE — Progress Notes (Signed)
Cardiology Office Note   Date:  05/06/2015   ID:  Maria [name], DOB May 22, 1924, MRN [id number]  PCP:  Maria Argyle, MD  Cardiologist:  Maria Grooms, MD   Chief Complaint  Patient presents with  . Atrial Fibrillation      History of Present Illness: Maria [name] is a 81 y.o. female who presents for  Coronary artery disease, paroxysmal atrial fibrillation, hypertension, mitral regurgitation, obstructive sleep apnea, an chronic anticoagulation therapy. Prior history of DVT following coronary bypass grafting.   Patient is doing relatively well. She has nausea on amiodarone and has discontinued the medication. Off amiodarone, she has had no recurrence of atrial fibrillation. She denies orthopnea, PND, syncope, bleeding on  Xarelto.    Past Medical History  Diagnosis Date  . Osteoporosis   . Celiac disease   . Carotid artery occlusion     right carotid stenosis 40-60%, 4/08, neg for stenosis repeat study 11/11  . Depression   . Presbycusis   . Macular degeneration   . Urethral stricture   . Mild aortic insufficiency     a. Echo 10/2013: mild AI.  Marland Kitchen Moderate mitral regurgitation by prior echocardiogram     a. Echo 10/2013.  Marland Kitchen Thyroid nodule     572m, no change 11/11  . RLS (restless legs syndrome)   . Vitamin D deficiency   . Pulmonary hypertension   . Hypertension   . PAF (paroxysmal atrial fibrillation)   . Coronary artery disease     a.  08/2013 (LIMA-LAD, rSVG to LCX and Lt atrial clip),  . Chronic diastolic CHF (congestive heart failure)     takes Lasix daily  . MVP (mitral valve prolapse)   . Moderate obstructive sleep apnea     uses CPAP;sleep study about 4-559monthago  . GERD (gastroesophageal reflux disease)     takes Omeprazole daily  . Gastric ulcer     6-72m19monthgo  . GI bleeding   . Hemorrhoids   . Urinary retention   . Urinary anastomotic stricture   . Hypothyroidism     takes Synthroid daily as result of AMiodarone  . Hot  flashes     takes ZOloft 3 times a week  . Insomnia   . Peripheral edema     left  . DVT (deep venous thrombosis) 09/2013  . Atonic bladder 12/11/2013  . Chronic anticoagulation CHADS2VASC2 score 7 10/18/2014  . Anemia   . CKD (chronic kidney disease), stage III     Past Surgical History  Procedure Laterality Date  . Appendectomy    . Tubal ligation    . Tonsillectomy    . Trigger thumb    . Cardiac catheterization  08-06-13  . Tcs    . Bilateral cataract surgery    . Mandible surgery    . Coronary artery bypass graft N/A 08/28/2013    Procedure: CORONARY ARTERY BYPASS GRAFTING (CABG) x2 using right greater saphenous vein and left internal mammary artery. ;  Surgeon: Maria [name];  [facility];  Laterality: N/A;  . Intraoperative transesophageal echocardiogram N/A 08/28/2013    Procedure: INTRAOPERATIVE TRANSESOPHAGEAL ECHOCARDIOGRAM;  Surgeon: Maria [name];  [facility];  Laterality: N/A;  . Hip arthroplasty Right 12/09/2013    Procedure: ARTHROPLASTY BIPOLAR HIP CEMENTED;  Surgeon: Maria [name];  [facility];  Laterality: Right;  . Left and right heart  catheterization with coronary angiogram N/A 08/16/2013    Procedure: LEFT AND RIGHT HEART CATHETERIZATION WITH CORONARY ANGIOGRAM;  Surgeon: Maria Grooms, MD;  Location: [facility] CATH LAB;  Service: Cardiovascular;  Laterality: N/A;     Current Outpatient Prescriptions  Medication Sig Dispense Refill  . acetaminophen (TYLENOL) 325 MG tablet Take 650 mg by mouth every 6 (six) hours as needed.    Marland Kitchen amLODipine (NORVASC) 5 MG tablet Take 5 mg by mouth every Monday, Wednesday, and Friday.    . Cholecalciferol 1000 UNITS tablet Take 1,000 Units by mouth daily.    . furosemide (LASIX) 20 MG tablet Take 20 mg by mouth daily.    Marland Kitchen levothyroxine (SYNTHROID, LEVOTHROID) 50 MCG tablet Take 1 tablet (50 mcg total) by mouth daily  before breakfast. 30 tablet 11  . MULTIPLE VITAMIN PO Take 1 tablet by mouth daily.    . polyethylene glycol (MIRALAX / GLYCOLAX) packet Take 17 g by mouth daily.    . Rivaroxaban (XARELTO) 15 MG TABS tablet Take 1 tablet (15 mg total) by mouth daily with supper. 90 tablet 1  . sertraline (ZOLOFT) 25 MG tablet Take 25 mg by mouth daily.    Marland Kitchen zolpidem (AMBIEN) 5 MG tablet Take 5 mg by mouth at bedtime as needed for sleep.      No current facility-administered medications for this visit.    Allergies:   Amoxicillin; Codeine; Fosamax; Gluten meal; Hctz; and Tetanus toxoids    Social History:  The patient  reports that she has never smoked. She has never used smokeless tobacco. She reports that she does not drink alcohol or use illicit drugs.   Family History:  The patient's family history includes AAA (abdominal aortic aneurysm) in her brother; CAD in her father; CVA in her mother; Colon cancer in her father; Heart attack in her brother, father, and mother; Peripheral vascular disease in her brother.    ROS:  Please see the history of present illness.   Otherwise, review of systems are positive for  Pain in the right calf , dizziness, difficulty with ambulation..   All other systems are reviewed and negative.    PHYSICAL EXAM: VS:  BP 128/60 mmHg  Pulse 57  Ht 5' 1.5" (1.562 m)  Wt 48.898 kg (107 lb 12.8 oz)  BMI 20.04 kg/m2 , BMI Body mass index is 20.04 kg/(m^2). GEN: Well nourished, well developed, in no acute distress HEENT: normal Neck: no JVD, carotid bruits, or masses Cardiac: RRR; 2/6 systolic murmur. No rubs, or gallops,no edema  Respiratory:  clear to auscultation bilaterally, normal work of breathing GI: soft, nontender, nondistended, + BS MS: no deformity or atrophy Skin: warm and dry, no rash Neuro:  Strength and sensation are intact Psych: euthymic mood, full affect   EKG:  EKG is ordered today. The ekg ordered today demonstrates sinus bradycardia, right bundle,  left anterior hemiblock.   Recent Labs: 10/18/2014: ALT 18; B Natriuretic Peptide 609.0*; Magnesium 2.0; TSH 2.573 10/19/2014: BUN 13; Creatinine, Ser 0.81; Hemoglobin 10.5*; Platelets 273; Potassium 3.9; Sodium 132*    Lipid Panel    Component Value Date/Time   CHOL 173 10/19/2014 0315   TRIG 85 10/19/2014 0315   HDL 54 10/19/2014 0315   CHOLHDL 3.2 10/19/2014 0315   VLDL 17 10/19/2014 0315   LDLCALC 102* 10/19/2014 0315      Wt Readings from Last 3 Encounters:  05/06/15 48.898 kg (107 lb 12.8 oz)  03/03/15 49.351 kg (108 lb 12.8 oz)  11/29/14 51.347 kg (113 lb 3.2 oz)      Other studies Reviewed: Additional studies/ records that were reviewed today include: .    ASSESSMENT AND PLAN:  1. Paroxysmal atrial fibrillation  now off amiodarone. Likely to recur  2. Hx of CABG  no angina or symptoms of ischemia  3. Chronic anticoagulation CHADS2VASC2 score 7  no bleeding complications also relative oh  4. Essential hypertension, benign  relatively well controlled after the addition of amlodipine. Now her husband complains of the blood pressure is too low.  5. Mitral regurgitation  mild mitral regurgitation based upon intensity of murmur  6. Pulmonary hypertension, moderate to severe  untreated    Current medicines are reviewed at length with the patient today.  The patient has concerns regarding medicines.  The following changes have been made:   We have officially discontinued amiodarone.  Labs/ tests ordered today include:   Orders Placed This Encounter  Procedures  . EKG 12-Lead     Disposition:   FU with HS in 6 months  Signed, Maria Grooms, MD  05/06/2015 6:08 PM    [facility] [address], [place], [place]  [address] Phone: [phone]; Fax: [phone]

## 2015-05-06 ENCOUNTER — Encounter: Payer: Self-pay | Admitting: Interventional Cardiology

## 2015-05-06 ENCOUNTER — Ambulatory Visit (INDEPENDENT_AMBULATORY_CARE_PROVIDER_SITE_OTHER): Payer: Medicare Other | Admitting: Interventional Cardiology

## 2015-05-06 VITALS — BP 128/60 | HR 57 | Ht 61.5 in | Wt 107.8 lb

## 2015-05-06 DIAGNOSIS — I270 Primary pulmonary hypertension: Secondary | ICD-10-CM

## 2015-05-06 DIAGNOSIS — I340 Nonrheumatic mitral (valve) insufficiency: Secondary | ICD-10-CM

## 2015-05-06 DIAGNOSIS — I272 Pulmonary hypertension, unspecified: Secondary | ICD-10-CM

## 2015-05-06 DIAGNOSIS — Z7901 Long term (current) use of anticoagulants: Secondary | ICD-10-CM

## 2015-05-06 DIAGNOSIS — Z951 Presence of aortocoronary bypass graft: Secondary | ICD-10-CM | POA: Diagnosis not present

## 2015-05-06 DIAGNOSIS — I10 Essential (primary) hypertension: Secondary | ICD-10-CM | POA: Diagnosis not present

## 2015-05-06 DIAGNOSIS — I480 Paroxysmal atrial fibrillation: Secondary | ICD-10-CM | POA: Diagnosis not present

## 2015-05-06 NOTE — Patient Instructions (Signed)
Medication Instructions:  Your physician has recommended you make the following change in your medication:  REDUCE Amlodipine to 37m every Mon, Wed,Fri   Labwork: None   Testing/Procedures: None   Follow-Up: Your physician wants you to follow-up in: 6 months with Dr.Smith You will receive a reminder letter in the mail two months in advance. If you don't receive a letter, please call our office to schedule the follow-up appointment.   Any Other Special Instructions Will Be Listed Below (If Applicable). Measure your blood pressures daily for 1 week and call the office with your readings

## 2015-05-13 DIAGNOSIS — F4320 Adjustment disorder, unspecified: Secondary | ICD-10-CM | POA: Diagnosis not present

## 2015-05-16 DIAGNOSIS — I10 Essential (primary) hypertension: Secondary | ICD-10-CM | POA: Diagnosis not present

## 2015-05-16 DIAGNOSIS — R51 Headache: Secondary | ICD-10-CM | POA: Diagnosis not present

## 2015-05-16 DIAGNOSIS — L659 Nonscarring hair loss, unspecified: Secondary | ICD-10-CM | POA: Diagnosis not present

## 2015-05-16 DIAGNOSIS — Z1211 Encounter for screening for malignant neoplasm of colon: Secondary | ICD-10-CM | POA: Diagnosis not present

## 2015-05-16 DIAGNOSIS — R2689 Other abnormalities of gait and mobility: Secondary | ICD-10-CM | POA: Diagnosis not present

## 2015-05-16 DIAGNOSIS — R413 Other amnesia: Secondary | ICD-10-CM | POA: Diagnosis not present

## 2015-05-19 DIAGNOSIS — F4320 Adjustment disorder, unspecified: Secondary | ICD-10-CM | POA: Diagnosis not present

## 2015-05-28 DIAGNOSIS — F4320 Adjustment disorder, unspecified: Secondary | ICD-10-CM | POA: Diagnosis not present

## 2015-06-09 ENCOUNTER — Other Ambulatory Visit: Payer: Self-pay | Admitting: Interventional Cardiology

## 2015-06-09 ENCOUNTER — Emergency Department (HOSPITAL_BASED_OUTPATIENT_CLINIC_OR_DEPARTMENT_OTHER): Payer: Medicare Other

## 2015-06-09 ENCOUNTER — Emergency Department (HOSPITAL_BASED_OUTPATIENT_CLINIC_OR_DEPARTMENT_OTHER)
Admission: EM | Admit: 2015-06-09 | Discharge: 2015-06-09 | Disposition: A | Discharge status: Home / Self Care | Payer: Medicare Other | Attending: Emergency Medicine | Admitting: Emergency Medicine

## 2015-06-09 ENCOUNTER — Encounter (HOSPITAL_BASED_OUTPATIENT_CLINIC_OR_DEPARTMENT_OTHER): Payer: Self-pay | Admitting: *Deleted

## 2015-06-09 ENCOUNTER — Telehealth: Payer: Self-pay | Admitting: Interventional Cardiology

## 2015-06-09 DIAGNOSIS — Z880 Allergy status to penicillin: Secondary | ICD-10-CM | POA: Insufficient documentation

## 2015-06-09 DIAGNOSIS — Y998 Other external cause status: Secondary | ICD-10-CM | POA: Diagnosis not present

## 2015-06-09 DIAGNOSIS — W01198A Fall on same level from slipping, tripping and stumbling with subsequent striking against other object, initial encounter: Secondary | ICD-10-CM | POA: Insufficient documentation

## 2015-06-09 DIAGNOSIS — S0990XA Unspecified injury of head, initial encounter: Secondary | ICD-10-CM | POA: Diagnosis present

## 2015-06-09 DIAGNOSIS — S0083XA Contusion of other part of head, initial encounter: Secondary | ICD-10-CM | POA: Insufficient documentation

## 2015-06-09 DIAGNOSIS — F329 Major depressive disorder, single episode, unspecified: Secondary | ICD-10-CM | POA: Diagnosis not present

## 2015-06-09 DIAGNOSIS — Z7901 Long term (current) use of anticoagulants: Secondary | ICD-10-CM | POA: Diagnosis not present

## 2015-06-09 DIAGNOSIS — Z87448 Personal history of other diseases of urinary system: Secondary | ICD-10-CM | POA: Diagnosis not present

## 2015-06-09 DIAGNOSIS — R51 Headache: Secondary | ICD-10-CM | POA: Diagnosis not present

## 2015-06-09 DIAGNOSIS — Z8669 Personal history of other diseases of the nervous system and sense organs: Secondary | ICD-10-CM | POA: Insufficient documentation

## 2015-06-09 DIAGNOSIS — I129 Hypertensive chronic kidney disease with stage 1 through stage 4 chronic kidney disease, or unspecified chronic kidney disease: Secondary | ICD-10-CM | POA: Diagnosis not present

## 2015-06-09 DIAGNOSIS — N183 Chronic kidney disease, stage 3 (moderate): Secondary | ICD-10-CM | POA: Insufficient documentation

## 2015-06-09 DIAGNOSIS — Y9389 Activity, other specified: Secondary | ICD-10-CM | POA: Insufficient documentation

## 2015-06-09 DIAGNOSIS — Z862 Personal history of diseases of the blood and blood-forming organs and certain disorders involving the immune mechanism: Secondary | ICD-10-CM | POA: Insufficient documentation

## 2015-06-09 DIAGNOSIS — Z86718 Personal history of other venous thrombosis and embolism: Secondary | ICD-10-CM | POA: Insufficient documentation

## 2015-06-09 DIAGNOSIS — I5032 Chronic diastolic (congestive) heart failure: Secondary | ICD-10-CM | POA: Insufficient documentation

## 2015-06-09 DIAGNOSIS — Z79899 Other long term (current) drug therapy: Secondary | ICD-10-CM | POA: Diagnosis not present

## 2015-06-09 DIAGNOSIS — E559 Vitamin D deficiency, unspecified: Secondary | ICD-10-CM | POA: Diagnosis not present

## 2015-06-09 DIAGNOSIS — M810 Age-related osteoporosis without current pathological fracture: Secondary | ICD-10-CM | POA: Diagnosis not present

## 2015-06-09 DIAGNOSIS — R05 Cough: Secondary | ICD-10-CM | POA: Insufficient documentation

## 2015-06-09 DIAGNOSIS — I2510 Atherosclerotic heart disease of native coronary artery without angina pectoris: Secondary | ICD-10-CM | POA: Diagnosis not present

## 2015-06-09 DIAGNOSIS — E039 Hypothyroidism, unspecified: Secondary | ICD-10-CM | POA: Diagnosis not present

## 2015-06-09 DIAGNOSIS — E041 Nontoxic single thyroid nodule: Secondary | ICD-10-CM | POA: Insufficient documentation

## 2015-06-09 DIAGNOSIS — W19XXXA Unspecified fall, initial encounter: Secondary | ICD-10-CM

## 2015-06-09 DIAGNOSIS — Y92002 Bathroom of unspecified non-institutional (private) residence single-family (private) house as the place of occurrence of the external cause: Secondary | ICD-10-CM | POA: Insufficient documentation

## 2015-06-09 DIAGNOSIS — Z8719 Personal history of other diseases of the digestive system: Secondary | ICD-10-CM | POA: Insufficient documentation

## 2015-06-09 DIAGNOSIS — T148XXA Other injury of unspecified body region, initial encounter: Secondary | ICD-10-CM

## 2015-06-09 LAB — BASIC METABOLIC PANEL
Anion gap: 9 (ref 5–15)
BUN: 13 mg/dL (ref 6–20)
CO2: 23 mmol/L (ref 22–32)
CREATININE: 0.55 mg/dL (ref 0.44–1.00)
Calcium: 9.2 mg/dL (ref 8.9–10.3)
Chloride: 101 mmol/L (ref 101–111)
GFR calc Af Amer: >60 mL/min (ref >60)
GLUCOSE: 103 mg/dL — AB (ref 65–99)
Potassium: 4.1 mmol/L (ref 3.5–5.1)
SODIUM: 133 mmol/L — AB (ref 135–145)

## 2015-06-09 LAB — URINALYSIS, ROUTINE W REFLEX MICROSCOPIC
BILIRUBIN URINE: NEGATIVE
Glucose, UA: NEGATIVE mg/dL
Ketones, ur: NEGATIVE mg/dL
Leukocytes, UA: NEGATIVE
Nitrite: NEGATIVE
PH: 7 (ref 5.0–8.0)
Protein, ur: NEGATIVE mg/dL
SPECIFIC GRAVITY, URINE: 1.006 (ref 1.005–1.030)
Urobilinogen, UA: 0.2 mg/dL (ref 0.0–1.0)

## 2015-06-09 LAB — CBC WITH DIFFERENTIAL/PLATELET
BASOS ABS: 0 10*3/uL (ref 0.0–0.1)
BASOS PCT: 0 % (ref 0–1)
EOS ABS: 0.2 10*3/uL (ref 0.0–0.7)
EOS PCT: 3 % (ref 0–5)
HCT: 39 % (ref 36.0–46.0)
Hemoglobin: 13.2 g/dL (ref 12.0–15.0)
LYMPHS PCT: 29 % (ref 12–46)
Lymphs Abs: 1.6 10*3/uL (ref 0.7–4.0)
MCH: 28.2 pg (ref 26.0–34.0)
MCHC: 33.8 g/dL (ref 30.0–36.0)
MCV: 83.3 fL (ref 78.0–100.0)
Monocytes Absolute: 0.7 10*3/uL (ref 0.1–1.0)
Monocytes Relative: 12 % (ref 3–12)
Neutro Abs: 3 10*3/uL (ref 1.7–7.7)
Neutrophils Relative %: 55 % (ref 43–77)
PLATELETS: 284 10*3/uL (ref 150–400)
RBC: 4.68 MIL/uL (ref 3.87–5.11)
RDW: 23 % — AB (ref 11.5–15.5)
WBC: 5.5 10*3/uL (ref 4.0–10.5)

## 2015-06-09 LAB — TROPONIN I

## 2015-06-09 LAB — BRAIN NATRIURETIC PEPTIDE: B Natriuretic Peptide: 125.3 pg/mL — ABNORMAL HIGH (ref 0.0–100.0)

## 2015-06-09 LAB — URINE MICROSCOPIC-ADD ON

## 2015-06-09 MED ORDER — AMIODARONE HCL 100 MG PO TABS: 100.0000 mg | ORAL_TABLET | Freq: Every day | ORAL | Status: DC

## 2015-06-09 NOTE — Telephone Encounter (Signed)
Pt husband Dr.Lafever aware of Dr.Smith's recommendation. Pt would have converted to NSR on her on, one dose of Amio would not have helped to convert her back to NSR. Amiodarone needs to be built up in her system for Afib suppression. Pt should resume Amiodarone 1104m daily. Pt husband verbalized understanding. Pt husband sts that they do not currently need an Rx sent in, they have a supply at home

## 2015-06-09 NOTE — Discharge Instructions (Signed)
Head Injury You have received a head injury. It does not appear serious at this time. Headaches and vomiting are common following head injury. It should be easy to awaken from sleeping. Sometimes it is necessary for you to stay in the emergency department for a while for observation. Sometimes admission to the hospital may be needed. After injuries such as yours, most problems occur within the first 24 hours, but side effects may occur up to 7-10 days after the injury. It is important for you to carefully monitor your condition and contact your health care provider or seek immediate medical care if there is a change in your condition. WHAT ARE THE TYPES OF HEAD INJURIES? Head injuries can be as minor as a bump. Some head injuries can be more severe. More severe head injuries include:  A jarring injury to the brain (concussion).  A bruise of the brain (contusion). This mean there is bleeding in the brain that can cause swelling.  A cracked skull (skull fracture).  Bleeding in the brain that collects, clots, and forms a bump (hematoma). WHAT CAUSES A HEAD INJURY? A serious head injury is most likely to happen to someone who is in a car wreck and is not wearing a seat belt. Other causes of major head injuries include bicycle or motorcycle accidents, sports injuries, and falls. HOW ARE HEAD INJURIES DIAGNOSED? A complete history of the event leading to the injury and your current symptoms will be helpful in diagnosing head injuries. Many times, pictures of the brain, such as CT or MRI are needed to see the extent of the injury. Often, an overnight hospital stay is necessary for observation.  WHEN SHOULD I SEEK IMMEDIATE MEDICAL CARE?  You should get help right away if:  You have confusion or drowsiness.  You feel sick to your stomach (nauseous) or have continued, forceful vomiting.  You have dizziness or unsteadiness that is getting worse.  You have severe, continued headaches not relieved by  medicine. Only take over-the-counter or prescription medicines for pain, fever, or discomfort as directed by your health care provider.  You do not have normal function of the arms or legs or are unable to walk.  You notice changes in the black spots in the center of the colored part of your eye (pupil).  You have a clear or bloody fluid coming from your nose or ears.  You have a loss of vision. During the next 24 hours after the injury, you must stay with someone who can watch you for the warning signs. This person should contact local emergency services (911 in the [place] if you have seizures, you become unconscious, or you are unable to wake up. HOW CAN I PREVENT A HEAD INJURY IN THE FUTURE? The most important factor for preventing major head injuries is avoiding motor vehicle accidents. To minimize the potential for damage to your head, it is crucial to wear seat belts while riding in motor vehicles. Wearing helmets while bike riding and playing collision sports (like football) is also helpful. Also, avoiding dangerous activities around the house will further help reduce your risk of head injury.  WHEN CAN I RETURN TO NORMAL ACTIVITIES AND ATHLETICS? You should be reevaluated by your health care provider before returning to these activities. If you have any of the following symptoms, you should not return to activities or contact sports until 1 week after the symptoms have stopped:  Persistent headache.  Dizziness or vertigo.  Poor attention and concentration.  Confusion.  Memory problems.  Nausea or vomiting.  Fatigue or tire easily.  Irritability.  Intolerant of bright lights or loud noises.  Anxiety or depression.  Disturbed sleep. MAKE SURE YOU:   Understand these instructions.  Will watch your condition.  Will get help right away if you are not doing well or get worse. Document Released: 10/11/2005 Document Revised: 10/16/2013 Document Reviewed:  06/18/2013 [facility] Patient Information 2015 [place], [place]. This information is not intended to replace advice given to you by your health care provider. Make sure you discuss any questions you have with your health care provider.

## 2015-06-09 NOTE — ED Notes (Signed)
Ambulated pt to bathroom w/ out difficulty.

## 2015-06-09 NOTE — ED Notes (Signed)
Lost her balance and fell. Hematoma to her forehead.

## 2015-06-09 NOTE — ED Provider Notes (Signed)
CSN: [id number]     Arrival date & time 06/09/15  1943 History  This chart was scribed for Debby Freiberg, MD by Meriel Pica, ED Scribe. This patient was seen in room MH07/MH07 and the patient's care was started 8:30 PM.   Chief Complaint  Patient presents with  . Fall  . Head Injury   Patient is a 81 y.o. female presenting with fall and head injury. The history is provided by the patient. No language interpreter was used.  Fall This is a new problem. The current episode started 1 to 2 hours ago. The problem occurs constantly. The problem has been gradually improving. Nothing aggravates the symptoms. Nothing relieves the symptoms. She has tried a cold compress (ice pack applied) for the symptoms. The treatment provided mild relief.  Head Injury Location:  Frontal Mechanism of injury: fall   Pain details:    Severity:  Moderate   Timing:  Constant   Progression:  Improving Chronicity:  New Associated symptoms: no blurred vision, no double vision, no loss of consciousness, no nausea, no numbness and no vomiting   Risk factors: being elderly    HPI Comments: Maria [name] is a 81 y.o. female, with an extensive PMhx, who presents to the Emergency Department complaining of a gradually improving, constant, moderate hematoma to right-sided forehead s/p fall that occurred 1.5 hours ago. Pt reports she fell in the bathroom when she lost her balance while trying to flush the toilet and hit her head on a ceramic tile floor. After the fall, the pt was able to stand and ambulate to find her husband, per husband. Husband states the swelling to pt's forehead has significantly decreased since the fall. She has applied ice to the affected area and taken 2 tylenol PTA and states she is not in pain currently. Pt reports 2 days ago she experienced an episode of AFib for which she took amiodarone. She states she has been feeling lethargic and mildly nauseous today which is at baseline for her s/p taking  amiodarone. Pt is on Xarelto for Afib. Per husband, pt has been light-headed with a mild, unsteady gait for the past 6 months. She has fallen twice in the past 6 months. She additionally notes a mild, intermittent, non-productive cough. Denies blurred vision or visual disturbance, syncope or LOC, new nausea, vomiting, numbness or weakness. PCP: Hal Stoneking.   Past Medical History  Diagnosis Date  . Osteoporosis   . Celiac disease   . Carotid artery occlusion     right carotid stenosis 40-60%, 4/08, neg for stenosis repeat study 11/11  . Depression   . Presbycusis   . Macular degeneration   . Urethral stricture   . Mild aortic insufficiency     a. Echo 10/2013: mild AI.  Marland Kitchen Moderate mitral regurgitation by prior echocardiogram     a. Echo 10/2013.  Marland Kitchen Thyroid nodule     20m, no change 11/11  . RLS (restless legs syndrome)   . Vitamin D deficiency   . Pulmonary hypertension   . Hypertension   . PAF (paroxysmal atrial fibrillation)   . Coronary artery disease     a.  08/2013 (LIMA-LAD, rSVG to LCX and Lt atrial clip),  . Chronic diastolic CHF (congestive heart failure)     takes Lasix daily  . MVP (mitral valve prolapse)   . Moderate obstructive sleep apnea     uses CPAP;sleep study about 4-552monthago  . GERD (gastroesophageal reflux disease)     takes  Omeprazole daily  . Gastric ulcer     6-9month ago  . GI bleeding   . Hemorrhoids   . Urinary retention   . Urinary anastomotic stricture   . Hypothyroidism     takes Synthroid daily as result of AMiodarone  . Hot flashes     takes ZOloft 3 times a week  . Insomnia   . Peripheral edema     left  . DVT (deep venous thrombosis) 09/2013  . Atonic bladder 12/11/2013  . Chronic anticoagulation CHADS2VASC2 score 7 10/18/2014  . Anemia   . CKD (chronic kidney disease), stage III   . Atrial fibrillation    Past Surgical History  Procedure Laterality Date  . Appendectomy    . Tubal ligation    . Tonsillectomy    . Trigger  thumb    . Cardiac catheterization  08-06-13  . Tcs    . Bilateral cataract surgery    . Mandible surgery    . Coronary artery bypass graft N/A 08/28/2013    Procedure: CORONARY ARTERY BYPASS GRAFTING (CABG) x2 using right greater saphenous vein and left internal mammary artery. ;  Surgeon: EGrace Isaac MD;  [facility]  Service: Open Heart Surgery;  Laterality: N/A;  . Intraoperative transesophageal echocardiogram N/A 08/28/2013    Procedure: INTRAOPERATIVE TRANSESOPHAGEAL ECHOCARDIOGRAM;  Surgeon: EGrace Isaac MD;  [facility]  Service: Open Heart Surgery;  Laterality: N/A;  . Hip arthroplasty Right 12/09/2013    Procedure: ARTHROPLASTY BIPOLAR HIP CEMENTED;  Surgeon: FKerin Salen MD;  [facility]  Service: Orthopedics;  Laterality: Right;  . Left and right heart catheterization with coronary angiogram N/A 08/16/2013    Procedure: LEFT AND RIGHT HEART CATHETERIZATION WITH CORONARY ANGIOGRAM;  Surgeon: HSinclair Grooms MD;  [facility];  Service: Cardiovascular;  Laterality: N/A;   Family History  Problem Relation Age of Onset  . Heart attack Mother   . CVA Mother   . CAD Father   . Colon cancer Father   . Heart attack Father   . Heart attack Brother   . Peripheral vascular disease Brother   . AAA (abdominal aortic aneurysm) Brother    Social History  Substance Use Topics  . Smoking status: Never Smoker   . Smokeless tobacco: Never Used  . Alcohol Use: No   OB History    No data available     Review of Systems  Eyes: Negative for blurred vision, double vision and visual disturbance.  Respiratory: Positive for cough.   Gastrointestinal: Negative for nausea and vomiting.  Skin: Positive for color change ( hematoma to forehead).  Neurological: Negative for loss of consciousness, syncope and numbness.  All other systems reviewed and are negative.  Allergies  Amoxicillin; Codeine; Fosamax; Gluten meal; Hctz; and Tetanus toxoids  Home  Medications   Prior to Admission medications   Medication Sig Start Date End Date Taking? Authorizing Provider  acetaminophen (TYLENOL) 325 MG tablet Take 650 mg by mouth every 6 (six) hours as needed.    Historical Provider, MD  amiodarone (PACERONE) 100 MG tablet Take 1 tablet (100 mg total) by mouth daily. 06/09/15   HBelva Crome MD  amLODipine (NORVASC) 5 MG tablet Take 5 mg by mouth every Monday, Wednesday, and Friday. 04/24/15   Historical Provider, MD  Cholecalciferol 1000 UNITS tablet Take 1,000 Units by mouth daily.    Historical Provider, MD  furosemide (LASIX) 20 MG tablet Take 20 mg by mouth  daily.    Historical Provider, MD  levothyroxine (SYNTHROID, LEVOTHROID) 50 MCG tablet Take 1 tablet (50 mcg total) by mouth daily before breakfast. 01/03/14   Lavon Paganini Angiulli, PA-C  MULTIPLE VITAMIN PO Take 1 tablet by mouth daily.    Historical Provider, MD  polyethylene glycol (MIRALAX / GLYCOLAX) packet Take 17 g by mouth daily.    Historical Provider, MD  sertraline (ZOLOFT) 25 MG tablet Take 25 mg by mouth daily.    Historical Provider, MD  XARELTO 15 MG TABS tablet TAKE ONE TABLET BY MOUTH ONCE DAILY WITH  SUPPER 06/10/15   Belva Crome, MD  zolpidem (AMBIEN) 5 MG tablet Take 5 mg by mouth at bedtime as needed for sleep.  11/07/14   Historical Provider, MD   BP 159/50 mmHg  Pulse 66  Temp(Src) 98.4 F (36.9 C) (Oral)  Resp 20  Ht 5' 1"  (1.549 m)  Wt 107 lb (48.535 kg)  BMI 20.23 kg/m2  SpO2 98% Physical Exam  Constitutional: She is oriented to person, place, and time. She appears well-developed and well-nourished.  HENT:  Head: Normocephalic.  Right Ear: External ear normal.  Left Ear: External ear normal.  5 cm contusion to R brow  Eyes: Conjunctivae and EOM are normal. Pupils are equal, round, and reactive to light.  Neck: Normal range of motion. Neck supple.  Cardiovascular: Normal rate, regular rhythm, normal heart sounds and intact distal pulses.   Pulmonary/Chest:  Effort normal and breath sounds normal.  Abdominal: Soft. Bowel sounds are normal. There is no tenderness.  Musculoskeletal: Normal range of motion.  Neurological: She is alert and oriented to person, place, and time.  Skin: Skin is warm and dry.  Vitals reviewed.   ED Course  Procedures  DIAGNOSTIC STUDIES: Oxygen Saturation is 98% on RA, normal by my interpretation.    COORDINATION OF CARE: 8:41 PM Discussed treatment plan which includes to order a head CT, EKG, and diagnostic labs with pt. Pt acknowledges and agrees to plan.   Labs Review Labs Reviewed  CBC WITH DIFFERENTIAL/PLATELET - Abnormal; Notable for the following:    RDW 23.0 (*)    All other components within normal limits  BASIC METABOLIC PANEL - Abnormal; Notable for the following:    Sodium 133 (*)    Glucose, Bld 103 (*)    All other components within normal limits  BRAIN NATRIURETIC PEPTIDE - Abnormal; Notable for the following:    B Natriuretic Peptide 125.3 (*)    All other components within normal limits  URINALYSIS, ROUTINE W REFLEX MICROSCOPIC (NOT AT [facility]) - Abnormal; Notable for the following:    Hgb urine dipstick TRACE (*)    All other components within normal limits  TROPONIN I  URINE MICROSCOPIC-ADD ON    Imaging Review Dg Chest 2 View  06/09/2015   CLINICAL DATA:  Cough  EXAM: CHEST  2 VIEW  COMPARISON:  04/18/2015  FINDINGS: Chronic cardiomegaly and aortic tortuosity. The patient has undergone CABG and left atrial appendage clipping.  Hyperinflation which is chronic. There is no edema, consolidation, effusion, or pneumothorax. No acute osseous findings.  IMPRESSION: Stable exam.  No evidence of acute cardiopulmonary disease.   Electronically Signed   By: Monte Fantasia M.D.   On: 06/09/2015 22:48   Ct Head Wo Contrast  06/09/2015   CLINICAL DATA:  Trauma--fall, headache, hematoma to RT forehead, possible loc per pt  EXAM: CT HEAD WITHOUT CONTRAST  TECHNIQUE: Contiguous axial images were obtained  from the  base of the skull through the vertex without intravenous contrast.  COMPARISON:  04/18/2015  FINDINGS: Ventricles are normal in configuration. There is ventricular and more prominent sulcal enlargement reflecting mild diffuse atrophy. No hydrocephalus.  There are no parenchymal masses or mass effect. There is no evidence of a recent cortical infarct. There is an old lacune infarct in the left thalamus. There is mild white matter hypoattenuation consistent with chronic microvascular ischemic change.  There are no extra-axial masses or abnormal fluid collections.  There is no intracranial hemorrhage.  Small right frontal scalp hematoma. There is no underlying fracture.  Visualized sinuses and mastoid air cells are clear.  IMPRESSION: 1. No acute intracranial abnormalities. 2. Atrophy, old left laminae lacune infarct and mild chronic microvascular ischemic change, stable. 3. Small right frontal scalp hematoma.   Electronically Signed   By: [name] M.D.   On: 06/09/2015 21:56      EKG Interpretation   Date/Time:  Monday June 09 2015 21:02:02 EDT Ventricular Rate:  69 PR Interval:  222 QRS Duration: 134 QT Interval:  442 QTC Calculation: 473 R Axis:   -71 Text Interpretation:  Sinus rhythm with 1st degree A-V block Left axis  deviation Right bundle branch block Moderate voltage criteria for LVH, may  be normal variant Inferior infarct , age undetermined Anterior infarct ,  age undetermined Abnormal ECG 1st degree block is new from prior Confirmed  by Debby Freiberg [phone]) on 06/09/2015 10:11:30 PM      MDM   Final diagnoses:  Fall, initial encounter  Hematoma    81 y.o. female with pertinent PMH of afib on xarelto, ckd, chf presents after mechanical fall with hit to head. No loss of consciousness, chest pain, other systemic symptoms. The patient does endorse being fatigued chronically for the last 6 months, denies that any change recently. On arrival vital signs and physical  exam as above. Workup demonstrated no acute pathology with the exception of contusion. Discussed patient results with the patient and her husband, who is a physician, and patient was discharged home with standard return precautions for head injury.    I have reviewed all laboratory and imaging studies if ordered as above  1. Fall, initial encounter   2. Hematoma           Debby Freiberg, MD 06/10/15 [phone]

## 2015-06-09 NOTE — ED Notes (Signed)
Escorted pt to bathroom x1 assist

## 2015-06-09 NOTE — Telephone Encounter (Signed)
Returned pt husbands call. He reports that pt went into Afib on Sun 8/14 @ 4pm. She has been previously weaned off Amiodarone 2 months ago. He gave 1 229m tab of Amio on Sunday, she shortly converted back to NSR. Pt was given an Amio 2073mtab today. Pt husband who is a retired phEngineer, drillingwould like Dr.Smith's instructions on how to proceed. Adv him that Dr.Smith will be in the office later this afternoon. I will update him and call back with his recommendation. Pt husband verbalized understanding.

## 2015-06-09 NOTE — Telephone Encounter (Signed)
New message     Talk to Maria [name]----pt is in AFIB.  Please advise

## 2015-06-09 NOTE — ED Notes (Signed)
MD at bedside. 

## 2015-06-10 ENCOUNTER — Telehealth: Payer: Self-pay | Admitting: Interventional Cardiology

## 2015-06-10 DIAGNOSIS — F4320 Adjustment disorder, unspecified: Secondary | ICD-10-CM | POA: Diagnosis not present

## 2015-06-10 NOTE — Telephone Encounter (Signed)
New message       Pt fell last night--went to ER.  Husband want to talk about maybe taking wife off amiodarone

## 2015-06-10 NOTE — Telephone Encounter (Signed)
Ok to refill 

## 2015-06-10 NOTE — Telephone Encounter (Signed)
We should, stay on amiodarone. A fib is worse than 1 degree AV block.

## 2015-06-10 NOTE — Telephone Encounter (Signed)
Patient's husband Dr. Donna Christen called about his wife recent fall at home and trip to the ED. Patient's CT of head was okay, but patient's EKG showed patient in 1st Degree AV Block. Dr. Colin Rhein reported to patient and her husband that patient's 1st Degree AV Block has gotten worse. Dr. Colin Rhein thinks that patient's amiodarone is making this worse. Patient's husband wants to know if taking amiodarone 100 mg daily, if it's benefits out way the risk. Will forward to Dr. [name] for advisement.

## 2015-06-11 NOTE — Telephone Encounter (Signed)
Called Patient's husband, Dr. Donna Christen, back and informed him of Dr. Thompson Caul advise. Dr. Donna Christen verbalized understanding.

## 2015-06-13 DIAGNOSIS — S0990XD Unspecified injury of head, subsequent encounter: Secondary | ICD-10-CM | POA: Diagnosis not present

## 2015-06-13 DIAGNOSIS — I4891 Unspecified atrial fibrillation: Secondary | ICD-10-CM | POA: Diagnosis not present

## 2015-06-13 DIAGNOSIS — Z9181 History of falling: Secondary | ICD-10-CM | POA: Diagnosis not present

## 2015-06-16 ENCOUNTER — Other Ambulatory Visit: Payer: Self-pay | Admitting: Interventional Cardiology

## 2015-06-19 DIAGNOSIS — F4320 Adjustment disorder, unspecified: Secondary | ICD-10-CM | POA: Diagnosis not present

## 2015-07-07 ENCOUNTER — Other Ambulatory Visit: Payer: Self-pay | Admitting: Interventional Cardiology

## 2015-07-10 DIAGNOSIS — F4320 Adjustment disorder, unspecified: Secondary | ICD-10-CM | POA: Diagnosis not present

## 2015-07-15 DIAGNOSIS — F4320 Adjustment disorder, unspecified: Secondary | ICD-10-CM | POA: Diagnosis not present

## 2015-07-18 DIAGNOSIS — R269 Unspecified abnormalities of gait and mobility: Secondary | ICD-10-CM | POA: Diagnosis not present

## 2015-07-18 DIAGNOSIS — M5430 Sciatica, unspecified side: Secondary | ICD-10-CM | POA: Diagnosis not present

## 2015-07-18 DIAGNOSIS — R51 Headache: Secondary | ICD-10-CM | POA: Diagnosis not present

## 2015-07-18 DIAGNOSIS — E611 Iron deficiency: Secondary | ICD-10-CM | POA: Diagnosis not present

## 2015-07-18 DIAGNOSIS — Z79899 Other long term (current) drug therapy: Secondary | ICD-10-CM | POA: Diagnosis not present

## 2015-07-30 DIAGNOSIS — F4320 Adjustment disorder, unspecified: Secondary | ICD-10-CM | POA: Diagnosis not present

## 2015-08-12 DIAGNOSIS — F4320 Adjustment disorder, unspecified: Secondary | ICD-10-CM | POA: Diagnosis not present

## 2015-08-14 ENCOUNTER — Telehealth: Payer: Self-pay | Admitting: Interventional Cardiology

## 2015-08-14 MED ORDER — AMIODARONE HCL 100 MG PO TABS: 200.0000 mg | ORAL_TABLET | Freq: Two times a day (BID) | ORAL | Status: DC

## 2015-08-14 NOTE — Telephone Encounter (Signed)
Pt sts husband Dr.Zender sts that pt has been in Afib for the last 48hrs. Pt husband who is also a retired physician increased her Amiodarone to 23m twice daily. Dr.Rieke sts that pt is currently asymptomatic.adv Dr.Stodghill that Dr.Smit is aware and that he says it is ok for pt to continue 2046mbid. For 2 weeks. If pat has not converted back to NSR they should call the office, adv Dr.Orlick to call sooner if pt develops symptoms. Dr.Garret agreed with plan and verbalized understanding.

## 2015-08-14 NOTE — Telephone Encounter (Signed)
Follow Up  Dr. Derek Mound the pt's husband called states that he got the dosage wrong. He says that he previously told the rep that he increased the dose to 100 mg. But he really increased it ti 219m  Twice a day. Please call back to discuss how to move forward.

## 2015-08-14 NOTE — Telephone Encounter (Signed)
New message     Calling to let Dr [name] know that Mrs Maria [name] has been in AFIB for 48hrs.  Dr Donna Christen increased her amiodarone to 15m bid.  Is this ok?

## 2015-08-21 ENCOUNTER — Telehealth: Payer: Self-pay | Admitting: Interventional Cardiology

## 2015-08-21 NOTE — Telephone Encounter (Signed)
Detailed Dr. Donna Christen that they should leave the amiodarone at 200 mg daily rather than try and go back to 100 mg.

## 2015-08-21 NOTE — Telephone Encounter (Signed)
New message     FYI for Dr [name] Oct 18 afib, increased amiodarone to  215m bid. Normal sinus rhythm 10-22. He is now cutting her amiodarone back to 200 mg once a day for 1 week, then back to 1032mdaily as a maintenance dosage.  You do not have to call unless you want to change something.

## 2015-08-22 NOTE — Telephone Encounter (Signed)
Called and spoke with Maria [name] and advised that Dr [name] wants the patient to stay on amiodarone 237m daily vs cutting back to 109m per previous plan.  Maria GaBouchillonxpressed understanding.

## 2015-08-28 ENCOUNTER — Other Ambulatory Visit: Payer: Self-pay | Admitting: Gastroenterology

## 2015-08-28 DIAGNOSIS — D509 Iron deficiency anemia, unspecified: Secondary | ICD-10-CM

## 2015-08-28 DIAGNOSIS — R195 Other fecal abnormalities: Secondary | ICD-10-CM | POA: Diagnosis not present

## 2015-08-28 DIAGNOSIS — K900 Celiac disease: Secondary | ICD-10-CM

## 2015-08-28 DIAGNOSIS — Z7901 Long term (current) use of anticoagulants: Secondary | ICD-10-CM | POA: Diagnosis not present

## 2015-08-29 DIAGNOSIS — F4320 Adjustment disorder, unspecified: Secondary | ICD-10-CM | POA: Diagnosis not present

## 2015-09-05 ENCOUNTER — Ambulatory Visit
Admission: RE | Admit: 2015-09-05 | Discharge: 2015-09-05 | Disposition: A | Discharge status: Home / Self Care | Payer: Medicare Other | Source: Ambulatory Visit | Attending: Gastroenterology | Admitting: Gastroenterology

## 2015-09-05 DIAGNOSIS — R634 Abnormal weight loss: Secondary | ICD-10-CM | POA: Diagnosis not present

## 2015-09-05 DIAGNOSIS — R110 Nausea: Secondary | ICD-10-CM | POA: Diagnosis not present

## 2015-09-05 DIAGNOSIS — D509 Iron deficiency anemia, unspecified: Secondary | ICD-10-CM

## 2015-09-05 DIAGNOSIS — K900 Celiac disease: Secondary | ICD-10-CM

## 2015-09-05 DIAGNOSIS — D649 Anemia, unspecified: Secondary | ICD-10-CM | POA: Diagnosis not present

## 2015-09-05 MED ORDER — IOPAMIDOL (ISOVUE-300) INJECTION 61%
100.0000 mL | Freq: Once | INTRAVENOUS | Status: AC | PRN
  Administered 2015-09-05: 100 mL via INTRAVENOUS

## 2015-09-08 DIAGNOSIS — D509 Iron deficiency anemia, unspecified: Secondary | ICD-10-CM | POA: Diagnosis not present

## 2015-09-08 DIAGNOSIS — I10 Essential (primary) hypertension: Secondary | ICD-10-CM | POA: Diagnosis not present

## 2015-09-08 DIAGNOSIS — I480 Paroxysmal atrial fibrillation: Secondary | ICD-10-CM | POA: Diagnosis not present

## 2015-09-08 DIAGNOSIS — R413 Other amnesia: Secondary | ICD-10-CM | POA: Diagnosis not present

## 2015-09-08 DIAGNOSIS — Z23 Encounter for immunization: Secondary | ICD-10-CM | POA: Diagnosis not present

## 2015-09-10 DIAGNOSIS — F4320 Adjustment disorder, unspecified: Secondary | ICD-10-CM | POA: Diagnosis not present

## 2015-09-12 ENCOUNTER — Other Ambulatory Visit: Payer: Self-pay | Admitting: Interventional Cardiology

## 2015-09-30 DIAGNOSIS — F4320 Adjustment disorder, unspecified: Secondary | ICD-10-CM | POA: Diagnosis not present

## 2015-10-14 DIAGNOSIS — F4320 Adjustment disorder, unspecified: Secondary | ICD-10-CM | POA: Diagnosis not present

## 2015-11-26 ENCOUNTER — Ambulatory Visit: Payer: Medicare Other | Admitting: Interventional Cardiology

## 2016-01-02 DIAGNOSIS — R269 Unspecified abnormalities of gait and mobility: Secondary | ICD-10-CM | POA: Diagnosis not present

## 2016-01-02 DIAGNOSIS — Z79899 Other long term (current) drug therapy: Secondary | ICD-10-CM | POA: Diagnosis not present

## 2016-01-02 DIAGNOSIS — R51 Headache: Secondary | ICD-10-CM | POA: Diagnosis not present

## 2016-01-08 ENCOUNTER — Other Ambulatory Visit: Payer: Self-pay | Admitting: Geriatric Medicine

## 2016-01-08 DIAGNOSIS — R51 Headache: Principal | ICD-10-CM

## 2016-01-08 DIAGNOSIS — R519 Headache, unspecified: Secondary | ICD-10-CM

## 2016-01-09 ENCOUNTER — Other Ambulatory Visit: Payer: Self-pay | Admitting: *Deleted

## 2016-01-09 MED ORDER — RIVAROXABAN 15 MG PO TABS: ORAL_TABLET | ORAL | Status: DC

## 2016-01-14 ENCOUNTER — Telehealth: Payer: Self-pay | Admitting: Interventional Cardiology

## 2016-01-14 DIAGNOSIS — F4320 Adjustment disorder, unspecified: Secondary | ICD-10-CM | POA: Diagnosis not present

## 2016-01-14 NOTE — Telephone Encounter (Signed)
New Message  Pt daughter called to follow up. Dr. Servando Snare did the surgery But Dr. [name] is the cardiologist. Request a call back to determine if the pt has a metal piece in her heart and if it is safe to have the MRI

## 2016-01-14 NOTE — Telephone Encounter (Signed)
New message     Pt is scheduled for a MRI on this Friday.  Patient told them that Dr Maria [name] implanted a metal clip in her chest.  They are calling to get more info and also to see if she can have an MRI.  Please call

## 2016-01-14 NOTE — Telephone Encounter (Signed)
Return call to pt daughter. Adv her that per Dr.Smith he would defer that question to Dr.[name] who did the pt's bypass. The pt did have an atrial clip,Dr.Smith doesn't think there would be a issue with the pt having an MRI, but  Dr.[name] could probably answer that question definitively.  Pt daughter voiced appreciation for the call, and will up with Dr.[name]'s office.  Condolences on the recent passing of the pt husband.

## 2016-01-16 ENCOUNTER — Ambulatory Visit
Admission: RE | Admit: 2016-01-16 | Discharge: 2016-01-16 | Disposition: A | Discharge status: Home / Self Care | Payer: Medicare Other | Source: Ambulatory Visit | Attending: Geriatric Medicine | Admitting: Geriatric Medicine

## 2016-01-16 DIAGNOSIS — R51 Headache: Principal | ICD-10-CM

## 2016-01-16 DIAGNOSIS — R519 Headache, unspecified: Secondary | ICD-10-CM

## 2016-01-16 MED ORDER — GADOBENATE DIMEGLUMINE 529 MG/ML IV SOLN
9.0000 mL | Freq: Once | INTRAVENOUS | Status: AC | PRN
  Administered 2016-01-16: 9 mL via INTRAVENOUS

## 2016-01-16 NOTE — Telephone Encounter (Signed)
Maria [name] is calling in because the pt is scheduled to have an MRI done today and she has a heart clamp that Dr. [name] put in a few years ago. She wants to know if this is MRI compatible.

## 2016-01-16 NOTE — Telephone Encounter (Signed)
Notified Holly at [facility] that Dr. Servando Snare would have to approve the MRI per Dr. [name].  States that they have been in contact with his office and she has the information.

## 2016-01-26 ENCOUNTER — Ambulatory Visit (INDEPENDENT_AMBULATORY_CARE_PROVIDER_SITE_OTHER): Payer: Medicare Other | Admitting: Interventional Cardiology

## 2016-01-26 ENCOUNTER — Encounter: Payer: Self-pay | Admitting: Interventional Cardiology

## 2016-01-26 VITALS — BP 154/62 | HR 64 | Ht 62.5 in | Wt 103.6 lb

## 2016-01-26 DIAGNOSIS — Z951 Presence of aortocoronary bypass graft: Secondary | ICD-10-CM | POA: Diagnosis not present

## 2016-01-26 DIAGNOSIS — Z7901 Long term (current) use of anticoagulants: Secondary | ICD-10-CM

## 2016-01-26 DIAGNOSIS — Z5181 Encounter for therapeutic drug level monitoring: Secondary | ICD-10-CM | POA: Diagnosis not present

## 2016-01-26 DIAGNOSIS — I739 Peripheral vascular disease, unspecified: Secondary | ICD-10-CM

## 2016-01-26 DIAGNOSIS — I779 Disorder of arteries and arterioles, unspecified: Secondary | ICD-10-CM

## 2016-01-26 DIAGNOSIS — I480 Paroxysmal atrial fibrillation: Secondary | ICD-10-CM

## 2016-01-26 DIAGNOSIS — R42 Dizziness and giddiness: Secondary | ICD-10-CM

## 2016-01-26 DIAGNOSIS — Z79899 Other long term (current) drug therapy: Secondary | ICD-10-CM

## 2016-01-26 NOTE — Progress Notes (Signed)
Cardiology Office Note   Date:  01/26/2016   ID:  [name], DOB 04/04/1924, MRN [id number]  PCP:  Maria Argyle, MD  Cardiologist:  [name] Grooms, MD   Chief Complaint  Patient presents with  . Coronary Artery Disease      History of Present Illness: Maria [name] is a 82 y.o. female who presents for CAD with prior bypass surgery, amiodarone therapy, paroxysmal atrial fibrillation, hypertension, right carotid artery disease, essential hypertension, and chronic anticoagulation therapy. Prior history of DVT.  When she looks up heart makes a sudden turn and she gets dizzy. She denies nausea and vomiting. Recent MRI did not reveal a structural abnormality. This was conducted under the watch of Dr. Lajean [name].  She denies angina.  She has not had tachycardia palpitations or episodes of atrial fibrillation. She does not know her amiodarone tablets for 100-200 mg tablets.  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 She is having difficulty emotionally. There were married greater than 60 years.  Past Medical History  Diagnosis Date  . Osteoporosis   . Celiac disease   . Carotid artery occlusion     right carotid stenosis 40-60%, 4/08, neg for stenosis repeat study 11/11  . Depression   . Presbycusis   . Macular degeneration   . Urethral stricture   . Mild aortic insufficiency     a. Echo 10/2013: mild AI.  Marland Kitchen Moderate mitral regurgitation by prior echocardiogram     a. Echo 10/2013.  Marland Kitchen Thyroid nodule     55m, no change 11/11  . RLS (restless legs syndrome)   . Vitamin D deficiency   . Pulmonary hypertension ([place]   . Hypertension   . PAF (paroxysmal atrial fibrillation) ([place]   . Coronary artery disease     a.  08/2013 (LIMA-LAD, rSVG to LCX and Lt atrial clip),  . Chronic diastolic CHF (congestive heart failure) (HCC)     takes Lasix daily  . MVP (mitral valve prolapse)   . Moderate obstructive sleep apnea     uses CPAP;sleep  study about 4-57monthago  . GERD (gastroesophageal reflux disease)     takes Omeprazole daily  . Gastric ulcer     6-66m4monthgo  . GI bleeding   . Hemorrhoids   . Urinary retention   . Urinary anastomotic stricture   . Hypothyroidism     takes Synthroid daily as result of AMiodarone  . Hot flashes     takes ZOloft 3 times a week  . Insomnia   . Peripheral edema     left  . DVT (deep venous thrombosis) (HCCBrandonville2/2014  . Atonic bladder 12/11/2013  . Chronic anticoagulation CHADS2VASC2 score 7 10/18/2014  . Anemia   . CKD (chronic kidney disease), stage III   . [facility]rial fibrillation ([facility]   Past Surgical History  Procedure Laterality Date  . Appendectomy    . Tubal ligation    . Tonsillectomy    . Trigger thumb    . Cardiac catheterization  08-06-13  . Tcs    . Bilateral cataract surgery    . Mandible surgery    . Coronary artery bypass graft N/A 08/28/2013    Procedure: CORONARY ARTERY BYPASS GRAFTING (CABG) x2 using right greater saphenous vein and left internal mammary artery. ;  Surgeon: EdwGrace IsaacD;  [facility];  Laterality: N/A;  . Intraoperative transesophageal echocardiogram N/A 08/28/2013  Procedure: INTRAOPERATIVE TRANSESOPHAGEAL ECHOCARDIOGRAM;  Surgeon: Grace Isaac, MD;  Location: [place];  Service: Open Heart Surgery;  Laterality: N/A;  . Hip arthroplasty Right 12/09/2013    Procedure: ARTHROPLASTY BIPOLAR HIP CEMENTED;  Surgeon: Kerin Salen, MD;  Location: [place];  Service: Orthopedics;  Laterality: Right;  . Left and right heart catheterization with coronary angiogram N/A 08/16/2013    Procedure: LEFT AND RIGHT HEART CATHETERIZATION WITH CORONARY ANGIOGRAM;  Surgeon: [name] Grooms, MD;  Location[facility] CATH LAB;  Service: Cardiovascular;  Laterality: N/A;     Current Outpatient Prescriptions  Medication Sig Dispense Refill  . acetaminophen (TYLENOL) 325 MG tablet Take 650 mg by mouth every 6 (six) hours as  needed.    Marland Kitchen amiodarone (PACERONE) 100 MG tablet Take 2 tablets (200 mg total) by mouth 2 (two) times daily.    Marland Kitchen amLODipine (NORVASC) 5 MG tablet Take 5 mg by mouth every Monday, Wednesday, and Friday.    . Cholecalciferol 1000 UNITS tablet Take 1,000 Units by mouth daily.    . furosemide (LASIX) 20 MG tablet Take 20 mg by mouth daily.    Marland Kitchen levothyroxine (SYNTHROID, LEVOTHROID) 50 MCG tablet Take 1 tablet (50 mcg total) by mouth daily before breakfast. 30 tablet 11  . MULTIPLE VITAMIN PO Take 1 tablet by mouth daily.    . polyethylene glycol (MIRALAX / GLYCOLAX) packet Take 17 g by mouth daily.    . Rivaroxaban (XARELTO) 15 MG TABS tablet TAKE ONE TABLET BY MOUTH ONCE DAILY WITH SUPPER 90 tablet 0  . sertraline (ZOLOFT) 50 MG tablet Take 25 mg by mouth daily.    Marland Kitchen zolpidem (AMBIEN) 5 MG tablet Take 5 mg by mouth at bedtime.      No current facility-administered medications for this visit.    Allergies:   Amoxicillin; Codeine; Fosamax; Gluten meal; Hctz; and Tetanus toxoids    Social History:  The patient  reports that she has never smoked. She has never used smokeless tobacco. She reports that she does not drink alcohol or use illicit drugs.   Family History:  The patient's family history includes AAA (abdominal aortic aneurysm) in her brother; CAD in her father; CVA in her mother; Colon cancer in her father; Heart attack in her brother, father, and mother; Peripheral vascular disease in her brother.    ROS:  Please see the history of present illness.   Otherwise, review of systems are positive for Muscle pain, easy bruising, constipation, nausea, cough, vision disturbance, unexplained weight loss, depression and grief, headache, and difficulty with balance. She feels as though there is swelling or expansion in her head..   All other systems are reviewed and negative.    PHYSICAL EXAM: VS:  BP 154/62 mmHg  Pulse 64  Ht 5' 2.5" (1.588 m)  Wt 103 lb 9.6 oz (46.993 kg)  BMI 18.64 kg/m2 ,  BMI Body mass index is 18.64 kg/(m^2). GEN: Well nourished, well developed, in no acute distress HEENT: normal Neck: no JVD; there is a loud right carotid bruit. No masses are noted. Cardiac: RRR.  There is 2/6 SEM, but no rub, or gallop. There is no edema. Respiratory:  clear to auscultation bilaterally, normal work of breathing. GI: soft, nontender, nondistended, + BS MS: no deformity or atrophy Skin: warm and dry, no rash Neuro:  Strength and sensation are intact Psych: euthymic mood, full affect   EKG:  EKG is  ordered today. The ekg reveals normal sinus rhythm, right bundle branch  block, left anterior hemiblock. No change compared to prior.   Recent Labs: 06/09/2015: B Natriuretic Peptide 125.3*; BUN 13; Creatinine, Ser 0.55; Hemoglobin 13.2; Platelets 284; Potassium 4.1; Sodium 133*    Lipid Panel    Component Value Date/Time   CHOL 173 10/19/2014 0315   TRIG 85 10/19/2014 0315   HDL 54 10/19/2014 0315   CHOLHDL 3.2 10/19/2014 0315   VLDL 17 10/19/2014 0315   LDLCALC 102* 10/19/2014 0315      Wt Readings from Last 3 Encounters:  01/26/16 103 lb 9.6 oz (46.993 kg)  06/09/15 107 lb (48.535 kg)  05/06/15 107 lb 12.8 oz (48.898 kg)   Other studies Reviewed: Additional studies/ records that were reviewed today include: MRI done in 01/16/2016. The findings include evidence of small vessel disease..    ASSESSMENT AND PLAN:  1. Paroxysmal atrial fibrillation (HCC) On amiodarone. Current dose is uncertain. - EKG 12-Lead  2. Hx of CABG Stable without angina  3. Carotid disease, bilateral (HCC) Loud right carotid bruit  4. Anticoagulation goal of INR 2.5 to 3.5 No bleeding complications on relatively low dose Xarelto  5. On amiodarone therapy Discover the dose either 100 200 mg per day.  6. Vertigo I believe this is the cause of her dizziness.    Current medicines are reviewed at length with the patient today.  The patient has the following concerns  regarding medicines: .  The following changes/actions have been instituted:    I asked her to discuss vertigo with her primary physician, Dr. Felipa Eth.  No change in current medical regimen  Call us with the exact dose/size of her amiodarone tablets.  Labs/ tests ordered today include:  Orders Placed This Encounter  Procedures  . EKG 12-Lead     Disposition:   FU with HS in 6 months  Signed, [name] Grooms, MD  01/26/2016 3:57 PM    [address], [place], [place]  [address] Phone: [phone]; Fax: [phone]

## 2016-01-26 NOTE — Patient Instructions (Signed)
Medication Instructions:  Your physician recommends that you continue on your current medications as directed. Please refer to the Current Medication list given to you today.   Labwork: None ordered  Testing/Procedures: None ordered  Follow-Up: Your physician wants you to follow-up in: 6-9 months with Dr.Smith You will receive a reminder letter in the mail two months in advance. If you don't receive a letter, please call our office to schedule the follow-up appointment.   Any Other Special Instructions Will Be Listed Below (If Applicable).     If you need a refill on your cardiac medications before your next appointment, please call your pharmacy.

## 2016-03-02 ENCOUNTER — Encounter (HOSPITAL_COMMUNITY): Payer: Self-pay | Admitting: Emergency Medicine

## 2016-03-02 ENCOUNTER — Emergency Department (HOSPITAL_COMMUNITY): Payer: Medicare Other

## 2016-03-02 ENCOUNTER — Emergency Department (HOSPITAL_COMMUNITY)
Admission: EM | Admit: 2016-03-02 | Discharge: 2016-03-02 | Disposition: A | Discharge status: Home / Self Care | Payer: Medicare Other | Attending: Emergency Medicine | Admitting: Emergency Medicine

## 2016-03-02 DIAGNOSIS — Z79899 Other long term (current) drug therapy: Secondary | ICD-10-CM | POA: Insufficient documentation

## 2016-03-02 DIAGNOSIS — I5032 Chronic diastolic (congestive) heart failure: Secondary | ICD-10-CM | POA: Insufficient documentation

## 2016-03-02 DIAGNOSIS — K219 Gastro-esophageal reflux disease without esophagitis: Secondary | ICD-10-CM | POA: Diagnosis not present

## 2016-03-02 DIAGNOSIS — I480 Paroxysmal atrial fibrillation: Secondary | ICD-10-CM | POA: Diagnosis not present

## 2016-03-02 DIAGNOSIS — M810 Age-related osteoporosis without current pathological fracture: Secondary | ICD-10-CM | POA: Diagnosis not present

## 2016-03-02 DIAGNOSIS — S0990XA Unspecified injury of head, initial encounter: Secondary | ICD-10-CM

## 2016-03-02 DIAGNOSIS — G4700 Insomnia, unspecified: Secondary | ICD-10-CM | POA: Insufficient documentation

## 2016-03-02 DIAGNOSIS — G4733 Obstructive sleep apnea (adult) (pediatric): Secondary | ICD-10-CM | POA: Insufficient documentation

## 2016-03-02 DIAGNOSIS — Y998 Other external cause status: Secondary | ICD-10-CM | POA: Diagnosis not present

## 2016-03-02 DIAGNOSIS — S199XXA Unspecified injury of neck, initial encounter: Secondary | ICD-10-CM | POA: Diagnosis not present

## 2016-03-02 DIAGNOSIS — W07XXXA Fall from chair, initial encounter: Secondary | ICD-10-CM | POA: Diagnosis not present

## 2016-03-02 DIAGNOSIS — Z9889 Other specified postprocedural states: Secondary | ICD-10-CM | POA: Insufficient documentation

## 2016-03-02 DIAGNOSIS — Z951 Presence of aortocoronary bypass graft: Secondary | ICD-10-CM | POA: Insufficient documentation

## 2016-03-02 DIAGNOSIS — I129 Hypertensive chronic kidney disease with stage 1 through stage 4 chronic kidney disease, or unspecified chronic kidney disease: Secondary | ICD-10-CM | POA: Diagnosis not present

## 2016-03-02 DIAGNOSIS — Y9289 Other specified places as the place of occurrence of the external cause: Secondary | ICD-10-CM | POA: Diagnosis not present

## 2016-03-02 DIAGNOSIS — Y9389 Activity, other specified: Secondary | ICD-10-CM | POA: Insufficient documentation

## 2016-03-02 DIAGNOSIS — Z7901 Long term (current) use of anticoagulants: Secondary | ICD-10-CM | POA: Diagnosis not present

## 2016-03-02 DIAGNOSIS — S0001XA Abrasion of scalp, initial encounter: Secondary | ICD-10-CM | POA: Diagnosis not present

## 2016-03-02 DIAGNOSIS — Z880 Allergy status to penicillin: Secondary | ICD-10-CM | POA: Insufficient documentation

## 2016-03-02 DIAGNOSIS — D649 Anemia, unspecified: Secondary | ICD-10-CM | POA: Diagnosis not present

## 2016-03-02 DIAGNOSIS — G2581 Restless legs syndrome: Secondary | ICD-10-CM | POA: Diagnosis not present

## 2016-03-02 DIAGNOSIS — I2510 Atherosclerotic heart disease of native coronary artery without angina pectoris: Secondary | ICD-10-CM | POA: Diagnosis not present

## 2016-03-02 DIAGNOSIS — Z86718 Personal history of other venous thrombosis and embolism: Secondary | ICD-10-CM | POA: Insufficient documentation

## 2016-03-02 DIAGNOSIS — E039 Hypothyroidism, unspecified: Secondary | ICD-10-CM | POA: Diagnosis not present

## 2016-03-02 DIAGNOSIS — S0181XA Laceration without foreign body of other part of head, initial encounter: Secondary | ICD-10-CM | POA: Diagnosis not present

## 2016-03-02 DIAGNOSIS — N183 Chronic kidney disease, stage 3 (moderate): Secondary | ICD-10-CM | POA: Diagnosis not present

## 2016-03-02 NOTE — ED Provider Notes (Signed)
CSN: [id number]     Arrival date & time 03/02/16  0132 History   First MD Initiated Contact with Patient 03/02/16 0205     Chief Complaint  Patient presents with  . Fall     (Consider location/radiation/quality/duration/timing/severity/associated sxs/prior Treatment) HPI Comments: 82 year old female with extensive past medical history including CAD, DVT on Xarelto, CHF, CKD who p/w head injury. Just prior to arrival, the patient leaned forward in a chair and fell forward, striking the top of her head. She had taken an Ambien earlier in the night. She denies any loss of consciousness and recalls the entire event. She reports a lot of bleeding initially but the bleeding has stopped. No visual changes, extremity weakness/numbness, or other injuries. She reports some mild left-sided neck muscle pain which she suspects is from the initial injury. She reports pain on her head immediately around the laceration site. No nausea or vomiting.  Patient is a 82 y.o. female presenting with fall. The history is provided by the patient.  Fall    Past Medical History  Diagnosis Date  . Osteoporosis   . Celiac disease   . Carotid artery occlusion     right carotid stenosis 40-60%, 4/08, neg for stenosis repeat study 11/11  . Depression   . Presbycusis   . Macular degeneration   . Urethral stricture   . Mild aortic insufficiency     a. Echo 10/2013: mild AI.  Marland Kitchen Moderate mitral regurgitation by prior echocardiogram     a. Echo 10/2013.  Marland Kitchen Thyroid nodule     2101m, no change 11/11  . RLS (restless legs syndrome)   . Vitamin D deficiency   . Pulmonary hypertension ([place]   . Hypertension   . PAF (paroxysmal atrial fibrillation) (HG[place]   . Coronary artery disease     a.  08/2013 (LIMA-LAD, rSVG to LCX and Lt atrial clip),  . Chronic diastolic CHF (congestive heart failure) (HCC)     takes Lasix daily  . MVP (mitral valve prolapse)   . Moderate obstructive sleep apnea     uses CPAP;sleep study about  4-549monthago  . GERD (gastroesophageal reflux disease)     takes Omeprazole daily  . Gastric ulcer     6-101m2101monthgo  . GI bleeding   . Hemorrhoids   . Urinary retention   . Urinary anastomotic stricture   . Hypothyroidism     takes Synthroid daily as result of AMiodarone  . Hot flashes     takes ZOloft 3 times a week  . Insomnia   . Peripheral edema     left  . DVT (deep venous thrombosis) (HCCEast Wenatchee2/2014  . Atonic bladder 12/11/2013  . Chronic anticoagulation CHADS2VASC2 score 7 10/18/2014  . Anemia   . CKD (chronic kidney disease), stage III   . [facility] ([facility]  Past Surgical History  Procedure Laterality Date  . Appendectomy    . Tubal ligation    . Tonsillectomy    . Trigger thumb    . Cardiac catheterization  08-06-13  . Tcs    . Bilateral cataract surgery    . Mandible surgery    . Coronary artery bypass graft N/A 08/28/2013    Procedure: CORONARY ARTERY BYPASS GRAFTING (CABG) x2 using right greater saphenous vein and left internal mammary artery. ;  Surgeon: EdwGrace IsaacD;  [facility];  Laterality: N/A;  . Intraoperative transesophageal echocardiogram N/A 08/28/2013    Procedure:  INTRAOPERATIVE TRANSESOPHAGEAL ECHOCARDIOGRAM;  Surgeon: Grace Isaac, MD;  Location: [place];  Service: Open Heart Surgery;  Laterality: N/A;  . Hip arthroplasty Right 12/09/2013    Procedure: ARTHROPLASTY BIPOLAR HIP CEMENTED;  Surgeon: Kerin Salen, MD;  Location: [place];  Service: Orthopedics;  Laterality: Right;  . Left and right heart catheterization with coronary angiogram N/A 08/16/2013    Procedure: LEFT AND RIGHT HEART CATHETERIZATION WITH CORONARY ANGIOGRAM;  Surgeon: [name], MD;  Location: [facility] C[facility] LAB;  Service: Cardiovascular;  Laterality: N/A;   Family History  Problem Relation Age of Onset  . Heart attack Mother   . CVA Mother   . CAD Father   . Colon cancer Father   . Heart attack Father   . Heart attack  Brother   . Peripheral vascular disease Brother   . AAA (abdominal aortic aneurysm) Brother    Social History  Substance Use Topics  . Smoking status: Never Smoker   . Smokeless tobacco: Never Used  . Alcohol Use: No   OB History    No data available     Review of Systems 10 Systems reviewed and are negative for acute change except as noted in the HPI.    Allergies  Amoxicillin; Codeine; Fosamax; Gluten meal; Hctz; and Tetanus toxoids  Home Medications   Prior to Admission medications   Medication Sig Start Date End Date Taking? Authorizing Provider  acetaminophen (TYLENOL) 325 MG tablet Take 650 mg by mouth every 6 (six) hours as needed.   Yes Historical Provider, MD  amiodarone (PACERONE) 100 MG tablet Take 2 tablets (200 mg total) by mouth 2 (two) times daily. Patient taking differently: Take 100 mg by mouth daily.  08/14/15  Yes Belva Crome, MD  amLODipine (NORVASC) 5 MG tablet Take 5 mg by mouth every Monday, Wednesday, and Friday. 04/24/15  Yes Historical Provider, MD  Cholecalciferol 1000 UNITS tablet Take 1,000 Units by mouth daily.   Yes Historical Provider, MD  ferrous sulfate 325 (65 FE) MG tablet Take 325 mg by mouth daily with breakfast.   Yes Historical Provider, MD  furosemide (LASIX) 20 MG tablet Take 20 mg by mouth daily.   Yes Historical Provider, MD  levothyroxine (SYNTHROID, LEVOTHROID) 50 MCG tablet Take 1 tablet (50 mcg total) by mouth daily before breakfast. 01/03/14  Yes Daniel J Angiulli, PA-C  MULTIPLE VITAMIN PO Take 1 tablet by mouth daily.   Yes Historical Provider, MD  polyethylene glycol (MIRALAX / GLYCOLAX) packet Take 17 g by mouth daily.   Yes Historical Provider, MD  Rivaroxaban (XARELTO) 15 MG TABS tablet TAKE ONE TABLET BY MOUTH ONCE DAILY WITH SUPPER 01/09/16  Yes Belva Crome, MD  sertraline (ZOLOFT) 50 MG tablet Take 25 mg by mouth daily. 12/08/15  Yes Historical Provider, MD  zolpidem (AMBIEN) 5 MG tablet Take 5 mg by mouth at bedtime.   11/07/14  Yes Historical Provider, MD   BP 143/55 mmHg  Pulse 62  Temp(Src) 97.5 F (36.4 C) (Oral)  Resp 20  SpO2 100% Physical Exam  Constitutional: She is oriented to person, place, and time. She appears well-developed and well-nourished. No distress.  Awake, alert  HENT:  Head: Normocephalic and atraumatic.  Eyes: Conjunctivae and EOM are normal. Pupils are equal, round, and reactive to light.  Neck: Normal range of motion. Neck supple.  No midline spinal TTP; + L cervical paraspinal muscle tenderness  Cardiovascular: Normal rate, regular rhythm and normal heart sounds.  No murmur heard. Pulmonary/Chest: Effort normal and breath sounds normal. No respiratory distress.  Abdominal: Soft. Bowel sounds are normal. She exhibits no distension.  Musculoskeletal: She exhibits no edema.  Neurological: She is alert and oriented to person, place, and time. She has normal reflexes. No cranial nerve deficit. She exhibits normal muscle tone.  Fluent speech, normal finger-to-nose testing, negative pronator drift, no clonus 5/5 strength and normal sensation x all 4 extremities  Skin: Skin is warm and dry.  0.5 cm superficial laceration on top of scalp w/ no active bleeding, mild TTP surrounding cut  Psychiatric: She has a normal mood and affect. Judgment and thought content normal.  Nursing note and vitals reviewed.   ED Course  Procedures (including critical care time) Labs Review Labs Reviewed - No data to display  Imaging Review Ct Head Wo Contrast  03/02/2016  CLINICAL DATA:  [place] forward out of her chair, striking head. EXAM: CT HEAD WITHOUT CONTRAST CT CERVICAL SPINE WITHOUT CONTRAST TECHNIQUE: Multidetector CT imaging of the head and cervical spine was performed following the standard protocol without intravenous contrast. Multiplanar CT image reconstructions of the cervical spine were also generated. COMPARISON:  01/16/2016 FINDINGS: CT HEAD FINDINGS There is no intracranial  hemorrhage, mass or evidence of acute infarction. There is moderate generalized atrophy. There is moderate chronic microvascular ischemic change. There is no significant extra-axial fluid collection. No acute intracranial findings are evident. The calvarium and skullbase are intact. The visible paranasal sinuses and orbits are unremarkable. CT CERVICAL SPINE FINDINGS The vertebral column, pedicles and facet articulations are intact. There is no evidence of acute fracture. No acute soft tissue abnormalities are evident. Moderate degenerative cervical disc disease is present from C3 through C7. No bone lesion or bony destruction. IMPRESSION: 1. No acute intracranial findings. There is moderate generalized atrophy and chronic appearing white matter hypodensities which likely represent small vessel ischemic disease. 2. Negative for acute cervical spine fracture Electronically Signed   By: Andreas Newport M.D.   On: 03/02/2016 02:33   Ct Cervical Spine Wo Contrast  03/02/2016  CLINICAL DATA:  [place] forward out of her chair, striking head. EXAM: CT HEAD WITHOUT CONTRAST CT CERVICAL SPINE WITHOUT CONTRAST TECHNIQUE: Multidetector CT imaging of the head and cervical spine was performed following the standard protocol without intravenous contrast. Multiplanar CT image reconstructions of the cervical spine were also generated. COMPARISON:  01/16/2016 FINDINGS: CT HEAD FINDINGS There is no intracranial hemorrhage, mass or evidence of acute infarction. There is moderate generalized atrophy. There is moderate chronic microvascular ischemic change. There is no significant extra-axial fluid collection. No acute intracranial findings are evident. The calvarium and skullbase are intact. The visible paranasal sinuses and orbits are unremarkable. CT CERVICAL SPINE FINDINGS The vertebral column, pedicles and facet articulations are intact. There is no evidence of acute fracture. No acute soft tissue abnormalities are evident.  Moderate degenerative cervical disc disease is present from C3 through C7. No bone lesion or bony destruction. IMPRESSION: 1. No acute intracranial findings. There is moderate generalized atrophy and chronic appearing white matter hypodensities which likely represent small vessel ischemic disease. 2. Negative for acute cervical spine fracture Electronically Signed   By: Andreas Newport M.D.   On: 03/02/2016 02:33      MDM   Final diagnoses:  Head injury, initial encounter   Pt on Xarelto p/w head injury After she bumped her head when she leaned forward in a chair this evening. On exam, she was well-appearing, awake and alert, normal  neurologic exam. No other complaints aside for pain on her scalp. Obtained CT of head and C-spine which showed no acute injury. I discussed findings with the patient and her daughter. She has remained stable for a few hours here without any neurologic symptoms, therefore I feel she is safe for discharge home but I have counseled on her risk of delayed head bleed and have instructed them to seek immediate medical attention for any new neurologic symptoms. Regarding the patient's tetanus, she is unsure whether she is up to date but states that she has had a reaction to it in the past and requires alternate dosing. I've instructed to contact PCP in the morning for further instruction regarding possible vaccination. Daughter and patient voice understanding and patient was discharged in satisfactory condition.  Sharlett Iles, MD 03/02/16 [phone]

## 2016-03-02 NOTE — ED Provider Notes (Signed)
MSE was initiated and I personally evaluated the patient and placed orders (if any) at  2:04 AM on Mar 02, 2016.  The patient appears stable so that the remainder of the MSE may be completed by another provider.  89yF with mechanical fall. Golden Circle forward out of chair. Struck head. No LOC. Small laceration to top of head. No active bleeding. No acute neurological complaints, but she is on xarelto. Mild lateral neck pain. No midline spinal tenderness. Will image.   Virgel Manifold, MD 03/02/16 [phone]

## 2016-03-02 NOTE — ED Notes (Signed)
Bed: BP79 Expected date:  Expected time:  Means of arrival:  Comments: 82 yo F  Fall, head injury

## 2016-03-02 NOTE — ED Notes (Signed)
Patient presents from [facility] via EMS for fall. Per EMS patient took sleeping pill earlier this evening, leaned forward in chair and fell, bleeding noted to forehead, anticoagulant therapy, sent out for evaluation. Denies LOC.   Last VS: 168/71, 97hr, 18resp, 98%ra.

## 2016-03-02 NOTE — ED Notes (Addendum)
Patient with 1/4" laceration to crown, bleeding controlled at this time, c/o pain to same. Denies LOC, neck pain, double or blurred vision. A&O x4.

## 2016-03-12 DIAGNOSIS — I10 Essential (primary) hypertension: Secondary | ICD-10-CM | POA: Diagnosis not present

## 2016-03-12 DIAGNOSIS — R269 Unspecified abnormalities of gait and mobility: Secondary | ICD-10-CM | POA: Diagnosis not present

## 2016-03-12 DIAGNOSIS — G4733 Obstructive sleep apnea (adult) (pediatric): Secondary | ICD-10-CM | POA: Diagnosis not present

## 2016-03-12 DIAGNOSIS — F5101 Primary insomnia: Secondary | ICD-10-CM | POA: Diagnosis not present

## 2016-03-12 DIAGNOSIS — I480 Paroxysmal atrial fibrillation: Secondary | ICD-10-CM | POA: Diagnosis not present

## 2016-03-17 DIAGNOSIS — R42 Dizziness and giddiness: Secondary | ICD-10-CM | POA: Diagnosis not present

## 2016-03-17 DIAGNOSIS — H81399 Other peripheral vertigo, unspecified ear: Secondary | ICD-10-CM | POA: Diagnosis not present

## 2016-03-19 DIAGNOSIS — R42 Dizziness and giddiness: Secondary | ICD-10-CM | POA: Diagnosis not present

## 2016-03-19 DIAGNOSIS — H81399 Other peripheral vertigo, unspecified ear: Secondary | ICD-10-CM | POA: Diagnosis not present

## 2016-03-23 DIAGNOSIS — H81399 Other peripheral vertigo, unspecified ear: Secondary | ICD-10-CM | POA: Diagnosis not present

## 2016-03-23 DIAGNOSIS — R42 Dizziness and giddiness: Secondary | ICD-10-CM | POA: Diagnosis not present

## 2016-03-24 DIAGNOSIS — H81399 Other peripheral vertigo, unspecified ear: Secondary | ICD-10-CM | POA: Diagnosis not present

## 2016-03-24 DIAGNOSIS — R42 Dizziness and giddiness: Secondary | ICD-10-CM | POA: Diagnosis not present

## 2016-03-30 DIAGNOSIS — R42 Dizziness and giddiness: Secondary | ICD-10-CM | POA: Diagnosis not present

## 2016-03-30 DIAGNOSIS — H81399 Other peripheral vertigo, unspecified ear: Secondary | ICD-10-CM | POA: Diagnosis not present

## 2016-04-01 DIAGNOSIS — H81399 Other peripheral vertigo, unspecified ear: Secondary | ICD-10-CM | POA: Diagnosis not present

## 2016-04-01 DIAGNOSIS — R42 Dizziness and giddiness: Secondary | ICD-10-CM | POA: Diagnosis not present

## 2016-04-05 DIAGNOSIS — R42 Dizziness and giddiness: Secondary | ICD-10-CM | POA: Diagnosis not present

## 2016-04-05 DIAGNOSIS — H81399 Other peripheral vertigo, unspecified ear: Secondary | ICD-10-CM | POA: Diagnosis not present

## 2016-04-14 DIAGNOSIS — R197 Diarrhea, unspecified: Secondary | ICD-10-CM | POA: Diagnosis not present

## 2016-04-14 DIAGNOSIS — I872 Venous insufficiency (chronic) (peripheral): Secondary | ICD-10-CM | POA: Diagnosis not present

## 2016-04-14 DIAGNOSIS — R42 Dizziness and giddiness: Secondary | ICD-10-CM | POA: Diagnosis not present

## 2016-04-15 DIAGNOSIS — H16101 Unspecified superficial keratitis, right eye: Secondary | ICD-10-CM | POA: Diagnosis not present

## 2016-04-15 DIAGNOSIS — H43813 Vitreous degeneration, bilateral: Secondary | ICD-10-CM | POA: Diagnosis not present

## 2016-04-15 DIAGNOSIS — H04123 Dry eye syndrome of bilateral lacrimal glands: Secondary | ICD-10-CM | POA: Diagnosis not present

## 2016-04-15 DIAGNOSIS — Z0100 Encounter for examination of eyes and vision without abnormal findings: Secondary | ICD-10-CM | POA: Diagnosis not present

## 2016-05-05 ENCOUNTER — Encounter: Payer: Self-pay | Admitting: *Deleted

## 2016-05-05 ENCOUNTER — Emergency Department (INDEPENDENT_AMBULATORY_CARE_PROVIDER_SITE_OTHER): Payer: Medicare Other

## 2016-05-05 ENCOUNTER — Emergency Department (INDEPENDENT_AMBULATORY_CARE_PROVIDER_SITE_OTHER)
Admission: EM | Admit: 2016-05-05 | Discharge: 2016-05-05 | Disposition: A | Discharge status: Home / Self Care | Payer: Medicare Other | Source: Home / Self Care | Attending: Family Medicine | Admitting: Family Medicine

## 2016-05-05 DIAGNOSIS — Z96641 Presence of right artificial hip joint: Secondary | ICD-10-CM | POA: Diagnosis not present

## 2016-05-05 DIAGNOSIS — M25561 Pain in right knee: Secondary | ICD-10-CM | POA: Diagnosis not present

## 2016-05-05 DIAGNOSIS — S79911A Unspecified injury of right hip, initial encounter: Secondary | ICD-10-CM | POA: Diagnosis not present

## 2016-05-05 DIAGNOSIS — S8391XA Sprain of unspecified site of right knee, initial encounter: Secondary | ICD-10-CM

## 2016-05-05 DIAGNOSIS — M25551 Pain in right hip: Secondary | ICD-10-CM | POA: Diagnosis not present

## 2016-05-05 NOTE — ED Provider Notes (Signed)
CSN: [id number]     Arrival date & time 05/05/16  1520 History   First MD Initiated Contact with Patient 05/05/16 1550     Chief Complaint  Patient presents with  . Knee Injury      HPI Comments: This morning at about 9:30am while bending over, patient became off-balance and fell, twisting her right knee.  No loss of consciousness.  She has a history of right hip replacement, and has also noticed mild stiffness in her right hip.  Patient is a 82 y.o. female presenting with knee pain. The history is provided by the patient.  Knee Pain Location:  Knee, hip and buttock Time since incident:  6 hours Injury: yes   Mechanism of injury: fall   Fall:    Fall occurred: while bending over.   Impact surface:  Hard floor   Point of impact:  Back   Entrapped after fall: no   Hip location:  R hip Buttock location:  R buttock Knee location:  R knee Pain details:    Quality:  Aching   Radiates to:  Does not radiate   Severity:  Mild   Onset quality:  Sudden   Duration:  6 hours   Timing:  Constant   Progression:  Unchanged Chronicity:  New Dislocation: no   Prior injury to area:  No Relieved by:  Nothing Worsened by:  Bearing weight and activity Ineffective treatments:  Acetaminophen Associated symptoms: decreased ROM, stiffness and swelling   Associated symptoms: no muscle weakness, no numbness and no tingling     Past Medical History  Diagnosis Date  . Osteoporosis   . Celiac disease   . Carotid artery occlusion     right carotid stenosis 40-60%, 4/08, neg for stenosis repeat study 11/11  . Depression   . Presbycusis   . Macular degeneration   . Urethral stricture   . Mild aortic insufficiency     a. Echo 10/2013: mild AI.  Marland Kitchen Moderate mitral regurgitation by prior echocardiogram     a. Echo 10/2013.  Marland Kitchen Thyroid nodule     29m, no change 11/11  . RLS (restless legs syndrome)   . Vitamin D deficiency   . Pulmonary hypertension ([facility]   . Hypertension   . PAF (paroxysmal  atrial fibrillation) ([facility]   . Coronary artery disease     a.  08/2013 (LIMA-LAD, rSVG to LCX and Lt atrial clip),  . Chronic diastolic CHF (congestive heart failure) (HCC)     takes Lasix daily  . MVP (mitral valve prolapse)   . Moderate obstructive sleep apnea     uses CPAP;sleep study about 4-543monthago  . GERD (gastroesophageal reflux disease)     takes Omeprazole daily  . Gastric ulcer     6-27m12monthgo  . GI bleeding   . Hemorrhoids   . Urinary retention   . Urinary anastomotic stricture   . Hypothyroidism     takes Synthroid daily as result of AMiodarone  . Hot flashes     takes ZOloft 3 times a week  . Insomnia   . Peripheral edema     left  . DVT (deep venous thrombosis) ([facility]/2014  . Atonic bladder 12/11/2013  . Chronic anticoagulation CHADS2VASC2 score 7 10/18/2014  . Anemia   . CKD (chronic kidney disease), stage III   . [facility]  Past Surgical History  Procedure Laterality Date  . Appendectomy    . Tubal ligation    .  Tonsillectomy    . Trigger thumb    . Cardiac catheterization  08-06-13  . Tcs    . Bilateral cataract surgery    . Mandible surgery    . Coronary artery bypass graft N/A 08/28/2013    Procedure: CORONARY ARTERY BYPASS GRAFTING (CABG) x2 using right greater saphenous vein and left internal mammary artery. ;  Surgeon: Grace Isaac, MD;  Location: [place];  Service: Open Heart Surgery;  Laterality: N/A;  . Intraoperative transesophageal echocardiogram N/A 08/28/2013    Procedure: INTRAOPERATIVE TRANSESOPHAGEAL ECHOCARDIOGRAM;  Surgeon: Grace Isaac, MD;  Location: [place];  Service: Open Heart Surgery;  Laterality: N/A;  . Hip arthroplasty Right 12/09/2013    Procedure: ARTHROPLASTY BIPOLAR HIP CEMENTED;  Surgeon: Kerin Salen, MD;  Location: [place];  Service: Orthopedics;  Laterality: Right;  . Left and right heart catheterization with coronary angiogram N/A 08/16/2013    Procedure: LEFT AND RIGHT HEART CATHETERIZATION  WITH CORONARY ANGIOGRAM;  Surgeon: [name], MD;  Location: [facility] C[facility] LAB;  Service: Cardiovascular;  Laterality: N/A;   Family History  Problem Relation Age of Onset  . Heart attack Mother   . CVA Mother   . CAD Father   . Colon cancer Father   . Heart attack Father   . Heart attack Brother   . Peripheral vascular disease Brother   . AAA (abdominal aortic aneurysm) Brother    Social History  Substance Use Topics  . Smoking status: Never Smoker   . Smokeless tobacco: Never Used  . Alcohol Use: No   OB History    No data available     Review of Systems  Musculoskeletal: Positive for stiffness.  All other systems reviewed and are negative.   Allergies  Amoxicillin; Codeine; Fosamax; Gluten meal; Hctz; and Tetanus toxoids  Home Medications   Prior to Admission medications   Medication Sig Start Date End Date Taking? Authorizing Provider  acetaminophen (TYLENOL) 325 MG tablet Take 650 mg by mouth every 6 (six) hours as needed.    Historical Provider, MD  amiodarone (PACERONE) 100 MG tablet Take 2 tablets (200 mg total) by mouth 2 (two) times daily. Patient taking differently: Take 100 mg by mouth daily.  08/14/15   Belva Crome, MD  amLODipine (NORVASC) 5 MG tablet Take 5 mg by mouth every Monday, Wednesday, and Friday. 04/24/15   Historical Provider, MD  Cholecalciferol 1000 UNITS tablet Take 1,000 Units by mouth daily.    Historical Provider, MD  ferrous sulfate 325 (65 FE) MG tablet Take 325 mg by mouth daily with breakfast.    Historical Provider, MD  furosemide (LASIX) 20 MG tablet Take 20 mg by mouth daily.    Historical Provider, MD  levothyroxine (SYNTHROID, LEVOTHROID) 50 MCG tablet Take 1 tablet (50 mcg total) by mouth daily before breakfast. 01/03/14   Lavon Paganini Angiulli, PA-C  MULTIPLE VITAMIN PO Take 1 tablet by mouth daily.    Historical Provider, MD  polyethylene glycol (MIRALAX / GLYCOLAX) packet Take 17 g by mouth daily.    Historical Provider, MD   Rivaroxaban (XARELTO) 15 MG TABS tablet TAKE ONE TABLET BY MOUTH ONCE DAILY WITH SUPPER 01/09/16   Belva Crome, MD  sertraline (ZOLOFT) 50 MG tablet Take 25 mg by mouth daily. 12/08/15   Historical Provider, MD  zolpidem (AMBIEN) 5 MG tablet Take 5 mg by mouth at bedtime.  11/07/14   Historical Provider, MD   Meds Ordered and Administered this Visit  Medications - No data to display  BP 116/55 mmHg  Pulse 65  Temp(Src) 98.2 F (36.8 C) (Oral)  Resp 16  Ht 5' 1.5" (1.562 m)  Wt 100 lb (45.36 kg)  BMI 18.59 kg/m2  SpO2 97% No data found.   Physical Exam  Constitutional: She is oriented to person, place, and time. She appears well-developed and well-nourished. No distress.  HENT:  Head: Atraumatic.  Eyes: Pupils are equal, round, and reactive to light.  Neck: Normal range of motion.  Cardiovascular: Normal heart sounds.   Pulmonary/Chest: Breath sounds normal.  Abdominal: There is no tenderness.  Musculoskeletal: She exhibits no edema.       Right hip: She exhibits decreased range of motion and tenderness. She exhibits normal strength, no bony tenderness, no swelling, no crepitus, no deformity and no laceration.       Right knee: She exhibits decreased range of motion. She exhibits no swelling, no effusion, no ecchymosis, no deformity, no laceration, no erythema and normal alignment. Tenderness found.  RIght knee is diffusely tender to palpation with decreased range of motion.  Unable to adequately examine because of patient's pain.  There is mild tenderness to palpation over right buttock and right greater trochanter, but no swelling or ecchymosis.  Right hip has relatively good internal/external rotation and flexion/extension.  Distal neurovascular function is intact.     Neurological: She is alert and oriented to person, place, and time.  Skin: Skin is warm and dry.  Nursing note and vitals reviewed.   ED Course  Procedures none   Imaging Review Dg Knee Complete 4 Views  Right  05/05/2016  CLINICAL DATA:  Fall today with right knee pain, initial encounter EXAM: RIGHT KNEE - COMPLETE 4+ VIEW COMPARISON:  None. FINDINGS: No acute fracture or dislocation is noted. No joint effusion is seen. Mild vascular calcifications are noted. IMPRESSION: No acute abnormality seen. Electronically Signed   By: Inez Catalina M.D.   On: 05/05/2016 16:41   Dg Hip Unilat With Pelvis 2-3 Views Right  05/05/2016  CLINICAL DATA:  Fall today with right hip and knee pain, initial encounter EXAM: DG HIP (WITH OR WITHOUT PELVIS) 2-3V RIGHT COMPARISON:  None. FINDINGS: Right hip prosthesis is noted. No acute fracture or dislocation is seen. Pelvic ring as visualized is intact. The proximal left femur is within normal limits. IMPRESSION: Status post right hip prosthesis without acute bony abnormality. Electronically Signed   By: Inez Catalina M.D.   On: 05/05/2016 16:40      MDM   1. Sprain of right knee, initial encounter    Dispensed hinged knee brace. Apply ice pack for 30 minutes every 1 to 2 hours today and tomorrow.  Elevate.  Use crutches for 3 to 5 days.  Wear brace for about 2 to 3 weeks.  Begin range of motion and stretching exercises in about 5 days as per instruction sheet.  May take Tylenol as needed for pain. Followup with Dr. Aundria Mems or Dr. Lynne Leader ([facility]) if not improving about two weeks.     Kandra Nicolas, MD 05/08/16 [phone]

## 2016-05-05 NOTE — ED Notes (Signed)
Pt c/o RT knee pain radiating to her RT calf and hip post fall at 1000 today. She took 2 tabs of Tylenol at 1000 and 1500.

## 2016-05-05 NOTE — Discharge Instructions (Signed)
Apply ice pack for 30 minutes every 1 to 2 hours today and tomorrow.  Elevate.  Use crutches for 3 to 5 days.  Wear brace for about 2 to 3 weeks.  Begin range of motion and stretching exercises in about 5 days as per instruction sheet.  May take Tylenol as needed for pain.   Knee Sprain A knee sprain is a tear in one of the strong, fibrous tissues that connect the bones (ligaments) in your knee. The severity of the sprain depends on how much of the ligament is torn. The tear can be either partial or complete. CAUSES  Often, sprains are a result of a fall or injury. The force of the impact causes the fibers of your ligament to stretch too much. This excess tension causes the fibers of your ligament to tear. SIGNS AND SYMPTOMS  You may have some loss of motion in your knee. Other symptoms include:  Bruising.  Pain in the knee area.  Tenderness of the knee to the touch.  Swelling. DIAGNOSIS  To diagnose a knee sprain, your health care provider will physically examine your knee. Your health care provider may also suggest an X-ray exam of your knee to make sure no bones are broken. TREATMENT  If your ligament is only partially torn, treatment usually involves keeping the knee in a fixed position (immobilization) or bracing your knee for activities that require movement for several weeks. To do this, your health care provider will apply a bandage, cast, or splint to keep your knee from moving and to support your knee during movement until it heals. For a partially torn ligament, the healing process usually takes 4-6 weeks. If your ligament is completely torn, depending on which ligament it is, you may need surgery to reconnect the ligament to the bone or reconstruct it. After surgery, a cast or splint may be applied and will need to stay on your knee for 4-6 weeks while your ligament heals. HOME CARE INSTRUCTIONS  Keep your injured knee elevated to decrease swelling.  To ease pain and swelling,  apply ice to the injured area:  Put ice in a plastic bag.  Place a towel between your skin and the bag.  Leave the ice on for 20 minutes, 2-3 times a day.  Only take medicine for pain as directed by your health care provider.  Do not leave your knee unprotected until pain and stiffness go away (usually 4-6 weeks).  If you have a cast or splint, do not allow it to get wet. If you have been instructed not to remove it, cover it with a plastic bag when you shower or bathe. Do not swim.  Your health care provider may suggest exercises for you to do during your recovery to prevent or limit permanent weakness and stiffness. SEEK IMMEDIATE MEDICAL CARE IF:  Your cast or splint becomes damaged.  Your pain becomes worse.  You have significant pain, swelling, or numbness below the cast or splint. MAKE SURE YOU:  Understand these instructions.  Will watch your condition.  Will get help right away if you are not doing well or get worse.   This information is not intended to replace advice given to you by your health care provider. Make sure you discuss any questions you have with your health care provider.   Document Released: 10/11/2005 Document Revised: 11/01/2014 Document Reviewed: 05/23/2013 Elsevier Interactive Patient Education [employer].

## 2016-05-08 ENCOUNTER — Telehealth: Payer: Self-pay | Admitting: Emergency Medicine

## 2016-05-08 NOTE — Telephone Encounter (Signed)
Patient calling for advice on using walker instead of crutches. She is improving. Told her either way of keeping pressure off knee was fine.

## 2016-05-18 DIAGNOSIS — M25561 Pain in right knee: Secondary | ICD-10-CM | POA: Diagnosis not present

## 2016-05-18 DIAGNOSIS — K5901 Slow transit constipation: Secondary | ICD-10-CM | POA: Diagnosis not present

## 2016-05-18 DIAGNOSIS — R300 Dysuria: Secondary | ICD-10-CM | POA: Diagnosis not present

## 2016-05-18 DIAGNOSIS — I10 Essential (primary) hypertension: Secondary | ICD-10-CM | POA: Diagnosis not present

## 2016-05-18 DIAGNOSIS — F5101 Primary insomnia: Secondary | ICD-10-CM | POA: Diagnosis not present

## 2016-05-18 DIAGNOSIS — Z79899 Other long term (current) drug therapy: Secondary | ICD-10-CM | POA: Diagnosis not present

## 2016-05-18 DIAGNOSIS — D509 Iron deficiency anemia, unspecified: Secondary | ICD-10-CM | POA: Diagnosis not present

## 2016-05-21 ENCOUNTER — Other Ambulatory Visit: Payer: Self-pay | Admitting: Interventional Cardiology

## 2016-05-21 MED ORDER — RIVAROXABAN 15 MG PO TABS: ORAL_TABLET | ORAL | 2 refills | Status: DC

## 2016-06-16 ENCOUNTER — Encounter (HOSPITAL_COMMUNITY): Payer: Self-pay | Admitting: Emergency Medicine

## 2016-06-16 ENCOUNTER — Emergency Department (HOSPITAL_COMMUNITY): Payer: Medicare Other

## 2016-06-16 ENCOUNTER — Ambulatory Visit: Payer: Medicare Other | Admitting: Neurology

## 2016-06-16 ENCOUNTER — Emergency Department (HOSPITAL_COMMUNITY)
Admission: EM | Admit: 2016-06-16 | Discharge: 2016-06-16 | Disposition: A | Discharge status: Home / Self Care | Payer: Medicare Other | Attending: Emergency Medicine | Admitting: Emergency Medicine

## 2016-06-16 DIAGNOSIS — I5032 Chronic diastolic (congestive) heart failure: Secondary | ICD-10-CM | POA: Diagnosis not present

## 2016-06-16 DIAGNOSIS — N183 Chronic kidney disease, stage 3 (moderate): Secondary | ICD-10-CM | POA: Diagnosis not present

## 2016-06-16 DIAGNOSIS — Z96641 Presence of right artificial hip joint: Secondary | ICD-10-CM | POA: Insufficient documentation

## 2016-06-16 DIAGNOSIS — R42 Dizziness and giddiness: Secondary | ICD-10-CM | POA: Insufficient documentation

## 2016-06-16 DIAGNOSIS — Z951 Presence of aortocoronary bypass graft: Secondary | ICD-10-CM | POA: Diagnosis not present

## 2016-06-16 DIAGNOSIS — I130 Hypertensive heart and chronic kidney disease with heart failure and stage 1 through stage 4 chronic kidney disease, or unspecified chronic kidney disease: Secondary | ICD-10-CM | POA: Insufficient documentation

## 2016-06-16 DIAGNOSIS — Z7901 Long term (current) use of anticoagulants: Secondary | ICD-10-CM | POA: Diagnosis not present

## 2016-06-16 DIAGNOSIS — R404 Transient alteration of awareness: Secondary | ICD-10-CM | POA: Diagnosis not present

## 2016-06-16 DIAGNOSIS — I2510 Atherosclerotic heart disease of native coronary artery without angina pectoris: Secondary | ICD-10-CM | POA: Diagnosis not present

## 2016-06-16 DIAGNOSIS — E039 Hypothyroidism, unspecified: Secondary | ICD-10-CM | POA: Insufficient documentation

## 2016-06-16 LAB — CBC WITH DIFFERENTIAL/PLATELET
Basophils Absolute: 0 10*3/uL (ref 0.0–0.1)
Basophils Relative: 0 %
EOS ABS: 0 10*3/uL (ref 0.0–0.7)
Eosinophils Relative: 0 %
HEMATOCRIT: 39.2 % (ref 36.0–46.0)
HEMOGLOBIN: 13.2 g/dL (ref 12.0–15.0)
LYMPHS ABS: 0.9 10*3/uL (ref 0.7–4.0)
Lymphocytes Relative: 14 %
MCH: 31.3 pg (ref 26.0–34.0)
MCHC: 33.7 g/dL (ref 30.0–36.0)
MCV: 92.9 fL (ref 78.0–100.0)
MONOS PCT: 8 %
Monocytes Absolute: 0.5 10*3/uL (ref 0.1–1.0)
NEUTROS ABS: 5.2 10*3/uL (ref 1.7–7.7)
NEUTROS PCT: 78 %
Platelets: 289 10*3/uL (ref 150–400)
RBC: 4.22 MIL/uL (ref 3.87–5.11)
RDW: 14.9 % (ref 11.5–15.5)
WBC: 6.7 10*3/uL (ref 4.0–10.5)

## 2016-06-16 LAB — BASIC METABOLIC PANEL
Anion gap: 9 (ref 5–15)
BUN: 8 mg/dL (ref 6–20)
CHLORIDE: 100 mmol/L — AB (ref 101–111)
CO2: 23 mmol/L (ref 22–32)
Calcium: 9.6 mg/dL (ref 8.9–10.3)
Creatinine, Ser: 0.67 mg/dL (ref 0.44–1.00)
GFR calc non Af Amer: >60 mL/min (ref >60)
Glucose, Bld: 103 mg/dL — ABNORMAL HIGH (ref 65–99)
POTASSIUM: 4.4 mmol/L (ref 3.5–5.1)
SODIUM: 132 mmol/L — AB (ref 135–145)

## 2016-06-16 MED ORDER — MECLIZINE HCL 12.5 MG PO TABS: 12.5000 mg | ORAL_TABLET | Freq: Three times a day (TID) | ORAL | 0 refills | Status: DC | PRN

## 2016-06-16 MED ORDER — FLUTICASONE PROPIONATE 50 MCG/ACT NA SUSP
NASAL | 1 refills | Status: DC
Start: 2016-06-16 — End: 2017-07-01

## 2016-06-16 MED ORDER — MECLIZINE HCL 25 MG PO TABS
12.5000 mg | ORAL_TABLET | Freq: Once | ORAL | Status: AC
  Administered 2016-06-16: 12.5 mg via ORAL
  Filled 2016-06-16: qty 1

## 2016-06-16 NOTE — ED Triage Notes (Signed)
Pt arrives via [facility] from home for c/o dizziness and blurred vision that patient reports she has had on and off for 3 years but that has gotten worse over the past week. Ems reports pt had another episode of dizziness and blurred vision this morning while home health was with her. Ems reports pts symptom had resolved upon their arrival, patient is a/ox4, resp e/u, no facial droop.

## 2016-06-16 NOTE — ED Provider Notes (Signed)
Maria [name] Provider Note   CSN: [id number] Arrival date & time: 06/16/16  1347     History   Chief Complaint Chief Complaint  Patient presents with  . Dizziness  . Blurred Vision    HPI Maria [name] is a 82 y.o. female. She states that she's had intermittent episodes of dizziness described as feeling like things are moving over the last month. Has some pressure in her right ear. Has noticed that her hearing is decreased in her right ear as well. She states she keeps "checking my hearing 8". No tinnitus. No ear pain. Some nasal congestion. Had an episode today that was a little worse than others. She became concerned. She "pulled her cord", the home health care nurse came.  She has mild intermittent symptoms. No headache. Mild nausea today which is resolved. No difficulty with use of the arms or legs or speech.  No history of strokes. History of A. fib on chronic anticoagulation  HPI  Past Medical History:  Diagnosis Date  . Anemia   . Atonic bladder 12/11/2013  . Atrial fibrillation ([place])   . Carotid artery occlusion    right carotid stenosis 40-60%, 4/08, neg for stenosis repeat study 11/11  . Celiac disease   . Chronic anticoagulation CHADS2VASC2 score 7 10/18/2014  . Chronic diastolic CHF (congestive heart failure) (HCC)    takes Lasix daily  . CKD (chronic kidney disease), stage III   . Coronary artery disease    a.  08/2013 (LIMA-LAD, rSVG to LCX and Lt atrial clip),  . Depression   . DVT (deep venous thrombosis) ([place]) 09/2013  . Gastric ulcer    6-95month ago  . GERD (gastroesophageal reflux disease)    takes Omeprazole daily  . GI bleeding   . Hemorrhoids   . Hot flashes    takes ZOloft 3 times a week  . Hypertension   . Hypothyroidism    takes Synthroid daily as result of AMiodarone  . Insomnia   . Macular degeneration   . Mild aortic insufficiency    a. Echo 10/2013: mild AI.  .Marland KitchenModerate mitral regurgitation by prior echocardiogram    a.  Echo 10/2013.  . Moderate obstructive sleep apnea    uses CPAP;sleep study about 4-529monthago  . MVP (mitral valve prolapse)   . Osteoporosis   . PAF (paroxysmal atrial fibrillation) (HCEwing  . Peripheral edema    left  . Presbycusis   . Pulmonary hypertension (HCMacon  . RLS (restless legs syndrome)   . Thyroid nodule    28m39mno change 11/11  . Urethral stricture   . Urinary anastomotic stricture   . Urinary retention   . Vitamin D deficiency     Patient Active Problem List   Diagnosis Date Noted  . CKD (chronic kidney disease), stage III   . Hypothyroidism 10/18/2014  . Chronic anticoagulation CHADS2VASC2 score 7 10/18/2014  . Right leg pain 07/22/2014  . Gout of foot 04/10/2014  . Atonic bladder 12/11/2013  . Closed right hip fracture (HCC[place]ver[place]2/13/2015  . Protein-calorie malnutrition, severe (HCCCorn[place]2/08/2013  . DVT (deep venous thrombosis) (HCCGra[place]2/07/2013  . Hx of CABG 09/12/2013  . Mitral regurgitation 08/07/2013  . Pulmonary hypertension, moderate to severe (HCCTaylor0/14/2014  . Essential hypertension, benign 08/02/2013  . Nontoxic uninodular goiter 08/02/2013  . Unspecified hereditary and idiopathic peripheral neuropathy 08/02/2013  . Carotid disease, bilateral (HCCMoyie Springs0/06/2013  . Unspecified vitamin D deficiency 08/02/2013  . Depressive disorder, not elsewhere  classified 08/02/2013  . Paroxysmal atrial fibrillation ([place]) 08/02/2013  . Trifascicular block 08/02/2013  . Gastric ulcer, unspecified as acute or chronic, without mention of hemorrhage, perforation, or obstruction 08/02/2013  . Sinoatrial node dysfunction ([place]) 08/02/2013    Past Surgical History:  Procedure Laterality Date  . APPENDECTOMY    . bilateral cataract surgery    . CARDIAC CATHETERIZATION  08-06-13  . CORONARY ARTERY BYPASS GRAFT N/A 08/28/2013   Procedure: CORONARY ARTERY BYPASS GRAFTING (CABG) x2 using right greater saphenous vein and left internal mammary artery. ;  Surgeon: Grace Isaac, MD;  Location: [place];  Service: Open Heart Surgery;  Laterality: N/A;  . HIP ARTHROPLASTY Right 12/09/2013   Procedure: ARTHROPLASTY BIPOLAR HIP CEMENTED;  Surgeon: Kerin Salen, MD;  Location: [facility];  Service: Orthopedics;  Laterality: Right;  . INTRAOPERATIVE TRANSESOPHAGEAL ECHOCARDIOGRAM N/A 08/28/2013   Procedure: INTRAOPERATIVE TRANSESOPHAGEAL ECHOCARDIOGRAM;  Surgeon: Grace Isaac, MD;  Location: [facility];  Service: Open Heart Surgery;  Laterality: N/A;  . LEFT AND RIGHT HEART CATHETERIZATION WITH CORONARY ANGIOGRAM N/A 08/16/2013   Procedure: LEFT AND RIGHT HEART CATHETERIZATION WITH CORONARY ANGIOGRAM;  Surgeon: [name], MD;  Location[facility];  Service: Cardiovascular;  Laterality: N/A;  . MANDIBLE SURGERY    . TCS    . TONSILLECTOMY    . trigger thumb    . TUBAL LIGATION      OB History    No data available       Home Medications    Prior to Admission medications   Medication Sig Start Date End Date Taking? Authorizing Provider  acetaminophen (TYLENOL) 325 MG tablet Take 650 mg by mouth every 6 (six) hours as needed.    Historical Provider, MD  amiodarone (PACERONE) 100 MG tablet Take 2 tablets (200 mg total) by mouth 2 (two) times daily. Patient taking differently: Take 100 mg by mouth daily.  08/14/15   Belva Crome, MD  amLODipine (NORVASC) 5 MG tablet Take 5 mg by mouth every Monday, Wednesday, and Friday. 04/24/15   Historical Provider, MD  Cholecalciferol 1000 UNITS tablet Take 1,000 Units by mouth daily.    Historical Provider, MD  ferrous sulfate 325 (65 FE) MG tablet Take 325 mg by mouth daily with breakfast.    Historical Provider, MD  fluticasone (FLONASE) 50 MCG/ACT nasal spray 1 spray each nares twice a day 06/16/16   Tanna Furry, MD  furosemide (LASIX) 20 MG tablet Take 20 mg by mouth daily.    Historical Provider, MD  levothyroxine (SYNTHROID, LEVOTHROID) 50 MCG tablet Take 1 tablet (50 mcg total) by mouth daily before breakfast.  01/03/14   Lavon Paganini Angiulli, PA-C  meclizine (ANTIVERT) 12.5 MG tablet Take 1 tablet (12.5 mg total) by mouth 3 (three) times daily as needed for dizziness. 06/16/16   Tanna Furry, MD  MULTIPLE VITAMIN PO Take 1 tablet by mouth daily.    Historical Provider, MD  polyethylene glycol (MIRALAX / GLYCOLAX) packet Take 17 g by mouth daily.    Historical Provider, MD  Rivaroxaban (XARELTO) 15 MG TABS tablet TAKE ONE TABLET BY MOUTH ONCE DAILY WITH SUPPER 05/21/16   Belva Crome, MD  sertraline (ZOLOFT) 50 MG tablet Take 25 mg by mouth daily. 12/08/15   Historical Provider, MD  zolpidem (AMBIEN) 5 MG tablet Take 5 mg by mouth at bedtime.  11/07/14   Historical Provider, MD    Family History Family History  Problem Relation Age of Onset  .  Heart attack Mother   . CVA Mother   . CAD Father   . Colon cancer Father   . Heart attack Father   . Heart attack Brother   . Peripheral vascular disease Brother   . AAA (abdominal aortic aneurysm) Brother     Social History Social History  Substance Use Topics  . Smoking status: Never Smoker  . Smokeless tobacco: Never Used  . Alcohol use No     Allergies   Amoxicillin; Codeine; Fosamax [alendronate sodium]; Gluten meal; Hctz [hydrochlorothiazide]; and Tetanus toxoids   Review of Systems Review of Systems  Constitutional: Negative for appetite change, chills, diaphoresis, fatigue and fever.  HENT: Negative for mouth sores, sore throat and trouble swallowing.   Eyes: Negative for visual disturbance.  Respiratory: Negative for cough, chest tightness, shortness of breath and wheezing.   Cardiovascular: Negative for chest pain.  Gastrointestinal: Positive for nausea. Negative for abdominal distention, abdominal pain, diarrhea and vomiting.  Endocrine: Negative for polydipsia, polyphagia and polyuria.  Genitourinary: Negative for dysuria, frequency and hematuria.  Musculoskeletal: Negative for gait problem.  Skin: Negative for color change, pallor  and rash.  Neurological: Positive for dizziness. Negative for syncope, light-headedness and headaches.  Hematological: Does not bruise/bleed easily.  Psychiatric/Behavioral: Negative for behavioral problems and confusion.     Physical Exam Updated Vital Signs BP (!) 147/54   Pulse 63   Temp 98 F (36.7 C)   Resp 18   Ht 5' 1.5" (1.562 m)   Wt 101 lb (45.8 kg)   SpO2 98%   BMI 18.77 kg/m   Physical Exam  Constitutional: She is oriented to person, place, and time. She appears well-developed and well-nourished. No distress.  HENT:  Head: Normocephalic.  Ears:  Eyes: Conjunctivae are normal. Pupils are equal, round, and reactive to light. No scleral icterus.  Neck: Normal range of motion. Neck supple. No thyromegaly present.  Cardiovascular: Normal rate and regular rhythm.  Exam reveals no gallop and no friction rub.   No murmur heard. Pulmonary/Chest: Effort normal and breath sounds normal. No respiratory distress. She has no wheezes. She has no rales.  Abdominal: Soft. Bowel sounds are normal. She exhibits no distension. There is no tenderness. There is no rebound.  Musculoskeletal: Normal range of motion.  Neurological: She is alert and oriented to person, place, and time.  Minimal nystagmus to lateral gaze. No other focal neurological deficits. Normal use of the extremity. Normal mentation.  Skin: Skin is warm and dry. No rash noted.  Psychiatric: She has a normal mood and affect. Her behavior is normal.     ED Treatments / Results  Labs (all labs ordered are listed, but only abnormal results are displayed) Labs Reviewed  BASIC METABOLIC PANEL - Abnormal; Notable for the following:       Result Value   Sodium 132 (*)    Chloride 100 (*)    Glucose, Bld 103 (*)    All other components within normal limits  CBC WITH DIFFERENTIAL/PLATELET    EKG  EKG Interpretation None       Radiology Ct Head Wo Contrast  Result Date: 06/16/2016 CLINICAL DATA:  Dizziness  and pain behind the right year. EXAM: CT HEAD WITHOUT CONTRAST CT MAXILLOFACIAL WITHOUT CONTRAST TECHNIQUE: Multidetector CT imaging of the head and maxillofacial structures were performed using the standard protocol without intravenous contrast. Multiplanar CT image reconstructions of the maxillofacial structures were also generated. COMPARISON:  Head CT 03/02/2016. FINDINGS: CT HEAD FINDINGS There is no  evidence for acute hemorrhage, hydrocephalus, mass lesion, or abnormal extra-axial fluid collection. No definite CT evidence for acute infarction. Diffuse loss of parenchymal volume is consistent with atrophy. Patchy low attenuation in the deep hemispheric and periventricular white matter is nonspecific, but likely reflects chronic microvascular ischemic demyelination. Atherosclerotic calcification is visualized in the carotid arteries. No dense MCA sign. Major dural sinuses are unremarkable. No evidence for skull fracture. Polypoid mucosal thickening is seen in the left maxillary sinus. Remaining visualized paranasal sinuses are clear. Visualized portions of the globes and intraorbital fat are unremarkable. CT MAXILLOFACIAL FINDINGS No evidence for medial or inferior orbital wall blowout fracture. Nasal bones are intact. No zygomatic arch fracture. Temporomandibular joints are located. Mandible is intact. Chronic mucosal thickening is identified in both maxillary sinuses. IMPRESSION: 1. No acute intracranial abnormality. 2. Atrophy with chronic small vessel white matter ischemic disease. 3. No acute bony abnormality in the face. Electronically Signed   By: Misty Stanley M.D.   On: 06/16/2016 15:40   Ct Maxillofacial Wo Contrast  Result Date: 06/16/2016 CLINICAL DATA:  Dizziness and pain behind the right year. EXAM: CT HEAD WITHOUT CONTRAST CT MAXILLOFACIAL WITHOUT CONTRAST TECHNIQUE: Multidetector CT imaging of the head and maxillofacial structures were performed using the standard protocol without intravenous  contrast. Multiplanar CT image reconstructions of the maxillofacial structures were also generated. COMPARISON:  Head CT 03/02/2016. FINDINGS: CT HEAD FINDINGS There is no evidence for acute hemorrhage, hydrocephalus, mass lesion, or abnormal extra-axial fluid collection. No definite CT evidence for acute infarction. Diffuse loss of parenchymal volume is consistent with atrophy. Patchy low attenuation in the deep hemispheric and periventricular white matter is nonspecific, but likely reflects chronic microvascular ischemic demyelination. Atherosclerotic calcification is visualized in the carotid arteries. No dense MCA sign. Major dural sinuses are unremarkable. No evidence for skull fracture. Polypoid mucosal thickening is seen in the left maxillary sinus. Remaining visualized paranasal sinuses are clear. Visualized portions of the globes and intraorbital fat are unremarkable. CT MAXILLOFACIAL FINDINGS No evidence for medial or inferior orbital wall blowout fracture. Nasal bones are intact. No zygomatic arch fracture. Temporomandibular joints are located. Mandible is intact. Chronic mucosal thickening is identified in both maxillary sinuses. IMPRESSION: 1. No acute intracranial abnormality. 2. Atrophy with chronic small vessel white matter ischemic disease. 3. No acute bony abnormality in the face. Electronically Signed   By: Misty Stanley M.D.   On: 06/16/2016 15:40    Procedures Procedures (including critical care time)  Medications Ordered in ED Medications  meclizine (ANTIVERT) tablet 12.5 mg (12.5 mg Oral Given 06/16/16 1525)     Initial Impression / Assessment and Plan / ED Course  I have reviewed the triage vital signs and the nursing notes.  Pertinent labs & imaging results that were available during my care of the patient were reviewed by me and considered in my medical decision making (see chart for details).  Clinical Course    CT without hemorrhage or acute findings. Doubt CVA his  symptoms are intermittent. She is anticoagulated. No hemorrhage. Given 12.5 Antivert and her symptoms improved. Plan is home. She does have a walker at home that she uses intermittent leg. Advised her to use this until at least 24 hours without symptoms. Low-dose meclizine that she does have history of atonic bladder. Primary care follow-up if not improving. Flonase for nasal congestion and ear effusion.  Final Clinical Impressions(s) / ED Diagnoses   Final diagnoses:  Vertigo    New Prescriptions New Prescriptions   FLUTICASONE (  FLONASE) 50 MCG/ACT NASAL SPRAY    1 spray each nares twice a day   MECLIZINE (ANTIVERT) 12.5 MG TABLET    Take 1 tablet (12.5 mg total) by mouth 3 (three) times daily as needed for dizziness.     Tanna Furry, MD 06/16/16 [phone]

## 2016-06-16 NOTE — ED Notes (Signed)
Ambulated pt in hall pt ambulated well pt complains she felt dizzy while walking no other complaints noted at this time

## 2016-07-21 DIAGNOSIS — Z23 Encounter for immunization: Secondary | ICD-10-CM | POA: Diagnosis not present

## 2016-07-21 DIAGNOSIS — I480 Paroxysmal atrial fibrillation: Secondary | ICD-10-CM | POA: Diagnosis not present

## 2016-07-21 DIAGNOSIS — Z79899 Other long term (current) drug therapy: Secondary | ICD-10-CM | POA: Diagnosis not present

## 2016-07-21 DIAGNOSIS — I700 Atherosclerosis of aorta: Secondary | ICD-10-CM | POA: Diagnosis not present

## 2016-07-21 DIAGNOSIS — K1370 Unspecified lesions of oral mucosa: Secondary | ICD-10-CM | POA: Diagnosis not present

## 2016-07-21 DIAGNOSIS — D509 Iron deficiency anemia, unspecified: Secondary | ICD-10-CM | POA: Diagnosis not present

## 2016-07-21 DIAGNOSIS — R42 Dizziness and giddiness: Secondary | ICD-10-CM | POA: Diagnosis not present

## 2016-07-21 DIAGNOSIS — R5383 Other fatigue: Secondary | ICD-10-CM | POA: Diagnosis not present

## 2016-07-21 DIAGNOSIS — I10 Essential (primary) hypertension: Secondary | ICD-10-CM | POA: Diagnosis not present

## 2016-07-30 ENCOUNTER — Other Ambulatory Visit (INDEPENDENT_AMBULATORY_CARE_PROVIDER_SITE_OTHER): Payer: Medicare Other

## 2016-07-30 ENCOUNTER — Encounter: Payer: Self-pay | Admitting: Neurology

## 2016-07-30 ENCOUNTER — Ambulatory Visit (INDEPENDENT_AMBULATORY_CARE_PROVIDER_SITE_OTHER): Payer: Medicare Other | Admitting: Neurology

## 2016-07-30 VITALS — BP 154/60 | HR 68 | Ht 61.0 in | Wt 103.9 lb

## 2016-07-30 DIAGNOSIS — I4891 Unspecified atrial fibrillation: Secondary | ICD-10-CM | POA: Diagnosis not present

## 2016-07-30 DIAGNOSIS — R42 Dizziness and giddiness: Secondary | ICD-10-CM

## 2016-07-30 DIAGNOSIS — I679 Cerebrovascular disease, unspecified: Secondary | ICD-10-CM

## 2016-07-30 DIAGNOSIS — R296 Repeated falls: Secondary | ICD-10-CM

## 2016-07-30 DIAGNOSIS — G2581 Restless legs syndrome: Secondary | ICD-10-CM | POA: Diagnosis not present

## 2016-07-30 DIAGNOSIS — R2681 Unsteadiness on feet: Secondary | ICD-10-CM | POA: Diagnosis not present

## 2016-07-30 LAB — FERRITIN: Ferritin: 19.8 ng/mL (ref 10.0–291.0)

## 2016-07-30 NOTE — Progress Notes (Signed)
NEUROLOGY CONSULTATION NOTE  Maria [name] MRN: [id number] DOB: 18-Aug-1924  Referring provider: Dr. Felipa Eth Primary care provider: Dr. Felipa Eth  Reason for consult:  Dizziness, falls  HISTORY OF PRESENT ILLNESS: Maria [name] is a 82 year old woman with paroxysmal atrial fibrillation, macular degeneration, depression, HTN, and history of MI who presents for dizziness.  History obtained by patient, her daughter (who accompanies her) and PCP note.  She has had several falls over the past 3 years.  Sometimes they are associated with dizziness, but not always.  Her first fall occurred after open-heart surgery, when she fell and hit her head after turning and stepping off the rug onto the tile floor.  She fractured her hip.  Another time, it occurred when she leaned forward to flush the toilet.  She has described episodes of dizziness.  Sometimes it is a spinning sensation lasting a couple of minutes with change in position, such as turning her head or laying down in bed.  She denies neck pain or weakness in the legs.  She doesn't always feel unsteady when walking but when she falls, it tends to be towards the right.  She did not respond to vestibular rehab.  She does not use a walking assisted device.   She has had several brain imaging.  MRI of brain with and without contrast from 01/16/16 was personally reviewed and revealed chronic small vessel ischemic changes in the cerebral white matter and cerebellum, but no acute changes.  CT of head from 03/02/16 and 06/16/16 were personally reviewed and were negative for acute changes.  She has atrial fibrillation and is on Xarelto.    Of note, she also has restless leg.  CBC from August showed WBC 6.7, HGB 13.2, HCT 39.2 and PLT 289; BMP showed Na 132, K 4.4, Cl 100, CO2 23, glucose 103, BUN 8 and Cr 0.67.  Carotid doppler from 2011 showed no hemodynamically significant stenosis in the ICAs and antegrade flow in the vertebral arteries.  PAST  MEDICAL HISTORY: Past Medical History:  Diagnosis Date  . Anemia   . Atonic bladder 12/11/2013  . Atrial fibrillation ([place])   . Carotid artery occlusion    right carotid stenosis 40-60%, 4/08, neg for stenosis repeat study 11/11  . Celiac disease   . Chronic anticoagulation CHADS2VASC2 score 7 10/18/2014  . Chronic diastolic CHF (congestive heart failure) (HCC)    takes Lasix daily  . CKD (chronic kidney disease), stage III   . Coronary artery disease    a.  08/2013 (LIMA-LAD, rSVG to LCX and Lt atrial clip),  . Depression   . DVT (deep venous thrombosis) ([place]) 09/2013  . Gastric ulcer    6-71month ago  . GERD (gastroesophageal reflux disease)    takes Omeprazole daily  . GI bleeding   . Hemorrhoids   . Hot flashes    takes ZOloft 3 times a week  . Hypertension   . Hypothyroidism    takes Synthroid daily as result of AMiodarone  . Insomnia   . Macular degeneration   . Mild aortic insufficiency    a. Echo 10/2013: mild AI.  .Marland KitchenModerate mitral regurgitation by prior echocardiogram    a. Echo 10/2013.  . Moderate obstructive sleep apnea    uses CPAP;sleep study about 4-525monthago  . MVP (mitral valve prolapse)   . Osteoporosis   . PA[place] ([place]ox[place] fibrillation) [place]  . Peripheral edema    left  . Presbycusis   . Pulmonary hypertension   .  RLS (restless legs syndrome)   . Thyroid nodule    5m, no change 11/11  . Urethral stricture   . Urinary anastomotic stricture   . Urinary retention   . Vitamin D deficiency     PAST SURGICAL HISTORY: Past Surgical History:  Procedure Laterality Date  . APPENDECTOMY    . bilateral cataract surgery    . CARDIAC CATHETERIZATION  08-06-13  . CORONARY ARTERY BYPASS GRAFT N/A 08/28/2013   Procedure: CORONARY ARTERY BYPASS GRAFTING (CABG) x2 using right greater saphenous vein and left internal mammary artery. ;  Surgeon: EGrace Isaac MD;  [place] [facility]  Service: Open Heart Surgery;  Laterality: N/A;  . HIP  ARTHROPLASTY Right 12/09/2013   Procedure: ARTHROPLASTY BIPOLAR HIP CEMENTED;  Surgeon: FKerin Salen MD;  [place] [facility]  Service: Orthopedics;  Laterality: Right;  . INTRAOPERATIVE TRANSESOPHAGEAL ECHOCARDIOGRAM N/A 08/28/2013   Procedure: INTRAOPERATIVE TRANSESOPHAGEAL ECHOCARDIOGRAM;  Surgeon: EGrace Isaac MD;  [place] [facility]  Service: Open Heart Surgery;  Laterality: N/A;  . LEFT AND RIGHT HEART CATHETERIZATION WITH CORONARY ANGIOGRAM N/A 08/16/2013   Procedure: LEFT AND RIGHT HEART CATHETERIZATION WITH CORONARY ANGIOGRAM;  Surgeon: HSinclair Grooms MD;  [facility];  Service: Cardiovascular;  Laterality: N/A;  . MANDIBLE SURGERY    . TCS    . TONSILLECTOMY    . trigger thumb    . TUBAL LIGATION      MEDICATIONS: Current Outpatient Prescriptions on File Prior to Visit  Medication Sig Dispense Refill  . amiodarone (PACERONE) 100 MG tablet Take 2 tablets (200 mg total) by mouth 2 (two) times daily. (Patient taking differently: Take 100 mg by mouth daily. )    . amLODipine (NORVASC) 5 MG tablet Take 5 mg by mouth every Monday, Wednesday, and Friday.    . Cholecalciferol 1000 UNITS tablet Take 1,000 Units by mouth daily.    . fluticasone (FLONASE) 50 MCG/ACT nasal spray 1 spray each nares twice a day 10 g 1  . furosemide (LASIX) 20 MG tablet Take 20 mg by mouth daily.    .Marland Kitchenlevothyroxine (SYNTHROID, LEVOTHROID) 50 MCG tablet Take 1 tablet (50 mcg total) by mouth daily before breakfast. 30 tablet 11  . MULTIPLE VITAMIN PO Take 1 tablet by mouth daily.    . polyethylene glycol (MIRALAX / GLYCOLAX) packet Take 17 g by mouth daily.    . Rivaroxaban (XARELTO) 15 MG TABS tablet TAKE ONE TABLET BY MOUTH ONCE DAILY WITH SUPPER 90 tablet 2  . sertraline (ZOLOFT) 50 MG tablet Take 25 mg by mouth daily.    .Marland Kitchenacetaminophen (TYLENOL) 325 MG tablet Take 650 mg by mouth every 6 (six) hours as needed.    . ferrous sulfate 325 (65 FE) MG tablet Take 325 mg by mouth daily with  breakfast.    . meclizine (ANTIVERT) 12.5 MG tablet Take 1 tablet (12.5 mg total) by mouth 3 (three) times daily as needed for dizziness. (Patient not taking: Reported on 07/30/2016) 30 tablet 0   No current facility-administered medications on file prior to visit.     ALLERGIES: Allergies  Allergen Reactions  . Amoxicillin Other (See Comments)    Nausea, dizziness  . Codeine Nausea And Vomiting  . Fosamax [Alendronate Sodium] Other (See Comments)    Jaw pain  . Gluten Meal Other (See Comments)    Celiac Disease  . Hctz [Hydrochlorothiazide] Other (See Comments)    Hyponatremia, GI upset  . Tetanus Toxoids Other (See Comments)  Bad local reaction    FAMILY HISTORY: Family History  Problem Relation Age of Onset  . Heart attack Mother   . CVA Mother   . CAD Father   . Colon cancer Father   . Heart attack Father   . Heart attack Brother   . Peripheral vascular disease Brother   . AAA (abdominal aortic aneurysm) Brother     SOCIAL HISTORY: Social History   Social History  . Marital status: Widowed    Spouse name: N/A  . Number of children: N/A  . Years of education: N/A   Occupational History  . Not on file.   Social History Main Topics  . Smoking status: Never Smoker  . Smokeless tobacco: Never Used  . Alcohol use No  . Drug use: No  . Sexual activity: Yes    Birth control/ protection: Surgical   Other Topics Concern  . Not on file   Social History Narrative  . No narrative on file    REVIEW OF SYSTEMS: Constitutional: No fevers, chills, or sweats, no generalized fatigue, change in appetite Eyes: No visual changes, double vision, eye pain Ear, nose and throat: No hearing loss, ear pain, nasal congestion, sore throat Cardiovascular: No chest pain, palpitations Respiratory:  No shortness of breath at rest or with exertion, wheezes GastrointestinaI: constipation, diarreha Genitourinary:  No dysuria, urinary retention or frequency Musculoskeletal:  No  neck pain, back pain Integumentary: No rash, pruritus, skin lesions Neurological: as above Psychiatric: No depression, insomnia, anxiety Endocrine: No palpitations, fatigue, diaphoresis, mood swings, change in appetite, change in weight, increased thirst Hematologic/Lymphatic:  No purpura, petechiae. Allergic/Immunologic: no itchy/runny eyes, nasal congestion, recent allergic reactions, rashes  PHYSICAL EXAM: Vitals:   07/30/16 0951  BP: (!) 154/60  Pulse: 68   General: No acute distress.  Patient appears well-groomed. Head:  Normocephalic/atraumatic Eyes:  fundi examined but not visualized Neck: supple, no paraspinal tenderness, full range of motion Back: No paraspinal tenderness Heart: regular rate and rhythm Lungs: Clear to auscultation bilaterally. Vascular: No carotid bruits. Neurological Exam: Mental status: alert and oriented to person, place, and time, recent and remote memory intact, fund of knowledge intact, attention and concentration intact, speech fluent and not dysarthric, language intact. Cranial nerves: CN I: not tested CN II: pupils equal, round and reactive to light, visual fields intact CN III, IV, VI:  full range of motion, no nystagmus, slight saccades with tracking, no ptosis CN V: facial sensation intact CN VII: upper and lower face symmetric CN VIII: hearing intact CN IX, X: gag intact, uvula midline CN XI: sternocleidomastoid and trapezius muscles intact CN XII: tongue midline Bulk & Tone: normal, no fasciculations. Motor:  5/5 throughout  Sensation: temperature sensation intact and vibration sensation slightly reduced in toes. Deep Tendon Reflexes:  3+ throughout, toes downgoing.  Hoffman sign absent Finger to nose testing:  Without dysmetria.  Heel to shin:  Without dysmetria.  Gait:  Normal station and stride but upper body slightly side bent towards the right.  Takes care while turning. Romberg with sway.  When first closed eye, fell towards the  right but I caught her.  IMPRESSION: Gait instability with frequent falls, occasional/separate dizziness.  Likely secondary to cerebrovascular disease involving the cerebellum (slightly more pronounced on the right).  She demonstrates brisk reflexes but no clear abnormal reflexes such as Babinski or Hoffman sign.  Cervical stenosis is possible as well. Restless leg syndrome Hypertension  PLAN: 1.  Management is focused on supportive care.  She  is on anticoagulation and is a fall risk.  It is most important to minimize falls.  We will refer her back to PT for gait and balance training.  I request assessment for appropriate assisted devices (cane or walker) if needed. 2.  For restless leg, will check ferritin level and recommended soap under the sheets. 3.  I do not feel imaging of the cervical spine or intracranial/extracranial arterial system is needed, as she likely is not a surgical candidate anyway.  She is already on anticoagulation, which is optimal medical management for cerebrovascular disease. 4.  BP elevated today.  Should be re-evaluated by PCP. 5.  Follow up in 3 months.  Thank you for allowing me to take part in the care of this patient.  Metta Clines, DO  CC:  [name], MD

## 2016-07-30 NOTE — Patient Instructions (Addendum)
I think the balance and dizziness is probably due to old tiny strokes in the brain.  The balance problems may also be coming from the neck, but I wouldn't pursue any imaging of the neck because it likely won't change management (for example, I don't think you would be a surgical candidate).  The most important thing is to do everything we can to prevent further falls.  1.  I want you to go back to physical therapy to address balance issues and to be assessed for any recommendations regarding assisted devices (cane or walker) 2.  Follow up after 3 months for re-evaluation 3.  For restless leg, put the soap under the sheets by your feet.  We will also check a ferritin level as low iron may cause restless leg.

## 2016-07-30 NOTE — Progress Notes (Signed)
Chart forwarded.

## 2016-08-03 ENCOUNTER — Telehealth: Payer: Self-pay

## 2016-08-03 NOTE — Telephone Encounter (Signed)
Sent via mychart

## 2016-08-03 NOTE — Telephone Encounter (Signed)
-----   Message from Pieter Partridge, DO sent at 08/03/2016  6:54 AM EDT ----- Ferritin level is on the low-normal side (19.8).  This may be contributing to restless leg type symptoms.  I would recommend that she contact her PCP about possible supplement.

## 2016-08-09 DIAGNOSIS — R42 Dizziness and giddiness: Secondary | ICD-10-CM | POA: Diagnosis not present

## 2016-08-09 DIAGNOSIS — R278 Other lack of coordination: Secondary | ICD-10-CM | POA: Diagnosis not present

## 2016-08-09 DIAGNOSIS — R2681 Unsteadiness on feet: Secondary | ICD-10-CM | POA: Diagnosis not present

## 2016-08-09 DIAGNOSIS — Z9181 History of falling: Secondary | ICD-10-CM | POA: Diagnosis not present

## 2016-08-10 DIAGNOSIS — R278 Other lack of coordination: Secondary | ICD-10-CM | POA: Diagnosis not present

## 2016-08-10 DIAGNOSIS — Z9181 History of falling: Secondary | ICD-10-CM | POA: Diagnosis not present

## 2016-08-10 DIAGNOSIS — R42 Dizziness and giddiness: Secondary | ICD-10-CM | POA: Diagnosis not present

## 2016-08-10 DIAGNOSIS — R2681 Unsteadiness on feet: Secondary | ICD-10-CM | POA: Diagnosis not present

## 2016-08-13 DIAGNOSIS — R42 Dizziness and giddiness: Secondary | ICD-10-CM | POA: Diagnosis not present

## 2016-08-13 DIAGNOSIS — Z9181 History of falling: Secondary | ICD-10-CM | POA: Diagnosis not present

## 2016-08-13 DIAGNOSIS — R2681 Unsteadiness on feet: Secondary | ICD-10-CM | POA: Diagnosis not present

## 2016-08-13 DIAGNOSIS — R278 Other lack of coordination: Secondary | ICD-10-CM | POA: Diagnosis not present

## 2016-08-16 DIAGNOSIS — R2681 Unsteadiness on feet: Secondary | ICD-10-CM | POA: Diagnosis not present

## 2016-08-16 DIAGNOSIS — R278 Other lack of coordination: Secondary | ICD-10-CM | POA: Diagnosis not present

## 2016-08-16 DIAGNOSIS — R42 Dizziness and giddiness: Secondary | ICD-10-CM | POA: Diagnosis not present

## 2016-08-16 DIAGNOSIS — Z9181 History of falling: Secondary | ICD-10-CM | POA: Diagnosis not present

## 2016-08-17 DIAGNOSIS — R42 Dizziness and giddiness: Secondary | ICD-10-CM | POA: Diagnosis not present

## 2016-08-17 DIAGNOSIS — R2681 Unsteadiness on feet: Secondary | ICD-10-CM | POA: Diagnosis not present

## 2016-08-17 DIAGNOSIS — R278 Other lack of coordination: Secondary | ICD-10-CM | POA: Diagnosis not present

## 2016-08-17 DIAGNOSIS — Z9181 History of falling: Secondary | ICD-10-CM | POA: Diagnosis not present

## 2016-08-20 DIAGNOSIS — Z9181 History of falling: Secondary | ICD-10-CM | POA: Diagnosis not present

## 2016-08-20 DIAGNOSIS — R278 Other lack of coordination: Secondary | ICD-10-CM | POA: Diagnosis not present

## 2016-08-20 DIAGNOSIS — R2681 Unsteadiness on feet: Secondary | ICD-10-CM | POA: Diagnosis not present

## 2016-08-20 DIAGNOSIS — R42 Dizziness and giddiness: Secondary | ICD-10-CM | POA: Diagnosis not present

## 2016-08-23 DIAGNOSIS — Z9181 History of falling: Secondary | ICD-10-CM | POA: Diagnosis not present

## 2016-08-23 DIAGNOSIS — R42 Dizziness and giddiness: Secondary | ICD-10-CM | POA: Diagnosis not present

## 2016-08-23 DIAGNOSIS — R2681 Unsteadiness on feet: Secondary | ICD-10-CM | POA: Diagnosis not present

## 2016-08-23 DIAGNOSIS — R278 Other lack of coordination: Secondary | ICD-10-CM | POA: Diagnosis not present

## 2016-08-24 DIAGNOSIS — R2681 Unsteadiness on feet: Secondary | ICD-10-CM | POA: Diagnosis not present

## 2016-08-24 DIAGNOSIS — Z9181 History of falling: Secondary | ICD-10-CM | POA: Diagnosis not present

## 2016-08-24 DIAGNOSIS — R278 Other lack of coordination: Secondary | ICD-10-CM | POA: Diagnosis not present

## 2016-08-24 DIAGNOSIS — R42 Dizziness and giddiness: Secondary | ICD-10-CM | POA: Diagnosis not present

## 2016-08-26 DIAGNOSIS — Z9181 History of falling: Secondary | ICD-10-CM | POA: Diagnosis not present

## 2016-08-26 DIAGNOSIS — R2681 Unsteadiness on feet: Secondary | ICD-10-CM | POA: Diagnosis not present

## 2016-08-26 DIAGNOSIS — R278 Other lack of coordination: Secondary | ICD-10-CM | POA: Diagnosis not present

## 2016-08-26 DIAGNOSIS — R42 Dizziness and giddiness: Secondary | ICD-10-CM | POA: Diagnosis not present

## 2016-08-27 DIAGNOSIS — L821 Other seborrheic keratosis: Secondary | ICD-10-CM | POA: Diagnosis not present

## 2016-08-27 DIAGNOSIS — L84 Corns and callosities: Secondary | ICD-10-CM | POA: Diagnosis not present

## 2016-08-27 DIAGNOSIS — L814 Other melanin hyperpigmentation: Secondary | ICD-10-CM | POA: Diagnosis not present

## 2016-09-22 DIAGNOSIS — F5101 Primary insomnia: Secondary | ICD-10-CM | POA: Diagnosis not present

## 2016-09-22 DIAGNOSIS — G2581 Restless legs syndrome: Secondary | ICD-10-CM | POA: Diagnosis not present

## 2016-09-22 DIAGNOSIS — J340 Abscess, furuncle and carbuncle of nose: Secondary | ICD-10-CM | POA: Diagnosis not present

## 2016-09-22 DIAGNOSIS — I10 Essential (primary) hypertension: Secondary | ICD-10-CM | POA: Diagnosis not present

## 2016-09-22 DIAGNOSIS — I480 Paroxysmal atrial fibrillation: Secondary | ICD-10-CM | POA: Diagnosis not present

## 2016-11-17 ENCOUNTER — Ambulatory Visit (INDEPENDENT_AMBULATORY_CARE_PROVIDER_SITE_OTHER): Payer: Medicare Other | Admitting: Neurology

## 2016-11-17 ENCOUNTER — Encounter: Payer: Self-pay | Admitting: Neurology

## 2016-11-17 ENCOUNTER — Other Ambulatory Visit (INDEPENDENT_AMBULATORY_CARE_PROVIDER_SITE_OTHER): Payer: Medicare Other

## 2016-11-17 VITALS — BP 138/58 | HR 60 | Ht 61.0 in | Wt 105.5 lb

## 2016-11-17 DIAGNOSIS — R42 Dizziness and giddiness: Secondary | ICD-10-CM | POA: Diagnosis not present

## 2016-11-17 DIAGNOSIS — R2681 Unsteadiness on feet: Secondary | ICD-10-CM | POA: Diagnosis not present

## 2016-11-17 DIAGNOSIS — G4733 Obstructive sleep apnea (adult) (pediatric): Secondary | ICD-10-CM | POA: Diagnosis not present

## 2016-11-17 DIAGNOSIS — E538 Deficiency of other specified B group vitamins: Secondary | ICD-10-CM

## 2016-11-17 DIAGNOSIS — R296 Repeated falls: Secondary | ICD-10-CM | POA: Diagnosis not present

## 2016-11-17 DIAGNOSIS — H9193 Unspecified hearing loss, bilateral: Secondary | ICD-10-CM | POA: Diagnosis not present

## 2016-11-17 DIAGNOSIS — J0141 Acute recurrent pansinusitis: Secondary | ICD-10-CM | POA: Diagnosis not present

## 2016-11-17 DIAGNOSIS — J343 Hypertrophy of nasal turbinates: Secondary | ICD-10-CM | POA: Diagnosis not present

## 2016-11-17 DIAGNOSIS — I679 Cerebrovascular disease, unspecified: Secondary | ICD-10-CM | POA: Diagnosis not present

## 2016-11-17 LAB — VITAMIN B12: Vitamin B-12: 365 pg/mL (ref 211–911)

## 2016-11-17 NOTE — Progress Notes (Addendum)
NEUROLOGY FOLLOW UP OFFICE NOTE  Maria [name] [id number]  HISTORY OF PRESENT ILLNESS: Maria [name] is a 83 year old woman with paroxysmal atrial fibrillation, macular degeneration, depression, HTN, and history of MI who follows up for dizziness secondary to cerebrovascular disease, frequent falls and restless leg.  She is accompanied by her daughter who supplements history of her symtpoms.  UPDATE: To evaluate restless leg, ferritin level was checked, which was low-normal at 19.8.  I recommended contacting her PCP about possible supplementation. She is not sure if she was ever treated for low iron.   She tried sleeping with bar of soap under the sheets but it did not help.  While she endorses restless leg symptoms in both legs, she has particular uncomfortable "tight" and "prickly" sensation in the right leg (the same leg where she had a DVT, vein graft and fractured hip).  PT helped with gait and dizziness but once it stopped, she did not continue with home exercises and dizziness and balance problems returned.  She does not use the cane in the house.   No falls, however.  HISTORY: She has had several falls over the past 3 years.  Sometimes they are associated with dizziness, but not always.  Her first fall occurred after open-heart surgery, when she fell and hit her head after turning and stepping off the rug onto the tile floor.  She fractured her hip.  Another time, it occurred when she leaned forward to flush the toilet.  She has described episodes of dizziness.  Sometimes it is a spinning sensation lasting a couple of minutes with change in position, such as turning her head or laying down in bed.  She denies neck pain or weakness in the legs.  She doesn't always feel unsteady when walking but when she falls, it tends to be towards the right.  She did not respond to vestibular rehab.  She does not use a walking assisted device.    She has had several brain imaging.  MRI of brain with  and without contrast from 01/16/16 was personally reviewed and revealed chronic small vessel ischemic changes in the cerebral white matter and cerebellum, but no acute changes.  CT of head from 03/02/16 and 06/16/16 were personally reviewed and were negative for acute changes.   She has atrial fibrillation and is on Xarelto.     Of note, she also has restless leg.   Carotid doppler from 2011 showed no hemodynamically significant stenosis in the ICAs and antegrade flow in the vertebral arteries.  PAST MEDICAL HISTORY: Past Medical History:  Diagnosis Date  . Anemia   . Atonic bladder 12/11/2013  . Atrial fibrillation ([place])   . Carotid artery occlusion    right carotid stenosis 40-60%, 4/08, neg for stenosis repeat study 11/11  . Celiac disease   . Chronic anticoagulation CHADS2VASC2 score 7 10/18/2014  . Chronic diastolic CHF (congestive heart failure) (HCC)    takes Lasix daily  . CKD (chronic kidney disease), stage III   . Coronary artery disease    a.  08/2013 (LIMA-LAD, rSVG to LCX and Lt atrial clip),  . Depression   . DVT (deep venous thrombosis) ([place]) 09/2013  . Gastric ulcer    6-98month ago  . GERD (gastroesophageal reflux disease)    takes Omeprazole daily  . GI bleeding   . Hemorrhoids   . Hot flashes    takes ZOloft 3 times a week  . Hypertension   . Hypothyroidism  takes Synthroid daily as result of AMiodarone  . Insomnia   . Macular degeneration   . Mild aortic insufficiency    a. Echo 10/2013: mild AI.  Marland Kitchen Moderate mitral regurgitation by prior echocardiogram    a. Echo 10/2013.  . Moderate obstructive sleep apnea    uses CPAP;sleep study about 4-74month ago  . MVP (mitral valve prolapse)   . Osteoporosis   . PAF (paroxysmal atrial fibrillation) ([place]   . Peripheral edema    left  . Presbycusis   . Pulmonary hypertension   . RLS (restless legs syndrome)   . Thyroid nodule    734m no change 11/11  . Urethral stricture   . Urinary anastomotic stricture     . Urinary retention   . Vitamin D deficiency     MEDICATIONS: Current Outpatient Prescriptions on File Prior to Visit  Medication Sig Dispense Refill  . acetaminophen (TYLENOL) 325 MG tablet Take 650 mg by mouth every 6 (six) hours as needed.    . Marland Kitchenmiodarone (PACERONE) 100 MG tablet Take 2 tablets (200 mg total) by mouth 2 (two) times daily. (Patient taking differently: Take 100 mg by mouth daily. )    . amLODipine (NORVASC) 5 MG tablet Take 5 mg by mouth every Monday, Wednesday, and Friday.    . Cholecalciferol 1000 UNITS tablet Take 1,000 Units by mouth daily.    . ferrous sulfate 325 (65 FE) MG tablet Take 325 mg by mouth daily with breakfast.    . fluticasone (FLONASE) 50 MCG/ACT nasal spray 1 spray each nares twice a day 10 g 1  . furosemide (LASIX) 20 MG tablet Take 20 mg by mouth daily.    . Marland Kitchenevothyroxine (SYNTHROID, LEVOTHROID) 50 MCG tablet Take 1 tablet (50 mcg total) by mouth daily before breakfast. 30 tablet 11  . meclizine (ANTIVERT) 12.5 MG tablet Take 1 tablet (12.5 mg total) by mouth 3 (three) times daily as needed for dizziness. 30 tablet 0  . MELATONIN ER PO Take by mouth.    . MULTIPLE VITAMIN PO Take 1 tablet by mouth daily.    . polyethylene glycol (MIRALAX / GLYCOLAX) packet Take 17 g by mouth daily.    . Rivaroxaban (XARELTO) 15 MG TABS tablet TAKE ONE TABLET BY MOUTH ONCE DAILY WITH SUPPER 90 tablet 2  . sertraline (ZOLOFT) 50 MG tablet Take 25 mg by mouth daily.     No current facility-administered medications on file prior to visit.     ALLERGIES: Allergies  Allergen Reactions  . Amoxicillin Other (See Comments)    Nausea, dizziness  . Codeine Nausea And Vomiting  . Fosamax [Alendronate Sodium] Other (See Comments)    Jaw pain  . Gluten Meal Other (See Comments)    Celiac Disease  . Hctz [Hydrochlorothiazide] Other (See Comments)    Hyponatremia, GI upset  . Tetanus Toxoids Other (See Comments)    Bad local reaction    FAMILY HISTORY: Family  History  Problem Relation Age of Onset  . Heart attack Mother   . CVA Mother   . CAD Father   . Colon cancer Father   . Heart attack Father   . Heart attack Brother   . Peripheral vascular disease Brother   . AAA (abdominal aortic aneurysm) Brother     SOCIAL HISTORY: Social History   Social History  . Marital status: Widowed    Spouse name: N/A  . Number of children: N/A  . Years of education: N/A   Occupational History  .  Not on file.   Social History Main Topics  . Smoking status: Never Smoker  . Smokeless tobacco: Never Used  . Alcohol use No  . Drug use: No  . Sexual activity: Yes    Birth control/ protection: Surgical   Other Topics Concern  . Not on file   Social History Narrative  . No narrative on file    REVIEW OF SYSTEMS: Constitutional: No fevers, chills, or sweats, no generalized fatigue, change in appetite Eyes: No visual changes, double vision, eye pain Ear, nose and throat: No hearing loss, ear pain, nasal congestion, sore throat Cardiovascular: No chest pain, palpitations Respiratory:  No shortness of breath at rest or with exertion, wheezes GastrointestinaI: No nausea, vomiting, diarrhea, abdominal pain, fecal incontinence Genitourinary:  No dysuria, urinary retention or frequency Musculoskeletal:  No neck pain, back pain Integumentary: No rash, pruritus, skin lesions Neurological: as above Psychiatric: No depression, insomnia, anxiety Endocrine: No palpitations, fatigue, diaphoresis, mood swings, change in appetite, change in weight, increased thirst Hematologic/Lymphatic:  No purpura, petechiae. Allergic/Immunologic: no itchy/runny eyes, nasal congestion, recent allergic reactions, rashes  PHYSICAL EXAM: Vitals:   11/17/16 1142  BP: (!) 138/58  Pulse: 60   General: No acute distress.  Patient appears well-groomed.  normal body habitus. Head:  Normocephalic/atraumatic Eyes:  Fundi examined but not visualized Neck: supple, no  paraspinal tenderness, full range of motion Heart:  Regular rate and rhythm Lungs:  Clear to auscultation bilaterally Back: No paraspinal tenderness Neurological Exam: alert and oriented to person, place, and time. Attention span and concentration intact, recent and remote memory intact, fund of knowledge intact.  Speech fluent and not dysarthric, language intact.  CN II-XII intact. Bulk and tone normal, muscle strength 5/5 throughout.  Sensation to light touch  intact.  Deep tendon reflexes 2+ upper extremities, 3+ lower extremities.  Finger to nose testing intact.  Gait cautious..  IMPRESSION: Gait instability with frequent falls, occasional/separate dizziness.  Likely secondary to cerebrovascular disease involving the cerebellum (slightly more pronounced on the right).   Restless leg syndrome  PLAN: 1.  Continue exercises for walking and dizziness 2.  Recommend over the counter Calm Legs supplement pill 3.  Ferritin (iron) level in October was 19.8.  Recommended supplementation with goal ferritin level greater than 45 may treat restless leg.  However, Calm Leg pills have iron in it, so it may be beneficial to first see how she responds on Calm Leg before adding additional supplementation.  Recheck ferritin level prior to follow up. 4.  Will check B12 level, which deficiency may cause "prickly" discomfort in legs 5.  Use cane at all times. 6.  Follow up in 5 months.  27 minutes spent face to face with patient, over 50% spent discussing causes of leg symptoms, dizziness and recommended treatment.Maria Clines, DO  CC:  [name], MD

## 2016-11-17 NOTE — Patient Instructions (Addendum)
1.  Continue exercises for walking and dizziness 2.  Your ferritin (iron) level in October was 19.8.  This is low and may cause restless leg symptoms.  I would contact Dr. Felipa Eth about starting supplements and to evaluate for a cause.  Ideally, I would like the level to be greater than 45. 3.  You may also try over the counter Leg-Calm pill 4.  Follow up in 5 months.

## 2016-11-18 ENCOUNTER — Telehealth: Payer: Self-pay

## 2016-11-18 NOTE — Telephone Encounter (Signed)
Called. No answer. Will try later.

## 2016-11-18 NOTE — Telephone Encounter (Signed)
-----   Message from Pieter Partridge, DO sent at 11/17/2016  9:29 PM EST ----- B12 level is normal

## 2016-11-19 NOTE — Telephone Encounter (Signed)
Called patient.  No answer.

## 2016-11-24 ENCOUNTER — Telehealth: Payer: Self-pay

## 2016-11-24 DIAGNOSIS — Z23 Encounter for immunization: Secondary | ICD-10-CM | POA: Diagnosis not present

## 2016-11-24 DIAGNOSIS — Z79899 Other long term (current) drug therapy: Secondary | ICD-10-CM | POA: Diagnosis not present

## 2016-11-24 DIAGNOSIS — E039 Hypothyroidism, unspecified: Secondary | ICD-10-CM | POA: Diagnosis not present

## 2016-11-24 DIAGNOSIS — I10 Essential (primary) hypertension: Secondary | ICD-10-CM | POA: Diagnosis not present

## 2016-11-24 DIAGNOSIS — M810 Age-related osteoporosis without current pathological fracture: Secondary | ICD-10-CM | POA: Diagnosis not present

## 2016-11-24 DIAGNOSIS — I480 Paroxysmal atrial fibrillation: Secondary | ICD-10-CM | POA: Diagnosis not present

## 2016-11-24 DIAGNOSIS — K219 Gastro-esophageal reflux disease without esophagitis: Secondary | ICD-10-CM | POA: Diagnosis not present

## 2016-11-24 DIAGNOSIS — I700 Atherosclerosis of aorta: Secondary | ICD-10-CM | POA: Diagnosis not present

## 2016-11-24 DIAGNOSIS — J449 Chronic obstructive pulmonary disease, unspecified: Secondary | ICD-10-CM | POA: Diagnosis not present

## 2016-11-24 DIAGNOSIS — Z1389 Encounter for screening for other disorder: Secondary | ICD-10-CM | POA: Diagnosis not present

## 2016-11-24 DIAGNOSIS — Z0000 Encounter for general adult medical examination without abnormal findings: Secondary | ICD-10-CM | POA: Diagnosis not present

## 2016-11-24 DIAGNOSIS — K900 Celiac disease: Secondary | ICD-10-CM | POA: Diagnosis not present

## 2016-11-24 DIAGNOSIS — E611 Iron deficiency: Secondary | ICD-10-CM | POA: Diagnosis not present

## 2016-11-24 NOTE — Telephone Encounter (Signed)
Called patient and daughter several times. No answer. Sent letter.

## 2016-11-24 NOTE — Telephone Encounter (Signed)
-----   Message from Pieter Partridge, DO sent at 11/18/2016  6:52 AM EST ----- Please let Maria [name] know that the Calm Legs supplement pill that I recommended yesterday contains iron in it.  So I recommend taking this daily without added iron supplement.  I would like to repeat ferritin level prior to follow up.  If the Calm Legs supplement is not effective and the ferritin level is at an appropriate level, then we can try switching to just a supplemental iron pill alone.

## 2016-12-03 ENCOUNTER — Telehealth: Payer: Self-pay | Admitting: Neurology

## 2016-12-03 NOTE — Telephone Encounter (Signed)
LMOM letting her know about iron supplement from previous phone message.

## 2016-12-03 NOTE — Telephone Encounter (Signed)
Maria [name] [birth year]. She is a patient of Dr. Georgie Chard. She was returning cassandra's call from a few days ago.  She was also sent a letter also. She would like to know about her results. Her # [phone]. You can lmom if she is not home. Thank you

## 2016-12-06 ENCOUNTER — Telehealth: Payer: Self-pay | Admitting: Neurology

## 2016-12-06 NOTE — Telephone Encounter (Signed)
Spoke to patient. She states she never was able to find the Calm Legs supplement. Had bloowork done @ Dr. Carlyle Lipa office recently and iron was very low so she was prescribed iron tabs.

## 2016-12-06 NOTE — Telephone Encounter (Signed)
Maria [name] July 29, 2024. Her # [phone]. Received a letter that she was unable to be reached. She has not received any medication from [facility]. Thank you

## 2016-12-15 DIAGNOSIS — J343 Hypertrophy of nasal turbinates: Secondary | ICD-10-CM | POA: Diagnosis not present

## 2016-12-15 DIAGNOSIS — J0141 Acute recurrent pansinusitis: Secondary | ICD-10-CM | POA: Diagnosis not present

## 2016-12-15 DIAGNOSIS — J310 Chronic rhinitis: Secondary | ICD-10-CM | POA: Diagnosis not present

## 2016-12-29 DIAGNOSIS — M62830 Muscle spasm of back: Secondary | ICD-10-CM | POA: Diagnosis not present

## 2017-01-04 DIAGNOSIS — M62830 Muscle spasm of back: Secondary | ICD-10-CM | POA: Diagnosis not present

## 2017-01-04 DIAGNOSIS — R293 Abnormal posture: Secondary | ICD-10-CM | POA: Diagnosis not present

## 2017-01-04 DIAGNOSIS — M810 Age-related osteoporosis without current pathological fracture: Secondary | ICD-10-CM | POA: Diagnosis not present

## 2017-01-04 DIAGNOSIS — M4125 Other idiopathic scoliosis, thoracolumbar region: Secondary | ICD-10-CM | POA: Diagnosis not present

## 2017-01-04 DIAGNOSIS — M545 Low back pain: Secondary | ICD-10-CM | POA: Diagnosis not present

## 2017-01-05 ENCOUNTER — Ambulatory Visit
Admission: RE | Admit: 2017-01-05 | Discharge: 2017-01-05 | Disposition: A | Discharge status: Home / Self Care | Payer: Medicare Other | Source: Ambulatory Visit | Attending: Internal Medicine | Admitting: Internal Medicine

## 2017-01-05 ENCOUNTER — Other Ambulatory Visit: Payer: Self-pay | Admitting: Internal Medicine

## 2017-01-05 DIAGNOSIS — S22000A Wedge compression fracture of unspecified thoracic vertebra, initial encounter for closed fracture: Secondary | ICD-10-CM | POA: Diagnosis not present

## 2017-01-05 DIAGNOSIS — M545 Low back pain: Secondary | ICD-10-CM | POA: Diagnosis not present

## 2017-01-05 DIAGNOSIS — M519 Unspecified thoracic, thoracolumbar and lumbosacral intervertebral disc disorder: Secondary | ICD-10-CM | POA: Diagnosis not present

## 2017-01-05 DIAGNOSIS — M546 Pain in thoracic spine: Secondary | ICD-10-CM | POA: Diagnosis not present

## 2017-01-05 DIAGNOSIS — R0781 Pleurodynia: Secondary | ICD-10-CM | POA: Diagnosis not present

## 2017-01-05 DIAGNOSIS — M549 Dorsalgia, unspecified: Secondary | ICD-10-CM

## 2017-01-11 DIAGNOSIS — M546 Pain in thoracic spine: Secondary | ICD-10-CM | POA: Diagnosis not present

## 2017-01-12 DIAGNOSIS — S22000A Wedge compression fracture of unspecified thoracic vertebra, initial encounter for closed fracture: Secondary | ICD-10-CM | POA: Diagnosis not present

## 2017-01-13 ENCOUNTER — Other Ambulatory Visit: Payer: Self-pay | Admitting: Geriatric Medicine

## 2017-01-13 ENCOUNTER — Other Ambulatory Visit (HOSPITAL_COMMUNITY): Payer: Self-pay | Admitting: Interventional Radiology

## 2017-01-13 DIAGNOSIS — IMO0001 Reserved for inherently not codable concepts without codable children: Secondary | ICD-10-CM

## 2017-01-13 DIAGNOSIS — S22000A Wedge compression fracture of unspecified thoracic vertebra, initial encounter for closed fracture: Secondary | ICD-10-CM

## 2017-01-13 DIAGNOSIS — M4850XA Collapsed vertebra, not elsewhere classified, site unspecified, initial encounter for fracture: Principal | ICD-10-CM

## 2017-01-25 ENCOUNTER — Ambulatory Visit (HOSPITAL_COMMUNITY): Payer: Medicare Other

## 2017-02-01 ENCOUNTER — Ambulatory Visit
Admission: RE | Admit: 2017-02-01 | Discharge: 2017-02-01 | Disposition: A | Discharge status: Home / Self Care | Payer: Medicare Other | Source: Ambulatory Visit | Attending: Geriatric Medicine | Admitting: Geriatric Medicine

## 2017-02-01 DIAGNOSIS — S22000A Wedge compression fracture of unspecified thoracic vertebra, initial encounter for closed fracture: Secondary | ICD-10-CM

## 2017-02-01 DIAGNOSIS — M47814 Spondylosis without myelopathy or radiculopathy, thoracic region: Secondary | ICD-10-CM | POA: Diagnosis not present

## 2017-02-01 DIAGNOSIS — M48061 Spinal stenosis, lumbar region without neurogenic claudication: Secondary | ICD-10-CM | POA: Diagnosis not present

## 2017-02-02 ENCOUNTER — Ambulatory Visit (HOSPITAL_COMMUNITY): Admission: RE | Admit: 2017-02-02 | Payer: Medicare Other | Source: Ambulatory Visit

## 2017-02-02 ENCOUNTER — Telehealth (HOSPITAL_COMMUNITY): Payer: Self-pay

## 2017-02-02 NOTE — Telephone Encounter (Signed)
Called to reschedule consult, left message for pt to return call. AW

## 2017-02-02 NOTE — Telephone Encounter (Signed)
Pt stated that she was feeling a little better. Even better since the last time we spoke. She just got back from the beach with her family. She felt she was mostly sore because of the drive. Will hold off on consult since pt is improving. Will call back in a couple of weeks to check pt's status.  AW

## 2017-02-22 DIAGNOSIS — F5101 Primary insomnia: Secondary | ICD-10-CM | POA: Diagnosis not present

## 2017-02-22 DIAGNOSIS — M810 Age-related osteoporosis without current pathological fracture: Secondary | ICD-10-CM | POA: Diagnosis not present

## 2017-02-22 DIAGNOSIS — I10 Essential (primary) hypertension: Secondary | ICD-10-CM | POA: Diagnosis not present

## 2017-03-16 DIAGNOSIS — M810 Age-related osteoporosis without current pathological fracture: Secondary | ICD-10-CM | POA: Diagnosis not present

## 2017-04-11 DIAGNOSIS — H1859 Other hereditary corneal dystrophies: Secondary | ICD-10-CM | POA: Diagnosis not present

## 2017-04-11 DIAGNOSIS — H43813 Vitreous degeneration, bilateral: Secondary | ICD-10-CM | POA: Diagnosis not present

## 2017-04-11 DIAGNOSIS — Z961 Presence of intraocular lens: Secondary | ICD-10-CM | POA: Diagnosis not present

## 2017-04-11 DIAGNOSIS — H52203 Unspecified astigmatism, bilateral: Secondary | ICD-10-CM | POA: Diagnosis not present

## 2017-05-02 DIAGNOSIS — R5383 Other fatigue: Secondary | ICD-10-CM | POA: Diagnosis not present

## 2017-05-02 DIAGNOSIS — G2581 Restless legs syndrome: Secondary | ICD-10-CM | POA: Diagnosis not present

## 2017-05-02 DIAGNOSIS — Z7189 Other specified counseling: Secondary | ICD-10-CM | POA: Diagnosis not present

## 2017-05-02 DIAGNOSIS — I10 Essential (primary) hypertension: Secondary | ICD-10-CM | POA: Diagnosis not present

## 2017-05-02 DIAGNOSIS — I480 Paroxysmal atrial fibrillation: Secondary | ICD-10-CM | POA: Diagnosis not present

## 2017-05-02 DIAGNOSIS — Z79899 Other long term (current) drug therapy: Secondary | ICD-10-CM | POA: Diagnosis not present

## 2017-05-09 ENCOUNTER — Other Ambulatory Visit: Payer: Self-pay

## 2017-05-09 ENCOUNTER — Telehealth: Payer: Self-pay | Admitting: Interventional Cardiology

## 2017-05-09 NOTE — Telephone Encounter (Signed)
Pt is overdue for follow-up with Dr [name].  Last seen in 01/2016 supposed to have followed up with Dr [name] in 6-9 months.

## 2017-05-09 NOTE — Telephone Encounter (Signed)
New message     *STAT* If patient is at the pharmacy, call can be transferred to refill team.    1. Which medications need to be refilled? (please list name of each medication and dose if known) Rivaroxaban (XARELTO) 15 MG TABS tablet  2. Which pharmacy/location (including street and city if local pharmacy) is medication to be sent to?[facility]   3. Do they need a 30 day or 90 day supply? 90 days

## 2017-05-09 NOTE — Telephone Encounter (Signed)
Pt last saw Dr [name] 01/26/16, overdue for 6-9 mo follow-up, attempted to call pt to notify, [facility] needs follow-up appt with Dr [name].  Last labs 07/21/16 Creat 0.72, weight 47.9, age 83, CrCl 38.48, based on CrCl pt is on appropriate dosage of Xarelto 71m QD.  Needs f/u appt with Dr STamala Julianscheduled to refill rx.

## 2017-05-12 NOTE — Telephone Encounter (Signed)
Spoke with patient. Made patient aware that she is overdue for follow up and labs and that we would need her to be seen in order to continue to fill Xarelto. She informed me she had her [facility] Doctor to refill Xarelto for this month. I told her whether or not he continued to fill her Xarelto she would still need to have follow up with Dr. [name]. I transferred her to scheduling to have follow up scheduled and follow up labs scheduled. She is agreeable to plan on follow up.

## 2017-05-16 MED ORDER — RIVAROXABAN 15 MG PO TABS: ORAL_TABLET | ORAL | 0 refills | Status: DC

## 2017-05-16 NOTE — Telephone Encounter (Signed)
Pt scheduled appt to see Dr [name] on 07/08/17, note placed on appt pt needs repeat CBC and BMP f/u Xarelto labwork. Refill Xarelto 26m tablets x 90 day supply, will recheck labwork from upcoming appt with Dr STamala Julianfor future refills.

## 2017-05-20 ENCOUNTER — Telehealth: Payer: Self-pay | Admitting: Interventional Cardiology

## 2017-05-20 NOTE — Telephone Encounter (Signed)
Routing to pharmacy. 

## 2017-05-20 NOTE — Telephone Encounter (Signed)
Pt was being called because she was overdue for her f/u with Dr [name]. This has since been scheduled. Pt also on Xarelto, not Eliquis.

## 2017-05-20 NOTE — Telephone Encounter (Signed)
Patient states that she keeps getting calls from our office about her refill for Eliquis. Patient states that she has a scheduled appt with Dr. [name] and that she has the medication. Patient states that she does not need any more calls

## 2017-06-17 ENCOUNTER — Encounter: Payer: Self-pay | Admitting: Interventional Cardiology

## 2017-06-20 DIAGNOSIS — N390 Urinary tract infection, site not specified: Secondary | ICD-10-CM | POA: Diagnosis not present

## 2017-07-01 ENCOUNTER — Ambulatory Visit (INDEPENDENT_AMBULATORY_CARE_PROVIDER_SITE_OTHER): Payer: Medicare Other | Admitting: Neurology

## 2017-07-01 ENCOUNTER — Encounter: Payer: Self-pay | Admitting: Neurology

## 2017-07-01 ENCOUNTER — Other Ambulatory Visit: Payer: Medicare Other

## 2017-07-01 VITALS — BP 136/52 | HR 55 | Ht 60.1 in | Wt 103.3 lb

## 2017-07-01 DIAGNOSIS — R42 Dizziness and giddiness: Secondary | ICD-10-CM

## 2017-07-01 DIAGNOSIS — G2581 Restless legs syndrome: Secondary | ICD-10-CM | POA: Diagnosis not present

## 2017-07-01 DIAGNOSIS — E611 Iron deficiency: Secondary | ICD-10-CM | POA: Diagnosis not present

## 2017-07-01 LAB — FERRITIN: Ferritin: 23 ng/mL (ref 20–288)

## 2017-07-01 NOTE — Patient Instructions (Signed)
1.  Check ferritin level again 2.  Follow up as needed

## 2017-07-01 NOTE — Progress Notes (Signed)
NEUROLOGY FOLLOW UP OFFICE NOTE  Maria [name] [id number]  HISTORY OF PRESENT ILLNESS: Maria [name] is a 83 year old woman with paroxysmal atrial fibrillation, macular degeneration, depression, HTN, and history of MI who follows up for dizziness and restless leg.  She is accompanied by her daughter who supplements history.   UPDATE: B12 from 11/17/16 was 365. She was started on iron supplementation by Dr. Felipa Eth.  However, it was subsequently discontinued. She continues to report restless leg symptoms.  HISTORY: She has had several falls over the past several years.  Sometimes they are associated with dizziness, but not always.  Her first fall occurred after open-heart surgery, when she fell and hit her head after turning and stepping off the rug onto the tile floor.  She fractured her hip.  Another time, it occurred when she leaned forward to flush the toilet.  She has described episodes of dizziness.  Sometimes it is a spinning sensation lasting a couple of minutes with change in position, such as turning her head or laying down in bed.  She denies neck pain or weakness in the legs.  She doesn't always feel unsteady when walking but when she falls, it tends to be towards the right.  She has tried vestibular rehabilitation and physical therapy which provided brief relief.   She also has restless leg syndrome.  , ferritin level was low-normal at 19.8.  I recommended contacting her PCP about possible supplementation. She is not sure if she was ever treated for low iron.   She tried sleeping with bar of soap under the sheets but it did not help.  While she endorses restless leg symptoms in both legs, she has particular uncomfortable "tight" and "prickly" sensation in the right leg (the same leg where she had a DVT, vein graft and fractured hip).   She has had several brain imaging.  MRI of brain with and without contrast from 01/16/16 was personally reviewed and revealed chronic small  vessel ischemic changes in the cerebral white matter and cerebellum, but no acute changes.  CT of head from 03/02/16 and 06/16/16 were personally reviewed and were negative for acute changes.   She has atrial fibrillation and is on Xarelto.     Of note, she also has restless leg.   Carotid doppler from 2011 showed no hemodynamically significant stenosis in the ICAs and antegrade flow in the vertebral arteries.  MRI lumbar spine from 02/01/17 demonstrated multilevel degenerative changes with neural foraminal stenosis.  PAST MEDICAL HISTORY: Past Medical History:  Diagnosis Date  . Anemia   . Atonic bladder 12/11/2013  . Atrial fibrillation ([place])   . Carotid artery occlusion    right carotid stenosis 40-60%, 4/08, neg for stenosis repeat study 11/11  . Celiac disease   . Chronic anticoagulation CHADS2VASC2 score 7 10/18/2014  . Chronic diastolic CHF (congestive heart failure) ([facility])    takes Lasix daily  . CKD (chronic kidney disease), stage III   . Coronary artery disease    a.  08/2013 (LIMA-LAD, rSVG to LCX and Lt atrial clip),  . Depression   . DVT (deep venous thrombosis) ([place]) 09/2013  . Gastric ulcer    6-65month ago  . GERD (gastroesophageal reflux disease)    takes Omeprazole daily  . GI bleeding   . Hemorrhoids   . Hot flashes    takes ZOloft 3 times a week  . Hypertension   . Hypothyroidism    takes Synthroid daily as result of AMiodarone  .  Insomnia   . Macular degeneration   . Mild aortic insufficiency    a. Echo 10/2013: mild AI.  Marland Kitchen Moderate mitral regurgitation by prior echocardiogram    a. Echo 10/2013.  . Moderate obstructive sleep apnea    uses CPAP;sleep study about 4-59month ago  . MVP (mitral valve prolapse)   . Osteoporosis   . PAF ([place]mal [place] fibrillation) [place]   . Peripheral edema    left  . Presbycusis   . Pulmonary hypertension ([place]   . RLS (restless legs syndrome)   . Thyroid nodule    74m no change 11/11  . Urethral stricture   .  Urinary anastomotic stricture   . Urinary retention   . Vitamin D deficiency     MEDICATIONS: Current Outpatient Prescriptions on File Prior to Visit  Medication Sig Dispense Refill  . acetaminophen (TYLENOL) 325 MG tablet Take 650 mg by mouth every 6 (six) hours as needed.    . Marland Kitchenmiodarone (PACERONE) 100 MG tablet Take 2 tablets (200 mg total) by mouth 2 (two) times daily. (Patient taking differently: Take 100 mg by mouth daily. )    . amLODipine (NORVASC) 5 MG tablet Take 5 mg by mouth every Monday, Wednesday, and Friday.    . Cholecalciferol 1000 UNITS tablet Take 1,000 Units by mouth daily.    . furosemide (LASIX) 20 MG tablet Take 20 mg by mouth daily.    . Marland Kitchenevothyroxine (SYNTHROID, LEVOTHROID) 50 MCG tablet Take 1 tablet (50 mcg total) by mouth daily before breakfast. 30 tablet 11  . MELATONIN ER PO Take by mouth.    . MULTIPLE VITAMIN PO Take 1 tablet by mouth daily.    . polyethylene glycol (MIRALAX / GLYCOLAX) packet Take 17 g by mouth daily.    . Rivaroxaban (XARELTO) 15 MG TABS tablet TAKE ONE TABLET BY MOUTH ONCE DAILY WITH SUPPER 90 tablet 0  . sertraline (ZOLOFT) 50 MG tablet Take 25 mg by mouth daily.     No current facility-administered medications on file prior to visit.     ALLERGIES: Allergies  Allergen Reactions  . Amoxicillin Other (See Comments)    Nausea, dizziness  . Codeine Nausea And Vomiting  . Fosamax [Alendronate Sodium] Other (See Comments)    Jaw pain  . Gluten Meal Other (See Comments)    Celiac Disease  . Hctz [Hydrochlorothiazide] Other (See Comments)    Hyponatremia, GI upset  . Tetanus Toxoids Other (See Comments)    Bad local reaction    FAMILY HISTORY: Family History  Problem Relation Age of Onset  . Heart attack Mother   . CVA Mother   . CAD Father   . Colon cancer Father   . Heart attack Father   . Heart attack Brother   . Peripheral vascular disease Brother   . AAA (abdominal aortic aneurysm) Brother     SOCIAL  HISTORY: Social History   Social History  . Marital status: Widowed    Spouse name: N/A  . Number of children: N/A  . Years of education: N/A   Occupational History  . Not on file.   Social History Main Topics  . Smoking status: Never Smoker  . Smokeless tobacco: Never Used  . Alcohol use No  . Drug use: No  . Sexual activity: Yes    Birth control/ protection: Surgical   Other Topics Concern  . Not on file   Social History Narrative  . No narrative on file    REVIEW OF SYSTEMS:  Constitutional: No fevers, chills, or sweats, no generalized fatigue, change in appetite Eyes: No visual changes, double vision, eye pain Ear, nose and throat: No hearing loss, ear pain, nasal congestion, sore throat Cardiovascular: No chest pain, palpitations Respiratory:  No shortness of breath at rest or with exertion, wheezes GastrointestinaI: No nausea, vomiting, diarrhea, abdominal pain, fecal incontinence Genitourinary:  No dysuria, urinary retention or frequency Musculoskeletal:  No neck pain, back pain Integumentary: No rash, pruritus, skin lesions Neurological: as above Psychiatric: No depression, insomnia, anxiety Endocrine: No palpitations, fatigue, diaphoresis, mood swings, change in appetite, change in weight, increased thirst Hematologic/Lymphatic:  No purpura, petechiae. Allergic/Immunologic: no itchy/runny eyes, nasal congestion, recent allergic reactions, rashes  PHYSICAL EXAM: Vitals:   07/01/17 1437  BP: (!) 136/52  Pulse: (!) 55  SpO2: 97%   General: No acute distress.  Patient appears well-groomed.  normal body habitus. Head:  Normocephalic/atraumatic Eyes:  Fundi examined but not visualized Neck: supple, no paraspinal tenderness, full range of motion Heart:  Regular rate and rhythm Lungs:  Clear to auscultation bilaterally Back: No paraspinal tenderness Neurological Exam: alert and oriented to person, place, and time. Attention span and concentration intact,  recent and remote memory intact, fund of knowledge intact.  Speech fluent and not dysarthric, language intact.  CN II-XII intact. Bulk and tone normal, muscle strength 5/5 throughout.  Sensation to light touch  intact.  Deep tendon reflexes 2+ upper extremities, 3+ lower extremities.  Finger to nose testing intact.  Gait cautious..  IMPRESSION: Chronic dizziness, multifactorial but partly due to cerebrovascular disease.  Restless leg syndrome  PLAN: 1.  She would rather not try a prescription medication for RLS, such as gabapentin.  We will recheck ferritin level.  Treatment of restless leg syndrome due to low iron is to keep the ferritin level above 50.  If it is below 50, I would recommend restarting therapy.  I also provided a sheet discussing sleep hygiene. 2.  Follow up as needed.  21 minutes spent face to face with patient, over 50% spent discussing management.  Metta Clines, DO  CC:  [name], MD

## 2017-07-05 ENCOUNTER — Telehealth: Payer: Self-pay

## 2017-07-05 NOTE — Telephone Encounter (Signed)
Called and LM for PT advising of results, the FE recommendation and fiber if chooses to take FE, and to either f/u with [place] or [place].

## 2017-07-05 NOTE — Telephone Encounter (Signed)
-----   Message from Pieter Partridge, DO sent at 07/05/2017 10:42 AM EDT ----- She doesn't have a follow up appointment, so she may follow up with me if she wishes, or just follow up with Dr. Felipa Eth. ----- Message ----- From: Pieter Partridge, DO Sent: 07/05/2017  10:40 AM To: Clois Comber, CMA  Ms. Bucy's ferritin level is 23.  Even though that is low normal range, levels below 50 may cause restless leg symptoms.  Recommend taking ferrous sulfate 324m daily and see if restless leg symptoms improve.  We can recheck a ferritin level about a week prior to follow up.  She should note that taking iron supplements may cause constipation, so she may try eating increased fiber (prunes, etc) to help.

## 2017-07-08 ENCOUNTER — Ambulatory Visit: Payer: Medicare Other | Admitting: Interventional Cardiology

## 2017-07-13 ENCOUNTER — Ambulatory Visit (INDEPENDENT_AMBULATORY_CARE_PROVIDER_SITE_OTHER): Payer: Medicare Other | Admitting: Interventional Cardiology

## 2017-07-13 ENCOUNTER — Encounter: Payer: Self-pay | Admitting: Interventional Cardiology

## 2017-07-13 VITALS — BP 154/62 | HR 58 | Ht 60.0 in | Wt 107.4 lb

## 2017-07-13 DIAGNOSIS — Z7901 Long term (current) use of anticoagulants: Secondary | ICD-10-CM | POA: Diagnosis not present

## 2017-07-13 DIAGNOSIS — Z79899 Other long term (current) drug therapy: Secondary | ICD-10-CM

## 2017-07-13 DIAGNOSIS — I25709 Atherosclerosis of coronary artery bypass graft(s), unspecified, with unspecified angina pectoris: Secondary | ICD-10-CM | POA: Diagnosis not present

## 2017-07-13 DIAGNOSIS — R54 Age-related physical debility: Secondary | ICD-10-CM | POA: Diagnosis not present

## 2017-07-13 DIAGNOSIS — I495 Sick sinus syndrome: Secondary | ICD-10-CM | POA: Diagnosis not present

## 2017-07-13 DIAGNOSIS — Z23 Encounter for immunization: Secondary | ICD-10-CM | POA: Diagnosis not present

## 2017-07-13 DIAGNOSIS — N183 Chronic kidney disease, stage 3 unspecified: Secondary | ICD-10-CM

## 2017-07-13 DIAGNOSIS — I480 Paroxysmal atrial fibrillation: Secondary | ICD-10-CM

## 2017-07-13 DIAGNOSIS — I209 Angina pectoris, unspecified: Secondary | ICD-10-CM

## 2017-07-13 DIAGNOSIS — M810 Age-related osteoporosis without current pathological fracture: Secondary | ICD-10-CM | POA: Diagnosis not present

## 2017-07-13 DIAGNOSIS — I10 Essential (primary) hypertension: Secondary | ICD-10-CM

## 2017-07-13 DIAGNOSIS — J309 Allergic rhinitis, unspecified: Secondary | ICD-10-CM | POA: Diagnosis not present

## 2017-07-13 DIAGNOSIS — M25551 Pain in right hip: Secondary | ICD-10-CM | POA: Diagnosis not present

## 2017-07-13 DIAGNOSIS — G4733 Obstructive sleep apnea (adult) (pediatric): Secondary | ICD-10-CM | POA: Diagnosis not present

## 2017-07-13 DIAGNOSIS — G2581 Restless legs syndrome: Secondary | ICD-10-CM | POA: Diagnosis not present

## 2017-07-13 DIAGNOSIS — E039 Hypothyroidism, unspecified: Secondary | ICD-10-CM | POA: Diagnosis not present

## 2017-07-13 NOTE — Progress Notes (Signed)
Cardiology Office Note    Date:  07/13/2017   ID:  Maria [name], DOB 1924-08-08, MRN [id number]  PCP:  [name], MD  Cardiologist: [name], MD   Chief Complaint  Patient presents with  . Coronary Artery Disease  . Atrial Fibrillation    History of Present Illness:  Maria [name] is a 83 y.o. female follow-up of paroxysmal atrial fibrillation, amiodarone therapy, bilateral carotid stenosis, coronary artery disease with multivessel coronary bypass grafting 2014, chronic kidney disease, chronic anticoagulation with Xarelto, and hypertension.  This is a 6 month follow-up visit. The patient is being seen twice per year due to the requirement for low-dose amiodarone to maintain sinus rhythm. She has had no prolonged episodes of palpitation or what she considers to be atrial fibrillation. She has multiple other questions and concerns.  1 of the concerns as a sensation of numbness and dysesthesia along the left parasternal border radiating into the left breast. This is been present since surgery. It is continuous, more noticeable at some times than others.  She occasionally has for a focal left parasternal squeezing discomfort in the chest. It is not precipitated by physical activity. She cannot state the duration of the discomfort. It does not prevent physical activity.  She has episodes of dizziness. It is more profound when she had sudden change of direction or posture. Is more likely to occur with standing.  She has not had exertion-related chest pain. She does not have leg weakness or pain with ambulation. She denies orthopnea and PND. Brisk walking causes shortness of breath but no chest pain.  Past Medical History:  Diagnosis Date  . Anemia   . Atonic bladder 12/11/2013  . Atrial fibrillation ([place])   . Carotid artery occlusion    right carotid stenosis 40-60%, 4/08, neg for stenosis repeat study 11/11  . Celiac disease   . Chronic anticoagulation  CHADS2VASC2 score 7 10/18/2014  . Chronic diastolic CHF (congestive heart failure) (HCC)    takes Lasix daily  . CKD (chronic kidney disease), stage III   . Coronary artery disease    a.  08/2013 (LIMA-LAD, rSVG to LCX and Lt atrial clip),  . Depression   . DVT (deep venous thrombosis) ([place]) 09/2013  . Gastric ulcer    6-28month ago  . GERD (gastroesophageal reflux disease)    takes Omeprazole daily  . GI bleeding   . Hemorrhoids   . Hot flashes    takes ZOloft 3 times a week  . Hypertension   . Hypothyroidism    takes Synthroid daily as result of AMiodarone  . Insomnia   . Macular degeneration   . Mild aortic insufficiency    a. Echo 10/2013: mild AI.  .Marland KitchenModerate mitral regurgitation by prior echocardiogram    a. Echo 10/2013.  . Moderate obstructive sleep apnea    uses CPAP;sleep study about 4-59monthago  . MVP (mitral valve prolapse)   . Osteoporosis   . PAF (paroxysmal atrial fibrillation) ([place]  . Peripheral edema    left  . Presbycusis   . Pulmonary hypertension ([place]  . RLS (restless legs syndrome)   . Thyroid nodule    8m63mno change 11/11  . Urethral stricture   . Urinary anastomotic stricture   . Urinary retention   . Vitamin D deficiency     Past Surgical History:  Procedure Laterality Date  . APPENDECTOMY    . bilateral cataract surgery    . CARDIAC  CATHETERIZATION  08-06-13  . CORONARY ARTERY BYPASS GRAFT N/A 08/28/2013   Procedure: CORONARY ARTERY BYPASS GRAFTING (CABG) x2 using right greater saphenous vein and left internal mammary artery. ;  Surgeon: Grace Isaac, MD;  Location: [place];  Service: Open Heart Surgery;  Laterality: N/A;  . HIP ARTHROPLASTY Right 12/09/2013   Procedure: ARTHROPLASTY BIPOLAR HIP CEMENTED;  Surgeon: Kerin Salen, MD;  Location: [place] [facility];  Service: Orthopedics;  Laterality: Right;  . INTRAOPERATIVE TRANSESOPHAGEAL ECHOCARDIOGRAM N/A 08/28/2013   Procedure: INTRAOPERATIVE TRANSESOPHAGEAL ECHOCARDIOGRAM;  Surgeon: Grace Isaac, MD;  Location: [place];  Service: Open Heart Surgery;  Laterality: N/A;  . LEFT AND RIGHT HEART CATHETERIZATION WITH CORONARY ANGIOGRAM N/A 08/16/2013   Procedure: LEFT AND RIGHT HEART CATHETERIZATION WITH CORONARY ANGIOGRAM;  Surgeon: [name], MD;  [facility];  Service: Cardiovascular;  Laterality: N/A;  . MANDIBLE SURGERY    . TCS    . TONSILLECTOMY    . trigger thumb    . TUBAL LIGATION      Current Medications: Outpatient Medications Prior to Visit  Medication Sig Dispense Refill  . acetaminophen (TYLENOL) 325 MG tablet Take 650 mg by mouth every 6 (six) hours as needed.    . furosemide (LASIX) 20 MG tablet Take 20 mg by mouth daily.    Marland Kitchen levothyroxine (SYNTHROID, LEVOTHROID) 50 MCG tablet Take 1 tablet (50 mcg total) by mouth daily before breakfast. 30 tablet 11  . MULTIPLE VITAMIN PO Take 1 tablet by mouth daily.    . polyethylene glycol (MIRALAX / GLYCOLAX) packet Take 17 g by mouth daily.    . Rivaroxaban (XARELTO) 15 MG TABS tablet TAKE ONE TABLET BY MOUTH ONCE DAILY WITH SUPPER 90 tablet 0  . sertraline (ZOLOFT) 50 MG tablet Take 25 mg by mouth daily.    Marland Kitchen amiodarone (PACERONE) 100 MG tablet Take 2 tablets (200 mg total) by mouth 2 (two) times daily. (Patient not taking: Reported on 07/13/2017)    . amLODipine (NORVASC) 5 MG tablet Take 5 mg by mouth every Monday, Wednesday, and Friday.    . Cholecalciferol 1000 UNITS tablet Take 1,000 Units by mouth daily.    Marland Kitchen MELATONIN ER PO Take by mouth.     No facility-administered medications prior to visit.      Allergies:   Amoxicillin; Codeine; Fosamax [alendronate sodium]; Gluten meal; Hctz [hydrochlorothiazide]; and Tetanus toxoids   Social History   Social History  . Marital status: Widowed    Spouse name: N/A  . Number of children: N/A  . Years of education: N/A   Social History Main Topics  . Smoking status: Never Smoker  . Smokeless tobacco: Never Used  . Alcohol use No  . Drug use: No    . Sexual activity: Yes    Birth control/ protection: Surgical   Other Topics Concern  . None   Social History Narrative  . None     Family History:  The patient's family history includes AAA (abdominal aortic aneurysm) in her brother; CAD in her father; CVA in her mother; Colon cancer in her father; Heart attack in her brother, father, and mother; Peripheral vascular disease in her brother.   ROS:   Please see the history of present illness.    Recurring cough, unexplained weight loss, decreased appetite, leg pain bilaterally, more noticeable at rest. She feels that she intermittently has swelling in her face. Constipation and nocturia have been calm problems. She also has difficulty with her  urinary stream.  All other systems reviewed and are negative.   PHYSICAL EXAM:   VS:  BP (!) 154/62 (BP Location: Left Arm)   Pulse (!) 58   Ht 5' (1.524 m)   Wt 107 lb 6.4 oz (48.7 kg)   BMI 20.98 kg/m    GEN: Well nourished, well developed, in no acute distress  HEENT: normal  Neck: no JVD, carotid bruits, or masses Cardiac: RRR; There is a soft left parasternal systolic murmur. No rubs, or gallops,no edema  Respiratory:  clear to auscultation bilaterally, normal work of breathing GI: soft, nontender, nondistended, + BS MS: no deformity or atrophy  Skin: warm and dry, no rash Neuro:  Alert and Oriented x 3, Strength and sensation are intact Psych: euthymic mood, full affect  Wt Readings from Last 3 Encounters:  07/13/17 107 lb 6.4 oz (48.7 kg)  07/01/17 103 lb 4.8 oz (46.9 kg)  11/17/16 105 lb 8 oz (47.9 kg)      Studies/Labs Reviewed:   EKG:  EKG  Sinus bradycardia at 58 bpm, nonspecific ST abnormality, right bundle branch block, first-degree AV block (PR 244 ms). Left axis deviation. When compared to the prior tracing, no significant change has occurred.  Recent Labs: No results found for requested labs within last 8760 hours.   Lipid Panel    Component Value Date/Time    CHOL 173 10/19/2014 0315   TRIG 85 10/19/2014 0315   HDL 54 10/19/2014 0315   CHOLHDL 3.2 10/19/2014 0315   VLDL 17 10/19/2014 0315   LDLCALC 102 (H) 10/19/2014 0315    Additional studies/ records that were reviewed today include:  No recent cardiac imaging studies or functional studies have been done.    ASSESSMENT:    1. Paroxysmal atrial fibrillation (HCC)   2. Chronic anticoagulation CHADS2VASC2 score 7   3. Coronary artery disease involving coronary bypass graft of native heart with angina pectoris ([place])   4. CKD (chronic kidney disease), stage III   5. Sinoatrial node dysfunction (HCC)   6. Essential hypertension, benign   7. On amiodarone therapy   8. Frailty      PLAN:  In order of problems listed above:  1. Maintained on 100 mg of amiodarone per day. However, I am unable to confirm that this is in fact how she takes the medication. I discussed the importance of the correct dose being taken. The daughter will assure that we get a call back concerning her tablet strength. She understands that the patient should be taking 100 mg once per day. Needs to have a TSH and hepatic panel. We will determine if Dr. Carlyle Lipa most recent laboratory data contains any labs that will be useful in following for amiodarone toxicity. 2. She is on anticoagulation with lower dose Xarelto 15 mg daily. Chads past score is greater than 2. 3. Chronic coronary artery disease, prior bypass grafting in 2014, and multiple varieties of chest discomfort. The chest discomfort does not sound ischemic. Conservative management and observation at this time. 4. Not reassessed. Last creatinine available in the current health chart is very remote. Most recent blood work is been done by Dr. Felipa Eth. He did bloodwork today. We will attempt to get copies of that data. She is on amiodarone and needs to have a TSH and hepatic panel. 5. Sinus bradycardia. Patient is on amiodarone. No adjustments made at this  time. If I find that she is on 200 mg daily the dose would be decreased to the preferred  100 mg. She needs six-month clinical follow-up 6. Blood pressure is mildly elevated and at her age we will not aggressively lower below 729 mmHg systolic. She states that blood pressures at home can arrange in the low 1 teens. 7. For final amiodarone dose. TSH and hepatic panel was hopefully drawn today at Dr. Felipa Eth. If not we will perform. She will return in 6 months with TSH and hepatic panel at that time as well.  Clinical observation. Await clarification on current amiodarone dose. Return if all this visit in 6 months.    Medication Adjustments/Labs and Tests Ordered: Current medicines are reviewed at length with the patient today.  Concerns regarding medicines are outlined above.  Medication changes, Labs and Tests ordered today are listed in the Patient Instructions below. Patient Instructions  Medication Instructions:  Your physician recommends that you continue on your current medications as directed. Please refer to the Current Medication list given to you today.  You should only be taking amiodarone 100 mg daily. Please call our office back to verify.  Labwork: None ordered We will request labs from Dr. Carlyle Lipa office  Testing/Procedures: None ordered  Follow-Up: Your physician wants you to follow-up in: 6 months with Dr. [name]. You will receive a reminder letter in the mail two months in advance. If you don't receive a letter, please call our office to schedule the follow-up appointment.   Any Other Special Instructions Will Be Listed Below (If Applicable).     If you need a refill on your cardiac medications before your next appointment, please call your pharmacy.      Signed, [name], MD  07/13/2017 5:51 PM    [facility] [place], [place], [place]  [address] Phone: [phone]; Fax: [phone]

## 2017-07-13 NOTE — Patient Instructions (Signed)
Medication Instructions:  Your physician recommends that you continue on your current medications as directed. Please refer to the Current Medication list given to you today.  You should only be taking amiodarone 100 mg daily. Please call our office back to verify.  Labwork: None ordered We will request labs from Dr. Carlyle Lipa office  Testing/Procedures: None ordered  Follow-Up: Your physician wants you to follow-up in: 6 months with Dr. [name]. You will receive a reminder letter in the mail two months in advance. If you don't receive a letter, please call our office to schedule the follow-up appointment.   Any Other Special Instructions Will Be Listed Below (If Applicable).     If you need a refill on your cardiac medications before your next appointment, please call your pharmacy.

## 2017-07-14 ENCOUNTER — Telehealth: Payer: Self-pay | Admitting: Interventional Cardiology

## 2017-07-14 NOTE — Telephone Encounter (Signed)
Spoke with pt and she states that she has been taking Amiodarone 236m QD.  She says the bottle she has still says 2068mBID.  Pt aware that she is suppose to be on 1006mD and will start taking just half a tablet.  Will route to Dr. SmiTamala Julian make him aware.

## 2017-07-14 NOTE — Telephone Encounter (Signed)
Man this is scary. Was she on 200 mg BID?

## 2017-07-14 NOTE — Telephone Encounter (Signed)
No, she was only taking 273m QD.

## 2017-08-25 DIAGNOSIS — Z79899 Other long term (current) drug therapy: Secondary | ICD-10-CM | POA: Diagnosis not present

## 2017-08-25 DIAGNOSIS — K900 Celiac disease: Secondary | ICD-10-CM | POA: Diagnosis not present

## 2017-08-25 DIAGNOSIS — I10 Essential (primary) hypertension: Secondary | ICD-10-CM | POA: Diagnosis not present

## 2017-08-25 DIAGNOSIS — R600 Localized edema: Secondary | ICD-10-CM | POA: Diagnosis not present

## 2017-08-25 DIAGNOSIS — I480 Paroxysmal atrial fibrillation: Secondary | ICD-10-CM | POA: Diagnosis not present

## 2017-08-29 ENCOUNTER — Other Ambulatory Visit: Payer: Self-pay | Admitting: Geriatric Medicine

## 2017-08-29 DIAGNOSIS — R600 Localized edema: Secondary | ICD-10-CM

## 2017-08-31 ENCOUNTER — Other Ambulatory Visit: Payer: Self-pay

## 2017-08-31 ENCOUNTER — Ambulatory Visit (HOSPITAL_COMMUNITY): Payer: Medicare Other | Attending: Cardiovascular Disease

## 2017-08-31 DIAGNOSIS — I129 Hypertensive chronic kidney disease with stage 1 through stage 4 chronic kidney disease, or unspecified chronic kidney disease: Secondary | ICD-10-CM | POA: Insufficient documentation

## 2017-08-31 DIAGNOSIS — N189 Chronic kidney disease, unspecified: Secondary | ICD-10-CM | POA: Diagnosis not present

## 2017-08-31 DIAGNOSIS — I2510 Atherosclerotic heart disease of native coronary artery without angina pectoris: Secondary | ICD-10-CM | POA: Diagnosis not present

## 2017-08-31 DIAGNOSIS — R600 Localized edema: Secondary | ICD-10-CM | POA: Insufficient documentation

## 2017-08-31 DIAGNOSIS — I083 Combined rheumatic disorders of mitral, aortic and tricuspid valves: Secondary | ICD-10-CM | POA: Insufficient documentation

## 2017-09-01 ENCOUNTER — Other Ambulatory Visit (HOSPITAL_COMMUNITY): Payer: Medicare Other

## 2017-09-07 DIAGNOSIS — N312 Flaccid neuropathic bladder, not elsewhere classified: Secondary | ICD-10-CM | POA: Diagnosis not present

## 2017-09-07 DIAGNOSIS — N8110 Cystocele, unspecified: Secondary | ICD-10-CM | POA: Diagnosis not present

## 2017-09-07 DIAGNOSIS — R3914 Feeling of incomplete bladder emptying: Secondary | ICD-10-CM | POA: Diagnosis not present

## 2017-09-21 DIAGNOSIS — I10 Essential (primary) hypertension: Secondary | ICD-10-CM | POA: Diagnosis not present

## 2017-09-21 DIAGNOSIS — M810 Age-related osteoporosis without current pathological fracture: Secondary | ICD-10-CM | POA: Diagnosis not present

## 2017-09-21 DIAGNOSIS — K900 Celiac disease: Secondary | ICD-10-CM | POA: Diagnosis not present

## 2017-09-21 DIAGNOSIS — F5101 Primary insomnia: Secondary | ICD-10-CM | POA: Diagnosis not present

## 2017-10-27 ENCOUNTER — Other Ambulatory Visit: Payer: Self-pay

## 2017-10-27 ENCOUNTER — Emergency Department (INDEPENDENT_AMBULATORY_CARE_PROVIDER_SITE_OTHER): Payer: Medicare Other

## 2017-10-27 ENCOUNTER — Emergency Department (INDEPENDENT_AMBULATORY_CARE_PROVIDER_SITE_OTHER)
Admission: EM | Admit: 2017-10-27 | Discharge: 2017-10-27 | Disposition: A | Discharge status: Home / Self Care | Payer: Medicare Other | Source: Home / Self Care | Attending: Family Medicine | Admitting: Family Medicine

## 2017-10-27 DIAGNOSIS — M25562 Pain in left knee: Secondary | ICD-10-CM | POA: Diagnosis not present

## 2017-10-27 DIAGNOSIS — W010XXA Fall on same level from slipping, tripping and stumbling without subsequent striking against object, initial encounter: Secondary | ICD-10-CM

## 2017-10-27 DIAGNOSIS — S8012XA Contusion of left lower leg, initial encounter: Secondary | ICD-10-CM

## 2017-10-27 DIAGNOSIS — S8992XA Unspecified injury of left lower leg, initial encounter: Secondary | ICD-10-CM | POA: Diagnosis not present

## 2017-10-27 DIAGNOSIS — S8002XA Contusion of left knee, initial encounter: Secondary | ICD-10-CM | POA: Diagnosis not present

## 2017-10-27 DIAGNOSIS — M7989 Other specified soft tissue disorders: Secondary | ICD-10-CM | POA: Diagnosis not present

## 2017-10-27 DIAGNOSIS — M25462 Effusion, left knee: Secondary | ICD-10-CM

## 2017-10-27 NOTE — ED Triage Notes (Signed)
Fell yesterday about 5 pm. Possibly against a table, pt is unsure.  Fell and hit both knees, slight swelling and bruising noted.  Took 1051m of tylenol this am.

## 2017-10-27 NOTE — ED Provider Notes (Signed)
[name]    CSN: [id number] Arrival date & time: 10/27/17  1220     History   Chief Complaint Chief Complaint  Patient presents with  . Fall    HPI Maria [name] is a 84 y.o. female.   HPI  Maria [name] is a 84 y.o. female presenting to UC accompanied by her daughter with c/o Left knee pain, swelling and bruising after a fall yesterday at nursing facility where she lives. Pt states she tripped on an electrical cord in a sunroom and believes she hit both knees on the edge of a side table.  She is also c/o mild bilateral elbow soreness but Left knee is most bothersome for her.  She denies immediate pain and even went out with friends last night but when she got home, pain was moderate to severe, making it difficult for her to sleep.  She is on Xarelto.  Denies hitting her head.  She has a cane with her in exam room, she notes she always uses her cane when she is out and about.    Past Medical History:  Diagnosis Date  . Anemia   . Atonic bladder 12/11/2013  . Atrial fibrillation ([place])   . Carotid artery occlusion    right carotid stenosis 40-60%, 4/08, neg for stenosis repeat study 11/11  . Celiac disease   . Chronic anticoagulation CHADS2VASC2 score 7 10/18/2014  . Chronic diastolic CHF (congestive heart failure) (HCC)    takes Lasix daily  . CKD (chronic kidney disease), stage III ([place])   . Coronary artery disease    a.  08/2013 (LIMA-LAD, rSVG to LCX and Lt atrial clip),  . Depression   . DVT (deep venous thrombosis) ([place]) 09/2013  . Gastric ulcer    6-19month ago  . GERD (gastroesophageal reflux disease)    takes Omeprazole daily  . GI bleeding   . Hemorrhoids   . Hot flashes    takes ZOloft 3 times a week  . Hypertension   . Hypothyroidism    takes Synthroid daily as result of AMiodarone  . Insomnia   . Macular degeneration   . Mild aortic insufficiency    a. Echo 10/2013: mild AI.  .Marland KitchenModerate mitral regurgitation by prior echocardiogram      a. Echo 10/2013.  . Moderate obstructive sleep apnea    uses CPAP;sleep study about 4-552monthago  . MVP (mitral valve prolapse)   . Osteoporosis   . PAF (paroxysmal atrial fibrillation) (HCD[place]  . Peripheral edema    left  . Presbycusis   . Pulmonary hypertension ([place]CM[place]  . RLS (restless legs syndrome)   . Thyroid nodule    29m20mno change 11/11  . Urethral stricture   . Urinary anastomotic stricture   . Urinary retention   . Vitamin D deficiency     Patient Active Problem List   Diagnosis Date Noted  . On amiodarone therapy 07/13/2017  . Frailty 07/13/2017  . CKD (chronic kidney disease), stage III (HCCModena . Hypothyroidism 10/18/2014  . Chronic anticoagulation CHADS2VASC2 score 7 10/18/2014  . Right leg pain 07/22/2014  . Gout of foot 04/10/2014  . Atonic bladder 12/11/2013  . Closed right hip fracture (HCCArbutus2/13/2015  . Protein-calorie malnutrition, severe (HCCChatsworth2/08/2013  . DVT (deep venous thrombosis) (HCCRock Island2/07/2013  . Coronary artery disease involving coronary bypass graft of native heart with angina pectoris (HCCWebb1/19/2014  . Mitral regurgitation 08/07/2013  . Pulmonary hypertension, moderate to  severe ([place]) 08/07/2013  . Essential hypertension, benign 08/02/2013  . Nontoxic uninodular goiter 08/02/2013  . Unspecified hereditary and idiopathic peripheral neuropathy 08/02/2013  . Carotid disease, bilateral ([place]) 08/02/2013  . Unspecified vitamin D deficiency 08/02/2013  . Depressive disorder, not elsewhere classified 08/02/2013  . Paroxysmal atrial fibrillation ([place]) 08/02/2013  . Trifascicular block 08/02/2013  . Gastric ulcer, unspecified as acute or chronic, without mention of hemorrhage, perforation, or obstruction 08/02/2013  . Sinoatrial node dysfunction ([place]) 08/02/2013    Past Surgical History:  Procedure Laterality Date  . APPENDECTOMY    . bilateral cataract surgery    . CARDIAC CATHETERIZATION  08-06-13  . CORONARY ARTERY BYPASS GRAFT N/A  08/28/2013   Procedure: CORONARY ARTERY BYPASS GRAFTING (CABG) x2 using right greater saphenous vein and left internal mammary artery. ;  Surgeon: Grace Isaac, MD;  Location: [place];  Service: Open Heart Surgery;  Laterality: N/A;  . HIP ARTHROPLASTY Right 12/09/2013   Procedure: ARTHROPLASTY BIPOLAR HIP CEMENTED;  Surgeon: Kerin Salen, MD;  Location: [facility];  Service: Orthopedics;  Laterality: Right;  . INTRAOPERATIVE TRANSESOPHAGEAL ECHOCARDIOGRAM N/A 08/28/2013   Procedure: INTRAOPERATIVE TRANSESOPHAGEAL ECHOCARDIOGRAM;  Surgeon: Grace Isaac, MD;  Location: [place] [facility];  Service: Open Heart Surgery;  Laterality: N/A;  . LEFT AND RIGHT HEART CATHETERIZATION WITH CORONARY ANGIOGRAM N/A 08/16/2013   Procedure: LEFT AND RIGHT HEART CATHETERIZATION WITH CORONARY ANGIOGRAM;  Surgeon: [name], MD;  Location: [facility] CATH LAB;  Service: Cardiovascular;  Laterality: N/A;  . MANDIBLE SURGERY    . TCS    . TONSILLECTOMY    . trigger thumb    . TUBAL LIGATION      OB History    No data available       Home Medications    Prior to Admission medications   Medication Sig Start Date End Date Taking? Authorizing Provider  acetaminophen (TYLENOL) 325 MG tablet Take 650 mg by mouth every 6 (six) hours as needed.    [provider]  amiodarone (PACERONE) 100 MG tablet Take 100 mg by mouth daily. 08/14/15   [provider]  amLODipine (NORVASC) 2.5 MG tablet Take 2.5 mg by mouth 3 (three) times a week. (Monday, Wednesday, and Friday) 05/02/17   [provider]  furosemide (LASIX) 20 MG tablet Take 20 mg by mouth daily.    [provider]  levothyroxine (SYNTHROID, LEVOTHROID) 50 MCG tablet Take 1 tablet (50 mcg total) by mouth daily before breakfast. 01/03/14   Angiulli, Lavon Paganini, PA-C  Melatonin CR 3 MG TBCR Take 3 mg by mouth at bedtime.    [provider]  MULTIPLE VITAMIN PO Take 1 tablet by mouth daily.    [provider]  polyethylene  glycol (MIRALAX / GLYCOLAX) packet Take 17 g by mouth daily.    [provider]  Rivaroxaban (XARELTO) 15 MG TABS tablet TAKE ONE TABLET BY MOUTH ONCE DAILY WITH SUPPER 05/16/17   Belva Crome, MD  sertraline (ZOLOFT) 50 MG tablet Take 25 mg by mouth daily. 12/08/15   [provider]    Family History Family History  Problem Relation Age of Onset  . Heart attack Mother   . CVA Mother   . CAD Father   . Colon cancer Father   . Heart attack Father   . Heart attack Brother   . Peripheral vascular disease Brother   . AAA (abdominal aortic aneurysm) Brother     Social History Social History  Tobacco Use  . Smoking status: Never Smoker  . Smokeless tobacco: Never Used  Substance Use Topics  . Alcohol use: No  . Drug use: No     Allergies   Amoxicillin; Codeine; Fosamax [alendronate sodium]; Gluten meal; Hctz [hydrochlorothiazide]; and Tetanus toxoids   Review of Systems Review of Systems  Musculoskeletal: Positive for arthralgias, joint swelling and myalgias. Negative for back pain, gait problem, neck pain and neck stiffness.  Skin: Positive for color change. Negative for wound.  Neurological: Negative for dizziness, syncope, weakness, light-headedness, numbness and headaches.     Physical Exam Triage Vital Signs ED Triage Vitals  Enc Vitals Group     BP 10/27/17 1325 (!) 145/69     Pulse Rate 10/27/17 1325 60     Resp --      Temp 10/27/17 1325 98.7 F (37.1 C)     Temp Source 10/27/17 1325 Oral     SpO2 10/27/17 1325 95 %     Weight 10/27/17 1326 104 lb (47.2 kg)     Height 10/27/17 1326 5' (1.524 m)     Head Circumference --      Peak Flow --      Pain Score 10/27/17 1326 5     Pain Loc --      Pain Edu? --      Excl. in [place]? --    No data found.  Updated Vital Signs BP (!) 145/69 (BP Location: Right Arm)   Pulse 60   Temp 98.7 F (37.1 C) (Oral)   Ht 5' (1.524 m)   Wt 104 lb (47.2 kg)   SpO2 95%   BMI 20.31 kg/m   Visual  Acuity Right Eye Distance:   Left Eye Distance:   Bilateral Distance:    Right Eye Near:   Left Eye Near:    Bilateral Near:     Physical Exam  Constitutional: She is oriented to person, place, and time. She appears well-developed and well-nourished. No distress.  HENT:  Head: Normocephalic and atraumatic.  Eyes: EOM are normal.  Neck: Normal range of motion. Neck supple.  No midline bone tenderness, no crepitus or step-offs.   Cardiovascular: Normal rate.  Pulmonary/Chest: Effort normal.  Musculoskeletal: Normal range of motion. She exhibits edema and tenderness.  Left knee: mild edema. Tenderness to medial aspect. Full ROM w/o crepitus.  Calf is soft, non-tender. Right knee: minimal tenderness. Full ROM. Calf is soft, non-tender.  Bilateral elbows: full ROM, minimal tenderness.  No spinal tenderness.   Neurological: She is alert and oriented to person, place, and time.  Skin: Skin is warm and dry. Capillary refill takes less than 2 seconds. She is not diaphoretic.  Left knee: skin in tact. 3x4cm area of ecchymosis 1-2in below knee. Tender. Mild edema.  Right knee and both elbows: skin in tact. No ecchymosis or erythema.   Psychiatric: She has a normal mood and affect. Her behavior is normal.  Nursing note and vitals reviewed.    UC Treatments / Results  Labs (all labs ordered are listed, but only abnormal results are displayed) Labs Reviewed - No data to display  EKG  EKG Interpretation None       Radiology Dg Knee Complete 4 Views Left  Result Date: 10/27/2017 CLINICAL DATA:  Fall.  Pain and swelling EXAM: LEFT KNEE - COMPLETE 4+ VIEW COMPARISON:  None. FINDINGS: No evidence of fracture, dislocation, or joint effusion. No evidence of arthropathy or other focal bone abnormality. Soft tissues are unremarkable.  IMPRESSION: Negative. Electronically Signed   By: Franchot Gallo M.D.   On: 10/27/2017 13:54    Procedures Procedures (including critical care  time)  Medications Ordered in UC Medications - No data to display   Initial Impression / Assessment and Plan / UC Course  I have reviewed the triage vital signs and the nursing notes.  Pertinent labs & imaging results that were available during my care of the patient were reviewed by me and considered in my medical decision making (see chart for details).     Plain films: negative for fracture, dislocation or joint effusion Knee sleeve provided for comfort Encouraged f/u with PCP or Sports Medicine in 1-2 weeks if not improving Home care instructions provided   Final Clinical Impressions(s) / UC Diagnoses   Final diagnoses:  Fall from slip, trip, or stumble, initial encounter  Pain and swelling of left knee    ED Discharge Orders    None       Controlled Substance Prescriptions [place] Controlled Substance Registry consulted? Not Applicable   Noe Gens, PA-C 10/27/17 1600

## 2017-10-28 ENCOUNTER — Telehealth: Payer: Self-pay

## 2017-10-28 NOTE — Telephone Encounter (Signed)
Feeling better.  Able to sleep last night.  Will follow up as needed.

## 2017-11-30 DIAGNOSIS — I10 Essential (primary) hypertension: Secondary | ICD-10-CM | POA: Diagnosis not present

## 2017-11-30 DIAGNOSIS — K900 Celiac disease: Secondary | ICD-10-CM | POA: Diagnosis not present

## 2017-11-30 DIAGNOSIS — I480 Paroxysmal atrial fibrillation: Secondary | ICD-10-CM | POA: Diagnosis not present

## 2017-11-30 DIAGNOSIS — R51 Headache: Secondary | ICD-10-CM | POA: Diagnosis not present

## 2017-11-30 DIAGNOSIS — Z0000 Encounter for general adult medical examination without abnormal findings: Secondary | ICD-10-CM | POA: Diagnosis not present

## 2017-11-30 DIAGNOSIS — M546 Pain in thoracic spine: Secondary | ICD-10-CM | POA: Diagnosis not present

## 2017-11-30 DIAGNOSIS — G8929 Other chronic pain: Secondary | ICD-10-CM | POA: Diagnosis not present

## 2017-11-30 DIAGNOSIS — Z1389 Encounter for screening for other disorder: Secondary | ICD-10-CM | POA: Diagnosis not present

## 2017-12-16 DIAGNOSIS — M4125 Other idiopathic scoliosis, thoracolumbar region: Secondary | ICD-10-CM | POA: Diagnosis not present

## 2017-12-16 DIAGNOSIS — M545 Low back pain: Secondary | ICD-10-CM | POA: Diagnosis not present

## 2017-12-16 DIAGNOSIS — R293 Abnormal posture: Secondary | ICD-10-CM | POA: Diagnosis not present

## 2017-12-16 DIAGNOSIS — M546 Pain in thoracic spine: Secondary | ICD-10-CM | POA: Diagnosis not present

## 2017-12-16 DIAGNOSIS — M810 Age-related osteoporosis without current pathological fracture: Secondary | ICD-10-CM | POA: Diagnosis not present

## 2017-12-19 DIAGNOSIS — M545 Low back pain: Secondary | ICD-10-CM | POA: Diagnosis not present

## 2017-12-19 DIAGNOSIS — R293 Abnormal posture: Secondary | ICD-10-CM | POA: Diagnosis not present

## 2017-12-19 DIAGNOSIS — M4125 Other idiopathic scoliosis, thoracolumbar region: Secondary | ICD-10-CM | POA: Diagnosis not present

## 2017-12-19 DIAGNOSIS — M546 Pain in thoracic spine: Secondary | ICD-10-CM | POA: Diagnosis not present

## 2017-12-19 DIAGNOSIS — M810 Age-related osteoporosis without current pathological fracture: Secondary | ICD-10-CM | POA: Diagnosis not present

## 2017-12-22 DIAGNOSIS — M546 Pain in thoracic spine: Secondary | ICD-10-CM | POA: Diagnosis not present

## 2017-12-22 DIAGNOSIS — M545 Low back pain: Secondary | ICD-10-CM | POA: Diagnosis not present

## 2017-12-22 DIAGNOSIS — M4125 Other idiopathic scoliosis, thoracolumbar region: Secondary | ICD-10-CM | POA: Diagnosis not present

## 2017-12-22 DIAGNOSIS — M810 Age-related osteoporosis without current pathological fracture: Secondary | ICD-10-CM | POA: Diagnosis not present

## 2017-12-22 DIAGNOSIS — R293 Abnormal posture: Secondary | ICD-10-CM | POA: Diagnosis not present

## 2017-12-23 DIAGNOSIS — M545 Low back pain: Secondary | ICD-10-CM | POA: Diagnosis not present

## 2017-12-23 DIAGNOSIS — M4125 Other idiopathic scoliosis, thoracolumbar region: Secondary | ICD-10-CM | POA: Diagnosis not present

## 2017-12-23 DIAGNOSIS — R293 Abnormal posture: Secondary | ICD-10-CM | POA: Diagnosis not present

## 2017-12-23 DIAGNOSIS — M546 Pain in thoracic spine: Secondary | ICD-10-CM | POA: Diagnosis not present

## 2017-12-23 DIAGNOSIS — M810 Age-related osteoporosis without current pathological fracture: Secondary | ICD-10-CM | POA: Diagnosis not present

## 2017-12-27 DIAGNOSIS — M545 Low back pain: Secondary | ICD-10-CM | POA: Diagnosis not present

## 2017-12-27 DIAGNOSIS — M546 Pain in thoracic spine: Secondary | ICD-10-CM | POA: Diagnosis not present

## 2017-12-27 DIAGNOSIS — M810 Age-related osteoporosis without current pathological fracture: Secondary | ICD-10-CM | POA: Diagnosis not present

## 2017-12-27 DIAGNOSIS — M4125 Other idiopathic scoliosis, thoracolumbar region: Secondary | ICD-10-CM | POA: Diagnosis not present

## 2017-12-27 DIAGNOSIS — R293 Abnormal posture: Secondary | ICD-10-CM | POA: Diagnosis not present

## 2017-12-28 DIAGNOSIS — M4125 Other idiopathic scoliosis, thoracolumbar region: Secondary | ICD-10-CM | POA: Diagnosis not present

## 2017-12-28 DIAGNOSIS — R293 Abnormal posture: Secondary | ICD-10-CM | POA: Diagnosis not present

## 2017-12-28 DIAGNOSIS — M545 Low back pain: Secondary | ICD-10-CM | POA: Diagnosis not present

## 2017-12-28 DIAGNOSIS — M546 Pain in thoracic spine: Secondary | ICD-10-CM | POA: Diagnosis not present

## 2017-12-28 DIAGNOSIS — M810 Age-related osteoporosis without current pathological fracture: Secondary | ICD-10-CM | POA: Diagnosis not present

## 2017-12-30 DIAGNOSIS — M4125 Other idiopathic scoliosis, thoracolumbar region: Secondary | ICD-10-CM | POA: Diagnosis not present

## 2017-12-30 DIAGNOSIS — M546 Pain in thoracic spine: Secondary | ICD-10-CM | POA: Diagnosis not present

## 2017-12-30 DIAGNOSIS — R293 Abnormal posture: Secondary | ICD-10-CM | POA: Diagnosis not present

## 2017-12-30 DIAGNOSIS — M810 Age-related osteoporosis without current pathological fracture: Secondary | ICD-10-CM | POA: Diagnosis not present

## 2017-12-30 DIAGNOSIS — M545 Low back pain: Secondary | ICD-10-CM | POA: Diagnosis not present

## 2018-01-02 DIAGNOSIS — M810 Age-related osteoporosis without current pathological fracture: Secondary | ICD-10-CM | POA: Diagnosis not present

## 2018-01-02 DIAGNOSIS — M4125 Other idiopathic scoliosis, thoracolumbar region: Secondary | ICD-10-CM | POA: Diagnosis not present

## 2018-01-02 DIAGNOSIS — M545 Low back pain: Secondary | ICD-10-CM | POA: Diagnosis not present

## 2018-01-02 DIAGNOSIS — M546 Pain in thoracic spine: Secondary | ICD-10-CM | POA: Diagnosis not present

## 2018-01-02 DIAGNOSIS — R293 Abnormal posture: Secondary | ICD-10-CM | POA: Diagnosis not present

## 2018-01-04 DIAGNOSIS — M810 Age-related osteoporosis without current pathological fracture: Secondary | ICD-10-CM | POA: Diagnosis not present

## 2018-01-04 DIAGNOSIS — M546 Pain in thoracic spine: Secondary | ICD-10-CM | POA: Diagnosis not present

## 2018-01-04 DIAGNOSIS — M4125 Other idiopathic scoliosis, thoracolumbar region: Secondary | ICD-10-CM | POA: Diagnosis not present

## 2018-01-04 DIAGNOSIS — R293 Abnormal posture: Secondary | ICD-10-CM | POA: Diagnosis not present

## 2018-01-04 DIAGNOSIS — M545 Low back pain: Secondary | ICD-10-CM | POA: Diagnosis not present

## 2018-01-06 DIAGNOSIS — M4125 Other idiopathic scoliosis, thoracolumbar region: Secondary | ICD-10-CM | POA: Diagnosis not present

## 2018-01-06 DIAGNOSIS — R293 Abnormal posture: Secondary | ICD-10-CM | POA: Diagnosis not present

## 2018-01-06 DIAGNOSIS — M810 Age-related osteoporosis without current pathological fracture: Secondary | ICD-10-CM | POA: Diagnosis not present

## 2018-01-06 DIAGNOSIS — M545 Low back pain: Secondary | ICD-10-CM | POA: Diagnosis not present

## 2018-01-06 DIAGNOSIS — M546 Pain in thoracic spine: Secondary | ICD-10-CM | POA: Diagnosis not present

## 2018-01-09 DIAGNOSIS — M546 Pain in thoracic spine: Secondary | ICD-10-CM | POA: Diagnosis not present

## 2018-01-09 DIAGNOSIS — M4125 Other idiopathic scoliosis, thoracolumbar region: Secondary | ICD-10-CM | POA: Diagnosis not present

## 2018-01-09 DIAGNOSIS — M810 Age-related osteoporosis without current pathological fracture: Secondary | ICD-10-CM | POA: Diagnosis not present

## 2018-01-09 DIAGNOSIS — M545 Low back pain: Secondary | ICD-10-CM | POA: Diagnosis not present

## 2018-01-09 DIAGNOSIS — R293 Abnormal posture: Secondary | ICD-10-CM | POA: Diagnosis not present

## 2018-01-11 DIAGNOSIS — M546 Pain in thoracic spine: Secondary | ICD-10-CM | POA: Diagnosis not present

## 2018-01-11 DIAGNOSIS — R293 Abnormal posture: Secondary | ICD-10-CM | POA: Diagnosis not present

## 2018-01-11 DIAGNOSIS — M4125 Other idiopathic scoliosis, thoracolumbar region: Secondary | ICD-10-CM | POA: Diagnosis not present

## 2018-01-11 DIAGNOSIS — M810 Age-related osteoporosis without current pathological fracture: Secondary | ICD-10-CM | POA: Diagnosis not present

## 2018-01-11 DIAGNOSIS — M545 Low back pain: Secondary | ICD-10-CM | POA: Diagnosis not present

## 2018-01-16 DIAGNOSIS — M4125 Other idiopathic scoliosis, thoracolumbar region: Secondary | ICD-10-CM | POA: Diagnosis not present

## 2018-01-16 DIAGNOSIS — R293 Abnormal posture: Secondary | ICD-10-CM | POA: Diagnosis not present

## 2018-01-16 DIAGNOSIS — M546 Pain in thoracic spine: Secondary | ICD-10-CM | POA: Diagnosis not present

## 2018-01-16 DIAGNOSIS — M545 Low back pain: Secondary | ICD-10-CM | POA: Diagnosis not present

## 2018-01-16 DIAGNOSIS — M810 Age-related osteoporosis without current pathological fracture: Secondary | ICD-10-CM | POA: Diagnosis not present

## 2018-01-19 DIAGNOSIS — R293 Abnormal posture: Secondary | ICD-10-CM | POA: Diagnosis not present

## 2018-01-19 DIAGNOSIS — M4125 Other idiopathic scoliosis, thoracolumbar region: Secondary | ICD-10-CM | POA: Diagnosis not present

## 2018-01-19 DIAGNOSIS — M546 Pain in thoracic spine: Secondary | ICD-10-CM | POA: Diagnosis not present

## 2018-01-19 DIAGNOSIS — M545 Low back pain: Secondary | ICD-10-CM | POA: Diagnosis not present

## 2018-01-19 DIAGNOSIS — M810 Age-related osteoporosis without current pathological fracture: Secondary | ICD-10-CM | POA: Diagnosis not present

## 2018-01-20 DIAGNOSIS — M546 Pain in thoracic spine: Secondary | ICD-10-CM | POA: Diagnosis not present

## 2018-01-20 DIAGNOSIS — R293 Abnormal posture: Secondary | ICD-10-CM | POA: Diagnosis not present

## 2018-01-20 DIAGNOSIS — M4125 Other idiopathic scoliosis, thoracolumbar region: Secondary | ICD-10-CM | POA: Diagnosis not present

## 2018-01-20 DIAGNOSIS — M545 Low back pain: Secondary | ICD-10-CM | POA: Diagnosis not present

## 2018-01-20 DIAGNOSIS — M810 Age-related osteoporosis without current pathological fracture: Secondary | ICD-10-CM | POA: Diagnosis not present

## 2018-02-08 DIAGNOSIS — I10 Essential (primary) hypertension: Secondary | ICD-10-CM | POA: Diagnosis not present

## 2018-02-08 DIAGNOSIS — K9289 Other specified diseases of the digestive system: Secondary | ICD-10-CM | POA: Diagnosis not present

## 2018-02-08 DIAGNOSIS — K640 First degree hemorrhoids: Secondary | ICD-10-CM | POA: Diagnosis not present

## 2018-02-08 DIAGNOSIS — I700 Atherosclerosis of aorta: Secondary | ICD-10-CM | POA: Diagnosis not present

## 2018-02-08 DIAGNOSIS — M25551 Pain in right hip: Secondary | ICD-10-CM | POA: Diagnosis not present

## 2018-02-08 DIAGNOSIS — E222 Syndrome of inappropriate secretion of antidiuretic hormone: Secondary | ICD-10-CM | POA: Diagnosis not present

## 2018-02-08 DIAGNOSIS — Z79899 Other long term (current) drug therapy: Secondary | ICD-10-CM | POA: Diagnosis not present

## 2018-03-01 DIAGNOSIS — M25551 Pain in right hip: Secondary | ICD-10-CM | POA: Diagnosis not present

## 2018-03-29 DIAGNOSIS — M4120 Other idiopathic scoliosis, site unspecified: Secondary | ICD-10-CM | POA: Diagnosis not present

## 2018-04-19 DIAGNOSIS — I480 Paroxysmal atrial fibrillation: Secondary | ICD-10-CM | POA: Diagnosis not present

## 2018-04-19 DIAGNOSIS — Z79899 Other long term (current) drug therapy: Secondary | ICD-10-CM | POA: Diagnosis not present

## 2018-04-19 DIAGNOSIS — R202 Paresthesia of skin: Secondary | ICD-10-CM | POA: Diagnosis not present

## 2018-04-19 DIAGNOSIS — K900 Celiac disease: Secondary | ICD-10-CM | POA: Diagnosis not present

## 2018-04-19 DIAGNOSIS — D649 Anemia, unspecified: Secondary | ICD-10-CM | POA: Diagnosis not present

## 2018-04-19 DIAGNOSIS — R5383 Other fatigue: Secondary | ICD-10-CM | POA: Diagnosis not present

## 2018-04-19 DIAGNOSIS — I10 Essential (primary) hypertension: Secondary | ICD-10-CM | POA: Diagnosis not present

## 2018-05-08 DIAGNOSIS — G629 Polyneuropathy, unspecified: Secondary | ICD-10-CM | POA: Diagnosis not present

## 2018-05-15 DIAGNOSIS — I480 Paroxysmal atrial fibrillation: Secondary | ICD-10-CM | POA: Diagnosis not present

## 2018-05-15 DIAGNOSIS — E039 Hypothyroidism, unspecified: Secondary | ICD-10-CM | POA: Diagnosis not present

## 2018-05-15 DIAGNOSIS — J449 Chronic obstructive pulmonary disease, unspecified: Secondary | ICD-10-CM | POA: Diagnosis not present

## 2018-05-15 DIAGNOSIS — M810 Age-related osteoporosis without current pathological fracture: Secondary | ICD-10-CM | POA: Diagnosis not present

## 2018-05-15 DIAGNOSIS — I10 Essential (primary) hypertension: Secondary | ICD-10-CM | POA: Diagnosis not present

## 2018-05-17 DIAGNOSIS — H04123 Dry eye syndrome of bilateral lacrimal glands: Secondary | ICD-10-CM | POA: Diagnosis not present

## 2018-05-17 DIAGNOSIS — H1859 Other hereditary corneal dystrophies: Secondary | ICD-10-CM | POA: Diagnosis not present

## 2018-05-17 DIAGNOSIS — H52203 Unspecified astigmatism, bilateral: Secondary | ICD-10-CM | POA: Diagnosis not present

## 2018-05-17 DIAGNOSIS — H43813 Vitreous degeneration, bilateral: Secondary | ICD-10-CM | POA: Diagnosis not present

## 2018-06-14 NOTE — Progress Notes (Signed)
NEUROLOGY FOLLOW UP OFFICE NOTE  [name] [id number]  HISTORY OF PRESENT ILLNESS: Maria [name] is a 84 year old female with paroxysmal atrial fibrillation, Celiac disease, macular degeneration, hypertension, CKD stage 3, restless leg syndrome, and CAD s/p MI whom I have seen for dizziness and restless leg syndrome presents for neuropathy.  She is accompanied by her daughter who supplements history.  UPDATE: In September 2018, her ferritin level was 23. It was recommended to restart ferrous sulfate 358m daily to treat restless leg.  She is not taking iron due to constipation.  She follows up for concern of neuropathy.  Since 2015, she has had gradual progression of numbness, tingling and burning sensation in the feet.  She reports restless leg syndrome.  She had a NCV-EMG of the lowere xtremities which demonstrated chronic sensorimotor axonal and demyelinating polyneuropathy.  For restless leg at night, she takes yellow mustard.  Trazodone to help sleep. B12 from 11/17/16 was 365.  HISTORY: She has had several falls over the past several years.  Sometimes they are associated with dizziness, but not always.  Her first fall occurred after open-heart surgery, when she fell and hit her head after turning and stepping off the rug onto the tile floor.  She fractured her hip.  Another time, it occurred when she leaned forward to flush the toilet.  She has described episodes of dizziness.  Sometimes it is a spinning sensation lasting a couple of minutes with change in position, such as turning her head or laying down in bed.  She denies neck pain or weakness in the legs.  She doesn't always feel unsteady when walking but when she falls, it tends to be towards the right.  She has tried vestibular rehabilitation and physical therapy which provided brief relief.   She also has restless leg syndrome.  , ferritin level was low-normal at 19.8.  I recommended contacting her PCP about possible  supplementation. She is not sure if she was ever treated for low iron.   She tried sleeping with bar of soap under the sheets but it did not help.  While she endorses restless leg symptoms in both legs, she has particular uncomfortable "tight" and "prickly" sensation in the right leg (the same leg where she had a DVT, vein graft and fractured hip).  She has had several brain imaging.  MRI of brain with and without contrast from 01/16/16 was personally reviewed and revealed chronic small vessel ischemic changes in the cerebral white matter and cerebellum, but no acute changes.  CT of head from 03/02/16 and 06/16/16 were personally reviewed and were negative for acute changes.  She has atrial fibrillation and is on Xarelto.     Carotid doppler from 2011 showed no hemodynamically significant stenosis in the ICAs and antegrade flow in the vertebral arteries.  MRI lumbar spine from 02/01/17 demonstrated multilevel degenerative changes with neural foraminal stenosis.  PAST MEDICAL HISTORY: Past Medical History:  Diagnosis Date  . Anemia   . Atonic bladder 12/11/2013  . Atrial fibrillation ([facility]   . Carotid artery occlusion    right carotid stenosis 40-60%, 4/08, neg for stenosis repeat study 11/11  . Celiac disease   . Chronic anticoagulation CHADS2VASC2 score 7 10/18/2014  . Chronic diastolic CHF (congestive heart failure) (HCC)    takes Lasix daily  . CKD (chronic kidney disease), stage III ([facility]   . Coronary artery disease    a.  08/2013 (LIMA-LAD, rSVG to LCX and Lt atrial clip),  .  Depression   . DVT (deep venous thrombosis) ([place]) 09/2013  . Gastric ulcer    6-23month ago  . GERD (gastroesophageal reflux disease)    takes Omeprazole daily  . GI bleeding   . Hemorrhoids   . Hot flashes    takes ZOloft 3 times a week  . Hypertension   . Hypothyroidism    takes Synthroid daily as result of AMiodarone  . Insomnia   . Macular degeneration   . Mild aortic insufficiency    a. Echo  10/2013: mild AI.  .Marland KitchenModerate mitral regurgitation by prior echocardiogram    a. Echo 10/2013.  . Moderate obstructive sleep apnea    uses CPAP;sleep study about 4-531monthago  . MVP (mitral valve prolapse)   . Osteoporosis   . PAF (paroxysmal atrial fibrillation) ([facility]  . Peripheral edema    left  . Presbycusis   . Pulmonary hypertension ([facility]  . RLS (restless legs syndrome)   . Thyroid nodule    47m68mno change 11/11  . Urethral stricture   . Urinary anastomotic stricture   . Urinary retention   . Vitamin D deficiency     MEDICATIONS: Current Outpatient Medications on File Prior to Visit  Medication Sig Dispense Refill  . acetaminophen (TYLENOL) 325 MG tablet Take 650 mg by mouth every 6 (six) hours as needed.    . aMarland Kitcheniodarone (PACERONE) 100 MG tablet Take 100 mg by mouth daily.    . aMarland KitchenLODipine (NORVASC) 2.5 MG tablet Take 2.5 mg by mouth 3 (three) times a week. (Monday, Wednesday, and Friday)    . furosemide (LASIX) 20 MG tablet Take 20 mg by mouth daily.    . lMarland Kitchenvothyroxine (SYNTHROID, LEVOTHROID) 50 MCG tablet Take 1 tablet (50 mcg total) by mouth daily before breakfast. 30 tablet 11  . Melatonin CR 3 MG TBCR Take 3 mg by mouth at bedtime.    . MULTIPLE VITAMIN PO Take 1 tablet by mouth daily.    . polyethylene glycol (MIRALAX / GLYCOLAX) packet Take 17 g by mouth daily.    . Rivaroxaban (XARELTO) 15 MG TABS tablet TAKE ONE TABLET BY MOUTH ONCE DAILY WITH SUPPER 90 tablet 0  . sertraline (ZOLOFT) 50 MG tablet Take 25 mg by mouth daily.     No current facility-administered medications on file prior to visit.     ALLERGIES: Allergies  Allergen Reactions  . Amoxicillin Other (See Comments)    Nausea, dizziness  . Codeine Nausea And Vomiting  . Fosamax [Alendronate Sodium] Other (See Comments)    Jaw pain  . Gluten Meal Other (See Comments)    Celiac Disease  . Hctz [Hydrochlorothiazide] Other (See Comments)    Hyponatremia, GI upset  . Tetanus Toxoids Other (See  Comments)    Bad local reaction    FAMILY HISTORY: Family History  Problem Relation Age of Onset  . Heart attack Mother   . CVA Mother   . CAD Father   . Colon cancer Father   . Heart attack Father   . Heart attack Brother   . Peripheral vascular disease Brother   . AAA (abdominal aortic aneurysm) Brother     SOCIAL HISTORY: Social History   Socioeconomic History  . Marital status: Widowed    Spouse name: Not on file  . Number of children: Not on file  . Years of education: Not on file  . Highest education level: Not on file  Occupational History  . Not on file  Social  Needs  . Financial resource strain: Not on file  . Food insecurity:    Worry: Not on file    Inability: Not on file  . Transportation needs:    Medical: Not on file    Non-medical: Not on file  Tobacco Use  . Smoking status: Never Smoker  . Smokeless tobacco: Never Used  Substance and Sexual Activity  . Alcohol use: No  . Drug use: No  . Sexual activity: Yes    Birth control/protection: Surgical  Lifestyle  . Physical activity:    Days per week: Not on file    Minutes per session: Not on file  . Stress: Not on file  Relationships  . Social connections:    Talks on phone: Not on file    Gets together: Not on file    Attends religious service: Not on file    Active member of club or organization: Not on file    Attends meetings of clubs or organizations: Not on file    Relationship status: Not on file  . Intimate partner violence:    Fear of current or ex partner: Not on file    Emotionally abused: Not on file    Physically abused: Not on file    Forced sexual activity: Not on file  Other Topics Concern  . Not on file  Social History Narrative  . Not on file    REVIEW OF SYSTEMS: Constitutional: No fevers, chills, or sweats, no generalized fatigue, change in appetite Eyes: No visual changes, double vision, eye pain Ear, nose and throat: No hearing loss, ear pain, nasal congestion,  sore throat Cardiovascular: No chest pain, palpitations Respiratory:  No shortness of breath at rest or with exertion, wheezes GastrointestinaI: No nausea, vomiting, diarrhea, abdominal pain, fecal incontinence Genitourinary:  No dysuria, urinary retention or frequency Musculoskeletal:  No neck pain, back pain Integumentary: No rash, pruritus, skin lesions Neurological: as above Psychiatric: No depression, insomnia, anxiety Endocrine: No palpitations, fatigue, diaphoresis, mood swings, change in appetite, change in weight, increased thirst Hematologic/Lymphatic:  No purpura, petechiae. Allergic/Immunologic: no itchy/runny eyes, nasal congestion, recent allergic reactions, rashes  PHYSICAL EXAM: Blood pressure (!) 110/52, pulse 63, height 5' 1"  (1.549 m), weight 104 lb (47.2 kg), SpO2 98 %. General: No acute distress.  Patient appears well-groomed.  normal body habitus. Head:  Normocephalic/atraumatic Eyes:  Fundi examined but not visualized Neck: supple, no paraspinal tenderness, full range of motion Heart:  Regular rate and rhythm Lungs:  Clear to auscultation bilaterally Back: No paraspinal tenderness Neurological Exam: alert and oriented to person, place, and time. Attention span and concentration intact, recent and remote memory intact, fund of knowledge intact.  Speech fluent and not dysarthric, language intact.  CN II-XII intact. Bulk and tone normal, muscle strength 5/5 throughout.  Sensation to pinprick reduced up to below knees.  Vibration sensation slightly reduced in feet.  Deep tendon reflexes 1+ throughout, toes downgoing.  Finger to nose and heel to shin testing intact.  Gait mildly wide-based and cautious.  Romberg with sway  IMPRESSION: Polyneuropathy.  Idiopathic at this time.  Celiac disease may be contributing.   Restless leg syndrome.  Has low ferritin but cannot tolerate supplements.  May be aggravated by neuropathy.  PLAN: 1.  Start gabapentin 159m at bedtime.  If  pain and restless leg not improved in 1 to 2 weeks, contact me and we can increase dose. 2.  We will check ANA, sed rate, SPEP/IFE, TSH 3.  Follow up in  4 months.  30 minutes spent face to face with patient, over 50% spent discussing management and diagnosis.  Metta Clines, DO  CC:  [name], MD

## 2018-06-15 ENCOUNTER — Encounter: Payer: Self-pay | Admitting: Neurology

## 2018-06-15 ENCOUNTER — Ambulatory Visit (INDEPENDENT_AMBULATORY_CARE_PROVIDER_SITE_OTHER): Payer: Medicare Other | Admitting: Neurology

## 2018-06-15 ENCOUNTER — Other Ambulatory Visit (INDEPENDENT_AMBULATORY_CARE_PROVIDER_SITE_OTHER): Payer: Medicare Other

## 2018-06-15 VITALS — BP 110/52 | HR 63 | Ht 61.0 in | Wt 104.0 lb

## 2018-06-15 DIAGNOSIS — R2681 Unsteadiness on feet: Secondary | ICD-10-CM | POA: Diagnosis not present

## 2018-06-15 DIAGNOSIS — G2581 Restless legs syndrome: Secondary | ICD-10-CM | POA: Diagnosis not present

## 2018-06-15 DIAGNOSIS — R6889 Other general symptoms and signs: Secondary | ICD-10-CM

## 2018-06-15 DIAGNOSIS — G609 Hereditary and idiopathic neuropathy, unspecified: Secondary | ICD-10-CM | POA: Diagnosis not present

## 2018-06-15 LAB — SEDIMENTATION RATE: Sed Rate: 50 mm/hr — ABNORMAL HIGH (ref 0–30)

## 2018-06-15 NOTE — Patient Instructions (Addendum)
1.  Start gabapentin 126m at bedtime.  If pain and restless leg not improved in 1 to 2 weeks, contact me and we can increase dose. 2.  We will check ANA, sed rate, SPEP/IFE, TSH 3.  Follow up in 4 months.  Your provider has requested that you have labwork completed today. [id number] on the second floor of this building before leaving the office today. You do not need to check in. If you are not called within 15 minutes please check with the front desk.

## 2018-06-19 LAB — ANA: Anti Nuclear Antibody(ANA): NEGATIVE

## 2018-06-19 LAB — PROTEIN ELECTROPHORESIS, SERUM
ALPHA 2: 0.8 g/dL (ref 0.5–0.9)
Albumin ELP: 4 g/dL (ref 3.8–4.8)
Alpha 1: 0.3 g/dL (ref 0.2–0.3)
BETA 2: 0.2 g/dL (ref 0.2–0.5)
Beta Globulin: 0.5 g/dL (ref 0.4–0.6)
GAMMA GLOBULIN: 1.6 g/dL (ref 0.8–1.7)
Total Protein: 7.5 g/dL (ref 6.1–8.1)

## 2018-06-19 LAB — IMMUNOFIXATION ELECTROPHORESIS
IGM, SERUM: 250 mg/dL (ref 50–300)
IMMUNOFIX ELECTR INT: NOT DETECTED
IgG (Immunoglobin G), Serum: 1750 mg/dL — ABNORMAL HIGH (ref 600–1540)
Immunoglobulin A: <5 mg/dL — ABNORMAL LOW (ref 20–320)

## 2018-06-20 ENCOUNTER — Telehealth: Payer: Self-pay

## 2018-06-20 NOTE — Telephone Encounter (Signed)
Called Pt to discuss lab results, LMOVM for Pt to rtrn call

## 2018-06-20 NOTE — Telephone Encounter (Signed)
-----   Message from Pieter Partridge, DO sent at 06/19/2018  1:34 PM EDT ----- No specific cause of neuropathy found on labs.  Sed rate mildly elevated but not clinically significant.

## 2018-06-20 NOTE — Telephone Encounter (Signed)
Pt rtrnd my call, advised her of lab results

## 2018-06-22 ENCOUNTER — Telehealth: Payer: Self-pay | Admitting: Neurology

## 2018-06-22 DIAGNOSIS — G2581 Restless legs syndrome: Secondary | ICD-10-CM

## 2018-06-22 NOTE — Telephone Encounter (Signed)
Patient called and needs to see if Dr. [name] could call in Gabapentin for her at [place] [facility] and a 30 day supply. She was unsure of the dosage. She also asked could it be Delivered to her address? Thanks

## 2018-06-23 MED ORDER — GABAPENTIN 100 MG PO CAPS: 100.0000 mg | ORAL_CAPSULE | Freq: Every day | ORAL | 5 refills | Status: DC

## 2018-07-04 DIAGNOSIS — Z23 Encounter for immunization: Secondary | ICD-10-CM | POA: Diagnosis not present

## 2018-07-04 DIAGNOSIS — G603 Idiopathic progressive neuropathy: Secondary | ICD-10-CM | POA: Diagnosis not present

## 2018-07-04 DIAGNOSIS — I10 Essential (primary) hypertension: Secondary | ICD-10-CM | POA: Diagnosis not present

## 2018-07-04 DIAGNOSIS — I480 Paroxysmal atrial fibrillation: Secondary | ICD-10-CM | POA: Diagnosis not present

## 2018-07-04 DIAGNOSIS — F419 Anxiety disorder, unspecified: Secondary | ICD-10-CM | POA: Diagnosis not present

## 2018-07-12 DIAGNOSIS — I10 Essential (primary) hypertension: Secondary | ICD-10-CM | POA: Diagnosis not present

## 2018-07-12 DIAGNOSIS — E039 Hypothyroidism, unspecified: Secondary | ICD-10-CM | POA: Diagnosis not present

## 2018-07-12 DIAGNOSIS — M810 Age-related osteoporosis without current pathological fracture: Secondary | ICD-10-CM | POA: Diagnosis not present

## 2018-07-12 DIAGNOSIS — J449 Chronic obstructive pulmonary disease, unspecified: Secondary | ICD-10-CM | POA: Diagnosis not present

## 2018-07-12 DIAGNOSIS — I480 Paroxysmal atrial fibrillation: Secondary | ICD-10-CM | POA: Diagnosis not present

## 2018-07-20 DIAGNOSIS — R197 Diarrhea, unspecified: Secondary | ICD-10-CM | POA: Diagnosis not present

## 2018-08-23 ENCOUNTER — Other Ambulatory Visit: Payer: Self-pay | Admitting: Gastroenterology

## 2018-08-23 DIAGNOSIS — R197 Diarrhea, unspecified: Secondary | ICD-10-CM | POA: Diagnosis not present

## 2018-08-23 DIAGNOSIS — Z8719 Personal history of other diseases of the digestive system: Secondary | ICD-10-CM | POA: Diagnosis not present

## 2018-08-30 ENCOUNTER — Ambulatory Visit
Admission: RE | Admit: 2018-08-30 | Discharge: 2018-08-30 | Disposition: A | Discharge status: Home / Self Care | Payer: Medicare Other | Source: Ambulatory Visit | Attending: Gastroenterology | Admitting: Gastroenterology

## 2018-08-30 DIAGNOSIS — R197 Diarrhea, unspecified: Secondary | ICD-10-CM

## 2018-08-30 DIAGNOSIS — K5900 Constipation, unspecified: Secondary | ICD-10-CM | POA: Diagnosis not present

## 2018-08-30 MED ORDER — IOPAMIDOL (ISOVUE-300) INJECTION 61%
100.0000 mL | Freq: Once | INTRAVENOUS | Status: AC | PRN
  Administered 2018-08-30: 100 mL via INTRAVENOUS

## 2018-09-06 DIAGNOSIS — Z79899 Other long term (current) drug therapy: Secondary | ICD-10-CM | POA: Diagnosis not present

## 2018-09-06 DIAGNOSIS — I10 Essential (primary) hypertension: Secondary | ICD-10-CM | POA: Diagnosis not present

## 2018-09-06 DIAGNOSIS — M4004 Postural kyphosis, thoracic region: Secondary | ICD-10-CM | POA: Diagnosis not present

## 2018-09-06 DIAGNOSIS — R0609 Other forms of dyspnea: Secondary | ICD-10-CM | POA: Diagnosis not present

## 2018-09-06 DIAGNOSIS — E039 Hypothyroidism, unspecified: Secondary | ICD-10-CM | POA: Diagnosis not present

## 2018-09-06 DIAGNOSIS — R140 Abdominal distension (gaseous): Secondary | ICD-10-CM | POA: Diagnosis not present

## 2018-09-07 ENCOUNTER — Telehealth: Payer: Self-pay | Admitting: Hematology

## 2018-09-07 ENCOUNTER — Encounter: Payer: Self-pay | Admitting: Hematology

## 2018-09-07 NOTE — Telephone Encounter (Signed)
New referral received from Dr. Therisa Doyne for low IgA levels. Pt returned my call from 11/13 to schedule an appt. Pt has been scheduled to see Dr. Irene Limbo on 1pm. Aware to arrive 30 minutes early. Letter mailed.

## 2018-09-10 NOTE — Progress Notes (Signed)
Cardiology Office Note:    Date:  09/11/2018   ID:  Maria [name], DOB 1924/05/01, MRN [id number]  PCP:  [name], MD  Cardiologist:  No primary care provider on file.   Referring MD: [name], MD   Chief Complaint  Patient presents with  . Coronary Artery Disease  . Atrial Fibrillation    History of Present Illness:    Maria [name] is a 84 y.o. female with a hx of paroxysmal atrial fibrillation, amiodarone therapy, bilateral carotid stenosis, coronary artery disease with multivessel coronary bypass grafting 2014, chronic kidney disease, chronic anticoagulation with Xarelto, and hypertension.   He has multiple complaints.  Please see the review of systems.  Denies angina, prolonged palpitations, syncope, orthopnea, and PND.  She has not had peripheral edema.  She is losing weight and wonders if it could be related to any of her medications that we prescribed.    Past Medical History:  Diagnosis Date  . Anemia   . Atonic bladder 12/11/2013  . Atrial fibrillation ([place])   . Carotid artery occlusion    right carotid stenosis 40-60%, 4/08, neg for stenosis repeat study 11/11  . Celiac disease   . Chronic anticoagulation CHADS2VASC2 score 7 10/18/2014  . Chronic diastolic CHF (congestive heart failure) (HCC)    takes Lasix daily  . CKD (chronic kidney disease), stage III ([place])   . Coronary artery disease    a.  08/2013 (LIMA-LAD, rSVG to LCX and Lt atrial clip),  . Depression   . DVT (deep venous thrombosis) ([place]) 09/2013  . Gastric ulcer    6-64month ago  . GERD (gastroesophageal reflux disease)    takes Omeprazole daily  . GI bleeding   . Hemorrhoids   . Hot flashes    takes ZOloft 3 times a week  . Hypertension   . Hypothyroidism    takes Synthroid daily as result of AMiodarone  . Insomnia   . Macular degeneration   . Mild aortic insufficiency    a. Echo 10/2013: mild AI.  .Marland KitchenModerate mitral regurgitation by prior echocardiogram    a. Echo  10/2013.  . Moderate obstructive sleep apnea    uses CPAP;sleep study about 4-574monthago  . MVP (mitral valve prolapse)   . Osteoporosis   . PAF (paroxysmal atrial fibrillation) ([facility]  . Peripheral edema    left  . Presbycusis   . Pulmonary hypertension ([facility]  . RLS (restless legs syndrome)   . Thyroid nodule    24m59mno change 11/11  . Urethral stricture   . Urinary anastomotic stricture   . Urinary retention   . Vitamin D deficiency     Past Surgical History:  Procedure Laterality Date  . APPENDECTOMY    . bilateral cataract surgery    . CARDIAC CATHETERIZATION  08-06-13  . CORONARY ARTERY BYPASS GRAFT N/A 08/28/2013   Procedure: CORONARY ARTERY BYPASS GRAFTING (CABG) x2 using right greater saphenous vein and left internal mammary artery. ;  Surgeon: EdwGrace IsaacD;  [facility];  Laterality: N/A;  . HIP ARTHROPLASTY Right 12/09/2013   Procedure: ARTHROPLASTY BIPOLAR HIP CEMENTED;  Surgeon: FraKerin SalenD;  [facility];  Laterality: Right;  . INTRAOPERATIVE TRANSESOPHAGEAL ECHOCARDIOGRAM N/A 08/28/2013   Procedure: INTRAOPERATIVE TRANSESOPHAGEAL ECHOCARDIOGRAM;  Surgeon: EdwGrace IsaacD;  [facility];  Laterality: N/A;  . LEFT AND RIGHT HEART CATHETERIZATION WITH CORONARY  ANGIOGRAM N/A 08/16/2013   Procedure: LEFT AND RIGHT HEART CATHETERIZATION WITH CORONARY ANGIOGRAM;  Surgeon: [name], MD;  [facility];  Service: Cardiovascular;  Laterality: N/A;  . MANDIBLE SURGERY    . TCS    . TONSILLECTOMY    . trigger thumb    . TUBAL LIGATION      Current Medications: Current Meds  Medication Sig  . acetaminophen (TYLENOL) 500 MG tablet Take 1,000 mg by mouth every 6 (six) hours as needed.  Marland Kitchen amiodarone (PACERONE) 100 MG tablet Take 100 mg by mouth daily.  Marland Kitchen amLODipine (NORVASC) 2.5 MG tablet Take 2.5 mg by mouth 3 (three) times a week. (Monday,  Wednesday, and Friday)  . furosemide (LASIX) 20 MG tablet Take 20 mg by mouth daily.  Marland Kitchen levothyroxine (SYNTHROID, LEVOTHROID) 50 MCG tablet Take 1 tablet (50 mcg total) by mouth daily before breakfast.  . Magnesium 250 MG TABS Take 250 mg by mouth daily.  . Melatonin CR 3 MG TBCR Take 3 mg by mouth at bedtime.  . MULTIPLE VITAMIN PO Take 1 tablet by mouth daily.  . polyethylene glycol (MIRALAX / GLYCOLAX) packet Take 17 g by mouth daily.  . Rivaroxaban (XARELTO) 15 MG TABS tablet TAKE ONE TABLET BY MOUTH ONCE DAILY WITH SUPPER  . traZODone (DESYREL) 50 MG tablet Take 25 mg by mouth at bedtime as needed for sleep.     Allergies:   Alendronate sodium; Amoxicillin; Codeine; Fosamax [alendronate sodium]; Gluten meal; Hydrochlorothiazide; Tetanus toxoid, adsorbed; and Tetanus toxoids   Social History   Socioeconomic History  . Marital status: Widowed    Spouse name: Not on file  . Number of children: Not on file  . Years of education: Not on file  . Highest education level: Not on file  Occupational History  . Not on file  Social Needs  . Financial resource strain: Not on file  . Food insecurity:    Worry: Not on file    Inability: Not on file  . Transportation needs:    Medical: Not on file    Non-medical: Not on file  Tobacco Use  . Smoking status: Never Smoker  . Smokeless tobacco: Never Used  Substance and Sexual Activity  . Alcohol use: No  . Drug use: No  . Sexual activity: Yes    Birth control/protection: Surgical  Lifestyle  . Physical activity:    Days per week: Not on file    Minutes per session: Not on file  . Stress: Not on file  Relationships  . Social connections:    Talks on phone: Not on file    Gets together: Not on file    Attends religious service: Not on file    Active member of club or organization: Not on file    Attends meetings of clubs or organizations: Not on file    Relationship status: Not on file  Other Topics Concern  . Not on file    Social History Narrative  . Not on file     Family History: The patient's family history includes AAA (abdominal aortic aneurysm) in her brother; CAD in her father; CVA in her mother; Colon cancer in her father; Heart attack in her brother, father, and mother; Peripheral vascular disease in her brother.  ROS:   Please see the history of present illness.    Hearing loss, constipation, cough, diarrhea, muscle pain, dizziness, back pain with recent T4 or T12 compression fracture, difficulty with balance, difficulty urinating,  anxiety, constipation, wheezing, snoring, facial swelling, leg pain, and fatigue.  All other systems reviewed and are negative.  EKGs/Labs/Other Studies Reviewed:    The following studies were reviewed today: No new cardiac data  EKG:  EKG is not ordered today.  The ekg ordered today demonstrates sinus rhythm, left anterior hemiblock, right bundle branch block, first-degree AV block.  Findings are consistent with trifascicular block.  Recent Labs: No results found for requested labs within last 8760 hours.  Recent Lipid Panel    Component Value Date/Time   CHOL 173 10/19/2014 0315   TRIG 85 10/19/2014 0315   HDL 54 10/19/2014 0315   CHOLHDL 3.2 10/19/2014 0315   VLDL 17 10/19/2014 0315   LDLCALC 102 (H) 10/19/2014 0315    Physical Exam:    VS:  BP (!) 148/62   Pulse 66   Ht _0  (1.549 m)   Wt 105 lb 1.9 oz (47.7 kg)   SpO2 97%   BMI 19.86 kg/m     Wt Readings from Last 3 Encounters:  09/11/18 105 lb 1.9 oz (47.7 kg)  06/15/18 104 lb (47.2 kg)  10/27/17 104 lb (47.2 kg)     GEN:  Well nourished, well developed in no acute distress HEENT: Normal NECK: No JVD. LYMPHATICS: No lymphadenopathy CARDIAC: RRR, 1/6 to 2/6 systolic murmur, no gallop, no edema. VASCULAR: 2+ bilateral radial and carotid pulses.  Right carotid bruits. RESPIRATORY:  Clear to auscultation without rales, wheezing or rhonchi  ABDOMEN: Soft, non-tender, non-distended, No  pulsatile mass, MUSCULOSKELETAL: No deformity  SKIN: Warm and dry NEUROLOGIC:  Alert and oriented x 3 PSYCHIATRIC:  Normal affect   ASSESSMENT:    1. Paroxysmal atrial fibrillation (HCC)   2. Chronic anticoagulation CHADS2VASC2 score 7   3. Coronary artery disease involving coronary bypass graft of native heart with angina pectoris ([place])   4. On amiodarone therapy   5. Trifascicular block   6. Nonrheumatic mitral valve regurgitation   7. Sinoatrial node dysfunction (HCC)    PLAN:    In order of problems listed above:  1. Sinus rhythm on low-dose amiodarone.  Maintaining sinus rhythm 2. No bleeding complications. 3. No symptoms of angina.  Risk factor modification discussion.  At her age, we need to be reasonable with recommendations. 4. C met and TSH today to rule out amiodarone toxicity.  76-monthfollow-up with APP at which time TSH and liver panel should be repeated.  See me in 1 year.  Clinically stable.  Poly-system somatic complaints not cardiac related.  651-monthollow-up for amiodarone monitoring with APP team member.  1226-monthllow-up with me.    Medication Adjustments/Labs and Tests Ordered: Current medicines are reviewed at length with the patient today.  Concerns regarding medicines are outlined above.  Orders Placed This Encounter  Procedures  . Comprehensive metabolic panel  . TSH  . EKG 12-Lead   No orders of the defined types were placed in this encounter.   Patient Instructions  Medication Instructions:  No change If you need a refill on your cardiac medications before your next appointment, please call your pharmacy.   Lab work: Today: cmet, tsh  If you have labs (blood work) drawn today and your tests are completely normal, you will receive your results only by: . MMarland KitchenChart Message (if you have MyChart) OR . A paper copy in the mail If you have any lab test that is abnormal or we need to change your treatment, we will call you to review the  results.  Testing/Procedures: none  Follow-Up: Your physician recommends that you schedule a follow-up appointment in: 6 months with physician assistant/nurse practitioner on Dr. Thompson Caul care team.  Your physician wants you to follow-up in: 12 months with Dr. [name]. You will receive a reminder letter in the mail two months in advance. If you don't receive a letter, please call our office to schedule the follow-up appointment.  Any Other Special Instructions Will Be Listed Below (If Applicable).       Signed, [name], MD  09/11/2018 5:28 PM    [place]

## 2018-09-11 ENCOUNTER — Ambulatory Visit (INDEPENDENT_AMBULATORY_CARE_PROVIDER_SITE_OTHER): Payer: Medicare Other | Admitting: Interventional Cardiology

## 2018-09-11 ENCOUNTER — Encounter: Payer: Self-pay | Admitting: Interventional Cardiology

## 2018-09-11 VITALS — BP 148/62 | HR 66 | Ht 61.0 in | Wt 105.1 lb

## 2018-09-11 DIAGNOSIS — I495 Sick sinus syndrome: Secondary | ICD-10-CM | POA: Diagnosis not present

## 2018-09-11 DIAGNOSIS — I340 Nonrheumatic mitral (valve) insufficiency: Secondary | ICD-10-CM

## 2018-09-11 DIAGNOSIS — I25709 Atherosclerosis of coronary artery bypass graft(s), unspecified, with unspecified angina pectoris: Secondary | ICD-10-CM | POA: Diagnosis not present

## 2018-09-11 DIAGNOSIS — Z79899 Other long term (current) drug therapy: Secondary | ICD-10-CM

## 2018-09-11 DIAGNOSIS — I453 Trifascicular block: Secondary | ICD-10-CM

## 2018-09-11 DIAGNOSIS — I480 Paroxysmal atrial fibrillation: Secondary | ICD-10-CM | POA: Diagnosis not present

## 2018-09-11 DIAGNOSIS — Z7901 Long term (current) use of anticoagulants: Secondary | ICD-10-CM | POA: Diagnosis not present

## 2018-09-11 NOTE — Patient Instructions (Signed)
Medication Instructions:  No change If you need a refill on your cardiac medications before your next appointment, please call your pharmacy.   Lab work: Today: cmet, tsh  If you have labs (blood work) drawn today and your tests are completely normal, you will receive your results only by: Marland Kitchen MyChart Message (if you have MyChart) OR . A paper copy in the mail If you have any lab test that is abnormal or we need to change your treatment, we will call you to review the results.  Testing/Procedures: none  Follow-Up: Your physician recommends that you schedule a follow-up appointment in: 6 months with physician assistant/nurse practitioner on Dr. Thompson Caul care team.  Your physician wants you to follow-up in: 12 months with Dr. [name]. You will receive a reminder letter in the mail two months in advance. If you don't receive a letter, please call our office to schedule the follow-up appointment.  Any Other Special Instructions Will Be Listed Below (If Applicable).

## 2018-09-12 LAB — COMPREHENSIVE METABOLIC PANEL
ALK PHOS: 90 IU/L (ref 39–117)
ALT: 11 IU/L (ref 0–32)
AST: 25 IU/L (ref 0–40)
Albumin/Globulin Ratio: 1.3 (ref 1.2–2.2)
Albumin: 4.2 g/dL (ref 3.2–4.6)
BILIRUBIN TOTAL: 0.3 mg/dL (ref 0.0–1.2)
BUN/Creatinine Ratio: 17 (ref 12–28)
BUN: 11 mg/dL (ref 10–36)
CALCIUM: 9.3 mg/dL (ref 8.7–10.3)
CHLORIDE: 100 mmol/L (ref 96–106)
CO2: 20 mmol/L (ref 20–29)
Creatinine, Ser: 0.66 mg/dL (ref 0.57–1.00)
GFR calc Af Amer: 89 mL/min/{1.73_m2} (ref >59)
GFR calc non Af Amer: 77 mL/min/{1.73_m2} (ref >59)
Globulin, Total: 3.3 g/dL (ref 1.5–4.5)
Glucose: 97 mg/dL (ref 65–99)
Potassium: 4.1 mmol/L (ref 3.5–5.2)
Sodium: 136 mmol/L (ref 134–144)
Total Protein: 7.5 g/dL (ref 6.0–8.5)

## 2018-09-12 LAB — TSH: TSH: 2.2 u[IU]/mL (ref 0.450–4.500)

## 2018-09-13 DIAGNOSIS — D509 Iron deficiency anemia, unspecified: Secondary | ICD-10-CM | POA: Diagnosis not present

## 2018-09-18 ENCOUNTER — Ambulatory Visit (HOSPITAL_COMMUNITY)
Admission: RE | Admit: 2018-09-18 | Discharge: 2018-09-18 | Disposition: A | Discharge status: Home / Self Care | Payer: Medicare Other | Source: Ambulatory Visit | Attending: Hematology | Admitting: Hematology

## 2018-09-18 DIAGNOSIS — D509 Iron deficiency anemia, unspecified: Secondary | ICD-10-CM | POA: Insufficient documentation

## 2018-09-18 MED ORDER — SODIUM CHLORIDE 0.9 % IV SOLN
510.0000 mg | Freq: Once | INTRAVENOUS | Status: AC
  Administered 2018-09-18: 510 mg via INTRAVENOUS
  Filled 2018-09-18: qty 17

## 2018-09-18 MED ORDER — SODIUM CHLORIDE 0.9 % IV SOLN
INTRAVENOUS | Status: DC | PRN
  Administered 2018-09-18: 250 mL via INTRAVENOUS

## 2018-09-18 NOTE — Discharge Instructions (Signed)

## 2018-09-18 NOTE — Progress Notes (Signed)
PATIENT CARE CENTER NOTE  Diagnosis: Iron Deficiency Anemia    Provider: Dr. Therisa Doyne   Procedure: IV Feraheme     Note: Patient received Feraheme infusion. Tolerated infusion well with no adverse reaction. Monitored patient for 30 minutes post-infusion. Vital signs stable. Discharge instructions given to patient. Patient alert, oriented and ambulatory at discharge.

## 2018-09-26 NOTE — Progress Notes (Signed)
HEMATOLOGY/ONCOLOGY CONSULTATION NOTE  Date of Service: 09/27/2018  Patient Care Team: [name], MD as PCP - General (Internal Medicine) Belva Crome, MD as Consulting Physician (Cardiology)  Ronnette Juniper, MD as GI  CHIEF COMPLAINTS/PURPOSE OF CONSULTATION:  Low IgA Level  HISTORY OF PRESENTING ILLNESS:   Maria [name] is a wonderful 84 y.o. female who has been referred to [place] by Dr. Ronnette Juniper for evaluation and management of low IgA level. She is accompanied today by her daughter. The pt reports that she is doing well overall. [place] last [place].   The pt notes that she was diagnosed with Celiac's disease when she was 84 years old. She notes that she has almost always been anemic, and has occasionally been short of breath and endorses low energy levels. The pt notes that she has a "tremendous itch" on her shoulders which she has scratched frequently with some resultant scabbing.    The pt notes that she believes that her food is not gluten free at her retirement village.   The pt sees Dr. Ronnette Juniper in GI for persistent diarrhea and her celiac's disease, and notes that Dr. Therisa Doyne believes she may be losing blood. The pt notes that her HGB was most recently 9 and her Ferritin was 2, though these labs have not yet been made available at the time of visit. She notes that she has received one iron infusion, and is anticipating her second sometime next week.   The pt notes that she had 2 months of recent ongoing diarrhea, which suddenly stopped, and has since had trouble with constipation.   The pt reports that she has been recently diagnosed with chronic sensorimotor axonal and demyelinating neuropathy with Dr. Metta Clines in Neurology, and has chosen not to take Gabapentin as he suggested with concerns for the potential side effects.  She also take thyroid replacement and notes that the cause of her low thyroid levels has not been  elucidated. She notes that she has not lost any weight in the last couple months.   Of note prior to the patient's visit today, pt has had a CT A/P completed on 08/30/18 with results revealing No acute intra-abdominal or intrapelvic abnormalities. Probable small hiatal hernia. Aortic Atherosclerosis and Emphysema.   Most recent lab results (04/19/18) of CBC w/diff and CMP is as follows: all values are WNL except for HGB at 11.0, HCT at 33.2, MCV at 78.5, MCH at 26.0, RDW at 15.9, Sodium at 134. 04/19/18 Iron Panel revealed a 2% Saturation Ratio  06/15/18 Immunofixation electrophoresis revealed IgA at <5, IgG at 1750, and IgM at 250 06/15/18 Sed Rate at 50  On review of systems, pt reports constipation, itching skin on shoulders, low energy, some SOB, and denies unexpected weight loss, leg swelling, abdominal pains, and any other symptoms.   On PMHx the pt reports Celiac's disease, iron deficiency anemia.  MEDICAL HISTORY:  Past Medical History:  Diagnosis Date  . Anemia   . Atonic bladder 12/11/2013  . Atrial fibrillation ([place])   . Carotid artery occlusion    right carotid stenosis 40-60%, 4/08, neg for stenosis repeat study 11/11  . Celiac disease   . Chronic anticoagulation CHADS2VASC2 score 7 10/18/2014  . Chronic diastolic CHF (congestive heart failure) (HCC)    takes Lasix daily  . CKD (chronic kidney disease), stage III ([place])   . Coronary artery disease    a.  08/2013 (  LIMA-LAD, rSVG to LCX and Lt atrial clip),  . Depression   . DVT (deep venous thrombosis) ([place]) 09/2013  . Gastric ulcer    6-29month ago  . GERD (gastroesophageal reflux disease)    takes Omeprazole daily  . GI bleeding   . Hemorrhoids   . Hot flashes    takes ZOloft 3 times a week  . Hypertension   . Hypothyroidism    takes Synthroid daily as result of AMiodarone  . Insomnia   . Macular degeneration   . Mild aortic insufficiency    a. Echo 10/2013: mild AI.  .Marland KitchenModerate mitral regurgitation by prior  echocardiogram    a. Echo 10/2013.  . Moderate obstructive sleep apnea    uses CPAP;sleep study about 4-563monthago  . MVP (mitral valve prolapse)   . Osteoporosis   . PAF (paroxysmal atrial fibrillation) ([place]  . Peripheral edema    left  . Presbycusis   . Pulmonary hypertension ([place]  . RLS (restless legs syndrome)   . Thyroid nodule    51m56mno change 11/11  . Urethral stricture   . Urinary anastomotic stricture   . Urinary retention   . Vitamin D deficiency     SURGICAL HISTORY: Past Surgical History:  Procedure Laterality Date  . APPENDECTOMY    . bilateral cataract surgery    . CARDIAC CATHETERIZATION  08-06-13  . CORONARY ARTERY BYPASS GRAFT N/A 08/28/2013   Procedure: CORONARY ARTERY BYPASS GRAFTING (CABG) x2 using right greater saphenous vein and left internal mammary artery. ;  Surgeon: EdwGrace IsaacD;  [facility];  Laterality: N/A;  . HIP ARTHROPLASTY Right 12/09/2013   Procedure: ARTHROPLASTY BIPOLAR HIP CEMENTED;  Surgeon: FraKerin SalenD;  [facility];  Laterality: Right;  . INTRAOPERATIVE TRANSESOPHAGEAL ECHOCARDIOGRAM N/A 08/28/2013   Procedure: INTRAOPERATIVE TRANSESOPHAGEAL ECHOCARDIOGRAM;  Surgeon: EdwGrace IsaacD;  [facility];  Laterality: N/A;  . LEFT AND RIGHT HEART CATHETERIZATION WITH CORONARY ANGIOGRAM N/A 08/16/2013   Procedure: LEFT AND RIGHT HEART CATHETERIZATION WITH CORONARY ANGIOGRAM;  Surgeon: HenSinclair GroomsD;  [facility];  Service: Cardiovascular;  Laterality: N/A;  . MANDIBLE SURGERY    . TCS    . TONSILLECTOMY    . trigger thumb    . TUBAL LIGATION      SOCIAL HISTORY: Social History   Socioeconomic History  . Marital status: Widowed    Spouse name: Not on file  . Number of children: Not on file  . Years of education: Not on file  . Highest education level: Not on file  Occupational History  . Not on file    Social Needs  . Financial resource strain: Not on file  . Food insecurity:    Worry: Not on file    Inability: Not on file  . Transportation needs:    Medical: Not on file    Non-medical: Not on file  Tobacco Use  . Smoking status: Never Smoker  . Smokeless tobacco: Never Used  Substance and Sexual Activity  . Alcohol use: No  . Drug use: No  . Sexual activity: Yes    Birth control/protection: Surgical  Lifestyle  . Physical activity:    Days per week: Not on file    Minutes per session: Not on file  . Stress: Not on file  Relationships  . Social connections:    Talks  on phone: Not on file    Gets together: Not on file    Attends religious service: Not on file    Active member of club or organization: Not on file    Attends meetings of clubs or organizations: Not on file    Relationship status: Not on file  . Intimate partner violence:    Fear of current or ex partner: Not on file    Emotionally abused: Not on file    Physically abused: Not on file    Forced sexual activity: Not on file  Other Topics Concern  . Not on file  Social History Narrative  . Not on file    FAMILY HISTORY: Family History  Problem Relation Age of Onset  . Heart attack Mother   . CVA Mother   . CAD Father   . Colon cancer Father   . Heart attack Father   . Heart attack Brother   . Peripheral vascular disease Brother   . AAA (abdominal aortic aneurysm) Brother     ALLERGIES:  is allergic to alendronate sodium; amoxicillin; codeine; fosamax [alendronate sodium]; gluten meal; hydrochlorothiazide; tetanus toxoid, adsorbed; and tetanus toxoids.  MEDICATIONS:  Current Outpatient Medications  Medication Sig Dispense Refill  . acetaminophen (TYLENOL) 500 MG tablet Take 1,000 mg by mouth every 6 (six) hours as needed.    Marland Kitchen amiodarone (PACERONE) 100 MG tablet Take 100 mg by mouth daily.    Marland Kitchen amLODipine (NORVASC) 2.5 MG tablet Take 2.5 mg by mouth 3 (three) times a week. (Monday, Wednesday,  and Friday)    . furosemide (LASIX) 20 MG tablet Take 20 mg by mouth daily.    Marland Kitchen levothyroxine (SYNTHROID, LEVOTHROID) 50 MCG tablet Take 1 tablet (50 mcg total) by mouth daily before breakfast. 30 tablet 11  . Magnesium 250 MG TABS Take 250 mg by mouth daily.    . Melatonin CR 3 MG TBCR Take 3 mg by mouth at bedtime.    . MULTIPLE VITAMIN PO Take 1 tablet by mouth daily.    . polyethylene glycol (MIRALAX / GLYCOLAX) packet Take 17 g by mouth daily.    . Rivaroxaban (XARELTO) 15 MG TABS tablet TAKE ONE TABLET BY MOUTH ONCE DAILY WITH SUPPER 90 tablet 0  . traZODone (DESYREL) 50 MG tablet Take 25 mg by mouth at bedtime as needed for sleep.     No current facility-administered medications for this visit.     REVIEW OF SYSTEMS:    10 Point review of Systems was done is negative except as noted above.  PHYSICAL EXAMINATION:  . Vitals:   09/27/18 1300  BP: (!) 148/58  Pulse: 66  Resp: 18  Temp: 98.1 F (36.7 C)  SpO2: 100%   Filed Weights   09/27/18 1300  Weight: 107 lb (48.5 kg)   .Body mass index is 20.22 kg/m.  GENERAL:alert, in no acute distress and comfortable SKIN: no acute rashes, no significant lesions EYES: conjunctiva are pink and non-injected, sclera anicteric OROPHARYNX: MMM, no exudates, no oropharyngeal erythema or ulceration NECK: supple, no JVD LYMPH:  no palpable lymphadenopathy in the cervical, axillary or inguinal regions LUNGS: clear to auscultation b/l with normal respiratory effort HEART: regular rate & rhythm ABDOMEN:  normoactive bowel sounds , non tender, not distended. Extremity: no pedal edema PSYCH: alert & oriented x 3 with fluent speech NEURO: no focal motor/sensory deficits  LABORATORY DATA:  I have reviewed the data as listed  . CBC Latest Ref Rng & Units 06/16/2016 06/09/2015  10/19/2014  WBC 4.0 - 10.5 K/uL 6.7 5.5 5.0  Hemoglobin 12.0 - 15.0 g/dL 13.2 13.2 10.5(L)  Hematocrit 36.0 - 46.0 % 39.2 39.0 31.7(L)  Platelets 150 - 400 K/uL  289 284 273    . CMP Latest Ref Rng & Units 09/11/2018 06/15/2018 06/16/2016  Glucose 65 - 99 mg/dL 97 - 103(H)  BUN 10 - 36 mg/dL 11 - 8  Creatinine 0.57 - 1.00 mg/dL 0.66 - 0.67  Sodium 134 - 144 mmol/L 136 - 132(L)  Potassium 3.5 - 5.2 mmol/L 4.1 - 4.4  Chloride 96 - 106 mmol/L 100 - 100(L)  CO2 20 - 29 mmol/L 20 - 23  Calcium 8.7 - 10.3 mg/dL 9.3 - 9.6  Total Protein 6.0 - 8.5 g/dL 7.5 7.5 -  Total Bilirubin 0.0 - 1.2 mg/dL 0.3 - -  Alkaline Phos 39 - 117 IU/L 90 - -  AST 0 - 40 IU/L 25 - -  ALT 0 - 32 IU/L 11 - -   Component     Latest Ref Rng & Units 11/17/2016 07/01/2017  Glucose     65 - 99 mg/dL    BUN     10 - 36 mg/dL    Creatinine     0.57 - 1.00 mg/dL    GFR, Est Non African American     >59 mL/min/1.73    GFR, Est African American     >59 mL/min/1.73    BUN/Creatinine Ratio     12 - 28    Sodium     134 - 144 mmol/L    Potassium     3.5 - 5.2 mmol/L    Chloride     96 - 106 mmol/L    CO2     20 - 29 mmol/L    Calcium     8.7 - 10.3 mg/dL    Total Protein     6.0 - 8.5 g/dL    Albumin     3.2 - 4.6 g/dL    Globulin, Total     1.5 - 4.5 g/dL    Albumin/Globulin Ratio     1.2 - 2.2    Total Bilirubin     0.0 - 1.2 mg/dL    Alkaline Phosphatase     39 - 117 IU/L    AST     0 - 40 IU/L    ALT     0 - 32 IU/L    Albumin ELP     3.8 - 4.8 g/dL    Alpha 1     0.2 - 0.3 g/dL    Alpha 2     0.5 - 0.9 g/dL    Beta Globulin     0.4 - 0.6 g/dL    Beta 2     0.2 - 0.5 g/dL    Gamma Globulin     0.8 - 1.7 g/dL    SPE Interp.         Immunofix Electr Int         Immunoglobulin A     20 - 320 mg/dL    IgG (Immunoglobin G), Serum     600 - 1,540 mg/dL    IgM, Serum     50 - 300 mg/dL    Vitamin B12     211 - 911 pg/mL 365   Ferritin     20 - 288 ng/mL  23  Sed Rate     0 - 30 mm/hr    Anti  Nuclear Antibody(ANA)     NEGATIVE    TSH     0.450 - 4.500 uIU/mL     Component     Latest Ref Rng & Units 06/15/2018 09/11/2018  Glucose     65  - 99 mg/dL  97  BUN     10 - 36 mg/dL  11  Creatinine     0.57 - 1.00 mg/dL  0.66  GFR, Est Non African American     >59 mL/min/1.73  77  GFR, Est African American     >59 mL/min/1.73  89  BUN/Creatinine Ratio     12 - 28  17  Sodium     134 - 144 mmol/L  136  Potassium     3.5 - 5.2 mmol/L  4.1  Chloride     96 - 106 mmol/L  100  CO2     20 - 29 mmol/L  20  Calcium     8.7 - 10.3 mg/dL  9.3  Total Protein     6.0 - 8.5 g/dL 7.5 7.5  Albumin     3.2 - 4.6 g/dL  4.2  Globulin, Total     1.5 - 4.5 g/dL  3.3  Albumin/Globulin Ratio     1.2 - 2.2  1.3  Total Bilirubin     0.0 - 1.2 mg/dL  0.3  Alkaline Phosphatase     39 - 117 IU/L  90  AST     0 - 40 IU/L  25  ALT     0 - 32 IU/L  11  Albumin ELP     3.8 - 4.8 g/dL 4.0   Alpha 1     0.2 - 0.3 g/dL 0.3   Alpha 2     0.5 - 0.9 g/dL 0.8   Beta Globulin     0.4 - 0.6 g/dL 0.5   Beta 2     0.2 - 0.5 g/dL 0.2   Gamma Globulin     0.8 - 1.7 g/dL 1.6   SPE Interp.         Immunofix Electr Int      NO MONOCLONAL PROTEIN DETECTED   Immunoglobulin A     20 - 320 mg/dL <5 (L)   IgG (Immunoglobin G), Serum     600 - 1,540 mg/dL 1,750 (H)   IgM, Serum     50 - 300 mg/dL 250   Vitamin B12     211 - 911 pg/mL    Ferritin     20 - 288 ng/mL    Sed Rate     0 - 30 mm/hr 50 (H)   Anti Nuclear Antibody(ANA)     NEGATIVE NEGATIVE   TSH     0.450 - 4.500 uIU/mL  2.200    RADIOGRAPHIC STUDIES: I have personally reviewed the radiological images as listed and agreed with the findings in the report. Ct Abdomen Pelvis W Contrast  Result Date: 08/31/2018 CLINICAL DATA:  Alternating constipation and diarrhea for 4 months, prior appendectomy and tubal ligation, history atrial fibrillation, celiac disease, chronic diastolic CHF, coronary artery disease, hypertension, bladder atony EXAM: CT ABDOMEN AND PELVIS WITH CONTRAST TECHNIQUE: Multidetector CT imaging of the abdomen and pelvis was performed using the standard protocol  following bolus administration of intravenous contrast. Sagittal and coronal MPR images reconstructed from axial data set. Creatinine was obtained on site at [facility] at [address] Results: Creatinine 0.8 mg/dL. CONTRAST:  122m ISOVUE-300 IOPAMIDOL (ISOVUE-300)  INJECTION 61% IV. COMPARISON:  09/05/2015 FINDINGS: Lower chest: Lung bases appear emphysematous but clear Hepatobiliary: Gallbladder and liver normal appearance Pancreas: Normal appearance Spleen: Normal appearance Adrenals/Urinary Tract: Adrenal glands normal appearance. Kidneys, ureters, and bladder grossly normal appearance, with note of beam hardening artifacts in pelvis limiting assessment of the distal ureters and bladder Stomach/Bowel: Appendix surgically absent by history. Scattered stool throughout colon. Stomach decompressed with probable small hiatal hernia. Bowel loops otherwise normal appearance. Vascular/Lymphatic: Atherosclerotic calcifications aorta with abdominal aortic tortuosity. No aortic aneurysm. Additional atherosclerotic calcifications at iliac arteries and the origins of the celiac artery, RIGHT renal artery and SMA. No adenopathy. Reproductive: Atrophic uterus and ovaries Other: No free air or free fluid.  No hernia Musculoskeletal: Bones to be sleep demineralized. RIGHT hip prosthesis with associated beam hardening artifacts in pelvis. Degenerative disc and facet disease changes lumbar spine. IMPRESSION: No acute intra-abdominal or intrapelvic abnormalities. Probable small hiatal hernia. Aortic Atherosclerosis (ICD10-I70.0) and Emphysema (ICD10-J43.9). Electronically Signed   By: Lavonia Dana M.D.   On: 08/31/2018 07:26    ASSESSMENT & PLAN:  84 y.o. female with  1. 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 PLan -Discussed patient's most recent labs from 06/15/18, IgA <5, IgG at 1750, IgM at 250. Sed Rate at 50.  -Discussed that IgA deficiency and her Celiac's disease represents a tendency toward  autoimmunity -Discussed that I do suspect the patient has had low IgA levels all of her life. No significant issues with recurrent infections. -Discussed CBC w/diff from 04/19/18 which revealed some anemia with HGB at 11.0, normal WBC and normal PLT. Also, 2% Iron saturation from 04/19/18.  -Proceed with IV Iron replacement and Vitamin B optimization with PCP/GI -Last available Vitamin B12 level from 11/17/16 was low at 365, in the context of lower extremity sensorimotor neuropathy  -Recommend SL 1072mg Vitamin B12 replacement -Recommend taking a Vitamin B complex  -Will recommend evaluaione for pernicious anemia- check parietal cell ab anf Intrinsic factor Antibody with PCP. -receiving IV iroin with PCP/GI -Will be happy to see the pt back as needed     RTC with Dr KIrene Limboas needed   All of the patients questions were answered with apparent satisfaction. The patient knows to call the clinic with any problems, questions or concerns.  The total time spent in the appt was 45 minutes and more than 50% was on counseling and direct patient cares.    GSullivan LoneMD MS AAHIVMS SMercy Hospital BerryvilleCCrown Point Surgery CenterHematology/Oncology Physician [facility] (Office):       [phone](Work cell):  [phone](Fax):           [phone] 09/27/2018 2:21 PM  I, SBaldwin Jamaica am acting as a scribe for Dr. GSullivan Lone   .I have reviewed the above documentation for accuracy and completeness, and I agree with the above. GSullivan LoneMD MS

## 2018-09-27 ENCOUNTER — Inpatient Hospital Stay: Payer: Medicare Other | Attending: Hematology | Admitting: Hematology

## 2018-09-27 ENCOUNTER — Encounter: Payer: Self-pay | Admitting: Hematology

## 2018-09-27 VITALS — BP 148/58 | HR 66 | Temp 98.1°F | Resp 18 | Ht 61.0 in | Wt 107.0 lb

## 2018-09-27 DIAGNOSIS — D509 Iron deficiency anemia, unspecified: Secondary | ICD-10-CM | POA: Insufficient documentation

## 2018-09-27 DIAGNOSIS — D802 Selective deficiency of immunoglobulin A [IgA]: Secondary | ICD-10-CM | POA: Diagnosis not present

## 2018-09-27 DIAGNOSIS — G6289 Other specified polyneuropathies: Secondary | ICD-10-CM | POA: Diagnosis not present

## 2018-09-27 DIAGNOSIS — I130 Hypertensive heart and chronic kidney disease with heart failure and stage 1 through stage 4 chronic kidney disease, or unspecified chronic kidney disease: Secondary | ICD-10-CM | POA: Diagnosis not present

## 2018-09-27 DIAGNOSIS — K900 Celiac disease: Secondary | ICD-10-CM | POA: Diagnosis not present

## 2018-09-27 DIAGNOSIS — I25709 Atherosclerosis of coronary artery bypass graft(s), unspecified, with unspecified angina pectoris: Secondary | ICD-10-CM

## 2018-09-27 DIAGNOSIS — N183 Chronic kidney disease, stage 3 (moderate): Secondary | ICD-10-CM

## 2018-09-27 DIAGNOSIS — E039 Hypothyroidism, unspecified: Secondary | ICD-10-CM | POA: Insufficient documentation

## 2018-09-27 NOTE — Patient Instructions (Signed)
Thank you for choosing [facility] to provide your oncology and hematology care.  To afford each patient quality time with our providers, please arrive 30 minutes before your scheduled appointment time.  If you arrive late for your appointment, you may be asked to reschedule.  We strive to give you quality time with our providers, and arriving late affects you and other patients whose appointments are after yours.    If you are a no show for multiple scheduled visits, you may be dismissed from the clinic at the providers discretion.     Again, thank you for choosing [facility], our hope is that these requests will decrease the amount of time that you wait before being seen by our physicians.  ______________________________________________________________________   Should you have questions after your visit to the [facility], please contact our office at [phone] between the hours of 8:30 and 4:30 p.m.    Voicemails left after 4:30p.m will not be returned until the following business day.     For prescription refill requests, please have your pharmacy contact us directly.  Please also try to allow 48 hours for prescription requests.     Please contact the scheduling department for questions regarding scheduling.  For scheduling of procedures such as PET scans, CT scans, MRI, Ultrasound, etc please contact central scheduling at [phone].     Resources For Cancer Patients and Caregivers:    Oncolink.org:  A wonderful resource for patients and healthcare providers for information regarding your disease, ways to tract your treatment, what to expect, etc.      American Cancer Society:  800-227-2345  Can help patients locate various types of support and financial assistance   Cancer Care: 1-800-813-HOPE (4673) Provides financial assistance, online support groups, medication/co-pay assistance.     [place] DSS:  336-641-3447 Where to apply  for food stamps, Medicaid, and utility assistance   Medicare Rights Center: 800-333-4114 Helps people with Medicare understand their rights and benefits, navigate the Medicare system, and secure the quality healthcare they deserve   SCAT: 336-333-6589 [employer]'s shared-ride transportation service for eligible riders who have a disability that prevents them from riding the fixed route bus.     For additional information on assistance programs please contact our social worker:   Maria [name]:  [phone]  

## 2018-09-29 ENCOUNTER — Telehealth: Payer: Self-pay

## 2018-09-29 NOTE — Telephone Encounter (Signed)
RTC with Dr Irene Limbo as needed. Per 12/6 los

## 2018-10-02 ENCOUNTER — Ambulatory Visit (HOSPITAL_COMMUNITY)
Admission: RE | Admit: 2018-10-02 | Discharge: 2018-10-02 | Disposition: A | Discharge status: Home / Self Care | Payer: Medicare Other | Source: Ambulatory Visit | Attending: Hematology | Admitting: Hematology

## 2018-10-02 DIAGNOSIS — D509 Iron deficiency anemia, unspecified: Secondary | ICD-10-CM | POA: Diagnosis not present

## 2018-10-02 MED ORDER — SODIUM CHLORIDE 0.9 % IV SOLN
510.0000 mg | Freq: Once | INTRAVENOUS | Status: AC
  Administered 2018-10-02: 510 mg via INTRAVENOUS
  Filled 2018-10-02: qty 17

## 2018-10-02 MED ORDER — SODIUM CHLORIDE 0.9 % IV SOLN
INTRAVENOUS | Status: DC | PRN
  Administered 2018-10-02: 250 mL via INTRAVENOUS

## 2018-10-02 NOTE — Progress Notes (Signed)
Patient received Feraheme  via a PIV as ordered. Observed for at least 30 minutes post infusion.Tolerated well, vitals stable, discharge instructions given, verbalized understanding. Patient alert, oriented and ambulatory at the time of discharge.

## 2018-10-02 NOTE — Progress Notes (Signed)
PATIENT CARE CENTER NOTE  Diagnosis: Iron Deficiency Anemia    Provider: Dr. Irene Limbo   Procedure: IV Feraheme    Note: Patient received Feraheme infusion. Tolerated well with no adverse reaction. Observed patient for 30 minutes post-infusion. Vital signs stable. Discharge instructions given. Transportation service called and notified to pick patient up. Patient alert, oriented and ambulatory to wheelchair at discharge.

## 2018-10-02 NOTE — Discharge Instructions (Signed)

## 2018-11-01 ENCOUNTER — Ambulatory Visit: Payer: Medicare Other | Admitting: Neurology

## 2018-11-02 ENCOUNTER — Ambulatory Visit: Payer: Medicare Other | Admitting: Neurology

## 2018-11-15 DIAGNOSIS — D509 Iron deficiency anemia, unspecified: Secondary | ICD-10-CM | POA: Diagnosis not present

## 2018-11-26 ENCOUNTER — Encounter (HOSPITAL_COMMUNITY): Payer: Self-pay | Admitting: Emergency Medicine

## 2018-11-26 ENCOUNTER — Other Ambulatory Visit: Payer: Self-pay

## 2018-11-26 ENCOUNTER — Emergency Department (HOSPITAL_COMMUNITY): Payer: Medicare Other

## 2018-11-26 ENCOUNTER — Observation Stay (HOSPITAL_COMMUNITY)
Admission: EM | Admit: 2018-11-26 | Discharge: 2018-11-28 | Disposition: A | Discharge status: Home / Self Care | Payer: Medicare Other | Attending: Internal Medicine | Admitting: Internal Medicine

## 2018-11-26 DIAGNOSIS — I10 Essential (primary) hypertension: Secondary | ICD-10-CM | POA: Diagnosis present

## 2018-11-26 DIAGNOSIS — Z951 Presence of aortocoronary bypass graft: Secondary | ICD-10-CM | POA: Diagnosis not present

## 2018-11-26 DIAGNOSIS — I130 Hypertensive heart and chronic kidney disease with heart failure and stage 1 through stage 4 chronic kidney disease, or unspecified chronic kidney disease: Secondary | ICD-10-CM | POA: Insufficient documentation

## 2018-11-26 DIAGNOSIS — R2681 Unsteadiness on feet: Secondary | ICD-10-CM | POA: Diagnosis not present

## 2018-11-26 DIAGNOSIS — E785 Hyperlipidemia, unspecified: Secondary | ICD-10-CM | POA: Insufficient documentation

## 2018-11-26 DIAGNOSIS — Z7901 Long term (current) use of anticoagulants: Secondary | ICD-10-CM | POA: Diagnosis not present

## 2018-11-26 DIAGNOSIS — R079 Chest pain, unspecified: Secondary | ICD-10-CM | POA: Diagnosis not present

## 2018-11-26 DIAGNOSIS — K219 Gastro-esophageal reflux disease without esophagitis: Secondary | ICD-10-CM | POA: Insufficient documentation

## 2018-11-26 DIAGNOSIS — I161 Hypertensive emergency: Secondary | ICD-10-CM | POA: Diagnosis not present

## 2018-11-26 DIAGNOSIS — N183 Chronic kidney disease, stage 3 unspecified: Secondary | ICD-10-CM | POA: Diagnosis present

## 2018-11-26 DIAGNOSIS — Z79899 Other long term (current) drug therapy: Secondary | ICD-10-CM | POA: Diagnosis not present

## 2018-11-26 DIAGNOSIS — I272 Pulmonary hypertension, unspecified: Secondary | ICD-10-CM | POA: Insufficient documentation

## 2018-11-26 DIAGNOSIS — R531 Weakness: Secondary | ICD-10-CM | POA: Diagnosis not present

## 2018-11-26 DIAGNOSIS — R072 Precordial pain: Secondary | ICD-10-CM | POA: Diagnosis present

## 2018-11-26 DIAGNOSIS — I63542 Cerebral infarction due to unspecified occlusion or stenosis of left cerebellar artery: Principal | ICD-10-CM | POA: Insufficient documentation

## 2018-11-26 DIAGNOSIS — I480 Paroxysmal atrial fibrillation: Secondary | ICD-10-CM | POA: Insufficient documentation

## 2018-11-26 DIAGNOSIS — I2510 Atherosclerotic heart disease of native coronary artery without angina pectoris: Secondary | ICD-10-CM | POA: Insufficient documentation

## 2018-11-26 DIAGNOSIS — I6522 Occlusion and stenosis of left carotid artery: Secondary | ICD-10-CM | POA: Diagnosis not present

## 2018-11-26 DIAGNOSIS — R0609 Other forms of dyspnea: Secondary | ICD-10-CM | POA: Diagnosis not present

## 2018-11-26 DIAGNOSIS — E039 Hypothyroidism, unspecified: Secondary | ICD-10-CM | POA: Insufficient documentation

## 2018-11-26 DIAGNOSIS — I6501 Occlusion and stenosis of right vertebral artery: Secondary | ICD-10-CM | POA: Diagnosis not present

## 2018-11-26 DIAGNOSIS — M25519 Pain in unspecified shoulder: Secondary | ICD-10-CM | POA: Diagnosis not present

## 2018-11-26 DIAGNOSIS — R0789 Other chest pain: Secondary | ICD-10-CM | POA: Diagnosis not present

## 2018-11-26 DIAGNOSIS — I4510 Unspecified right bundle-branch block: Secondary | ICD-10-CM | POA: Diagnosis not present

## 2018-11-26 DIAGNOSIS — I639 Cerebral infarction, unspecified: Secondary | ICD-10-CM | POA: Diagnosis not present

## 2018-11-26 DIAGNOSIS — H532 Diplopia: Secondary | ICD-10-CM | POA: Diagnosis not present

## 2018-11-26 DIAGNOSIS — I5032 Chronic diastolic (congestive) heart failure: Secondary | ICD-10-CM | POA: Insufficient documentation

## 2018-11-26 LAB — BASIC METABOLIC PANEL
Anion gap: 10 (ref 5–15)
BUN: 10 mg/dL (ref 8–23)
CO2: 22 mmol/L (ref 22–32)
Calcium: 9.1 mg/dL (ref 8.9–10.3)
Chloride: 104 mmol/L (ref 98–111)
Creatinine, Ser: 0.86 mg/dL (ref 0.44–1.00)
GFR calc Af Amer: >60 mL/min (ref >60)
GFR, EST NON AFRICAN AMERICAN: 59 mL/min — AB (ref >60)
Glucose, Bld: 91 mg/dL (ref 70–99)
Potassium: 4.2 mmol/L (ref 3.5–5.1)
Sodium: 136 mmol/L (ref 135–145)

## 2018-11-26 LAB — HEPATIC FUNCTION PANEL
ALT: 18 U/L (ref 0–44)
AST: 32 U/L (ref 15–41)
Albumin: 3.5 g/dL (ref 3.5–5.0)
Alkaline Phosphatase: 65 U/L (ref 38–126)
Bilirubin, Direct: 0.1 mg/dL (ref 0.0–0.2)
Indirect Bilirubin: 0.3 mg/dL (ref 0.3–0.9)
Total Bilirubin: 0.4 mg/dL (ref 0.3–1.2)
Total Protein: 7.3 g/dL (ref 6.5–8.1)

## 2018-11-26 LAB — URINALYSIS, ROUTINE W REFLEX MICROSCOPIC
Bilirubin Urine: NEGATIVE
Glucose, UA: NEGATIVE mg/dL
Hgb urine dipstick: NEGATIVE
KETONES UR: NEGATIVE mg/dL
Leukocytes, UA: NEGATIVE
Nitrite: NEGATIVE
Protein, ur: NEGATIVE mg/dL
SPECIFIC GRAVITY, URINE: 1.005 (ref 1.005–1.030)
pH: 7 (ref 5.0–8.0)

## 2018-11-26 LAB — CBC
HCT: 38.6 % (ref 36.0–46.0)
Hemoglobin: 12.5 g/dL (ref 12.0–15.0)
MCH: 29.3 pg (ref 26.0–34.0)
MCHC: 32.4 g/dL (ref 30.0–36.0)
MCV: 90.4 fL (ref 80.0–100.0)
PLATELETS: 262 10*3/uL (ref 150–400)
RBC: 4.27 MIL/uL (ref 3.87–5.11)
RDW: 23.6 % — AB (ref 11.5–15.5)
WBC: 5.8 10*3/uL (ref 4.0–10.5)
nRBC: 0 % (ref 0.0–0.2)

## 2018-11-26 LAB — BRAIN NATRIURETIC PEPTIDE: B Natriuretic Peptide: 163.1 pg/mL — ABNORMAL HIGH (ref 0.0–100.0)

## 2018-11-26 LAB — PROTIME-INR
INR: 1.42
PROTHROMBIN TIME: 17.2 s — AB (ref 11.4–15.2)

## 2018-11-26 LAB — I-STAT TROPONIN, ED: TROPONIN I, POC: 0.01 ng/mL (ref 0.00–0.08)

## 2018-11-26 LAB — MAGNESIUM: Magnesium: 2.2 mg/dL (ref 1.7–2.4)

## 2018-11-26 LAB — LIPASE, BLOOD: LIPASE: 31 U/L (ref 11–51)

## 2018-11-26 MED ORDER — MAGNESIUM OXIDE 400 (241.3 MG) MG PO TABS
400.0000 mg | ORAL_TABLET | Freq: Every day | ORAL | Status: DC
  Administered 2018-11-27 – 2018-11-28 (×2): 400 mg via ORAL
  Filled 2018-11-26 (×2): qty 1

## 2018-11-26 MED ORDER — HYDRALAZINE HCL 20 MG/ML IJ SOLN: 10.0000 mg | INTRAMUSCULAR | Status: DC | PRN

## 2018-11-26 MED ORDER — STROKE: EARLY STAGES OF RECOVERY BOOK
Freq: Once | Status: AC
  Administered 2018-11-26
  Filled 2018-11-26: qty 1

## 2018-11-26 MED ORDER — IOPAMIDOL (ISOVUE-370) INJECTION 76%
INTRAVENOUS | Status: AC
  Administered 2018-11-26: 80 mL
  Filled 2018-11-26: qty 100

## 2018-11-26 MED ORDER — FUROSEMIDE 20 MG PO TABS
20.0000 mg | ORAL_TABLET | Freq: Every day | ORAL | Status: DC
  Administered 2018-11-27 – 2018-11-28 (×2): 20 mg via ORAL
  Filled 2018-11-26 (×2): qty 1

## 2018-11-26 MED ORDER — MELATONIN 3 MG PO TABS
3.0000 mg | ORAL_TABLET | Freq: Every day | ORAL | Status: DC
  Administered 2018-11-26 – 2018-11-27 (×2): 3 mg via ORAL
  Filled 2018-11-26 (×2): qty 1

## 2018-11-26 MED ORDER — AMIODARONE HCL 200 MG PO TABS
100.0000 mg | ORAL_TABLET | Freq: Every day | ORAL | Status: DC
  Administered 2018-11-27: 100 mg via ORAL
  Filled 2018-11-26 (×2): qty 1

## 2018-11-26 MED ORDER — LABETALOL HCL 5 MG/ML IV SOLN
5.0000 mg | Freq: Once | INTRAVENOUS | Status: AC
  Administered 2018-11-26: 5 mg via INTRAVENOUS
  Filled 2018-11-26: qty 4

## 2018-11-26 MED ORDER — TRAZODONE HCL 50 MG PO TABS: 25.0000 mg | ORAL_TABLET | Freq: Every evening | ORAL | Status: DC | PRN

## 2018-11-26 MED ORDER — AMLODIPINE BESYLATE 5 MG PO TABS: 2.5000 mg | ORAL_TABLET | ORAL | Status: DC

## 2018-11-26 MED ORDER — RIVAROXABAN 15 MG PO TABS: 15.0000 mg | ORAL_TABLET | Freq: Every day | ORAL | Status: DC

## 2018-11-26 MED ORDER — PANTOPRAZOLE SODIUM 40 MG PO TBEC
40.0000 mg | DELAYED_RELEASE_TABLET | Freq: Every day | ORAL | Status: DC
  Administered 2018-11-27 – 2018-11-28 (×2): 40 mg via ORAL
  Filled 2018-11-26 (×2): qty 1

## 2018-11-26 MED ORDER — ACETAMINOPHEN 160 MG/5ML PO SOLN: 650.0000 mg | ORAL | Status: DC | PRN

## 2018-11-26 MED ORDER — LEVOTHYROXINE SODIUM 50 MCG PO TABS
50.0000 ug | ORAL_TABLET | Freq: Every day | ORAL | Status: DC
  Administered 2018-11-27 – 2018-11-28 (×2): 50 ug via ORAL
  Filled 2018-11-26 (×3): qty 1

## 2018-11-26 MED ORDER — ACETAMINOPHEN 650 MG RE SUPP: 650.0000 mg | RECTAL | Status: DC | PRN

## 2018-11-26 MED ORDER — AMLODIPINE BESYLATE 2.5 MG PO TABS
2.5000 mg | ORAL_TABLET | Freq: Every day | ORAL | Status: DC
  Administered 2018-11-27 – 2018-11-28 (×2): 2.5 mg via ORAL
  Filled 2018-11-26 (×2): qty 1

## 2018-11-26 MED ORDER — ACETAMINOPHEN 325 MG PO TABS
650.0000 mg | ORAL_TABLET | ORAL | Status: DC | PRN
  Filled 2018-11-26: qty 2

## 2018-11-26 MED ORDER — HYDRALAZINE HCL 20 MG/ML IJ SOLN
10.0000 mg | Freq: Four times a day (QID) | INTRAMUSCULAR | Status: DC | PRN
  Filled 2018-11-26: qty 1

## 2018-11-26 MED ORDER — SODIUM CHLORIDE 0.9% FLUSH
3.0000 mL | Freq: Once | INTRAVENOUS | Status: AC
  Administered 2018-11-26: 3 mL via INTRAVENOUS

## 2018-11-26 MED ORDER — PSYLLIUM 95 % PO PACK
1.0000 | PACK | Freq: Every day | ORAL | Status: DC
  Filled 2018-11-26 (×2): qty 1

## 2018-11-26 NOTE — ED Notes (Signed)
Ammie Dalton, RN took report from 29W

## 2018-11-26 NOTE — H&P (Addendum)
History and Physical    Maria [name]:[id number] DOB: [birth year] DOA: 11/26/2018  PCP: Merlene Laughter, MD  Patient coming from: Home Chief Complaint: Chest pain left arm numbness and weakness  HPI: Maria [name] is a 85 y.o. female with medical history significant of hypertension, A. fib, carotid artery stenosis, chronic kidney disease, coronary artery disease, status post CABG on Xarelto comes in with several complaints.  She is complaining of left arm numbness and heaviness that is occurred for over 2 days that is better now.  She also complaining of left shoulder pain back pain that is radiating down her left arm that is been going on waxing and waning for 2 months.  She is been checking her blood pressure at home systolics are consistently over 200.  She is not had any medication changes.  She denies any facial weakness numbness or tingling.  She denies any numbness or tingling elsewhere.  She denies any weakness in her legs.  She denies any fevers.  Patient referred for stroke work-up and chest pain.  She is currently not complaining of any chest pain she is having minimal numbness left in her left hand.  Review of Systems: As per HPI otherwise 10 point review of systems negative.   Past Medical History:  Diagnosis Date  . Anemia   . Atonic bladder 12/11/2013  . Atrial fibrillation (HCC)   . Carotid artery occlusion    right carotid stenosis 40-60%, 4/08, neg for stenosis repeat study 11/11  . Celiac disease   . Chronic anticoagulation CHADS2VASC2 score 7 10/18/2014  . Chronic diastolic CHF (congestive heart failure) (HCC)    takes Lasix daily  . CKD (chronic kidney disease), stage III (HCC)   . Coronary artery disease    a.  08/2013 (LIMA-LAD, rSVG to LCX and Lt atrial clip),  . Depression   . DVT (deep venous thrombosis) (HCC) 09/2013  . Gastric ulcer    6-37months ago  . GERD (gastroesophageal reflux disease)    takes Omeprazole daily  . GI bleeding   . Hemorrhoids     . Hot flashes    takes ZOloft 3 times a week  . Hypertension   . Hypothyroidism    takes Synthroid daily as result of AMiodarone  . Insomnia   . Macular degeneration   . Mild aortic insufficiency    a. Echo 10/2013: mild AI.  Marland Kitchen Moderate mitral regurgitation by prior echocardiogram    a. Echo 10/2013.  . Moderate obstructive sleep apnea    uses CPAP;sleep study about 4-20months ago  . MVP (mitral valve prolapse)   . Osteoporosis   . PAF (paroxysmal atrial fibrillation) (HCC)   . Peripheral edema    left  . Presbycusis   . Pulmonary hypertension (HCC)   . RLS (restless legs syndrome)   . Thyroid nodule    7mm, no change 11/11  . Urethral stricture   . Urinary anastomotic stricture   . Urinary retention   . Vitamin D deficiency     Past Surgical History:  Procedure Laterality Date  . APPENDECTOMY    . bilateral cataract surgery    . CARDIAC CATHETERIZATION  08-06-13  . CORONARY ARTERY BYPASS GRAFT N/A 08/28/2013   Procedure: CORONARY ARTERY BYPASS GRAFTING (CABG) x2 using right greater saphenous vein and left internal mammary artery. ;  Surgeon: Delight Ovens, MD;  Location: [facility] OR;  Service: Open Heart Surgery;  Laterality: N/A;  . HIP ARTHROPLASTY Right 12/09/2013   Procedure: ARTHROPLASTY  BIPOLAR HIP CEMENTED;  Surgeon: Nestor Lewandowsky, MD;  Location: [facility] OR;  Service: Orthopedics;  Laterality: Right;  . INTRAOPERATIVE TRANSESOPHAGEAL ECHOCARDIOGRAM N/A 08/28/2013   Procedure: INTRAOPERATIVE TRANSESOPHAGEAL ECHOCARDIOGRAM;  Surgeon: Delight Ovens, MD;  Location: [facility] OR;  Service: Open Heart Surgery;  Laterality: N/A;  . LEFT AND RIGHT HEART CATHETERIZATION WITH CORONARY ANGIOGRAM N/A 08/16/2013   Procedure: LEFT AND RIGHT HEART CATHETERIZATION WITH CORONARY ANGIOGRAM;  Surgeon: Lesleigh Noe, MD;  Location: [facility] CATH LAB;  Service: Cardiovascular;  Laterality: N/A;  . MANDIBLE SURGERY    . TCS    . TONSILLECTOMY    . trigger thumb    . TUBAL LIGATION       reports  that she has never smoked. She has never used smokeless tobacco. She reports that she does not drink alcohol or use drugs.  Allergies  Allergen Reactions  . Alendronate Sodium Other (See Comments)    Jaw pain  . Amoxicillin Other (See Comments)    Nausea, dizziness Nausea, dizziness  . Codeine Nausea And Vomiting  . Fosamax [Alendronate Sodium] Other (See Comments)    Jaw pain  . Gluten Meal Other (See Comments)    Celiac Disease Celiac Disease  . Hydrochlorothiazide Other (See Comments)    Hyponatremia, GI upset Hyponatremia, GI upset  . Tetanus Toxoid, Adsorbed Other (See Comments)    Bad local reaction  . Tetanus Toxoids Other (See Comments)    Bad local reaction    Family History  Problem Relation Age of Onset  . Heart attack Mother   . CVA Mother   . CAD Father   . Colon cancer Father   . Heart attack Father   . Heart attack Brother   . Peripheral vascular disease Brother   . AAA (abdominal aortic aneurysm) Brother     Prior to Admission medications   Medication Sig Start Date End Date Taking? Authorizing Provider  acetaminophen (TYLENOL) 500 MG tablet Take 1,000 mg by mouth every 6 (six) hours as needed.   Yes [provider]  amiodarone (PACERONE) 100 MG tablet Take 100 mg by mouth daily. 08/14/15  Yes [provider]  amLODipine (NORVASC) 2.5 MG tablet Take 2.5 mg by mouth 3 (three) times a week. Tuesdays, Thursdays and Saturdays 05/02/17  Yes [provider]  furosemide (LASIX) 20 MG tablet Take 20 mg by mouth daily.   Yes [provider]  levothyroxine (SYNTHROID, LEVOTHROID) 50 MCG tablet Take 1 tablet (50 mcg total) by mouth daily before breakfast. 01/03/14  Yes Angiulli, Mcarthur Rossetti, PA-C  Magnesium 250 MG TABS Take 250 mg by mouth daily.   Yes [provider]  Melatonin CR 3 MG TBCR Take 3 mg by mouth at bedtime.   Yes [provider]  MULTIPLE VITAMIN PO Take 1 tablet by mouth daily.   Yes [provider]  pantoprazole (PROTONIX) 40 MG tablet Take 40 mg by mouth daily. 09/13/18  Yes [provider]  Rivaroxaban (XARELTO) 15 MG TABS tablet TAKE ONE TABLET BY MOUTH ONCE DAILY WITH SUPPER Patient taking differently: Take 15 mg by mouth daily.  05/16/17  Yes Lyn Records, MD  traZODone (DESYREL) 50 MG tablet Take 25 mg by mouth at bedtime as needed for sleep.   Yes [provider]  Wheat Dextrin (BENEFIBER) POWD Take 1 Scoop by mouth daily.   Yes [provider]    Physical Exam: Vitals:   11/26/18 1600 11/26/18 1615 11/26/18 1900 11/26/18 2000  BP: (!) 145/96  (!) 169/65 (!) 157/66  Pulse: 68 72 65 66  Resp: 20 (!) 24 19 16   Temp:      TempSrc:      SpO2: 96% 96% 97% 96%  Weight:      Height:          Constitutional: NAD, calm, comfortable Vitals:   11/26/18 1600 11/26/18 1615 11/26/18 1900 11/26/18 2000  BP: (!) 145/96  (!) 169/65 (!) 157/66  Pulse: 68 72 65 66  Resp: 20 (!) 24 19 16   Temp:      TempSrc:      SpO2: 96% 96% 97% 96%  Weight:      Height:       Eyes: PERRL, lids and conjunctivae normal ENMT: Mucous membranes are moist. Posterior pharynx clear of any exudate or lesions.Normal dentition.  Neck: normal, supple, no masses, no thyromegaly Respiratory: clear to auscultation bilaterally, no wheezing, no crackles. Normal respiratory effort. No accessory muscle use.  Cardiovascular: Regular rate and rhythm, no murmurs / rubs / gallops. No extremity edema. 2+ pedal pulses. No carotid bruits.  Abdomen: no tenderness, no masses palpated. No hepatosplenomegaly. Bowel sounds positive.  Musculoskeletal: no clubbing / cyanosis. No joint deformity upper and lower extremities. Good ROM, no contractures. Normal muscle tone.  Skin: no rashes, lesions, ulcers. No induration Neurologic: CN 2-12 grossly intact. Sensation intact, DTR normal. Strength 5/5 in all 4.  Psychiatric: Normal judgment and insight. Alert and oriented x 3. Normal mood.      Labs on Admission: I have personally reviewed following labs and imaging studies  CBC: Recent Labs  Lab 11/26/18 1521  WBC 5.8  HGB 12.5  HCT 38.6  MCV 90.4  PLT 262   Basic Metabolic Panel: Recent Labs  Lab 11/26/18 1521  NA 136  K 4.2  CL 104  CO2 22  GLUCOSE 91  BUN 10  CREATININE 0.86  CALCIUM 9.1  MG 2.2   GFR: Estimated Creatinine Clearance: 30.8 mL/min (by C-G formula based on SCr of 0.86 mg/dL). Liver Function Tests: Recent Labs  Lab 11/26/18 1521  AST 32  ALT 18  ALKPHOS 65  BILITOT 0.4  PROT 7.3  ALBUMIN 3.5   Recent Labs  Lab 11/26/18 1521  LIPASE 31   No results for input(s): AMMONIA in the last 168 hours. Coagulation Profile: Recent Labs  Lab 11/26/18 1521  INR 1.42   Cardiac Enzymes: No results for input(s): CKTOTAL, CKMB, CKMBINDEX, TROPONINI in the last 168 hours. BNP (last 3 results) No results for input(s): PROBNP in the last 8760 hours. HbA1C: No results for input(s): HGBA1C in the last 72 hours. CBG: No results for input(s): GLUCAP in the last 168 hours. Lipid Profile: No results for input(s): CHOL, HDL, LDLCALC, TRIG, CHOLHDL, LDLDIRECT in the last 72 hours. Thyroid Function Tests: No results for input(s): TSH, T4TOTAL, FREET4, T3FREE, THYROIDAB in the last 72 hours. Anemia Panel: No results for input(s): VITAMINB12, FOLATE, FERRITIN, TIBC, IRON, RETICCTPCT in the last 72 hours. Urine analysis:    Component Value Date/Time   COLORURINE YELLOW 11/26/2018 1634   APPEARANCEUR CLEAR 11/26/2018 1634   LABSPEC 1.005 11/26/2018 1634   PHURINE 7.0 11/26/2018 1634   GLUCOSEU NEGATIVE 11/26/2018 1634   HGBUR NEGATIVE 11/26/2018 1634   BILIRUBINUR NEGATIVE 11/26/2018 1634   KETONESUR NEGATIVE 11/26/2018 1634   PROTEINUR NEGATIVE 11/26/2018 1634   UROBILINOGEN 0.2 06/09/2015 2120   NITRITE NEGATIVE 11/26/2018 1634   LEUKOCYTESUR NEGATIVE 11/26/2018 1634   Sepsis  Labs:  !!!!!!!!!!!!!!!!!!!!!!!!!!!!!!!!!!!!!!!!!!!! @LABRCNTIP (procalcitonin:4,lacticidven:4) ) Recent Results (from the past 240 hour(s))  Urine culture     Status: None (Preliminary result)   Collection Time: 11/26/18  4:34 PM  Result Value Ref Range Status   Specimen Description URINE, CLEAN CATCH  Final   Special Requests   Final    NONE Performed at [facility], [address] [place], [place] [address]    Culture PENDING  Incomplete   Report Status PENDING  Incomplete     Radiological Exams on Admission: Ct Angio Head W Or Wo Contrast  Result Date: 11/26/2018 CLINICAL DATA:  85 year old female with headache, dizziness, vertical diplopia, chest pain, back pain, hypertensive. EXAM: CT ANGIOGRAPHY HEAD AND NECK TECHNIQUE: Multidetector CT imaging of the head and neck was performed using the standard protocol during bolus administration of intravenous contrast. Multiplanar CT image reconstructions and MIPs were obtained to evaluate the vascular anatomy. Carotid stenosis measurements (when applicable) are obtained utilizing NASCET criteria, using the distal internal carotid diameter as the denominator. CONTRAST:  80mL ISOVUE-370 IOPAMIDOL (ISOVUE-370) INJECTION 76% COMPARISON:  Chest CTA today reported separately. Head face and cervical spine CTs 06/16/2016 and 03/02/2016. Brain MRI 01/16/2016. FINDINGS: CT HEAD Brain: Stable cerebral volume with no intracranial mass effect. No acute intracranial hemorrhage identified. No ventriculomegaly. Small chronic lacune in the left thalamus is stable. Elsewhere stable and normal for age gray-white matter differentiation. No cortically based acute infarct identified. Calvarium and skull base: Osteopenia. No acute osseous abnormality identified. Paranasal sinuses: Visualized paranasal sinuses and mastoids are stable and well pneumatized. Tympanic cavities remain clear. Orbits: No acute orbit or scalp soft tissue finding. CTA NECK Skeleton: Prior sternotomy.  Osteopenia. No acute osseous abnormality in the head or neck. Upper chest: Chest CTA reported separately today. Other neck: Negative; no neck mass or lymphadenopathy. Aortic arch: Calcified aortic atherosclerosis. Three vessel arch configuration. Right carotid system: Mildly tortuous brachiocephalic artery and proximal right CCA without stenosis. Minimal right CCA plaque without stenosis proximal to the bifurcation. Calcified plaque at the posterior right ICA origin and bulb without stenosis. Negative cervical right ICA distal to the bulb. Left carotid system: Normal left CCA origin. Mildly tortuous proximal left CCA. No plaque proximal to the bifurcation. Soft and calcified plaque at the left ICA origin and bulb resulting in less than 50 % stenosis with respect to the distal vessel. Otherwise minimal cervical left ICA mural irregularity without stenosis to the skull base. Vertebral arteries: Soft and calcified plaque in the proximal right subclavian artery resulting in 65% stenosis proximal to the right vertebral artery origin. Calcified plaque about the right vertebral origin but no associated proximal right vertebral stenosis. Patent right vertebral artery to the skull base with mild tortuosity, no stenosis. Mild proximal left subclavian artery plaque without stenosis. Calcified plaque near the left vertebral artery origin but no origin stenosis. Tortuous left V1 segment. Patent left vertebral artery to the skull base without stenosis. CTA HEAD Posterior circulation: Bulky calcified plaque in the right V4 segment with moderate to severe stenosis (series 13, image 144). This is proximal to the right PICA origin which remains patent. No other right vertebral stenosis to the vertebrobasilar junction. Contralateral proximal left V4 calcified plaque with only mild stenosis. Normal left PICA origin and vertebrobasilar junction. Patent basilar artery without stenosis. Normal SCA and left PCA origins. Fetal type right  PCA origin. Bilateral PCA branches are within normal limits. Anterior circulation:  Both ICA siphons are patent. On the left there is moderate  calcified plaque through the cavernous segment and bulky supraclinoid calcified plaque with severe stenosis just distal to the left ophthalmic artery origin (series 13, image 105). The left carotid terminus remains patent. On the right there is mild to moderate calcified plaque without stenosis. Normal right ophthalmic and posterior communicating artery origins. Patent carotid termini. Normal MCA and ACA origins. Dominant right A1 segment. Anterior communicating artery and bilateral ACA branches are within normal limits. Left MCA M1 segment, bifurcation, and left MCA branches are within normal limits. Right MCA M1 segment, bifurcation, and right MCA branches are within normal limits. Venous sinuses: Patent on the delayed images. Anatomic variants: Fetal type right PCA origin. Dominant right ACA A1 segment. Delayed phase: No abnormal enhancement identified. Review of the MIP images confirms the above findings IMPRESSION: 1. Negative for large vessel occlusion or arterial dissection in the head or neck. 2. Positive for Severe stenoses of the distal Left ICA Siphon and the distal Right Vertebral Artery due to bulky calcified plaque. 3. Bulky plaque also in the proximal Right Subclavian Artery with 65% stenosis. Cervical carotid atherosclerosis without stenosis. 4. Stable CT appearance of the brain since 2017 with no acute intracranial abnormality. 5. CTA chest today reported separately. Electronically Signed   By: Odessa Fleming M.D.   On: 11/26/2018 18:04   Dg Chest 2 View  Result Date: 11/26/2018 CLINICAL DATA:  Pt here from independent living. Pt has been having chest pain with left arm numbness since Oct 11, 2023. No chest pain currently but her left arm has felt "dead" since 2023/10/11. She feels decreased sensation to the left hand. Endorses shortness of breath with exertion. Mild  headache to left side. EXAM: CHEST - 2 VIEW COMPARISON:  01/05/2017 FINDINGS: Stable changes from prior CABG surgery. Cardiac silhouette top-normal in size. No mediastinal or hilar masses. No evidence of adenopathy. Lungs are hyperexpanded. There are prominent bronchovascular markings. Linear scarring noted at the left lung base. No evidence of pneumonia or pulmonary edema. No pleural effusion or pneumothorax. Chronic lower thoracic spine mild compression fracture. Skeletal structures are diffusely demineralized. IMPRESSION: 1. No acute cardiopulmonary disease. 2. Stable changes from prior CABG surgery. 3. Lung hyperexpansion consistent with COPD. Electronically Signed   By: Amie Portland M.D.   On: 11/26/2018 16:26   Ct Angio Neck W And/or Wo Contrast  Result Date: 11/26/2018 CLINICAL DATA:  85 year old female with headache, dizziness, vertical diplopia, chest pain, back pain, hypertensive. EXAM: CT ANGIOGRAPHY HEAD AND NECK TECHNIQUE: Multidetector CT imaging of the head and neck was performed using the standard protocol during bolus administration of intravenous contrast. Multiplanar CT image reconstructions and MIPs were obtained to evaluate the vascular anatomy. Carotid stenosis measurements (when applicable) are obtained utilizing NASCET criteria, using the distal internal carotid diameter as the denominator. CONTRAST:  80mL ISOVUE-370 IOPAMIDOL (ISOVUE-370) INJECTION 76% COMPARISON:  Chest CTA today reported separately. Head face and cervical spine CTs 06/16/2016 and 03/02/2016. Brain MRI 01/16/2016. FINDINGS: CT HEAD Brain: Stable cerebral volume with no intracranial mass effect. No acute intracranial hemorrhage identified. No ventriculomegaly. Small chronic lacune in the left thalamus is stable. Elsewhere stable and normal for age gray-white matter differentiation. No cortically based acute infarct identified. Calvarium and skull base: Osteopenia. No acute osseous abnormality identified. Paranasal  sinuses: Visualized paranasal sinuses and mastoids are stable and well pneumatized. Tympanic cavities remain clear. Orbits: No acute orbit or scalp soft tissue finding. CTA NECK Skeleton: Prior sternotomy. Osteopenia. No acute osseous abnormality in the head or neck. Upper chest: Chest  CTA reported separately today. Other neck: Negative; no neck mass or lymphadenopathy. Aortic arch: Calcified aortic atherosclerosis. Three vessel arch configuration. Right carotid system: Mildly tortuous brachiocephalic artery and proximal right CCA without stenosis. Minimal right CCA plaque without stenosis proximal to the bifurcation. Calcified plaque at the posterior right ICA origin and bulb without stenosis. Negative cervical right ICA distal to the bulb. Left carotid system: Normal left CCA origin. Mildly tortuous proximal left CCA. No plaque proximal to the bifurcation. Soft and calcified plaque at the left ICA origin and bulb resulting in less than 50 % stenosis with respect to the distal vessel. Otherwise minimal cervical left ICA mural irregularity without stenosis to the skull base. Vertebral arteries: Soft and calcified plaque in the proximal right subclavian artery resulting in 65% stenosis proximal to the right vertebral artery origin. Calcified plaque about the right vertebral origin but no associated proximal right vertebral stenosis. Patent right vertebral artery to the skull base with mild tortuosity, no stenosis. Mild proximal left subclavian artery plaque without stenosis. Calcified plaque near the left vertebral artery origin but no origin stenosis. Tortuous left V1 segment. Patent left vertebral artery to the skull base without stenosis. CTA HEAD Posterior circulation: Bulky calcified plaque in the right V4 segment with moderate to severe stenosis (series 13, image 144). This is proximal to the right PICA origin which remains patent. No other right vertebral stenosis to the vertebrobasilar junction. Contralateral  proximal left V4 calcified plaque with only mild stenosis. Normal left PICA origin and vertebrobasilar junction. Patent basilar artery without stenosis. Normal SCA and left PCA origins. Fetal type right PCA origin. Bilateral PCA branches are within normal limits. Anterior circulation:  Both ICA siphons are patent. On the left there is moderate calcified plaque through the cavernous segment and bulky supraclinoid calcified plaque with severe stenosis just distal to the left ophthalmic artery origin (series 13, image 105). The left carotid terminus remains patent. On the right there is mild to moderate calcified plaque without stenosis. Normal right ophthalmic and posterior communicating artery origins. Patent carotid termini. Normal MCA and ACA origins. Dominant right A1 segment. Anterior communicating artery and bilateral ACA branches are within normal limits. Left MCA M1 segment, bifurcation, and left MCA branches are within normal limits. Right MCA M1 segment, bifurcation, and right MCA branches are within normal limits. Venous sinuses: Patent on the delayed images. Anatomic variants: Fetal type right PCA origin. Dominant right ACA A1 segment. Delayed phase: No abnormal enhancement identified. Review of the MIP images confirms the above findings IMPRESSION: 1. Negative for large vessel occlusion or arterial dissection in the head or neck. 2. Positive for Severe stenoses of the distal Left ICA Siphon and the distal Right Vertebral Artery due to bulky calcified plaque. 3. Bulky plaque also in the proximal Right Subclavian Artery with 65% stenosis. Cervical carotid atherosclerosis without stenosis. 4. Stable CT appearance of the brain since 2017 with no acute intracranial abnormality. 5. CTA chest today reported separately. Electronically Signed   By: Odessa Fleming M.D.   On: 11/26/2018 18:04   Ct Angio Chest Aorta W And/or Wo Contrast  Result Date: 11/26/2018 CLINICAL DATA:  85 year old female with acute chest pain  EXAM: CT ANGIOGRAPHY CHEST WITH CONTRAST TECHNIQUE: Multidetector CT imaging of the chest was performed using the standard protocol during bolus administration of intravenous contrast. Multiplanar CT image reconstructions and MIPs were obtained to evaluate the vascular anatomy. CONTRAST:  80mL ISOVUE-370 IOPAMIDOL (ISOVUE-370) INJECTION 76% COMPARISON:  11/26/2018 chest radiograph and prior  studies FINDINGS: Cardiovascular: Cardiomegaly, CABG changes and LEFT atrial appendage clip again noted. Coronary artery and aortic atherosclerotic calcifications are identified without evidence of thoracic aortic aneurysm or dissection. No pulmonary emboli or pericardial effusion identified. Mediastinum/Nodes: No enlarged mediastinal, hilar, or axillary lymph nodes. Thyroid gland, trachea, and esophagus demonstrate no significant findings. Lungs/Pleura: Mild bilateral ground-glass opacities in a mosaic type pattern probably represents small airway disease or possibly small vessel disease. No airspace disease, consolidation, mass, nodule, pleural effusion or pneumothorax noted. Mild bibasilar atelectasis/scarring noted. Upper Abdomen: No acute abnormality. Musculoskeletal: No acute or suspicious bony abnormalities. T10 compression fracture is unchanged. Review of the MIP images confirms the above findings. IMPRESSION: 1. No evidence of thoracic aortic aneurysm/dissection or pulmonary emboli. 2. Cardiomegaly and CABG changes. 3. Mild bilateral ground-glass opacities in a mosaic type pattern probably representing small airway disease. Mild bibasilar atelectasis/scarring 4.  Aortic Atherosclerosis (ICD10-I70.0). Electronically Signed   By: Harmon Pier M.D.   On: 11/26/2018 18:00    EKG: Independently reviewed.  Normal sinus rhythm right bundle branch block old Case discussed with Dr. Rush Landmark in the ED Old chart reviewed   Assessment/Plan 85 year old female with uncontrolled hypertension comes in with strokelike symptoms and  chest pain Principal Problem:   CVA (cerebral vascular accident) (HCC)-neurology consulted.  Continue Xarelto for now.  Obtain MRI of the brain for definitive diagnosis and cardiac echo.  Check fasting lipid panel and hemoglobin A1c.  Observe on telemetry.  Frequent neurological checks.  Active Problems:   Chest pain-serial troponin and check cardiac echo in the morning    Essential hypertension, benign-PRN order for hydralazine for systolic over 190 diastolic over 110 allow permissive hypertension for now    Paroxysmal atrial fibrillation (HCC)-currently normal sinus rhythm continue Xarelto    Pulmonary hypertension, moderate to severe (HCC)-noted    CKD (chronic kidney disease), stage III (HCC)-stable at baseline     DVT prophylaxis: Xarelto Code Status: DNR Family Communication: Daughter Disposition Plan: 1 to 2 days Consults called: Neurology Admission status: Observation   Imoni Kohen A MD [facility]  If 7PM-7AM, please contact night-coverage [email or web address] Password [facility]  11/26/2018, 9:11 PM    Called by neurology team Dr. Jerrell Belfast felt this is likely most due to hypertensive urgency rather than stroke.  Will adjust blood pressure medications for goal of systolic 140 range instead of allowing for permissive hypertension.  Patient on Norvasc 3 times a week of move this to daily and changed her parameters for PRN hydralazine.  May need to add another agent in the morning if blood pressures remain persistently high.

## 2018-11-26 NOTE — ED Triage Notes (Addendum)
Per EMS- pt here from independent living. Pt has been having chest pain with left arm numbness since 12/31/22. No chest pain currently but her left arm has felt "dead" since 2022/12/31. She feels decreased sensation to the left hand. Endorses shortness of breath with exertion. Mild headache to left side. 20G PIV left wrist placed. 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 Alert and oriented X4.

## 2018-11-26 NOTE — ED Notes (Signed)
Attempted to call report, RN will return call

## 2018-11-26 NOTE — ED Provider Notes (Signed)
[facility] EMERGENCY DEPARTMENT Provider Note   CSN: [id number] Arrival date & time: 11/26/18  1509     History   Chief Complaint Chief Complaint  Patient presents with  . Chest Pain    HPI SHERLYNE CROWNOVER is a 85 y.o. female.  The history is provided by the patient, a relative and medical records. No language interpreter was used.  Chest Pain  Pain location:  Substernal area Pain quality: dull and pressure   Pain radiates to:  L shoulder, L arm and upper back Pain severity:  Severe Onset quality:  Gradual Duration:  4 days Timing:  Intermittent Progression:  Waxing and waning Chronicity:  New Context: at rest   Worsened by:  Nothing Ineffective treatments:  None tried Associated symptoms: back pain, diaphoresis, headache, nausea, numbness, shortness of breath and weakness   Associated symptoms: no abdominal pain, no anxiety, no cough, no dizziness, no fatigue, no fever, no lower extremity edema, no palpitations and no vomiting   Risk factors: coronary artery disease and prior DVT/PE     Past Medical History:  Diagnosis Date  . Anemia   . Atonic bladder 12/11/2013  . Atrial fibrillation ([place])   . Carotid artery occlusion    right carotid stenosis 40-60%, 4/08, neg for stenosis repeat study 11/11  . Celiac disease   . Chronic anticoagulation CHADS2VASC2 score 7 10/18/2014  . Chronic diastolic CHF (congestive heart failure) (HCC)    takes Lasix daily  . CKD (chronic kidney disease), stage III ([place])   . Coronary artery disease    a.  08/2013 (LIMA-LAD, rSVG to LCX and Lt atrial clip),  . Depression   . DVT (deep venous thrombosis) ([place]) 09/2013  . Gastric ulcer    6-63month ago  . GERD (gastroesophageal reflux disease)    takes Omeprazole daily  . GI bleeding   . Hemorrhoids   . Hot flashes    takes ZOloft 3 times a week  . Hypertension   . Hypothyroidism    takes Synthroid daily as result of AMiodarone  . Insomnia   . Macular  degeneration   . Mild aortic insufficiency    a. Echo 10/2013: mild AI.  .Marland KitchenModerate mitral regurgitation by prior echocardiogram    a. Echo 10/2013.  . Moderate obstructive sleep apnea    uses CPAP;sleep study about 4-556monthago  . MVP (mitral valve prolapse)   . Osteoporosis   . PAF (paroxysmal atrial fibrillation) (H[place]  . Peripheral edema    left  . Presbycusis   . Pulmonary hypertension (HCMarine  . RLS (restless legs syndrome)   . Thyroid nodule    59m75mno change 11/11  . Urethral stricture   . Urinary anastomotic stricture   . Urinary retention   . Vitamin D deficiency     Patient Active Problem List   Diagnosis Date Noted  . On amiodarone therapy 07/13/2017  . Frailty 07/13/2017  . CKD (chronic kidney disease), stage III (HCCMargaret . Hypothyroidism 10/18/2014  . Chronic anticoagulation CHADS2VASC2 score 7 10/18/2014  . Right leg pain 07/22/2014  . Gout of foot 04/10/2014  . Atonic bladder 12/11/2013  . Closed right hip fracture (HCCMcVille2/13/2015  . Protein-calorie malnutrition, severe (HCCBaskerville2/08/2013  . DVT (deep venous thrombosis) (HCCWaubay2/07/2013  . Coronary artery disease involving coronary bypass graft of native heart with angina pectoris (HCCRose Hill1/19/2014  . Mitral regurgitation 08/07/2013  . Pulmonary hypertension, moderate to severe (HCCFellsburg0/14/2014  .  Essential hypertension, benign 08/02/2013  . Nontoxic uninodular goiter 08/02/2013  . Unspecified hereditary and idiopathic peripheral neuropathy 08/02/2013  . Carotid disease, bilateral ([place]) 08/02/2013  . Unspecified vitamin D deficiency 08/02/2013  . Depressive disorder, not elsewhere classified 08/02/2013  . Paroxysmal atrial fibrillation ([place]) 08/02/2013  . Trifascicular block 08/02/2013  . Gastric ulcer, unspecified as acute or chronic, without mention of hemorrhage, perforation, or obstruction 08/02/2013  . Sinoatrial node dysfunction ([place]) 08/02/2013    Past Surgical History:  Procedure Laterality Date   . APPENDECTOMY    . bilateral cataract surgery    . CARDIAC CATHETERIZATION  08-06-13  . CORONARY ARTERY BYPASS GRAFT N/A 08/28/2013   Procedure: CORONARY ARTERY BYPASS GRAFTING (CABG) x2 using right greater saphenous vein and left internal mammary artery. ;  Surgeon: Grace Isaac, MD;  Location: [facility];  Service: Open Heart Surgery;  Laterality: N/A;  . HIP ARTHROPLASTY Right 12/09/2013   Procedure: ARTHROPLASTY BIPOLAR HIP CEMENTED;  Surgeon: Kerin Salen, MD;  Location: [facility];  Service: Orthopedics;  Laterality: Right;  . INTRAOPERATIVE TRANSESOPHAGEAL ECHOCARDIOGRAM N/A 08/28/2013   Procedure: INTRAOPERATIVE TRANSESOPHAGEAL ECHOCARDIOGRAM;  Surgeon: Grace Isaac, MD;  Location: [facility];  Service: Open Heart Surgery;  Laterality: N/A;  . LEFT AND RIGHT HEART CATHETERIZATION WITH CORONARY ANGIOGRAM N/A 08/16/2013   Procedure: LEFT AND RIGHT HEART CATHETERIZATION WITH CORONARY ANGIOGRAM;  Surgeon: [name], MD;  Location: [facility] C[facility] LAB;  Service: Cardiovascular;  Laterality: N/A;  . MANDIBLE SURGERY    . TCS    . TONSILLECTOMY    . trigger thumb    . TUBAL LIGATION       OB History   No obstetric history on file.      Home Medications    Prior to Admission medications   Medication Sig Start Date End Date Taking? Authorizing Provider  acetaminophen (TYLENOL) 500 MG tablet Take 1,000 mg by mouth every 6 (six) hours as needed.    [provider]  amiodarone (PACERONE) 100 MG tablet Take 100 mg by mouth daily. 08/14/15   [provider]  amLODipine (NORVASC) 2.5 MG tablet Take 2.5 mg by mouth 3 (three) times a week. (Monday, Wednesday, and Friday) 05/02/17   [provider]  furosemide (LASIX) 20 MG tablet Take 20 mg by mouth daily.    [provider]  levothyroxine (SYNTHROID, LEVOTHROID) 50 MCG tablet Take 1 tablet (50 mcg total) by mouth daily before breakfast. 01/03/14   Angiulli, Lavon Paganini, PA-C  Magnesium 250 MG TABS Take 250 mg by  mouth daily.    [provider]  Melatonin CR 3 MG TBCR Take 3 mg by mouth at bedtime.    [provider]  MULTIPLE VITAMIN PO Take 1 tablet by mouth daily.    [provider]  polyethylene glycol (MIRALAX / GLYCOLAX) packet Take 17 g by mouth daily.    [provider]  Rivaroxaban (XARELTO) 15 MG TABS tablet TAKE ONE TABLET BY MOUTH ONCE DAILY WITH SUPPER 05/16/17   Belva Crome, MD  traZODone (DESYREL) 50 MG tablet Take 25 mg by mouth at bedtime as needed for sleep.    [provider]    Family History Family History  Problem Relation Age of Onset  . Heart attack Mother   . CVA Mother   . CAD Father   . Colon cancer Father   . Heart attack Father   . Heart attack Brother   . Peripheral vascular disease Brother   .  AAA (abdominal aortic aneurysm) Brother     Social History Social History   Tobacco Use  . Smoking status: Never Smoker  . Smokeless tobacco: Never Used  Substance Use Topics  . Alcohol use: No  . Drug use: No     Allergies   Alendronate sodium; Amoxicillin; Codeine; Fosamax [alendronate sodium]; Gluten meal; Hydrochlorothiazide; Tetanus toxoid, adsorbed; and Tetanus toxoids   Review of Systems Review of Systems  Constitutional: Positive for diaphoresis. Negative for chills, fatigue and fever.  HENT: Negative for congestion and rhinorrhea.   Eyes: Positive for visual disturbance.  Respiratory: Positive for shortness of breath. Negative for cough, chest tightness and wheezing.   Cardiovascular: Positive for chest pain. Negative for palpitations.  Gastrointestinal: Positive for nausea. Negative for abdominal pain and vomiting.       Chronic GI problems with celiac   Genitourinary: Negative for dysuria, flank pain and frequency.  Musculoskeletal: Positive for back pain. Negative for arthralgias, neck pain and neck stiffness.  Skin: Negative for rash and wound.  Neurological: Positive for weakness, numbness and  headaches. Negative for dizziness, facial asymmetry, speech difficulty and light-headedness.  Psychiatric/Behavioral: Negative for agitation.  All other systems reviewed and are negative.    Physical Exam Updated Vital Signs BP (!) 173/66   Pulse 68   Temp 97.6 F (36.4 C) (Oral)   Resp 15   Ht 5' 1"  (1.549 m)   Wt 46.7 kg   SpO2 100%   BMI 19.46 kg/m   Physical Exam Vitals signs and nursing note reviewed.  Constitutional:      General: She is not in acute distress.    Appearance: She is well-developed. She is not ill-appearing, toxic-appearing or diaphoretic.  HENT:     Head: Normocephalic and atraumatic.  Eyes:     Conjunctiva/sclera: Conjunctivae normal.     Pupils: Pupils are equal, round, and reactive to light.  Neck:     Musculoskeletal: Normal range of motion and neck supple.  Cardiovascular:     Rate and Rhythm: Normal rate and regular rhythm.     Heart sounds: Murmur present. Systolic murmur present.  Pulmonary:     Effort: Pulmonary effort is normal. No respiratory distress.     Breath sounds: Normal breath sounds. No decreased breath sounds, wheezing, rhonchi or rales.  Chest:     Chest wall: No tenderness.  Abdominal:     Palpations: Abdomen is soft.     Tenderness: There is no abdominal tenderness.  Musculoskeletal:     Right lower leg: She exhibits no tenderness. No edema.     Left lower leg: She exhibits no tenderness. No edema.  Skin:    General: Skin is warm and dry.     Capillary Refill: Capillary refill takes less than 2 seconds.     Coloration: Skin is not cyanotic.  Neurological:     General: No focal deficit present.     Mental Status: She is alert and oriented to person, place, and time.     GCS: GCS eye subscore is 4. GCS verbal subscore is 5. GCS motor subscore is 6.     Cranial Nerves: No cranial nerve deficit, dysarthria or facial asymmetry.     Sensory: Sensation is intact. No sensory deficit.     Motor: Motor function is intact. No  weakness.     Coordination: Finger-Nose-Finger Test normal.  Psychiatric:        Mood and Affect: Mood normal.      ED Treatments /  Results  Labs (all labs ordered are listed, but only abnormal results are displayed) Labs Reviewed  BASIC METABOLIC PANEL - Abnormal; Notable for the following components:      Result Value   GFR calc non Af Amer 59 (*)    All other components within normal limits  CBC - Abnormal; Notable for the following components:   RDW 23.6 (*)    All other components within normal limits  PROTIME-INR - Abnormal; Notable for the following components:   Prothrombin Time 17.2 (*)    All other components within normal limits  BRAIN NATRIURETIC PEPTIDE - Abnormal; Notable for the following components:   B Natriuretic Peptide 163.1 (*)    All other components within normal limits  URINE CULTURE  HEPATIC FUNCTION PANEL  LIPASE, BLOOD  MAGNESIUM  URINALYSIS, ROUTINE W REFLEX MICROSCOPIC  TROPONIN I  HEMOGLOBIN A1C  LIPID PANEL  TROPONIN I  TROPONIN I  I-STAT TROPONIN, ED    EKG EKG Interpretation  Date/Time:  Sunday November 26 2018 15:11:54 EST Ventricular Rate:  69 PR Interval:    QRS Duration: 134 QT Interval:  434 QTC Calculation: 465 R Axis:   -67 Text Interpretation:  Sinus rhythm Short PR interval Right bundle branch block LVH with IVCD and secondary repol abnrm Inferior infarct, old When compared to prior, t wave now inverted in lead V3.  No STEMI Confirmed by Antony Blackbird [phone]) on 11/26/2018 3:15:27 PM   Radiology Ct Angio Head W Or Wo Contrast  Result Date: 11/26/2018 CLINICAL DATA:  85 year old female with headache, dizziness, vertical diplopia, chest pain, back pain, hypertensive. EXAM: CT ANGIOGRAPHY HEAD AND NECK TECHNIQUE: Multidetector CT imaging of the head and neck was performed using the standard protocol during bolus administration of intravenous contrast. Multiplanar CT image reconstructions and MIPs were obtained to evaluate the  vascular anatomy. Carotid stenosis measurements (when applicable) are obtained utilizing NASCET criteria, using the distal internal carotid diameter as the denominator. CONTRAST:  17m ISOVUE-370 IOPAMIDOL (ISOVUE-370) INJECTION 76% COMPARISON:  Chest CTA today reported separately. Head face and cervical spine CTs 06/16/2016 and 03/02/2016. Brain MRI 01/16/2016. FINDINGS: CT HEAD Brain: Stable cerebral volume with no intracranial mass effect. No acute intracranial hemorrhage identified. No ventriculomegaly. Small chronic lacune in the left thalamus is stable. Elsewhere stable and normal for age gray-white matter differentiation. No cortically based acute infarct identified. Calvarium and skull base: Osteopenia. No acute osseous abnormality identified. Paranasal sinuses: Visualized paranasal sinuses and mastoids are stable and well pneumatized. Tympanic cavities remain clear. Orbits: No acute orbit or scalp soft tissue finding. CTA NECK Skeleton: Prior sternotomy. Osteopenia. No acute osseous abnormality in the head or neck. Upper chest: Chest CTA reported separately today. Other neck: Negative; no neck mass or lymphadenopathy. Aortic arch: Calcified aortic atherosclerosis. Three vessel arch configuration. Right carotid system: Mildly tortuous brachiocephalic artery and proximal right CCA without stenosis. Minimal right CCA plaque without stenosis proximal to the bifurcation. Calcified plaque at the posterior right ICA origin and bulb without stenosis. Negative cervical right ICA distal to the bulb. Left carotid system: Normal left CCA origin. Mildly tortuous proximal left CCA. No plaque proximal to the bifurcation. Soft and calcified plaque at the left ICA origin and bulb resulting in less than 50 % stenosis with respect to the distal vessel. Otherwise minimal cervical left ICA mural irregularity without stenosis to the skull base. Vertebral arteries: Soft and calcified plaque in the proximal right subclavian artery  resulting in 65% stenosis proximal to the right vertebral  artery origin. Calcified plaque about the right vertebral origin but no associated proximal right vertebral stenosis. Patent right vertebral artery to the skull base with mild tortuosity, no stenosis. Mild proximal left subclavian artery plaque without stenosis. Calcified plaque near the left vertebral artery origin but no origin stenosis. Tortuous left V1 segment. Patent left vertebral artery to the skull base without stenosis. CTA HEAD Posterior circulation: Bulky calcified plaque in the right V4 segment with moderate to severe stenosis (series 13, image 144). This is proximal to the right PICA origin which remains patent. No other right vertebral stenosis to the vertebrobasilar junction. Contralateral proximal left V4 calcified plaque with only mild stenosis. Normal left PICA origin and vertebrobasilar junction. Patent basilar artery without stenosis. Normal SCA and left PCA origins. Fetal type right PCA origin. Bilateral PCA branches are within normal limits. Anterior circulation:  Both ICA siphons are patent. On the left there is moderate calcified plaque through the cavernous segment and bulky supraclinoid calcified plaque with severe stenosis just distal to the left ophthalmic artery origin (series 13, image 105). The left carotid terminus remains patent. On the right there is mild to moderate calcified plaque without stenosis. Normal right ophthalmic and posterior communicating artery origins. Patent carotid termini. Normal MCA and ACA origins. Dominant right A1 segment. Anterior communicating artery and bilateral ACA branches are within normal limits. Left MCA M1 segment, bifurcation, and left MCA branches are within normal limits. Right MCA M1 segment, bifurcation, and right MCA branches are within normal limits. Venous sinuses: Patent on the delayed images. Anatomic variants: Fetal type right PCA origin. Dominant right ACA A1 segment. Delayed  phase: No abnormal enhancement identified. Review of the MIP images confirms the above findings IMPRESSION: 1. Negative for large vessel occlusion or arterial dissection in the head or neck. 2. Positive for Severe stenoses of the distal Left ICA Siphon and the distal Right Vertebral Artery due to bulky calcified plaque. 3. Bulky plaque also in the proximal Right Subclavian Artery with 65% stenosis. Cervical carotid atherosclerosis without stenosis. 4. Stable CT appearance of the brain since 2017 with no acute intracranial abnormality. 5. CTA chest today reported separately. Electronically Signed   By: Genevie Ann M.D.   On: 11/26/2018 18:04   Dg Chest 2 View  Result Date: 11/26/2018 CLINICAL DATA:  Pt here from independent living. Pt has been having chest pain with left arm numbness since 01/21/2023. No chest pain currently but her left arm has felt "dead" since 01/21/23. She feels decreased sensation to the left hand. Endorses shortness of breath with exertion. Mild headache to left side. EXAM: CHEST - 2 VIEW COMPARISON:  01/05/2017 FINDINGS: Stable changes from prior CABG surgery. Cardiac silhouette top-normal in size. No mediastinal or hilar masses. No evidence of adenopathy. Lungs are hyperexpanded. There are prominent bronchovascular markings. Linear scarring noted at the left lung base. No evidence of pneumonia or pulmonary edema. No pleural effusion or pneumothorax. Chronic lower thoracic spine mild compression fracture. Skeletal structures are diffusely demineralized. IMPRESSION: 1. No acute cardiopulmonary disease. 2. Stable changes from prior CABG surgery. 3. Lung hyperexpansion consistent with COPD. Electronically Signed   By: [name] M.D.   On: 11/26/2018 16:26   Ct Angio Neck W And/or Wo Contrast  Result Date: 11/26/2018 CLINICAL DATA:  85 year old female with headache, dizziness, vertical diplopia, chest pain, back pain, hypertensive. EXAM: CT ANGIOGRAPHY HEAD AND NECK TECHNIQUE: Multidetector CT  imaging of the head and neck was performed using the standard protocol during bolus administration  of intravenous contrast. Multiplanar CT image reconstructions and MIPs were obtained to evaluate the vascular anatomy. Carotid stenosis measurements (when applicable) are obtained utilizing NASCET criteria, using the distal internal carotid diameter as the denominator. CONTRAST:  2m ISOVUE-370 IOPAMIDOL (ISOVUE-370) INJECTION 76% COMPARISON:  Chest CTA today reported separately. Head face and cervical spine CTs 06/16/2016 and 03/02/2016. Brain MRI 01/16/2016. FINDINGS: CT HEAD Brain: Stable cerebral volume with no intracranial mass effect. No acute intracranial hemorrhage identified. No ventriculomegaly. Small chronic lacune in the left thalamus is stable. Elsewhere stable and normal for age gray-white matter differentiation. No cortically based acute infarct identified. Calvarium and skull base: Osteopenia. No acute osseous abnormality identified. Paranasal sinuses: Visualized paranasal sinuses and mastoids are stable and well pneumatized. Tympanic cavities remain clear. Orbits: No acute orbit or scalp soft tissue finding. CTA NECK Skeleton: Prior sternotomy. Osteopenia. No acute osseous abnormality in the head or neck. Upper chest: Chest CTA reported separately today. Other neck: Negative; no neck mass or lymphadenopathy. Aortic arch: Calcified aortic atherosclerosis. Three vessel arch configuration. Right carotid system: Mildly tortuous brachiocephalic artery and proximal right CCA without stenosis. Minimal right CCA plaque without stenosis proximal to the bifurcation. Calcified plaque at the posterior right ICA origin and bulb without stenosis. Negative cervical right ICA distal to the bulb. Left carotid system: Normal left CCA origin. Mildly tortuous proximal left CCA. No plaque proximal to the bifurcation. Soft and calcified plaque at the left ICA origin and bulb resulting in less than 50 % stenosis with  respect to the distal vessel. Otherwise minimal cervical left ICA mural irregularity without stenosis to the skull base. Vertebral arteries: Soft and calcified plaque in the proximal right subclavian artery resulting in 65% stenosis proximal to the right vertebral artery origin. Calcified plaque about the right vertebral origin but no associated proximal right vertebral stenosis. Patent right vertebral artery to the skull base with mild tortuosity, no stenosis. Mild proximal left subclavian artery plaque without stenosis. Calcified plaque near the left vertebral artery origin but no origin stenosis. Tortuous left V1 segment. Patent left vertebral artery to the skull base without stenosis. CTA HEAD Posterior circulation: Bulky calcified plaque in the right V4 segment with moderate to severe stenosis (series 13, image 144). This is proximal to the right PICA origin which remains patent. No other right vertebral stenosis to the vertebrobasilar junction. Contralateral proximal left V4 calcified plaque with only mild stenosis. Normal left PICA origin and vertebrobasilar junction. Patent basilar artery without stenosis. Normal SCA and left PCA origins. Fetal type right PCA origin. Bilateral PCA branches are within normal limits. Anterior circulation:  Both ICA siphons are patent. On the left there is moderate calcified plaque through the cavernous segment and bulky supraclinoid calcified plaque with severe stenosis just distal to the left ophthalmic artery origin (series 13, image 105). The left carotid terminus remains patent. On the right there is mild to moderate calcified plaque without stenosis. Normal right ophthalmic and posterior communicating artery origins. Patent carotid termini. Normal MCA and ACA origins. Dominant right A1 segment. Anterior communicating artery and bilateral ACA branches are within normal limits. Left MCA M1 segment, bifurcation, and left MCA branches are within normal limits. Right MCA M1  segment, bifurcation, and right MCA branches are within normal limits. Venous sinuses: Patent on the delayed images. Anatomic variants: Fetal type right PCA origin. Dominant right ACA A1 segment. Delayed phase: No abnormal enhancement identified. Review of the MIP images confirms the above findings IMPRESSION: 1. Negative for large vessel occlusion  or arterial dissection in the head or neck. 2. Positive for Severe stenoses of the distal Left ICA Siphon and the distal Right Vertebral Artery due to bulky calcified plaque. 3. Bulky plaque also in the proximal Right Subclavian Artery with 65% stenosis. Cervical carotid atherosclerosis without stenosis. 4. Stable CT appearance of the brain since 2017 with no acute intracranial abnormality. 5. CTA chest today reported separately. Electronically Signed   By: Genevie Ann M.D.   On: 11/26/2018 18:04   Ct Angio Chest Aorta W And/or Wo Contrast  Result Date: 11/26/2018 CLINICAL DATA:  85 year old female with acute chest pain EXAM: CT ANGIOGRAPHY CHEST WITH CONTRAST TECHNIQUE: Multidetector CT imaging of the chest was performed using the standard protocol during bolus administration of intravenous contrast. Multiplanar CT image reconstructions and MIPs were obtained to evaluate the vascular anatomy. CONTRAST:  97m ISOVUE-370 IOPAMIDOL (ISOVUE-370) INJECTION 76% COMPARISON:  11/26/2018 chest radiograph and prior studies FINDINGS: Cardiovascular: Cardiomegaly, CABG changes and LEFT atrial appendage clip again noted. Coronary artery and aortic atherosclerotic calcifications are identified without evidence of thoracic aortic aneurysm or dissection. No pulmonary emboli or pericardial effusion identified. Mediastinum/Nodes: No enlarged mediastinal, hilar, or axillary lymph nodes. Thyroid gland, trachea, and esophagus demonstrate no significant findings. Lungs/Pleura: Mild bilateral ground-glass opacities in a mosaic type pattern probably represents small airway disease or possibly  small vessel disease. No airspace disease, consolidation, mass, nodule, pleural effusion or pneumothorax noted. Mild bibasilar atelectasis/scarring noted. Upper Abdomen: No acute abnormality. Musculoskeletal: No acute or suspicious bony abnormalities. T10 compression fracture is unchanged. Review of the MIP images confirms the above findings. IMPRESSION: 1. No evidence of thoracic aortic aneurysm/dissection or pulmonary emboli. 2. Cardiomegaly and CABG changes. 3. Mild bilateral ground-glass opacities in a mosaic type pattern probably representing small airway disease. Mild bibasilar atelectasis/scarring 4.  Aortic Atherosclerosis (ICD10-I70.0). Electronically Signed   By: JMargarette CanadaM.D.   On: 11/26/2018 18:00    Procedures Procedures (including critical care time)  Medications Ordered in ED Medications  levothyroxine (SYNTHROID, LEVOTHROID) tablet 50 mcg (has no administration in time range)  furosemide (LASIX) tablet 20 mg (has no administration in time range)  Rivaroxaban (XARELTO) tablet 15 mg (has no administration in time range)  amiodarone (PACERONE) tablet 100 mg (has no administration in time range)  Melatonin TABS 3 mg (3 mg Oral Given 11/26/18 2351)  magnesium oxide (MAG-OX) tablet 400 mg (has no administration in time range)  traZODone (DESYREL) tablet 25 mg (has no administration in time range)  pantoprazole (PROTONIX) EC tablet 40 mg (has no administration in time range)  psyllium (HYDROCIL/METAMUCIL) packet 1 packet (has no administration in time range)  acetaminophen (TYLENOL) tablet 650 mg (has no administration in time range)    Or  acetaminophen (TYLENOL) solution 650 mg (has no administration in time range)    Or  acetaminophen (TYLENOL) suppository 650 mg (has no administration in time range)  amLODipine (NORVASC) tablet 2.5 mg (has no administration in time range)  hydrALAZINE (APRESOLINE) injection 10 mg (has no administration in time range)  white petrolatum (VASELINE)  gel (has no administration in time range)  sodium chloride flush (NS) 0.9 % injection 3 mL (3 mLs Intravenous Given 11/26/18 1519)  iopamidol (ISOVUE-370) 76 % injection (80 mLs  Contrast Given 11/26/18 1715)  labetalol (NORMODYNE,TRANDATE) injection 5 mg (5 mg Intravenous Given 11/26/18 2352)   stroke: mapping our early stages of recovery book ( Does not apply Given 11/26/18 2351)     Initial Impression / Assessment and Plan /  ED Course  I have reviewed the triage vital signs and the nursing notes.  Pertinent labs & imaging results that were available during my care of the patient were reviewed by me and considered in my medical decision making (see chart for details).     RAMIA SIDNEY is a 85 y.o. female with a past medical history significant for CAD status post CABG, prior TIA, hypertension, prior DVT and atrial fibrillation on Xarelto, congestive heart failure, CKD, hypothyroidism, carotid stenosis, aortic insufficiency, and chronic anemia who presents with elevated blood pressure, back pain, chest pain, shortness of breath, transient vertical diplopia, transient left arm numbness and weakness, exertional shortness of breath, and fatigue.  Patient is accompanied by daughter.  Patient reports that for the last 3 and half days she has been having symptoms on and off.  She reports that she woke up late Thursday night and had several hours of symptoms.  She reports her symptoms began with left scapula and back pain that radiated into her left elbow.  She reports that she also had blood pressure elevated at 211/90.  She said that she had some left arm numbness and weakness in this context and also had some mild headache.  She reports that she also had some associated nausea and diaphoresis.  She is unsure if this feels like when she had prior MI requiring bypass.  She does say that for the last month she has been having pressure in her central chest that she thinks is due to her bra pushing on her  sternum.  She is never had this type of pain before.  She says that over the last couple days she has had transient episodes of nausea, exertional shortness of breath, pain in the chest and her back, headache, and had one episode of vertical diplopia lasting for several minutes.  She is never had this before.  She reports her blood pressure has intermittently been over 341 systolic over the last several days.  On arrival, patient blood pressure has improved and on my exam was in the 937 systolic.  Patient has symmetric radial pulses.  Symmetric pedal pulses.  Patient had no focal neurologic deficits on my exam with no facial droop.  Pupils were reactive and symmetric.  Normal extraocular movements.  Clear speech.  Patient had normal strength and sensation in all extremities.  Patient had normal finger-nose-finger testing bilaterally.  Patient had no leg tenderness or swelling seen on my leg exam.  Abdomen and chest were nontender.  Patient has a murmur.  EKG shows no STEMI.  Clinically I am concerned about several things.  I am concerned about hypertensive emergency causing symptoms transiently.  Given the patient's pain going into her back and her chest as well as some neurologic deficits transiliac concerned about aortic dissection or vertebral artery dissection.  Patient has a history of carotid disease per the chart.  Patient will have screen laboratory testing and imaging of her chest, head, neck.  Patient will likely need evaluation by neurology and cardiology during this visit.  I am concerned about TIA.  Anticipate reassessment after work-up.  8:41 PM CT imaging showed no dissection in the aorta or the carotid arteries.  Patient does have some carotid stenosis.  Patient continued to have fluctuating blood pressures most recently up to 902 systolic.  Neurology saw the patient and felt she was likely having hypertensive emergency versus TIA given the left arm numbness and reported  weakness.  Neurology recommended admission for further  blood pressure evaluation, further chest pain evaluation, and for MRI of the brain.  Hospitalist team will be called for admission and MRI will be ordered.  Patient given labetalol for elevated blood pressure.  Patient was admitted for further management.  Final Clinical Impressions(s) / ED Diagnoses   Final diagnoses:  Hypertensive emergency    ED Discharge Orders    None      Clinical Impression: 1. Hypertensive emergency     Disposition: Admit  This note was prepared with assistance of Dragon voice recognition software. Occasional wrong-word or sound-a-like substitutions may have occurred due to the inherent limitations of voice recognition software.     [facility] Cortez, Gwenyth Allegra, MD 11/27/18 [phone]

## 2018-11-26 NOTE — ED Notes (Signed)
Pt ambulated to the bathroom w/ one staff for stability.  No issues ambulating.

## 2018-11-26 NOTE — ED Notes (Signed)
Lab states all labs are able to be added on to previous blood work

## 2018-11-26 NOTE — ED Notes (Signed)
Patient transported to CT 

## 2018-11-26 NOTE — Consult Note (Signed)
Requesting Physician: Dr. Sherry Ruffing    Chief Complaint: Left-sided weakness, headache, generalized fatigue  History obtained from: Patient and Chart    HPI:                                                                                                                                       Maria [name] is an 85 y.o. female with past medical history of hypertension, atrial fibrillation on Xarelto, right carotid stenosis, coronary artery disease, CKD, chronic diastolic heart failure presents to the ER with 3-day history of headache as well as multiple complaints the setting of elevated blood pressure. She states that she had some double vision that lasted for a few seconds while reading a book as well as noticed that her left arm feels heavier for the last 3 days.  She denies any numbness or tingling of her left upper lower extremity, although stated that 1 morning she woke up with pain radiating from her neck down to her arm. She is also complaining of chest pressure.  Her blood pressure was in the 017P systolic when she checked at home and has remained elevated while in the ER.   Date last known well:  3 days ago tPA Given: no, outside window    Past Medical History:  Diagnosis Date  . Anemia   . Atonic bladder 12/11/2013  . Atrial fibrillation ([place])   . Carotid artery occlusion    right carotid stenosis 40-60%, 4/08, neg for stenosis repeat study 11/11  . Celiac disease   . Chronic anticoagulation CHADS2VASC2 score 7 10/18/2014  . Chronic diastolic CHF (congestive heart failure) (HCC)    takes Lasix daily  . CKD (chronic kidney disease), stage III ([place])   . Coronary artery disease    a.  08/2013 (LIMA-LAD, rSVG to LCX and Lt atrial clip),  . Depression   . DVT (deep venous thrombosis) ([place]) 09/2013  . Gastric ulcer    6-29month ago  . GERD (gastroesophageal reflux disease)    takes Omeprazole daily  . GI bleeding   . Hemorrhoids   . Hot flashes    takes ZOloft 3 times a  week  . Hypertension   . Hypothyroidism    takes Synthroid daily as result of AMiodarone  . Insomnia   . Macular degeneration   . Mild aortic insufficiency    a. Echo 10/2013: mild AI.  .Marland KitchenModerate mitral regurgitation by prior echocardiogram    a. Echo 10/2013.  . Moderate obstructive sleep apnea    uses CPAP;sleep study about 4-586monthago  . MVP (mitral valve prolapse)   . Osteoporosis   . PAF (paroxysmal atrial fibrillation) ([facility]  . Peripheral edema    left  . Presbycusis   . Pulmonary hypertension ([facility]  . RLS (restless legs syndrome)   . Thyroid nodule    58m12mno change 11/11  . Urethral stricture   .  Urinary anastomotic stricture   . Urinary retention   . Vitamin D deficiency     Past Surgical History:  Procedure Laterality Date  . APPENDECTOMY    . bilateral cataract surgery    . CARDIAC CATHETERIZATION  08-06-13  . CORONARY ARTERY BYPASS GRAFT N/A 08/28/2013   Procedure: CORONARY ARTERY BYPASS GRAFTING (CABG) x2 using right greater saphenous vein and left internal mammary artery. ;  Surgeon: Grace Isaac, MD;  Location: [facility];  Service: Open Heart Surgery;  Laterality: N/A;  . HIP ARTHROPLASTY Right 12/09/2013   Procedure: ARTHROPLASTY BIPOLAR HIP CEMENTED;  Surgeon: Kerin Salen, MD;  Location: [facility];  Service: Orthopedics;  Laterality: Right;  . INTRAOPERATIVE TRANSESOPHAGEAL ECHOCARDIOGRAM N/A 08/28/2013   Procedure: INTRAOPERATIVE TRANSESOPHAGEAL ECHOCARDIOGRAM;  Surgeon: Grace Isaac, MD;  Location: [facility];  Service: Open Heart Surgery;  Laterality: N/A;  . LEFT AND RIGHT HEART CATHETERIZATION WITH CORONARY ANGIOGRAM N/A 08/16/2013   Procedure: LEFT AND RIGHT HEART CATHETERIZATION WITH CORONARY ANGIOGRAM;  Surgeon: [name], MD;  Location: [facility] C[facility] LAB;  Service: Cardiovascular;  Laterality: N/A;  . MANDIBLE SURGERY    . TCS    . TONSILLECTOMY    . trigger thumb    . TUBAL LIGATION      Family History  Problem Relation Age of Onset  .  Heart attack Mother   . CVA Mother   . CAD Father   . Colon cancer Father   . Heart attack Father   . Heart attack Brother   . Peripheral vascular disease Brother   . AAA (abdominal aortic aneurysm) Brother    Social History:  reports that she has never smoked. She has never used smokeless tobacco. She reports that she does not drink alcohol or use drugs.  Allergies:  Allergies  Allergen Reactions  . Alendronate Sodium Other (See Comments)    Jaw pain  . Amoxicillin Other (See Comments)    Nausea, dizziness Nausea, dizziness  . Codeine Nausea And Vomiting  . Fosamax [Alendronate Sodium] Other (See Comments)    Jaw pain  . Gluten Meal Other (See Comments)    Celiac Disease Celiac Disease  . Hydrochlorothiazide Other (See Comments)    Hyponatremia, GI upset Hyponatremia, GI upset  . Tetanus Toxoid, Adsorbed Other (See Comments)    Bad local reaction  . Tetanus Toxoids Other (See Comments)    Bad local reaction    Medications:                                                                                                                        I reviewed home medications   ROS:  14 systems reviewed and negative except above   Examination:                                                                                                      General: Appears well-developed, thin Psych: Affect appropriate to situation Eyes: No scleral injection HENT: No OP obstrucion Head: Normocephalic.  Cardiovascular: Normal rate and regular rhythm.  Respiratory: Effort normal and breath sounds normal to anterior ascultation GI: Soft.  No distension. There is no tenderness.  Skin: WDI    Neurological Examination Mental Status: Alert, oriented, thought content appropriate.  Speech fluent without evidence of aphasia. Able to follow 3 step commands without  difficulty. Cranial Nerves: II: Visual fields grossly normal,  III,IV, VI: ptosis not present, extra-ocular motions intact bilaterally, pupils equal, round, reactive to light and accommodation V,VII: smile symmetric, facial light touch sensation normal bilaterally VIII: hearing normal bilaterally IX,X: uvula rises symmetrically XI: bilateral shoulder shrug XII: midline tongue extension Motor: Right : Upper extremity   5/5    Left:     Upper extremity   5/5  Lower extremity   5/5     Lower extremity   5/5 Tone and bulk:normal tone throughout; no atrophy noted Sensory: Pinprick and light touch intact throughout, bilaterally Plantars: Right: downgoing   Left: downgoing Cerebellar: normal finger-to-nose, normal rapid alternating movements and normal heel-to-shin test Gait: normal gait and station     Lab Results: Basic Metabolic Panel: Recent Labs  Lab 11/26/18 1521  NA 136  K 4.2  CL 104  CO2 22  GLUCOSE 91  BUN 10  CREATININE 0.86  CALCIUM 9.1  MG 2.2    CBC: Recent Labs  Lab 11/26/18 1521  WBC 5.8  HGB 12.5  HCT 38.6  MCV 90.4  PLT 262    Coagulation Studies: Recent Labs    11/26/18 1521  LABPROT 17.2*  INR 1.42    Imaging: Ct Angio Head W Or Wo Contrast  Result Date: 11/26/2018 CLINICAL DATA:  85 year old female with headache, dizziness, vertical diplopia, chest pain, back pain, hypertensive. EXAM: CT ANGIOGRAPHY HEAD AND NECK TECHNIQUE: Multidetector CT imaging of the head and neck was performed using the standard protocol during bolus administration of intravenous contrast. Multiplanar CT image reconstructions and MIPs were obtained to evaluate the vascular anatomy. Carotid stenosis measurements (when applicable) are obtained utilizing NASCET criteria, using the distal internal carotid diameter as the denominator. CONTRAST:  75m ISOVUE-370 IOPAMIDOL (ISOVUE-370) INJECTION 76% COMPARISON:  Chest CTA today reported separately. Head face and cervical spine  CTs 06/16/2016 and 03/02/2016. Brain MRI 01/16/2016. FINDINGS: CT HEAD Brain: Stable cerebral volume with no intracranial mass effect. No acute intracranial hemorrhage identified. No ventriculomegaly. Small chronic lacune in the left thalamus is stable. Elsewhere stable and normal for age gray-white matter differentiation. No cortically based acute infarct identified. Calvarium and skull base: Osteopenia. No acute osseous abnormality identified. Paranasal sinuses: Visualized paranasal sinuses and mastoids are stable and well pneumatized. Tympanic cavities remain clear. Orbits: No acute orbit or scalp soft tissue finding. CTA NECK Skeleton: Prior sternotomy. Osteopenia. No  acute osseous abnormality in the head or neck. Upper chest: Chest CTA reported separately today. Other neck: Negative; no neck mass or lymphadenopathy. Aortic arch: Calcified aortic atherosclerosis. Three vessel arch configuration. Right carotid system: Mildly tortuous brachiocephalic artery and proximal right CCA without stenosis. Minimal right CCA plaque without stenosis proximal to the bifurcation. Calcified plaque at the posterior right ICA origin and bulb without stenosis. Negative cervical right ICA distal to the bulb. Left carotid system: Normal left CCA origin. Mildly tortuous proximal left CCA. No plaque proximal to the bifurcation. Soft and calcified plaque at the left ICA origin and bulb resulting in less than 50 % stenosis with respect to the distal vessel. Otherwise minimal cervical left ICA mural irregularity without stenosis to the skull base. Vertebral arteries: Soft and calcified plaque in the proximal right subclavian artery resulting in 65% stenosis proximal to the right vertebral artery origin. Calcified plaque about the right vertebral origin but no associated proximal right vertebral stenosis. Patent right vertebral artery to the skull base with mild tortuosity, no stenosis. Mild proximal left subclavian artery plaque without  stenosis. Calcified plaque near the left vertebral artery origin but no origin stenosis. Tortuous left V1 segment. Patent left vertebral artery to the skull base without stenosis. CTA HEAD Posterior circulation: Bulky calcified plaque in the right V4 segment with moderate to severe stenosis (series 13, image 144). This is proximal to the right PICA origin which remains patent. No other right vertebral stenosis to the vertebrobasilar junction. Contralateral proximal left V4 calcified plaque with only mild stenosis. Normal left PICA origin and vertebrobasilar junction. Patent basilar artery without stenosis. Normal SCA and left PCA origins. Fetal type right PCA origin. Bilateral PCA branches are within normal limits. Anterior circulation:  Both ICA siphons are patent. On the left there is moderate calcified plaque through the cavernous segment and bulky supraclinoid calcified plaque with severe stenosis just distal to the left ophthalmic artery origin (series 13, image 105). The left carotid terminus remains patent. On the right there is mild to moderate calcified plaque without stenosis. Normal right ophthalmic and posterior communicating artery origins. Patent carotid termini. Normal MCA and ACA origins. Dominant right A1 segment. Anterior communicating artery and bilateral ACA branches are within normal limits. Left MCA M1 segment, bifurcation, and left MCA branches are within normal limits. Right MCA M1 segment, bifurcation, and right MCA branches are within normal limits. Venous sinuses: Patent on the delayed images. Anatomic variants: Fetal type right PCA origin. Dominant right ACA A1 segment. Delayed phase: No abnormal enhancement identified. Review of the MIP images confirms the above findings IMPRESSION: 1. Negative for large vessel occlusion or arterial dissection in the head or neck. 2. Positive for Severe stenoses of the distal Left ICA Siphon and the distal Right Vertebral Artery due to bulky calcified  plaque. 3. Bulky plaque also in the proximal Right Subclavian Artery with 65% stenosis. Cervical carotid atherosclerosis without stenosis. 4. Stable CT appearance of the brain since 2017 with no acute intracranial abnormality. 5. CTA chest today reported separately. Electronically Signed   By: Genevie Ann M.D.   On: 11/26/2018 18:04   Dg Chest 2 View  Result Date: 11/26/2018 CLINICAL DATA:  Pt here from independent living. Pt has been having chest pain with left arm numbness since 01/01/2023. No chest pain currently but her left arm has felt "dead" since Jan 01, 2023. She feels decreased sensation to the left hand. Endorses shortness of breath with exertion. Mild headache to left side. EXAM: CHEST -  2 VIEW COMPARISON:  01/05/2017 FINDINGS: Stable changes from prior CABG surgery. Cardiac silhouette top-normal in size. No mediastinal or hilar masses. No evidence of adenopathy. Lungs are hyperexpanded. There are prominent bronchovascular markings. Linear scarring noted at the left lung base. No evidence of pneumonia or pulmonary edema. No pleural effusion or pneumothorax. Chronic lower thoracic spine mild compression fracture. Skeletal structures are diffusely demineralized. IMPRESSION: 1. No acute cardiopulmonary disease. 2. Stable changes from prior CABG surgery. 3. Lung hyperexpansion consistent with COPD. Electronically Signed   By: [name] M.D.   On: 11/26/2018 16:26   Ct Angio Neck W And/or Wo Contrast  Result Date: 11/26/2018 CLINICAL DATA:  85 year old female with headache, dizziness, vertical diplopia, chest pain, back pain, hypertensive. EXAM: CT ANGIOGRAPHY HEAD AND NECK TECHNIQUE: Multidetector CT imaging of the head and neck was performed using the standard protocol during bolus administration of intravenous contrast. Multiplanar CT image reconstructions and MIPs were obtained to evaluate the vascular anatomy. Carotid stenosis measurements (when applicable) are obtained utilizing NASCET criteria, using  the distal internal carotid diameter as the denominator. CONTRAST:  52m ISOVUE-370 IOPAMIDOL (ISOVUE-370) INJECTION 76% COMPARISON:  Chest CTA today reported separately. Head face and cervical spine CTs 06/16/2016 and 03/02/2016. Brain MRI 01/16/2016. FINDINGS: CT HEAD Brain: Stable cerebral volume with no intracranial mass effect. No acute intracranial hemorrhage identified. No ventriculomegaly. Small chronic lacune in the left thalamus is stable. Elsewhere stable and normal for age gray-white matter differentiation. No cortically based acute infarct identified. Calvarium and skull base: Osteopenia. No acute osseous abnormality identified. Paranasal sinuses: Visualized paranasal sinuses and mastoids are stable and well pneumatized. Tympanic cavities remain clear. Orbits: No acute orbit or scalp soft tissue finding. CTA NECK Skeleton: Prior sternotomy. Osteopenia. No acute osseous abnormality in the head or neck. Upper chest: Chest CTA reported separately today. Other neck: Negative; no neck mass or lymphadenopathy. Aortic arch: Calcified aortic atherosclerosis. Three vessel arch configuration. Right carotid system: Mildly tortuous brachiocephalic artery and proximal right CCA without stenosis. Minimal right CCA plaque without stenosis proximal to the bifurcation. Calcified plaque at the posterior right ICA origin and bulb without stenosis. Negative cervical right ICA distal to the bulb. Left carotid system: Normal left CCA origin. Mildly tortuous proximal left CCA. No plaque proximal to the bifurcation. Soft and calcified plaque at the left ICA origin and bulb resulting in less than 50 % stenosis with respect to the distal vessel. Otherwise minimal cervical left ICA mural irregularity without stenosis to the skull base. Vertebral arteries: Soft and calcified plaque in the proximal right subclavian artery resulting in 65% stenosis proximal to the right vertebral artery origin. Calcified plaque about the right  vertebral origin but no associated proximal right vertebral stenosis. Patent right vertebral artery to the skull base with mild tortuosity, no stenosis. Mild proximal left subclavian artery plaque without stenosis. Calcified plaque near the left vertebral artery origin but no origin stenosis. Tortuous left V1 segment. Patent left vertebral artery to the skull base without stenosis. CTA HEAD Posterior circulation: Bulky calcified plaque in the right V4 segment with moderate to severe stenosis (series 13, image 144). This is proximal to the right PICA origin which remains patent. No other right vertebral stenosis to the vertebrobasilar junction. Contralateral proximal left V4 calcified plaque with only mild stenosis. Normal left PICA origin and vertebrobasilar junction. Patent basilar artery without stenosis. Normal SCA and left PCA origins. Fetal type right PCA origin. Bilateral PCA branches are within normal limits. Anterior circulation:  Both ICA  siphons are patent. On the left there is moderate calcified plaque through the cavernous segment and bulky supraclinoid calcified plaque with severe stenosis just distal to the left ophthalmic artery origin (series 13, image 105). The left carotid terminus remains patent. On the right there is mild to moderate calcified plaque without stenosis. Normal right ophthalmic and posterior communicating artery origins. Patent carotid termini. Normal MCA and ACA origins. Dominant right A1 segment. Anterior communicating artery and bilateral ACA branches are within normal limits. Left MCA M1 segment, bifurcation, and left MCA branches are within normal limits. Right MCA M1 segment, bifurcation, and right MCA branches are within normal limits. Venous sinuses: Patent on the delayed images. Anatomic variants: Fetal type right PCA origin. Dominant right ACA A1 segment. Delayed phase: No abnormal enhancement identified. Review of the MIP images confirms the above findings IMPRESSION: 1.  Negative for large vessel occlusion or arterial dissection in the head or neck. 2. Positive for Severe stenoses of the distal Left ICA Siphon and the distal Right Vertebral Artery due to bulky calcified plaque. 3. Bulky plaque also in the proximal Right Subclavian Artery with 65% stenosis. Cervical carotid atherosclerosis without stenosis. 4. Stable CT appearance of the brain since 2017 with no acute intracranial abnormality. 5. CTA chest today reported separately. Electronically Signed   By: Genevie Ann M.D.   On: 11/26/2018 18:04   Ct Angio Chest Aorta W And/or Wo Contrast  Result Date: 11/26/2018 CLINICAL DATA:  85 year old female with acute chest pain EXAM: CT ANGIOGRAPHY CHEST WITH CONTRAST TECHNIQUE: Multidetector CT imaging of the chest was performed using the standard protocol during bolus administration of intravenous contrast. Multiplanar CT image reconstructions and MIPs were obtained to evaluate the vascular anatomy. CONTRAST:  105m ISOVUE-370 IOPAMIDOL (ISOVUE-370) INJECTION 76% COMPARISON:  11/26/2018 chest radiograph and prior studies FINDINGS: Cardiovascular: Cardiomegaly, CABG changes and LEFT atrial appendage clip again noted. Coronary artery and aortic atherosclerotic calcifications are identified without evidence of thoracic aortic aneurysm or dissection. No pulmonary emboli or pericardial effusion identified. Mediastinum/Nodes: No enlarged mediastinal, hilar, or axillary lymph nodes. Thyroid gland, trachea, and esophagus demonstrate no significant findings. Lungs/Pleura: Mild bilateral ground-glass opacities in a mosaic type pattern probably represents small airway disease or possibly small vessel disease. No airspace disease, consolidation, mass, nodule, pleural effusion or pneumothorax noted. Mild bibasilar atelectasis/scarring noted. Upper Abdomen: No acute abnormality. Musculoskeletal: No acute or suspicious bony abnormalities. T10 compression fracture is unchanged. Review of the MIP images  confirms the above findings. IMPRESSION: 1. No evidence of thoracic aortic aneurysm/dissection or pulmonary emboli. 2. Cardiomegaly and CABG changes. 3. Mild bilateral ground-glass opacities in a mosaic type pattern probably representing small airway disease. Mild bibasilar atelectasis/scarring 4.  Aortic Atherosclerosis (ICD10-I70.0). Electronically Signed   By: JMargarette CanadaM.D.   On: 11/26/2018 18:00     ASSESSMENT AND PLAN  85y.o. female with past medical history of hypertension, atrial fibrillation on Xarelto, right carotid stenosis, coronary artery disease, CKD, chronic diastolic heart failure with 3-day history of headache, left-sided weakness, episode of double vision and chest pressure in the setting of accelerated hypertension.  Given her multiple vascular risk factors, it would be reasonable to obtain MRI brain to rule out a small acute infarct.  However I suspect her present symptoms are likely due to elevated high blood pressure.   Hypertensive emergency, possible small ischemic stroke  Recommendations MRI Brain to r/o stroke Continue Xarelto for atrial fibrillation  Blood  pressure goal 1 [id number] systolic (patient outside permissive  window), recommend gradual reduction in blood pressure    Sushanth Aroor [facility] Pager Number [phone]

## 2018-11-27 ENCOUNTER — Encounter (HOSPITAL_COMMUNITY): Payer: Self-pay

## 2018-11-27 ENCOUNTER — Observation Stay (HOSPITAL_BASED_OUTPATIENT_CLINIC_OR_DEPARTMENT_OTHER): Payer: Medicare Other

## 2018-11-27 ENCOUNTER — Observation Stay (HOSPITAL_COMMUNITY): Payer: Medicare Other

## 2018-11-27 DIAGNOSIS — I351 Nonrheumatic aortic (valve) insufficiency: Secondary | ICD-10-CM | POA: Diagnosis not present

## 2018-11-27 DIAGNOSIS — I272 Pulmonary hypertension, unspecified: Secondary | ICD-10-CM | POA: Diagnosis not present

## 2018-11-27 DIAGNOSIS — I361 Nonrheumatic tricuspid (valve) insufficiency: Secondary | ICD-10-CM

## 2018-11-27 DIAGNOSIS — I10 Essential (primary) hypertension: Secondary | ICD-10-CM | POA: Diagnosis not present

## 2018-11-27 DIAGNOSIS — R51 Headache: Secondary | ICD-10-CM | POA: Diagnosis not present

## 2018-11-27 DIAGNOSIS — I480 Paroxysmal atrial fibrillation: Secondary | ICD-10-CM | POA: Diagnosis not present

## 2018-11-27 DIAGNOSIS — I639 Cerebral infarction, unspecified: Secondary | ICD-10-CM | POA: Diagnosis not present

## 2018-11-27 DIAGNOSIS — R079 Chest pain, unspecified: Secondary | ICD-10-CM

## 2018-11-27 DIAGNOSIS — I63542 Cerebral infarction due to unspecified occlusion or stenosis of left cerebellar artery: Secondary | ICD-10-CM | POA: Diagnosis not present

## 2018-11-27 DIAGNOSIS — N183 Chronic kidney disease, stage 3 (moderate): Secondary | ICD-10-CM

## 2018-11-27 DIAGNOSIS — H532 Diplopia: Secondary | ICD-10-CM | POA: Diagnosis not present

## 2018-11-27 LAB — URINE CULTURE: CULTURE: NO GROWTH

## 2018-11-27 LAB — LIPID PANEL
Cholesterol: 223 mg/dL — ABNORMAL HIGH (ref 0–200)
HDL: 74 mg/dL (ref >40)
LDL Cholesterol: 130 mg/dL — ABNORMAL HIGH (ref 0–99)
TRIGLYCERIDES: 93 mg/dL (ref <150)
Total CHOL/HDL Ratio: 3 RATIO
VLDL: 19 mg/dL (ref 0–40)

## 2018-11-27 LAB — ECHOCARDIOGRAM COMPLETE
Height: 61.5 in
Weight: 1735.46 oz

## 2018-11-27 LAB — HEMOGLOBIN A1C
Hgb A1c MFr Bld: 5.4 % (ref 4.8–5.6)
Mean Plasma Glucose: 108.28 mg/dL

## 2018-11-27 LAB — TROPONIN I
Troponin I: <0.03 ng/mL (ref <0.03)
Troponin I: <0.03 ng/mL (ref <0.03)

## 2018-11-27 MED ORDER — ASPIRIN EC 81 MG PO TBEC
81.0000 mg | DELAYED_RELEASE_TABLET | Freq: Every day | ORAL | Status: DC
  Administered 2018-11-28: 81 mg via ORAL
  Filled 2018-11-27 (×2): qty 1

## 2018-11-27 MED ORDER — ATORVASTATIN CALCIUM 10 MG PO TABS: 20.0000 mg | ORAL_TABLET | Freq: Every day | ORAL | Status: DC

## 2018-11-27 MED ORDER — APIXABAN 2.5 MG PO TABS
2.5000 mg | ORAL_TABLET | Freq: Two times a day (BID) | ORAL | Status: DC
  Administered 2018-11-27 – 2018-11-28 (×2): 2.5 mg via ORAL
  Filled 2018-11-27 (×2): qty 1

## 2018-11-27 MED ORDER — ATORVASTATIN CALCIUM 40 MG PO TABS
40.0000 mg | ORAL_TABLET | Freq: Every day | ORAL | Status: DC
  Administered 2018-11-27: 40 mg via ORAL
  Filled 2018-11-27: qty 1

## 2018-11-27 MED ORDER — WHITE PETROLATUM EX OINT
TOPICAL_OINTMENT | CUTANEOUS | Status: AC
  Administered 2018-11-27: 0.2
  Filled 2018-11-27: qty 28.35

## 2018-11-27 NOTE — Evaluation (Signed)
Physical Therapy Evaluation Patient Details Name: Maria [name] MRN: [id number] DOB: Jun 06, 1924 Today's Date: 11/27/2018   History of Present Illness  85 y.o. female with past medical history of hypertension, atrial fibrillation on Xarelto, right carotid stenosis, coronary artery disease, CKD, chronic diastolic heart failure presents to the ER with 3-day history of headache as well as multiple complaints the setting of elevated blood pressure.  Clinical Impression  Pt is at or close to baseline functioning and should be safe at home alone. There are no further acute PT needs.  Will sign off at this time.     Follow Up Recommendations No PT follow up    Equipment Recommendations  None recommended by PT    Recommendations for Other Services       Precautions / Restrictions Precautions Precautions: Fall Restrictions Weight Bearing Restrictions: No      Mobility  Bed Mobility Overal bed mobility: Modified Independent Bed Mobility: Supine to Sit;Sit to Supine     Supine to sit: Supervision Sit to supine: Supervision   General bed mobility comments: verbal cuing for hand placement and safety  Transfers Overall transfer level: Needs assistance Equipment used: Straight cane Transfers: Sit to/from Stand Sit to Stand: Supervision;Modified independent (Device/Increase time) Stand pivot transfers: Supervision;Min guard       General transfer comment: safe use of hands  Ambulation/Gait Ambulation/Gait assistance: Supervision;Modified independent (Device/Increase time) Gait Distance (Feet): 300 Feet Assistive device: Straight cane Gait Pattern/deviations: Step-through pattern   Gait velocity interpretation: >2.62 ft/sec, indicative of community ambulatory General Gait Details: steady, normalized gait with community level gait speed.  Stairs            Wheelchair Mobility    Modified Rankin (Stroke Patients Only)       Balance Overall balance assessment:  Needs assistance Sitting-balance support: Feet supported Sitting balance-Leahy Scale: Good     Standing balance support: During functional activity Standing balance-Leahy Scale: Fair Standing balance comment: min guard                             Pertinent Vitals/Pain Pain Assessment: Faces Faces Pain Scale: Hurts a little bit Pain Location: right LE with MMT Pain Descriptors / Indicators: Discomfort Pain Intervention(s): Limited activity within patient's tolerance    Home Living Family/patient expects to be discharged to:: Other (Comment)(ILF) Living Arrangements: Alone Available Help at Discharge: Available PRN/intermittently Type of Home: Independent living facility Home Access: Level entry     Home Layout: One level Home Equipment: Cane - single point Additional Comments: Pt verbalized using quad cane in community    Prior Function Level of Independence: Independent               Hand Dominance   Dominant Hand: Right    Extremity/Trunk Assessment   Upper Extremity Assessment Upper Extremity Assessment: Defer to OT evaluation    Lower Extremity Assessment Lower Extremity Assessment: Overall WFL for tasks assessed(mild proximal weakness)       Communication   Communication: HOH  Cognition Arousal/Alertness: Awake/alert Behavior During Therapy: WFL for tasks assessed/performed Overall Cognitive Status: Within Functional Limits for tasks assessed                                        General Comments      Exercises     Assessment/Plan    PT  Assessment Patent does not need any further PT services  PT Problem List         PT Treatment Interventions      PT Goals (Current goals can be found in the Care Plan section)  Acute Rehab PT Goals Patient Stated Goal: to go home PT Goal Formulation: With patient    Frequency     Barriers to discharge        Co-evaluation               AM-PAC PT "6 Clicks"  Mobility  Outcome Measure Help needed turning from your back to your side while in a flat bed without using bedrails?: None Help needed moving from lying on your back to sitting on the side of a flat bed without using bedrails?: None Help needed moving to and from a bed to a chair (including a wheelchair)?: None Help needed standing up from a chair using your arms (e.g., wheelchair or bedside chair)?: None Help needed to walk in hospital room?: None Help needed climbing 3-5 steps with a railing? : A Little 6 Click Score: 23    End of Session   Activity Tolerance: Patient tolerated treatment well Patient left: in bed;with call bell/phone within reach;with family/visitor present Nurse Communication: Mobility status PT Visit Diagnosis: Unsteadiness on feet (R26.81)    Time: 1914-7829 PT Time Calculation (min) (ACUTE ONLY): 31 min   Charges:   PT Evaluation $PT Eval Low Complexity: 1 Low PT Treatments $Gait Training: 8-22 mins        11/27/2018  Nichols Bing, PT Acute Rehabilitation Services [phone]  (pager) [phone]  (office)  Maria [name] 11/27/2018, 1:17 PM

## 2018-11-27 NOTE — Care Management Note (Signed)
Case Management Note  Patient Details  Name: Maria [name] MRN: [id number] Date of Birth: 02/22/1924  Subjective/Objective:  Pt in to r/o CVA. She is from the ILF: [facility]. Daughter at the bedside. DME: cane, walker, rollator Pts meds delivered to her home. No issues with transportation.                  Action/Plan: No f/u per PT. OT recommending HH. Pt asking to have her therapy through [facility]. CM called over and they can do outpatient therapy. MD in agreement. Orders faxed: [phone]. Pt has transportation back to [facility] when medically ready for d/c.   Expected Discharge Date:                  Expected Discharge Plan:  OP Rehab  In-House Referral:     Discharge planning Services  CM Consult  Post Acute Care Choice:    Choice offered to:     DME Arranged:    DME Agency:     HH Arranged:    [facility] Agency:     Status of Service:  Completed, signed off  If discussed at [employer] of Stay Meetings, dates discussed:    Additional Comments:  Pollie Friar, RN 11/27/2018, 3:33 PM

## 2018-11-27 NOTE — Progress Notes (Signed)
PROGRESS NOTE    Maria [name]  BJY:[id number] DOB: 1923-11-28 DOA: 11/26/2018 PCP: Merlene Laughter, MD   Brief Narrative:  85 year old with history of essential hypertension, atrial fibrillation on Xarelto, right carotid artery stenosis, coronary artery disease, chronic diastolic congestive heart failure, CKD stage II came to the hospital with complains of left-sided weakness, diplopia.  She was admitted to the hospital for routine CVA work-up.   Assessment & Plan:   Principal Problem:   CVA (cerebral vascular accident) (HCC) Active Problems:   Essential hypertension, benign   Paroxysmal atrial fibrillation (HCC)   Pulmonary hypertension, moderate to severe (HCC)   CKD (chronic kidney disease), stage III (HCC)   Chest pain  Left-sided weakness and diplopia, improved -MRI of the brain is negative for acute infarction - CTA shows left ICA stenosis, she would benefit from outpatient evaluation by vascular surgery but patient is hesitant to proceed with any sort of surgical intervention at this time - A1c is 5.4, LDL 130. -Echocardiogram -results pending -Increase Lipitor to 40 mg daily - Continue Xarelto, added aspirin.  Paroxysmal atrial fibrillation -Currently patient is rate controlled.  Continue Xarelto.  Continue amiodarone  Essential hypertension -On Norvasc 2.5 mg daily, Lasix 20 mg daily  Hyperlipidemia - Slightly elevated LDL, will increase Lipitor to 40 mg daily  GERD -PPI  DVT prophylaxis: Xarelto Code Status: Full code Family Communication: None at bedside Disposition Plan: We will discharge the patient once cleared by Neurology. Should be able to follow up Dr Everlena Cooper.   Consultants:   Neurology  Procedures:   None   Antimicrobials:   None   Subjective: Reports he still feels a little off at times especially when she tries to ambulate.  Otherwise overall slightly better.  Review of Systems Otherwise negative except as per HPI,  including: General: Denies fever, chills, night sweats or unintended weight loss. Resp: Denies cough, wheezing, shortness of breath. Cardiac: Denies chest pain, palpitations, orthopnea, paroxysmal nocturnal dyspnea. GI: Denies abdominal pain, nausea, vomiting, diarrhea or constipation GU: Denies dysuria, frequency, hesitancy or incontinence MS: Denies muscle aches, joint pain or swelling Neuro: Denies headache, neurologic deficits (focal weakness, numbness, tingling), abnormal gait Psych: Denies anxiety, depression, SI/HI/AVH Skin: Denies new rashes or lesions ID: Denies sick contacts, exotic exposures, travel  Objective: Vitals:   11/27/18 0456 11/27/18 0457 11/27/18 0812 11/27/18 1153  BP:   (!) 168/71 (!) 131/57  Pulse:   60 (!) 53  Resp: 18 (!) 22 18 18   Temp:    97.8 F (36.6 C)  TempSrc:   Oral Oral  SpO2:   99% 99%  Weight:      Height:        Intake/Output Summary (Last 24 hours) at 11/27/2018 1349 Last data filed at 11/27/2018 0600 Gross per 24 hour  Intake 120 ml  Output -  Net 120 ml   Filed Weights   11/26/18 1509 11/26/18 2251  Weight: 46.7 kg 49.2 kg    Examination:  General exam: Appears calm and comfortable  Respiratory system: Clear to auscultation. Respiratory effort normal. Cardiovascular system: S1 & S2 heard, RRR. No JVD, murmurs, rubs, gallops or clicks. No pedal edema. Gastrointestinal system: Abdomen is nondistended, soft and nontender. No organomegaly or masses felt. Normal bowel sounds heard. Central nervous system: Alert and oriented. No focal neurological deficits. Extremities: Symmetric 4 x 5 power. Skin: No rashes, lesions or ulcers Psychiatry: Judgement and insight appear normal. Mood & affect appropriate.     Data Reviewed:  CBC: Recent Labs  Lab 11/26/18 1521  WBC 5.8  HGB 12.5  HCT 38.6  MCV 90.4  PLT 262   Basic Metabolic Panel: Recent Labs  Lab 11/26/18 1521  NA 136  K 4.2  CL 104  CO2 22  GLUCOSE 91  BUN 10   CREATININE 0.86  CALCIUM 9.1  MG 2.2   GFR: Estimated Creatinine Clearance: 32.3 mL/min (by C-G formula based on SCr of 0.86 mg/dL). Liver Function Tests: Recent Labs  Lab 11/26/18 1521  AST 32  ALT 18  ALKPHOS 65  BILITOT 0.4  PROT 7.3  ALBUMIN 3.5   Recent Labs  Lab 11/26/18 1521  LIPASE 31   No results for input(s): AMMONIA in the last 168 hours. Coagulation Profile: Recent Labs  Lab 11/26/18 1521  INR 1.42   Cardiac Enzymes: Recent Labs  Lab 11/26/18 2336 11/27/18 0637 11/27/18 1031  TROPONINI <0.03 <0.03 <0.03   BNP (last 3 results) No results for input(s): PROBNP in the last 8760 hours. HbA1C: Recent Labs    11/27/18 0637  HGBA1C 5.4   CBG: No results for input(s): GLUCAP in the last 168 hours. Lipid Profile: Recent Labs    11/27/18 0637  CHOL 223*  HDL 74  LDLCALC 130*  TRIG 93  CHOLHDL 3.0   Thyroid Function Tests: No results for input(s): TSH, T4TOTAL, FREET4, T3FREE, THYROIDAB in the last 72 hours. Anemia Panel: No results for input(s): VITAMINB12, FOLATE, FERRITIN, TIBC, IRON, RETICCTPCT in the last 72 hours. Sepsis Labs: No results for input(s): PROCALCITON, LATICACIDVEN in the last 168 hours.  Recent Results (from the past 240 hour(s))  Urine culture     Status: None (Preliminary result)   Collection Time: 11/26/18  4:34 PM  Result Value Ref Range Status   Specimen Description URINE, CLEAN CATCH  Final   Special Requests   Final    NONE Performed at [facility], [address], [place], [place] [address]    Culture PENDING  Incomplete   Report Status PENDING  Incomplete         Radiology Studies: Ct Angio Head W Or Wo Contrast  Result Date: 11/26/2018 CLINICAL DATA:  85 year old female with headache, dizziness, vertical diplopia, chest pain, back pain, hypertensive. EXAM: CT ANGIOGRAPHY HEAD AND NECK TECHNIQUE: Multidetector CT imaging of the head and neck was performed using the standard protocol during bolus  administration of intravenous contrast. Multiplanar CT image reconstructions and MIPs were obtained to evaluate the vascular anatomy. Carotid stenosis measurements (when applicable) are obtained utilizing NASCET criteria, using the distal internal carotid diameter as the denominator. CONTRAST:  80mL ISOVUE-370 IOPAMIDOL (ISOVUE-370) INJECTION 76% COMPARISON:  Chest CTA today reported separately. Head face and cervical spine CTs 06/16/2016 and 03/02/2016. Brain MRI 01/16/2016. FINDINGS: CT HEAD Brain: Stable cerebral volume with no intracranial mass effect. No acute intracranial hemorrhage identified. No ventriculomegaly. Small chronic lacune in the left thalamus is stable. Elsewhere stable and normal for age gray-white matter differentiation. No cortically based acute infarct identified. Calvarium and skull base: Osteopenia. No acute osseous abnormality identified. Paranasal sinuses: Visualized paranasal sinuses and mastoids are stable and well pneumatized. Tympanic cavities remain clear. Orbits: No acute orbit or scalp soft tissue finding. CTA NECK Skeleton: Prior sternotomy. Osteopenia. No acute osseous abnormality in the head or neck. Upper chest: Chest CTA reported separately today. Other neck: Negative; no neck mass or lymphadenopathy. Aortic arch: Calcified aortic atherosclerosis. Three vessel arch configuration. Right carotid system: Mildly tortuous brachiocephalic artery and proximal  right CCA without stenosis. Minimal right CCA plaque without stenosis proximal to the bifurcation. Calcified plaque at the posterior right ICA origin and bulb without stenosis. Negative cervical right ICA distal to the bulb. Left carotid system: Normal left CCA origin. Mildly tortuous proximal left CCA. No plaque proximal to the bifurcation. Soft and calcified plaque at the left ICA origin and bulb resulting in less than 50 % stenosis with respect to the distal vessel. Otherwise minimal cervical left ICA mural irregularity  without stenosis to the skull base. Vertebral arteries: Soft and calcified plaque in the proximal right subclavian artery resulting in 65% stenosis proximal to the right vertebral artery origin. Calcified plaque about the right vertebral origin but no associated proximal right vertebral stenosis. Patent right vertebral artery to the skull base with mild tortuosity, no stenosis. Mild proximal left subclavian artery plaque without stenosis. Calcified plaque near the left vertebral artery origin but no origin stenosis. Tortuous left V1 segment. Patent left vertebral artery to the skull base without stenosis. CTA HEAD Posterior circulation: Bulky calcified plaque in the right V4 segment with moderate to severe stenosis (series 13, image 144). This is proximal to the right PICA origin which remains patent. No other right vertebral stenosis to the vertebrobasilar junction. Contralateral proximal left V4 calcified plaque with only mild stenosis. Normal left PICA origin and vertebrobasilar junction. Patent basilar artery without stenosis. Normal SCA and left PCA origins. Fetal type right PCA origin. Bilateral PCA branches are within normal limits. Anterior circulation:  Both ICA siphons are patent. On the left there is moderate calcified plaque through the cavernous segment and bulky supraclinoid calcified plaque with severe stenosis just distal to the left ophthalmic artery origin (series 13, image 105). The left carotid terminus remains patent. On the right there is mild to moderate calcified plaque without stenosis. Normal right ophthalmic and posterior communicating artery origins. Patent carotid termini. Normal MCA and ACA origins. Dominant right A1 segment. Anterior communicating artery and bilateral ACA branches are within normal limits. Left MCA M1 segment, bifurcation, and left MCA branches are within normal limits. Right MCA M1 segment, bifurcation, and right MCA branches are within normal limits. Venous sinuses:  Patent on the delayed images. Anatomic variants: Fetal type right PCA origin. Dominant right ACA A1 segment. Delayed phase: No abnormal enhancement identified. Review of the MIP images confirms the above findings IMPRESSION: 1. Negative for large vessel occlusion or arterial dissection in the head or neck. 2. Positive for Severe stenoses of the distal Left ICA Siphon and the distal Right Vertebral Artery due to bulky calcified plaque. 3. Bulky plaque also in the proximal Right Subclavian Artery with 65% stenosis. Cervical carotid atherosclerosis without stenosis. 4. Stable CT appearance of the brain since 2017 with no acute intracranial abnormality. 5. CTA chest today reported separately. Electronically Signed   By: Odessa Fleming M.D.   On: 11/26/2018 18:04   Dg Chest 2 View  Result Date: 11/26/2018 CLINICAL DATA:  Pt here from independent living. Pt has been having chest pain with left arm numbness since 10-10-2023. No chest pain currently but her left arm has felt "dead" since 10-10-2023. She feels decreased sensation to the left hand. Endorses shortness of breath with exertion. Mild headache to left side. EXAM: CHEST - 2 VIEW COMPARISON:  01/05/2017 FINDINGS: Stable changes from prior CABG surgery. Cardiac silhouette top-normal in size. No mediastinal or hilar masses. No evidence of adenopathy. Lungs are hyperexpanded. There are prominent bronchovascular markings. Linear scarring noted at the left lung  base. No evidence of pneumonia or pulmonary edema. No pleural effusion or pneumothorax. Chronic lower thoracic spine mild compression fracture. Skeletal structures are diffusely demineralized. IMPRESSION: 1. No acute cardiopulmonary disease. 2. Stable changes from prior CABG surgery. 3. Lung hyperexpansion consistent with COPD. Electronically Signed   By: Amie Portland M.D.   On: 11/26/2018 16:26   Ct Angio Neck W And/or Wo Contrast  Result Date: 11/26/2018 CLINICAL DATA:  85 year old female with headache, dizziness,  vertical diplopia, chest pain, back pain, hypertensive. EXAM: CT ANGIOGRAPHY HEAD AND NECK TECHNIQUE: Multidetector CT imaging of the head and neck was performed using the standard protocol during bolus administration of intravenous contrast. Multiplanar CT image reconstructions and MIPs were obtained to evaluate the vascular anatomy. Carotid stenosis measurements (when applicable) are obtained utilizing NASCET criteria, using the distal internal carotid diameter as the denominator. CONTRAST:  80mL ISOVUE-370 IOPAMIDOL (ISOVUE-370) INJECTION 76% COMPARISON:  Chest CTA today reported separately. Head face and cervical spine CTs 06/16/2016 and 03/02/2016. Brain MRI 01/16/2016. FINDINGS: CT HEAD Brain: Stable cerebral volume with no intracranial mass effect. No acute intracranial hemorrhage identified. No ventriculomegaly. Small chronic lacune in the left thalamus is stable. Elsewhere stable and normal for age gray-white matter differentiation. No cortically based acute infarct identified. Calvarium and skull base: Osteopenia. No acute osseous abnormality identified. Paranasal sinuses: Visualized paranasal sinuses and mastoids are stable and well pneumatized. Tympanic cavities remain clear. Orbits: No acute orbit or scalp soft tissue finding. CTA NECK Skeleton: Prior sternotomy. Osteopenia. No acute osseous abnormality in the head or neck. Upper chest: Chest CTA reported separately today. Other neck: Negative; no neck mass or lymphadenopathy. Aortic arch: Calcified aortic atherosclerosis. Three vessel arch configuration. Right carotid system: Mildly tortuous brachiocephalic artery and proximal right CCA without stenosis. Minimal right CCA plaque without stenosis proximal to the bifurcation. Calcified plaque at the posterior right ICA origin and bulb without stenosis. Negative cervical right ICA distal to the bulb. Left carotid system: Normal left CCA origin. Mildly tortuous proximal left CCA. No plaque proximal to the  bifurcation. Soft and calcified plaque at the left ICA origin and bulb resulting in less than 50 % stenosis with respect to the distal vessel. Otherwise minimal cervical left ICA mural irregularity without stenosis to the skull base. Vertebral arteries: Soft and calcified plaque in the proximal right subclavian artery resulting in 65% stenosis proximal to the right vertebral artery origin. Calcified plaque about the right vertebral origin but no associated proximal right vertebral stenosis. Patent right vertebral artery to the skull base with mild tortuosity, no stenosis. Mild proximal left subclavian artery plaque without stenosis. Calcified plaque near the left vertebral artery origin but no origin stenosis. Tortuous left V1 segment. Patent left vertebral artery to the skull base without stenosis. CTA HEAD Posterior circulation: Bulky calcified plaque in the right V4 segment with moderate to severe stenosis (series 13, image 144). This is proximal to the right PICA origin which remains patent. No other right vertebral stenosis to the vertebrobasilar junction. Contralateral proximal left V4 calcified plaque with only mild stenosis. Normal left PICA origin and vertebrobasilar junction. Patent basilar artery without stenosis. Normal SCA and left PCA origins. Fetal type right PCA origin. Bilateral PCA branches are within normal limits. Anterior circulation:  Both ICA siphons are patent. On the left there is moderate calcified plaque through the cavernous segment and bulky supraclinoid calcified plaque with severe stenosis just distal to the left ophthalmic artery origin (series 13, image 105). The left carotid terminus remains patent.  On the right there is mild to moderate calcified plaque without stenosis. Normal right ophthalmic and posterior communicating artery origins. Patent carotid termini. Normal MCA and ACA origins. Dominant right A1 segment. Anterior communicating artery and bilateral ACA branches are within  normal limits. Left MCA M1 segment, bifurcation, and left MCA branches are within normal limits. Right MCA M1 segment, bifurcation, and right MCA branches are within normal limits. Venous sinuses: Patent on the delayed images. Anatomic variants: Fetal type right PCA origin. Dominant right ACA A1 segment. Delayed phase: No abnormal enhancement identified. Review of the MIP images confirms the above findings IMPRESSION: 1. Negative for large vessel occlusion or arterial dissection in the head or neck. 2. Positive for Severe stenoses of the distal Left ICA Siphon and the distal Right Vertebral Artery due to bulky calcified plaque. 3. Bulky plaque also in the proximal Right Subclavian Artery with 65% stenosis. Cervical carotid atherosclerosis without stenosis. 4. Stable CT appearance of the brain since 2017 with no acute intracranial abnormality. 5. CTA chest today reported separately. Electronically Signed   By: Odessa Fleming M.D.   On: 11/26/2018 18:04   Mr Brain Wo Contrast  Result Date: 11/27/2018 CLINICAL DATA:  Headache for 3 days. Transient diplopia and left arm weakness. Left arm numbness in the hand. EXAM: MRI HEAD WITHOUT CONTRAST TECHNIQUE: Multiplanar, multiecho pulse sequences of the brain and surrounding structures were obtained without intravenous contrast. COMPARISON:  Head CT 11/26/2018 and MRI 01/16/2016 FINDINGS: Brain: There is no evidence of acute infarct, intracranial hemorrhage, mass, midline shift, or extra-axial fluid collection. Mild cerebral atrophy is within normal limits for age. Chronic lacunar infarcts are again seen in the left thalamus and cerebellum. Patchy T2 hyperintensities in the cerebral white matter bilaterally are similar to the prior MRI and nonspecific but compatible with mild-to-moderate chronic small vessel ischemic disease. Vascular: Major intracranial vascular flow voids are preserved. Skull and upper cervical spine: Unremarkable bone marrow signal. Sinuses/Orbits: Bilateral  cataract extraction. Mild left maxillary sinus mucosal thickening. Small mastoid effusions. Other: None. IMPRESSION: 1. No acute intracranial abnormality. 2. Mild-to-moderate chronic small vessel ischemic disease. Electronically Signed   By: Sebastian Ache M.D.   On: 11/27/2018 07:05   Ct Angio Chest Aorta W And/or Wo Contrast  Result Date: 11/26/2018 CLINICAL DATA:  85 year old female with acute chest pain EXAM: CT ANGIOGRAPHY CHEST WITH CONTRAST TECHNIQUE: Multidetector CT imaging of the chest was performed using the standard protocol during bolus administration of intravenous contrast. Multiplanar CT image reconstructions and MIPs were obtained to evaluate the vascular anatomy. CONTRAST:  80mL ISOVUE-370 IOPAMIDOL (ISOVUE-370) INJECTION 76% COMPARISON:  11/26/2018 chest radiograph and prior studies FINDINGS: Cardiovascular: Cardiomegaly, CABG changes and LEFT atrial appendage clip again noted. Coronary artery and aortic atherosclerotic calcifications are identified without evidence of thoracic aortic aneurysm or dissection. No pulmonary emboli or pericardial effusion identified. Mediastinum/Nodes: No enlarged mediastinal, hilar, or axillary lymph nodes. Thyroid gland, trachea, and esophagus demonstrate no significant findings. Lungs/Pleura: Mild bilateral ground-glass opacities in a mosaic type pattern probably represents small airway disease or possibly small vessel disease. No airspace disease, consolidation, mass, nodule, pleural effusion or pneumothorax noted. Mild bibasilar atelectasis/scarring noted. Upper Abdomen: No acute abnormality. Musculoskeletal: No acute or suspicious bony abnormalities. T10 compression fracture is unchanged. Review of the MIP images confirms the above findings. IMPRESSION: 1. No evidence of thoracic aortic aneurysm/dissection or pulmonary emboli. 2. Cardiomegaly and CABG changes. 3. Mild bilateral ground-glass opacities in a mosaic type pattern probably representing small airway  disease. Mild  bibasilar atelectasis/scarring 4.  Aortic Atherosclerosis (ICD10-I70.0). Electronically Signed   By: Harmon Pier M.D.   On: 11/26/2018 18:00        Scheduled Meds: . amiodarone  100 mg Oral Daily  . amLODipine  2.5 mg Oral Daily  . atorvastatin  20 mg Oral q1800  . furosemide  20 mg Oral Daily  . levothyroxine  50 mcg Oral QAC breakfast  . magnesium oxide  400 mg Oral Daily  . Melatonin  3 mg Oral QHS  . pantoprazole  40 mg Oral Daily  . psyllium  1 packet Oral Daily  . Rivaroxaban  15 mg Oral Q supper   Continuous Infusions:   LOS: 0 days   Time spent= 25 mins    Metta Koranda Joline Maxcy, MD [facility]  If 7PM-7AM, please contact night-coverage [email or web address] 11/27/2018, 1:49 PM

## 2018-11-27 NOTE — Progress Notes (Signed)
ANTICOAGULATION CONSULT NOTE - Initial Consult  Pharmacy Consult for apixaban Indication: atrial fibrillation  Allergies  Allergen Reactions  . Alendronate Sodium Other (See Comments)    Jaw pain  . Amoxicillin Other (See Comments)    Nausea, dizziness Nausea, dizziness  . Codeine Nausea And Vomiting  . Fosamax [Alendronate Sodium] Other (See Comments)    Jaw pain  . Gluten Meal Other (See Comments)    Celiac Disease Celiac Disease  . Hydrochlorothiazide Other (See Comments)    Hyponatremia, GI upset Hyponatremia, GI upset  . Tetanus Toxoid, Adsorbed Other (See Comments)    Bad local reaction  . Tetanus Toxoids Other (See Comments)    Bad local reaction    Patient Measurements: Height: 5' 1.5" (156.2 cm) Weight: 108 lb 7.5 oz (49.2 kg) IBW/kg (Calculated) : 48.95   Vital Signs: Temp: 97.8 F (36.6 C) (02/03 1153) Temp Source: Oral (02/03 1153) BP: 131/57 (02/03 1153) Pulse Rate: 53 (02/03 1153)  Labs: Recent Labs    11/26/18 1521 11/26/18 2336 11/27/18 0637 11/27/18 1031  HGB 12.5  --   --   --   HCT 38.6  --   --   --   PLT 262  --   --   --   LABPROT 17.2*  --   --   --   INR 1.42  --   --   --   CREATININE 0.86  --   --   --   TROPONINI  --  <0.03 <0.03 <0.03    Estimated Creatinine Clearance: 32.3 mL/min (by C-G formula based on SCr of 0.86 mg/dL).   Medical History: Past Medical History:  Diagnosis Date  . Anemia   . Atonic bladder 12/11/2013  . Atrial fibrillation ([place])   . Carotid artery occlusion    right carotid stenosis 40-60%, 4/08, neg for stenosis repeat study 11/11  . Celiac disease   . Chronic anticoagulation CHADS2VASC2 score 7 10/18/2014  . Chronic diastolic CHF (congestive heart failure) (HCC)    takes Lasix daily  . CKD (chronic kidney disease), stage III ([place])   . Coronary artery disease    a.  08/2013 (LIMA-LAD, rSVG to LCX and Lt atrial clip),  . Depression   . DVT (deep venous thrombosis) ([place]) 09/2013  . Gastric ulcer     6-34month ago  . GERD (gastroesophageal reflux disease)    takes Omeprazole daily  . GI bleeding   . Hemorrhoids   . Hot flashes    takes ZOloft 3 times a week  . Hypertension   . Hypothyroidism    takes Synthroid daily as result of AMiodarone  . Insomnia   . Macular degeneration   . Mild aortic insufficiency    a. Echo 10/2013: mild AI.  .Marland KitchenModerate mitral regurgitation by prior echocardiogram    a. Echo 10/2013.  . Moderate obstructive sleep apnea    uses CPAP;sleep study about 4-580monthago  . MVP (mitral valve prolapse)   . Osteoporosis   . PAF (paroxysmal atrial fibrillation) ([place]  . Peripheral edema    left  . Presbycusis   . Pulmonary hypertension ([place]  . RLS (restless legs syndrome)   . Thyroid nodule    45m48mno change 11/11  . Urethral stricture   . Urinary anastomotic stricture   . Urinary retention   . Vitamin D deficiency     Assessment: [age] female admitted with possible R brain TIA on rivaroxaban. Patient on rivaroxaban PTA  for  afib, but not taking consistently with a meal or at same time of day per notes. MD wishes to change patient to apixaban. Patient weight <60kg and age >63, therefore will place on 2.47m PO BID to start this PM.  Goal of Therapy:  Monitor platelets by anticoagulation protocol: Yes   Plan:  Apixaban 2.563mPO BID this PM Monitor for bleeding  Terry Bolotin A. PiLevada DyPharmD, BCSt. Paulager: [phone] utilize Amion for appropriate phone number to reach the unit pharmacist ([facility]  11/27/2018,2:20 PM

## 2018-11-27 NOTE — Progress Notes (Addendum)
Patient hesitant about starting aspirin because cardiologist said to not take it after CABG. MD notified.  Educated patient on need for aspirin, still refusing. MD aware

## 2018-11-27 NOTE — Progress Notes (Signed)
  Echocardiogram 2D Echocardiogram has been performed.  Jennette Dubin 11/27/2018, 11:36 AM

## 2018-11-27 NOTE — Evaluation (Signed)
Occupational Therapy Evaluation Patient Details Name: Maria [name] MRN: [id number] DOB: 03-26-1924 Today's Date: 11/27/2018    History of Present Illness 85 y.o. female with past medical history of hypertension, atrial fibrillation on Xarelto, right carotid stenosis, coronary artery disease, CKD, chronic diastolic heart failure presents to the ER with 3-day history of headache as well as multiple complaints the setting of elevated blood pressure.   Clinical Impression   Patient presenting with decreased I in self care, balance, functional transfer/mobility, strength, endurance, and safety awareness. Patient lives at independent living facility and ambulates in community with cane PTA. Pt drove to dinning area before and biggest concern is walking to dinning room after discharge.  Patient currently functioning at min guard but progressing to supervision this session without use of AD. Patient will benefit from acute OT to increase overall independence in the areas of ADLs, functional mobility, and safety awareness in order to safely discharge home.    Follow Up Recommendations  Home health OT    Equipment Recommendations  None recommended by OT    Recommendations for Other Services Other (comment)(none at this time)     Precautions / Restrictions Precautions Precautions: Fall Restrictions Weight Bearing Restrictions: No      Mobility Bed Mobility Overal bed mobility: Needs Assistance Bed Mobility: Supine to Sit;Sit to Supine     Supine to sit: Supervision Sit to supine: Supervision   General bed mobility comments: verbal cuing for hand placement and safety  Transfers Overall transfer level: Needs assistance   Transfers: Sit to/from Stand;Stand Pivot Transfers Sit to Stand: Supervision;Min guard Stand pivot transfers: Supervision;Min guard       General transfer comment: min guard progressing to supervision    Balance Overall balance assessment: Needs  assistance Sitting-balance support: Feet supported Sitting balance-Leahy Scale: Good     Standing balance support: During functional activity Standing balance-Leahy Scale: Fair Standing balance comment: min guard          ADL either performed or assessed with clinical judgement   ADL Overall ADL's : Needs assistance/impaired     Grooming: Supervision/safety;Standing   Upper Body Bathing: Set up;Sitting   Lower Body Bathing: Min guard;Sit to/from stand   Upper Body Dressing : Supervision/safety;Standing   Lower Body Dressing: Sit to/from stand;Min guard   Toilet Transfer: Min guard   Toileting- Clothing Manipulation and Hygiene: Min guard               Vision Baseline Vision/History: Wears glasses Wears Glasses: Reading only Patient Visual Report: No change from baseline              Pertinent Vitals/Pain Pain Assessment: No/denies pain     Hand Dominance Right   Extremity/Trunk Assessment Upper Extremity Assessment Upper Extremity Assessment: Generalized weakness   Lower Extremity Assessment Lower Extremity Assessment: Defer to PT evaluation       Communication Communication Communication: HOH   Cognition Arousal/Alertness: Awake/alert Behavior During Therapy: WFL for tasks assessed/performed Overall Cognitive Status: Within Functional Limits for tasks assessed                 Home Living Family/patient expects to be discharged to:: Other (Comment)(independent living facilitiy ) Living Arrangements: Alone Available Help at Discharge: Available PRN/intermittently Type of Home: Independent living facility Home Access: Level entry     Home Layout: One level     Bathroom Shower/Tub: Producer, television/film/video: Standard Bathroom Accessibility: Yes How Accessible: Accessible via walker Home Equipment: Cane - quad  Additional Comments: Pt verbalized using quad cane in community      Prior Functioning/Environment Level of  Independence: Independent                 OT Problem List: Decreased strength;Impaired balance (sitting and/or standing);Decreased safety awareness;Decreased activity tolerance;Decreased coordination;Decreased knowledge of use of DME or AE;Pain      OT Treatment/Interventions: Self-care/ADL training;Patient/family education;Therapeutic exercise;Neuromuscular education;Balance training;Energy conservation;Therapeutic activities;DME and/or AE instruction    OT Goals(Current goals can be found in the care plan section) Acute Rehab OT Goals Patient Stated Goal: to go home OT Goal Formulation: With patient Time For Goal Achievement: 12/11/18 Potential to Achieve Goals: Good ADL Goals Pt Will Perform Grooming: with modified independence Pt Will Perform Upper Body Bathing: with modified independence Pt Will Perform Lower Body Bathing: with modified independence Pt Will Perform Upper Body Dressing: with modified independence Pt Will Perform Lower Body Dressing: with modified independence Pt Will Transfer to Toilet: with modified independence Pt Will Perform Toileting - Clothing Manipulation and hygiene: with modified independence  OT Frequency: Min 2X/week   Barriers to D/C: Decreased caregiver support             AM-PAC OT "6 Clicks" Daily Activity     Outcome Measure Help from another person eating meals?: None Help from another person taking care of personal grooming?: A Little Help from another person toileting, which includes using toliet, bedpan, or urinal?: A Little Help from another person bathing (including washing, rinsing, drying)?: A Little Help from another person to put on and taking off regular upper body clothing?: A Little Help from another person to put on and taking off regular lower body clothing?: A Little 6 Click Score: 19   End of Session Equipment Utilized During Treatment: Other (comment)(none used) Nurse Communication: Mobility status  Activity  Tolerance: Patient tolerated treatment well Patient left: in bed;with call bell/phone within reach;with bed alarm set  OT Visit Diagnosis: Unsteadiness on feet (R26.81)                Time: 5621-3086 OT Time Calculation (min): 23 min Charges:  OT General Charges $OT Visit: 1 Visit OT Evaluation $OT Eval Low Complexity: 1 Low OT Treatments $Self Care/Home Management : 8-22 mins   Tian Davison P, MS, OTR/L 11/27/2018, 9:33 AM

## 2018-11-27 NOTE — Progress Notes (Addendum)
STROKE TEAM PROGRESS NOTE   INTERVAL HISTORY Her daughter is at the bedside.  She recounted HPI with Dr. Leonie Man. She really came to the hospital more because of her CP. She does report some horizontal double vision.   Vitals:   11/27/18 0456 11/27/18 0457 11/27/18 0812 11/27/18 1153  BP:   (!) 168/71 (!) 131/57  Pulse:   60 (!) 53  Resp: 18 (!) 22 18 18   Temp:    97.8 F (36.6 C)  TempSrc:   Oral Oral  SpO2:   99% 99%  Weight:      Height:        CBC:  Recent Labs  Lab 11/26/18 1521  WBC 5.8  HGB 12.5  HCT 38.6  MCV 90.4  PLT 154    Basic Metabolic Panel:  Recent Labs  Lab 11/26/18 1521  NA 136  K 4.2  CL 104  CO2 22  GLUCOSE 91  BUN 10  CREATININE 0.86  CALCIUM 9.1  MG 2.2   Lipid Panel:     Component Value Date/Time   CHOL 223 (H) 11/27/2018 0637   TRIG 93 11/27/2018 0637   HDL 74 11/27/2018 0637   CHOLHDL 3.0 11/27/2018 0637   VLDL 19 11/27/2018 0637   LDLCALC 130 (H) 11/27/2018 0637   HgbA1c:  Lab Results  Component Value Date   HGBA1C 5.4 11/27/2018   Urine Drug Screen: No results found for: LABOPIA, COCAINSCRNUR, LABBENZ, AMPHETMU, THCU, LABBARB  Alcohol Level No results found for: ETH  IMAGING Ct Angio Head W Or Wo Contrast  Result Date: 11/26/2018 CLINICAL DATA:  85 year old female with headache, dizziness, vertical diplopia, chest pain, back pain, hypertensive. EXAM: CT ANGIOGRAPHY HEAD AND NECK TECHNIQUE: Multidetector CT imaging of the head and neck was performed using the standard protocol during bolus administration of intravenous contrast. Multiplanar CT image reconstructions and MIPs were obtained to evaluate the vascular anatomy. Carotid stenosis measurements (when applicable) are obtained utilizing NASCET criteria, using the distal internal carotid diameter as the denominator. CONTRAST:  5m ISOVUE-370 IOPAMIDOL (ISOVUE-370) INJECTION 76% COMPARISON:  Chest CTA today reported separately. Head face and cervical spine CTs 06/16/2016 and  03/02/2016. Brain MRI 01/16/2016. FINDINGS: CT HEAD Brain: Stable cerebral volume with no intracranial mass effect. No acute intracranial hemorrhage identified. No ventriculomegaly. Small chronic lacune in the left thalamus is stable. Elsewhere stable and normal for age gray-white matter differentiation. No cortically based acute infarct identified. Calvarium and skull base: Osteopenia. No acute osseous abnormality identified. Paranasal sinuses: Visualized paranasal sinuses and mastoids are stable and well pneumatized. Tympanic cavities remain clear. Orbits: No acute orbit or scalp soft tissue finding. CTA NECK Skeleton: Prior sternotomy. Osteopenia. No acute osseous abnormality in the head or neck. Upper chest: Chest CTA reported separately today. Other neck: Negative; no neck mass or lymphadenopathy. Aortic arch: Calcified aortic atherosclerosis. Three vessel arch configuration. Right carotid system: Mildly tortuous brachiocephalic artery and proximal right CCA without stenosis. Minimal right CCA plaque without stenosis proximal to the bifurcation. Calcified plaque at the posterior right ICA origin and bulb without stenosis. Negative cervical right ICA distal to the bulb. Left carotid system: Normal left CCA origin. Mildly tortuous proximal left CCA. No plaque proximal to the bifurcation. Soft and calcified plaque at the left ICA origin and bulb resulting in less than 50 % stenosis with respect to the distal vessel. Otherwise minimal cervical left ICA mural irregularity without stenosis to the skull base. Vertebral arteries: Soft and calcified plaque in the proximal right  subclavian artery resulting in 65% stenosis proximal to the right vertebral artery origin. Calcified plaque about the right vertebral origin but no associated proximal right vertebral stenosis. Patent right vertebral artery to the skull base with mild tortuosity, no stenosis. Mild proximal left subclavian artery plaque without stenosis. Calcified  plaque near the left vertebral artery origin but no origin stenosis. Tortuous left V1 segment. Patent left vertebral artery to the skull base without stenosis. CTA HEAD Posterior circulation: Bulky calcified plaque in the right V4 segment with moderate to severe stenosis (series 13, image 144). This is proximal to the right PICA origin which remains patent. No other right vertebral stenosis to the vertebrobasilar junction. Contralateral proximal left V4 calcified plaque with only mild stenosis. Normal left PICA origin and vertebrobasilar junction. Patent basilar artery without stenosis. Normal SCA and left PCA origins. Fetal type right PCA origin. Bilateral PCA branches are within normal limits. Anterior circulation:  Both ICA siphons are patent. On the left there is moderate calcified plaque through the cavernous segment and bulky supraclinoid calcified plaque with severe stenosis just distal to the left ophthalmic artery origin (series 13, image 105). The left carotid terminus remains patent. On the right there is mild to moderate calcified plaque without stenosis. Normal right ophthalmic and posterior communicating artery origins. Patent carotid termini. Normal MCA and ACA origins. Dominant right A1 segment. Anterior communicating artery and bilateral ACA branches are within normal limits. Left MCA M1 segment, bifurcation, and left MCA branches are within normal limits. Right MCA M1 segment, bifurcation, and right MCA branches are within normal limits. Venous sinuses: Patent on the delayed images. Anatomic variants: Fetal type right PCA origin. Dominant right ACA A1 segment. Delayed phase: No abnormal enhancement identified. Review of the MIP images confirms the above findings IMPRESSION: 1. Negative for large vessel occlusion or arterial dissection in the head or neck. 2. Positive for Severe stenoses of the distal Left ICA Siphon and the distal Right Vertebral Artery due to bulky calcified plaque. 3. Bulky  plaque also in the proximal Right Subclavian Artery with 65% stenosis. Cervical carotid atherosclerosis without stenosis. 4. Stable CT appearance of the brain since 2017 with no acute intracranial abnormality. 5. CTA chest today reported separately. Electronically Signed   By: Genevie Ann M.D.   On: 11/26/2018 18:04   Dg Chest 2 View  Result Date: 11/26/2018 CLINICAL DATA:  Pt here from independent living. Pt has been having chest pain with left arm numbness since 12-28-2022. No chest pain currently but her left arm has felt "dead" since 2022-12-28. She feels decreased sensation to the left hand. Endorses shortness of breath with exertion. Mild headache to left side. EXAM: CHEST - 2 VIEW COMPARISON:  01/05/2017 FINDINGS: Stable changes from prior CABG surgery. Cardiac silhouette top-normal in size. No mediastinal or hilar masses. No evidence of adenopathy. Lungs are hyperexpanded. There are prominent bronchovascular markings. Linear scarring noted at the left lung base. No evidence of pneumonia or pulmonary edema. No pleural effusion or pneumothorax. Chronic lower thoracic spine mild compression fracture. Skeletal structures are diffusely demineralized. IMPRESSION: 1. No acute cardiopulmonary disease. 2. Stable changes from prior CABG surgery. 3. Lung hyperexpansion consistent with COPD. Electronically Signed   By: [name] M.D.   On: 11/26/2018 16:26   Ct Angio Neck W And/or Wo Contrast  Result Date: 11/26/2018 CLINICAL DATA:  85 year old female with headache, dizziness, vertical diplopia, chest pain, back pain, hypertensive. EXAM: CT ANGIOGRAPHY HEAD AND NECK TECHNIQUE: Multidetector CT imaging of the head  and neck was performed using the standard protocol during bolus administration of intravenous contrast. Multiplanar CT image reconstructions and MIPs were obtained to evaluate the vascular anatomy. Carotid stenosis measurements (when applicable) are obtained utilizing NASCET criteria, using the distal  internal carotid diameter as the denominator. CONTRAST:  68m ISOVUE-370 IOPAMIDOL (ISOVUE-370) INJECTION 76% COMPARISON:  Chest CTA today reported separately. Head face and cervical spine CTs 06/16/2016 and 03/02/2016. Brain MRI 01/16/2016. FINDINGS: CT HEAD Brain: Stable cerebral volume with no intracranial mass effect. No acute intracranial hemorrhage identified. No ventriculomegaly. Small chronic lacune in the left thalamus is stable. Elsewhere stable and normal for age gray-white matter differentiation. No cortically based acute infarct identified. Calvarium and skull base: Osteopenia. No acute osseous abnormality identified. Paranasal sinuses: Visualized paranasal sinuses and mastoids are stable and well pneumatized. Tympanic cavities remain clear. Orbits: No acute orbit or scalp soft tissue finding. CTA NECK Skeleton: Prior sternotomy. Osteopenia. No acute osseous abnormality in the head or neck. Upper chest: Chest CTA reported separately today. Other neck: Negative; no neck mass or lymphadenopathy. Aortic arch: Calcified aortic atherosclerosis. Three vessel arch configuration. Right carotid system: Mildly tortuous brachiocephalic artery and proximal right CCA without stenosis. Minimal right CCA plaque without stenosis proximal to the bifurcation. Calcified plaque at the posterior right ICA origin and bulb without stenosis. Negative cervical right ICA distal to the bulb. Left carotid system: Normal left CCA origin. Mildly tortuous proximal left CCA. No plaque proximal to the bifurcation. Soft and calcified plaque at the left ICA origin and bulb resulting in less than 50 % stenosis with respect to the distal vessel. Otherwise minimal cervical left ICA mural irregularity without stenosis to the skull base. Vertebral arteries: Soft and calcified plaque in the proximal right subclavian artery resulting in 65% stenosis proximal to the right vertebral artery origin. Calcified plaque about the right vertebral origin  but no associated proximal right vertebral stenosis. Patent right vertebral artery to the skull base with mild tortuosity, no stenosis. Mild proximal left subclavian artery plaque without stenosis. Calcified plaque near the left vertebral artery origin but no origin stenosis. Tortuous left V1 segment. Patent left vertebral artery to the skull base without stenosis. CTA HEAD Posterior circulation: Bulky calcified plaque in the right V4 segment with moderate to severe stenosis (series 13, image 144). This is proximal to the right PICA origin which remains patent. No other right vertebral stenosis to the vertebrobasilar junction. Contralateral proximal left V4 calcified plaque with only mild stenosis. Normal left PICA origin and vertebrobasilar junction. Patent basilar artery without stenosis. Normal SCA and left PCA origins. Fetal type right PCA origin. Bilateral PCA branches are within normal limits. Anterior circulation:  Both ICA siphons are patent. On the left there is moderate calcified plaque through the cavernous segment and bulky supraclinoid calcified plaque with severe stenosis just distal to the left ophthalmic artery origin (series 13, image 105). The left carotid terminus remains patent. On the right there is mild to moderate calcified plaque without stenosis. Normal right ophthalmic and posterior communicating artery origins. Patent carotid termini. Normal MCA and ACA origins. Dominant right A1 segment. Anterior communicating artery and bilateral ACA branches are within normal limits. Left MCA M1 segment, bifurcation, and left MCA branches are within normal limits. Right MCA M1 segment, bifurcation, and right MCA branches are within normal limits. Venous sinuses: Patent on the delayed images. Anatomic variants: Fetal type right PCA origin. Dominant right ACA A1 segment. Delayed phase: No abnormal enhancement identified. Review of the MIP images  confirms the above findings IMPRESSION: 1. Negative for  large vessel occlusion or arterial dissection in the head or neck. 2. Positive for Severe stenoses of the distal Left ICA Siphon and the distal Right Vertebral Artery due to bulky calcified plaque. 3. Bulky plaque also in the proximal Right Subclavian Artery with 65% stenosis. Cervical carotid atherosclerosis without stenosis. 4. Stable CT appearance of the brain since 2017 with no acute intracranial abnormality. 5. CTA chest today reported separately. Electronically Signed   By: Genevie Ann M.D.   On: 11/26/2018 18:04   Mr Brain Wo Contrast  Result Date: 11/27/2018 CLINICAL DATA:  Headache for 3 days. Transient diplopia and left arm weakness. Left arm numbness in the hand. EXAM: MRI HEAD WITHOUT CONTRAST TECHNIQUE: Multiplanar, multiecho pulse sequences of the brain and surrounding structures were obtained without intravenous contrast. COMPARISON:  Head CT 11/26/2018 and MRI 01/16/2016 FINDINGS: Brain: There is no evidence of acute infarct, intracranial hemorrhage, mass, midline shift, or extra-axial fluid collection. Mild cerebral atrophy is within normal limits for age. Chronic lacunar infarcts are again seen in the left thalamus and cerebellum. Patchy T2 hyperintensities in the cerebral white matter bilaterally are similar to the prior MRI and nonspecific but compatible with mild-to-moderate chronic small vessel ischemic disease. Vascular: Major intracranial vascular flow voids are preserved. Skull and upper cervical spine: Unremarkable bone marrow signal. Sinuses/Orbits: Bilateral cataract extraction. Mild left maxillary sinus mucosal thickening. Small mastoid effusions. Other: None. IMPRESSION: 1. No acute intracranial abnormality. 2. Mild-to-moderate chronic small vessel ischemic disease. Electronically Signed   By: Logan Bores M.D.   On: 11/27/2018 07:05   Ct Angio Chest Aorta W And/or Wo Contrast  Result Date: 11/26/2018 CLINICAL DATA:  85 year old female with acute chest pain EXAM: CT ANGIOGRAPHY CHEST  WITH CONTRAST TECHNIQUE: Multidetector CT imaging of the chest was performed using the standard protocol during bolus administration of intravenous contrast. Multiplanar CT image reconstructions and MIPs were obtained to evaluate the vascular anatomy. CONTRAST:  28m ISOVUE-370 IOPAMIDOL (ISOVUE-370) INJECTION 76% COMPARISON:  11/26/2018 chest radiograph and prior studies FINDINGS: Cardiovascular: Cardiomegaly, CABG changes and LEFT atrial appendage clip again noted. Coronary artery and aortic atherosclerotic calcifications are identified without evidence of thoracic aortic aneurysm or dissection. No pulmonary emboli or pericardial effusion identified. Mediastinum/Nodes: No enlarged mediastinal, hilar, or axillary lymph nodes. Thyroid gland, trachea, and esophagus demonstrate no significant findings. Lungs/Pleura: Mild bilateral ground-glass opacities in a mosaic type pattern probably represents small airway disease or possibly small vessel disease. No airspace disease, consolidation, mass, nodule, pleural effusion or pneumothorax noted. Mild bibasilar atelectasis/scarring noted. Upper Abdomen: No acute abnormality. Musculoskeletal: No acute or suspicious bony abnormalities. T10 compression fracture is unchanged. Review of the MIP images confirms the above findings. IMPRESSION: 1. No evidence of thoracic aortic aneurysm/dissection or pulmonary emboli. 2. Cardiomegaly and CABG changes. 3. Mild bilateral ground-glass opacities in a mosaic type pattern probably representing small airway disease. Mild bibasilar atelectasis/scarring 4.  Aortic Atherosclerosis (ICD10-I70.0). Electronically Signed   By: JMargarette CanadaM.D.   On: 11/26/2018 18:00    PHYSICAL EXAM Present frail elderly Caucasian lady not in distress. . Afebrile. Head is nontraumatic. Neck is supple without bruit.    Cardiac exam no murmur or gallop. Lungs are clear to auscultation. Distal pulses are well felt. Neurological Exam ;  Awake  Alert oriented x  3. Normal speech and language.eye movements full without nystagmus.fundi were not visualized. Vision acuity and fields appear normal. Hearing is diminished slightly bilaterally. Palatal movements are normal.  Face symmetric. Tongue midline. Normal strength, tone, reflexes and coordination. Normal sensation. Gait deferred.   ASSESSMENT/PLAN Ms. ELDORIS BEISER is a 85 y.o. female with history of hypertension, atrial fibrillation on Xarelto, right carotid stenosis, coronary artery disease, CKD, chronic diastolic heart failure presenting with 3-day history of headache, double visions, L-hand weakness, L hand numbness and generalized fatigue.   Possible Posterior circulation TIA d/t known  AF on Xarelto, not always taking with a meal  CTA head & neck no LVO. L ICA siphon, R VA, prox R SCA (65%) w/ severe stenosis. Stable CT since 2017.   MRI  No stroke. Small vessel disease.   2D Echo  pending   LDL 130  HgbA1c 5.4  Xarelto for VTE prophylaxis  Xarelto (rivaroxaban) daily prior to admission, now on Xarelto (rivaroxaban) daily. She was instructed to take daily, at the same time of the day (not delaying by hours) and with a large meal. She is unable to commit to taking xarelto consistently at the same time with a meal. Dr. Leonie Man recommends changing to Pradaxa or Elqius. Dr. [name] prescribed Xarelto initially. She wants his approval for changing drugs. Will go ahead and have pharmacy change to Eliquis. Dr. Leonie Man to discuss with Dr. Reesa Chew.   Therapy recommendations:  HH OT, PT eval pending   Disposition:  pending   Follow up with Dr. [name] at d/c   Atrial Fibrillation  Home anticoagulation:  Xarelto (rivaroxaban) daily continued in the hospital . Continue Xarelto (rivaroxaban) daily at discharge  Hypertension  Stable . BP goal normotensive  Hyperlipidemia  Home meds:  No statin  LDL 130, goal < 70  Add lipitor 20 given age  Continue statin at discharge  Other Stroke Risk  Factors  Advanced age  Moderate Obstructive sleep apnea, on CPAP at home  Chronic diastolic Congestive heart failure  Hx DVT   Carotid artery stenosis   Other Active Problems  Chest pain serial troponins neg  Pulm HTN, mod to severe  CKD stage III  Has been evaluated for mod to severe neuropathy, followed by Dr. Sherre Poot day # 0  Burnetta Sabin, MSN, APRN, ANVP-BC, AGPCNP-BC Advanced Practice Stroke Nurse [place] for Schedule & Pager information 11/27/2018 1:13 PM  I have personally obtained history,examined this patient, reviewed notes, independently viewed imaging studies, participated in medical decision making and plan of care.ROS completed by me personally and pertinent positives fully documented  I have made any additions or clarifications directly to the above note. Agree with note above.  She presented with transient double vision as well as left hand heaviness and numbness in the setting of chest pain and accelerated hypertension possible TIA.Marland Kitchenshe has not been taking Xarelto consistently at the same time and may be better served by switching to twice a day medication like eliquis. I discussed the risk-benefit and lack of definitive data proving 1 drug being better than the other. She wants a cardiologist notified about this change. Continue ongoing stroke workup and aggressive risk factor modification. Discussed with Dr. Reesa Chew. Greater than 50% time during this 35 minute visit was spent on counseling and coordination of care about TIA, atrial fibrillation and discussion about stroke prevention and treatment and answering questions.follow-up as an outpatient with her neurologist Dr. Gale Journey, MD Medical Director [place] Pager: [phone] 11/27/2018 3:31 PM  To contact Stroke Continuity provider, please refer to [email or web address] After hours, contact General Neurology

## 2018-11-27 NOTE — Progress Notes (Signed)
SLP Cancellation Note  Patient Details Name: Maria [name] MRN: [id number] DOB: July 04, 1924   Cancelled treatment:       Reason Eval/Treat Not Completed: SLP screened, no needs identified, will sign off. Pt was encouraged to notify PCP if difficulties arise upon return to normal routines.  Maria [name] B. Quentin Ore [facility], CCC-SLP Speech Language Pathologist [phone]  Shonna Chock 11/27/2018, 2:39 PM

## 2018-11-27 NOTE — Care Management Obs Status (Signed)
MEDICARE OBSERVATION STATUS NOTIFICATION   Patient Details  Name: AVRY MONTELEONE MRN: [id number] Date of Birth: [birth year]   Medicare Observation Status Notification Given:  Yes    Pollie Friar, RN 11/27/2018, 3:33 PM

## 2018-11-28 DIAGNOSIS — I63542 Cerebral infarction due to unspecified occlusion or stenosis of left cerebellar artery: Secondary | ICD-10-CM | POA: Diagnosis not present

## 2018-11-28 DIAGNOSIS — R079 Chest pain, unspecified: Secondary | ICD-10-CM | POA: Diagnosis not present

## 2018-11-28 DIAGNOSIS — I10 Essential (primary) hypertension: Secondary | ICD-10-CM | POA: Diagnosis not present

## 2018-11-28 DIAGNOSIS — I272 Pulmonary hypertension, unspecified: Secondary | ICD-10-CM | POA: Diagnosis not present

## 2018-11-28 DIAGNOSIS — I639 Cerebral infarction, unspecified: Secondary | ICD-10-CM | POA: Diagnosis not present

## 2018-11-28 MED ORDER — APIXABAN 2.5 MG PO TABS: 2.5000 mg | ORAL_TABLET | Freq: Two times a day (BID) | ORAL | 0 refills | Status: DC

## 2018-11-28 MED ORDER — ATORVASTATIN CALCIUM 40 MG PO TABS: 40.0000 mg | ORAL_TABLET | Freq: Every day | ORAL | 0 refills | Status: DC

## 2018-11-28 MED ORDER — AMLODIPINE BESYLATE 2.5 MG PO TABS: 2.5000 mg | ORAL_TABLET | Freq: Every day | ORAL | 0 refills | Status: DC

## 2018-11-28 MED ORDER — ASPIRIN 81 MG PO TBEC: 81.0000 mg | DELAYED_RELEASE_TABLET | Freq: Every day | ORAL | 0 refills | Status: AC

## 2018-11-28 NOTE — Care Management Note (Signed)
Case Management Note  Patient Details  Name: SHY GUALLPA MRN: [id number] Date of Birth: Nov 25, 1923  Subjective/Objective:                    Action/Plan: Pt discharging back to [facility]. Therapy orders faxed to [facility] yesterday.  Daughter to provide transportation home.  Expected Discharge Date:  11/28/18               Expected Discharge Plan:  OP Rehab  In-House Referral:     Discharge planning Services  CM Consult  Post Acute Care Choice:    Choice offered to:     DME Arranged:    DME Agency:     HH Arranged:    HH Agency:     Status of Service:  Completed, signed off  If discussed at [employer] of Stay Meetings, dates discussed:    Additional Comments:  Pollie Friar, RN 11/28/2018, 11:38 AM

## 2018-11-28 NOTE — Progress Notes (Signed)
Patient and family given discharge instructions, verbalized understanding and able to teach back. MD paged for questions regarding eliquis and test results. IV and tele removed. Pt waiting for MD before leaving.

## 2018-11-28 NOTE — Discharge Summary (Signed)
Physician Discharge Summary  Maria [name] DXI:[id number] DOB: [birth year] DOA: 11/26/2018  PCP: [name], MD  Admit date: 11/26/2018 Discharge date: 11/28/2018  Admitted From: Home Disposition: Home  Recommendations for Outpatient Follow-up:  1. Follow up with PCP in 1-2 weeks 2. Please obtain BMP/CBC in one week your next doctors visit.  3. Follow-up outpatient with neurology, Dr. [name] in 3-4 weeks 4. Xarelto has been changed to Eliquis to be taken twice daily.  I have sent a staff message to patient's cardiologist, Dr. [name]  Home Health: Occupational Therapy Equipment/Devices: None Discharge Condition: Stable CODE STATUS: DNR Diet recommendation: Heart healthy  Brief/Interim Summary: 85 year old with history of essential hypertension, atrial fibrillation on Xarelto, right carotid artery stenosis, coronary artery disease, chronic diastolic congestive heart failure, CKD stage II came to the hospital with complains of left-sided weakness, diplopia.  She was admitted to the hospital for routine CVA work-up.During the hospitalization MRI showed no infarction and the CTA was positive for left ICA stenosis.  There was still high possibility of TIA secondary to posterior circulation interruption.  Due to inconsistencies in taking her Xarelto at home at various different times, it was switched to Eliquis to provide better anticoagulation coverage throughout the day per neurology.  Lipitor was increased to 40 mg daily.  Aspirin daily was added as well.  Echocardiogram showed ejection fraction 60 to 65% with moderately dilated left-sided atrium.   Discharge Diagnoses:  Principal Problem:   CVA (cerebral vascular accident) ([place]) Active Problems:   Essential hypertension, benign   Paroxysmal atrial fibrillation ([place])   Pulmonary hypertension, moderate to severe (HCC)   CKD (chronic kidney disease), stage III (HCC)   Chest pain  Left-sided weakness and diplopia, improved Concerns for  posterior circulation TIA. -MRI of the brain is negative for acute infarction - CTA shows left ICA stenosis, she would benefit from outpatient evaluation by vascular surgery but patient is hesitant to proceed with any sort of surgical intervention at this time - A1c is 5.4, LDL 130. -Echocardiogram -ejection fraction 55% with dilated left atrium -Increase Lipitor to 40 mg daily - Due to inconsistencies in taking her Xarelto at various times daily, it has been switched to Eliquis to provide more consistent anticoagulation per neurology recommendations.  Have also sent a message to Dr. Daneen Schick her primary cardiologist at the patient's request. -Daily aspirin  Paroxysmal atrial fibrillation -Currently patient is rate controlled.  Xarelto changed to Eliquis twice daily 2.5 mg twice daily.  Continue amiodarone  Essential hypertension -On Norvasc 2.5 mg daily, Lasix 20 mg daily  Hyperlipidemia - Slightly elevated LDL, will increase Lipitor to 40 mg daily  GERD -PPI  DVT prophylaxis: Xarelto changed to Eliquis Code Status: Full code Family Communication: None at bedside Disposition Plan:  Discharged today in stable condition  Discharge Instructions  Discharge Instructions    Ambulatory referral to Occupational Therapy   Complete by:  As directed      Allergies as of 11/28/2018      Reactions   Alendronate Sodium Other (See Comments)   Jaw pain   Amoxicillin Other (See Comments)   Nausea, dizziness Nausea, dizziness   Codeine Nausea And Vomiting   Fosamax [alendronate Sodium] Other (See Comments)   Jaw pain   Gluten Meal Other (See Comments)   Celiac Disease Celiac Disease   Hydrochlorothiazide Other (See Comments)   Hyponatremia, GI upset Hyponatremia, GI upset   Tetanus Toxoid, Adsorbed Other (See Comments)   Bad local reaction   Tetanus  Toxoids Other (See Comments)   Bad local reaction      Medication List    STOP taking these medications   Rivaroxaban 15  MG Tabs tablet Commonly known as:  XARELTO     TAKE these medications   acetaminophen 500 MG tablet Commonly known as:  TYLENOL Take 1,000 mg by mouth every 6 (six) hours as needed.   amiodarone 100 MG tablet Commonly known as:  PACERONE Take 100 mg by mouth daily.   amLODipine 2.5 MG tablet Commonly known as:  NORVASC Take 1 tablet (2.5 mg total) by mouth daily for 30 days. What changed:    when to take this  additional instructions   apixaban 2.5 MG Tabs tablet Commonly known as:  ELIQUIS Take 1 tablet (2.5 mg total) by mouth 2 (two) times daily for 30 days.   aspirin 81 MG EC tablet Take 1 tablet (81 mg total) by mouth daily for 30 days.   atorvastatin 40 MG tablet Commonly known as:  LIPITOR Take 1 tablet (40 mg total) by mouth daily at 6 PM for 30 days.   BENEFIBER Powd Take 1 Scoop by mouth daily.   furosemide 20 MG tablet Commonly known as:  LASIX Take 20 mg by mouth daily.   levothyroxine 50 MCG tablet Commonly known as:  SYNTHROID, LEVOTHROID Take 1 tablet (50 mcg total) by mouth daily before breakfast.   Magnesium 250 MG Tabs Take 250 mg by mouth daily.   Melatonin CR 3 MG Tbcr Take 3 mg by mouth at bedtime.   MULTIPLE VITAMIN PO Take 1 tablet by mouth daily.   pantoprazole 40 MG tablet Commonly known as:  PROTONIX Take 40 mg by mouth daily.   traZODone 50 MG tablet Commonly known as:  DESYREL Take 25 mg by mouth at bedtime as needed for sleep.      Follow-up Information    Pieter Partridge, DO. Call in 4 week(s).   Specialty:  Neurology Contact information: [address] [place] [id number] [phone]          Allergies  Allergen Reactions  . Alendronate Sodium Other (See Comments)    Jaw pain  . Amoxicillin Other (See Comments)    Nausea, dizziness Nausea, dizziness  . Codeine Nausea And Vomiting  . Fosamax [Alendronate Sodium] Other (See Comments)    Jaw pain  . Gluten Meal Other (See Comments)     Celiac Disease Celiac Disease  . Hydrochlorothiazide Other (See Comments)    Hyponatremia, GI upset Hyponatremia, GI upset  . Tetanus Toxoid, Adsorbed Other (See Comments)    Bad local reaction  . Tetanus Toxoids Other (See Comments)    Bad local reaction    You were cared for by a hospitalist during your hospital stay. If you have any questions about your discharge medications or the care you received while you were in the hospital after you are discharged, you can call the unit and asked to speak with the hospitalist on call if the hospitalist that took care of you is not available. Once you are discharged, your primary care physician will handle any further medical issues. Please note that no refills for any discharge medications will be authorized once you are discharged, as it is imperative that you return to your primary care physician (or establish a relationship with a primary care physician if you do not have one) for your aftercare needs so that they can reassess your need for medications and monitor  your lab values.  Consultations:  Neurology   Procedures/Studies: Ct Angio Head W Or Wo Contrast  Result Date: 11/26/2018 CLINICAL DATA:  85 year old female with headache, dizziness, vertical diplopia, chest pain, back pain, hypertensive. EXAM: CT ANGIOGRAPHY HEAD AND NECK TECHNIQUE: Multidetector CT imaging of the head and neck was performed using the standard protocol during bolus administration of intravenous contrast. Multiplanar CT image reconstructions and MIPs were obtained to evaluate the vascular anatomy. Carotid stenosis measurements (when applicable) are obtained utilizing NASCET criteria, using the distal internal carotid diameter as the denominator. CONTRAST:  59m ISOVUE-370 IOPAMIDOL (ISOVUE-370) INJECTION 76% COMPARISON:  Chest CTA today reported separately. Head face and cervical spine CTs 06/16/2016 and 03/02/2016. Brain MRI 01/16/2016. FINDINGS: CT HEAD Brain: Stable  cerebral volume with no intracranial mass effect. No acute intracranial hemorrhage identified. No ventriculomegaly. Small chronic lacune in the left thalamus is stable. Elsewhere stable and normal for age gray-white matter differentiation. No cortically based acute infarct identified. Calvarium and skull base: Osteopenia. No acute osseous abnormality identified. Paranasal sinuses: Visualized paranasal sinuses and mastoids are stable and well pneumatized. Tympanic cavities remain clear. Orbits: No acute orbit or scalp soft tissue finding. CTA NECK Skeleton: Prior sternotomy. Osteopenia. No acute osseous abnormality in the head or neck. Upper chest: Chest CTA reported separately today. Other neck: Negative; no neck mass or lymphadenopathy. Aortic arch: Calcified aortic atherosclerosis. Three vessel arch configuration. Right carotid system: Mildly tortuous brachiocephalic artery and proximal right CCA without stenosis. Minimal right CCA plaque without stenosis proximal to the bifurcation. Calcified plaque at the posterior right ICA origin and bulb without stenosis. Negative cervical right ICA distal to the bulb. Left carotid system: Normal left CCA origin. Mildly tortuous proximal left CCA. No plaque proximal to the bifurcation. Soft and calcified plaque at the left ICA origin and bulb resulting in less than 50 % stenosis with respect to the distal vessel. Otherwise minimal cervical left ICA mural irregularity without stenosis to the skull base. Vertebral arteries: Soft and calcified plaque in the proximal right subclavian artery resulting in 65% stenosis proximal to the right vertebral artery origin. Calcified plaque about the right vertebral origin but no associated proximal right vertebral stenosis. Patent right vertebral artery to the skull base with mild tortuosity, no stenosis. Mild proximal left subclavian artery plaque without stenosis. Calcified plaque near the left vertebral artery origin but no origin  stenosis. Tortuous left V1 segment. Patent left vertebral artery to the skull base without stenosis. CTA HEAD Posterior circulation: Bulky calcified plaque in the right V4 segment with moderate to severe stenosis (series 13, image 144). This is proximal to the right PICA origin which remains patent. No other right vertebral stenosis to the vertebrobasilar junction. Contralateral proximal left V4 calcified plaque with only mild stenosis. Normal left PICA origin and vertebrobasilar junction. Patent basilar artery without stenosis. Normal SCA and left PCA origins. Fetal type right PCA origin. Bilateral PCA branches are within normal limits. Anterior circulation:  Both ICA siphons are patent. On the left there is moderate calcified plaque through the cavernous segment and bulky supraclinoid calcified plaque with severe stenosis just distal to the left ophthalmic artery origin (series 13, image 105). The left carotid terminus remains patent. On the right there is mild to moderate calcified plaque without stenosis. Normal right ophthalmic and posterior communicating artery origins. Patent carotid termini. Normal MCA and ACA origins. Dominant right A1 segment. Anterior communicating artery and bilateral ACA branches are within normal limits. Left MCA M1 segment, bifurcation, and left  MCA branches are within normal limits. Right MCA M1 segment, bifurcation, and right MCA branches are within normal limits. Venous sinuses: Patent on the delayed images. Anatomic variants: Fetal type right PCA origin. Dominant right ACA A1 segment. Delayed phase: No abnormal enhancement identified. Review of the MIP images confirms the above findings IMPRESSION: 1. Negative for large vessel occlusion or arterial dissection in the head or neck. 2. Positive for Severe stenoses of the distal Left ICA Siphon and the distal Right Vertebral Artery due to bulky calcified plaque. 3. Bulky plaque also in the proximal Right Subclavian Artery with 65%  stenosis. Cervical carotid atherosclerosis without stenosis. 4. Stable CT appearance of the brain since 2017 with no acute intracranial abnormality. 5. CTA chest today reported separately. Electronically Signed   By: Genevie Ann M.D.   On: 11/26/2018 18:04   Dg Chest 2 View  Result Date: 11/26/2018 CLINICAL DATA:  Pt here from independent living. Pt has been having chest pain with left arm numbness since 11-Jan-2023. No chest pain currently but her left arm has felt "dead" since 01-11-2023. She feels decreased sensation to the left hand. Endorses shortness of breath with exertion. Mild headache to left side. EXAM: CHEST - 2 VIEW COMPARISON:  01/05/2017 FINDINGS: Stable changes from prior CABG surgery. Cardiac silhouette top-normal in size. No mediastinal or hilar masses. No evidence of adenopathy. Lungs are hyperexpanded. There are prominent bronchovascular markings. Linear scarring noted at the left lung base. No evidence of pneumonia or pulmonary edema. No pleural effusion or pneumothorax. Chronic lower thoracic spine mild compression fracture. Skeletal structures are diffusely demineralized. IMPRESSION: 1. No acute cardiopulmonary disease. 2. Stable changes from prior CABG surgery. 3. Lung hyperexpansion consistent with COPD. Electronically Signed   By: [name] M.D.   On: 11/26/2018 16:26   Ct Angio Neck W And/or Wo Contrast  Result Date: 11/26/2018 CLINICAL DATA:  85 year old female with headache, dizziness, vertical diplopia, chest pain, back pain, hypertensive. EXAM: CT ANGIOGRAPHY HEAD AND NECK TECHNIQUE: Multidetector CT imaging of the head and neck was performed using the standard protocol during bolus administration of intravenous contrast. Multiplanar CT image reconstructions and MIPs were obtained to evaluate the vascular anatomy. Carotid stenosis measurements (when applicable) are obtained utilizing NASCET criteria, using the distal internal carotid diameter as the denominator. CONTRAST:  2m  ISOVUE-370 IOPAMIDOL (ISOVUE-370) INJECTION 76% COMPARISON:  Chest CTA today reported separately. Head face and cervical spine CTs 06/16/2016 and 03/02/2016. Brain MRI 01/16/2016. FINDINGS: CT HEAD Brain: Stable cerebral volume with no intracranial mass effect. No acute intracranial hemorrhage identified. No ventriculomegaly. Small chronic lacune in the left thalamus is stable. Elsewhere stable and normal for age gray-white matter differentiation. No cortically based acute infarct identified. Calvarium and skull base: Osteopenia. No acute osseous abnormality identified. Paranasal sinuses: Visualized paranasal sinuses and mastoids are stable and well pneumatized. Tympanic cavities remain clear. Orbits: No acute orbit or scalp soft tissue finding. CTA NECK Skeleton: Prior sternotomy. Osteopenia. No acute osseous abnormality in the head or neck. Upper chest: Chest CTA reported separately today. Other neck: Negative; no neck mass or lymphadenopathy. Aortic arch: Calcified aortic atherosclerosis. Three vessel arch configuration. Right carotid system: Mildly tortuous brachiocephalic artery and proximal right CCA without stenosis. Minimal right CCA plaque without stenosis proximal to the bifurcation. Calcified plaque at the posterior right ICA origin and bulb without stenosis. Negative cervical right ICA distal to the bulb. Left carotid system: Normal left CCA origin. Mildly tortuous proximal left CCA. No plaque proximal to the bifurcation.  Soft and calcified plaque at the left ICA origin and bulb resulting in less than 50 % stenosis with respect to the distal vessel. Otherwise minimal cervical left ICA mural irregularity without stenosis to the skull base. Vertebral arteries: Soft and calcified plaque in the proximal right subclavian artery resulting in 65% stenosis proximal to the right vertebral artery origin. Calcified plaque about the right vertebral origin but no associated proximal right vertebral stenosis. Patent  right vertebral artery to the skull base with mild tortuosity, no stenosis. Mild proximal left subclavian artery plaque without stenosis. Calcified plaque near the left vertebral artery origin but no origin stenosis. Tortuous left V1 segment. Patent left vertebral artery to the skull base without stenosis. CTA HEAD Posterior circulation: Bulky calcified plaque in the right V4 segment with moderate to severe stenosis (series 13, image 144). This is proximal to the right PICA origin which remains patent. No other right vertebral stenosis to the vertebrobasilar junction. Contralateral proximal left V4 calcified plaque with only mild stenosis. Normal left PICA origin and vertebrobasilar junction. Patent basilar artery without stenosis. Normal SCA and left PCA origins. Fetal type right PCA origin. Bilateral PCA branches are within normal limits. Anterior circulation:  Both ICA siphons are patent. On the left there is moderate calcified plaque through the cavernous segment and bulky supraclinoid calcified plaque with severe stenosis just distal to the left ophthalmic artery origin (series 13, image 105). The left carotid terminus remains patent. On the right there is mild to moderate calcified plaque without stenosis. Normal right ophthalmic and posterior communicating artery origins. Patent carotid termini. Normal MCA and ACA origins. Dominant right A1 segment. Anterior communicating artery and bilateral ACA branches are within normal limits. Left MCA M1 segment, bifurcation, and left MCA branches are within normal limits. Right MCA M1 segment, bifurcation, and right MCA branches are within normal limits. Venous sinuses: Patent on the delayed images. Anatomic variants: Fetal type right PCA origin. Dominant right ACA A1 segment. Delayed phase: No abnormal enhancement identified. Review of the MIP images confirms the above findings IMPRESSION: 1. Negative for large vessel occlusion or arterial dissection in the head or  neck. 2. Positive for Severe stenoses of the distal Left ICA Siphon and the distal Right Vertebral Artery due to bulky calcified plaque. 3. Bulky plaque also in the proximal Right Subclavian Artery with 65% stenosis. Cervical carotid atherosclerosis without stenosis. 4. Stable CT appearance of the brain since 2017 with no acute intracranial abnormality. 5. CTA chest today reported separately. Electronically Signed   By: Genevie Ann M.D.   On: 11/26/2018 18:04   Mr Brain Wo Contrast  Result Date: 11/27/2018 CLINICAL DATA:  Headache for 3 days. Transient diplopia and left arm weakness. Left arm numbness in the hand. EXAM: MRI HEAD WITHOUT CONTRAST TECHNIQUE: Multiplanar, multiecho pulse sequences of the brain and surrounding structures were obtained without intravenous contrast. COMPARISON:  Head CT 11/26/2018 and MRI 01/16/2016 FINDINGS: Brain: There is no evidence of acute infarct, intracranial hemorrhage, mass, midline shift, or extra-axial fluid collection. Mild cerebral atrophy is within normal limits for age. Chronic lacunar infarcts are again seen in the left thalamus and cerebellum. Patchy T2 hyperintensities in the cerebral white matter bilaterally are similar to the prior MRI and nonspecific but compatible with mild-to-moderate chronic small vessel ischemic disease. Vascular: Major intracranial vascular flow voids are preserved. Skull and upper cervical spine: Unremarkable bone marrow signal. Sinuses/Orbits: Bilateral cataract extraction. Mild left maxillary sinus mucosal thickening. Small mastoid effusions. Other: None. IMPRESSION: 1. No  acute intracranial abnormality. 2. Mild-to-moderate chronic small vessel ischemic disease. Electronically Signed   By: Logan Bores M.D.   On: 11/27/2018 07:05   Ct Angio Chest Aorta W And/or Wo Contrast  Result Date: 11/26/2018 CLINICAL DATA:  85 year old female with acute chest pain EXAM: CT ANGIOGRAPHY CHEST WITH CONTRAST TECHNIQUE: Multidetector CT imaging of the  chest was performed using the standard protocol during bolus administration of intravenous contrast. Multiplanar CT image reconstructions and MIPs were obtained to evaluate the vascular anatomy. CONTRAST:  81m ISOVUE-370 IOPAMIDOL (ISOVUE-370) INJECTION 76% COMPARISON:  11/26/2018 chest radiograph and prior studies FINDINGS: Cardiovascular: Cardiomegaly, CABG changes and LEFT atrial appendage clip again noted. Coronary artery and aortic atherosclerotic calcifications are identified without evidence of thoracic aortic aneurysm or dissection. No pulmonary emboli or pericardial effusion identified. Mediastinum/Nodes: No enlarged mediastinal, hilar, or axillary lymph nodes. Thyroid gland, trachea, and esophagus demonstrate no significant findings. Lungs/Pleura: Mild bilateral ground-glass opacities in a mosaic type pattern probably represents small airway disease or possibly small vessel disease. No airspace disease, consolidation, mass, nodule, pleural effusion or pneumothorax noted. Mild bibasilar atelectasis/scarring noted. Upper Abdomen: No acute abnormality. Musculoskeletal: No acute or suspicious bony abnormalities. T10 compression fracture is unchanged. Review of the MIP images confirms the above findings. IMPRESSION: 1. No evidence of thoracic aortic aneurysm/dissection or pulmonary emboli. 2. Cardiomegaly and CABG changes. 3. Mild bilateral ground-glass opacities in a mosaic type pattern probably representing small airway disease. Mild bibasilar atelectasis/scarring 4.  Aortic Atherosclerosis (ICD10-I70.0). Electronically Signed   By: JMargarette CanadaM.D.   On: 11/26/2018 18:00      Subjective: Feels much better, no complaints.  General = no fevers, chills, dizziness, malaise, fatigue HEENT/EYES = negative for pain, redness, loss of vision, double vision, blurred vision, loss of hearing, sore throat, hoarseness, dysphagia Cardiovascular= negative for chest pain, palpitation, murmurs, lower extremity  swelling Respiratory/lungs= negative for shortness of breath, cough, hemoptysis, wheezing, mucus production Gastrointestinal= negative for nausea, vomiting,, abdominal pain, melena, hematemesis Genitourinary= negative for Dysuria, Hematuria, Change in Urinary Frequency MSK = Negative for arthralgia, myalgias, Back Pain, Joint swelling  Neurology= Negative for headache, seizures, numbness, tingling  Psychiatry= Negative for anxiety, depression, suicidal and homocidal ideation Allergy/Immunology= Medication/Food allergy as listed  Skin= Negative for Rash, lesions, ulcers, itching    Discharge Exam: Vitals:   11/28/18 0353 11/28/18 0736  BP: (!) 129/99 (!) 149/57  Pulse: 60 (!) 56  Resp: 19 16  Temp: (!) 97.4 F (36.3 C) (!) 97.5 F (36.4 C)  SpO2: 99% 97%   Vitals:   11/28/18 0100 11/28/18 0352 11/28/18 0353 11/28/18 0736  BP: (!) 143/62 (!) 148/70 (!) 129/99 (!) 149/57  Pulse:   60 (!) 56  Resp: 20 18 19 16   Temp:   (!) 97.4 F (36.3 C) (!) 97.5 F (36.4 C)  TempSrc:   Oral Oral  SpO2:   99% 97%  Weight:      Height:        General: Pt is alert, awake, not in acute distress Cardiovascular: RRR, S1/S2 +, no rubs, no gallops Respiratory: CTA bilaterally, no wheezing, no rhonchi Abdominal: Soft, NT, ND, bowel sounds + Extremities: no edema, no cyanosis    The results of significant diagnostics from this hospitalization (including imaging, microbiology, ancillary and laboratory) are listed below for reference.     Microbiology: Recent Results (from the past 240 hour(s))  Urine culture     Status: None   Collection Time: 11/26/18  4:34 PM  Result Value  Ref Range Status   Specimen Description URINE, CLEAN CATCH  Final   Special Requests   Final    NONE Performed at [facility], [place] [address] [place], [place] [address]    Culture NO GROWTH  Final   Report Status 11/27/2018 FINAL  Final     Labs: BNP (last 3 results) Recent Labs    11/26/18 1521  BNP  160.7*   Basic Metabolic Panel: Recent Labs  Lab 11/26/18 1521  NA 136  K 4.2  CL 104  CO2 22  GLUCOSE 91  BUN 10  CREATININE 0.86  CALCIUM 9.1  MG 2.2   Liver Function Tests: Recent Labs  Lab 11/26/18 1521  AST 32  ALT 18  ALKPHOS 65  BILITOT 0.4  PROT 7.3  ALBUMIN 3.5   Recent Labs  Lab 11/26/18 1521  LIPASE 31   No results for input(s): AMMONIA in the last 168 hours. CBC: Recent Labs  Lab 11/26/18 1521  WBC 5.8  HGB 12.5  HCT 38.6  MCV 90.4  PLT 262   Cardiac Enzymes: Recent Labs  Lab 11/26/18 2336 11/27/18 0637 11/27/18 1031  TROPONINI <0.03 <0.03 <0.03   BNP: Invalid input(s): POCBNP CBG: No results for input(s): GLUCAP in the last 168 hours. D-Dimer No results for input(s): DDIMER in the last 72 hours. Hgb A1c Recent Labs    11/27/18 0637  HGBA1C 5.4   Lipid Profile Recent Labs    11/27/18 0637  CHOL 223*  HDL 74  LDLCALC 130*  TRIG 93  CHOLHDL 3.0   Thyroid function studies No results for input(s): TSH, T4TOTAL, T3FREE, THYROIDAB in the last 72 hours.  Invalid input(s): FREET3 Anemia work up No results for input(s): VITAMINB12, FOLATE, FERRITIN, TIBC, IRON, RETICCTPCT in the last 72 hours. Urinalysis    Component Value Date/Time   COLORURINE YELLOW 11/26/2018 [place] 11/26/2018 1634   LABSPEC 1.005 11/26/2018 1634   PHURINE 7.0 11/26/2018 1634   GLUCOSEU NEGATIVE 11/26/2018 1634   HGBUR NEGATIVE 11/26/2018 1634   BILIRUBINUR NEGATIVE 11/26/2018 1634   KETONESUR NEGATIVE 11/26/2018 1634   PROTEINUR NEGATIVE 11/26/2018 1634   UROBILINOGEN 0.2 06/09/2015 2120   NITRITE NEGATIVE 11/26/2018 1634   LEUKOCYTESUR NEGATIVE 11/26/2018 1634   Sepsis Labs Invalid input(s): PROCALCITONIN,  WBC,  LACTICIDVEN Microbiology Recent Results (from the past 240 hour(s))  Urine culture     Status: None   Collection Time: 11/26/18  4:34 PM  Result Value Ref Range Status   Specimen Description URINE, CLEAN CATCH   Final   Special Requests   Final    NONE Performed at [facility], [place] [address] [place], [place] [address]    Culture NO GROWTH  Final   Report Status 11/27/2018 FINAL  Final     Time coordinating discharge:  I have spent 35 minutes face to face with the patient and on the ward discussing the patients care, assessment, plan and disposition with other care givers. >50% of the time was devoted counseling the patient about the risks and benefits of treatment/Discharge disposition and coordinating care.   SIGNED:   Damita Lack, MD  [facility] 11/28/2018, 11:07 AM   If 7PM-7AM, please contact night-coverage [email or web address]

## 2018-11-29 ENCOUNTER — Observation Stay (HOSPITAL_COMMUNITY)
Admission: EM | Admit: 2018-11-29 | Discharge: 2018-11-30 | Disposition: A | Discharge status: Home / Self Care | Payer: Medicare Other | Attending: Internal Medicine | Admitting: Internal Medicine

## 2018-11-29 ENCOUNTER — Other Ambulatory Visit: Payer: Self-pay

## 2018-11-29 ENCOUNTER — Encounter (HOSPITAL_COMMUNITY): Payer: Self-pay | Admitting: Emergency Medicine

## 2018-11-29 ENCOUNTER — Emergency Department (HOSPITAL_COMMUNITY): Payer: Medicare Other

## 2018-11-29 DIAGNOSIS — Z951 Presence of aortocoronary bypass graft: Secondary | ICD-10-CM | POA: Insufficient documentation

## 2018-11-29 DIAGNOSIS — Z7982 Long term (current) use of aspirin: Secondary | ICD-10-CM | POA: Insufficient documentation

## 2018-11-29 DIAGNOSIS — I482 Chronic atrial fibrillation, unspecified: Secondary | ICD-10-CM | POA: Diagnosis not present

## 2018-11-29 DIAGNOSIS — Z880 Allergy status to penicillin: Secondary | ICD-10-CM | POA: Diagnosis not present

## 2018-11-29 DIAGNOSIS — I25709 Atherosclerosis of coronary artery bypass graft(s), unspecified, with unspecified angina pectoris: Secondary | ICD-10-CM | POA: Diagnosis present

## 2018-11-29 DIAGNOSIS — Z96641 Presence of right artificial hip joint: Secondary | ICD-10-CM | POA: Insufficient documentation

## 2018-11-29 DIAGNOSIS — E860 Dehydration: Secondary | ICD-10-CM | POA: Diagnosis not present

## 2018-11-29 DIAGNOSIS — I495 Sick sinus syndrome: Secondary | ICD-10-CM | POA: Diagnosis not present

## 2018-11-29 DIAGNOSIS — G4733 Obstructive sleep apnea (adult) (pediatric): Secondary | ICD-10-CM | POA: Insufficient documentation

## 2018-11-29 DIAGNOSIS — Z885 Allergy status to narcotic agent status: Secondary | ICD-10-CM | POA: Insufficient documentation

## 2018-11-29 DIAGNOSIS — Z888 Allergy status to other drugs, medicaments and biological substances status: Secondary | ICD-10-CM | POA: Insufficient documentation

## 2018-11-29 DIAGNOSIS — Z79890 Hormone replacement therapy (postmenopausal): Secondary | ICD-10-CM | POA: Insufficient documentation

## 2018-11-29 DIAGNOSIS — K900 Celiac disease: Secondary | ICD-10-CM | POA: Insufficient documentation

## 2018-11-29 DIAGNOSIS — R200 Anesthesia of skin: Secondary | ICD-10-CM | POA: Diagnosis not present

## 2018-11-29 DIAGNOSIS — N183 Chronic kidney disease, stage 3 unspecified: Secondary | ICD-10-CM | POA: Diagnosis present

## 2018-11-29 DIAGNOSIS — Z86718 Personal history of other venous thrombosis and embolism: Secondary | ICD-10-CM | POA: Insufficient documentation

## 2018-11-29 DIAGNOSIS — E039 Hypothyroidism, unspecified: Secondary | ICD-10-CM | POA: Diagnosis not present

## 2018-11-29 DIAGNOSIS — I10 Essential (primary) hypertension: Secondary | ICD-10-CM

## 2018-11-29 DIAGNOSIS — R29898 Other symptoms and signs involving the musculoskeletal system: Secondary | ICD-10-CM | POA: Diagnosis present

## 2018-11-29 DIAGNOSIS — R531 Weakness: Secondary | ICD-10-CM | POA: Diagnosis not present

## 2018-11-29 DIAGNOSIS — I480 Paroxysmal atrial fibrillation: Secondary | ICD-10-CM | POA: Diagnosis not present

## 2018-11-29 DIAGNOSIS — Z7901 Long term (current) use of anticoagulants: Secondary | ICD-10-CM | POA: Insufficient documentation

## 2018-11-29 DIAGNOSIS — I5032 Chronic diastolic (congestive) heart failure: Secondary | ICD-10-CM | POA: Diagnosis not present

## 2018-11-29 DIAGNOSIS — K219 Gastro-esophageal reflux disease without esophagitis: Secondary | ICD-10-CM | POA: Diagnosis not present

## 2018-11-29 DIAGNOSIS — F329 Major depressive disorder, single episode, unspecified: Secondary | ICD-10-CM | POA: Diagnosis not present

## 2018-11-29 DIAGNOSIS — I272 Pulmonary hypertension, unspecified: Secondary | ICD-10-CM | POA: Diagnosis not present

## 2018-11-29 DIAGNOSIS — E878 Other disorders of electrolyte and fluid balance, not elsewhere classified: Secondary | ICD-10-CM | POA: Diagnosis not present

## 2018-11-29 DIAGNOSIS — I130 Hypertensive heart and chronic kidney disease with heart failure and stage 1 through stage 4 chronic kidney disease, or unspecified chronic kidney disease: Secondary | ICD-10-CM | POA: Diagnosis not present

## 2018-11-29 DIAGNOSIS — Z8249 Family history of ischemic heart disease and other diseases of the circulatory system: Secondary | ICD-10-CM | POA: Diagnosis not present

## 2018-11-29 DIAGNOSIS — Z79899 Other long term (current) drug therapy: Secondary | ICD-10-CM | POA: Insufficient documentation

## 2018-11-29 DIAGNOSIS — E871 Hypo-osmolality and hyponatremia: Secondary | ICD-10-CM | POA: Diagnosis not present

## 2018-11-29 DIAGNOSIS — G2581 Restless legs syndrome: Secondary | ICD-10-CM | POA: Insufficient documentation

## 2018-11-29 DIAGNOSIS — R51 Headache: Secondary | ICD-10-CM | POA: Diagnosis not present

## 2018-11-29 DIAGNOSIS — R29818 Other symptoms and signs involving the nervous system: Secondary | ICD-10-CM | POA: Diagnosis not present

## 2018-11-29 LAB — DIFFERENTIAL
Abs Immature Granulocytes: 0 10*3/uL (ref 0.00–0.07)
Basophils Absolute: 0 10*3/uL (ref 0.0–0.1)
Basophils Relative: 0 %
Eosinophils Absolute: 0.1 10*3/uL (ref 0.0–0.5)
Eosinophils Relative: 1 %
Lymphocytes Relative: 18 %
Lymphs Abs: 1.9 10*3/uL (ref 0.7–4.0)
Monocytes Absolute: 0.4 10*3/uL (ref 0.1–1.0)
Monocytes Relative: 4 %
NRBC: 0 /100{WBCs}
Neutro Abs: 7.9 10*3/uL — ABNORMAL HIGH (ref 1.7–7.7)
Neutrophils Relative %: 77 %

## 2018-11-29 LAB — COMPREHENSIVE METABOLIC PANEL
ALT: 19 U/L (ref 0–44)
AST: 34 U/L (ref 15–41)
Albumin: 3.9 g/dL (ref 3.5–5.0)
Alkaline Phosphatase: 68 U/L (ref 38–126)
Anion gap: 13 (ref 5–15)
BUN: 14 mg/dL (ref 8–23)
CHLORIDE: 95 mmol/L — AB (ref 98–111)
CO2: 21 mmol/L — ABNORMAL LOW (ref 22–32)
Calcium: 9.1 mg/dL (ref 8.9–10.3)
Creatinine, Ser: 0.85 mg/dL (ref 0.44–1.00)
GFR calc Af Amer: >60 mL/min (ref >60)
GFR calc non Af Amer: 59 mL/min — ABNORMAL LOW (ref >60)
Glucose, Bld: 106 mg/dL — ABNORMAL HIGH (ref 70–99)
Potassium: 3.6 mmol/L (ref 3.5–5.1)
Sodium: 129 mmol/L — ABNORMAL LOW (ref 135–145)
Total Bilirubin: 0.9 mg/dL (ref 0.3–1.2)
Total Protein: 7.8 g/dL (ref 6.5–8.1)

## 2018-11-29 LAB — CBC
HCT: 41.2 % (ref 36.0–46.0)
HEMOGLOBIN: 13.4 g/dL (ref 12.0–15.0)
MCH: 29.4 pg (ref 26.0–34.0)
MCHC: 32.5 g/dL (ref 30.0–36.0)
MCV: 90.4 fL (ref 80.0–100.0)
Platelets: 262 10*3/uL (ref 150–400)
RBC: 4.56 MIL/uL (ref 3.87–5.11)
RDW: 22.5 % — ABNORMAL HIGH (ref 11.5–15.5)
WBC: 10.3 10*3/uL (ref 4.0–10.5)
nRBC: 0 % (ref 0.0–0.2)

## 2018-11-29 LAB — PROTIME-INR
INR: 1.19
Prothrombin Time: 15 seconds (ref 11.4–15.2)

## 2018-11-29 LAB — CBG MONITORING, ED: Glucose-Capillary: 100 mg/dL — ABNORMAL HIGH (ref 70–99)

## 2018-11-29 LAB — APTT: aPTT: 36 seconds (ref 24–36)

## 2018-11-29 LAB — I-STAT TROPONIN, ED: Troponin i, poc: 0 ng/mL (ref 0.00–0.08)

## 2018-11-29 MED ORDER — MAGNESIUM OXIDE 400 (241.3 MG) MG PO TABS
200.0000 mg | ORAL_TABLET | Freq: Every day | ORAL | Status: DC
  Administered 2018-11-30: 200 mg via ORAL
  Filled 2018-11-29: qty 1

## 2018-11-29 MED ORDER — SODIUM CHLORIDE 0.9% FLUSH
3.0000 mL | Freq: Once | INTRAVENOUS | Status: AC
  Administered 2018-11-29: 3 mL via INTRAVENOUS

## 2018-11-29 MED ORDER — MELATONIN 3 MG PO TABS
3.0000 mg | ORAL_TABLET | Freq: Every day | ORAL | Status: DC
  Administered 2018-11-29: 3 mg via ORAL
  Filled 2018-11-29: qty 1

## 2018-11-29 MED ORDER — LEVOTHYROXINE SODIUM 50 MCG PO TABS
50.0000 ug | ORAL_TABLET | Freq: Every day | ORAL | Status: DC
  Administered 2018-11-30: 50 ug via ORAL
  Filled 2018-11-29: qty 1

## 2018-11-29 MED ORDER — BENEFIBER PO POWD: 1.0000 | Freq: Every day | ORAL | Status: DC

## 2018-11-29 MED ORDER — AMIODARONE HCL 100 MG PO TABS
100.0000 mg | ORAL_TABLET | Freq: Every day | ORAL | Status: DC
  Administered 2018-11-29: 100 mg via ORAL
  Filled 2018-11-29 (×2): qty 1

## 2018-11-29 MED ORDER — APIXABAN 2.5 MG PO TABS
2.5000 mg | ORAL_TABLET | Freq: Two times a day (BID) | ORAL | Status: DC
  Administered 2018-11-29 – 2018-11-30 (×3): 2.5 mg via ORAL
  Filled 2018-11-29 (×3): qty 1

## 2018-11-29 MED ORDER — ACETAMINOPHEN 500 MG PO TABS: 1000.0000 mg | ORAL_TABLET | Freq: Four times a day (QID) | ORAL | Status: DC | PRN

## 2018-11-29 MED ORDER — ATORVASTATIN CALCIUM 40 MG PO TABS
40.0000 mg | ORAL_TABLET | Freq: Every day | ORAL | Status: DC
  Administered 2018-11-29: 40 mg via ORAL
  Filled 2018-11-29: qty 1

## 2018-11-29 MED ORDER — SODIUM CHLORIDE 0.9 % IV SOLN
INTRAVENOUS | Status: DC
  Administered 2018-11-29 – 2018-11-30 (×3): via INTRAVENOUS

## 2018-11-29 MED ORDER — MAGNESIUM 250 MG PO TABS: 250.0000 mg | ORAL_TABLET | Freq: Every day | ORAL | Status: DC

## 2018-11-29 MED ORDER — AMLODIPINE BESYLATE 2.5 MG PO TABS
2.5000 mg | ORAL_TABLET | Freq: Every day | ORAL | Status: DC
  Administered 2018-11-29 – 2018-11-30 (×2): 2.5 mg via ORAL
  Filled 2018-11-29 (×2): qty 1

## 2018-11-29 MED ORDER — ASPIRIN EC 81 MG PO TBEC
81.0000 mg | DELAYED_RELEASE_TABLET | Freq: Every day | ORAL | Status: DC
  Administered 2018-11-29 – 2018-11-30 (×2): 81 mg via ORAL
  Filled 2018-11-29 (×2): qty 1

## 2018-11-29 MED ORDER — TRAZODONE HCL 50 MG PO TABS: 25.0000 mg | ORAL_TABLET | Freq: Every evening | ORAL | Status: DC | PRN

## 2018-11-29 MED ORDER — PANTOPRAZOLE SODIUM 40 MG PO TBEC
40.0000 mg | DELAYED_RELEASE_TABLET | Freq: Every day | ORAL | Status: DC
  Administered 2018-11-29 – 2018-11-30 (×2): 40 mg via ORAL
  Filled 2018-11-29 (×2): qty 1

## 2018-11-29 NOTE — ED Notes (Signed)
Unsuccessful blood draw. RN Almyra Free states they will draw blood when they get a IV

## 2018-11-29 NOTE — ED Notes (Signed)
Ambulated to bathroom with very little assistance.

## 2018-11-29 NOTE — Consult Note (Signed)
Neurology Consultation Reason for Consult: Left Sided weakness Referring Physician: Christy Gentles, D  CC: Left sided weakness  History is obtained from:patient  HPI: Maria [name] is a 85 y.o. female with a history o fafib on eliquis who was just discharged following TIA on 02/04. During that admission, an LDL was found to be 130 and she was increased to 40 mg of atorvastatin.  She was also found to have severe stenosis of both her right vertebral as well as her left carotid artery.  Since that time, she reports that she has had some mild left-sided heaviness that may have been absent at some point during her admission, but has been more present since leaving.  She has noticed since 9 PM in particular worsening of her left-sided heaviness.  For this reason when she got up around 1:30 AM, and noticed that it was continuing to get worse she decided to seek care in the emergency department and dialed 911 as she had been instructed to do during her hospitalization.   LKW: Last Thursday, worsening started around 9 pm.  tpa given?: no, out of window.     ROS: A 14 point ROS was performed and is negative except as noted in the HPI.  Past Medical History:  Diagnosis Date  . Anemia   . Atonic bladder 12/11/2013  . Atrial fibrillation ([place])   . Carotid artery occlusion    right carotid stenosis 40-60%, 4/08, neg for stenosis repeat study 11/11  . Celiac disease   . Chronic anticoagulation CHADS2VASC2 score 7 10/18/2014  . Chronic diastolic CHF (congestive heart failure) (H[facility])    takes Lasix daily  . CKD (chronic kidney disease), stage III ([place])   . Coronary artery disease    a.  08/2013 (LIMA-LAD, rSVG to LCX and Lt atrial clip),  . Depression   . DVT (deep venous thrombosis) ([place]) 09/2013  . Gastric ulcer    6-64month ago  . GERD (gastroesophageal reflux disease)    takes Omeprazole daily  . GI bleeding   . Hemorrhoids   . Hot flashes    takes ZOloft 3 times a week  . Hypertension   .  Hypothyroidism    takes Synthroid daily as result of AMiodarone  . Insomnia   . Macular degeneration   . Mild aortic insufficiency    a. Echo 10/2013: mild AI.  .Marland KitchenModerate mitral regurgitation by prior echocardiogram    a. Echo 10/2013.  . Moderate obstructive sleep apnea    uses CPAP;sleep study about 4-529monthago  . MVP (mitral valve prolapse)   . Osteoporosis   . PAF (paroxysmal atrial fibrillation) ([facility]  . Peripheral edema    left  . Presbycusis   . Pulmonary hypertension ([facility]  . RLS (restless legs syndrome)   . Thyroid nodule    27m70mno change 11/11  . Urethral stricture   . Urinary anastomotic stricture   . Urinary retention   . Vitamin D deficiency      Family History  Problem Relation Age of Onset  . Heart attack Mother   . CVA Mother   . CAD Father   . Colon cancer Father   . Heart attack Father   . Heart attack Brother   . Peripheral vascular disease Brother   . AAA (abdominal aortic aneurysm) Brother      Social History:  reports that she has never smoked. She has never used smokeless tobacco. She reports that she does not drink alcohol or  use drugs.   Exam: Current vital signs: BP (!) 205/81 (BP Location: Right Arm)   Pulse 76   Resp 14   SpO2 100%  Vital signs in last 24 hours: Temp:  [97.4 F (36.3 C)-97.5 F (36.4 C)] 97.5 F (36.4 C) (02/04 0736) Pulse Rate:  [56-76] 76 (02/05 0301) Resp:  [14-19] 14 (02/05 0301) BP: (129-205)/(57-99) 205/81 (02/05 0301) SpO2:  [97 %-100 %] 100 % (02/05 0301)   Physical Exam  Constitutional: Appears well-developed and well-nourished.  Psych: Affect appropriate to situation Eyes: No scleral injection HENT: No OP obstrucion Head: Normocephalic.  Cardiovascular: Normal rate and regular rhythm.  Respiratory: Effort normal, non-labored breathing GI: Soft.  No distension. There is no tenderness.  Skin: WDI  Neuro: Mental Status: Patient is awake, alert, oriented to person, place, month, year, and  situation. Patient is able to give a clear and coherent history. No signs of aphasia or neglect Cranial Nerves: II: Visual Fields are full. Pupils are equal, round, and reactive to light.   III,IV, VI: EOMI without ptosis or diploplia.  V: Facial sensation is symmetric to temperature VII: Facial movement is symmetric.  VIII: hearing is intact to voice X: Uvula elevates symmetrically XI: Shoulder shrug is symmetric. XII: tongue is midline without atrophy or fasciculations.  Motor: Tone is normal. Bulk is normal. 5/5 strength was present in all four extremities.  Sensory: Sensation is symmetric to pinprick, but questionably lightened to light touch cerebellar: FNF intact bilaterally   I have reviewed labs in epic and the results pertinent to this consultation are: BMP-unremarkable  I have reviewed the images obtained: MRI brain from a few days ago no acute stroke  Impression: 85 year old female with persistent left-sided heaviness/numbness.  I do wonder if this is a stuttering small vessel stroke, and given her worsening I do think I would favor observing her overnight.  I doubt that she would be a candidate for any type of intervention on her vertebral stenosis, but would favor continuing medical management.  It would be helpful to obtain diagnosis and therefore I do think a repeat MRI of her brain would be prudent, and since she has not had involvement of her face, I would favor getting an MRI of her C-spine as well.  Recommendations: 1) MRI brain and cervical spine 2) repeat evaluation by physical therapy 3) continue atorvastatin, Eliquis   Roland Rack, MD [facility] [phone]  If 7pm- 7am, please page neurology on call as listed in [place].

## 2018-11-29 NOTE — ED Notes (Signed)
Pt states her hearing aids are at home

## 2018-11-29 NOTE — H&P (Signed)
History and Physical    ROBERTHA [name] QJJ:[id number] DOB: [birth year] DOA: 11/29/2018  PCP: [name], MD  Patient coming from: Home.  I have personally briefly reviewed patient's old medical records available.   Chief Complaint: Left hand heaviness.  HPI: Maria [name] is a 85 y.o. female with medical history significant of chronic atrial fibrillation, coronary artery disease, stage III chronic kidney disease due to hypertension, chronic diastolic heart failure, history of DVT currently anticoagulated and hypertension who presented to the emergency room last night with recurrent episodes of left hand heaviness after discharge from the hospital earlier in the day.Patient was admitted the day before to the hospital with left-sided weakness and diplopia, however no neurological deficit on examination.  She underwent stroke work-up including MRI that showed no infarction and CTA was positive for left ICA stenosis.  She did have posterior circulation disease.  She was already on Xarelto, it was changed to Eliquis, aspirin was added and Lipitor dose was increased and patient was discharged home.  According to the patient, she went home, went to bed.  She was asked to watch for 5 symptoms including weakness of the arm.  Early morning hours she felt like her left hand is feeling heavy so she called EMS.  Patient was brought to the ER by EMS, she had no neurological deficit.  Code stroke was called and later on canceled.  Seen by neurology.  On my evaluation in the morning, patient denies any symptoms.  She thinks she is dehydrated. ED Course: Hypertensive on arrival.  NIHSS 0.  Sodium is 129.  It was 136 yesterday.  Electrolytes are normal otherwise.  Patient underwent repeat CTAs with no new findings.  Seen by neurology.  Repeat MRI of the brain and MR of the cervical spine was done.  MRI of the brain did not show any new findings.  MR of the cervical spine showed chronic osteoarthritic changes, no  cord compression.  Due to recurrent symptoms, was advised to monitor in the hospital.  Review of Systems: As per HPI otherwise 10 point review of systems negative.    Past Medical History:  Diagnosis Date  . Anemia   . Atonic bladder 12/11/2013  . Atrial fibrillation ([place])   . Carotid artery occlusion    right carotid stenosis 40-60%, 4/08, neg for stenosis repeat study 11/11  . Celiac disease   . Chronic anticoagulation CHADS2VASC2 score 7 10/18/2014  . Chronic diastolic CHF (congestive heart failure) (HCC)    takes Lasix daily  . CKD (chronic kidney disease), stage III ([place])   . Coronary artery disease    a.  08/2013 (LIMA-LAD, rSVG to LCX and Lt atrial clip),  . Depression   . DVT (deep venous thrombosis) ([place]) 09/2013  . Gastric ulcer    6-84month ago  . GERD (gastroesophageal reflux disease)    takes Omeprazole daily  . GI bleeding   . Hemorrhoids   . Hot flashes    takes ZOloft 3 times a week  . Hypertension   . Hypothyroidism    takes Synthroid daily as result of AMiodarone  . Insomnia   . Macular degeneration   . Mild aortic insufficiency    a. Echo 10/2013: mild AI.  .Marland KitchenModerate mitral regurgitation by prior echocardiogram    a. Echo 10/2013.  . Moderate obstructive sleep apnea    uses CPAP;sleep study about 4-525monthago  . MVP (mitral valve prolapse)   . Osteoporosis   . PAF (paroxysmal atrial  fibrillation) ([place])   . Peripheral edema    left  . Presbycusis   . Pulmonary hypertension ([place])   . RLS (restless legs syndrome)   . Thyroid nodule    20m, no change 11/11  . Urethral stricture   . Urinary anastomotic stricture   . Urinary retention   . Vitamin D deficiency     Past Surgical History:  Procedure Laterality Date  . APPENDECTOMY    . bilateral cataract surgery    . CARDIAC CATHETERIZATION  08-06-13  . CORONARY ARTERY BYPASS GRAFT N/A 08/28/2013   Procedure: CORONARY ARTERY BYPASS GRAFTING (CABG) x2 using right greater saphenous vein and left  internal mammary artery. ;  Surgeon: EGrace Isaac MD;  [facility]  Service: Open Heart Surgery;  Laterality: N/A;  . HIP ARTHROPLASTY Right 12/09/2013   Procedure: ARTHROPLASTY BIPOLAR HIP CEMENTED;  Surgeon: FKerin Salen MD;  [facility]  Service: Orthopedics;  Laterality: Right;  . INTRAOPERATIVE TRANSESOPHAGEAL ECHOCARDIOGRAM N/A 08/28/2013   Procedure: INTRAOPERATIVE TRANSESOPHAGEAL ECHOCARDIOGRAM;  Surgeon: EGrace Isaac MD;  [facility]  Service: Open Heart Surgery;  Laterality: N/A;  . LEFT AND RIGHT HEART CATHETERIZATION WITH CORONARY ANGIOGRAM N/A 08/16/2013   Procedure: LEFT AND RIGHT HEART CATHETERIZATION WITH CORONARY ANGIOGRAM;  Surgeon: HSinclair Grooms MD;  [facility];  Service: Cardiovascular;  Laterality: N/A;  . MANDIBLE SURGERY    . TCS    . TONSILLECTOMY    . trigger thumb    . TUBAL LIGATION       reports that she has never smoked. She has never used smokeless tobacco. She reports that she does not drink alcohol or use drugs.  Allergies  Allergen Reactions  . Alendronate Sodium Other (See Comments)    Jaw pain  . Amoxicillin Other (See Comments)    Nausea, dizziness Nausea, dizziness  . Codeine Nausea And Vomiting  . Fosamax [Alendronate Sodium] Other (See Comments)    Jaw pain  . Gluten Meal Other (See Comments)    Celiac Disease Celiac Disease  . Hydrochlorothiazide Other (See Comments)    Hyponatremia, GI upset Hyponatremia, GI upset  . Tetanus Toxoid, Adsorbed Other (See Comments)    Bad local reaction  . Tetanus Toxoids Other (See Comments)    Bad local reaction    Family History  Problem Relation Age of Onset  . Heart attack Mother   . CVA Mother   . CAD Father   . Colon cancer Father   . Heart attack Father   . Heart attack Brother   . Peripheral vascular disease Brother   . AAA (abdominal aortic aneurysm) Brother      Prior to Admission medications   Medication Sig Start Date End Date Taking?  Authorizing Provider  apixaban (ELIQUIS) 2.5 MG TABS tablet Take 1 tablet (2.5 mg total) by mouth 2 (two) times daily for 30 days. 11/28/18 12/28/18 Yes Amin, AJeanella Flattery MD  Magnesium 250 MG TABS Take 250 mg by mouth daily.   Yes [provider]  acetaminophen (TYLENOL) 500 MG tablet Take 1,000 mg by mouth every 6 (six) hours as needed.    [provider]  amiodarone (PACERONE) 100 MG tablet Take 100 mg by mouth daily. 08/14/15   [provider]  amLODipine (NORVASC) 2.5 MG tablet Take 1 tablet (2.5 mg total) by mouth daily for 30 days. 11/28/18 12/28/18  ADamita Lack MD  aspirin EC 81 MG EC tablet Take 1 tablet (81  mg total) by mouth daily for 30 days. 11/28/18 12/28/18  Amin, Jeanella Flattery, MD  atorvastatin (LIPITOR) 40 MG tablet Take 1 tablet (40 mg total) by mouth daily at 6 PM for 30 days. 11/28/18 12/28/18  Amin, Jeanella Flattery, MD  furosemide (LASIX) 20 MG tablet Take 20 mg by mouth daily.    [provider]  levothyroxine (SYNTHROID, LEVOTHROID) 50 MCG tablet Take 1 tablet (50 mcg total) by mouth daily before breakfast. 01/03/14   Angiulli, Lavon Paganini, PA-C  Melatonin CR 3 MG TBCR Take 3 mg by mouth at bedtime.    [provider]  MULTIPLE VITAMIN PO Take 1 tablet by mouth daily.    [provider]  pantoprazole (PROTONIX) 40 MG tablet Take 40 mg by mouth daily. 09/13/18   [provider]  traZODone (DESYREL) 50 MG tablet Take 25 mg by mouth at bedtime as needed for sleep.    [provider]  Wheat Dextrin (BENEFIBER) POWD Take 1 Scoop by mouth daily.    [provider]    Physical Exam: Vitals:   11/29/18 0301 11/29/18 0354 11/29/18 0400 11/29/18 0530  BP: (!) 205/81 (!) 166/61 (!) 133/56 (!) 151/57  Pulse: 76 69 69 69  Resp: 14 16 (!) 21 18  SpO2: 100% 95% 94% 93%    Constitutional: NAD, calm, comfortable Vitals:   11/29/18 0301 11/29/18 0354 11/29/18 0400 11/29/18 0530  BP: (!) 205/81 (!) 166/61 (!) 133/56  (!) 151/57  Pulse: 76 69 69 69  Resp: 14 16 (!) 21 18  SpO2: 100% 95% 94% 93%   Eyes: PERRL, lids and conjunctivae normal ENMT: Mucous membranes are moist. Posterior pharynx clear of any exudate or lesions.Normal dentition.  Neck: normal, supple, no masses, no thyromegaly Respiratory: clear to auscultation bilaterally, no wheezing, no crackles. Normal respiratory effort. No accessory muscle use.  Cardiovascular: Regular rate and rhythm, no murmurs / rubs / gallops. No extremity edema. 2+ pedal pulses. No carotid bruits.  Abdomen: no tenderness, no masses palpated. No hepatosplenomegaly. Bowel sounds positive.  Musculoskeletal: no clubbing / cyanosis. No joint deformity upper and lower extremities. Good ROM, no contractures. Normal muscle tone.  Skin: no rashes, lesions, ulcers. No induration Neurologic: CN 2-12 grossly intact. Sensation intact, DTR normal. Strength 5/5 in all 4.  Psychiatric: Normal judgment and insight. Alert and oriented x 3. Normal mood.     Labs on Admission: I have personally reviewed following labs and imaging studies  CBC: Recent Labs  Lab 11/26/18 1521 11/29/18 0332  WBC 5.8 10.3  NEUTROABS  --  7.9*  HGB 12.5 13.4  HCT 38.6 41.2  MCV 90.4 90.4  PLT 262 250   Basic Metabolic Panel: Recent Labs  Lab 11/26/18 1521 11/29/18 0332  NA 136 129*  K 4.2 3.6  CL 104 95*  CO2 22 21*  GLUCOSE 91 106*  BUN 10 14  CREATININE 0.86 0.85  CALCIUM 9.1 9.1  MG 2.2  --    GFR: Estimated Creatinine Clearance: 32.7 mL/min (by C-G formula based on SCr of 0.85 mg/dL). Liver Function Tests: Recent Labs  Lab 11/26/18 1521 11/29/18 0332  AST 32 34  ALT 18 19  ALKPHOS 65 68  BILITOT 0.4 0.9  PROT 7.3 7.8  ALBUMIN 3.5 3.9   Recent Labs  Lab 11/26/18 1521  LIPASE 31   No results for input(s): AMMONIA in the last 168 hours. Coagulation Profile: Recent Labs  Lab 11/26/18 1521 11/29/18 0332  INR 1.42 1.19  Cardiac Enzymes: Recent Labs  Lab  11/26/18 2336 11/27/18 0637 11/27/18 1031  TROPONINI <0.03 <0.03 <0.03   BNP (last 3 results) No results for input(s): PROBNP in the last 8760 hours. HbA1C: Recent Labs    11/27/18 0637  HGBA1C 5.4   CBG: Recent Labs  Lab 11/29/18 0317  GLUCAP 100*   Lipid Profile: Recent Labs    11/27/18 0637  CHOL 223*  HDL 74  LDLCALC 130*  TRIG 93  CHOLHDL 3.0   Thyroid Function Tests: No results for input(s): TSH, T4TOTAL, FREET4, T3FREE, THYROIDAB in the last 72 hours. Anemia Panel: No results for input(s): VITAMINB12, FOLATE, FERRITIN, TIBC, IRON, RETICCTPCT in the last 72 hours. Urine analysis:    Component Value Date/Time   COLORURINE YELLOW 11/26/2018 [place] 11/26/2018 1634   LABSPEC 1.005 11/26/2018 1634   PHURINE 7.0 11/26/2018 1634   GLUCOSEU NEGATIVE 11/26/2018 1634   HGBUR NEGATIVE 11/26/2018 1634   BILIRUBINUR NEGATIVE 11/26/2018 1634   KETONESUR NEGATIVE 11/26/2018 1634   PROTEINUR NEGATIVE 11/26/2018 1634   UROBILINOGEN 0.2 06/09/2015 2120   NITRITE NEGATIVE 11/26/2018 1634   LEUKOCYTESUR NEGATIVE 11/26/2018 1634    Radiological Exams on Admission: Mr Brain Wo Contrast  Result Date: 11/29/2018 CLINICAL DATA:  Subacute neuro deficits.  Left-sided heaviness EXAM: MRI HEAD WITHOUT CONTRAST MRI CERVICAL SPINE WITHOUT CONTRAST TECHNIQUE: Multiplanar, multiecho pulse sequences of the brain and surrounding structures, and cervical spine, to include the craniocervical junction and cervicothoracic junction, were obtained without intravenous contrast. COMPARISON:  Brain MRI from 2 days ago FINDINGS: MRI HEAD FINDINGS Brain: No acute infarction, hemorrhage, hydrocephalus, extra-axial collection or mass lesion. Mild FLAIR hyperintensity in the cerebral white matter from chronic small vessel ischemia. Remote small vessel infarct in the left thalamus and right cerebellum. Mild for age cerebral volume loss Vascular: Major flow voids are preserved Skull and  upper cervical spine: Negative for marrow lesion. Cervical spine reported below. Sinuses/Orbits: Bilateral cataract resection. MRI CERVICAL SPINE FINDINGS Alignment: 3 mm of retrolisthesis at C3-4, degenerative. Vertebrae: No fracture, evidence of discitis, or bone lesion. The T3 body is darker than the others on T1 weighted imaging. This bone appears normal on recent CT and a similar appearance was seen on 02/01/2017 MRI, likely differential marrow. Cord: Normal signal and morphology when accounting for motion artifact. Posterior Fossa, vertebral arteries, paraspinal tissues: Negative Disc levels: C2-3: Small central disc protrusion without impingement C3-4: Disc narrowing and bulging with asymmetric right-sided uncovertebral ridging. Retrolisthesis at this level as quantified above. Likely mild right foraminal stenosis. Mild noncompressive spinal stenosis C4-5: Disc narrowing with posterior disc osteophyte complex. Bilateral uncovertebral spurring with bilateral foraminal narrowing. Noncompressive spinal stenosis C5-6: Degenerative disc narrowing with right eccentric bulge and uncovertebral ridging. Bilateral foraminal stenosis, grading difficult due to motion artifact. Mild noncompressive spinal stenosis. C6-7: Disc narrowing and bulging. Likely mild foraminal narrowing. Patent spinal canal C7-T1:Unremarkable. IMPRESSION: Brain MRI: 1.  No acute finding or change from prior. 2. Chronic small vessel ischemic change. Cervical MRI: 1. Mild and noncompressive degenerative spinal stenosis from C3-4 to C6-7. 2. Foraminal stenosis bilaterally at C4-5 and C5-6, quantification limited by motion artifact. Electronically Signed   By: Monte Fantasia M.D.   On: 11/29/2018 06:59   Mr Cervical Spine Wo Contrast  Result Date: 11/29/2018 CLINICAL DATA:  Subacute neuro deficits.  Left-sided heaviness EXAM: MRI HEAD WITHOUT CONTRAST MRI CERVICAL SPINE WITHOUT CONTRAST TECHNIQUE: Multiplanar, multiecho pulse sequences of the  brain and surrounding structures, and cervical spine, to  include the craniocervical junction and cervicothoracic junction, were obtained without intravenous contrast. COMPARISON:  Brain MRI from 2 days ago FINDINGS: MRI HEAD FINDINGS Brain: No acute infarction, hemorrhage, hydrocephalus, extra-axial collection or mass lesion. Mild FLAIR hyperintensity in the cerebral white matter from chronic small vessel ischemia. Remote small vessel infarct in the left thalamus and right cerebellum. Mild for age cerebral volume loss Vascular: Major flow voids are preserved Skull and upper cervical spine: Negative for marrow lesion. Cervical spine reported below. Sinuses/Orbits: Bilateral cataract resection. MRI CERVICAL SPINE FINDINGS Alignment: 3 mm of retrolisthesis at C3-4, degenerative. Vertebrae: No fracture, evidence of discitis, or bone lesion. The T3 body is darker than the others on T1 weighted imaging. This bone appears normal on recent CT and a similar appearance was seen on 02/01/2017 MRI, likely differential marrow. Cord: Normal signal and morphology when accounting for motion artifact. Posterior Fossa, vertebral arteries, paraspinal tissues: Negative Disc levels: C2-3: Small central disc protrusion without impingement C3-4: Disc narrowing and bulging with asymmetric right-sided uncovertebral ridging. Retrolisthesis at this level as quantified above. Likely mild right foraminal stenosis. Mild noncompressive spinal stenosis C4-5: Disc narrowing with posterior disc osteophyte complex. Bilateral uncovertebral spurring with bilateral foraminal narrowing. Noncompressive spinal stenosis C5-6: Degenerative disc narrowing with right eccentric bulge and uncovertebral ridging. Bilateral foraminal stenosis, grading difficult due to motion artifact. Mild noncompressive spinal stenosis. C6-7: Disc narrowing and bulging. Likely mild foraminal narrowing. Patent spinal canal C7-T1:Unremarkable. IMPRESSION: Brain MRI: 1.  No acute  finding or change from prior. 2. Chronic small vessel ischemic change. Cervical MRI: 1. Mild and noncompressive degenerative spinal stenosis from C3-4 to C6-7. 2. Foraminal stenosis bilaterally at C4-5 and C5-6, quantification limited by motion artifact. Electronically Signed   By: Monte Fantasia M.D.   On: 11/29/2018 06:59   Ct Head Code Stroke Wo Contrast  Result Date: 11/29/2018 CLINICAL DATA:  Code stroke. Initial evaluation for acute left-sided numbness. EXAM: CT HEAD WITHOUT CONTRAST TECHNIQUE: Contiguous axial images were obtained from the base of the skull through the vertex without intravenous contrast. COMPARISON:  Prior MRI from 11/27/2018 FINDINGS: Brain: Atrophy with mild-to-moderate chronic small vessel ischemic disease. Small remote lacunar infarct at the left thalamus. No acute intracranial hemorrhage. No acute large vessel territory infarct. No mass lesion, midline shift or mass effect. No hydrocephalus. No extra-axial fluid collection. Vascular: No hyperdense vessel. Calcified atherosclerosis at the skull base. Skull: Scalp soft tissues and calvarium within normal limits. Sinuses/Orbits: Globes and orbital soft tissues normal. Mild scattered mucosal thickening within the ethmoidal air cells and maxillary sinuses. Paranasal sinuses are otherwise clear. No mastoid effusion. Other: None. ASPECTS [facility] Stroke Program Early CT Score) - Ganglionic level infarction (caudate, lentiform nuclei, internal capsule, insula, M1-M3 cortex): 7 - Supraganglionic infarction (M4-M6 cortex): 3 Total score (0-10 with 10 being normal): 10 IMPRESSION: 1. Stable head CT.  No acute intracranial abnormality identified. 2. ASPECTS is 10. 3. Atrophy with mild-to-moderate chronic small vessel ischemic disease, stable. These results were communicated to Dr. Leonel Ramsay at [place] 2/5/2020by text page via the [facility] messaging system. Electronically Signed   By: Jeannine Boga M.D.   On: 11/29/2018 03:02    EKG:  Independently reviewed.  Normal sinus rhythm.  Has chronic right bundle branch block.  Her EKGs are unchanged from previous EKGs when compared.  Assessment/Plan Principal Problem:   Left hand weakness Active Problems:   Essential hypertension, benign   Paroxysmal atrial fibrillation (HCC)   Sinoatrial node dysfunction (HCC)   Coronary artery disease  involving coronary bypass graft of native heart with angina pectoris (HCC)   Hyponatremia   Hypothyroidism   CKD (chronic kidney disease), stage III (HCC)   Dehydration with hyponatremia   Recurrent left hand weakness: With no neurological deficit.  Probably musculoskeletal pain or peripheral neuropathy.  Currently with no neurological deficits.  Underwent repeat MRI of the brain with no evidence of recurrent stroke.  Acute stroke ruled out.  Patient also has electrolyte abnormality including hyponatremia. Agree with monitoring in the hospital.  Will check neurochecks every 4 hours.  Ambulate with physical therapy.  Resume aspirin and Eliquis.  She is already on a statin that she will continue.  She had repeat CTA shows chronic disease.  2D echocardiogram with normal ejection fraction done 1 day ago.  No indication to repeat it.  She will work with physical therapy.  Anticipate discharge home tomorrow morning if no recurrence of symptoms.  She will be followed by neurology.  Hyponatremia: With hypochloremia.  Patient with poor appetite and occasional nausea.  Clinically dehydrated.  Will treat with isotonic fluid and monitor sodium level.  CKD stage III: Her renal functions at her usual levels.  Monitor.  Hypothyroidism: Clinically euthyroid.  Her thyroid function panel was checked and was normal.  Hypertension: Accelerated on presentation.  She will resume her home medications.  Paroxysmal atrial fibrillation: Currently in sinus rhythm.  Rate controlled.  She will resume Eliquis.  She is on beta-blockers.  History of coronary artery disease:  Currently without any chest pain.  Continue aspirin, beta-blockers and a statin.    DVT prophylaxis: Patient on Eliquis. Code Status: Full code. Family Communication: No family at bedside. Disposition Plan: Home with home health. Consults called: Neurology. Admission status: Observation MedSurg.   Barb Merino MD [facility] Pager [phone]  If 7PM-7AM, please contact night-coverage [email or web address] Password [email or web address]  11/29/2018, 8:50 AM

## 2018-11-29 NOTE — ED Notes (Signed)
Daughter called and patient spoke with patient .

## 2018-11-29 NOTE — ED Notes (Signed)
Currently in MRI.

## 2018-11-29 NOTE — ED Notes (Signed)
Patient assisted to bathroom , good steady gait.

## 2018-11-29 NOTE — Evaluation (Signed)
Physical Therapy Evaluation Patient Details Name: Maria [name] MRN: [id number] DOB: Nov 29, 1923 Today's Date: 11/29/2018   History of Present Illness  Pt is a 85 y/o female admitted secondary to L hand heaviness, likely secondary to TIA. Pt with recent admission for similar symptoms. MRI negative for acute abnormality. PMH includes HTN, a fib, R carotid stenosis, CAD, CKD, dCHF.   Clinical Impression  Pt admitted secondary to problem above with deficits below. Pt with mild unsteadiness, however, no overt LOB noted. Required min guard to supervision throughout mobility tasks. Educated about use of cane for increased stability and safety. Anticipate pt will progress well and will not need follow up PT services. Will continue to follow acutely to maximize functional mobility independence and safety.     Follow Up Recommendations No PT follow up    Equipment Recommendations  None recommended by PT    Recommendations for Other Services       Precautions / Restrictions Precautions Precautions: Fall Restrictions Weight Bearing Restrictions: No      Mobility  Bed Mobility Overal bed mobility: Modified Independent                Transfers Overall transfer level: Needs assistance Equipment used: None Transfers: Sit to/from Stand Sit to Stand: Min guard         General transfer comment: Min guard for safety. No LOB noted.   Ambulation/Gait Ambulation/Gait assistance: Min guard;Supervision Gait Distance (Feet): 300 Feet Assistive device: None Gait Pattern/deviations: Step-through pattern;Decreased stride length Gait velocity: Decreased   General Gait Details: Mild unsteadiness noted, however, no overt LOB noted. Pt reports feeling slightly weaker than baseline secondary to being in the hospital over the past week. No overt LOB noted.   Stairs            Wheelchair Mobility    Modified Rankin (Stroke Patients Only) Modified Rankin (Stroke Patients  Only) Pre-Morbid Rankin Score: No significant disability Modified Rankin: No significant disability     Balance Overall balance assessment: Mild deficits observed, not formally tested                                           Pertinent Vitals/Pain Pain Assessment: Faces Faces Pain Scale: Hurts a little bit Pain Location: headache Pain Descriptors / Indicators: Headache Pain Intervention(s): Limited activity within patient's tolerance;Monitored during session;Repositioned    Home Living Family/patient expects to be discharged to:: Private residence Living Arrangements: Alone Available Help at Discharge: Family;Available PRN/intermittently Type of Home: Independent living facility Home Access: Level entry     Home Layout: One level Home Equipment: Cane - single point      Prior Function Level of Independence: Independent with assistive device(s)         Comments: Reports using cane for community ambulation.      Hand Dominance        Extremity/Trunk Assessment   Upper Extremity Assessment Upper Extremity Assessment: Overall WFL for tasks assessed    Lower Extremity Assessment Lower Extremity Assessment: Generalized weakness    Cervical / Trunk Assessment Cervical / Trunk Assessment: Normal  Communication   Communication: HOH  Cognition Arousal/Alertness: Awake/alert Behavior During Therapy: WFL for tasks assessed/performed Overall Cognitive Status: Within Functional Limits for tasks assessed  General Comments General comments (skin integrity, edema, etc.): [name] present during session     Exercises     Assessment/Plan    PT Assessment Patient needs continued PT services  PT Problem List Decreased balance;Decreased strength;Decreased mobility;Decreased knowledge of precautions       PT Treatment Interventions Gait training;DME instruction;Functional mobility  training;Therapeutic activities;Therapeutic exercise;Balance training;Patient/family education    PT Goals (Current goals can be found in the Care Plan section)  Acute Rehab PT Goals Patient Stated Goal: to go home PT Goal Formulation: With patient Time For Goal Achievement: 12/13/18 Potential to Achieve Goals: Good    Frequency Min 3X/week   Barriers to discharge        Co-evaluation               AM-PAC PT "6 Clicks" Mobility  Outcome Measure Help needed turning from your back to your side while in a flat bed without using bedrails?: None Help needed moving from lying on your back to sitting on the side of a flat bed without using bedrails?: None Help needed moving to and from a bed to a chair (including a wheelchair)?: A Little Help needed standing up from a chair using your arms (e.g., wheelchair or bedside chair)?: A Little Help needed to walk in hospital room?: A Little Help needed climbing 3-5 steps with a railing? : A Little 6 Click Score: 20    End of Session Equipment Utilized During Treatment: Gait belt Activity Tolerance: Patient tolerated treatment well Patient left: in bed;with call bell/phone within reach;with family/visitor present Nurse Communication: Mobility status PT Visit Diagnosis: Unsteadiness on feet (R26.81)    Time: 7543-6067 PT Time Calculation (min) (ACUTE ONLY): 13 min   Charges:   PT Evaluation $PT Eval Low Complexity: [place], PT, DPT  Acute Rehabilitation Services  Pager: [phone] Office: [phone]   Rudean Hitt 11/29/2018, 6:08 PM

## 2018-11-29 NOTE — Progress Notes (Addendum)
STROKE TEAM PROGRESS NOTE   INTERVAL HISTORY Patient is in the ED. Home 2 days ago. Complains of L sided heaviness and nausea that has now resolved. Was here with TIA previously. MRI repeat negative today.  Vitals:   11/29/18 0830 11/29/18 0900 11/29/18 0915 11/29/18 0930  BP: (!) 164/61 (!) 151/68 (!) 161/67 (!) 151/65  Pulse: 69 64 66 66  Resp: 11 16 18 17   SpO2: 100% 97% 98% 98%    CBC:  Recent Labs  Lab 11/26/18 1521 11/29/18 0332  WBC 5.8 10.3  NEUTROABS  --  7.9*  HGB 12.5 13.4  HCT 38.6 41.2  MCV 90.4 90.4  PLT 262 812    Basic Metabolic Panel:  Recent Labs  Lab 11/26/18 1521 11/29/18 0332  NA 136 129*  K 4.2 3.6  CL 104 95*  CO2 22 21*  GLUCOSE 91 106*  BUN 10 14  CREATININE 0.86 0.85  CALCIUM 9.1 9.1  MG 2.2  --    Lipid Panel:     Component Value Date/Time   CHOL 223 (H) 11/27/2018 0637   TRIG 93 11/27/2018 0637   HDL 74 11/27/2018 0637   CHOLHDL 3.0 11/27/2018 0637   VLDL 19 11/27/2018 0637   LDLCALC 130 (H) 11/27/2018 0637   HgbA1c:  Lab Results  Component Value Date   HGBA1C 5.4 11/27/2018   Urine Drug Screen: No results found for: LABOPIA, COCAINSCRNUR, LABBENZ, AMPHETMU, THCU, LABBARB  Alcohol Level No results found for: [facility]  IMAGING Mr Brain Wo Contrast  Result Date: 11/29/2018 CLINICAL DATA:  Subacute neuro deficits.  Left-sided heaviness EXAM: MRI HEAD WITHOUT CONTRAST MRI CERVICAL SPINE WITHOUT CONTRAST TECHNIQUE: Multiplanar, multiecho pulse sequences of the brain and surrounding structures, and cervical spine, to include the craniocervical junction and cervicothoracic junction, were obtained without intravenous contrast. COMPARISON:  Brain MRI from 2 days ago FINDINGS: MRI HEAD FINDINGS Brain: No acute infarction, hemorrhage, hydrocephalus, extra-axial collection or mass lesion. Mild FLAIR hyperintensity in the cerebral white matter from chronic small vessel ischemia. Remote small vessel infarct in the left thalamus and right  cerebellum. Mild for age cerebral volume loss Vascular: Major flow voids are preserved Skull and upper cervical spine: Negative for marrow lesion. Cervical spine reported below. Sinuses/Orbits: Bilateral cataract resection. MRI CERVICAL SPINE FINDINGS Alignment: 3 mm of retrolisthesis at C3-4, degenerative. Vertebrae: No fracture, evidence of discitis, or bone lesion. The T3 body is darker than the others on T1 weighted imaging. This bone appears normal on recent CT and a similar appearance was seen on 02/01/2017 MRI, likely differential marrow. Cord: Normal signal and morphology when accounting for motion artifact. Posterior Fossa, vertebral arteries, paraspinal tissues: Negative Disc levels: C2-3: Small central disc protrusion without impingement C3-4: Disc narrowing and bulging with asymmetric right-sided uncovertebral ridging. Retrolisthesis at this level as quantified above. Likely mild right foraminal stenosis. Mild noncompressive spinal stenosis C4-5: Disc narrowing with posterior disc osteophyte complex. Bilateral uncovertebral spurring with bilateral foraminal narrowing. Noncompressive spinal stenosis C5-6: Degenerative disc narrowing with right eccentric bulge and uncovertebral ridging. Bilateral foraminal stenosis, grading difficult due to motion artifact. Mild noncompressive spinal stenosis. C6-7: Disc narrowing and bulging. Likely mild foraminal narrowing. Patent spinal canal C7-T1:Unremarkable. IMPRESSION: Brain MRI: 1.  No acute finding or change from prior. 2. Chronic small vessel ischemic change. Cervical MRI: 1. Mild and noncompressive degenerative spinal stenosis from C3-4 to C6-7. 2. Foraminal stenosis bilaterally at C4-5 and C5-6, quantification limited by motion artifact. Electronically Signed   By: Monte Fantasia  M.D.   On: 11/29/2018 06:59   Mr Cervical Spine Wo Contrast  Result Date: 11/29/2018 CLINICAL DATA:  Subacute neuro deficits.  Left-sided heaviness EXAM: MRI HEAD WITHOUT CONTRAST  MRI CERVICAL SPINE WITHOUT CONTRAST TECHNIQUE: Multiplanar, multiecho pulse sequences of the brain and surrounding structures, and cervical spine, to include the craniocervical junction and cervicothoracic junction, were obtained without intravenous contrast. COMPARISON:  Brain MRI from 2 days ago FINDINGS: MRI HEAD FINDINGS Brain: No acute infarction, hemorrhage, hydrocephalus, extra-axial collection or mass lesion. Mild FLAIR hyperintensity in the cerebral white matter from chronic small vessel ischemia. Remote small vessel infarct in the left thalamus and right cerebellum. Mild for age cerebral volume loss Vascular: Major flow voids are preserved Skull and upper cervical spine: Negative for marrow lesion. Cervical spine reported below. Sinuses/Orbits: Bilateral cataract resection. MRI CERVICAL SPINE FINDINGS Alignment: 3 mm of retrolisthesis at C3-4, degenerative. Vertebrae: No fracture, evidence of discitis, or bone lesion. The T3 body is darker than the others on T1 weighted imaging. This bone appears normal on recent CT and a similar appearance was seen on 02/01/2017 MRI, likely differential marrow. Cord: Normal signal and morphology when accounting for motion artifact. Posterior Fossa, vertebral arteries, paraspinal tissues: Negative Disc levels: C2-3: Small central disc protrusion without impingement C3-4: Disc narrowing and bulging with asymmetric right-sided uncovertebral ridging. Retrolisthesis at this level as quantified above. Likely mild right foraminal stenosis. Mild noncompressive spinal stenosis C4-5: Disc narrowing with posterior disc osteophyte complex. Bilateral uncovertebral spurring with bilateral foraminal narrowing. Noncompressive spinal stenosis C5-6: Degenerative disc narrowing with right eccentric bulge and uncovertebral ridging. Bilateral foraminal stenosis, grading difficult due to motion artifact. Mild noncompressive spinal stenosis. C6-7: Disc narrowing and bulging. Likely mild  foraminal narrowing. Patent spinal canal C7-T1:Unremarkable. IMPRESSION: Brain MRI: 1.  No acute finding or change from prior. 2. Chronic small vessel ischemic change. Cervical MRI: 1. Mild and noncompressive degenerative spinal stenosis from C3-4 to C6-7. 2. Foraminal stenosis bilaterally at C4-5 and C5-6, quantification limited by motion artifact. Electronically Signed   By: Monte Fantasia M.D.   On: 11/29/2018 06:59   Ct Head Code Stroke Wo Contrast  Result Date: 11/29/2018 CLINICAL DATA:  Code stroke. Initial evaluation for acute left-sided numbness. EXAM: CT HEAD WITHOUT CONTRAST TECHNIQUE: Contiguous axial images were obtained from the base of the skull through the vertex without intravenous contrast. COMPARISON:  Prior MRI from 11/27/2018 FINDINGS: Brain: Atrophy with mild-to-moderate chronic small vessel ischemic disease. Small remote lacunar infarct at the left thalamus. No acute intracranial hemorrhage. No acute large vessel territory infarct. No mass lesion, midline shift or mass effect. No hydrocephalus. No extra-axial fluid collection. Vascular: No hyperdense vessel. Calcified atherosclerosis at the skull base. Skull: Scalp soft tissues and calvarium within normal limits. Sinuses/Orbits: Globes and orbital soft tissues normal. Mild scattered mucosal thickening within the ethmoidal air cells and maxillary sinuses. Paranasal sinuses are otherwise clear. No mastoid effusion. Other: None. ASPECTS [facility] Stroke Program Early CT Score) - Ganglionic level infarction (caudate, lentiform nuclei, internal capsule, insula, M1-M3 cortex): 7 - Supraganglionic infarction (M4-M6 cortex): 3 Total score (0-10 with 10 being normal): 10 IMPRESSION: 1. Stable head CT.  No acute intracranial abnormality identified. 2. ASPECTS is 10. 3. Atrophy with mild-to-moderate chronic small vessel ischemic disease, stable. These results were communicated to Dr. Leonel Ramsay at [place] 2/5/2020by text page via the [facility] messaging  system. Electronically Signed   By: Jeannine Boga M.D.   On: 11/29/2018 03:02    PHYSICAL EXAM Frail elderly Caucasian  lady not in distress. . Afebrile. Head is nontraumatic. Neck is supple without bruit.    Cardiac exam no murmur or gallop. Lungs are clear to auscultation. Distal pulses are well felt. Neurological Exam ;  Awake  Alert oriented x 3. Normal speech and language.eye movements full without nystagmus.fundi were not visualized. Vision acuity and fields appear normal. Hearing is normal. Palatal movements are normal. Face symmetric. Tongue midline. Normal strength, tone, reflexes and coordination. Normal sensation. Gait deferred.   ASSESSMENT/PLAN Ms. CHAYLA SHANDS is a 85 y.o. female with history of hypertension, atrial fibrillation on Xarelto, right carotid stenosis, coronary artery disease, CKD, chronic diastolic heart failure presenting with 3-day history of headache, double visions, L-hand weakness, L hand numbness and generalized fatigue.   Possible recurrent TIA x 3 in the past 2 weeks on [facility] for AF with chage to Elquis from Xarelto 2 days ago  MRI  No stroke.   LDL 130  HgbA1c 5.4  Eliquis for VTE prophylaxis  Eliquis (apixaban) daily prior to admission (changed from [place] during admission earlier this week) now on Eliquis (apixaban) daily.   Therapy recommendations:  [place] OT (from admission earlier in the week). No PT needed  Disposition:  pending (from ALF)  Follow up with Dr. [name] at d/c  [facility] for d/c from stroke standpoint. We will sign off. Call for questions.  Atrial Fibrillation  Home anticoagulation:  Eliquis (apixaban) daily continued in the hospital . Continue Eliquis (apixaban) daily at discharge . No indication for additional aspirin or change in agent  She states she is taking as prescribed and did not miss a dose. Lots of discussion surrounding how to take and her difficulty in taking twice a day.  Final decision that she will remain on  Eliquis.  She is agreeable.  Hypertension  Stable . BP goal normotensive  Hyperlipidemia  Home meds:  lipitor 20  LDL 130, goal < 70  Continue statin at discharge  Other Stroke Risk Factors  Advanced age  Moderate Obstructive sleep apnea, on CPAP at home  Chronic diastolic Congestive heart failure  Hx DVT   Carotid artery stenosis   Other Active Problems  Pulm HTN, mod to severe  CKD stage III  Has been evaluated for mod to severe neuropathy, followed by Dr. Sherre Poot day # 0  Burnetta Sabin, MSN, APRN, ANVP-BC, AGPCNP-BC Advanced Practice Stroke Nurse [place] for Schedule & Pager information 11/29/2018 10:21 AM  I have personally obtained history,examined this patient, reviewed notes, independently viewed imaging studies, participated in medical decision making and plan of care.ROS completed by me personally and pertinent positives fully documented  I have made any additions or clarifications directly to the above note. Agree with note above.  She presented with transient   well as left hand heaviness and numbness in the setting of chest pain and accelerated hypertension possible TIA.Marland Kitchenshe has not been taking Xarelto consistently at the same time and may be better served by switching to twice a day medication like eliquis. I discussed the risk-benefit and lack of definitive data proving 1 drug being better than the other.  . Discussed with Dr. Sloan Leiter. Greater than 50% time during this 25 minute visit was spent on counseling and coordination of care about TIA, atrial fibrillation and discussion about stroke prevention and treatment and answering questions.follow-up as an outpatient with her neurologist Dr. Gale Journey, MD Medical Director [place] Pager: [phone] 11/29/2018 10:21 AM  To  contact Stroke Continuity provider, please refer to [email or web address] After hours, contact General Neurology

## 2018-11-29 NOTE — ED Notes (Signed)
Maria [name] (SR)-Breakfast/Lunch Tray Ordered @ 0918-per RN-called by Levada Dy

## 2018-11-29 NOTE — ED Notes (Signed)
Code stroke cancelled

## 2018-11-29 NOTE — ED Triage Notes (Signed)
Pt states she woke up this morning around 0100 whit feelings of numbness in her left wrist and hand. Also states some numbness in her left leg. Pt states this numbness is more pronounced than when she left the hospital. Pt alert and oriented x4.

## 2018-11-29 NOTE — ED Provider Notes (Signed)
[facility] EMERGENCY DEPARTMENT Provider Note   CSN: [id number] Arrival date & time: 11/29/18  0241     History   Chief Complaint Chief Complaint  Patient presents with  . Code Stroke    HPI Maria [name] is a 85 y.o. female.  The history is provided by the patient and the EMS personnel.  Neurologic Problem  This is a new problem. Episode onset: unknown. The problem occurs constantly. The problem has been gradually improving. Pertinent negatives include no chest pain. Nothing aggravates the symptoms. Nothing relieves the symptoms.  Patient presents with numbness in left arm.  She also reports numbness in left leg.  She was just discharged from the hospital February 4.  She is unsure when the numbness started.  No focal weakness.  She reports having some neck pain as well.  Past Medical History:  Diagnosis Date  . Anemia   . Atonic bladder 12/11/2013  . Atrial fibrillation ([place])   . Carotid artery occlusion    right carotid stenosis 40-60%, 4/08, neg for stenosis repeat study 11/11  . Celiac disease   . Chronic anticoagulation CHADS2VASC2 score 7 10/18/2014  . Chronic diastolic CHF (congestive heart failure) (HCC)    takes Lasix daily  . CKD (chronic kidney disease), stage III ([place])   . Coronary artery disease    a.  08/2013 (LIMA-LAD, rSVG to LCX and Lt atrial clip),  . Depression   . DVT (deep venous thrombosis) ([place]) 09/2013  . Gastric ulcer    6-52month ago  . GERD (gastroesophageal reflux disease)    takes Omeprazole daily  . GI bleeding   . Hemorrhoids   . Hot flashes    takes ZOloft 3 times a week  . Hypertension   . Hypothyroidism    takes Synthroid daily as result of AMiodarone  . Insomnia   . Macular degeneration   . Mild aortic insufficiency    a. Echo 10/2013: mild AI.  .Marland KitchenModerate mitral regurgitation by prior echocardiogram    a. Echo 10/2013.  . Moderate obstructive sleep apnea    uses CPAP;sleep study about 4-526monthago  .  MVP (mitral valve prolapse)   . Osteoporosis   . PAF (paroxysmal atrial fibrillation) ([place]  . Peripheral edema    left  . Presbycusis   . Pulmonary hypertension (HCCalvert  . RLS (restless legs syndrome)   . Thyroid nodule    56m80mno change 11/11  . Urethral stricture   . Urinary anastomotic stricture   . Urinary retention   . Vitamin D deficiency     Patient Active Problem List   Diagnosis Date Noted  . CVA (cerebral vascular accident) (HCCMillbourne2/11/2018  . Chest pain 11/26/2018  . On amiodarone therapy 07/13/2017  . Frailty 07/13/2017  . CKD (chronic kidney disease), stage III (HCCApison . Hypothyroidism 10/18/2014  . Chronic anticoagulation CHADS2VASC2 score 7 10/18/2014  . Right leg pain 07/22/2014  . Gout of foot 04/10/2014  . Atonic bladder 12/11/2013  . Closed right hip fracture (HCCCallisburg2/13/2015  . Protein-calorie malnutrition, severe (HCCSt. Louis2/08/2013  . DVT (deep venous thrombosis) (HCC[place]icks[place]2/07/2013  . Coronary artery disease involving coronary bypass graft of native heart with angina pectoris (HCCH[place]1/19/2014  . Mitral regurgitation 08/07/2013  . Pulmonary hypertension, moderate to severe (HCCHughesville0/14/2014  . Essential hypertension, benign 08/02/2013  . Nontoxic uninodular goiter 08/02/2013  . Unspecified hereditary and idiopathic peripheral neuropathy 08/02/2013  . Carotid disease, bilateral (HCCWallaceton0/06/2013  .  Unspecified vitamin D deficiency 08/02/2013  . Depressive disorder, not elsewhere classified 08/02/2013  . Paroxysmal atrial fibrillation ([place]) 08/02/2013  . Trifascicular block 08/02/2013  . Gastric ulcer, unspecified as acute or chronic, without mention of hemorrhage, perforation, or obstruction 08/02/2013  . Sinoatrial node dysfunction ([place]) 08/02/2013    Past Surgical History:  Procedure Laterality Date  . APPENDECTOMY    . bilateral cataract surgery    . CARDIAC CATHETERIZATION  08-06-13  . CORONARY ARTERY BYPASS GRAFT N/A 08/28/2013   Procedure:  CORONARY ARTERY BYPASS GRAFTING (CABG) x2 using right greater saphenous vein and left internal mammary artery. ;  Surgeon: Grace Isaac, MD;  Location: [facility];  Service: Open Heart Surgery;  Laterality: N/A;  . HIP ARTHROPLASTY Right 12/09/2013   Procedure: ARTHROPLASTY BIPOLAR HIP CEMENTED;  Surgeon: Kerin Salen, MD;  Location: [facility];  Service: Orthopedics;  Laterality: Right;  . INTRAOPERATIVE TRANSESOPHAGEAL ECHOCARDIOGRAM N/A 08/28/2013   Procedure: INTRAOPERATIVE TRANSESOPHAGEAL ECHOCARDIOGRAM;  Surgeon: Grace Isaac, MD;  Location: [facility];  Service: Open Heart Surgery;  Laterality: N/A;  . LEFT AND RIGHT HEART CATHETERIZATION WITH CORONARY ANGIOGRAM N/A 08/16/2013   Procedure: LEFT AND RIGHT HEART CATHETERIZATION WITH CORONARY ANGIOGRAM;  Surgeon: [name], MD;  Location: [facility] CATH LAB;  Service: Cardiovascular;  Laterality: N/A;  . MANDIBLE SURGERY    . TCS    . TONSILLECTOMY    . trigger thumb    . TUBAL LIGATION       OB History   No obstetric history on file.      Home Medications    Prior to Admission medications   Medication Sig Start Date End Date Taking? Authorizing Provider  acetaminophen (TYLENOL) 500 MG tablet Take 1,000 mg by mouth every 6 (six) hours as needed.    [provider]  amiodarone (PACERONE) 100 MG tablet Take 100 mg by mouth daily. 08/14/15   [provider]  amLODipine (NORVASC) 2.5 MG tablet Take 1 tablet (2.5 mg total) by mouth daily for 30 days. 11/28/18 12/28/18  Amin, Jeanella Flattery, MD  apixaban (ELIQUIS) 2.5 MG TABS tablet Take 1 tablet (2.5 mg total) by mouth 2 (two) times daily for 30 days. 11/28/18 12/28/18  Damita Lack, MD  aspirin EC 81 MG EC tablet Take 1 tablet (81 mg total) by mouth daily for 30 days. 11/28/18 12/28/18  Amin, Jeanella Flattery, MD  atorvastatin (LIPITOR) 40 MG tablet Take 1 tablet (40 mg total) by mouth daily at 6 PM for 30 days. 11/28/18 12/28/18  Amin, Jeanella Flattery, MD  furosemide (LASIX) 20 MG tablet  Take 20 mg by mouth daily.    [provider]  levothyroxine (SYNTHROID, LEVOTHROID) 50 MCG tablet Take 1 tablet (50 mcg total) by mouth daily before breakfast. 01/03/14   Angiulli, Lavon Paganini, PA-C  Magnesium 250 MG TABS Take 250 mg by mouth daily.    [provider]  Melatonin CR 3 MG TBCR Take 3 mg by mouth at bedtime.    [provider]  MULTIPLE VITAMIN PO Take 1 tablet by mouth daily.    [provider]  pantoprazole (PROTONIX) 40 MG tablet Take 40 mg by mouth daily. 09/13/18   [provider]  traZODone (DESYREL) 50 MG tablet Take 25 mg by mouth at bedtime as needed for sleep.    [provider]  Wheat Dextrin (BENEFIBER) POWD Take 1 Scoop by mouth daily.    [provider]    Family History Family  History  Problem Relation Age of Onset  . Heart attack Mother   . CVA Mother   . CAD Father   . Colon cancer Father   . Heart attack Father   . Heart attack Brother   . Peripheral vascular disease Brother   . AAA (abdominal aortic aneurysm) Brother     Social History Social History   Tobacco Use  . Smoking status: Never Smoker  . Smokeless tobacco: Never Used  Substance Use Topics  . Alcohol use: No  . Drug use: No     Allergies   Alendronate sodium; Amoxicillin; Codeine; Fosamax [alendronate sodium]; Gluten meal; Hydrochlorothiazide; Tetanus toxoid, adsorbed; and Tetanus toxoids   Review of Systems Review of Systems  Constitutional: Negative for fever.  Cardiovascular: Negative for chest pain.  Musculoskeletal: Positive for neck pain.  Neurological: Positive for numbness.  All other systems reviewed and are negative.    Physical Exam Updated Vital Signs BP (!) 205/81 (BP Location: Right Arm)   Pulse 76   Resp 14   SpO2 100%   Physical Exam CONSTITUTIONAL: elderly, no acute distress HEAD: Normocephalic/atraumatic EYES: EOMI ENMT: Mucous membranes moist NECK: supple no meningeal  signs SPINE/BACK:entire spine nontender, kyphotic spine, no cervical spine tenderness CV: S1/S2 noted LUNGS: Lungs are clear to auscultation bilaterally, no apparent distress ABDOMEN: soft, nontender  NEURO: Pt is awake/alert/appropriate, moves all extremitiesx4.  No facial droop.  NIHSS - 0 EXTREMITIES: pulses normal/equal, full ROM SKIN: warm, color normal PSYCH: no abnormalities of mood noted, alert and oriented to situation  ED Treatments / Results  Labs (all labs ordered are listed, but only abnormal results are displayed) Labs Reviewed  CBC - Abnormal; Notable for the following components:      Result Value   RDW 22.5 (*)    All other components within normal limits  DIFFERENTIAL - Abnormal; Notable for the following components:   Neutro Abs 7.9 (*)    All other components within normal limits  COMPREHENSIVE METABOLIC PANEL - Abnormal; Notable for the following components:   Sodium 129 (*)    Chloride 95 (*)    CO2 21 (*)    Glucose, Bld 106 (*)    GFR calc non Af Amer 59 (*)    All other components within normal limits  CBG MONITORING, ED - Abnormal; Notable for the following components:   Glucose-Capillary 100 (*)    All other components within normal limits  PROTIME-INR  APTT  I-STAT TROPONIN, ED    EKG EKG Interpretation  Date/Time:  Wednesday November 29 2018 03:15:11 EST Ventricular Rate:  72 PR Interval:    QRS Duration: 145 QT Interval:  456 QTC Calculation: 500 R Axis:   -60 Text Interpretation:  Ectopic atrial rhythm IVCD, consider atypical RBBB LVH with IVCD and secondary repol abnrm Inferior infarct, old Confirmed by Ripley Fraise [phone]) on 11/29/2018 3:21:08 AM   Radiology Mr Brain Wo Contrast  Result Date: 11/27/2018 CLINICAL DATA:  Headache for 3 days. Transient diplopia and left arm weakness. Left arm numbness in the hand. EXAM: MRI HEAD WITHOUT CONTRAST TECHNIQUE: Multiplanar, multiecho pulse sequences of the brain and surrounding structures  were obtained without intravenous contrast. COMPARISON:  Head CT 11/26/2018 and MRI 01/16/2016 FINDINGS: Brain: There is no evidence of acute infarct, intracranial hemorrhage, mass, midline shift, or extra-axial fluid collection. Mild cerebral atrophy is within normal limits for age. Chronic lacunar infarcts are again seen in the left thalamus and cerebellum. Patchy T2 hyperintensities in the cerebral white  matter bilaterally are similar to the prior MRI and nonspecific but compatible with mild-to-moderate chronic small vessel ischemic disease. Vascular: Major intracranial vascular flow voids are preserved. Skull and upper cervical spine: Unremarkable bone marrow signal. Sinuses/Orbits: Bilateral cataract extraction. Mild left maxillary sinus mucosal thickening. Small mastoid effusions. Other: None. IMPRESSION: 1. No acute intracranial abnormality. 2. Mild-to-moderate chronic small vessel ischemic disease. Electronically Signed   By: Logan Bores M.D.   On: 11/27/2018 07:05   Ct Head Code Stroke Wo Contrast  Result Date: 11/29/2018 CLINICAL DATA:  Code stroke. Initial evaluation for acute left-sided numbness. EXAM: CT HEAD WITHOUT CONTRAST TECHNIQUE: Contiguous axial images were obtained from the base of the skull through the vertex without intravenous contrast. COMPARISON:  Prior MRI from 11/27/2018 FINDINGS: Brain: Atrophy with mild-to-moderate chronic small vessel ischemic disease. Small remote lacunar infarct at the left thalamus. No acute intracranial hemorrhage. No acute large vessel territory infarct. No mass lesion, midline shift or mass effect. No hydrocephalus. No extra-axial fluid collection. Vascular: No hyperdense vessel. Calcified atherosclerosis at the skull base. Skull: Scalp soft tissues and calvarium within normal limits. Sinuses/Orbits: Globes and orbital soft tissues normal. Mild scattered mucosal thickening within the ethmoidal air cells and maxillary sinuses. Paranasal sinuses are otherwise  clear. No mastoid effusion. Other: None. ASPECTS [facility] Stroke Program Early CT Score) - Ganglionic level infarction (caudate, lentiform nuclei, internal capsule, insula, M1-M3 cortex): 7 - Supraganglionic infarction (M4-M6 cortex): 3 Total score (0-10 with 10 being normal): 10 IMPRESSION: 1. Stable head CT.  No acute intracranial abnormality identified. 2. ASPECTS is 10. 3. Atrophy with mild-to-moderate chronic small vessel ischemic disease, stable. These results were communicated to Dr. Leonel Ramsay at [place] 2/5/2020by text page via the [facility] messaging system. Electronically Signed   By: Jeannine Boga M.D.   On: 11/29/2018 03:02    Procedures Procedures    Medications Ordered in ED Medications  sodium chloride flush (NS) 0.9 % injection 3 mL (3 mLs Intravenous Given 11/29/18 0333)     Initial Impression / Assessment and Plan / ED Course  I have reviewed the triage vital signs and the nursing notes.  Pertinent labs & imaging results that were available during my care of the patient were reviewed by me and considered in my medical decision making (see chart for details).     3:18 AM Pt came via EMS For possible stroke However, currently symptom free Unclear when symptoms started as she reports it may have started in hospital, discharged yesterday Pt seen in conjunction with neurology dr Leonel Ramsay 4:15 AM After further evaluation by neurology, patient's story is inconsistent and is now reporting weakness. She is not a candidate for TPA or acute intervention.  However per neurology recommendations, patient would benefit from repeat MRI brain as well as MRI C-spine.  There could be a component of cervical radiculopathy and MRI C-spine will shed light on this issue. Physical therapy will be recommended.  Stroke team will follow patient.  Will consult medicine for admission  4:46 AM D/w dr gardner with [facility] hospitalist for admission Final Clinical Impressions(s) / ED Diagnoses     Final diagnoses:  Left sided numbness  Essential hypertension    ED Discharge Orders    None       Ripley Fraise, MD 11/29/18 [phone]

## 2018-11-30 DIAGNOSIS — R531 Weakness: Secondary | ICD-10-CM | POA: Diagnosis not present

## 2018-11-30 DIAGNOSIS — R29898 Other symptoms and signs involving the musculoskeletal system: Secondary | ICD-10-CM | POA: Diagnosis not present

## 2018-11-30 LAB — BASIC METABOLIC PANEL
Anion gap: 9 (ref 5–15)
BUN: 9 mg/dL (ref 8–23)
CO2: 24 mmol/L (ref 22–32)
Calcium: 8.7 mg/dL — ABNORMAL LOW (ref 8.9–10.3)
Chloride: 104 mmol/L (ref 98–111)
Creatinine, Ser: 0.74 mg/dL (ref 0.44–1.00)
GFR calc Af Amer: >60 mL/min (ref >60)
Glucose, Bld: 90 mg/dL (ref 70–99)
Potassium: 4.5 mmol/L (ref 3.5–5.1)
SODIUM: 137 mmol/L (ref 135–145)

## 2018-11-30 MED ORDER — FUROSEMIDE 20 MG PO TABS: 20.0000 mg | ORAL_TABLET | Freq: Every day | ORAL | Status: DC | PRN

## 2018-11-30 NOTE — Evaluation (Signed)
Occupational Therapy Evaluation Patient Details Name: Maria [name] MRN: [id number] DOB: [birth year] Today's Date: 11/30/2018    History of Present Illness Pt is a 85 y/o female admitted secondary to L hand heaviness, likely secondary to TIA. Pt with recent admission for similar symptoms. MRI negative for acute abnormality. PMH includes HTN, a fib, R carotid stenosis, CAD, CKD, dCHF.    Clinical Impression   Pt ist at baseline independent/Mod I with ADLs/sefcare and sup with mobility. Pt uses cane at her ILF. All edcuation has been completed and no further acute OT is indicated at this time    Follow Up Recommendations  No OT follow up;Supervision - Intermittent    Equipment Recommendations  None recommended by OT    Recommendations for Other Services       Precautions / Restrictions Precautions Precautions: Fall Restrictions Weight Bearing Restrictions: No      Mobility Bed Mobility Overal bed mobility: Modified Independent Bed Mobility: Supine to Sit;Sit to Supine              Transfers Overall transfer level: Needs assistance Equipment used: None Transfers: Sit to/from Stand Sit to Stand: Supervision Stand pivot transfers: Supervision            Balance Overall balance assessment: Mild deficits observed, not formally tested Sitting-balance support: Feet supported Sitting balance-Leahy Scale: Good     Standing balance support: During functional activity Standing balance-Leahy Scale: Fair                             ADL either performed or assessed with clinical judgement   ADL Overall ADL's : At baseline;Modified independent                                             Vision Baseline Vision/History: Wears glasses Wears Glasses: Reading only Patient Visual Report: No change from baseline       Perception     Praxis      Pertinent Vitals/Pain Pain Assessment: No/denies pain Faces Pain Scale: No hurt Pain  Intervention(s): Monitored during session     Hand Dominance Right   Extremity/Trunk Assessment Upper Extremity Assessment Upper Extremity Assessment: Overall WFL for tasks assessed   Lower Extremity Assessment Lower Extremity Assessment: Defer to PT evaluation   Cervical / Trunk Assessment Cervical / Trunk Assessment: Normal   Communication Communication Communication: HOH   Cognition Arousal/Alertness: Awake/alert Behavior During Therapy: WFL for tasks assessed/performed Overall Cognitive Status: Within Functional Limits for tasks assessed                                     General Comments       Exercises     Shoulder Instructions      Home Living Family/patient expects to be discharged to:: Private residence Living Arrangements: Alone Available Help at Discharge: Family;Available PRN/intermittently Type of Home: Independent living facility Home Access: Level entry     Home Layout: One level     Bathroom Shower/Tub: Occupational psychologist: Standard Bathroom Accessibility: Yes How Accessible: Accessible via walker Home Equipment: [place] - single point          Prior Functioning/Environment Level of Independence: Independent with assistive device(s)  Comments: Reports using cane for community ambulation. Independent with selfcare        OT Problem List:        OT Treatment/Interventions:      OT Goals(Current goals can be found in the care plan section) Acute Rehab OT Goals Patient Stated Goal: to go home OT Goal Formulation: With patient/family  OT Frequency:     Barriers to D/C:    no barriers       Co-evaluation              AM-PAC OT "6 Clicks" Daily Activity     Outcome Measure Help from another person eating meals?: None Help from another person taking care of personal grooming?: None Help from another person toileting, which includes using toliet, bedpan, or urinal?: None Help from another  person bathing (including washing, rinsing, drying)?: None Help from another person to put on and taking off regular upper body clothing?: None Help from another person to put on and taking off regular lower body clothing?: None 6 Click Score: 24   End of Session Equipment Utilized During Treatment: Gait belt  Activity Tolerance: Patient tolerated treatment well Patient left: in bed;with call bell/phone within reach;with family/visitor present  OT Visit Diagnosis: Unsteadiness on feet (R26.81)                Time: 7639-4320 OT Time Calculation (min): 16 min Charges:  OT General Charges $OT Visit: 1 Visit OT Evaluation $OT Eval Low Complexity: 1 Low    Britt Bottom 11/30/2018, 1:53 PM

## 2018-11-30 NOTE — Progress Notes (Signed)
Discharge teaching complete. Meds, diet, activity, follow up appointments and signs and symptoms reviewed and all questions answered. Copy of instructions given to patient. Patient discharged home via wheelchair with daughter.

## 2018-11-30 NOTE — Discharge Summary (Signed)
Physician Discharge Summary  Maria [name] [id number]:[id number] DOB: 1924-06-25 DOA: 11/29/2018  PCP: [name], MD  Admit date: 11/29/2018 Discharge date: 11/30/2018  Admitted From: home Discharge disposition: home   Recommendations for Outpatient Follow-Up:   1. Neurology follow up 2. BMP 1 week re: Na    Discharge Diagnosis:   Principal Problem:   Left hand weakness Active Problems:   Essential hypertension, benign   Paroxysmal atrial fibrillation (HCC)   Sinoatrial node dysfunction (HCC)   Coronary artery disease involving coronary bypass graft of native heart with angina pectoris (HCC)   Hyponatremia   Hypothyroidism   CKD (chronic kidney disease), stage III (HCC)   Dehydration with hyponatremia    Discharge Condition: Improved.  Diet recommendation: Low sodium, heart healthy  Wound care: None.  Code status: Full.   History of Present Illness:   Maria [name] is a 85 y.o. female with medical history significant of chronic atrial fibrillation, coronary artery disease, stage III chronic kidney disease due to hypertension, chronic diastolic heart failure, history of DVT currently anticoagulated and hypertension who presented to the emergency room last night with recurrent episodes of left hand heaviness after discharge from the hospital earlier in the day.Patient was admitted the day before to the hospital with left-sided weakness and diplopia, however no neurological deficit on examination.  She underwent stroke work-up including MRI that showed no infarction and CTA was positive for left ICA stenosis.  She did have posterior circulation disease.  She was already on Xarelto, it was changed to Eliquis, aspirin was added and Lipitor dose was increased and patient was discharged home.  According to the patient, she went home, went to bed.  She was asked to watch for 5 symptoms including weakness of the arm.  Early morning hours she felt like her left hand is  feeling heavy so she called EMS.  Patient was brought to the ER by EMS, she had no neurological deficit.  Code stroke was called and later on canceled.  Seen by neurology.  On my evaluation in the morning, patient denies any symptoms.  She thinks she is dehydrated. ED Course: Hypertensive on arrival.  NIHSS 0.  Sodium is 129.  It was 136 yesterday.  Electrolytes are normal otherwise.  Patient underwent repeat CTAs with no new findings.  Seen by neurology.  Repeat MRI of the brain and MR of the cervical spine was done.  MRI of the brain did not show any new findings.  MR of the cervical spine showed chronic osteoarthritic changes, no cord compression.  Due to recurrent symptoms, was advised to monitor in the hospital.   Hospital Course by Problem:   Recurrent left hand weakness: With no neurological deficit.  Probably musculoskeletal pain or peripheral neuropathy.   -  Underwent repeat MRI of the brain with no evidence of recurrent stroke.  Acute stroke ruled out.  Patient also has electrolyte abnormality including hyponatremia. - Resume aspirin and Eliquis.  She is already on a statin that she will continue.  She had repeat CTA shows chronic disease.  2D echocardiogram with normal ejection fraction done 1 day ago.  No indication to repeat it.   -discussed with neurology - no further work up indicated  Hyponatremia: With hypochloremia.   -improved to normal with hydration Outpatient follow up -changed lasix to PRN.  CKD stage III:  outpt follow up  Hypothyroidism: Clinically euthyroid.  Her thyroid function panel was checked and was normal.  Hypertension:  Accelerated on presentation.  She will resume her home medications. -adjust as an outpatient  Paroxysmal atrial fibrillation: Currently in sinus rhythm.  Rate controlled.  She will resume Eliquis.  She is on beta-blockers.  History of coronary artery disease: Currently without any chest pain.  Continue aspirin, beta-blockers and a  statin.     Medical Consultants:    neurology  Discharge Exam:   Vitals:   11/30/18 0555 11/30/18 0854  BP: 139/74 (!) 150/53  Pulse: 63 (!) 58  Resp: 18 18  Temp: 98.1 F (36.7 C) (!) 97.4 F (36.3 C)  SpO2: 98% 98%   Vitals:   11/29/18 2000 11/30/18 0000 11/30/18 0555 11/30/18 0854  BP: 132/63 122/74 139/74 (!) 150/53  Pulse: 72 65 63 (!) 58  Resp: 18 19 18 18   Temp: 97.7 F (36.5 C) 98.3 F (36.8 C) 98.1 F (36.7 C) (!) 97.4 F (36.3 C)  TempSrc: Oral Oral Oral Oral  SpO2: 98% 99% 98% 98%  Weight:      Height:        General exam: Appears calm and comfortable.  The results of significant diagnostics from this hospitalization (including imaging, microbiology, ancillary and laboratory) are listed below for reference.     Procedures and Diagnostic Studies:   Mr Brain Wo Contrast  Result Date: 11/29/2018 CLINICAL DATA:  Subacute neuro deficits.  Left-sided heaviness EXAM: MRI HEAD WITHOUT CONTRAST MRI CERVICAL SPINE WITHOUT CONTRAST TECHNIQUE: Multiplanar, multiecho pulse sequences of the brain and surrounding structures, and cervical spine, to include the craniocervical junction and cervicothoracic junction, were obtained without intravenous contrast. COMPARISON:  Brain MRI from 2 days ago FINDINGS: MRI HEAD FINDINGS Brain: No acute infarction, hemorrhage, hydrocephalus, extra-axial collection or mass lesion. Mild FLAIR hyperintensity in the cerebral white matter from chronic small vessel ischemia. Remote small vessel infarct in the left thalamus and right cerebellum. Mild for age cerebral volume loss Vascular: Major flow voids are preserved Skull and upper cervical spine: Negative for marrow lesion. Cervical spine reported below. Sinuses/Orbits: Bilateral cataract resection. MRI CERVICAL SPINE FINDINGS Alignment: 3 mm of retrolisthesis at C3-4, degenerative. Vertebrae: No fracture, evidence of discitis, or bone lesion. The T3 body is darker than the others on T1  weighted imaging. This bone appears normal on recent CT and a similar appearance was seen on 02/01/2017 MRI, likely differential marrow. Cord: Normal signal and morphology when accounting for motion artifact. Posterior Fossa, vertebral arteries, paraspinal tissues: Negative Disc levels: C2-3: Small central disc protrusion without impingement C3-4: Disc narrowing and bulging with asymmetric right-sided uncovertebral ridging. Retrolisthesis at this level as quantified above. Likely mild right foraminal stenosis. Mild noncompressive spinal stenosis C4-5: Disc narrowing with posterior disc osteophyte complex. Bilateral uncovertebral spurring with bilateral foraminal narrowing. Noncompressive spinal stenosis C5-6: Degenerative disc narrowing with right eccentric bulge and uncovertebral ridging. Bilateral foraminal stenosis, grading difficult due to motion artifact. Mild noncompressive spinal stenosis. C6-7: Disc narrowing and bulging. Likely mild foraminal narrowing. Patent spinal canal C7-T1:Unremarkable. IMPRESSION: Brain MRI: 1.  No acute finding or change from prior. 2. Chronic small vessel ischemic change. Cervical MRI: 1. Mild and noncompressive degenerative spinal stenosis from C3-4 to C6-7. 2. Foraminal stenosis bilaterally at C4-5 and C5-6, quantification limited by motion artifact. Electronically Signed   By: Monte Fantasia M.D.   On: 11/29/2018 06:59   Mr Cervical Spine Wo Contrast  Result Date: 11/29/2018 CLINICAL DATA:  Subacute neuro deficits.  Left-sided heaviness EXAM: MRI HEAD WITHOUT CONTRAST MRI CERVICAL SPINE WITHOUT CONTRAST TECHNIQUE: Multiplanar,  multiecho pulse sequences of the brain and surrounding structures, and cervical spine, to include the craniocervical junction and cervicothoracic junction, were obtained without intravenous contrast. COMPARISON:  Brain MRI from 2 days ago FINDINGS: MRI HEAD FINDINGS Brain: No acute infarction, hemorrhage, hydrocephalus, extra-axial collection or mass  lesion. Mild FLAIR hyperintensity in the cerebral white matter from chronic small vessel ischemia. Remote small vessel infarct in the left thalamus and right cerebellum. Mild for age cerebral volume loss Vascular: Major flow voids are preserved Skull and upper cervical spine: Negative for marrow lesion. Cervical spine reported below. Sinuses/Orbits: Bilateral cataract resection. MRI CERVICAL SPINE FINDINGS Alignment: 3 mm of retrolisthesis at C3-4, degenerative. Vertebrae: No fracture, evidence of discitis, or bone lesion. The T3 body is darker than the others on T1 weighted imaging. This bone appears normal on recent CT and a similar appearance was seen on 02/01/2017 MRI, likely differential marrow. Cord: Normal signal and morphology when accounting for motion artifact. Posterior Fossa, vertebral arteries, paraspinal tissues: Negative Disc levels: C2-3: Small central disc protrusion without impingement C3-4: Disc narrowing and bulging with asymmetric right-sided uncovertebral ridging. Retrolisthesis at this level as quantified above. Likely mild right foraminal stenosis. Mild noncompressive spinal stenosis C4-5: Disc narrowing with posterior disc osteophyte complex. Bilateral uncovertebral spurring with bilateral foraminal narrowing. Noncompressive spinal stenosis C5-6: Degenerative disc narrowing with right eccentric bulge and uncovertebral ridging. Bilateral foraminal stenosis, grading difficult due to motion artifact. Mild noncompressive spinal stenosis. C6-7: Disc narrowing and bulging. Likely mild foraminal narrowing. Patent spinal canal C7-T1:Unremarkable. IMPRESSION: Brain MRI: 1.  No acute finding or change from prior. 2. Chronic small vessel ischemic change. Cervical MRI: 1. Mild and noncompressive degenerative spinal stenosis from C3-4 to C6-7. 2. Foraminal stenosis bilaterally at C4-5 and C5-6, quantification limited by motion artifact. Electronically Signed   By: Monte Fantasia M.D.   On: 11/29/2018  06:59   Ct Head Code Stroke Wo Contrast  Result Date: 11/29/2018 CLINICAL DATA:  Code stroke. Initial evaluation for acute left-sided numbness. EXAM: CT HEAD WITHOUT CONTRAST TECHNIQUE: Contiguous axial images were obtained from the base of the skull through the vertex without intravenous contrast. COMPARISON:  Prior MRI from 11/27/2018 FINDINGS: Brain: Atrophy with mild-to-moderate chronic small vessel ischemic disease. Small remote lacunar infarct at the left thalamus. No acute intracranial hemorrhage. No acute large vessel territory infarct. No mass lesion, midline shift or mass effect. No hydrocephalus. No extra-axial fluid collection. Vascular: No hyperdense vessel. Calcified atherosclerosis at the skull base. Skull: Scalp soft tissues and calvarium within normal limits. Sinuses/Orbits: Globes and orbital soft tissues normal. Mild scattered mucosal thickening within the ethmoidal air cells and maxillary sinuses. Paranasal sinuses are otherwise clear. No mastoid effusion. Other: None. ASPECTS [facility] Stroke Program Early CT Score) - Ganglionic level infarction (caudate, lentiform nuclei, internal capsule, insula, M1-M3 cortex): 7 - Supraganglionic infarction (M4-M6 cortex): 3 Total score (0-10 with 10 being normal): 10 IMPRESSION: 1. Stable head CT.  No acute intracranial abnormality identified. 2. ASPECTS is 10. 3. Atrophy with mild-to-moderate chronic small vessel ischemic disease, stable. These results were communicated to Dr. Leonel Ramsay at [place] 2/5/2020by text page via the [facility] messaging system. Electronically Signed   By: Jeannine Boga M.D.   On: 11/29/2018 03:02     Labs:   Basic Metabolic Panel: Recent Labs  Lab 11/26/18 1521 11/29/18 0332 11/30/18 0515  NA 136 129* 137  K 4.2 3.6 4.5  CL 104 95* 104  CO2 22 21* 24  GLUCOSE 91 106* 90  BUN  10 14 9   CREATININE 0.86 0.85 0.74  CALCIUM 9.1 9.1 8.7*  MG 2.2  --   --    GFR Estimated Creatinine Clearance: 32.9 mL/min  (by C-G formula based on SCr of 0.74 mg/dL). Liver Function Tests: Recent Labs  Lab 11/26/18 1521 11/29/18 0332  AST 32 34  ALT 18 19  ALKPHOS 65 68  BILITOT 0.4 0.9  PROT 7.3 7.8  ALBUMIN 3.5 3.9   Recent Labs  Lab 11/26/18 1521  LIPASE 31   No results for input(s): AMMONIA in the last 168 hours. Coagulation profile Recent Labs  Lab 11/26/18 1521 11/29/18 0332  INR 1.42 1.19    CBC: Recent Labs  Lab 11/26/18 1521 11/29/18 0332  WBC 5.8 10.3  NEUTROABS  --  7.9*  HGB 12.5 13.4  HCT 38.6 41.2  MCV 90.4 90.4  PLT 262 262   Cardiac Enzymes: Recent Labs  Lab 11/26/18 2336 11/27/18 0637 11/27/18 1031  TROPONINI <0.03 <0.03 <0.03   BNP: Invalid input(s): POCBNP CBG: Recent Labs  Lab 11/29/18 0317  GLUCAP 100*   D-Dimer No results for input(s): DDIMER in the last 72 hours. Hgb A1c No results for input(s): HGBA1C in the last 72 hours. Lipid Profile No results for input(s): CHOL, HDL, LDLCALC, TRIG, CHOLHDL, LDLDIRECT in the last 72 hours. Thyroid function studies No results for input(s): TSH, T4TOTAL, T3FREE, THYROIDAB in the last 72 hours.  Invalid input(s): FREET3 Anemia work up No results for input(s): VITAMINB12, FOLATE, FERRITIN, TIBC, IRON, RETICCTPCT in the last 72 hours. Microbiology Recent Results (from the past 240 hour(s))  Urine culture     Status: None   Collection Time: 11/26/18  4:34 PM  Result Value Ref Range Status   Specimen Description URINE, CLEAN CATCH  Final   Special Requests   Final    NONE Performed at [facility], [address], [place], [place] [address]    Culture NO GROWTH  Final   Report Status 11/27/2018 FINAL  Final     Discharge Instructions:   Discharge Instructions    Diet - low sodium heart healthy   Complete by:  As directed    Increase activity slowly   Complete by:  As directed      Allergies as of 11/30/2018      Reactions   Alendronate Sodium Other (See Comments)   Jaw pain    Amoxicillin Other (See Comments)   Nausea, dizziness Nausea, dizziness   Codeine Nausea And Vomiting   Fosamax [alendronate Sodium] Other (See Comments)   Jaw pain   Gluten Meal Other (See Comments)   Celiac Disease Celiac Disease   Hydrochlorothiazide Other (See Comments)   Hyponatremia, GI upset Hyponatremia, GI upset   Tetanus Toxoid, Adsorbed Other (See Comments)   Bad local reaction   Tetanus Toxoids Other (See Comments)   Bad local reaction      Medication List    TAKE these medications   acetaminophen 500 MG tablet Commonly known as:  TYLENOL Take 1,000 mg by mouth every 6 (six) hours as needed.   amiodarone 100 MG tablet Commonly known as:  PACERONE Take 100 mg by mouth daily.   amLODipine 2.5 MG tablet Commonly known as:  NORVASC Take 1 tablet (2.5 mg total) by mouth daily for 30 days.   apixaban 2.5 MG Tabs tablet Commonly known as:  ELIQUIS Take 1 tablet (2.5 mg total) by mouth 2 (two) times daily for 30 days.   aspirin 81 MG  EC tablet Take 1 tablet (81 mg total) by mouth daily for 30 days.   atorvastatin 40 MG tablet Commonly known as:  LIPITOR Take 1 tablet (40 mg total) by mouth daily at 6 PM for 30 days.   BENEFIBER Powd Take 1 Scoop by mouth daily.   furosemide 20 MG tablet Commonly known as:  LASIX Take 1 tablet (20 mg total) by mouth daily as needed for fluid or edema. What changed:    when to take this  reasons to take this   levothyroxine 50 MCG tablet Commonly known as:  SYNTHROID, LEVOTHROID Take 1 tablet (50 mcg total) by mouth daily before breakfast.   Magnesium 250 MG Tabs Take 250 mg by mouth daily.   Melatonin CR 3 MG Tbcr Take 3 mg by mouth at bedtime.   MULTIPLE VITAMIN PO Take 1 tablet by mouth daily.   pantoprazole 40 MG tablet Commonly known as:  PROTONIX Take 40 mg by mouth daily.   traZODone 50 MG tablet Commonly known as:  DESYREL Take 25 mg by mouth at bedtime as needed for sleep.      Follow-up  Information    Stoneking, Hal, MD Follow up in 1 week(s).   Specialty:  Internal Medicine Contact information: [address] [place] [address] [phone]        as previously recommended Neurology follow up Follow up.            Time coordinating discharge: 25 min  Signed:  [facility] 11/30/2018, 4:31 PM

## 2018-11-30 NOTE — Discharge Instructions (Signed)
Use heat/muscle cream for left shoulder/neck region Be sure to take your eliquis consistently If the top number of your blood pressure is >160 consistently (on 2 or more checks)-- call PCP for instructions

## 2018-11-30 NOTE — Care Management Obs Status (Signed)
[facility] NOTIFICATION   Patient Details  Name: CHRISTYANN MANOLIS MRN: [id number] Date of Birth: 16-Mar-1924   Medicare Observation Status Notification Given:  Yes    Midge Minium RN, BSN, NCM-BC, ACM-RN [phone] 11/30/2018, 12:37 PM

## 2018-12-06 DIAGNOSIS — I10 Essential (primary) hypertension: Secondary | ICD-10-CM | POA: Diagnosis not present

## 2018-12-06 DIAGNOSIS — I480 Paroxysmal atrial fibrillation: Secondary | ICD-10-CM | POA: Diagnosis not present

## 2018-12-06 DIAGNOSIS — E611 Iron deficiency: Secondary | ICD-10-CM | POA: Diagnosis not present

## 2018-12-06 DIAGNOSIS — E222 Syndrome of inappropriate secretion of antidiuretic hormone: Secondary | ICD-10-CM | POA: Diagnosis not present

## 2018-12-06 DIAGNOSIS — R200 Anesthesia of skin: Secondary | ICD-10-CM | POA: Diagnosis not present

## 2018-12-06 DIAGNOSIS — F321 Major depressive disorder, single episode, moderate: Secondary | ICD-10-CM | POA: Diagnosis not present

## 2018-12-06 DIAGNOSIS — K900 Celiac disease: Secondary | ICD-10-CM | POA: Diagnosis not present

## 2018-12-11 ENCOUNTER — Encounter: Payer: Self-pay | Admitting: Neurology

## 2018-12-15 DIAGNOSIS — I10 Essential (primary) hypertension: Secondary | ICD-10-CM | POA: Diagnosis not present

## 2018-12-15 DIAGNOSIS — E039 Hypothyroidism, unspecified: Secondary | ICD-10-CM | POA: Diagnosis not present

## 2018-12-15 DIAGNOSIS — I480 Paroxysmal atrial fibrillation: Secondary | ICD-10-CM | POA: Diagnosis not present

## 2018-12-19 ENCOUNTER — Ambulatory Visit (INDEPENDENT_AMBULATORY_CARE_PROVIDER_SITE_OTHER): Payer: Medicare Other | Admitting: Interventional Cardiology

## 2018-12-19 ENCOUNTER — Encounter: Payer: Self-pay | Admitting: Interventional Cardiology

## 2018-12-19 VITALS — BP 132/64 | HR 65 | Ht 61.5 in | Wt 107.0 lb

## 2018-12-19 DIAGNOSIS — I25709 Atherosclerosis of coronary artery bypass graft(s), unspecified, with unspecified angina pectoris: Secondary | ICD-10-CM | POA: Diagnosis not present

## 2018-12-19 DIAGNOSIS — Z79899 Other long term (current) drug therapy: Secondary | ICD-10-CM

## 2018-12-19 DIAGNOSIS — N183 Chronic kidney disease, stage 3 unspecified: Secondary | ICD-10-CM

## 2018-12-19 DIAGNOSIS — I340 Nonrheumatic mitral (valve) insufficiency: Secondary | ICD-10-CM | POA: Diagnosis not present

## 2018-12-19 DIAGNOSIS — Z7901 Long term (current) use of anticoagulants: Secondary | ICD-10-CM | POA: Diagnosis not present

## 2018-12-19 DIAGNOSIS — I480 Paroxysmal atrial fibrillation: Secondary | ICD-10-CM

## 2018-12-19 NOTE — Patient Instructions (Signed)
Medication Instructions:  Your physician recommends that you continue on your current medications as directed. Please refer to the Current Medication list given to you today.  If you need a refill on your cardiac medications before your next appointment, please call your pharmacy.   Lab work: None If you have labs (blood work) drawn today and your tests are completely normal, you will receive your results only by: Marland Kitchen MyChart Message (if you have MyChart) OR . A paper copy in the mail If you have any lab test that is abnormal or we need to change your treatment, we will call you to review the results.  Testing/Procedures: Your physician recommends that you have a Long Term Monitor.  Follow-Up: Your physician recommends that you schedule a follow-up appointment as needed with Dr. [name].    Any Other Special Instructions Will Be Listed Below (If Applicable).

## 2018-12-19 NOTE — Progress Notes (Signed)
Cardiology Office Note:    Date:  12/19/2018   ID:  Maria [name], DOB [birth year], MRN [id number]  PCP:  [name], MD  Cardiologist:  No primary care provider on file.   Referring MD: [name], MD   Chief Complaint  Patient presents with  . Atrial Fibrillation    History of Present Illness:    Maria [name] is a 85 y.o. female with a hx of paroxysmal atrial fibrillation, amiodarone therapy, bilateral carotid stenosis, coronary artery disease with multivessel coronary bypass grafting 2014, chronic kidney disease, chronic anticoagulation with Xarelto, and hypertension.  Recent TIA leading to switch from Xarelto to apixaban 2.5 mg twice daily.  She has had 2 emergency room visits and one admission to the hospital within the past month because of episodes of irregular heartbeat, confusion, and on 1 occasion left arm weakness.  Saw Dr. Felipa Eth within the last 7 days who felt she was in "atrial fibrillation".  She was confused by why if A. fib was present,  he did not admit her to the hospital.  By daughter today.  She says she feels well today.  Episodes that concerned are happening in the middle of the night.  She developed some anxiety if she notices irregularity in heart rhythm she wonders if she could be having an anxiety attack.  She denies chest pain.  Past Medical History:  Diagnosis Date  . Anemia   . Atonic bladder 12/11/2013  . Atrial fibrillation ([place])   . Carotid artery occlusion    right carotid stenosis 40-60%, 4/08, neg for stenosis repeat study 11/11  . Celiac disease   . Chronic anticoagulation CHADS2VASC2 score 7 10/18/2014  . Chronic diastolic CHF (congestive heart failure) (HCC)    takes Lasix daily  . CKD (chronic kidney disease), stage III ([place])   . Coronary artery disease    a.  08/2013 (LIMA-LAD, rSVG to LCX and Lt atrial clip),  . Depression   . DVT (deep venous thrombosis) ([place]) 09/2013  . Gastric ulcer    6-68month ago  . GERD  (gastroesophageal reflux disease)    takes Omeprazole daily  . GI bleeding   . Hemorrhoids   . Hot flashes    takes ZOloft 3 times a week  . Hypertension   . Hypothyroidism    takes Synthroid daily as result of AMiodarone  . Insomnia   . Macular degeneration   . Mild aortic insufficiency    a. Echo 10/2013: mild AI.  .Marland KitchenModerate mitral regurgitation by prior echocardiogram    a. Echo 10/2013.  . Moderate obstructive sleep apnea    uses CPAP;sleep study about 4-535monthago  . MVP (mitral valve prolapse)   . Osteoporosis   . PAF (paroxysmal atrial fibrillation) ([place]  . Peripheral edema    left  . Presbycusis   . Pulmonary hypertension ([place]  . RLS (restless legs syndrome)   . Thyroid nodule    [id number] change 11/11  . Urethral stricture   . Urinary anastomotic stricture   . Urinary retention   . Vitamin D deficiency     Past Surgical History:  Procedure Laterality Date  . APPENDECTOMY    . bilateral cataract surgery    . CARDIAC CATHETERIZATION  08-06-13  . CORONARY ARTERY BYPASS GRAFT N/A 08/28/2013   Procedure: CORONARY ARTERY BYPASS GRAFTING (CABG) x2 using right greater saphenous vein and left internal mammary artery. ;  Surgeon: EdwGrace IsaacD;  Location: [facility]  Service: Open Heart Surgery;  Laterality: N/A;  . HIP ARTHROPLASTY Right 12/09/2013   Procedure: ARTHROPLASTY BIPOLAR HIP CEMENTED;  Surgeon: Kerin Salen, MD;  Location: [facility];  Service: Orthopedics;  Laterality: Right;  . INTRAOPERATIVE TRANSESOPHAGEAL ECHOCARDIOGRAM N/A 08/28/2013   Procedure: INTRAOPERATIVE TRANSESOPHAGEAL ECHOCARDIOGRAM;  Surgeon: Grace Isaac, MD;  Location: [facility];  Service: Open Heart Surgery;  Laterality: N/A;  . LEFT AND RIGHT HEART CATHETERIZATION WITH CORONARY ANGIOGRAM N/A 08/16/2013   Procedure: LEFT AND RIGHT HEART CATHETERIZATION WITH CORONARY ANGIOGRAM;  Surgeon: [name], MD;  Location: [facility] CATH LAB;  Service: Cardiovascular;  Laterality: N/A;  . MANDIBLE  SURGERY    . TCS    . TONSILLECTOMY    . trigger thumb    . TUBAL LIGATION      Current Medications: Current Meds  Medication Sig  . acetaminophen (TYLENOL) 500 MG tablet Take 1,000 mg by mouth every 6 (six) hours as needed.  Marland Kitchen amiodarone (PACERONE) 100 MG tablet Take 100 mg by mouth daily.  Marland Kitchen amLODipine (NORVASC) 2.5 MG tablet Take 1 tablet (2.5 mg total) by mouth daily for 30 days.  Marland Kitchen apixaban (ELIQUIS) 2.5 MG TABS tablet Take 1 tablet (2.5 mg total) by mouth 2 (two) times daily for 30 days.  Marland Kitchen aspirin EC 81 MG EC tablet Take 1 tablet (81 mg total) by mouth daily for 30 days.  . furosemide (LASIX) 20 MG tablet Take 1 tablet (20 mg total) by mouth daily as needed for fluid or edema.  Marland Kitchen levothyroxine (SYNTHROID, LEVOTHROID) 50 MCG tablet Take 1 tablet (50 mcg total) by mouth daily before breakfast.  . Magnesium 250 MG TABS Take 250 mg by mouth daily.  . Melatonin CR 3 MG TBCR Take 3 mg by mouth at bedtime.  . MULTIPLE VITAMIN PO Take 1 tablet by mouth daily.  . pantoprazole (PROTONIX) 40 MG tablet Take 40 mg by mouth daily.  . Wheat Dextrin (BENEFIBER) POWD Take 1 Scoop by mouth daily.     Allergies:   Alendronate sodium; Amoxicillin; Codeine; Fosamax [alendronate sodium]; Gluten meal; Hydrochlorothiazide; Tetanus toxoid, adsorbed; and Tetanus toxoids   Social History   Socioeconomic History  . Marital status: Widowed    Spouse name: Not on file  . Number of children: Not on file  . Years of education: Not on file  . Highest education level: Not on file  Occupational History  . Not on file  Social Needs  . Financial resource strain: Not on file  . Food insecurity:    Worry: Not on file    Inability: Not on file  . Transportation needs:    Medical: Not on file    Non-medical: Not on file  Tobacco Use  . Smoking status: Never Smoker  . Smokeless tobacco: Never Used  Substance and Sexual Activity  . Alcohol use: No  . Drug use: No  . Sexual activity: Yes    Birth  control/protection: Surgical  Lifestyle  . Physical activity:    Days per week: Not on file    Minutes per session: Not on file  . Stress: Not on file  Relationships  . Social connections:    Talks on phone: Not on file    Gets together: Not on file    Attends religious service: Not on file    Active member of club or organization: Not on file    Attends meetings of clubs or organizations: Not on file    Relationship status: Not  on file  Other Topics Concern  . Not on file  Social History Narrative  . Not on file     Family History: The patient's family history includes AAA (abdominal aortic aneurysm) in her brother; CAD in her father; CVA in her mother; Colon cancer in her father; Heart attack in her brother, father, and mother; Peripheral vascular disease in her brother.  ROS:   Please see the history of present illness.    Dizziness, visual disturbance, hearing loss, chills, constipation, anxiety, difficulty with balance, bleeding.  All other systems reviewed and are negative.  EKGs/Labs/Other Studies Reviewed:    The following studies were reviewed today: We will attempt to get the EKG recently performed at Dr. Carlyle Lipa office.  EKG:  EKG not repeated today.  Recent Labs: 09/11/2018: TSH 2.200 11/26/2018: B Natriuretic Peptide 163.1; Magnesium 2.2 11/29/2018: ALT 19; Hemoglobin 13.4; Platelets 262 11/30/2018: BUN 9; Creatinine, Ser 0.74; Potassium 4.5; Sodium 137  Recent Lipid Panel    Component Value Date/Time   CHOL 223 (H) 11/27/2018 0637   TRIG 93 11/27/2018 0637   HDL 74 11/27/2018 0637   CHOLHDL 3.0 11/27/2018 0637   VLDL 19 11/27/2018 0637   LDLCALC 130 (H) 11/27/2018 0637    Physical Exam:    VS:  BP 132/64   Pulse 65   Ht 5' 1.5" (1.562 m)   Wt 107 lb (48.5 kg)   SpO2 99%   BMI 19.89 kg/m     Wt Readings from Last 3 Encounters:  12/19/18 107 lb (48.5 kg)  11/29/18 102 lb 8.2 oz (46.5 kg)  11/26/18 108 lb 7.5 oz (49.2 kg)     GEN: Elderly and  somewhat frail appearing. No acute distress HEENT: Normal NECK: No JVD. LYMPHATICS: No lymphadenopathy CARDIAC: IIRR.  2/6 systolic right upper sternal border murmur, no gallop, no edema VASCULAR: 2+ bilateral radial pulses, no bruits RESPIRATORY:  Clear to auscultation without rales, wheezing or rhonchi  ABDOMEN: Soft, non-tender, non-distended, No pulsatile mass, MUSCULOSKELETAL: No deformity  SKIN: Warm and dry NEUROLOGIC:  Alert and oriented x 3 PSYCHIATRIC:  Normal affect   ASSESSMENT:    1. Paroxysmal atrial fibrillation (HCC)   2. Chronic anticoagulation CHADS2VASC2 score 7   3. Coronary artery disease involving coronary bypass graft of native heart with angina pectoris ([place])   4. On amiodarone therapy   5. Nonrheumatic mitral valve regurgitation   6. CKD (chronic kidney disease), stage III (HCC)    PLAN:    In order of problems listed above:  1. 2-week ZIO patch to exclude paroxysmal atrial fibrillation 2. Continue apixaban 2.5 mg twice daily based on age greater than 26 and weight. 3. No symptoms suggest angina. 4. If A. fib is identified and correlates with symptoms, we may consider increasing amiodarone to 200 mg/day.  Clinical observation.  Further management changes dependent upon findings from Dr. Carlyle Lipa EKG and from 2-week monitor.   Medication Adjustments/Labs and Tests Ordered: Current medicines are reviewed at length with the patient today.  Concerns regarding medicines are outlined above.  Orders Placed This Encounter  Procedures  . LONG TERM MONITOR (3-14 DAYS)   No orders of the defined types were placed in this encounter.   Patient Instructions  Medication Instructions:  Your physician recommends that you continue on your current medications as directed. Please refer to the Current Medication list given to you today.  If you need a refill on your cardiac medications before your next appointment, please call your pharmacy.  Lab  work: None If you have labs (blood work) drawn today and your tests are completely normal, you will receive your results only by: Marland Kitchen MyChart Message (if you have MyChart) OR . A paper copy in the mail If you have any lab test that is abnormal or we need to change your treatment, we will call you to review the results.  Testing/Procedures: Your physician recommends that you have a Long Term Monitor.  Follow-Up: Your physician recommends that you schedule a follow-up appointment as needed with Dr. [name].    Any Other Special Instructions Will Be Listed Below (If Applicable).       Signed, [name], MD  12/19/2018 5:32 PM    [place]

## 2018-12-21 ENCOUNTER — Ambulatory Visit (INDEPENDENT_AMBULATORY_CARE_PROVIDER_SITE_OTHER): Payer: Medicare Other

## 2018-12-21 DIAGNOSIS — I480 Paroxysmal atrial fibrillation: Secondary | ICD-10-CM

## 2019-01-10 DIAGNOSIS — I480 Paroxysmal atrial fibrillation: Secondary | ICD-10-CM | POA: Diagnosis not present

## 2019-01-19 ENCOUNTER — Telehealth: Payer: Self-pay | Admitting: *Deleted

## 2019-01-19 MED ORDER — AMIODARONE HCL 200 MG PO TABS: 200.0000 mg | ORAL_TABLET | Freq: Every day | ORAL | 3 refills | Status: DC

## 2019-01-19 NOTE — Telephone Encounter (Signed)
-----   Message from Belva Crome, MD sent at 01/18/2019  1:54 PM EDT ----- Let the patient know she had atrial fibrillation for 45% of the time during the monitor.  Increase amiodarone to 200 mg daily. A copy will be sent to [name], MD

## 2019-01-19 NOTE — Telephone Encounter (Signed)
Spoke with pt and went over results and recommendations per Dr. [name].  Pt verbalized understanding and was in agreement with this plan.

## 2019-06-25 DIAGNOSIS — I10 Essential (primary) hypertension: Secondary | ICD-10-CM | POA: Diagnosis not present

## 2019-06-25 DIAGNOSIS — I480 Paroxysmal atrial fibrillation: Secondary | ICD-10-CM | POA: Diagnosis not present

## 2019-06-25 DIAGNOSIS — E039 Hypothyroidism, unspecified: Secondary | ICD-10-CM | POA: Diagnosis not present

## 2019-06-25 DIAGNOSIS — M810 Age-related osteoporosis without current pathological fracture: Secondary | ICD-10-CM | POA: Diagnosis not present

## 2019-06-25 DIAGNOSIS — J449 Chronic obstructive pulmonary disease, unspecified: Secondary | ICD-10-CM | POA: Diagnosis not present

## 2019-06-25 DIAGNOSIS — F321 Major depressive disorder, single episode, moderate: Secondary | ICD-10-CM | POA: Diagnosis not present

## 2019-06-25 DIAGNOSIS — D509 Iron deficiency anemia, unspecified: Secondary | ICD-10-CM | POA: Diagnosis not present

## 2019-07-31 DIAGNOSIS — E039 Hypothyroidism, unspecified: Secondary | ICD-10-CM | POA: Diagnosis not present

## 2019-07-31 DIAGNOSIS — F321 Major depressive disorder, single episode, moderate: Secondary | ICD-10-CM | POA: Diagnosis not present

## 2019-07-31 DIAGNOSIS — J449 Chronic obstructive pulmonary disease, unspecified: Secondary | ICD-10-CM | POA: Diagnosis not present

## 2019-07-31 DIAGNOSIS — D509 Iron deficiency anemia, unspecified: Secondary | ICD-10-CM | POA: Diagnosis not present

## 2019-07-31 DIAGNOSIS — I480 Paroxysmal atrial fibrillation: Secondary | ICD-10-CM | POA: Diagnosis not present

## 2019-07-31 DIAGNOSIS — M810 Age-related osteoporosis without current pathological fracture: Secondary | ICD-10-CM | POA: Diagnosis not present

## 2019-07-31 DIAGNOSIS — I10 Essential (primary) hypertension: Secondary | ICD-10-CM | POA: Diagnosis not present

## 2019-10-11 DIAGNOSIS — I480 Paroxysmal atrial fibrillation: Secondary | ICD-10-CM | POA: Diagnosis not present

## 2019-10-11 DIAGNOSIS — M810 Age-related osteoporosis without current pathological fracture: Secondary | ICD-10-CM | POA: Diagnosis not present

## 2019-10-11 DIAGNOSIS — F321 Major depressive disorder, single episode, moderate: Secondary | ICD-10-CM | POA: Diagnosis not present

## 2019-10-11 DIAGNOSIS — D509 Iron deficiency anemia, unspecified: Secondary | ICD-10-CM | POA: Diagnosis not present

## 2019-10-11 DIAGNOSIS — I10 Essential (primary) hypertension: Secondary | ICD-10-CM | POA: Diagnosis not present

## 2019-10-11 DIAGNOSIS — E039 Hypothyroidism, unspecified: Secondary | ICD-10-CM | POA: Diagnosis not present

## 2019-10-11 DIAGNOSIS — J449 Chronic obstructive pulmonary disease, unspecified: Secondary | ICD-10-CM | POA: Diagnosis not present

## 2019-11-09 DIAGNOSIS — Z23 Encounter for immunization: Secondary | ICD-10-CM | POA: Diagnosis not present

## 2019-11-30 DIAGNOSIS — L821 Other seborrheic keratosis: Secondary | ICD-10-CM | POA: Diagnosis not present

## 2019-11-30 DIAGNOSIS — L720 Epidermal cyst: Secondary | ICD-10-CM | POA: Diagnosis not present

## 2019-11-30 DIAGNOSIS — L84 Corns and callosities: Secondary | ICD-10-CM | POA: Diagnosis not present

## 2019-11-30 DIAGNOSIS — L814 Other melanin hyperpigmentation: Secondary | ICD-10-CM | POA: Diagnosis not present

## 2019-12-04 DIAGNOSIS — Z23 Encounter for immunization: Secondary | ICD-10-CM | POA: Diagnosis not present

## 2019-12-12 DIAGNOSIS — I10 Essential (primary) hypertension: Secondary | ICD-10-CM | POA: Diagnosis not present

## 2019-12-12 DIAGNOSIS — D509 Iron deficiency anemia, unspecified: Secondary | ICD-10-CM | POA: Diagnosis not present

## 2019-12-12 DIAGNOSIS — F321 Major depressive disorder, single episode, moderate: Secondary | ICD-10-CM | POA: Diagnosis not present

## 2019-12-12 DIAGNOSIS — E039 Hypothyroidism, unspecified: Secondary | ICD-10-CM | POA: Diagnosis not present

## 2019-12-12 DIAGNOSIS — J449 Chronic obstructive pulmonary disease, unspecified: Secondary | ICD-10-CM | POA: Diagnosis not present

## 2019-12-12 DIAGNOSIS — M810 Age-related osteoporosis without current pathological fracture: Secondary | ICD-10-CM | POA: Diagnosis not present

## 2019-12-12 DIAGNOSIS — I480 Paroxysmal atrial fibrillation: Secondary | ICD-10-CM | POA: Diagnosis not present

## 2020-01-14 DIAGNOSIS — F321 Major depressive disorder, single episode, moderate: Secondary | ICD-10-CM | POA: Diagnosis not present

## 2020-01-14 DIAGNOSIS — E039 Hypothyroidism, unspecified: Secondary | ICD-10-CM | POA: Diagnosis not present

## 2020-01-14 DIAGNOSIS — M810 Age-related osteoporosis without current pathological fracture: Secondary | ICD-10-CM | POA: Diagnosis not present

## 2020-01-14 DIAGNOSIS — J449 Chronic obstructive pulmonary disease, unspecified: Secondary | ICD-10-CM | POA: Diagnosis not present

## 2020-01-14 DIAGNOSIS — I480 Paroxysmal atrial fibrillation: Secondary | ICD-10-CM | POA: Diagnosis not present

## 2020-01-14 DIAGNOSIS — I10 Essential (primary) hypertension: Secondary | ICD-10-CM | POA: Diagnosis not present

## 2020-01-14 DIAGNOSIS — D509 Iron deficiency anemia, unspecified: Secondary | ICD-10-CM | POA: Diagnosis not present

## 2020-02-01 DIAGNOSIS — L814 Other melanin hyperpigmentation: Secondary | ICD-10-CM | POA: Diagnosis not present

## 2020-02-01 DIAGNOSIS — L820 Inflamed seborrheic keratosis: Secondary | ICD-10-CM | POA: Diagnosis not present

## 2020-02-01 DIAGNOSIS — L720 Epidermal cyst: Secondary | ICD-10-CM | POA: Diagnosis not present

## 2020-02-16 DIAGNOSIS — E039 Hypothyroidism, unspecified: Secondary | ICD-10-CM | POA: Diagnosis not present

## 2020-02-16 DIAGNOSIS — I10 Essential (primary) hypertension: Secondary | ICD-10-CM | POA: Diagnosis not present

## 2020-02-16 DIAGNOSIS — M810 Age-related osteoporosis without current pathological fracture: Secondary | ICD-10-CM | POA: Diagnosis not present

## 2020-02-16 DIAGNOSIS — D509 Iron deficiency anemia, unspecified: Secondary | ICD-10-CM | POA: Diagnosis not present

## 2020-02-16 DIAGNOSIS — I480 Paroxysmal atrial fibrillation: Secondary | ICD-10-CM | POA: Diagnosis not present

## 2020-02-16 DIAGNOSIS — F321 Major depressive disorder, single episode, moderate: Secondary | ICD-10-CM | POA: Diagnosis not present

## 2020-02-16 DIAGNOSIS — J449 Chronic obstructive pulmonary disease, unspecified: Secondary | ICD-10-CM | POA: Diagnosis not present

## 2020-02-27 DIAGNOSIS — G3184 Mild cognitive impairment, so stated: Secondary | ICD-10-CM | POA: Diagnosis not present

## 2020-02-27 DIAGNOSIS — J449 Chronic obstructive pulmonary disease, unspecified: Secondary | ICD-10-CM | POA: Diagnosis not present

## 2020-02-27 DIAGNOSIS — I480 Paroxysmal atrial fibrillation: Secondary | ICD-10-CM | POA: Diagnosis not present

## 2020-02-27 DIAGNOSIS — E039 Hypothyroidism, unspecified: Secondary | ICD-10-CM | POA: Diagnosis not present

## 2020-02-27 DIAGNOSIS — G609 Hereditary and idiopathic neuropathy, unspecified: Secondary | ICD-10-CM | POA: Diagnosis not present

## 2020-02-27 DIAGNOSIS — E222 Syndrome of inappropriate secretion of antidiuretic hormone: Secondary | ICD-10-CM | POA: Diagnosis not present

## 2020-02-27 DIAGNOSIS — Z79899 Other long term (current) drug therapy: Secondary | ICD-10-CM | POA: Diagnosis not present

## 2020-02-27 DIAGNOSIS — K900 Celiac disease: Secondary | ICD-10-CM | POA: Diagnosis not present

## 2020-02-27 DIAGNOSIS — I10 Essential (primary) hypertension: Secondary | ICD-10-CM | POA: Diagnosis not present

## 2020-02-27 DIAGNOSIS — E611 Iron deficiency: Secondary | ICD-10-CM | POA: Diagnosis not present

## 2020-02-27 DIAGNOSIS — I700 Atherosclerosis of aorta: Secondary | ICD-10-CM | POA: Diagnosis not present

## 2020-02-27 DIAGNOSIS — D6869 Other thrombophilia: Secondary | ICD-10-CM | POA: Diagnosis not present

## 2020-02-27 IMAGING — CT CT ANGIO HEAD
3 of 12 series · 14 of 47 positions shown · IV contrast (iopamidol)
Comparison: Chest CTA today reported separately.

CLINICAL DATA: [AGE] female with headache, dizziness,
vertical diplopia, chest pain, back pain, hypertensive.

EXAM:
CT ANGIOGRAPHY HEAD AND NECK
TECHNIQUE: Multidetector CT imaging of the head and neck was performed using
the standard protocol during bolus administration of intravenous
contrast. Multiplanar CT image reconstructions and MIPs were
obtained to evaluate the vascular anatomy. Carotid stenosis
measurements (when applicable) are obtained utilizing NASCET
criteria, using the distal internal carotid diameter as the
denominator.
CONTRAST:  80mL [id number] IOPAMIDOL (CL5N7I-[id number]) INJECTION 76%

[Series 11: sag thins · sagittal · 0.57mm/px · 3 of 176 slices shown]
[im 44/176  brain]
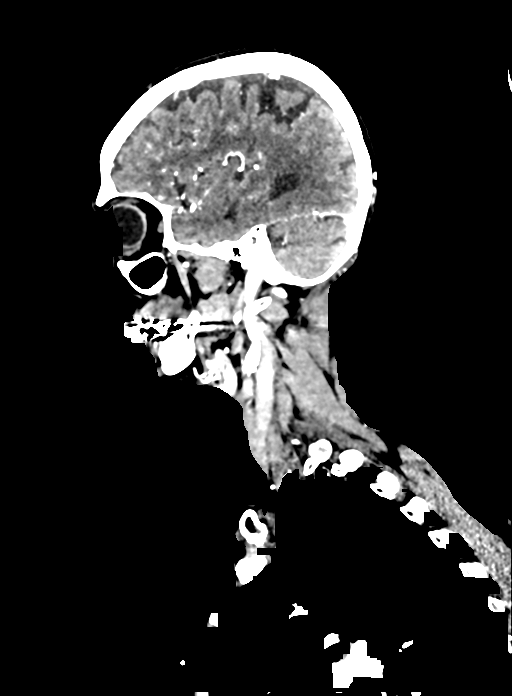
[im 88/176  brain]
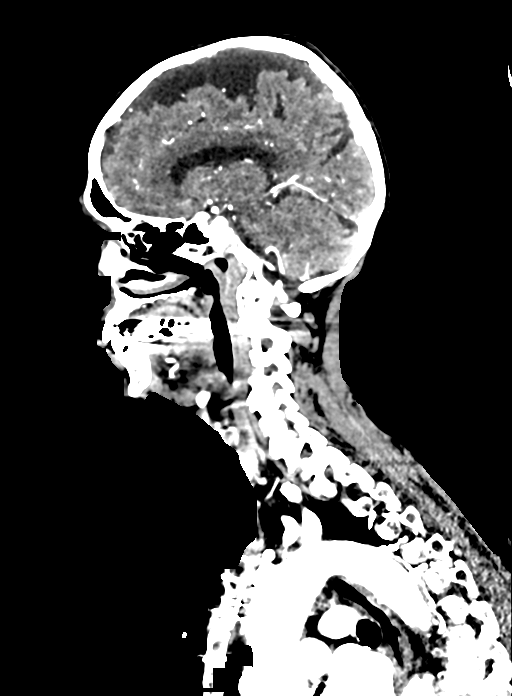
[im 132/176  brain]
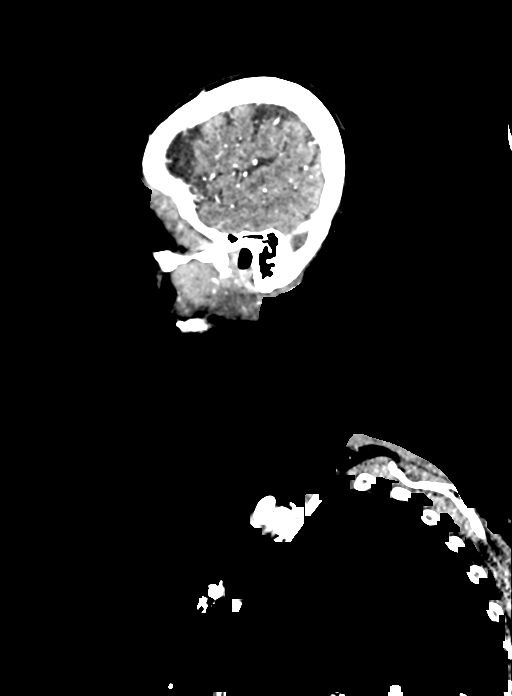

[Series 12: cor thins · coronal · 0.51mm/px · 3 of 207 slices shown]
[im 69/207  brain]
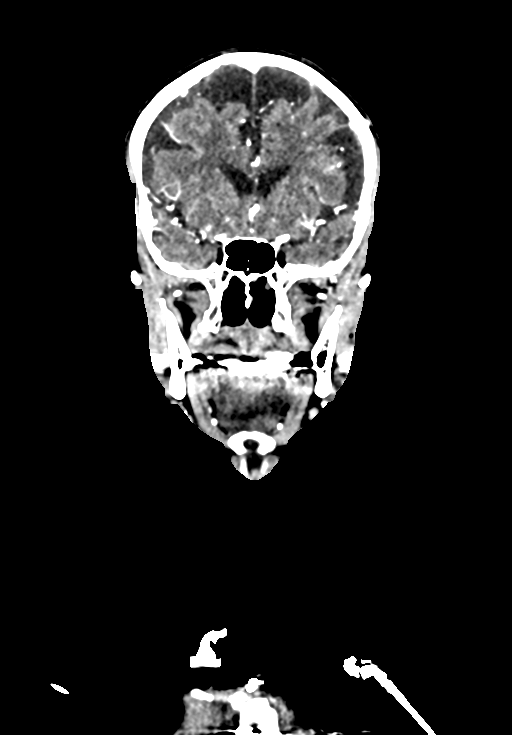
[im 104/207  brain]
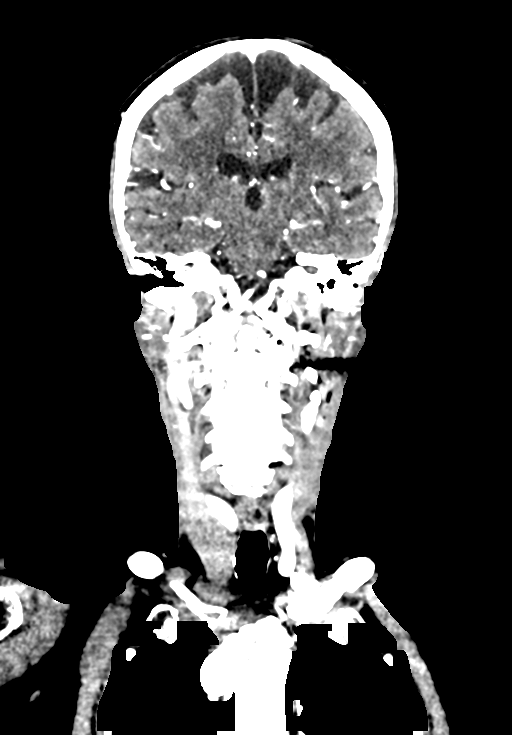
[im 138/207  brain]
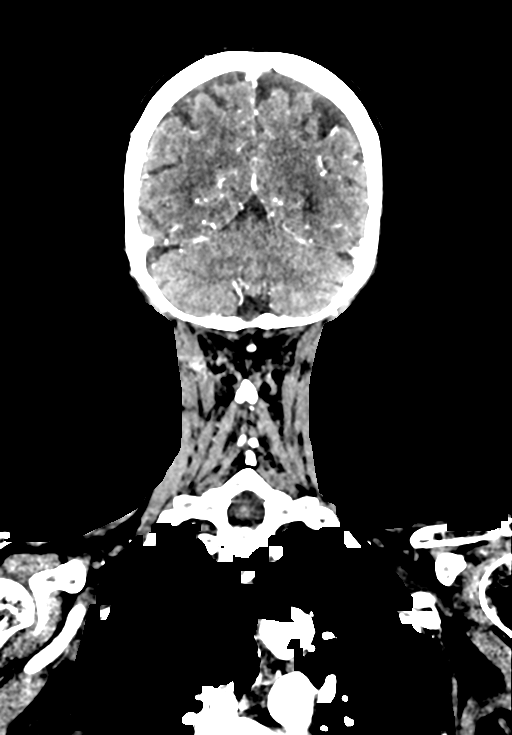

[Series 13: ax thins · axial · 0.43mm/px · z∈[+1164,+1465]mm · 8 of 353 slices shown]
[im 28/353  brain]
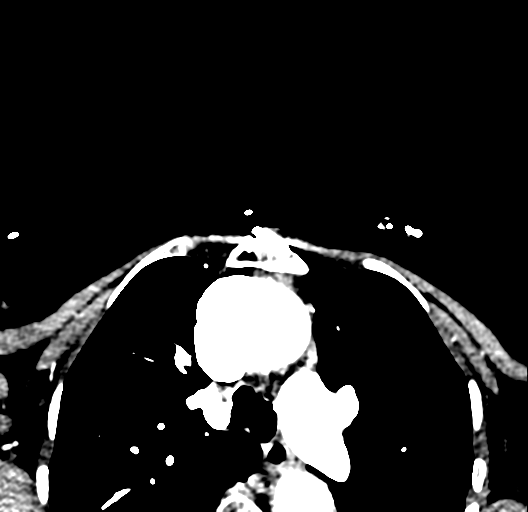
[im 82/353  bone]
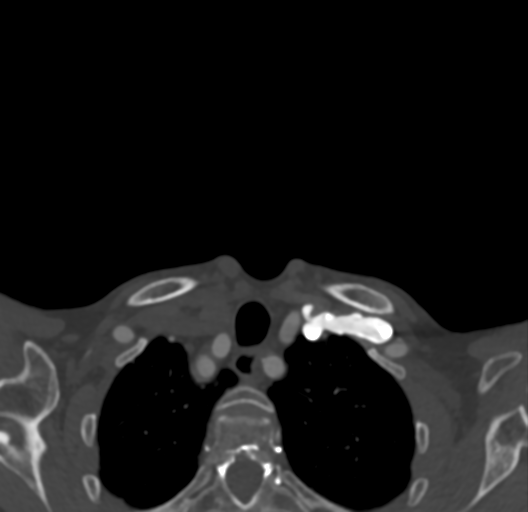
[im 109/353  brain]
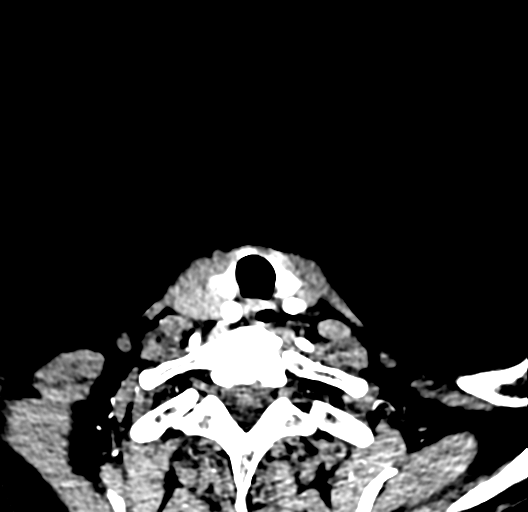
[im 163/353  bone]
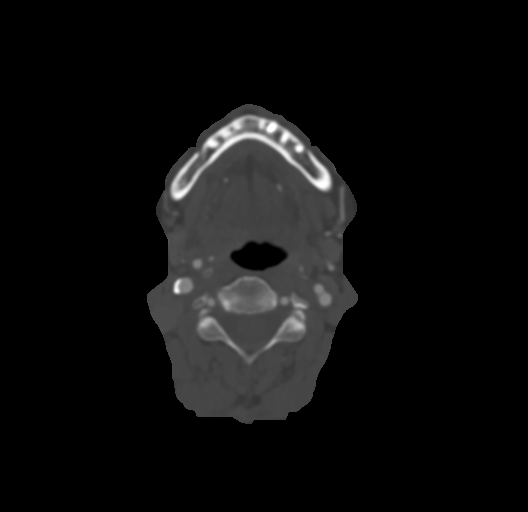
[im 190/353  brain]
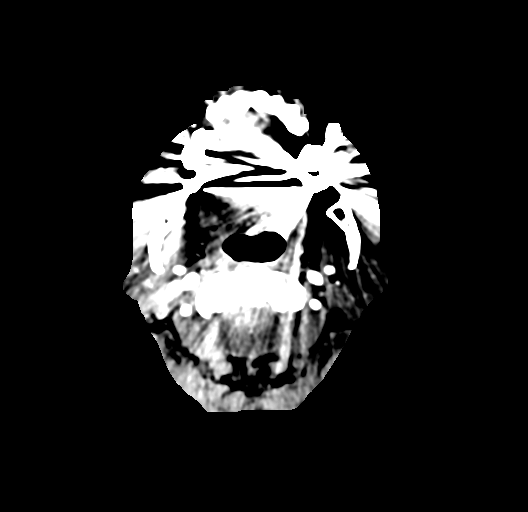
[im 244/353  bone]
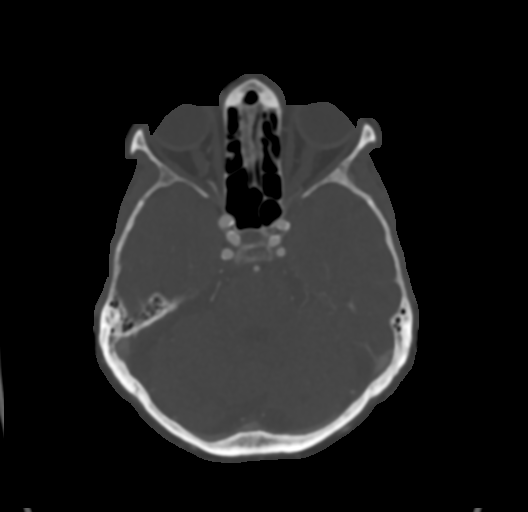
[im 271/353  brain]
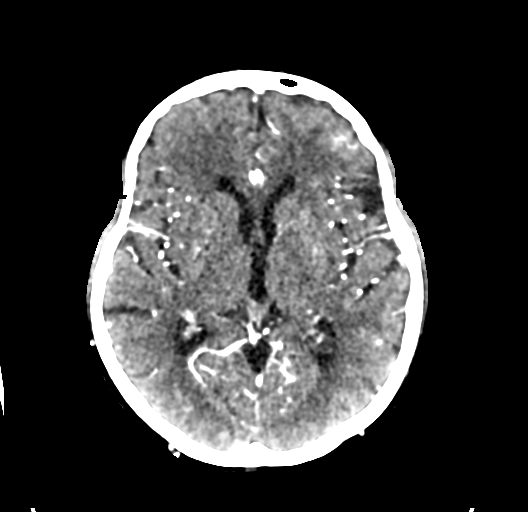
[im 325/353  bone]
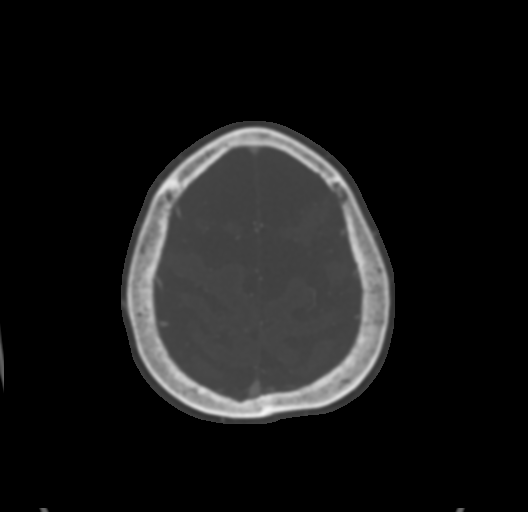

[14 of 47 positions shown; findings below may reference images not displayed]

Head face and
cervical spine CTs 06/16/2016 and 03/02/2016. Brain MRI 01/16/2016.
FINDINGS: CT HEAD

Brain: Stable cerebral volume with no intracranial mass effect. No
acute intracranial hemorrhage identified. No ventriculomegaly. Small
chronic lacune in the left thalamus is stable. Elsewhere stable and
normal for age gray-white matter differentiation. No cortically
based acute infarct identified.

Calvarium and skull base: Osteopenia. No acute osseous abnormality
identified.

Paranasal sinuses: Visualized paranasal sinuses and mastoids are
stable and well pneumatized. Tympanic cavities remain clear.

Orbits: No acute orbit or scalp soft tissue finding.

CTA NECK

Skeleton: Prior sternotomy. Osteopenia. No acute osseous abnormality
in the head or neck.

Upper chest: Chest CTA reported separately today.

Other neck: Negative; no neck mass or lymphadenopathy.

Aortic arch: Calcified aortic atherosclerosis. Three vessel arch
configuration.

Right carotid system: Mildly tortuous brachiocephalic artery and
proximal right CCA without stenosis. Minimal right CCA plaque
without stenosis proximal to the bifurcation. Calcified plaque at
the posterior right ICA origin and bulb without stenosis. Negative
cervical right ICA distal to the bulb.

Left carotid system: Normal left CCA origin. Mildly tortuous
proximal left CCA. No plaque proximal to the bifurcation. Soft and
calcified plaque at the left ICA origin and bulb resulting in less
than 50 % stenosis with respect to the distal vessel. Otherwise
minimal cervical left ICA mural irregularity without stenosis to the
skull base.

Vertebral arteries:
Soft and calcified plaque in the proximal right subclavian artery
resulting in 65% stenosis proximal to the right vertebral artery
origin. Calcified plaque about the right vertebral origin but no
associated proximal right vertebral stenosis. Patent right vertebral
artery to the skull base with mild tortuosity, no stenosis.

Mild proximal left subclavian artery plaque without stenosis.
Calcified plaque near the left vertebral artery origin but no origin
stenosis. Tortuous left V1 segment. Patent left vertebral artery to
the skull base without stenosis.

CTA HEAD

Posterior circulation: Bulky calcified plaque in the right V4
segment with moderate to severe stenosis (series 13, image 144).
This is proximal to the right PICA origin which remains patent. No
other right vertebral stenosis to the vertebrobasilar junction.
Contralateral proximal left V4 calcified plaque with only mild
stenosis. Normal left PICA origin and vertebrobasilar junction.

Patent basilar artery without stenosis. Normal SCA and left PCA
origins. Fetal type right PCA origin. Bilateral PCA branches are
within normal limits.

Anterior circulation:  Both ICA siphons are patent.

On the left there is moderate calcified plaque through the cavernous
segment and bulky supraclinoid calcified plaque with severe stenosis
just distal to the left ophthalmic artery origin (series 13, image
105). The left carotid terminus remains patent.

On the right there is mild to moderate calcified plaque without
stenosis. Normal right ophthalmic and posterior communicating artery
origins.

Patent carotid termini. Normal MCA and ACA origins. Dominant right
A1 segment. Anterior communicating artery and bilateral ACA branches
are within normal limits. Left MCA M1 segment, bifurcation, and left
MCA branches are within normal limits. Right MCA M1 segment,
bifurcation, and right MCA branches are within normal limits.

Venous sinuses: Patent on the delayed images.

Anatomic variants: Fetal type right PCA origin. Dominant right ACA
A1 segment.

Delayed phase: No abnormal enhancement identified.

Review of the MIP images confirms the above findings
IMPRESSION: 1. Negative for large vessel occlusion or arterial dissection in the
head or neck.
2. Positive for Severe stenoses of the distal Left ICA Siphon and
the distal Right Vertebral Artery due to bulky calcified plaque.
3. Bulky plaque also in the proximal Right Subclavian Artery with
65% stenosis. Cervical carotid atherosclerosis without stenosis.
4. Stable CT appearance of the brain since 6354 with no acute
intracranial abnormality.
5. CTA chest today reported separately.

## 2020-02-27 IMAGING — CT CT ANGIO CHEST
4 of 7 series · 19 of 36 positions shown · IV contrast (APPLIED)
Comparison: 11/26/2018 chest radiograph and prior studies

CLINICAL DATA: [AGE] female with acute chest pain

EXAM:
CT ANGIOGRAPHY CHEST WITH CONTRAST
TECHNIQUE: Multidetector CT imaging of the chest was performed using the
standard protocol during bolus administration of intravenous
contrast. Multiplanar CT image reconstructions and MIPs were
obtained to evaluate the vascular anatomy.
CONTRAST:  80mL [id number] IOPAMIDOL ([id number]-JAU) INJECTION 76%

[Series 4: chest wo · axial · 0.49mm/px · z∈[+1022,+1232]mm · 8 of 135 slices shown]
[im 15/135  lung]
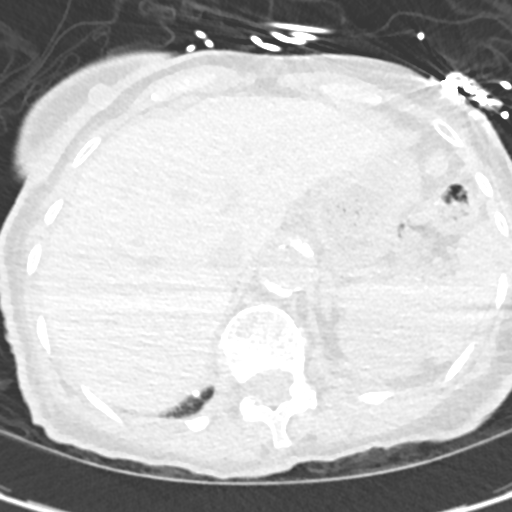
[im 30/135  mediastinal]
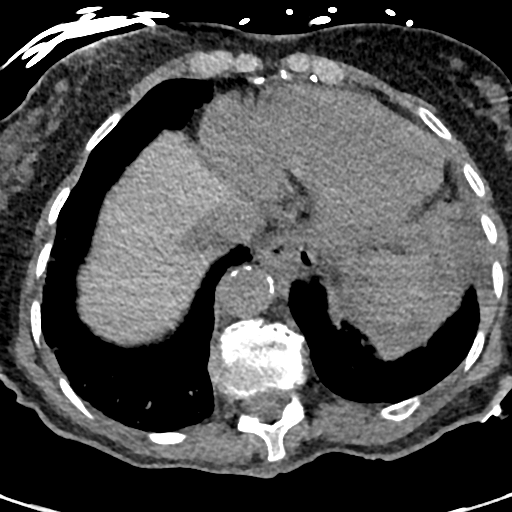
[im 45/135  lung]
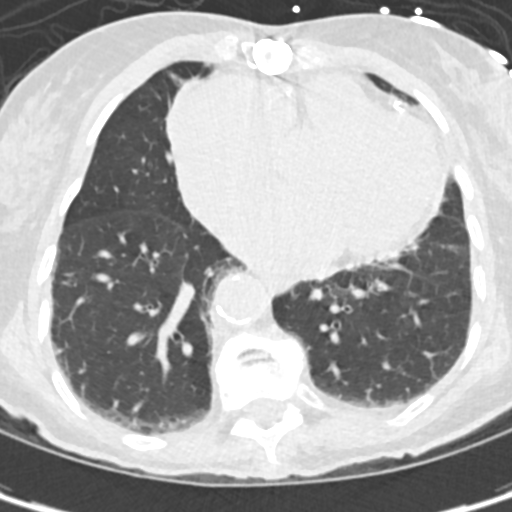
[im 60/135  mediastinal]
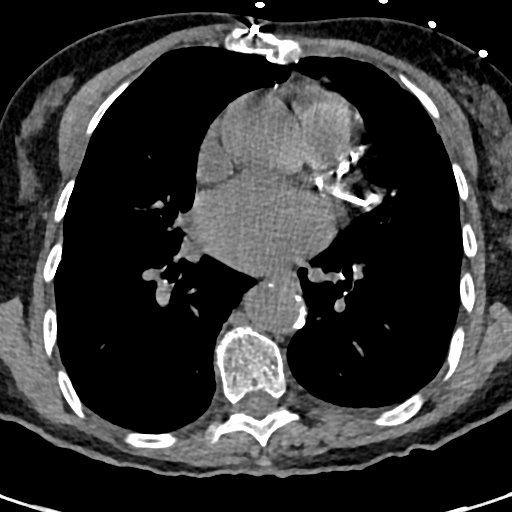
[im 75/135  lung]
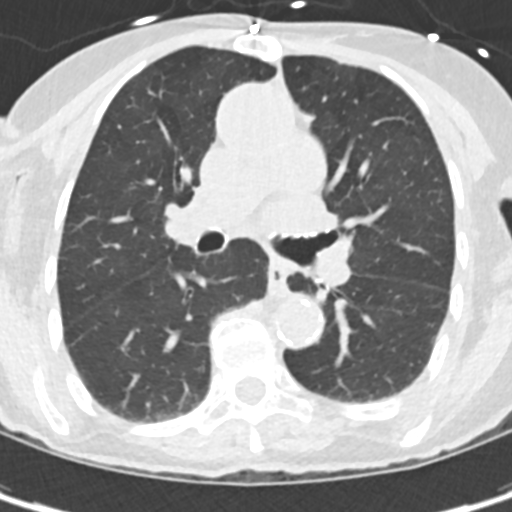
[im 90/135  mediastinal]
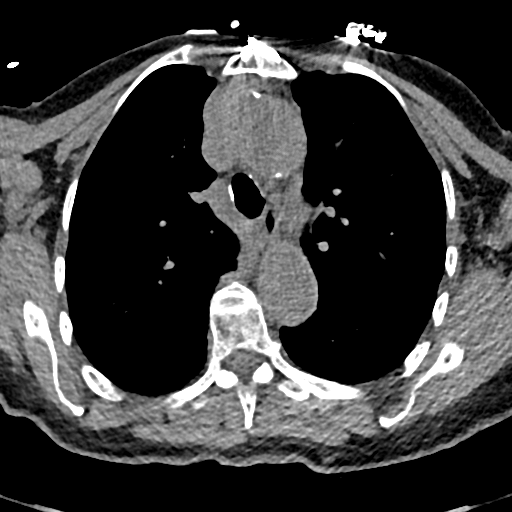
[im 105/135  lung]
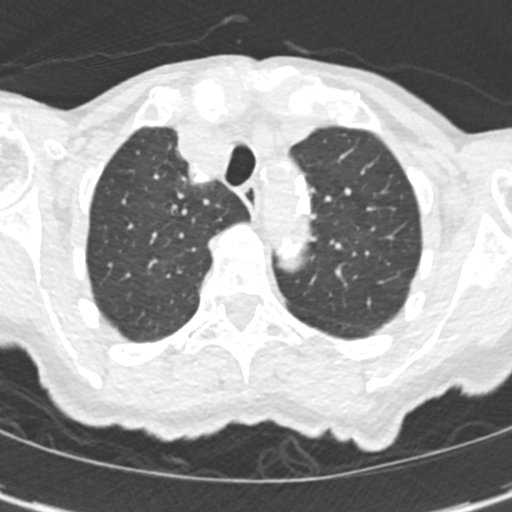
[im 120/135  mediastinal]
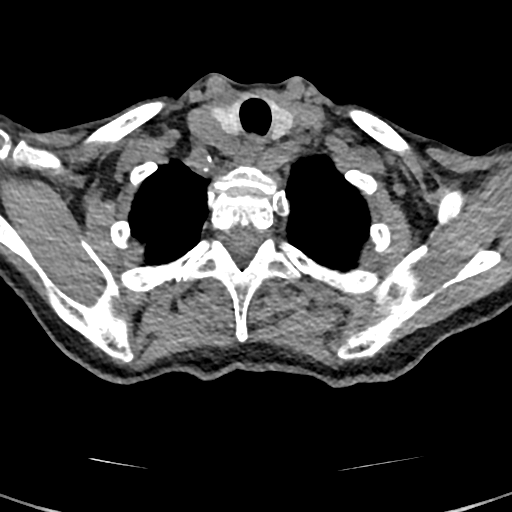

[Series 5: cta chest · axial · 0.52mm/px · z∈[+1028,+1232]mm · 7 of 137 slices shown]
[im 18/137  lung]
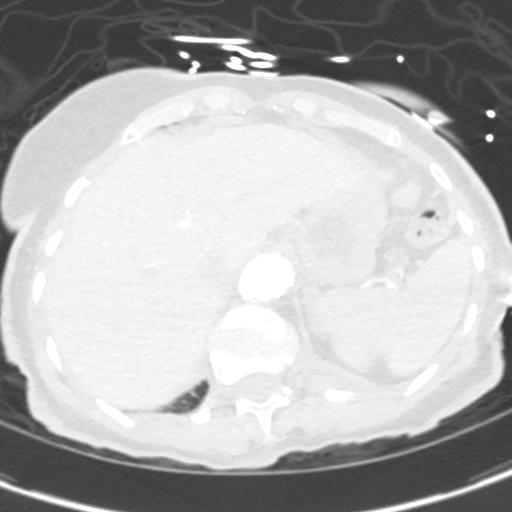
[im 35/137  lung]
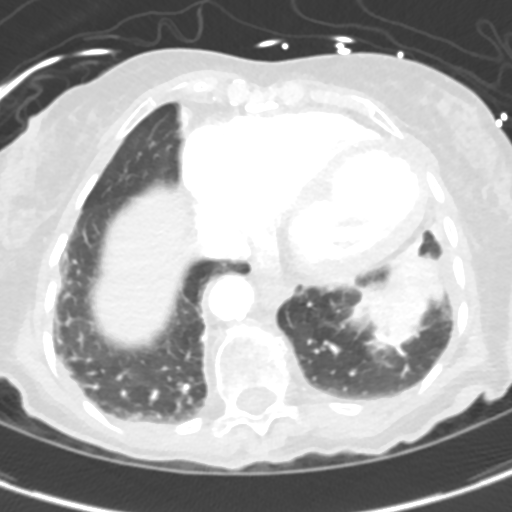
[im 52/137  lung]
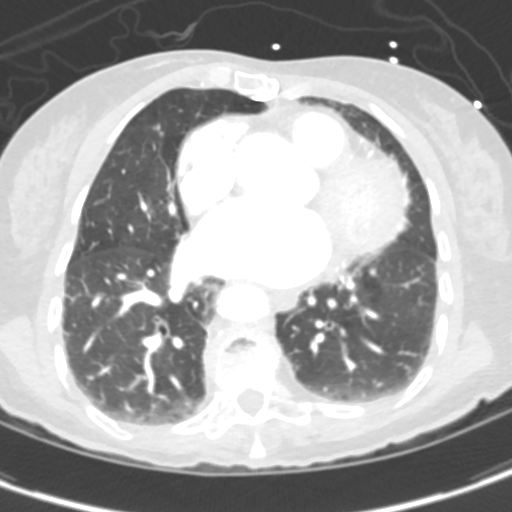
[im 69/137  lung]
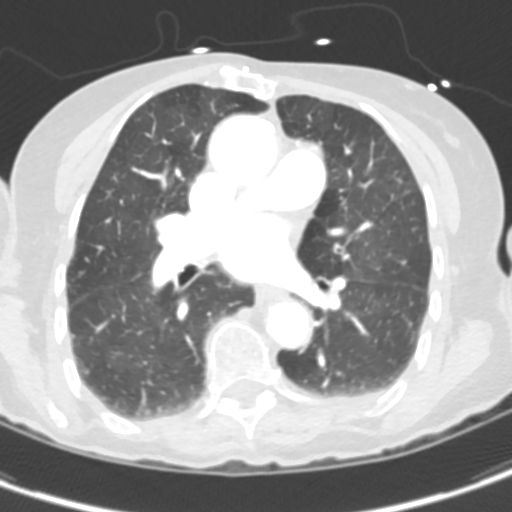
[im 86/137  lung]
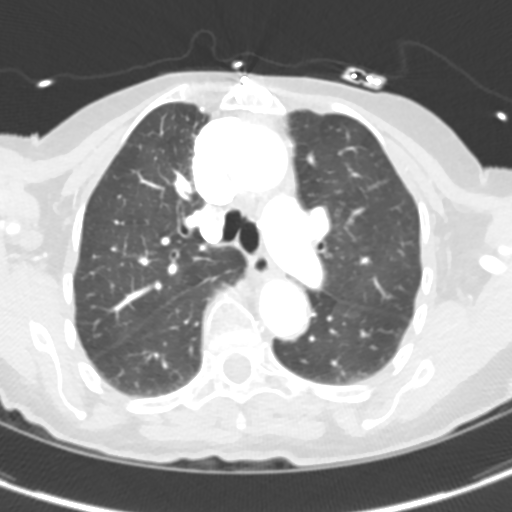
[im 103/137  lung]
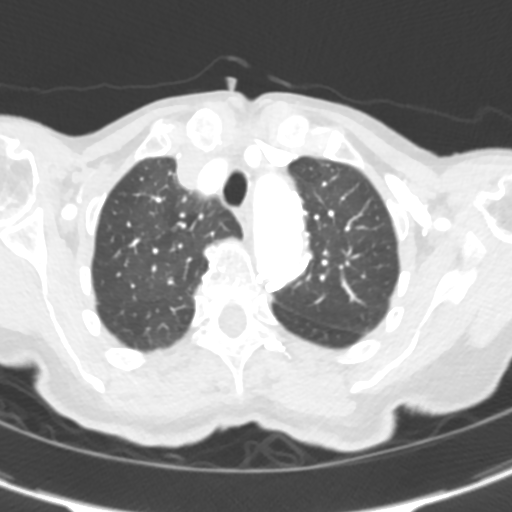
[im 120/137  lung]
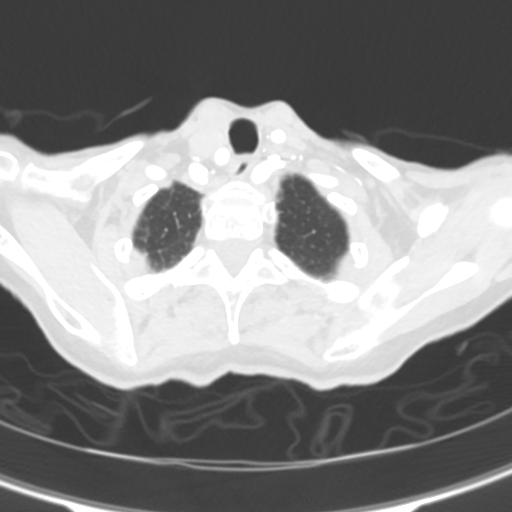

[Series 6: lung · axial · 0.52mm/px · z∈[+1028,+1096]mm · 3 of 137 slices shown]
[im 18/137  mediastinal]
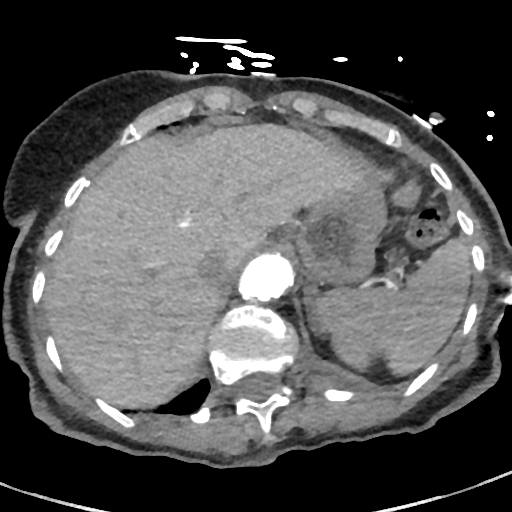
[im 35/137  mediastinal]
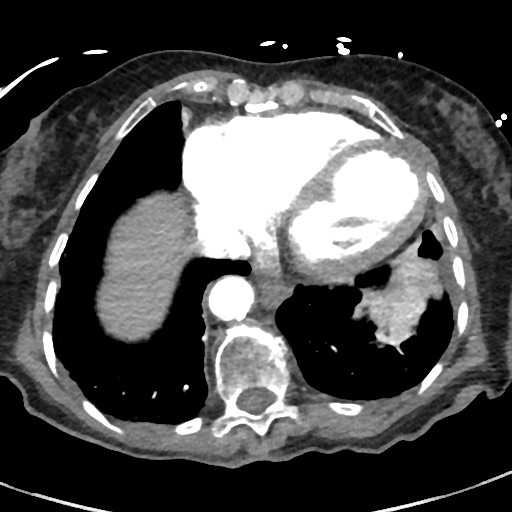
[im 52/137  mediastinal]
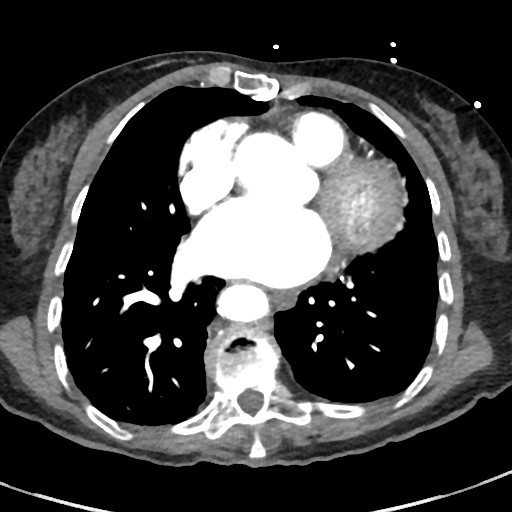

[Series 7: cor mpr · coronal · 0.55mm/px · 1 of 104 slices shown]
[im 52/104  mediastinal]
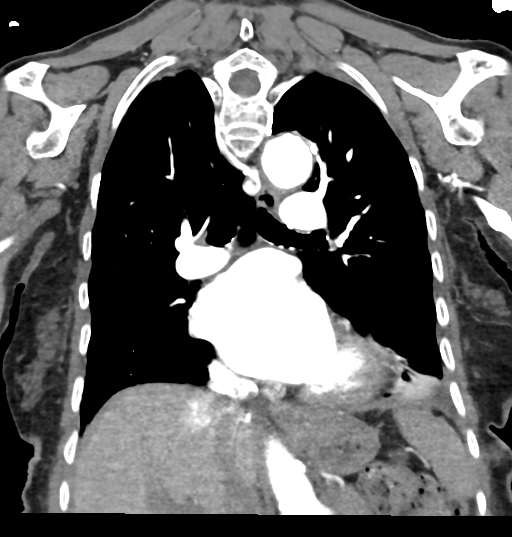

[19 of 36 positions shown; findings below may reference images not displayed]

FINDINGS: Cardiovascular: Cardiomegaly, CABG changes and LEFT atrial appendage
clip again noted. Coronary artery and aortic atherosclerotic
calcifications are identified without evidence of thoracic aortic
aneurysm or dissection. No pulmonary emboli or pericardial effusion
identified.

Mediastinum/Nodes: No enlarged mediastinal, hilar, or axillary lymph
nodes. Thyroid gland, trachea, and esophagus demonstrate no
significant findings.

Lungs/Pleura: Mild bilateral ground-glass opacities in a mosaic type
pattern probably represents small airway disease or possibly small
vessel disease. No airspace disease, consolidation, mass, nodule,
pleural effusion or pneumothorax noted. Mild bibasilar
atelectasis/scarring noted.

Upper Abdomen: No acute abnormality.

Musculoskeletal: No acute or suspicious bony abnormalities. T10
compression fracture is unchanged.

Review of the MIP images confirms the above findings.
IMPRESSION: 1. No evidence of thoracic aortic aneurysm/dissection or pulmonary
emboli.
2. Cardiomegaly and CABG changes.
3. Mild bilateral ground-glass opacities in a mosaic type pattern
probably representing small airway disease. Mild bibasilar
atelectasis/scarring
4.  Aortic Atherosclerosis ([id number]).

## 2020-03-18 DIAGNOSIS — D509 Iron deficiency anemia, unspecified: Secondary | ICD-10-CM | POA: Diagnosis not present

## 2020-03-18 DIAGNOSIS — M810 Age-related osteoporosis without current pathological fracture: Secondary | ICD-10-CM | POA: Diagnosis not present

## 2020-03-18 DIAGNOSIS — I10 Essential (primary) hypertension: Secondary | ICD-10-CM | POA: Diagnosis not present

## 2020-03-18 DIAGNOSIS — F321 Major depressive disorder, single episode, moderate: Secondary | ICD-10-CM | POA: Diagnosis not present

## 2020-03-18 DIAGNOSIS — J449 Chronic obstructive pulmonary disease, unspecified: Secondary | ICD-10-CM | POA: Diagnosis not present

## 2020-03-18 DIAGNOSIS — E039 Hypothyroidism, unspecified: Secondary | ICD-10-CM | POA: Diagnosis not present

## 2020-03-18 DIAGNOSIS — I480 Paroxysmal atrial fibrillation: Secondary | ICD-10-CM | POA: Diagnosis not present

## 2020-03-21 NOTE — Progress Notes (Signed)
NEUROLOGY FOLLOW UP OFFICE NOTE  Maria [name] [id number]  HISTORY OF PRESENT ILLNESS: Maria [name] is a 85 year old female with paroxysmal atrial fibrillation, Celiac disease, hypothyroidism, macular degeneration, hypertension, CKD stage 3, restless leg syndrome, and CAD s/p MI whom I have seen for dizziness and restless leg syndrome follows up for neuropathy.  PCP note reviewed.  UPDATE: Last seen for neuropathy in 2019.  Blood work was unremarkable, including negative ANA, mildly elevated sed rate 50, and SPEP/IFE negative for monoclonal protein.  I had started her on gabapentin 195m at bedtime.  She was lost to follow-up when the COVID-19 pandemic began.  No significant change but reports continued discomfort in the legs.  Also with bowel and bladder dysfunction.  Labs from 02/27/2020 include B12 405, ferritin 30.8, D 16.8, Hgb A1c 5.6, TSH 5.06 with free T4 1.04.    Bowel and bladder dysfunction.    HISTORY: She has had several falls over the past several years. Sometimes they are associated with dizziness, but not always. Her first fall occurred after open-heart surgery, when she fell and hit her head after turning and stepping off the rug onto the tile floor. She fractured her hip. Another time, it occurred when she leaned forward to flush the toilet. She has described episodes of dizziness. Sometimes it is a spinning sensation lasting a couple of minutes with change in position, such as turning her head or laying down in bed. She denies neck pain or weakness in the legs. She doesn't always feel unsteady when walking but when she falls, it tends to be towards the right. She has tried vestibular rehabilitation and physical therapy which provided brief relief.   Since 2015, she has had gradual progression of numbness, tingling and burning sensation in the feet.  She reports restless leg syndrome.  She had a NCV-EMG of the lower extremities which demonstrated chronic  sensorimotor axonal and demyelinating polyneuropathy.  For restless leg at night, she takes yellow mustard.  Trazodone to help sleep. B12 from 11/17/16 was 365.  She also has restless leg syndrome.  She tried sleeping with bar of soap under the sheets but it did not help. She has also used yellow mustard.  She has particular uncomfortable "tight" and "prickly" sensation in the right leg (the same leg where she had a DVT, vein graft and fractured hip).  In September 2018, her ferritin level was 23. It was recommended to restart ferrous sulfate 3234mdaily to treat restless leg.  She is not taking iron due to constipation.  She has had several brain imaging. MRI of brain with and without contrast from 01/16/16 was personally reviewed and revealed chronic small vessel ischemic changes in the cerebral white matter and cerebellum, but no acute changes. CT of head from 03/02/16 and 06/16/16 were personally reviewed and were negative for acute changes.  Carotid doppler from 2011 showed no hemodynamically significant stenosis in the ICAs and antegrade flow in the vertebral arteries.  MRI lumbar spine from 02/01/17 demonstrated multilevel degenerative changes with neural foraminal stenosis.  PAST MEDICAL HISTORY: Past Medical History:  Diagnosis Date  . Anemia   . Atonic bladder 12/11/2013  . Atrial fibrillation ([facility]  . Carotid artery occlusion    right carotid stenosis 40-60%, 4/08, neg for stenosis repeat study 11/11  . Celiac disease   . Chronic anticoagulation CHADS2VASC2 score 7 10/18/2014  . Chronic diastolic CHF (congestive heart failure) (HCC)    takes Lasix daily  . CKD (  chronic kidney disease), stage III ([place])   . Coronary artery disease    a.  08/2013 (LIMA-LAD, rSVG to LCX and Lt atrial clip),  . Depression   . DVT (deep venous thrombosis) ([place]) 09/2013  . Gastric ulcer    6-69month ago  . GERD (gastroesophageal reflux disease)    takes Omeprazole daily  . GI bleeding   .  Hemorrhoids   . Hot flashes    takes ZOloft 3 times a week  . Hypertension   . Hypothyroidism    takes Synthroid daily as result of AMiodarone  . Insomnia   . Macular degeneration   . Mild aortic insufficiency    a. Echo 10/2013: mild AI.  .Marland KitchenModerate mitral regurgitation by prior echocardiogram    a. Echo 10/2013.  . Moderate obstructive sleep apnea    uses CPAP;sleep study about 4-566monthago  . MVP (mitral valve prolapse)   . Osteoporosis   . PAF (paroxysmal atrial fibrillation) ([facility]  . Peripheral edema    left  . Presbycusis   . Pulmonary hypertension ([facility]  . RLS (restless legs syndrome)   . Thyroid nodule    50m60mno change 11/11  . Urethral stricture   . Urinary anastomotic stricture   . Urinary retention   . Vitamin D deficiency     MEDICATIONS: Current Outpatient Medications on File Prior to Visit  Medication Sig Dispense Refill  . acetaminophen (TYLENOL) 500 MG tablet Take 1,000 mg by mouth every 6 (six) hours as needed.    . aMarland Kitcheniodarone (PACERONE) 200 MG tablet Take 1 tablet (200 mg total) by mouth daily. 90 tablet 3  . amLODipine (NORVASC) 2.5 MG tablet Take 1 tablet (2.5 mg total) by mouth daily for 30 days. 30 tablet 0  . apixaban (ELIQUIS) 2.5 MG TABS tablet Take 1 tablet (2.5 mg total) by mouth 2 (two) times daily for 30 days. 60 tablet 0  . furosemide (LASIX) 20 MG tablet Take 1 tablet (20 mg total) by mouth daily as needed for fluid or edema. 30 tablet   . levothyroxine (SYNTHROID, LEVOTHROID) 50 MCG tablet Take 1 tablet (50 mcg total) by mouth daily before breakfast. 30 tablet 11  . Magnesium 250 MG TABS Take 250 mg by mouth daily.    . Melatonin CR 3 MG TBCR Take 3 mg by mouth at bedtime.    . MULTIPLE VITAMIN PO Take 1 tablet by mouth daily.    . pantoprazole (PROTONIX) 40 MG tablet Take 40 mg by mouth daily.    . Wheat Dextrin (BENEFIBER) POWD Take 1 Scoop by mouth daily.     No current facility-administered medications on file prior to visit.     ALLERGIES: Allergies  Allergen Reactions  . Alendronate Sodium Other (See Comments)    Jaw pain  . Amoxicillin Other (See Comments)    Nausea, dizziness Nausea, dizziness  . Codeine Nausea And Vomiting  . Fosamax [Alendronate Sodium] Other (See Comments)    Jaw pain  . Gluten Meal Other (See Comments)    Celiac Disease Celiac Disease  . Hydrochlorothiazide Other (See Comments)    Hyponatremia, GI upset Hyponatremia, GI upset  . Tetanus Toxoid, Adsorbed Other (See Comments)    Bad local reaction  . Tetanus Toxoids Other (See Comments)    Bad local reaction    FAMILY HISTORY: Family History  Problem Relation Age of Onset  . Heart attack Mother   . CVA Mother   . CAD Father   .  Colon cancer Father   . Heart attack Father   . Heart attack Brother   . Peripheral vascular disease Brother   . AAA (abdominal aortic aneurysm) Brother     SOCIAL HISTORY: Social History   Socioeconomic History  . Marital status: Widowed    Spouse name: Not on file  . Number of children: Not on file  . Years of education: Not on file  . Highest education level: Not on file  Occupational History  . Not on file  Tobacco Use  . Smoking status: Never Smoker  . Smokeless tobacco: Never Used  Substance and Sexual Activity  . Alcohol use: No  . Drug use: No  . Sexual activity: Yes    Birth control/protection: Surgical  Other Topics Concern  . Not on file  Social History Narrative  . Not on file   Social Determinants of Health   Financial Resource Strain:   . Difficulty of Paying Living Expenses:   Food Insecurity:   . Worried About Charity fundraiser in the Last Year:   . Arboriculturist in the Last Year:   Transportation Needs:   . Film/video editor (Medical):   Marland Kitchen Lack of Transportation (Non-Medical):   Physical Activity:   . Days of Exercise per Week:   . Minutes of Exercise per Session:   Stress:   . Feeling of Stress :   Social Connections:   . Frequency of  Communication with Friends and Family:   . Frequency of Social Gatherings with Friends and Family:   . Attends Religious Services:   . Active Member of Clubs or Organizations:   . Attends Archivist Meetings:   Marland Kitchen Marital Status:   Intimate Partner Violence:   . Fear of Current or Ex-Partner:   . Emotionally Abused:   Marland Kitchen Physically Abused:   . Sexually Abused:     PHYSICAL EXAM: Blood pressure (!) 156/70, pulse 89, height 5' 1"  (1.549 m), weight 108 lb 9.6 oz (49.3 kg), SpO2 95 %. General: No acute distress.  Patient appears well-groomed.   Head:  Normocephalic/atraumatic Eyes:  Fundi examined but not visualized Neck: supple, no paraspinal tenderness, full range of motion Heart:  Regular rate and rhythm Lungs:  Clear to auscultation bilaterally Back: No paraspinal tenderness Neurological Exam: alert and oriented to person, place, and time. Attention span and concentration intact, recent and remote memory intact, fund of knowledge intact.  Speech fluent and not dysarthric, language intact.  CN II-XII intact. Bulk and tone normal, muscle strength 5/5 throughout.  Sensation to pinprick and vibration reduced in toes.  Deep tendon reflexes 1+ throughout, toes downgoing.  Finger to nose  testing intact.  Gait steady.  IMPRESSION: Peripheral neuropathy, possibly related to Celiac disease  PLAN: At this time, no further testing is warranted.  Management would be focused on treating symptoms, such as pain.  She declines at this time.  She will follow up as needed.  Metta Clines, DO  CC: [name], MD

## 2020-03-25 ENCOUNTER — Encounter: Payer: Self-pay | Admitting: Neurology

## 2020-03-25 ENCOUNTER — Ambulatory Visit (INDEPENDENT_AMBULATORY_CARE_PROVIDER_SITE_OTHER): Payer: Medicare Other | Admitting: Neurology

## 2020-03-25 ENCOUNTER — Other Ambulatory Visit: Payer: Self-pay

## 2020-03-25 VITALS — BP 156/70 | HR 89 | Ht 61.0 in | Wt 108.6 lb

## 2020-03-25 DIAGNOSIS — G609 Hereditary and idiopathic neuropathy, unspecified: Secondary | ICD-10-CM | POA: Diagnosis not present

## 2020-03-25 NOTE — Progress Notes (Signed)
Cardiology Office Note:    Date:  03/27/2020   ID:  Maria [name], DOB [birth year], MRN [id number]  PCP:  [name], MD  Cardiologist:  No primary care provider on file.   Referring MD: [name], MD   Chief Complaint  Patient presents with  . Atrial Fibrillation    History of Present Illness:    Maria [name] is a 86 y.o. female with a hx of paroxysmal atrial fibrillation, amiodarone therapy, bilateral carotid stenosis, coronary artery disease with multivessel coronary bypass grafting 2014, chronic kidney disease, chronic anticoagulation with Xarelto, Celiac disease, and hypertension.  Recent TIA leading to switch from Xarelto to apixaban 2.5 mg twice daily.  She has gained 6 pounds over the past year and has noted abdominal girth increase.  She is concerned that she may have an abdominal blockage.  She has dyspnea on exertion that she feels is gradually getting worse.  There is no orthopnea or anginal quality pain.  She sleeps poorly.  She uses melatonin.  She gets right parasternal pain when she lies completely flat on her back.  Changing positions resolves the discomfort.  Past Medical History:  Diagnosis Date  . Anemia   . Atonic bladder 12/11/2013  . Atrial fibrillation ([place])   . Carotid artery occlusion    right carotid stenosis 40-60%, 4/08, neg for stenosis repeat study 11/11  . Celiac disease   . Chronic anticoagulation CHADS2VASC2 score 7 10/18/2014  . Chronic diastolic CHF (congestive heart failure) (HCC)    takes Lasix daily  . CKD (chronic kidney disease), stage III   . Coronary artery disease    a.  08/2013 (LIMA-LAD, rSVG to LCX and Lt atrial clip),  . Depression   . DVT (deep venous thrombosis) ([place]) 09/2013  . Gastric ulcer    6-81month ago  . GERD (gastroesophageal reflux disease)    takes Omeprazole daily  . GI bleeding   . Hemorrhoids   . Hot flashes    takes ZOloft 3 times a week  . Hypertension   . Hypothyroidism    takes  Synthroid daily as result of AMiodarone  . Insomnia   . Macular degeneration   . Mild aortic insufficiency    a. Echo 10/2013: mild AI.  .Marland KitchenModerate mitral regurgitation by prior echocardiogram    a. Echo 10/2013.  . Moderate obstructive sleep apnea    uses CPAP;sleep study about 4-560monthago  . MVP (mitral valve prolapse)   . Osteoporosis   . PAF (paroxysmal atrial fibrillation) (HCD[place]  . Peripheral edema    left  . Presbycusis   . Pulmonary hypertension ([facility]  . RLS (restless legs syndrome)   . Thyroid nodule    43m9mno change 11/11  . Urethral stricture   . Urinary anastomotic stricture   . Urinary retention   . Vitamin D deficiency     Past Surgical History:  Procedure Laterality Date  . APPENDECTOMY    . bilateral cataract surgery    . CARDIAC CATHETERIZATION  08-06-13  . CORONARY ARTERY BYPASS GRAFT N/A 08/28/2013   Procedure: CORONARY ARTERY BYPASS GRAFTING (CABG) x2 using right greater saphenous vein and left internal mammary artery. ;  Surgeon: EdwGrace IsaacD;  [facility];  Laterality: N/A;  . HIP ARTHROPLASTY Right 12/09/2013   Procedure: ARTHROPLASTY BIPOLAR HIP CEMENTED;  Surgeon: FraKerin SalenD;  [facility];  Laterality: Right;  . INTRAOPERATIVE TRANSESOPHAGEAL  ECHOCARDIOGRAM N/A 08/28/2013   Procedure: INTRAOPERATIVE TRANSESOPHAGEAL ECHOCARDIOGRAM;  Surgeon: Grace Isaac, MD;  Location: [place];  Service: Open Heart Surgery;  Laterality: N/A;  . LEFT AND RIGHT HEART CATHETERIZATION WITH CORONARY ANGIOGRAM N/A 08/16/2013   Procedure: LEFT AND RIGHT HEART CATHETERIZATION WITH CORONARY ANGIOGRAM;  Surgeon: [name], MD;  [facility];  Service: Cardiovascular;  Laterality: N/A;  . MANDIBLE SURGERY    . TCS    . TONSILLECTOMY    . trigger thumb    . TUBAL LIGATION      Current Medications: Current Meds  Medication Sig  . acetaminophen (TYLENOL) 500 MG tablet Take 1,000  mg by mouth every 6 (six) hours as needed.  Marland Kitchen amiodarone (PACERONE) 200 MG tablet Take 100 mg by mouth daily.  Marland Kitchen amLODipine (NORVASC) 2.5 MG tablet Take 1 tablet (2.5 mg total) by mouth daily for 30 days.  Marland Kitchen apixaban (ELIQUIS) 2.5 MG TABS tablet Take 1 tablet (2.5 mg total) by mouth 2 (two) times daily for 30 days.  . furosemide (LASIX) 20 MG tablet Take 1 tablet (20 mg total) by mouth daily as needed for fluid or edema.  Marland Kitchen levothyroxine (SYNTHROID) 50 MCG tablet Take 25 mcg by mouth daily before breakfast.  . Magnesium 250 MG TABS Take 500 mg by mouth daily.   . Melatonin CR 3 MG TBCR Take 3 mg by mouth at bedtime.  . MULTIPLE VITAMIN PO Take 1 tablet by mouth daily.  . Vitamin D, Cholecalciferol, 25 MCG (1000 UT) TABS Take 2 tablets by mouth daily.     Allergies:   Alendronate sodium; Amoxicillin; Codeine; Fosamax [alendronate sodium]; Gluten meal; Hydrochlorothiazide; Tetanus toxoid, adsorbed; and Tetanus toxoids   Social History   Socioeconomic History  . Marital status: Widowed    Spouse name: Not on file  . Number of children: Not on file  . Years of education: Not on file  . Highest education level: Not on file  Occupational History  . Not on file  Tobacco Use  . Smoking status: Never Smoker  . Smokeless tobacco: Never Used  Substance and Sexual Activity  . Alcohol use: No  . Drug use: No  . Sexual activity: Yes    Birth control/protection: Surgical  Other Topics Concern  . Not on file  Social History Narrative   Lives in retirement community   Right handed   Social Determinants of Health   Financial Resource Strain:   . Difficulty of Paying Living Expenses:   Food Insecurity:   . Worried About Charity fundraiser in the Last Year:   . Arboriculturist in the Last Year:   Transportation Needs:   . Film/video editor (Medical):   Marland Kitchen Lack of Transportation (Non-Medical):   Physical Activity:   . Days of Exercise per Week:   . Minutes of Exercise per Session:     Stress:   . Feeling of Stress :   Social Connections:   . Frequency of Communication with Friends and Family:   . Frequency of Social Gatherings with Friends and Family:   . Attends Religious Services:   . Active Member of Clubs or Organizations:   . Attends Archivist Meetings:   Marland Kitchen Marital Status:      Family History: The patient's family history includes AAA (abdominal aortic aneurysm) in her brother; CAD in her father; CVA in her mother; Colon cancer in her father; Heart attack in her brother, father, and  mother; Peripheral vascular disease in her brother.  ROS:   Please see the history of present illness.    She has difficulty walking to the cafeteria because of dyspnea.  This is been a gradual development.  She has not had syncope, denies orthopnea, and no swelling of the ankles.  She denies palpitations or instances of atrial fibrillation that she has been aware of.  All other systems reviewed and are negative.  EKGs/Labs/Other Studies Reviewed:    The following studies were reviewed today: No recent diagnostic cardiac testing  EKG:  EKG sinus bradycardia, left anterior hemiblock, right bundle branch block.  Recent Labs: No results found for requested labs within last 8760 hours.  Recent Lipid Panel    Component Value Date/Time   CHOL 223 (H) 11/27/2018 0637   TRIG 93 11/27/2018 0637   HDL 74 11/27/2018 0637   CHOLHDL 3.0 11/27/2018 0637   VLDL 19 11/27/2018 0637   LDLCALC 130 (H) 11/27/2018 0637    Physical Exam:    VS:  BP (!) 156/64   Pulse (!) 57   Ht 5' 1"  (1.549 m)   Wt 108 lb 1.9 oz (49 kg)   SpO2 97%   BMI 20.43 kg/m     Wt Readings from Last 3 Encounters:  03/27/20 108 lb 1.9 oz (49 kg)  03/25/20 108 lb 9.6 oz (49.3 kg)  12/19/18 107 lb (48.5 kg)     GEN: Elderly but appears well-nourished and slightly younger than stated age.. No acute distress There is obvious kyphosis. HEENT: Normal NECK: No JVD. LYMPHATICS: No  lymphadenopathy CARDIAC:  RRR without murmur, gallop, or edema. VASCULAR:  Normal Pulses. No bruits. RESPIRATORY:  Clear to auscultation without rales, wheezing or rhonchi  ABDOMEN: Soft, non-tender, non-distended, No pulsatile mass, MUSCULOSKELETAL: No deformity  SKIN: Warm and dry NEUROLOGIC:  Alert and oriented x 3 PSYCHIATRIC:  Normal affect   ASSESSMENT:    1. Paroxysmal atrial fibrillation (HCC)   2. Chronic anticoagulation CHADS2VASC2 score 7   3. Coronary artery disease involving coronary bypass graft of native heart with angina pectoris ([place])   4. On amiodarone therapy   5. Nonrheumatic mitral valve regurgitation   6. Stage 3 chronic kidney disease, unspecified whether stage 3a or 3b CKD   7. Educated about COVID-19 virus infection    PLAN:    In order of problems listed above:  1. In sinus rhythm today.  On 100 mg of amiodarone per day. 2. No bleeding on low-dose apixaban.  Recent creatinine and hemoglobin were stable. 3. Chest discomfort is atypical for angina.  It is almost certainly musculoskeletal. 4. Will get chest x-ray, sed rate, and BNP to determine if dyspnea is cardiac related or possibly amiodarone related. 5. Soft 2/6 mitral regurgitation murmur unchanged from prior 6. Recent blood work demonstrates normal creatinine of 0.81 7. She has received the COVID-19 vaccine and is stable.  Clinical follow-up 1 year Recent TSH was normal.  Will need liver panel and TSH in 6 months.   Medication Adjustments/Labs and Tests Ordered: Current medicines are reviewed at length with the patient today.  Concerns regarding medicines are outlined above.  No orders of the defined types were placed in this encounter.  No orders of the defined types were placed in this encounter.   There are no Patient Instructions on file for this visit.   Signed, [name], MD  03/27/2020 1:32 PM    [facility]

## 2020-03-25 NOTE — Patient Instructions (Signed)
At this point, there isn't any specific treatment to cure or improve the neuropathy.  Management is treatment of symptoms.

## 2020-03-27 ENCOUNTER — Ambulatory Visit
Admission: RE | Admit: 2020-03-27 | Discharge: 2020-03-27 | Disposition: A | Discharge status: Home / Self Care | Payer: Medicare Other | Source: Ambulatory Visit | Attending: Interventional Cardiology | Admitting: Interventional Cardiology

## 2020-03-27 ENCOUNTER — Encounter: Payer: Self-pay | Admitting: Interventional Cardiology

## 2020-03-27 ENCOUNTER — Telehealth: Payer: Self-pay | Admitting: Interventional Cardiology

## 2020-03-27 ENCOUNTER — Ambulatory Visit (INDEPENDENT_AMBULATORY_CARE_PROVIDER_SITE_OTHER): Payer: Medicare Other | Admitting: Interventional Cardiology

## 2020-03-27 ENCOUNTER — Other Ambulatory Visit: Payer: Self-pay

## 2020-03-27 VITALS — BP 156/64 | HR 57 | Ht 61.0 in | Wt 108.1 lb

## 2020-03-27 DIAGNOSIS — N183 Chronic kidney disease, stage 3 unspecified: Secondary | ICD-10-CM

## 2020-03-27 DIAGNOSIS — Z7189 Other specified counseling: Secondary | ICD-10-CM | POA: Diagnosis not present

## 2020-03-27 DIAGNOSIS — I340 Nonrheumatic mitral (valve) insufficiency: Secondary | ICD-10-CM

## 2020-03-27 DIAGNOSIS — R0602 Shortness of breath: Secondary | ICD-10-CM | POA: Diagnosis not present

## 2020-03-27 DIAGNOSIS — Z7901 Long term (current) use of anticoagulants: Secondary | ICD-10-CM

## 2020-03-27 DIAGNOSIS — I25709 Atherosclerosis of coronary artery bypass graft(s), unspecified, with unspecified angina pectoris: Secondary | ICD-10-CM | POA: Diagnosis not present

## 2020-03-27 DIAGNOSIS — I480 Paroxysmal atrial fibrillation: Secondary | ICD-10-CM

## 2020-03-27 DIAGNOSIS — Z79899 Other long term (current) drug therapy: Secondary | ICD-10-CM

## 2020-03-27 NOTE — Patient Instructions (Signed)
Medication Instructions:  Your physician recommends that you continue on your current medications as directed. Please refer to the Current Medication list given to you today.  *If you need a refill on your cardiac medications before your next appointment, please call your pharmacy*   Lab Work: Pro BNP and ESR today  If you have labs (blood work) drawn today and your tests are completely normal, you will receive your results only by:  [place] (if you have MyChart) OR  A paper copy in the mail If you have any lab test that is abnormal or we need to change your treatment, we will call you to review the results.   Testing/Procedures: A chest x-ray takes a picture of the organs and structures inside the chest, including the heart, lungs, and blood vessels. This test can show several things, including, whether the heart is enlarges; whether fluid is building up in the lungs; and whether pacemaker / defibrillator leads are still in place.  [facility] [address]   Follow-Up: At [facility], you and your health needs are our priority.  As part of our continuing mission to provide you with exceptional heart care, we have created designated Provider Care Teams.  These Care Teams include your primary Cardiologist (physician) and Advanced Practice Providers (APPs -  Physician Assistants and Nurse Practitioners) who all work together to provide you with the care you need, when you need it.  We recommend signing up for the patient portal called "MyChart".  Sign up information is provided on this After Visit Summary.  MyChart is used to connect with patients for Virtual Visits (Telemedicine).  Patients are able to view lab/test results, encounter notes, upcoming appointments, etc.  Non-urgent messages can be sent to your provider as well.   To learn more about what you can do with MyChart, go to [email or web address].    Your next appointment:    12 month(s)  The format for your next appointment:   In Person  Provider:   You may see Dr. Daneen Schick or one of the following Advanced Practice Providers on your designated Care Team:    Truitt Merle, NP  Cecilie Kicks, NP  Kathyrn Drown, NP    Other Instructions

## 2020-03-27 NOTE — Telephone Encounter (Signed)
April from [facility] states that the driver will be at the office in 20 minutes to get the patient to take to [place]

## 2020-03-28 LAB — PRO B NATRIURETIC PEPTIDE: NT-Pro BNP: 460 pg/mL (ref 0–738)

## 2020-03-28 LAB — SEDIMENTATION RATE: Sed Rate: 37 mm/hr (ref 0–40)

## 2020-04-22 ENCOUNTER — Telehealth: Payer: Self-pay | Admitting: Interventional Cardiology

## 2020-04-22 ENCOUNTER — Other Ambulatory Visit: Payer: Self-pay | Admitting: Interventional Cardiology

## 2020-04-22 MED ORDER — AMIODARONE HCL 200 MG PO TABS: 100.0000 mg | ORAL_TABLET | Freq: Every day | ORAL | 11 refills | Status: DC

## 2020-04-22 NOTE — Telephone Encounter (Signed)
   *  STAT* If patient is at the pharmacy, call can be transferred to refill team.   1. Which medications need to be refilled? (please list name of each medication and dose if known) amiodarone (PACERONE) 200 MG tablet  2. Which pharmacy/location (including street and city if local pharmacy) is medication to be sent to? [facility] - [place], [place] - [address]  3. Do they need a 30 day or 90 day supply? 30 days  Pt said she needs this tomorrow since she will be leaving on thursday

## 2020-04-22 NOTE — Telephone Encounter (Signed)
Pt's medication was sent to pt's pharmacy as requested. Confirmation received.  °

## 2020-05-09 DIAGNOSIS — M4120 Other idiopathic scoliosis, site unspecified: Secondary | ICD-10-CM | POA: Diagnosis not present

## 2020-05-27 DIAGNOSIS — M4004 Postural kyphosis, thoracic region: Secondary | ICD-10-CM | POA: Diagnosis not present

## 2020-05-27 DIAGNOSIS — R293 Abnormal posture: Secondary | ICD-10-CM | POA: Diagnosis not present

## 2020-05-27 DIAGNOSIS — M545 Low back pain: Secondary | ICD-10-CM | POA: Diagnosis not present

## 2020-05-27 DIAGNOSIS — M546 Pain in thoracic spine: Secondary | ICD-10-CM | POA: Diagnosis not present

## 2020-05-27 DIAGNOSIS — M4125 Other idiopathic scoliosis, thoracolumbar region: Secondary | ICD-10-CM | POA: Diagnosis not present

## 2020-06-02 DIAGNOSIS — M4004 Postural kyphosis, thoracic region: Secondary | ICD-10-CM | POA: Diagnosis not present

## 2020-06-02 DIAGNOSIS — R293 Abnormal posture: Secondary | ICD-10-CM | POA: Diagnosis not present

## 2020-06-02 DIAGNOSIS — M546 Pain in thoracic spine: Secondary | ICD-10-CM | POA: Diagnosis not present

## 2020-06-02 DIAGNOSIS — M545 Low back pain: Secondary | ICD-10-CM | POA: Diagnosis not present

## 2020-06-02 DIAGNOSIS — M4125 Other idiopathic scoliosis, thoracolumbar region: Secondary | ICD-10-CM | POA: Diagnosis not present

## 2020-06-12 DIAGNOSIS — D509 Iron deficiency anemia, unspecified: Secondary | ICD-10-CM | POA: Diagnosis not present

## 2020-06-12 DIAGNOSIS — I480 Paroxysmal atrial fibrillation: Secondary | ICD-10-CM | POA: Diagnosis not present

## 2020-06-12 DIAGNOSIS — M810 Age-related osteoporosis without current pathological fracture: Secondary | ICD-10-CM | POA: Diagnosis not present

## 2020-06-12 DIAGNOSIS — I10 Essential (primary) hypertension: Secondary | ICD-10-CM | POA: Diagnosis not present

## 2020-06-12 DIAGNOSIS — F321 Major depressive disorder, single episode, moderate: Secondary | ICD-10-CM | POA: Diagnosis not present

## 2020-06-12 DIAGNOSIS — E039 Hypothyroidism, unspecified: Secondary | ICD-10-CM | POA: Diagnosis not present

## 2020-06-12 DIAGNOSIS — J449 Chronic obstructive pulmonary disease, unspecified: Secondary | ICD-10-CM | POA: Diagnosis not present

## 2020-06-13 DIAGNOSIS — M545 Low back pain: Secondary | ICD-10-CM | POA: Diagnosis not present

## 2020-06-13 DIAGNOSIS — M4125 Other idiopathic scoliosis, thoracolumbar region: Secondary | ICD-10-CM | POA: Diagnosis not present

## 2020-06-13 DIAGNOSIS — M4004 Postural kyphosis, thoracic region: Secondary | ICD-10-CM | POA: Diagnosis not present

## 2020-06-13 DIAGNOSIS — R293 Abnormal posture: Secondary | ICD-10-CM | POA: Diagnosis not present

## 2020-06-13 DIAGNOSIS — M546 Pain in thoracic spine: Secondary | ICD-10-CM | POA: Diagnosis not present

## 2020-06-19 ENCOUNTER — Other Ambulatory Visit: Payer: Self-pay | Admitting: Gastroenterology

## 2020-06-19 DIAGNOSIS — R5383 Other fatigue: Secondary | ICD-10-CM | POA: Diagnosis not present

## 2020-06-19 DIAGNOSIS — R194 Change in bowel habit: Secondary | ICD-10-CM | POA: Diagnosis not present

## 2020-06-19 DIAGNOSIS — Z9189 Other specified personal risk factors, not elsewhere classified: Secondary | ICD-10-CM | POA: Diagnosis not present

## 2020-07-07 DIAGNOSIS — R293 Abnormal posture: Secondary | ICD-10-CM | POA: Diagnosis not present

## 2020-07-07 DIAGNOSIS — M4004 Postural kyphosis, thoracic region: Secondary | ICD-10-CM | POA: Diagnosis not present

## 2020-07-07 DIAGNOSIS — M4125 Other idiopathic scoliosis, thoracolumbar region: Secondary | ICD-10-CM | POA: Diagnosis not present

## 2020-07-07 DIAGNOSIS — M545 Low back pain: Secondary | ICD-10-CM | POA: Diagnosis not present

## 2020-07-07 DIAGNOSIS — M546 Pain in thoracic spine: Secondary | ICD-10-CM | POA: Diagnosis not present

## 2020-07-14 DIAGNOSIS — M4125 Other idiopathic scoliosis, thoracolumbar region: Secondary | ICD-10-CM | POA: Diagnosis not present

## 2020-07-14 DIAGNOSIS — M546 Pain in thoracic spine: Secondary | ICD-10-CM | POA: Diagnosis not present

## 2020-07-14 DIAGNOSIS — R293 Abnormal posture: Secondary | ICD-10-CM | POA: Diagnosis not present

## 2020-07-14 DIAGNOSIS — M545 Low back pain: Secondary | ICD-10-CM | POA: Diagnosis not present

## 2020-07-14 DIAGNOSIS — M4004 Postural kyphosis, thoracic region: Secondary | ICD-10-CM | POA: Diagnosis not present

## 2020-07-18 DIAGNOSIS — R293 Abnormal posture: Secondary | ICD-10-CM | POA: Diagnosis not present

## 2020-07-18 DIAGNOSIS — M4004 Postural kyphosis, thoracic region: Secondary | ICD-10-CM | POA: Diagnosis not present

## 2020-07-18 DIAGNOSIS — M546 Pain in thoracic spine: Secondary | ICD-10-CM | POA: Diagnosis not present

## 2020-07-18 DIAGNOSIS — M4125 Other idiopathic scoliosis, thoracolumbar region: Secondary | ICD-10-CM | POA: Diagnosis not present

## 2020-07-18 DIAGNOSIS — M545 Low back pain: Secondary | ICD-10-CM | POA: Diagnosis not present

## 2020-07-29 ENCOUNTER — Ambulatory Visit: Payer: Medicare Other | Admitting: Neurology

## 2020-08-18 DIAGNOSIS — M4120 Other idiopathic scoliosis, site unspecified: Secondary | ICD-10-CM | POA: Diagnosis not present

## 2020-08-28 DIAGNOSIS — M4120 Other idiopathic scoliosis, site unspecified: Secondary | ICD-10-CM | POA: Diagnosis not present

## 2020-09-03 ENCOUNTER — Ambulatory Visit
Admission: RE | Admit: 2020-09-03 | Discharge: 2020-09-03 | Disposition: A | Discharge status: Home / Self Care | Payer: Medicare Other | Source: Ambulatory Visit | Attending: Gastroenterology | Admitting: Gastroenterology

## 2020-09-03 DIAGNOSIS — R194 Change in bowel habit: Secondary | ICD-10-CM

## 2020-09-03 DIAGNOSIS — K635 Polyp of colon: Secondary | ICD-10-CM | POA: Diagnosis not present

## 2020-09-25 DIAGNOSIS — M4120 Other idiopathic scoliosis, site unspecified: Secondary | ICD-10-CM | POA: Diagnosis not present

## 2020-11-01 DIAGNOSIS — R110 Nausea: Secondary | ICD-10-CM | POA: Diagnosis not present

## 2020-11-01 DIAGNOSIS — I440 Atrioventricular block, first degree: Secondary | ICD-10-CM | POA: Diagnosis not present

## 2020-11-01 DIAGNOSIS — I10 Essential (primary) hypertension: Secondary | ICD-10-CM | POA: Diagnosis not present

## 2020-11-01 DIAGNOSIS — R42 Dizziness and giddiness: Secondary | ICD-10-CM | POA: Diagnosis not present

## 2020-11-01 DIAGNOSIS — I4510 Unspecified right bundle-branch block: Secondary | ICD-10-CM | POA: Diagnosis not present

## 2020-12-11 DIAGNOSIS — R197 Diarrhea, unspecified: Secondary | ICD-10-CM | POA: Diagnosis not present

## 2020-12-18 DIAGNOSIS — R197 Diarrhea, unspecified: Secondary | ICD-10-CM | POA: Diagnosis not present

## 2020-12-29 DIAGNOSIS — E222 Syndrome of inappropriate secretion of antidiuretic hormone: Secondary | ICD-10-CM | POA: Diagnosis not present

## 2020-12-29 DIAGNOSIS — K900 Celiac disease: Secondary | ICD-10-CM | POA: Diagnosis not present

## 2020-12-29 DIAGNOSIS — I10 Essential (primary) hypertension: Secondary | ICD-10-CM | POA: Diagnosis not present

## 2020-12-29 DIAGNOSIS — E039 Hypothyroidism, unspecified: Secondary | ICD-10-CM | POA: Diagnosis not present

## 2020-12-29 DIAGNOSIS — I700 Atherosclerosis of aorta: Secondary | ICD-10-CM | POA: Diagnosis not present

## 2020-12-29 DIAGNOSIS — F321 Major depressive disorder, single episode, moderate: Secondary | ICD-10-CM | POA: Diagnosis not present

## 2020-12-29 DIAGNOSIS — I480 Paroxysmal atrial fibrillation: Secondary | ICD-10-CM | POA: Diagnosis not present

## 2020-12-29 DIAGNOSIS — G603 Idiopathic progressive neuropathy: Secondary | ICD-10-CM | POA: Diagnosis not present

## 2020-12-29 DIAGNOSIS — Z79899 Other long term (current) drug therapy: Secondary | ICD-10-CM | POA: Diagnosis not present

## 2021-01-20 DIAGNOSIS — R197 Diarrhea, unspecified: Secondary | ICD-10-CM | POA: Diagnosis not present

## 2021-01-20 DIAGNOSIS — I10 Essential (primary) hypertension: Secondary | ICD-10-CM | POA: Diagnosis not present

## 2021-01-20 DIAGNOSIS — D509 Iron deficiency anemia, unspecified: Secondary | ICD-10-CM | POA: Diagnosis not present

## 2021-02-10 DIAGNOSIS — R5383 Other fatigue: Secondary | ICD-10-CM | POA: Diagnosis not present

## 2021-02-10 DIAGNOSIS — Z86010 Personal history of colonic polyps: Secondary | ICD-10-CM | POA: Diagnosis not present

## 2021-02-10 DIAGNOSIS — R197 Diarrhea, unspecified: Secondary | ICD-10-CM | POA: Diagnosis not present

## 2021-02-24 ENCOUNTER — Other Ambulatory Visit: Payer: Self-pay | Admitting: Physician Assistant

## 2021-02-24 DIAGNOSIS — Z86010 Personal history of colonic polyps: Secondary | ICD-10-CM

## 2021-03-25 ENCOUNTER — Other Ambulatory Visit: Payer: Self-pay | Admitting: Physician Assistant

## 2021-03-25 DIAGNOSIS — Z86010 Personal history of colonic polyps: Secondary | ICD-10-CM

## 2021-03-26 DIAGNOSIS — I351 Nonrheumatic aortic (valve) insufficiency: Secondary | ICD-10-CM | POA: Diagnosis not present

## 2021-03-26 DIAGNOSIS — J90 Pleural effusion, not elsewhere classified: Secondary | ICD-10-CM | POA: Diagnosis not present

## 2021-03-26 DIAGNOSIS — E871 Hypo-osmolality and hyponatremia: Secondary | ICD-10-CM | POA: Diagnosis not present

## 2021-03-26 DIAGNOSIS — N183 Chronic kidney disease, stage 3 unspecified: Secondary | ICD-10-CM | POA: Diagnosis not present

## 2021-03-26 DIAGNOSIS — W1809XA Striking against other object with subsequent fall, initial encounter: Secondary | ICD-10-CM | POA: Diagnosis present

## 2021-03-26 DIAGNOSIS — S72045A Nondisplaced fracture of base of neck of left femur, initial encounter for closed fracture: Secondary | ICD-10-CM | POA: Diagnosis not present

## 2021-03-26 DIAGNOSIS — S72002A Fracture of unspecified part of neck of left femur, initial encounter for closed fracture: Secondary | ICD-10-CM | POA: Diagnosis not present

## 2021-03-26 DIAGNOSIS — M25522 Pain in left elbow: Secondary | ICD-10-CM | POA: Diagnosis not present

## 2021-03-26 DIAGNOSIS — R52 Pain, unspecified: Secondary | ICD-10-CM | POA: Diagnosis not present

## 2021-03-26 DIAGNOSIS — D62 Acute posthemorrhagic anemia: Secondary | ICD-10-CM | POA: Diagnosis not present

## 2021-03-26 DIAGNOSIS — S199XXA Unspecified injury of neck, initial encounter: Secondary | ICD-10-CM | POA: Diagnosis not present

## 2021-03-26 DIAGNOSIS — T148XXA Other injury of unspecified body region, initial encounter: Secondary | ICD-10-CM | POA: Diagnosis not present

## 2021-03-26 DIAGNOSIS — J439 Emphysema, unspecified: Secondary | ICD-10-CM | POA: Diagnosis not present

## 2021-03-26 DIAGNOSIS — I272 Pulmonary hypertension, unspecified: Secondary | ICD-10-CM | POA: Diagnosis not present

## 2021-03-26 DIAGNOSIS — E039 Hypothyroidism, unspecified: Secondary | ICD-10-CM | POA: Diagnosis not present

## 2021-03-26 DIAGNOSIS — W19XXXA Unspecified fall, initial encounter: Secondary | ICD-10-CM | POA: Diagnosis not present

## 2021-03-26 DIAGNOSIS — G2581 Restless legs syndrome: Secondary | ICD-10-CM | POA: Diagnosis not present

## 2021-03-26 DIAGNOSIS — I130 Hypertensive heart and chronic kidney disease with heart failure and stage 1 through stage 4 chronic kidney disease, or unspecified chronic kidney disease: Secondary | ICD-10-CM | POA: Diagnosis not present

## 2021-03-26 DIAGNOSIS — Z885 Allergy status to narcotic agent status: Secondary | ICD-10-CM | POA: Diagnosis not present

## 2021-03-26 DIAGNOSIS — Z7901 Long term (current) use of anticoagulants: Secondary | ICD-10-CM | POA: Diagnosis not present

## 2021-03-26 DIAGNOSIS — S72042A Displaced fracture of base of neck of left femur, initial encounter for closed fracture: Secondary | ICD-10-CM | POA: Diagnosis not present

## 2021-03-26 DIAGNOSIS — S0990XA Unspecified injury of head, initial encounter: Secondary | ICD-10-CM | POA: Diagnosis not present

## 2021-03-26 DIAGNOSIS — N1831 Chronic kidney disease, stage 3a: Secondary | ICD-10-CM | POA: Diagnosis present

## 2021-03-26 DIAGNOSIS — Z7189 Other specified counseling: Secondary | ICD-10-CM | POA: Diagnosis not present

## 2021-03-26 DIAGNOSIS — Z043 Encounter for examination and observation following other accident: Secondary | ICD-10-CM | POA: Diagnosis not present

## 2021-03-26 DIAGNOSIS — K900 Celiac disease: Secondary | ICD-10-CM | POA: Diagnosis present

## 2021-03-26 DIAGNOSIS — R519 Headache, unspecified: Secondary | ICD-10-CM | POA: Diagnosis not present

## 2021-03-26 DIAGNOSIS — Z01818 Encounter for other preprocedural examination: Secondary | ICD-10-CM | POA: Diagnosis not present

## 2021-03-26 DIAGNOSIS — I480 Paroxysmal atrial fibrillation: Secondary | ICD-10-CM | POA: Diagnosis not present

## 2021-03-26 DIAGNOSIS — I5032 Chronic diastolic (congestive) heart failure: Secondary | ICD-10-CM | POA: Diagnosis not present

## 2021-03-26 DIAGNOSIS — J449 Chronic obstructive pulmonary disease, unspecified: Secondary | ICD-10-CM | POA: Diagnosis not present

## 2021-03-26 DIAGNOSIS — I340 Nonrheumatic mitral (valve) insufficiency: Secondary | ICD-10-CM | POA: Diagnosis not present

## 2021-03-26 DIAGNOSIS — Z833 Family history of diabetes mellitus: Secondary | ICD-10-CM | POA: Diagnosis not present

## 2021-03-26 DIAGNOSIS — W010XXA Fall on same level from slipping, tripping and stumbling without subsequent striking against object, initial encounter: Secondary | ICD-10-CM | POA: Diagnosis not present

## 2021-03-26 DIAGNOSIS — Z880 Allergy status to penicillin: Secondary | ICD-10-CM | POA: Diagnosis not present

## 2021-03-26 DIAGNOSIS — I517 Cardiomegaly: Secondary | ICD-10-CM | POA: Diagnosis not present

## 2021-03-26 DIAGNOSIS — E559 Vitamin D deficiency, unspecified: Secondary | ICD-10-CM | POA: Diagnosis present

## 2021-03-26 DIAGNOSIS — I10 Essential (primary) hypertension: Secondary | ICD-10-CM | POA: Diagnosis present

## 2021-03-26 DIAGNOSIS — S72001D Fracture of unspecified part of neck of right femur, subsequent encounter for closed fracture with routine healing: Secondary | ICD-10-CM | POA: Diagnosis not present

## 2021-03-26 DIAGNOSIS — K219 Gastro-esophageal reflux disease without esophagitis: Secondary | ICD-10-CM | POA: Diagnosis present

## 2021-03-26 DIAGNOSIS — Z887 Allergy status to serum and vaccine status: Secondary | ICD-10-CM | POA: Diagnosis not present

## 2021-03-26 DIAGNOSIS — Z86010 Personal history of colonic polyps: Secondary | ICD-10-CM | POA: Diagnosis not present

## 2021-03-26 DIAGNOSIS — Z888 Allergy status to other drugs, medicaments and biological substances status: Secondary | ICD-10-CM | POA: Diagnosis not present

## 2021-03-26 DIAGNOSIS — M25552 Pain in left hip: Secondary | ICD-10-CM | POA: Diagnosis not present

## 2021-03-26 DIAGNOSIS — Z86718 Personal history of other venous thrombosis and embolism: Secondary | ICD-10-CM | POA: Diagnosis not present

## 2021-03-26 DIAGNOSIS — J9811 Atelectasis: Secondary | ICD-10-CM | POA: Diagnosis not present

## 2021-03-26 DIAGNOSIS — D6859 Other primary thrombophilia: Secondary | ICD-10-CM | POA: Diagnosis not present

## 2021-03-26 DIAGNOSIS — S72001A Fracture of unspecified part of neck of right femur, initial encounter for closed fracture: Secondary | ICD-10-CM | POA: Diagnosis not present

## 2021-03-26 DIAGNOSIS — Z20822 Contact with and (suspected) exposure to covid-19: Secondary | ICD-10-CM | POA: Diagnosis not present

## 2021-03-26 DIAGNOSIS — I361 Nonrheumatic tricuspid (valve) insufficiency: Secondary | ICD-10-CM | POA: Diagnosis not present

## 2021-03-26 DIAGNOSIS — M810 Age-related osteoporosis without current pathological fracture: Secondary | ICD-10-CM | POA: Diagnosis present

## 2021-03-26 DIAGNOSIS — Z96641 Presence of right artificial hip joint: Secondary | ICD-10-CM | POA: Diagnosis not present

## 2021-03-26 DIAGNOSIS — F32A Depression, unspecified: Secondary | ICD-10-CM | POA: Diagnosis present

## 2021-03-26 DIAGNOSIS — Z8262 Family history of osteoporosis: Secondary | ICD-10-CM | POA: Diagnosis not present

## 2021-03-26 DIAGNOSIS — Z66 Do not resuscitate: Secondary | ICD-10-CM | POA: Diagnosis not present

## 2021-03-26 DIAGNOSIS — Z8249 Family history of ischemic heart disease and other diseases of the circulatory system: Secondary | ICD-10-CM | POA: Diagnosis not present

## 2021-03-26 DIAGNOSIS — S72012A Unspecified intracapsular fracture of left femur, initial encounter for closed fracture: Secondary | ICD-10-CM | POA: Diagnosis not present

## 2021-03-26 DIAGNOSIS — G4733 Obstructive sleep apnea (adult) (pediatric): Secondary | ICD-10-CM | POA: Diagnosis present

## 2021-03-26 DIAGNOSIS — Y92000 Kitchen of unspecified non-institutional (private) residence as  the place of occurrence of the external cause: Secondary | ICD-10-CM | POA: Diagnosis not present

## 2021-03-26 DIAGNOSIS — I2510 Atherosclerotic heart disease of native coronary artery without angina pectoris: Secondary | ICD-10-CM | POA: Diagnosis not present

## 2021-03-27 ENCOUNTER — Telehealth: Payer: Self-pay | Admitting: Internal Medicine

## 2021-03-27 NOTE — Telephone Encounter (Signed)
Received a phone call from Facility:  [facility] Requesting MD:  Cleatrice Burke Patient with h/o HTN; afib; CAD; hypothyroidism; and stage 3 CKD presenting with L hip fracture.  Fall with L femur fracture, family has requested transfer to [facility]; Dr. Mardelle Matte did prior R surgery and would like him/his group to do surgery again.   Plan of care:  Surgery will occur Monday afternoon by Dr. Mardelle Matte.  Orthopedics on call ([place]) will consult over the weekend.  She is on Eliquis and will need to transition to Heparin drip over the weekend to allow for Eliquis washout.  I have asked the transferring physician to initiate the Heparin drip prior to transfer and while she is awaiting a bed. Will accept patient in transfer to Med Surg bed in admission status.  Orthopedics ([place]) will be consulted by admitting physician upon patient arrival.     Nursing staff, Please call the [place] number at the top of Amion at the time of the patient's arrival so that the patient can be paged to the admitting physician.   Carlyon Shadow, M.D. [facility]

## 2021-03-28 ENCOUNTER — Inpatient Hospital Stay (HOSPITAL_COMMUNITY)
Admission: AD | Admit: 2021-03-28 | Discharge: 2021-04-02 | DRG: 481 | Disposition: A | Discharge status: Rehabilitation Facility | Payer: Medicare Other | Source: Other Acute Inpatient Hospital | Attending: Family Medicine | Admitting: Family Medicine

## 2021-03-28 ENCOUNTER — Other Ambulatory Visit: Payer: Self-pay

## 2021-03-28 ENCOUNTER — Encounter (HOSPITAL_COMMUNITY): Payer: Self-pay | Admitting: Internal Medicine

## 2021-03-28 ENCOUNTER — Inpatient Hospital Stay (HOSPITAL_COMMUNITY): Payer: Medicare Other

## 2021-03-28 DIAGNOSIS — Z880 Allergy status to penicillin: Secondary | ICD-10-CM

## 2021-03-28 DIAGNOSIS — Z888 Allergy status to other drugs, medicaments and biological substances status: Secondary | ICD-10-CM | POA: Diagnosis not present

## 2021-03-28 DIAGNOSIS — N1831 Chronic kidney disease, stage 3a: Secondary | ICD-10-CM | POA: Diagnosis present

## 2021-03-28 DIAGNOSIS — K219 Gastro-esophageal reflux disease without esophagitis: Secondary | ICD-10-CM | POA: Diagnosis present

## 2021-03-28 DIAGNOSIS — E871 Hypo-osmolality and hyponatremia: Secondary | ICD-10-CM | POA: Diagnosis not present

## 2021-03-28 DIAGNOSIS — I130 Hypertensive heart and chronic kidney disease with heart failure and stage 1 through stage 4 chronic kidney disease, or unspecified chronic kidney disease: Secondary | ICD-10-CM | POA: Diagnosis present

## 2021-03-28 DIAGNOSIS — F32A Depression, unspecified: Secondary | ICD-10-CM | POA: Diagnosis present

## 2021-03-28 DIAGNOSIS — Z66 Do not resuscitate: Secondary | ICD-10-CM | POA: Diagnosis present

## 2021-03-28 DIAGNOSIS — Z20822 Contact with and (suspected) exposure to covid-19: Secondary | ICD-10-CM | POA: Diagnosis present

## 2021-03-28 DIAGNOSIS — S72002A Fracture of unspecified part of neck of left femur, initial encounter for closed fracture: Principal | ICD-10-CM | POA: Diagnosis present

## 2021-03-28 DIAGNOSIS — Z7901 Long term (current) use of anticoagulants: Secondary | ICD-10-CM

## 2021-03-28 DIAGNOSIS — R609 Edema, unspecified: Secondary | ICD-10-CM | POA: Diagnosis not present

## 2021-03-28 DIAGNOSIS — W1809XA Striking against other object with subsequent fall, initial encounter: Secondary | ICD-10-CM | POA: Diagnosis present

## 2021-03-28 DIAGNOSIS — I272 Pulmonary hypertension, unspecified: Secondary | ICD-10-CM | POA: Diagnosis present

## 2021-03-28 DIAGNOSIS — Z79890 Hormone replacement therapy (postmenopausal): Secondary | ICD-10-CM

## 2021-03-28 DIAGNOSIS — S72002D Fracture of unspecified part of neck of left femur, subsequent encounter for closed fracture with routine healing: Secondary | ICD-10-CM | POA: Diagnosis not present

## 2021-03-28 DIAGNOSIS — I2510 Atherosclerotic heart disease of native coronary artery without angina pectoris: Secondary | ICD-10-CM | POA: Diagnosis present

## 2021-03-28 DIAGNOSIS — I5032 Chronic diastolic (congestive) heart failure: Secondary | ICD-10-CM | POA: Diagnosis present

## 2021-03-28 DIAGNOSIS — I480 Paroxysmal atrial fibrillation: Secondary | ICD-10-CM | POA: Diagnosis present

## 2021-03-28 DIAGNOSIS — Z86718 Personal history of other venous thrombosis and embolism: Secondary | ICD-10-CM

## 2021-03-28 DIAGNOSIS — Z885 Allergy status to narcotic agent status: Secondary | ICD-10-CM

## 2021-03-28 DIAGNOSIS — Z681 Body mass index (BMI) 19 or less, adult: Secondary | ICD-10-CM | POA: Diagnosis not present

## 2021-03-28 DIAGNOSIS — N312 Flaccid neuropathic bladder, not elsewhere classified: Secondary | ICD-10-CM | POA: Diagnosis present

## 2021-03-28 DIAGNOSIS — Z823 Family history of stroke: Secondary | ICD-10-CM

## 2021-03-28 DIAGNOSIS — Z419 Encounter for procedure for purposes other than remedying health state, unspecified: Secondary | ICD-10-CM

## 2021-03-28 DIAGNOSIS — E559 Vitamin D deficiency, unspecified: Secondary | ICD-10-CM | POA: Diagnosis present

## 2021-03-28 DIAGNOSIS — S72045A Nondisplaced fracture of base of neck of left femur, initial encounter for closed fracture: Secondary | ICD-10-CM | POA: Diagnosis not present

## 2021-03-28 DIAGNOSIS — G4733 Obstructive sleep apnea (adult) (pediatric): Secondary | ICD-10-CM | POA: Diagnosis present

## 2021-03-28 DIAGNOSIS — Z01810 Encounter for preprocedural cardiovascular examination: Secondary | ICD-10-CM

## 2021-03-28 DIAGNOSIS — E039 Hypothyroidism, unspecified: Secondary | ICD-10-CM | POA: Diagnosis present

## 2021-03-28 DIAGNOSIS — J449 Chronic obstructive pulmonary disease, unspecified: Secondary | ICD-10-CM | POA: Diagnosis present

## 2021-03-28 DIAGNOSIS — T148XXA Other injury of unspecified body region, initial encounter: Secondary | ICD-10-CM | POA: Diagnosis not present

## 2021-03-28 DIAGNOSIS — N183 Chronic kidney disease, stage 3 unspecified: Secondary | ICD-10-CM | POA: Diagnosis present

## 2021-03-28 DIAGNOSIS — S72001A Fracture of unspecified part of neck of right femur, initial encounter for closed fracture: Secondary | ICD-10-CM | POA: Diagnosis not present

## 2021-03-28 DIAGNOSIS — S72302D Unspecified fracture of shaft of left femur, subsequent encounter for closed fracture with routine healing: Secondary | ICD-10-CM | POA: Diagnosis not present

## 2021-03-28 DIAGNOSIS — I341 Nonrheumatic mitral (valve) prolapse: Secondary | ICD-10-CM | POA: Diagnosis not present

## 2021-03-28 DIAGNOSIS — Z951 Presence of aortocoronary bypass graft: Secondary | ICD-10-CM

## 2021-03-28 DIAGNOSIS — K900 Celiac disease: Secondary | ICD-10-CM | POA: Diagnosis present

## 2021-03-28 DIAGNOSIS — D6859 Other primary thrombophilia: Secondary | ICD-10-CM | POA: Diagnosis present

## 2021-03-28 DIAGNOSIS — M818 Other osteoporosis without current pathological fracture: Secondary | ICD-10-CM | POA: Diagnosis not present

## 2021-03-28 DIAGNOSIS — M810 Age-related osteoporosis without current pathological fracture: Secondary | ICD-10-CM | POA: Diagnosis present

## 2021-03-28 DIAGNOSIS — S7292XD Unspecified fracture of left femur, subsequent encounter for closed fracture with routine healing: Secondary | ICD-10-CM | POA: Diagnosis not present

## 2021-03-28 DIAGNOSIS — F5104 Psychophysiologic insomnia: Secondary | ICD-10-CM | POA: Diagnosis present

## 2021-03-28 DIAGNOSIS — D62 Acute posthemorrhagic anemia: Secondary | ICD-10-CM | POA: Diagnosis not present

## 2021-03-28 DIAGNOSIS — Z8781 Personal history of (healed) traumatic fracture: Secondary | ICD-10-CM

## 2021-03-28 DIAGNOSIS — G2581 Restless legs syndrome: Secondary | ICD-10-CM | POA: Diagnosis present

## 2021-03-28 DIAGNOSIS — I10 Essential (primary) hypertension: Secondary | ICD-10-CM | POA: Diagnosis not present

## 2021-03-28 DIAGNOSIS — W1830XD Fall on same level, unspecified, subsequent encounter: Secondary | ICD-10-CM | POA: Diagnosis not present

## 2021-03-28 DIAGNOSIS — E440 Moderate protein-calorie malnutrition: Secondary | ICD-10-CM | POA: Diagnosis not present

## 2021-03-28 DIAGNOSIS — Z8249 Family history of ischemic heart disease and other diseases of the circulatory system: Secondary | ICD-10-CM

## 2021-03-28 DIAGNOSIS — Z9889 Other specified postprocedural states: Secondary | ICD-10-CM | POA: Diagnosis not present

## 2021-03-28 DIAGNOSIS — G8929 Other chronic pain: Secondary | ICD-10-CM | POA: Diagnosis present

## 2021-03-28 DIAGNOSIS — Z79899 Other long term (current) drug therapy: Secondary | ICD-10-CM | POA: Diagnosis not present

## 2021-03-28 DIAGNOSIS — E8809 Other disorders of plasma-protein metabolism, not elsewhere classified: Secondary | ICD-10-CM | POA: Diagnosis not present

## 2021-03-28 DIAGNOSIS — G629 Polyneuropathy, unspecified: Secondary | ICD-10-CM | POA: Diagnosis present

## 2021-03-28 HISTORY — DX: Do not resuscitate: Z66

## 2021-03-28 LAB — SARS CORONAVIRUS 2 (TAT 6-24 HRS): SARS Coronavirus 2: NEGATIVE

## 2021-03-28 LAB — SURGICAL PCR SCREEN
MRSA, PCR: NEGATIVE
Staphylococcus aureus: NEGATIVE

## 2021-03-28 MED ORDER — DOCUSATE SODIUM 100 MG PO CAPS
100.0000 mg | ORAL_CAPSULE | Freq: Two times a day (BID) | ORAL | Status: DC
  Administered 2021-03-28 – 2021-03-29 (×3): 100 mg via ORAL
  Filled 2021-03-28 (×3): qty 1

## 2021-03-28 MED ORDER — METHOCARBAMOL 1000 MG/10ML IJ SOLN
500.0000 mg | Freq: Four times a day (QID) | INTRAVENOUS | Status: DC | PRN
  Filled 2021-03-28: qty 5

## 2021-03-28 MED ORDER — MORPHINE SULFATE (PF) 2 MG/ML IV SOLN: 0.5000 mg | INTRAVENOUS | Status: DC | PRN

## 2021-03-28 MED ORDER — MELATONIN 3 MG PO TABS
3.0000 mg | ORAL_TABLET | Freq: Every day | ORAL | Status: DC
  Administered 2021-03-28 – 2021-04-01 (×5): 3 mg via ORAL
  Filled 2021-03-28 (×5): qty 1

## 2021-03-28 MED ORDER — BISACODYL 5 MG PO TBEC: 5.0000 mg | DELAYED_RELEASE_TABLET | Freq: Every day | ORAL | Status: DC | PRN

## 2021-03-28 MED ORDER — AMIODARONE HCL 200 MG PO TABS
100.0000 mg | ORAL_TABLET | Freq: Every day | ORAL | Status: DC
  Administered 2021-03-28 – 2021-04-01 (×5): 100 mg via ORAL
  Filled 2021-03-28 (×5): qty 1

## 2021-03-28 MED ORDER — AMIODARONE HCL 200 MG PO TABS: 100.0000 mg | ORAL_TABLET | Freq: Every day | ORAL | Status: DC

## 2021-03-28 MED ORDER — AMLODIPINE BESYLATE 2.5 MG PO TABS
2.5000 mg | ORAL_TABLET | Freq: Every day | ORAL | Status: DC
  Administered 2021-03-28 – 2021-04-02 (×5): 2.5 mg via ORAL
  Filled 2021-03-28 (×6): qty 1

## 2021-03-28 MED ORDER — LEVOTHYROXINE SODIUM 25 MCG PO TABS
25.0000 ug | ORAL_TABLET | Freq: Every day | ORAL | Status: DC
  Administered 2021-03-29 – 2021-04-02 (×5): 25 ug via ORAL
  Filled 2021-03-28 (×5): qty 1

## 2021-03-28 MED ORDER — HEPARIN (PORCINE) 25000 UT/250ML-% IV SOLN
850.0000 [IU]/h | INTRAVENOUS | Status: DC
  Administered 2021-03-28: 600 [IU]/h via INTRAVENOUS
  Administered 2021-03-30: 850 [IU]/h via INTRAVENOUS
  Filled 2021-03-28 (×2): qty 250

## 2021-03-28 MED ORDER — METHOCARBAMOL 500 MG PO TABS
500.0000 mg | ORAL_TABLET | Freq: Four times a day (QID) | ORAL | Status: DC | PRN
  Administered 2021-03-29: 500 mg via ORAL
  Filled 2021-03-28: qty 1

## 2021-03-28 MED ORDER — HYDROCODONE-ACETAMINOPHEN 5-325 MG PO TABS: 1.0000 | ORAL_TABLET | Freq: Four times a day (QID) | ORAL | Status: DC | PRN

## 2021-03-28 MED ORDER — POLYETHYLENE GLYCOL 3350 17 G PO PACK: 17.0000 g | PACK | Freq: Every day | ORAL | Status: DC | PRN

## 2021-03-28 NOTE — Plan of Care (Signed)
Received report from [facility].  Pt arrived to unit via ambulance on a stretcher. Pt transferred to bed, CHG bath done, skin assessment performed, foam dressing applied to sacrum. Continued heparin infusion, rate per report is 10.75m/hr. Pt on prevalon boot to both LE.  Pt complained of pain during transfer but now denies pain. Pt alert and oriented x 4, resting comfortably in bed. MD at bedside.   Problem: Clinical Measurements: Goal: Ability to maintain clinical measurements within normal limits will improve Outcome: Progressing   Problem: Activity: Goal: Risk for activity intolerance will decrease Outcome: Progressing   Problem: Nutrition: Goal: Adequate nutrition will be maintained Outcome: Progressing   Problem: Coping: Goal: Level of anxiety will decrease Outcome: Progressing   Problem: Elimination: Goal: Will not experience complications related to bowel motility Outcome: Progressing   Problem: Pain Managment: Goal: General experience of comfort will improve Outcome: Progressing   Problem: Safety: Goal: Ability to remain free from injury will improve Outcome: Progressing   Problem: Skin Integrity: Goal: Risk for impaired skin integrity will decrease Outcome: Progressing

## 2021-03-28 NOTE — Progress Notes (Signed)
ANTICOAGULATION CONSULT NOTE - Initial Consult  Pharmacy Consult for heparin Indication: atrial fibrillation  Allergies  Allergen Reactions  . Fentanyl Nausea And Vomiting    Severe n/v  . Alendronate Sodium Other (See Comments)    Jaw pain  . Amoxicillin Other (See Comments)    Nausea, dizziness Nausea, dizziness  . Codeine Nausea And Vomiting  . Fosamax [Alendronate Sodium] Other (See Comments)    Jaw pain  . Gluten Meal Other (See Comments)    Celiac Disease Celiac Disease  . Hydrochlorothiazide Other (See Comments)    Hyponatremia, GI upset Hyponatremia, GI upset  . Tetanus Toxoid, Adsorbed Other (See Comments)    Bad local reaction  . Tetanus Toxoids Other (See Comments)    Bad local reaction    Patient Measurements:   Heparin Dosing Weight: 45kg  Vital Signs: Temp: 97.7 F (36.5 C) (06/04 1638) Temp Source: Oral (06/04 1638) BP: 128/48 (06/04 1638) Pulse Rate: 70 (06/04 1638)  Labs: No results for input(s): HGB, HCT, PLT, APTT, LABPROT, INR, HEPARINUNFRC, HEPRLOWMOCWT, CREATININE, CKTOTAL, CKMB, TROPONINIHS in the last 72 hours.  CrCl cannot be calculated (Patient's most recent lab result is older than the maximum 21 days allowed.).   Medical History: Past Medical History:  Diagnosis Date  . Anemia   . Atonic bladder 12/11/2013  . Atrial fibrillation ([place])   . Carotid artery occlusion    right carotid stenosis 40-60%, 4/08, neg for stenosis repeat study 11/11  . Celiac disease   . Chronic anticoagulation CHADS2VASC2 score 7 10/18/2014  . Chronic diastolic CHF (congestive heart failure) (HCC)    takes Lasix daily  . CKD (chronic kidney disease), stage III ([place])   . Coronary artery disease    a.  08/2013 (LIMA-LAD, rSVG to LCX and Lt atrial clip),  . Depression   . DVT (deep venous thrombosis) ([place]) 09/2013  . Gastric ulcer    6-584month ago  . GERD (gastroesophageal reflux disease)    takes Omeprazole daily  . GI bleeding   . Hemorrhoids   . Hot  flashes    takes ZOloft 3 times a week  . Hypertension   . Hypothyroidism    takes Synthroid daily as result of AMiodarone  . Insomnia   . Macular degeneration   . Mild aortic insufficiency    a. Echo 10/2013: mild AI.  .Marland KitchenModerate mitral regurgitation by prior echocardiogram    a. Echo 10/2013.  . Moderate obstructive sleep apnea    uses CPAP;sleep study about 4-550monthago  . MVP (mitral valve prolapse)   . Osteoporosis   . PAF (paroxysmal atrial fibrillation) ([place]  . Peripheral edema    left  . Presbycusis   . Pulmonary hypertension ([facility]  . RLS (restless legs syndrome)   . Thyroid nodule    84m23mno change 11/11  . Urethral stricture   . Urinary anastomotic stricture   . Urinary retention   . Vitamin D deficiency    Assessment: [age]F presenting from outside hospital with hip fx, hx afib on heparin gtt, PTA on Eliquis (held since 6/2).    Goal of Therapy:  Heparin level 0.3-0.7 units/ml aPTT 66-102 seconds Monitor platelets by anticoagulation protocol: Yes   Plan:  Heparin gtt at 600 units/hr F/u 8 hour aPTT/HL F/u ortho plans and ability to transition to lovenox  JonBertis RuddyharmD Clinical Pharmacist ED Pharmacist Phone # [phone]/2022 5:57 PM

## 2021-03-28 NOTE — H&P (Signed)
History and Physical    Maria [name] UUV:[id number] DOB: 23-Jan-1924 DOA: 03/28/2021  PCP: [name], MD Consultants:  Mardelle Matte - orthopedics Patient coming from: [facility]; lives alone in a retirement village at [place]; [place]: Abbie Sons, [place]  Chief Complaint: hip fracture  HPI: Maria [name] is a 87 y.o. female with medical history significant of HTN; afib on Eliquis; CAD; OSA on CPAP; hypothyroidism; CAD s/p CABG; celiac disease; chronic diastolic CHF; and stage 3a CKD presenting with L hip fracture.  She was at [facility].  [place] are renov[place] their [place] and went shopping for things to decorate.  She was carrying things into the house and stepped into the house and stepped onto a rug and it slipped out from under her; they had bought it and it did not have a backing on it.  She landed on her L hip.  She previously had a R hip repair in about 2014.  She is "settling down" now, the ambulance ride was not very comfortable.  We discussed rehab and it is ideal that she lives at [facility].  However, if possible she prefers to go to CIR instead.    ED Course:  [facility] to [facility] transfer:  Fall with L femur fracture, family has requested transfer to [facility]; Dr. Mardelle Matte did prior R surgery and would like him/his group to do surgery again.   Plan of care:  Surgery will occur Monday afternoon by Dr. Mardelle Matte.  Orthopedics on call ([place]) will consult over the weekend.  She is on Eliquis and will need to transition to Heparin drip over the weekend to allow for Eliquis washout.     Review of Systems: As per HPI; otherwise review of systems reviewed and negative.   Ambulatory Status:  Ambulates without assistance  COVID Vaccine Status:  Complete  Past Medical History:  Diagnosis Date  . Anemia   . Atonic bladder 12/11/2013  . Carotid artery occlusion    right carotid stenosis 40-60%, 4/08, neg for stenosis repeat study 11/11   . Celiac disease   . Chronic anticoagulation CHADS2VASC2 score 7 10/18/2014  . Chronic diastolic CHF (congestive heart failure) (HCC)    takes Lasix daily  . CKD (chronic kidney disease), stage III ([place])   . Coronary artery disease    a.  08/2013 (LIMA-LAD, rSVG to LCX and Lt atrial clip),  . Depression   . DNR (do not resuscitate) 03/28/2021  . DVT (deep venous thrombosis) ([place]) 09/2013  . Gastric ulcer    6-376month ago  . GERD (gastroesophageal reflux disease)    takes Omeprazole daily  . GI bleeding   . Hemorrhoids   . Hot flashes    takes ZOloft 3 times a week  . Hypertension   . Hypothyroidism    takes Synthroid daily as result of AMiodarone  . Insomnia   . Macular degeneration   . Mild aortic insufficiency    a. Echo 10/2013: mild AI.  .Marland KitchenModerate mitral regurgitation by prior echocardiogram    a. Echo 10/2013.  . Moderate obstructive sleep apnea    uses CPAP;sleep study about 4-542monthago  . MVP (mitral valve prolapse)   . Osteoporosis   . PAF (paroxysmal atrial fibrillation) ([place]  . Peripheral edema    left  . Presbycusis   . Pulmonary hypertension ([facility]  . RLS (restless legs syndrome)   . Thyroid nodule    76m55mno change 11/11  . Urethral stricture   .  Urinary anastomotic stricture   . Urinary retention   . Vitamin D deficiency     Past Surgical History:  Procedure Laterality Date  . APPENDECTOMY    . bilateral cataract surgery    . CARDIAC CATHETERIZATION  08-06-13  . CORONARY ARTERY BYPASS GRAFT N/A 08/28/2013   Procedure: CORONARY ARTERY BYPASS GRAFTING (CABG) x2 using right greater saphenous vein and left internal mammary artery. ;  Surgeon: Grace Isaac, MD;  Location: [place];  Service: Open Heart Surgery;  Laterality: N/A;  . HIP ARTHROPLASTY Right 12/09/2013   Procedure: ARTHROPLASTY BIPOLAR HIP CEMENTED;  Surgeon: Kerin Salen, MD;  Location: [place];  Service: Orthopedics;  Laterality: Right;  . INTRAOPERATIVE TRANSESOPHAGEAL ECHOCARDIOGRAM  N/A 08/28/2013   Procedure: INTRAOPERATIVE TRANSESOPHAGEAL ECHOCARDIOGRAM;  Surgeon: Grace Isaac, MD;  Location: [place];  Service: Open Heart Surgery;  Laterality: N/A;  . LEFT AND RIGHT HEART CATHETERIZATION WITH CORONARY ANGIOGRAM N/A 08/16/2013   Procedure: LEFT AND RIGHT HEART CATHETERIZATION WITH CORONARY ANGIOGRAM;  Surgeon: [name], MD;  Location: [facility] C[facility] LAB;  Service: Cardiovascular;  Laterality: N/A;  . MANDIBLE SURGERY    . TCS    . TONSILLECTOMY    . trigger thumb    . TUBAL LIGATION      Social History   Socioeconomic History  . Marital status: Widowed    Spouse name: Not on file  . Number of children: Not on file  . Years of education: Not on file  . Highest education level: Not on file  Occupational History  . Occupation: retired  Tobacco Use  . Smoking status: Never Smoker  . Smokeless tobacco: Never Used  Vaping Use  . Vaping Use: Never used  Substance and Sexual Activity  . Alcohol use: No  . Drug use: No  . Sexual activity: Yes    Birth control/protection: Surgical  Other Topics Concern  . Not on file  Social History Narrative   Lives in retirement community   Right handed   Social Determinants of Health   Financial Resource Strain: Not on file  Food Insecurity: Not on file  Transportation Needs: Not on file  Physical Activity: Not on file  Stress: Not on file  Social Connections: Not on file  Intimate Partner Violence: Not on file    Allergies  Allergen Reactions  . Fentanyl Nausea And Vomiting    Severe n/v  . Amoxicillin Nausea Only and Other (See Comments)    dizziness  . Atorvastatin Other (See Comments)    Unknown reaction - reported by [facility]  . Bupropion Other (See Comments)    nightmares  . Codeine Nausea And Vomiting  . Fosamax [Alendronate Sodium] Other (See Comments)    Jaw pain  . Gabapentin Other (See Comments)    dizziness  . Gluten Meal Other (See Comments)    Celiac Disease   . Hydrochlorothiazide Other  (See Comments)    Hyponatremia, GI upset   . Tetanus Toxoid, Adsorbed Swelling    Severe swelling at site of injection (pt tolerates 1/2 injection then 1/2 week later)    Family History  Problem Relation Age of Onset  . Heart attack Mother   . CVA Mother   . CAD Father   . Colon cancer Father   . Heart attack Father   . Heart attack Brother   . Peripheral vascular disease Brother   . AAA (abdominal aortic aneurysm) Brother     Prior to Admission medications   Medication  Sig Start Date End Date Taking? Authorizing Provider  acetaminophen (TYLENOL) 500 MG tablet Take 1,000 mg by mouth every 6 (six) hours as needed.    [provider]  amiodarone (PACERONE) 200 MG tablet Take 0.5 tablets (100 mg total) by mouth daily. 04/22/20   Belva Crome, MD  amLODipine (NORVASC) 2.5 MG tablet Take 1 tablet (2.5 mg total) by mouth daily for 30 days. 11/28/18 03/27/20  Amin, Jeanella Flattery, MD  apixaban (ELIQUIS) 2.5 MG TABS tablet Take 1 tablet (2.5 mg total) by mouth 2 (two) times daily for 30 days. 11/28/18 03/27/20  Amin, Jeanella Flattery, MD  furosemide (LASIX) 20 MG tablet Take 1 tablet (20 mg total) by mouth daily as needed for fluid or edema. 11/30/18   Geradine Girt, DO  levothyroxine (SYNTHROID) 50 MCG tablet Take 25 mcg by mouth daily before breakfast.    [provider]  Magnesium 250 MG TABS Take 500 mg by mouth daily.     [provider]  Melatonin CR 3 MG TBCR Take 3 mg by mouth at bedtime.    [provider]  MULTIPLE VITAMIN PO Take 1 tablet by mouth daily.    [provider]  Vitamin D, Cholecalciferol, 25 MCG (1000 UT) TABS Take 2 tablets by mouth daily.    [provider]    Physical Exam: Vitals:   03/28/21 1638  BP: (!) 128/48  Pulse: 70  Resp: 16  Temp: 97.7 F (36.5 C)  TempSrc: Oral  SpO2: 93%     . General:  Appears calm and comfortable and is in NAD . Eyes:   EOMI, normal lids, iris . ENT:  grossly normal hearing, lips  & tongue, mmm; appropriate dentition . Neck:  no LAD, masses or thyromegaly . Cardiovascular:  RRR, no m/r/g. No LE edema.  Marland Kitchen Respiratory:   CTA bilaterally with no wheezes/rales/rhonchi.  Normal respiratory effort. . Abdomen:  soft, NT, ND . Skin:  no rash or induration seen on limited exam . Musculoskeletal:  L hip mild TTP, L foot is mild shortened but both are in protective boots . Psychiatric:  grossly normal mood and affect, speech fluent and appropriate, AOx3 . Neurologic:  CN 2-12 grossly intact, moves all extremities in coordinated fashion other than painful L hip    Radiological Exams on Admission: Independently reviewed - see discussion in A/P where applicable  Hip CT with nondisplaced L femoral neck fracture   EKG: pending   Labs on Admission: I have personally reviewed the available labs and imaging studies at the time of the admission.  Pertinent labs:   WBC 6.8 Hgb 12.1 Platelets 302 Unremarkable BMP AST 42/ALT 18/Bili 0.6 INR 1.06   Assessment/Plan Principal Problem:   Closed left hip fracture, initial encounter (HCC) Active Problems:   Essential hypertension, benign   Paroxysmal atrial fibrillation (HCC)   Hypothyroidism   CKD (chronic kidney disease), stage III (HCC)   Celiac disease   DNR (do not resuscitate)   L Hip Fracture -Mechanical fall resulting in hip fracture -Orthopedics consulted; plan for operative repair on 6/6 -NPO after midnight tomorrow in anticipation of surgical repair Monday -Heparin for now, start Lovenox post-operatively (or as per ortho) -CXR and EKG prior to surgery -Pain control with Robxain, Vicodin, and Morphine prn -TOC team consult for rehab placement - prefers [place] if possible -Will need PT consult post-operatively -Hip fracture order set utilized  Pre-operative clearance -Orthopedic/spinal surgery is associated with an intermediate (1-5%)  cardiovascular risk for cardiac death and nonfatal MI -With  her h/o CHF and CAD, her revised cardiac index gives a risk estimate of 10.1% -Because of this risk, she is recommended to have pre-operative EKG testing prior to surgery; this is pending -Her Detsky's Modified Cardiac Risk Index score is moderate -Gven her moderate functional capacity, it is reasonable for her to go to the OR without additional evaluation assuming unremarkable EKG  Afib -Rate controlled with Amiodarone, continued -Eliquis has been held since Thursday night; on Heparin drip  HTN -Continue Norvasc  OSA -Continue CPAP  Hypothyroidism -Continue Synthroid at current dose for now  Celiac disease -Gluten free diet  Chronic diastolic CHF -Appears to be compensated at this time  Stage 3a CKD -Appears to be stable at this time -Recheck BMP in AM  DNR -I have discussed code status with the patient and she would not desire resuscitation and would prefer to die a natural death should that situation arise; however, she would like to rescind this order while she is in the OR in case she has a reversible condition associated with anesthesia. -She will need a gold out of facility DNR form at the time of discharge     DVT prophylaxis:  Heparin drip until approved for Lovenox by orthopedics Code Status:  DNR - confirmed with patient Family Communication: None present  Disposition Plan:  CIR vs. SNF rehab at [employer] called: Orthopedics; TOC team, Nutrition; will need PT post-operatively  Admission status: Admit - It is my clinical opinion that admission to INPATIENT is reasonable and necessary because of the expectation that this patient will require hospital care that crosses at least 2 midnights to treat this condition based on the medical complexity of the problems presented.  Given the aforementioned information, the predictability of an adverse outcome is felt to be significant.    Karmen Bongo MD [facility]   How to contact the [facility]  Attending or Consulting provider [place] or covering provider during after hours [place], for this patient?  1. Check the care team in [facility] and look for a) attending/consulting [facility] provider listed and b) the [facility] team listed 2. Log into [email or web address] and use [place]'s universal password to access. If you do not have the password, please contact the hospital operator. 3. Locate the [facility] provider you are looking for under [facility] and page to a number that you can be directly reached. 4. If you still have difficulty reaching the provider, please page the [facility] (Director on Call) for the Hospitalists listed on amion for assistance.   03/28/2021, 5:44 PM

## 2021-03-28 NOTE — Progress Notes (Signed)
Patient refused CPAP. Pt stated she has a CPAP at home but has not used it in years.

## 2021-03-29 ENCOUNTER — Inpatient Hospital Stay (HOSPITAL_COMMUNITY): Payer: Medicare Other

## 2021-03-29 DIAGNOSIS — I10 Essential (primary) hypertension: Secondary | ICD-10-CM

## 2021-03-29 DIAGNOSIS — K900 Celiac disease: Secondary | ICD-10-CM

## 2021-03-29 DIAGNOSIS — N183 Chronic kidney disease, stage 3 unspecified: Secondary | ICD-10-CM

## 2021-03-29 DIAGNOSIS — T148XXA Other injury of unspecified body region, initial encounter: Secondary | ICD-10-CM

## 2021-03-29 LAB — CBC
HCT: 33 % — ABNORMAL LOW (ref 36.0–46.0)
Hemoglobin: 11.1 g/dL — ABNORMAL LOW (ref 12.0–15.0)
MCH: 30.2 pg (ref 26.0–34.0)
MCHC: 33.6 g/dL (ref 30.0–36.0)
MCV: 89.7 fL (ref 80.0–100.0)
Platelets: 211 10*3/uL (ref 150–400)
RBC: 3.68 MIL/uL — ABNORMAL LOW (ref 3.87–5.11)
RDW: 15.1 % (ref 11.5–15.5)
WBC: 8 10*3/uL (ref 4.0–10.5)
nRBC: 0 % (ref 0.0–0.2)

## 2021-03-29 LAB — BASIC METABOLIC PANEL
Anion gap: 5 (ref 5–15)
BUN: 14 mg/dL (ref 8–23)
CO2: 24 mmol/L (ref 22–32)
Calcium: 8.3 mg/dL — ABNORMAL LOW (ref 8.9–10.3)
Chloride: 101 mmol/L (ref 98–111)
Creatinine, Ser: 0.73 mg/dL (ref 0.44–1.00)
GFR, Estimated: >60 mL/min (ref >60)
Glucose, Bld: 81 mg/dL (ref 70–99)
Potassium: 3.9 mmol/L (ref 3.5–5.1)
Sodium: 130 mmol/L — ABNORMAL LOW (ref 135–145)

## 2021-03-29 LAB — APTT
aPTT: 43 seconds — ABNORMAL HIGH (ref 24–36)
aPTT: 42 seconds — ABNORMAL HIGH (ref 24–36)

## 2021-03-29 LAB — HEPARIN LEVEL (UNFRACTIONATED)
Heparin Unfractionated: 0.39 IU/mL (ref 0.30–0.70)
Heparin Unfractionated: 0.32 IU/mL (ref 0.30–0.70)

## 2021-03-29 MED ORDER — ACETAMINOPHEN 325 MG PO TABS
650.0000 mg | ORAL_TABLET | ORAL | Status: DC | PRN
  Administered 2021-03-29 – 2021-03-30 (×3): 650 mg via ORAL
  Filled 2021-03-29 (×3): qty 2

## 2021-03-29 MED ORDER — SERTRALINE HCL 50 MG PO TABS
50.0000 mg | ORAL_TABLET | Freq: Every day | ORAL | Status: DC
  Administered 2021-03-29 – 2021-04-02 (×4): 50 mg via ORAL
  Filled 2021-03-29 (×5): qty 1

## 2021-03-29 MED ORDER — WHITE PETROLATUM EX OINT
TOPICAL_OINTMENT | CUTANEOUS | Status: AC
  Administered 2021-03-29: 0.2
  Filled 2021-03-29: qty 28.35

## 2021-03-29 NOTE — Progress Notes (Signed)
[facility]                                                                              Patient Demographics  Maria [name], is a 87 y.o. female, DOB - 11-Feb-1924, KGM:[id number]  Admit date - 03/28/2021   Admitting Physician Jonah Blue, MD  Outpatient Primary MD for the patient is Merlene Laughter, MD  Outpatient specialists:   LOS - 1  days   Medical records reviewed and are as summarized below:    chief complaint : Fall with hip fracture       Brief summary   Patient is a 87 year old female with history of hypertension, A. fib on Eliquis, CAD, OSA on CPAP, hypothyroidism, CAD status post CABG, celiac disease, chronic dCHF, stage IIIa CKD presented with left hip fracture.  Patient was at [facility]  They are [place] their beach [place] and when shopping for decoration.  She was getting things into the house and stepped onto a rug and it slipped out from under her.  Patient landed on her left hip.  She had a previous right hip repair in 2014.  Patient went to [facility], imaging showed left femur fracture.  X-rays of the left elbow did not show any fracture or dislocation.  CT C-spine did not show any fracture or dislocation. MD requested transfer to [facility].  Orthopedics consulted, eliquis has been placed on hold, pending surgery possibly tomorrow.  Assessment & Plan    Principal Problem: Mechanical fall with closed left hip fracture, initial encounter (HCC) -Orthopedics consulted, plan for operative repair on 6/6 -N.p.o. after midnight. -Eliquis currently on hold for surgery, continue heparin drip - will resume Eliquis once cleared by orthopedics after surgery -Continue pain control, bowel regimen  Active Problems: Paroxysmal atrial fibrillation with secondary thrombophilia -Rate controlled, continue amiodarone -Eliquis has been held for last 2 days.  Continue heparin drip.    Essential hypertension, benign -BP fairly stable,  continue Norvasc  OSA -Continue CPAP    Hypothyroidism -Continue Synthroid    CKD (chronic kidney disease), stage IIIa (HCC) -Creatinine currently at baseline, 0.7  Chronic diastolic CHF -Currently compensated, takes Lasix 20 mg daily -Echo in 11/2018 had shown EF of 60 to 65%  Code Status: DNR DVT Prophylaxis:  Heparin drip   Level of Care: Level of care: Med-Surg Family Communication: Discussed all imaging results, lab results, explained to the patient's daughter, Darrell Jewel on phone # [phone]   Disposition Plan:     Status is: Inpatient  Remains inpatient appropriate because:Inpatient level of care appropriate due to severity of illness   Dispo: The patient is from: Home              Anticipated d/c is to: TBD              Patient currently is not medically stable to d/c.   Difficult to place patient No    Time Spent in minutes 25 minutes  Procedures:  None  Consultants:   Orthopedics  Antimicrobials:   Anti-infectives (From admission, onward)   None          Medications  Scheduled Meds: . amiodarone  100 mg Oral QHS  . amLODipine  2.5 mg Oral Daily  . docusate sodium  100 mg Oral BID  . levothyroxine  25 mcg Oral QAC breakfast  . melatonin  3 mg Oral QHS  . sertraline  50 mg Oral Daily   Continuous Infusions: . heparin 700 Units/hr (03/29/21 0420)  . methocarbamol (ROBAXIN) IV     PRN Meds:.bisacodyl, HYDROcodone-acetaminophen, methocarbamol **OR** methocarbamol (ROBAXIN) IV, morphine injection, polyethylene glycol      Subjective:   Ikea Posten was seen and examined today.  Currently no complaints, pain controlled.  Had some pain on the right side of the neck, thinks pulled a muscle.  Patient denies dizziness, chest pain, shortness of breath, abdominal pain, nausea vomiting.  No acute events overnight   Objective:   Vitals:   03/28/21 1722 03/28/21 1857 03/28/21 2035 03/29/21 0753  BP:  (!) 118/53 (!) 106/58 (!) 151/73   Pulse:  67 68 68  Resp:   18 17  Temp:   98.2 F (36.8 C) 98.2 F (36.8 C)  TempSrc:   Oral Oral  SpO2:   97% 99%  Weight: 45.8 kg      No intake or output data in the 24 hours ending 03/29/21 1006   Wt Readings from Last 3 Encounters:  03/28/21 45.8 kg  03/27/20 49 kg  03/25/20 49.3 kg     Exam  General: Alert and oriented x 3, NAD  Cardiovascular: S1 S2 auscultated, RRR  Respiratory: CTA B  Gastrointestinal: Soft, nontender, nondistended, + bowel sounds  Ext: no pedal edema bilateraly  Neuro: no new FND's, exam limited due to left hip fracture  Musculoskeletal: No digital cyanosis, clubbing  Skin: No rashes  Psych: Normal affect and demeanor, alert and oriented x3    Data Reviewed:  I have personally reviewed following labs and imaging studies  Micro Results Recent Results (from the past 240 hour(s))  SARS CORONAVIRUS 2 (TAT 6-24 HRS) Nasopharyngeal Nasopharyngeal Swab     Status: None   Collection Time: 03/28/21  5:37 PM   Specimen: Nasopharyngeal Swab  Result Value Ref Range Status   SARS Coronavirus 2 NEGATIVE NEGATIVE Final    Comment: (NOTE) SARS-CoV-2 target nucleic acids are NOT DETECTED.  The SARS-CoV-2 RNA is generally detectable in upper and lower respiratory specimens during the acute phase of infection. Negative results do not preclude SARS-CoV-2 infection, do not rule out co-infections with other pathogens, and should not be used as the sole basis for treatment or other patient management decisions. Negative results must be combined with clinical observations, patient history, and epidemiological information. The expected result is Negative.  Fact Sheet for Patients: HairSlick.no  Fact Sheet for Healthcare Providers: quierodirigir.com  This test is not yet approved or cleared by the [place] FDA and  has been authorized for detection and/or diagnosis of SARS-CoV-2 by FDA  under an Emergency Use Authorization (EUA). This EUA will remain  in effect (meaning this test can be used) for the duration of the COVID-19 declaration under Se ction 564(b)(1) of the Act, 21 U.S.C. section 360bbb-3(b)(1), unless the authorization is terminated or revoked sooner.  Performed at [facility], [address], [place], [place] [address]   Surgical pcr screen     Status: None   Collection Time: 03/28/21  5:38 PM   Specimen: Nasal Mucosa; Nasal Swab  Result Value Ref Range Status   MRSA, PCR NEGATIVE NEGATIVE Final   Staphylococcus aureus NEGATIVE  NEGATIVE Final    Comment: (NOTE) The Xpert SA Assay (FDA approved for NASAL specimens in patients 51 years of age and older), is one component of a comprehensive surveillance program. It is not intended to diagnose infection nor to guide or monitor treatment. Performed at [facility], [address] [place], [place] [address]     Radiology Reports DG CHEST PORT 1 VIEW  Result Date: 03/28/2021 CLINICAL DATA:  Preoperative evaluation LEFT hip fracture EXAM: PORTABLE CHEST 1 VIEW COMPARISON:  Portable exam at 1839 hours compared to 03/27/2020 FINDINGS: Enlargement of cardiac silhouette post CABG and LEFT atrial appendage clipping. Mediastinal contours and pulmonary vascularity normal. Atherosclerotic calcification aorta. Emphysematous and bronchitic changes consistent with COPD. Small LEFT pleural effusion and bibasilar atelectasis. Remaining lungs clear. No pulmonary infiltrate or pneumothorax. Bones demineralized. IMPRESSION: Enlargement of cardiac silhouette post CABG and electric atrial appendage clipping. COPD with minimal bibasilar atelectasis and small LEFT pleural effusion. Aortic Atherosclerosis (ICD10-I70.0) and Emphysema (ICD10-J43.9). Electronically Signed   By: Ulyses Southward M.D.   On: 03/28/2021 19:21    Lab Data:  CBC: Recent Labs  Lab 03/29/21 0214  WBC 8.0  HGB 11.1*  HCT 33.0*  MCV 89.7  PLT  211   Basic Metabolic Panel: Recent Labs  Lab 03/29/21 0214  NA 130*  K 3.9  CL 101  CO2 24  GLUCOSE 81  BUN 14  CREATININE 0.73  CALCIUM 8.3*   GFR: CrCl cannot be calculated (Unknown ideal weight.). Liver Function Tests: No results for input(s): AST, ALT, ALKPHOS, BILITOT, PROT, ALBUMIN in the last 168 hours. No results for input(s): LIPASE, AMYLASE in the last 168 hours. No results for input(s): AMMONIA in the last 168 hours. Coagulation Profile: No results for input(s): INR, PROTIME in the last 168 hours. Cardiac Enzymes: No results for input(s): CKTOTAL, CKMB, CKMBINDEX, TROPONINI in the last 168 hours. BNP (last 3 results) No results for input(s): PROBNP in the last 8760 hours. HbA1C: No results for input(s): HGBA1C in the last 72 hours. CBG: No results for input(s): GLUCAP in the last 168 hours. Lipid Profile: No results for input(s): CHOL, HDL, LDLCALC, TRIG, CHOLHDL, LDLDIRECT in the last 72 hours. Thyroid Function Tests: No results for input(s): TSH, T4TOTAL, FREET4, T3FREE, THYROIDAB in the last 72 hours. Anemia Panel: No results for input(s): VITAMINB12, FOLATE, FERRITIN, TIBC, IRON, RETICCTPCT in the last 72 hours. Urine analysis:    Component Value Date/Time   COLORURINE YELLOW 11/26/2018 1634   APPEARANCEUR CLEAR 11/26/2018 1634   LABSPEC 1.005 11/26/2018 1634   PHURINE 7.0 11/26/2018 1634   GLUCOSEU NEGATIVE 11/26/2018 1634   HGBUR NEGATIVE 11/26/2018 1634   BILIRUBINUR NEGATIVE 11/26/2018 1634   KETONESUR NEGATIVE 11/26/2018 1634   PROTEINUR NEGATIVE 11/26/2018 1634   UROBILINOGEN 0.2 06/09/2015 2120   NITRITE NEGATIVE 11/26/2018 1634   LEUKOCYTESUR NEGATIVE 11/26/2018 1634     Glinda Natzke M.D. [facility] 03/29/2021, 10:06 AM  Available via Epic secure chat 7am-7pm After 7 pm, please refer to night coverage provider listed on amion.

## 2021-03-29 NOTE — TOC CAGE-AID Note (Signed)
Transition of Care [facility]) - CAGE-AID Screening   Patient Details  Name: Maria [name] MRN: [id number] Date of Birth: 1924-10-16     Elvina Sidle, RN  Trauma Response Nurse Phone Number:[phone] 03/29/2021, 5:25 PM   Clinical Narrative:    CAGE-AID Screening:    Have You Ever Felt You Ought to Cut Down on Your Drinking or Drug Use?: No Have People Annoyed You By Critizing Your Drinking Or Drug Use?: No Have You Felt Bad Or Guilty About Your Drinking Or Drug Use?: No Have You Ever Had a Drink or Used Drugs First Thing In The Morning to Steady Your Nerves or to Get Rid of a Hangover?: No CAGE-AID Score: 0  Substance Abuse Education Offered: No (denies drinking alcohol/or using drugs)

## 2021-03-29 NOTE — Plan of Care (Signed)
  Problem: Activity: Goal: Risk for activity intolerance will decrease Outcome: Progressing   Problem: Coping: Goal: Level of anxiety will decrease Outcome: Progressing   Problem: Pain Managment: Goal: General experience of comfort will improve Outcome: Progressing   Problem: Safety: Goal: Ability to remain free from injury will improve Outcome: Progressing   Problem: Skin Integrity: Goal: Risk for impaired skin integrity will decrease Outcome: Progressing   

## 2021-03-29 NOTE — Plan of Care (Signed)

## 2021-03-29 NOTE — Progress Notes (Signed)
[place] for heparin Indication: atrial fibrillation  Allergies  Allergen Reactions  . Fentanyl Nausea And Vomiting    Severe n/v  . Amoxicillin Nausea Only and Other (See Comments)    dizziness  . Atorvastatin Other (See Comments)    Unknown reaction - reported by [facility]  . Bupropion Other (See Comments)    nightmares  . Codeine Nausea And Vomiting  . Fosamax [Alendronate Sodium] Other (See Comments)    Jaw pain  . Gabapentin Other (See Comments)    dizziness  . Gluten Meal Other (See Comments)    Celiac Disease   . Hydrochlorothiazide Other (See Comments)    Hyponatremia, GI upset   . Tetanus Toxoid, Adsorbed Swelling    Severe swelling at site of injection (pt tolerates 1/2 injection then 1/2 week later)    Patient Measurements: Weight: 45.8 kg (101 lb) Heparin Dosing Weight: 45kg  Vital Signs: Temp: 98.2 F (36.8 C) (06/04 2035) Temp Source: Oral (06/04 2035) BP: 106/58 (06/04 2035) Pulse Rate: 68 (06/04 2035)  Labs: Recent Labs    03/29/21 0214  HGB 11.1*  HCT 33.0*  PLT 211  APTT 43*  HEPARINUNFRC 0.39  CREATININE 0.73    CrCl cannot be calculated (Unknown ideal weight.).   Medical History: Past Medical History:  Diagnosis Date  . Anemia   . Atonic bladder 12/11/2013  . Carotid artery occlusion    right carotid stenosis 40-60%, 4/08, neg for stenosis repeat study 11/11  . Celiac disease   . Chronic anticoagulation CHADS2VASC2 score 7 10/18/2014  . Chronic diastolic CHF (congestive heart failure) (HCC)    takes Lasix daily  . CKD (chronic kidney disease), stage III ([place])   . Coronary artery disease    a.  08/2013 (LIMA-LAD, rSVG to LCX and Lt atrial clip),  . Depression   . DNR (do not resuscitate) 03/28/2021  . DVT (deep venous thrombosis) ([place]) 09/2013  . Gastric ulcer    6-71month ago  . GERD (gastroesophageal reflux disease)    takes Omeprazole daily  . GI bleeding   . Hemorrhoids   . Hot  flashes    takes ZOloft 3 times a week  . Hypertension   . Hypothyroidism    takes Synthroid daily as result of AMiodarone  . Insomnia   . Macular degeneration   . Mild aortic insufficiency    a. Echo 10/2013: mild AI.  .Marland KitchenModerate mitral regurgitation by prior echocardiogram    a. Echo 10/2013.  . Moderate obstructive sleep apnea    uses CPAP;sleep study about 4-56monthago  . MVP (mitral valve prolapse)   . Osteoporosis   . PAF (paroxysmal atrial fibrillation) ([place]  . Peripheral edema    left  . Presbycusis   . Pulmonary hypertension ([place]  . RLS (restless legs syndrome)   . Thyroid nodule    62m66mno change 11/11  . Urethral stricture   . Urinary anastomotic stricture   . Urinary retention   . Vitamin D deficiency    Assessment: [age]F presenting from outside hospital with hip fx, hx afib on heparin gtt, PTA on Eliquis (held since 6/2).    6/5 am update:  APTT is low  Goal of Therapy:  Heparin level 0.3-0.7 units/ml aPTT 66-102 seconds Monitor platelets by anticoagulation protocol: Yes   Plan:  Inc heparin to 700 units/hr 1300 aPTT/heparin level  JamNarda BondsharmD, BCPMonroe Northarmacist Phone: [phone]

## 2021-03-29 NOTE — Progress Notes (Signed)
Spoke with patient, plan for repeat xrays of left hip to reeval fracture [place].    Plan for surgery tomorrow, likely pm.  Hemi vs. Pinning depending on repeat x-rays.  R/B/A discussed with the patient.    History of DVT, CABG, on eliquis, on afib, transition to heparin in preparation for surgery, will plan for TXA single dose around the time of surgery.    I have seen her as a patient in the office, although Dr. Mayer Camel actually did her surgery in 2015, and I have spoken with him and he would like me to offer her surgical care.    Full consult to follow.   Johnny Bridge, MD

## 2021-03-29 NOTE — Consult Note (Signed)
NAME: Maria [name] MRN:   [id number] DOB:   1924-10-04   CHIEF COMPLAINT:  Left hip pain  HISTORY:   Maria [name] a 87 y.o. female  with left  Hip Pain Patient complains of left hip pain. Onset of the symptoms was several days ago. Inciting event: fell while carrying shopping bags in the house. The patient reports the hip pain is worse with weight bearing. Associated symptoms: none. Aggravating symptoms include: any weight bearing.  PAST MEDICAL HISTORY:   Past Medical History:  Diagnosis Date  . Anemia   . Atonic bladder 12/11/2013  . Carotid artery occlusion    right carotid stenosis 40-60%, 4/08, neg for stenosis repeat study 11/11  . Celiac disease   . Chronic anticoagulation CHADS2VASC2 score 7 10/18/2014  . Chronic diastolic CHF (congestive heart failure) (HCC)    takes Lasix daily  . CKD (chronic kidney disease), stage III (HCC)   . Coronary artery disease    a.  08/2013 (LIMA-LAD, rSVG to LCX and Lt atrial clip),  . Depression   . DNR (do not resuscitate) 03/28/2021  . DVT (deep venous thrombosis) (HCC) 09/2013  . Gastric ulcer    6-33months ago  . GERD (gastroesophageal reflux disease)    takes Omeprazole daily  . GI bleeding   . Hemorrhoids   . Hot flashes    takes ZOloft 3 times a week  . Hypertension   . Hypothyroidism    takes Synthroid daily as result of AMiodarone  . Insomnia   . Macular degeneration   . Mild aortic insufficiency    a. Echo 10/2013: mild AI.  Marland Kitchen Moderate mitral regurgitation by prior echocardiogram    a. Echo 10/2013.  . Moderate obstructive sleep apnea    uses CPAP;sleep study about 4-29months ago  . MVP (mitral valve prolapse)   . Osteoporosis   . PAF (paroxysmal atrial fibrillation) (HCC)   . Peripheral edema    left  . Presbycusis   . Pulmonary hypertension (HCC)   . RLS (restless legs syndrome)   . Thyroid nodule    7mm, no change 11/11  . Urethral stricture   . Urinary anastomotic stricture   . Urinary retention   .  Vitamin D deficiency     PAST SURGICAL HISTORY:   Past Surgical History:  Procedure Laterality Date  . APPENDECTOMY    . bilateral cataract surgery    . CARDIAC CATHETERIZATION  08-06-13  . CORONARY ARTERY BYPASS GRAFT N/A 08/28/2013   Procedure: CORONARY ARTERY BYPASS GRAFTING (CABG) x2 using right greater saphenous vein and left internal mammary artery. ;  Surgeon: Delight Ovens, MD;  Location: [facility] OR;  Service: Open Heart Surgery;  Laterality: N/A;  . HIP ARTHROPLASTY Right 12/09/2013   Procedure: ARTHROPLASTY BIPOLAR HIP CEMENTED;  Surgeon: Nestor Lewandowsky, MD;  Location: [facility] OR;  Service: Orthopedics;  Laterality: Right;  . INTRAOPERATIVE TRANSESOPHAGEAL ECHOCARDIOGRAM N/A 08/28/2013   Procedure: INTRAOPERATIVE TRANSESOPHAGEAL ECHOCARDIOGRAM;  Surgeon: Delight Ovens, MD;  Location[facility];  Service: Open Heart Surgery;  Laterality: N/A;  . LEFT AND RIGHT HEART CATHETERIZATION WITH CORONARY ANGIOGRAM N/A 08/16/2013   Procedure: LEFT AND RIGHT HEART CATHETERIZATION WITH CORONARY ANGIOGRAM;  Surgeon: Lesleigh Noe, MD;  Location: [facility] CATH LAB;  Service: Cardiovascular;  Laterality: N/A;  . MANDIBLE SURGERY    . TCS    . TONSILLECTOMY    . trigger thumb    . TUBAL LIGATION      MEDICATIONS:  Medications Prior to Admission  Medication Sig Dispense Refill  . acetaminophen (TYLENOL) 500 MG tablet Take 1,000 mg by mouth every 6 (six) hours as needed (pain).    Marland Kitchen amiodarone (PACERONE) 200 MG tablet Take 0.5 tablets (100 mg total) by mouth daily. (Patient taking differently: Take 100 mg by mouth at bedtime.) 15 tablet 11  . amLODipine (NORVASC) 2.5 MG tablet Take 1 tablet (2.5 mg total) by mouth daily for 30 days. 30 tablet 0  . apixaban (ELIQUIS) 2.5 MG TABS tablet Take 1 tablet (2.5 mg total) by mouth 2 (two) times daily for 30 days. 60 tablet 0  . furosemide (LASIX) 20 MG tablet Take 1 tablet (20 mg total) by mouth daily as needed for fluid or edema. (Patient taking differently: Take  20 mg by mouth every morning.) 30 tablet   . levothyroxine (SYNTHROID) 25 MCG tablet Take 25 mcg by mouth daily before breakfast.    . magnesium hydroxide (MILK OF MAGNESIA) 400 MG/5ML suspension Take 15 mLs by mouth at bedtime as needed for mild constipation.    . melatonin 5 MG TABS Take 5 mg by mouth at bedtime.    . Menthol, Topical Analgesic, (ICY HOT EX) Apply 1 application topically daily as needed (pain).    Bertram Gala Glycol-Propyl Glycol (SYSTANE) 0.4-0.3 % SOLN Place 1 drop into both eyes at bedtime.    . sertraline (ZOLOFT) 50 MG tablet Take 50 mg by mouth every morning.    Marland Kitchen levothyroxine (SYNTHROID) 50 MCG tablet Take 25 mcg by mouth daily before breakfast. (Patient not taking: No sig reported)    . Melatonin CR 3 MG TBCR Take 3 mg by mouth at bedtime. (Patient not taking: No sig reported)      ALLERGIES:   Allergies  Allergen Reactions  . Fentanyl Nausea And Vomiting    Severe n/v  . Amoxicillin Nausea Only and Other (See Comments)    dizziness  . Atorvastatin Other (See Comments)    Unknown reaction - reported by [facility]  . Bupropion Other (See Comments)    nightmares  . Codeine Nausea And Vomiting  . Fosamax [Alendronate Sodium] Other (See Comments)    Jaw pain  . Gabapentin Other (See Comments)    dizziness  . Gluten Meal Other (See Comments)    Celiac Disease   . Hydrochlorothiazide Other (See Comments)    Hyponatremia, GI upset   . Tetanus Toxoid, Adsorbed Swelling    Severe swelling at site of injection (pt tolerates 1/2 injection then 1/2 week later)    REVIEW OF SYSTEMS:   Negative except MSK - left hip pain  FAMILY HISTORY:   Family History  Problem Relation Age of Onset  . Heart attack Mother   . CVA Mother   . CAD Father   . Colon cancer Father   . Heart attack Father   . Heart attack Brother   . Peripheral vascular disease Brother   . AAA (abdominal aortic aneurysm) Brother     SOCIAL HISTORY:   reports that she has never smoked. She has  never used smokeless tobacco. She reports that she does not drink alcohol and does not use drugs.  PHYSICAL EXAM:  General appearance: alert, cooperative and no distress Head: Normocephalic, without obvious abnormality, atraumatic Extremities: left hip pain, decreased internal and external ROM Pulses: 2+ and symmetric Skin: Skin color, texture, turgor normal. No rashes or lesions    LABORATORY STUDIES: Recent Labs    03/29/21 0214  WBC 8.0  HGB 11.1*  HCT 33.0*  PLT 211     Recent Labs    03/29/21 0214  NA 130*  K 3.9  CL 101  CO2 24  GLUCOSE 81  BUN 14  CREATININE 0.73  CALCIUM 8.3*    STUDIES/RESULTS:  DG CHEST PORT 1 VIEW  Result Date: 03/28/2021 CLINICAL DATA:  Preoperative evaluation LEFT hip fracture EXAM: PORTABLE CHEST 1 VIEW COMPARISON:  Portable exam at 1839 hours compared to 03/27/2020 FINDINGS: Enlargement of cardiac silhouette post CABG and LEFT atrial appendage clipping. Mediastinal contours and pulmonary vascularity normal. Atherosclerotic calcification aorta. Emphysematous and bronchitic changes consistent with COPD. Small LEFT pleural effusion and bibasilar atelectasis. Remaining lungs clear. No pulmonary infiltrate or pneumothorax. Bones demineralized. IMPRESSION: Enlargement of cardiac silhouette post CABG and electric atrial appendage clipping. COPD with minimal bibasilar atelectasis and small LEFT pleural effusion. Aortic Atherosclerosis (ICD10-I70.0) and Emphysema (ICD10-J43.9). Electronically Signed   By: Ulyses Southward M.D.   On: 03/28/2021 19:21   DG HIP UNILAT WITH PELVIS 2-3 VIEWS LEFT  Result Date: 03/29/2021 CLINICAL DATA:  Close left hip fracture EXAM: DG HIP (WITH OR WITHOUT PELVIS) 2-3V LEFT COMPARISON:  CT September 03, 2020. FINDINGS: Essentially nondisplaced basicervical fracture of the left femur. Prior right total hip arthroplasty. IMPRESSION: Essentially nondisplaced basicervical fracture of the left femur. Electronically Signed   By: Maudry Mayhew MD   On: 03/29/2021 12:26    ASSESSMENT: Nondisplaced left femoral neck fracture  PLAN:   Pain well controlled   NPO after midnight Hemiarthroplasty with Dr Dion Saucier tomorrow   Nesanel Aguila,STEPHEN D 03/29/2021. 1:06 PM

## 2021-03-29 NOTE — Progress Notes (Signed)
[place] for heparin Indication: atrial fibrillation  Allergies  Allergen Reactions  . Fentanyl Nausea And Vomiting    Severe n/v  . Amoxicillin Nausea Only and Other (See Comments)    dizziness  . Atorvastatin Other (See Comments)    Unknown reaction - reported by [facility]  . Bupropion Other (See Comments)    nightmares  . Codeine Nausea And Vomiting  . Fosamax [Alendronate Sodium] Other (See Comments)    Jaw pain  . Gabapentin Other (See Comments)    dizziness  . Gluten Meal Other (See Comments)    Celiac Disease   . Hydrochlorothiazide Other (See Comments)    Hyponatremia, GI upset   . Tetanus Toxoid, Adsorbed Swelling    Severe swelling at site of injection (pt tolerates 1/2 injection then 1/2 week later)    Patient Measurements: Weight: 45.8 kg (101 lb) Heparin Dosing Weight: 45kg  Vital Signs: Temp: 98.5 F (36.9 C) (06/05 1631) Temp Source: Oral (06/05 1631) BP: 126/70 (06/05 1631) Pulse Rate: 66 (06/05 1631)  Labs: Recent Labs    03/29/21 0214 03/29/21 1317  HGB 11.1*  --   HCT 33.0*  --   PLT 211  --   APTT 43* 42*  HEPARINUNFRC 0.39 0.32  CREATININE 0.73  --     CrCl cannot be calculated (Unknown ideal weight.).   Medical History: Past Medical History:  Diagnosis Date  . Anemia   . Atonic bladder 12/11/2013  . Carotid artery occlusion    right carotid stenosis 40-60%, 4/08, neg for stenosis repeat study 11/11  . Celiac disease   . Chronic anticoagulation CHADS2VASC2 score 7 10/18/2014  . Chronic diastolic CHF (congestive heart failure) (HCC)    takes Lasix daily  . CKD (chronic kidney disease), stage III ([place])   . Coronary artery disease    a.  08/2013 (LIMA-LAD, rSVG to LCX and Lt atrial clip),  . Depression   . DNR (do not resuscitate) 03/28/2021  . DVT (deep venous thrombosis) ([place]) 09/2013  . Gastric ulcer    6-103month ago  . GERD (gastroesophageal reflux disease)    takes Omeprazole daily  .  GI bleeding   . Hemorrhoids   . Hot flashes    takes ZOloft 3 times a week  . Hypertension   . Hypothyroidism    takes Synthroid daily as result of AMiodarone  . Insomnia   . Macular degeneration   . Mild aortic insufficiency    a. Echo 10/2013: mild AI.  .Marland KitchenModerate mitral regurgitation by prior echocardiogram    a. Echo 10/2013.  . Moderate obstructive sleep apnea    uses CPAP;sleep study about 4-544monthago  . MVP (mitral valve prolapse)   . Osteoporosis   . PAF (paroxysmal atrial fibrillation) (H[facility]usan Moore  . Peripheral edema    left  . Presbycusis   . Pulmonary hypertension ([place] [place]  . RLS (restless legs syndrome)   . Thyroid nodule    50m38mno change 11/11  . Urethral stricture   . Urinary anastomotic stricture   . Urinary retention   . Vitamin D deficiency    Assessment: [age]F presenting from outside hospital with hip fx, hx afib on heparin gtt, PTA on Eliquis (held since 6/2). Plans noted for procedure on 6/6 -aPTT= 42 on 700 units/hr   Goal of Therapy:  Heparin level 0.3-0.7 units/ml aPTT 66-102 seconds Monitor platelets by anticoagulation protocol: Yes   Plan:  -Increase heparin to 850 units/hr -  Heparin level and CBC in am -Will follow plans post procedure   Hildred Laser, PharmD Clinical Pharmacist **Pharmacist phone directory can now be found on [email or web address] ([email or web address]).  Listed under [place].   e

## 2021-03-30 ENCOUNTER — Inpatient Hospital Stay (HOSPITAL_COMMUNITY): Payer: Medicare Other

## 2021-03-30 ENCOUNTER — Inpatient Hospital Stay (HOSPITAL_COMMUNITY): Admission: RE | Admit: 2021-03-30 | Payer: Medicare Other | Source: Ambulatory Visit | Admitting: Orthopedic Surgery

## 2021-03-30 ENCOUNTER — Inpatient Hospital Stay (HOSPITAL_COMMUNITY): Payer: Medicare Other | Admitting: Certified Registered Nurse Anesthetist

## 2021-03-30 ENCOUNTER — Encounter (HOSPITAL_COMMUNITY)
Admission: AD | Disposition: A | Discharge status: Rehabilitation Facility | Payer: Self-pay | Source: Other Acute Inpatient Hospital | Attending: Internal Medicine

## 2021-03-30 ENCOUNTER — Encounter (HOSPITAL_COMMUNITY): Payer: Self-pay | Admitting: Internal Medicine

## 2021-03-30 HISTORY — PX: HIP PINNING,CANNULATED: SHX1758

## 2021-03-30 LAB — CBC
HCT: 35.7 % — ABNORMAL LOW (ref 36.0–46.0)
Hemoglobin: 11.7 g/dL — ABNORMAL LOW (ref 12.0–15.0)
MCH: 29.4 pg (ref 26.0–34.0)
MCHC: 32.8 g/dL (ref 30.0–36.0)
MCV: 89.7 fL (ref 80.0–100.0)
Platelets: 252 10*3/uL (ref 150–400)
RBC: 3.98 MIL/uL (ref 3.87–5.11)
RDW: 14.8 % (ref 11.5–15.5)
WBC: 6.1 10*3/uL (ref 4.0–10.5)
nRBC: 0 % (ref 0.0–0.2)

## 2021-03-30 LAB — BASIC METABOLIC PANEL
Anion gap: 6 (ref 5–15)
BUN: 10 mg/dL (ref 8–23)
CO2: 25 mmol/L (ref 22–32)
Calcium: 8.4 mg/dL — ABNORMAL LOW (ref 8.9–10.3)
Chloride: 101 mmol/L (ref 98–111)
Creatinine, Ser: 0.59 mg/dL (ref 0.44–1.00)
GFR, Estimated: >60 mL/min (ref >60)
Glucose, Bld: 89 mg/dL (ref 70–99)
Potassium: 3.8 mmol/L (ref 3.5–5.1)
Sodium: 132 mmol/L — ABNORMAL LOW (ref 135–145)

## 2021-03-30 LAB — HEPARIN LEVEL (UNFRACTIONATED): Heparin Unfractionated: 0.33 IU/mL (ref 0.30–0.70)

## 2021-03-30 LAB — APTT: aPTT: 77 seconds — ABNORMAL HIGH (ref 24–36)

## 2021-03-30 SURGERY — FIXATION, FEMUR, NECK, PERCUTANEOUS, USING SCREW
Anesthesia: General | Site: Hip | Laterality: Left

## 2021-03-30 MED ORDER — PHENYLEPHRINE HCL-NACL 10-0.9 MG/250ML-% IV SOLN
INTRAVENOUS | Status: DC | PRN
  Administered 2021-03-30: 30 ug/min via INTRAVENOUS

## 2021-03-30 MED ORDER — SENNA 8.6 MG PO TABS
1.0000 | ORAL_TABLET | Freq: Two times a day (BID) | ORAL | Status: DC
  Administered 2021-03-30 – 2021-04-02 (×5): 8.6 mg via ORAL
  Filled 2021-03-30 (×5): qty 1

## 2021-03-30 MED ORDER — CHLORHEXIDINE GLUCONATE 0.12 % MT SOLN
15.0000 mL | Freq: Once | OROMUCOSAL | Status: AC
  Administered 2021-03-30: 15 mL via OROMUCOSAL

## 2021-03-30 MED ORDER — TRANEXAMIC ACID-NACL 1000-0.7 MG/100ML-% IV SOLN
1000.0000 mg | INTRAVENOUS | Status: AC
  Administered 2021-03-30: 1000 mg via INTRAVENOUS

## 2021-03-30 MED ORDER — HYDROCODONE-ACETAMINOPHEN 5-325 MG PO TABS
1.0000 | ORAL_TABLET | ORAL | Status: DC | PRN
  Administered 2021-03-31 – 2021-04-01 (×4): 1 via ORAL
  Filled 2021-03-30 (×5): qty 1

## 2021-03-30 MED ORDER — PROPOFOL 10 MG/ML IV BOLUS
INTRAVENOUS | Status: DC | PRN
  Administered 2021-03-30: 60 mg via INTRAVENOUS
  Administered 2021-03-30 (×3): 20 mg via INTRAVENOUS

## 2021-03-30 MED ORDER — MENTHOL 3 MG MT LOZG: 1.0000 | LOZENGE | OROMUCOSAL | Status: DC | PRN

## 2021-03-30 MED ORDER — LACTATED RINGERS IV SOLN: INTRAVENOUS | Status: DC

## 2021-03-30 MED ORDER — ACETAMINOPHEN 325 MG PO TABS
325.0000 mg | ORAL_TABLET | Freq: Four times a day (QID) | ORAL | Status: DC | PRN
  Administered 2021-03-31: 650 mg via ORAL
  Filled 2021-03-30 (×2): qty 2

## 2021-03-30 MED ORDER — BISACODYL 10 MG RE SUPP: 10.0000 mg | Freq: Every day | RECTAL | Status: DC | PRN

## 2021-03-30 MED ORDER — PHENOL 1.4 % MT LIQD: 1.0000 | OROMUCOSAL | Status: DC | PRN

## 2021-03-30 MED ORDER — APIXABAN 2.5 MG PO TABS
2.5000 mg | ORAL_TABLET | Freq: Two times a day (BID) | ORAL | Status: DC
  Administered 2021-03-31 – 2021-04-02 (×5): 2.5 mg via ORAL
  Filled 2021-03-30 (×5): qty 1

## 2021-03-30 MED ORDER — ORAL CARE MOUTH RINSE: 15.0000 mL | Freq: Once | OROMUCOSAL | Status: AC

## 2021-03-30 MED ORDER — POTASSIUM CHLORIDE IN NACL 20-0.45 MEQ/L-% IV SOLN
INTRAVENOUS | Status: DC
  Filled 2021-03-30: qty 1000

## 2021-03-30 MED ORDER — 0.9 % SODIUM CHLORIDE (POUR BTL) OPTIME
TOPICAL | Status: DC | PRN
  Administered 2021-03-30: 1000 mL

## 2021-03-30 MED ORDER — MAGNESIUM CITRATE PO SOLN: 1.0000 | Freq: Once | ORAL | Status: DC | PRN

## 2021-03-30 MED ORDER — HYDROMORPHONE HCL 1 MG/ML IJ SOLN
INTRAMUSCULAR | Status: AC
  Filled 2021-03-30: qty 1

## 2021-03-30 MED ORDER — DOCUSATE SODIUM 100 MG PO CAPS
100.0000 mg | ORAL_CAPSULE | Freq: Two times a day (BID) | ORAL | Status: DC
  Administered 2021-03-30 – 2021-04-02 (×5): 100 mg via ORAL
  Filled 2021-03-30 (×5): qty 1

## 2021-03-30 MED ORDER — FENTANYL CITRATE (PF) 100 MCG/2ML IJ SOLN
INTRAMUSCULAR | Status: DC | PRN
  Administered 2021-03-30 (×3): 25 ug via INTRAVENOUS

## 2021-03-30 MED ORDER — ENSURE PRE-SURGERY PO LIQD
296.0000 mL | Freq: Once | ORAL | Status: AC
  Administered 2021-03-30: 296 mL via ORAL
  Filled 2021-03-30: qty 296

## 2021-03-30 MED ORDER — ACETAMINOPHEN 500 MG PO TABS
500.0000 mg | ORAL_TABLET | Freq: Four times a day (QID) | ORAL | Status: AC
  Administered 2021-03-31: 500 mg via ORAL
  Filled 2021-03-30 (×2): qty 1

## 2021-03-30 MED ORDER — ALUM & MAG HYDROXIDE-SIMETH 200-200-20 MG/5ML PO SUSP: 30.0000 mL | ORAL | Status: DC | PRN

## 2021-03-30 MED ORDER — MAGNESIUM HYDROXIDE 400 MG/5ML PO SUSP
15.0000 mL | Freq: Every evening | ORAL | Status: DC | PRN
  Filled 2021-03-30: qty 30

## 2021-03-30 MED ORDER — AMISULPRIDE (ANTIEMETIC) 5 MG/2ML IV SOLN
10.0000 mg | Freq: Once | INTRAVENOUS | Status: AC | PRN
  Administered 2021-03-30: 10 mg via INTRAVENOUS

## 2021-03-30 MED ORDER — ONDANSETRON HCL 4 MG/2ML IJ SOLN
INTRAMUSCULAR | Status: DC | PRN
  Administered 2021-03-30: 4 mg via INTRAVENOUS

## 2021-03-30 MED ORDER — ROCURONIUM BROMIDE 10 MG/ML (PF) SYRINGE
PREFILLED_SYRINGE | INTRAVENOUS | Status: DC | PRN
  Administered 2021-03-30: 40 mg via INTRAVENOUS

## 2021-03-30 MED ORDER — HYDROMORPHONE HCL 1 MG/ML IJ SOLN
0.2500 mg | INTRAMUSCULAR | Status: DC | PRN
  Administered 2021-03-30: 0.5 mg via INTRAVENOUS
  Administered 2021-03-30 (×2): 0.25 mg via INTRAVENOUS

## 2021-03-30 MED ORDER — APIXABAN 2.5 MG PO TABS
2.5000 mg | ORAL_TABLET | Freq: Two times a day (BID) | ORAL | Status: DC
  Filled 2021-03-30: qty 1

## 2021-03-30 MED ORDER — LIDOCAINE 2% (20 MG/ML) 5 ML SYRINGE
INTRAMUSCULAR | Status: DC | PRN
  Administered 2021-03-30: 50 mg via INTRAVENOUS

## 2021-03-30 MED ORDER — DEXAMETHASONE SODIUM PHOSPHATE 10 MG/ML IJ SOLN
INTRAMUSCULAR | Status: DC | PRN
  Administered 2021-03-30: 4 mg via INTRAVENOUS

## 2021-03-30 MED ORDER — CHLORHEXIDINE GLUCONATE 4 % EX LIQD: 60.0000 mL | Freq: Once | CUTANEOUS | Status: DC

## 2021-03-30 MED ORDER — ADULT MULTIVITAMIN W/MINERALS CH
1.0000 | ORAL_TABLET | Freq: Every day | ORAL | Status: DC
  Administered 2021-03-31 – 2021-04-02 (×3): 1 via ORAL
  Filled 2021-03-30 (×3): qty 1

## 2021-03-30 MED ORDER — POVIDONE-IODINE 10 % EX SWAB
2.0000 "application " | Freq: Once | CUTANEOUS | Status: AC
  Administered 2021-03-30: 2 via TOPICAL

## 2021-03-30 MED ORDER — FERROUS SULFATE 325 (65 FE) MG PO TABS
325.0000 mg | ORAL_TABLET | Freq: Three times a day (TID) | ORAL | Status: DC
  Filled 2021-03-30: qty 1

## 2021-03-30 MED ORDER — BUPIVACAINE HCL 0.25 % IJ SOLN
INTRAMUSCULAR | Status: DC | PRN
  Administered 2021-03-30: 30 mL

## 2021-03-30 MED ORDER — ONDANSETRON HCL 4 MG PO TABS: 4.0000 mg | ORAL_TABLET | Freq: Four times a day (QID) | ORAL | Status: DC | PRN

## 2021-03-30 MED ORDER — EPHEDRINE SULFATE-NACL 50-0.9 MG/10ML-% IV SOSY
PREFILLED_SYRINGE | INTRAVENOUS | Status: DC | PRN
  Administered 2021-03-30: 10 mg via INTRAVENOUS

## 2021-03-30 MED ORDER — ENSURE ENLIVE PO LIQD
237.0000 mL | Freq: Two times a day (BID) | ORAL | Status: DC
  Administered 2021-03-31 – 2021-04-02 (×4): 237 mL via ORAL

## 2021-03-30 MED ORDER — CEFAZOLIN SODIUM-DEXTROSE 2-4 GM/100ML-% IV SOLN
2.0000 g | Freq: Three times a day (TID) | INTRAVENOUS | Status: AC
  Administered 2021-03-30 – 2021-03-31 (×2): 2 g via INTRAVENOUS
  Filled 2021-03-30 (×2): qty 100

## 2021-03-30 MED ORDER — CEFAZOLIN SODIUM-DEXTROSE 2-4 GM/100ML-% IV SOLN
2.0000 g | INTRAVENOUS | Status: AC
  Administered 2021-03-30: 2 g via INTRAVENOUS

## 2021-03-30 MED ORDER — ONDANSETRON HCL 4 MG/2ML IJ SOLN: 4.0000 mg | Freq: Four times a day (QID) | INTRAMUSCULAR | Status: DC | PRN

## 2021-03-30 MED ORDER — SUGAMMADEX SODIUM 200 MG/2ML IV SOLN
INTRAVENOUS | Status: DC | PRN
  Administered 2021-03-30: 100 mg via INTRAVENOUS

## 2021-03-30 SURGICAL SUPPLY — 49 items
APL SKNCLS STERI-STRIP NONHPOA (GAUZE/BANDAGES/DRESSINGS)
BENZOIN TINCTURE PRP APPL 2/3 (GAUZE/BANDAGES/DRESSINGS) IMPLANT
BIT DRILL 4.8X300 (BIT) ×1 IMPLANT
BIT DRILL 4.8X300MM (BIT) ×1
BNDG COHESIVE 6X5 TAN STRL LF (GAUZE/BANDAGES/DRESSINGS) IMPLANT
BOOTCOVER CLEANROOM LRG (PROTECTIVE WEAR) ×6 IMPLANT
CLOSURE STERI-STRIP 1/2X4 (GAUZE/BANDAGES/DRESSINGS) ×1
CLSR STERI-STRIP ANTIMIC 1/2X4 (GAUZE/BANDAGES/DRESSINGS) ×1 IMPLANT
COVER PERINEAL POST (MISCELLANEOUS) ×3 IMPLANT
COVER SURGICAL LIGHT HANDLE (MISCELLANEOUS) ×3 IMPLANT
COVER WAND RF STERILE (DRAPES) ×1 IMPLANT
DRAPE STERI IOBAN 125X83 (DRAPES) ×3 IMPLANT
DRESSING MEPILEX FLEX 4X4 (GAUZE/BANDAGES/DRESSINGS) IMPLANT
DRSG MEPILEX BORDER 4X4 (GAUZE/BANDAGES/DRESSINGS) ×3 IMPLANT
DRSG MEPILEX FLEX 4X4 (GAUZE/BANDAGES/DRESSINGS) ×3
DRSG PAD ABDOMINAL 8X10 ST (GAUZE/BANDAGES/DRESSINGS) IMPLANT
DURAPREP 26ML APPLICATOR (WOUND CARE) ×3 IMPLANT
ELECT REM PT RETURN 9FT ADLT (ELECTROSURGICAL) ×3
ELECTRODE REM PT RTRN 9FT ADLT (ELECTROSURGICAL) ×1 IMPLANT
GLOVE BIO SURGEON STRL SZ7 (GLOVE) ×6 IMPLANT
GLOVE ORTHO TXT STRL SZ7.5 (GLOVE) ×3 IMPLANT
GLOVE SURG UNDER POLY LF SZ7 (GLOVE) ×3 IMPLANT
GOWN STRL REUS W/ TWL LRG LVL3 (GOWN DISPOSABLE) IMPLANT
GOWN STRL REUS W/ TWL XL LVL3 (GOWN DISPOSABLE) ×1 IMPLANT
GOWN STRL REUS W/TWL LRG LVL3 (GOWN DISPOSABLE)
GOWN STRL REUS W/TWL XL LVL3 (GOWN DISPOSABLE) ×3
KIT BASIN OR (CUSTOM PROCEDURE TRAY) ×3 IMPLANT
KIT TURNOVER KIT B (KITS) ×3 IMPLANT
MANIFOLD NEPTUNE II (INSTRUMENTS) ×3 IMPLANT
NEEDLE 22X1 1/2 (OR ONLY) (NEEDLE) ×1 IMPLANT
NS IRRIG 1000ML POUR BTL (IV SOLUTION) ×3 IMPLANT
PACK GENERAL/GYN (CUSTOM PROCEDURE TRAY) ×3 IMPLANT
PAD ARMBOARD 7.5X6 YLW CONV (MISCELLANEOUS) ×6 IMPLANT
PIN GUIDE DRILL TIP 2.8X300 (DRILL) ×6 IMPLANT
SCREW 16MM THREAD 6.5X75MM (Screw) ×4 IMPLANT
SCREW CANN THRD 6.5X80 (Screw) ×2 IMPLANT
SUT VIC AB 0 CT1 27 (SUTURE) ×3
SUT VIC AB 0 CT1 27XBRD ANBCTR (SUTURE) IMPLANT
SUT VIC AB 2-0 FS1 27 (SUTURE) ×1 IMPLANT
SUT VIC AB 2-0 SH 27 (SUTURE)
SUT VIC AB 2-0 SH 27XBRD (SUTURE) IMPLANT
SUT VIC AB 3-0 CT1 27 (SUTURE) ×3
SUT VIC AB 3-0 CT1 TAPERPNT 27 (SUTURE) IMPLANT
SUT VIC AB 3-0 SH 27 (SUTURE)
SUT VIC AB 3-0 SH 27X BRD (SUTURE) IMPLANT
SYR CONTROL 10ML LL (SYRINGE) ×3 IMPLANT
TOWEL GREEN STERILE (TOWEL DISPOSABLE) ×3 IMPLANT
TOWEL GREEN STERILE FF (TOWEL DISPOSABLE) ×3 IMPLANT
WATER STERILE IRR 1000ML POUR (IV SOLUTION) ×3 IMPLANT

## 2021-03-30 NOTE — Progress Notes (Signed)
Initial Nutrition Assessment  DOCUMENTATION CODES:   Not applicable  INTERVENTION:   -Once diet is advanced, add:   -Ensure Enlive po BID, each supplement provides 350 kcal and 20 grams of protein -MVI with minerals daily  NUTRITION DIAGNOSIS:   Increased nutrient needs related to post-op healing as evidenced by estimated needs.  GOAL:   Patient will meet greater than or equal to 90% of their needs  MONITOR:   PO intake,Supplement acceptance,Diet advancement,Labs,Weight trends,Skin,I & O's  REASON FOR ASSESSMENT:   Consult Assessment of nutrition requirement/status,Hip fracture protocol  ASSESSMENT:   Maria [name] is a 87 y.o. female with medical history significant of HTN; afib on Eliquis; CAD; OSA on CPAP; hypothyroidism; CAD s/p CABG; celiac disease; chronic diastolic CHF; and stage 3a CKD presenting with L hip fracture.  She was at [facility].  They are renov[place] their beach house and went shopping for things to decorate.  She was carrying things into the house and stepped into the house and stepped onto a rug and it slipped out from under her; they had bought it and it did not have a backing on it.  She landed on her L hip.  She previously had a R hip repair in about 2014.  She is "settling down" now, the ambulance ride was not very comfortable.  Pt admitted with closed lt hip fracture s/p fall.   Reviewed I/O's: +912 ml x 24 hours  Pt unavailable at time of visit. She is currently in OR. RD unable to obtain further nutrition-related history or complete nutrition-focused physical exam at this time.   Per orthopedics notes, plan for surgery today. Pt currently NPO for procedure.   Pt previously on a gluten free diet with variable intake. Noted meal completion 30-90%.   Reviewed wt hx; pt has experienced a 7% wt loss over the past year. While this is not significant for time frame, it is concerning given advanced age.   Pt with increased nutritional  needs for post-operative healing and would benefit from addition of oral nutrition supplements.   Medications reviewed and include colace and melatonin.  Labs reviewed.   Diet Order:   Diet Order            Diet NPO time specified Except for: Sips with Meds  Diet effective midnight                 EDUCATION NEEDS:   No education needs have been identified at this time  Skin:  Skin Assessment: Reviewed RN Assessment  Last BM:  03/30/21  Height:   Ht Readings from Last 1 Encounters:  03/30/21 5' 1.5" (1.562 m)    Weight:   Wt Readings from Last 1 Encounters:  03/30/21 46.3 kg    Ideal Body Weight:  47.7 kg  BMI:  Body mass index is 18.96 kg/m.  Estimated Nutritional Needs:   Kcal:  1450-1650  Protein:  70-85 grams  Fluid:  > 1.4 L    Loistine Chance, RD, LDN, [place] Registered Dietitian II Certified Diabetes Care and Education Specialist Please refer to [facility] for RD and/or RD on-call/weekend/after hours pager

## 2021-03-30 NOTE — Anesthesia Procedure Notes (Signed)
Procedure Name: Intubation Date/Time: 03/30/2021 1:54 PM Performed by: Genelle Bal, CRNA Pre-anesthesia Checklist: Patient identified, Emergency Drugs available, Suction available and Patient being monitored Patient Re-evaluated:Patient Re-evaluated prior to induction Oxygen Delivery Method: Circle system utilized Preoxygenation: Pre-oxygenation with 100% oxygen Induction Type: IV induction Ventilation: Mask ventilation without difficulty Laryngoscope Size: Glidescope and 3 Grade View: Grade I Tube type: Oral Tube size: 7.0 mm Number of attempts: 1 Airway Equipment and Method: Stylet,  Oral airway and Video-laryngoscopy Placement Confirmation: ETT inserted through vocal cords under direct vision,  positive ETCO2 and breath sounds checked- equal and bilateral Secured at: 20 cm Tube secured with: Tape Dental Injury: Teeth and Oropharynx as per pre-operative assessment  Comments: DLx1 Mil 2 by CRNA, grade III view, anterior larynx noted. DLx1 Mil2 by MDA, no change in view. Glidescope #3 x1, grade I view, ATOI. +BBS/etCO2 confirm.

## 2021-03-30 NOTE — Progress Notes (Signed)
[facility]                                                                              Patient Demographics  Maria [name], is a 87 y.o. female, DOB - [birth year], BJY:[id number]  Admit date - 03/28/2021   Admitting Physician Jonah Blue, MD  Outpatient Primary MD for the patient is Merlene Laughter, MD  Outpatient specialists:   LOS - 2  days   Medical records reviewed and are as summarized below:    chief complaint : Fall with hip fracture       Brief summary   Patient is a 87 year old female with history of hypertension, A. fib on Eliquis, CAD, OSA on CPAP, hypothyroidism, CAD status post CABG, celiac disease, chronic dCHF, stage IIIa CKD presented with left hip fracture.  Patient was at [facility]).  They are renovating their beach house and when shopping for decoration.  She was getting things into the house and stepped onto a rug and it slipped out from under her.  Patient landed on her left hip.  She had a previous right hip repair in 2014.  Patient went to [facility], imaging showed left femur fracture.  X-rays of the left elbow did not show any fracture or dislocation.  CT C-spine did not show any fracture or dislocation. MD requested transfer to [facility].  Orthopedics consulted, eliquis has been placed on hold, pending surgery possibly tomorrow.  Assessment & Plan    Principal Problem: Mechanical fall with closed left hip fracture, initial encounter [facility]) -Orthopedics consulted, plan for operative repair today.  N.p.o.  -Eliquis has been held, on heparin drip - will follow orthopedics recommendations regarding when to resume heparin drip or eliquis -Continue pain control, bowel regimen  Active Problems: Paroxysmal atrial fibrillation with secondary thrombophilia -HR stable, continue amiodarone -Eliquis has been held for 3 days, on heparin drip -Follow orthopedics medications    Essential hypertension, benign -Continue Norvasc.  BP  stable  OSA -Continue CPAP    Hypothyroidism -Continue Synthroid    CKD (chronic kidney disease), stage IIIa (HCC) -Creatinine stable, 0.5  Chronic diastolic CHF -Currently compensated, takes Lasix 20 mg daily -Echo in 11/2018 had shown EF of 60 to 65%  Code Status: DNR DVT Prophylaxis:  Heparin drip   Level of Care: Level of care: Med-Surg Family Communication: Discussed all imaging results, lab results, explained to the patient's daughter, Darrell Jewel on phone # [phone] on 6/5   Disposition Plan:     Status is: Inpatient  Remains inpatient appropriate because:Inpatient level of care appropriate due to severity of illness   Dispo: The patient is from: Home              Anticipated d/c is to: TBD              Patient currently is not medically stable to d/c.  Operative repair today   Difficult to place patient No    Time Spent in minutes 25 minutes  Procedures:  None  Consultants:   Orthopedics  Antimicrobials:   Anti-infectives (From admission, onward)   Start     Dose/Rate Route  Frequency Ordered Stop   03/31/21 0600  ceFAZolin (ANCEF) IVPB 2g/100 mL premix        2 g 200 mL/hr over 30 Minutes Intravenous On call to O.R. 03/30/21 1238 04/01/21 0559         Medications  Scheduled Meds: . [MAR Hold] amiodarone  100 mg Oral QHS  . [MAR Hold] amLODipine  2.5 mg Oral Daily  . chlorhexidine  60 mL Topical Once  . [MAR Hold] docusate sodium  100 mg Oral BID  . [MAR Hold] levothyroxine  25 mcg Oral QAC breakfast  . [MAR Hold] melatonin  3 mg Oral QHS  . povidone-iodine  2 application Topical Once  . [MAR Hold] sertraline  50 mg Oral Daily   Continuous Infusions: . [START ON 03/31/2021]  ceFAZolin (ANCEF) IV    . heparin Stopped (03/30/21 1227)  . lactated ringers 10 mL/hr at 03/30/21 1253  . [MAR Hold] methocarbamol (ROBAXIN) IV    . tranexamic acid     PRN Meds:.[MAR Hold] acetaminophen, [MAR Hold] bisacodyl, [MAR Hold]  HYDROcodone-acetaminophen, [MAR Hold] methocarbamol **OR** [MAR Hold] methocarbamol (ROBAXIN) IV, [MAR Hold]  morphine injection, [MAR Hold] polyethylene glycol      Subjective:   Maria [name] was seen and examined today.  Seen earlier in a.m., awaiting surgery.  No acute complaints except constipation.  No acute events overnight.  Patient denies dizziness, chest pain, shortness of breath, abdominal pain, nausea vomiting   Objective:   Vitals:   03/29/21 1631 03/29/21 2037 03/30/21 0650 03/30/21 1244  BP: 126/70 138/66 132/66 (!) 155/54  Pulse: 66 65 67 62  Resp: 16 16 16 17   Temp: 98.5 F (36.9 C) 98.5 F (36.9 C) 97.6 F (36.4 C) 98.2 F (36.8 C)  TempSrc: Oral Oral Oral Oral  SpO2: 99% 99% 99% 95%  Weight:    46.3 kg  Height:    5' 1.5" (1.562 m)    Intake/Output Summary (Last 24 hours) at 03/30/2021 1253 Last data filed at 03/30/2021 0300 Gross per 24 hour  Intake 353.17 ml  Output --  Net 353.17 ml     Wt Readings from Last 3 Encounters:  03/30/21 46.3 kg  03/27/20 49 kg  03/25/20 49.3 kg   Physical Exam  General: Alert and oriented x 3, NAD  Cardiovascular: S1 S2 clear, RRR. No pedal edema b/l  Respiratory: CTAB, no wheezing, rales or rhonchi  Gastrointestinal: Soft, nontender, nondistended, NBS  Ext: b/l heel protectors  Neuro: no new deficits  Psych: Normal affect and demeanor, alert and oriented x3     Data Reviewed:  I have personally reviewed following labs and imaging studies  Micro Results Recent Results (from the past 240 hour(s))  SARS CORONAVIRUS 2 (TAT 6-24 HRS) Nasopharyngeal Nasopharyngeal Swab     Status: None   Collection Time: 03/28/21  5:37 PM   Specimen: Nasopharyngeal Swab  Result Value Ref Range Status   SARS Coronavirus 2 NEGATIVE NEGATIVE Final    Comment: (NOTE) SARS-CoV-2 target nucleic acids are NOT DETECTED.  The SARS-CoV-2 RNA is generally detectable in upper and lower respiratory specimens during the acute phase  of infection. Negative results do not preclude SARS-CoV-2 infection, do not rule out co-infections with other pathogens, and should not be used as the sole basis for treatment or other patient management decisions. Negative results must be combined with clinical observations, patient history, and epidemiological information. The expected result is Negative.  Fact Sheet for Patients: HairSlick.no  Fact Sheet for Healthcare Providers:  quierodirigir.com  This test is not yet approved or cleared by the [place] and  has been authorized for detection and/or diagnosis of SARS-CoV-2 by FDA under an Emergency Use Authorization (EUA). This EUA will remain  in effect (meaning this test can be used) for the duration of the COVID-19 declaration under Se ction 564(b)(1) of the Act, 21 U.S.C. section 360bbb-3(b)(1), unless the authorization is terminated or revoked sooner.  Performed at [facility], [address], [place], [place] [address]   Surgical pcr screen     Status: None   Collection Time: 03/28/21  5:38 PM   Specimen: Nasal Mucosa; Nasal Swab  Result Value Ref Range Status   MRSA, PCR NEGATIVE NEGATIVE Final   Staphylococcus aureus NEGATIVE NEGATIVE Final    Comment: (NOTE) The Xpert SA Assay (FDA approved for NASAL specimens in patients 34 years of age and older), is one component of a comprehensive surveillance program. It is not intended to diagnose infection nor to guide or monitor treatment. Performed at [facility], [address] [place], [place] [address]     Radiology Reports DG CHEST PORT 1 VIEW  Result Date: 03/28/2021 CLINICAL DATA:  Preoperative evaluation LEFT hip fracture EXAM: PORTABLE CHEST 1 VIEW COMPARISON:  Portable exam at 1839 hours compared to 03/27/2020 FINDINGS: Enlargement of cardiac silhouette post CABG and LEFT atrial appendage clipping. Mediastinal contours and pulmonary  vascularity normal. Atherosclerotic calcification aorta. Emphysematous and bronchitic changes consistent with COPD. Small LEFT pleural effusion and bibasilar atelectasis. Remaining lungs clear. No pulmonary infiltrate or pneumothorax. Bones demineralized. IMPRESSION: Enlargement of cardiac silhouette post CABG and electric atrial appendage clipping. COPD with minimal bibasilar atelectasis and small LEFT pleural effusion. Aortic Atherosclerosis (ICD10-I70.0) and Emphysema (ICD10-J43.9). Electronically Signed   By: Ulyses Southward M.D.   On: 03/28/2021 19:21   DG HIP UNILAT WITH PELVIS 2-3 VIEWS LEFT  Result Date: 03/29/2021 CLINICAL DATA:  Close left hip fracture EXAM: DG HIP (WITH OR WITHOUT PELVIS) 2-3V LEFT COMPARISON:  CT September 03, 2020. FINDINGS: Essentially nondisplaced basicervical fracture of the left femur. Prior right total hip arthroplasty. IMPRESSION: Essentially nondisplaced basicervical fracture of the left femur. Electronically Signed   By: Maudry Mayhew MD   On: 03/29/2021 12:26    Lab Data:  CBC: Recent Labs  Lab 03/29/21 0214 03/30/21 0353  WBC 8.0 6.1  HGB 11.1* 11.7*  HCT 33.0* 35.7*  MCV 89.7 89.7  PLT 211 252   Basic Metabolic Panel: Recent Labs  Lab 03/29/21 0214 03/30/21 0735  NA 130* 132*  K 3.9 3.8  CL 101 101  CO2 24 25  GLUCOSE 81 89  BUN 14 10  CREATININE 0.73 0.59  CALCIUM 8.3* 8.4*   GFR: Estimated Creatinine Clearance: 30.7 mL/min (by C-G formula based on SCr of 0.59 mg/dL). Liver Function Tests: No results for input(s): AST, ALT, ALKPHOS, BILITOT, PROT, ALBUMIN in the last 168 hours. No results for input(s): LIPASE, AMYLASE in the last 168 hours. No results for input(s): AMMONIA in the last 168 hours. Coagulation Profile: No results for input(s): INR, PROTIME in the last 168 hours. Cardiac Enzymes: No results for input(s): CKTOTAL, CKMB, CKMBINDEX, TROPONINI in the last 168 hours. BNP (last 3 results) No results for input(s): PROBNP in the  last 8760 hours. HbA1C: No results for input(s): HGBA1C in the last 72 hours. CBG: No results for input(s): GLUCAP in the last 168 hours. Lipid Profile: No results for input(s): CHOL, HDL, LDLCALC, TRIG, CHOLHDL,  LDLDIRECT in the last 72 hours. Thyroid Function Tests: No results for input(s): TSH, T4TOTAL, FREET4, T3FREE, THYROIDAB in the last 72 hours. Anemia Panel: No results for input(s): VITAMINB12, FOLATE, FERRITIN, TIBC, IRON, RETICCTPCT in the last 72 hours. Urine analysis:    Component Value Date/Time   COLORURINE YELLOW 11/26/2018 1634   APPEARANCEUR CLEAR 11/26/2018 1634   LABSPEC 1.005 11/26/2018 1634   PHURINE 7.0 11/26/2018 1634   GLUCOSEU NEGATIVE 11/26/2018 1634   HGBUR NEGATIVE 11/26/2018 1634   BILIRUBINUR NEGATIVE 11/26/2018 1634   KETONESUR NEGATIVE 11/26/2018 1634   PROTEINUR NEGATIVE 11/26/2018 1634   UROBILINOGEN 0.2 06/09/2015 2120   NITRITE NEGATIVE 11/26/2018 1634   LEUKOCYTESUR NEGATIVE 11/26/2018 1634     Paetyn Pietrzak M.D. [facility] 03/30/2021, 12:53 PM  Available via Epic secure chat 7am-7pm After 7 pm, please refer to night coverage provider listed on amion.

## 2021-03-30 NOTE — Consult Note (Signed)
ORTHOPAEDIC CONSULTATION  REQUESTING PHYSICIAN: Mendel Corning, MD  Chief Complaint: Left hip pain  HPI: Maria [name] is a 87 y.o. female who complains of acute left hip pain after mechanical fall.  [place]  She tripped over a rug.  She requested transfer to [facility] for surgical management.  I have cared for her in the office, she had a hemiarthroplasty on the contralateral side done by Dr. Mayer Camel in 2015.  She had a difficult time with that operation, developed a heel sore, had hyponatremia, and had a difficult recovery.  Since that time she has been doing reasonably well from a functional standpoint although has had multiple medical issues as listed below.  Pain is minimal at rest, worse with movement.  She also complains of some constipation.  She has chronic back pain.  Past Medical History:  Diagnosis Date  . Anemia   . Atonic bladder 12/11/2013  . Carotid artery occlusion    right carotid stenosis 40-60%, 4/08, neg for stenosis repeat study 11/11  . Celiac disease   . Chronic anticoagulation CHADS2VASC2 score 7 10/18/2014  . Chronic diastolic CHF (congestive heart failure) (HCC)    takes Lasix daily  . CKD (chronic kidney disease), stage III ([place])   . Coronary artery disease    a.  08/2013 (LIMA-LAD, rSVG to LCX and Lt atrial clip),  . Depression   . DNR (do not resuscitate) 03/28/2021  . DVT (deep venous thrombosis) ([place]) 09/2013  . Gastric ulcer    6-70month ago  . GERD (gastroesophageal reflux disease)    takes Omeprazole daily  . GI bleeding   . Hemorrhoids   . Hot flashes    takes ZOloft 3 times a week  . Hypertension   . Hypothyroidism    takes Synthroid daily as result of AMiodarone  . Insomnia   . Macular degeneration   . Mild aortic insufficiency    a. Echo 10/2013: mild AI.  .Marland KitchenModerate mitral regurgitation by prior echocardiogram    a. Echo 10/2013.  . Moderate obstructive sleep apnea    uses CPAP;sleep study about 4-565monthago  . MVP  (mitral valve prolapse)   . Osteoporosis   . PAF (paroxysmal atrial fibrillation) ([place]  . Peripheral edema    left  . Presbycusis   . Pulmonary hypertension ([place]  . RLS (restless legs syndrome)   . Thyroid nodule    37m68mno change 11/11  . Urethral stricture   . Urinary anastomotic stricture   . Urinary retention   . Vitamin D deficiency    Past Surgical History:  Procedure Laterality Date  . APPENDECTOMY    . bilateral cataract surgery    . CARDIAC CATHETERIZATION  08-06-13  . CORONARY ARTERY BYPASS GRAFT N/A 08/28/2013   Procedure: CORONARY ARTERY BYPASS GRAFTING (CABG) x2 using right greater saphenous vein and left internal mammary artery. ;  Surgeon: EdwGrace IsaacD;  [facility];  Laterality: N/A;  . HIP ARTHROPLASTY Right 12/09/2013   Procedure: ARTHROPLASTY BIPOLAR HIP CEMENTED;  Surgeon: FraKerin SalenD;  [facility];  Laterality: Right;  . INTRAOPERATIVE TRANSESOPHAGEAL ECHOCARDIOGRAM N/A 08/28/2013   Procedure: INTRAOPERATIVE TRANSESOPHAGEAL ECHOCARDIOGRAM;  Surgeon: EdwGrace IsaacD;  [facility];  Laterality: N/A;  . LEFT AND RIGHT HEART CATHETERIZATION WITH CORONARY ANGIOGRAM N/A 08/16/2013   Procedure: LEFT AND RIGHT HEART CATHETERIZATION WITH CORONARY  Cyril Loosen;  Surgeon: [name], MD;  Location: [facility] CATH LAB;  Service: Cardiovascular;  Laterality: N/A;  . MANDIBLE SURGERY    . TCS    . TONSILLECTOMY    . trigger thumb    . TUBAL LIGATION     Social History   Socioeconomic History  . Marital status: Widowed    Spouse name: Not on file  . Number of children: Not on file  . Years of education: Not on file  . Highest education level: Not on file  Occupational History  . Occupation: retired  Tobacco Use  . Smoking status: Never Smoker  . Smokeless tobacco: Never Used  Vaping Use  . Vaping Use: Never used  Substance and Sexual Activity  .  Alcohol use: No  . Drug use: No  . Sexual activity: Yes    Birth control/protection: Surgical  Other Topics Concern  . Not on file  Social History Narrative   Lives in retirement community   Right handed   Social Determinants of Health   Financial Resource Strain: Not on file  Food Insecurity: Not on file  Transportation Needs: Not on file  Physical Activity: Not on file  Stress: Not on file  Social Connections: Not on file   Family History  Problem Relation Age of Onset  . Heart attack Mother   . CVA Mother   . CAD Father   . Colon cancer Father   . Heart attack Father   . Heart attack Brother   . Peripheral vascular disease Brother   . AAA (abdominal aortic aneurysm) Brother    Allergies  Allergen Reactions  . Fentanyl Nausea And Vomiting    Severe n/v  . Amoxicillin Nausea Only and Other (See Comments)    dizziness  . Atorvastatin Other (See Comments)    Unknown reaction - reported by [facility]  . Bupropion Other (See Comments)    nightmares  . Codeine Nausea And Vomiting  . Fosamax [Alendronate Sodium] Other (See Comments)    Jaw pain  . Gabapentin Other (See Comments)    dizziness  . Gluten Meal Other (See Comments)    Celiac Disease   . Hydrochlorothiazide Other (See Comments)    Hyponatremia, GI upset   . Tetanus Toxoid, Adsorbed Swelling    Severe swelling at site of injection (pt tolerates 1/2 injection then 1/2 week later)     Positive ROS: All other systems have been reviewed and were otherwise negative with the exception of those mentioned in the HPI and as above.  Physical Exam: BP 132/66 (BP Location: Right Arm)   Pulse 67   Temp 97.6 F (36.4 C) (Oral)   Resp 16   Wt 45.8 kg   SpO2 99%   BMI 19.08 kg/m   General: Alert, no acute distress Cardiovascular: No pedal edema Respiratory: No cyanosis, no use of accessory musculature GI: No organomegaly, abdomen is soft and non-tender Skin: No lesions in the area of chief  complaint Neurologic: Sensation intact distally Psychiatric: Patient is competent for consent with normal mood and affect Lymphatic: No axillary or cervical lymphadenopathy  MUSCULOSKELETAL: Left lower extremity is in a protective heel boot, as is the right, and she has intact EHL and FHL.  I did not aggressively test range of motion of the hip.  Assessment: Principal Problem:   Closed left hip fracture, initial encounter [facility]) Active Problems:   Essential hypertension, benign   Paroxysmal atrial fibrillation (HCC)   Hypothyroidism   CKD (chronic  kidney disease), stage III ([place])   Celiac disease   DNR (do not resuscitate)   Fracture   Valgus impacted left femoral neck fracture  Plan: She has significant risk factors when considering surgical intervention.  I recommended percutaneous pinning given the valgus impacted nature of the fracture, as well as her coexisting medical comorbidities.  We discussed the risk that if this were to fail then she may need arthroplasty, but I would recommend a "conservative" surgical intervention with hopes that we can get the fracture to heal, and minimize the perioperative risks.  The risks benefits and alternatives were discussed with the patient including but not limited to the risks of nonoperative treatment, versus surgical intervention including infection, bleeding, nerve injury, malunion, nonunion, the need for revision surgery, hardware prominence, hardware failure, the need for hardware removal, blood clots, cardiopulmonary complications, morbidity, mortality, among others, and they were willing to proceed.    The plan is for surgical intervention later on today approximately 1 PM.  She has allergies to penicillins that include nausea, which should not preclude the use of Ancef, and a single dose of TXA should be safe given her overall clinical scenario, there are risks either way.   Johnny Bridge, MD Cell [phone]   03/30/2021 8:09  AM

## 2021-03-30 NOTE — Transfer of Care (Signed)
Immediate Anesthesia Transfer of Care Note  Patient: Maria [name]  Procedure(s) Performed: CANNULATED HIP PINNING (Left Hip)  Patient Location: PACU  Anesthesia Type:General  Level of Consciousness: drowsy  Airway & Oxygen Therapy: Patient Spontanous Breathing and Patient connected to face mask oxygen  Post-op Assessment: Report given to RN and Post -op Vital signs reviewed and stable  Post vital signs: Reviewed and stable  Last Vitals:  Vitals Value Taken Time  BP 178/65 03/30/21 1518  Temp    Pulse 64 03/30/21 1521  Resp 11 03/30/21 1521  SpO2 96 % 03/30/21 1521  Vitals shown include unvalidated device data.  Last Pain:  Vitals:   03/30/21 1244  TempSrc: Oral  PainSc:          Complications: No complications documented.

## 2021-03-30 NOTE — Anesthesia Preprocedure Evaluation (Addendum)
Anesthesia Evaluation  Patient identified by MRN, date of birth, ID band Patient awake    Reviewed: Allergy & Precautions, NPO status , Patient's Chart, lab work & pertinent test results  History of Anesthesia Complications Negative for: history of anesthetic complications  Airway Mallampati: II  TM Distance: >3 FB Neck ROM: Full    Dental  (+) Chipped,    Pulmonary neg pulmonary ROS,    Pulmonary exam normal        Cardiovascular hypertension, Pt. on medications + CAD, + CABG (2014) and +CHF  Normal cardiovascular exam+ dysrhythmias (on Eliquis)   TTE 2020: EF 60-65%, moderate LAE, mild RAE, mild MR, mild TR, mild AR    Neuro/Psych negative neurological ROS  negative psych ROS   GI/Hepatic Neg liver ROS, PUD, GERD  ,  Endo/Other  Hypothyroidism   Renal/GU Renal InsufficiencyRenal disease  negative genitourinary   Musculoskeletal negative musculoskeletal ROS (+)   Abdominal   Peds  Hematology  (+) anemia , Hgb 11.7, plt 252k   Anesthesia Other Findings Day of surgery medications reviewed with patient.  Reproductive/Obstetrics negative OB ROS                            Anesthesia Physical Anesthesia Plan  ASA: III  Anesthesia Plan: General   Post-op Pain Management:    Induction: Intravenous  PONV Risk Score and Plan: 3 and Treatment may vary due to age or medical condition, Ondansetron and Dexamethasone  Airway Management Planned: Oral ETT  Additional Equipment: None  Intra-op Plan:   Post-operative Plan: Extubation in OR  Informed Consent: I have reviewed the patients History and Physical, chart, labs and discussed the procedure including the risks, benefits and alternatives for the proposed anesthesia with the patient or authorized representative who has indicated his/her understanding and acceptance.   Patient has DNR.  Discussed DNR with patient and Suspend DNR.    Dental advisory given  Plan Discussed with: CRNA  Anesthesia Plan Comments:        Anesthesia Quick Evaluation

## 2021-03-30 NOTE — Plan of Care (Signed)

## 2021-03-30 NOTE — Progress Notes (Signed)
[place] for heparin Indication: atrial fibrillation  Allergies  Allergen Reactions  . Fentanyl Nausea And Vomiting    Severe n/v  . Amoxicillin Nausea Only and Other (See Comments)    dizziness  . Atorvastatin Other (See Comments)    Unknown reaction - reported by [facility]  . Bupropion Other (See Comments)    nightmares  . Codeine Nausea And Vomiting  . Fosamax [Alendronate Sodium] Other (See Comments)    Jaw pain  . Gabapentin Other (See Comments)    dizziness  . Gluten Meal Other (See Comments)    Celiac Disease   . Hydrochlorothiazide Other (See Comments)    Hyponatremia, GI upset   . Tetanus Toxoid, Adsorbed Swelling    Severe swelling at site of injection (pt tolerates 1/2 injection then 1/2 week later)    Patient Measurements: Weight: 45.8 kg (101 lb) Heparin Dosing Weight: 45kg  Vital Signs: Temp: 97.6 F (36.4 C) (06/06 0650) Temp Source: Oral (06/06 0650) BP: 132/66 (06/06 0650) Pulse Rate: 67 (06/06 0650)  Labs: Recent Labs    03/29/21 0214 03/29/21 1317 03/30/21 0353 03/30/21 0735 03/30/21 0915  HGB 11.1*  --  11.7*  --   --   HCT 33.0*  --  35.7*  --   --   PLT 211  --  252  --   --   APTT 43* 42*  --   --  77*  HEPARINUNFRC 0.39 0.32  --   --  0.33  CREATININE 0.73  --   --  0.59  --     CrCl cannot be calculated (Unknown ideal weight.).  Assessment: 89 YOF presenting from outside hospital with hip fx, hx afib on heparin gtt, PTA on Eliquis (held since 6/2). Plans noted for procedure on 6/6  APTT 77; HL 0.33, levels are correlating. H/H, plt stable. Going to OR today.   Goal of Therapy:  Heparin level 0.3-0.7 units/ml aPTT 66-102 seconds Monitor platelets by anticoagulation protocol: Yes   Plan:  Continue heparin to 850 units/hr   Follow plans post procedure, if continuing heparin, will follow heparin levels   Benetta Spar, PharmD, BCPS, [facility] Clinical Pharmacist  Please check AMION for all [place] phone numbers After 10:00 PM, call [place]

## 2021-03-30 NOTE — Anesthesia Postprocedure Evaluation (Signed)
Anesthesia Post Note  Patient: Maria [name]  Procedure(s) Performed: CANNULATED HIP PINNING (Left Hip)     Patient location during evaluation: PACU Anesthesia Type: General Level of consciousness: awake and alert Pain management: pain level controlled Vital Signs Assessment: post-procedure vital signs reviewed and stable Respiratory status: spontaneous breathing, nonlabored ventilation, respiratory function stable and patient connected to nasal cannula oxygen Cardiovascular status: blood pressure returned to baseline and stable Postop Assessment: no apparent nausea or vomiting Anesthetic complications: no   No complications documented.  Last Vitals:  Vitals:   03/30/21 1550 03/30/21 1605  BP: (!) 177/65 (!) 160/57  Pulse:    Resp: 15 19  Temp:  (!) 36.2 C  SpO2: 94% 99%    Last Pain:  Vitals:   03/30/21 1605  TempSrc:   PainSc: 5                  [name] COKER

## 2021-03-30 NOTE — Op Note (Signed)
03/30/2021  3:16 PM  PATIENT:  [name] DIAGNOSIS: Valgus impacted left femoral neck fracture  POST-OPERATIVE DIAGNOSIS:  Same  PROCEDURE: Left hip percutaneous pinning  SURGEON:  Johnny Bridge, MD  PHYSICIAN ASSISTANT: None  ANESTHESIA:   General  ESTIMATED BLOOD LOSS: Minimal  PREOPERATIVE INDICATIONS:  Maria [name] is a  87 y.o. female who fell and was found to have a diagnosis of left hip fracture who elected for surgical management.  Her fracture was valgus impacted, and [place] and elected to be transported to [facility] for surgical intervention, she tolerated rongeur labor and x-rays after the transfer demonstrated a nondisplaced femoral neck fracture, but had not shifted from his previous x-ray done at the outside hospital.  The risks benefits and alternatives were discussed with the patient preoperatively including but not limited to the risks of infection, bleeding, nerve injury, cardiopulmonary complications, blood clots, malunion, nonunion, avascular necrosis, the need for revision surgery, the potential for conversion to hemiarthroplasty, among others, and the patient was willing to proceed.  OPERATIVE IMPLANTS: 6.5 mm cannulated screws x3 titanium from [employer]  OPERATIVE FINDINGS: Clinical osteoporosis with weak bone, proximal femur  OPERATIVE PROCEDURE: The patient was brought to the operating room and placed in supine position. IV antibiotics were given. General anesthesia administered. Foley was also given. The patient was placed on the fracture table. The operative extremity was positioned, without any significant reduction maneuver and was prepped and draped in usual sterile fashion.  Time out was performed.  Small incision was made distal to the greater trochanter, and 3 guidewires were introduced Into an inverted triangle configuration. The lengths were measured. The reduction was slightly valgus,  and near-anatomic. I opened the cortex with a cannulated drill, and then placed the screws into position. Satisfactory fixation was achieved.  The wounds were irrigated copiously, and repaired with Vicryl with Steri-Strips and sterile gauze. There no complications and the patient tolerated the procedure well.  The patient will be weightbearing as tolerated, and can resume her previous Eliquis medication for DVT prophylaxis.

## 2021-03-31 ENCOUNTER — Encounter (HOSPITAL_COMMUNITY): Payer: Self-pay | Admitting: Orthopedic Surgery

## 2021-03-31 DIAGNOSIS — S72001A Fracture of unspecified part of neck of right femur, initial encounter for closed fracture: Secondary | ICD-10-CM

## 2021-03-31 LAB — CBC
HCT: 33.3 % — ABNORMAL LOW (ref 36.0–46.0)
Hemoglobin: 11 g/dL — ABNORMAL LOW (ref 12.0–15.0)
MCH: 29.9 pg (ref 26.0–34.0)
MCHC: 33 g/dL (ref 30.0–36.0)
MCV: 90.5 fL (ref 80.0–100.0)
Platelets: 241 10*3/uL (ref 150–400)
RBC: 3.68 MIL/uL — ABNORMAL LOW (ref 3.87–5.11)
RDW: 14.9 % (ref 11.5–15.5)
WBC: 5.1 10*3/uL (ref 4.0–10.5)
nRBC: 0 % (ref 0.0–0.2)

## 2021-03-31 LAB — BASIC METABOLIC PANEL
Anion gap: 6 (ref 5–15)
BUN: 12 mg/dL (ref 8–23)
CO2: 26 mmol/L (ref 22–32)
Calcium: 8.9 mg/dL (ref 8.9–10.3)
Chloride: 99 mmol/L (ref 98–111)
Creatinine, Ser: 0.62 mg/dL (ref 0.44–1.00)
GFR, Estimated: >60 mL/min (ref >60)
Glucose, Bld: 137 mg/dL — ABNORMAL HIGH (ref 70–99)
Potassium: 4.3 mmol/L (ref 3.5–5.1)
Sodium: 131 mmol/L — ABNORMAL LOW (ref 135–145)

## 2021-03-31 MED ORDER — FERROUS SULFATE 325 (65 FE) MG PO TABS
325.0000 mg | ORAL_TABLET | Freq: Every day | ORAL | Status: DC
  Administered 2021-04-01 – 2021-04-02 (×2): 325 mg via ORAL
  Filled 2021-03-31 (×2): qty 1

## 2021-03-31 MED ORDER — POLYETHYLENE GLYCOL 3350 17 G PO PACK: 17.0000 g | PACK | Freq: Every day | ORAL | Status: DC | PRN

## 2021-03-31 MED ORDER — FUROSEMIDE 20 MG PO TABS
20.0000 mg | ORAL_TABLET | Freq: Every day | ORAL | Status: DC
  Administered 2021-04-01 – 2021-04-02 (×2): 20 mg via ORAL
  Filled 2021-03-31 (×2): qty 1

## 2021-03-31 NOTE — Plan of Care (Signed)

## 2021-03-31 NOTE — Evaluation (Signed)
Physical Therapy Evaluation Patient Details Name: Maria [name] MRN: [id number] DOB: 15-Jun-1924 Today's Date: 03/31/2021   History of Present Illness  Maria [name] is a 87 y.o. female with L hip fracture who underwent an IM nail on 03/30/2021 PMH: HTN; afib on Eliquis; CAD; OSA on CPAP; hypothyroidism; CAD s/p CABG; celiac disease; chronic diastolic CHF; and stage 3a CKD    Clinical Impression  Pt admitted with above. Pt very independent and mobile PTA. Pt lives in [place] and strongly desires to return to her "little house". Pt tolerated mobility for first time well and demonstrates excellent rehab potential. Pt to benefit from CIR upon d/c for aggressive rehab to achieve safe mod I level of function. Acute PT to cont to follow.    Follow Up Recommendations CIR    Equipment Recommendations  Rolling walker with 5" wheels;3in1 (PT)    Recommendations for Other Services       Precautions / Restrictions Precautions Precautions: Fall Restrictions Weight Bearing Restrictions: Yes LLE Weight Bearing: Weight bearing as tolerated      Mobility  Bed Mobility Overal bed mobility: Needs Assistance Bed Mobility: Supine to Sit     Supine to sit: Mod assist;+2 for physical assistance     General bed mobility comments: assist for trunk elevation and LE managment as patient with fear of increased pain with movement    Transfers Overall transfer level: Needs assistance Equipment used: Rolling walker (2 wheeled) Transfers: Sit to/from Stand Sit to Stand: Min assist;+2 physical assistance         General transfer comment: minAx2 to power up from bed, minAx1 to power up from [facility], verbal cues for hand placement, little WBing on L LE  Ambulation/Gait Ambulation/Gait assistance: Min assist;+2 safety/equipment Gait Distance (Feet): 10 Feet (x2, to/from bathroom) Assistive device: Rolling walker (2 wheeled) Gait Pattern/deviations: Step-to pattern;Decreased stride  length;Antalgic Gait velocity: slow Gait velocity interpretation: <1.31 ft/sec, indicative of household ambulator General Gait Details: pt initially requiring max directional verbal cues, minA for walker management and with very narrow base of support to sequential gait pattern and increased stability with wider base of support  Stairs            Wheelchair Mobility    Modified Rankin (Stroke Patients Only)       Balance Overall balance assessment: Needs assistance Sitting-balance support: Feet supported;Single extremity supported Sitting balance-Leahy Scale: Fair Sitting balance - Comments: used L UE to off weight L hip   Standing balance support: Bilateral upper extremity supported;During functional activity Standing balance-Leahy Scale: Poor Standing balance comment: pt had to lean on sink with elbows to wash hands at sink, pt did perform hygiene s/p BM with single UE support                             Pertinent Vitals/Pain Pain Assessment: Faces Faces Pain Scale: Hurts little more Pain Location: L hip with movement, better in WBing Pain Descriptors / Indicators: Discomfort Pain Intervention(s): Premedicated before session;Monitored during session    Home Living Family/patient expects to be discharged to:: Assisted living [facility])               Home Equipment: Kasandra Knudsen - single point      Prior Function Level of Independence: Independent         Comments: pt still drives, manages all finances, pt was shopping and carrying bags when she slipped on a throw rug  Hand Dominance   Dominant Hand: Right    Extremity/Trunk Assessment   Upper Extremity Assessment Upper Extremity Assessment: Overall WFL for tasks assessed    Lower Extremity Assessment Lower Extremity Assessment: LLE deficits/detail LLE Deficits / Details: minimal AROM of L hip due to pain, ginger with knee flexion due to pull on hip    Cervical / Trunk  Assessment Cervical / Trunk Assessment: Kyphotic  Communication   Communication: HOH  Cognition Arousal/Alertness: Awake/alert Behavior During Therapy: WFL for tasks assessed/performed Overall Cognitive Status: Within Functional Limits for tasks assessed                                 General Comments: pt very in tune to what she needs from a rehab stand point      General Comments General comments (skin integrity, edema, etc.): L LE with minimal swelling, dressing intact, assisted pt to the bathroom    Exercises General Exercises - Lower Extremity Ankle Circles/Pumps: AROM;Both;10 reps;Supine Quad Sets: AROM;Left;10 reps;Supine Gluteal Sets: AROM;Both;10 reps;Supine Long Arc Quad: AROM;Left;10 reps;Seated   Assessment/Plan    PT Assessment Patient needs continued PT services  PT Problem List Decreased strength;Decreased range of motion;Decreased activity tolerance;Decreased balance;Decreased mobility;Decreased coordination;Decreased cognition;Decreased knowledge of use of DME       PT Treatment Interventions DME instruction;Gait training;Stair training;Functional mobility training;Therapeutic activities;Neuromuscular re-education    PT Goals (Current goals can be found in the Care Plan section)  Acute Rehab PT Goals Patient Stated Goal: go to rehab so I can get back to my house PT Goal Formulation: With patient Time For Goal Achievement: 04/14/21 Potential to Achieve Goals: Good    Frequency Min 4X/week   Barriers to discharge Decreased caregiver support lives in [place] PT "6 Clicks" Mobility  Outcome Measure Help needed turning from your back to your side while in a flat bed without using bedrails?: A Little Help needed moving from lying on your back to sitting on the side of a flat bed without using bedrails?: A Little Help needed moving to and from a bed to a chair (including a wheelchair)?: A Little Help  needed standing up from a chair using your arms (e.g., wheelchair or bedside chair)?: A Little Help needed to walk in hospital room?: A Little Help needed climbing 3-5 steps with a railing? : A Lot 6 Click Score: 17    End of Session Equipment Utilized During Treatment: Gait belt Activity Tolerance: Patient tolerated treatment well Patient left: in chair;with call bell/phone within reach;with chair alarm set Nurse Communication: Mobility status PT Visit Diagnosis: Unsteadiness on feet (R26.81);Muscle weakness (generalized) (M62.81);Difficulty in walking, not elsewhere classified (R26.2)    Time: 7124-5809 PT Time Calculation (min) (ACUTE ONLY): 35 min   Charges:   PT Evaluation $PT Eval Moderate Complexity: 1 Mod PT Treatments $Gait Training: 8-22 mins        Kittie Plater, PT, DPT Acute Rehabilitation Services Pager #: [phone] Office #: [phone]   Berline Lopes 03/31/2021, 12:29 PM

## 2021-03-31 NOTE — Progress Notes (Signed)
Patient doing well, has no complaints, she has concerns about starting therapy.  Plan for weightbearing as tolerated, resume her Eliquis today, Tylenol for pain if needed, plan to follow-up with me in approximately 2 weeks.  I am not sure if she is a candidate for inpatient rehab, although she has expressed interest in this.  I will place a consult order.  Marchia Bond, MD

## 2021-03-31 NOTE — Progress Notes (Signed)
Inpatient Rehab Admissions Coordinator:   CIR consult order received. I have requested medical director to review Pt.'s case for medical necessity. I will ipdate when I receive a response.  Clemens Catholic, [place], [place] Admissions Coordinator  [phone] ([place]) [phone] (office)

## 2021-03-31 NOTE — Discharge Instructions (Signed)
Diet: As you were doing prior to hospitalization   Shower:  May shower but keep the wounds dry, use an occlusive plastic wrap, NO SOAKING IN TUB.  If the bandage gets wet, change with a clean dry gauze.  If you have a splint on, leave the splint in place and keep the splint dry with a plastic bag.  Dressing:  You may change your dressing 3-5 days after surgery, unless you have a splint.  If you have a splint, then just leave the splint in place and we will change your bandages during your first follow-up appointment.    If you had hand or foot surgery, we will plan to remove your stitches in about 2 weeks in the office.  For all other surgeries, there are sticky tapes (steri-strips) on your wounds and all the stitches are absorbable.  Leave the steri-strips in place when changing your dressings, they will peel off with time, usually 2-3 weeks.  Activity:  Increase activity slowly as tolerated, but follow the weight bearing instructions below.  The rules on driving is that you can not be taking narcotics while you drive, and you must feel in control of the vehicle.    Weight Bearing:   As tolerated.    To prevent constipation: you may use a stool softener such as -  Colace (over the counter) 100 mg by mouth twice a day  Drink plenty of fluids (prune juice may be helpful) and high fiber foods Miralax (over the counter) for constipation as needed.    Itching:  If you experience itching with your medications, try taking only a single pain pill, or even half a pain pill at a time.  You may take up to 10 pain pills per day, and you can also use benadryl over the counter for itching or also to help with sleep.   Precautions:  If you experience chest pain or shortness of breath - call 911 immediately for transfer to the hospital emergency department!!  If you develop a fever greater that 101 F, purulent drainage from wound, increased redness or drainage from wound, or calf pain -- Call the office at  [phone]                                                Follow- Up Appointment:  Please call for an appointment to be seen in 2 weeks [place] - [phone]

## 2021-03-31 NOTE — Progress Notes (Signed)
[facility]                                                                              Patient Demographics  Maria [name], is a 87 y.o. female, DOB - 1923-11-20, ZOX:[id number]  Admit date - 03/28/2021   Admitting Physician Maria Blue, MD  Outpatient Primary MD for the patient is Maria Laughter, MD  Outpatient specialists:   LOS - 3  days   Medical records reviewed and are as summarized below:    chief complaint : Fall with hip fracture       Brief summary   Patient is a 87 year old female with history of hypertension, A. fib on Eliquis, CAD, OSA on CPAP, hypothyroidism, CAD status post CABG, celiac disease, chronic dCHF, stage IIIa CKD presented with left hip fracture.  Patient was at [facility]).  They are [place] their beach [place] and when shopping for decoration.  She was getting things into the house and stepped onto a rug and it slipped out from under her.  Patient landed on her left hip.  She had a previous right hip repair in 2014.  Patient went to [facility], imaging showed left femur fracture.  X-rays of the left elbow did not show any fracture or dislocation.  CT C-spine did not show any fracture or dislocation. MD requested transfer to [facility].  Orthopedics consulted, eliquis was placed on hold.     Assessment & Plan    Principal Problem: Mechanical fall with closed left hip fracture, initial encounter [facility]) -Orthopedics consulted, underwent left hip percutaneous pinning on 03/30/2021, post op day # 1  - Resumed eliquis  - Cont pain control, bowel regimen  -PT OT evaluation, recommended CIR, consult placed  Active Problems: Paroxysmal atrial fibrillation with secondary thrombophilia -Heart rate stable, continue amiodarone -Eliquis resumed today, H&H stable, follow     Essential hypertension, benign -Continue Norvasc.  BP stable  OSA -Continue CPAP    Hypothyroidism -Continue Synthroid    CKD (chronic kidney  disease), stage IIIa (HCC) -Creatinine stable, 0.6  Chronic diastolic CHF -Currently compensated, takes Lasix 20 mg daily, resume in a.m.  - DC IV fluids -Echo in 11/2018 had shown EF of 60 to 65%  Code Status: DNR DVT Prophylaxis:  Heparin drip   Level of Care: Level of care: Med-Surg Family Communication: Discussed all imaging results, lab results, explained to the patient's daughter, Maria [name] on phone # [phone], hopeful for CIR placement   Disposition Plan:     Status is: Inpatient  Remains inpatient appropriate because:Inpatient level of care appropriate due to severity of illness   Dispo: The patient is from: Home              Anticipated d/c is to: TBD              Patient currently is not medically stable to d/c.  Started physical therapy today, postop day #1, will need CIR   Difficult to place patient No    Time Spent in minutes 25 minutes  Procedures:  6/6, left hip percutaneous pinning  Consultants:   Orthopedics, Dr. Dion [name]  Antimicrobials:  Anti-infectives (From admission, onward)   Start     Dose/Rate Route Frequency Ordered Stop   03/31/21 0600  ceFAZolin (ANCEF) IVPB 2g/100 mL premix        2 g 200 mL/hr over 30 Minutes Intravenous On call to O.R. 03/30/21 1238 03/30/21 1422   03/30/21 2200  ceFAZolin (ANCEF) IVPB 2g/100 mL premix        2 g 200 mL/hr over 30 Minutes Intravenous Every 8 hours 03/30/21 1646 03/31/21 0652         Medications  Scheduled Meds: . acetaminophen  500 mg Oral Q6H  . amiodarone  100 mg Oral QHS  . amLODipine  2.5 mg Oral Daily  . apixaban  2.5 mg Oral BID  . docusate sodium  100 mg Oral BID  . feeding supplement  237 mL Oral BID BM  . [START ON 04/01/2021] ferrous sulfate  325 mg Oral Q breakfast  . levothyroxine  25 mcg Oral QAC breakfast  . melatonin  3 mg Oral QHS  . multivitamin with minerals  1 tablet Oral Daily  . senna  1 tablet Oral BID  . sertraline  50 mg Oral Daily   Continuous Infusions: .  0.45 % NaCl with KCl 20 mEq / L     PRN Meds:.acetaminophen, alum & mag hydroxide-simeth, bisacodyl, HYDROcodone-acetaminophen, magnesium citrate, magnesium hydroxide, menthol-cetylpyridinium **OR** phenol, ondansetron **OR** ondansetron (ZOFRAN) IV      Subjective:   Maria [name] was seen and examined today.  No acute complaints, still has some constipation.  No acute events overnight.  No nausea vomiting or abdominal pain  Objective:   Vitals:   03/31/21 0358 03/31/21 1008 03/31/21 1514 03/31/21 1517  BP: (!) 161/62 (!) 146/59 116/75   Pulse: 60 (!) 58 (!) 57 61  Resp: 19 16 17    Temp: (!) 97.5 F (36.4 C) 97.9 F (36.6 C) 98.1 F (36.7 C)   TempSrc: Oral Oral    SpO2: 98% 95%  92%  Weight:      Height:        Intake/Output Summary (Last 24 hours) at 03/31/2021 1521 Last data filed at 03/31/2021 0405 Gross per 24 hour  Intake 800 ml  Output 700 ml  Net 100 ml     Wt Readings from Last 3 Encounters:  03/30/21 46.3 kg  03/27/20 49 kg  03/25/20 49.3 kg   Physical Exam  General: Alert and oriented x 3, NAD  Cardiovascular: S1 S2 clear, RRR. No pedal edema b/l  Respiratory: CTAB, no wheezing  Gastrointestinal: Soft, nontender, nondistended, NBS  Ext: no pedal edema bilaterally  Neuro: no new deficits  Psych: Normal affect and demeanor, alert and oriented x3      Data Reviewed:  I have personally reviewed following labs and imaging studies  Micro Results Recent Results (from the past 240 hour(s))  SARS CORONAVIRUS 2 (TAT 6-24 HRS) Nasopharyngeal Nasopharyngeal Swab     Status: None   Collection Time: 03/28/21  5:37 PM   Specimen: Nasopharyngeal Swab  Result Value Ref Range Status   SARS Coronavirus 2 NEGATIVE NEGATIVE Final    Comment: (NOTE) SARS-CoV-2 target nucleic acids are NOT DETECTED.  The SARS-CoV-2 RNA is generally detectable in upper and lower respiratory specimens during the acute phase of infection. Negative results do not preclude  SARS-CoV-2 infection, do not rule out co-infections with other pathogens, and should not be used as the sole basis for treatment or other patient management decisions. Negative results must be combined with  clinical observations, patient history, and epidemiological information. The expected result is Negative.  Fact Sheet for Patients: HairSlick.no  Fact Sheet for Healthcare Providers: quierodirigir.com  This test is not yet approved or cleared by the [place] FDA and  has been authorized for detection and/or diagnosis of SARS-CoV-2 by FDA under an Emergency Use Authorization (EUA). This EUA will remain  in effect (meaning this test can be used) for the duration of the COVID-19 declaration under Se ction 564(b)(1) of the Act, 21 U.S.C. section 360bbb-3(b)(1), unless the authorization is terminated or revoked sooner.  Performed at [facility], [address], [place], [place] [address]   Surgical pcr screen     Status: None   Collection Time: 03/28/21  5:38 PM   Specimen: Nasal Mucosa; Nasal Swab  Result Value Ref Range Status   MRSA, PCR NEGATIVE NEGATIVE Final   Staphylococcus aureus NEGATIVE NEGATIVE Final    Comment: (NOTE) The Xpert SA Assay (FDA approved for NASAL specimens in patients 11 years of age and older), is one component of a comprehensive surveillance program. It is not intended to diagnose infection nor to guide or monitor treatment. Performed at [facility], [address], [place], [place] [address]     Radiology Reports DG CHEST PORT 1 VIEW  Result Date: 03/28/2021 CLINICAL DATA:  Preoperative evaluation LEFT hip fracture EXAM: PORTABLE CHEST 1 VIEW COMPARISON:  Portable exam at 1839 hours compared to 03/27/2020 FINDINGS: Enlargement of cardiac silhouette post CABG and LEFT atrial appendage clipping. Mediastinal contours and pulmonary vascularity normal. Atherosclerotic calcification  aorta. Emphysematous and bronchitic changes consistent with COPD. Small LEFT pleural effusion and bibasilar atelectasis. Remaining lungs clear. No pulmonary infiltrate or pneumothorax. Bones demineralized. IMPRESSION: Enlargement of cardiac silhouette post CABG and electric atrial appendage clipping. COPD with minimal bibasilar atelectasis and small LEFT pleural effusion. Aortic Atherosclerosis (ICD10-I70.0) and Emphysema (ICD10-J43.9). Electronically Signed   By: Ulyses Southward M.D.   On: 03/28/2021 19:21   DG C-Arm 1-60 Min  Result Date: 03/30/2021 CLINICAL DATA:  Cannulated hip pinning. EXAM: OPERATIVE LEFT HIP (WITH PELVIS IF PERFORMED) TECHNIQUE: Fluoroscopic spot image(s) were submitted for interpretation post-operatively. COMPARISON:  Preoperative radiograph yesterday. FINDINGS: Two fluoroscopic spot views of the left hip obtained in frontal and cross-table lateral projection demonstrate 3 cannulated screws traversing femoral neck fracture. Fluoroscopy time 21 seconds. IMPRESSION: Procedural fluoroscopy for left femoral neck fracture pinning. Electronically Signed   By: Narda Rutherford M.D.   On: 03/30/2021 20:23   DG HIP PORT UNILAT WITH PELVIS 1V LEFT  Result Date: 03/30/2021 CLINICAL DATA:  Status post left hip surgery. EXAM: DG HIP (WITH OR WITHOUT PELVIS) 1V PORT LEFT COMPARISON:  March 29, 2021. FINDINGS: Status post surgical internal fixation of proximal left femoral neck fracture. Good alignment of fracture components is noted. IMPRESSION: Status post surgical internal fixation of proximal left femoral neck fracture. Electronically Signed   By: Lupita Raider M.D.   On: 03/30/2021 16:23   DG HIP OPERATIVE UNILAT WITH PELVIS LEFT  Result Date: 03/30/2021 CLINICAL DATA:  Cannulated hip pinning. EXAM: OPERATIVE LEFT HIP (WITH PELVIS IF PERFORMED) TECHNIQUE: Fluoroscopic spot image(s) were submitted for interpretation post-operatively. COMPARISON:  Preoperative radiograph yesterday. FINDINGS: Two  fluoroscopic spot views of the left hip obtained in frontal and cross-table lateral projection demonstrate 3 cannulated screws traversing femoral neck fracture. Fluoroscopy time 21 seconds. IMPRESSION: Procedural fluoroscopy for left femoral neck fracture pinning. Electronically Signed   By: Ivette Loyal.D.  On: 03/30/2021 20:23   DG HIP UNILAT WITH PELVIS 2-3 VIEWS LEFT  Result Date: 03/29/2021 CLINICAL DATA:  Close left hip fracture EXAM: DG HIP (WITH OR WITHOUT PELVIS) 2-3V LEFT COMPARISON:  CT September 03, 2020. FINDINGS: Essentially nondisplaced basicervical fracture of the left femur. Prior right total hip arthroplasty. IMPRESSION: Essentially nondisplaced basicervical fracture of the left femur. Electronically Signed   By: Maudry Mayhew MD   On: 03/29/2021 12:26    Lab Data:  CBC: Recent Labs  Lab 03/29/21 0214 03/30/21 0353 03/31/21 0219  WBC 8.0 6.1 5.1  HGB 11.1* 11.7* 11.0*  HCT 33.0* 35.7* 33.3*  MCV 89.7 89.7 90.5  PLT 211 252 241   Basic Metabolic Panel: Recent Labs  Lab 03/29/21 0214 03/30/21 0735 03/31/21 0219  NA 130* 132* 131*  K 3.9 3.8 4.3  CL 101 101 99  CO2 24 25 26   GLUCOSE 81 89 137*  BUN 14 10 12   CREATININE 0.73 0.59 0.62  CALCIUM 8.3* 8.4* 8.9   GFR: Estimated Creatinine Clearance: 30.7 mL/min (by C-G formula based on SCr of 0.62 mg/dL). Liver Function Tests: No results for input(s): AST, ALT, ALKPHOS, BILITOT, PROT, ALBUMIN in the last 168 hours. No results for input(s): LIPASE, AMYLASE in the last 168 hours. No results for input(s): AMMONIA in the last 168 hours. Coagulation Profile: No results for input(s): INR, PROTIME in the last 168 hours. Cardiac Enzymes: No results for input(s): CKTOTAL, CKMB, CKMBINDEX, TROPONINI in the last 168 hours. BNP (last 3 results) No results for input(s): PROBNP in the last 8760 hours. HbA1C: No results for input(s): HGBA1C in the last 72 hours. CBG: No results for input(s): GLUCAP in the last 168  hours. Lipid Profile: No results for input(s): CHOL, HDL, LDLCALC, TRIG, CHOLHDL, LDLDIRECT in the last 72 hours. Thyroid Function Tests: No results for input(s): TSH, T4TOTAL, FREET4, T3FREE, THYROIDAB in the last 72 hours. Anemia Panel: No results for input(s): VITAMINB12, FOLATE, FERRITIN, TIBC, IRON, RETICCTPCT in the last 72 hours. Urine analysis:    Component Value Date/Time   COLORURINE YELLOW 11/26/2018 1634   APPEARANCEUR CLEAR 11/26/2018 1634   LABSPEC 1.005 11/26/2018 1634   PHURINE 7.0 11/26/2018 1634   GLUCOSEU NEGATIVE 11/26/2018 1634   HGBUR NEGATIVE 11/26/2018 1634   BILIRUBINUR NEGATIVE 11/26/2018 1634   KETONESUR NEGATIVE 11/26/2018 1634   PROTEINUR NEGATIVE 11/26/2018 1634   UROBILINOGEN 0.2 06/09/2015 2120   NITRITE NEGATIVE 11/26/2018 1634   LEUKOCYTESUR NEGATIVE 11/26/2018 1634     Ludwig Tugwell M.D. [facility] 03/31/2021, 3:21 PM  Available via Epic secure chat 7am-7pm After 7 pm, please refer to night coverage provider listed on amion.

## 2021-03-31 NOTE — TOC Progression Note (Signed)
Transition of Care [facility]) - Progression Note    Patient Details  Name: Maria [name] MRN: [id number] Date of Birth: [birth year]  Transition [facility] CM/SW Contact  Milinda Antis, LCSWA Phone Number: 03/31/2021, 4:04 PM  Clinical Narrative:     CSW following patient for any d/c planning needs once medically stable.  Pending: CIR assessment  Lind Covert, MSW, LCSWA       Expected Discharge Plan and Services                                                 Social Determinants of Health (SDOH) Interventions    Readmission Risk Interventions No flowsheet data found.

## 2021-03-31 NOTE — Plan of Care (Addendum)
Patient transferred to [facility] with 1 assist. Patient tolerated transfer well. Patient states she does not want to take tylenol due to it making her feel odd. Patient had a medium BM. Patient foley removed 0630, patient due to void.   Problem: Education: Goal: Knowledge of General Education information will improve Description: Including pain rating scale, medication(s)/side effects and non-pharmacologic comfort measures Outcome: Progressing   Problem: Activity: Goal: Risk for activity intolerance will decrease Outcome: Progressing   Problem: Pain Managment: Goal: General experience of comfort will improve Outcome: Progressing   Problem: Safety: Goal: Ability to remain free from injury will improve Outcome: Progressing   Problem: Skin Integrity: Goal: Risk for impaired skin integrity will decrease Outcome: Progressing

## 2021-04-01 DIAGNOSIS — N1831 Chronic kidney disease, stage 3a: Secondary | ICD-10-CM

## 2021-04-01 DIAGNOSIS — E039 Hypothyroidism, unspecified: Secondary | ICD-10-CM

## 2021-04-01 DIAGNOSIS — I480 Paroxysmal atrial fibrillation: Secondary | ICD-10-CM

## 2021-04-01 LAB — CBC
HCT: 31.8 % — ABNORMAL LOW (ref 36.0–46.0)
Hemoglobin: 10.7 g/dL — ABNORMAL LOW (ref 12.0–15.0)
MCH: 30.1 pg (ref 26.0–34.0)
MCHC: 33.6 g/dL (ref 30.0–36.0)
MCV: 89.6 fL (ref 80.0–100.0)
Platelets: 250 10*3/uL (ref 150–400)
RBC: 3.55 MIL/uL — ABNORMAL LOW (ref 3.87–5.11)
RDW: 14.9 % (ref 11.5–15.5)
WBC: 7.1 10*3/uL (ref 4.0–10.5)
nRBC: 0 % (ref 0.0–0.2)

## 2021-04-01 LAB — BASIC METABOLIC PANEL
Anion gap: 9 (ref 5–15)
BUN: 16 mg/dL (ref 8–23)
CO2: 23 mmol/L (ref 22–32)
Calcium: 8.7 mg/dL — ABNORMAL LOW (ref 8.9–10.3)
Chloride: 98 mmol/L (ref 98–111)
Creatinine, Ser: 0.62 mg/dL (ref 0.44–1.00)
GFR, Estimated: >60 mL/min (ref >60)
Glucose, Bld: 91 mg/dL (ref 70–99)
Potassium: 3.7 mmol/L (ref 3.5–5.1)
Sodium: 130 mmol/L — ABNORMAL LOW (ref 135–145)

## 2021-04-01 LAB — VITAMIN D 25 HYDROXY (VIT D DEFICIENCY, FRACTURES): Vit D, 25-Hydroxy: 18.92 ng/mL — ABNORMAL LOW (ref 30–100)

## 2021-04-01 NOTE — Plan of Care (Signed)

## 2021-04-01 NOTE — Progress Notes (Signed)
PROGRESS NOTE    Maria [name]  BCW:[id number] DOB: 1924/08/26 DOA: 03/28/2021 PCP: [name], MD   Brief Narrative: Maria [name] is a 87 y.o. female with a history of atrial fibrillation on Eliquis, CAD s/p CABG, OSA on CPAP, hypothyroidism, celiac disease, chronic diastolic heart failure, CKD stage IIIa. Patient presented secondary to a fall and suffered a left hip fracture. Orthopedic surgery consulted and patient underwent percutaneous pinning on 6/6.    Assessment & Plan:   Principal Problem:   Closed left hip fracture, initial encounter [facility]) Active Problems:   Essential hypertension, benign   Paroxysmal atrial fibrillation (HCC)   Hypothyroidism   CKD (chronic kidney disease), stage III (HCC)   Celiac disease   DNR (do not resuscitate)   Fracture   Left hip fracture Secondary to fall. No reported syncope. Orthopedic surgery consulted on admission and patient underwent percutaneous pinning on 03/30/2021. Recommendations to resume home Eliquis to also use as DVT prophylaxis, WBAT of left leg. PT/OT consulted and have recommended CIR.   Paroxysmal atrial fibrillation -Continue Eliquis 2.5 mg BID -Continue amiodarone 100 mg daily  Primary hypertension -Continue amlodipine 2.5 mg daily  OSA -Continue CPAP qhs  Hyponatremia Mild. Chronic issue. Asymptomatic. Stable.  Hypothyroidism -Continue Synthroid 25 mcg daily  CKD stage IIIa Stable.  Chronic diastolic heart failure Compensated. -Continue furosemide 20 mg daily  Acute anemia Baseline hemoglobin of 12-13. Drift down to around 11 and stable.  Depression -Continue Zoloft 50 mg daily  Insomnia -Continue melatonin 3 mg qhs  Right lower leg pain Patient has concern for possible DVT. -LE venous duplex  DVT prophylaxis: Eliquis Code Status:   Code Status: DNR Family Communication: None at bedside Disposition Plan: Medically stable for discharge. Discharge to CIR when bed  available   Consultants:   Orthopedic surgery  PM&R  Procedures:   LEFT HIP PERCUTANEOUS PINNING (03/30/2021)  Antimicrobials:  None    Subjective: Some right lower leg pain. No other concerns.  Objective: Vitals:   03/31/21 1514 03/31/21 1517 03/31/21 2130 04/01/21 0355  BP: 116/75  (!) 144/71 (!) 147/63  Pulse: (!) 57 61 64 (!) 56  Resp: 17  18 18   Temp: 98.1 F (36.7 C)  98.2 F (36.8 C) 98.1 F (36.7 C)  TempSrc:      SpO2:  92% (!) 87% 93%  Weight:      Height:        Intake/Output Summary (Last 24 hours) at 04/01/2021 0741 Last data filed at 03/31/2021 1800 Gross per 24 hour  Intake 1080 ml  Output --  Net 1080 ml   Filed Weights   03/28/21 1722 03/30/21 1244  Weight: 45.8 kg 46.3 kg    Examination:  General exam: Appears calm and comfortable  Respiratory system: Clear to auscultation. Respiratory effort normal. Cardiovascular system: S1 & S2 heard, RRR. Gastrointestinal system: Abdomen is nondistended, soft and nontender. No organomegaly or masses felt. Normal bowel sounds heard. Central nervous system: Alert and oriented. No focal neurological deficits. Musculoskeletal: No edema. Right lower calf tenderness. Skin: No cyanosis. No rashes Psychiatry: Judgement and insight appear normal. Mood & affect appropriate.     Data Reviewed: I have personally reviewed following labs and imaging studies  CBC Lab Results  Component Value Date   WBC 7.1 04/01/2021   RBC 3.55 (L) 04/01/2021   HGB 10.7 (L) 04/01/2021   HCT 31.8 (L) 04/01/2021   MCV 89.6 04/01/2021   MCH 30.1 04/01/2021   PLT 250  04/01/2021   MCHC 33.6 04/01/2021   RDW 14.9 04/01/2021   LYMPHSABS 1.9 11/29/2018   MONOABS 0.4 11/29/2018   EOSABS 0.1 11/29/2018   BASOSABS 0.0 70/78/6754     Last metabolic panel Lab Results  Component Value Date   NA 130 (L) 04/01/2021   K 3.7 04/01/2021   CL 98 04/01/2021   CO2 23 04/01/2021   BUN 16 04/01/2021   CREATININE 0.62 04/01/2021    GLUCOSE 91 04/01/2021   GFRNONAA >60 04/01/2021   GFRAA >60 11/30/2018   CALCIUM 8.7 (L) 04/01/2021   PROT 7.8 11/29/2018   ALBUMIN 3.9 11/29/2018   LABGLOB 3.3 09/11/2018   AGRATIO 1.3 09/11/2018   BILITOT 0.9 11/29/2018   ALKPHOS 68 11/29/2018   AST 34 11/29/2018   ALT 19 11/29/2018   ANIONGAP 9 04/01/2021    CBG (last 3)  No results for input(s): GLUCAP in the last 72 hours.   GFR: Estimated Creatinine Clearance: 30.7 mL/min (by C-G formula based on SCr of 0.62 mg/dL).  Coagulation Profile: No results for input(s): INR, PROTIME in the last 168 hours.  Recent Results (from the past 240 hour(s))  SARS CORONAVIRUS 2 (TAT 6-24 HRS) Nasopharyngeal Nasopharyngeal Swab     Status: None   Collection Time: 03/28/21  5:37 PM   Specimen: Nasopharyngeal Swab  Result Value Ref Range Status   SARS Coronavirus 2 NEGATIVE NEGATIVE Final    Comment: (NOTE) SARS-CoV-2 target nucleic acids are NOT DETECTED.  The SARS-CoV-2 RNA is generally detectable in upper and lower respiratory specimens during the acute phase of infection. Negative results do not preclude SARS-CoV-2 infection, do not rule out co-infections with other pathogens, and should not be used as the sole basis for treatment or other patient management decisions. Negative results must be combined with clinical observations, patient history, and epidemiological information. The expected result is Negative.  Fact Sheet for Patients: SugarRoll.be  Fact Sheet for Healthcare Providers: https://www.woods-mathews.com/  This test is not yet approved or cleared by the [place] FDA and  has been authorized for detection and/or diagnosis of SARS-CoV-2 by FDA under an Emergency Use Authorization (EUA). This EUA will remain  in effect (meaning this test can be used) for the duration of the COVID-19 declaration under Se ction 564(b)(1) of the Act, 21 U.S.C. section 360bbb-3(b)(1), unless  the authorization is terminated or revoked sooner.  Performed at [facility], [place] [address], [place], [place] [address]   Surgical pcr screen     Status: None   Collection Time: 03/28/21  5:38 PM   Specimen: Nasal Mucosa; Nasal Swab  Result Value Ref Range Status   MRSA, PCR NEGATIVE NEGATIVE Final   Staphylococcus aureus NEGATIVE NEGATIVE Final    Comment: (NOTE) The Xpert SA Assay (FDA approved for NASAL specimens in patients 67 years of age and older), is one component of a comprehensive surveillance program. It is not intended to diagnose infection nor to guide or monitor treatment. Performed at [facility], [place] [address] [place], [place] [address]         Radiology Studies: DG C-Arm 1-60 Min  Result Date: 03/30/2021 CLINICAL DATA:  Cannulated hip pinning. EXAM: OPERATIVE LEFT HIP (WITH PELVIS IF PERFORMED) TECHNIQUE: Fluoroscopic spot image(s) were submitted for interpretation post-operatively. COMPARISON:  Preoperative radiograph yesterday. FINDINGS: Two fluoroscopic spot views of the left hip obtained in frontal and cross-table lateral projection demonstrate 3 cannulated screws traversing femoral neck fracture. Fluoroscopy time 21 seconds. IMPRESSION: Procedural fluoroscopy  for left femoral neck fracture pinning. Electronically Signed   By: Keith Rake M.D.   On: 03/30/2021 20:23   DG HIP PORT UNILAT WITH PELVIS 1V LEFT  Result Date: 03/30/2021 CLINICAL DATA:  Status post left hip surgery. EXAM: DG HIP (WITH OR WITHOUT PELVIS) 1V PORT LEFT COMPARISON:  March 29, 2021. FINDINGS: Status post surgical internal fixation of proximal left femoral neck fracture. Good alignment of fracture components is noted. IMPRESSION: Status post surgical internal fixation of proximal left femoral neck fracture. Electronically Signed   By: Marijo Conception M.D.   On: 03/30/2021 16:23   DG HIP OPERATIVE UNILAT WITH PELVIS LEFT  Result Date: 03/30/2021 CLINICAL DATA:  Cannulated  hip pinning. EXAM: OPERATIVE LEFT HIP (WITH PELVIS IF PERFORMED) TECHNIQUE: Fluoroscopic spot image(s) were submitted for interpretation post-operatively. COMPARISON:  Preoperative radiograph yesterday. FINDINGS: Two fluoroscopic spot views of the left hip obtained in frontal and cross-table lateral projection demonstrate 3 cannulated screws traversing femoral neck fracture. Fluoroscopy time 21 seconds. IMPRESSION: Procedural fluoroscopy for left femoral neck fracture pinning. Electronically Signed   By: Keith Rake M.D.   On: 03/30/2021 20:23        Scheduled Meds: . amiodarone  100 mg Oral QHS  . amLODipine  2.5 mg Oral Daily  . apixaban  2.5 mg Oral BID  . docusate sodium  100 mg Oral BID  . feeding supplement  237 mL Oral BID BM  . ferrous sulfate  325 mg Oral Q breakfast  . furosemide  20 mg Oral Daily  . levothyroxine  25 mcg Oral QAC breakfast  . melatonin  3 mg Oral QHS  . multivitamin with minerals  1 tablet Oral Daily  . senna  1 tablet Oral BID  . sertraline  50 mg Oral Daily   Continuous Infusions:   LOS: 4 days     Cordelia Poche, MD [facility] 04/01/2021, 7:41 AM  If 7PM-7AM, please contact night-coverage [email or web address]

## 2021-04-01 NOTE — PMR Pre-admission (Signed)
PMR Admission Coordinator Pre-Admission Assessment  Patient: Maria [name] is an 87 y.o., female MRN: [id number] DOB: 11-16-1923 Height: 5' 1.5" (156.2 cm) Weight: 46.3 kg  Insurance Information HMO:     PPO:      PCP:      IPA:      80/20: yes     OTHER:  PRIMARY: Medicare part A and B  Policy#: [id number]      Subscriber: Pt. Phone#: Verified online 10/25/92   Fax#:  Pre-Cert#:       Employer:  Benefits:  Phone #:      Name:  Eff. Date: Parts A ad B effective 02/23/1991, and 03/25/1992    Deduct: $1556      Out of Pocket Max:  None      Life Max: N/A  CIR: 100%      SNF: 100 days Outpatient: 80%     Co-Pay: 20% Home Health: 100%      Co-Pay: none DME: 80%     Co-Pay: 20% Providers: patient's choice   SECONDARY: AARP     Policy#: [id number]   Phone#:   Financial Counselor:       Phone#:   The "Data Collection Information Summary" for patients in Inpatient Rehabilitation Facilities with attached "Privacy Act [place] Records" was provided and verbally reviewed with: Patient  Emergency Contact Information Contact Information     Name Relation Home Work Mobile   [place] Daughter [phone]  [phone]   Benedict Needy [phone]  [phone]   Amparan, Jacqlyn Larsen Daughter   [phone]     Hermela Hardt (daughter)   [phone], [phone]  Current Medical History  Patient Admitting Diagnosis: L hip fracture  History of Present Illness: Patient is a 87 year old female with history of hypertension, A. fib on Eliquis, CAD, OSA on CPAP, hypothyroidism, CAD status post CABG, celiac disease, chronic dCHF, stage IIIa CKD presented with left hip fracture.  Patient was at [facility]).  They are renovating their beach house and went shopping for decoration.  She was getting things into the house and stepped onto a rug and it slipped out from under her.  Patient landed on her left hip.  She had a previous right hip repair in 2014. Patient went to  [facility], imaging showed left femur fracture.  X-rays of the left elbow did not show any fracture or dislocation.  CT C-spine did not show any fracture or dislocation. MD requested transfer to [facility].   Orthopedics consulted, underwent left hip percutaneous pinning on 03/30/2021.  PT/OT evaluations were completed with recommendations for inpatient rehab admission.  Patient's medical record from [facility] has been reviewed by the rehabilitation admission coordinator and physician.  Past Medical History  Past Medical History:  Diagnosis Date   Anemia    Atonic bladder 12/11/2013   Carotid artery occlusion    right carotid stenosis 40-60%, 4/08, neg for stenosis repeat study 11/11   Celiac disease    Chronic anticoagulation CHADS2VASC2 score 7 10/18/2014   Chronic diastolic CHF (congestive heart failure) (HCC)    takes Lasix daily   CKD (chronic kidney disease), stage III ([place])    Coronary artery disease    a.  08/2013 (LIMA-LAD, rSVG to LCX and Lt atrial clip),   Depression    DNR (do not resuscitate) 03/28/2021   DVT (deep venous thrombosis) ([place]) 09/2013   Gastric ulcer    6-62month ago   GERD (gastroesophageal reflux disease)    takes Omeprazole daily  GI bleeding    Hemorrhoids    Hot flashes    takes ZOloft 3 times a week   Hypertension    Hypothyroidism    takes Synthroid daily as result of AMiodarone   Insomnia    Macular degeneration    Mild aortic insufficiency    a. Echo 10/2013: mild AI.   Moderate mitral regurgitation by prior echocardiogram    a. Echo 10/2013.   Moderate obstructive sleep apnea    uses CPAP;sleep study about 4-52month ago   MVP (mitral valve prolapse)    Osteoporosis    PAF (paroxysmal atrial fibrillation) (HCC)    Peripheral edema    left   Presbycusis    Pulmonary hypertension (HCC)    RLS (restless legs syndrome)    Thyroid nodule    767m no change 11/11   Urethral stricture    Urinary anastomotic stricture     Urinary retention    Vitamin D deficiency     Family History   family history includes AAA (abdominal aortic aneurysm) in her brother; CAD in her father; CVA in her mother; Colon cancer in her father; Heart attack in her brother, father, and mother; Peripheral vascular disease in her brother.  Prior Rehab/Hospitalizations Has the patient had prior rehab or hospitalizations prior to admission? No  Has the patient had major surgery during 100 days prior to admission? Yes   Current Medications  Current Facility-Administered Medications:    acetaminophen (TYLENOL) tablet 325-650 mg, 325-650 mg, Oral, Q6H PRN, LaMarchia BondMD, 650 mg at 03/31/21 2132   alum & mag hydroxide-simeth (MAALOX/MYLANTA) 200-200-20 MG/5ML suspension 30 mL, 30 mL, Oral, Q4H PRN, LaMarchia BondMD   amiodarone (PACERONE) tablet 100 mg, 100 mg, Oral, QHS, Landau, JoVonna KotykMD, 100 mg at 04/01/21 2202   amLODipine (NORVASC) tablet 2.5 mg, 2.5 mg, Oral, Daily, LaMarchia BondMD, 2.5 mg at 04/02/21 [id number] apixaban (ELIQUIS) tablet 2.5 mg, 2.5 mg, Oral, BID, LaMarchia BondMD, 2.5 mg at 04/02/21 081443 bisacodyl (DULCOLAX) suppository 10 mg, 10 mg, Rectal, Daily PRN, LaMarchia BondMD   docusate sodium (COLACE) capsule 100 mg, 100 mg, Oral, BID, LaMarchia BondMD, 100 mg at 04/02/21 081540 feeding supplement (ENSURE ENLIVE / ENSURE PLUS) liquid 237 mL, 237 mL, Oral, BID BM, LaMarchia BondMD, 237 mL at 04/02/21 0806   ferrous sulfate tablet 325 mg, 325 mg, Oral, Q breakfast, Rai, Ripudeep K, MD, 325 mg at 04/02/21 0731   furosemide (LASIX) tablet 20 mg, 20 mg, Oral, Daily, Rai, Ripudeep K, MD, 20 mg at 04/02/21 0808   HYDROcodone-acetaminophen (NORCO/VICODIN) 5-325 MG per tablet 1-2 tablet, 1-2 tablet, Oral, Q4H PRN, LaMarchia BondMD, 1 tablet at 04/01/21 2203   levothyroxine (SYNTHROID) tablet 25 mcg, 25 mcg, Oral, QAC breakfast, LaMarchia BondMD, 25 mcg at 04/02/21 0731   magnesium citrate solution 1 Bottle, 1  Bottle, Oral, Once PRN, LaMarchia BondMD   melatonin tablet 3 mg, 3 mg, Oral, QHS, LaMarchia BondMD, 3 mg at 04/01/21 2203   menthol-cetylpyridinium (CEPACOL) lozenge 3 mg, 1 lozenge, Oral, PRN **OR** phenol (CHLORASEPTIC) mouth spray 1 spray, 1 spray, Mouth/Throat, PRN, LaMarchia BondMD   multivitamin with minerals tablet 1 tablet, 1 tablet, Oral, Daily, LaMarchia BondMD, 1 tablet at 04/02/21 0806   ondansetron (ZOFRAN) tablet 4 mg, 4 mg, Oral, Q6H PRN **OR** ondansetron (ZOFRAN) injection 4 mg, 4 mg, Intravenous, Q6H PRN, LaMarchia BondMD   polyethylene glycol (MIRALAX /  GLYCOLAX) packet 17 g, 17 g, Oral, Daily PRN, Rai, Ripudeep K, MD   senna (SENOKOT) tablet 8.6 mg, 1 tablet, Oral, BID, Marchia Bond, MD, 8.6 mg at 04/02/21 2725   sertraline (ZOLOFT) tablet 50 mg, 50 mg, Oral, Daily, Marchia Bond, MD, 50 mg at 04/02/21 3664  Patients Current Diet:  Diet Order             Diet gluten free Room service appropriate? Yes with Assist; Fluid consistency: Thin  Diet effective now                   Precautions / Restrictions Precautions Precautions: Fall Restrictions Weight Bearing Restrictions: Yes LLE Weight Bearing: Weight bearing as tolerated   Has the patient had 2 or more falls or a fall with injury in the past year? No  Prior Activity Level  Pt. Went out 3-4x a week  Prior Functional Level Self Care: Did the patient need help bathing, dressing, using the toilet or eating? Independent  Indoor Mobility: Did the patient need assistance with walking from room to room (with or without device)? Independent  Stairs: Did the patient need assistance with internal or external stairs (with or without device)? Independent  Functional Cognition: Did the patient need help planning regular tasks such as shopping or remembering to take medications? Needed some help  Home Assistive Devices / [place] Devices/Equipment: Cane (specify quad or straight) Home  Equipment: Cane - single point  Prior Device Use: Indicate devices/aids used by the patient prior to current illness, exacerbation or injury? None of the above  Current Functional Level Cognition  Overall Cognitive Status: Within Functional Limits for tasks assessed Orientation Level: Oriented X4 General Comments: need hearing aides    Extremity Assessment (includes Sensation/Coordination)  Upper Extremity Assessment: Overall WFL for tasks assessed  Lower Extremity Assessment: Defer to PT evaluation LLE Deficits / Details: minimal AROM of L hip due to pain, ginger with knee flexion due to pull on hip    ADLs  Overall ADL's : Needs assistance/impaired Eating/Feeding: Independent, Sitting Grooming: Wash/dry hands, Wash/dry face, Oral care, Applying deodorant, Brushing hair, Set up, Sitting Upper Body Bathing: Minimal assistance, Sitting Lower Body Bathing: Moderate assistance, Sit to/from stand Upper Body Dressing : Set up, Sitting Lower Body Dressing: Moderate assistance, Sit to/from stand Toilet Transfer: Minimal assistance, RW, Grab bars, Ambulation, Cueing for safety Toileting- Clothing Manipulation and Hygiene: Min guard, Sitting/lateral lean Functional mobility during ADLs: Minimal assistance, Rolling walker General ADL Comments: assistance levels for lower body ADLs due to pain in L hip as well as dizziness that occurs with bending    Mobility  Overal bed mobility: Needs Assistance Bed Mobility: Supine to Sit Supine to sit: Min assist, Mod assist General bed mobility comments: pt in chair upon arrival    Transfers  Overall transfer level: Needs assistance Equipment used: Rolling walker (2 wheeled) Transfers: Sit to/from Stand Sit to Stand: Min assist General transfer comment: light min A to boost up - min A for balance in standing; pt became dizzy after the change in position    Ambulation / Gait / Stairs / Wheelchair Mobility  Ambulation/Gait Ambulation/Gait  assistance: Herbalist (Feet): 75 Feet Assistive device: Rolling walker (2 wheeled) Gait Pattern/deviations: Step-to pattern, Step-through pattern General Gait Details: pt initially very antalgic with step gait pattern, s/p 20' pt able to amb with reciprocal short step length pattern with minA to keep RW moving forward instead of stopping after each step to  promoted fluidity, pt c/o "My shoulders are going to give out" pt given youth RW Gait velocity: slow Gait velocity interpretation: <1.31 ft/sec, indicative of household ambulator    Posture / Balance Dynamic Sitting Balance Sitting balance - Comments: pt able to participate in seated L LE exercises Balance Overall balance assessment: Needs assistance Sitting-balance support: Feet supported Sitting balance-Leahy Scale: Fair Sitting balance - Comments: pt able to participate in seated L LE exercises Standing balance support: Bilateral upper extremity supported, During functional activity Standing balance-Leahy Scale: Poor Standing balance comment: dependent on RW    Special needs/care consideration Skin Has post op incision post L hip IM nailing.   Previous Home Environment (from acute therapy documentation) Care Facility Name: [place]: No  Discharge Living Setting Plans for Discharge Living Setting: Other (Comment) Type of Home at Discharge: [place] Name at Discharge: [place] Discharge Home Layout: One level Discharge Home Access: Level entry Discharge Bathroom Shower/Tub: Walk-in shower Discharge Bathroom Toilet: Standard Discharge Bathroom Accessibility: Yes How Accessible: Accessible via walker, Accessible via wheelchair Does the patient have any problems obtaining your medications?: No  Social/Family/Support Systems Patient Roles: Other (Comment) Contact Information: [phone] Anticipated Caregiver: Jacqlyn Larsen (daughter) Anticipated Caregiver's  Contact Information: [phone] Ability/Limitations of Caregiver: none- daughters can stay for a few days, then will hire paid caregivers at [place] Availability: 24/7 Discharge Plan Discussed with Primary Caregiver: Yes Is Caregiver In Agreement with Plan?: Yes Does Caregiver/Family have Issues with Lodging/Transportation while Pt is in Rehab?: No  Goals Patient/Family Goal for Rehab: PT/OT Mod I Expected length of stay: 8-10 days Cultural Considerations: 24/7 Pt/Family Agrees to Admission and willing to participate: Yes Program Orientation Provided & Reviewed with Pt/Caregiver Including Roles  & Responsibilities: Yes  Decrease burden of Care through IP rehab admission: Specialzed equipment needs, Diet advancement, Decrease number of caregivers, Bowel and bladder program and Patient/family education  Possible need for SNF placement upon discharge: not anticipated  Patient Condition: I have reviewed medical records from [facility], spoken with CM, and patient and daughter. I met with patient at the bedside and discussed via phone for inpatient rehabilitation assessment.  Patient will benefit from ongoing PT and OT, can actively participate in 3 hours of therapy a day 5 days of the week, and can make measurable gains during the admission.  Patient will also benefit from the coordinated team approach during an Inpatient Acute Rehabilitation admission.  The patient will receive intensive therapy as well as Rehabilitation physician, nursing, social worker, and care management interventions.  Due to safety, skin/wound care, disease management, medication administration, pain management, and patient education the patient requires 24 hour a day rehabilitation nursing.  The patient is currently min to mod assist with mobility and basic ADLs.  Discharge setting and therapy post discharge at home with home health is anticipated.  Patient has agreed to participate in the Acute Inpatient  Rehabilitation Program and will admit today.  Preadmission Screen Completed By:  Retta Diones, 04/02/2021 11:16 AM ______________________________________________________________________   Discussed status with Dr. Dagoberto Ligas and Dr. Naaman Plummer on 04/02/21 at 0930 and received approval for admission today.  Admission Coordinator:  Retta Diones, RN, time 1117/Date 04/02/21   Assessment/Plan: Diagnosis: Does the need for close, 24 hr/day Medical supervision in concert with the patient's rehab needs make it unreasonable for this patient to be served in a less intensive setting? Yes Co-Morbidities requiring supervision/potential complications: Afib, on eliquis, CAD/CABG, dCHF, CKD3A, L hip/femur fx  s/p pinning Due to bladder management, bowel management, safety, skin/wound care, disease management, medication administration, pain management, and patient education, does the patient require 24 hr/day rehab nursing? Yes Does the patient require coordinated care of a physician, rehab nurse, PT, OT, and SLP to address physical and functional deficits in the context of the above medical diagnosis(es)? Yes Addressing deficits in the following areas: balance, endurance, locomotion, strength, transferring, bathing, dressing, feeding, grooming, and toileting Can the patient actively participate in an intensive therapy program of at least 3 hrs of therapy 5 days a week? Yes The potential for patient to make measurable gains while on inpatient rehab is good and fair Anticipated functional outcomes upon discharge from inpatient rehab: modified independent and supervision PT, modified independent and supervision OT, n/a SLP Estimated rehab length of stay to reach the above functional goals is: 8-10 days Anticipated discharge destination: Home 10. Overall Rehab/Functional Prognosis: good and fair   MD Signature:

## 2021-04-01 NOTE — Progress Notes (Signed)
Inpatient Rehab Admissions Coordinator:   I spoke with Pt. And daughters over the phone.They are interested in CR and daughters can provide 24/7 supportif needed at d/c. I will follow for potential CR admit pending bed availability.  Clemens Catholic, [place], [place] Admissions Coordinator  [phone] ([place]) [phone] (office)

## 2021-04-01 NOTE — Progress Notes (Signed)
Nutrition Follow-up  DOCUMENTATION CODES:   Not applicable  INTERVENTION:   -Continue Ensure Enlive po BID, each supplement provides 350 kcal and 20 grams of protein -Continue MVI with minerals daily  NUTRITION DIAGNOSIS:   Increased nutrient needs related to post-op healing as evidenced by estimated needs.  Ongoing  GOAL:   Patient will meet greater than or equal to 90% of their needs  Progressing  MONITOR:   PO intake,Supplement acceptance,Diet advancement,Labs,Weight trends,Skin,I & O's  REASON FOR ASSESSMENT:   Consult Assessment of nutrition requirement/status,Hip fracture protocol  ASSESSMENT:   Maria [name] is a 87 y.o. female with medical history significant of HTN; afib on Eliquis; CAD; OSA on CPAP; hypothyroidism; CAD s/p CABG; celiac disease; chronic diastolic CHF; and stage 3a CKD presenting with L hip fracture.  She was at [facility].  They are renovating their beach house and went shopping for things to decorate.  She was carrying things into the house and stepped into the house and stepped onto a rug and it slipped out from under her; they had bought it and it did not have a backing on it.  She landed on her L hip.  She previously had a R hip repair in about 2014.  She is "settling down" now, the ambulance ride was not very comfortable.  6/6- s/p  Left hip percutaneous pinning  Reviewed I/O's: +1.1 L x 24 hours and +1.8 L since admission  Spoke with pt, who reports she feels like she had a set back today due to nausea after working with PT. She reports feeling very well yesterday but has a poor appetite today. She was not able to eat any breakfast this morning, but has consumed 1.5 Ensure supplements.   Pt shares that she follows a gluten free diet secondary to celiac disease (diagnosed at age 57). She reports frustration about the limited options of gluten free diet on both the hospital menu and the facility where she stays. She shares that she  often cooks her own food and supplements with gluten free snacks from [employer]. Pt has a box of cookies, granola bars, and cereal in her room, which she will eat if she does not like what she is served. RD reviewed menu with her and obtained food preferences. Also personally ordered meals for lunch, dinner, and breakfast. Pt requesting specialty gluten free foods; RD spoke with food services director Marye Round) regarding this request.   Discussed importance of good meal and supplement intake to promote healing. Pt amenable to Ensure Enlive supplements.   Per pt, possible discharge to CIR.   Medications reviewed and include colace, ferrous sulfate, lasix, melatonin, and senna.   Labs reviewed: Na: 130.   NUTRITION - FOCUSED PHYSICAL EXAM:  Flowsheet Row Most Recent Value  Orbital Region Mild depletion  Upper Arm Region Moderate depletion  Thoracic and Lumbar Region No depletion  Buccal Region No depletion  Temple Region Moderate depletion  Clavicle Bone Region Moderate depletion  Clavicle and Acromion Bone Region Mild depletion  Scapular Bone Region Mild depletion  Dorsal Hand Mild depletion  Patellar Region Mild depletion  Anterior Thigh Region Mild depletion  Posterior Calf Region Mild depletion  Edema (RD Assessment) Mild  Hair Reviewed  Eyes Reviewed  Mouth Reviewed  Skin Reviewed  Nails Reviewed       Diet Order:   Diet Order            Diet regular Room service appropriate? Yes; Fluid consistency: Thin  Diet  effective now                 EDUCATION NEEDS:   No education needs have been identified at this time  Skin:  Skin Assessment: Reviewed RN Assessment  Last BM:  03/30/21  Height:   Ht Readings from Last 1 Encounters:  03/30/21 5' 1.5" (1.562 m)    Weight:   Wt Readings from Last 1 Encounters:  03/30/21 46.3 kg    Ideal Body Weight:  47.7 kg  BMI:  Body mass index is 18.96 kg/m.  Estimated Nutritional Needs:   Kcal:  1450-1650  Protein:   70-85 grams  Fluid:  > 1.4 L    Loistine Chance, RD, LDN, [place] Registered Dietitian II Certified Diabetes Care and Education Specialist Please refer to [facility] for RD and/or RD on-call/weekend/after hours pager

## 2021-04-01 NOTE — Progress Notes (Signed)
Occupational Therapy Evaluation Patient Details Name: Maria [name] MRN: [id number] DOB: 13-May-1924 Today's Date: 04/01/2021    History of Present Illness Maria [name] is a 87 y.o. female with L hip fracture who underwent an IM nail on 03/30/2021 PMH: HTN; afib on Eliquis; CAD; OSA on CPAP; hypothyroidism; CAD s/p CABG; celiac disease; chronic diastolic CHF; and stage 3a CKD   Clinical Impression   Maria [name] was evaluated s/p the above L hip fx and IM nail placement. PTA she was living in a ILF, completed all ADLs indep and received some assistance for cleaning and cooking, pt still drives. [place] when she slipped on a new rug and fell. Upon evaluation she is set up for upper body ADLs while sitting, and mod A for lower body ADLs. After position changes pt noted dizziness, and reported that she has a history of vertigo and has compensated for it for years. Pt would benefit from continued acute OT services to progress indep in ADLs and mobility. Pt has excellent rehab potential, recommend CIR at d/c.     Follow Up Recommendations  CIR    Equipment Recommendations  3 in 1 bedside commode;Other (comment) (Youth RW)       Precautions / Restrictions Precautions Precautions: Fall Restrictions Weight Bearing Restrictions: Yes LLE Weight Bearing: Weight bearing as tolerated      Mobility Bed Mobility Overal bed mobility: Needs Assistance Bed Mobility: Supine to Sit     Supine to sit: Min assist;Mod assist     General bed mobility comments: pt in chair upon arrival    Transfers Overall transfer level: Needs assistance Equipment used: Rolling walker (2 wheeled) Transfers: Sit to/from Stand Sit to Stand: Min assist         General transfer comment: light min A to boost up - min A for balance in standing; pt became dizzy after the change in position    Balance Overall balance assessment: Needs assistance Sitting-balance support: Feet  supported Sitting balance-Leahy Scale: Fair Sitting balance - Comments: pt able to participate in seated L LE exercises   Standing balance support: Bilateral upper extremity supported;During functional activity Standing balance-Leahy Scale: Poor Standing balance comment: dependent on RW              ADL either performed or assessed with clinical judgement   ADL Overall ADL's : Needs assistance/impaired Eating/Feeding: Independent;Sitting   Grooming: Wash/dry hands;Wash/dry face;Oral care;Applying deodorant;Brushing hair;Set up;Sitting   Upper Body Bathing: Minimal assistance;Sitting   Lower Body Bathing: Moderate assistance;Sit to/from stand   Upper Body Dressing : Set up;Sitting   Lower Body Dressing: Moderate assistance;Sit to/from stand   Toilet Transfer: Minimal assistance;RW;Grab bars;Ambulation;Cueing for safety   Toileting- Clothing Manipulation and Hygiene: Min guard;Sitting/lateral lean       Functional mobility during ADLs: Minimal assistance;Rolling walker General ADL Comments: assistance levels for lower body ADLs due to pain in L hip as well as dizziness that occurs with bending     Vision Baseline Vision/History: Wears glasses Wears Glasses: Reading only Vision Assessment?: No apparent visual deficits            Pertinent Vitals/Pain Pain Assessment: Faces Faces Pain Scale: Hurts even more Pain Location: L hip Pain Descriptors / Indicators: Discomfort Pain Intervention(s): Monitored during session;Limited activity within patient's tolerance     Hand Dominance Right   Extremity/Trunk Assessment Upper Extremity Assessment Upper Extremity Assessment: Overall WFL for tasks assessed   Lower Extremity Assessment Lower Extremity Assessment:  Defer to PT evaluation   Cervical / Trunk Assessment Cervical / Trunk Assessment: Kyphotic   Communication Communication Communication: HOH   Cognition Arousal/Alertness: Awake/alert Behavior During  Therapy: WFL for tasks assessed/performed Overall Cognitive Status: Within Functional Limits for tasks assessed         General Comments: need hearing aides   General Comments  pt with past history concerns of vertigo, pt dizzy with change in position throughout session; RN adn PT notified.    Exercises Exercises: General Lower Extremity General Exercises - Lower Extremity Ankle Circles/Pumps: AROM;Both;10 reps;Supine Quad Sets: AROM;Left;10 reps;Supine Gluteal Sets: AROM;Both;10 reps;Supine Long Arc Quad: AROM;Left;10 reps;Seated Heel Slides: AAROM;Left;10 reps;Sidelying (foot on towel)     Home Living Family/patient expects to be discharged to:: Assisted living           Home Equipment: [employer] - single point          Prior Functioning/Environment Level of Independence: Independent        Comments: Pt was indep in ADLs, and had assistance for some IADLs such as cooking and cleaning. pt still drives, manages all finances, pt was shopping and carrying bags when she slipped on a throw rug              OT Treatment/Interventions: Self-care/ADL training;Therapeutic exercise;Energy conservation;DME and/or AE instruction;Balance training;Patient/family education;Therapeutic activities    OT Goals(Current goals can be found in the care plan section) Acute Rehab OT Goals Patient Stated Goal: go to rehab so I can get back to my house OT Goal Formulation: With patient Time For Goal Achievement: 04/15/21 Potential to Achieve Goals: Good ADL Goals Pt Will Perform Grooming: with supervision;standing Pt Will Perform Lower Body Bathing: with min guard assist;sit to/from stand Pt Will Perform Lower Body Dressing: with min guard assist;sit to/from stand Pt Will Transfer to Toilet: with supervision;ambulating Pt Will Perform Toileting - Clothing Manipulation and hygiene: Independently;sitting/lateral leans Additional ADL Goal #1: Pt will independently recall at least 3 fall  prevention strategies to promote safe transition into the next venue  OT Frequency: Min 2X/week    AM-PAC OT "6 Clicks" Daily Activity     Outcome Measure Help from another person eating meals?: None Help from another person taking care of personal grooming?: A Little Help from another person toileting, which includes using toliet, bedpan, or urinal?: A Little Help from another person bathing (including washing, rinsing, drying)?: A Lot Help from another person to put on and taking off regular upper body clothing?: A Little Help from another person to put on and taking off regular lower body clothing?: A Lot 6 Click Score: 17   End of Session Equipment Utilized During Treatment: Gait belt Nurse Communication: Mobility status  Activity Tolerance: Patient tolerated treatment well Patient left: in chair;with call bell/phone within reach  OT Visit Diagnosis: Other abnormalities of gait and mobility (R26.89);Muscle weakness (generalized) (M62.81);History of falling (Z91.81);Pain Pain - Right/Left: Left Pain - part of body: Hip                Time: 6440-3474 OT Time Calculation (min): 24 min Charges:  OT General Charges $OT Visit: 1 Visit OT Evaluation $OT Eval Moderate Complexity: 1 Mod OT Treatments $Therapeutic Activity: 8-22 mins    Mikal Blasdell A Texie Tupou 04/01/2021, 10:11 AM

## 2021-04-01 NOTE — Progress Notes (Signed)
Physical Therapy Treatment Patient Details Name: Maria [name] MRN: [id number] DOB: 1924/03/29 Today's Date: 04/01/2021    History of Present Illness Maria [name] is a 87 y.o. female with L hip fracture who underwent an IM nail on 03/30/2021 PMH: HTN; afib on Eliquis; CAD; OSA on CPAP; hypothyroidism; CAD s/p CABG; celiac disease; chronic diastolic CHF; and stage 3a CKD    PT Comments    Pt progressing well. Pt with increased tolerance of L LE exercises and ambulation. Pt to benefit from youth RW due to height to improve optimal position for UEs. Pt remains appropriate for CIR as pt is very motivated and demonstrates excellent rehab potential. Acute PT to cont to follow.    Follow Up Recommendations  CIR     Equipment Recommendations  Rolling walker with 5" wheels;3in1 (PT) (YOUYH RW)    Recommendations for Other Services       Precautions / Restrictions Precautions Precautions: Fall Restrictions Weight Bearing Restrictions: Yes LLE Weight Bearing: Weight bearing as tolerated    Mobility  Bed Mobility Overal bed mobility: Needs Assistance Bed Mobility: Supine to Sit     Supine to sit: Min assist;Mod assist     General bed mobility comments: minA for L LE management, modA for trunk elevation, pt able to scoot self to EOB holding L LE in extension, encourage pt to bend L knee    Transfers Overall transfer level: Needs assistance Equipment used: Rolling walker (2 wheeled) Transfers: Sit to/from Stand Sit to Stand: Min assist;Mod assist;From elevated surface         General transfer comment: modA to power up from bed, minA to power up from chair, verbal cues for hand placement, increased time, focused on pushing up more on the R side  Ambulation/Gait Ambulation/Gait assistance: Min assist Gait Distance (Feet): 75 Feet Assistive device: Rolling walker (2 wheeled) Gait Pattern/deviations: Step-to pattern;Step-through pattern Gait velocity: slow Gait velocity  interpretation: <1.31 ft/sec, indicative of household ambulator General Gait Details: pt initially very antalgic with step gait pattern, s/p 20' pt able to amb with reciprocal short step length pattern with minA to keep RW moving forward instead of stopping after each step to promoted fluidity, pt c/o "My shoulders are going to give out" pt given youth RW   Stairs             Wheelchair Mobility    Modified Rankin (Stroke Patients Only)       Balance Overall balance assessment: Needs assistance Sitting-balance support: Feet supported;Bilateral upper extremity supported Sitting balance-Leahy Scale: Fair Sitting balance - Comments: pt able to participate in seated L LE exercises   Standing balance support: Bilateral upper extremity supported;During functional activity Standing balance-Leahy Scale: Poor Standing balance comment: dependent on RW                            Cognition Arousal/Alertness: Awake/alert Behavior During Therapy: WFL for tasks assessed/performed Overall Cognitive Status: Within Functional Limits for tasks assessed                                 General Comments: need hearing aides      Exercises General Exercises - Lower Extremity Ankle Circles/Pumps: AROM;Both;10 reps;Supine Quad Sets: AROM;Left;10 reps;Supine Gluteal Sets: AROM;Both;10 reps;Supine Long Arc Quad: AROM;Left;10 reps;Seated Heel Slides: AAROM;Left;10 reps;Sidelying (foot on towel)    General Comments General comments (skin integrity, edema,  etc.): minimal L LE swelling, dressing intact. Pt assisted to [facility], needed assist for hygine to clean bottom      Pertinent Vitals/Pain Pain Assessment: No/denies pain Faces Pain Scale: Hurts whole lot Pain Location: L hip Pain Descriptors / Indicators: Discomfort Pain Intervention(s): Other (comment) (pt declined pain medicine initially due to "I was thinking it was paralyzing my bladder, but now I think I need it"  RN notified)    Home Living                      Prior Function            PT Goals (current goals can now be found in the care plan section) Progress towards PT goals: Progressing toward goals    Frequency    Min 4X/week      PT Plan Current plan remains appropriate    Co-evaluation              AM-PAC PT "6 Clicks" Mobility   Outcome Measure  Help needed turning from your back to your side while in a flat bed without using bedrails?: A Little Help needed moving from lying on your back to sitting on the side of a flat bed without using bedrails?: A Little Help needed moving to and from a bed to a chair (including a wheelchair)?: A Little Help needed standing up from a chair using your arms (e.g., wheelchair or bedside chair)?: A Little Help needed to walk in hospital room?: A Little Help needed climbing 3-5 steps with a railing? : A Lot 6 Click Score: 17    End of Session Equipment Utilized During Treatment: Gait belt Activity Tolerance: Patient tolerated treatment well Patient left: with call bell/phone within reach;Other (comment) (on commode, pt with verbal understandig to pull assistance cord, not to get up on own, CNA alerted) Nurse Communication: Mobility status (pt on commode for BM) PT Visit Diagnosis: Unsteadiness on feet (R26.81);Muscle weakness (generalized) (M62.81);Difficulty in walking, not elsewhere classified (R26.2)     Time: 0881-1031 PT Time Calculation (min) (ACUTE ONLY): 32 min  Charges:  $Gait Training: 8-22 mins $Therapeutic Exercise: 8-22 mins                     Kittie Plater, PT, DPT Acute Rehabilitation Services Pager #: [phone] Office #: [phone]    Berline Lopes 04/01/2021, 8:22 AM

## 2021-04-02 ENCOUNTER — Encounter (HOSPITAL_COMMUNITY): Payer: Self-pay | Admitting: Physical Medicine & Rehabilitation

## 2021-04-02 ENCOUNTER — Inpatient Hospital Stay (HOSPITAL_COMMUNITY)
Admission: RE | Admit: 2021-04-02 | Discharge: 2021-04-15 | DRG: 560 | Disposition: A | Discharge status: Skilled Nursing Facility | Payer: Medicare Other | Source: Intra-hospital | Attending: Physical Medicine & Rehabilitation | Admitting: Physical Medicine & Rehabilitation

## 2021-04-02 ENCOUNTER — Other Ambulatory Visit: Payer: Self-pay

## 2021-04-02 ENCOUNTER — Inpatient Hospital Stay (HOSPITAL_COMMUNITY): Payer: Medicare Other

## 2021-04-02 DIAGNOSIS — M810 Age-related osteoporosis without current pathological fracture: Secondary | ICD-10-CM | POA: Diagnosis present

## 2021-04-02 DIAGNOSIS — I2510 Atherosclerotic heart disease of native coronary artery without angina pectoris: Secondary | ICD-10-CM | POA: Diagnosis present

## 2021-04-02 DIAGNOSIS — G2581 Restless legs syndrome: Secondary | ICD-10-CM | POA: Diagnosis present

## 2021-04-02 DIAGNOSIS — Z681 Body mass index (BMI) 19 or less, adult: Secondary | ICD-10-CM | POA: Diagnosis not present

## 2021-04-02 DIAGNOSIS — R2689 Other abnormalities of gait and mobility: Secondary | ICD-10-CM | POA: Diagnosis not present

## 2021-04-02 DIAGNOSIS — E440 Moderate protein-calorie malnutrition: Secondary | ICD-10-CM | POA: Diagnosis present

## 2021-04-02 DIAGNOSIS — Z7401 Bed confinement status: Secondary | ICD-10-CM | POA: Diagnosis not present

## 2021-04-02 DIAGNOSIS — I480 Paroxysmal atrial fibrillation: Secondary | ICD-10-CM | POA: Diagnosis present

## 2021-04-02 DIAGNOSIS — G629 Polyneuropathy, unspecified: Secondary | ICD-10-CM | POA: Diagnosis present

## 2021-04-02 DIAGNOSIS — Z951 Presence of aortocoronary bypass graft: Secondary | ICD-10-CM | POA: Diagnosis not present

## 2021-04-02 DIAGNOSIS — N312 Flaccid neuropathic bladder, not elsewhere classified: Secondary | ICD-10-CM | POA: Diagnosis present

## 2021-04-02 DIAGNOSIS — S7292XD Unspecified fracture of left femur, subsequent encounter for closed fracture with routine healing: Principal | ICD-10-CM

## 2021-04-02 DIAGNOSIS — W1830XD Fall on same level, unspecified, subsequent encounter: Secondary | ICD-10-CM

## 2021-04-02 DIAGNOSIS — Z86718 Personal history of other venous thrombosis and embolism: Secondary | ICD-10-CM

## 2021-04-02 DIAGNOSIS — Z7901 Long term (current) use of anticoagulants: Secondary | ICD-10-CM

## 2021-04-02 DIAGNOSIS — I272 Pulmonary hypertension, unspecified: Secondary | ICD-10-CM | POA: Diagnosis present

## 2021-04-02 DIAGNOSIS — K900 Celiac disease: Secondary | ICD-10-CM | POA: Diagnosis present

## 2021-04-02 DIAGNOSIS — F5104 Psychophysiologic insomnia: Secondary | ICD-10-CM | POA: Diagnosis present

## 2021-04-02 DIAGNOSIS — Z79890 Hormone replacement therapy (postmenopausal): Secondary | ICD-10-CM

## 2021-04-02 DIAGNOSIS — S72302D Unspecified fracture of shaft of left femur, subsequent encounter for closed fracture with routine healing: Secondary | ICD-10-CM | POA: Diagnosis not present

## 2021-04-02 DIAGNOSIS — R5381 Other malaise: Secondary | ICD-10-CM | POA: Diagnosis not present

## 2021-04-02 DIAGNOSIS — Z66 Do not resuscitate: Secondary | ICD-10-CM | POA: Diagnosis present

## 2021-04-02 DIAGNOSIS — F32A Depression, unspecified: Secondary | ICD-10-CM | POA: Diagnosis present

## 2021-04-02 DIAGNOSIS — S72002D Fracture of unspecified part of neck of left femur, subsequent encounter for closed fracture with routine healing: Secondary | ICD-10-CM | POA: Diagnosis not present

## 2021-04-02 DIAGNOSIS — E8809 Other disorders of plasma-protein metabolism, not elsewhere classified: Secondary | ICD-10-CM | POA: Diagnosis not present

## 2021-04-02 DIAGNOSIS — M6281 Muscle weakness (generalized): Secondary | ICD-10-CM | POA: Diagnosis not present

## 2021-04-02 DIAGNOSIS — D62 Acute posthemorrhagic anemia: Secondary | ICD-10-CM | POA: Diagnosis present

## 2021-04-02 DIAGNOSIS — R278 Other lack of coordination: Secondary | ICD-10-CM | POA: Diagnosis not present

## 2021-04-02 DIAGNOSIS — E039 Hypothyroidism, unspecified: Secondary | ICD-10-CM | POA: Diagnosis present

## 2021-04-02 DIAGNOSIS — Z20822 Contact with and (suspected) exposure to covid-19: Secondary | ICD-10-CM | POA: Diagnosis present

## 2021-04-02 DIAGNOSIS — R531 Weakness: Secondary | ICD-10-CM | POA: Diagnosis not present

## 2021-04-02 DIAGNOSIS — N1831 Chronic kidney disease, stage 3a: Secondary | ICD-10-CM | POA: Diagnosis not present

## 2021-04-02 DIAGNOSIS — N183 Chronic kidney disease, stage 3 unspecified: Secondary | ICD-10-CM | POA: Diagnosis present

## 2021-04-02 DIAGNOSIS — I5032 Chronic diastolic (congestive) heart failure: Secondary | ICD-10-CM | POA: Diagnosis present

## 2021-04-02 DIAGNOSIS — E871 Hypo-osmolality and hyponatremia: Secondary | ICD-10-CM | POA: Diagnosis not present

## 2021-04-02 DIAGNOSIS — R609 Edema, unspecified: Secondary | ICD-10-CM

## 2021-04-02 DIAGNOSIS — I130 Hypertensive heart and chronic kidney disease with heart failure and stage 1 through stage 4 chronic kidney disease, or unspecified chronic kidney disease: Secondary | ICD-10-CM | POA: Diagnosis present

## 2021-04-02 DIAGNOSIS — E559 Vitamin D deficiency, unspecified: Secondary | ICD-10-CM | POA: Diagnosis present

## 2021-04-02 DIAGNOSIS — Z79899 Other long term (current) drug therapy: Secondary | ICD-10-CM | POA: Diagnosis not present

## 2021-04-02 DIAGNOSIS — K219 Gastro-esophageal reflux disease without esophagitis: Secondary | ICD-10-CM | POA: Diagnosis present

## 2021-04-02 DIAGNOSIS — R1312 Dysphagia, oropharyngeal phase: Secondary | ICD-10-CM | POA: Diagnosis not present

## 2021-04-02 DIAGNOSIS — S7292XA Unspecified fracture of left femur, initial encounter for closed fracture: Secondary | ICD-10-CM | POA: Diagnosis present

## 2021-04-02 DIAGNOSIS — J449 Chronic obstructive pulmonary disease, unspecified: Secondary | ICD-10-CM | POA: Diagnosis present

## 2021-04-02 MED ORDER — PROCHLORPERAZINE MALEATE 5 MG PO TABS: 5.0000 mg | ORAL_TABLET | Freq: Four times a day (QID) | ORAL | Status: DC | PRN

## 2021-04-02 MED ORDER — ADULT MULTIVITAMIN W/MINERALS CH
1.0000 | ORAL_TABLET | Freq: Every day | ORAL | Status: DC
  Administered 2021-04-03 – 2021-04-15 (×13): 1 via ORAL
  Filled 2021-04-02 (×13): qty 1

## 2021-04-02 MED ORDER — HYDROCODONE-ACETAMINOPHEN 5-325 MG PO TABS: 1.0000 | ORAL_TABLET | ORAL | Status: DC | PRN

## 2021-04-02 MED ORDER — ENSURE ENLIVE PO LIQD: 237.0000 mL | Freq: Two times a day (BID) | ORAL | Status: DC

## 2021-04-02 MED ORDER — ACETAMINOPHEN 325 MG PO TABS
325.0000 mg | ORAL_TABLET | ORAL | Status: DC | PRN
  Administered 2021-04-05 – 2021-04-08 (×3): 650 mg via ORAL
  Filled 2021-04-02 (×4): qty 2

## 2021-04-02 MED ORDER — FUROSEMIDE 20 MG PO TABS: 20.0000 mg | ORAL_TABLET | Freq: Every morning | ORAL | Status: DC

## 2021-04-02 MED ORDER — ADULT MULTIVITAMIN W/MINERALS CH: 1.0000 | ORAL_TABLET | Freq: Every day | ORAL | Status: DC

## 2021-04-02 MED ORDER — PROCHLORPERAZINE 25 MG RE SUPP: 12.5000 mg | Freq: Four times a day (QID) | RECTAL | Status: DC | PRN

## 2021-04-02 MED ORDER — FLEET ENEMA 7-19 GM/118ML RE ENEM: 1.0000 | ENEMA | Freq: Once | RECTAL | Status: DC | PRN

## 2021-04-02 MED ORDER — APIXABAN 2.5 MG PO TABS
2.5000 mg | ORAL_TABLET | Freq: Two times a day (BID) | ORAL | Status: DC
  Administered 2021-04-02 – 2021-04-15 (×26): 2.5 mg via ORAL
  Filled 2021-04-02 (×26): qty 1

## 2021-04-02 MED ORDER — SENNOSIDES-DOCUSATE SODIUM 8.6-50 MG PO TABS
1.0000 | ORAL_TABLET | Freq: Two times a day (BID) | ORAL | Status: DC
  Administered 2021-04-02 – 2021-04-04 (×4): 1 via ORAL
  Filled 2021-04-02 (×4): qty 1

## 2021-04-02 MED ORDER — POLYETHYLENE GLYCOL 3350 17 G PO PACK: 17.0000 g | PACK | Freq: Every day | ORAL | Status: DC | PRN

## 2021-04-02 MED ORDER — AMIODARONE HCL 200 MG PO TABS
100.0000 mg | ORAL_TABLET | Freq: Every day | ORAL | Status: DC
  Administered 2021-04-02 – 2021-04-14 (×13): 100 mg via ORAL
  Filled 2021-04-02 (×13): qty 1

## 2021-04-02 MED ORDER — FUROSEMIDE 20 MG PO TABS
20.0000 mg | ORAL_TABLET | Freq: Every day | ORAL | Status: DC
  Administered 2021-04-03 – 2021-04-15 (×13): 20 mg via ORAL
  Filled 2021-04-02 (×13): qty 1

## 2021-04-02 MED ORDER — SERTRALINE HCL 50 MG PO TABS
50.0000 mg | ORAL_TABLET | Freq: Every day | ORAL | Status: DC
  Administered 2021-04-03 – 2021-04-15 (×13): 50 mg via ORAL
  Filled 2021-04-02 (×13): qty 1

## 2021-04-02 MED ORDER — GUAIFENESIN-DM 100-10 MG/5ML PO SYRP: 5.0000 mL | ORAL_SOLUTION | Freq: Four times a day (QID) | ORAL | Status: DC | PRN

## 2021-04-02 MED ORDER — PROCHLORPERAZINE EDISYLATE 10 MG/2ML IJ SOLN: 5.0000 mg | Freq: Four times a day (QID) | INTRAMUSCULAR | Status: DC | PRN

## 2021-04-02 MED ORDER — DIPHENHYDRAMINE HCL 12.5 MG/5ML PO ELIX: 12.5000 mg | ORAL_SOLUTION | Freq: Four times a day (QID) | ORAL | Status: DC | PRN

## 2021-04-02 MED ORDER — LEVOTHYROXINE SODIUM 25 MCG PO TABS
25.0000 ug | ORAL_TABLET | Freq: Every day | ORAL | Status: DC
  Administered 2021-04-03 – 2021-04-15 (×13): 25 ug via ORAL
  Filled 2021-04-02 (×13): qty 1

## 2021-04-02 MED ORDER — MENTHOL 3 MG MT LOZG: 1.0000 | LOZENGE | OROMUCOSAL | Status: DC | PRN

## 2021-04-02 MED ORDER — BISACODYL 10 MG RE SUPP: 10.0000 mg | Freq: Every day | RECTAL | Status: DC | PRN

## 2021-04-02 MED ORDER — FAMOTIDINE 20 MG PO TABS
10.0000 mg | ORAL_TABLET | Freq: Two times a day (BID) | ORAL | Status: DC
  Administered 2021-04-02 – 2021-04-15 (×26): 10 mg via ORAL
  Filled 2021-04-02 (×26): qty 1

## 2021-04-02 MED ORDER — MELATONIN 5 MG PO TABS
5.0000 mg | ORAL_TABLET | Freq: Every day | ORAL | Status: DC
  Administered 2021-04-02 – 2021-04-05 (×4): 5 mg via ORAL
  Administered 2021-04-06 – 2021-04-12 (×7): 10 mg via ORAL
  Administered 2021-04-13: 5 mg via ORAL
  Administered 2021-04-14: 10 mg via ORAL
  Filled 2021-04-02 (×4): qty 2
  Filled 2021-04-02 (×2): qty 1
  Filled 2021-04-02: qty 2
  Filled 2021-04-02 (×2): qty 1
  Filled 2021-04-02 (×3): qty 2

## 2021-04-02 MED ORDER — TRAMADOL HCL 50 MG PO TABS
50.0000 mg | ORAL_TABLET | Freq: Four times a day (QID) | ORAL | Status: DC | PRN
  Administered 2021-04-02 – 2021-04-08 (×9): 50 mg via ORAL
  Filled 2021-04-02 (×11): qty 1

## 2021-04-02 MED ORDER — FERROUS SULFATE 325 (65 FE) MG PO TABS: 325.0000 mg | ORAL_TABLET | Freq: Every day | ORAL | 3 refills | Status: DC

## 2021-04-02 MED ORDER — POLYETHYLENE GLYCOL 3350 17 G PO PACK
17.0000 g | PACK | Freq: Every day | ORAL | Status: DC | PRN
  Administered 2021-04-11 – 2021-04-12 (×2): 17 g via ORAL
  Filled 2021-04-02 (×3): qty 1

## 2021-04-02 MED ORDER — AMLODIPINE BESYLATE 2.5 MG PO TABS
2.5000 mg | ORAL_TABLET | Freq: Every day | ORAL | Status: DC
  Administered 2021-04-03 – 2021-04-15 (×13): 2.5 mg via ORAL
  Filled 2021-04-02 (×13): qty 1

## 2021-04-02 MED ORDER — FERROUS SULFATE 325 (65 FE) MG PO TABS
325.0000 mg | ORAL_TABLET | Freq: Every day | ORAL | Status: DC
  Administered 2021-04-03 – 2021-04-15 (×13): 325 mg via ORAL
  Filled 2021-04-02 (×13): qty 1

## 2021-04-02 MED ORDER — PHENOL 1.4 % MT LIQD
1.0000 | OROMUCOSAL | Status: DC | PRN
  Filled 2021-04-02: qty 177

## 2021-04-02 MED ORDER — ALUM & MAG HYDROXIDE-SIMETH 200-200-20 MG/5ML PO SUSP: 30.0000 mL | ORAL | Status: DC | PRN

## 2021-04-02 NOTE — Progress Notes (Addendum)
Patient ID: Maria [name], female   DOB: 12/28/1923, 87 y.o.   MRN: [id number] Met with the patient and daughter to review role of the nurse CM, review therapy schedule and plan of care. Patient on Eliquis for A-fib. Hx of right hip fx, HTN, HLD, hyponatremia and CHF. Continue to follow along with the patient to discharge to address educational needs and collaborate with the SW to facilitate preparation for discharge. Margarito Liner, RN

## 2021-04-02 NOTE — Progress Notes (Addendum)
IP rehab admissions - I met with patient this am.  She would like to come to inpatient rehab.  I do have a rehab bed available today if okay with attending MD.  I will await notification from MD.  Call for questions.  [phone]  I spoke with Dr. Lonny Prude and have clearance for admit to CIR today.  [phone]

## 2021-04-02 NOTE — H&P (Signed)
Physical Medicine and Rehabilitation Admission H&P    CC: Functional deficits due to femur fracture s/p repair   HPI: Maria [name] is a 87 year old female with history of CAD, CKD III, Celiac disease, COPD, atonic bladder, PAF- on Eliquis who sustained a fall with nondisplaced left basicervical femur fracture while at [facility] and was transferred back to [facility] on 03/28/21 for management. She was transitioned to IV heparin and underwent left hip pinning by Dr. Mardelle Matte on 06/06. Post op WBAT and Eliquis resumed the next day.  Hospital course significant for ABLA and stable hyponatremia. Therapy ongoing and patient continues to be limited by pain and weakness affecting mobility and ADLs. CIR recommended due to functional decline.   Pt reports having multiple Bms- doing well; voids "OK"- but has atonic bladder. Norco was making her nauseated, however she was taking on empty stomach.  If sits still, no pain, but hurts A LOT per pt, when moves at all.    Review of Systems  Constitutional:  Negative for chills and fever.  HENT:  Negative for hearing loss and tinnitus.   Eyes:  Negative for blurred vision and double vision.  Respiratory:  Positive for shortness of breath (with activity). Negative for cough.   Cardiovascular:  Negative for chest pain and palpitations.  Gastrointestinal:  Positive for diarrhea (chronic--worse in hospital) and nausea. Negative for abdominal pain.  Genitourinary:  Positive for dysuria.  Musculoskeletal:  Positive for back pain (chronic), joint pain (right hip) and myalgias.  Skin:  Negative for rash.  Neurological:  Positive for dizziness and weakness. Negative for headaches.  Psychiatric/Behavioral:  The patient is nervous/anxious and has insomnia.   All other systems reviewed and are negative.   Past Medical History:  Diagnosis Date   Anemia    Atonic bladder 12/11/2013   Carotid artery occlusion    right carotid stenosis 40-60%, 4/08, neg for  stenosis repeat study 11/11   Celiac disease    Chronic anticoagulation CHADS2VASC2 score 7 10/18/2014   Chronic diastolic CHF (congestive heart failure) (HCC)    takes Lasix daily   CKD (chronic kidney disease), stage III ([place])    Coronary artery disease    a.  08/2013 (LIMA-LAD, rSVG to LCX and Lt atrial clip),   Depression    DNR (do not resuscitate) 03/28/2021   DVT (deep venous thrombosis) ([place]) 09/2013   Gastric ulcer    6-71month ago   GERD (gastroesophageal reflux disease)    takes Omeprazole daily   GI bleeding    Hemorrhoids    Hot flashes    takes ZOloft 3 times a week   Hypertension    Hypothyroidism    takes Synthroid daily as result of AMiodarone   Insomnia    Macular degeneration    Mild aortic insufficiency    a. Echo 10/2013: mild AI.   Moderate mitral regurgitation by prior echocardiogram    a. Echo 10/2013.   Moderate obstructive sleep apnea    uses CPAP;sleep study about 4-575monthago   MVP (mitral valve prolapse)    Osteoporosis    PAF (paroxysmal atrial fibrillation) (HCC)    Peripheral edema    left   Presbycusis    Pulmonary hypertension (HCC)    RLS (restless legs syndrome)    Thyroid nodule    90m73mno change 11/11   Urethral stricture    Urinary anastomotic stricture    Urinary retention    Vitamin D deficiency  Past Surgical History:  Procedure Laterality Date   APPENDECTOMY     bilateral cataract surgery     CARDIAC CATHETERIZATION  08-06-13   CORONARY ARTERY BYPASS GRAFT N/A 08/28/2013   Procedure: CORONARY ARTERY BYPASS GRAFTING (CABG) x2 using right greater saphenous vein and left internal mammary artery. ;  Surgeon: Grace Isaac, MD;  Location: [place];  Service: Open Heart Surgery;  Laterality: N/A;   HIP ARTHROPLASTY Right 12/09/2013   Procedure: ARTHROPLASTY BIPOLAR HIP CEMENTED;  Surgeon: Kerin Salen, MD;  Location: [place];  Service: Orthopedics;  Laterality: Right;   HIP PINNING,CANNULATED Left 03/30/2021   Procedure:  CANNULATED HIP PINNING;  Surgeon: Marchia Bond, MD;  Location: [place];  Service: Orthopedics;  Laterality: Left;   INTRAOPERATIVE TRANSESOPHAGEAL ECHOCARDIOGRAM N/A 08/28/2013   Procedure: INTRAOPERATIVE TRANSESOPHAGEAL ECHOCARDIOGRAM;  Surgeon: Grace Isaac, MD;  Location: [place];  Service: Open Heart Surgery;  Laterality: N/A;   LEFT AND RIGHT HEART CATHETERIZATION WITH CORONARY ANGIOGRAM N/A 08/16/2013   Procedure: LEFT AND RIGHT HEART CATHETERIZATION WITH CORONARY ANGIOGRAM;  Surgeon: [name], MD;  Location: [facility] CATH LAB;  Service: Cardiovascular;  Laterality: N/A;   MANDIBLE SURGERY     TCS     TONSILLECTOMY     trigger thumb     TUBAL LIGATION      Family History  Problem Relation Age of Onset   Heart attack Mother    CVA Mother    CAD Father    Colon cancer Father    Heart attack Father    Heart attack Brother    Peripheral vascular disease Brother    AAA (abdominal aortic aneurysm) Brother     Social History:  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 She lives at [place]/has a cottage. She reports that she has never smoked. She has never used smokeless tobacco. She reports that she does not drink alcohol and does not use drugs.    Allergies  Allergen Reactions   Fentanyl Nausea And Vomiting    Severe n/v   Amoxicillin Nausea Only and Other (See Comments)    dizziness   Atorvastatin Other (See Comments)    Unknown reaction - reported by Sadie Haber   Bupropion Other (See Comments)    nightmares   Codeine Nausea And Vomiting   Fosamax [Alendronate Sodium] Other (See Comments)    Jaw pain   Gabapentin Other (See Comments)    dizziness   Gluten Meal Other (See Comments)    Celiac Disease    Hydrochlorothiazide Other (See Comments)    Hyponatremia, GI upset    Tetanus Toxoid, Adsorbed Swelling    Severe swelling at site of injection (pt tolerates 1/2 injection then 1/2 week later)    Medications Prior to Admission  Medication Sig Dispense Refill   acetaminophen  (TYLENOL) 500 MG tablet Take 1,000 mg by mouth every 6 (six) hours as needed (pain).     amiodarone (PACERONE) 200 MG tablet Take 0.5 tablets (100 mg total) by mouth daily. (Patient taking differently: Take 100 mg by mouth at bedtime.) 15 tablet 11   amLODipine (NORVASC) 2.5 MG tablet Take 1 tablet (2.5 mg total) by mouth daily for 30 days. 30 tablet 0   apixaban (ELIQUIS) 2.5 MG TABS tablet Take 1 tablet (2.5 mg total) by mouth 2 (two) times daily for 30 days. 60 tablet 0   furosemide (LASIX) 20 MG tablet Take 1 tablet (20 mg total) by mouth daily as needed for fluid or edema. (Patient  taking differently: Take 20 mg by mouth every morning.) 30 tablet    levothyroxine (SYNTHROID) 25 MCG tablet Take 25 mcg by mouth daily before breakfast.     magnesium hydroxide (MILK OF MAGNESIA) 400 MG/5ML suspension Take 15 mLs by mouth at bedtime as needed for mild constipation.     melatonin 5 MG TABS Take 5 mg by mouth at bedtime.     Menthol, Topical Analgesic, (ICY HOT EX) Apply 1 application topically daily as needed (pain).     Polyethyl Glycol-Propyl Glycol (SYSTANE) 0.4-0.3 % SOLN Place 1 drop into both eyes at bedtime.     sertraline (ZOLOFT) 50 MG tablet Take 50 mg by mouth every morning.      Drug Regimen Review  Drug regimen was reviewed and remains appropriate with no significant issues identified  Home: Home Living Family/patient expects to be discharged to:: Assisted living Home Equipment: Cane - single point   Functional History: Prior Function Level of Independence: Independent Comments: Pt was indep in ADLs, and had assistance for some IADLs such as cooking and cleaning. pt still drives, manages all finances, pt was shopping and carrying bags when she slipped on a throw rug  Functional Status:  Mobility: Bed Mobility Overal bed mobility: Needs Assistance Bed Mobility: Supine to Sit Supine to sit: Min assist, Mod assist General bed mobility comments: pt in chair upon  arrival Transfers Overall transfer level: Needs assistance Equipment used: Rolling walker (2 wheeled) Transfers: Sit to/from Stand Sit to Stand: Min assist General transfer comment: light min A to boost up - min A for balance in standing; pt became dizzy after the change in position Ambulation/Gait Ambulation/Gait assistance: Min assist Gait Distance (Feet): 75 Feet Assistive device: Rolling walker (2 wheeled) Gait Pattern/deviations: Step-to pattern, Step-through pattern General Gait Details: pt initially very antalgic with step gait pattern, s/p 20' pt able to amb with reciprocal short step length pattern with minA to keep RW moving forward instead of stopping after each step to promoted fluidity, pt c/o "My shoulders are going to give out" pt given youth RW Gait velocity: slow Gait velocity interpretation: <1.31 ft/sec, indicative of household ambulator    ADL: ADL Overall ADL's : Needs assistance/impaired Eating/Feeding: Independent, Sitting Grooming: Wash/dry hands, Wash/dry face, Oral care, Applying deodorant, Brushing hair, Set up, Sitting Upper Body Bathing: Minimal assistance, Sitting Lower Body Bathing: Moderate assistance, Sit to/from stand Upper Body Dressing : Set up, Sitting Lower Body Dressing: Moderate assistance, Sit to/from stand Toilet Transfer: Minimal assistance, RW, Grab bars, Ambulation, Cueing for safety Toileting- Clothing Manipulation and Hygiene: Min guard, Sitting/lateral lean Functional mobility during ADLs: Minimal assistance, Rolling walker General ADL Comments: assistance levels for lower body ADLs due to pain in L hip as well as dizziness that occurs with bending  Cognition: Cognition Overall Cognitive Status: Within Functional Limits for tasks assessed Orientation Level: Oriented X4 Cognition Arousal/Alertness: Awake/alert Behavior During Therapy: WFL for tasks assessed/performed Overall Cognitive Status: Within Functional Limits for tasks  assessed General Comments: need hearing aides   Blood pressure (!) 153/67, pulse 65, temperature 97.8 F (36.6 C), temperature source Oral, resp. rate 16, height 5' 1.5" (1.562 m), weight 46.3 kg, SpO2 93 %. Physical Exam Vitals and nursing note reviewed.  Constitutional:      Comments: Thin female. Appears younger than state age.  Slightly frail, but not as much as expected; sitting up in bedside chair, NAD  HENT:     Head: Normocephalic and atraumatic.     Right Ear:  External ear normal.     Left Ear: External ear normal.     Nose: Nose normal. No congestion.     Mouth/Throat:     Mouth: Mucous membranes are dry.     Pharynx: Oropharynx is clear. No oropharyngeal exudate.  Eyes:     General:        Right eye: No discharge.        Left eye: No discharge.     Extraocular Movements: Extraocular movements intact.  Cardiovascular:     Rate and Rhythm: Normal rate. Rhythm irregular.     Heart sounds: Normal heart sounds. No murmur heard.   No gallop.  Pulmonary:     Breath sounds: Rhonchi present.     Comments: A little coarse, but otherwise, good air movement noted- no W/R Abdominal:     Comments: Soft, NT, ND, (+)BS     Musculoskeletal:        General: Tenderness (Bilateral shins with dysesthesias) present.     Cervical back: Normal range of motion and neck supple.     Comments: Ue's 5/5 in all muscles tested- B/L RLE- HF, 4+/5, otherwise 5/5 LLE- HF 2-/5 due to pain; KE/KF NT due to pain; DF and PF at least 4/5- due to pain Dry dressing on left hip- very TTP ver L lateral hip  Skin:    General: Skin is warm and dry.     Comments: R forearm IV- looks OK. Has L hip incision- steristrips under dressing, but cannot get look due to pain;  No other skin breakdown  Neurological:     General: No focal deficit present.     Mental Status: She is alert and oriented to person, place, and time.     Comments: Sensation decreased due to neuropathy from mid calves down B/L      Results for orders placed or performed during the hospital encounter of 03/28/21 (from the past 48 hour(s))  VITAMIN D 25 Hydroxy (Vit-D Deficiency, Fractures)     Status: Abnormal   Collection Time: 04/01/21  3:08 AM  Result Value Ref Range   Vit D, 25-Hydroxy 18.92 (L) 30 - 100 ng/mL    Comment: (NOTE) Vitamin D deficiency has been defined by the [place] practice guideline as a level of serum 25-OH  vitamin D less than 20 ng/mL (1,2). The Endocrine Society went on to  further define vitamin D insufficiency as a level between 21 and 29  ng/mL (2).  1. IOM (Institute of Medicine). 2010. Dietary reference intakes for  calcium and D. [place]: The [employer]. 2. Holick MF, Binkley Donahue, Bischoff-Ferrari HA, et al. Evaluation,  treatment, and prevention of vitamin D deficiency: an Endocrine  Society clinical practice guideline, JCEM. 2011 Jul; 96(7): 1911-30.  Performed at [facility], [place] [address], [place], [place] [address]   CBC     Status: Abnormal   Collection Time: 04/01/21  3:08 AM  Result Value Ref Range   WBC 7.1 4.0 - 10.5 K/uL   RBC 3.55 (L) 3.87 - 5.11 MIL/uL   Hemoglobin 10.7 (L) 12.0 - 15.0 g/dL   HCT 31.8 (L) 36.0 - 46.0 %   MCV 89.6 80.0 - 100.0 fL   MCH 30.1 26.0 - 34.0 pg   MCHC 33.6 30.0 - 36.0 g/dL   RDW 14.9 11.5 - 15.5 %   Platelets 250 150 - 400 K/uL   nRBC 0.0 0.0 - 0.2 %  Comment: Performed at [facility], [place] [address] [place], [place] [address]  Basic metabolic panel     Status: Abnormal   Collection Time: 04/01/21  3:08 AM  Result Value Ref Range   Sodium 130 (L) 135 - 145 mmol/L   Potassium 3.7 3.5 - 5.1 mmol/L   Chloride 98 98 - 111 mmol/L   CO2 23 22 - 32 mmol/L   Glucose, Bld 91 70 - 99 mg/dL    Comment: Glucose reference range applies only to samples taken after fasting for at least 8 hours.   BUN 16 8 - 23 mg/dL   Creatinine, Ser 0.62 0.44 - 1.00 mg/dL    Calcium 8.7 (L) 8.9 - 10.3 mg/dL   GFR, Estimated >60 >60 mL/min    Comment: (NOTE) Calculated using the CKD-EPI Creatinine Equation (2021)    Anion gap 9 5 - 15    Comment: Performed at [facility] [address], [place], [place] [address]   No results found.     Medical Problem List and Plan: 1.  Impaired gait and function secondary L femur fx s/p percutaneous pinning- WBAT  -patient may  shower  -ELOS/Goals: 2-3 weeks- supervision to mod I 2.  Antithrombotics: -DVT/anticoagulation:  Continue Eliquis  -antiplatelet therapy:  N/A 3. Pain Management: Continue hydrocodone prn  -- 4. Mood: LCSW to follow for evaluation and support.   -antipsychotic agents: N/A 5. Neuropsych: This patient is capable of making decisions on hee own behalf. 6. Skin/Wound Care: Routine pressure relief measures.   --Monitor incision for healing.  7. Fluids/Electrolytes/Nutrition: Monitor I/O. Check lytes in am.   -- 8. PAF: Monitor HR TID--continue Pacerone and Eliquis 9. CAD s/p CABG/chronic diastolic CHF: Will check weights daily and monitor for signs of overload. Continue Lasix.   --heart healthy diet.   10 HTN: Will monitor BP tid--continue Lasix and Norvasc.  11. H/o COPD/DOE:  Monitor for symptoms with increase in activity.  12. RLS/Chronic insomnia: On melatonin--takes 5-10 mg prn at home.   13. Idiopathic peripheral polyneuropathy: Reports that legs are numb/does not have sensation to urinate but voiding without difficulty. --has seen Dr. [name] in the past.   14. Depression: On Zoloft for mood stabilization since pandemic.      Bary Leriche, PA-C 04/02/2021   I have personally performed a face to face diagnostic evaluation of this patient and formulated the key components of the plan.  Additionally, I have personally reviewed laboratory data, imaging studies, as well as relevant notes and concur with the physician assistant's documentation above.

## 2021-04-02 NOTE — Evaluation (Signed)
Physical Therapy Evaluation Patient Details Name: Maria [name] MRN: [id number] DOB: [birth year] Today's Date: 04/02/2021   History of Present Illness  Maria [name] is a 87 y.o. female with L hip fracture who underwent an IM nail on 03/30/2021 PMH: HTN; afib on Eliquis; CAD; OSA on CPAP; hypothyroidism; CAD s/p CABG; celiac disease; chronic diastolic CHF; and stage 3a CKD  Clinical Impression  Pt making steady progress with mobility and remains highly motivated to return to prior level of function. Continue to feel that pt can benefit from CIR stay prior to return home.     Follow Up Recommendations CIR    Equipment Recommendations  Rolling walker with 5" wheels;3in1 (PT) (YOUTH RW)    Recommendations for Other Services       Precautions / Restrictions Precautions Precautions: Fall Restrictions Weight Bearing Restrictions: Yes LLE Weight Bearing: Weight bearing as tolerated      Mobility  Bed Mobility Overal bed mobility: Needs Assistance Bed Mobility: Sit to Supine       Sit to supine: Min assist   General bed mobility comments: Assist to bring LLE back up into the bed    Transfers Overall transfer level: Needs assistance Equipment used: Rolling walker (2 wheeled) Transfers: Sit to/from Stand Sit to Stand: Min assist;Mod assist         General transfer comment: min assist to bring hips up and for balance from recliner and mod assist to rise from low commode  Ambulation/Gait Ambulation/Gait assistance: Min assist Gait Distance (Feet): 80 Feet (10'x 1, 80' x 1) Assistive device: Rolling walker (2 wheeled) Gait Pattern/deviations: Step-to pattern;Step-through pattern;Decreased stance time - left;Decreased weight shift to left Gait velocity: decr Gait velocity interpretation: <1.31 ft/sec, indicative of household ambulator General Gait Details: Initially pt with step to pattern which improved to step through after 15'. Verbal cues to look up. Pt with one  loss of balance negotiating small rise into the bathroom  Stairs            Wheelchair Mobility    Modified Rankin (Stroke Patients Only)       Balance Overall balance assessment: Needs assistance Sitting-balance support: Feet supported;Bilateral upper extremity supported Sitting balance-Leahy Scale: Fair     Standing balance support: Bilateral upper extremity supported;During functional activity Standing balance-Leahy Scale: Poor Standing balance comment: walker and min guard for static standing                             Pertinent Vitals/Pain Pain Assessment: Faces Faces Pain Scale: Hurts even more Pain Location: L hip Pain Descriptors / Indicators: Grimacing;Guarding Pain Intervention(s): Limited activity within patient's tolerance;Monitored during session;Repositioned    Home Living                        Prior Function                 Hand Dominance        Extremity/Trunk Assessment                Communication      Cognition Arousal/Alertness: Awake/alert Behavior During Therapy: WFL for tasks assessed/performed Overall Cognitive Status: Within Functional Limits for tasks assessed                                        General  Comments      Exercises General Exercises - Lower Extremity Ankle Circles/Pumps: AROM;Both;10 reps;Supine Quad Sets: AROM;Left;Supine;5 reps Heel Slides: AAROM;Left;10 reps;Supine Hip ABduction/ADduction: AAROM;Left;10 reps;Supine   Assessment/Plan    PT Assessment    PT Problem List         PT Treatment Interventions      PT Goals (Current goals can be found in the Care Plan section)       Frequency Min 4X/week   Barriers to discharge        Co-evaluation               AM-PAC PT "6 Clicks" Mobility  Outcome Measure Help needed turning from your back to your side while in a flat bed without using bedrails?: A Little Help needed moving from lying  on your back to sitting on the side of a flat bed without using bedrails?: A Little Help needed moving to and from a bed to a chair (including a wheelchair)?: A Little Help needed standing up from a chair using your arms (e.g., wheelchair or bedside chair)?: A Little Help needed to walk in hospital room?: A Little Help needed climbing 3-5 steps with a railing? : A Lot 6 Click Score: 17    End of Session Equipment Utilized During Treatment: Gait belt Activity Tolerance: Patient tolerated treatment well Patient left: with call bell/phone within reach;in bed;with bed alarm set;with family/visitor present Nurse Communication: Mobility status PT Visit Diagnosis: Unsteadiness on feet (R26.81);Muscle weakness (generalized) (M62.81);Difficulty in walking, not elsewhere classified (R26.2)    Time: 1349-1410 PT Time Calculation (min) (ACUTE ONLY): 21 min   Charges:     PT Treatments $Gait Training: 8-22 mins        [place] Pager [phone] Office [place] 04/02/2021, 2:30 PM

## 2021-04-02 NOTE — Plan of Care (Addendum)
Handoff report given to [facility]. Patient transferring to [phone]. Patient stable no c/o pain. Belongings sent with patient.    Problem: Education: Goal: Knowledge of General Education information will improve Description: Including pain rating scale, medication(s)/side effects and non-pharmacologic comfort measures Outcome: Progressing   Problem: Activity: Goal: Risk for activity intolerance will decrease Outcome: Progressing   Problem: Pain Managment: Goal: General experience of comfort will improve Outcome: Progressing   Problem: Safety: Goal: Ability to remain free from injury will improve Outcome: Progressing   Problem: Skin Integrity: Goal: Risk for impaired skin integrity will decrease Outcome: Progressing

## 2021-04-02 NOTE — Progress Notes (Signed)
Maria Heys, MD   Physician  Physical Medicine and Rehabilitation  PMR Pre-admission     Signed  Date of Service:  04/01/2021  1:11 PM       Related encounter: Admission (Discharged) from 03/28/2021 in [facility] Admission Coordinator Pre-Admission Assessment   Patient: Maria [name] is an 87 y.o., female MRN: [id number] DOB: Jul 29, 1924 Height: 5' 1.5" (156.2 cm) Weight: 46.3 kg   Insurance Information HMO:     PPO:      PCP:      IPA:  80/20: yes     OTHER: PRIMARY: Medicare part A and B  Policy#: [id number]      Subscriber: Pt. Phone#: Verified online 10/30/94   Fax#: Pre-Cert#:       Employer: Benefits:  Phone #:      Name: Eff. Date: Parts A ad B effective 02/23/1991, and 03/25/1992    Deduct: $1556      Out of Pocket Max:  None      Life Max: N/A  CIR: 100%      SNF: 100 days Outpatient: 80%     Co-Pay: 20% Home Health: 100%      Co-Pay: none DME: 80%     Co-Pay: 20% Providers: patient's choice    SECONDARY: AARP     Policy#: [id number]   Phone#:   Financial Counselor:       Phone#:   The "Data Collection Information Summary" for patients in Inpatient Rehabilitation Facilities with attached "Privacy Act [place] Records" was provided and verbally reviewed  with: Patient   Emergency Contact Information Contact Information       Name Relation Home Work Mobile    [place] Daughter [phone]   [phone]    Benedict Needy [phone]   [phone]    Lor, Jacqlyn Larsen Daughter     [phone]       Azia Toutant (daughter)   [phone], [phone]   Current Medical History  Patient Admitting Diagnosis: L hip fracture   History of Present Illness: Patient is a 87 year old female with history of hypertension, A. fib on Eliquis, CAD, OSA on CPAP, hypothyroidism, CAD status post CABG, celiac disease, chronic dCHF, stage IIIa CKD presented with left hip fracture.  Patient was at [facility]).  They are [place] their [place] and went shopping for decoration.  She was getting things into the house and stepped onto a rug and it slipped out from under her.  Patient landed on her left hip.  She had a previous right hip repair in 2014. Patient went to [facility], imaging showed left femur fracture.  X-rays of the left elbow did not show any fracture or dislocation.  CT C-spine did not show any fracture or dislocation. MD requested transfer to [facility].   Orthopedics consulted, underwent left hip percutaneous pinning on 03/30/2021.  PT/OT evaluations were completed with recommendations for inpatient rehab admission.   Patient's medical record from [facility] has been reviewed by the rehabilitation admission coordinator and physician.   Past Medical History      Past Medical History:  Diagnosis Date   Anemia     Atonic bladder 12/11/2013   Carotid artery occlusion      right carotid stenosis 40-60%, 4/08, neg for stenosis repeat study 11/11   Celiac disease     Chronic anticoagulation CHADS2VASC2 score 7 10/18/2014   Chronic diastolic CHF (congestive heart failure) (HCC)      takes Lasix daily   CKD (chronic kidney disease), stage III ([place])     Coronary artery disease      a.  08/2013 (LIMA-LAD, rSVG to  LCX and Lt atrial clip),   Depression     DNR (do not resuscitate) 03/28/2021   DVT (deep venous thrombosis) ([place]) 09/2013   Gastric ulcer      6-7month ago   GERD (gastroesophageal reflux disease)      takes Omeprazole daily   GI bleeding     Hemorrhoids     Hot flashes      takes  ZOloft 3 times a week   Hypertension     Hypothyroidism      takes Synthroid daily as result of AMiodarone   Insomnia     Macular degeneration     Mild aortic insufficiency      a. Echo 10/2013: mild AI.   Moderate mitral regurgitation by prior echocardiogram      a. Echo 10/2013.   Moderate obstructive sleep apnea      uses CPAP;sleep study about 4-49month ago   MVP (mitral valve prolapse)     Osteoporosis     PAF (paroxysmal atrial fibrillation) (HCC)     Peripheral edema      left   Presbycusis     Pulmonary hypertension (HCC)     RLS (restless legs syndrome)     Thyroid nodule      766m no change 11/11   Urethral stricture     Urinary anastomotic stricture     Urinary retention     Vitamin D deficiency        Family History   family history includes AAA (abdominal aortic aneurysm) in her brother; CAD in her father; CVA in her mother; Colon cancer in her father; Heart attack in her brother, father, and mother; Peripheral vascular disease in her brother.   Prior Rehab/Hospitalizations Has the patient had prior rehab or hospitalizations prior to admission? No   Has the patient had major surgery during 100 days prior to admission? Yes              Current Medications   Current Facility-Administered Medications:   acetaminophen (TYLENOL) tablet 325-650 mg, 325-650 mg, Oral, Q6H PRN, LaMarchia BondMD, 650 mg at 03/31/21 2132   alum & mag hydroxide-simeth (MAALOX/MYLANTA) 200-200-20 MG/5ML suspension 30 mL, 30 mL, Oral, Q4H PRN, LaMarchia BondMD   amiodarone (PACERONE) tablet 100 mg, 100 mg, Oral, QHS, Landau, JoVonna KotykMD, 100 mg at 04/01/21 2202   amLODipine (NORVASC) tablet 2.5 mg, 2.5  mg, Oral, Daily, LaMarchia BondMD, 2.5 mg at 04/02/21 083810 apixaban (ELIQUIS) tablet 2.5 mg, 2.5 mg, Oral, BID, LaMarchia BondMD, 2.5 mg at 04/02/21 081751 bisacodyl (DULCOLAX) suppository 10 mg, 10 mg, Rectal, Daily PRN, LaMarchia BondMD   docusate sodium (COLACE) capsule 100 mg, 100 mg, Oral, BID, LaMarchia BondMD, 100 mg at 04/02/21 [id number] feeding supplement (ENSURE ENLIVE / ENSURE PLUS) liquid 237 mL, 237 mL, Oral, BID BM, LaMarchia BondMD, 237 mL at 04/02/21 0806   ferrous sulfate tablet 325 mg, 325 mg, Oral, Q breakfast, Rai, Ripudeep K, MD, 325 mg at 04/02/21 0731   furosemide (LASIX) tablet 20 mg, 20 mg, Oral, Daily, Rai, Ripudeep K, MD, 20 mg at 04/02/21 0808   HYDROcodone-acetaminophen (NORCO/VICODIN) 5-325 MG per tablet 1-2 tablet, 1-2 tablet, Oral, Q4H PRN, LaMarchia BondMD, 1 tablet at 04/01/21 2203   levothyroxine (SYNTHROID) tablet 25 mcg, 25 mcg, Oral, QAC breakfast, LaMarchia BondMD, 25 mcg at 04/02/21 0731   magnesium citrate solution 1 Bottle, 1 Bottle, Oral, Once PRN, LaMarchia BondMD   melatonin tablet 3 mg, 3 mg, Oral, QHS, LaMarchia BondMD, 3 mg at 04/01/21 2203   menthol-cetylpyridinium (CEPACOL) lozenge 3 mg, 1 lozenge, Oral, PRN **OR** phenol (CHLORASEPTIC) mouth spray 1 spray, 1 spray, Mouth/Throat, PRN, LaMarchia BondMD   multivitamin with minerals tablet 1 tablet, 1 tablet, Oral, Daily, LaMarchia BondMD, 1 tablet at 04/02/21 0806   ondansetron (ZOFRAN) tablet 4 mg, 4  mg, Oral, Q6H PRN **OR** ondansetron (ZOFRAN) injection 4 mg, 4 mg, Intravenous, Q6H PRN, Marchia Bond, MD   polyethylene glycol (MIRALAX / GLYCOLAX) packet 17 g, 17 g, Oral, Daily PRN, Rai, Ripudeep K, MD   senna (SENOKOT) tablet 8.6 mg, 1 tablet, Oral, BID, Marchia Bond, MD, 8.6 mg at 04/02/21 1761   sertraline (ZOLOFT) tablet 50 mg, 50 mg, Oral, Daily, Marchia Bond, MD, 50 mg at 04/02/21 6073   Patients Current Diet:  Diet Order                  Diet gluten free Room  service appropriate? Yes with Assist; Fluid consistency: Thin  Diet effective now                         Precautions / Restrictions Precautions Precautions: Fall Restrictions Weight Bearing Restrictions: Yes LLE Weight Bearing: Weight bearing as tolerated    Has the patient had 2 or more falls or a fall with injury in the past year? No   Prior Activity Level  Pt. Went out 3-4x a week   Prior Functional Level Self Care: Did the patient need help bathing, dressing, using the toilet or eating? Independent   Indoor Mobility: Did the patient need assistance with walking from room to room (with or without device)? Independent   Stairs: Did the patient need assistance with internal or external stairs (with or without device)? Independent   Functional Cognition: Did the patient need help planning regular tasks such as shopping or remembering to take medications? Needed some help   Home Assistive Devices / [place] Devices/Equipment: Cane (specify quad or straight) Home Equipment: Cane - single point   Prior Device Use: Indicate devices/aids used by the patient prior to current illness, exacerbation or injury? None of the above   Current Functional Level Cognition   Overall Cognitive Status: Within Functional Limits for tasks assessed Orientation Level: Oriented X4 General Comments: need hearing aides    Extremity Assessment (includes Sensation/Coordination)   Upper Extremity Assessment: Overall WFL for tasks assessed  Lower Extremity Assessment: Defer to PT evaluation LLE Deficits / Details: minimal AROM of L hip due to pain, ginger with knee flexion due to pull on hip     ADLs   Overall ADL's : Needs assistance/impaired Eating/Feeding: Independent, Sitting Grooming: Wash/dry hands, Wash/dry face, Oral care, Applying deodorant, Brushing hair, Set up, Sitting Upper Body Bathing: Minimal assistance, Sitting Lower Body Bathing: Moderate assistance, Sit  to/from stand Upper Body Dressing : Set up, Sitting Lower Body Dressing: Moderate assistance, Sit to/from stand Toilet Transfer: Minimal assistance, RW, Grab bars, Ambulation, Cueing for safety Toileting- Clothing Manipulation and Hygiene: Min guard, Sitting/lateral lean Functional mobility during ADLs: Minimal assistance, Rolling walker General ADL Comments: assistance levels for lower body ADLs due to pain in L hip as well as dizziness that occurs with bending     Mobility   Overal bed mobility: Needs Assistance Bed Mobility: Supine to Sit Supine to sit: Min assist, Mod assist General bed mobility comments: pt in chair upon arrival     Transfers   Overall transfer level: Needs assistance Equipment used: Rolling walker (2 wheeled) Transfers: Sit to/from Stand Sit to Stand: Min assist General transfer comment: light min A to boost up - min A for balance in standing; pt became dizzy after the change in position     Ambulation / Gait / Stairs / Wheelchair Mobility   Ambulation/Gait Ambulation/Gait assistance: [employer]  assist Gait Distance (Feet): 75 Feet Assistive device: Rolling walker (2 wheeled) Gait Pattern/deviations: Step-to pattern, Step-through pattern General Gait Details: pt initially very antalgic with step gait pattern, s/p 20' pt able to amb with reciprocal short step length pattern with minA to keep RW moving forward instead of stopping after each step to promoted fluidity, pt c/o "My shoulders are going to give out" pt given youth RW Gait velocity: slow Gait velocity interpretation: <1.31 ft/sec, indicative of household ambulator     Posture / Balance Dynamic Sitting Balance Sitting balance - Comments: pt able to participate in seated L LE exercises Balance Overall balance assessment: Needs assistance Sitting-balance support: Feet supported Sitting balance-Leahy Scale: Fair Sitting balance - Comments: pt able to participate in seated L LE exercises Standing balance  support: Bilateral upper extremity supported, During functional activity Standing balance-Leahy Scale: Poor Standing balance comment: dependent on RW     Special needs/care consideration Skin Has post op incision post L hip IM nailing.    Previous Home Environment (from acute therapy documentation) Care Facility Name: [place]: No   Discharge Living Setting Plans for Discharge Living Setting: Other (Comment) Type of Home at Discharge: [place] Name at Discharge: [place] Discharge Home Layout: One level Discharge Home Access: Level entry Discharge Bathroom Shower/Tub: Walk-in shower Discharge Bathroom Toilet: Standard Discharge Bathroom Accessibility: Yes How Accessible: Accessible via walker, Accessible via wheelchair Does the patient have any problems obtaining your medications?: No   Social/Family/Support Systems Patient Roles: Other (Comment) Contact Information: [phone] Anticipated Caregiver: Jacqlyn Larsen (daughter) Anticipated Caregiver's Contact Information: [phone] Ability/Limitations of Caregiver: none- daughters can stay for a few days, then will hire paid caregivers at [place] Availability: 24/7 Discharge Plan Discussed with Primary Caregiver: Yes Is Caregiver In Agreement with Plan?: Yes Does Caregiver/Family have Issues with Lodging/Transportation while Pt is in Rehab?: No   Goals Patient/Family Goal for Rehab: PT/OT Mod I Expected length of stay: 8-10 days Cultural Considerations: 24/7 Pt/Family Agrees to Admission and willing to participate: Yes Program Orientation Provided & Reviewed with Pt/Caregiver Including Roles  & Responsibilities: Yes   Decrease burden of Care through IP rehab admission: Specialzed equipment needs, Diet advancement, Decrease number of caregivers, Bowel and bladder program and Patient/family education   Possible need for SNF placement upon discharge: not  anticipated   Patient Condition: I have reviewed medical records from [facility], spoken with CM, and patient and daughter. I met with patient at the bedside and discussed via phone for inpatient rehabilitation assessment.  Patient will benefit from ongoing PT and OT, can actively participate in 3 hours of therapy a day 5 days of the week, and can make measurable gains during the admission.  Patient will also benefit from the coordinated team approach during an Inpatient Acute Rehabilitation admission.  The patient will receive intensive therapy as well as Rehabilitation physician, nursing, social worker, and care management interventions.  Due to safety, skin/wound care, disease management, medication administration, pain management, and patient education the patient requires 24 hour a day rehabilitation nursing.  The patient is currently min to mod assist with mobility and basic ADLs.  Discharge setting and therapy post discharge at home with home health is anticipated.  Patient has agreed to participate in the Acute Inpatient Rehabilitation Program and will admit today.   Preadmission Screen Completed By:  Retta Diones, 04/02/2021 11:16 AM ______________________________________________________________________   Discussed status with Dr. Dagoberto Ligas and Dr. Naaman Plummer on 04/02/21 at 0930  and received approval for admission today.   Admission Coordinator:  Retta Diones, RN, time 1117/Date 04/02/21    Assessment/Plan: Diagnosis: Does the need for close, 24 hr/day Medical supervision in concert with the patient's rehab needs make it unreasonable for this patient to be served in a less intensive setting? Yes Co-Morbidities requiring supervision/potential complications: Afib, on eliquis, CAD/CABG, dCHF, CKD3A, L hip/femur fx s/p pinning Due to bladder management, bowel management, safety, skin/wound care, disease management, medication administration, pain management, and patient education, does the patient  require 24 hr/day rehab nursing? Yes Does the patient require coordinated care of a physician, rehab nurse, PT, OT, and SLP to address physical and functional deficits in the context of the above medical diagnosis(es)? Yes Addressing deficits in the following areas: balance, endurance, locomotion, strength, transferring, bathing, dressing, feeding, grooming, and toileting Can the patient actively participate in an intensive therapy program of at least 3 hrs of therapy 5 days a week? Yes The potential for patient to make measurable gains while on inpatient rehab is good and fair Anticipated functional outcomes upon discharge from inpatient rehab: modified independent and supervision PT, modified independent and supervision OT, n/a SLP Estimated rehab length of stay to reach the above functional goals is: 8-10 days Anticipated discharge destination: Home 10. Overall Rehab/Functional Prognosis: good and fair     MD Signature:            Revision History                                                 Note Details  Jan Fireman, MD File Time 04/02/2021 11:33 AM  Author Type Physician Status Signed  Last Editor Maria Heys, MD Service Physical Medicine and [place] # [id number] Admit Date 04/02/2021

## 2021-04-02 NOTE — Progress Notes (Signed)
Pt arrived via wheelchair. Pt alert and oriented with daughter at bedside. Pt oriented to unit and all questions and concerns were addressed. Pt left in room with call bell in place and alarm on.

## 2021-04-02 NOTE — H&P (Signed)
Physical Medicine and Rehabilitation Admission H&P     CC: Functional deficits due to femur fracture s/p repair     HPI: Maria [name] is a 87 year old female with history of CAD, CKD III, Celiac disease, COPD, atonic bladder, PAF- on Eliquis who sustained a fall with nondisplaced left basicervical femur fracture while at [facility] and was transferred back to [facility] on 03/28/21 for management. She was transitioned to IV heparin and underwent left hip pinning by Dr. Mardelle Matte on 06/06. Post op WBAT and Eliquis resumed the next day.  Hospital course significant for ABLA and stable hyponatremia. Therapy ongoing and patient continues to be limited by pain and weakness affecting mobility and ADLs. CIR recommended due to functional decline.   Pt reports having multiple Bms- doing well; voids "OK"- but has atonic bladder. Norco was making her nauseated, however she was taking on empty stomach. If sits still, no pain, but hurts A LOT per pt, when moves at all.      Review of Systems Constitutional:  Negative for chills and fever. HENT:  Negative for hearing loss and tinnitus.   Eyes:  Negative for blurred vision and double vision. Respiratory:  Positive for shortness of breath (with activity). Negative for cough.   Cardiovascular:  Negative for chest pain and palpitations. Gastrointestinal:  Positive for diarrhea (chronic--worse in hospital) and nausea. Negative for abdominal pain. Genitourinary:  Positive for dysuria. Musculoskeletal:  Positive for back pain (chronic), joint pain (right hip) and myalgias. Skin:  Negative for rash. Neurological:  Positive for dizziness and weakness. Negative for headaches. Psychiatric/Behavioral:  The patient is nervous/anxious and has insomnia.   All other systems reviewed and are negative.         Past Medical History:  Diagnosis Date   Anemia     Atonic bladder 12/11/2013   Carotid artery occlusion      right carotid stenosis 40-60%, 4/08, neg for  stenosis repeat study 11/11   Celiac disease     Chronic anticoagulation CHADS2VASC2 score 7 10/18/2014   Chronic diastolic CHF (congestive heart failure) (HCC)      takes Lasix daily   CKD (chronic kidney disease), stage III ([place])     Coronary artery disease      a.  08/2013 (LIMA-LAD, rSVG to LCX and Lt atrial clip),   Depression     DNR (do not resuscitate) 03/28/2021   DVT (deep venous thrombosis) ([place]) 09/2013   Gastric ulcer      6-34month ago   GERD (gastroesophageal reflux disease)      takes Omeprazole daily   GI bleeding     Hemorrhoids     Hot flashes      takes ZOloft 3 times a week   Hypertension     Hypothyroidism      takes Synthroid daily as result of AMiodarone   Insomnia     Macular degeneration     Mild aortic insufficiency      a. Echo 10/2013: mild AI.   Moderate mitral regurgitation by prior echocardiogram      a. Echo 10/2013.   Moderate obstructive sleep apnea      uses CPAP;sleep study about 4-554monthago   MVP (mitral valve prolapse)     Osteoporosis     PAF (paroxysmal atrial fibrillation) (HCC)     Peripheral edema      left   Presbycusis     Pulmonary hypertension (HCC)     RLS (restless legs syndrome)  Thyroid nodule      20m, no change 11/11   Urethral stricture     Urinary anastomotic stricture     Urinary retention     Vitamin D deficiency             Past Surgical History:  Procedure Laterality Date   APPENDECTOMY       bilateral cataract surgery       CARDIAC CATHETERIZATION   08-06-13   CORONARY ARTERY BYPASS GRAFT N/A 08/28/2013    Procedure: CORONARY ARTERY BYPASS GRAFTING (CABG) x2 using right greater saphenous vein and left internal mammary artery. ;  Surgeon: EGrace Isaac MD;  [place]  Service: Open Heart Surgery;  Laterality: N/A;   HIP ARTHROPLASTY Right 12/09/2013    Procedure: ARTHROPLASTY BIPOLAR HIP CEMENTED;  Surgeon: FKerin Salen MD;  [place]  Service: Orthopedics;  Laterality: Right;   HIP  PINNING,CANNULATED Left 03/30/2021    Procedure: CANNULATED HIP PINNING;  Surgeon: LMarchia Bond MD;  [place]  Service: Orthopedics;  Laterality: Left;   INTRAOPERATIVE TRANSESOPHAGEAL ECHOCARDIOGRAM N/A 08/28/2013    Procedure: INTRAOPERATIVE TRANSESOPHAGEAL ECHOCARDIOGRAM;  Surgeon: EGrace Isaac MD;  [place]  Service: Open Heart Surgery;  Laterality: N/A;   LEFT AND RIGHT HEART CATHETERIZATION WITH CORONARY ANGIOGRAM N/A 08/16/2013    Procedure: LEFT AND RIGHT HEART CATHETERIZATION WITH CORONARY ANGIOGRAM;  Surgeon: HSinclair Grooms MD;  [facility];  Service: Cardiovascular;  Laterality: N/A;   MANDIBLE SURGERY       TCS       TONSILLECTOMY       trigger thumb       TUBAL LIGATION               Family History  Problem Relation Age of Onset   Heart attack Mother     CVA Mother     CAD Father     Colon cancer Father     Heart attack Father     Heart attack Brother     Peripheral vascular disease Brother     AAA (abdominal aortic aneurysm) Brother        Social History:  Widowed. 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 She reports that she has never smoked. She has never used smokeless tobacco. She reports that she does not drink alcohol and does not use drugs.          Allergies  Allergen Reactions   Fentanyl Nausea And Vomiting      Severe n/v   Amoxicillin Nausea Only and Other (See Comments)      dizziness   Atorvastatin Other (See Comments)      Unknown reaction - reported by ESadie Haber  Bupropion Other (See Comments)      nightmares   Codeine Nausea And Vomiting   Fosamax [Alendronate Sodium] Other (See Comments)      Jaw pain   Gabapentin Other (See Comments)      dizziness   Gluten Meal Other (See Comments)      Celiac Disease     Hydrochlorothiazide Other (See Comments)      Hyponatremia, GI upset     Tetanus Toxoid, Adsorbed Swelling      Severe swelling at site of injection (pt tolerates 1/2 injection  then 1/2 week later)            Medications Prior to Admission  Medication Sig Dispense Refill   acetaminophen (  TYLENOL) 500 MG tablet Take 1,000 mg by mouth every 6 (six) hours as needed (pain).       amiodarone (PACERONE) 200 MG tablet Take 0.5 tablets (100 mg total) by mouth daily. (Patient taking differently: Take 100 mg by mouth at bedtime.) 15 tablet 11   amLODipine (NORVASC) 2.5 MG tablet Take 1 tablet (2.5 mg total) by mouth daily for 30 days. 30 tablet 0   apixaban (ELIQUIS) 2.5 MG TABS tablet Take 1 tablet (2.5 mg total) by mouth 2 (two) times daily for 30 days. 60 tablet 0   furosemide (LASIX) 20 MG tablet Take 1 tablet (20 mg total) by mouth daily as needed for fluid or edema. (Patient taking differently: Take 20 mg by mouth every morning.) 30 tablet     levothyroxine (SYNTHROID) 25 MCG tablet Take 25 mcg by mouth daily before breakfast.       magnesium hydroxide (MILK OF MAGNESIA) 400 MG/5ML suspension Take 15 mLs by mouth at bedtime as needed for mild constipation.       melatonin 5 MG TABS Take 5 mg by mouth at bedtime.       Menthol, Topical Analgesic, (ICY HOT EX) Apply 1 application topically daily as needed (pain).       Polyethyl Glycol-Propyl Glycol (SYSTANE) 0.4-0.3 % SOLN Place 1 drop into both eyes at bedtime.       sertraline (ZOLOFT) 50 MG tablet Take 50 mg by mouth every morning.          Drug Regimen Review  Drug regimen was reviewed and remains appropriate with no significant issues identified   Home: Home Living Family/patient expects to be discharged to:: Assisted living Home Equipment: Cane - single point   Functional History: Prior Function Level of Independence: Independent Comments: Pt was indep in ADLs, and had assistance for some IADLs such as cooking and cleaning. pt still drives, manages all finances, pt was shopping and carrying bags when she slipped on a throw rug   Functional Status:  Mobility: Bed Mobility Overal bed mobility: Needs  Assistance Bed Mobility: Supine to Sit Supine to sit: Min assist, Mod assist General bed mobility comments: pt in chair upon arrival Transfers Overall transfer level: Needs assistance Equipment used: Rolling walker (2 wheeled) Transfers: Sit to/from Stand Sit to Stand: Min assist General transfer comment: light min A to boost up - min A for balance in standing; pt became dizzy after the change in position Ambulation/Gait Ambulation/Gait assistance: Min assist Gait Distance (Feet): 75 Feet Assistive device: Rolling walker (2 wheeled) Gait Pattern/deviations: Step-to pattern, Step-through pattern General Gait Details: pt initially very antalgic with step gait pattern, s/p 20' pt able to amb with reciprocal short step length pattern with minA to keep RW moving forward instead of stopping after each step to promoted fluidity, pt c/o "My shoulders are going to give out" pt given youth RW Gait velocity: slow Gait velocity interpretation: <1.31 ft/sec, indicative of household ambulator   ADL: ADL Overall ADL's : Needs assistance/impaired Eating/Feeding: Independent, Sitting Grooming: Wash/dry hands, Wash/dry face, Oral care, Applying deodorant, Brushing hair, Set up, Sitting Upper Body Bathing: Minimal assistance, Sitting Lower Body Bathing: Moderate assistance, Sit to/from stand Upper Body Dressing : Set up, Sitting Lower Body Dressing: Moderate assistance, Sit to/from stand Toilet Transfer: Minimal assistance, RW, Grab bars, Ambulation, Cueing for safety Toileting- Clothing Manipulation and Hygiene: Min guard, Sitting/lateral lean Functional mobility during ADLs: Minimal assistance, Rolling walker General ADL Comments: assistance levels for lower body ADLs due to  pain in L hip as well as dizziness that occurs with bending   Cognition: Cognition Overall Cognitive Status: Within Functional Limits for tasks assessed Orientation Level: Oriented X4 Cognition Arousal/Alertness:  Awake/alert Behavior During Therapy: WFL for tasks assessed/performed Overall Cognitive Status: Within Functional Limits for tasks assessed General Comments: need hearing aides     Blood pressure (!) 153/67, pulse 65, temperature 97.8 F (36.6 C), temperature source Oral, resp. rate 16, height 5' 1.5" (1.562 m), weight 46.3 kg, SpO2 93 %. Physical Exam Vitals and nursing note reviewed. Constitutional:      Comments: Thin female. Appears younger than state age. Slightly frail, but not as much as expected; sitting up in bedside chair, NAD  HENT:    Head: Normocephalic and atraumatic.    Right Ear: External ear normal.    Left Ear: External ear normal.    Nose: Nose normal. No congestion.    Mouth/Throat:    Mouth: Mucous membranes are dry.    Pharynx: Oropharynx is clear. No oropharyngeal exudate. Eyes:    General:        Right eye: No discharge.        Left eye: No discharge.    Extraocular Movements: Extraocular movements intact. Cardiovascular:    Rate and Rhythm: Normal rate. Rhythm irregular.    Heart sounds: Normal heart sounds. No murmur heard.   No gallop. Pulmonary:    Breath sounds: Rhonchi present.    Comments: A little coarse, but otherwise, good air movement noted- no W/R Abdominal:    Comments: Soft, NT, ND, (+)BS      Musculoskeletal:        General: Tenderness (Bilateral shins with dysesthesias) present.    Cervical back: Normal range of motion and neck supple.    Comments: Ue's 5/5 in all muscles tested- B/L RLE- HF, 4+/5, otherwise 5/5 LLE- HF 2-/5 due to pain; KE/KF NT due to pain; DF and PF at least 4/5- due to pain Dry dressing on left hip- very TTP ver L lateral hip  Skin:    General: Skin is warm and dry.    Comments: R forearm IV- looks OK. Has L hip incision- steristrips under dressing, but cannot get look due to pain; No other skin breakdown  Neurological:    General: No focal deficit present.    Mental Status: She is alert and oriented to  person, place, and time.    Comments: Sensation decreased due to neuropathy from mid calves down B/L       Lab Results Last 48 Hours        Results for orders placed or performed during the hospital encounter of 03/28/21 (from the past 48 hour(s))  VITAMIN D 25 Hydroxy (Vit-D Deficiency, Fractures)     Status: Abnormal    Collection Time: 04/01/21  3:08 AM  Result Value Ref Range    Vit D, 25-Hydroxy 18.92 (L) 30 - 100 ng/mL      Comment: (NOTE) Vitamin D deficiency has been defined by the Industry and an Endocrine Society practice guideline as a level of serum 25-OH vitamin D less than 20 ng/mL (1,2). The Endocrine Society went on to further define vitamin D insufficiency as a level between 21 and 29 ng/mL (2).   1. IOM (Institute of Medicine). 2010. Dietary reference intakes for calcium and D. [place]: The [employer]. 2. Holick MF, Binkley [place], Bischoff-Ferrari HA, et al. Evaluation, treatment, and prevention of vitamin D deficiency: an Endocrine Society clinical  practice guideline, JCEM. 2011 Jul; 96(7): 1911-30.   Performed at [facility], [place] [address], [place], [place] [address]    CBC     Status: Abnormal    Collection Time: 04/01/21  3:08 AM  Result Value Ref Range    WBC 7.1 4.0 - 10.5 K/uL    RBC 3.55 (L) 3.87 - 5.11 MIL/uL    Hemoglobin 10.7 (L) 12.0 - 15.0 g/dL    HCT 31.8 (L) 36.0 - 46.0 %    MCV 89.6 80.0 - 100.0 fL    MCH 30.1 26.0 - 34.0 pg    MCHC 33.6 30.0 - 36.0 g/dL    RDW 14.9 11.5 - 15.5 %    Platelets 250 150 - 400 K/uL    nRBC 0.0 0.0 - 0.2 %      Comment: Performed at [facility], [place] [address] [place], [place] [address]  Basic metabolic panel     Status: Abnormal    Collection Time: 04/01/21  3:08 AM  Result Value Ref Range    Sodium 130 (L) 135 - 145 mmol/L    Potassium 3.7 3.5 - 5.1 mmol/L    Chloride 98 98 - 111 mmol/L    CO2 23 22 - 32 mmol/L    Glucose, Bld 91 70 - 99 mg/dL      Comment:  Glucose reference range applies only to samples taken after fasting for at least 8 hours.    BUN 16 8 - 23 mg/dL    Creatinine, Ser 0.62 0.44 - 1.00 mg/dL    Calcium 8.7 (L) 8.9 - 10.3 mg/dL    GFR, Estimated >60 >60 mL/min      Comment: (NOTE) Calculated using the CKD-EPI Creatinine Equation (2021)      Anion gap 9 5 - 15      Comment: Performed at [facility] [address], [place],  [address]      Imaging Results (Last 48 hours)  No results found.           Medical Problem List and Plan: 1.  Impaired gait and function secondary L femur fx s/p percutaneous pinning- WBAT             -patient may  shower             -ELOS/Goals: 2-3 weeks- supervision to mod I 2.  Antithrombotics: -DVT/anticoagulation:  Continue Eliquis             -antiplatelet therapy:  N/A 3. Pain Management: Continue hydrocodone prn             -- 4. Mood: LCSW to follow for evaluation and support.              -antipsychotic agents: N/A 5. Neuropsych: This patient is capable of making decisions on hee own behalf. 6. Skin/Wound Care: Routine pressure relief measures.             --Monitor incision for healing. 7. Fluids/Electrolytes/Nutrition: Monitor I/O. Check lytes in am.             -- 8. PAF: Monitor HR TID--continue Pacerone and Eliquis 9. CAD s/p CABG/chronic diastolic CHF: Will check weights daily and monitor for signs of overload. Continue Lasix.             --heart healthy diet.   10 HTN: Will monitor BP tid--continue Lasix and Norvasc. 11. H/o COPD/DOE:  Monitor for symptoms with increase in activity. 12. RLS/Chronic insomnia:  On melatonin--takes 5-10 mg prn at home.   13. Idiopathic peripheral polyneuropathy: Reports that legs are numb/does not have sensation to urinate but voiding without difficulty. --has seen Dr. [name] in the past.   14. Depression: On Zoloft for mood stabilization since pandemic.          Bary Leriche, PA-C 04/02/2021    I have personally performed  a face to face diagnostic evaluation of this patient and formulated the key components of the plan.  Additionally, I have personally reviewed laboratory data, imaging studies, as well as relevant notes and concur with the physician assistant's documentation above.

## 2021-04-02 NOTE — Progress Notes (Signed)
Lower extremity venous has been completed.   Preliminary results in CV Proc.   Abram Sander 04/02/2021 11:35 AM

## 2021-04-02 NOTE — Discharge Summary (Signed)
Physician Discharge Summary  Maria [name] ZTI:[id number] DOB: 07/23/1924 DOA: 03/28/2021  PCP: [name], MD  Admit date: 03/28/2021 Discharge date: 04/02/2021  Admitted From: Home Disposition: Inpatient Rehabilitation  Recommendations for Outpatient Follow-up:  Please obtain BMP/CBC in one week Please follow up on the following pending results: Venous duplex   Discharge Condition: Stable CODE STATUS: DNR Diet recommendation: Gluten Free Diet   Brief/Interim Summary:  Admission HPI written by Karmen Bongo, MD   HPI: Maria [name] is a 87 y.o. female with medical history significant of HTN; afib on Eliquis; CAD; OSA on CPAP; hypothyroidism; CAD s/p CABG; celiac disease; chronic diastolic CHF; and stage 3a CKD presenting with L hip fracture.  She was at [facility].  [place] are [place] their [place] and went shopping for things to decorate.  She was carrying things into the house and stepped into the house and stepped onto a rug and it slipped out from under her; they had bought it and it did not have a backing on it.  She landed on her L hip.  She previously had a R hip repair in about 2014.  She is "settling down" now, the ambulance ride was not very comfortable.   We discussed rehab and it is ideal that she lives at [facility].  However, if possible she prefers to go to CIR instead.    Hospital course:  Left hip fracture Secondary to fall. No reported syncope. Orthopedic surgery consulted on admission and patient underwent percutaneous pinning on 03/30/2021. Recommendations to resume home Eliquis to also use as DVT prophylaxis, WBAT of left leg. PT/OT consulted and have recommended CIR.   Paroxysmal atrial fibrillation Continue Eliquis 2.5 mg BID and amiodarone 100 mg daily.   Primary hypertension Continue amlodipine 2.5 mg daily   OSA Continue CPAP qhs   Hyponatremia Mild. Chronic issue. Asymptomatic. Stable.   Hypothyroidism Continue  Synthroid 25 mcg daily   CKD stage IIIa Stable.   Chronic diastolic heart failure Compensated. Continue furosemide 20 mg daily   Acute anemia Baseline hemoglobin of 12-13. Drift down to around 11 and stable.   Depression Continue Zoloft 50 mg daily   Insomnia Continue melatonin 5 mg qhs   Right lower leg pain Patient has concern for possible DVT. LE venous duplex ordered prior to discharge.  Discharge Diagnoses:  Principal Problem:   Closed left hip fracture, initial encounter [facility]) Active Problems:   Essential hypertension, benign   Paroxysmal atrial fibrillation (HCC)   Hypothyroidism   CKD (chronic kidney disease), stage III ([place])   Celiac disease   DNR (do not resuscitate)   Fracture    Discharge Instructions  Discharge Instructions     Diet - low sodium heart healthy   Complete by: As directed    No wound care   Complete by: As directed    Weight bearing as tolerated   Complete by: As directed       Allergies as of 04/02/2021       Reactions   Fentanyl Nausea And Vomiting   Severe n/v   Amoxicillin Nausea Only, Other (See Comments)   dizziness   Atorvastatin Other (See Comments)   Unknown reaction - reported by Sadie Haber   Bupropion Other (See Comments)   nightmares   Codeine Nausea And Vomiting   Fosamax [alendronate Sodium] Other (See Comments)   Jaw pain   Gabapentin Other (See Comments)   dizziness   Gluten Meal Other (See Comments)  Celiac Disease   Hydrochlorothiazide Other (See Comments)   Hyponatremia, GI upset   Tetanus Toxoid, Adsorbed Swelling   Severe swelling at site of injection (pt tolerates 1/2 injection then 1/2 week later)        Medication List     TAKE these medications    acetaminophen 500 MG tablet Commonly known as: TYLENOL Take 1,000 mg by mouth every 6 (six) hours as needed (pain).   amLODipine 2.5 MG tablet Commonly known as: NORVASC Take 1 tablet (2.5 mg total) by mouth daily for 30 days.   apixaban 2.5  MG Tabs tablet Commonly known as: ELIQUIS Take 1 tablet (2.5 mg total) by mouth 2 (two) times daily for 30 days.   feeding supplement Liqd Take 237 mLs by mouth 2 (two) times daily between meals.   ferrous sulfate 325 (65 FE) MG tablet Take 1 tablet (325 mg total) by mouth daily with breakfast. Start taking on: April 03, 2021   furosemide 20 MG tablet Commonly known as: LASIX Take 1 tablet (20 mg total) by mouth every morning.   HYDROcodone-acetaminophen 5-325 MG tablet Commonly known as: NORCO/VICODIN Take 1-2 tablets by mouth every 4 (four) hours as needed for moderate pain (pain score 4-6).   ICY HOT EX Apply 1 application topically daily as needed (pain).   levothyroxine 25 MCG tablet Commonly known as: SYNTHROID Take 25 mcg by mouth daily before breakfast.   magnesium hydroxide 400 MG/5ML suspension Commonly known as: MILK OF MAGNESIA Take 15 mLs by mouth at bedtime as needed for mild constipation.   melatonin 5 MG Tabs Take 5 mg by mouth at bedtime.   multivitamin with minerals Tabs tablet Take 1 tablet by mouth daily. Start taking on: April 03, 2021   polyethylene glycol 17 g packet Commonly known as: MIRALAX / GLYCOLAX Take 17 g by mouth daily as needed for moderate constipation or mild constipation.   sertraline 50 MG tablet Commonly known as: ZOLOFT Take 50 mg by mouth every morning.   Systane 0.4-0.3 % Soln Generic drug: Polyethyl Glycol-Propyl Glycol Place 1 drop into both eyes at bedtime.       ASK your doctor about these medications    amiodarone 200 MG tablet Commonly known as: PACERONE Take 0.5 tablets (100 mg total) by mouth daily.               Discharge Care Instructions  (From admission, onward)           Start     Ordered   03/31/21 0000  Weight bearing as tolerated        03/31/21 1003            Follow-up Information     Marchia Bond, MD. Schedule an appointment as soon as possible for a visit in 2 weeks.    Specialty: Orthopedic Surgery Contact information: [address] [place] [address] [phone]                Allergies  Allergen Reactions   Fentanyl Nausea And Vomiting    Severe n/v   Amoxicillin Nausea Only and Other (See Comments)    dizziness   Atorvastatin Other (See Comments)    Unknown reaction - reported by Sadie Haber   Bupropion Other (See Comments)    nightmares   Codeine Nausea And Vomiting   Fosamax [Alendronate Sodium] Other (See Comments)    Jaw pain   Gabapentin Other (See Comments)    dizziness  Gluten Meal Other (See Comments)    Celiac Disease    Hydrochlorothiazide Other (See Comments)    Hyponatremia, GI upset    Tetanus Toxoid, Adsorbed Swelling    Severe swelling at site of injection (pt tolerates 1/2 injection then 1/2 week later)    Consultations: Orthopedic surgery   Procedures/Studies: DG CHEST PORT 1 VIEW  Result Date: 03/28/2021 CLINICAL DATA:  Preoperative evaluation LEFT hip fracture EXAM: PORTABLE CHEST 1 VIEW COMPARISON:  Portable exam at 1839 hours compared to 03/27/2020 FINDINGS: Enlargement of cardiac silhouette post CABG and LEFT atrial appendage clipping. Mediastinal contours and pulmonary vascularity normal. Atherosclerotic calcification aorta. Emphysematous and bronchitic changes consistent with COPD. Small LEFT pleural effusion and bibasilar atelectasis. Remaining lungs clear. No pulmonary infiltrate or pneumothorax. Bones demineralized. IMPRESSION: Enlargement of cardiac silhouette post CABG and electric atrial appendage clipping. COPD with minimal bibasilar atelectasis and small LEFT pleural effusion. Aortic Atherosclerosis (ICD10-I70.0) and Emphysema (ICD10-J43.9). Electronically Signed   By: Lavonia Dana M.D.   On: 03/28/2021 19:21   DG C-Arm 1-60 Min  Result Date: 03/30/2021 CLINICAL DATA:  Cannulated hip pinning. EXAM: OPERATIVE LEFT HIP (WITH PELVIS IF PERFORMED) TECHNIQUE: Fluoroscopic spot image(s)  were submitted for interpretation post-operatively. COMPARISON:  Preoperative radiograph yesterday. FINDINGS: Two fluoroscopic spot views of the left hip obtained in frontal and cross-table lateral projection demonstrate 3 cannulated screws traversing femoral neck fracture. Fluoroscopy time 21 seconds. IMPRESSION: Procedural fluoroscopy for left femoral neck fracture pinning. Electronically Signed   By: Keith Rake M.D.   On: 03/30/2021 20:23   DG HIP PORT UNILAT WITH PELVIS 1V LEFT  Result Date: 03/30/2021 CLINICAL DATA:  Status post left hip surgery. EXAM: DG HIP (WITH OR WITHOUT PELVIS) 1V PORT LEFT COMPARISON:  March 29, 2021. FINDINGS: Status post surgical internal fixation of proximal left femoral neck fracture. Good alignment of fracture components is noted. IMPRESSION: Status post surgical internal fixation of proximal left femoral neck fracture. Electronically Signed   By: Marijo Conception M.D.   On: 03/30/2021 16:23   DG HIP OPERATIVE UNILAT WITH PELVIS LEFT  Result Date: 03/30/2021 CLINICAL DATA:  Cannulated hip pinning. EXAM: OPERATIVE LEFT HIP (WITH PELVIS IF PERFORMED) TECHNIQUE: Fluoroscopic spot image(s) were submitted for interpretation post-operatively. COMPARISON:  Preoperative radiograph yesterday. FINDINGS: Two fluoroscopic spot views of the left hip obtained in frontal and cross-table lateral projection demonstrate 3 cannulated screws traversing femoral neck fracture. Fluoroscopy time 21 seconds. IMPRESSION: Procedural fluoroscopy for left femoral neck fracture pinning. Electronically Signed   By: Keith Rake M.D.   On: 03/30/2021 20:23   DG HIP UNILAT WITH PELVIS 2-3 VIEWS LEFT  Result Date: 03/29/2021 CLINICAL DATA:  Close left hip fracture EXAM: DG HIP (WITH OR WITHOUT PELVIS) 2-3V LEFT COMPARISON:  CT September 03, 2020. FINDINGS: Essentially nondisplaced basicervical fracture of the left femur. Prior right total hip arthroplasty. IMPRESSION: Essentially nondisplaced  basicervical fracture of the left femur. Electronically Signed   By: Dahlia Bailiff MD   On: 03/29/2021 12:26      Subjective: No significant concerns. Still with some right lower leg/calf pain.  Discharge Exam: Vitals:   04/01/21 2201 04/02/21 0811  BP: 129/63 (!) 153/67  Pulse: 68 65  Resp: 16 16  Temp: 98.6 F (37 C) 97.8 F (36.6 C)  SpO2: 97% 93%   Vitals:   04/01/21 0355 04/01/21 1538 04/01/21 2201 04/02/21 0811  BP: (!) 147/63 (!) 142/53 129/63 (!) 153/67  Pulse: (!) 56 66 68 65  Resp: 18 16  16 16  Temp: 98.1 F (36.7 C) 97.9 F (36.6 C) 98.6 F (37 C) 97.8 F (36.6 C)  TempSrc:  Oral Oral Oral  SpO2: 93% 94% 97% 93%  Weight:      Height:        General: Pt is alert, awake, not in acute distress Cardiovascular: RRR, S1/S2 +, no rubs, no gallops Respiratory: CTA bilaterally, no wheezing, no rhonchi Abdominal: Soft, NT, ND, bowel sounds + Extremities: no edema, no cyanosis    The results of significant diagnostics from this hospitalization (including imaging, microbiology, ancillary and laboratory) are listed below for reference.     Microbiology: Recent Results (from the past 240 hour(s))  SARS CORONAVIRUS 2 (TAT 6-24 HRS) Nasopharyngeal Nasopharyngeal Swab     Status: None   Collection Time: 03/28/21  5:37 PM   Specimen: Nasopharyngeal Swab  Result Value Ref Range Status   SARS Coronavirus 2 NEGATIVE NEGATIVE Final    Comment: (NOTE) SARS-CoV-2 target nucleic acids are NOT DETECTED.  The SARS-CoV-2 RNA is generally detectable in upper and lower respiratory specimens during the acute phase of infection. Negative results do not preclude SARS-CoV-2 infection, do not rule out co-infections with other pathogens, and should not be used as the sole basis for treatment or other patient management decisions. Negative results must be combined with clinical observations, patient history, and epidemiological information. The expected result is  Negative.  Fact Sheet for Patients: SugarRoll.be  Fact Sheet for Healthcare Providers: https://www.woods-mathews.com/  This test is not yet approved or cleared by the [place] FDA and  has been authorized for detection and/or diagnosis of SARS-CoV-2 by FDA under an Emergency Use Authorization (EUA). This EUA will remain  in effect (meaning this test can be used) for the duration of the COVID-19 declaration under Se ction 564(b)(1) of the Act, 21 U.S.C. section 360bbb-3(b)(1), unless the authorization is terminated or revoked sooner.  Performed at [facility], [place] [address] [place], [place] [address]   Surgical pcr screen     Status: None   Collection Time: 03/28/21  5:38 PM   Specimen: Nasal Mucosa; Nasal Swab  Result Value Ref Range Status   MRSA, PCR NEGATIVE NEGATIVE Final   Staphylococcus aureus NEGATIVE NEGATIVE Final    Comment: (NOTE) The Xpert SA Assay (FDA approved for NASAL specimens in patients 75 years of age and older), is one component of a comprehensive surveillance program. It is not intended to diagnose infection nor to guide or monitor treatment. Performed at [facility], [place] [address] [place], [place] [address]      Labs: BNP (last 3 results) No results for input(s): BNP in the last 8760 hours. Basic Metabolic Panel: Recent Labs  Lab 03/29/21 0214 03/30/21 0735 03/31/21 0219 04/01/21 0308  NA 130* 132* 131* 130*  K 3.9 3.8 4.3 3.7  CL 101 101 99 98  CO2 24 25 26 23   GLUCOSE 81 89 137* 91  BUN 14 10 12 16   CREATININE 0.73 0.59 0.62 0.62  CALCIUM 8.3* 8.4* 8.9 8.7*   Liver Function Tests: No results for input(s): AST, ALT, ALKPHOS, BILITOT, PROT, ALBUMIN in the last 168 hours. No results for input(s): LIPASE, AMYLASE in the last 168 hours. No results for input(s): AMMONIA in the last 168 hours. CBC: Recent Labs  Lab 03/29/21 0214 03/30/21 0353 03/31/21 0219 04/01/21 0308   WBC 8.0 6.1 5.1 7.1  HGB 11.1* 11.7* 11.0* 10.7*  HCT 33.0* 35.7* 33.3* 31.8*  MCV  89.7 89.7 90.5 89.6  PLT 211 252 241 250   Cardiac Enzymes: No results for input(s): CKTOTAL, CKMB, CKMBINDEX, TROPONINI in the last 168 hours. BNP: Invalid input(s): POCBNP CBG: No results for input(s): GLUCAP in the last 168 hours. D-Dimer No results for input(s): DDIMER in the last 72 hours. Hgb A1c No results for input(s): HGBA1C in the last 72 hours. Lipid Profile No results for input(s): CHOL, HDL, LDLCALC, TRIG, CHOLHDL, LDLDIRECT in the last 72 hours. Thyroid function studies No results for input(s): TSH, T4TOTAL, T3FREE, THYROIDAB in the last 72 hours.  Invalid input(s): FREET3 Anemia work up No results for input(s): VITAMINB12, FOLATE, FERRITIN, TIBC, IRON, RETICCTPCT in the last 72 hours. Urinalysis    Component Value Date/Time   COLORURINE YELLOW 11/26/2018 [place] 11/26/2018 1634   LABSPEC 1.005 11/26/2018 1634   PHURINE 7.0 11/26/2018 1634   GLUCOSEU NEGATIVE 11/26/2018 1634   HGBUR NEGATIVE 11/26/2018 1634   BILIRUBINUR NEGATIVE 11/26/2018 1634   KETONESUR NEGATIVE 11/26/2018 1634   PROTEINUR NEGATIVE 11/26/2018 1634   UROBILINOGEN 0.2 06/09/2015 2120   NITRITE NEGATIVE 11/26/2018 1634   LEUKOCYTESUR NEGATIVE 11/26/2018 1634   Sepsis Labs Invalid input(s): PROCALCITONIN,  WBC,  LACTICIDVEN Microbiology Recent Results (from the past 240 hour(s))  SARS CORONAVIRUS 2 (TAT 6-24 HRS) Nasopharyngeal Nasopharyngeal Swab     Status: None   Collection Time: 03/28/21  5:37 PM   Specimen: Nasopharyngeal Swab  Result Value Ref Range Status   SARS Coronavirus 2 NEGATIVE NEGATIVE Final    Comment: (NOTE) SARS-CoV-2 target nucleic acids are NOT DETECTED.  The SARS-CoV-2 RNA is generally detectable in upper and lower respiratory specimens during the acute phase of infection. Negative results do not preclude SARS-CoV-2 infection, do not rule out co-infections  with other pathogens, and should not be used as the sole basis for treatment or other patient management decisions. Negative results must be combined with clinical observations, patient history, and epidemiological information. The expected result is Negative.  Fact Sheet for Patients: SugarRoll.be  Fact Sheet for Healthcare Providers: https://www.woods-mathews.com/  This test is not yet approved or cleared by the [place] FDA and  has been authorized for detection and/or diagnosis of SARS-CoV-2 by FDA under an Emergency Use Authorization (EUA). This EUA will remain  in effect (meaning this test can be used) for the duration of the COVID-19 declaration under Se ction 564(b)(1) of the Act, 21 U.S.C. section 360bbb-3(b)(1), unless the authorization is terminated or revoked sooner.  Performed at [facility], [place] [address] [place], [place] [address]   Surgical pcr screen     Status: None   Collection Time: 03/28/21  5:38 PM   Specimen: Nasal Mucosa; Nasal Swab  Result Value Ref Range Status   MRSA, PCR NEGATIVE NEGATIVE Final   Staphylococcus aureus NEGATIVE NEGATIVE Final    Comment: (NOTE) The Xpert SA Assay (FDA approved for NASAL specimens in patients 51 years of age and older), is one component of a comprehensive surveillance program. It is not intended to diagnose infection nor to guide or monitor treatment. Performed at [facility], [place] [address] [place], [place] [address]      Time coordinating discharge: 35 minutes  SIGNED:   Cordelia Poche, MD [facility] 04/02/2021, 11:24 AM

## 2021-04-03 DIAGNOSIS — E8809 Other disorders of plasma-protein metabolism, not elsewhere classified: Secondary | ICD-10-CM

## 2021-04-03 DIAGNOSIS — I480 Paroxysmal atrial fibrillation: Secondary | ICD-10-CM

## 2021-04-03 DIAGNOSIS — E871 Hypo-osmolality and hyponatremia: Secondary | ICD-10-CM

## 2021-04-03 LAB — COMPREHENSIVE METABOLIC PANEL
ALT: 15 U/L (ref 0–44)
AST: 29 U/L (ref 15–41)
Albumin: 2.8 g/dL — ABNORMAL LOW (ref 3.5–5.0)
Alkaline Phosphatase: 56 U/L (ref 38–126)
Anion gap: 7 (ref 5–15)
BUN: 9 mg/dL (ref 8–23)
CO2: 25 mmol/L (ref 22–32)
Calcium: 8.7 mg/dL — ABNORMAL LOW (ref 8.9–10.3)
Chloride: 100 mmol/L (ref 98–111)
Creatinine, Ser: 0.62 mg/dL (ref 0.44–1.00)
GFR, Estimated: >60 mL/min (ref >60)
Glucose, Bld: 97 mg/dL (ref 70–99)
Potassium: 3.9 mmol/L (ref 3.5–5.1)
Sodium: 132 mmol/L — ABNORMAL LOW (ref 135–145)
Total Bilirubin: 0.6 mg/dL (ref 0.3–1.2)
Total Protein: 6 g/dL — ABNORMAL LOW (ref 6.5–8.1)

## 2021-04-03 LAB — CBC WITH DIFFERENTIAL/PLATELET
Abs Immature Granulocytes: 0.04 10*3/uL (ref 0.00–0.07)
Basophils Absolute: 0.1 10*3/uL (ref 0.0–0.1)
Basophils Relative: 1 %
Eosinophils Absolute: 0.2 10*3/uL (ref 0.0–0.5)
Eosinophils Relative: 3 %
HCT: 33.3 % — ABNORMAL LOW (ref 36.0–46.0)
Hemoglobin: 11.4 g/dL — ABNORMAL LOW (ref 12.0–15.0)
Immature Granulocytes: 1 %
Lymphocytes Relative: 20 %
Lymphs Abs: 1.3 10*3/uL (ref 0.7–4.0)
MCH: 30.2 pg (ref 26.0–34.0)
MCHC: 34.2 g/dL (ref 30.0–36.0)
MCV: 88.1 fL (ref 80.0–100.0)
Monocytes Absolute: 0.7 10*3/uL (ref 0.1–1.0)
Monocytes Relative: 11 %
Neutro Abs: 4.1 10*3/uL (ref 1.7–7.7)
Neutrophils Relative %: 64 %
Platelets: 301 10*3/uL (ref 150–400)
RBC: 3.78 MIL/uL — ABNORMAL LOW (ref 3.87–5.11)
RDW: 15.3 % (ref 11.5–15.5)
WBC: 6.4 10*3/uL (ref 4.0–10.5)
nRBC: 0 % (ref 0.0–0.2)

## 2021-04-03 MED ORDER — ENSURE ENLIVE PO LIQD
237.0000 mL | Freq: Two times a day (BID) | ORAL | Status: DC
  Administered 2021-04-03 – 2021-04-15 (×17): 237 mL via ORAL

## 2021-04-03 MED ORDER — CHOLECALCIFEROL 10 MCG (400 UNIT) PO TABS
400.0000 [IU] | ORAL_TABLET | Freq: Every day | ORAL | Status: DC
  Administered 2021-04-03 – 2021-04-15 (×13): 400 [IU] via ORAL
  Filled 2021-04-03 (×13): qty 1

## 2021-04-03 MED ORDER — CALCIUM CARBONATE 1250 (500 CA) MG PO TABS
1.0000 | ORAL_TABLET | Freq: Two times a day (BID) | ORAL | Status: DC
  Administered 2021-04-03 – 2021-04-04 (×2): 500 mg via ORAL
  Filled 2021-04-03 (×2): qty 1

## 2021-04-03 NOTE — IPOC Note (Signed)
Overall Plan of Care [facility]) Patient Details Name: Maria [name] MRN: [id number] DOB: Jan 22, 1924  Admitting Diagnosis: Femur fracture, left [facility])  Hospital Problems: Principal Problem:   Femur fracture, left ([place])     Functional Problem List: Nursing Bladder, Bowel, Medication Management, Safety, Pain, Endurance  PT Balance, Endurance, Motor, Sensory, Safety, Pain  OT Balance, Endurance, Motor, Pain, Safety, Sensory  SLP    TR         Basic ADL's: OT Grooming, Bathing, Dressing, Toileting     Advanced  ADL's: OT Simple Meal Preparation, Laundry     Transfers: PT Bed Mobility, Bed to Chair, Car, Manufacturing systems engineer, Metallurgist: PT Ambulation, Data processing manager, Emergency planning/management officer     Additional Impairments: OT None  SLP        TR      Anticipated Outcomes Item Anticipated Outcome  Self Feeding mod I  Swallowing      Basic self-care  mod I-supervision  Toileting  mod I-supervision   Bathroom Transfers mod I-supervision  Bowel/Bladder  manage bowel with mod I assist and bladder with mod I assist  Transfers  mod I  Locomotion  Supervision  Communication     Cognition     Pain  at or below level 4  Safety/Judgment  maintain safety wtih cues/reminders   Therapy Plan: PT Intensity: Minimum of 1-2 x/day ,45 to 90 minutes PT Frequency: 5 out of 7 days PT Duration Estimated Length of Stay: 7-9 days OT Intensity: Minimum of 1-2 x/day, 45 to 90 minutes OT Frequency: 5 out of 7 days OT Duration/Estimated Length of Stay: 7-10 days     Due to the current state of emergency, patients may not be receiving their 3-hours of Medicare-mandated therapy.   Team Interventions: Nursing Interventions Bladder Management, Patient/Family Education, Bowel Management, Pain Management, Medication Management, Disease Management/Prevention, Discharge Planning  PT interventions Ambulation/gait training, Discharge planning, DME/adaptive equipment instruction,  Functional mobility training, Pain management, Splinting/orthotics, Therapeutic Activities, UE/LE Strength taining/ROM, Wheelchair propulsion/positioning, Therapeutic Exercise, Stair training, Patient/family education, Neuromuscular re-education, Disease management/prevention, Training and development officer  OT Interventions Balance/vestibular training, Discharge planning, Pain management, Self Care/advanced ADL retraining, Therapeutic Activities, Functional mobility training, Patient/family education, Therapeutic Exercise, Community reintegration, DME/adaptive equipment instruction, Psychosocial support, UE/LE Strength taining/ROM  SLP Interventions    TR Interventions    SW/CM Interventions Discharge Planning, Psychosocial Support, Patient/Family Education   Barriers to Discharge MD  Medical stability  Nursing Decreased caregiver support level entry independent living facility ([facility], shopping, etc PTA  PT Decreased caregiver support family to work out staying with pt  OT Decreased caregiver support Lives alone in [place] and wants to return, concerned she will need more assistance preventing return to independent living  SLP      SW       Team Discharge Planning: Destination: PT-Home ,OT- Home ([place]) , SLP-  Projected Follow-up: PT-Home health PT, OT-  Home health OT, SLP-  Projected Equipment Needs: PT-None recommended by PT, OT- 3 in 1 bedside comode, Tub/shower bench, Tub/shower seat, To be determined, SLP-  Equipment Details: PT- , OT-  Patient/family involved in discharge planning: PT- Patient,  OT-Patient, SLP-   MD ELOS: 7-10 days Medical Rehab Prognosis:  Excellent Assessment: The patient has been admitted for CIR therapies with the diagnosis of subcapital left femur fx. The team will be addressing functional mobility, strength, stamina, balance, safety, adaptive techniques and equipment, self-care, bowel and bladder mgt, patient and caregiver  education,  pain mgt, community reentry. Goals have been set at mod I to supervision with self-care and mobility. Pt is very motivated!.   Due to the current state of emergency, patients may not be receiving their 3 hours per day of Medicare-mandated therapy.    Meredith Staggers, MD, FAAPMR     See Team Conference Notes for weekly updates to the plan of care

## 2021-04-03 NOTE — Evaluation (Signed)
Occupational Therapy Assessment and Plan  Patient Details  Name: Maria [name] MRN: [id number] Date of Birth: 28-Apr-1924  OT Diagnosis: acute pain, muscle weakness (generalized), and pain in joint Rehab Potential: Rehab Potential (ACUTE ONLY): Good ELOS: 7-10 days   Today's Date: 04/03/2021 OT Individual Time: 0800-0903 OT Individual Time Calculation (min): 63 min     Hospital Problem: Principal Problem:   Femur fracture, left ([place])   Past Medical History:  Past Medical History:  Diagnosis Date   Anemia    Atonic bladder 12/11/2013   Carotid artery occlusion    right carotid stenosis 40-60%, 4/08, neg for stenosis repeat study 11/11   Celiac disease    Chronic anticoagulation CHADS2VASC2 score 7 10/18/2014   Chronic diastolic CHF (congestive heart failure) (HCC)    takes Lasix daily   CKD (chronic kidney disease), stage III ([place])    Coronary artery disease    a.  08/2013 (LIMA-LAD, rSVG to LCX and Lt atrial clip),   Depression    DNR (do not resuscitate) 03/28/2021   DVT (deep venous thrombosis) ([place]) 09/2013   Gastric ulcer    6-45month ago   GERD (gastroesophageal reflux disease)    takes Omeprazole daily   GI bleeding    Hemorrhoids    Hot flashes    takes ZOloft 3 times a week   Hypertension    Hypothyroidism    takes Synthroid daily as result of AMiodarone   Insomnia    Macular degeneration    Mild aortic insufficiency    a. Echo 10/2013: mild AI.   Moderate mitral regurgitation by prior echocardiogram    a. Echo 10/2013.   Moderate obstructive sleep apnea    uses CPAP;sleep study about 4-5768monthago   MVP (mitral valve prolapse)    Osteoporosis    PAF (paroxysmal atrial fibrillation) (HCC)    Peripheral edema    left   Presbycusis    Pulmonary hypertension (HCC)    RLS (restless legs syndrome)    Thyroid nodule    68m60mno change 11/11   Urethral stricture    Urinary anastomotic stricture    Urinary retention    Vitamin D deficiency    Past  Surgical History:  Past Surgical History:  Procedure Laterality Date   APPENDECTOMY     bilateral cataract surgery     CARDIAC CATHETERIZATION  08-06-13   CORONARY ARTERY BYPASS GRAFT N/A 08/28/2013   Procedure: CORONARY ARTERY BYPASS GRAFTING (CABG) x2 using right greater saphenous vein and left internal mammary artery. ;  Surgeon: EdwGrace IsaacD;  [facility];  Laterality: N/A;   HIP ARTHROPLASTY Right 12/09/2013   Procedure: ARTHROPLASTY BIPOLAR HIP CEMENTED;  Surgeon: FraKerin SalenD;  [facility];  Laterality: Right;   HIP PINNING,CANNULATED Left 03/30/2021   Procedure: CANNULATED HIP PINNING;  Surgeon: LanMarchia BondD;  [facility];  Laterality: Left;   INTRAOPERATIVE TRANSESOPHAGEAL ECHOCARDIOGRAM N/A 08/28/2013   Procedure: INTRAOPERATIVE TRANSESOPHAGEAL ECHOCARDIOGRAM;  Surgeon: EdwGrace IsaacD;  [facility] [place];  Laterality: N/A;   LEFT AND RIGHT HEART CATHETERIZATION WITH CORONARY ANGIOGRAM N/A 08/16/2013   Procedure: LEFT AND RIGHT HEART CATHETERIZATION WITH CORONARY ANGIOGRAM;  Surgeon: HenSinclair GroomsD;  [facility];  Service: Cardiovascular;  Laterality: N/A;   MANDIBLE SURGERY     TCS     TONSILLECTOMY     trigger  thumb     TUBAL LIGATION      Assessment & Plan Clinical Impression: Patient is a 87 year old female with history of CAD, CKD III, Celiac disease, COPD, atonic bladder, PAF- on Eliquis who sustained a fall with nondisplaced left basicervical femur fracture while at [facility] and was transferred back to [facility] on 03/28/21 for management. She was transitioned to IV heparin and underwent left hip pinning by Dr. Mardelle Matte on 06/06. Post op WBAT and Eliquis resumed the next day.  Hospital course significant for ABLA and stable hyponatremia. Patient transferred to CIR on 04/02/2021 .    Patient currently requires min A (max LB/LLE)  with basic self-care skills and IADL secondary to muscle weakness and muscle joint tightness, decreased cardiorespiratoy endurance, and decreased standing balance and decreased balance strategies.  Prior to hospitalization, patient could complete ADLs/simple iADLs at independent level.  Patient will benefit from skilled intervention to decrease level of assist with basic self-care skills, increase independence with basic self-care skills, and increase level of independence with iADL prior to discharge home independently with possible care giver assistance provided from ILF/ALF.  Anticipate patient will require intermittent supervision and follow up home health.  OT - End of Session Activity Tolerance: Tolerates 30+ min activity with multiple rests Endurance Deficit: Yes Endurance Deficit Description: fatigues wtih ambulation and standing tasks due to pain  in L hip OT Assessment Rehab Potential (ACUTE ONLY): Good OT Barriers to Discharge: Decreased caregiver support OT Barriers to Discharge Comments: Lives alone in ILF and wants to return, concerned she will need more assistance preventing return to independent living OT Patient demonstrates impairments in the following area(s): Balance;Endurance;Motor;Pain;Safety;Sensory OT Basic ADL's Functional Problem(s): Grooming;Bathing;Dressing;Toileting OT Advanced ADL's Functional Problem(s): Simple Meal Preparation;Laundry OT Transfers Functional Problem(s): Toilet;Tub/Shower OT Additional Impairment(s): None OT Plan OT Intensity: Minimum of 1-2 x/day, 45 to 90 minutes OT Frequency: 5 out of 7 days OT Duration/Estimated Length of Stay: 7-10 days OT Treatment/Interventions: Balance/vestibular training;Discharge planning;Pain management;Self Care/advanced ADL retraining;Therapeutic Activities;Functional mobility training;Patient/family education;Therapeutic Exercise;Community reintegration;DME/adaptive equipment instruction;Psychosocial support;UE/LE  Strength taining/ROM OT Self Feeding Anticipated Outcome(s): mod I OT Basic Self-Care Anticipated Outcome(s): mod I-supervision OT Toileting Anticipated Outcome(s): mod I-supervision OT Bathroom Transfers Anticipated Outcome(s): mod I-supervision OT Recommendation Recommendations for Other Services: Therapeutic Recreation consult Therapeutic Recreation Interventions: Kitchen group;Pet therapy;Outing/community reintergration (pt still drives 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) Patient destination: Home ([place]) Follow Up Recommendations: Home health OT Equipment Recommended: 3 in 1 bedside comode;Tub/shower bench;Tub/shower seat;To be determined   OT Evaluation Precautions/Restrictions  Precautions Precautions: Fall Restrictions Weight Bearing Restrictions: No LLE Weight Bearing: Weight bearing as tolerated General Chart Reviewed: Yes Response to Previous Treatment: Patient reporting fatigue but able to participate Family/Caregiver Present: No Vital Signs  Pain Pain Assessment Pain Scale: 0-10 Pain Score: 5  Pain Type: Acute pain Pain Location: Hip Pain Orientation: Left Pain Descriptors / Indicators: Aching Pain Onset: With Activity Pain Intervention(s): Distraction;Repositioned;Rest Multiple Pain Sites: No Home Living/Prior Functioning Home Living Family/patient expects to be discharged to:: Assisted living Living Arrangements: Alone Available Help at Discharge: Family, Other (Comment) (Likely able to receive ALF assistance) Type of Home: Independent living facility Home Access: Level entry Home Layout: One level Bathroom Shower/Tub: Walk-in shower, Other (comment) (with threshold) Bathroom Toilet: Standard Bathroom Accessibility: Yes  Lives With: Alone IADL History Homemaking Responsibilities: Yes Shopping Responsibility: Primary Homemaking Comments: Staff at [employer] and cooks meals daily; pt reports cooking 2-3 meals/week in apt Current  License: Yes Mode  of Transportation: Car Prior Function Level of Independence: Independent with basic ADLs, Independent with transfers, Independent with gait Driving: Yes 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) Leisure: Hobbies-yes (Comment) Comments: likes to root plants and reads and does some sewing Vision Baseline Vision/History: Wears glasses Wears Glasses: Reading only Patient Visual Report: No change from baseline Vision Assessment?: No apparent visual deficits Perception  Perception: Within Functional Limits Praxis Praxis: Intact Cognition Overall Cognitive Status: Within Functional Limits for tasks assessed Arousal/Alertness: Awake/alert Orientation Level: Person;Place;Situation Person: Oriented Place: Oriented Situation: Oriented Year: 2022 Month: June Day of Week: Incorrect (Thought it was Tuesday) Memory: Appears intact Immediate Memory Recall: Sock;Blue;Bed Memory Recall Sock: Without Cue Memory Recall Blue: Without Cue Memory Recall Bed: Without Cue Attention: Sustained;Divided Sustained Attention: Appears intact Divided Attention: Appears intact Awareness: Appears intact Problem Solving: Appears intact Executive Function: Reasoning Reasoning: Appears intact Safety/Judgment: Appears intact Sensation Sensation Light Touch: Impaired Detail Peripheral sensation comments: decresaed due to neuropathy, especially in LLE Light Touch Impaired Details: Impaired RLE;Impaired LLE Hot/Cold: Appears Intact Proprioception: Appears Intact Stereognosis: Appears Intact Coordination Gross Motor Movements are Fluid and Coordinated: No Fine Motor Movements are Fluid and Coordinated: Yes Coordination and Movement Description: limited L LE due to pain, decreased AROM Motor  Motor Motor: Other (comment) Motor - Skilled Clinical Observations: generalized weakness, pain L LE  Trunk/Postural Assessment  Cervical Assessment Cervical Assessment: Within  Functional Limits Thoracic Assessment Thoracic Assessment: Exceptions to WFL (kyphosis) Lumbar Assessment Lumbar Assessment: Within Functional Limits Postural Control Postural Control: Within Functional Limits  Balance Balance Balance Assessed: Yes Dynamic Sitting Balance Dynamic Sitting - Balance Support: No upper extremity supported;Feet supported;During functional activity Dynamic Sitting - Level of Assistance: 5: Stand by assistance Dynamic Sitting - Balance Activities: Lateral lean/weight shifting;Forward lean/weight shifting;Reaching for objects;Reaching across midline Static Standing Balance Static Standing - Balance Support: Bilateral upper extremity supported;Right upper extremity supported;Left upper extremity supported;During functional activity Static Standing - Level of Assistance: 5: Stand by assistance Dynamic Standing Balance Dynamic Standing - Balance Support: Bilateral upper extremity supported;During functional activity Dynamic Standing - Level of Assistance: 4: Min assist Dynamic Standing - Balance Activities: Lateral lean/weight shifting;Forward lean/weight shifting;Reaching for objects Extremity/Trunk Assessment RUE Assessment RUE Assessment: Within Functional Limits General Strength Comments: WFL in functional tasks but generalized weakness LUE Assessment LUE Assessment: Within Functional Limits General Strength Comments: WFL in functional tasks but generalized weakness  Care Tool Care Tool Self Care Eating   Eating Assist Level: Set up assist    Oral Care    Oral Care Assist Level: Set up assist    Bathing   Body parts bathed by patient: Right arm;Left arm;Chest;Abdomen;Front perineal area;Buttocks;Right upper leg;Left upper leg;Right lower leg;Left lower leg;Face Body parts bathed by helper: Left lower leg   Assist Level: Minimal Assistance - Patient > 75%    Upper Body Dressing(including orthotics)   What is the patient wearing?: Pull over  shirt;Bra   Assist Level: Minimal Assistance - Patient > 75%    Lower Body Dressing (excluding footwear)   What is the patient wearing?: Underwear/pull up;Pants Assist for lower body dressing: Contact Guard/Touching assist    Putting on/Taking off footwear   What is the patient wearing?: Non-skid slipper socks Assist for footwear: Total Assistance - Patient < 25% (for L sock)       Care Tool Toileting Toileting activity         Care Tool Bed Mobility Roll left and right activity   Roll left and  right assist level: Minimal Assistance - Patient > 75%    Sit to lying activity Sit to lying activity did not occur: Safety/medical concerns Sit to lying assist level: Minimal Assistance - Patient > 75%    Lying to sitting edge of bed activity   Lying to sitting edge of bed assist level: Minimal Assistance - Patient > 75%     Care Tool Transfers Sit to stand transfer   Sit to stand assist level: Minimal Assistance - Patient > 75%    Chair/bed transfer   Chair/bed transfer assist level: Minimal Assistance - Patient > 75%     Toilet transfer         Care Tool Cognition Expression of Ideas and Wants Expression of Ideas and Wants: Without difficulty (complex and basic) - expresses complex messages without difficulty and with speech that is clear and easy to understand   Understanding Verbal and Non-Verbal Content Understanding Verbal and Non-Verbal Content: Understands (complex and basic) - clear comprehension without cues or repetitions   Memory/Recall Ability *first 3 days only Memory/Recall Ability *first 3 days only: Current season;Location of own room;Staff names and faces;That he or she is in a hospital/hospital unit    Refer to Care Plan for [place] 1 OT Short Term Goal 1 (Week 1): Patient will complete LB bathing/dressing with close spvsn using AE PRN. OT Short Term Goal 2 (Week 1): Patient will complete 3/3 toileting tasks with close spvsn  and LRAD. OT Short Term Goal 3 (Week 1): Patient will complete toilet transfer with close spvsn and LRAD. OT Short Term Goal 4 (Week 1): Patient will complete dynamic standing ADL using LRAD and no overt LOB reaching outside BOS. OT Short Term Goal 5 (Week 1): Patient will identify 3 fall prevention strategies to improve safety at home with no cueing.  Recommendations for other services: Therapeutic Recreation  Pet therapy, Kitchen group, and Outing/community reintegration   Skilled Therapeutic Intervention Pt received supine in bed, eating breakfast, agreeable to OT eval and reporting no pain when sitting still. Pt is friendly and receptive to OT; reported challenges with nutrition due to gluten allergy. Education on role of OT and rehab process with pt demoing good understanding; pt provided PLF and engaged in eval as noted above. Pt completed ADLs and mobility as below during session. Required min vc's for safe use of DME/RW management during functional transfers. While standing, pt demo'd decreased static and dynamic standing balance and reported feeling "very unsteady"; however, pt ambulated with min A-CGA bed<>chair at sink. Education provided on dressing LLE first while seated due to pain - but pt able to flex BLEs to reach in seated position well. Pt reported increased L hip pain during functional mobility (see below). Pt declined shower and requested to bathe at sink due to unsteadiness and pain. Pt remained supine in bed following eval session, set up for oral hygiene, alarm set, call bell in reach, and all needs met.  ADL ADL Eating: Set up Where Assessed-Eating: Wheelchair;Bed level Grooming: Setup Where Assessed-Grooming: Sitting at sink;Wheelchair;Bed level Upper Body Bathing: Setup Where Assessed-Upper Body Bathing: Sitting at sink;Wheelchair Lower Body Bathing: Minimal assistance;Contact guard Where Assessed-Lower Body Bathing: Standing at sink;Sitting at sink;Wheelchair Upper  Body Dressing: Minimal assistance Where Assessed-Upper Body Dressing: Sitting at sink;Wheelchair Lower Body Dressing: Contact guard (L sock requires total A) Where Assessed-Lower Body Dressing: Standing at sink;Sitting at sink;Wheelchair Toileting: Minimal assistance (per pt report using BSC in room) Where Assessed-Toileting:  Bedside Commode Toilet Transfer: Minimal assistance Toilet Transfer Method: Stand pivot Toilet Transfer Equipment: Sport and exercise psychologist Transfer: Not assessed;Other (comment) (Pt requesting wash up at sink at eval) Mobility  Bed Mobility Bed Mobility: Supine to Sit;Sit to Supine;Scooting to HOB Supine to Sit: Minimal Assistance - Patient > 75% Sit to Supine: Minimal Assistance - Patient > 75% Scooting to HOB: Minimal Assistance - Patient > 75% Transfers Sit to Stand: Minimal Assistance - Patient > 75% Stand to Sit: Minimal Assistance - Patient > 75%   Discharge Criteria: Patient will be discharged from OT if patient refuses treatment 3 consecutive times without medical reason, if treatment goals not met, if there is a change in medical status, if patient makes no progress towards goals or if patient is discharged from hospital.  The above assessment, treatment plan, treatment alternatives and goals were discussed and mutually agreed upon: by patient  Mellissa Kohut 04/03/2021, 2:37 PM

## 2021-04-03 NOTE — Progress Notes (Signed)
Inpatient Rehabilitation  Patient information reviewed and entered into eRehab system by Gracen Ringwald Anadalay Macdonell, OTR/L.   Information including medical coding, functional ability and quality indicators will be reviewed and updated through discharge.    

## 2021-04-03 NOTE — Evaluation (Signed)
Physical Therapy Assessment and Plan  Patient Details  Name: Maria [name] MRN: [id number] Date of Birth: [birth year]  PT Diagnosis: Abnormality of gait, Difficulty walking, Muscle spasms, Muscle weakness, and Pain in joint Rehab Potential: Good ELOS: 7-9 days   Today's Date: 04/03/2021 PT Individual Time: 1045-1200 PT Individual Time Calculation (min): 75 min    Hospital Problem: Principal Problem:   Femur fracture, left ([place])   Past Medical History:  Past Medical History:  Diagnosis Date   Anemia    Atonic bladder 12/11/2013   Carotid artery occlusion    right carotid stenosis 40-60%, 4/08, neg for stenosis repeat study 11/11   Celiac disease    Chronic anticoagulation CHADS2VASC2 score 7 10/18/2014   Chronic diastolic CHF (congestive heart failure) (HCC)    takes Lasix daily   CKD (chronic kidney disease), stage III ([place])    Coronary artery disease    a.  08/2013 (LIMA-LAD, rSVG to LCX and Lt atrial clip),   Depression    DNR (do not resuscitate) 03/28/2021   DVT (deep venous thrombosis) ([place]) 09/2013   Gastric ulcer    6-62month ago   GERD (gastroesophageal reflux disease)    takes Omeprazole daily   GI bleeding    Hemorrhoids    Hot flashes    takes ZOloft 3 times a week   Hypertension    Hypothyroidism    takes Synthroid daily as result of AMiodarone   Insomnia    Macular degeneration    Mild aortic insufficiency    a. Echo 10/2013: mild AI.   Moderate mitral regurgitation by prior echocardiogram    a. Echo 10/2013.   Moderate obstructive sleep apnea    uses CPAP;sleep study about 4-533monthago   MVP (mitral valve prolapse)    Osteoporosis    PAF (paroxysmal atrial fibrillation) (HCC)    Peripheral edema    left   Presbycusis    Pulmonary hypertension (HCC)    RLS (restless legs syndrome)    Thyroid nodule    43m84mno change 11/11   Urethral stricture    Urinary anastomotic stricture    Urinary retention    Vitamin D deficiency    Past Surgical  History:  Past Surgical History:  Procedure Laterality Date   APPENDECTOMY     bilateral cataract surgery     CARDIAC CATHETERIZATION  08-06-13   CORONARY ARTERY BYPASS GRAFT N/A 08/28/2013   Procedure: CORONARY ARTERY BYPASS GRAFTING (CABG) x2 using right greater saphenous vein and left internal mammary artery. ;  Surgeon: EdwGrace IsaacD;  [facility];  Laterality: N/A;   HIP ARTHROPLASTY Right 12/09/2013   Procedure: ARTHROPLASTY BIPOLAR HIP CEMENTED;  Surgeon: FraKerin SalenD;  [facility];  Laterality: Right;   HIP PINNING,CANNULATED Left 03/30/2021   Procedure: CANNULATED HIP PINNING;  Surgeon: LanMarchia BondD;  [facility];  Laterality: Left;   INTRAOPERATIVE TRANSESOPHAGEAL ECHOCARDIOGRAM N/A 08/28/2013   Procedure: INTRAOPERATIVE TRANSESOPHAGEAL ECHOCARDIOGRAM;  Surgeon: EdwGrace IsaacD;  [facility];  Laterality: N/A;   LEFT AND RIGHT HEART CATHETERIZATION WITH CORONARY ANGIOGRAM N/A 08/16/2013   Procedure: LEFT AND RIGHT HEART CATHETERIZATION WITH CORONARY ANGIOGRAM;  Surgeon: HenSinclair GroomsD;  [facility];  Service: Cardiovascular;  Laterality: N/A;   MANDIBLE SURGERY     TCS     TONSILLECTOMY     trigger thumb  TUBAL LIGATION      Assessment & Plan Clinical Impression: Maria [name] is a 87 year old female with history of CAD, CKD III, Celiac disease, COPD, atonic bladder, PAF- on Eliquis who sustained a fall with nondisplaced left basicervical femur fracture while at [facility] and was transferred back to [facility] on 03/28/21 for management. She was transitioned to IV heparin and underwent left hip pinning by Dr. Mardelle Matte on 06/06. Post op WBAT and Eliquis resumed the next day.  Hospital course significant for ABLA and stable hyponatremia. Therapy ongoing and patient continues to be limited by pain and weakness affecting  mobility and ADLs. CIR recommended due to functional decline. Patient transferred to CIR on 04/02/2021 .   Patient currently requires min with mobility secondary to muscle weakness and Pain and decreased standing balance and decreased balance strategies.  Prior to hospitalization, patient was independent  with mobility and lived with   in a Independent living facility home.  Home access is  Level entry.  Patient will benefit from skilled PT intervention to maximize safe functional mobility, minimize fall risk, and decrease caregiver burden for planned discharge home with 24 hour supervision.  Anticipate patient will benefit from follow up [place] at discharge.  PT - End of Session Activity Tolerance: Improving;Tolerates 30+ min activity with multiple rests Endurance Deficit: Yes Endurance Deficit Description: fatigues wtih ambulation ~100' with pain PT Assessment Rehab Potential (ACUTE/IP ONLY): Good PT Barriers to Discharge: Decreased caregiver support PT Barriers to Discharge Comments: family to work out staying with pt PT Patient demonstrates impairments in the following area(s): Balance;Endurance;Motor;Sensory;Safety;Pain PT Transfers Functional Problem(s): Bed Mobility;Bed to Chair;Car;Furniture PT Locomotion Functional Problem(s): Ambulation;Stairs;Wheelchair Mobility PT Plan PT Intensity: Minimum of 1-2 x/day ,45 to 90 minutes PT Frequency: 5 out of 7 days PT Duration Estimated Length of Stay: 7-9 days PT Treatment/Interventions: Ambulation/gait training;Discharge planning;DME/adaptive equipment instruction;Functional mobility training;Pain management;Splinting/orthotics;Therapeutic Activities;UE/LE Strength taining/ROM;Wheelchair propulsion/positioning;Therapeutic Exercise;Stair training;Patient/family education;Neuromuscular re-education;Disease management/prevention;Balance/vestibular training PT Transfers Anticipated Outcome(s): mod I PT Locomotion Anticipated Outcome(s): Supervision PT  Recommendation Recommendations for Other Services: Therapeutic Recreation consult Therapeutic Recreation Interventions: Kitchen group Follow Up Recommendations: Home health PT Patient destination: Home Equipment Recommended: None recommended by PT   PT Evaluation Precautions/Restrictions Precautions Precautions: Fall Restrictions LLE Weight Bearing: Weight bearing as tolerated Pain Pain Assessment Pain Score: 4  Pain Type: Acute pain Pain Location: Hip Pain Orientation: Left Pain Descriptors / Indicators: Aching Pain Onset: With Activity Pain Intervention(s): Repositioned Home Living/Prior Functioning Home Living Living Arrangements: Alone Available Help at Discharge: Family (has two daughters and one son who will figure out how to get help at d/c if needed and may trade off) Type of Home: Independent living facility Home Access: Level entry Home Layout: One level Bathroom Shower/Tub: Walk-in shower (has grabbars in shower and built in Astronomer) Bathroom Toilet: Standard (has grabbar beside commode) Prior Function Level of Independence: Independent with basic ADLs;Independent with transfers;Independent with gait (has help for cleaning) Driving: Yes Leisure: Hobbies-yes (Comment) Comments: likes to root plants and reads and does some sewing Vision/Perception  Perception Perception: Within Functional Limits Praxis Praxis: Intact  Cognition Overall Cognitive Status: Within Functional Limits for tasks assessed Arousal/Alertness: Awake/alert Orientation Level: Oriented X4 Memory: Appears intact Safety/Judgment: Appears intact Sensation Sensation Light Touch: Impaired Detail Peripheral sensation comments: decresaed due to neuropathy Light Touch Impaired Details: Impaired RLE;Impaired LLE Coordination Gross Motor Movements are Fluid and Coordinated: No Coordination and Movement Description: limited L LE due to pain, decreased AROM Motor  Motor Motor:  Other  (comment) Motor - Skilled Clinical Observations: generalized weakness, pain L LE   Trunk/Postural Assessment  Cervical Assessment Cervical Assessment: Within Functional Limits Thoracic Assessment Thoracic Assessment: Exceptions to WFL (kyphosis) Lumbar Assessment Lumbar Assessment: Within Functional Limits Postural Control Postural Control: Within Functional Limits  Balance Balance Balance Assessed: Yes Dynamic Sitting Balance Dynamic Sitting - Balance Support: Right upper extremity supported;Left upper extremity supported Dynamic Sitting - Level of Assistance: 5: Stand by assistance Dynamic Sitting - Balance Activities: Lateral lean/weight shifting;Forward lean/weight shifting Static Standing Balance Static Standing - Balance Support: Bilateral upper extremity supported;Right upper extremity supported;Left upper extremity supported Static Standing - Level of Assistance: 5: Stand by assistance (CGA) Dynamic Standing Balance Dynamic Standing - Balance Support: Bilateral upper extremity supported Dynamic Standing - Level of Assistance: 4: Min assist Dynamic Standing - Balance Activities: Lateral lean/weight shifting;Forward lean/weight shifting Extremity Assessment      RLE Assessment RLE Assessment: Exceptions to [facility] Active Range of Motion (AROM) Comments: WFL General Strength Comments: hip flexion 4-/5 due to pain on L side, otherwise WFL LLE Assessment LLE Assessment: Exceptions to [facility] Active Range of Motion (AROM) Comments: Limited about 25% with hip and knee flexion due to pain General Strength Comments: Hip flexion 3-/5, knee extension 4-/5, ankle DF 4/5  Care Tool Care Tool Bed Mobility Roll left and right activity   Roll left and right assist level: Minimal Assistance - Patient > 75%    Sit to lying activity Sit to lying activity did not occur: Safety/medical concerns      Lying to sitting edge of bed activity   Lying to sitting edge of bed assist level: Minimal  Assistance - Patient > 75%     Care Tool Transfers Sit to stand transfer   Sit to stand assist level: Minimal Assistance - Patient > 75%    Chair/bed transfer   Chair/bed transfer assist level: Minimal Assistance - Patient > 75%     Physiological scientist transfer assist level: Moderate Assistance - Patient 50 - 74%      Care Tool Locomotion Ambulation   Assist level: Minimal Assistance - Patient > 75% Assistive device: Walker-rolling Max distance: 100'  Walk 10 feet activity   Assist level: Minimal Assistance - Patient > 75% Assistive device: Walker-rolling   Walk 50 feet with 2 turns activity   Assist level: Minimal Assistance - Patient > 75% Assistive device: Walker-rolling  Walk 150 feet activity Walk 150 feet activity did not occur: Safety/medical concerns      Walk 10 feet on uneven surfaces activity   Assist level: Minimal Assistance - Patient > 75% Assistive device: Walker-rolling  Stairs   Assist level: Minimal Assistance - Patient > 75% Stairs assistive device: Walker Max number of stairs: 1  Walk up/down 1 step activity   Walk up/down 1 step (curb) assist level: Minimal Assistance - Patient > 75% Walk up/down 1 step or curb assistive device: Walker Walk up/down 4 steps activity did not occuR: Safety/medical concerns  Walk up/down 4 steps activity      Walk up/down 12 steps activity Walk up/down 12 steps activity did not occur: Safety/medical concerns      Pick up small objects from floor Pick up small object from the floor (from standing position) activity did not occur: Safety/medical concerns      Wheelchair Will patient use wheelchair at discharge?: No Type of Wheelchair: Manual   Wheelchair assist level: Supervision/Verbal  cueing Max wheelchair distance: 33'  Wheel 50 feet with 2 turns activity   Assist Level: Supervision/Verbal cueing  Wheel 150 feet activity Wheelchair 150 feet activity did not occur: Safety/medical  concerns      Refer to Care Plan for Long Term Goals  SHORT TERM GOAL WEEK 1 PT Short Term Goal 1 (Week 1): STG=LTG due to ELOS  Recommendations for other services: Therapeutic Recreation  Kitchen group and Stress management  Skilled Therapeutic Intervention Patient in supine and speaking with dietician.  Obtained cushion for w/c.  Patient supine for strength and prior funcitonal assessment.  Performed supine to sit with min A for L LE.  Patient sitting EOB unaided without UE support.  Performed sit to stand to RW with min A and cues for hand placement.  Patient ambulated ~100' with RW and min A cues for forward gaze and pt demonstrating step through technique, but still shorter step on R and limited stance time on L.  Patient propelled w/c x 60' with close S and cues.  Demonstrated using RW up one step forward and reverse.  Patient negotiated 4" curb step with RW and min A.  Patient assisted in w/c to ortho gym.  Performed ambulatory transfer to simulated small SUV height with cues and min to mod A for helping with LE management.  Patient negotiated ramp with RW and min A.  Assisted in w/c to room.  Patient requesting to sit up for lunch so left in w/c with call bell and needs in reach. Mobility Bed Mobility Bed Mobility: Supine to Sit Supine to Sit: Minimal Assistance - Patient > 75% Transfers Transfers: Sit to Stand;Stand to Sit Sit to Stand: Minimal Assistance - Patient > 75% Stand to Sit: Minimal Assistance - Patient > 75% Transfer (Assistive device): Rolling walker Locomotion  Gait Ambulation: Yes Gait Assistance: Minimal Assistance - Patient > 75% Gait Distance (Feet): 100 Feet Assistive device: Rolling walker Gait Assistance Details: Verbal cues for technique;Verbal cues for gait pattern Gait Assistance Details: lowered RW for height (obtained youth RW), patient able to perform step through technique Gait Gait: Yes Gait Pattern: Step-through pattern;Decreased step length -  right;Decreased stance time - left;Antalgic Stairs / Additional Locomotion Stairs: Yes Stairs Assistance: Minimal Assistance - Patient > 75% Stair Management Technique: With walker Number of Stairs: 1 Height of Stairs: 4 Ramp: Minimal Assistance - Patient >75% Curb: Minimal Assistance - Patient >75% Wheelchair Mobility Wheelchair Mobility: Yes Wheelchair Assistance: Chartered loss adjuster: Both upper extremities Wheelchair Parts Management: Supervision/cueing Distance: 69'   Discharge Criteria: Patient will be discharged from PT if patient refuses treatment 3 consecutive times without medical reason, if treatment goals not met, if there is a change in medical status, if patient makes no progress towards goals or if patient is discharged from hospital.  The above assessment, treatment plan, treatment alternatives and goals were discussed and mutually agreed upon: by patient  Jamison Oka, PT 04/03/2021, 12:42 PM

## 2021-04-03 NOTE — Progress Notes (Addendum)
PROGRESS NOTE   Subjective/Complaints: Left thigh/hip sore with movement, right side of neck as well. Overall slept well. No other problems. Asked if her bones were strong enough to heal just with "a rod".   ROS: Patient denies fever, rash, sore throat, blurred vision, nausea, vomiting, diarrhea, cough, shortness of breath or chest pain, headache, or mood change.    Objective:   VAS [place] LOWER EXTREMITY VENOUS (DVT)  Result Date: 04/02/2021  Lower Venous DVT Study Patient Name:  Maria [name]  Date of Exam:   04/02/2021 Medical Rec #: [id number]          Accession #:    [id number] Date of Birth: September 12, 1924          Patient Gender: F Patient Age:   33Y Exam Location:  [facility] Procedure:      VAS [place] LOWER EXTREMITY VENOUS (DVT) Referring Phys: [id number] RALPH A NETTEY --------------------------------------------------------------------------------  Indications: Edema.  Comparison Study: no prior Performing Technologist: Archie Patten RVS  Examination Guidelines: A complete evaluation includes B-mode imaging, spectral Doppler, color Doppler, and power Doppler as needed of all accessible portions of each vessel. Bilateral testing is considered an integral part of a complete examination. Limited examinations for reoccurring indications may be performed as noted. The reflux portion of the exam is performed with the patient in reverse Trendelenburg.  +---------+---------------+---------+-----------+----------+-------------------+ RIGHT    CompressibilityPhasicitySpontaneityPropertiesThrombus Aging      +---------+---------------+---------+-----------+----------+-------------------+ CFV      Full           Yes                                               +---------+---------------+---------+-----------+----------+-------------------+ SFJ      Full                                                              +---------+---------------+---------+-----------+----------+-------------------+ FV Prox  Full                                                             +---------+---------------+---------+-----------+----------+-------------------+ FV Mid   Full                                                             +---------+---------------+---------+-----------+----------+-------------------+ FV Distal               Yes      Yes                                      +---------+---------------+---------+-----------+----------+-------------------+  PFV      Full                                                             +---------+---------------+---------+-----------+----------+-------------------+ POP      Full           Yes      Yes                                      +---------+---------------+---------+-----------+----------+-------------------+ PTV      Full                                                             +---------+---------------+---------+-----------+----------+-------------------+ PERO                                                  Not well visualized +---------+---------------+---------+-----------+----------+-------------------+   +---------+---------------+---------+-----------+----------+-------------------+ LEFT     CompressibilityPhasicitySpontaneityPropertiesThrombus Aging      +---------+---------------+---------+-----------+----------+-------------------+ CFV      Full           Yes      Yes                                      +---------+---------------+---------+-----------+----------+-------------------+ SFJ      Full                                                             +---------+---------------+---------+-----------+----------+-------------------+ FV Prox  Full                                                             +---------+---------------+---------+-----------+----------+-------------------+ FV  Mid   Full                                                             +---------+---------------+---------+-----------+----------+-------------------+ FV DistalFull                                                             +---------+---------------+---------+-----------+----------+-------------------+ PFV      Full                                                             +---------+---------------+---------+-----------+----------+-------------------+  POP      Full           Yes      Yes                                      +---------+---------------+---------+-----------+----------+-------------------+ PTV      Full                                                             +---------+---------------+---------+-----------+----------+-------------------+ PERO                                                  Not well visualized +---------+---------------+---------+-----------+----------+-------------------+     Summary: BILATERAL: - No evidence of deep vein thrombosis seen in the lower extremities, bilaterally. -No evidence of popliteal cyst, bilaterally.   *See table(s) above for measurements and observations. Electronically signed by Monica Martinez MD on 04/02/2021 at 7:48:01 PM.    Final    Recent Labs    04/01/21 0308 04/03/21 0528  WBC 7.1 6.4  HGB 10.7* 11.4*  HCT 31.8* 33.3*  PLT 250 301   Recent Labs    04/01/21 0308 04/03/21 0528  NA 130* 132*  K 3.7 3.9  CL 98 100  CO2 23 25  GLUCOSE 91 97  BUN 16 9  CREATININE 0.62 0.62  CALCIUM 8.7* 8.7*    Intake/Output Summary (Last 24 hours) at 04/03/2021 1212 Last data filed at 04/03/2021 0841 Gross per 24 hour  Intake 520 ml  Output --  Net 520 ml        Physical Exam: Vital Signs Blood pressure (!) 133/53, pulse 62, temperature 98.4 F (36.9 C), resp. rate 18, height 5' 1.5" (1.562 m), weight 43.7 kg, SpO2 96 %.  General: Alert and oriented x 3, No apparent distress HEENT: Head is  normocephalic, atraumatic, PERRLA, EOMI, sclera anicteric, oral mucosa pink and moist, dentition intact, ext ear canals clear,  Neck: Supple without JVD or lymphadenopathy Heart: Reg rate and rhythm. No murmurs rubs or gallops Chest: CTA bilaterally without wheezes, rales, or rhonchi; no distress Abdomen: Soft, non-tender, non-distended, bowel sounds positive. Extremities: No clubbing, cyanosis, or edema. Pulses are 2+ Psych: Pt's affect is appropriate. Pt is cooperative Skin: Clean and intact without signs of breakdown Neuro: Pt is cognitively appropriate with normal insight, memory, and awareness. Cranial nerves 2-12 are intact. Decreased LT/PP from mid calves distally. Reflexes are 1+ in all 4's. Fine motor coordination is intact. No tremors. Motor function is grossly 5/5 UE, 4/5 RLE, and 2-4/5 LLE prox to distal d/t pain..  Musculoskeletal: left hip with swelling. Tender along operative site and with PROM. Right SCM slightly tender with movement   Assessment/Plan: 1. Functional deficits which require 3+ hours per day of interdisciplinary therapy in a comprehensive inpatient rehab setting. Physiatrist is providing close team supervision and 24 hour management of active medical problems listed below. Physiatrist and rehab team continue to assess barriers to discharge/monitor patient progress toward functional and medical goals  Care Tool:  Bathing  Bathing assist       Upper Body Dressing/Undressing Upper body dressing   What is the patient wearing?: Hospital gown only    Upper body assist      Lower Body Dressing/Undressing Lower body dressing      What is the patient wearing?: Underwear/pull up (mess panties for purewick/pad)     Lower body assist       Toileting Toileting    Toileting assist Assist for toileting: Moderate Assistance - Patient 50 - 74%     Transfers Chair/bed transfer  Transfers assist     Chair/bed transfer assist level:  Moderate Assistance - Patient 50 - 74%     Locomotion Ambulation   Ambulation assist              Walk 10 feet activity   Assist           Walk 50 feet activity   Assist           Walk 150 feet activity   Assist           Walk 10 feet on uneven surface  activity   Assist           Wheelchair     Assist               Wheelchair 50 feet with 2 turns activity    Assist            Wheelchair 150 feet activity     Assist          Blood pressure (!) 133/53, pulse 62, temperature 98.4 F (36.9 C), resp. rate 18, height 5' 1.5" (1.562 m), weight 43.7 kg, SpO2 96 %.  Medical Problem List and Plan: 1.  Impaired gait and function secondary L femur fx s/p percutaneous pinning- WBAT             -patient may  shower             -ELOS/Goals: 2-3 weeks- supervision to mod I  Patient is beginning CIR therapies today including PT and OT  2.  Antithrombotics: -DVT/anticoagulation:  Continue Eliquis  -dopplers negative             -antiplatelet therapy:  N/A 3. Pain Management: Continue hydrocodone prn severe pain             --primarily using tramadol   -tylenol prn as well  -ice prn 4. Mood: team to provide ego support  -Depression: on zoloft. Mood up beat             -antipsychotic agents: N/A 5. Neuropsych: This patient is capable of making decisions on hee own behalf. 6. Skin/Wound Care: Routine pressure relief measures.             --Monitor incision for healing. 7. Fluids/Electrolytes/Nutrition: encourage PO -I personally reviewed all of the patient's labs today, and lab work is within normal limits except for sodium which is improved at 132 and low albumin             -protein supp for low albumin  -recheck labs Monday 8. PAF: Monitor HR TID--continue Pacerone and Eliquis. Regular rhythm today  -observe for tolerance with therapies 9. CAD s/p CABG/chronic diastolic CHF: Will check weights daily and monitor for  signs of overload. Continue Lasix.             --heart healthy diet.     Filed Weights   04/02/21 1449 04/03/21 0500  Weight:  45.7 kg 43.7 kg    10 HTN: Will monitor BP tid--continue Lasix and Norvasc.  -6/10 controlled 11. H/o COPD/DOE:  Monitor for symptoms with increase in activity. 12. RLS/Chronic insomnia: On melatonin--takes 5-10 mg prn at home.   13. Idiopathic peripheral polyneuropathy: Reports that legs are numb/does not have sensation to urinate but voiding without difficulty. --has seen Dr. [name] in the past.   14. Bones: begin calcium and vitamin D supplements today      LOS: 1 days A FACE TO FACE EVALUATION WAS PERFORMED  Meredith Staggers 04/03/2021, 12:12 PM

## 2021-04-03 NOTE — Progress Notes (Signed)
Inpatient Rehabilitation Care Coordinator Assessment and Plan Patient Details  Name: Maria [name] MRN: [id number] Date of Birth: Aug 25, 1924  Today's Date: 04/03/2021  Hospital Problems: Principal Problem:   Femur fracture, left [facility])  Past Medical History:  Past Medical History:  Diagnosis Date   Anemia    Atonic bladder 12/11/2013   Carotid artery occlusion    right carotid stenosis 40-60%, 4/08, neg for stenosis repeat study 11/11   Celiac disease    Chronic anticoagulation CHADS2VASC2 score 7 10/18/2014   Chronic diastolic CHF (congestive heart failure) (HCC)    takes Lasix daily   CKD (chronic kidney disease), stage III ([place])    Coronary artery disease    a.  08/2013 (LIMA-LAD, rSVG to LCX and Lt atrial clip),   Depression    DNR (do not resuscitate) 03/28/2021   DVT (deep venous thrombosis) ([place]) 09/2013   Gastric ulcer    6-327month ago   GERD (gastroesophageal reflux disease)    takes Omeprazole daily   GI bleeding    Hemorrhoids    Hot flashes    takes ZOloft 3 times a week   Hypertension    Hypothyroidism    takes Synthroid daily as result of AMiodarone   Insomnia    Macular degeneration    Mild aortic insufficiency    a. Echo 10/2013: mild AI.   Moderate mitral regurgitation by prior echocardiogram    a. Echo 10/2013.   Moderate obstructive sleep apnea    uses CPAP;sleep study about 4-540monthago   MVP (mitral valve prolapse)    Osteoporosis    PAF (paroxysmal atrial fibrillation) (HCC)    Peripheral edema    left   Presbycusis    Pulmonary hypertension (HCC)    RLS (restless legs syndrome)    Thyroid nodule    27m9mno change 11/11   Urethral stricture    Urinary anastomotic stricture    Urinary retention    Vitamin D deficiency    Past Surgical History:  Past Surgical History:  Procedure Laterality Date   APPENDECTOMY     bilateral cataract surgery     CARDIAC CATHETERIZATION  08-06-13   CORONARY ARTERY BYPASS GRAFT N/A 08/28/2013    Procedure: CORONARY ARTERY BYPASS GRAFTING (CABG) x2 using right greater saphenous vein and left internal mammary artery. ;  Surgeon: EdwGrace IsaacD;  [facility];  Laterality: N/A;   HIP ARTHROPLASTY Right 12/09/2013   Procedure: ARTHROPLASTY BIPOLAR HIP CEMENTED;  Surgeon: FraKerin SalenD;  [facility] [place] [facility];  Laterality: Right;   HIP PINNING,CANNULATED Left 03/30/2021   Procedure: CANNULATED HIP PINNING;  Surgeon: LanMarchia BondD;  [facility];  Laterality: Left;   INTRAOPERATIVE TRANSESOPHAGEAL ECHOCARDIOGRAM N/A 08/28/2013   Procedure: INTRAOPERATIVE TRANSESOPHAGEAL ECHOCARDIOGRAM;  Surgeon: EdwGrace IsaacD;  [facility];  Laterality: N/A;   LEFT AND RIGHT HEART CATHETERIZATION WITH CORONARY ANGIOGRAM N/A 08/16/2013   Procedure: LEFT AND RIGHT HEART CATHETERIZATION WITH CORONARY ANGIOGRAM;  Surgeon: HenSinclair GroomsD;  [facility];  Service: Cardiovascular;  Laterality: N/A;   MANDIBLE SURGERY     TCS     TONSILLECTOMY     trigger thumb     TUBAL LIGATION     Social History:  reports that she has never smoked. She has never used smokeless tobacco. She reports that she does not drink alcohol and does not  use drugs.  Family / Support Systems Marital Status: Widow/Widower Patient Roles: Parent ChildrenAbbie Sons [phone]  [phone]  Becky-daughter [phone] Other Supports: Son in [employer] ans [place]-friend [phone] Anticipated Caregiver: Unsure at this time depends upon progress here Ability/Limitations of Caregiver: Daughter may be with a few days then have hired assistance if needed. Caregiver Availability: 24/7 (short time) Family Dynamics: Close with her three children although they are out of town they will make arrangments to be there if she needs them. She has many friends there at [facility] and  they will check on her also.  Social History Preferred language: English Religion: Presbyterian Cultural Background: No issues Education: Data processing manager: Yes Write: Yes Employment Status: Retired Public relations account executive Issues: No issues Guardian/Conservator: None-according to MD pt is capable of making her own decisions while here   Abuse/Neglect Abuse/Neglect Assessment Can Be Completed: Yes Physical Abuse: Denies Verbal Abuse: Denies Sexual Abuse: Denies Exploitation of patient/patient's resources: Denies Self-Neglect: Denies  Emotional Status Pt's affect, behavior and adjustment status: Pt is motivated to recover and return to her independent level. She had a hip replacement 8 years ago and knows what it intails. She was driving and taking care of herself prior to this fall. Recent Psychosocial Issues: other health issues managed prior to admission Psychiatric History: No history deferred depression screen due to coping appropriately and verbalizing her concerns and feelings Substance Abuse History: No issues  Patient / Family Perceptions, Expectations & Goals Pt/Family understanding of illness & functional limitations: Pt and daughter can explain her hip fracture and precautions from surgery. They speak with the MD daily and feel they have a good understanding of her treatment plan going forward. Premorbid pt/family roles/activities: Mom, grandmother, retiree, widow, neighbor and church member Anticipated changes in roles/activities/participation: resume Pt/family expectations/goals: Pt states: " I hope to be able to take care of myself like I did before."  Daughter states; '" We will do what she needs for [place] to do to recover from this."  [employer]: Other (Comment) (Lives at [facility] independent [place]) Premorbid Home Care/DME Agencies: Other (Comment) (has bsc and walker from previous surgery) Transportation available at discharge: Pt drove  but will have facility or family transport  Discharge Planning Living Arrangements: Alone Support Systems: Children, Water engineer, Church/faith community Type of Residence: Private residence, [place] Name: [place] Resources: [employer], Multimedia programmer (specify) Web designer) Financial Resources: [place] Referred: No Living Expenses: Own Money Management: Patient Does the patient have any problems obtaining your medications?: No Home Management: Facility cleans for her and provides meals Patient/Family Preliminary Plans: Return home with fmaily member or hired assist. Is aware of the rehab part of [place] if needed. She hopes to not need this. Aware being evaluated today and goals set, feels will be here for 10 days. Care Coordinator Anticipated Follow Up Needs: HH/OP  Clinical Impression Pleasant female who does not look her age, looks much younger. Very active and still drove prior to fall. She has been on rehab before -8 years ago and is familiar with the process. Will await evaluations and work on discharge needs. Have messaged Dietician to meet with regarding her food choices here due to her celiac disease. She will see today-katie  Lorien, Shingler 04/03/2021, 10:28 AM

## 2021-04-03 NOTE — Progress Notes (Signed)
Occupational Therapy Session Note  Patient Details  Name: Maria [name] MRN: [id number] Date of Birth: Oct 04, 1924  Today's Date: 04/03/2021 OT Individual Time: 1310-1405 OT Individual Time Calculation (min): 55 min    Short Term Goals: Week 1:  OT Short Term Goal 1 (Week 1): Patient will complete LB bathing/dressing with close spvsn using AE PRN. OT Short Term Goal 2 (Week 1): Patient will complete 3/3 toileting tasks with close spvsn and LRAD. OT Short Term Goal 3 (Week 1): Patient will complete toilet transfer with close spvsn and LRAD. OT Short Term Goal 4 (Week 1): Patient will complete dynamic standing ADL using LRAD and no overt LOB reaching outside BOS. OT Short Term Goal 5 (Week 1): Patient will identify 3 fall prevention strategies to improve safety at home with no cueing.  Skilled Therapeutic Interventions: Pt received seated in w/c with staff present finishing discussion with dieticians regarding gluten allergy and dietary needs. Pt agreeable to OT session and expressing desire to work on balance because "I want to be able to go home by myself again." Pt taken to gym, sit<>stand min A and stand pivot transfer with RW w/c<>mat min A. Vc's for safe RW management and hand placement with good carryover throughout session. Pt engaged in general conditioning activities to improve strength and balance required for BADLs and functional mobility. Pt completed 10x lateral leans on therapy ball bilaterally and engaged in scapular retraction stretches to improve posture. Engaged in BLE toe tapping activities using numbered/colored dycem circles both seated and standing with RW with sequencing/cognitive component. Min A required in stance. VC's provided throughout for posture, lateral weight shifts, and pushing through arms. Pt reported increased pain with slight LLE hip abduction/internal rotation. Ice pack provided following session. Sit>sup min A in bed with HOB flat. Pt remained supine, alarm  set, call bell in reach, and all needs met.  Therapy Documentation Precautions:  Precautions Precautions: Fall Restrictions Weight Bearing Restrictions: No LLE Weight Bearing: Weight bearing as tolerated  Pain: Pain Assessment Pain Scale: 0-10 Pain Score: 6  Pain Type: Acute pain Pain Location: Hip Pain Orientation: Left Pain Descriptors / Indicators: Aching;Sharp Pain Onset: With Activity Pain Intervention(s): Repositioned;Cold applied;Distraction;Rest Multiple Pain Sites: No   Therapy/Group: Individual Therapy  CLARIVEL CALLAWAY 04/03/2021, 3:15 PM

## 2021-04-03 NOTE — Plan of Care (Signed)
  Problem: RH Furniture Transfers Goal: LTG Patient will perform furniture transfers w/assist (OT/PT) Description: LTG: Patient will perform furniture transfers  with assistance (OT/PT). Flowsheets (Taken 04/03/2021 1726) LTG: Pt will perform furniture transfers with assist:: Supervision/Verbal cueing   Problem: RH Balance Goal: LTG Patient will maintain dynamic sitting balance (PT) Description: LTG:  Patient will maintain dynamic sitting balance with assistance during mobility activities (PT) Flowsheets (Taken 04/03/2021 1726) LTG: Pt will maintain dynamic sitting balance during mobility activities with:: Independent Goal: LTG Patient will maintain dynamic standing balance (PT) Description: LTG:  Patient will maintain dynamic standing balance with assistance during mobility activities (PT) Flowsheets (Taken 04/03/2021 1726) LTG: Pt will maintain dynamic standing balance during mobility activities with:: Supervision/Verbal cueing   Problem: RH Bed Mobility Goal: LTG Patient will perform bed mobility with assist (PT) Description: LTG: Patient will perform bed mobility with assistance, with/without cues (PT). Flowsheets (Taken 04/03/2021 1726) LTG: Pt will perform bed mobility with assistance level of: Independent   Problem: RH Bed to Chair Transfers Goal: LTG Patient will perform bed/chair transfers w/assist (PT) Description: LTG: Patient will perform bed to chair transfers with assistance (PT). Flowsheets (Taken 04/03/2021 1726) LTG: Pt will perform Bed to Chair Transfers with assistance level: Independent with assistive device    Problem: RH Car Transfers Goal: LTG Patient will perform car transfers with assist (PT) Description: LTG: Patient will perform car transfers with assistance (PT). Flowsheets (Taken 04/03/2021 1726) LTG: Pt will perform car transfers with assist:: Supervision/Verbal cueing   Problem: RH Furniture Transfers Goal: LTG Patient will perform furniture transfers  w/assist (OT/PT) Description: LTG: Patient will perform furniture transfers  with assistance (OT/PT). Flowsheets (Taken 04/03/2021 1726) LTG: Pt will perform furniture transfers with assist:: Supervision/Verbal cueing   Problem: RH Ambulation Goal: LTG Patient will ambulate in controlled environment (PT) Description: LTG: Patient will ambulate in a controlled environment, # of feet with assistance (PT). Flowsheets (Taken 04/03/2021 1726) LTG: Pt will ambulate in controlled environ  assist needed:: Supervision/Verbal cueing LTG: Ambulation distance in controlled environment: 150' w/ LRAD Goal: LTG Patient will ambulate in home environment (PT) Description: LTG: Patient will ambulate in home environment, # of feet with assistance (PT). Flowsheets (Taken 04/03/2021 1726) LTG: Pt will ambulate in home environ  assist needed:: Supervision/Verbal cueing LTG: Ambulation distance in home environment: 7' w/ LRAD   Problem: RH Stairs Goal: LTG Patient will ambulate up and down stairs w/assist (PT) Description: LTG: Patient will ambulate up and down # of stairs with assistance (PT) Flowsheets (Taken 04/03/2021 1726) LTG: Pt will ambulate up/down stairs assist needed:: Supervision/Verbal cueing LTG: Pt will  ambulate up and down number of stairs: 1 w/ RW for community access  Magda Kiel, PT

## 2021-04-03 NOTE — Plan of Care (Signed)
  Problem: RH Balance Goal: LTG Patient will maintain dynamic standing with ADLs (OT) Description: LTG:  Patient will maintain dynamic standing balance with assist during activities of daily living (OT)  Flowsheets (Taken 04/03/2021 1539) LTG: Pt will maintain dynamic standing balance during ADLs with: Supervision/Verbal cueing   Problem: Sit to Stand Goal: LTG:  Patient will perform sit to stand in prep for activites of daily living with assistance level (OT) Description: LTG:  Patient will perform sit to stand in prep for activites of daily living with assistance level (OT) Flowsheets (Taken 04/03/2021 1539) LTG: PT will perform sit to stand in prep for activites of daily living with assistance level: Supervision/Verbal cueing   Problem: RH Grooming Goal: LTG Patient will perform grooming w/assist,cues/equip (OT) Description: LTG: Patient will perform grooming with assist, with/without cues using equipment (OT) Flowsheets (Taken 04/03/2021 1539) LTG: Pt will perform grooming with assistance level of: Independent with assistive device    Problem: RH Bathing Goal: LTG Patient will bathe all body parts with assist levels (OT) Description: LTG: Patient will bathe all body parts with assist levels (OT) Flowsheets (Taken 04/03/2021 1539) LTG: Pt will perform bathing with assistance level/cueing: Supervision/Verbal cueing   Problem: RH Dressing Goal: LTG Patient will perform upper body dressing (OT) Description: LTG Patient will perform upper body dressing with assist, with/without cues (OT). Flowsheets (Taken 04/03/2021 1539) LTG: Pt will perform upper body dressing with assistance level of: Independent with assistive device Goal: LTG Patient will perform lower body dressing w/assist (OT) Description: LTG: Patient will perform lower body dressing with assist, with/without cues in positioning using equipment (OT) Flowsheets (Taken 04/03/2021 1539) LTG: Pt will perform lower body dressing with  assistance level of: Supervision/Verbal cueing   Problem: RH Toileting Goal: LTG Patient will perform toileting task (3/3 steps) with assistance level (OT) Description: LTG: Patient will perform toileting task (3/3 steps) with assistance level (OT)  Flowsheets (Taken 04/03/2021 1539) LTG: Pt will perform toileting task (3/3 steps) with assistance level: Supervision/Verbal cueing   Problem: RH Simple Meal Prep Goal: LTG Patient will perform simple meal prep w/assist (OT) Description: LTG: Patient will perform simple meal prep with assistance, with/without cues (OT). Flowsheets (Taken 04/03/2021 1539) LTG: Pt will perform simple meal prep with assistance level of: Supervision/Verbal cueing   Problem: RH Light Housekeeping Goal: LTG Patient will perform light housekeeping w/assist (OT) Description: LTG: Patient will perform light housekeeping with assistance, with/without cues (OT). Flowsheets (Taken 04/03/2021 1539) LTG: Pt will perform light housekeeping with assistance level of: Supervision/Verbal cueing   Problem: RH Toilet Transfers Goal: LTG Patient will perform toilet transfers w/assist (OT) Description: LTG: Patient will perform toilet transfers with assist, with/without cues using equipment (OT) Flowsheets (Taken 04/03/2021 1539) LTG: Pt will perform toilet transfers with assistance level of: Supervision/Verbal cueing   Problem: RH Tub/Shower Transfers Goal: LTG Patient will perform tub/shower transfers w/assist (OT) Description: LTG: Patient will perform tub/shower transfers with assist, with/without cues using equipment (OT) Flowsheets (Taken 04/03/2021 1539) LTG: Pt will perform tub/shower stall transfers with assistance level of: Supervision/Verbal cueing   Problem: RH Furniture Transfers Goal: LTG Patient will perform furniture transfers w/assist (OT/PT) Description: LTG: Patient will perform furniture transfers  with assistance (OT/PT). Flowsheets (Taken 04/03/2021  1539) LTG: Pt will perform furniture transfers with assist:: Supervision/Verbal cueing

## 2021-04-03 NOTE — Progress Notes (Signed)
[facility] Individual Statement of Services  Patient Name:  Maria [name]  Date:  04/03/2021  Welcome to the [facility].  Our goal is to provide you with an individualized program based on your diagnosis and situation, designed to meet your specific needs.  With this comprehensive rehabilitation program, you will be expected to participate in at least 3 hours of rehabilitation therapies Monday-Friday, with modified therapy programming on the weekends.  Your rehabilitation program will include the following services:  Physical Therapy (PT), Occupational Therapy (OT), 24 hour per day rehabilitation nursing, Care Coordinator, Rehabilitation Medicine, Nutrition Services, and Pharmacy Services  Weekly team conferences will be held on Tuesday to discuss your progress.  Your Inpatient Rehabilitation Care Coordinator will talk with you frequently to get your input and to update you on team discussions.  Team conferences with you and your family in attendance may also be held.  Expected length of stay: 7-10 days  Overall anticipated outcome: supervision-independent level  Depending on your progress and recovery, your program may change. Your Inpatient Rehabilitation Care Coordinator will coordinate services and will keep you informed of any changes. Your Inpatient Rehabilitation Care Coordinator's name and contact numbers are listed  below.  The following services may also be recommended but are not provided by the [facility] will be made to provide these services after discharge if needed.  Arrangements include referral to agencies that provide these services.  Your insurance has been verified to be:  [employer] Your primary doctor is:  Radio broadcast assistant  Pertinent information will be shared with your doctor and your  insurance company.  Inpatient Rehabilitation Care Coordinator:  Ovidio Kin, Millen or Emilia Beck  Information discussed with and copy given to patient by: Elease Hashimoto, 04/03/2021, 10:33 AM

## 2021-04-03 NOTE — Progress Notes (Signed)
Initial Nutrition Assessment  DOCUMENTATION CODES:   Underweight, Non-severe (moderate) malnutrition in context of chronic illness  INTERVENTION:   - Food preferences obtained  - Ensure Enlive po BID, each supplement provides 350 kcal and 20 grams of protein  - MVI with minerals daily  NUTRITION DIAGNOSIS:   Moderate Malnutrition related to chronic illness (COPD, CKD) as evidenced by moderate fat depletion, severe muscle depletion.  GOAL:   Patient will meet greater than or equal to 90% of their needs   MONITOR:   PO intake, Supplement acceptance, Labs, Weight trends  REASON FOR ASSESSMENT:   Consult Assessment of nutrition requirement/status  ASSESSMENT:   87 year old female with PMH of CAD, CKD III, celiac disease, COPD, atonic bladder, PAF. Pt sustained a fall with nondisplaced L basicervical femur fracture. Pt underwent L hip pinning on 6/06. Admitted to CIR on 6/09.  Pt currently on a gluten-free diet.  Spoke with pt at bedside at the request of LCSW who reported pt had questions for the dietitian. Pt reports that she received cereal on her breakfast meal tray that was not gluten-free. She did not eat this cereal. Discussed with Patient Risk analyst and later returned to pt's room with Patient Services Manager to obtain pt's preferences for gluten-free food items. Patient Services Manager is going to go shopping for these food items for patient and bring them in on Monday.  Pt reports that she overall has a fair to good appetite. Pt reports typically eating 2-3 meals daily at home. Pt was diagnosed with Celiac disease when she was 87 years old and has been following a modified diet since that time. Pt reports that when she does eat gluten, she experiences diarrhea.  Reviewed weight history in chart. Pt with a 5.3 kg weight loss over the last year. This is a 10.8% weight loss which is not significant for timeframe. Pt does meet criteria for malnutrition based on  NFPE.  Pt willing to consume Ensure Enlive oral nutrition supplements daily. Pt very appreciative of plan to get her gluten-free food and snack options.  Admit weight: 45.7 kg Current weight: 43.7 kg  Meal Completion: 25-100%  Medications reviewed and include: pepcid, ferrous sulfate, lasix, melatonin, MVI with minerals, senna  Labs reviewed: sodium 132  NUTRITION - FOCUSED PHYSICAL EXAM:  Flowsheet Row Most Recent Value  Orbital Region Moderate depletion  Upper Arm Region Moderate depletion  Thoracic and Lumbar Region Mild depletion  Buccal Region Mild depletion  Temple Region Moderate depletion  Clavicle Bone Region Severe depletion  Clavicle and Acromion Bone Region Severe depletion  Scapular Bone Region Moderate depletion  Dorsal Hand Moderate depletion  Patellar Region Mild depletion  Anterior Thigh Region Moderate depletion  Posterior Calf Region Moderate depletion  Edema (RD Assessment) None  Hair Reviewed  Eyes Reviewed  Mouth Reviewed  Skin Reviewed  Nails Reviewed       Diet Order:   Diet Order             Diet gluten free Room service appropriate? Yes with Assist; Fluid consistency: Thin  Diet effective now                   EDUCATION NEEDS:   Education needs have been addressed  Skin:  Skin Assessment: Skin Integrity Issues: Incisions: L hip  Last BM:  04/03/21  Height:   Ht Readings from Last 1 Encounters:  04/02/21 5' 1.5" (1.562 m)    Weight:   Wt Readings from Last 1 Encounters:  04/03/21 43.7 kg    BMI:  Body mass index is 17.91 kg/m.  Estimated Nutritional Needs:   Kcal:  1450-1650  Protein:  70-85 grams  Fluid:  1.4-1.6 L    Gustavus Bryant, MS, RD, LDN Inpatient Clinical Dietitian Please see [facility] for contact information.

## 2021-04-04 DIAGNOSIS — E440 Moderate protein-calorie malnutrition: Secondary | ICD-10-CM | POA: Insufficient documentation

## 2021-04-04 MED ORDER — SENNOSIDES-DOCUSATE SODIUM 8.6-50 MG PO TABS
1.0000 | ORAL_TABLET | Freq: Every day | ORAL | Status: DC
  Administered 2021-04-05 – 2021-04-15 (×11): 1 via ORAL
  Filled 2021-04-04 (×11): qty 1

## 2021-04-04 NOTE — Progress Notes (Addendum)
Physical Therapy Session Note  Patient Details  Name: JILLIAN WARTH MRN: [id number] Date of Birth: 1924/03/07  Today's Date: 04/04/2021 PT Individual Time: 0905-1005; 8250-0370 PT Individual Time Calculation (min): 60 min , 44 min  Short Term Goals: Week 1:  PT Short Term Goal 1 (Week 1): STG=LTG due to ELOS  Skilled Therapeutic Interventions/Progress Updates:    Tx 1:  Pt seated in wc.  She rated pain in L thigh 5/10; premedicated.  Pt brushing teeth at sink independently.  Use of Kinetron in sitting in wc, level 70 cm/sec, x 30 cycles for AROM , limited excursion. Seated 10 x 1 L long arc quad knee extension iwht ankle DF, bil scapular retraction.  Standing with bil UE support- 12 x 1 each :L hip abduction to pain tolerance; bil toe raises with multimodal cues for hip strategy for balance.  Gait training with RW x 125' with CGA and cues for upright trunk, forward gaze.  Sit>< stand with CGA, and cues for hand placement.   Transfer training for R/L long arc quad knee extension to remove feet from foot plates, rather than lifting L thigh with hands.  At end of session, pt seated in wc with needs at hand and seat belt alarm set.  Tx 2:  Pt seated in recliner.  She rated hip pain 2/10 at rest, premedicated.  Therapeutic exercises performed with LEs to increase strength for functional mobility: seated- 10 x 1 R/L long arc quad knee extension, L knee flexion/extension with foot (on towel on floor to reduce friction), bil abduction/adduction with towels under q foot. In standing with bil UE support, 10 x 1 bil heel/toe raises, and mini squats.   Gait training with RW on level tile with multiple turns, x 145' with CGA, and cues for upright trunk and forward gaze.  At end of session, pt seated in recliner with needs at hand and seat belt alarm set.  Pt would benefit from cushion or pillows under her when seated in recliner, for appropriate table ht for meals.  PT informed Janett Billow,  NT.    Therapy Documentation Precautions:  Precautions Precautions: Fall Restrictions Weight Bearing Restrictions: Yes LLE Weight Bearing: Weight bearing as tolerated        Therapy/Group: Individual Therapy  Blayden Conwell 04/04/2021, 10:10 AM

## 2021-04-04 NOTE — Progress Notes (Addendum)
PROGRESS NOTE   Subjective/Complaints: Pt reports poor sleep- over-exhausted from therapy and sore from therapy.  Having BM's 3x/day and asked to reduce bowel meds.  Asking for calcium to be removed- said was told by Cardiology that calcium would increase risk of clots.     ROS:  Pt denies SOB, abd pain, CP, N/V/C/D, and vision changes    Objective:   VAS [place] LOWER EXTREMITY VENOUS (DVT)  Result Date: 04/02/2021  Lower Venous DVT Study Patient Name:  Maria [name]  Date of Exam:   04/02/2021 Medical Rec #: [id number]          Accession #:    [id number] Date of Birth: 11-11-1923          Patient Gender: F Patient Age:   62Y Exam Location:  [facility] Procedure:      VAS [place] LOWER EXTREMITY VENOUS (DVT) Referring Phys: [id number] RALPH A NETTEY --------------------------------------------------------------------------------  Indications: Edema.  Comparison Study: no prior Performing Technologist: [place] Ponder RVS  Examination Guidelines: A complete evaluation includes B-mode imaging, spectral Doppler, color Doppler, and power Doppler as needed of all accessible portions of each vessel. Bilateral testing is considered an integral part of a complete examination. Limited examinations for reoccurring indications may be performed as noted. The reflux portion of the exam is performed with the patient in reverse Trendelenburg.  +---------+---------------+---------+-----------+----------+-------------------+ RIGHT    CompressibilityPhasicitySpontaneityPropertiesThrombus Aging      +---------+---------------+---------+-----------+----------+-------------------+ CFV      Full           Yes                                               +---------+---------------+---------+-----------+----------+-------------------+ SFJ      Full                                                              +---------+---------------+---------+-----------+----------+-------------------+ FV Prox  Full                                                             +---------+---------------+---------+-----------+----------+-------------------+ FV Mid   Full                                                             +---------+---------------+---------+-----------+----------+-------------------+ FV Distal               Yes      Yes                                      +---------+---------------+---------+-----------+----------+-------------------+  PFV      Full                                                             +---------+---------------+---------+-----------+----------+-------------------+ POP      Full           Yes      Yes                                      +---------+---------------+---------+-----------+----------+-------------------+ PTV      Full                                                             +---------+---------------+---------+-----------+----------+-------------------+ PERO                                                  Not well visualized +---------+---------------+---------+-----------+----------+-------------------+   +---------+---------------+---------+-----------+----------+-------------------+ LEFT     CompressibilityPhasicitySpontaneityPropertiesThrombus Aging      +---------+---------------+---------+-----------+----------+-------------------+ CFV      Full           Yes      Yes                                      +---------+---------------+---------+-----------+----------+-------------------+ SFJ      Full                                                             +---------+---------------+---------+-----------+----------+-------------------+ FV Prox  Full                                                             +---------+---------------+---------+-----------+----------+-------------------+ FV  Mid   Full                                                             +---------+---------------+---------+-----------+----------+-------------------+ FV DistalFull                                                             +---------+---------------+---------+-----------+----------+-------------------+ PFV      Full                                                             +---------+---------------+---------+-----------+----------+-------------------+  POP      Full           Yes      Yes                                      +---------+---------------+---------+-----------+----------+-------------------+ PTV      Full                                                             +---------+---------------+---------+-----------+----------+-------------------+ PERO                                                  Not well visualized +---------+---------------+---------+-----------+----------+-------------------+     Summary: BILATERAL: - No evidence of deep vein thrombosis seen in the lower extremities, bilaterally. -No evidence of popliteal cyst, bilaterally.   *See table(s) above for measurements and observations. Electronically signed by Sherald Hess MD on 04/02/2021 at 7:48:01 PM.    Final    Recent Labs    04/03/21 0528  WBC 6.4  HGB 11.4*  HCT 33.3*  PLT 301   Recent Labs    04/03/21 0528  NA 132*  K 3.9  CL 100  CO2 25  GLUCOSE 97  BUN 9  CREATININE 0.62  CALCIUM 8.7*    Intake/Output Summary (Last 24 hours) at 04/04/2021 1009 Last data filed at 04/04/2021 0832 Gross per 24 hour  Intake 600 ml  Output --  Net 600 ml        Physical Exam: Vital Signs Blood pressure (!) 147/57, pulse 62, temperature 98.4 F (36.9 C), temperature source Oral, resp. rate 19, height 5' 1.5" (1.562 m), weight 43.7 kg, SpO2 97 %.    General: awake, alert, appropriate, sitting up in w/c at sink; NT in room; NAD HENT: conjugate gaze; oropharynx moist;  putting on cream on face- appears younger than stated age.  CV: regular rate; no JVD Pulmonary: CTA B/L; no W/R/R- good air movement GI: soft, NT, ND, (+)BS- normoactive Psychiatric: appropriate- interactive Neurological: alert Extremities: No clubbing, cyanosis, or edema. Pulses are 2+ Psych: Pt's affect is appropriate. Pt is cooperative Skin: Clean and intact without signs of breakdown Neuro: Pt is cognitively appropriate with normal insight, memory, and awareness. Cranial nerves 2-12 are intact. Decreased LT/PP from mid calves distally. Reflexes are 1+ in all 4's. Fine motor coordination is intact. No tremors. Motor function is grossly 5/5 UE, 4/5 RLE, and 2-4/5 LLE prox to distal d/t pain..  Musculoskeletal: left hip with swelling. Tender along operative site and with PROM. Right SCM slightly tender with movement   Assessment/Plan: 1. Functional deficits which require 3+ hours per day of interdisciplinary therapy in a comprehensive inpatient rehab setting. Physiatrist is providing close team supervision and 24 hour management of active medical problems listed below. Physiatrist and rehab team continue to assess barriers to discharge/monitor patient progress toward functional and medical goals  Care Tool:  Bathing    Body parts bathed by patient: Right arm, Left arm, Chest, Abdomen, Front perineal area, Buttocks, Right upper leg, Left upper leg, Right lower leg, Left lower  leg, Face   Body parts bathed by helper: Left lower leg     Bathing assist Assist Level: Minimal Assistance - Patient > 75%     Upper Body Dressing/Undressing Upper body dressing   What is the patient wearing?: Hospital gown only, Bra    Upper body assist Assist Level: Minimal Assistance - Patient > 75%    Lower Body Dressing/Undressing Lower body dressing      What is the patient wearing?: Underwear/pull up, Pants     Lower body assist Assist for lower body dressing: Minimal Assistance - Patient >  75%     Toileting Toileting    Toileting assist Assist for toileting: Minimal Assistance - Patient > 75%     Transfers Chair/bed transfer  Transfers assist     Chair/bed transfer assist level: Minimal Assistance - Patient > 75%     Locomotion Ambulation   Ambulation assist      Assist level: Minimal Assistance - Patient > 75% Assistive device: Walker-rolling Max distance: 100'   Walk 10 feet activity   Assist     Assist level: Minimal Assistance - Patient > 75% Assistive device: Walker-rolling   Walk 50 feet activity   Assist    Assist level: Minimal Assistance - Patient > 75% Assistive device: Walker-rolling    Walk 150 feet activity   Assist Walk 150 feet activity did not occur: Safety/medical concerns         Walk 10 feet on uneven surface  activity   Assist     Assist level: Minimal Assistance - Patient > 75% Assistive device: Photographer Will patient use wheelchair at discharge?: No Type of Wheelchair: Manual    Wheelchair assist level: Supervision/Verbal cueing Max wheelchair distance: 60'    Wheelchair 50 feet with 2 turns activity    Assist        Assist Level: Supervision/Verbal cueing   Wheelchair 150 feet activity     Assist  Wheelchair 150 feet activity did not occur: Safety/medical concerns       Blood pressure (!) 147/57, pulse 62, temperature 98.4 F (36.9 C), temperature source Oral, resp. rate 19, height 5' 1.5" (1.562 m), weight 43.7 kg, SpO2 97 %.  Medical Problem List and Plan: 1.  Impaired gait and function secondary L femur fx s/p percutaneous pinning- WBAT             -patient may  shower             -ELOS/Goals: 2-3 weeks- supervision to mod I  Con't PT and OT- WBAT on LLE 2.  Antithrombotics: -DVT/anticoagulation:  Continue Eliquis  -dopplers negative             -antiplatelet therapy:  N/A 3. Pain Management: Continue hydrocodone prn severe pain              --primarily using tramadol   -tylenol prn as well  -ice prn  6/11- pt only taking tramadol- con't regimen and monitor 4. Mood: team to provide ego support  -Depression: on zoloft. Mood up beat             -antipsychotic agents: N/A 5. Neuropsych: This patient is capable of making decisions on hee own behalf. 6. Skin/Wound Care: Routine pressure relief measures.             --Monitor incision for healing. 7. Fluids/Electrolytes/Nutrition: encourage PO -I personally reviewed all of the patient's labs today, and lab work is  within normal limits except for sodium which is improved at 132 and low albumin             -protein supp for low albumin  -recheck labs Monday 8. PAF: Monitor HR TID--continue Pacerone and Eliquis. Regular rhythm today  6/11- rhythm regular today- con't regimen  -observe for tolerance with therapies 9. CAD s/p CABG/chronic diastolic CHF: Will check weights daily and monitor for signs of overload. Continue Lasix.             --heart healthy diet.     Filed Weights   04/02/21 1449 04/03/21 0500  Weight: 45.7 kg 43.7 kg    6/11- no weight today- will make sure has daily weights.  10 HTN: Will monitor BP tid--continue Lasix and Norvasc.  -6/10 controlled 11. H/o COPD/DOE:  Monitor for symptoms with increase in activity. 12. RLS/Chronic insomnia: On melatonin--takes 5-10 mg prn at home.   13. Idiopathic peripheral polyneuropathy: Reports that legs are numb/does not have sensation to urinate but voiding without difficulty. --has seen Dr. Everlena Cooper in the past.   14. Bones: begin calcium and vitamin D supplements today   6/11- pt insistent we need to speak with Cardiology before she will take Calci- was told she would get clots with Calcium?We can call Cards/check Monday?   15. Loose stools  6/11- having 3 BM's per day- will decrease senokot to 1x/day.   LOS: 2 days A FACE TO FACE EVALUATION WAS PERFORMED  Clydell Sposito 04/04/2021, 10:09 AM

## 2021-04-04 NOTE — Progress Notes (Signed)
Up to [facility] x 3 this shift. Continent of B & B. Requesting NSL be removed. No PRN meds given thus far this shift. Requesting ultram 1 hour before therapy. Ice applied to left hip. Bilateral heels elevated off bed with pillow. Left hip with foam dressing, edema & bruising noted. Patrici Ranks A

## 2021-04-04 NOTE — Progress Notes (Signed)
Occupational Therapy Session Note  Patient Details  Name: Maria [name] MRN: [id number] Date of Birth: July 16, 1924  Today's Date: 04/04/2021 OT Individual Time: 2542-7062 and 3762-8315 OT Individual Time Calculation (min): 65 min and 30 min   Short Term Goals: Week 1:  OT Short Term Goal 1 (Week 1): Patient will complete LB bathing/dressing with close spvsn using AE PRN. OT Short Term Goal 2 (Week 1): Patient will complete 3/3 toileting tasks with close spvsn and LRAD. OT Short Term Goal 3 (Week 1): Patient will complete toilet transfer with close spvsn and LRAD. OT Short Term Goal 4 (Week 1): Patient will complete dynamic standing ADL using LRAD and no overt LOB reaching outside BOS. OT Short Term Goal 5 (Week 1): Patient will identify 3 fall prevention strategies to improve safety at home with no cueing.  Skilled Therapeutic Interventions/Progress Updates:    Session 1: Pt received supine in bed expressing fatigue and anxiety with pain. OT provided education on recovery, pain management techniques, while also providing schedule. RN present to provide pain meds, continuing education on timing of meds for pain management. With increased time, pt transitioned to sitting EOB with min A to manage LLE. Pt ate breakfast sitting EOB with no significant change in pt reported. OT discussed goals and treatment techniques to promote progress to PLOF. Progressed to completing stand pivot transfer bed>w/c with min A using RW. Discussed modifying scheduling to start later in the day, allowing pt to eat breakfast prior to therapy. Pt left sitting in w/c with all needs in reach.  Session 2: OT session focused on functional endurance and transfers. Pt received sitting in w/c agreeable to therapy. Completed functional task in stand for appx 2 min and 3 min with pt placing minimal weight through LLE. Pt returned to room and walk short distances recliner<>bathroom with CGA, completing toileting task and hand  hygiene in standing with CGA.  Therapy Documentation Precautions:  Precautions Precautions: Fall Restrictions Weight Bearing Restrictions: Yes LLE Weight Bearing: Weight bearing as tolerated General:   Vital Signs: Therapy Vitals Temp: 98.4 F (36.9 C) Temp Source: Oral Pulse Rate: 62 Resp: 19 BP: (!) 147/57 Patient Position (if appropriate): Sitting Oxygen Therapy SpO2: 97 % O2 Device: Room Air Pain: Pain Assessment Pain Scale: 0-10 Pain Score: 4  Pain Type: Acute pain Pain Location: Hip Pain Orientation: Left Pain Descriptors / Indicators: Aching Pain Frequency: Intermittent Pain Onset: With Activity Patients Stated Pain Goal: 0 Pain Intervention(s): Medication (See eMAR) ADL: ADL Eating: Set up Where Assessed-Eating: Wheelchair, Bed level Grooming: Setup Where Assessed-Grooming: Sitting at sink, Wheelchair, Bed level Upper Body Bathing: Setup Where Assessed-Upper Body Bathing: Sitting at sink, Wheelchair Lower Body Bathing: Minimal assistance, Contact guard Where Assessed-Lower Body Bathing: Standing at sink, Sitting at sink, Wheelchair Upper Body Dressing: Minimal assistance Where Assessed-Upper Body Dressing: Sitting at sink, Wheelchair Lower Body Dressing: Contact guard (L sock requires total A) Where Assessed-Lower Body Dressing: Standing at sink, Sitting at sink, Wheelchair Toileting: Minimal assistance (per pt report using BSC in room) Where Assessed-Toileting: Bedside Commode Toilet Transfer: Minimal assistance Toilet Transfer Method: Arts development officer: Geophysical data processor: Not assessed, Other (comment) (Pt requesting wash up at sink at eval) Vision   Perception    Praxis   Exercises:   Other Treatments:     Therapy/Group: Individual Therapy  Duayne Cal 04/04/2021, 7:53 AM

## 2021-04-05 DIAGNOSIS — E440 Moderate protein-calorie malnutrition: Secondary | ICD-10-CM

## 2021-04-05 NOTE — Progress Notes (Signed)
Occupational Therapy Session Note  Patient Details  Name: Maria [name] MRN: [id number] Date of Birth: 1924/07/13  Today's Date: 04/05/2021 OT Individual Time: 0900-1000 OT Individual Time Calculation (min): 60 min    Short Term Goals: Week 1:  OT Short Term Goal 1 (Week 1): Patient will complete LB bathing/dressing with close spvsn using AE PRN. OT Short Term Goal 2 (Week 1): Patient will complete 3/3 toileting tasks with close spvsn and LRAD. OT Short Term Goal 3 (Week 1): Patient will complete toilet transfer with close spvsn and LRAD. OT Short Term Goal 4 (Week 1): Patient will complete dynamic standing ADL using LRAD and no overt LOB reaching outside BOS. OT Short Term Goal 5 (Week 1): Patient will identify 3 fall prevention strategies to improve safety at home with no cueing.  Skilled Therapeutic Interventions/Progress Updates:    Patient seated in recliner, alert and ready for therapy session.  She notes mild pain in left hip that was somewhat relieved by mobility.  Sit to stand and short distance ambulation with RW to/from recliner, w/c, toilet and bed with CGA.  She completed oral care and grooming tasks with set up, toileting with CGA.   Completed standing weight shift and left LE AROM exercises with brief rest breaks between exercises.  She returned to bed at close of session.  Sit to supine with min A.  Reviewed progress to date and discussed independent vs supervised living post discharge.  Bed alarm set and call bell in hand.    Therapy Documentation Precautions:  Precautions Precautions: Fall Restrictions Weight Bearing Restrictions: Yes LLE Weight Bearing: Weight bearing as tolerated   Therapy/Group: Individual Therapy  Carlos Levering 04/05/2021, 7:26 AM

## 2021-04-05 NOTE — Progress Notes (Signed)
PROGRESS NOTE   Subjective/Complaints:   LBM yesterday- soft, but not diarrhea- some urgency Had pain meds 2 hours ago- doing OK- at least at rest.    ROS:   Pt denies SOB, abd pain, CP, N/V/C/D, and vision changes   Objective:   No results found. Recent Labs    04/03/21 0528  WBC 6.4  HGB 11.4*  HCT 33.3*  PLT 301   Recent Labs    04/03/21 0528  NA 132*  K 3.9  CL 100  CO2 25  GLUCOSE 97  BUN 9  CREATININE 0.62  CALCIUM 8.7*    Intake/Output Summary (Last 24 hours) at 04/05/2021 1012 Last data filed at 04/05/2021 0900 Gross per 24 hour  Intake 480 ml  Output --  Net 480 ml        Physical Exam: Vital Signs Blood pressure (!) 143/55, pulse 67, temperature 98 F (36.7 C), resp. rate 20, height 5' 1.5" (1.562 m), weight 46.3 kg, SpO2 95 %.     General: awake, alert, appropriate, on toilet; OT at side; NAD HENT: conjugate gaze; oropharynx moist CV: regular rate; no JVD Pulmonary: CTA B/L; no W/R/R- good air movement GI: soft, NT, ND, (+)BS- hyperactive Psychiatric: appropriate; interactive Neurological: alert Extremities: No clubbing, cyanosis, or edema. Pulses are 2+ Psych: Pt's affect is appropriate. Pt is cooperative Skin: Clean and intact without signs of breakdown- L Hip incision C/D/I- also IV L forearm- a little bleeding seen- needs to be remov Neuro: Pt is cognitively appropriate with normal insight, memory, and awareness. Cranial nerves 2-12 are intact. Decreased LT/PP from mid calves distally. Reflexes are 1+ in all 4's. Fine motor coordination is intact. No tremors. Motor function is grossly 5/5 UE, 4/5 RLE, and 2-4/5 LLE prox to distal d/t pain..  Musculoskeletal: left hip with swelling. Tender along operative site and with PROM. Right SCM slightly tender with movement   Assessment/Plan: 1. Functional deficits which require 3+ hours per day of interdisciplinary therapy in a  comprehensive inpatient rehab setting. Physiatrist is providing close team supervision and 24 hour management of active medical problems listed below. Physiatrist and rehab team continue to assess barriers to discharge/monitor patient progress toward functional and medical goals  Care Tool:  Bathing    Body parts bathed by patient: Right arm, Left arm, Chest, Abdomen, Front perineal area, Buttocks, Right upper leg, Left upper leg, Right lower leg, Left lower leg, Face   Body parts bathed by helper: Left lower leg     Bathing assist Assist Level: Minimal Assistance - Patient > 75%     Upper Body Dressing/Undressing Upper body dressing   What is the patient wearing?: Hospital gown only, Bra    Upper body assist Assist Level: Minimal Assistance - Patient > 75%    Lower Body Dressing/Undressing Lower body dressing      What is the patient wearing?: Underwear/pull up     Lower body assist Assist for lower body dressing: Minimal Assistance - Patient > 75%     Toileting Toileting    Toileting assist Assist for toileting: Minimal Assistance - Patient > 75%     Transfers Chair/bed transfer  Transfers assist  Chair/bed transfer assist level: Contact Guard/Touching assist     Locomotion Ambulation   Ambulation assist      Assist level: Contact Guard/Touching assist Assistive device: Walker-rolling Max distance: 145   Walk 10 feet activity   Assist     Assist level: Contact Guard/Touching assist Assistive device: Walker-rolling   Walk 50 feet activity   Assist    Assist level: Contact Guard/Touching assist Assistive device: Walker-rolling    Walk 150 feet activity   Assist Walk 150 feet activity did not occur: Safety/medical concerns         Walk 10 feet on uneven surface  activity   Assist     Assist level: Minimal Assistance - Patient > 75% Assistive device: Aeronautical engineer Will patient use wheelchair  at discharge?: No Type of Wheelchair: Manual    Wheelchair assist level: Supervision/Verbal cueing Max wheelchair distance: 43'    Wheelchair 50 feet with 2 turns activity    Assist        Assist Level: Supervision/Verbal cueing   Wheelchair 150 feet activity     Assist  Wheelchair 150 feet activity did not occur: Safety/medical concerns       Blood pressure (!) 143/55, pulse 67, temperature 98 F (36.7 C), resp. rate 20, height 5' 1.5" (1.562 m), weight 46.3 kg, SpO2 95 %.  Medical Problem List and Plan: 1.  Impaired gait and function secondary L femur fx s/p percutaneous pinning- WBAT             -patient may  shower             -ELOS/Goals: 2-3 weeks- supervision to mod I  Con't PT and OT- WBAT on LLE 2.  Antithrombotics: -DVT/anticoagulation:  Continue Eliquis  -dopplers negative             -antiplatelet therapy:  N/A 3. Pain Management: Continue hydrocodone prn severe pain             --primarily using tramadol   -tylenol prn as well  -ice prn  6/12- pt taking tramadol- last 2 hrs ago- controlled at rest at least- con't regimen 4. Mood: team to provide ego support  -Depression: on zoloft. Mood up beat             -antipsychotic agents: N/A 5. Neuropsych: This patient is capable of making decisions on hee own behalf. 6. Skin/Wound Care: Routine pressure relief measures.             --Monitor incision for healing. 7. Fluids/Electrolytes/Nutrition: encourage PO -I personally reviewed all of the patient's labs today, and lab work is within normal limits except for sodium which is improved at 132 and low albumin             -protein supp for low albumin  -recheck labs Monday 8. PAF: Monitor HR TID--continue Pacerone and Eliquis. Regular rhythm today  6/11- rhythm regular today- con't regimen  -observe for tolerance with therapies 9. CAD s/p CABG/chronic diastolic CHF: Will check weights daily and monitor for signs of overload. Continue Lasix.              --heart healthy diet.     Filed Weights   04/02/21 1449 04/03/21 0500 04/05/21 0600  Weight: 45.7 kg 43.7 kg 46.3 kg    6/11- no weight today- will make sure has daily weights. 6/12- Weight up to 46.3 kg- 3 kg, however 2 days ago- is stable from then- con't  to monitor  10 HTN: Will monitor BP tid--continue Lasix and Norvasc.  -6/10 controlled 11. H/o COPD/DOE:  Monitor for symptoms with increase in activity. 12. RLS/Chronic insomnia: On melatonin--takes 5-10 mg prn at home.   13. Idiopathic peripheral polyneuropathy: Reports that legs are numb/does not have sensation to urinate but voiding without difficulty. --has seen Dr. [name] in the past.   14. Bones: begin calcium and vitamin D supplements today   6/11- pt insistent we need to speak with Cardiology before she will take [facility] [facility]- was told she would get clots with Calcium?We can call Cards/check Monday?   15. Loose stools  6/11- having 3 BM's per day- will decrease senokot to 1x/day.   6/12- pt said still soft- asked her to try this dose for a couple of days. 16. IV LUE- will remove- not using and been in 1 week.   LOS: 3 days A FACE TO FACE EVALUATION WAS PERFORMED  Angel Hobdy 04/05/2021, 10:12 AM

## 2021-04-05 NOTE — Progress Notes (Signed)
Slept good. PRN ultram given at 2224 with relief. Left hip swollen and bruised, foam dressing in place-CD&I. Maria [name] A

## 2021-04-06 LAB — BASIC METABOLIC PANEL
Anion gap: 9 (ref 5–15)
BUN: 10 mg/dL (ref 8–23)
CO2: 24 mmol/L (ref 22–32)
Calcium: 8.8 mg/dL — ABNORMAL LOW (ref 8.9–10.3)
Chloride: 98 mmol/L (ref 98–111)
Creatinine, Ser: 0.58 mg/dL (ref 0.44–1.00)
GFR, Estimated: >60 mL/min (ref >60)
Glucose, Bld: 96 mg/dL (ref 70–99)
Potassium: 4.2 mmol/L (ref 3.5–5.1)
Sodium: 131 mmol/L — ABNORMAL LOW (ref 135–145)

## 2021-04-06 NOTE — Progress Notes (Signed)
Physical Therapy Session Note  Patient Details  Name: Maria [name] MRN: [id number] Date of Birth: [birth year]  Today's Date: 04/06/2021 PT Individual Time:Session1: 0900-1000; Session2: 0175-1025 PT Individual Time Calculation (min): 60 min & 25 min  Short Term Goals: Week 1:  PT Short Term Goal 1 (Week 1): STG=LTG due to ELOS  Skilled Therapeutic Interventions/Progress Updates:    Session1: Patient at [facility] with OT in the room.  Reports had simulated shower as likes to bathe at night.  Plans to go to [employer] [facility] [employer] if able.  Patient sit to stand min A to RW.  Ambulated 180' with CGA with RW.  Patient performed standing balance with feet apart on level tile to fold towels with close S to CGA.  Patient standing on [employer] for folding towels with CGA.  Then on [employer] no UE support and head nods x 10 with CGA, then head turns with min A and cues for forward gaze when LOB posterior to allow to regain balance.  Patient ambulated with rollator x 70' with min A and unable to manage walker safely or unweight L LE as with standard RW.  Patient seated EOB for LAQ x 10, hip abduction/adduction x 10, ankle DF/PF x 10 and 5x sit to stand with CGA.  Patient ambulated another 43' towards her room prior to fatigue.  Assisted in w/c to room and stand step to bed with RW and CGA.  Sit to supine min A for L LE.  Left with call bell & needs in reach and bed alarm set.   [place]: Patient in supine and reports fatigue, but willing to try.  Performed in bed therex including heel slides on L, hip adductor squeezes bilat w/ 5 sec hold, SAQ, heel slides, hip abduction and quad sets all x 10.  Patient supine to sit with S and increased time.  Patient sit to stand to RW CGA and ambulated in simulated home environment around bed to check water in plants with RW and close S, then to door, then side stepping around obstacles too narrow for walker to fit through.  Ambulated about 100' in the room around tight  area.  Patient sit to supine with S.  Min A for L LE in bed.  Left with call bell and needs in reach and bed alarm set.   Therapy Documentation Precautions:  Precautions Precautions: Fall Restrictions Weight Bearing Restrictions: Yes LLE Weight Bearing: Weight bearing as tolerated  Pain: Pain Assessment Pain Scale: 0-10 Pain Score: 0-No pain    Therapy/Group: Individual Therapy  [name] Magda Kiel, PT 04/06/2021, 8:53 AM

## 2021-04-06 NOTE — Progress Notes (Signed)
PROGRESS NOTE   Subjective/Complaints: Pain controlled with medications She has no other complaints Na 131 Talking on the phone with her daughter beforehand   ROS:   Pt denies SOB, abd pain, CP, N/V/C/D, and vision changes, pain is controlled with medications   Objective:   No results found. No results for input(s): WBC, HGB, HCT, PLT in the last 72 hours.  Recent Labs    04/06/21 0549  NA 131*  K 4.2  CL 98  CO2 24  GLUCOSE 96  BUN 10  CREATININE 0.58  CALCIUM 8.8*    Intake/Output Summary (Last 24 hours) at 04/06/2021 1551 Last data filed at 04/06/2021 1500 Gross per 24 hour  Intake 440 ml  Output --  Net 440 ml        Physical Exam: Vital Signs Blood pressure (!) 138/53, pulse 65, temperature 98.3 F (36.8 C), temperature source Oral, resp. rate 18, height 5' 1.5" (1.562 m), weight 46.3 kg, SpO2 98 %. Gen: no distress, normal appearing HEENT: oral mucosa pink and moist, NCAT Cardio: Reg rate Chest: normal effort, normal rate of breathing Abd: soft, non-distended Extremities: No clubbing, cyanosis, or edema. Pulses are 2+ Psych: Pt's affect is appropriate. Pt is cooperative Skin: Clean and intact without signs of breakdown- L Hip incision C/D/I- also IV L forearm- a little bleeding seen- needs to be remov Neuro: Pt is cognitively appropriate with normal insight, memory, and awareness. Cranial nerves 2-12 are intact. Decreased LT/PP from mid calves distally. Reflexes are 1+ in all 4's. Fine motor coordination is intact. No tremors. Motor function is grossly 5/5 UE, 4/5 RLE, and 2-4/5 LLE prox to distal d/t pain..  Musculoskeletal: left hip with swelling. Tender along operative site and with PROM. Right SCM slightly tender with movement   Assessment/Plan: 1. Functional deficits which require 3+ hours per day of interdisciplinary therapy in a comprehensive inpatient rehab setting. Physiatrist is  providing close team supervision and 24 hour management of active medical problems listed below. Physiatrist and rehab team continue to assess barriers to discharge/monitor patient progress toward functional and medical goals  Care Tool:  Bathing    Body parts bathed by patient: Right arm, Left arm, Chest, Abdomen, Front perineal area, Buttocks, Right upper leg, Left upper leg, Right lower leg, Left lower leg, Face   Body parts bathed by helper: Left lower leg     Bathing assist Assist Level: Minimal Assistance - Patient > 75%     Upper Body Dressing/Undressing Upper body dressing   What is the patient wearing?: Bra, Pull over shirt    Upper body assist Assist Level: Minimal Assistance - Patient > 75%    Lower Body Dressing/Undressing Lower body dressing      What is the patient wearing?: Underwear/pull up     Lower body assist Assist for lower body dressing: Minimal Assistance - Patient > 75%     Toileting Toileting    Toileting assist Assist for toileting: Contact Guard/Touching assist     Transfers Chair/bed transfer  Transfers assist     Chair/bed transfer assist level: Contact Guard/Touching assist     Locomotion Ambulation   Ambulation assist  Assist level: Contact Guard/Touching assist Assistive device: Walker-rolling Max distance: 145   Walk 10 feet activity   Assist     Assist level: Contact Guard/Touching assist Assistive device: Walker-rolling   Walk 50 feet activity   Assist    Assist level: Contact Guard/Touching assist Assistive device: Walker-rolling    Walk 150 feet activity   Assist Walk 150 feet activity did not occur: Safety/medical concerns         Walk 10 feet on uneven surface  activity   Assist     Assist level: Minimal Assistance - Patient > 75% Assistive device: Aeronautical engineer Will patient use wheelchair at discharge?: No Type of Wheelchair: Manual    Wheelchair  assist level: Supervision/Verbal cueing Max wheelchair distance: 44'    Wheelchair 50 feet with 2 turns activity    Assist        Assist Level: Supervision/Verbal cueing   Wheelchair 150 feet activity     Assist  Wheelchair 150 feet activity did not occur: Safety/medical concerns       Blood pressure (!) 138/53, pulse 65, temperature 98.3 F (36.8 C), temperature source Oral, resp. rate 18, height 5' 1.5" (1.562 m), weight 46.3 kg, SpO2 98 %.  Medical Problem List and Plan: 1.  Impaired gait and function secondary L femur fx s/p percutaneous pinning- WBAT             -patient may  shower             -ELOS/Goals: 2-3 weeks- supervision to mod I  Continue PT and OT- WBAT on LLE 2.  Antithrombotics: -DVT/anticoagulation:  Continue Eliquis  -dopplers negative             -antiplatelet therapy:  N/A 3. Pain Management: Continue hydrocodone prn severe pain             --primarily using tramadol   -tylenol prn as well  -ice prn  6/12- pt taking tramadol- last 2 hrs ago- controlled at rest at least- con't regimen 4. Mood: team to provide ego support  -Depression: on zoloft. Mood up beat             -antipsychotic agents: N/A 5. Neuropsych: This patient is capable of making decisions on hee own behalf. 6. Skin/Wound Care: Routine pressure relief measures.             --Monitor incision for healing. 7. Fluids/Electrolytes/Nutrition: encourage PO -I personally reviewed all of the patient's labs today, and lab work is within normal limits except for sodium which is improved at 132 and low albumin             -protein supp for low albumin  -recheck labs Monday 8. PAF: Monitor HR TID--continue Pacerone and Eliquis. Regular rhythm today  6/11- rhythm regular today- con't regimen  -observe for tolerance with therapies 9. CAD s/p CABG/chronic diastolic CHF: Will check weights daily and monitor for signs of overload. Continue Lasix.             --heart healthy diet.     Filed  Weights   04/02/21 1449 04/03/21 0500 04/05/21 0600  Weight: 45.7 kg 43.7 kg 46.3 kg    6/11- no weight today- will make sure has daily weights. 6/12- Weight up to 46.3 kg- 3 kg, however 2 days ago- is stable from then- con't to monitor  10 HTN: SBP elevated and DBP soft: continue Lasix and Norvasc. 11. H/o COPD/DOE:  Monitor  for symptoms with increase in activity. 12. RLS/Chronic insomnia: On melatonin--takes 5-10 mg prn at home.   13. Idiopathic peripheral polyneuropathy: Reports that legs are numb/does not have sensation to urinate but voiding without difficulty. --has seen Dr. [name] in the past.   14. Bones: begin calcium and vitamin D supplements today   6/11- pt insistent we need to speak with Cardiology before she will take [place]- was told she would get clots with Calcium?We can call Cards/check Monday?   15. Loose stools  6/11- having 3 BM's per day- will decrease senokot to 1x/day.   6/12- pt said still soft- asked her to try this dose for a couple of days. 16. IV LUE- will remove- not using and been in 1 week.   LOS: 4 days A FACE TO FACE EVALUATION WAS PERFORMED  Clide Deutscher Kennie Snedden 04/06/2021, 3:51 PM

## 2021-04-06 NOTE — Progress Notes (Signed)
Occupational Therapy Session Note  Patient Details  Name: Maria [name] MRN: [id number] Date of Birth: [birth year]  Today's Date: 04/06/2021 OT Individual Time: 0801-0900 and 3299-2426 OT Individual Time Calculation (min): 59 min and 63 min   Short Term Goals: Week 1:  OT Short Term Goal 1 (Week 1): Patient will complete LB bathing/dressing with close spvsn using AE PRN. OT Short Term Goal 2 (Week 1): Patient will complete 3/3 toileting tasks with close spvsn and LRAD. OT Short Term Goal 3 (Week 1): Patient will complete toilet transfer with close spvsn and LRAD. OT Short Term Goal 4 (Week 1): Patient will complete dynamic standing ADL using LRAD and no overt LOB reaching outside BOS. OT Short Term Goal 5 (Week 1): Patient will identify 3 fall prevention strategies to improve safety at home with no cueing.  Skilled Therapeutic Interventions/Progress Updates:    Session 1: Pt received supine in bed finishing breakfast, agreeable to OT session, reporting no pain at start of session. Session focused on BADLs and simulating home environment for showering to practice functional transfers to increase safety at home. Pt declined shower but encouraged to participate in future session. Pt completed bed mobility with close spvsn and vc's for technique. Sit<>stands and ambulation across room with RW at [facility] with min A required 1x due to slight LOB with self correction. Vc's required throughout for body mechanics and safe use of RW in functional tasks as pt frequently did not stand with RW close when completing dynamic standing/reaching tasks. Pt retrieved clothing for day at ambulation level and provided education on safe use and tips to hold items on walker instead of in hands. UB dressing min A to fasten bra; provided education on adaptive methods to don bra, but pt declining. Declined LB dressing. Engaged in grooming tasks seated in w/c due to fatigue at sink with set up A. Pt education on TTB, AE, and  shower transfer techniques; pt demo'd shower transfer with CGA and vc's for body mechanics and use of grab bars. Pt remained seated EOB with PT entering session, all needs met.   Session 2: Pt received supine in bed, agreeable to OT session, reporting pain 5/10 throughout session with slight increase during standing level tasks. Pt education and discussion of POC and d/c recommendations with encouragement to talk with SW to discuss bed placement at ILF/SNF. Sup<>sit close spvsn. Sit<>stands with CGA and vc's for safe use of RW and hand placement; improving throughout session. Stand step transfer bed>w/c with no AD using arm rests and bed rails with CGA. Squat pivot w/c>therapy mat close spvsn. Pt engaged in conditioning activities targeting BUE and  core strengthening, activity tolerance, and postural control to improve tolerance to OOB activities and increase independence in BADLs/functional mobility. Pt completed dowel rod activities including volleyball toss with 2 and 4lb dowel rod 10x each; chest press 10x, and bilateral rows 10x. 10x modified sit ups with wedge behind while tossing ball overhead. 20x bilateral hip flx seated. Pt engaged in dynamic standing activity with RW involving reaching outside BOS to low and high surfaces with no overt LOB and vc's throughout for technique and safety. Pt reported increased pain following activity and requested/provided with hot pack. Pt remained supine in bed with hot pack applied to L hip, alarm active, and all needs met.  Therapy Documentation Precautions:  Precautions Precautions: Fall Restrictions Weight Bearing Restrictions: Yes LLE Weight Bearing: Weight bearing as tolerated  Pain: Pain Assessment Pain Scale: 0-10 Pain Score: 5  Pain Type: Acute pain Pain Location: Hip Pain Orientation: Left Pain Descriptors / Indicators: Aching Pain Onset: On-going Pain Intervention(s): Repositioned;Rest;Distraction Multiple Pain Sites: No   Therapy/Group:  Individual Therapy  JAMERIAH TROTTI 04/06/2021, 12:40 PM

## 2021-04-07 NOTE — Patient Care Conference (Signed)
Inpatient RehabilitationTeam Conference and Plan of Care Update Date: 04/07/2021   Time: 12:12 PM    Patient Name: Maria [name]      Medical Record Number: [id number]  Date of Birth: 14-Mar-1924 Sex: Female         Room/Bed: 4W26C/4W26C-01 Payor Info: Payor: MEDICARE / Plan: MEDICARE PART A AND B / Product Type: *No Product type* /    Admit Date/Time:  04/02/2021  2:34 PM  Primary Diagnosis:  Femur fracture, left [facility])  Hospital Problems: Principal Problem:   Femur fracture, left ([place]) Active Problems:   Malnutrition of moderate degree    Expected Discharge Date: Expected Discharge Date: 04/15/21  Team Members Present: Physician leading conference: Dr. Courtney Heys Care Coodinator Present: Dorien Chihuahua, RN, BSN, CRRN;Becky Dupree, LCSW Nurse Present: Dorien Chihuahua, RN PT Present: Magda Kiel, PT OT Present: Other (comment) Providence Lanius, OT) [place] Coordinator present : Ileana Ladd, PT     Current Status/Progress Goal Weekly Team Focus  Bowel/Bladder   Pt is continent of B/B daily senna dulcolax senna prn LBM 06/13  remain continent with normal bowel pattern free of constipation  toilet prn monitor for constipation   Swallow/Nutrition/ Hydration             ADL's   CGA transfers, close spvsn bed mobility, UB adls set up-min A; LB adls CGA-min A; limited by decreased balance, endurance, pain, and safety for d/c to ILF  spvsn-mod I  adl and transfer training, home safety/fall prevention, general conditioning, endurance and tolerance to OOB   Mobility   CGA for tranasfers, and close S to CGA for ambulation with RW u pto 180', min A sit to supine for L LE and negotiates 1 step with RW and min A, min A car transfers  S overall  balance, gait, endurance, L LE strength and tolerance to weight bearing   Communication             Safety/Cognition/ Behavioral Observations            Pain   pt denies pain tonight  does take during the day tylenol norco and ultram prn  pt  will be free of pain  Assess need for analgesic qshift and prior to therapies   Skin   Left hip surgical incision with bruises  pt will be free of infection or skin breakdown  assess skin qshift and prn     Discharge Planning:  Pt considering going to the rehab part of [place], discussing with children. Lives in independent home [place] property. Awaiting return call from [facility] coordinator   Team Discussion: Pain controlled. Monitoring daily weights. Foam to incision. Patient on target to meet rehab goals: yes, currently close supervision for mobility and ADLs.  CGA for transfers and close supervision for ambulation 150'. Min assist for car transfers.   *See Care Plan and progress notes for long and short-term goals.   Revisions to Treatment Plan:  Working on balance, endurance, weight bearing, left lower extremity advancement, consistency, etc  Teaching Needs: Transfers, toileting, skin care, medications, safety tips, etc.  Current Barriers to Discharge: Decreased caregiver support and Home enviroment access/layout Resides in home on [address]; looking at short term SNF vs home  Possible Resolutions to Barriers: Family education     Medical Summary Current Status: WBAT- LLE- continent B/B; melatonin for sleep; tramadol prn- forgetting what ordered for dinner- sending non gluten free items from  Barriers to Discharge: Decreased family/caregiver support;Insurance  for SNF coverage;Weight;Weight bearing restrictions;Home enviroment access/layout  Barriers to Discharge Comments: going to skilled nursing area of her independent living area/house- [place] Possible Resolutions to [employer] Focus: balance and endurance issues; focus- on memory and pain control this week - has lump on back of R occiput- will monitor- no Calcium due to her request/per her cardiologist.  D/C- SNF vs home- as long as there's a bed- 6/22   Continued Need for Acute Rehabilitation  Level of Care: The patient requires daily medical management by a physician with specialized training in physical medicine and rehabilitation for the following reasons: Direction of a multidisciplinary physical rehabilitation program to maximize functional independence : Yes Medical management of patient stability for increased activity during participation in an intensive rehabilitation regime.: Yes Analysis of laboratory values and/or radiology reports with any subsequent need for medication adjustment and/or medical intervention. : Yes   I attest that I was present, lead the team conference, and concur with the assessment and plan of the team.   Dorien Chihuahua B 04/07/2021, 2:59 PM

## 2021-04-07 NOTE — Progress Notes (Signed)
Pt states that she slept well last night. Would like her tramadol around 0715 prior to therapy writer told her that I will bring it after report.

## 2021-04-07 NOTE — Progress Notes (Signed)
Physical Therapy Session Note  Patient Details  Name: Maria [name] MRN: [id number] Date of Birth: 24-Oct-1924  Today's Date: 04/07/2021 PT Individual Time: [id number]; Arthor Captain: [id number] PT Individual Time Calculation (min): 30 min & 66 min   Short Term Goals: Week 1:  PT Short Term Goal 1 (Week 1): STG=LTG due to ELOS  Skilled Therapeutic Interventions/Progress Updates:    Session1: Patient seated EOB with OT in the room.  Reports she did not eat dinner last night due to issues with her diet.  Hostess in room to get pt's lunch order.  Patient sit to stand to RW with CGA/S.  Assisted in w/c to dayroom. Patient transferred to Nu Step using RW stand step with CGA.  Performed UE/LE @ level 1 x 6 minutes with cues for shorter ROM on L for pain management.  Patient sit to stand with CGA to RW and ambulated x 160' to her room with CGA to S.  Patient seated in recliner and feet elevated on trash can with pillow as reports footrest  elevates too much.  Patient left with call bell and needs in reach.  [place]:  Patient in recliner and reports continued diet issues with the kitchen and gluten free foods.  Patient performed sit to stand to RW with CGA.  Patient ambulated 120' with RW and CGA till L knee buckled so sat in w/c to get to therapy gym.  Performed standing balance activities including matching playing cards on back of mirror reaching to retrieve cards then to place them with intermittent UE support and close S.  Ambulated 42' with RW and CGA.  Performed seated ball toss and catch for core strength.  Sit to stand with CGA and performed standing balance task giving and getting ball rotating R & L with one LOB min A to recover.  Patient negotiated curb step with RW and min A with cues for sequence/technique.  Assisted in w/c to room.  Patient standing at closet to check clothing and noted what might be needed since her daughter leaving for the beach for a week.  States she wheeled around in  her w/c with safety belt on recently propelling with her feet to check items and organize with nursing aware.  Patient's daughter, Maria [name], arrived and brought extra clothing.  Patient eager to visit since she will not see her for a week.  Ambulated 30' to recliner with RW and CGA.  Requesting to visit with daughter since she will be out of town for a week.  Seated in recliner with makeshift foot rest and call bell/needs in reach and daughter in the room. Missed 9 minutes skilled PT.   Therapy Documentation Precautions:  Precautions Precautions: Fall Restrictions Weight Bearing Restrictions: Yes LLE Weight Bearing: Weight bearing as tolerated General: PT Amount of Missed Time (min): 9 Minutes PT Missed Treatment Reason: Other (Comment) (daughter here and pt wants to visit since she will be out of town)  Pain: Pain Assessment Pain Scale: 0-10 Pain Score: 4  Pain Type: Acute pain Pain Location: Hip Pain Orientation: Left Pain Descriptors / Indicators: Aching Pain Frequency: Intermittent Pain Onset: On-going Patients Stated Pain Goal: 0 Pain Intervention(s): Distraction;Rest Multiple Pain Sites: No    Therapy/Group: Individual Therapy  [name] [place], [place] 04/07/2021, 1:29 PM

## 2021-04-07 NOTE — NC FL2 (Signed)
[place] MEDICAID FL2 LEVEL OF CARE SCREENING TOOL     IDENTIFICATION  Patient Name: Maria [name] Birthdate: Apr 17, 1924 Sex: female Admission Date (Current Location): 04/02/2021  [facility] and [place] Number:  Producer, television/film/video and Address:  [facility], [address], [place], [place] [address]      Provider Number: [id number]  Attending Physician Name and Address:  Ranelle Oyster, MD  Relative Name and Phone Number:  Darryl Lent [phone]    Current Level of Care: Other (Comment) (Rehab) Recommended Level of Care: Skilled Nursing Facility Prior Approval Number:    Date Approved/Denied:   PASRR Number: [id number]  Discharge Plan: SNF    Current Diagnoses: Patient Active Problem List   Diagnosis Date Noted   Malnutrition of moderate degree 04/04/2021   Femur fracture, left (HCC) 04/02/2021   Fracture    Closed left hip fracture, initial encounter (HCC) 03/28/2021   Celiac disease 03/28/2021   DNR (do not resuscitate) 03/28/2021   Left hand weakness 11/29/2018   Dehydration with hyponatremia 11/29/2018   CVA (cerebral vascular accident) (HCC) 11/26/2018   Chest pain 11/26/2018   On amiodarone therapy 07/13/2017   Frailty 07/13/2017   CKD (chronic kidney disease), stage III (HCC)    Hypothyroidism 10/18/2014   Chronic anticoagulation CHADS2VASC2 score 7 10/18/2014   Right leg pain 07/22/2014   Gout of foot 04/10/2014   Atonic bladder 12/11/2013   Hyponatremia 12/08/2013   Closed right hip fracture (HCC) 12/07/2013   Protein-calorie malnutrition, severe (HCC) 10/04/2013   DVT (deep venous thrombosis) (HCC) 10/03/2013   Coronary artery disease involving coronary bypass graft of native heart with angina pectoris (HCC) 09/12/2013   Mitral regurgitation 08/07/2013   Pulmonary hypertension, moderate to severe (HCC) 08/07/2013   Essential hypertension, benign 08/02/2013   Nontoxic uninodular goiter 08/02/2013    Unspecified hereditary and idiopathic peripheral neuropathy 08/02/2013   Carotid disease, bilateral (HCC) 08/02/2013   Unspecified vitamin D deficiency 08/02/2013   Depressive disorder, not elsewhere classified 08/02/2013   Paroxysmal atrial fibrillation (HCC) 08/02/2013   Trifascicular block 08/02/2013   Gastric ulcer, unspecified as acute or chronic, without mention of hemorrhage, perforation, or obstruction 08/02/2013   Sinoatrial node dysfunction (HCC) 08/02/2013    Orientation RESPIRATION BLADDER Height & Weight     Self, Time, Situation, Place  Normal Continent Weight: 102 lb 1.2 oz (46.3 kg) Height:  5' 1.5" (156.2 cm)  BEHAVIORAL SYMPTOMS/MOOD NEUROLOGICAL BOWEL NUTRITION STATUS      Continent Diet (Gluten Free-thin liquids)  AMBULATORY STATUS COMMUNICATION OF NEEDS Skin   Limited Assist Verbally Surgical wounds                       Personal Care Assistance Level of Assistance  Bathing, Dressing Bathing Assistance: Limited assistance   Dressing Assistance: Limited assistance     Functional Limitations Info             SPECIAL CARE FACTORS FREQUENCY  PT (By licensed PT), OT (By licensed OT)     PT Frequency: 5x week OT Frequency: 5x week            Contractures Contractures Info: Not present    Additional Factors Info  Code Status, Allergies Code Status Info: DNR Allergies Info: Fentanyl, Atorvastatin, Bupropion, Gabepentin, Amoxicillin, Codeine, Fosamax, Gluten Meal Hydrochlorothiazide           Current Medications (04/07/2021):  This is the current hospital active medication list Current Facility-Administered Medications  Medication Dose Route Frequency Provider Last Rate Last Admin   acetaminophen (TYLENOL) tablet 325-650 mg  325-650 mg Oral Q4H PRN Jacquelynn Cree, PA-C   650 mg at 04/05/21 1026   alum & mag hydroxide-simeth (MAALOX/MYLANTA) 200-200-20 MG/5ML suspension 30 mL  30 mL Oral Q4H PRN Love, Pamela S, PA-C       amiodarone  (PACERONE) tablet 100 mg  100 mg Oral QHS Love, Pamela S, PA-C   100 mg at 04/06/21 2015   amLODipine (NORVASC) tablet 2.5 mg  2.5 mg Oral Daily Jacquelynn Cree, PA-C   2.5 mg at 04/07/21 0719   apixaban (ELIQUIS) tablet 2.5 mg  2.5 mg Oral BID Delle Reining S, PA-C   2.5 mg at 04/07/21 0719   bisacodyl (DULCOLAX) suppository 10 mg  10 mg Rectal Daily PRN Jacquelynn Cree, PA-C       cholecalciferol (VITAMIN D3) tablet 400 Units  400 Units Oral Daily Ranelle Oyster, MD   400 Units at 04/07/21 0720   diphenhydrAMINE (BENADRYL) 12.5 MG/5ML elixir 12.5-25 mg  12.5-25 mg Oral Q6H PRN Love, Pamela S, PA-C       famotidine (PEPCID) tablet 10 mg  10 mg Oral BID Love, Pamela S, PA-C   10 mg at 04/07/21 0719   feeding supplement (ENSURE ENLIVE / ENSURE PLUS) liquid 237 mL  237 mL Oral BID BM Ranelle Oyster, MD   237 mL at 04/07/21 1034   ferrous sulfate tablet 325 mg  325 mg Oral Q breakfast Jacquelynn Cree, PA-C   325 mg at 04/07/21 0720   furosemide (LASIX) tablet 20 mg  20 mg Oral Daily Jacquelynn Cree, PA-C   20 mg at 04/07/21 0720   guaiFENesin-dextromethorphan (ROBITUSSIN DM) 100-10 MG/5ML syrup 5-10 mL  5-10 mL Oral Q6H PRN Love, Pamela S, PA-C       HYDROcodone-acetaminophen (NORCO/VICODIN) 5-325 MG per tablet 1-2 tablet  1-2 tablet Oral Q4H PRN Love, Pamela S, PA-C       levothyroxine (SYNTHROID) tablet 25 mcg  25 mcg Oral QAC breakfast Jacquelynn Cree, [place]   25 mcg at 04/07/21 0545   melatonin tablet 5-10 mg  5-10 mg Oral QHS Love, Pamela S, PA-C   10 mg at 04/06/21 2021   menthol-cetylpyridinium (CEPACOL) lozenge 3 mg  1 lozenge Oral PRN Love, Pamela S, PA-C       Or   phenol (CHLORASEPTIC) mouth spray 1 spray  1 spray Mouth/Throat PRN Love, Pamela S, PA-C       multivitamin with minerals tablet 1 tablet  1 tablet Oral Daily Jacquelynn Cree, PA-C   1 tablet at 04/07/21 2355   polyethylene glycol (MIRALAX / GLYCOLAX) packet 17 g  17 g Oral Daily PRN Love, Pamela S, PA-C       prochlorperazine  (COMPAZINE) tablet 5-10 mg  5-10 mg Oral Q6H PRN Love, Pamela S, PA-C       Or   prochlorperazine (COMPAZINE) injection 5-10 mg  5-10 mg Intramuscular Q6H PRN Love, Pamela S, PA-C       Or   prochlorperazine (COMPAZINE) suppository 12.5 mg  12.5 mg Rectal Q6H PRN Love, Pamela S, PA-C       senna-docusate (Senokot-S) tablet 1 tablet  1 tablet Oral Daily Lovorn, Megan, MD   1 tablet at 04/07/21 0719   sertraline (ZOLOFT) tablet 50 mg  50 mg Oral Daily Jacquelynn Cree, PA-C   50 mg at 04/07/21 0719   sodium  phosphate (FLEET) 7-19 GM/118ML enema 1 enema  1 enema Rectal Once PRN Love, Pamela S, PA-C       traMADol (ULTRAM) tablet 50 mg  50 mg Oral Q6H PRN Love, Pamela S, PA-C   50 mg at 04/07/21 0719     Discharge Medications: Please see discharge summary for a list of discharge medications.  Relevant Imaging Results:  Relevant Lab Results:   Additional Information SSN: [id number] Has had COVID vaccinations  Fairley Copher, Lemar Livings, LCSW

## 2021-04-07 NOTE — Progress Notes (Signed)
Occupational Therapy Session Note  Patient Details  Name: Maria [name] MRN: [id number] Date of Birth: May 10, 1924  Today's Date: 04/07/2021 OT Individual Time: 0757-0900 OT Individual Time Calculation (min): 63 min    Short Term Goals: Week 1:  OT Short Term Goal 1 (Week 1): Patient will complete LB bathing/dressing with close spvsn using AE PRN. OT Short Term Goal 2 (Week 1): Patient will complete 3/3 toileting tasks with close spvsn and LRAD. OT Short Term Goal 3 (Week 1): Patient will complete toilet transfer with close spvsn and LRAD. OT Short Term Goal 4 (Week 1): Patient will complete dynamic standing ADL using LRAD and no overt LOB reaching outside BOS. OT Short Term Goal 5 (Week 1): Patient will identify 3 fall prevention strategies to improve safety at home with no cueing.  Skilled Therapeutic Interventions/Progress Updates:    Patient in bed, alert and ready for therapy session.  She notes that pain is currently under control.   Supine to sitting edge of bed with CS.  Sit to stand and ambulation with RW to/from bed, toilet, w/c with CS/CGA.   Toileting completed with CS/set up.  She is able to gather items needed for adl tasks with CS using RW and min cues for safety and hand placement.  Grooming tasks in stance with CS.  Dressing tasks seated and in stance with CS.  Footwear:  min A for left slipper sock.   Completed standing trunk mobility and weight shift activities with CS.  She remained seated in w/c with hand off to [facility] for continued OT session.    Therapy Documentation Precautions:  Precautions Precautions: Fall Restrictions Weight Bearing Restrictions: Yes LLE Weight Bearing: Weight bearing as tolerated   Therapy/Group: Individual Therapy  Carlos Levering 04/07/2021, 7:29 AM

## 2021-04-07 NOTE — Progress Notes (Signed)
PROGRESS NOTE   Subjective/Complaints:  Pt reports has a lump and sore on the back of her head on Right- thought hit the L side of head, not the right.   ROS:   Pt denies SOB, abd pain, CP, N/V/C/D, and vision changes   Objective:   No results found. No results for input(s): WBC, HGB, HCT, PLT in the last 72 hours.  Recent Labs    04/06/21 0549  NA 131*  K 4.2  CL 98  CO2 24  GLUCOSE 96  BUN 10  CREATININE 0.58  CALCIUM 8.8*    Intake/Output Summary (Last 24 hours) at 04/07/2021 0852 Last data filed at 04/06/2021 1817 Gross per 24 hour  Intake 320 ml  Output --  Net 320 ml        Physical Exam: Vital Signs Blood pressure (!) 151/64, pulse 68, temperature 98.4 F (36.9 C), temperature source Oral, resp. rate 18, height 5' 1.5" (1.562 m), weight 46.3 kg, SpO2 96 %.    General: awake, alert, appropriate, sitting up in chair with OT in room; putting on clothes; NAD HENT: conjugate gaze; oropharynx moist; Small lump not full goose-egg on Back of R posterior head/occipital- is TTP  CV: regular rate; no JVD Pulmonary: CTA B/L; no W/R/R- good air movement GI: soft, NT, ND, (+)BS Psychiatric: appropriate; interactive Neurological: alert; joking  Extremities: No clubbing, cyanosis, or edema. Pulses are 2+ Psych: Pt's affect is appropriate. Pt is cooperative Skin: Clean and intact without signs of breakdown- L Hip incision C/D/I- also IV L forearm- a little bleeding seen- needs to be remov Neuro: Pt is cognitively appropriate with normal insight, memory, and awareness. Cranial nerves 2-12 are intact. Decreased LT/PP from mid calves distally. Reflexes are 1+ in all 4's. Fine motor coordination is intact. No tremors. Motor function is grossly 5/5 UE, 4/5 RLE, and 2-4/5 LLE prox to distal d/t pain..  Musculoskeletal: left hip with swelling. Tender along operative site and with PROM. Right SCM slightly tender with  movement   Assessment/Plan: 1. Functional deficits which require 3+ hours per day of interdisciplinary therapy in a comprehensive inpatient rehab setting. Physiatrist is providing close team supervision and 24 hour management of active medical problems listed below. Physiatrist and rehab team continue to assess barriers to discharge/monitor patient progress toward functional and medical goals  Care Tool:  Bathing    Body parts bathed by patient: Right arm, Left arm, Chest, Abdomen, Front perineal area, Buttocks, Right upper leg, Left upper leg, Right lower leg, Left lower leg, Face   Body parts bathed by helper: Left lower leg     Bathing assist Assist Level: Minimal Assistance - Patient > 75%     Upper Body Dressing/Undressing Upper body dressing   What is the patient wearing?: Bra, Pull over shirt    Upper body assist Assist Level: Minimal Assistance - Patient > 75%    Lower Body Dressing/Undressing Lower body dressing      What is the patient wearing?: Underwear/pull up     Lower body assist Assist for lower body dressing: Minimal Assistance - Patient > 75%     Toileting Toileting    Toileting assist Assist  for toileting: Contact Guard/Touching assist     Transfers Chair/bed transfer  Transfers assist     Chair/bed transfer assist level: Contact Guard/Touching assist     Locomotion Ambulation   Ambulation assist      Assist level: Contact Guard/Touching assist Assistive device: Walker-rolling Max distance: 180'   Walk 10 feet activity   Assist     Assist level: Contact Guard/Touching assist Assistive device: Walker-rolling   Walk 50 feet activity   Assist    Assist level: Contact Guard/Touching assist Assistive device: Walker-rolling    Walk 150 feet activity   Assist Walk 150 feet activity did not occur: Safety/medical concerns  Assist level: Contact Guard/Touching assist Assistive device: Walker-rolling    Walk 10 feet on  uneven surface  activity   Assist     Assist level: Minimal Assistance - Patient > 75% Assistive device: Aeronautical engineer Will patient use wheelchair at discharge?: No Type of Wheelchair: Manual    Wheelchair assist level: Supervision/Verbal cueing Max wheelchair distance: 60'    Wheelchair 50 feet with 2 turns activity    Assist        Assist Level: Supervision/Verbal cueing   Wheelchair 150 feet activity     Assist  Wheelchair 150 feet activity did not occur: Safety/medical concerns       Blood pressure (!) 151/64, pulse 68, temperature 98.4 F (36.9 C), temperature source Oral, resp. rate 18, height 5' 1.5" (1.562 m), weight 46.3 kg, SpO2 96 %.  Medical Problem List and Plan: 1.  Impaired gait and function secondary L femur fx s/p percutaneous pinning- WBAT             -patient may  shower             -ELOS/Goals: 2-3 weeks- supervision to mod I  Con't PT and OT; WBAT on LLE 2.  Antithrombotics: -DVT/anticoagulation:  Continue Eliquis  -dopplers negative             -antiplatelet therapy:  N/A 3. Pain Management: Continue hydrocodone prn severe pain             --primarily using tramadol   -tylenol prn as well  -ice prn  6/14- Pain controlled except spot on back of head- is sore- con't tramadol prn 4. Mood: team to provide ego support  -Depression: on zoloft. Mood up beat             -antipsychotic agents: N/A 5. Neuropsych: This patient is capable of making decisions on hee own behalf. 6. Skin/Wound Care: Routine pressure relief measures.             --Monitor incision for healing. 7. Fluids/Electrolytes/Nutrition: encourage PO -I personally reviewed all of the patient's labs today, and lab work is within normal limits except for sodium which is improved at 132 and low albumin             -protein supp for low albumin  -recheck labs Monday 8. PAF: Monitor HR TID--continue Pacerone and Eliquis. Regular rhythm  today  6/11- rhythm regular today- con't regimen  -observe for tolerance with therapies 9. CAD s/p CABG/chronic diastolic CHF: Will check weights daily and monitor for signs of overload. Continue Lasix.             --heart healthy diet.     Filed Weights   04/02/21 1449 04/03/21 0500 04/05/21 0600  Weight: 45.7 kg 43.7 kg 46.3 kg    6/11- no  weight today- will make sure has daily weights. 6/12- Weight up to 46.3 kg- 3 kg, however 2 days ago- is stable from then- con't to monitor  10 HTN: SBP elevated and DBP soft: continue Lasix and Norvasc. 11. H/o COPD/DOE:  Monitor for symptoms with increase in activity. 12. RLS/Chronic insomnia: On melatonin--takes 5-10 mg prn at home.   13. Idiopathic peripheral polyneuropathy: Reports that legs are numb/does not have sensation to urinate but voiding without difficulty. --has seen Dr. [name] in the past.   14. Bones: begin calcium and vitamin D supplements today   6/11- pt insistent we need to speak with Cardiology before she will take [place]- was told she would get clots with Calcium?We can call Cards/check Monday?   6/14- will keep off Calcium- don't want to cause more problems with calcification of vessels?   15. Loose stools  6/11- having 3 BM's per day- will decrease senokot to 1x/day.   6/12- pt said still soft- asked her to try this dose for a couple of days. 16. IV LUE- will remove- not using and been in 1 week.   LOS: 5 days A FACE TO FACE EVALUATION WAS PERFORMED  Bowe Sidor 04/07/2021, 8:52 AM

## 2021-04-07 NOTE — Progress Notes (Signed)
Occupational Therapy Session Note  Patient Details  Name: Maria [name] MRN: [id number] Date of Birth: [birth year]  Today's Date: 04/07/2021 OT Individual Time: 4975-3005 OT Individual Time Calculation (min): 31 min    Short Term Goals: Week 1:  OT Short Term Goal 1 (Week 1): Patient will complete LB bathing/dressing with close spvsn using AE PRN. OT Short Term Goal 2 (Week 1): Patient will complete 3/3 toileting tasks with close spvsn and LRAD. OT Short Term Goal 3 (Week 1): Patient will complete toilet transfer with close spvsn and LRAD. OT Short Term Goal 4 (Week 1): Patient will complete dynamic standing ADL using LRAD and no overt LOB reaching outside BOS. OT Short Term Goal 5 (Week 1): Patient will identify 3 fall prevention strategies to improve safety at home with no cueing.  Skilled Therapeutic Interventions/Progress Updates:    Pt received in gym completing previous OT session in w/c, reporting improved pain from previous days. Pt taken to apartment and session focused on home management skills with safe use of DME and fall prevention strategies for safe d/c home. Pt engaged in ambulatory task with RW requiring CGA to navigate kitchen and collect 10 items from high and low cabinets; vc's for body mechanics and safe use of RW when reaching outside BOS to improve dynamic standing balance tolerance and safety. Pt demo'd no overt LOB during task and able to complete with ~10 minutes of standing/walking before seated rest break. Pt completed light housekeeping tasks at ambulatory level reaching outside BOS with CGA in room. Sit<>stands with RW close spvsn with improved carryover of hand placement strategies. Pt reported pain in left hip following ambulatory iADL tasks but improved from prior days. Remained in w/c with PT entering for following session, all needs met.   Therapy Documentation Precautions:  Precautions Precautions: Fall Restrictions Weight Bearing Restrictions: Yes LLE  Weight Bearing: Weight bearing as tolerated  Pain: Pain Assessment Pain Scale: 0-10 Pain Score: 4  Pain Type: Acute pain Pain Location: Hip Pain Orientation: Left Pain Descriptors / Indicators: Aching Pain Frequency: Intermittent Pain Onset: On-going Patients Stated Pain Goal: 0 Pain Intervention(s): Distraction;Rest Multiple Pain Sites: No   Therapy/Group: Individual Therapy  JEANICE DEMPSEY 04/07/2021, 12:53 PM

## 2021-04-08 MED ORDER — TRAMADOL HCL 50 MG PO TABS
50.0000 mg | ORAL_TABLET | Freq: Four times a day (QID) | ORAL | Status: DC
  Administered 2021-04-08 – 2021-04-09 (×7): 50 mg via ORAL
  Filled 2021-04-08 (×8): qty 1

## 2021-04-08 MED ORDER — OXYCODONE HCL 5 MG PO TABS
2.5000 mg | ORAL_TABLET | Freq: Four times a day (QID) | ORAL | Status: DC | PRN
Start: 2021-04-08 — End: 2021-04-10
  Administered 2021-04-09: 2.5 mg via ORAL
  Filled 2021-04-08: qty 1

## 2021-04-08 NOTE — Progress Notes (Signed)
PROGRESS NOTE   Subjective/Complaints:   Pt reports pain is 7-8/10- is just isn't well controlled- didn't tolerate norco well- is willing to try oxy again and put something in her stomach- LBM this AM around 5:30am.  In bathroom.   We discussed at length options to control pain- decided to schedule tramadol 50 mg q6 hours and add Oxy 2.5 mg q4-6 hours prn.   ROS:    Pt denies SOB, abd pain, CP, N/V/C/D, and vision changes    Objective:   No results found. No results for input(s): WBC, HGB, HCT, PLT in the last 72 hours.  Recent Labs    04/06/21 0549  NA 131*  K 4.2  CL 98  CO2 24  GLUCOSE 96  BUN 10  CREATININE 0.58  CALCIUM 8.8*    Intake/Output Summary (Last 24 hours) at 04/08/2021 1556 Last data filed at 04/08/2021 1300 Gross per 24 hour  Intake 560 ml  Output --  Net 560 ml        Physical Exam: Vital Signs Blood pressure 114/61, pulse 64, temperature 97.8 F (36.6 C), temperature source Oral, resp. rate 20, height 5' 1.5" (1.562 m), weight 43 kg, SpO2 97 %.     General: awake, alert, appropriate, sitting up in w/c- NAD HENT: conjugate gaze; oropharynx moist- no facial weakness noted CV: regular rate; no JVD Pulmonary: CTA B/L; no W/R/R- good air movement GI: soft, NT, ND, (+)BS Psychiatric: appropriate- very interactive Neurological: alert  Extremities: No clubbing, cyanosis, or edema. Pulses are 2+ Psych: Pt's affect is appropriate. Pt is cooperative Skin: Clean and intact without signs of breakdown- L Hip incision C/D/I- also IV L forearm- a little bleeding seen- looks good- mild edema associated Neuro: Pt is cognitively appropriate with normal insight, memory, and awareness. Cranial nerves 2-12 are intact. Decreased LT/PP from mid calves distally. Reflexes are 1+ in all 4's. Fine motor coordination is intact. No tremors. Motor function is grossly 5/5 UE, 4/5 RLE, and 2-4/5 LLE prox to  distal d/t pain..  Musculoskeletal: left hip with swelling. Tender along operative site and with PROM. Right SCM slightly tender with movement   Assessment/Plan: 1. Functional deficits which require 3+ hours per day of interdisciplinary therapy in a comprehensive inpatient rehab setting. Physiatrist is providing close team supervision and 24 hour management of active medical problems listed below. Physiatrist and rehab team continue to assess barriers to discharge/monitor patient progress toward functional and medical goals  Care Tool:  Bathing    Body parts bathed by patient: Right arm, Left arm, Chest, Abdomen, Front perineal area, Buttocks, Right upper leg, Left upper leg, Right lower leg, Left lower leg, Face   Body parts bathed by helper: Left lower leg     Bathing assist Assist Level: Contact Guard/Touching assist     Upper Body Dressing/Undressing Upper body dressing   What is the patient wearing?: Bra, Pull over shirt    Upper body assist Assist Level: Minimal Assistance - Patient > 75% Assistive Device Comment: min A to fasten bra  Lower Body Dressing/Undressing Lower body dressing      What is the patient wearing?: Underwear/pull up, Pants  Lower body assist Assist for lower body dressing: Supervision/Verbal cueing     Toileting Toileting    Toileting assist Assist for toileting: Supervision/Verbal cueing     Transfers Chair/bed transfer  Transfers assist     Chair/bed transfer assist level: Minimal Assistance - Patient > 75% (no AD)     Locomotion Ambulation   Ambulation assist      Assist level: Contact Guard/Touching assist Assistive device: Walker-rolling Max distance: 180'   Walk 10 feet activity   Assist     Assist level: Contact Guard/Touching assist Assistive device: Walker-rolling   Walk 50 feet activity   Assist    Assist level: Contact Guard/Touching assist Assistive device: Walker-rolling    Walk 150 feet  activity   Assist Walk 150 feet activity did not occur: Safety/medical concerns  Assist level: Contact Guard/Touching assist Assistive device: Walker-rolling    Walk 10 feet on uneven surface  activity   Assist     Assist level: Minimal Assistance - Patient > 75% Assistive device: Aeronautical engineer Will patient use wheelchair at discharge?: No Type of Wheelchair: Manual    Wheelchair assist level: Supervision/Verbal cueing Max wheelchair distance: 60'    Wheelchair 50 feet with 2 turns activity    Assist        Assist Level: Supervision/Verbal cueing   Wheelchair 150 feet activity     Assist  Wheelchair 150 feet activity did not occur: Safety/medical concerns       Blood pressure 114/61, pulse 64, temperature 97.8 F (36.6 C), temperature source Oral, resp. rate 20, height 5' 1.5" (1.562 m), weight 43 kg, SpO2 97 %.  Medical Problem List and Plan: 1.  Impaired gait and function secondary L femur fx s/p percutaneous pinning- WBAT             -patient may  shower             -ELOS/Goals: 2-3 weeks- supervision to mod I  Con't PT and OT/CIR- WBAT on LLE 2.  Antithrombotics: -DVT/anticoagulation:  Continue Eliquis  -dopplers negative             -antiplatelet therapy:  N/A 3. Pain Management: Continue hydrocodone prn severe pain             --primarily using tramadol   -tylenol prn as well  -ice prn  6/14- Pain controlled except spot on back of head- is sore- con't tramadol prn  6/15- pt rating pain 7-8/10- will schedule tramadol and add oxy prn 2.5 mg q6 hours prn- also d/c norco  4. Mood: team to provide ego support  -Depression: on zoloft. Mood up-beat  6/15- joking some- but can tell she's in pain- con't regimen             -antipsychotic agents: N/A 5. Neuropsych: This patient is capable of making decisions on hee own behalf. 6. Skin/Wound Care: Routine pressure relief measures.             --Monitor incision for  healing. 7. Fluids/Electrolytes/Nutrition: encourage PO -I personally reviewed all of the patient's labs today, and lab work is within normal limits except for sodium which is improved at 132 and low albumin             -protein supp for low albumin  -recheck labs Monday 8. PAF: Monitor HR TID--continue Pacerone and Eliquis. Regular rhythm today  6/15 - in RRR- con't regimen  -observe for tolerance with therapies 9. CAD  s/p CABG/chronic diastolic CHF: Will check weights daily and monitor for signs of overload. Continue Lasix.             --heart healthy diet.     Filed Weights   04/03/21 0500 04/05/21 0600 04/08/21 0500  Weight: 43.7 kg 46.3 kg 43 kg    6/15- weight slightly variable, but overall stable- con't to monitor 10 HTN: SBP elevated and DBP soft: continue Lasix and Norvasc. 11. H/o COPD/DOE:  Monitor for symptoms with increase in activity. 12. RLS/Chronic insomnia: On melatonin--takes 5-10 mg prn at home.   13. Idiopathic peripheral polyneuropathy: Reports that legs are numb/does not have sensation to urinate but voiding without difficulty. --has seen Dr. [name] in the past.   14. Bones: begin calcium and vitamin D supplements today   6/11- pt insistent we need to speak with Cardiology before she will take [place]- was told she would get clots with Calcium?We can call Cards/check Monday?   6/14- will keep off Calcium- don't want to cause more problems with calcification of vessels?   15. Loose stools  6/11- having 3 BM's per day- will decrease senokot to 1x/day.   6/12- pt said still soft- asked her to try this dose for a couple of days. 16. IV LUE- will remove- not using and been in 1 week.   LOS: 6 days A FACE TO FACE EVALUATION WAS PERFORMED  Lenville Hibberd 04/08/2021, 3:56 PM

## 2021-04-08 NOTE — Progress Notes (Signed)
Patient ID: Maria [name], female   DOB: 1923-12-13, 87 y.o.   MRN: [id number]  Met with pt to inform of team conference goals of supervision-min assist level and target discharge of 6/22. Pt is deciding if she plans to go the rehab wing of [facility]. Made aware would need booster if not will need to quarantine for 14 days. Have sent information to [facility]. Pt to discuss booster with daughter, but was against it before. Will have bed Monday or Tuesday of next week.

## 2021-04-08 NOTE — Progress Notes (Signed)
Physical Therapy Session Note  Patient Details  Name: Maria [name] MRN: [id number] Date of Birth: 1924/09/09  Today's Date: 04/08/2021 PT Individual Time: Session1: 0930-1000; Session2: 4270-6237 PT Individual Time Calculation (min): 30 min & 65 min  Short Term Goals: Week 1:  PT Short Term Goal 1 (Week 1): STG=LTG due to ELOS  Skilled Therapeutic Interventions/Progress Updates:  Session1:  Patient in supine and reports able to shower this morning.  Performed therex in supine as noted below.  Supine to sit with S and increased time with some pain in leg. Patient sit to stand with CGA and ambulated with S to CGA x 200' with cues for proximity to RW and tall posture as well as for straight path with walker as walker tending to veer L.  Patient requesting to stay in chair to mobilize in room in w/c to straighten up her room.  Placed seat belt alarm on and activated and pt left up in w/c and aware how to propel and call for assistance.  Housekeeper in the room as well.  [place]:  Patient in supine and reports now hearing she will have to get in quarantine 14 days when she gets there.  Trying to decide if she will go or stay here.  Reminded her our d/c date is just 2 days from when she thought she might go to skilled rehab.  She seems conflicted, but redirected to working on functional skills.  Supine to sit with S and ambulated to w/c with close S.  Seated in w/c assisted to ortho gym.  Discussed need to transfer to vehicle if plans to go to SNF in family vehicle.  Patient had daughter bring in her swivel seat to help with car transfers.  Performed car transfer to simulated minivan height (27") with swivel seat with min A and increased time.  Patient reports she does not feel seat is that high.  Patient standing at [facility] for dynamic balance activity with feet on level tile initially for tapping letters in sequence with CGA to S with 1 UE support.  Then standing on [employer] balance pad to tap numbers in  sequence 1-40 with single UE support and CGA.  Needed cues for letter sequence, but not for numbers.  Patient standing at counter for hip abduction bilateral, extension on L only, hamstring curls bilateral, heel raises bilateral and minisquats all x 10 with bilateral UE support and close S to CGA.  Patient questioning if pain in hip caused by activities okay.  Explained need for strengthening and ROM activities and expected inflammation and that ice usually helps calm inflammation.  Patient ambulated 81' with RW and CGA due to L knee buckling.  Assisted in w/c to room.  Stand step to bed with RW and S.  Sit to supine S and placed ice to L hip and left with call bell and needs in reach.   Therapy Documentation Precautions:  Precautions Precautions: Fall Restrictions Weight Bearing Restrictions: Yes LLE Weight Bearing: Weight bearing as tolerated  Pain: Pain Assessment Pain Scale: 0-10 Pain Score: 6  Pain Type: Acute pain Pain Location: Hip Pain Orientation: Left Pain Descriptors / Indicators: Aching Pain Frequency: Intermittent Pain Onset: On-going Patients Stated Pain Goal: 0 Pain Intervention(s): Medication (See eMAR)    Exercises: General Exercises - Lower Extremity Ankle Circles/Pumps: AROM;Both;10 reps;Supine Short Arc Quad: Strengthening;Both;10 reps;Supine (w/ 5 sec hold) Heel Slides: AROM;Both;10 reps;Supine Hip ABduction/ADduction: AROM;Both;10 reps;Supine Total Joint Exercises Towel Squeeze: Strengthening;Both;10 reps;Supine Bridges: Strengthening;Both;10 reps;Supine  Therapy/Group:  Individual Therapy  [name] Magda Kiel, PT 04/08/2021, 9:53 AM

## 2021-04-08 NOTE — Progress Notes (Signed)
Occupational Therapy Session Note  Patient Details  Name: Maria [name] MRN: [id number] Date of Birth: 1923-11-01  Today's Date: 04/08/2021 OT Individual Time: 4742-5956 and 1335-1435 OT Individual Time Calculation (min): 62 min and 60 min   Short Term Goals: Week 1:  OT Short Term Goal 1 (Week 1): Patient will complete LB bathing/dressing with close spvsn using AE PRN. OT Short Term Goal 2 (Week 1): Patient will complete 3/3 toileting tasks with close spvsn and LRAD. OT Short Term Goal 3 (Week 1): Patient will complete toilet transfer with close spvsn and LRAD. OT Short Term Goal 4 (Week 1): Patient will complete dynamic standing ADL using LRAD and no overt LOB reaching outside BOS. OT Short Term Goal 5 (Week 1): Patient will identify 3 fall prevention strategies to improve safety at home with no cueing.  Skilled Therapeutic Interventions/Progress Updates:    Session 1: Pt received supine in bed with MD and RN present discussing meds due to increased pain overnight and this AM. Pt took pain meds at start of session and had forgotten she had already received meds early this AM. Pt agreeable to shower for pain relief. Sup<>sit close spvsn with use of bed rails and extra time. Stand pivot transfer no AD bed<>w/c<>TTB min A-HHA and vc's for technique. UB dressing min A to fasten bra when donning; LB dressing close spvsn seated and in stance with RW. Donned/doffed nonskid socks close spvsn. Pt continent of bladder and completed toileting following ambulation with RW to standard height toilet with close spvsn-CGA. Vc's and reminders for keeping RW close during ambulation and appropriate hand placement during sit<>stands. Pt washed hands in standing with RW close spvsn and returned supine in bed, alarm set, all needs met, and call bell in reach.   Session 2: Pt received seated in w/c, agreeable to OT session, reporting improved pain in L hip. Staff present taking meal orders; pt slightly agitated  regarding "difficulties getting meals for my gluten free diet." Pt was slightly distressed due to impending decision regarding d/c whether to receive COVID booster to go to SNF, quarantine when she gets there, or hire someone to stay with her in [place] temporarily. Pt education and emotional support provided on current levels of assistance and recommendation of supervision for safe d/c to next location. Pt requested to go to gift shop. Sit<>stands close spvsn-CGA with RW. Pt engaged in functional ambulation with RW and dynamic standing balance tasks around entire giftshop for ~10-15 minutes with no seated rest break completed with close spvsn and occasional CGA when reaching outside BOS (high, low, and laterally). No LOB. Pt self-propelled w/c ~100' on carpet with occasional min A and vc's for increased elbow ext for stronger pushes. Pt completed toileting at standard height toilet with close spvsn at ambulatory level with RW. Hand hygiene completed in w/c at sink set up A. Squat pivot transfer w/c>bed close spvsn. Sit>sup close spvsn with use of bed rails. Pt remained supine in bed, all needs met, alarm set.  Therapy Documentation Precautions:  Precautions Precautions: Fall Restrictions Weight Bearing Restrictions: Yes LLE Weight Bearing: Weight bearing as tolerated  Pain: Pain Assessment Pain Scale: 0-10 Pain Score: 4  Pain Type: Acute pain Pain Location: Hip Pain Orientation: Left Pain Descriptors / Indicators: Aching;Sore Pain Frequency: Intermittent Pain Onset: On-going Patients Stated Pain Goal: 0 Pain Intervention(s): Repositioned;Rest;Shower;Distraction Multiple Pain Sites: No   Therapy/Group: Individual Therapy  ZYRAH WISWELL 04/08/2021, 12:25 PM

## 2021-04-09 DIAGNOSIS — K900 Celiac disease: Secondary | ICD-10-CM

## 2021-04-09 MED ORDER — ONDANSETRON HCL 4 MG PO TABS: 4.0000 mg | ORAL_TABLET | Freq: Four times a day (QID) | ORAL | Status: DC | PRN

## 2021-04-09 NOTE — Progress Notes (Signed)
PROGRESS NOTE   Subjective/Complaints:   Pt hasn't taken Oxy since ordered yesterday- only scheduled tramadol- says pain very slightly better, but still an issue.  Main pain this AM is L knee- which I explained makes sense.  Little nauseated this AM- thinks got non gluten free food yesterday-  Had cheerios/milk for breakfast, but nauseated low grade since yesterday around lunch.   ROS:   Pt denies SOB, abd pain, CP, N/V/C/D, and vision changes   Objective:   No results found. No results for input(s): WBC, HGB, HCT, PLT in the last 72 hours.  No results for input(s): NA, K, CL, CO2, GLUCOSE, BUN, CREATININE, CALCIUM in the last 72 hours.   Intake/Output Summary (Last 24 hours) at 04/09/2021 0844 Last data filed at 04/09/2021 0828 Gross per 24 hour  Intake 893 ml  Output --  Net 893 ml        Physical Exam: Vital Signs Blood pressure (!) 151/61, pulse 66, temperature 98.6 F (37 C), temperature source Oral, resp. rate 18, height 5' 1.5" (1.562 m), weight 44.9 kg, SpO2 92 %.      General: awake, alert, appropriate, sitting up in bed- nurse at bedside; NAD HENT: conjugate gaze; oropharynx moist CV: regular rate and rhythm today; no JVD Pulmonary: CTA B/L; no W/R/R- good air movement GI: soft, NT, ND, (+)BS; slightly hyperactive Psychiatric: joking; appropriate Neurological: alert Extremities: No clubbing, cyanosis, or edema. Pulses are 2+ Psych: Pt's affect is appropriate. Pt is cooperative Skin: Clean and intact without signs of breakdown- L Hip incision C/D/I- looks good.  Neuro: Pt is cognitively appropriate with normal insight, memory, and awareness. Cranial nerves 2-12 are intact. Decreased LT/PP from mid calves distally. Reflexes are 1+ in all 4's. Fine motor coordination is intact. No tremors. Motor function is grossly 5/5 UE, 4/5 RLE, and 2-4/5 LLE prox to distal d/t pain..  Musculoskeletal: left hip with  swelling. Tender along operative site and with PROM. Right SCM slightly tender with movement   Assessment/Plan: 1. Functional deficits which require 3+ hours per day of interdisciplinary therapy in a comprehensive inpatient rehab setting. Physiatrist is providing close team supervision and 24 hour management of active medical problems listed below. Physiatrist and rehab team continue to assess barriers to discharge/monitor patient progress toward functional and medical goals  Care Tool:  Bathing    Body parts bathed by patient: Right arm, Left arm, Chest, Abdomen, Front perineal area, Buttocks, Right upper leg, Left upper leg, Right lower leg, Left lower leg, Face   Body parts bathed by helper: Left lower leg     Bathing assist Assist Level: Contact Guard/Touching assist     Upper Body Dressing/Undressing Upper body dressing   What is the patient wearing?: Bra, Pull over shirt    Upper body assist Assist Level: Minimal Assistance - Patient > 75% Assistive Device Comment: min A to fasten bra  Lower Body Dressing/Undressing Lower body dressing      What is the patient wearing?: Underwear/pull up, Pants     Lower body assist Assist for lower body dressing: Supervision/Verbal cueing     Toileting Toileting    Toileting assist Assist for toileting: Supervision/Verbal cueing  Transfers Chair/bed transfer  Transfers assist     Chair/bed transfer assist level: Minimal Assistance - Patient > 75% (no AD)     Locomotion Ambulation   Ambulation assist      Assist level: Contact Guard/Touching assist Assistive device: Walker-rolling Max distance: 180'   Walk 10 feet activity   Assist     Assist level: Contact Guard/Touching assist Assistive device: Walker-rolling   Walk 50 feet activity   Assist    Assist level: Contact Guard/Touching assist Assistive device: Walker-rolling    Walk 150 feet activity   Assist Walk 150 feet activity did not  occur: Safety/medical concerns  Assist level: Contact Guard/Touching assist Assistive device: Walker-rolling    Walk 10 feet on uneven surface  activity   Assist     Assist level: Minimal Assistance - Patient > 75% Assistive device: Aeronautical engineer Will patient use wheelchair at discharge?: No Type of Wheelchair: Manual    Wheelchair assist level: Supervision/Verbal cueing Max wheelchair distance: 60'    Wheelchair 50 feet with 2 turns activity    Assist        Assist Level: Supervision/Verbal cueing   Wheelchair 150 feet activity     Assist  Wheelchair 150 feet activity did not occur: Safety/medical concerns       Blood pressure (!) 151/61, pulse 66, temperature 98.6 F (37 C), temperature source Oral, resp. rate 18, height 5' 1.5" (1.562 m), weight 44.9 kg, SpO2 92 %.  Medical Problem List and Plan: 1.  Impaired gait and function secondary L femur fx s/p percutaneous pinning- WBAT             -patient may  shower             -ELOS/Goals: 2-3 weeks- supervision to mod I  Con't CIR_ PT and OT- WBAT on LLE 2.  Antithrombotics: -DVT/anticoagulation:  Continue Eliquis  -dopplers negative             -antiplatelet therapy:  N/A 3. Pain Management: Continue hydrocodone prn severe pain             --primarily using tramadol   -tylenol prn as well  -ice prn  6/14- Pain controlled except spot on back of head- is sore- con't tramadol prn  6/15- pt rating pain 7-8/10- will schedule tramadol and add oxy prn 2.5 mg q6 hours prn- also d/c norco   6/16- hasn't taken pain /oxy meds- encouraged her to take- had her take this AM - d/w nursing- if gets nauseated, give nausea meds 4. Mood: team to provide ego support  -Depression: on zoloft. Mood up-beat  6/15- joking some- but can tell she's in pain- con't regimen             -antipsychotic agents: N/A 5. Neuropsych: This patient is capable of making decisions on hee own behalf. 6.  Skin/Wound Care: Routine pressure relief measures.             --Monitor incision for healing. 7. Fluids/Electrolytes/Nutrition: encourage PO -I personally reviewed all of the patient's labs today, and lab work is within normal limits except for sodium which is improved at 132 and low albumin             -protein supp for low albumin  -recheck labs Monday 8. PAF: Monitor HR TID--continue Pacerone and Eliquis. Regular rhythm today  66/16- in RRR- con't regimen  -observe for tolerance with therapies 9. CAD s/p CABG/chronic diastolic CHF:  Will check weights daily and monitor for signs of overload. Continue Lasix.             --heart healthy diet.     Filed Weights   04/05/21 0600 04/08/21 0500 04/09/21 0500  Weight: 46.3 kg 43 kg 44.9 kg    6/16- weight overall stable- con't regimen 10 HTN: SBP elevated and DBP soft: continue Lasix and Norvasc. 11. H/o COPD/DOE:  Monitor for symptoms with increase in activity. 12. RLS/Chronic insomnia: On melatonin--takes 5-10 mg prn at home.   13. Idiopathic peripheral polyneuropathy: Reports that legs are numb/does not have sensation to urinate but voiding without difficulty. --has seen Dr. [name] in the past.   14. Bones: begin calcium and vitamin D supplements today   6/11- pt insistent we need to speak with Cardiology before she will take [place]- was told she would get clots with Calcium?We can call Cards/check Monday?   6/14- will keep off Calcium- don't want to cause more problems with calcification of vessels?   15. Celiac disease  6/11- having 3 BM's per day- will decrease senokot to 1x/day.   6/12- pt said still soft- asked her to try this dose for a couple of days.  6/16- nausea today- asked nurse to have low threshold for using compazine, etc.  16. IV LUE- will remove- not using and been in 1 week.   LOS: 7 days A FACE TO FACE EVALUATION WAS PERFORMED  Ovidio Steele 04/09/2021, 8:44 AM

## 2021-04-09 NOTE — Progress Notes (Signed)
Patient ID: Maria [name], female   DOB: 04/26/1924, 87 y.o.   MRN: [id number]  Met with pt to see if had spoken with her daughter regarding her plans. She is not feeling well today and missed some therapies as a result. She has decided she does not want the booster shot so aware if goes to [place] will need to quarantine 14 days while there. She wants to wait until tomorrow or Monday to make this decision regarding discharge disposition.  

## 2021-04-09 NOTE — Progress Notes (Signed)
Occupational Therapy Session Note  Patient Details  Name: Maria [name] MRN: [id number] Date of Birth: 1923/11/12  Today's Date: 04/09/2021 OT Individual Time: 0990-6893 OT Individual Time Calculation (min): 9 min    Short Term Goals: Week 1:  OT Short Term Goal 1 (Week 1): Patient will complete LB bathing/dressing with close spvsn using AE PRN. OT Short Term Goal 2 (Week 1): Patient will complete 3/3 toileting tasks with close spvsn and LRAD. OT Short Term Goal 3 (Week 1): Patient will complete toilet transfer with close spvsn and LRAD. OT Short Term Goal 4 (Week 1): Patient will complete dynamic standing ADL using LRAD and no overt LOB reaching outside BOS. OT Short Term Goal 5 (Week 1): Patient will identify 3 fall prevention strategies to improve safety at home with no cueing.  Skilled Therapeutic Interventions/Progress Updates:    Pt received supine in bed reporting pain in L hip, fatigue, nausea, and "feeling flat and unwell". Pt offered comfort measures and accepted. Pt offered light bed level activities but declined. Pt provided with emotional support and therapeutic listening regarding feeling sick and facing d/c decisions. Pt remained in bed, alarm set, call bell in reach, and all needs met.   Therapy Documentation Precautions:  Precautions Precautions: Fall Restrictions Weight Bearing Restrictions: Yes LLE Weight Bearing: Weight bearing as tolerated General: General OT Amount of Missed Time: 51 Minutes  Pain: Pain Assessment Pain Scale: 0-10 Pain Score: 5  Pain Type: Acute pain Pain Location: Hip Pain Orientation: Left Pain Descriptors / Indicators: Aching Pain Onset: On-going Pain Intervention(s): Emotional support;Distraction;Rest Multiple Pain Sites: No   Therapy/Group: Individual Therapy  SHERRE WOOTON 04/09/2021, 12:20 PM

## 2021-04-09 NOTE — Progress Notes (Signed)
Occupational Therapy Session Note  Patient Details  Name: Maria [name] MRN: [id number] Date of Birth: [birth year]  Today's Date: 04/09/2021 OT Missed Time: 92 Minutes Missed Time Reason: Pain;Patient ill (comment) (Pt feeling sick and declining tx)  Pt politely declining tx but profusely apologizing for missing session. Pt feeling ill and not up to session. Will follow up per POC.  Therapy/Group: Group Therapy  Tonny Branch 04/09/2021, 6:55 AM

## 2021-04-09 NOTE — Progress Notes (Signed)
Nutrition Follow-up  DOCUMENTATION CODES:  Underweight, Non-severe (moderate) malnutrition in context of chronic illness  INTERVENTION:  Continue Gluten Free diet.  Continue Ensure Enlive BID.  Continue MVI with minerals daily.  NUTRITION DIAGNOSIS:  Moderate Malnutrition related to chronic illness (COPD, CKD) as evidenced by moderate fat depletion, severe muscle depletion. - ongoing  GOAL:  Patient will meet greater than or equal to 90% of their needs - mostly meeting  MONITOR:  PO intake, Supplement acceptance, Labs, Weight trends  REASON FOR ASSESSMENT:  Consult Assessment of nutrition requirement/status  ASSESSMENT:  87 year old female with PMH of CAD, CKD III, celiac disease, COPD, atonic bladder, PAF. Pt sustained a fall with nondisplaced L basicervical femur fracture. Pt underwent L hip pinning on 6/06. Admitted to CIR on 6/09.  Spoke with pt at bedside after PT. Pt very tired and not feeling well - reports feeling sick to her stomach and is concerned about cross-contamination in the kitchen or a possible switch for a gluten-containing meal item. She reports that she was not very hungry for dinner last night and for breakfast this morning. She reports that she is drinking the Ensure daily.  Spoke with patient manager, [place], about possible contamination. She reports that she personally checks each tray for lunch and breakfast, but dinner is not checked by her. She ensures they follow allergy protocol, but she will be checking with culinary to be sure there is no contamination.  Admit wt: 45.7 kg Current wt: 44.9 kg  Recommend continuing Ensure Enlive BID and gluten-free diet.  Medications: reviewed; Vitamin D3, Pepcid BID, EE BID, ferrous sulfate, Lasix, Synthroid, MVI with minerals, Senokot, oxycodone PRN (given once today)  Labs: reviewed  Diet Order:   Diet Order             Diet gluten free Room service appropriate? Yes with Assist; Fluid consistency: Thin   Diet effective now                  EDUCATION NEEDS:  Education needs have been addressed  Skin:  Skin Assessment: Skin Integrity Issues: Skin Integrity Issues:: Incisions Incisions: L hip  Last BM:  04/08/21 - Type 6, medium  Height:  Ht Readings from Last 1 Encounters:  04/02/21 5' 1.5" (1.562 m)   Weight:  Wt Readings from Last 1 Encounters:  04/09/21 44.9 kg   BMI:  Body mass index is 18.4 kg/m.  Estimated Nutritional Needs:  Kcal:  1450-1650 Protein:  70-85 grams Fluid:  1.4-1.6 L  Derrel Nip, RD, LDN Registered Dietitian I After-Hours/Weekend Pager # in [place]
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
## 2021-04-10 MED ORDER — TRAMADOL HCL 50 MG PO TABS
100.0000 mg | ORAL_TABLET | Freq: Three times a day (TID) | ORAL | Status: DC
  Administered 2021-04-10 – 2021-04-15 (×16): 100 mg via ORAL
  Filled 2021-04-10 (×17): qty 2

## 2021-04-10 NOTE — Progress Notes (Signed)
Physical Therapy Weekly Progress Note  Patient Details  Name: Maria [name] MRN: [id number] Date of Birth: [birth year]  Beginning of progress report period: April 03, 2021 End of progress report period: April 10, 2021  Today's Date: 04/10/2021 PT Individual Time: 1020-1110 PT Individual Time Calculation (min): 50 min   Patient has met 0 of 1 short term goals.  Patient progressing towards LTG's set at S overall, but still needing occasional CGA for balance with L knee buckling and needing assist for standing balance at times with functional activities.  She has been performing transfers and bed mobility with S.   Patient continues to demonstrate the following deficits muscle weakness and L hip pain and decreased standing balance and therefore will continue to benefit from skilled PT intervention to increase functional independence with mobility.  Patient progressing toward long term goals..  Continue plan of care.  PT Short Term Goals Week 1:  PT Short Term Goal 1 (Week 1): STG=LTG due to ELOS PT Short Term Goal 1 - Progress (Week 1): Progressing toward goal Week 2:  PT Short Term Goal 1 (Week 2): STG=LTG due to ELOS  Skilled Therapeutic Interventions/Progress Updates:  Patient seated in w/c in room and reports she is changing rooms per nursing.  She had packed up some items with OT and seems continuing to focus on need to move.  Sit to stand to RW S and ambulated to bed 12' and sat on EOB to organize and pack with assistance items from nightstand.  Able to lean down in sitting to retrieve items from bottom drawer with S.  Patient sit to stand with S and ambulated to closet with w/c following for safety and pt retrieved items from closet with close S intermittent UE support and in standing from lower drawer with CGA.  Ambulated to new room with RW and CGA x 150'.  Seated in w/c to place items in drawers and closet with assist.  Patient assisted to supine with call bell and needs in reach and  bed alarm set.   Ambulation/gait training;Discharge planning;DME/adaptive equipment instruction;Functional mobility training;Pain management;Splinting/orthotics;Therapeutic Activities;UE/LE Strength taining/ROM;Wheelchair propulsion/positioning;Therapeutic Exercise;Stair training;Patient/family education;Neuromuscular re-education;Disease management/prevention;Balance/vestibular training   Therapy Documentation Precautions:  Precautions Precautions: Fall Restrictions Weight Bearing Restrictions: Yes LLE Weight Bearing: Weight bearing as tolerated  Pain: Pain Assessment Pain Scale: 0-10 Pain Score: 0-No pain Faces Pain Scale: Hurts little more Pain Location: Hip Pain Orientation: Left Pain Descriptors / Indicators: Aching Pain Onset: On-going Pain Intervention(s): Repositioned    Therapy/Group: Individual Therapy  [name] Magda Kiel, PT 04/10/2021, 12:29 PM

## 2021-04-10 NOTE — Progress Notes (Signed)
Occupational Therapy Weekly Progress Note  Patient Details  Name: Maria [name] MRN: [id number] Date of Birth: 1924-07-16  Beginning of progress report period: April 03, 2021 End of progress report period: April 10, 2021  Today's Date: 04/10/2021 OT Individual Time: 7616-0737 and 1425-1537 OT Individual Time Calculation (min): 58 min and 72 min   Patient has met 5 of 5 short term goals. Pt is consistently completing UB/LB BADLs with close spvsn using LRAD and has improved endurance and activity tolerance when completing dynamic standing and functional ambulatory activities. Pt requiring CGA when completing functional ambulation due to slight unsteadiness on feet and requiring min vc's to maintain safety when using RW.   Patient continues to demonstrate the following deficits: muscle weakness and muscle joint tightness, decreased cardiorespiratoy endurance, and decreased standing balance and decreased balance strategies and therefore will continue to benefit from skilled OT intervention to enhance overall performance with BADL and iADL.  Patient progressing toward long term goals..  Continue plan of care.  OT Short Term Goals Week 1:  OT Short Term Goal 1 (Week 1): Patient will complete LB bathing/dressing with close spvsn using AE PRN. OT Short Term Goal 1 - Progress (Week 1): Met OT Short Term Goal 2 (Week 1): Patient will complete 3/3 toileting tasks with close spvsn and LRAD. OT Short Term Goal 2 - Progress (Week 1): Met OT Short Term Goal 3 (Week 1): Patient will complete toilet transfer with close spvsn and LRAD. OT Short Term Goal 3 - Progress (Week 1): Met OT Short Term Goal 4 (Week 1): Patient will complete dynamic standing ADL using LRAD and no overt LOB reaching outside BOS. OT Short Term Goal 4 - Progress (Week 1): Met OT Short Term Goal 5 (Week 1): Patient will identify 3 fall prevention strategies to improve safety at home with no cueing. OT Short Term Goal 5 - Progress  (Week 1): Met  Week 2:  OT Short Term Goal 1 (Week 2): STGs=LTGs due to d/c date  Skilled Therapeutic Interventions/Progress Updates:    Session 1: Pt received supine in bed reporting no pain and agreeable to OT session, feeling better this AM. RN provided meds during session and informed pt of changing rooms today. Pt completed all bed mobility and sit<>stands with close spvsn; vc's required for hand placement when using RW in sit<>stands with improved carryover in session. Pt engaged in functional ambulation activity to gather clothing around room at [place] due to unsteadiness on feet. Vc's for placing clothes over rail of RW instead of holding in hands and keeping RW safe when reaching outside BOS; no LOB noted. UB dressing seated EOB set up A; LB dressing close spvsn seated and in stance. Pt then engaged in functional ambulation and dynamic standing balance task to pack up clothing in bags and reach outside BOS in low drawers; required CGA when reaching low and vc's to have RW close by. Pt completed oral hygiene standing at sink with RW and close spvsn. Pt completed stand pivot transfer no AD bed>w/c with close spvsn. Pt remained seated in w/c at sink completing grooming tasks, alarm set, call bell in hand, and all needs met.  Session 2: Pt received supine in bed in new room, reporting nausea, but agreeable to OT. Pt reporting increased stress from moving rooms and ordering food according to dietary restrictions for the weekend. Pt encouraged to participate but inform OT if feeling worse/unable. Sup<>sit<>stand with close spvsn and no vc's; functional ambulation with RW completed  with CGA bed>bathroom>sink>w/c. Pt completed toileting at close spvsn level, washed hands at sink while standing, and sat in w/c with vc for safe hand placement. Pt taken to day room and completed BLE activities to improve mobility and strength: long sitting stretches with back supported, heel abduction slides 10x, and knee flx  slides in supine 10x bilaterally. Pt attempted to achieve prone position but unable to do so due to nausea. Pt engaged in dynamic standing tolerance/balance activity also targeting visual perception standing with RW. Pt required to reach high and low outside BOS with no LOB and occasional CGA, but overal close spvsn to reach items and complete foam peg board designs from model. Mod vc's intermittently to correct errors in copying model, but pt demoing improvement (potentially errors due to position of model's photograph - difficult to see certain areas). Pt maintained standing tolerance during activity ~15 minutes without rest break. Pt remained supine in bed, alarm set, all needs met, and call bell in reach.  Therapy Documentation Precautions:  Precautions Precautions: Fall Restrictions Weight Bearing Restrictions: Yes LLE Weight Bearing: Weight bearing as tolerated  Pain: Pain Assessment Pain Scale: 0-10 Pain Score: 0-No pain Faces Pain Scale: Hurts little more Pain Location: Hip Pain Orientation: Left Pain Descriptors / Indicators: Aching Pain Onset: On-going Pain Intervention(s): Repositioned    Therapy/Group: Individual Therapy  Maria [name] 04/10/2021, 12:39 PM

## 2021-04-11 NOTE — Progress Notes (Signed)
PROGRESS NOTE   Subjective/Complaints:  Patient without issues today.  Her hip pain is well controlled at rest only hurts with movement  ROS:   Pt denies SOB, abd pain, CP, N/V/C/D, and vision changes   Objective:   No results found. No results for input(s): WBC, HGB, HCT, PLT in the last 72 hours.  No results for input(s): NA, K, CL, CO2, GLUCOSE, BUN, CREATININE, CALCIUM in the last 72 hours.  No intake or output data in the 24 hours ending 04/11/21 1437       Physical Exam: Vital Signs Blood pressure (!) 128/54, pulse 60, temperature 99.2 F (37.3 C), temperature source Oral, resp. rate 17, height 5' 1.5" (1.562 m), weight 43.3 kg, SpO2 96 %.    General: No acute distress Mood and affect are appropriate Heart: Regular rate and rhythm no rubs murmurs or extra sounds Lungs: Clear to auscultation, breathing unlabored, no rales or wheezes Abdomen: Positive bowel sounds, soft nontender to palpation, nondistended Extremities: No clubbing, cyanosis, or edema Skin: No evidence of breakdown, no evidence of rash   Extremities: No clubbing, cyanosis, or edema. Pulses are 2+ Psych: Pt's affect is appropriate. Pt is cooperative Skin: Clean and intact without signs of breakdown- L Hip incision C/D/I- looks good.  Neuro: Pt is cognitively appropriate with normal insight, memory, and awareness. Cranial nerves 2-12 are intact. Decreased LT/PP from mid calves distally. Reflexes are 1+ in all 4's. Fine motor coordination is intact. No tremors. Motor function is grossly 5/5 UE, 4/5 RLE, and 2-4/5 LLE prox to distal d/t pain..  Musculoskeletal: left hip with swelling. Tender along operative site and with PROM. Right SCM slightly tender with movement   Assessment/Plan: 1. Functional deficits which require 3+ hours per day of interdisciplinary therapy in a comprehensive inpatient rehab setting. Physiatrist is providing close team  supervision and 24 hour management of active medical problems listed below. Physiatrist and rehab team continue to assess barriers to discharge/monitor patient progress toward functional and medical goals  Care Tool:  Bathing    Body parts bathed by patient: Right arm, Left arm, Chest, Abdomen, Front perineal area, Buttocks, Right upper leg, Left upper leg, Right lower leg, Left lower leg, Face   Body parts bathed by helper: Left lower leg     Bathing assist Assist Level: Minimal Assistance - Patient > 75%     Upper Body Dressing/Undressing Upper body dressing   What is the patient wearing?: Pull over shirt, Bra    Upper body assist Assist Level: Minimal Assistance - Patient > 75% Assistive Device Comment: min A to fasten bra  Lower Body Dressing/Undressing Lower body dressing      What is the patient wearing?: Underwear/pull up, Pants     Lower body assist Assist for lower body dressing: Contact Guard/Touching assist     Toileting Toileting    Toileting assist Assist for toileting: Supervision/Verbal cueing     Transfers Chair/bed transfer  Transfers assist     Chair/bed transfer assist level: Supervision/Verbal cueing     Locomotion Ambulation   Ambulation assist      Assist level: Contact Guard/Touching assist Assistive device: Walker-rolling Max distance: 180'  Walk 10 feet activity   Assist     Assist level: Supervision/Verbal cueing Assistive device: Walker-rolling   Walk 50 feet activity   Assist    Assist level: Contact Guard/Touching assist Assistive device: Walker-rolling    Walk 150 feet activity   Assist Walk 150 feet activity did not occur: Safety/medical concerns  Assist level: Contact Guard/Touching assist Assistive device: Walker-rolling    Walk 10 feet on uneven surface  activity   Assist     Assist level: Minimal Assistance - Patient > 75% Assistive device: Aeronautical engineer  Will patient use wheelchair at discharge?: No Type of Wheelchair: Manual    Wheelchair assist level: Supervision/Verbal cueing Max wheelchair distance: 69'    Wheelchair 50 feet with 2 turns activity    Assist        Assist Level: Supervision/Verbal cueing   Wheelchair 150 feet activity     Assist  Wheelchair 150 feet activity did not occur: Safety/medical concerns       Blood pressure (!) 128/54, pulse 60, temperature 99.2 F (37.3 C), temperature source Oral, resp. rate 17, height 5' 1.5" (1.562 m), weight 43.3 kg, SpO2 96 %.  Medical Problem List and Plan: 1.  Impaired gait and function secondary L femur fx s/p percutaneous pinning- WBAT             -patient may  shower             -ELOS/Goals: 2-3 weeks- supervision to mod I  Con't CIR_ PT and OT- WBAT on LLE 2.  Antithrombotics: -DVT/anticoagulation:  Continue Eliquis  -dopplers negative             -antiplatelet therapy:  N/A 3. Pain Management: Continue hydrocodone prn severe pain             --primarily using tramadol   -tylenol prn as well  -ice prn  6/14- Pain controlled except spot on back of head- is sore- con't tramadol prn  6/15- pt rating pain 7-8/10- will schedule tramadol and add oxy prn 2.5 mg q6 hours prn- also d/c norco   6/16- hasn't taken pain /oxy meds- encouraged her to take- had her take this AM - d/w nursing- if gets nauseated, give nausea meds 4. Mood: team to provide ego support  -Depression: on zoloft. Mood up-beat  6/15- joking some- but can tell she's in pain- con't regimen             -antipsychotic agents: N/A 5. Neuropsych: This patient is capable of making decisions on hee own behalf. 6. Skin/Wound Care: Routine pressure relief measures.             --Monitor incision for healing. 7. Fluids/Electrolytes/Nutrition: encourage PO -I personally reviewed all of the patient's labs today, and lab work is within normal limits except for sodium which is improved at 132 and low  albumin             -protein supp for low albumin  -recheck labs Monday 8. PAF: Monitor HR TID--continue Pacerone and Eliquis. Regular rhythm today  66/16- in RRR- con't regimen  -observe for tolerance with therapies 9. CAD s/p CABG/chronic diastolic CHF: Will check weights daily and monitor for signs of overload. Continue Lasix.             --heart healthy diet.     Filed Weights   04/09/21 0500 04/10/21 0455 04/11/21 0500  Weight: 44.9 kg 43.6 kg 43.3 kg  6/16- weight overall stable- con't regimen 10 HTN: SBP elevated and DBP soft: continue Lasix and Norvasc. 11. H/o COPD/DOE:  Monitor for symptoms with increase in activity. 12. RLS/Chronic insomnia: On melatonin--takes 5-10 mg prn at home.   13. Idiopathic peripheral polyneuropathy: Reports that legs are numb/does not have sensation to urinate but voiding without difficulty. --has seen Dr. [name] in the past.   14. Bones: begin calcium and vitamin D supplements today   6/11- pt insistent we need to speak with Cardiology before she will take [place]- was told she would get clots with Calcium?We can call Cards/check Monday?   6/14- will keep off Calcium- don't want to cause more problems with calcification of vessels?   15. Celiac disease  6/11- having 3 BM's per day- will decrease senokot to 1x/day.   6/12- pt said still soft- asked her to try this dose for a couple of days.  6/16- nausea today- asked nurse to have low threshold for using compazine, etc.  16. IV LUE- will remove- not using and been in 1 week.   LOS: 9 days A FACE TO FACE EVALUATION WAS PERFORMED  Charlett Blake 04/11/2021, 2:37 PM

## 2021-04-12 NOTE — Progress Notes (Signed)
Patient reports LBM 3 days ago, PRN miralax given at 2100. Requested 39m's of Melatonin. Slept good. No BM tonight. 2 small foam dressings to left hip, swelling noted. Bilateral heels elevated off bed with pillows.MPatrici RanksA

## 2021-04-12 NOTE — Progress Notes (Signed)
Physical Therapy Session Note  Patient Details  Name: Maria [name] MRN: [id number] Date of Birth: [birth year]  Today's Date: 04/12/2021 PT Individual Time: 1315-1414 PT Individual Time Calculation (min): 59 min   Short Term Goals: Week 2:  PT Short Term Goal 1 (Week 2): STG=LTG due to ELOS  Skilled Therapeutic Interventions/Progress Updates:     Pt received seated at EOB and agrees to therapy. Reports some pain in L hip, but only with movement. Number not provided. PT provides mobility, rest breaks, and repositioning to manage pain. Pt performs stand step transfer to [facility] with RW and CGA. WC transport to gym for time management. Pt ambulates 100', and 175' with RW and CGA. PT provides verbal and tactile cues for upright gaze to improve posture and balance, as well as maintaining stance for longer period of time on L for strengthening and improved gait pattern. Pt transitions to supine with supervision and cues on positioning and sequencing. Pt performs supine therex for strengthening of L lower extremity. Exercise include x15 quad sets, hip abduction, SAQs, heel slides, and SLRs (2x5). PT provides verbal and tactile cues for correct performance and for NM feedback. Pt verbalizes most pain with eccentric control of L hip extension. Pt performs supine to with with tactile cues on sequencing and with increased time. Pt ambulates x150' back to room with RW and CGA. Left seated in WC with alarm intact and all needs within reach.  Therapy Documentation Precautions:  Precautions Precautions: Fall Restrictions Weight Bearing Restrictions: No LLE Weight Bearing: Weight bearing as tolerated   Therapy/Group: Individual Therapy  Breck Coons, PT, DPT 04/12/2021, 3:33 PM

## 2021-04-13 DIAGNOSIS — S72002D Fracture of unspecified part of neck of left femur, subsequent encounter for closed fracture with routine healing: Secondary | ICD-10-CM

## 2021-04-13 LAB — BASIC METABOLIC PANEL
Anion gap: 13 (ref 5–15)
BUN: 11 mg/dL (ref 8–23)
CO2: 19 mmol/L — ABNORMAL LOW (ref 22–32)
Calcium: 9.1 mg/dL (ref 8.9–10.3)
Chloride: 96 mmol/L — ABNORMAL LOW (ref 98–111)
Creatinine, Ser: 0.67 mg/dL (ref 0.44–1.00)
GFR, Estimated: >60 mL/min (ref >60)
Glucose, Bld: 80 mg/dL (ref 70–99)
Potassium: 4.7 mmol/L (ref 3.5–5.1)
Sodium: 128 mmol/L — ABNORMAL LOW (ref 135–145)

## 2021-04-13 LAB — SARS CORONAVIRUS 2 (TAT 6-24 HRS): SARS Coronavirus 2: NEGATIVE

## 2021-04-13 NOTE — Progress Notes (Addendum)
Patient ID: Maria [name], female   DOB: [birth year], 87 y.o.   MRN: [id number] Discussed with pt her plan she has decided to go to [facility] at discharge from rehab for more therapies and then go back to her home. Will contact [facility] to let know will be ready Wed. She will not get a booster prior to discharge either and aware she will need to quarantine 14 days while there. Will need COVID test prior to admission there.

## 2021-04-13 NOTE — Progress Notes (Signed)
Occupational Therapy Session Note  Patient Details  Name: Maria [name] MRN: [id number] Date of Birth: 1924/04/05  Today's Date: 04/13/2021 OT Individual Time: 2229-7989 and 1422-1535 OT Individual Time Calculation (min): 60 min and 73 min   Short Term Goals: Week 2:  OT Short Term Goal 1 (Week 2): STGs=LTGs due to d/c date  Skilled Therapeutic Interventions/Progress Updates:    Session 1: Pt received supine in bed, agreeable to OT, reporting fatigue from earlier PT session. Pt expressing confusion with d/c date, not sure which date was accurate; encouraged that SW would discuss with pt. Session focused on BADL routine. Sup>sit close spvsn with HOB raised, sit<>stand close spvsn with RW. Pt ambulated with RW to retrieve clothing items with close spvsn reaching high and low outside BOS with no overt LOB; vc's for RW safety when reaching and compensatory strategies of carrying items with RW. Pt ambulated to bathroom; completed toileting with close spvsn on regular height toilet. RN provided pain medication. Pt engaged in UB dressing with close spvsn doffing, min A donning for fastening bra; LB dressing with close spvsn doffing and vc's to stay seated when unthreading legs and removing socks for safety due to balance deficits. Pt showered using [place] and grab bars with close spvsn (CGA in stance). Pt demo'd frequent LOB when showering and donning clothes in stance with RW requiring min A for recovery; occasionally could self correct. Pt education on importance of doing tasks from seated position to maintain balance and safety at home. Pt demonstrated SOB/slight wheezing following shower but recovering quickly once out of bathroom and seated in w/c; vitals taken and were normal (see below). Pt completed grooming tasks seated in w/c at sink. Remained seated in w/c, alarm set, call bell in reach, and all needs met.  Session 2: Pt received seated in w/c, agreeable to OT, reporting no pain at start of  session. Session focus on improving dynamic standing balance, activity tolerance in standing, and visual perceptual tasks. Pt expressing less confusion regarding d/c date following discussion with SW. Pt taken to day room and engaged in dynamic standing balance/tolerance tasks using Wii Fit program. Pt required to step onto board and perform weight shifts in all directions to participate in variety of activities. Pt demonstrated difficulties at first with L weight shift, but improved upon practice and with CGA - intermittent min A to facilitate weight shift in multiple directions. Pt reporting increased pain and dizziness following weight shift to L and required 2 rest breaks between tasks; overall pt engaged in task for ~15 minutes before terminating due to pain. See vitals below (improved with seated rest break). Pt engaged in dynamic standing balance/tolerance task at table-top with BUE supported intermittently to complete 24-piece large puzzle. Pt demonstrated visual perceptual deficits requiring mod multimodal cues for successful completion of puzzle (pt often demoing difficulties with orienting pieces such as not lining up edges to connect or attempting to place two pieces with adjacent holes together). Pt engaged in task for ~20 minutes with no rest breaks. Pt returned to room, stand pivot transfer no AD w/c to bed using bed rails with CGA. Sit>sup close spvsn. Pt remained supine, bed alarm active, call bell in reach, all needs met. Reminded to call for assistance if getting out of bed, as pt first asked to have RW by bed so she could get up later to use bathroom.  Therapy Documentation Precautions:  Precautions Precautions: Fall Restrictions Weight Bearing Restrictions: No LLE Weight Bearing: Weight bearing as  tolerated  Vital Signs: Session 1: O2 sat 99%, 67 HR Session 2: OS sat 97%, 60 HR, BP 137/55  Pain: Pain Assessment Pain Scale: 0-10 Pain Score: 6  Pain Type: Acute pain Pain  Location: Hip Pain Orientation: Left Pain Descriptors / Indicators: Aching Pain Onset: On-going Pain Intervention(s): Repositioned;Shower;Rest;Emotional support Multiple Pain Sites: No   Therapy/Group: Individual Therapy  KYNSLEI ART 04/13/2021, 5:14 PM

## 2021-04-13 NOTE — Progress Notes (Signed)
PROGRESS NOTE   Subjective/Complaints: No complaints today  ROS:   Pt denies SOB, abd pain, CP, N/V/C/D, and vision changes   Objective:   No results found. No results for input(s): WBC, HGB, HCT, PLT in the last 72 hours.  Recent Labs    04/13/21 0700  NA 128*  K 4.7  CL 96*  CO2 19*  GLUCOSE 80  BUN 11  CREATININE 0.67  CALCIUM 9.1     Intake/Output Summary (Last 24 hours) at 04/13/2021 1522 Last data filed at 04/13/2021 1300 Gross per 24 hour  Intake 380 ml  Output --  Net 380 ml        Physical Exam: Vital Signs Blood pressure (!) 121/57, pulse 63, temperature 97.9 F (36.6 C), temperature source Oral, resp. rate 16, height 5' 1.5" (1.562 m), weight 44.5 kg, SpO2 98 %. Gen: no distress, normal appearing HEENT: oral mucosa pink and moist, NCAT Cardio: Reg rate Chest: normal effort, normal rate of breathing Abd: soft, non-distended    Extremities: No clubbing, cyanosis, or edema. Pulses are 2+ Psych: Pt's affect is appropriate. Pt is cooperative Skin: Clean and intact without signs of breakdown- L Hip incision C/D/I- looks good.  Neuro: Pt is cognitively appropriate with normal insight, memory, and awareness. Cranial nerves 2-12 are intact. Decreased LT/PP from mid calves distally. Reflexes are 1+ in all 4's. Fine motor coordination is intact. No tremors. Motor function is grossly 5/5 UE, 4/5 RLE, and 2-4/5 LLE prox to distal d/t pain..  Musculoskeletal: left hip with swelling. Tender along operative site and with PROM. Right SCM slightly tender with movement   Assessment/Plan: 1. Functional deficits which require 3+ hours per day of interdisciplinary therapy in a comprehensive inpatient rehab setting. Physiatrist is providing close team supervision and 24 hour management of active medical problems listed below. Physiatrist and rehab team continue to assess barriers to discharge/monitor patient  progress toward functional and medical goals  Care Tool:  Bathing    Body parts bathed by patient: Right arm, Left arm, Chest, Abdomen, Front perineal area, Buttocks, Right upper leg, Left upper leg, Right lower leg, Left lower leg, Face   Body parts bathed by helper: Left lower leg     Bathing assist Assist Level: Minimal Assistance - Patient > 75%     Upper Body Dressing/Undressing Upper body dressing   What is the patient wearing?: Pull over shirt, Bra    Upper body assist Assist Level: Minimal Assistance - Patient > 75% Assistive Device Comment: min A to fasten bra  Lower Body Dressing/Undressing Lower body dressing      What is the patient wearing?: Underwear/pull up, Pants     Lower body assist Assist for lower body dressing: Contact Guard/Touching assist     Toileting Toileting    Toileting assist Assist for toileting: Supervision/Verbal cueing     Transfers Chair/bed transfer  Transfers assist     Chair/bed transfer assist level: Supervision/Verbal cueing     Locomotion Ambulation   Ambulation assist      Assist level: Supervision/Verbal cueing Assistive device: Walker-rolling Max distance: 180'   Walk 10 feet activity   Assist     Assist  level: Supervision/Verbal cueing Assistive device: Walker-rolling   Walk 50 feet activity   Assist    Assist level: Supervision/Verbal cueing Assistive device: Walker-rolling    Walk 150 feet activity   Assist Walk 150 feet activity did not occur: Safety/medical concerns  Assist level: Supervision/Verbal cueing Assistive device: Walker-rolling    Walk 10 feet on uneven surface  activity   Assist     Assist level: Contact Guard/Touching assist Assistive device: Walker-rolling   Wheelchair     Assist Will patient use wheelchair at discharge?: No Type of Wheelchair: Manual    Wheelchair assist level: Supervision/Verbal cueing Max wheelchair distance: 96'    Wheelchair 50  feet with 2 turns activity    Assist        Assist Level: Supervision/Verbal cueing   Wheelchair 150 feet activity     Assist  Wheelchair 150 feet activity did not occur: Safety/medical concerns       Blood pressure (!) 121/57, pulse 63, temperature 97.9 F (36.6 C), temperature source Oral, resp. rate 16, height 5' 1.5" (1.562 m), weight 44.5 kg, SpO2 98 %.  Medical Problem List and Plan: 1.  Impaired gait and function secondary L femur fx s/p percutaneous pinning- WBAT             -patient may  shower             -ELOS/Goals: 2-3 weeks- supervision to mod I  Continue PT and OT- WBAT on LLE 2.  Antithrombotics: -DVT/anticoagulation:  Continue Eliquis  -dopplers negative             -antiplatelet therapy:  N/A 3. Pain Management: Continue hydrocodone prn severe pain             --primarily using tramadol   -tylenol prn as well  -ice prn  6/14- Pain controlled except spot on back of head- is sore- con't tramadol prn  6/15- pt rating pain 7-8/10- will schedule tramadol and add oxy prn 2.5 mg q6 hours prn- also d/c norco   6/16- hasn't taken pain /oxy meds- encouraged her to take- had her take this AM - d/w nursing- if gets nauseated, give nausea meds 4. Mood: team to provide ego support  -Depression: on zoloft. Mood up-beat  6/15- joking some- but can tell she's in pain- con't regimen             -antipsychotic agents: N/A 5. Neuropsych: This patient is capable of making decisions on hee own behalf. 6. Skin/Wound Care: Routine pressure relief measures.             --Monitor incision for healing. 7. Fluids/Electrolytes/Nutrition: encourage PO -I personally reviewed all of the patient's labs today, and lab work is within normal limits except for sodium which is improved at 132 and low albumin             -protein supp for low albumin  -recheck labs Monday 8. PAF: Monitor HR TID--continue Pacerone and Eliquis. Regular rhythm today  66/16- in RRR- con't  regimen  -observe for tolerance with therapies 9. CAD s/p CABG/chronic diastolic CHF: Will check weights daily and monitor for signs of overload. Continue Lasix.             --heart healthy diet.     Filed Weights   04/10/21 0455 04/11/21 0500 04/13/21 0455  Weight: 43.6 kg 43.3 kg 44.5 kg    6/16- weight overall stable- con't regimen 10 HTN: SBP elevated and DBP soft: continue Lasix and Norvasc.  11. H/o COPD/DOE:  Monitor for symptoms with increase in activity. 12. RLS/Chronic insomnia: Continue melatonin--takes 5-10 mg prn at home.   13. Idiopathic peripheral polyneuropathy: Reports that legs are numb/does not have sensation to urinate but voiding without difficulty. --has seen Dr. [name] in the past.   14. Celiac disease  Continue senna-docusate daily, miralax PRN.  15. Hyponatremia: Na is 128 on 6/20, asymptomatic, repeat tomorrow to trend.   LOS: 11 days A FACE TO FACE EVALUATION WAS PERFORMED  Clide Deutscher Donabelle Molden 04/13/2021, 3:22 PM

## 2021-04-13 NOTE — Discharge Summary (Addendum)
Physician Discharge Summary  Patient ID: Maria [name] MRN: [id number] DOB/AGE: 1924/09/29 87 y.o.  Admit date: 04/02/2021 Discharge date: 04/15/2021  Discharge Diagnoses:  Principal Problem:   Femur fracture, left ([place]) Active Problems:   Hyponatremia   CKD (chronic kidney disease), stage III (HCC)   Celiac disease   Malnutrition of moderate degree   Discharged Condition: stable  Significant Diagnostic Studies: VAS [place] LOWER EXTREMITY VENOUS (DVT)  Result Date: 04/02/2021  Lower Venous DVT Study Patient Name:  Maria [name]  Date of Exam:   04/02/2021 Medical Rec #: [id number]          Accession #:    [id number] Date of Birth: 1924/05/08          Patient Gender: F Patient Age:   83Y Exam Location:  [facility] Procedure:      VAS [place] LOWER EXTREMITY VENOUS (DVT) Referring Phys: [id number] RALPH A NETTEY --------------------------------------------------------------------------------  Indications: Edema.  Comparison Study: no prior Performing Technologist: Archie Patten RVS  Examination Guidelines: A complete evaluation includes B-mode imaging, spectral Doppler, color Doppler, and power Doppler as needed of all accessible portions of each vessel. Bilateral testing is considered an integral part of a complete examination. Limited examinations for reoccurring indications may be performed as noted. The reflux portion of the exam is performed with the patient in reverse Trendelenburg.  +---------+---------------+---------+-----------+----------+-------------------+ RIGHT    CompressibilityPhasicitySpontaneityPropertiesThrombus Aging      +---------+---------------+---------+-----------+----------+-------------------+ CFV      Full           Yes                                               +---------+---------------+---------+-----------+----------+-------------------+ SFJ      Full                                                              +---------+---------------+---------+-----------+----------+-------------------+ FV Prox  Full                                                             +---------+---------------+---------+-----------+----------+-------------------+ FV Mid   Full                                                             +---------+---------------+---------+-----------+----------+-------------------+ FV Distal               Yes      Yes                                      +---------+---------------+---------+-----------+----------+-------------------+ PFV      Full                                                             +---------+---------------+---------+-----------+----------+-------------------+  POP      Full           Yes      Yes                                      +---------+---------------+---------+-----------+----------+-------------------+ PTV      Full                                                             +---------+---------------+---------+-----------+----------+-------------------+ PERO                                                  Not well visualized +---------+---------------+---------+-----------+----------+-------------------+   +---------+---------------+---------+-----------+----------+-------------------+ LEFT     CompressibilityPhasicitySpontaneityPropertiesThrombus Aging      +---------+---------------+---------+-----------+----------+-------------------+ CFV      Full           Yes      Yes                                      +---------+---------------+---------+-----------+----------+-------------------+ SFJ      Full                                                             +---------+---------------+---------+-----------+----------+-------------------+ FV Prox  Full                                                             +---------+---------------+---------+-----------+----------+-------------------+ FV  Mid   Full                                                             +---------+---------------+---------+-----------+----------+-------------------+ FV DistalFull                                                             +---------+---------------+---------+-----------+----------+-------------------+ PFV      Full                                                             +---------+---------------+---------+-----------+----------+-------------------+ POP      Full  Yes      Yes                                      +---------+---------------+---------+-----------+----------+-------------------+ PTV      Full                                                             +---------+---------------+---------+-----------+----------+-------------------+ PERO                                                  Not well visualized +---------+---------------+---------+-----------+----------+-------------------+     Summary: BILATERAL: - No evidence of deep vein thrombosis seen in the lower extremities, bilaterally. -No evidence of popliteal cyst, bilaterally.   *See table(s) above for measurements and observations. Electronically signed by Monica Martinez MD on 04/02/2021 at 7:48:01 PM.    Final     Labs:  Basic Metabolic Panel: BMP Latest Ref Rng & Units 04/14/2021 04/13/2021 04/06/2021  Glucose 70 - 99 mg/dL 85 80 96  BUN 8 - 23 mg/dL 13 11 10   Creatinine 0.44 - 1.00 mg/dL 0.70 0.67 0.58  BUN/Creat Ratio 12 - 28 - - -  Sodium 135 - 145 mmol/L 132(L) 128(L) 131(L)  Potassium 3.5 - 5.1 mmol/L 4.2 4.7 4.2  Chloride 98 - 111 mmol/L 95(L) 96(L) 98  CO2 22 - 32 mmol/L 24 19(L) 24  Calcium 8.9 - 10.3 mg/dL 9.2 9.1 8.8(L)     CBC: CBC Latest Ref Rng & Units 04/03/2021 04/01/2021 03/31/2021  WBC 4.0 - 10.5 K/uL 6.4 7.1 5.1  Hemoglobin 12.0 - 15.0 g/dL 11.4(L) 10.7(L) 11.0(L)  Hematocrit 36.0 - 46.0 % 33.3(L) 31.8(L) 33.3(L)  Platelets 150 - 400 K/uL 301 250 241      CBG: No results for input(s): GLUCAP in the last 168 hours.  Brief HPI:   Maria [name] is a 87 y.o. female with history of CAD, CKD 3, celiac disease, COPD, PAF who sustained a fall with subsequent nondisplaced left basicervical femur fracture while at [facility].  She was transferred to [facility] on 03/29/2019 for management, transition to IV heparin and underwent left hip pinning by Dr. Mardelle Matte on 06/06.  Postop WBAT and Eliquis resumed the next day.  Hospital course has been significant for ABLA as well as ongoing issues with pain and weakness.  Follow-up labs shows hyponatremia to be stable.  Therapy has been ongoing and patient with limitations in mobility and ADLs.  CIR was recommended due to functional decline   Hospital Course: Maria [name] was admitted to rehab 04/02/2021 for inpatient therapies to consist of PT, ST and OT at least three hours five days a week. Past admission physiatrist, therapy team and rehab RN have worked together to provide customized collaborative inpatient rehab.Her blood pressures were monitored on TID basis and have been relatively stable.  No signs of fluid overload needed.  She has had issues with loose stools as well as variable intake in part due to celiac diseas and dietary restrictions. She was advised to have family bring supplements and food from home as able.  She also reported intermittent issues with nausea felt to be due to narcotics and was advised to take medications with food.  Tramadol was scheduled  qid with low-dose oxycodone prn to be used for pain control and to help with therapy tolerance.Serial check of labs shows chronic hyponatremia continues to fluctuate but is at baseline.  Her Renal status is stable.   Follow-up CBC shows H&H to be improving.  Her blood pressures were monitored on TID basis and have been relatively stable.  No signs of fluid overload needed.  She has had issues with loose stools in part due to her diet and she was advised  to have family bring supplements from home as able.  She also reported intermittent issues with nausea felt to be due to narcotics and was advised to take medications with food.  Tramadol was scheduled TID and was titrated upwards for pain control and to help with activity/therapy tolerance.  Her hip incision is clean dry and intact and is healing well.  Edema her hip incision is clean, dry and intact and is healing well with decreasing edema. She has been making steady gains but requires CGA to supervision. She was discharged to skilled nursing department at [facility] for follow up therapy.    Rehab course: During patient's stay in rehab weekly team conferences were held to monitor patient's progress, set goals and discuss barriers to discharge. At admission, patient required min assist with mobility and min to max assist with basic ADL tasks. She  has had improvement in activity tolerance, balance, postural control as well as ability to compensate for deficits.  She requires supervision to [facility] for ADL tasks. She requires supervision for transfers and is able to ambulate 100 to 120 feet with contact-guard assist.   Disposition: Home  Diet: Gluten-free diet  Special Instructions: Keep incision clean and dry.  Allergies as of 04/15/2021       Reactions   Fentanyl Nausea And Vomiting   Severe n/v   Amoxicillin Nausea Only, Other (See Comments)   dizziness   Atorvastatin Other (See Comments)   Unknown reaction - reported by Sadie Haber   Bupropion Other (See Comments)   nightmares   Codeine Nausea And Vomiting   Fosamax [alendronate Sodium] Other (See Comments)   Jaw pain   Gabapentin Other (See Comments)   dizziness   Gluten Meal Other (See Comments)   Celiac Disease   Hydrochlorothiazide Other (See Comments)   Hyponatremia, GI upset   Tetanus Toxoid, Adsorbed Swelling   Severe swelling at site of injection (pt tolerates 1/2 injection then 1/2 week later)        Medication List      STOP taking these medications    HYDROcodone-acetaminophen 5-325 MG tablet Commonly known as: NORCO/VICODIN       TAKE these medications    acetaminophen 500 MG tablet Commonly known as: TYLENOL Take 1,000 mg by mouth every 6 (six) hours as needed (pain).   amiodarone 200 MG tablet Commonly known as: PACERONE Take 0.5 tablets (100 mg total) by mouth daily. What changed: when to take this   amLODipine 2.5 MG tablet Commonly known as: NORVASC Take 1 tablet (2.5 mg total) by mouth daily for 30 days.   apixaban 2.5 MG Tabs tablet Commonly known as: ELIQUIS Take 1 tablet (2.5 mg total) by mouth 2 (two) times daily for 30 days.   cholecalciferol 10 MCG (400 UNIT) Tabs tablet Commonly known as: VITAMIN D3 Take 1 tablet (400 Units total)  by mouth daily. Start taking on: April 16, 2021   famotidine 10 MG tablet Commonly known as: PEPCID Take 1 tablet (10 mg total) by mouth 2 (two) times daily.   feeding supplement Liqd Take 237 mLs by mouth 2 (two) times daily between meals.   ferrous sulfate 325 (65 FE) MG tablet Take 1 tablet (325 mg total) by mouth daily with breakfast.   furosemide 20 MG tablet Commonly known as: LASIX Take 1 tablet (20 mg total) by mouth every morning.   ICY HOT EX Apply 1 application topically daily as needed (pain).   levothyroxine 50 MCG tablet Commonly known as: SYNTHROID Take 0.5 tablets (25 mcg total) by mouth daily before breakfast. What changed: medication strength   magnesium hydroxide 400 MG/5ML suspension Commonly known as: MILK OF MAGNESIA Take 15 mLs by mouth at bedtime as needed for mild constipation.   melatonin 5 MG Tabs Take 5 mg by mouth at bedtime. What changed: Another medication with the same name was added. Make sure you understand how and when to take each.   melatonin 5 MG Tabs Take 1-2 tablets (5-10 mg total) by mouth at bedtime. What changed: You were already taking a medication with the same name, and this  prescription was added. Make sure you understand how and when to take each.   multivitamin with minerals Tabs tablet Take 1 tablet by mouth daily.   polyethylene glycol 17 g packet Commonly known as: MIRALAX / GLYCOLAX Take 17 g by mouth daily as needed for moderate constipation or mild constipation.   senna-docusate 8.6-50 MG tablet Commonly known as: Senokot-S Take 1 tablet by mouth daily. Start taking on: April 16, 2021   sertraline 50 MG tablet Commonly known as: ZOLOFT Take 50 mg by mouth every morning.   Systane 0.4-0.3 % Soln Generic drug: Polyethyl Glycol-Propyl Glycol Place 1 drop into both eyes at bedtime.   traMADol 50 MG tablet--Rx# 20 pills Commonly known as: ULTRAM Take 2 tablets (100 mg total) by mouth 3 (three) times daily.        Contact information for follow-up providers     Raulkar, Clide Deutscher, MD Follow up.   Specialty: Physical Medicine and Rehabilitation Why: 06/25/21 please arrive at 12:40pm for 1:00pm appointment Contact information: [address] [place] [address] [phone]              Contact information for after-discharge care     Destination     [facility] .   Service: Skilled Nursing Contact information: [place] [phone]                     Signed: Bary Leriche 04/15/2021, 11:25 AM

## 2021-04-13 NOTE — Progress Notes (Signed)
Physical Therapy Session Note  Patient Details  Name: NAJA APPERSON MRN: [id number] Date of Birth: [birth year]  Today's Date: 04/13/2021 PT Individual Time: 0830-0930 PT Individual Time Calculation (min): 60 min   Short Term Goals: Week 2:  PT Short Term Goal 1 (Week 2): STG=LTG due to ELOS  Skilled Therapeutic Interventions/Progress Updates:    Patient in supine and reports still not sure when she is leaving.  She reports she likely needs rehab, but still seems hesitant.  Session focus mainly on gait, balance, safety education and functional mobility.  Supine to sit with S and seated to don her jacket with min A.  Patient sit to stand throughout session with S and occasional cues for hand placement for safety.  She ambulated with RW with S to therapy gym x 160'.  Negotiated curb step with RW and CGA and cues for sequence, still stepping with L first to ascend on last trial, but reports she realized it was wrong once she did it.  She needed cues each time for correct sequence.  Patient performed car transfer with min A for legs into van height on disc she brought from home.  Patient performed sitting and standing dynamic balance task reaching for and tossing bean bags with S sitting and S with 1UE support in standing.  Patient negotiated ramp with RW and CGA. In household environment ambulated with RW and S with increased time for transfers from furniture, but still S with RW.  Able to retrieve cup from second cabinet shelf and problem solved ways to get items to table from counter safely while using the walker.  Patient asking about driving and educated would be MD decision, but still would need to drive in safe are with family in the car when cleared to drive.  Patient assisted back to room in w/c. Sit to stand and stand pivot to bed with RW with S.  Left in supine with call bell and needs in reach and bed alarm active.   Therapy Documentation Precautions:  Precautions Precautions:  Fall Restrictions Weight Bearing Restrictions: No LLE Weight Bearing: Weight bearing as tolerated  Pain: Pain Assessment Pain Scale: 0-10 Pain Score: 7  Pain Type: Acute pain Pain Location: Hip Pain Orientation: Left Pain Descriptors / Indicators: Aching Pain Frequency: Intermittent Pain Onset: On-going Patients Stated Pain Goal: 0 Pain Intervention(s): Medication (See eMAR)    Therapy/Group: Individual Therapy  [name] Magda Kiel, PT 04/13/2021, 12:36 PM

## 2021-04-14 LAB — BASIC METABOLIC PANEL
Anion gap: 13 (ref 5–15)
BUN: 13 mg/dL (ref 8–23)
CO2: 24 mmol/L (ref 22–32)
Calcium: 9.2 mg/dL (ref 8.9–10.3)
Chloride: 95 mmol/L — ABNORMAL LOW (ref 98–111)
Creatinine, Ser: 0.7 mg/dL (ref 0.44–1.00)
GFR, Estimated: >60 mL/min (ref >60)
Glucose, Bld: 85 mg/dL (ref 70–99)
Potassium: 4.2 mmol/L (ref 3.5–5.1)
Sodium: 132 mmol/L — ABNORMAL LOW (ref 135–145)

## 2021-04-14 NOTE — Progress Notes (Signed)
Patient ID: Maria [name], female   DOB: 1924/07/25, 87 y.o.   MRN: [id number] Follow up with the patient regarding pending discharge and readiness for discharge. Patient noted she is ready but not ready for discharge tomorrow. Noted she really did not get to practice real life as she was not allowed to get up to the toilet solo and just recently was allowed to sit up in the wheelchair without an alarm. Noted the bed is armed and she has very little freedom. On the other hand, feels like she does need a little more therapy before going solo. Aware of going to the SNF portion of her home facility for more therapy before returning home. Family brought in flowers so the patient will have some home like activities to work on but feels like she is ready for the next step. Marland Kitchenme

## 2021-04-14 NOTE — Patient Care Conference (Signed)
Inpatient RehabilitationTeam Conference and Plan of Care Update Date: 04/14/2021   Time: 12:10 PM    Patient Name: Maria [name]      Medical Record Number: [id number]  Date of Birth: 13-Jan-1924 Sex: Female         Room/Bed: 4W09C/4W09C-01 Payor Info: Payor: MEDICARE / Plan: MEDICARE PART A AND B / Product Type: *No Product type* /    Admit Date/Time:  04/02/2021  2:34 PM  Primary Diagnosis:  Femur fracture, left [facility])  Hospital Problems: Principal Problem:   Femur fracture, left ([place]) Active Problems:   Hyponatremia   CKD (chronic kidney disease), stage III ([place])   Celiac disease   Malnutrition of moderate degree    Expected Discharge Date: Expected Discharge Date: 04/15/21  Team Members Present: Physician leading conference: Dr. Leeroy Cha Care Coodinator Present: Dorien Chihuahua, RN, BSN, CRRN;Becky Dupree, LCSW Nurse Present: Dorien Chihuahua, RN PT Present: Magda Kiel, PT OT Present: Other (comment) Hulda Marin, OT) PPS Coordinator present : Gunnar Fusi, SLP     Current Status/Progress Goal Weekly Team Focus  Bowel/Bladder             Swallow/Nutrition/ Hydration             ADL's   close spvsn for bed mobility, transfers, and functional ambulation; UB adls set up-min A; LB adls spvsn-CGA. Limited by decreased balance, endurance, pain, and safety.  spvsn-mod I  safety, adls/transfers, balance, general conditioning, endurance   Mobility   S for bed mobility, transfers and ambulation with RW, CGA for 1 step & ramp and min A for car transfer  S overall  safety, balance, gait, L LE strength   Communication             Safety/Cognition/ Behavioral Observations            Pain             Skin               Discharge Planning:      Team Discussion: Patient concerned about weight loss/nutritional needs and ability to obtain adequate nutrition for energy needs. Agreeable to SNF for short period then back to her independent cottage. Plan ambulance  transport at discharge  Patient on target to meet rehab goals: yes, currently CGA and able to manage walker and 1 step with CGA. Distance limited by left hip pain. Requires close supervision for ADLs with LOB during shower yesterday and shortness of breathe. Energy conservation strategies reviewed with safety.  *See Care Plan and progress notes for long and short-term goals.   Revisions to Treatment Plan:  Practice with steps/community access using a walker   Teaching Needs: Transfers, medications, skin care, etc.  Current Barriers to Discharge: Home enviroment access/layout and Lack of/limited family support  Possible Resolutions to Barriers: SNF short term recommended     Medical Summary Current Status: incision to left hip, postoperative pain, low BMI, insomnia, HTN  Barriers to Discharge: Medical stability;Wound care  Barriers to Discharge Comments: incision to left hip, postoperative pain, low BMI, insomnia, HTN Possible Resolutions to [employer]: continue daily wound care, continue tramadol PRN for pain, continue to encourage po intake, continue melatonin, continue amlodipine   Continued Need for Acute Rehabilitation Level of Care: The patient requires daily medical management by a physician with specialized training in physical medicine and rehabilitation for the following reasons: Direction of a multidisciplinary physical rehabilitation program to maximize functional independence : Yes Medical management of patient  stability for increased activity during participation in an intensive rehabilitation regime.: Yes Analysis of laboratory values and/or radiology reports with any subsequent need for medication adjustment and/or medical intervention. : Yes   I attest that I was present, lead the team conference, and concur with the assessment and plan of the team.   Dorien Chihuahua B 04/14/2021, 4:22 PM

## 2021-04-14 NOTE — Progress Notes (Signed)
Inpatient Rehabilitation Care Coordinator Discharge Note  The overall goal for the admission was met for:   Discharge location: Yes-[facility]-SNF/REHAB-[facility]  Length of Stay: Yes-13 DAYS  Discharge activity level: Yes-SUPERVISION-MIN ASSIST LEVEL  Home/community participation: Yes  Services provided included: MD, RD, PT, OT, RN, CM, Pharmacy, and SW  Financial Services: Medicare and Private Insurance: [place] offered to/list presented to:PT  Follow-up services arranged: Other: SNF-[facility]  Comments (or additional information):PT DECIDED NEEDED MORE REHAB THEN WILL RETURN TO HER HOME ON [place] PROPERTY  Patient/Family verbalized understanding of follow-up arrangements: Yes  Individual responsible for coordination of the follow-up plan: LINDA-DAUGHTER [phone]  Confirmed correct DME delivered: M[place], [place] 04/14/2021    Elease Hashimoto

## 2021-04-14 NOTE — Plan of Care (Signed)
  Problem: RH Balance Goal: LTG Patient will maintain dynamic standing with ADLs (OT) Description: LTG:  Patient will maintain dynamic standing balance with assist during activities of daily living (OT)  Outcome: Adequate for Discharge Note: Pt requires intermittent CGA during dynamic standing tasks due to limitations in balance.   Problem: Sit to Stand Goal: LTG:  Patient will perform sit to stand in prep for activites of daily living with assistance level (OT) Description: LTG:  Patient will perform sit to stand in prep for activites of daily living with assistance level (OT) Outcome: Adequate for Discharge Note: Pt requires intermittent CGA during sit<>stand due to limitations in balance.   Problem: RH Bathing Goal: LTG Patient will bathe all body parts with assist levels (OT) Description: LTG: Patient will bathe all body parts with assist levels (OT) Outcome: Adequate for Discharge Note: Pt requires intermittent CGA during showering in shower while standing due to limitations in balance.   Problem: RH Dressing Goal: LTG Patient will perform upper body dressing (OT) Description: LTG Patient will perform upper body dressing with assist, with/without cues (OT). Outcome: Not Met (add Reason) Note: Pt requires set up A for safety due to requiring close spvsn for gathering clothing items for UB dressing.   Problem: RH Grooming Goal: LTG Patient will perform grooming w/assist,cues/equip (OT) Description: LTG: Patient will perform grooming with assist, with/without cues using equipment (OT) Outcome: Completed/Met   Problem: RH Dressing Goal: LTG Patient will perform lower body dressing w/assist (OT) Description: LTG: Patient will perform lower body dressing with assist, with/without cues in positioning using equipment (OT) Outcome: Completed/Met Note: Intermittent CGA due to balance deficits.    Problem: RH Toileting Goal: LTG Patient will perform toileting task (3/3 steps) with  assistance level (OT) Description: LTG: Patient will perform toileting task (3/3 steps) with assistance level (OT)  Outcome: Completed/Met   Problem: RH Simple Meal Prep Goal: LTG Patient will perform simple meal prep w/assist (OT) Description: LTG: Patient will perform simple meal prep with assistance, with/without cues (OT). Outcome: Completed/Met Note: Intermittent CGA if performed at ambulatory level.   Problem: RH Light Housekeeping Goal: LTG Patient will perform light housekeeping w/assist (OT) Description: LTG: Patient will perform light housekeeping with assistance, with/without cues (OT). Outcome: Completed/Met Note: Requires intermittent CGA due to balance deficits if performed at ambulatory level.   Problem: RH Toilet Transfers Goal: LTG Patient will perform toilet transfers w/assist (OT) Description: LTG: Patient will perform toilet transfers with assist, with/without cues using equipment (OT) Outcome: Completed/Met   Problem: RH Tub/Shower Transfers Goal: LTG Patient will perform tub/shower transfers w/assist (OT) Description: LTG: Patient will perform tub/shower transfers with assist, with/without cues using equipment (OT) Outcome: Completed/Met Note: Intermittent CGA required due to limitations in balance.   Problem: RH Furniture Transfers Goal: LTG Patient will perform furniture transfers w/assist (OT/PT) Description: LTG: Patient will perform furniture transfers  with assistance (OT/PT). Outcome: Completed/Met Note: Intermittent CGA due to balance limitations.

## 2021-04-14 NOTE — Progress Notes (Addendum)
Occupational Therapy Discharge Summary  Patient Details  Name: Maria [name] MRN: [id number] Date of Birth: [birth year]  Today's Date: 04/14/2021 OT Individual Time: 6568-1275 and 1700-1749 OT Individual Time Calculation (min): 53 min and 36 min   Patient has met 11 of 12 long term goals due to improved activity tolerance, improved balance, ability to compensate for deficits, and functional use of  LEFT lower extremity.  Patient to discharge at overall Supervision -CGA level.  Patient's care partner unavailable to provide the necessary physical assistance at discharge as pt lives alone at [place] and will d/c to SNF level.    Reasons goals not met: Pt did not meet LB dressing goal and 3 goals were set at 'adequate for d/c' level due to pt requiring intermittent CGA-spvsn for BADL tasks involving standing due to limited balance, endurance, and generalized weakness. Pt has improved in all these areas, but remains weak, unsteady on feet, and in the days before d/c had multiple instances of LOB which were self corrected with DME, grab bars, or min A from OT. For safe d/c to next level of care, pt to discharge at spvsn-CGA level.  Recommendation:  Patient will benefit from ongoing skilled OT services in skilled nursing facility setting to continue to advance functional skills in the area of BADL, iADL, and Reduce care partner burden and improve safety and fall prevention eventual d/c to home independently/intermittent spvsn.  Equipment: No equipment provided  Reasons for discharge: discharge from hospital  Patient/family agrees with progress made and goals achieved: Yes  OT Discharge  Precautions/Restrictions  Precautions Precautions: Fall Restrictions Weight Bearing Restrictions: No LLE Weight Bearing: Weight bearing as tolerated  Vital Signs Therapy Vitals Temp: 97.8 F (36.6 C) Temp Source: Oral Pulse Rate: 62 Resp: 16 BP: (!) 127/54 Oxygen Therapy SpO2: 95 % O2 Device: Room  Air Pain Pain Assessment Pain Scale: 0-10 Pain Score: 6  Pain Type: Acute pain Pain Location: Hip Pain Orientation: Left Pain Descriptors / Indicators: Aching;Sore Pain Frequency: Intermittent Pain Onset: With Activity Patients Stated Pain Goal: 0 Pain Intervention(s): Repositioned;Rest;Distraction;Emotional support Multiple Pain Sites: No ADL ADL Eating: Set up Where Assessed-Eating: Wheelchair, Bed level Grooming: Setup Where Assessed-Grooming: Sitting at sink, Wheelchair Upper Body Bathing: Supervision/safety Where Assessed-Upper Body Bathing: Shower Lower Body Bathing: Supervision/safety Where Assessed-Lower Body Bathing: Shower Upper Body Dressing: Minimal assistance Where Assessed-Upper Body Dressing: Edge of bed Lower Body Dressing: Supervision/safety Where Assessed-Lower Body Dressing: Edge of bed, Standing at sink Toileting: Supervision/safety Where Assessed-Toileting: Glass blower/designer: Close supervision Toilet Transfer Method: Counselling psychologist: Grab bars, Other (comment) (std height toilet) Tub/Shower Transfer: Not assessed Social research officer, government: Close supervision Social research officer, government Method: Heritage manager: Shower seat with back, Grab bars ADL Comments: Pt completes most ADLs at spvsn level, however, requires intermittent CGA at d/c due to decreased balance strategies and pain. Vision Baseline Vision/History: Wears glasses Wears Glasses: Reading only Patient Visual Report: No change from baseline Vision Assessment?: No apparent visual deficits Perception  Perception: Within Functional Limits Praxis Praxis: Intact Cognition Overall Cognitive Status: Within Functional Limits for tasks assessed Arousal/Alertness: Awake/alert Orientation Level: Oriented X4 Attention: Divided Divided Attention: Appears intact Memory: Impaired Memory Impairment: Decreased short term memory;Storage deficit Decreased Short  Term Memory: Verbal complex (pt demos slight decline in short term memory, but WFL for most tasks) Awareness: Appears intact Problem Solving: Appears intact Executive Function: Reasoning Reasoning: Appears intact Safety/Judgment: Other (comment) (Pt has fair safety awareness, but at times does not demonstrate  safe functional ambulation with RW keeping it too far forward or not turning with her during transfers despite vc's.) Sensation Sensation Light Touch: Impaired Detail Peripheral sensation comments: decreased due to neuropathy in feet Light Touch Impaired Details: Impaired RLE;Impaired LLE Hot/Cold: Appears Intact Proprioception: Appears Intact Stereognosis: Appears Intact Coordination Gross Motor Movements are Fluid and Coordinated: No Fine Motor Movements are Fluid and Coordinated: Yes Coordination and Movement Description: slower LLE due to pain Motor  Motor Motor: Other (comment) Motor - Discharge Observations: generalized weakness, but improved from eval Mobility  Bed Mobility Bed Mobility: Supine to Sit;Sit to Supine;Scooting to HOB Supine to Sit: Supervision/Verbal cueing Sit to Supine: Supervision/Verbal cueing Scooting to HOB: Supervision/Verbal Cueing Transfers Sit to Stand: Supervision/Verbal cueing;Contact Guard/Touching assist Stand to Sit: Supervision/Verbal cueing  Trunk/Postural Assessment  Cervical Assessment Cervical Assessment: Within Functional Limits Thoracic Assessment Thoracic Assessment: Exceptions to [facility] (kyphosis) Lumbar Assessment Lumbar Assessment: Within Functional Limits Postural Control Postural Control: Within Functional Limits  Balance Balance Balance Assessed: Yes Dynamic Sitting Balance Dynamic Sitting - Balance Support: No upper extremity supported;Feet supported;During functional activity Dynamic Sitting - Level of Assistance: 5: Stand by assistance Dynamic Sitting - Balance Activities: Lateral lean/weight shifting;Forward  lean/weight shifting;Reaching for objects;Reaching across midline;Ball toss Theatre stage manager Standing - Balance Support: During functional activity;No upper extremity supported Static Standing - Level of Assistance: 5: Stand by assistance;4: Min assist Dynamic Standing Balance Dynamic Standing - Balance Support: During functional activity;Right upper extremity supported;Left upper extremity supported;Bilateral upper extremity supported Dynamic Standing - Level of Assistance: 4: Min assist;5: Stand by assistance Dynamic Standing - Balance Activities: Lateral lean/weight shifting;Forward lean/weight shifting;Reaching for objects;Wii Dynamic Standing - Comments: Intermittent CGA to maintain balance Extremity/Trunk Assessment RUE Assessment RUE Assessment: Within Functional Limits General Strength Comments: WFL in functional tasks but generalized weakness LUE Assessment LUE Assessment: Within Functional Limits General Strength Comments: WFL in functional tasks but generalized weakness  Skilled Therapeutic Intervention: Session 1: Pt received supine in bed, agreeable to OT session, reporting no pain at start of session. Nutrition staff entering early in session; pt discussion at length with nutrition regarding ordering lunch/dinner. Sup<>sit close spvsn. Sit<>stand with RW close spvsn. Pt engaged in BADL morning routine gathering clothes at ambulatory level with RW with close spvsn and occasional CGA-min A to correct slight LOB when BUE unsupported reaching for objects. Pt gathered clothing items and completed UB/LB dressing EOB seated and in stance with close spvsn. However, min A to fasten bra with pt declining education/practicing donning with clasps in front and rotating bra around to back stating "I don't like doing it that way". Min vc's throughout for safe RW management and completing tasks requiring BUE use seated for safety. Pt completed grooming tasks seated at sink with set up  -mod I, close spvsn for standing level tasks at sink. Pt completed toileting at regular toilet with close spvsn. Pt remained supine in bed, alarm set, call bell in reach, and all needs met.   Session 2: Pt received supine in bed asleep, easy to arouse with voice, reporting fatigue, pain in L hip, and slight nausea, but agreeable to OT. Bed mobility and sit<>stand with RW completed with close spvsn. Pt ambulated with RW at CGA due to unsteadiness on feet and decreased balance bed>bathroom. Completed toileting at close spvsn level and washed hands standing at sink close spvsn. Pt taken to day room to participate in general conditioning, core strengthening, and dynamic sitting balance activities to improve functional mobility and strength/balance for  independence in BADLs. Stand pivot transfer w/c<>therapy mat and w/c>bed close spvsn. Pt engaged in 2lb dowel rod volley ball hits 20x and 25x ball toss in all directions reaching outside BOS while self correcting LOB. Pt reporting fatigue and nausea, discontinued task. Pt returned to room, remained supine in bed, all needs met, alarm set, and call bell in reach.    ZARIANA STRUB 04/14/2021, 4:39 PM

## 2021-04-14 NOTE — Progress Notes (Signed)
Physical Therapy Session Note  Patient Details  Name: Maria [name] MRN: [id number] Date of Birth: 1924-04-18  Today's Date: 04/14/2021 PT Individual Time: [id number]:[id number]; VZDGLOV5:[id number] PT Individual Time Calculation (min): 27 min & 55 min  Short Term Goals: Week 2:  PT Short Term Goal 1 (Week 2): STG=LTG due to ELOS  Skilled Therapeutic Interventions/Progress Updates:    Session1: Patient in supine and reports difficulty sleeping due to pain.  Performed supine to sit with S and donned jacket with min A.  Sit to stand with S and ambulation to dayroom with RW and S x 130' to hi-lo table.  Patient performed standing therex consisting of hip abduction, mini squats and heel raises all x 10 each with seated rest in between.  Assisted in w/c to room for energy conservation and pt stand step to bed with RW and S.  Sit to supine with S and adjusted up in bed as well. Left with call bell in reach and bed alarm active.  [place]:  Patient in supine and reports tried to eat some of her lunch.  Focus of session on balance, gait and functional mobility.  Patient S throughout session for bed mobility, transfers and gait with RW.  Noted at times L LE buckling with pt able to recover with S assist.  Patient negotiated curb step and ramp with RW and CGA.  Able to demonstrate car transfer using her spin disc with min A for LE's to simulated minivan height.  Patient ambulate in home environment over carpet and tile transition and around furniture performing transfer to rocker recliner with close S and cues for safety.  Patient ambulated over 160' in controlled environment and over 62' with home environment.  Assisted in w/c to room and patient stand step with RW to bed with S.  Left with call bell in reach and bed alarm active.   Therapy Documentation Precautions:  Precautions Precautions: Fall Restrictions Weight Bearing Restrictions: No LLE Weight Bearing: Weight bearing as tolerated  Pain: Pain  Assessment Pain Scale: 0-10 Pain Score: 5  Pain Type: Acute pain Pain Location: Hip Pain Orientation: Left Pain Descriptors / Indicators: Aching Pain Frequency: Intermittent Pain Onset: With Activity Patients Stated Pain Goal: 0 Pain Intervention(s): Repositioned;Rest   Therapy/Group: Individual Therapy  [name] [place], [place] 04/14/2021, 1:19 PM

## 2021-04-14 NOTE — Progress Notes (Signed)
Physical Therapy Discharge Summary  Patient Details  Name: Maria [name] MRN: [id number] Date of Birth: 1924-01-21  Today's Date: 04/14/2021  Patient has met 5 of 9 long term goals due to improved activity tolerance, improved balance, increased strength, and decreased pain.  Patient to discharge at an ambulatory level Supervision.   Patient's care partner is independent to provide the necessary physical assistance at discharge as patient discharging to SNF.  Reasons goals not met: Patient with continued weakness and pain in L hip needing min A for car transfers and CGA for negotiating curb step and supervision for bed mobility.  Recommendation:  Patient will benefit from ongoing skilled PT services in skilled nursing facility setting to continue to advance safe functional mobility, address ongoing impairments in balance, strength, and activity tolerance, and minimize fall risk.  Equipment: No equipment provided  Reasons for discharge: discharge from hospital  Patient/family agrees with progress made and goals achieved: Yes  PT Discharge Precautions/Restrictions Precautions Precautions: Fall Restrictions LLE Weight Bearing: Weight bearing as tolerated  Pain Pain Assessment Pain Scale: 0-10 Pain Score: 5  Pain Type: Acute pain Pain Location: Hip Pain Orientation: Left Pain Descriptors / Indicators: Aching Pain Frequency: Intermittent Pain Onset: With Activity Patients Stated Pain Goal: 0 Pain Intervention(s): Repositioned;Rest Vision/Perception  Perception Perception: Within Functional Limits Praxis Praxis: Intact  Cognition Overall Cognitive Status: Within Functional Limits for tasks assessed Arousal/Alertness: Awake/alert Orientation Level: Oriented X4 Memory Impairment: Decreased short term memory;Storage deficit Safety/Judgment: Impaired Comments: cues for safety reminders Sensation Sensation Light Touch: Impaired Detail Peripheral sensation comments:  decreased due to neuropathy in feet Light Touch Impaired Details: Impaired RLE;Impaired LLE Coordination Gross Motor Movements are Fluid and Coordinated: No Fine Motor Movements are Fluid and Coordinated: Yes Coordination and Movement Description: slower L LE due to pain Motor  Motor Motor: Other (comment) Motor - Discharge Observations: generalized weakness, but improved from eval  Mobility Bed Mobility Bed Mobility: Supine to Sit;Sit to Supine;Scooting to HOB Supine to Sit: Supervision/Verbal cueing Sit to Supine: Supervision/Verbal cueing Scooting to HOB: Supervision/Verbal Cueing Transfers Transfers: Sit to Stand;Stand to Sit;Stand Pivot Transfers Sit to Stand: Supervision/Verbal cueing Stand to Sit: Supervision/Verbal cueing Stand Pivot Transfers: Supervision/Verbal cueing Transfer (Assistive device): Rolling walker Locomotion  Gait Ambulation: Yes Gait Assistance: Supervision/Verbal cueing Gait Distance (Feet): 160 Feet Assistive device: Rolling walker Gait Gait: Yes Gait Pattern: Step-through pattern;Antalgic Stairs / Additional Locomotion Stairs Assistance: Contact Guard/Touching assist Stair Management Technique: With walker Number of Stairs: 1 Height of Stairs: 5 Ramp: Contact Guard/touching assist Curb: Contact Guard/Touching assist Wheelchair Mobility Wheelchair Mobility: No  Trunk/Postural Assessment  Cervical Assessment Cervical Assessment: Within Functional Limits Thoracic Assessment Thoracic Assessment: Exceptions to [facility] (kyphoscoliosis) Lumbar Assessment Lumbar Assessment: Exceptions to [facility] (scoliosis) Postural Control Postural Control: Within Functional Limits  Balance Balance Balance Assessed: Yes Dynamic Sitting Balance Dynamic Sitting - Balance Support: No upper extremity supported;Feet supported;During functional activity Dynamic Sitting - Level of Assistance: 5: Stand by assistance Static Standing Balance Static Standing - Balance  Support: During functional activity;Left upper extremity supported;Right upper extremity supported Static Standing - Level of Assistance: 5: Stand by assistance Dynamic Standing Balance Dynamic Standing - Level of Assistance: 5: Stand by assistance Extremity Assessment      RLE Assessment Active Range of Motion (AROM) Comments: WFL General Strength Comments: hip flexion 4/5 due to pain on L side, otherwise WFL LLE Assessment Active Range of Motion (AROM) Comments: grossly [facility] General Strength Comments: hip flexion 3+.5, knee extension 4/5, flexion 4-/5, ankle  DF 4+/5    [name] [place], [place] 04/14/2021, 2:13 PM

## 2021-04-14 NOTE — Progress Notes (Signed)
Patient ID: Maria [name], female   DOB: 1924-10-21, 87 y.o.   MRN: [id number] Met with pt to discuss discharge tomorrow and the plans. She wants to leave at 1:00 she thinks her daughter is coming to see her or meet her at [facility]. Have contacted [facility] to see if ok with this. She is aware someone will need to transport her plants there since ambulance can not. Aware of team conference and goals she has met while here.

## 2021-04-14 NOTE — Plan of Care (Signed)
  Problem: RH Balance Goal: LTG Patient will maintain dynamic sitting balance (PT) Description: LTG:  Patient will maintain dynamic sitting balance with assistance during mobility activities (PT) Outcome: Completed/Met Goal: LTG Patient will maintain dynamic standing balance (PT) Description: LTG:  Patient will maintain dynamic standing balance with assistance during mobility activities (PT) Outcome: Completed/Met   Problem: RH Bed Mobility Goal: LTG Patient will perform bed mobility with assist (PT) Description: LTG: Patient will perform bed mobility with assistance, with/without cues (PT). Outcome: Not Met (add Reason) Note: Needing supervision for safety    Problem: RH Bed to Chair Transfers Goal: LTG Patient will perform bed/chair transfers w/assist (PT) Description: LTG: Patient will perform bed to chair transfers with assistance (PT). Outcome: Not Met (add Reason) Note: Still needs supervision for safety   Problem: RH Car Transfers Goal: LTG Patient will perform car transfers with assist (PT) Description: LTG: Patient will perform car transfers with assistance (PT). Outcome: Not Met (add Reason) Note: Min A for safety to simulated van height due to LE weakness   Problem: RH Furniture Transfers Goal: LTG Patient will perform furniture transfers w/assist (OT/PT) Description: LTG: Patient will perform furniture transfers  with assistance (OT/PT). Outcome: Completed/Met   Problem: RH Ambulation Goal: LTG Patient will ambulate in controlled environment (PT) Description: LTG: Patient will ambulate in a controlled environment, # of feet with assistance (PT). Outcome: Completed/Met Goal: LTG Patient will ambulate in home environment (PT) Description: LTG: Patient will ambulate in home environment, # of feet with assistance (PT). Outcome: Completed/Met   Problem: RH Stairs Goal: LTG Patient will ambulate up and down stairs w/assist (PT) Description: LTG: Patient will ambulate up  and down # of stairs with assistance (PT) Outcome: Not Met (add Reason) Note: Needs CGA for safety  Magda Kiel, PT

## 2021-04-14 NOTE — Progress Notes (Signed)
PROGRESS NOTE   Subjective/Complaints: No complaints today Asks whether she can take her plants to [employer], SW states she has discussed with her they will need to be picked up by family Na improved to 132  ROS:   Pt denies SOB, abd pain, CP, N/V/C/D, and vision changes, +left hip pain   Objective:   No results found. No results for input(s): WBC, HGB, HCT, PLT in the last 72 hours.  Recent Labs    04/13/21 0700 04/14/21 0509  NA 128* 132*  K 4.7 4.2  CL 96* 95*  CO2 19* 24  GLUCOSE 80 85  BUN 11 13  CREATININE 0.67 0.70  CALCIUM 9.1 9.2     Intake/Output Summary (Last 24 hours) at 04/14/2021 1423 Last data filed at 04/14/2021 0807 Gross per 24 hour  Intake 240 ml  Output --  Net 240 ml        Physical Exam: Vital Signs Blood pressure (!) 167/72, pulse 66, temperature 98 F (36.7 C), temperature source Oral, resp. rate 17, height 5' 1.5" (1.562 m), weight 41.5 kg, SpO2 96 %. Gen: no distress, normal appearing HEENT: oral mucosa pink and moist, NCAT Cardio: Reg rate Chest: normal effort, normal rate of breathing Abd: soft, non-distended Extremities: No clubbing, cyanosis, or edema. Pulses are 2+ Psych: Pt's affect is appropriate. Pt is cooperative Skin: Clean and intact without signs of breakdown- L Hip incision C/D/I- looks good.  Neuro: Pt is cognitively appropriate with normal insight, memory, and awareness. Cranial nerves 2-12 are intact. Decreased LT/PP from mid calves distally. Reflexes are 1+ in all 4's. Fine motor coordination is intact. No tremors. Motor function is grossly 5/5 UE, 4/5 RLE, and 2-4/5 LLE prox to distal d/t pain..  Musculoskeletal: left hip with swelling. Tender along operative site and with PROM. Right SCM slightly tender with movement   Assessment/Plan: 1. Functional deficits which require 3+ hours per day of interdisciplinary therapy in a comprehensive inpatient rehab  setting. Physiatrist is providing close team supervision and 24 hour management of active medical problems listed below. Physiatrist and rehab team continue to assess barriers to discharge/monitor patient progress toward functional and medical goals  Care Tool:  Bathing    Body parts bathed by patient: Right arm, Left arm, Chest, Abdomen, Front perineal area, Buttocks, Right upper leg, Left upper leg, Right lower leg, Left lower leg, Face   Body parts bathed by helper: Left lower leg     Bathing assist Assist Level: Contact Guard/Touching assist (CGA when standing; close spvsn seated)     Upper Body Dressing/Undressing Upper body dressing   What is the patient wearing?: Pull over shirt, Bra    Upper body assist Assist Level: Minimal Assistance - Patient > 75% Assistive Device Comment: min A to fasten bra  Lower Body Dressing/Undressing Lower body dressing      What is the patient wearing?: Underwear/pull up, Pants     Lower body assist Assist for lower body dressing: Contact Guard/Touching assist     Toileting Toileting    Toileting assist Assist for toileting: Supervision/Verbal cueing     Transfers Chair/bed transfer  Transfers assist     Chair/bed transfer  assist level: Supervision/Verbal cueing     Locomotion Ambulation   Ambulation assist      Assist level: Supervision/Verbal cueing Assistive device: Walker-rolling Max distance: 180'   Walk 10 feet activity   Assist     Assist level: Supervision/Verbal cueing Assistive device: Walker-rolling   Walk 50 feet activity   Assist    Assist level: Supervision/Verbal cueing Assistive device: Walker-rolling    Walk 150 feet activity   Assist Walk 150 feet activity did not occur: Safety/medical concerns  Assist level: Supervision/Verbal cueing Assistive device: Walker-rolling    Walk 10 feet on uneven surface  activity   Assist     Assist level: Contact Guard/Touching  assist Assistive device: Walker-rolling   Wheelchair     Assist Will patient use wheelchair at discharge?: No Type of Wheelchair: Manual    Wheelchair assist level: Supervision/Verbal cueing Max wheelchair distance: 38'    Wheelchair 50 feet with 2 turns activity    Assist        Assist Level: Supervision/Verbal cueing   Wheelchair 150 feet activity     Assist  Wheelchair 150 feet activity did not occur: Safety/medical concerns       Blood pressure (!) 167/72, pulse 66, temperature 98 F (36.7 C), temperature source Oral, resp. rate 17, height 5' 1.5" (1.562 m), weight 41.5 kg, SpO2 96 %.  Medical Problem List and Plan: 1.  Impaired gait and function secondary L femur fx s/p percutaneous pinning- WBAT             -patient may  shower             -ELOS/Goals: 2-3 weeks- supervision to mod I  Continue PT and OT- WBAT on LLE  -Interdisciplinary Team Conference today   2.  Antithrombotics: -DVT/anticoagulation:  Continue Eliquis  -dopplers negative             -antiplatelet therapy:  N/A 3. Left femur fracture: Continue Tramadol scheduled 156m TID 4. Depression: continue zoloft.  5. Neuropsych: This patient is capable of making decisions on hee own behalf. 6. Skin/Wound Care: Routine pressure relief measures.             --Monitor incision for healing. 7. Hyponatremia: improving, continue to monitor outpatient.              -protein supp for low albumin  -recheck labs Monday 8. PAF: Monitor HR TID--continue Pacerone and Eliquis. Regular rhythm today  in RRR- con't regimen  -observe for tolerance with therapies 9. CAD s/p CABG/chronic diastolic CHF: Will check weights daily and monitor for signs of overload. Continue Lasix.             --heart healthy diet.     Filed Weights   04/11/21 0500 04/13/21 0455 04/14/21 0503  Weight: 43.3 kg 44.5 kg 41.5 kg    weight overall stable- con't regimen 10 HTN: SBP elevated and DBP soft: continue Lasix and  Norvasc. 11. H/o COPD/DOE:  Monitor for symptoms with increase in activity. 12. RLS/Chronic insomnia: Continue melatonin--takes 5-10 mg prn at home.   13. Idiopathic peripheral polyneuropathy: Reports that legs are numb/does not have sensation to urinate but voiding without difficulty. --has seen Dr. JTomi Likensin the past.   14. Celiac disease  Continue senna-docusate daily, miralax PRN.    LOS: 12 days A FACE TO FACE EVALUATION WAS PERFORMED  KClide DeutscherRaulkar 04/14/2021, 2:23 PM

## 2021-04-15 DIAGNOSIS — R2689 Other abnormalities of gait and mobility: Secondary | ICD-10-CM | POA: Diagnosis not present

## 2021-04-15 DIAGNOSIS — S72002D Fracture of unspecified part of neck of left femur, subsequent encounter for closed fracture with routine healing: Secondary | ICD-10-CM | POA: Diagnosis not present

## 2021-04-15 DIAGNOSIS — K219 Gastro-esophageal reflux disease without esophagitis: Secondary | ICD-10-CM | POA: Diagnosis not present

## 2021-04-15 DIAGNOSIS — M6281 Muscle weakness (generalized): Secondary | ICD-10-CM | POA: Diagnosis not present

## 2021-04-15 DIAGNOSIS — I5032 Chronic diastolic (congestive) heart failure: Secondary | ICD-10-CM | POA: Diagnosis not present

## 2021-04-15 DIAGNOSIS — D649 Anemia, unspecified: Secondary | ICD-10-CM | POA: Diagnosis not present

## 2021-04-15 DIAGNOSIS — I130 Hypertensive heart and chronic kidney disease with heart failure and stage 1 through stage 4 chronic kidney disease, or unspecified chronic kidney disease: Secondary | ICD-10-CM | POA: Diagnosis not present

## 2021-04-15 DIAGNOSIS — I10 Essential (primary) hypertension: Secondary | ICD-10-CM | POA: Diagnosis not present

## 2021-04-15 DIAGNOSIS — F32A Depression, unspecified: Secondary | ICD-10-CM | POA: Diagnosis not present

## 2021-04-15 DIAGNOSIS — R1312 Dysphagia, oropharyngeal phase: Secondary | ICD-10-CM | POA: Diagnosis not present

## 2021-04-15 DIAGNOSIS — E039 Hypothyroidism, unspecified: Secondary | ICD-10-CM | POA: Diagnosis not present

## 2021-04-15 DIAGNOSIS — N1831 Chronic kidney disease, stage 3a: Secondary | ICD-10-CM | POA: Diagnosis not present

## 2021-04-15 DIAGNOSIS — R531 Weakness: Secondary | ICD-10-CM | POA: Diagnosis not present

## 2021-04-15 DIAGNOSIS — E871 Hypo-osmolality and hyponatremia: Secondary | ICD-10-CM | POA: Diagnosis not present

## 2021-04-15 DIAGNOSIS — E440 Moderate protein-calorie malnutrition: Secondary | ICD-10-CM | POA: Diagnosis not present

## 2021-04-15 DIAGNOSIS — K900 Celiac disease: Secondary | ICD-10-CM | POA: Diagnosis not present

## 2021-04-15 DIAGNOSIS — I480 Paroxysmal atrial fibrillation: Secondary | ICD-10-CM | POA: Diagnosis not present

## 2021-04-15 DIAGNOSIS — Z7401 Bed confinement status: Secondary | ICD-10-CM | POA: Diagnosis not present

## 2021-04-15 DIAGNOSIS — R5381 Other malaise: Secondary | ICD-10-CM | POA: Diagnosis not present

## 2021-04-15 DIAGNOSIS — R278 Other lack of coordination: Secondary | ICD-10-CM | POA: Diagnosis not present

## 2021-04-15 MED ORDER — LEVOTHYROXINE SODIUM 50 MCG PO TABS: 25.0000 ug | ORAL_TABLET | Freq: Every day | ORAL | Status: DC

## 2021-04-15 MED ORDER — SENNOSIDES-DOCUSATE SODIUM 8.6-50 MG PO TABS: 1.0000 | ORAL_TABLET | Freq: Every day | ORAL | Status: DC

## 2021-04-15 MED ORDER — FAMOTIDINE 10 MG PO TABS: 10.0000 mg | ORAL_TABLET | Freq: Two times a day (BID) | ORAL | Status: DC

## 2021-04-15 MED ORDER — MELATONIN 5 MG PO TABS: 5.0000 mg | ORAL_TABLET | Freq: Every day | ORAL | 0 refills | Status: AC

## 2021-04-15 MED ORDER — CHOLECALCIFEROL 10 MCG (400 UNIT) PO TABS: 400.0000 [IU] | ORAL_TABLET | Freq: Every day | ORAL | 0 refills | Status: AC

## 2021-04-15 MED ORDER — TRAMADOL HCL 50 MG PO TABS: 100.0000 mg | ORAL_TABLET | Freq: Three times a day (TID) | ORAL | 0 refills | Status: DC

## 2021-04-15 NOTE — Progress Notes (Signed)
PROGRESS NOTE   Subjective/Complaints: No complaints today She is concerned about her plants and how she will get them. Son may be able to pick them up later. Discussed tramadol for pain, provided list of foods that can help with pain  ROS:   Pt denies SOB, abd pain, CP, N/V/C/D, and vision changes, +left hip fracture pain, +anxiety regarding her plants   Objective:   No results found. No results for input(s): WBC, HGB, HCT, PLT in the last 72 hours.  Recent Labs    04/13/21 0700 04/14/21 0509  NA 128* 132*  K 4.7 4.2  CL 96* 95*  CO2 19* 24  GLUCOSE 80 85  BUN 11 13  CREATININE 0.67 0.70  CALCIUM 9.1 9.2     Intake/Output Summary (Last 24 hours) at 04/15/2021 1015 Last data filed at 04/15/2021 0900 Gross per 24 hour  Intake 120 ml  Output --  Net 120 ml        Physical Exam: Vital Signs Blood pressure 129/61, pulse 60, temperature 98.2 F (36.8 C), resp. rate 16, height 5' 1.5" (1.562 m), weight 41.5 kg, SpO2 96 %. Gen: no distress, normal appearing HEENT: oral mucosa pink and moist, NCAT Cardio: Reg rate Chest: normal effort, normal rate of breathing Abd: soft, non-distended Extremities: No clubbing, cyanosis, or edema. Pulses are 2+ Psych: Pt's affect is appropriate. Pt is cooperative Skin: Clean and intact without signs of breakdown- L Hip incision C/D/I- looks good.  Neuro: Pt is cognitively appropriate with normal insight, memory, and awareness. Cranial nerves 2-12 are intact. Decreased LT/PP from mid calves distally. Reflexes are 1+ in all 4's. Fine motor coordination is intact. No tremors. Motor function is grossly 5/5 UE, 4/5 RLE, and 2-4/5 LLE prox to distal d/t pain..  Musculoskeletal: left hip with swelling. Tender along operative site and with PROM. Right SCM slightly tender with movement   Assessment/Plan: 1. Functional deficits which require 3+ hours per day of interdisciplinary therapy  in a comprehensive inpatient rehab setting. Physiatrist is providing close team supervision and 24 hour management of active medical problems listed below. Physiatrist and rehab team continue to assess barriers to discharge/monitor patient progress toward functional and medical goals  Care Tool:  Bathing    Body parts bathed by patient: Right arm, Left arm, Chest, Abdomen, Front perineal area, Buttocks, Right upper leg, Left upper leg, Right lower leg, Left lower leg, Face   Body parts bathed by helper: Left lower leg     Bathing assist Assist Level: Contact Guard/Touching assist     Upper Body Dressing/Undressing Upper body dressing   What is the patient wearing?: Pull over shirt, Bra    Upper body assist Assist Level: Minimal Assistance - Patient > 75% Assistive Device Comment: min A to fasten bra  Lower Body Dressing/Undressing Lower body dressing      What is the patient wearing?: Underwear/pull up, Pants     Lower body assist Assist for lower body dressing: Contact Guard/Touching assist     Toileting Toileting    Toileting assist Assist for toileting: Supervision/Verbal cueing     Transfers Chair/bed transfer  Transfers assist     Chair/bed transfer assist  level: Supervision/Verbal cueing     Locomotion Ambulation   Ambulation assist      Assist level: Supervision/Verbal cueing Assistive device: Walker-rolling Max distance: 160'   Walk 10 feet activity   Assist     Assist level: Supervision/Verbal cueing Assistive device: Walker-rolling   Walk 50 feet activity   Assist    Assist level: Supervision/Verbal cueing Assistive device: Walker-rolling    Walk 150 feet activity   Assist Walk 150 feet activity did not occur: Safety/medical concerns  Assist level: Supervision/Verbal cueing Assistive device: Walker-rolling    Walk 10 feet on uneven surface  activity   Assist     Assist level: Contact Guard/Touching  assist Assistive device: Walker-rolling   Wheelchair     Assist Will patient use wheelchair at discharge?: No Type of Wheelchair: Manual    Wheelchair assist level: Supervision/Verbal cueing Max wheelchair distance: 72'    Wheelchair 50 feet with 2 turns activity    Assist        Assist Level: Supervision/Verbal cueing   Wheelchair 150 feet activity     Assist  Wheelchair 150 feet activity did not occur: Safety/medical concerns       Blood pressure 129/61, pulse 60, temperature 98.2 F (36.8 C), resp. rate 16, height 5' 1.5" (1.562 m), weight 41.5 kg, SpO2 96 %.  Medical Problem List and Plan: 1.  Impaired gait and function secondary L femur fx s/p percutaneous pinning- WBAT             -patient may  shower             -ELOS/Goals: 2-3 weeks- supervision to mod I  Continue PT and OT- WBAT on LLE  2.  Antithrombotics: -DVT/anticoagulation:  Continue Eliquis  -dopplers negative             -antiplatelet therapy:  N/A 3. Left femur fracture: Continue Tramadol scheduled 164m TID. Provided list of foods that can help with pain.  4. Depression: continue zoloft.  5. Neuropsych: This patient is capable of making decisions on hee own behalf. 6. Skin/Wound Care: Routine pressure relief measures.             --Monitor incision for healing. 7. Hyponatremia: improving, continue to monitor outpatient.              -protein supp for low albumin  -recheck labs Monday 8. PAF: Monitor HR TID--continue Pacerone and Eliquis. Regular rhythm today  in RRR- con't regimen  -observe for tolerance with therapies 9. CAD s/p CABG/chronic diastolic CHF: Will check weights daily and monitor for signs of overload. Continue Lasix.             --heart healthy diet.     Filed Weights   04/11/21 0500 04/13/21 0455 04/14/21 0503  Weight: 43.3 kg 44.5 kg 41.5 kg    weight overall stable- con't regimen 10 HTN: SBP elevated and DBP soft: continue Lasix and Norvasc. 11. H/o COPD/DOE:   Monitor for symptoms with increase in activity. 12. RLS/Chronic insomnia: Continue melatonin--takes 5-10 mg prn at home.   13. Idiopathic peripheral polyneuropathy: Reports that legs are numb/does not have sensation to urinate but voiding without difficulty. --has seen Dr. JTomi Likensin the past.   14. Celiac disease  Continue senna-docusate daily, miralax PRN.    >30 minutes spent in discharge of patient including review of medications and follow-up appointments, physical examination, and in answering all patient's questions    LOS: 13 days A FACE TO FACE EVALUATION WAS  PERFORMED  Izora Ribas 04/15/2021, 10:15 AM

## 2021-04-16 DIAGNOSIS — F32A Depression, unspecified: Secondary | ICD-10-CM | POA: Diagnosis not present

## 2021-04-16 DIAGNOSIS — E039 Hypothyroidism, unspecified: Secondary | ICD-10-CM | POA: Diagnosis not present

## 2021-04-16 DIAGNOSIS — S72002D Fracture of unspecified part of neck of left femur, subsequent encounter for closed fracture with routine healing: Secondary | ICD-10-CM | POA: Diagnosis not present

## 2021-04-16 DIAGNOSIS — D649 Anemia, unspecified: Secondary | ICD-10-CM | POA: Diagnosis not present

## 2021-04-16 DIAGNOSIS — K900 Celiac disease: Secondary | ICD-10-CM | POA: Diagnosis not present

## 2021-04-16 DIAGNOSIS — K219 Gastro-esophageal reflux disease without esophagitis: Secondary | ICD-10-CM | POA: Diagnosis not present

## 2021-04-16 DIAGNOSIS — I10 Essential (primary) hypertension: Secondary | ICD-10-CM | POA: Diagnosis not present

## 2021-04-16 DIAGNOSIS — I480 Paroxysmal atrial fibrillation: Secondary | ICD-10-CM | POA: Diagnosis not present

## 2021-04-30 DIAGNOSIS — S72002D Fracture of unspecified part of neck of left femur, subsequent encounter for closed fracture with routine healing: Secondary | ICD-10-CM | POA: Diagnosis not present

## 2021-05-14 DIAGNOSIS — Z9181 History of falling: Secondary | ICD-10-CM | POA: Diagnosis not present

## 2021-05-14 DIAGNOSIS — J449 Chronic obstructive pulmonary disease, unspecified: Secondary | ICD-10-CM | POA: Diagnosis not present

## 2021-05-14 DIAGNOSIS — R278 Other lack of coordination: Secondary | ICD-10-CM | POA: Diagnosis not present

## 2021-05-14 DIAGNOSIS — S72002D Fracture of unspecified part of neck of left femur, subsequent encounter for closed fracture with routine healing: Secondary | ICD-10-CM | POA: Diagnosis not present

## 2021-05-14 DIAGNOSIS — H3530 Unspecified macular degeneration: Secondary | ICD-10-CM | POA: Diagnosis not present

## 2021-05-14 DIAGNOSIS — M6281 Muscle weakness (generalized): Secondary | ICD-10-CM | POA: Diagnosis not present

## 2021-05-26 DIAGNOSIS — S72002D Fracture of unspecified part of neck of left femur, subsequent encounter for closed fracture with routine healing: Secondary | ICD-10-CM | POA: Diagnosis not present

## 2021-05-26 DIAGNOSIS — I5032 Chronic diastolic (congestive) heart failure: Secondary | ICD-10-CM | POA: Diagnosis not present

## 2021-05-26 DIAGNOSIS — M6281 Muscle weakness (generalized): Secondary | ICD-10-CM | POA: Diagnosis not present

## 2021-05-26 DIAGNOSIS — J449 Chronic obstructive pulmonary disease, unspecified: Secondary | ICD-10-CM | POA: Diagnosis not present

## 2021-05-26 DIAGNOSIS — I480 Paroxysmal atrial fibrillation: Secondary | ICD-10-CM | POA: Diagnosis not present

## 2021-05-26 DIAGNOSIS — R2689 Other abnormalities of gait and mobility: Secondary | ICD-10-CM | POA: Diagnosis not present

## 2021-05-27 ENCOUNTER — Other Ambulatory Visit: Payer: Self-pay | Admitting: Interventional Cardiology

## 2021-05-27 DIAGNOSIS — I480 Paroxysmal atrial fibrillation: Secondary | ICD-10-CM | POA: Diagnosis not present

## 2021-05-27 DIAGNOSIS — S72002D Fracture of unspecified part of neck of left femur, subsequent encounter for closed fracture with routine healing: Secondary | ICD-10-CM | POA: Diagnosis not present

## 2021-05-27 DIAGNOSIS — J449 Chronic obstructive pulmonary disease, unspecified: Secondary | ICD-10-CM | POA: Diagnosis not present

## 2021-05-27 DIAGNOSIS — M6281 Muscle weakness (generalized): Secondary | ICD-10-CM | POA: Diagnosis not present

## 2021-05-27 DIAGNOSIS — I5032 Chronic diastolic (congestive) heart failure: Secondary | ICD-10-CM | POA: Diagnosis not present

## 2021-05-27 DIAGNOSIS — R2689 Other abnormalities of gait and mobility: Secondary | ICD-10-CM | POA: Diagnosis not present

## 2021-06-01 DIAGNOSIS — R2689 Other abnormalities of gait and mobility: Secondary | ICD-10-CM | POA: Diagnosis not present

## 2021-06-01 DIAGNOSIS — J449 Chronic obstructive pulmonary disease, unspecified: Secondary | ICD-10-CM | POA: Diagnosis not present

## 2021-06-01 DIAGNOSIS — S72002D Fracture of unspecified part of neck of left femur, subsequent encounter for closed fracture with routine healing: Secondary | ICD-10-CM | POA: Diagnosis not present

## 2021-06-01 DIAGNOSIS — I480 Paroxysmal atrial fibrillation: Secondary | ICD-10-CM | POA: Diagnosis not present

## 2021-06-01 DIAGNOSIS — I5032 Chronic diastolic (congestive) heart failure: Secondary | ICD-10-CM | POA: Diagnosis not present

## 2021-06-01 DIAGNOSIS — M6281 Muscle weakness (generalized): Secondary | ICD-10-CM | POA: Diagnosis not present

## 2021-06-03 DIAGNOSIS — M6281 Muscle weakness (generalized): Secondary | ICD-10-CM | POA: Diagnosis not present

## 2021-06-03 DIAGNOSIS — I5032 Chronic diastolic (congestive) heart failure: Secondary | ICD-10-CM | POA: Diagnosis not present

## 2021-06-03 DIAGNOSIS — J449 Chronic obstructive pulmonary disease, unspecified: Secondary | ICD-10-CM | POA: Diagnosis not present

## 2021-06-03 DIAGNOSIS — R2689 Other abnormalities of gait and mobility: Secondary | ICD-10-CM | POA: Diagnosis not present

## 2021-06-03 DIAGNOSIS — I480 Paroxysmal atrial fibrillation: Secondary | ICD-10-CM | POA: Diagnosis not present

## 2021-06-03 DIAGNOSIS — S72002D Fracture of unspecified part of neck of left femur, subsequent encounter for closed fracture with routine healing: Secondary | ICD-10-CM | POA: Diagnosis not present

## 2021-06-04 DIAGNOSIS — I480 Paroxysmal atrial fibrillation: Secondary | ICD-10-CM | POA: Diagnosis not present

## 2021-06-04 DIAGNOSIS — R2689 Other abnormalities of gait and mobility: Secondary | ICD-10-CM | POA: Diagnosis not present

## 2021-06-04 DIAGNOSIS — M6281 Muscle weakness (generalized): Secondary | ICD-10-CM | POA: Diagnosis not present

## 2021-06-04 DIAGNOSIS — J449 Chronic obstructive pulmonary disease, unspecified: Secondary | ICD-10-CM | POA: Diagnosis not present

## 2021-06-04 DIAGNOSIS — I5032 Chronic diastolic (congestive) heart failure: Secondary | ICD-10-CM | POA: Diagnosis not present

## 2021-06-04 DIAGNOSIS — S72002D Fracture of unspecified part of neck of left femur, subsequent encounter for closed fracture with routine healing: Secondary | ICD-10-CM | POA: Diagnosis not present

## 2021-06-08 DIAGNOSIS — R2689 Other abnormalities of gait and mobility: Secondary | ICD-10-CM | POA: Diagnosis not present

## 2021-06-08 DIAGNOSIS — J449 Chronic obstructive pulmonary disease, unspecified: Secondary | ICD-10-CM | POA: Diagnosis not present

## 2021-06-08 DIAGNOSIS — S72002D Fracture of unspecified part of neck of left femur, subsequent encounter for closed fracture with routine healing: Secondary | ICD-10-CM | POA: Diagnosis not present

## 2021-06-08 DIAGNOSIS — I5032 Chronic diastolic (congestive) heart failure: Secondary | ICD-10-CM | POA: Diagnosis not present

## 2021-06-08 DIAGNOSIS — I480 Paroxysmal atrial fibrillation: Secondary | ICD-10-CM | POA: Diagnosis not present

## 2021-06-08 DIAGNOSIS — M6281 Muscle weakness (generalized): Secondary | ICD-10-CM | POA: Diagnosis not present

## 2021-06-10 DIAGNOSIS — I480 Paroxysmal atrial fibrillation: Secondary | ICD-10-CM | POA: Diagnosis not present

## 2021-06-10 DIAGNOSIS — J449 Chronic obstructive pulmonary disease, unspecified: Secondary | ICD-10-CM | POA: Diagnosis not present

## 2021-06-10 DIAGNOSIS — S72002D Fracture of unspecified part of neck of left femur, subsequent encounter for closed fracture with routine healing: Secondary | ICD-10-CM | POA: Diagnosis not present

## 2021-06-10 DIAGNOSIS — R2689 Other abnormalities of gait and mobility: Secondary | ICD-10-CM | POA: Diagnosis not present

## 2021-06-10 DIAGNOSIS — I5032 Chronic diastolic (congestive) heart failure: Secondary | ICD-10-CM | POA: Diagnosis not present

## 2021-06-10 DIAGNOSIS — M6281 Muscle weakness (generalized): Secondary | ICD-10-CM | POA: Diagnosis not present

## 2021-06-12 DIAGNOSIS — S72002D Fracture of unspecified part of neck of left femur, subsequent encounter for closed fracture with routine healing: Secondary | ICD-10-CM | POA: Diagnosis not present

## 2021-06-12 DIAGNOSIS — I480 Paroxysmal atrial fibrillation: Secondary | ICD-10-CM | POA: Diagnosis not present

## 2021-06-12 DIAGNOSIS — I5032 Chronic diastolic (congestive) heart failure: Secondary | ICD-10-CM | POA: Diagnosis not present

## 2021-06-12 DIAGNOSIS — J449 Chronic obstructive pulmonary disease, unspecified: Secondary | ICD-10-CM | POA: Diagnosis not present

## 2021-06-12 DIAGNOSIS — R2689 Other abnormalities of gait and mobility: Secondary | ICD-10-CM | POA: Diagnosis not present

## 2021-06-12 DIAGNOSIS — M6281 Muscle weakness (generalized): Secondary | ICD-10-CM | POA: Diagnosis not present

## 2021-06-15 DIAGNOSIS — M6281 Muscle weakness (generalized): Secondary | ICD-10-CM | POA: Diagnosis not present

## 2021-06-15 DIAGNOSIS — J449 Chronic obstructive pulmonary disease, unspecified: Secondary | ICD-10-CM | POA: Diagnosis not present

## 2021-06-15 DIAGNOSIS — R2689 Other abnormalities of gait and mobility: Secondary | ICD-10-CM | POA: Diagnosis not present

## 2021-06-15 DIAGNOSIS — S72002D Fracture of unspecified part of neck of left femur, subsequent encounter for closed fracture with routine healing: Secondary | ICD-10-CM | POA: Diagnosis not present

## 2021-06-15 DIAGNOSIS — I480 Paroxysmal atrial fibrillation: Secondary | ICD-10-CM | POA: Diagnosis not present

## 2021-06-15 DIAGNOSIS — I5032 Chronic diastolic (congestive) heart failure: Secondary | ICD-10-CM | POA: Diagnosis not present

## 2021-06-16 DIAGNOSIS — I480 Paroxysmal atrial fibrillation: Secondary | ICD-10-CM | POA: Diagnosis not present

## 2021-06-16 DIAGNOSIS — S72002D Fracture of unspecified part of neck of left femur, subsequent encounter for closed fracture with routine healing: Secondary | ICD-10-CM | POA: Diagnosis not present

## 2021-06-16 DIAGNOSIS — R2689 Other abnormalities of gait and mobility: Secondary | ICD-10-CM | POA: Diagnosis not present

## 2021-06-16 DIAGNOSIS — I5032 Chronic diastolic (congestive) heart failure: Secondary | ICD-10-CM | POA: Diagnosis not present

## 2021-06-16 DIAGNOSIS — M6281 Muscle weakness (generalized): Secondary | ICD-10-CM | POA: Diagnosis not present

## 2021-06-16 DIAGNOSIS — J449 Chronic obstructive pulmonary disease, unspecified: Secondary | ICD-10-CM | POA: Diagnosis not present

## 2021-06-18 DIAGNOSIS — I5032 Chronic diastolic (congestive) heart failure: Secondary | ICD-10-CM | POA: Diagnosis not present

## 2021-06-18 DIAGNOSIS — M6281 Muscle weakness (generalized): Secondary | ICD-10-CM | POA: Diagnosis not present

## 2021-06-18 DIAGNOSIS — I480 Paroxysmal atrial fibrillation: Secondary | ICD-10-CM | POA: Diagnosis not present

## 2021-06-18 DIAGNOSIS — S72002D Fracture of unspecified part of neck of left femur, subsequent encounter for closed fracture with routine healing: Secondary | ICD-10-CM | POA: Diagnosis not present

## 2021-06-18 DIAGNOSIS — J449 Chronic obstructive pulmonary disease, unspecified: Secondary | ICD-10-CM | POA: Diagnosis not present

## 2021-06-18 DIAGNOSIS — R2689 Other abnormalities of gait and mobility: Secondary | ICD-10-CM | POA: Diagnosis not present

## 2021-06-22 DIAGNOSIS — S72002D Fracture of unspecified part of neck of left femur, subsequent encounter for closed fracture with routine healing: Secondary | ICD-10-CM | POA: Diagnosis not present

## 2021-06-22 DIAGNOSIS — R2689 Other abnormalities of gait and mobility: Secondary | ICD-10-CM | POA: Diagnosis not present

## 2021-06-22 DIAGNOSIS — I5032 Chronic diastolic (congestive) heart failure: Secondary | ICD-10-CM | POA: Diagnosis not present

## 2021-06-22 DIAGNOSIS — J449 Chronic obstructive pulmonary disease, unspecified: Secondary | ICD-10-CM | POA: Diagnosis not present

## 2021-06-22 DIAGNOSIS — I480 Paroxysmal atrial fibrillation: Secondary | ICD-10-CM | POA: Diagnosis not present

## 2021-06-22 DIAGNOSIS — M6281 Muscle weakness (generalized): Secondary | ICD-10-CM | POA: Diagnosis not present

## 2021-06-24 DIAGNOSIS — M6281 Muscle weakness (generalized): Secondary | ICD-10-CM | POA: Diagnosis not present

## 2021-06-24 DIAGNOSIS — R2689 Other abnormalities of gait and mobility: Secondary | ICD-10-CM | POA: Diagnosis not present

## 2021-06-24 DIAGNOSIS — I480 Paroxysmal atrial fibrillation: Secondary | ICD-10-CM | POA: Diagnosis not present

## 2021-06-24 DIAGNOSIS — I5032 Chronic diastolic (congestive) heart failure: Secondary | ICD-10-CM | POA: Diagnosis not present

## 2021-06-24 DIAGNOSIS — J449 Chronic obstructive pulmonary disease, unspecified: Secondary | ICD-10-CM | POA: Diagnosis not present

## 2021-06-24 DIAGNOSIS — S72002D Fracture of unspecified part of neck of left femur, subsequent encounter for closed fracture with routine healing: Secondary | ICD-10-CM | POA: Diagnosis not present

## 2021-06-25 DIAGNOSIS — M6281 Muscle weakness (generalized): Secondary | ICD-10-CM | POA: Diagnosis not present

## 2021-06-25 DIAGNOSIS — J449 Chronic obstructive pulmonary disease, unspecified: Secondary | ICD-10-CM | POA: Diagnosis not present

## 2021-06-25 DIAGNOSIS — R2689 Other abnormalities of gait and mobility: Secondary | ICD-10-CM | POA: Diagnosis not present

## 2021-06-25 DIAGNOSIS — S72002D Fracture of unspecified part of neck of left femur, subsequent encounter for closed fracture with routine healing: Secondary | ICD-10-CM | POA: Diagnosis not present

## 2021-06-25 DIAGNOSIS — I480 Paroxysmal atrial fibrillation: Secondary | ICD-10-CM | POA: Diagnosis not present

## 2021-06-25 DIAGNOSIS — I5032 Chronic diastolic (congestive) heart failure: Secondary | ICD-10-CM | POA: Diagnosis not present

## 2021-06-26 ENCOUNTER — Encounter
Payer: Medicare Other | Attending: Physical Medicine and Rehabilitation | Admitting: Physical Medicine and Rehabilitation

## 2021-06-26 ENCOUNTER — Encounter: Payer: Self-pay | Admitting: Physical Medicine and Rehabilitation

## 2021-06-26 ENCOUNTER — Encounter: Payer: Medicare Other | Admitting: Physical Medicine and Rehabilitation

## 2021-06-26 ENCOUNTER — Other Ambulatory Visit: Payer: Self-pay

## 2021-06-26 VITALS — BP 155/75 | Temp 97.8°F | Ht 61.0 in | Wt 100.2 lb

## 2021-06-26 DIAGNOSIS — R197 Diarrhea, unspecified: Secondary | ICD-10-CM

## 2021-06-26 DIAGNOSIS — S72002D Fracture of unspecified part of neck of left femur, subsequent encounter for closed fracture with routine healing: Secondary | ICD-10-CM | POA: Diagnosis not present

## 2021-06-26 DIAGNOSIS — R5383 Other fatigue: Secondary | ICD-10-CM

## 2021-06-26 NOTE — Patient Instructions (Signed)
Foods for pain: 1) Ginger (especially studied for arthritis)- reduce leukotriene production to decrease inflammation 2) Blueberries- high in phytonutrients that decrease inflammation 3) Salmon- marine omega-3s reduce joint swelling and pain 4) Pumpkin seeds- reduce inflammation 5) dark chocolate- reduces inflammation 6) turmeric- reduces inflammation 7) tart cherries - reduce pain and stiffness 8) extra virgin olive oil - its compound olecanthal helps to block prostaglandins  9) chili peppers- can be eaten or applied topically via capsaicin 10) mint- helpful for headache, muscle aches, joint pain, and itching 11) garlic- reduces inflammation  Link to further information on diet for chronic pain: [email or web address]

## 2021-06-26 NOTE — Progress Notes (Addendum)
Subjective:    Patient ID: Maria [name], female    DOB: [birth year], 87 y.o.   MRN: [id number]  HPI Maria [name] is an 87 year old woman who presents for CIR follow up after ;eft femur fracture.  -It has been hard for her at [facility] to find food given her Celiac disease- she thinks her fatigue and lethargy due to this. The have changed to use cornstarch in their soups. They have in the last three years given her sandwich bread they can eat.   -Average pain is 5/10 -she developed pain in her right hip -her pain is sharp, stabbing, and aching.  -She is worried that her Celiac Disease has come back.   Pain Inventory Average Pain 5 Pain Right Now 5 My pain is sharp, stabbing, and aching  In the last 24 hours, has pain interfered with the following? General activity 6 Relation with others 8 Enjoyment of life 6 What TIME of day is your pain at its worst? morning  and night Sleep (in general) Fair  Pain is worse with: walking, bending, and inactivity Pain improves with: rest and medication Relief from Meds: 6  walk with assistance use a walker ability to climb steps?  no do you drive?  yes  retired I need assistance with the following:  shopping  weakness anxiety loss of taste or smell  Hospital follow up  Hospital f/u    Family History  Problem Relation Age of Onset   Heart attack Mother    CVA Mother    CAD Father    Colon cancer Father    Heart attack Father    Heart attack Brother    Peripheral vascular disease Brother    AAA (abdominal aortic aneurysm) Brother    Social History   Socioeconomic History   Marital status: Widowed    Spouse name: Not on file   Number of children: Not on file   Years of education: Not on file   Highest education level: Not on file  Occupational History   Occupation: retired  Tobacco Use   Smoking status: Never   Smokeless tobacco: Never  Vaping Use   Vaping Use: Never used  Substance and Sexual Activity    Alcohol use: No   Drug use: No   Sexual activity: Yes    Birth control/protection: Surgical  Other Topics Concern   Not on file  Social History Narrative   Lives in retirement community   Right handed   Social Determinants of Health   Financial Resource Strain: Not on file  Food Insecurity: Not on file  Transportation Needs: Not on file  Physical Activity: Not on file  Stress: Not on file  Social Connections: Not on file   Past Surgical History:  Procedure Laterality Date   APPENDECTOMY     bilateral cataract surgery     CARDIAC CATHETERIZATION  08-06-13   CORONARY ARTERY BYPASS GRAFT N/A 08/28/2013   Procedure: CORONARY ARTERY BYPASS GRAFTING (CABG) x2 using right greater saphenous vein and left internal mammary artery. ;  Surgeon: Grace Isaac, MD;  Location: [place];  Service: Open Heart Surgery;  Laterality: N/A;   HIP ARTHROPLASTY Right 12/09/2013   Procedure: ARTHROPLASTY BIPOLAR HIP CEMENTED;  Surgeon: Kerin Salen, MD;  Location: [place];  Service: Orthopedics;  Laterality: Right;   HIP PINNING,CANNULATED Left 03/30/2021   Procedure: CANNULATED HIP PINNING;  Surgeon: Marchia Bond, MD;  Location: [place];  Service: Orthopedics;  Laterality: Left;  INTRAOPERATIVE TRANSESOPHAGEAL ECHOCARDIOGRAM N/A 08/28/2013   Procedure: INTRAOPERATIVE TRANSESOPHAGEAL ECHOCARDIOGRAM;  Surgeon: Grace Isaac, MD;  Location: [place];  Service: Open Heart Surgery;  Laterality: N/A;   LEFT AND RIGHT HEART CATHETERIZATION WITH CORONARY ANGIOGRAM N/A 08/16/2013   Procedure: LEFT AND RIGHT HEART CATHETERIZATION WITH CORONARY ANGIOGRAM;  Surgeon: [name], MD;  Location: [facility] CATH LAB;  Service: Cardiovascular;  Laterality: N/A;   MANDIBLE SURGERY     TCS     TONSILLECTOMY     trigger thumb     TUBAL LIGATION     Past Medical History:  Diagnosis Date   Anemia    Atonic bladder 12/11/2013   Carotid artery occlusion    right carotid stenosis 40-60%, 4/08, neg for stenosis repeat study  11/11   Celiac disease    Chronic anticoagulation CHADS2VASC2 score 7 10/18/2014   Chronic diastolic CHF (congestive heart failure) (HCC)    takes Lasix daily   CKD (chronic kidney disease), stage III ([place])    Coronary artery disease    a.  08/2013 (LIMA-LAD, rSVG to LCX and Lt atrial clip),   Depression    DNR (do not resuscitate) 03/28/2021   DVT (deep venous thrombosis) ([place]) 09/2013   Gastric ulcer    6-29month ago   GERD (gastroesophageal reflux disease)    takes Omeprazole daily   GI bleeding    Hemorrhoids    Hot flashes    takes ZOloft 3 times a week   Hypertension    Hypothyroidism    takes Synthroid daily as result of AMiodarone   Insomnia    Macular degeneration    Mild aortic insufficiency    a. Echo 10/2013: mild AI.   Moderate mitral regurgitation by prior echocardiogram    a. Echo 10/2013.   Moderate obstructive sleep apnea    uses CPAP;sleep study about 4-584monthago   MVP (mitral valve prolapse)    Osteoporosis    PAF (paroxysmal atrial fibrillation) (HCC)    Peripheral edema    left   Presbycusis    Pulmonary hypertension (HCC)    RLS (restless legs syndrome)    Thyroid nodule    28m71mno change 11/11   Urethral stricture    Urinary anastomotic stricture    Urinary retention    Vitamin D deficiency    BP (!) 155/75 (BP Location: Right Arm)   Temp 97.8 F (36.6 C) (Oral)   Ht 5' 1"  (1.549 m)   Wt 100 lb 3.2 oz (45.5 kg)   SpO2 96%   BMI 18.93 kg/m   Opioid Risk Score:   Fall Risk Score:  `1  Depression screen PHQ 2/9  Depression screen PHQ 2/9 03/25/2020  Decreased Interest 0  Down, Depressed, Hopeless 0  PHQ - 2 Score 0  Some recent data might be hidden       Review of Systems  Constitutional:  Positive for unexpected weight change.       Weight loss   HENT: Negative.    Eyes: Negative.   Respiratory: Negative.    Cardiovascular: Negative.   Gastrointestinal:  Positive for constipation, diarrhea and nausea.       Poor apetite   Endocrine: Negative.   Genitourinary: Negative.   Musculoskeletal:  Positive for back pain and gait problem.       Pain in left hip , pain posterior left leg , pain in lower back , pain  gluteal muscles , right hip pain   Skin: Negative.  Allergic/Immunologic: Negative.   Psychiatric/Behavioral:  The patient is nervous/anxious.       Objective:   Physical Exam Gen: no distress, normal appearing, BP 155/75, 100 lbs, BMI 18.93 HEENT: oral mucosa pink and moist, NCAT Cardio: Reg rate Chest: normal effort, normal rate of breathing Abd: soft, non-distended Ext: no edema Psych: pleasant, normal affect Skin: intact Neuro: Alert and oriented x3, sometimes forgetful Musculoskeletal: Antalgic gait with RW    Assessment & Plan:   Maria [name] is a 87 year old woman who presents for follow-up after CIR admission for left femur fracture.  1) Chronic Pain Syndrome secondary to bilateral hip pain.  -Discussed current symptoms of pain and history of pain.  -Discussed benefits of exercise in reducing pain. -Discussed following foods that may reduce pain: 1) Ginger (especially studied for arthritis)- reduce leukotriene production to decrease inflammation 2) Blueberries- high in phytonutrients that decrease inflammation 3) Salmon- marine omega-3s reduce joint swelling and pain 4) Pumpkin seeds- reduce inflammation 5) dark chocolate- reduces inflammation 6) turmeric- reduces inflammation 7) tart cherries - reduce pain and stiffness 8) extra virgin olive oil - its compound olecanthal helps to block prostaglandins  9) chili peppers- can be eaten or applied topically via capsaicin 10) mint- helpful for headache, muscle aches, joint pain, and itching 11) garlic- reduces inflammation  Link to further information on diet for chronic pain: [email or web address].randall.[email or web address]   2) Diarrhea: Continue Boost Avoid Ensure Discussed that this  could be contributing to her diarrhea.  Continue follow-up with GI and PCP  3) Fatigue: -Recommended starting NAC (N-Acetyl cysteine) 61m BID for her fatigue. Discussed its benefits in boosting glutathione and mitochondrial function.  4) Hard of hearing: -offered audiology referral.   5) HTN: -BP is ___today.  -Advised checking BP daily at home and logging results to bring into follow-up appointment with her PCP and myself. -Reviewed BP meds today.  -Advised regarding healthy foods that can help lower blood pressure and provided with a list: 1) citrus foods- high in vitamins and minerals 2) salmon and other fatty fish - reduces inflammation and oxylipins 3) swiss chard (leafy green)- high level of nitrates 4) pumpkin seeds- one of the best natural sources of magnesium 5) Beans and lentils- high in fiber, magnesium, and potassium 6) Berries- high in flavonoids 7) Amaranth (whole grain, can be cooked similarly to rice and oats)- high in magnesium and fiber 8) Pistachios- even more effective at reducing BP than other nuts 9) Carrots- high in phenolic compounds that relax blood vessels and reduce inflammation 10) Celery- contain phthalides that relax tissues of arterial walls 11) Tomatoes- can also improve cholesterol and reduce risk of heart disease 12) Broccoli- good source of magnesium, calcium, and potassium 13) Greek yogurt: high in potassium and calcium 14) Herbs and spices: Celery seed, cilantro, saffron, lemongrass, black cumin, ginseng, cinnamon, cardamom, sweet basil, and ginger 15) Chia and flax seeds- also help to lower cholesterol and blood sugar 16) Beets- high levels of nitrates that relax blood vessels  17) spinach and bananas- high in potassium  -Provided lise of supplements that can help with hypertension:  1) magnesium: one high quality brand is Bioptemizers since it contains all 7 types of magnesium, otherwise over the counter magnesium gluconate 4014mis a good  option 2) B vitamins 3) vitamin D 4) potassium 5) CoQ10 6) L-arginine 7) Vitamin C 8) Beetroot -Educated that goal BP is 120/80. -Made goal to incorporate some of the above foods into diet.

## 2021-06-30 DIAGNOSIS — R2689 Other abnormalities of gait and mobility: Secondary | ICD-10-CM | POA: Diagnosis not present

## 2021-06-30 DIAGNOSIS — I5032 Chronic diastolic (congestive) heart failure: Secondary | ICD-10-CM | POA: Diagnosis not present

## 2021-06-30 DIAGNOSIS — S72002D Fracture of unspecified part of neck of left femur, subsequent encounter for closed fracture with routine healing: Secondary | ICD-10-CM | POA: Diagnosis not present

## 2021-06-30 DIAGNOSIS — M6281 Muscle weakness (generalized): Secondary | ICD-10-CM | POA: Diagnosis not present

## 2021-06-30 DIAGNOSIS — J449 Chronic obstructive pulmonary disease, unspecified: Secondary | ICD-10-CM | POA: Diagnosis not present

## 2021-06-30 DIAGNOSIS — I480 Paroxysmal atrial fibrillation: Secondary | ICD-10-CM | POA: Diagnosis not present

## 2021-07-01 DIAGNOSIS — J449 Chronic obstructive pulmonary disease, unspecified: Secondary | ICD-10-CM | POA: Diagnosis not present

## 2021-07-01 DIAGNOSIS — S72002D Fracture of unspecified part of neck of left femur, subsequent encounter for closed fracture with routine healing: Secondary | ICD-10-CM | POA: Diagnosis not present

## 2021-07-01 DIAGNOSIS — I480 Paroxysmal atrial fibrillation: Secondary | ICD-10-CM | POA: Diagnosis not present

## 2021-07-01 DIAGNOSIS — M6281 Muscle weakness (generalized): Secondary | ICD-10-CM | POA: Diagnosis not present

## 2021-07-01 DIAGNOSIS — M545 Low back pain, unspecified: Secondary | ICD-10-CM | POA: Diagnosis not present

## 2021-07-01 DIAGNOSIS — I5032 Chronic diastolic (congestive) heart failure: Secondary | ICD-10-CM | POA: Diagnosis not present

## 2021-07-01 DIAGNOSIS — R2689 Other abnormalities of gait and mobility: Secondary | ICD-10-CM | POA: Diagnosis not present

## 2021-07-03 DIAGNOSIS — J449 Chronic obstructive pulmonary disease, unspecified: Secondary | ICD-10-CM | POA: Diagnosis not present

## 2021-07-03 DIAGNOSIS — I480 Paroxysmal atrial fibrillation: Secondary | ICD-10-CM | POA: Diagnosis not present

## 2021-07-03 DIAGNOSIS — R2689 Other abnormalities of gait and mobility: Secondary | ICD-10-CM | POA: Diagnosis not present

## 2021-07-03 DIAGNOSIS — I5032 Chronic diastolic (congestive) heart failure: Secondary | ICD-10-CM | POA: Diagnosis not present

## 2021-07-03 DIAGNOSIS — M6281 Muscle weakness (generalized): Secondary | ICD-10-CM | POA: Diagnosis not present

## 2021-07-03 DIAGNOSIS — S72002D Fracture of unspecified part of neck of left femur, subsequent encounter for closed fracture with routine healing: Secondary | ICD-10-CM | POA: Diagnosis not present

## 2021-07-07 DIAGNOSIS — M6281 Muscle weakness (generalized): Secondary | ICD-10-CM | POA: Diagnosis not present

## 2021-07-07 DIAGNOSIS — I5032 Chronic diastolic (congestive) heart failure: Secondary | ICD-10-CM | POA: Diagnosis not present

## 2021-07-07 DIAGNOSIS — R2689 Other abnormalities of gait and mobility: Secondary | ICD-10-CM | POA: Diagnosis not present

## 2021-07-07 DIAGNOSIS — S72002D Fracture of unspecified part of neck of left femur, subsequent encounter for closed fracture with routine healing: Secondary | ICD-10-CM | POA: Diagnosis not present

## 2021-07-07 DIAGNOSIS — J449 Chronic obstructive pulmonary disease, unspecified: Secondary | ICD-10-CM | POA: Diagnosis not present

## 2021-07-07 DIAGNOSIS — I480 Paroxysmal atrial fibrillation: Secondary | ICD-10-CM | POA: Diagnosis not present

## 2021-07-08 DIAGNOSIS — R2689 Other abnormalities of gait and mobility: Secondary | ICD-10-CM | POA: Diagnosis not present

## 2021-07-08 DIAGNOSIS — I480 Paroxysmal atrial fibrillation: Secondary | ICD-10-CM | POA: Diagnosis not present

## 2021-07-08 DIAGNOSIS — J449 Chronic obstructive pulmonary disease, unspecified: Secondary | ICD-10-CM | POA: Diagnosis not present

## 2021-07-08 DIAGNOSIS — S72002D Fracture of unspecified part of neck of left femur, subsequent encounter for closed fracture with routine healing: Secondary | ICD-10-CM | POA: Diagnosis not present

## 2021-07-08 DIAGNOSIS — M6281 Muscle weakness (generalized): Secondary | ICD-10-CM | POA: Diagnosis not present

## 2021-07-08 DIAGNOSIS — I5032 Chronic diastolic (congestive) heart failure: Secondary | ICD-10-CM | POA: Diagnosis not present

## 2021-07-13 DIAGNOSIS — J449 Chronic obstructive pulmonary disease, unspecified: Secondary | ICD-10-CM | POA: Diagnosis not present

## 2021-07-13 DIAGNOSIS — S72002D Fracture of unspecified part of neck of left femur, subsequent encounter for closed fracture with routine healing: Secondary | ICD-10-CM | POA: Diagnosis not present

## 2021-07-13 DIAGNOSIS — I480 Paroxysmal atrial fibrillation: Secondary | ICD-10-CM | POA: Diagnosis not present

## 2021-07-13 DIAGNOSIS — R2689 Other abnormalities of gait and mobility: Secondary | ICD-10-CM | POA: Diagnosis not present

## 2021-07-13 DIAGNOSIS — I5032 Chronic diastolic (congestive) heart failure: Secondary | ICD-10-CM | POA: Diagnosis not present

## 2021-07-13 DIAGNOSIS — M6281 Muscle weakness (generalized): Secondary | ICD-10-CM | POA: Diagnosis not present

## 2021-07-14 DIAGNOSIS — J449 Chronic obstructive pulmonary disease, unspecified: Secondary | ICD-10-CM | POA: Diagnosis not present

## 2021-07-14 DIAGNOSIS — R509 Fever, unspecified: Secondary | ICD-10-CM | POA: Diagnosis not present

## 2021-07-14 DIAGNOSIS — J069 Acute upper respiratory infection, unspecified: Secondary | ICD-10-CM | POA: Diagnosis not present

## 2021-07-14 DIAGNOSIS — S72002D Fracture of unspecified part of neck of left femur, subsequent encounter for closed fracture with routine healing: Secondary | ICD-10-CM | POA: Diagnosis not present

## 2021-07-14 DIAGNOSIS — M6281 Muscle weakness (generalized): Secondary | ICD-10-CM | POA: Diagnosis not present

## 2021-07-14 DIAGNOSIS — R2689 Other abnormalities of gait and mobility: Secondary | ICD-10-CM | POA: Diagnosis not present

## 2021-07-14 DIAGNOSIS — I480 Paroxysmal atrial fibrillation: Secondary | ICD-10-CM | POA: Diagnosis not present

## 2021-07-14 DIAGNOSIS — Z20822 Contact with and (suspected) exposure to covid-19: Secondary | ICD-10-CM | POA: Diagnosis not present

## 2021-07-14 DIAGNOSIS — I5032 Chronic diastolic (congestive) heart failure: Secondary | ICD-10-CM | POA: Diagnosis not present

## 2021-07-27 DIAGNOSIS — M6281 Muscle weakness (generalized): Secondary | ICD-10-CM | POA: Diagnosis not present

## 2021-07-27 DIAGNOSIS — S72002D Fracture of unspecified part of neck of left femur, subsequent encounter for closed fracture with routine healing: Secondary | ICD-10-CM | POA: Diagnosis not present

## 2021-07-27 DIAGNOSIS — I480 Paroxysmal atrial fibrillation: Secondary | ICD-10-CM | POA: Diagnosis not present

## 2021-07-27 DIAGNOSIS — R2689 Other abnormalities of gait and mobility: Secondary | ICD-10-CM | POA: Diagnosis not present

## 2021-07-27 DIAGNOSIS — I5032 Chronic diastolic (congestive) heart failure: Secondary | ICD-10-CM | POA: Diagnosis not present

## 2021-07-27 DIAGNOSIS — J449 Chronic obstructive pulmonary disease, unspecified: Secondary | ICD-10-CM | POA: Diagnosis not present

## 2021-07-29 DIAGNOSIS — I480 Paroxysmal atrial fibrillation: Secondary | ICD-10-CM | POA: Diagnosis not present

## 2021-07-29 DIAGNOSIS — M6281 Muscle weakness (generalized): Secondary | ICD-10-CM | POA: Diagnosis not present

## 2021-07-29 DIAGNOSIS — R2689 Other abnormalities of gait and mobility: Secondary | ICD-10-CM | POA: Diagnosis not present

## 2021-07-29 DIAGNOSIS — I5032 Chronic diastolic (congestive) heart failure: Secondary | ICD-10-CM | POA: Diagnosis not present

## 2021-07-29 DIAGNOSIS — J449 Chronic obstructive pulmonary disease, unspecified: Secondary | ICD-10-CM | POA: Diagnosis not present

## 2021-07-29 DIAGNOSIS — S72002D Fracture of unspecified part of neck of left femur, subsequent encounter for closed fracture with routine healing: Secondary | ICD-10-CM | POA: Diagnosis not present

## 2021-07-30 DIAGNOSIS — I5032 Chronic diastolic (congestive) heart failure: Secondary | ICD-10-CM | POA: Diagnosis not present

## 2021-07-30 DIAGNOSIS — R2689 Other abnormalities of gait and mobility: Secondary | ICD-10-CM | POA: Diagnosis not present

## 2021-07-30 DIAGNOSIS — S72002D Fracture of unspecified part of neck of left femur, subsequent encounter for closed fracture with routine healing: Secondary | ICD-10-CM | POA: Diagnosis not present

## 2021-07-30 DIAGNOSIS — M6281 Muscle weakness (generalized): Secondary | ICD-10-CM | POA: Diagnosis not present

## 2021-07-30 DIAGNOSIS — J449 Chronic obstructive pulmonary disease, unspecified: Secondary | ICD-10-CM | POA: Diagnosis not present

## 2021-07-30 DIAGNOSIS — I480 Paroxysmal atrial fibrillation: Secondary | ICD-10-CM | POA: Diagnosis not present

## 2021-08-05 DIAGNOSIS — S72002D Fracture of unspecified part of neck of left femur, subsequent encounter for closed fracture with routine healing: Secondary | ICD-10-CM | POA: Diagnosis not present

## 2021-08-05 DIAGNOSIS — I5032 Chronic diastolic (congestive) heart failure: Secondary | ICD-10-CM | POA: Diagnosis not present

## 2021-08-05 DIAGNOSIS — I480 Paroxysmal atrial fibrillation: Secondary | ICD-10-CM | POA: Diagnosis not present

## 2021-08-05 DIAGNOSIS — R2689 Other abnormalities of gait and mobility: Secondary | ICD-10-CM | POA: Diagnosis not present

## 2021-08-05 DIAGNOSIS — M6281 Muscle weakness (generalized): Secondary | ICD-10-CM | POA: Diagnosis not present

## 2021-08-05 DIAGNOSIS — J449 Chronic obstructive pulmonary disease, unspecified: Secondary | ICD-10-CM | POA: Diagnosis not present

## 2021-08-06 DIAGNOSIS — I5032 Chronic diastolic (congestive) heart failure: Secondary | ICD-10-CM | POA: Diagnosis not present

## 2021-08-06 DIAGNOSIS — J449 Chronic obstructive pulmonary disease, unspecified: Secondary | ICD-10-CM | POA: Diagnosis not present

## 2021-08-06 DIAGNOSIS — S72002D Fracture of unspecified part of neck of left femur, subsequent encounter for closed fracture with routine healing: Secondary | ICD-10-CM | POA: Diagnosis not present

## 2021-08-06 DIAGNOSIS — I480 Paroxysmal atrial fibrillation: Secondary | ICD-10-CM | POA: Diagnosis not present

## 2021-08-06 DIAGNOSIS — R2689 Other abnormalities of gait and mobility: Secondary | ICD-10-CM | POA: Diagnosis not present

## 2021-08-06 DIAGNOSIS — M6281 Muscle weakness (generalized): Secondary | ICD-10-CM | POA: Diagnosis not present

## 2021-08-10 DIAGNOSIS — S72002D Fracture of unspecified part of neck of left femur, subsequent encounter for closed fracture with routine healing: Secondary | ICD-10-CM | POA: Diagnosis not present

## 2021-08-10 DIAGNOSIS — J449 Chronic obstructive pulmonary disease, unspecified: Secondary | ICD-10-CM | POA: Diagnosis not present

## 2021-08-10 DIAGNOSIS — I5032 Chronic diastolic (congestive) heart failure: Secondary | ICD-10-CM | POA: Diagnosis not present

## 2021-08-10 DIAGNOSIS — I480 Paroxysmal atrial fibrillation: Secondary | ICD-10-CM | POA: Diagnosis not present

## 2021-08-10 DIAGNOSIS — R2689 Other abnormalities of gait and mobility: Secondary | ICD-10-CM | POA: Diagnosis not present

## 2021-08-10 DIAGNOSIS — M6281 Muscle weakness (generalized): Secondary | ICD-10-CM | POA: Diagnosis not present

## 2021-08-11 DIAGNOSIS — I5032 Chronic diastolic (congestive) heart failure: Secondary | ICD-10-CM | POA: Diagnosis not present

## 2021-08-11 DIAGNOSIS — I480 Paroxysmal atrial fibrillation: Secondary | ICD-10-CM | POA: Diagnosis not present

## 2021-08-11 DIAGNOSIS — S72002D Fracture of unspecified part of neck of left femur, subsequent encounter for closed fracture with routine healing: Secondary | ICD-10-CM | POA: Diagnosis not present

## 2021-08-11 DIAGNOSIS — J449 Chronic obstructive pulmonary disease, unspecified: Secondary | ICD-10-CM | POA: Diagnosis not present

## 2021-08-11 DIAGNOSIS — M6281 Muscle weakness (generalized): Secondary | ICD-10-CM | POA: Diagnosis not present

## 2021-08-11 DIAGNOSIS — R2689 Other abnormalities of gait and mobility: Secondary | ICD-10-CM | POA: Diagnosis not present

## 2021-08-12 ENCOUNTER — Other Ambulatory Visit: Payer: Self-pay | Admitting: Interventional Cardiology

## 2021-08-12 DIAGNOSIS — M7061 Trochanteric bursitis, right hip: Secondary | ICD-10-CM | POA: Diagnosis not present

## 2021-08-12 DIAGNOSIS — M7052 Other bursitis of knee, left knee: Secondary | ICD-10-CM | POA: Diagnosis not present

## 2021-08-12 DIAGNOSIS — M7062 Trochanteric bursitis, left hip: Secondary | ICD-10-CM | POA: Diagnosis not present

## 2021-08-13 DIAGNOSIS — I5032 Chronic diastolic (congestive) heart failure: Secondary | ICD-10-CM | POA: Diagnosis not present

## 2021-08-13 DIAGNOSIS — R2689 Other abnormalities of gait and mobility: Secondary | ICD-10-CM | POA: Diagnosis not present

## 2021-08-13 DIAGNOSIS — M6281 Muscle weakness (generalized): Secondary | ICD-10-CM | POA: Diagnosis not present

## 2021-08-13 DIAGNOSIS — J449 Chronic obstructive pulmonary disease, unspecified: Secondary | ICD-10-CM | POA: Diagnosis not present

## 2021-08-13 DIAGNOSIS — S72002D Fracture of unspecified part of neck of left femur, subsequent encounter for closed fracture with routine healing: Secondary | ICD-10-CM | POA: Diagnosis not present

## 2021-08-13 DIAGNOSIS — I480 Paroxysmal atrial fibrillation: Secondary | ICD-10-CM | POA: Diagnosis not present

## 2021-08-17 DIAGNOSIS — J449 Chronic obstructive pulmonary disease, unspecified: Secondary | ICD-10-CM | POA: Diagnosis not present

## 2021-08-17 DIAGNOSIS — M6281 Muscle weakness (generalized): Secondary | ICD-10-CM | POA: Diagnosis not present

## 2021-08-17 DIAGNOSIS — I480 Paroxysmal atrial fibrillation: Secondary | ICD-10-CM | POA: Diagnosis not present

## 2021-08-17 DIAGNOSIS — S72002D Fracture of unspecified part of neck of left femur, subsequent encounter for closed fracture with routine healing: Secondary | ICD-10-CM | POA: Diagnosis not present

## 2021-08-17 DIAGNOSIS — I5032 Chronic diastolic (congestive) heart failure: Secondary | ICD-10-CM | POA: Diagnosis not present

## 2021-08-17 DIAGNOSIS — R2689 Other abnormalities of gait and mobility: Secondary | ICD-10-CM | POA: Diagnosis not present

## 2021-08-19 DIAGNOSIS — R2689 Other abnormalities of gait and mobility: Secondary | ICD-10-CM | POA: Diagnosis not present

## 2021-08-19 DIAGNOSIS — I480 Paroxysmal atrial fibrillation: Secondary | ICD-10-CM | POA: Diagnosis not present

## 2021-08-19 DIAGNOSIS — M6281 Muscle weakness (generalized): Secondary | ICD-10-CM | POA: Diagnosis not present

## 2021-08-19 DIAGNOSIS — J449 Chronic obstructive pulmonary disease, unspecified: Secondary | ICD-10-CM | POA: Diagnosis not present

## 2021-08-19 DIAGNOSIS — S72002D Fracture of unspecified part of neck of left femur, subsequent encounter for closed fracture with routine healing: Secondary | ICD-10-CM | POA: Diagnosis not present

## 2021-08-19 DIAGNOSIS — I5032 Chronic diastolic (congestive) heart failure: Secondary | ICD-10-CM | POA: Diagnosis not present

## 2021-08-20 DIAGNOSIS — M6281 Muscle weakness (generalized): Secondary | ICD-10-CM | POA: Diagnosis not present

## 2021-08-20 DIAGNOSIS — I5032 Chronic diastolic (congestive) heart failure: Secondary | ICD-10-CM | POA: Diagnosis not present

## 2021-08-20 DIAGNOSIS — R2689 Other abnormalities of gait and mobility: Secondary | ICD-10-CM | POA: Diagnosis not present

## 2021-08-20 DIAGNOSIS — S72002D Fracture of unspecified part of neck of left femur, subsequent encounter for closed fracture with routine healing: Secondary | ICD-10-CM | POA: Diagnosis not present

## 2021-08-20 DIAGNOSIS — J449 Chronic obstructive pulmonary disease, unspecified: Secondary | ICD-10-CM | POA: Diagnosis not present

## 2021-08-20 DIAGNOSIS — I480 Paroxysmal atrial fibrillation: Secondary | ICD-10-CM | POA: Diagnosis not present

## 2021-09-09 DIAGNOSIS — M7062 Trochanteric bursitis, left hip: Secondary | ICD-10-CM | POA: Diagnosis not present

## 2021-09-09 DIAGNOSIS — M7052 Other bursitis of knee, left knee: Secondary | ICD-10-CM | POA: Diagnosis not present

## 2021-09-09 DIAGNOSIS — M7061 Trochanteric bursitis, right hip: Secondary | ICD-10-CM | POA: Diagnosis not present

## 2021-09-14 ENCOUNTER — Telehealth: Payer: Self-pay | Admitting: Interventional Cardiology

## 2021-09-14 NOTE — Telephone Encounter (Signed)
Pt c/o medication issue:  1. Name of Medication: Methylprednisolone  2. How are you currently taking this medication (dosage and times per day)? 6 tablets on first day, second day is 5 tablets etc.  3. Are you having a reaction (difficulty breathing--STAT)? no  4. What is your medication issue? Patient states she had an episode last night of a racing heart and high BP. She says her BP went up to 190/89 and is not sure if this was caused by her steroid. She says the medication is not prescribed by Dr. [name], but the doctor who prescribes it does not know her well. She says she was supposed to take 2 tablets today, but is concerned about another episode. She says she has only had 1 so far. She also says she is leaving town tomorrow morning around 9:45 am and would like a call back before she leaves. She says everything she has been told about the steroid says not to stop it, but she is concerned about her symptoms.

## 2021-09-14 NOTE — Telephone Encounter (Signed)
Spoke w patient.   She had hip sx in June Got Covid in July Took antiviral and got covid again.  Having muscle pain/soreness in hip so she started last Thursday a medrol dose pack. - by Dr. Mardelle Matte at [facility]. She feels that last night started afib s/s Woke up during the night heart was racing.  Her BP was elevated and she felt dizzy.  [place] [place] [facility] and so pulled emergency cord.  The staff that came thought hypertensive crisis.  She drank milk and took melatonin and then went back to sleep around 4:30 am.  She has gotten a lot of relief from the prednisone.  I reviewed with Dr. Irish Lack.  He recommended it is ok to finish out the course since it is helping her.  Today she is scheduled to take 2 tabs and tomorrow 1 tab.  Dr. Irish Lack said alternatively she could take 1 pill daily for 3 days to stretch it out.   I called her back and let her know this.  She would like to schedule with Dr. [name] as soon as possible as she has been unable to come for regular visits due to Covid (the facility was recommending not to have in person appointments unless sick).  She is aware I will let Dr. Thompson Caul nurse know and we will work on getting her an afternoon appointment.

## 2021-09-14 NOTE — Telephone Encounter (Signed)
Spoke with pt and scheduled her to see Dr. [name] on 11/29.  Pt appreciative for call.

## 2021-09-20 NOTE — Progress Notes (Deleted)
Cardiology Office Note:    Date:  09/20/2021   ID:  Maria [name], DOB March 27, 1924, Maria [name]  PCP:  [name], MD  Cardiologist:  [name], MD   Referring MD: [name], MD   No chief complaint on file.   History of Present Illness:    Maria [name] is a 87 y.o. female with a hx of paroxysmal atrial fibrillation, amiodarone therapy, bilateral carotid stenosis, coronary artery disease with multivessel coronary bypass grafting 2014, chronic kidney disease, chronic anticoagulation with Xarelto, Celiac disease, and hypertension.  Recent TIA leading to switch from Xarelto to apixaban 2.5 mg twice daily.   ***  Past Medical History:  Diagnosis Date   Anemia    Atonic bladder 12/11/2013   Carotid artery occlusion    right carotid stenosis 40-60%, 4/08, neg for stenosis repeat study 11/11   Celiac disease    Chronic anticoagulation CHADS2VASC2 score 7 10/18/2014   Chronic diastolic CHF (congestive heart failure) (HCC)    takes Lasix daily   CKD (chronic kidney disease), stage III ([place])    Coronary artery disease    a.  08/2013 (LIMA-LAD, rSVG to LCX and Lt atrial clip),   Depression    DNR (do not resuscitate) 03/28/2021   DVT (deep venous thrombosis) ([place]) 09/2013   Gastric ulcer    6-68month ago   GERD (gastroesophageal reflux disease)    takes Omeprazole daily   GI bleeding    Hemorrhoids    Hot flashes    takes ZOloft 3 times a week   Hypertension    Hypothyroidism    takes Synthroid daily as result of AMiodarone   Insomnia    Macular degeneration    Mild aortic insufficiency    a. Echo 10/2013: mild AI.   Moderate mitral regurgitation by prior echocardiogram    a. Echo 10/2013.   Moderate obstructive sleep apnea    uses CPAP;sleep study about 4-555monthago   MVP (mitral valve prolapse)    Osteoporosis    PAF (paroxysmal atrial fibrillation) (HCC)    Peripheral edema    left   Presbycusis    Pulmonary hypertension (HCC)    RLS  (restless legs syndrome)    Thyroid nodule    72m74mno change 11/11   Urethral stricture    Urinary anastomotic stricture    Urinary retention    Vitamin D deficiency     Past Surgical History:  Procedure Laterality Date   APPENDECTOMY     bilateral cataract surgery     CARDIAC CATHETERIZATION  08-06-13   CORONARY ARTERY BYPASS GRAFT N/A 08/28/2013   Procedure: CORONARY ARTERY BYPASS GRAFTING (CABG) x2 using right greater saphenous vein and left internal mammary artery. ;  Surgeon: EdwGrace IsaacD;  [facility];  Laterality: N/A;   HIP ARTHROPLASTY Right 12/09/2013   Procedure: ARTHROPLASTY BIPOLAR HIP CEMENTED;  Surgeon: FraKerin SalenD;  [facility];  Laterality: Right;   HIP PINNING,CANNULATED Left 03/30/2021   Procedure: CANNULATED HIP PINNING;  Surgeon: LanMarchia BondD;  [facility] [place] [facility];  Laterality: Left;   INTRAOPERATIVE TRANSESOPHAGEAL ECHOCARDIOGRAM N/A 08/28/2013   Procedure: INTRAOPERATIVE TRANSESOPHAGEAL ECHOCARDIOGRAM;  Surgeon: EdwGrace IsaacD;  [facility] [place];  Laterality: N/A;   LEFT AND RIGHT HEART CATHETERIZATION WITH CORONARY ANGIOGRAM N/A 08/16/2013   Procedure: LEFT AND RIGHT HEART CATHETERIZATION WITH CORONARY ANGIOGRAM;  Surgeon:  [name], MD;  Location[facility] C[facility] LAB;  Service: Cardiovascular;  Laterality: N/A;   MANDIBLE SURGERY     TCS     TONSILLECTOMY     trigger thumb     TUBAL LIGATION      Current Medications: No outpatient medications have been marked as taking for the 09/22/21 encounter (Appointment) with Belva Crome, MD.     Allergies:   Fentanyl; Amoxicillin; Atorvastatin; Bupropion; Codeine; Fosamax [alendronate sodium]; Gabapentin; Gluten meal; Hydrochlorothiazide; and Tetanus toxoid, adsorbed   Social History   Socioeconomic History   Marital status: Widowed    Spouse name: Not on file   Number of  children: Not on file   Years of education: Not on file   Highest education level: Not on file  Occupational History   Occupation: retired  Tobacco Use   Smoking status: Never   Smokeless tobacco: Never  Vaping Use   Vaping Use: Never used  Substance and Sexual Activity   Alcohol use: No   Drug use: No   Sexual activity: Yes    Birth control/protection: Surgical  Other Topics Concern   Not on file  Social History Narrative   Lives in retirement community   Right handed   Social Determinants of Health   Financial Resource Strain: Not on file  Food Insecurity: Not on file  Transportation Needs: Not on file  Physical Activity: Not on file  Stress: Not on file  Social Connections: Not on file     Family History: The patient's family history includes AAA (abdominal aortic aneurysm) in her brother; CAD in her father; CVA in her mother; Colon cancer in her father; Heart attack in her brother, father, and mother; Peripheral vascular disease in her brother.  ROS:   Please see the history of present illness.    *** All other systems reviewed and are negative.  EKGs/Labs/Other Studies Reviewed:    The following studies were reviewed today: ***  EKG:  EKG ***  Recent Labs: 04/03/2021: ALT 15; Hemoglobin 11.4; Platelets 301 04/14/2021: BUN 13; Creatinine, Ser 0.70; Potassium 4.2; Sodium 132  Recent Lipid Panel    Component Value Date/Time   CHOL 223 (H) 11/27/2018 0637   TRIG 93 11/27/2018 0637   HDL 74 11/27/2018 0637   CHOLHDL 3.0 11/27/2018 0637   VLDL 19 11/27/2018 0637   LDLCALC 130 (H) 11/27/2018 2951    Physical Exam:    VS:  There were no vitals taken for this visit.    Wt Readings from Last 3 Encounters:  06/26/21 100 lb 3.2 oz (45.5 kg)  04/14/21 91 lb 7.9 oz (41.5 kg)  03/30/21 102 lb (46.3 kg)     GEN: ***. No acute distress HEENT: Normal NECK: No JVD. LYMPHATICS: No lymphadenopathy CARDIAC: *** murmur. RRR *** gallop, or edema. VASCULAR: ***  Normal Pulses. No bruits. RESPIRATORY:  Clear to auscultation without rales, wheezing or rhonchi  ABDOMEN: Soft, non-tender, non-distended, No pulsatile mass, MUSCULOSKELETAL: No deformity  SKIN: Warm and dry NEUROLOGIC:  Alert and oriented x 3 PSYCHIATRIC:  Normal affect   ASSESSMENT:    1. Paroxysmal atrial fibrillation (HCC)   2. On amiodarone therapy   3. Chronic anticoagulation CHADS2VASC2 score 7   4. Coronary artery disease involving coronary bypass graft of native heart with angina pectoris ([place])   5. Nonrheumatic mitral valve regurgitation   6. Stage 3 chronic kidney disease, unspecified whether stage 3a or 3b CKD ([place])    PLAN:  In order of problems listed above:  ***   Medication Adjustments/Labs and Tests Ordered: Current medicines are reviewed at length with the patient today.  Concerns regarding medicines are outlined above.  No orders of the defined types were placed in this encounter.  No orders of the defined types were placed in this encounter.   There are no Patient Instructions on file for this visit.   Signed, [name], MD  09/20/2021 7:22 PM    [facility]

## 2021-09-22 ENCOUNTER — Ambulatory Visit: Payer: Medicare Other | Admitting: Interventional Cardiology

## 2021-09-28 ENCOUNTER — Other Ambulatory Visit: Payer: Self-pay | Admitting: Interventional Cardiology

## 2021-09-30 ENCOUNTER — Telehealth: Payer: Self-pay | Admitting: Interventional Cardiology

## 2021-09-30 NOTE — Telephone Encounter (Signed)
Called pt and left message to call back.  Need to get scheduled with DOD on Friday (Dr. Irish Lack).  No availability tomorrow and AF clinic is full as well.

## 2021-09-30 NOTE — Telephone Encounter (Signed)
Spoke with pt who states she has had increased episodes of Afib since Thanksgiving.  Pt reports she currently feels as if she is in Afib.  HR is stable in the 60's and BP 130's/60's.  Pt reports increased SOB and fatigue but does not sound SOB on the phone.  Denies current CP.  She states she is taking medications as prescribed.  Pt is past due for follow up with Dr [name].   Pt advised will forward information to Dr [name] and his RN for further review and recommendation.  Reviewed ED precautions.  Pt verbalizes understanding and agrees with current plan.

## 2021-09-30 NOTE — Telephone Encounter (Signed)
Patient c/o Palpitations:  High priority if patient c/o lightheadedness, shortness of breath, or chest pain  How long have you had palpitations/irregular HR/ Afib? Are you having the symptoms now? Pt isays she stays in Afib a lot  Are you currently experiencing lightheadedness, SOB or CP? Pressur in her chest sometimes, very short of breath   Do you have a history of afib (atrial fibrillation) or irregular heart rhythm?   Have you checked your BP or HR? (document readings if available):   Are you experiencing any other symptoms? Very fatigued, a little headache and a little lightheaded- she wants to be seen asap

## 2021-10-02 ENCOUNTER — Ambulatory Visit (INDEPENDENT_AMBULATORY_CARE_PROVIDER_SITE_OTHER): Payer: Medicare Other | Admitting: Interventional Cardiology

## 2021-10-02 ENCOUNTER — Encounter: Payer: Self-pay | Admitting: Interventional Cardiology

## 2021-10-02 ENCOUNTER — Other Ambulatory Visit: Payer: Self-pay

## 2021-10-02 ENCOUNTER — Ambulatory Visit (INDEPENDENT_AMBULATORY_CARE_PROVIDER_SITE_OTHER): Payer: Medicare Other

## 2021-10-02 VITALS — BP 140/62 | HR 62 | Ht 60.0 in | Wt 104.2 lb

## 2021-10-02 DIAGNOSIS — I25709 Atherosclerosis of coronary artery bypass graft(s), unspecified, with unspecified angina pectoris: Secondary | ICD-10-CM | POA: Diagnosis not present

## 2021-10-02 DIAGNOSIS — Z7901 Long term (current) use of anticoagulants: Secondary | ICD-10-CM

## 2021-10-02 DIAGNOSIS — R0602 Shortness of breath: Secondary | ICD-10-CM | POA: Diagnosis not present

## 2021-10-02 DIAGNOSIS — I480 Paroxysmal atrial fibrillation: Secondary | ICD-10-CM

## 2021-10-02 MED ORDER — AMIODARONE HCL 200 MG PO TABS: 100.0000 mg | ORAL_TABLET | Freq: Every day | ORAL | 0 refills | Status: DC

## 2021-10-02 NOTE — Patient Instructions (Addendum)
Medication Instructions:  Your physician recommends that you continue on your current medications as directed. Please refer to the Current Medication list given to you today.  *If you need a refill on your cardiac medications before your next appointment, please call your pharmacy*   Lab Work: Lab work to be done today--CBC, CMET, TSH, Iron panel, BNP If you have labs (blood work) drawn today and your tests are completely normal, you will receive your results only by: [facility] (if you have MyChart) OR A paper copy in the mail If you have any lab test that is abnormal or we need to change your treatment, we will call you to review the results.   Testing/Procedures: Your physician has recommended that you wear a holter monitor. Holter monitors are medical devices that record the heart's electrical activity. Doctors most often use these monitors to diagnose arrhythmias. Arrhythmias are problems with the speed or rhythm of the heartbeat. The monitor is a small, portable device. You can wear one while you do your normal daily activities. This is usually used to diagnose what is causing palpitations/syncope (passing out). 2 week zio   Follow-Up: At [facility], you and your health needs are our priority.  As part of our continuing mission to provide you with exceptional heart care, we have created designated Provider Care Teams.  These Care Teams include your primary Cardiologist (physician) and Advanced Practice Providers (APPs -  Physician Assistants and Nurse Practitioners) who all work together to provide you with the care you need, when you need it.  We recommend signing up for the patient portal called "MyChart".  Sign up information is provided on this After Visit Summary.  MyChart is used to connect with patients for Virtual Visits (Telemedicine).  Patients are able to view lab/test results, encounter notes, upcoming appointments, etc.  Non-urgent messages can be sent to your  provider as well.   To learn more about what you can do with MyChart, go to [email or web address].    Your next appointment:   4 week(s)  The format for your next appointment:   In Person  Provider:   [name], MD  or Robbie Lis, PA-C, Melina Copa, PA-C, Cecilie Kicks, NP, Ermalinda Barrios, PA-C, or Richardson Dopp, PA-C         Other Instructions  Bryn Gulling- Long Term Monitor Instructions  Your physician has requested you wear a ZIO patch monitor for 14 days.  This is a single patch monitor. [employer] supplies one patch monitor per enrollment. Additional stickers are not available. Please do not apply patch if you will be having a Nuclear Stress Test,  Echocardiogram, Cardiac CT, MRI, or Chest Xray during the period you would be wearing the  monitor. The patch cannot be worn during these tests. You cannot remove and re-apply the  ZIO XT patch monitor.  Your ZIO patch monitor will be mailed 3 day USPS to your address on file. It may take 3-5 days  to receive your monitor after you have been enrolled.  Once you have received your monitor, please review the enclosed instructions. Your monitor  has already been registered assigning a specific monitor serial # to you.  Billing and Patient Assistance Program Information  We have supplied [employer] with any of your insurance information on file for billing purposes. [employer] offers a sliding scale Patient Assistance Program for patients that do not have  insurance, or whose insurance does not completely cover the cost of the ZIO monitor.  You must apply for the Patient Assistance Program to qualify for this discounted rate.  To apply, please call [employer] at [phone], select option 4, select option 2, ask to apply for  Patient Assistance Program. Theodore Demark will ask your household income, and how many people  are in your household. They will quote your out-of-pocket cost based on that information.  [employer] will also be able to set up a  10-month interest-free payment plan if needed.  Applying the monitor   Shave hair from upper left chest.  Hold abrader disc by orange tab. Rub abrader in 40 strokes over the upper left chest as  indicated in your monitor instructions.  Clean area with 4 enclosed alcohol pads. Let dry.  Apply patch as indicated in monitor instructions. Patch will be placed under collarbone on left  side of chest with arrow pointing upward.  Rub patch adhesive wings for 2 minutes. Remove white label marked "1". Remove the white  label marked "2". Rub patch adhesive wings for 2 additional minutes.  While looking in a mirror, press and release button in center of patch. A small green light will  flash 3-4 times. This will be your only indicator that the monitor has been turned on.  Do not shower for the first 24 hours. You may shower after the first 24 hours.  Press the button if you feel a symptom. You will hear a small click. Record Date, Time and  Symptom in the Patient Logbook.  When you are ready to remove the patch, follow instructions on the last 2 pages of Patient  Logbook. Stick patch monitor onto the last page of Patient Logbook.  Place Patient Logbook in the blue and white box. Use locking tab on box and tape box closed  securely. The blue and white box has prepaid postage on it. Please place it in the mailbox as  soon as possible. Your physician should have your test results approximately 7 days after the  [facility]  Call [place]at [phone] you have questions regarding  your ZIO XT patch monitor. Call them immediately if you see an orange light blinking on your  monitor.  If your monitor falls off in less than 4 days, contact our Monitor department at [phone]  If your monitor becomes loose or falls off after 4 days call [employer] at [phone]  suggestions on securing your monitor

## 2021-10-02 NOTE — Progress Notes (Signed)
Cardiology Office Note   Date:  10/02/2021   ID:  Maria [name], DOB 13-Aug-1924, MRN [id number]  PCP:  [name], MD    No chief complaint on file.  AFib  Wt Readings from Last 3 Encounters:  10/02/21 104 lb 3.2 oz (47.3 kg)  06/26/21 100 lb 3.2 oz (45.5 kg)  04/14/21 91 lb 7.9 oz (41.5 kg)       History of Present Illness: Maria [name] is a 87 y.o. female  with a hx of paroxysmal atrial fibrillation, amiodarone therapy, bilateral carotid stenosis, coronary artery disease with multivessel coronary bypass grafting 2014, chronic kidney disease, chronic anticoagulation with Xarelto, Celiac disease, and hypertension.  Recent TIA leading to switch from Xarelto to apixaban 2.5 mg twice daily.  SHe has had some shortness of breath.  SHe has noted feet swelling and face swelling.  Having to prop up at night and this is getting worse.   She has been taking Lasix but it is not giving a dramatic effect.   Has had some unsteadiness.   Denies : Chest pain. Dizziness. Nitroglycerin use. Syncope.    Her weight has increased.  She feels that she has been in atrial fibrillation for at least couple of weeks.  Prior monitor in 11/2018 showed that she was in atrial fibrillation about 45% of the time.  She is adamant about not undergoing cardioversion.  She states she has several physician friends who have had cardioversion who now advised against it.      Past Medical History:  Diagnosis Date   Anemia    Atonic bladder 12/11/2013   Carotid artery occlusion    right carotid stenosis 40-60%, 4/08, neg for stenosis repeat study 11/11   Celiac disease    Chronic anticoagulation CHADS2VASC2 score 7 10/18/2014   Chronic diastolic CHF (congestive heart failure) (HCC)    takes Lasix daily   CKD (chronic kidney disease), stage III ([place])    Coronary artery disease    a.  08/2013 (LIMA-LAD, rSVG to LCX and Lt atrial clip),   Depression    DNR (do not resuscitate) 03/28/2021   DVT  (deep venous thrombosis) ([place]) 09/2013   Gastric ulcer    6-36month ago   GERD (gastroesophageal reflux disease)    takes Omeprazole daily   GI bleeding    Hemorrhoids    Hot flashes    takes ZOloft 3 times a week   Hypertension    Hypothyroidism    takes Synthroid daily as result of AMiodarone   Insomnia    Macular degeneration    Mild aortic insufficiency    a. Echo 10/2013: mild AI.   Moderate mitral regurgitation by prior echocardiogram    a. Echo 10/2013.   Moderate obstructive sleep apnea    uses CPAP;sleep study about 4-5652monthago   MVP (mitral valve prolapse)    Osteoporosis    PAF (paroxysmal atrial fibrillation) (HCC)    Peripheral edema    left   Presbycusis    Pulmonary hypertension (HCC)    RLS (restless legs syndrome)    Thyroid nodule    52m1mno change 11/11   Urethral stricture    Urinary anastomotic stricture    Urinary retention    Vitamin D deficiency     Past Surgical History:  Procedure Laterality Date   APPENDECTOMY     bilateral cataract surgery     CARDIAC CATHETERIZATION  08-06-13   CORONARY ARTERY BYPASS GRAFT N/A 08/28/2013  Procedure: CORONARY ARTERY BYPASS GRAFTING (CABG) x2 using right greater saphenous vein and left internal mammary artery. ;  Surgeon: Grace Isaac, MD;  Location: [place];  Service: Open Heart Surgery;  Laterality: N/A;   HIP ARTHROPLASTY Right 12/09/2013   Procedure: ARTHROPLASTY BIPOLAR HIP CEMENTED;  Surgeon: Kerin Salen, MD;  Location: [facility];  Service: Orthopedics;  Laterality: Right;   HIP PINNING,CANNULATED Left 03/30/2021   Procedure: CANNULATED HIP PINNING;  Surgeon: Marchia Bond, MD;  Location: [facility];  Service: Orthopedics;  Laterality: Left;   INTRAOPERATIVE TRANSESOPHAGEAL ECHOCARDIOGRAM N/A 08/28/2013   Procedure: INTRAOPERATIVE TRANSESOPHAGEAL ECHOCARDIOGRAM;  Surgeon: Grace Isaac, MD;  Location: [facility];  Service: Open Heart Surgery;  Laterality: N/A;   LEFT AND RIGHT HEART CATHETERIZATION WITH  CORONARY ANGIOGRAM N/A 08/16/2013   Procedure: LEFT AND RIGHT HEART CATHETERIZATION WITH CORONARY ANGIOGRAM;  Surgeon: [name], MD;  Location: [facility] CATH LAB;  Service: Cardiovascular;  Laterality: N/A;   MANDIBLE SURGERY     TCS     TONSILLECTOMY     trigger thumb     TUBAL LIGATION       Current Outpatient Medications  Medication Sig Dispense Refill   acetaminophen (TYLENOL) 500 MG tablet Take 1,000 mg by mouth every 6 (six) hours as needed (pain).     amiodarone (PACERONE) 200 MG tablet Take 0.5 tablets (100 mg total) by mouth daily. Please make overdue appt with Dr. [name] before anymore refills. Thank you 3rd and Final Attempt 7 tablet 0   amLODipine (NORVASC) 2.5 MG tablet Take 1 tablet (2.5 mg total) by mouth daily for 30 days. 30 tablet 0   apixaban (ELIQUIS) 2.5 MG TABS tablet Take 1 tablet (2.5 mg total) by mouth 2 (two) times daily for 30 days. 60 tablet 0   furosemide (LASIX) 20 MG tablet Take 1 tablet (20 mg total) by mouth every morning.     levothyroxine (SYNTHROID) 50 MCG tablet Take 0.5 tablets (25 mcg total) by mouth daily before breakfast.     magnesium hydroxide (MILK OF MAGNESIA) 400 MG/5ML suspension Take 15 mLs by mouth at bedtime as needed for mild constipation.     melatonin 5 MG TABS Take 1-2 tablets (5-10 mg total) by mouth at bedtime.  0   Menthol, Topical Analgesic, (ICY HOT EX) Apply 1 application topically daily as needed (pain).     Multiple Vitamin (MULTIVITAMIN WITH MINERALS) TABS tablet Take 1 tablet by mouth daily.     Nutritional Supplements (BOOST PO) Take by mouth.     Polyethyl Glycol-Propyl Glycol (SYSTANE) 0.4-0.3 % SOLN Place 1 drop into both eyes at bedtime.     polyethylene glycol (MIRALAX / GLYCOLAX) 17 g packet Take 17 g by mouth daily as needed for moderate constipation or mild constipation.     senna-docusate (SENOKOT-S) 8.6-50 MG tablet Take 1 tablet by mouth daily.     sertraline (ZOLOFT) 50 MG tablet Take 50 mg by mouth every  morning.     cholecalciferol (VITAMIN D3) 10 MCG (400 UNIT) TABS tablet Take 1 tablet (400 Units total) by mouth daily. (Patient not taking: Reported on 10/02/2021) 30 tablet 0   famotidine (PEPCID) 10 MG tablet Take 1 tablet (10 mg total) by mouth 2 (two) times daily. (Patient not taking: Reported on 10/02/2021)     feeding supplement (ENSURE ENLIVE / ENSURE PLUS) LIQD Take 237 mLs by mouth 2 (two) times daily between meals. (Patient not taking: Reported on 10/02/2021)  ferrous sulfate 325 (65 FE) MG tablet Take 1 tablet (325 mg total) by mouth daily with breakfast. (Patient not taking: Reported on 10/02/2021)  3   traMADol (ULTRAM) 50 MG tablet Take 2 tablets (100 mg total) by mouth 3 (three) times daily. (Patient not taking: Reported on 06/26/2021) 20 tablet 0   No current facility-administered medications for this visit.    Allergies:   Fentanyl; Amoxicillin; Atorvastatin; Bupropion; Codeine; Fosamax [alendronate sodium]; Gabapentin; Gluten meal; Hydrochlorothiazide; and Tetanus toxoid, adsorbed    Social History:  The patient  reports that she has never smoked. She has never used smokeless tobacco. She reports that she does not drink alcohol and does not use drugs.   Family History:  The patient's family history includes AAA (abdominal aortic aneurysm) in her brother; CAD in her father; CVA in her mother; Colon cancer in her father; Heart attack in her brother, father, and mother; Peripheral vascular disease in her brother.    ROS:  Please see the history of present illness.   Otherwise, review of systems are positive for shortness of breath.   All other systems are reviewed and negative.    PHYSICAL EXAM: VS:  BP 140/62   Pulse 62   Ht 5' (1.524 m)   Wt 104 lb 3.2 oz (47.3 kg)   SpO2 97%   BMI 20.35 kg/m  , BMI Body mass index is 20.35 kg/m. GEN: Well nourished, well developed, in no acute distress HEENT: normal Neck: mild JVD, up to mid neck, no carotid bruits, or masses Cardiac:  irregularly irregular; no murmurs, rubs, or gallops,; slight  edema at top of her feet Respiratory:  clear to auscultation bilaterally, normal work of breathing GI: soft, nontender, nondistended, + BS MS: no deformity or atrophy Skin: warm and dry, no rash Neuro:  Strength and sensation are intact Psych: euthymic mood, full affect   EKG:   The ekg ordered today demonstrates atrial fibrillation, right bundle branch block, rate controlled   Recent Labs: 04/03/2021: ALT 15; Hemoglobin 11.4; Platelets 301 04/14/2021: BUN 13; Creatinine, Ser 0.70; Potassium 4.2; Sodium 132   Lipid Panel    Component Value Date/Time   CHOL 223 (H) 11/27/2018 0637   TRIG 93 11/27/2018 0637   HDL 74 11/27/2018 0637   CHOLHDL 3.0 11/27/2018 0637   VLDL 19 11/27/2018 0637   LDLCALC 130 (H) 11/27/2018 2671     Other studies Reviewed: Additional studies/ records that were reviewed today with results demonstrating: Hemoglobin 11.4 in June 2022, creatinine 0.7, potassium 4.2.  TSH 5.6 in February 2022   ASSESSMENT AND PLAN:  AFib: Feeling some palpitations.  Feels like her heart races 1-2 x/week.  Continue low-dose amiodarone for now.  Plan for 2 weeks Zio patch.  Check TSH.  She describes symptoms of heart failure, likely acute diastolic heart failure given prior EF is normal. Check BMet and BNP. CAD: No angina.  Continue aggressive secondary prevention.  HTN: Has had some high readings at home.  Can come down to the 130-140.  HR up to 90 at home.  Carotid artery disease: Mild carotid disease noted at the time of her bypass.  Anticoagulated: No bleeding issues.  On Eliquis.  Check CBC and iron as she has had iron deficiency anemia issues in the past.  This could contribute to fatigue.   Current medicines are reviewed at length with the patient today.  The patient concerns regarding her medicines were addressed.  The following changes have been made:  No  change  Labs/ tests ordered today include:  No  orders of the defined types were placed in this encounter.   Recommend 150 minutes/week of aerobic exercise Low fat, low carb, high fiber diet recommended  Disposition:   FU with Dr. [name] in 4 weeks   Signed, Larae Grooms, MD  10/02/2021 2:49 PM    [address], [place], [place]  [address] Phone: [phone]; Fax: [phone]

## 2021-10-02 NOTE — Progress Notes (Unsigned)
Enrolled for [employer] to mail a ZIO XT long term holter monitor to the patients address on file.   Dr. [name] to read.

## 2021-10-03 LAB — IRON,TIBC AND FERRITIN PANEL
Ferritin: 24 ng/mL (ref 15–150)
Iron Saturation: 3 % — CL (ref 15–55)
Iron: 16 ug/dL — ABNORMAL LOW (ref 27–139)
Total Iron Binding Capacity: 458 ug/dL — ABNORMAL HIGH (ref 250–450)
UIBC: 442 ug/dL — ABNORMAL HIGH (ref 118–369)

## 2021-10-03 LAB — COMPREHENSIVE METABOLIC PANEL WITH GFR
ALT: 45 [IU]/L — ABNORMAL HIGH (ref 0–32)
AST: 48 [IU]/L — ABNORMAL HIGH (ref 0–40)
Albumin/Globulin Ratio: 1.3 (ref 1.2–2.2)
Albumin: 3.9 g/dL (ref 3.5–4.6)
Alkaline Phosphatase: 125 [IU]/L — ABNORMAL HIGH (ref 44–121)
BUN/Creatinine Ratio: 18 (ref 12–28)
BUN: 14 mg/dL (ref 10–36)
Bilirubin Total: 0.4 mg/dL (ref 0.0–1.2)
CO2: 22 mmol/L (ref 20–29)
Calcium: 9 mg/dL (ref 8.7–10.3)
Chloride: 99 mmol/L (ref 96–106)
Creatinine, Ser: 0.79 mg/dL (ref 0.57–1.00)
Globulin, Total: 3 g/dL (ref 1.5–4.5)
Glucose: 78 mg/dL (ref 70–99)
Potassium: 4.4 mmol/L (ref 3.5–5.2)
Sodium: 133 mmol/L — ABNORMAL LOW (ref 134–144)
Total Protein: 6.9 g/dL (ref 6.0–8.5)
eGFR: 69 mL/min/{1.73_m2}

## 2021-10-03 LAB — CBC
Hematocrit: 32.2 % — ABNORMAL LOW (ref 34.0–46.6)
Hemoglobin: 10.4 g/dL — ABNORMAL LOW (ref 11.1–15.9)
MCH: 26.3 pg — ABNORMAL LOW (ref 26.6–33.0)
MCHC: 32.3 g/dL (ref 31.5–35.7)
MCV: 82 fL (ref 79–97)
Platelets: 150 10*3/uL (ref 150–450)
RBC: 3.95 x10E6/uL (ref 3.77–5.28)
RDW: 14.3 % (ref 11.7–15.4)
WBC: 5.1 10*3/uL (ref 3.4–10.8)

## 2021-10-03 LAB — TSH: TSH: 6.13 u[IU]/mL — ABNORMAL HIGH (ref 0.450–4.500)

## 2021-10-03 LAB — PRO B NATRIURETIC PEPTIDE: NT-Pro BNP: 1013 pg/mL — ABNORMAL HIGH (ref 0–738)

## 2021-10-05 ENCOUNTER — Telehealth: Payer: Self-pay | Admitting: *Deleted

## 2021-10-05 MED ORDER — FUROSEMIDE 40 MG PO TABS: 40.0000 mg | ORAL_TABLET | Freq: Every day | ORAL | 1 refills | Status: DC

## 2021-10-05 NOTE — Telephone Encounter (Signed)
I spoke with patient and reviewed results/recommendations with her.  Furosemide prescription sent to [facility]. Labs routed to Dr Carlyle Lipa office.

## 2021-10-05 NOTE — Telephone Encounter (Signed)
-----   Message from Jettie Booze, MD sent at 10/05/2021  1:02 PM EST ----- Hbg has dropped slightly.  Iron level very low. TSH increased indicating that Synthroid may need to be increased. Fluid level increased.  Increase furosemide to 40 mg BID x 2 days and then 40 mg daily. Please cc Dr.Stoneking.  May need to consider IV iron infusions.

## 2021-10-10 DIAGNOSIS — I480 Paroxysmal atrial fibrillation: Secondary | ICD-10-CM | POA: Diagnosis not present

## 2021-10-13 DIAGNOSIS — D509 Iron deficiency anemia, unspecified: Secondary | ICD-10-CM | POA: Diagnosis not present

## 2021-10-13 DIAGNOSIS — K9049 Malabsorption due to intolerance, not elsewhere classified: Secondary | ICD-10-CM | POA: Diagnosis not present

## 2021-10-16 ENCOUNTER — Telehealth: Payer: Self-pay | Admitting: Interventional Cardiology

## 2021-10-16 NOTE — Telephone Encounter (Signed)
Lets go ahead and bring her Lasix back to 40 mg twice a day for the next 7 days.  Reduce back to 40 mg after that.  Please call us with results.

## 2021-10-16 NOTE — Telephone Encounter (Signed)
Spoke with the patient and advised her to increase  Lasix to 40 mg twice daily for the next week. She will let us know if it helps.

## 2021-10-16 NOTE — Telephone Encounter (Signed)
Spoke with the patient who reports that she is still having swelling in her feet despite increasing her Lasix. She still complains of shortness of breath with exertion. She states that she has not lost any weight. She is having to sit up at night in order to breath better. She has not been elevating her legs which I have advised her to do. She increased lasix to 40 mg BID for a couple days and is now back to 40 mg once per day. She does not recall if increasing the dose to twice per day helped or not. She also reports that her heart is pulsing on the right side of her neck. She states that she could see it beating when she looked in the mirror.  She states that her heart rate is not irregular. Current blood pressure is 123/73 and heart rate 60. She denies any dizziness or lightheadedness.  Will route to DOD for further advisement.

## 2021-10-16 NOTE — Telephone Encounter (Signed)
Patient would like to speak to nurse.  She states she can see her pulse beating in her neck, she never noticed that before.  She wants to make sure that's okay.

## 2021-10-29 DIAGNOSIS — D509 Iron deficiency anemia, unspecified: Secondary | ICD-10-CM | POA: Diagnosis not present

## 2021-10-29 DIAGNOSIS — K9049 Malabsorption due to intolerance, not elsewhere classified: Secondary | ICD-10-CM | POA: Diagnosis not present

## 2021-10-30 ENCOUNTER — Telehealth: Payer: Self-pay | Admitting: Interventional Cardiology

## 2021-10-30 NOTE — Telephone Encounter (Signed)
Pt states she has noticed her pulse throbbing in her neck.  Said PT at [facility] noticed it as well.  Denies pain.  Still having swelling.  States vitals were fine the last time she checked them but didn't have them nearby to give to me.  She continues on Furosemide 60m BID.  Did not go to QD as previously instructed.  Explained to pt this is not unusual to see in AF pt's sometimes, or in someone who is volume overloaded.  Moved her appt up to Tuesday with Dr. STamala Julian  Spoke with Dr. STamala Julianand he advised pt remain on Furosemide 41mBID and plan to see [place]oreaext week.  Made pt aware and she was very thankful for the call.

## 2021-10-30 NOTE — Telephone Encounter (Signed)
Pt took her heart monitor of on Sunday, pt have been having pulsing in her carotid artery and her neck and she wants to know if she is at risk for a stroke

## 2021-11-01 NOTE — Progress Notes (Addendum)
Cardiology Office Note:    Date:  11/14/2021   ID:  [name], DOB [birth year], MRN [id number]  PCP:  [name], MD  Cardiologist:  [name], MD   Referring MD: [name], MD   Chief Complaint  Patient presents with   Cardiac Valve Problem   Atrial Fibrillation    History of Present Illness:    Maria [name] is a 88 y.o. female with a hx of  paroxysmal atrial fibrillation, amiodarone therapy, bilateral carotid stenosis, coronary artery disease with multivessel coronary bypass grafting 2014, chronic kidney disease, chronic anticoagulation with Xarelto, Celiac disease, and hypertension.  Recent TIA leading to switch from Xarelto to apixaban 2.5 mg twice daily.    Feels tired all the time.  Little activity causes shortness of breath.  She denies orthopnea.  She is concerned about losing her mental capacities.  She also has had some swelling and does not feel that the increased furosemide dose is helping her to feel better.  Laboratory data before starting 40 mg twice daily of furosemide revealed normal function.  Since starting that she has lost 3 pounds over 5 weeks.  She still complains of dyspnea and lower extremity swelling.  Past Medical History:  Diagnosis Date   Anemia    Atonic bladder 12/11/2013   Carotid artery occlusion    right carotid stenosis 40-60%, 4/08, neg for stenosis repeat study 11/11   Celiac disease    Chronic anticoagulation CHADS2VASC2 score 7 10/18/2014   Chronic diastolic CHF (congestive heart failure) (HCC)    takes Lasix daily   CKD (chronic kidney disease), stage III ([place])    Coronary artery disease    a.  08/2013 (LIMA-LAD, rSVG to LCX and Lt atrial clip),   Depression    DNR (do not resuscitate) 03/28/2021   DVT (deep venous thrombosis) ([place]) 09/2013   Gastric ulcer    6-42month ago   GERD (gastroesophageal reflux disease)    takes Omeprazole daily   GI bleeding    Hemorrhoids    Hot flashes    takes ZOloft 3 times  a week   Hypertension    Hypothyroidism    takes Synthroid daily as result of AMiodarone   Insomnia    Macular degeneration    Mild aortic insufficiency    a. Echo 10/2013: mild AI.   Moderate mitral regurgitation by prior echocardiogram    a. Echo 10/2013.   Moderate obstructive sleep apnea    uses CPAP;sleep study about 4-551monthago   MVP (mitral valve prolapse)    Osteoporosis    PAF (paroxysmal atrial fibrillation) (HCC)    Peripheral edema    left   Presbycusis    Pulmonary hypertension (HCC)    RLS (restless legs syndrome)    Thyroid nodule    54m61mno change 11/11   Urethral stricture    Urinary anastomotic stricture    Urinary retention    Vitamin D deficiency     Past Surgical History:  Procedure Laterality Date   APPENDECTOMY     bilateral cataract surgery     CARDIAC CATHETERIZATION  08-06-13   CORONARY ARTERY BYPASS GRAFT N/A 08/28/2013   Procedure: CORONARY ARTERY BYPASS GRAFTING (CABG) x2 using right greater saphenous vein and left internal mammary artery. ;  Surgeon: EdwGrace IsaacD;  [facility];  Laterality: N/A;   HIP ARTHROPLASTY Right 12/09/2013   Procedure: ARTHROPLASTY BIPOLAR HIP CEMENTED;  Surgeon: FraKathalene Frames  Mayer Camel, MD;  Location: [place];  Service: Orthopedics;  Laterality: Right;   HIP PINNING,CANNULATED Left 03/30/2021   Procedure: CANNULATED HIP PINNING;  Surgeon: Marchia Bond, MD;  Location: [place];  Service: Orthopedics;  Laterality: Left;   INTRAOPERATIVE TRANSESOPHAGEAL ECHOCARDIOGRAM N/A 08/28/2013   Procedure: INTRAOPERATIVE TRANSESOPHAGEAL ECHOCARDIOGRAM;  Surgeon: Grace Isaac, MD;  Location: [facility];  Service: Open Heart Surgery;  Laterality: N/A;   LEFT AND RIGHT HEART CATHETERIZATION WITH CORONARY ANGIOGRAM N/A 08/16/2013   Procedure: LEFT AND RIGHT HEART CATHETERIZATION WITH CORONARY ANGIOGRAM;  Surgeon: [name], MD;  Location: [facility] CATH LAB;  Service: Cardiovascular;  Laterality: N/A;    MANDIBLE SURGERY     TCS     TONSILLECTOMY     trigger thumb     TUBAL LIGATION      Current Medications: Current Meds  Medication Sig   acetaminophen (TYLENOL) 500 MG tablet Take 1,000 mg by mouth every 6 (six) hours as needed (pain).   amiodarone (PACERONE) 200 MG tablet Take 0.5 tablets (100 mg total) by mouth daily.   amLODipine (NORVASC) 2.5 MG tablet Take 1 tablet (2.5 mg total) by mouth daily for 30 days.   apixaban (ELIQUIS) 2.5 MG TABS tablet Take 1 tablet (2.5 mg total) by mouth 2 (two) times daily for 30 days.   cholecalciferol (VITAMIN D3) 10 MCG (400 UNIT) TABS tablet Take 1 tablet (400 Units total) by mouth daily.   feeding supplement (ENSURE ENLIVE / ENSURE PLUS) LIQD Take 237 mLs by mouth 2 (two) times daily between meals.   furosemide (LASIX) 40 MG tablet Take 80 mg by mouth every other day, alternate with 40 mg by mouth every other day.   levothyroxine (SYNTHROID) 50 MCG tablet Take 0.5 tablets (25 mcg total) by mouth daily before breakfast.   magnesium hydroxide (MILK OF MAGNESIA) 400 MG/5ML suspension Take 15 mLs by mouth at bedtime as needed for mild constipation.   melatonin 5 MG TABS Take 1-2 tablets (5-10 mg total) by mouth at bedtime.   Menthol, Topical Analgesic, (ICY HOT EX) Apply 1 application topically daily as needed (pain).   Multiple Vitamin (MULTIVITAMIN WITH MINERALS) TABS tablet Take 1 tablet by mouth daily.   Nutritional Supplements (BOOST PO) Take by mouth.   Polyethyl Glycol-Propyl Glycol (SYSTANE) 0.4-0.3 % SOLN Place 1 drop into both eyes at bedtime.   sertraline (ZOLOFT) 50 MG tablet Take 50 mg by mouth every morning.   [DISCONTINUED] furosemide (LASIX) 40 MG tablet Take 40 mg by mouth 2 (two) times daily.     Allergies:   Fentanyl; Amoxicillin; Atorvastatin; Bupropion; Codeine; Fosamax [alendronate sodium]; Gabapentin; Gluten meal; Hydrochlorothiazide; and Tetanus toxoid, adsorbed   Social History   Socioeconomic History   Marital status:  Widowed    Spouse name: Not on file   Number of children: Not on file   Years of education: Not on file   Highest education level: Not on file  Occupational History   Occupation: retired  Tobacco Use   Smoking status: Never   Smokeless tobacco: Never  Vaping Use   Vaping Use: Never used  Substance and Sexual Activity   Alcohol use: No   Drug use: No   Sexual activity: Yes    Birth control/protection: Surgical  Other Topics Concern   Not on file  Social History Narrative   Lives in retirement community   Right handed   Social Determinants of Health   Financial Resource Strain: Not on file  Food Insecurity:  Not on file  Transportation Needs: Not on file  Physical Activity: Not on file  Stress: Not on file  Social Connections: Not on file     Family History: The patient's family history includes AAA (abdominal aortic aneurysm) in her brother; CAD in her father; CVA in her mother; Colon cancer in her father; Heart attack in her brother, father, and mother; Peripheral vascular disease in her brother.  ROS:   Please see the history of present illness.    Concerned about her longevity.  Does not want invasive procedures to manage atrial fibrillation.  Does not want electrical cardioversion [employer]) all other systems reviewed and are negative.  EKGs/Labs/Other Studies Reviewed:    The following studies were reviewed today: Continuous monitor December 2022: Narrative & Impression   Continuous atrial fibrillation, average heart rate 70 bpm While awake, not uncommon to have heart rates as high as 130 bpm.   Overall poor rate control   2D Doppler ECHOCARDIOGRAM 2020: IMPRESSIONS     1. The left ventricle has normal systolic function of 72-62%. The cavity  size is normal. There is normal left ventricular wall thickness. Echo  evidence of normal diastolic filling patterns.   2. Moderately dilated left atrial size.   3. Mildly dilated right atrial size.   4.  Mild mitral insufficiency.   5. Tricuspid regurgitation is mild.   6. Sclerosis without any evidence of stenosis. Aortic valve regurgitation  is mild by color flow Doppler.   7. No atrial level shunt detected by color flow Doppler.   EKG:  EKG not performed today.  Tracing from last week reveals atrial fibs with controlled rate.  Recent Labs: 10/02/2021: ALT 45; Hemoglobin 10.4; NT-Pro BNP 1,013; Platelets 150; TSH 6.130 11/11/2021: BUN 11; Creatinine, Ser 0.76; Potassium 4.3; Sodium 138  Recent Lipid Panel    Component Value Date/Time   CHOL 223 (H) 11/27/2018 0637   TRIG 93 11/27/2018 0637   HDL 74 11/27/2018 0637   CHOLHDL 3.0 11/27/2018 0637   VLDL 19 11/27/2018 0637   LDLCALC 130 (H) 11/27/2018 0637    Physical Exam:    VS:  BP 130/70    Pulse 81    Ht 5' (1.524 m)    Wt 101 lb (45.8 kg)    SpO2 99%    BMI 19.73 kg/m     Wt Readings from Last 3 Encounters:  11/03/21 101 lb (45.8 kg)  10/02/21 104 lb 3.2 oz (47.3 kg)  06/26/21 100 lb 3.2 oz (45.5 kg)     GEN: Elderly frail and with measured mobility.. No acute distress HEENT: Normal NECK: No JVD. LYMPHATICS: No lymphadenopathy CARDIAC: 2-3 over 6 apical to left axillary systolic murmur. IIRR no gallop, or edema. VASCULAR:  Normal Pulses. No bruits. RESPIRATORY:  Clear to auscultation without rales, wheezing or rhonchi  ABDOMEN: Soft, non-tender, non-distended, No pulsatile mass, MUSCULOSKELETAL: No deformity  SKIN: Warm and dry NEUROLOGIC:  Alert and oriented x 3 PSYCHIATRIC:  Normal affect   ASSESSMENT:    1. Paroxysmal atrial fibrillation (HCC)   2. Chronic anticoagulation   3. Coronary artery disease involving coronary bypass graft of native heart with angina pectoris ([place])   4. On amiodarone therapy   5. Nonrheumatic mitral valve regurgitation   6. Stage 3 chronic kidney disease, unspecified whether stage 3a or 3b CKD ([place])   7. Acute on chronic combined systolic and diastolic CHF (congestive heart failure)  ([place])    PLAN:    In order  of problems listed above:  Poor rate control with heart rates in the 120s with ambulation.  Increase amiodarone to 400 mg/day for 1 week then decrease to 200 mg/day.  Follow-up in 1 month with EKG.   Continue anticoagulation.  Ferritin level is very low.  She is having occult blood loss.  She is being seen by GI. She denies angina. Amiodarone dose will be increased from 100 mg/day to 200 mg/day after a 1 week 400 mg/day day up titration. Hoping for spontaneous conversion. Systolic murmur is concerning.  Basic metabolic panel in 1 week to avoid unrecognized dehydration. Furosemide increased to 80 mg/day for 1 week and then alternating 40 with 80 mg/day thereafter.  Bmet in 1 week.  Call if increasing weight loss, lightheadedness, or low blood pressure.    Medication Adjustments/Labs and Tests Ordered: Current medicines are reviewed at length with the patient today.  Concerns regarding medicines are outlined above.  Orders Placed This Encounter  Procedures   Basic Metabolic Panel (BMET)   Meds ordered this encounter  Medications   furosemide (LASIX) 40 MG tablet    Sig: Take 80 mg by mouth every other day, alternate with 40 mg by mouth every other day.    Dispense:  135 tablet    Refill:  3    Patient Instructions  Medication Instructions:  INCREASE amiodarone to 400 mg by mouth daily for 7 days, then 200 mg by mouth daily there after (regular dose). Take FUROSEMIDE 80 mg by mouth daily for one week. Then alternate furosemide 80 mg and 40 mg by mouth daily.   *If you need a refill on your cardiac medications before your next appointment, please call your pharmacy*   Lab Work: BMET in one week  If you have labs (blood work) drawn today and your tests are completely normal, you will receive your results only by: [place] (if you have MyChart) OR A paper copy in the mail If you have any lab test that is abnormal or we need to change your  treatment, we will call you to review the results.   Testing/Procedures: EKG on return visit.   Follow-Up: At [facility], you and your health needs are our priority.  As part of our continuing mission to provide you with exceptional heart care, we have created designated Provider Care Teams.  These Care Teams include your primary Cardiologist (physician) and Advanced Practice Providers (APPs -  Physician Assistants and Nurse Practitioners) who all work together to provide you with the care you need, when you need it.  We recommend signing up for the patient portal called "MyChart".  Sign up information is provided on this After Visit Summary.  MyChart is used to connect with patients for Virtual Visits (Telemedicine).  Patients are able to view lab/test results, encounter notes, upcoming appointments, etc.  Non-urgent messages can be sent to your provider as well.   To learn more about what you can do with MyChart, go to [email or web address].    Your next appointment:   February 20 at 1:40  Provider:   [name], MD       Signed, [name], MD  11/14/2021 10:50 AM    [place]

## 2021-11-02 DIAGNOSIS — I480 Paroxysmal atrial fibrillation: Secondary | ICD-10-CM | POA: Diagnosis not present

## 2021-11-03 ENCOUNTER — Encounter: Payer: Self-pay | Admitting: Interventional Cardiology

## 2021-11-03 ENCOUNTER — Other Ambulatory Visit: Payer: Self-pay

## 2021-11-03 ENCOUNTER — Ambulatory Visit (INDEPENDENT_AMBULATORY_CARE_PROVIDER_SITE_OTHER): Payer: Medicare Other | Admitting: Interventional Cardiology

## 2021-11-03 VITALS — BP 130/70 | HR 81 | Ht 60.0 in | Wt 101.0 lb

## 2021-11-03 DIAGNOSIS — I480 Paroxysmal atrial fibrillation: Secondary | ICD-10-CM

## 2021-11-03 DIAGNOSIS — N183 Chronic kidney disease, stage 3 unspecified: Secondary | ICD-10-CM | POA: Diagnosis not present

## 2021-11-03 DIAGNOSIS — I5043 Acute on chronic combined systolic (congestive) and diastolic (congestive) heart failure: Secondary | ICD-10-CM

## 2021-11-03 DIAGNOSIS — I340 Nonrheumatic mitral (valve) insufficiency: Secondary | ICD-10-CM | POA: Diagnosis not present

## 2021-11-03 DIAGNOSIS — Z7901 Long term (current) use of anticoagulants: Secondary | ICD-10-CM | POA: Diagnosis not present

## 2021-11-03 DIAGNOSIS — I25709 Atherosclerosis of coronary artery bypass graft(s), unspecified, with unspecified angina pectoris: Secondary | ICD-10-CM | POA: Diagnosis not present

## 2021-11-03 DIAGNOSIS — Z79899 Other long term (current) drug therapy: Secondary | ICD-10-CM

## 2021-11-03 MED ORDER — FUROSEMIDE 40 MG PO TABS: ORAL_TABLET | ORAL | 3 refills | Status: DC

## 2021-11-03 NOTE — Patient Instructions (Addendum)
Medication Instructions:  INCREASE amiodarone to 400 mg by mouth daily for 7 days, then 200 mg by mouth daily there after (regular dose). Take FUROSEMIDE 80 mg by mouth daily for one week. Then alternate furosemide 80 mg and 40 mg by mouth daily.   *If you need a refill on your cardiac medications before your next appointment, please call your pharmacy*   Lab Work: BMET in one week  If you have labs (blood work) drawn today and your tests are completely normal, you will receive your results only by: [place] (if you have MyChart) OR A paper copy in the mail If you have any lab test that is abnormal or we need to change your treatment, we will call you to review the results.   Testing/Procedures: EKG on return visit.   Follow-Up: At [facility], you and your health needs are our priority.  As part of our continuing mission to provide you with exceptional heart care, we have created designated Provider Care Teams.  These Care Teams include your primary Cardiologist (physician) and Advanced Practice Providers (APPs -  Physician Assistants and Nurse Practitioners) who all work together to provide you with the care you need, when you need it.  We recommend signing up for the patient portal called "MyChart".  Sign up information is provided on this After Visit Summary.  MyChart is used to connect with patients for Virtual Visits (Telemedicine).  Patients are able to view lab/test results, encounter notes, upcoming appointments, etc.  Non-urgent messages can be sent to your provider as well.   To learn more about what you can do with MyChart, go to [email or web address].    Your next appointment:   February 20 at 1:40  Provider:   [name], MD

## 2021-11-06 ENCOUNTER — Ambulatory Visit: Payer: Medicare Other | Admitting: Interventional Cardiology

## 2021-11-11 ENCOUNTER — Other Ambulatory Visit: Payer: Self-pay

## 2021-11-11 ENCOUNTER — Other Ambulatory Visit: Payer: Medicare Other | Admitting: *Deleted

## 2021-11-11 DIAGNOSIS — I480 Paroxysmal atrial fibrillation: Secondary | ICD-10-CM

## 2021-11-11 DIAGNOSIS — N183 Chronic kidney disease, stage 3 unspecified: Secondary | ICD-10-CM

## 2021-11-11 DIAGNOSIS — I25709 Atherosclerosis of coronary artery bypass graft(s), unspecified, with unspecified angina pectoris: Secondary | ICD-10-CM

## 2021-11-11 DIAGNOSIS — Z79899 Other long term (current) drug therapy: Secondary | ICD-10-CM

## 2021-11-11 DIAGNOSIS — I340 Nonrheumatic mitral (valve) insufficiency: Secondary | ICD-10-CM | POA: Diagnosis not present

## 2021-11-11 DIAGNOSIS — Z7901 Long term (current) use of anticoagulants: Secondary | ICD-10-CM | POA: Diagnosis not present

## 2021-11-12 LAB — BASIC METABOLIC PANEL
BUN/Creatinine Ratio: 14 (ref 12–28)
BUN: 11 mg/dL (ref 10–36)
CO2: 21 mmol/L (ref 20–29)
Calcium: 9.2 mg/dL (ref 8.7–10.3)
Chloride: 101 mmol/L (ref 96–106)
Creatinine, Ser: 0.76 mg/dL (ref 0.57–1.00)
Glucose: 94 mg/dL (ref 70–99)
Potassium: 4.3 mmol/L (ref 3.5–5.2)
Sodium: 138 mmol/L (ref 134–144)
eGFR: 72 mL/min/{1.73_m2} (ref >59)

## 2021-11-14 ENCOUNTER — Encounter: Payer: Self-pay | Admitting: Interventional Cardiology

## 2021-12-01 ENCOUNTER — Other Ambulatory Visit: Payer: Self-pay | Admitting: Interventional Cardiology

## 2021-12-03 ENCOUNTER — Telehealth: Payer: Self-pay | Admitting: Interventional Cardiology

## 2021-12-03 NOTE — Telephone Encounter (Signed)
°  Pt c/o medication issue:  1. Name of Medication: furosemide (LASIX) 40 MG tablet  2. How are you currently taking this medication (dosage and times per day)? Take 80 mg by mouth every other day, alternate with 40 mg by mouth every other day.  3. Are you having a reaction (difficulty breathing--STAT)?   4. What is your medication issue? Pt said, she is in high dose of lasix, she said she read that one of side effects of this meds is hearing lost, she said, she is wearing hearing aid but noticed her right ear is getting worst, she wanted to ask Dr. [name] if they can change the dosage

## 2021-12-03 NOTE — Telephone Encounter (Signed)
Spoke with pt and she states that her right ear was feeling stopped up and she's tried everything she knows to get it opened up and it's not working.  States she read the insert from her Furosemide and it mentioned that it can cause hearing loss.  Explained to the pt what she's describing sounds more like allergy/sinus issues and not likely related to her Furosemide.  Advised pt to reach out to PCP to have her ear evaluated and if they feel it is related to Furosemide she can either follow their instructions or contact us at that time.  Pt agreeable to plan.

## 2021-12-13 NOTE — Progress Notes (Signed)
Cardiology Office Note:    Date:  12/14/2021   ID:  [name], DOB 1924/01/31, MRN [id number]  PCP:  [name], MD  Cardiologist:  [name], MD   Referring MD: [name], MD   Chief Complaint  Patient presents with   Coronary Artery Disease    History of Present Illness:    Maria [name] is a 88 y.o. female with a hx of  paroxysmal atrial fibrillation, amiodarone therapy, bilateral carotid stenosis, coronary artery disease with multivessel coronary bypass grafting 2014, chronic kidney disease, chronic anticoagulation with Xarelto, Celiac disease, and hypertension.  Recent TIA leading to switch from Xarelto to apixaban 2.5 mg twice daily.  Presented with AF and poor rate control 11/03/2021. Increased AMIO dose and diuresis.  Monitor demonstrated poor rate control.  She feels better than she did the last time she was here.  Breathing has improved.  Still does not have the energy that she would like.  No episodes of syncope.  At the last visit and reconfirmed today, she does not want electrical cardioversion or invasive management of atrial fibrillation at 87 years of age.  Past Medical History:  Diagnosis Date   Anemia    Atonic bladder 12/11/2013   Carotid artery occlusion    right carotid stenosis 40-60%, 4/08, neg for stenosis repeat study 11/11   Celiac disease    Chronic anticoagulation CHADS2VASC2 score 7 10/18/2014   Chronic diastolic CHF (congestive heart failure) (HCC)    takes Lasix daily   CKD (chronic kidney disease), stage III ([place])    Coronary artery disease    a.  08/2013 (LIMA-LAD, rSVG to LCX and Lt atrial clip),   Depression    DNR (do not resuscitate) 03/28/2021   DVT (deep venous thrombosis) ([place]) 09/2013   Gastric ulcer    6-40month ago   GERD (gastroesophageal reflux disease)    takes Omeprazole daily   GI bleeding    Hemorrhoids    Hot flashes    takes ZOloft 3 times a week   Hypertension    Hypothyroidism    takes  Synthroid daily as result of AMiodarone   Insomnia    Macular degeneration    Mild aortic insufficiency    a. Echo 10/2013: mild AI.   Moderate mitral regurgitation by prior echocardiogram    a. Echo 10/2013.   Moderate obstructive sleep apnea    uses CPAP;sleep study about 4-558monthago   MVP (mitral valve prolapse)    Osteoporosis    PAF (paroxysmal atrial fibrillation) (HCC)    Peripheral edema    left   Presbycusis    Pulmonary hypertension (HCC)    RLS (restless legs syndrome)    Thyroid nodule    8m6mno change 11/11   Urethral stricture    Urinary anastomotic stricture    Urinary retention    Vitamin D deficiency     Past Surgical History:  Procedure Laterality Date   APPENDECTOMY     bilateral cataract surgery     CARDIAC CATHETERIZATION  08-06-13   CORONARY ARTERY BYPASS GRAFT N/A 08/28/2013   Procedure: CORONARY ARTERY BYPASS GRAFTING (CABG) x2 using right greater saphenous vein and left internal mammary artery. ;  Surgeon: EdwGrace IsaacD;  [facility];  Laterality: N/A;   HIP ARTHROPLASTY Right 12/09/2013   Procedure: ARTHROPLASTY BIPOLAR HIP CEMENTED;  Surgeon: FraKerin SalenD;  [facility];  Laterality: Right;  HIP PINNING,CANNULATED Left 03/30/2021   Procedure: CANNULATED HIP PINNING;  Surgeon: Marchia Bond, MD;  Location: [place];  Service: Orthopedics;  Laterality: Left;   INTRAOPERATIVE TRANSESOPHAGEAL ECHOCARDIOGRAM N/A 08/28/2013   Procedure: INTRAOPERATIVE TRANSESOPHAGEAL ECHOCARDIOGRAM;  Surgeon: Grace Isaac, MD;  Location: [place];  Service: Open Heart Surgery;  Laterality: N/A;   LEFT AND RIGHT HEART CATHETERIZATION WITH CORONARY ANGIOGRAM N/A 08/16/2013   Procedure: LEFT AND RIGHT HEART CATHETERIZATION WITH CORONARY ANGIOGRAM;  Surgeon: [name], MD;  Location: [facility] CATH LAB;  Service: Cardiovascular;  Laterality: N/A;   MANDIBLE SURGERY     TCS     TONSILLECTOMY     trigger  thumb     TUBAL LIGATION      Current Medications: Current Meds  Medication Sig   acetaminophen (TYLENOL) 500 MG tablet Take 1,000 mg by mouth every 6 (six) hours as needed (pain).   amiodarone (PACERONE) 200 MG tablet TAKE 1/2 TABLET BY MOUTH DAILY   amLODipine (NORVASC) 2.5 MG tablet Take 1 tablet (2.5 mg total) by mouth daily for 30 days.   apixaban (ELIQUIS) 2.5 MG TABS tablet Take 1 tablet (2.5 mg total) by mouth 2 (two) times daily for 30 days.   cholecalciferol (VITAMIN D3) 10 MCG (400 UNIT) TABS tablet Take 1 tablet (400 Units total) by mouth daily.   feeding supplement (ENSURE ENLIVE / ENSURE PLUS) LIQD Take 237 mLs by mouth 2 (two) times daily between meals.   furosemide (LASIX) 40 MG tablet Take 80 mg by mouth every other day, alternate with 40 mg by mouth every other day.   Levothyroxine Sodium 25 MCG CAPS Take 1 capsule by mouth daily before breakfast.   magnesium hydroxide (MILK OF MAGNESIA) 400 MG/5ML suspension Take 15 mLs by mouth at bedtime as needed for mild constipation.   melatonin 5 MG TABS Take 1-2 tablets (5-10 mg total) by mouth at bedtime.   Menthol, Topical Analgesic, (ICY HOT EX) Apply 1 application topically daily as needed (pain).   Nutritional Supplements (BOOST PO) Take by mouth.   Polyethyl Glycol-Propyl Glycol (SYSTANE) 0.4-0.3 % SOLN Place 1 drop into both eyes at bedtime.   sertraline (ZOLOFT) 50 MG tablet Take 50 mg by mouth every morning.     Allergies:   Fentanyl; Amoxicillin; Atorvastatin; Bupropion; Codeine; Fosamax [alendronate sodium]; Gabapentin; Gluten meal; Hydrochlorothiazide; and Tetanus toxoid, adsorbed   Social History   Socioeconomic History   Marital status: Widowed    Spouse name: Not on file   Number of children: Not on file   Years of education: Not on file   Highest education level: Not on file  Occupational History   Occupation: retired  Tobacco Use   Smoking status: Never   Smokeless tobacco: Never  Vaping Use   Vaping  Use: Never used  Substance and Sexual Activity   Alcohol use: No   Drug use: No   Sexual activity: Yes    Birth control/protection: Surgical  Other Topics Concern   Not on file  Social History Narrative   Lives in retirement community   Right handed   Social Determinants of Health   Financial Resource Strain: Not on file  Food Insecurity: Not on file  Transportation Needs: Not on file  Physical Activity: Not on file  Stress: Not on file  Social Connections: Not on file     Family History: The patient's family history includes AAA (abdominal aortic aneurysm) in her brother; CAD in her father; CVA in her mother;  Colon cancer in her father; Heart attack in her brother, father, and mother; Peripheral vascular disease in her brother.  ROS:   Please see the history of present illness.    A new tracing is not performed all other systems reviewed and are negative.  EKGs/Labs/Other Studies Reviewed:    The following studies were reviewed today:   CONTINUOUS MONITOR 10/02/2021: Study Highlights    Continuous atrial fibrillation, average heart rate 70 bpm While awake, not uncommon to have heart rates as high as 130 bpm.   Overall poor rate control          Patch Wear Time:  14 days and 0 hours (2022-12-17T15:55:12-498 to 2022-12-31T15:55:04-499)   Atrial Fibrillation occurred continuously (100% burden), ranging from 44-139 bpm (avg of 70 bpm). Bundle Branch Block/IVCD was present. Isolated VEs were rare (<1.0%), and no VE Couplets or VE Triplets were present.  EKG:  EKG a new tracing is not performed  Recent Labs: 10/02/2021: ALT 45; Hemoglobin 10.4; NT-Pro BNP 1,013; Platelets 150; TSH 6.130 11/11/2021: BUN 11; Creatinine, Ser 0.76; Potassium 4.3; Sodium 138  Recent Lipid Panel    Component Value Date/Time   CHOL 223 (H) 11/27/2018 0637   TRIG 93 11/27/2018 0637   HDL 74 11/27/2018 0637   CHOLHDL 3.0 11/27/2018 0637   VLDL 19 11/27/2018 0637   LDLCALC 130 (H)  11/27/2018 0637    Physical Exam:    VS:  BP 116/70    Pulse 61    Ht 5' (1.524 m)    Wt 100 lb 6.4 oz (45.5 kg)    SpO2 100%    BMI 19.61 kg/m     Wt Readings from Last 3 Encounters:  12/14/21 100 lb 6.4 oz (45.5 kg)  11/03/21 101 lb (45.8 kg)  10/02/21 104 lb 3.2 oz (47.3 kg)     GEN: Compatible with age. No acute distress HEENT: Normal NECK: No JVD. LYMPHATICS: No lymphadenopathy CARDIAC: No murmur. IIRR no gallop, or edema. VASCULAR:  Normal Pulses. No bruits. RESPIRATORY:  Clear to auscultation without rales, wheezing or rhonchi  ABDOMEN: Soft, non-tender, non-distended, No pulsatile mass, MUSCULOSKELETAL: No deformity  SKIN: Warm and dry NEUROLOGIC:  Alert and oriented x 3 PSYCHIATRIC:  Normal affect   ASSESSMENT:    1. Paroxysmal atrial fibrillation (HCC)   2. Acute on chronic combined systolic and diastolic CHF (congestive heart failure) ([place])   3. Chronic anticoagulation   4. Coronary artery disease involving coronary bypass graft of native heart with angina pectoris ([place])   5. On amiodarone therapy   6. Nonrheumatic mitral valve regurgitation   7. Stage 3 chronic kidney disease, unspecified whether stage 3a or 3b CKD ([place])    PLAN:    In order of problems listed above:  Still with relatively poor rate control.  Heart rate 100 upon walking into the building.  Discontinue amlodipine.  Start low-dose diltiazem CD 120 mg/day.  Return in 2 to 4 weeks at which time if heart rate is less than 80, consider decreasing amiodarone back to 100 mg/day. Acute volume overload has resolved with uptitrated diuretic regimen. Continue low-dose apixaban No anginal complaints at this time.  Continue diet.  She is statin intolerant. Continue 200 mg/day but with hopes of decreasing to 100 mg/day on the next visit if heart rate is better controlled by switching amlodipine for diltiazem. Systolic murmur is present. Most recent renal profile revealed a creatinine of 0.76 in  January.   Follow-up in 2 to 4 weeks.  Consider decreasing amiodarone to 100 mg/day if heart rate control is less than 100 bpm.   Medication Adjustments/Labs and Tests Ordered: Current medicines are reviewed at length with the patient today.  Concerns regarding medicines are outlined above.  No orders of the defined types were placed in this encounter.  No orders of the defined types were placed in this encounter.   There are no Patient Instructions on file for this visit.   Signed, [name], MD  12/14/2021 2:14 PM    [facility]

## 2021-12-14 ENCOUNTER — Encounter: Payer: Self-pay | Admitting: Interventional Cardiology

## 2021-12-14 ENCOUNTER — Other Ambulatory Visit: Payer: Self-pay

## 2021-12-14 ENCOUNTER — Ambulatory Visit (INDEPENDENT_AMBULATORY_CARE_PROVIDER_SITE_OTHER): Payer: Medicare Other | Admitting: Interventional Cardiology

## 2021-12-14 ENCOUNTER — Other Ambulatory Visit: Payer: Self-pay | Admitting: Interventional Cardiology

## 2021-12-14 VITALS — BP 116/70 | HR 61 | Ht 60.0 in | Wt 100.4 lb

## 2021-12-14 DIAGNOSIS — I340 Nonrheumatic mitral (valve) insufficiency: Secondary | ICD-10-CM

## 2021-12-14 DIAGNOSIS — Z7901 Long term (current) use of anticoagulants: Secondary | ICD-10-CM

## 2021-12-14 DIAGNOSIS — I5043 Acute on chronic combined systolic (congestive) and diastolic (congestive) heart failure: Secondary | ICD-10-CM

## 2021-12-14 DIAGNOSIS — Z79899 Other long term (current) drug therapy: Secondary | ICD-10-CM | POA: Diagnosis not present

## 2021-12-14 DIAGNOSIS — I480 Paroxysmal atrial fibrillation: Secondary | ICD-10-CM

## 2021-12-14 DIAGNOSIS — I25709 Atherosclerosis of coronary artery bypass graft(s), unspecified, with unspecified angina pectoris: Secondary | ICD-10-CM

## 2021-12-14 DIAGNOSIS — N183 Chronic kidney disease, stage 3 unspecified: Secondary | ICD-10-CM | POA: Diagnosis not present

## 2021-12-14 MED ORDER — AMIODARONE HCL 200 MG PO TABS: 200.0000 mg | ORAL_TABLET | Freq: Every day | ORAL | 3 refills | Status: DC

## 2021-12-14 MED ORDER — FUROSEMIDE 40 MG PO TABS: ORAL_TABLET | ORAL | 3 refills | Status: AC

## 2021-12-14 MED ORDER — APIXABAN 2.5 MG PO TABS: 2.5000 mg | ORAL_TABLET | Freq: Two times a day (BID) | ORAL | 0 refills | Status: AC

## 2021-12-14 MED ORDER — DILTIAZEM HCL ER COATED BEADS 120 MG PO CP24: 120.0000 mg | ORAL_CAPSULE | Freq: Every day | ORAL | 3 refills | Status: AC

## 2021-12-14 NOTE — Patient Instructions (Signed)
Medication Instructions:  Your physician has recommended you make the following change in your medication:   1) STOP Amlodipine 2) START Diltiazem 149m once daily  *If you need a refill on your cardiac medications before your next appointment, please call your pharmacy*   Lab Work: NONE If you have labs (blood work) drawn today and your tests are completely normal, you will receive your results only by: [place](if you have MyChart) OR A paper copy in the mail If you have any lab test that is abnormal or we need to change your treatment, we will call you to review the results.   Testing/Procedures: NONE   Follow-Up: At [facility] you and your health needs are our priority.  As part of our continuing mission to provide you with exceptional heart care, we have created designated Provider Care Teams.  These Care Teams include your primary Cardiologist (physician) and Advanced Practice Providers (APPs -  Physician Assistants and Nurse Practitioners) who all work together to provide you with the care you need, when you need it.  We recommend signing up for the patient portal called "MyChart".  Sign up information is provided on this After Visit Summary.  MyChart is used to connect with patients for Virtual Visits (Telemedicine).  Patients are able to view lab/test results, encounter notes, upcoming appointments, etc.  Non-urgent messages can be sent to your provider as well.   To learn more about what you can do with MyChart, go to [email or web address]    Your next appointment:   1 month(s)  The format for your next appointment:   In Person  Provider:   HSinclair Grooms MD, or PA or NP

## 2022-01-12 NOTE — Progress Notes (Signed)
?Cardiology Office Note:   ? ?Date:  01/14/2022  ? ?ID:  Maria [name], DOB [birth year], MRN [id number] ? ?PCP:  [name], MD  ?Cardiologist:  [name], MD  ? ?Referring MD: [name], MD  ? ?Chief Complaint  ?Patient presents with  ? Atrial Fibrillation  ? Congestive Heart Failure  ? ? ?History of Present Illness:   ? ?Maria [name] is a 88 y.o. female with a hx of paroxysmal atrial fibrillation, amiodarone therapy, bilateral carotid stenosis, coronary artery disease with multivessel coronary bypass grafting 2014, chronic kidney disease, chronic anticoagulation with Xarelto, Celiac disease, and hypertension.  Recent TIA leading to switch from Xarelto to apixaban 2.5 mg twice daily. ? ?Diltiazem added last visit for rate control. Back for clinical check. ? ?Is better with rate control improved by diltiazem.  Still out of rhythm based on auscultation and palpation of heart rate/pulse. ? ?She has excepted being in atrial fibrillation.  Did not want to undergo cardioversion, and we agreed upon this is a shared decision making process. ? ?Past Medical History:  ?Diagnosis Date  ? Anemia   ? Atonic bladder 12/11/2013  ? Carotid artery occlusion   ? right carotid stenosis 40-60%, 4/08, neg for stenosis repeat study 11/11  ? Celiac disease   ? Chronic anticoagulation CHADS2VASC2 score 7 10/18/2014  ? Chronic diastolic CHF (congestive heart failure) ([place])   ? takes Lasix daily  ? CKD (chronic kidney disease), stage III ([place])   ? Coronary artery disease   ? a.  08/2013 (LIMA-LAD, rSVG to LCX and Lt atrial clip),  ? Depression   ? DNR (do not resuscitate) 03/28/2021  ? DVT (deep venous thrombosis) ([place]) 09/2013  ? Gastric ulcer   ? 6-44month ago  ? GERD (gastroesophageal reflux disease)   ? takes Omeprazole daily  ? GI bleeding   ? Hemorrhoids   ? Hot flashes   ? takes ZOloft 3 times a week  ? Hypertension   ? Hypothyroidism   ? takes Synthroid daily as result of AMiodarone  ? Insomnia   ? Macular  degeneration   ? Mild aortic insufficiency   ? a. Echo 10/2013: mild AI.  ? Moderate mitral regurgitation by prior echocardiogram   ? a. Echo 10/2013.  ? Moderate obstructive sleep apnea   ? uses CPAP;sleep study about 4-544monthago  ? MVP (mitral valve prolapse)   ? Osteoporosis   ? PAF (parox[place]mal [place] fibrillation) ([place]  ? Peripheral edema   ? left  ? Presbycusis   ? Pulmonary hypertension ([place]  ? RLS (restless legs syndrome)   ? Thyroid nodule   ? 57m48mno change 11/11  ? Urethral stricture   ? Urinary anastomotic stricture   ? Urinary retention   ? Vitamin D deficiency   ? ? ?Past Surgical History:  ?Procedure Laterality Date  ? APPENDECTOMY    ? bilateral cataract surgery    ? CARDIAC CATHETERIZATION  08-06-13  ? CORONARY ARTERY BYPASS GRAFT N/A 08/28/2013  ? Procedure: CORONARY ARTERY BYPASS GRAFTING (CABG) x2 using right greater saphenous vein and left internal mammary artery. ;  Surgeon: EdwGrace IsaacD;  [facility] [place];  Laterality: N/A;  ? HIP ARTHROPLASTY Right 12/09/2013  ? Procedure: ARTHROPLASTY BIPOLAR HIP CEMENTED;  Surgeon: FraKerin SalenD;  [facility];  Laterality: Right;  ? HIP PINNING,CANNULATED Left 03/30/2021  ? Procedure: CANNULATED HIP PINNING;  Surgeon: LanMardelle Matte  Vonna Kotyk, MD;  Location: [place];  Service: Orthopedics;  Laterality: Left;  ? INTRAOPERATIVE TRANSESOPHAGEAL ECHOCARDIOGRAM N/A 08/28/2013  ? Procedure: INTRAOPERATIVE TRANSESOPHAGEAL ECHOCARDIOGRAM;  Surgeon: Grace Isaac, MD;  Location: [place];  Service: Open Heart Surgery;  Laterality: N/A;  ? LEFT AND RIGHT HEART CATHETERIZATION WITH CORONARY ANGIOGRAM N/A 08/16/2013  ? Procedure: LEFT AND RIGHT HEART CATHETERIZATION WITH CORONARY ANGIOGRAM;  Surgeon: [name], MD;  [facility] C[facility];  Service: Cardiovascular;  Laterality: N/A;  ? MANDIBLE SURGERY    ? TCS    ? TONSILLECTOMY    ? trigger thumb    ? TUBAL LIGATION    ? ? ?Current Medications: ?Current  Meds  ?Medication Sig  ? acetaminophen (TYLENOL) 500 MG tablet Take 1,000 mg by mouth every 6 (six) hours as needed (pain).  ? amiodarone (PACERONE) 200 MG tablet Take 1 tablet (200 mg total) by mouth daily.  ? cholecalciferol (VITAMIN D3) 10 MCG (400 UNIT) TABS tablet Take 1 tablet (400 Units total) by mouth daily.  ? diltiazem (CARDIZEM CD) 120 MG 24 hr capsule Take 1 capsule (120 mg total) by mouth daily.  ? feeding supplement (ENSURE ENLIVE / ENSURE PLUS) LIQD Take 237 mLs by mouth 2 (two) times daily between meals.  ? furosemide (LASIX) 40 MG tablet Take 80 mg by mouth every other day, alternate with 40 mg by mouth every other day.  ? Levothyroxine Sodium 25 MCG CAPS Take 1 capsule by mouth daily before breakfast.  ? magnesium hydroxide (MILK OF MAGNESIA) 400 MG/5ML suspension Take 15 mLs by mouth at bedtime as needed for mild constipation.  ? melatonin 5 MG TABS Take 1-2 tablets (5-10 mg total) by mouth at bedtime.  ? Menthol, Topical Analgesic, (ICY HOT EX) Apply 1 application topically daily as needed (pain).  ? Multiple Vitamin (MULTIVITAMIN WITH MINERALS) TABS tablet Take 1 tablet by mouth daily.  ? Nutritional Supplements (BOOST PO) Take by mouth.  ? Polyethyl Glycol-Propyl Glycol (SYSTANE) 0.4-0.3 % SOLN Place 1 drop into both eyes at bedtime.  ? polyethylene glycol (MIRALAX / GLYCOLAX) 17 g packet Take 17 g by mouth daily as needed for moderate constipation or mild constipation.  ? senna-docusate (SENOKOT-S) 8.6-50 MG tablet Take 1 tablet by mouth daily.  ? sertraline (ZOLOFT) 50 MG tablet Take 50 mg by mouth every morning.  ?  ? ?Allergies:   Fentanyl; Amoxicillin; Atorvastatin; Bupropion; Codeine; Fosamax [alendronate sodium]; Gabapentin; Gluten meal; Hydrochlorothiazide; and Tetanus toxoid, adsorbed  ? ?Social History  ? ?Socioeconomic History  ? Marital status: Widowed  ?  Spouse name: Not on file  ? Number of children: Not on file  ? Years of education: Not on file  ? Highest education level: Not  on file  ?Occupational History  ? Occupation: retired  ?Tobacco Use  ? Smoking status: Never  ? Smokeless tobacco: Never  ?Vaping Use  ? Vaping Use: Never used  ?Substance and Sexual Activity  ? Alcohol use: No  ? Drug use: No  ? Sexual activity: Yes  ?  Birth control/protection: Surgical  ?Other Topics Concern  ? Not on file  ?Social History Narrative  ? Lives in retirement community  ? Right handed  ? ?Social Determinants of Health  ? ?Financial Resource Strain: Not on file  ?Food Insecurity: Not on file  ?Transportation Needs: Not on file  ?Physical Activity: Not on file  ?Stress: Not on file  ?Social Connections: Not on file  ?  ? ?Family History: ?The  patient's family history includes AAA (abdominal aortic aneurysm) in her brother; CAD in her father; CVA in her mother; Colon cancer in her father; Heart attack in her brother, father, and mother; Peripheral vascular disease in her brother. ? ?ROS:   ?Please see the history of present illness.    ?Has nausea and therefore takes Amio at night.  She feels amiodarone contributes.  All other systems reviewed and are negative. ? ?EKGs/Labs/Other Studies Reviewed:   ? ?The following studies were reviewed today: ?No new imaging ? ?EKG:  EKG not repeated ? ?Recent Labs: ?10/02/2021: ALT 45; Hemoglobin 10.4; NT-Pro BNP 1,013; Platelets 150; TSH 6.130 ?11/11/2021: BUN 11; Creatinine, Ser 0.76; Potassium 4.3; Sodium 138  ?Recent Lipid Panel ?   ?Component Value Date/Time  ? CHOL 223 (H) 11/27/2018 7829  ? TRIG 93 11/27/2018 0637  ? HDL 74 11/27/2018 0637  ? CHOLHDL 3.0 11/27/2018 5621  ? VLDL 19 11/27/2018 0637  ? [place] 130 (H) 11/27/2018 3086  ? ? ?Physical Exam:   ? ?VS:  BP 132/60   Pulse 89   Ht 5' (1.524 m)   Wt 101 lb (45.8 kg)   SpO2 97%   BMI 19.73 kg/m?    ? ?Wt Readings from Last 3 Encounters:  ?01/14/22 101 lb (45.8 kg)  ?12/14/21 100 lb 6.4 oz (45.5 kg)  ?11/03/21 101 lb (45.8 kg)  ?  ? ?GEN: Frail. No acute distress ?HEENT: Normal ?NECK: No  JVD. ?LYMPHATICS: No lymphadenopathy ?CARDIAC: Apical systolic murmur.  Irregularly irregular RR without gallop, or edema. ?VASCULAR:  Normal Pulses. No bruits. ?RESPIRATORY:  Clear to auscultation without rales, wheezing or rhon

## 2022-01-14 ENCOUNTER — Encounter: Payer: Self-pay | Admitting: Interventional Cardiology

## 2022-01-14 ENCOUNTER — Other Ambulatory Visit: Payer: Self-pay

## 2022-01-14 ENCOUNTER — Ambulatory Visit (INDEPENDENT_AMBULATORY_CARE_PROVIDER_SITE_OTHER): Payer: Medicare Other | Admitting: Interventional Cardiology

## 2022-01-14 VITALS — BP 132/60 | HR 89 | Ht 60.0 in | Wt 101.0 lb

## 2022-01-14 DIAGNOSIS — Z79899 Other long term (current) drug therapy: Secondary | ICD-10-CM

## 2022-01-14 DIAGNOSIS — I5043 Acute on chronic combined systolic (congestive) and diastolic (congestive) heart failure: Secondary | ICD-10-CM

## 2022-01-14 DIAGNOSIS — I25709 Atherosclerosis of coronary artery bypass graft(s), unspecified, with unspecified angina pectoris: Secondary | ICD-10-CM | POA: Diagnosis not present

## 2022-01-14 DIAGNOSIS — I340 Nonrheumatic mitral (valve) insufficiency: Secondary | ICD-10-CM | POA: Diagnosis not present

## 2022-01-14 DIAGNOSIS — I480 Paroxysmal atrial fibrillation: Secondary | ICD-10-CM | POA: Diagnosis not present

## 2022-01-14 DIAGNOSIS — N183 Chronic kidney disease, stage 3 unspecified: Secondary | ICD-10-CM | POA: Diagnosis not present

## 2022-01-14 DIAGNOSIS — Z7901 Long term (current) use of anticoagulants: Secondary | ICD-10-CM

## 2022-01-14 NOTE — Patient Instructions (Signed)
Medication Instructions:  ?Your physician recommends that you continue on your current medications as directed. Please refer to the Current Medication list given to you today. ? ?*If you need a refill on your cardiac medications before your next appointment, please call your pharmacy* ? ? ?Lab Work: ?None ?If you have labs (blood work) drawn today and your tests are completely normal, you will receive your results only by: ?MyChart Message (if you have MyChart) OR ?A paper copy in the mail ?If you have any lab test that is abnormal or we need to change your treatment, we will call you to review the results. ? ? ?Testing/Procedures: ?None ? ? ?Follow-Up: ?At [facility], you and your health needs are our priority.  As part of our continuing mission to provide you with exceptional heart care, we have created designated Provider Care Teams.  These Care Teams include your primary Cardiologist (physician) and Advanced Practice Providers (APPs -  Physician Assistants and Nurse Practitioners) who all work together to provide you with the care you need, when you need it. ? ?We recommend signing up for the patient portal called "MyChart".  Sign up information is provided on this After Visit Summary.  MyChart is used to connect with patients for Virtual Visits (Telemedicine).  Patients are able to view lab/test results, encounter notes, upcoming appointments, etc.  Non-urgent messages can be sent to your provider as well.   ?To learn more about what you can do with MyChart, go to [email or web address].   ? ?Your next appointment:   ?6 month(s) ? ?The format for your next appointment:   ?In Person ? ?Provider:   ?[name], MD  ? ? ?Other Instructions ?  ?

## 2022-01-15 ENCOUNTER — Ambulatory Visit: Payer: Medicare Other | Admitting: Interventional Cardiology

## 2022-03-07 ENCOUNTER — Encounter (HOSPITAL_COMMUNITY): Payer: Self-pay

## 2022-03-07 ENCOUNTER — Other Ambulatory Visit: Payer: Self-pay

## 2022-03-07 ENCOUNTER — Emergency Department (HOSPITAL_COMMUNITY)
Admission: EM | Admit: 2022-03-07 | Discharge: 2022-03-08 | Disposition: A | Discharge status: Home / Self Care | Payer: Medicare Other | Attending: Emergency Medicine | Admitting: Emergency Medicine

## 2022-03-07 DIAGNOSIS — Z7901 Long term (current) use of anticoagulants: Secondary | ICD-10-CM | POA: Diagnosis not present

## 2022-03-07 DIAGNOSIS — R609 Edema, unspecified: Secondary | ICD-10-CM | POA: Diagnosis not present

## 2022-03-07 DIAGNOSIS — M79605 Pain in left leg: Secondary | ICD-10-CM | POA: Diagnosis not present

## 2022-03-07 DIAGNOSIS — I4891 Unspecified atrial fibrillation: Secondary | ICD-10-CM | POA: Insufficient documentation

## 2022-03-07 DIAGNOSIS — I10 Essential (primary) hypertension: Secondary | ICD-10-CM | POA: Diagnosis not present

## 2022-03-07 DIAGNOSIS — R5381 Other malaise: Secondary | ICD-10-CM | POA: Diagnosis not present

## 2022-03-07 LAB — CBC WITH DIFFERENTIAL/PLATELET
Abs Immature Granulocytes: 0.01 10*3/uL (ref 0.00–0.07)
Basophils Absolute: 0 10*3/uL (ref 0.0–0.1)
Basophils Relative: 1 %
Eosinophils Absolute: 0.1 10*3/uL (ref 0.0–0.5)
Eosinophils Relative: 1 %
HCT: 37.7 % (ref 36.0–46.0)
Hemoglobin: 12 g/dL (ref 12.0–15.0)
Immature Granulocytes: 0 %
Lymphocytes Relative: 19 %
Lymphs Abs: 1.1 10*3/uL (ref 0.7–4.0)
MCH: 31.4 pg (ref 26.0–34.0)
MCHC: 31.8 g/dL (ref 30.0–36.0)
MCV: 98.7 fL (ref 80.0–100.0)
Monocytes Absolute: 0.4 10*3/uL (ref 0.1–1.0)
Monocytes Relative: 7 %
Neutro Abs: 4.1 10*3/uL (ref 1.7–7.7)
Neutrophils Relative %: 72 %
Platelets: 196 10*3/uL (ref 150–400)
RBC: 3.82 MIL/uL — ABNORMAL LOW (ref 3.87–5.11)
RDW: 14.8 % (ref 11.5–15.5)
WBC: 5.7 10*3/uL (ref 4.0–10.5)
nRBC: 0 % (ref 0.0–0.2)

## 2022-03-07 LAB — BASIC METABOLIC PANEL
Anion gap: 10 (ref 5–15)
BUN: 15 mg/dL (ref 8–23)
CO2: 22 mmol/L (ref 22–32)
Calcium: 9 mg/dL (ref 8.9–10.3)
Chloride: 102 mmol/L (ref 98–111)
Creatinine, Ser: 0.9 mg/dL (ref 0.44–1.00)
GFR, Estimated: 59 mL/min — ABNORMAL LOW (ref >60)
Glucose, Bld: 90 mg/dL (ref 70–99)
Potassium: 5.3 mmol/L — ABNORMAL HIGH (ref 3.5–5.1)
Sodium: 134 mmol/L — ABNORMAL LOW (ref 135–145)

## 2022-03-07 LAB — PROTIME-INR
INR: 1.3 — ABNORMAL HIGH (ref 0.8–1.2)
Prothrombin Time: 16.3 seconds — ABNORMAL HIGH (ref 11.4–15.2)

## 2022-03-07 NOTE — ED Provider Triage Note (Signed)
Emergency Medicine Provider Triage Evaluation Note ? ?Maria [name] , a 88 y.o. female  was evaluated in triage.  Pt complains of left leg pain.  This has been significantly worse over the past day.  She takes Eliquis due to a history of DVT and A-fib. ? ?She is concerned she may have a DVT. ? ? ?Physical Exam  ?BP 116/80   Pulse 71   Temp 97.9 ?F (36.6 ?C) (Oral)   Resp 16   Ht 5' (1.524 m)   Wt 44.5 kg   SpO2 96%   BMI 19.14 kg/m?  ?Gen:   Awake, no distress   ?Resp:  Normal effort  ?MSK:   Moves extremities without difficulty  ?Other:  Edema of the left lower leg around the knee.  2+ DP/PT pulse in left foot.  ? ?Medical Decision Making  ?Medically screening exam initiated at 9:26 PM.  Appropriate orders placed.  Maria [name] was informed that the remainder of the evaluation will be completed by another provider, this initial triage assessment does not replace that evaluation, and the importance of remaining in the ED until their evaluation is complete. ? ?Will check baseline labs.  I discussed with patient and family that we don't have the ability to perform DVT studies at night and that we may have her come back in the AM for a study.   ?  ?Lorin Glass, PA-C ?03/07/22 2130 ? ?

## 2022-03-07 NOTE — ED Triage Notes (Addendum)
Patient arrives with [place] ambulance service from [place] c/o leg pain in her left calf that started about 48 hours ago; pt reports it is painful to touch. Pt has hx of DVT in right leg and is on Eliquis for afib. Upon visualizing pt's left calf, there was no redness or warmth. Pt is a&o x4 with daughter at the bedside. ?

## 2022-03-08 ENCOUNTER — Ambulatory Visit (HOSPITAL_COMMUNITY): Payer: Medicare Other | Attending: Emergency Medicine

## 2022-03-08 NOTE — Discharge Instructions (Signed)
Please return to get your ultrasound.  If it is positive you need to talk to your doctor about switching to a different anticoagulant.  I suspect will be negative and if that is the case try lidocaine patches, ice, cold, Tylenol or other measures to make it feel better.  If it does not improve follow-up with your PCP for further management.  ?

## 2022-03-08 NOTE — ED Provider Notes (Signed)
?[facility] ?Provider Note ? ? ?CSN: [id number] ?Arrival date & time: 03/07/22  2057 ? ?  ? ?History ? ?Chief Complaint  ?Patient presents with  ? Leg Pain  ? ? ?Maria [name] is a 88 y.o. female. ? ?H/o DVT on R leg, afib on eliquis here with atraumatic pain to left leg for last couple days. Worse when extending after sitting (like when standing from a seated position). No new swelling or calf pain. Hasn't tried anything for the symptoms.  ? ? ?Leg Pain ? ?  ? ?Home Medications ?Prior to Admission medications   ?Medication Sig Start Date End Date Taking? Authorizing Provider  ?acetaminophen (TYLENOL) 500 MG tablet Take 1,000 mg by mouth every 6 (six) hours as needed (pain).    [provider]  ?amiodarone (PACERONE) 200 MG tablet Take 1 tablet (200 mg total) by mouth daily. 12/14/21   Belva Crome, MD  ?apixaban (ELIQUIS) 2.5 MG TABS tablet Take 1 tablet (2.5 mg total) by mouth 2 (two) times daily. 12/14/21 01/13/22  Belva Crome, MD  ?cholecalciferol (VITAMIN D3) 10 MCG (400 UNIT) TABS tablet Take 1 tablet (400 Units total) by mouth daily. 04/16/21   Love, Ivan Anchors, PA-C  ?diltiazem (CARDIZEM CD) 120 MG 24 hr capsule Take 1 capsule (120 mg total) by mouth daily. 12/14/21 03/14/22  Belva Crome, MD  ?famotidine (PEPCID) 10 MG tablet Take 1 tablet (10 mg total) by mouth 2 (two) times daily. ?Patient not taking: Reported on 01/14/2022 04/15/21   Bary Leriche, PA-C  ?feeding supplement (ENSURE ENLIVE / ENSURE PLUS) LIQD Take 237 mLs by mouth 2 (two) times daily between meals. 04/02/21   Mariel Aloe, MD  ?ferrous sulfate 325 (65 FE) MG tablet Take 1 tablet (325 mg total) by mouth daily with breakfast. ?Patient not taking: Reported on 01/14/2022 04/03/21   Mariel Aloe, MD  ?furosemide (LASIX) 40 MG tablet Take 80 mg by mouth every other day, alternate with 40 mg by mouth every other day. 12/14/21   Belva Crome, MD  ?Levothyroxine Sodium 25 MCG CAPS Take 1 capsule  by mouth daily before breakfast.    [provider]  ?magnesium hydroxide (MILK OF MAGNESIA) 400 MG/5ML suspension Take 15 mLs by mouth at bedtime as needed for mild constipation.    [provider]  ?melatonin 5 MG TABS Take 1-2 tablets (5-10 mg total) by mouth at bedtime. 04/15/21   Bary Leriche, PA-C  ?Menthol, Topical Analgesic, (ICY HOT EX) Apply 1 application topically daily as needed (pain).    [provider]  ?Multiple Vitamin (MULTIVITAMIN WITH MINERALS) TABS tablet Take 1 tablet by mouth daily. 04/03/21   Mariel Aloe, MD  ?Nutritional Supplements (BOOST PO) Take by mouth.    [provider]  ?Polyethyl Glycol-Propyl Glycol (SYSTANE) 0.4-0.3 % SOLN Place 1 drop into both eyes at bedtime.    [provider]  ?polyethylene glycol (MIRALAX / GLYCOLAX) 17 g packet Take 17 g by mouth daily as needed for moderate constipation or mild constipation. 04/02/21   Mariel Aloe, MD  ?senna-docusate (SENOKOT-S) 8.6-50 MG tablet Take 1 tablet by mouth daily. 04/16/21   Love, Ivan Anchors, PA-C  ?sertraline (ZOLOFT) 50 MG tablet Take 50 mg by mouth every morning. 03/12/21   [provider]  ?traMADol (ULTRAM) 50 MG tablet Take 2 tablets (100 mg total) by mouth 3 (three) times daily. ?Patient not taking: Reported on 01/14/2022 04/15/21  Bary Leriche, PA-C  ?   ? ?Allergies    ?Fentanyl; Amoxicillin; Atorvastatin; Bupropion; Codeine; Fosamax [alendronate sodium]; Gabapentin; Gluten meal; Hydrochlorothiazide; and Tetanus toxoid, adsorbed   ? ?Review of Systems   ?Review of Systems ? ?Physical Exam ?Updated Vital Signs ?BP (!) 146/86   Pulse 64   Temp 98 ?F (36.7 ?C) (Oral)   Resp 19   Ht 5' (1.524 m)   Wt 44.5 kg   SpO2 99%   BMI 19.14 kg/m?  ?Physical Exam ?Vitals and nursing note reviewed.  ?Constitutional:   ?   Appearance: She is well-developed.  ?HENT:  ?   Head: Normocephalic and atraumatic.  ?   Nose: Nose normal. No congestion.  ?   Mouth/Throat:  ?    Mouth: Mucous membranes are moist.  ?   Pharynx: Oropharynx is clear.  ?Cardiovascular:  ?   Rate and Rhythm: Normal rate and regular rhythm.  ?Pulmonary:  ?   Effort: No respiratory distress.  ?   Breath sounds: No stridor.  ?Abdominal:  ?   General: Abdomen is flat. There is no distension.  ?Musculoskeletal:     ?   General: Tenderness (proximal fibular on left leg, no erythema, induration or fluctuance. no popliteal swelling. no calf ttp) present. Normal range of motion.  ?   Cervical back: Normal range of motion.  ?   Left lower leg: Edema (mild, reportedly chronic) present.  ?Skin: ?   General: Skin is warm and dry.  ?Neurological:  ?   General: No focal deficit present.  ?   Mental Status: She is alert.  ? ? ?ED Results / Procedures / Treatments   ?Labs ?(all labs ordered are listed, but only abnormal results are displayed) ?Labs Reviewed  ?BASIC METABOLIC PANEL - Abnormal; Notable for the following components:  ?    Result Value  ? Sodium 134 (*)   ? Potassium 5.3 (*)   ? GFR, Estimated 59 (*)   ? All other components within normal limits  ?CBC WITH DIFFERENTIAL/PLATELET - Abnormal; Notable for the following components:  ? RBC 3.82 (*)   ? All other components within normal limits  ?PROTIME-INR - Abnormal; Notable for the following components:  ? Prothrombin Time 16.3 (*)   ? INR 1.3 (*)   ? All other components within normal limits  ? ? ?EKG ?None ? ?Radiology ?No results found. ? ?Procedures ?Procedures  ? ? ?Medications Ordered in ED ?Medications - No data to display ? ?ED Course/ Medical Decision Making/ A&P ?  ?                        ?Medical Decision Making ? ?Doubt dvt with story and being on eliquis, but patient will return for [place]. Suspect more MSK cause, will try some supportive care at home and follow up with PCP.  ? ?Final Clinical Impression(s) / ED Diagnoses ?Final diagnoses:  ?Left leg pain  ? ? ?Rx / DC Orders ?ED Discharge Orders   ? ?      Ordered  ?  VAS [place] LOWER EXTREMITY VENOUS (DVT)   Status:  Canceled       ? 03/08/22 0032  ?  LE VENOUS       ? 03/08/22 0035  ? ?  ?  ? ?  ? ? ?  ?Merrily Pew, MD ?03/08/22 2080 ? ?

## 2022-03-09 ENCOUNTER — Other Ambulatory Visit (HOSPITAL_COMMUNITY): Payer: Self-pay | Admitting: Emergency Medicine

## 2022-03-09 ENCOUNTER — Ambulatory Visit (HOSPITAL_COMMUNITY)
Admission: RE | Admit: 2022-03-09 | Discharge: 2022-03-09 | Disposition: A | Discharge status: Home / Self Care | Payer: Medicare Other | Source: Ambulatory Visit | Attending: Emergency Medicine | Admitting: Emergency Medicine

## 2022-03-09 DIAGNOSIS — M79605 Pain in left leg: Secondary | ICD-10-CM | POA: Diagnosis not present

## 2022-03-09 DIAGNOSIS — M7989 Other specified soft tissue disorders: Secondary | ICD-10-CM | POA: Insufficient documentation

## 2022-03-09 NOTE — Progress Notes (Signed)
Left lower extremity venous duplex completed. ?Refer to "CV Proc" under chart review to view preliminary results. ? ?03/09/2022 12:56 PM ?Kelby Aline., MHA, RVT, RDCS, RDMS   ?

## 2022-03-18 DIAGNOSIS — S199XXA Unspecified injury of neck, initial encounter: Secondary | ICD-10-CM | POA: Diagnosis not present

## 2022-03-18 DIAGNOSIS — I4891 Unspecified atrial fibrillation: Secondary | ICD-10-CM | POA: Diagnosis not present

## 2022-03-18 DIAGNOSIS — Z885 Allergy status to narcotic agent status: Secondary | ICD-10-CM | POA: Diagnosis not present

## 2022-03-18 DIAGNOSIS — Z887 Allergy status to serum and vaccine status: Secondary | ICD-10-CM | POA: Diagnosis not present

## 2022-03-18 DIAGNOSIS — S0990XA Unspecified injury of head, initial encounter: Secondary | ICD-10-CM | POA: Diagnosis not present

## 2022-03-18 DIAGNOSIS — M25551 Pain in right hip: Secondary | ICD-10-CM | POA: Diagnosis not present

## 2022-03-18 DIAGNOSIS — Z880 Allergy status to penicillin: Secondary | ICD-10-CM | POA: Diagnosis not present

## 2022-03-18 DIAGNOSIS — Z881 Allergy status to other antibiotic agents status: Secondary | ICD-10-CM | POA: Diagnosis not present

## 2022-03-18 DIAGNOSIS — Z888 Allergy status to other drugs, medicaments and biological substances status: Secondary | ICD-10-CM | POA: Diagnosis not present

## 2022-03-18 DIAGNOSIS — W01198A Fall on same level from slipping, tripping and stumbling with subsequent striking against other object, initial encounter: Secondary | ICD-10-CM | POA: Diagnosis not present

## 2022-03-18 DIAGNOSIS — Z7901 Long term (current) use of anticoagulants: Secondary | ICD-10-CM | POA: Diagnosis not present

## 2022-03-31 DIAGNOSIS — Z7409 Other reduced mobility: Secondary | ICD-10-CM | POA: Diagnosis not present

## 2022-03-31 DIAGNOSIS — K900 Celiac disease: Secondary | ICD-10-CM | POA: Diagnosis not present

## 2022-04-20 ENCOUNTER — Other Ambulatory Visit: Payer: Self-pay | Admitting: Interventional Cardiology

## 2022-04-20 DIAGNOSIS — M5459 Other low back pain: Secondary | ICD-10-CM | POA: Diagnosis not present

## 2022-04-20 DIAGNOSIS — M6281 Muscle weakness (generalized): Secondary | ICD-10-CM | POA: Diagnosis not present

## 2022-04-20 DIAGNOSIS — M25551 Pain in right hip: Secondary | ICD-10-CM | POA: Diagnosis not present

## 2022-04-20 DIAGNOSIS — Z9181 History of falling: Secondary | ICD-10-CM | POA: Diagnosis not present

## 2022-04-20 DIAGNOSIS — R262 Difficulty in walking, not elsewhere classified: Secondary | ICD-10-CM | POA: Diagnosis not present

## 2022-04-21 DIAGNOSIS — Z9181 History of falling: Secondary | ICD-10-CM | POA: Diagnosis not present

## 2022-04-21 DIAGNOSIS — M25551 Pain in right hip: Secondary | ICD-10-CM | POA: Diagnosis not present

## 2022-04-21 DIAGNOSIS — M6281 Muscle weakness (generalized): Secondary | ICD-10-CM | POA: Diagnosis not present

## 2022-04-21 DIAGNOSIS — M5459 Other low back pain: Secondary | ICD-10-CM | POA: Diagnosis not present

## 2022-04-21 DIAGNOSIS — R262 Difficulty in walking, not elsewhere classified: Secondary | ICD-10-CM | POA: Diagnosis not present

## 2022-04-22 DIAGNOSIS — Z9181 History of falling: Secondary | ICD-10-CM | POA: Diagnosis not present

## 2022-04-22 DIAGNOSIS — M5459 Other low back pain: Secondary | ICD-10-CM | POA: Diagnosis not present

## 2022-04-22 DIAGNOSIS — M25551 Pain in right hip: Secondary | ICD-10-CM | POA: Diagnosis not present

## 2022-04-22 DIAGNOSIS — M6281 Muscle weakness (generalized): Secondary | ICD-10-CM | POA: Diagnosis not present

## 2022-04-22 DIAGNOSIS — R262 Difficulty in walking, not elsewhere classified: Secondary | ICD-10-CM | POA: Diagnosis not present

## 2022-04-28 DIAGNOSIS — M6281 Muscle weakness (generalized): Secondary | ICD-10-CM | POA: Diagnosis not present

## 2022-04-28 DIAGNOSIS — R262 Difficulty in walking, not elsewhere classified: Secondary | ICD-10-CM | POA: Diagnosis not present

## 2022-04-28 DIAGNOSIS — M5459 Other low back pain: Secondary | ICD-10-CM | POA: Diagnosis not present

## 2022-04-28 DIAGNOSIS — Z9181 History of falling: Secondary | ICD-10-CM | POA: Diagnosis not present

## 2022-04-28 DIAGNOSIS — M25551 Pain in right hip: Secondary | ICD-10-CM | POA: Diagnosis not present

## 2022-04-29 DIAGNOSIS — M5459 Other low back pain: Secondary | ICD-10-CM | POA: Diagnosis not present

## 2022-04-29 DIAGNOSIS — R262 Difficulty in walking, not elsewhere classified: Secondary | ICD-10-CM | POA: Diagnosis not present

## 2022-04-29 DIAGNOSIS — M25551 Pain in right hip: Secondary | ICD-10-CM | POA: Diagnosis not present

## 2022-04-29 DIAGNOSIS — Z9181 History of falling: Secondary | ICD-10-CM | POA: Diagnosis not present

## 2022-04-29 DIAGNOSIS — M6281 Muscle weakness (generalized): Secondary | ICD-10-CM | POA: Diagnosis not present

## 2022-05-03 DIAGNOSIS — Z9181 History of falling: Secondary | ICD-10-CM | POA: Diagnosis not present

## 2022-05-03 DIAGNOSIS — M6281 Muscle weakness (generalized): Secondary | ICD-10-CM | POA: Diagnosis not present

## 2022-05-03 DIAGNOSIS — M25551 Pain in right hip: Secondary | ICD-10-CM | POA: Diagnosis not present

## 2022-05-03 DIAGNOSIS — M5459 Other low back pain: Secondary | ICD-10-CM | POA: Diagnosis not present

## 2022-05-03 DIAGNOSIS — R262 Difficulty in walking, not elsewhere classified: Secondary | ICD-10-CM | POA: Diagnosis not present

## 2022-05-04 DIAGNOSIS — M5459 Other low back pain: Secondary | ICD-10-CM | POA: Diagnosis not present

## 2022-05-04 DIAGNOSIS — M6281 Muscle weakness (generalized): Secondary | ICD-10-CM | POA: Diagnosis not present

## 2022-05-04 DIAGNOSIS — Z9181 History of falling: Secondary | ICD-10-CM | POA: Diagnosis not present

## 2022-05-04 DIAGNOSIS — R262 Difficulty in walking, not elsewhere classified: Secondary | ICD-10-CM | POA: Diagnosis not present

## 2022-05-04 DIAGNOSIS — M25551 Pain in right hip: Secondary | ICD-10-CM | POA: Diagnosis not present

## 2022-05-06 DIAGNOSIS — Z9181 History of falling: Secondary | ICD-10-CM | POA: Diagnosis not present

## 2022-05-06 DIAGNOSIS — M25551 Pain in right hip: Secondary | ICD-10-CM | POA: Diagnosis not present

## 2022-05-06 DIAGNOSIS — M5459 Other low back pain: Secondary | ICD-10-CM | POA: Diagnosis not present

## 2022-05-06 DIAGNOSIS — R262 Difficulty in walking, not elsewhere classified: Secondary | ICD-10-CM | POA: Diagnosis not present

## 2022-05-06 DIAGNOSIS — M6281 Muscle weakness (generalized): Secondary | ICD-10-CM | POA: Diagnosis not present

## 2022-05-10 DIAGNOSIS — Z9181 History of falling: Secondary | ICD-10-CM | POA: Diagnosis not present

## 2022-05-10 DIAGNOSIS — R262 Difficulty in walking, not elsewhere classified: Secondary | ICD-10-CM | POA: Diagnosis not present

## 2022-05-10 DIAGNOSIS — M25551 Pain in right hip: Secondary | ICD-10-CM | POA: Diagnosis not present

## 2022-05-10 DIAGNOSIS — M5459 Other low back pain: Secondary | ICD-10-CM | POA: Diagnosis not present

## 2022-05-10 DIAGNOSIS — M6281 Muscle weakness (generalized): Secondary | ICD-10-CM | POA: Diagnosis not present

## 2022-05-11 DIAGNOSIS — Z9181 History of falling: Secondary | ICD-10-CM | POA: Diagnosis not present

## 2022-05-11 DIAGNOSIS — M5459 Other low back pain: Secondary | ICD-10-CM | POA: Diagnosis not present

## 2022-05-11 DIAGNOSIS — R262 Difficulty in walking, not elsewhere classified: Secondary | ICD-10-CM | POA: Diagnosis not present

## 2022-05-11 DIAGNOSIS — M25551 Pain in right hip: Secondary | ICD-10-CM | POA: Diagnosis not present

## 2022-05-11 DIAGNOSIS — M6281 Muscle weakness (generalized): Secondary | ICD-10-CM | POA: Diagnosis not present

## 2022-05-13 ENCOUNTER — Telehealth: Payer: Self-pay | Admitting: Interventional Cardiology

## 2022-05-13 NOTE — Telephone Encounter (Signed)
Returned call to patient.  Patient reports having no energy and muscle fatigue (especially in legs). Patient reports she still has some SOB with activity, but feels a-fib is improved.  Patient states she has had to receive Iron infusions in the past for low iron levels, last infusion was in December of 2019 or 2020 (patient unsure, states it has been a while).  Patient is asking if Dr. [name] wants her to continue on Amiodarone 238m QD. Per last OV note on 01/14/22:  If still in atrial fibrillation, will discontinue amiodarone and increase diltiazem intensity.  Patient states she thought about calling her PCP's office to request blood panel to be drawn to see if she needs another infusion, but she wanted to wait and see what Dr. STamala Juliansays about the Amiodarone.  Will forward to Dr. STamala Julianto review and advise.

## 2022-05-13 NOTE — Telephone Encounter (Signed)
.  Pt c/o medication issue:  1. Name of Medication: Amiodarone  2. How are you currently taking this medication (dosage and times per day)? 1 tablet daily  3. Are you having a reaction (difficulty breathing--STAT)?   4. What is your medication issue? No energy  and muscle fatigued, especially in her legs- she is feeling better with her afib and swelling is better- she was not sure if Dr [name] wants her to continue taking the Amiodarone

## 2022-05-14 DIAGNOSIS — Z9181 History of falling: Secondary | ICD-10-CM | POA: Diagnosis not present

## 2022-05-14 DIAGNOSIS — M5459 Other low back pain: Secondary | ICD-10-CM | POA: Diagnosis not present

## 2022-05-14 DIAGNOSIS — M25551 Pain in right hip: Secondary | ICD-10-CM | POA: Diagnosis not present

## 2022-05-14 DIAGNOSIS — M6281 Muscle weakness (generalized): Secondary | ICD-10-CM | POA: Diagnosis not present

## 2022-05-14 DIAGNOSIS — R262 Difficulty in walking, not elsewhere classified: Secondary | ICD-10-CM | POA: Diagnosis not present

## 2022-05-14 NOTE — Telephone Encounter (Signed)
Spoke with patient and discussed Dr. Thompson Caul recommendation.  Per Dr. [name]: Continue amio until we have chance to assess AF. This will be determined on return visit, which is probably already scheduled.   Patient verbalized understanding and states she will F/U with PCP regarding lab work and iron levels. Patient expressed appreciation for call.

## 2022-05-17 DIAGNOSIS — M25551 Pain in right hip: Secondary | ICD-10-CM | POA: Diagnosis not present

## 2022-05-17 DIAGNOSIS — R262 Difficulty in walking, not elsewhere classified: Secondary | ICD-10-CM | POA: Diagnosis not present

## 2022-05-17 DIAGNOSIS — Z9181 History of falling: Secondary | ICD-10-CM | POA: Diagnosis not present

## 2022-05-17 DIAGNOSIS — M5459 Other low back pain: Secondary | ICD-10-CM | POA: Diagnosis not present

## 2022-05-17 DIAGNOSIS — M6281 Muscle weakness (generalized): Secondary | ICD-10-CM | POA: Diagnosis not present

## 2022-05-18 DIAGNOSIS — Z9181 History of falling: Secondary | ICD-10-CM | POA: Diagnosis not present

## 2022-05-18 DIAGNOSIS — M5459 Other low back pain: Secondary | ICD-10-CM | POA: Diagnosis not present

## 2022-05-18 DIAGNOSIS — R262 Difficulty in walking, not elsewhere classified: Secondary | ICD-10-CM | POA: Diagnosis not present

## 2022-05-18 DIAGNOSIS — M25551 Pain in right hip: Secondary | ICD-10-CM | POA: Diagnosis not present

## 2022-05-18 DIAGNOSIS — M6281 Muscle weakness (generalized): Secondary | ICD-10-CM | POA: Diagnosis not present

## 2022-05-21 DIAGNOSIS — M5459 Other low back pain: Secondary | ICD-10-CM | POA: Diagnosis not present

## 2022-05-21 DIAGNOSIS — Z9181 History of falling: Secondary | ICD-10-CM | POA: Diagnosis not present

## 2022-05-21 DIAGNOSIS — R262 Difficulty in walking, not elsewhere classified: Secondary | ICD-10-CM | POA: Diagnosis not present

## 2022-05-21 DIAGNOSIS — M6281 Muscle weakness (generalized): Secondary | ICD-10-CM | POA: Diagnosis not present

## 2022-05-21 DIAGNOSIS — M25551 Pain in right hip: Secondary | ICD-10-CM | POA: Diagnosis not present

## 2022-05-24 DIAGNOSIS — M25551 Pain in right hip: Secondary | ICD-10-CM | POA: Diagnosis not present

## 2022-05-24 DIAGNOSIS — Z9181 History of falling: Secondary | ICD-10-CM | POA: Diagnosis not present

## 2022-05-24 DIAGNOSIS — M5459 Other low back pain: Secondary | ICD-10-CM | POA: Diagnosis not present

## 2022-05-24 DIAGNOSIS — M6281 Muscle weakness (generalized): Secondary | ICD-10-CM | POA: Diagnosis not present

## 2022-05-24 DIAGNOSIS — R262 Difficulty in walking, not elsewhere classified: Secondary | ICD-10-CM | POA: Diagnosis not present

## 2022-05-25 DIAGNOSIS — M5459 Other low back pain: Secondary | ICD-10-CM | POA: Diagnosis not present

## 2022-05-25 DIAGNOSIS — R262 Difficulty in walking, not elsewhere classified: Secondary | ICD-10-CM | POA: Diagnosis not present

## 2022-05-25 DIAGNOSIS — Z9181 History of falling: Secondary | ICD-10-CM | POA: Diagnosis not present

## 2022-05-25 DIAGNOSIS — M6281 Muscle weakness (generalized): Secondary | ICD-10-CM | POA: Diagnosis not present

## 2022-05-25 DIAGNOSIS — M25551 Pain in right hip: Secondary | ICD-10-CM | POA: Diagnosis not present

## 2022-05-27 DIAGNOSIS — M6281 Muscle weakness (generalized): Secondary | ICD-10-CM | POA: Diagnosis not present

## 2022-05-27 DIAGNOSIS — Z9181 History of falling: Secondary | ICD-10-CM | POA: Diagnosis not present

## 2022-05-27 DIAGNOSIS — M5459 Other low back pain: Secondary | ICD-10-CM | POA: Diagnosis not present

## 2022-05-27 DIAGNOSIS — R262 Difficulty in walking, not elsewhere classified: Secondary | ICD-10-CM | POA: Diagnosis not present

## 2022-05-27 DIAGNOSIS — M25551 Pain in right hip: Secondary | ICD-10-CM | POA: Diagnosis not present

## 2022-05-31 DIAGNOSIS — M5459 Other low back pain: Secondary | ICD-10-CM | POA: Diagnosis not present

## 2022-05-31 DIAGNOSIS — Z9181 History of falling: Secondary | ICD-10-CM | POA: Diagnosis not present

## 2022-05-31 DIAGNOSIS — M6281 Muscle weakness (generalized): Secondary | ICD-10-CM | POA: Diagnosis not present

## 2022-05-31 DIAGNOSIS — M25551 Pain in right hip: Secondary | ICD-10-CM | POA: Diagnosis not present

## 2022-05-31 DIAGNOSIS — R262 Difficulty in walking, not elsewhere classified: Secondary | ICD-10-CM | POA: Diagnosis not present

## 2022-06-03 DIAGNOSIS — Z9181 History of falling: Secondary | ICD-10-CM | POA: Diagnosis not present

## 2022-06-03 DIAGNOSIS — M6281 Muscle weakness (generalized): Secondary | ICD-10-CM | POA: Diagnosis not present

## 2022-06-03 DIAGNOSIS — M25551 Pain in right hip: Secondary | ICD-10-CM | POA: Diagnosis not present

## 2022-06-03 DIAGNOSIS — R262 Difficulty in walking, not elsewhere classified: Secondary | ICD-10-CM | POA: Diagnosis not present

## 2022-06-03 DIAGNOSIS — M5459 Other low back pain: Secondary | ICD-10-CM | POA: Diagnosis not present

## 2022-06-04 DIAGNOSIS — M6281 Muscle weakness (generalized): Secondary | ICD-10-CM | POA: Diagnosis not present

## 2022-06-04 DIAGNOSIS — M5459 Other low back pain: Secondary | ICD-10-CM | POA: Diagnosis not present

## 2022-06-04 DIAGNOSIS — Z9181 History of falling: Secondary | ICD-10-CM | POA: Diagnosis not present

## 2022-06-04 DIAGNOSIS — M25551 Pain in right hip: Secondary | ICD-10-CM | POA: Diagnosis not present

## 2022-06-04 DIAGNOSIS — R262 Difficulty in walking, not elsewhere classified: Secondary | ICD-10-CM | POA: Diagnosis not present

## 2022-06-07 DIAGNOSIS — M5459 Other low back pain: Secondary | ICD-10-CM | POA: Diagnosis not present

## 2022-06-07 DIAGNOSIS — M25551 Pain in right hip: Secondary | ICD-10-CM | POA: Diagnosis not present

## 2022-06-07 DIAGNOSIS — R262 Difficulty in walking, not elsewhere classified: Secondary | ICD-10-CM | POA: Diagnosis not present

## 2022-06-07 DIAGNOSIS — Z9181 History of falling: Secondary | ICD-10-CM | POA: Diagnosis not present

## 2022-06-07 DIAGNOSIS — M6281 Muscle weakness (generalized): Secondary | ICD-10-CM | POA: Diagnosis not present

## 2022-06-08 DIAGNOSIS — M25551 Pain in right hip: Secondary | ICD-10-CM | POA: Diagnosis not present

## 2022-06-08 DIAGNOSIS — R262 Difficulty in walking, not elsewhere classified: Secondary | ICD-10-CM | POA: Diagnosis not present

## 2022-06-08 DIAGNOSIS — M6281 Muscle weakness (generalized): Secondary | ICD-10-CM | POA: Diagnosis not present

## 2022-06-08 DIAGNOSIS — M5459 Other low back pain: Secondary | ICD-10-CM | POA: Diagnosis not present

## 2022-06-08 DIAGNOSIS — Z9181 History of falling: Secondary | ICD-10-CM | POA: Diagnosis not present

## 2022-06-10 ENCOUNTER — Inpatient Hospital Stay (HOSPITAL_COMMUNITY)
Admission: EM | Admit: 2022-06-10 | Discharge: 2022-06-17 | DRG: 535 | Disposition: A | Discharge status: Nursing Home (Includes ICF) | Payer: Medicare Other | Source: Skilled Nursing Facility | Attending: Family Medicine | Admitting: Family Medicine

## 2022-06-10 ENCOUNTER — Emergency Department (HOSPITAL_COMMUNITY): Payer: Medicare Other

## 2022-06-10 ENCOUNTER — Encounter (HOSPITAL_COMMUNITY): Payer: Self-pay

## 2022-06-10 DIAGNOSIS — M109 Gout, unspecified: Secondary | ICD-10-CM | POA: Diagnosis not present

## 2022-06-10 DIAGNOSIS — Z885 Allergy status to narcotic agent status: Secondary | ICD-10-CM | POA: Diagnosis not present

## 2022-06-10 DIAGNOSIS — J9811 Atelectasis: Secondary | ICD-10-CM | POA: Diagnosis not present

## 2022-06-10 DIAGNOSIS — I272 Pulmonary hypertension, unspecified: Secondary | ICD-10-CM

## 2022-06-10 DIAGNOSIS — I2510 Atherosclerotic heart disease of native coronary artery without angina pectoris: Secondary | ICD-10-CM | POA: Diagnosis not present

## 2022-06-10 DIAGNOSIS — Z8673 Personal history of transient ischemic attack (TIA), and cerebral infarction without residual deficits: Secondary | ICD-10-CM | POA: Diagnosis not present

## 2022-06-10 DIAGNOSIS — F32A Depression, unspecified: Secondary | ICD-10-CM | POA: Diagnosis not present

## 2022-06-10 DIAGNOSIS — H3530 Unspecified macular degeneration: Secondary | ICD-10-CM | POA: Diagnosis present

## 2022-06-10 DIAGNOSIS — N183 Chronic kidney disease, stage 3 unspecified: Secondary | ICD-10-CM | POA: Diagnosis present

## 2022-06-10 DIAGNOSIS — R9389 Abnormal findings on diagnostic imaging of other specified body structures: Secondary | ICD-10-CM

## 2022-06-10 DIAGNOSIS — Z96641 Presence of right artificial hip joint: Secondary | ICD-10-CM | POA: Diagnosis present

## 2022-06-10 DIAGNOSIS — Z880 Allergy status to penicillin: Secondary | ICD-10-CM

## 2022-06-10 DIAGNOSIS — R41841 Cognitive communication deficit: Secondary | ICD-10-CM | POA: Diagnosis not present

## 2022-06-10 DIAGNOSIS — R7989 Other specified abnormal findings of blood chemistry: Secondary | ICD-10-CM

## 2022-06-10 DIAGNOSIS — W19XXXA Unspecified fall, initial encounter: Secondary | ICD-10-CM | POA: Diagnosis not present

## 2022-06-10 DIAGNOSIS — W1830XA Fall on same level, unspecified, initial encounter: Secondary | ICD-10-CM | POA: Diagnosis present

## 2022-06-10 DIAGNOSIS — R3989 Other symptoms and signs involving the genitourinary system: Secondary | ICD-10-CM | POA: Diagnosis not present

## 2022-06-10 DIAGNOSIS — M4126 Other idiopathic scoliosis, lumbar region: Secondary | ICD-10-CM | POA: Diagnosis not present

## 2022-06-10 DIAGNOSIS — Z681 Body mass index (BMI) 19 or less, adult: Secondary | ICD-10-CM | POA: Diagnosis not present

## 2022-06-10 DIAGNOSIS — E039 Hypothyroidism, unspecified: Secondary | ICD-10-CM | POA: Diagnosis not present

## 2022-06-10 DIAGNOSIS — L259 Unspecified contact dermatitis, unspecified cause: Secondary | ICD-10-CM | POA: Diagnosis not present

## 2022-06-10 DIAGNOSIS — N1831 Chronic kidney disease, stage 3a: Secondary | ICD-10-CM | POA: Diagnosis not present

## 2022-06-10 DIAGNOSIS — S32592D Other specified fracture of left pubis, subsequent encounter for fracture with routine healing: Secondary | ICD-10-CM | POA: Diagnosis not present

## 2022-06-10 DIAGNOSIS — K5900 Constipation, unspecified: Secondary | ICD-10-CM

## 2022-06-10 DIAGNOSIS — K219 Gastro-esophageal reflux disease without esophagitis: Secondary | ICD-10-CM | POA: Diagnosis not present

## 2022-06-10 DIAGNOSIS — S52592A Other fractures of lower end of left radius, initial encounter for closed fracture: Secondary | ICD-10-CM | POA: Diagnosis present

## 2022-06-10 DIAGNOSIS — Y92002 Bathroom of unspecified non-institutional (private) residence single-family (private) house as the place of occurrence of the external cause: Secondary | ICD-10-CM | POA: Diagnosis not present

## 2022-06-10 DIAGNOSIS — Z888 Allergy status to other drugs, medicaments and biological substances status: Secondary | ICD-10-CM | POA: Diagnosis not present

## 2022-06-10 DIAGNOSIS — E222 Syndrome of inappropriate secretion of antidiuretic hormone: Secondary | ICD-10-CM | POA: Diagnosis not present

## 2022-06-10 DIAGNOSIS — S6992XA Unspecified injury of left wrist, hand and finger(s), initial encounter: Secondary | ICD-10-CM | POA: Diagnosis not present

## 2022-06-10 DIAGNOSIS — J984 Other disorders of lung: Secondary | ICD-10-CM | POA: Diagnosis not present

## 2022-06-10 DIAGNOSIS — R609 Edema, unspecified: Secondary | ICD-10-CM | POA: Diagnosis not present

## 2022-06-10 DIAGNOSIS — I10 Essential (primary) hypertension: Secondary | ICD-10-CM | POA: Diagnosis present

## 2022-06-10 DIAGNOSIS — R102 Pelvic and perineal pain: Secondary | ICD-10-CM | POA: Diagnosis not present

## 2022-06-10 DIAGNOSIS — R0902 Hypoxemia: Secondary | ICD-10-CM

## 2022-06-10 DIAGNOSIS — S52512A Displaced fracture of left radial styloid process, initial encounter for closed fracture: Secondary | ICD-10-CM | POA: Diagnosis not present

## 2022-06-10 DIAGNOSIS — E871 Hypo-osmolality and hyponatremia: Secondary | ICD-10-CM | POA: Diagnosis present

## 2022-06-10 DIAGNOSIS — R262 Difficulty in walking, not elsewhere classified: Secondary | ICD-10-CM | POA: Diagnosis not present

## 2022-06-10 DIAGNOSIS — M25532 Pain in left wrist: Secondary | ICD-10-CM | POA: Diagnosis not present

## 2022-06-10 DIAGNOSIS — E43 Unspecified severe protein-calorie malnutrition: Secondary | ICD-10-CM | POA: Diagnosis not present

## 2022-06-10 DIAGNOSIS — Z86718 Personal history of other venous thrombosis and embolism: Secondary | ICD-10-CM

## 2022-06-10 DIAGNOSIS — S199XXA Unspecified injury of neck, initial encounter: Secondary | ICD-10-CM | POA: Diagnosis not present

## 2022-06-10 DIAGNOSIS — J811 Chronic pulmonary edema: Secondary | ICD-10-CM | POA: Diagnosis not present

## 2022-06-10 DIAGNOSIS — R197 Diarrhea, unspecified: Secondary | ICD-10-CM | POA: Diagnosis not present

## 2022-06-10 DIAGNOSIS — I5032 Chronic diastolic (congestive) heart failure: Secondary | ICD-10-CM | POA: Diagnosis present

## 2022-06-10 DIAGNOSIS — I440 Atrioventricular block, first degree: Secondary | ICD-10-CM | POA: Diagnosis present

## 2022-06-10 DIAGNOSIS — R339 Retention of urine, unspecified: Secondary | ICD-10-CM | POA: Diagnosis present

## 2022-06-10 DIAGNOSIS — Z66 Do not resuscitate: Secondary | ICD-10-CM

## 2022-06-10 DIAGNOSIS — R338 Other retention of urine: Secondary | ICD-10-CM

## 2022-06-10 DIAGNOSIS — I480 Paroxysmal atrial fibrillation: Secondary | ICD-10-CM

## 2022-06-10 DIAGNOSIS — Z743 Need for continuous supervision: Secondary | ICD-10-CM | POA: Diagnosis not present

## 2022-06-10 DIAGNOSIS — J90 Pleural effusion, not elsewhere classified: Secondary | ICD-10-CM | POA: Diagnosis not present

## 2022-06-10 DIAGNOSIS — R110 Nausea: Secondary | ICD-10-CM | POA: Diagnosis not present

## 2022-06-10 DIAGNOSIS — S32592A Other specified fracture of left pubis, initial encounter for closed fracture: Secondary | ICD-10-CM | POA: Diagnosis not present

## 2022-06-10 DIAGNOSIS — M8588 Other specified disorders of bone density and structure, other site: Secondary | ICD-10-CM | POA: Diagnosis not present

## 2022-06-10 DIAGNOSIS — Z823 Family history of stroke: Secondary | ICD-10-CM

## 2022-06-10 DIAGNOSIS — S52502A Unspecified fracture of the lower end of left radius, initial encounter for closed fracture: Secondary | ICD-10-CM | POA: Diagnosis not present

## 2022-06-10 DIAGNOSIS — S52502D Unspecified fracture of the lower end of left radius, subsequent encounter for closed fracture with routine healing: Secondary | ICD-10-CM | POA: Diagnosis not present

## 2022-06-10 DIAGNOSIS — I130 Hypertensive heart and chronic kidney disease with heart failure and stage 1 through stage 4 chronic kidney disease, or unspecified chronic kidney disease: Secondary | ICD-10-CM | POA: Diagnosis not present

## 2022-06-10 DIAGNOSIS — Z79899 Other long term (current) drug therapy: Secondary | ICD-10-CM

## 2022-06-10 DIAGNOSIS — R531 Weakness: Secondary | ICD-10-CM | POA: Diagnosis not present

## 2022-06-10 DIAGNOSIS — M6281 Muscle weakness (generalized): Secondary | ICD-10-CM | POA: Diagnosis not present

## 2022-06-10 DIAGNOSIS — R1314 Dysphagia, pharyngoesophageal phase: Secondary | ICD-10-CM | POA: Diagnosis not present

## 2022-06-10 DIAGNOSIS — Z951 Presence of aortocoronary bypass graft: Secondary | ICD-10-CM

## 2022-06-10 DIAGNOSIS — R079 Chest pain, unspecified: Secondary | ICD-10-CM | POA: Diagnosis not present

## 2022-06-10 DIAGNOSIS — K900 Celiac disease: Secondary | ICD-10-CM | POA: Diagnosis not present

## 2022-06-10 DIAGNOSIS — J439 Emphysema, unspecified: Secondary | ICD-10-CM | POA: Diagnosis not present

## 2022-06-10 DIAGNOSIS — I340 Nonrheumatic mitral (valve) insufficiency: Secondary | ICD-10-CM | POA: Diagnosis present

## 2022-06-10 DIAGNOSIS — Z1152 Encounter for screening for COVID-19: Secondary | ICD-10-CM | POA: Diagnosis not present

## 2022-06-10 DIAGNOSIS — Z87440 Personal history of urinary (tract) infections: Secondary | ICD-10-CM | POA: Diagnosis not present

## 2022-06-10 DIAGNOSIS — S0990XA Unspecified injury of head, initial encounter: Secondary | ICD-10-CM | POA: Diagnosis not present

## 2022-06-10 DIAGNOSIS — Z7901 Long term (current) use of anticoagulants: Secondary | ICD-10-CM

## 2022-06-10 DIAGNOSIS — Z79890 Hormone replacement therapy (postmenopausal): Secondary | ICD-10-CM

## 2022-06-10 DIAGNOSIS — M25552 Pain in left hip: Secondary | ICD-10-CM | POA: Diagnosis not present

## 2022-06-10 DIAGNOSIS — M810 Age-related osteoporosis without current pathological fracture: Secondary | ICD-10-CM | POA: Diagnosis present

## 2022-06-10 DIAGNOSIS — Z8249 Family history of ischemic heart disease and other diseases of the circulatory system: Secondary | ICD-10-CM

## 2022-06-10 DIAGNOSIS — M545 Low back pain, unspecified: Secondary | ICD-10-CM | POA: Diagnosis not present

## 2022-06-10 LAB — COMPREHENSIVE METABOLIC PANEL
ALT: 30 U/L (ref 0–44)
AST: 48 U/L — ABNORMAL HIGH (ref 15–41)
Albumin: 3.4 g/dL — ABNORMAL LOW (ref 3.5–5.0)
Alkaline Phosphatase: 74 U/L (ref 38–126)
Anion gap: 7 (ref 5–15)
BUN: 8 mg/dL (ref 8–23)
CO2: 21 mmol/L — ABNORMAL LOW (ref 22–32)
Calcium: 8.7 mg/dL — ABNORMAL LOW (ref 8.9–10.3)
Chloride: 103 mmol/L (ref 98–111)
Creatinine, Ser: 0.74 mg/dL (ref 0.44–1.00)
GFR, Estimated: >60 mL/min (ref >60)
Glucose, Bld: 106 mg/dL — ABNORMAL HIGH (ref 70–99)
Potassium: 3.8 mmol/L (ref 3.5–5.1)
Sodium: 131 mmol/L — ABNORMAL LOW (ref 135–145)
Total Bilirubin: 0.8 mg/dL (ref 0.3–1.2)
Total Protein: 6.8 g/dL (ref 6.5–8.1)

## 2022-06-10 LAB — CBC WITH DIFFERENTIAL/PLATELET
Abs Immature Granulocytes: 0.04 10*3/uL (ref 0.00–0.07)
Basophils Absolute: 0 10*3/uL (ref 0.0–0.1)
Basophils Relative: 0 %
Eosinophils Absolute: 0 10*3/uL (ref 0.0–0.5)
Eosinophils Relative: 0 %
HCT: 35.6 % — ABNORMAL LOW (ref 36.0–46.0)
Hemoglobin: 12 g/dL (ref 12.0–15.0)
Immature Granulocytes: 0 %
Lymphocytes Relative: 2 %
Lymphs Abs: 0.3 10*3/uL — ABNORMAL LOW (ref 0.7–4.0)
MCH: 31.1 pg (ref 26.0–34.0)
MCHC: 33.7 g/dL (ref 30.0–36.0)
MCV: 92.2 fL (ref 80.0–100.0)
Monocytes Absolute: 0.4 10*3/uL (ref 0.1–1.0)
Monocytes Relative: 3 %
Neutro Abs: 13.3 10*3/uL — ABNORMAL HIGH (ref 1.7–7.7)
Neutrophils Relative %: 95 %
Platelets: 235 10*3/uL (ref 150–400)
RBC: 3.86 MIL/uL — ABNORMAL LOW (ref 3.87–5.11)
RDW: 14.9 % (ref 11.5–15.5)
WBC: 14 10*3/uL — ABNORMAL HIGH (ref 4.0–10.5)
nRBC: 0 % (ref 0.0–0.2)

## 2022-06-10 LAB — I-STAT CHEM 8, ED
BUN: 9 mg/dL (ref 8–23)
Calcium, Ion: 0.99 mmol/L — ABNORMAL LOW (ref 1.15–1.40)
Chloride: 101 mmol/L (ref 98–111)
Creatinine, Ser: 0.5 mg/dL (ref 0.44–1.00)
Glucose, Bld: 102 mg/dL — ABNORMAL HIGH (ref 70–99)
HCT: 37 % (ref 36.0–46.0)
Hemoglobin: 12.6 g/dL (ref 12.0–15.0)
Potassium: 3.9 mmol/L (ref 3.5–5.1)
Sodium: 131 mmol/L — ABNORMAL LOW (ref 135–145)
TCO2: 19 mmol/L — ABNORMAL LOW (ref 22–32)

## 2022-06-10 LAB — TYPE AND SCREEN
ABO/RH(D): O POS
Antibody Screen: NEGATIVE

## 2022-06-10 LAB — CK: Total CK: 210 U/L (ref 38–234)

## 2022-06-10 LAB — PROTIME-INR
INR: 1.3 — ABNORMAL HIGH (ref 0.8–1.2)
Prothrombin Time: 15.9 seconds — ABNORMAL HIGH (ref 11.4–15.2)

## 2022-06-10 MED ORDER — POLYVINYL ALCOHOL 1.4 % OP SOLN
1.0000 [drp] | Freq: Every day | OPHTHALMIC | Status: DC
  Administered 2022-06-12 – 2022-06-16 (×5): 1 [drp] via OPHTHALMIC
  Filled 2022-06-10 (×2): qty 15

## 2022-06-10 MED ORDER — FUROSEMIDE 40 MG PO TABS
40.0000 mg | ORAL_TABLET | ORAL | Status: DC
  Filled 2022-06-10: qty 1

## 2022-06-10 MED ORDER — MORPHINE SULFATE (PF) 2 MG/ML IV SOLN: 0.5000 mg | INTRAVENOUS | Status: DC | PRN

## 2022-06-10 MED ORDER — MAGNESIUM HYDROXIDE 400 MG/5ML PO SUSP: 15.0000 mL | Freq: Every evening | ORAL | Status: DC | PRN

## 2022-06-10 MED ORDER — HYDROCODONE-ACETAMINOPHEN 5-325 MG PO TABS
1.0000 | ORAL_TABLET | Freq: Four times a day (QID) | ORAL | Status: DC | PRN
  Administered 2022-06-10 – 2022-06-12 (×4): 1 via ORAL
  Filled 2022-06-10 (×4): qty 1

## 2022-06-10 MED ORDER — AMIODARONE HCL 200 MG PO TABS
200.0000 mg | ORAL_TABLET | Freq: Every day | ORAL | Status: DC
  Administered 2022-06-10 – 2022-06-17 (×7): 200 mg via ORAL
  Filled 2022-06-10 (×8): qty 1

## 2022-06-10 MED ORDER — MELATONIN 5 MG PO TABS
5.0000 mg | ORAL_TABLET | Freq: Every day | ORAL | Status: DC
  Administered 2022-06-10 – 2022-06-16 (×7): 5 mg via ORAL
  Filled 2022-06-10 (×7): qty 1

## 2022-06-10 MED ORDER — FUROSEMIDE 40 MG PO TABS
80.0000 mg | ORAL_TABLET | ORAL | Status: DC
  Administered 2022-06-11: 80 mg via ORAL
  Filled 2022-06-10: qty 2

## 2022-06-10 MED ORDER — DILTIAZEM HCL ER COATED BEADS 120 MG PO CP24
120.0000 mg | ORAL_CAPSULE | Freq: Every day | ORAL | Status: DC
  Administered 2022-06-11 – 2022-06-17 (×7): 120 mg via ORAL
  Filled 2022-06-10 (×7): qty 1

## 2022-06-10 MED ORDER — ONDANSETRON HCL 4 MG/2ML IJ SOLN
4.0000 mg | Freq: Once | INTRAMUSCULAR | Status: AC
  Administered 2022-06-10: 4 mg via INTRAVENOUS
  Filled 2022-06-10: qty 2

## 2022-06-10 MED ORDER — HYDROMORPHONE HCL 1 MG/ML IJ SOLN
0.5000 mg | Freq: Once | INTRAMUSCULAR | Status: AC
  Administered 2022-06-10: 0.5 mg via INTRAVENOUS
  Filled 2022-06-10: qty 1

## 2022-06-10 MED ORDER — FUROSEMIDE 20 MG PO TABS
40.0000 mg | ORAL_TABLET | Freq: Every day | ORAL | Status: DC
Start: 2022-06-11 — End: 2022-06-10

## 2022-06-10 MED ORDER — SERTRALINE HCL 50 MG PO TABS
50.0000 mg | ORAL_TABLET | Freq: Every morning | ORAL | Status: DC
  Administered 2022-06-11 – 2022-06-17 (×7): 50 mg via ORAL
  Filled 2022-06-10 (×7): qty 1

## 2022-06-10 MED ORDER — LEVOTHYROXINE SODIUM 25 MCG PO CAPS: 1.0000 | ORAL_CAPSULE | Freq: Every day | ORAL | Status: DC

## 2022-06-10 MED ORDER — MORPHINE SULFATE (PF) 2 MG/ML IV SOLN
1.0000 mg | Freq: Once | INTRAVENOUS | Status: AC
  Administered 2022-06-10: 1 mg via INTRAVENOUS
  Filled 2022-06-10: qty 1

## 2022-06-10 MED ORDER — POLYETHYL GLYCOL-PROPYL GLYCOL 0.4-0.3 % OP SOLN: 1.0000 [drp] | Freq: Every day | OPHTHALMIC | Status: DC

## 2022-06-10 MED ORDER — APIXABAN 2.5 MG PO TABS
2.5000 mg | ORAL_TABLET | Freq: Two times a day (BID) | ORAL | Status: DC
  Administered 2022-06-10 – 2022-06-17 (×14): 2.5 mg via ORAL
  Filled 2022-06-10 (×15): qty 1

## 2022-06-10 NOTE — H&P (Signed)
History and Physical    Patient: Maria [name] GLO:[id number] DOB: Jul 29, 1924 DOA: 06/10/2022 DOS: the patient was seen and examined on 06/10/2022 PCP: [name], MD  Patient coming from: [facility]  Chief Complaint:  Chief Complaint  Patient presents with   Fall   HPI: Maria [name] is a 88 y.o. female with medical history significant of PAF, stroke, on chronic eliquis, HTN, dCHF.  Pt presents to ED after a mechanical fall at home.  Occurred around 6AM when she got up to use bathroom.  Pt unable to ambulate post fall due to pain.  Pt with pain in L wrist, back, and L hip.  Found at ~1pm when therapist came to see patient, knocked on door and no answer.    Review of Systems: As mentioned in the history of present illness. All other systems reviewed and are negative. Past Medical History:  Diagnosis Date   Anemia    Atonic bladder 12/11/2013   Carotid artery occlusion    right carotid stenosis 40-60%, 4/08, neg for stenosis repeat study 11/11   Celiac disease    Chronic anticoagulation CHADS2VASC2 score 7 10/18/2014   Chronic diastolic CHF (congestive heart failure) (HCC)    takes Lasix daily   CKD (chronic kidney disease), stage III ([place])    Coronary artery disease    a.  08/2013 (LIMA-LAD, rSVG to LCX and Lt atrial clip),   Depression    DNR (do not resuscitate) 03/28/2021   DVT (deep venous thrombosis) ([place]) 09/2013   Gastric ulcer    6-83month ago   GERD (gastroesophageal reflux disease)    takes Omeprazole daily   GI bleeding    Hemorrhoids    Hot flashes    takes ZOloft 3 times a week   Hypertension    Hypothyroidism    takes Synthroid daily as result of AMiodarone   Insomnia    Macular degeneration    Mild aortic insufficiency    a. Echo 10/2013: mild AI.   Moderate mitral regurgitation by prior echocardiogram    a. Echo 10/2013.   Moderate obstructive sleep apnea    uses CPAP;sleep study about 4-532monthago   MVP (mitral valve  prolapse)    Osteoporosis    PAF (paroxysmal atrial fibrillation) (HCC)    Peripheral edema    left   Presbycusis    Pulmonary hypertension (HCC)    RLS (restless legs syndrome)    Thyroid nodule    64m564mno change 11/11   Urethral stricture    Urinary anastomotic stricture    Urinary retention    Vitamin D deficiency    Past Surgical History:  Procedure Laterality Date   APPENDECTOMY     bilateral cataract surgery     CARDIAC CATHETERIZATION  08-06-13   CORONARY ARTERY BYPASS GRAFT N/A 08/28/2013   Procedure: CORONARY ARTERY BYPASS GRAFTING (CABG) x2 using right greater saphenous vein and left internal mammary artery. ;  Surgeon: EdwGrace IsaacD;  [facility];  Laterality: N/A;   HIP ARTHROPLASTY Right 12/09/2013   Procedure: ARTHROPLASTY BIPOLAR HIP CEMENTED;  Surgeon: FraKerin SalenD;  [facility] [place];  Laterality: Right;   HIP PINNING,CANNULATED Left 03/30/2021   Procedure: CANNULATED HIP PINNING;  Surgeon: LanMarchia BondD;  [facility] [place];  Laterality: Left;   INTRAOPERATIVE TRANSESOPHAGEAL ECHOCARDIOGRAM N/A 08/28/2013   Procedure: INTRAOPERATIVE TRANSESOPHAGEAL ECHOCARDIOGRAM;  Surgeon: EdwGrace IsaacD;  Location:  [facility];  Service: Open Heart Surgery;  Laterality: N/A;   LEFT AND RIGHT HEART CATHETERIZATION WITH CORONARY ANGIOGRAM N/A 08/16/2013   Procedure: LEFT AND RIGHT HEART CATHETERIZATION WITH CORONARY ANGIOGRAM;  Surgeon: [name], MD;  Location: [facility] CATH LAB;  Service: Cardiovascular;  Laterality: N/A;   MANDIBLE SURGERY     TCS     TONSILLECTOMY     trigger thumb     TUBAL LIGATION     Social History:  reports that she has never smoked. She has never used smokeless tobacco. She reports that she does not drink alcohol and does not use drugs.  Allergies  Allergen Reactions   Fentanyl Nausea And Vomiting    Severe n/v   Amoxicillin Nausea Only and Other (See  Comments)    dizziness   Atorvastatin Other (See Comments)    Unknown reaction - reported by Sadie Haber   Bupropion Other (See Comments)    nightmares   Codeine Nausea And Vomiting   Fosamax [Alendronate Sodium] Other (See Comments)    Jaw pain   Gabapentin Other (See Comments)    dizziness   Gluten Meal Other (See Comments)    Celiac Disease    Hydrochlorothiazide Other (See Comments)    Hyponatremia, GI upset    Tetanus Toxoid, Adsorbed Swelling    Severe swelling at site of injection (pt tolerates 1/2 injection then 1/2 week later)    Family History  Problem Relation Age of Onset   Heart attack Mother    CVA Mother    CAD Father    Colon cancer Father    Heart attack Father    Heart attack Brother    Peripheral vascular disease Brother    AAA (abdominal aortic aneurysm) Brother     Prior to Admission medications   Medication Sig Start Date End Date Taking? Authorizing Provider  acetaminophen (TYLENOL) 500 MG tablet Take 1,000 mg by mouth every 6 (six) hours as needed (pain).   Yes [provider]  amiodarone (PACERONE) 200 MG tablet TAKE 1 TABLET BY MOUTH DAILY Patient taking differently: Take 200 mg by mouth at bedtime. 04/20/22  Yes Belva Crome, MD  apixaban (ELIQUIS) 2.5 MG TABS tablet Take 1 tablet (2.5 mg total) by mouth 2 (two) times daily. 12/14/21 06/10/22 Yes Belva Crome, MD  cholecalciferol (VITAMIN D3) 10 MCG (400 UNIT) TABS tablet Take 1 tablet (400 Units total) by mouth daily. 04/16/21  Yes Love, Ivan Anchors, PA-C  diltiazem (CARDIZEM CD) 120 MG 24 hr capsule Take 1 capsule (120 mg total) by mouth daily. 12/14/21 06/10/22 Yes Belva Crome, MD  furosemide (LASIX) 40 MG tablet Take 80 mg by mouth every other day, alternate with 40 mg by mouth every other day. 12/14/21  Yes Belva Crome, MD  Levothyroxine Sodium 25 MCG CAPS Take 1 capsule by mouth daily before breakfast.   Yes [provider]  magnesium hydroxide (MILK OF MAGNESIA) 400 MG/5ML  suspension Take 15 mLs by mouth at bedtime as needed for mild constipation.   Yes [provider]  melatonin 5 MG TABS Take 1-2 tablets (5-10 mg total) by mouth at bedtime. 04/15/21  Yes Love, Ivan Anchors, PA-C  Menthol, Topical Analgesic, (ICY HOT EX) Apply 1 application topically daily as needed (pain).   Yes [provider]  Nutritional Supplements (BOOST PO) Take by mouth.   Yes [provider]  Polyethyl Glycol-Propyl Glycol (SYSTANE) 0.4-0.3 % SOLN Place 1 drop into both eyes at  bedtime.   Yes [provider]  sertraline (ZOLOFT) 50 MG tablet Take 50 mg by mouth every morning. 03/12/21  Yes [provider]    Physical Exam: Vitals:   06/10/22 1700 06/10/22 1730 06/10/22 1830 06/10/22 1915  BP: 114/61 133/65 (!) 131/52 (!) 126/55  Pulse: 75 77 70 77  Resp: (!) 22 12 (!) 22 20  Temp:      TempSrc:      SpO2: 98% 98% 99% 99%  Weight:      Height:       Constitutional: NAD, calm, comfortable Eyes: PERRL, lids and conjunctivae normal ENMT: Mucous membranes are moist. Posterior pharynx clear of any exudate or lesions.Normal dentition.  Neck: normal, supple, no masses, no thyromegaly Respiratory: clear to auscultation bilaterally, no wheezing, no crackles. Normal respiratory effort. No accessory muscle use.  Cardiovascular: Regular rate and rhythm, no murmurs / rubs / gallops. No extremity edema. 2+ pedal pulses. No carotid bruits.  Abdomen: no tenderness, no masses palpated. No hepatosplenomegaly. Bowel sounds positive.  Musculoskeletal: L hip TTP, L arm in sling Skin: no rashes, lesions, ulcers. No induration Neurologic: CN 2-12 grossly intact. Sensation intact, DTR normal. Strength 5/5 in all 4.  Psychiatric: Normal judgment and insight. Alert and oriented x 3. Normal mood.   Data Reviewed:    Non displaced distal L radius fx  L pubic rami fractures.  Assessment and Plan: * Fracture of multiple pubic rami, left, closed, initial  encounter ([place]) Per Dr. Griffin Basil: 1) WBAT LLE 2) will see pt in AM  Presumably will be non-op as these usually are Therefore will let pt eat Continue eliquis Pain control with PRN norco Morphine PRN breakthrough pain PT/OT SW consult -> presumably will need to go to SNF at her facility (pt lives at a continuing care facility, onsite SNF)  Fracture of distal end of left radius Wrist splint.  Pulmonary hypertension, moderate to severe (HCC) Continue home Lasix  Paroxysmal atrial fibrillation (HCC) Cont cardizem, amiodarone. Cont eliquis for now (though pt is quite clearly a high fall risk chronically at this point).  Defer the discussion about risks/benefits of longer term eliquis in a fall risk patient to PCP + Cardiology.      Advance Care Planning:   Code Status: DNR   Consults: Dr. Griffin Basil  Family Communication: No family in room  Severity of Illness: The appropriate patient status for this patient is OBSERVATION. Observation status is judged to be reasonable and necessary in order to provide the required intensity of service to ensure the patient's safety. The patient's presenting symptoms, physical exam findings, and initial radiographic and laboratory data in the context of their medical condition is felt to place them at decreased risk for further clinical deterioration. Furthermore, it is anticipated that the patient will be medically stable for discharge from the hospital within 2 midnights of admission.   Author: Etta Quill., DO 06/10/2022 8:29 PM  For on call review [email or web address].

## 2022-06-10 NOTE — Progress Notes (Signed)
Orthopedic Tech Progress Note Patient Details:  Maria [name] 11-24-1923 [id number]  Level 2 trauma, ortho tech services not required yet.  Patient ID: Maria [name], female   DOB: 05-30-1924, 87 y.o.   MRN: [id number]  Maria [name] 06/10/2022, 4:44 PM

## 2022-06-10 NOTE — Assessment & Plan Note (Addendum)
Hemoglobin normal and stable. -Continue home Cardizem, amiodarone and Eliquis

## 2022-06-10 NOTE — Assessment & Plan Note (Addendum)
Plain films pelvis with minimally displaced fracture of the superior pubic ramus on the L and possible nondisplaced fracture of the L inferior pubic ramus  Orthopedic surgery consulted. Recommendations for weight bearing as tolerated. PT/OT consulted with recommendations for SNF -Continue Tylenol as needed (hold tramadol with hyponatremia, she hasn't used anything for pain in Sharlie Shreffler few days, follow) -Mobility as tolerated

## 2022-06-10 NOTE — ED Triage Notes (Addendum)
Pt BIB [facility] for injuries from a fall. They report pt fell at 6am and has been lying in the floor all day. Pt reports injury to L wrist, L hip, and whole back. Pt is A/Ox4 on arrival. EMS has C-collar and SAM splint to L wrist.   During triage we noted pt is on blood thinners and pt reports she did hit her head. Level 2 trauma activated at this time.

## 2022-06-10 NOTE — Assessment & Plan Note (Addendum)
-  Continue home Lasix

## 2022-06-10 NOTE — Assessment & Plan Note (Addendum)
Plain films with acute fx of distal L radius at the base of the radial styloid that demonstrates no significant displacement Currently with wrist splint. Nonoperative management. Left hand swelling is improved from yesterday. No evidence of hand fracture on previous x-ray. Per orthopedic surgery, non-weightbearing of left wrist.

## 2022-06-10 NOTE — ED Provider Notes (Signed)
[facility] EMERGENCY DEPARTMENT Provider Note   CSN: [id number] Arrival date & time: 06/10/22  1523     History  Chief Complaint  Patient presents with   Lytle Michaels    RISHIKA MCCOLLOM is a 88 y.o. female.  88 year old female brought in by EMS from [facility] for evaluation after a fall.  Patient is on Eliquis.  States that she went to go to the bathroom around 6 AM today when her rollator got away from her and she fell injuring her left hip and left wrist.  States that she did hit her head, no loss of consciousness.  Patient lives alone, was unable to get up to call for help.  Patient had a therapist arrived to the house at 1:00 and when she did not answer the door this prompted facility to open her room where they found her laying on the floor.  No other injuries or concerns. Past medical history of osteoporosis, celiac disease, CAD, macular degeneration, hypertension, paroxysmal A-fib (on Eliquis), DVT, CHF.       Home Medications Prior to Admission medications   Medication Sig Start Date End Date Taking? Authorizing Provider  acetaminophen (TYLENOL) 500 MG tablet Take 1,000 mg by mouth every 6 (six) hours as needed (pain).    [provider]  amiodarone (PACERONE) 200 MG tablet TAKE 1 TABLET BY MOUTH DAILY 04/20/22   Belva Crome, MD  apixaban (ELIQUIS) 2.5 MG TABS tablet Take 1 tablet (2.5 mg total) by mouth 2 (two) times daily. 12/14/21 01/13/22  Belva Crome, MD  cholecalciferol (VITAMIN D3) 10 MCG (400 UNIT) TABS tablet Take 1 tablet (400 Units total) by mouth daily. 04/16/21   Love, Ivan Anchors, PA-C  diltiazem (CARDIZEM CD) 120 MG 24 hr capsule Take 1 capsule (120 mg total) by mouth daily. 12/14/21 03/14/22  Belva Crome, MD  famotidine (PEPCID) 10 MG tablet Take 1 tablet (10 mg total) by mouth 2 (two) times daily. Patient not taking: Reported on 01/14/2022 04/15/21   Bary Leriche, PA-C  feeding supplement (ENSURE ENLIVE / ENSURE PLUS) LIQD Take 237  mLs by mouth 2 (two) times daily between meals. 04/02/21   Mariel Aloe, MD  ferrous sulfate 325 (65 FE) MG tablet Take 1 tablet (325 mg total) by mouth daily with breakfast. Patient not taking: Reported on 01/14/2022 04/03/21   Mariel Aloe, MD  furosemide (LASIX) 40 MG tablet Take 80 mg by mouth every other day, alternate with 40 mg by mouth every other day. 12/14/21   Belva Crome, MD  Levothyroxine Sodium 25 MCG CAPS Take 1 capsule by mouth daily before breakfast.    [provider]  magnesium hydroxide (MILK OF MAGNESIA) 400 MG/5ML suspension Take 15 mLs by mouth at bedtime as needed for mild constipation.    [provider]  melatonin 5 MG TABS Take 1-2 tablets (5-10 mg total) by mouth at bedtime. 04/15/21   Love, Ivan Anchors, PA-C  Menthol, Topical Analgesic, (ICY HOT EX) Apply 1 application topically daily as needed (pain).    [provider]  Multiple Vitamin (MULTIVITAMIN WITH MINERALS) TABS tablet Take 1 tablet by mouth daily. 04/03/21   Mariel Aloe, MD  Nutritional Supplements (BOOST PO) Take by mouth.    [provider]  Polyethyl Glycol-Propyl Glycol (SYSTANE) 0.4-0.3 % SOLN Place 1 drop into both eyes at bedtime.    [provider]  polyethylene glycol (MIRALAX / GLYCOLAX) 17 g packet Take 17  g by mouth daily as needed for moderate constipation or mild constipation. 04/02/21   Mariel Aloe, MD  senna-docusate (SENOKOT-S) 8.6-50 MG tablet Take 1 tablet by mouth daily. 04/16/21   Love, Ivan Anchors, PA-C  sertraline (ZOLOFT) 50 MG tablet Take 50 mg by mouth every morning. 03/12/21   [provider]  traMADol (ULTRAM) 50 MG tablet Take 2 tablets (100 mg total) by mouth 3 (three) times daily. Patient not taking: Reported on 01/14/2022 04/15/21   Bary Leriche, PA-C      Allergies    Fentanyl; Amoxicillin; Atorvastatin; Bupropion; Codeine; Fosamax [alendronate sodium]; Gabapentin; Gluten meal; Hydrochlorothiazide; and Tetanus toxoid,  adsorbed    Review of Systems   Review of Systems Negative except as per HPI Physical Exam Updated Vital Signs BP (!) 126/55   Pulse 77   Temp 98.7 F (37.1 C) (Oral)   Resp 20   Ht 5' (1.524 m)   Wt 41.7 kg   SpO2 99%   BMI 17.97 kg/m  Physical Exam Vitals and nursing note reviewed.  Constitutional:      General: She is not in acute distress.    Appearance: She is well-developed. She is not diaphoretic.     Interventions: Cervical collar in place.     Comments: SAM splint left arm  HENT:     Head: Normocephalic and atraumatic.  Cardiovascular:     Rate and Rhythm: Normal rate and regular rhythm.     Pulses: Normal pulses.     Heart sounds: Normal heart sounds.  Pulmonary:     Effort: Pulmonary effort is normal.     Breath sounds: Normal breath sounds.  Abdominal:     Palpations: Abdomen is soft.     Tenderness: There is no abdominal tenderness.  Musculoskeletal:        General: Swelling, tenderness and signs of injury present.     Cervical back: No bony tenderness.     Thoracic back: No bony tenderness.     Lumbar back: No bony tenderness.     Right lower leg: No edema.     Left lower leg: No edema.     Comments: Swelling, tenderness left wrist. Left hip pain with abduction, not shortened or rotated.   Skin:    General: Skin is warm and dry.     Coloration: Skin is not jaundiced.     Findings: No erythema or rash.  Neurological:     Mental Status: She is alert and oriented to person, place, and time.     Sensory: No sensory deficit.  Psychiatric:        Behavior: Behavior normal.     ED Results / Procedures / Treatments   Labs (all labs ordered are listed, but only abnormal results are displayed) Labs Reviewed  COMPREHENSIVE METABOLIC PANEL - Abnormal; Notable for the following components:      Result Value   Sodium 131 (*)    CO2 21 (*)    Glucose, Bld 106 (*)    Calcium 8.7 (*)    Albumin 3.4 (*)    AST 48 (*)    All other components within  normal limits  CBC WITH DIFFERENTIAL/PLATELET - Abnormal; Notable for the following components:   WBC 14.0 (*)    RBC 3.86 (*)    HCT 35.6 (*)    Neutro Abs 13.3 (*)    Lymphs Abs 0.3 (*)    All other components within normal limits  PROTIME-INR - Abnormal; Notable for  the following components:   Prothrombin Time 15.9 (*)    INR 1.3 (*)    All other components within normal limits  I-STAT CHEM 8, ED - Abnormal; Notable for the following components:   Sodium 131 (*)    Glucose, Bld 102 (*)    Calcium, Ion 0.99 (*)    TCO2 19 (*)    All other components within normal limits  CK  URINALYSIS, ROUTINE W REFLEX MICROSCOPIC  TYPE AND SCREEN    EKG EKG Interpretation  Date/Time:  Thursday June 10 2022 16:20:26 EDT Ventricular Rate:  72 PR Interval:    QRS Duration: 162 QT Interval:  458 QTC Calculation: 501 R Axis:   -72 Text Interpretation: Atrial fibrillation Right bundle branch block Left anterior fascicular block Minimal voltage criteria for LVH, may be normal variant ( R in aVL ) Possible Lateral infarct , age undetermined Abnormal ECG Confirmed by Dorie Rank [phone]) on 06/10/2022 4:24:51 PM  Radiology CT Head Wo Contrast  Result Date: 06/10/2022 CLINICAL DATA:  injuries from a fall. They report pt fell at 6am and has been lying in the floor all day. Pt reports injury to L wrist, L hip, and whole back. Pt is A/Ox4 on arrival. EMS has C-collar and SAM splint to L wrist. EXAM: CT HEAD WITHOUT CONTRAST CT CERVICAL SPINE WITHOUT CONTRAST TECHNIQUE: Multidetector CT imaging of the head and cervical spine was performed following the standard protocol without intravenous contrast. Multiplanar CT image reconstructions of the cervical spine were also generated. RADIATION DOSE REDUCTION: This exam was performed according to the departmental dose-optimization program which includes automated exposure control, adjustment of the mA and/or kV according to patient size and/or use of iterative  reconstruction technique. COMPARISON:  None Available. FINDINGS: CT HEAD FINDINGS BRAIN: BRAIN Cerebral ventricle sizes are concordant with the degree of cerebral volume loss. Patchy and confluent areas of decreased attenuation are noted throughout the deep and periventricular white matter of the cerebral hemispheres bilaterally, compatible with chronic microvascular ischemic disease. No evidence of large-territorial acute infarction. No parenchymal hemorrhage. No mass lesion. No extra-axial collection. No mass effect or midline shift. No hydrocephalus. Basilar cisterns are patent. Vascular: No hyperdense vessel. Skull: No acute fracture or focal lesion. Sinuses/Orbits: Paranasal sinuses and mastoid air cells are clear. Bilateral lens replacement. Otherwise the orbits are unremarkable. Other: None. CT CERVICAL SPINE FINDINGS Alignment: Normal. Skull base and vertebrae: Multilevel moderate degenerative changes of the spine. Associated multilevel moderate to severe degenerative changes spine. No acute fracture. No aggressive appearing focal osseous lesion or focal pathologic process. Soft tissues and spinal canal: No prevertebral fluid or swelling. No visible canal hematoma. Upper chest: Biapical pleural/pulmonary scarring. Other: None. IMPRESSION: 1. No acute intracranial abnormality. 2. No acute displaced fracture or traumatic listhesis of the cervical spine. Electronically Signed   By: Iven Finn M.D.   On: 06/10/2022 17:36   CT Cervical Spine Wo Contrast  Result Date: 06/10/2022 CLINICAL DATA:  injuries from a fall. They report pt fell at 6am and has been lying in the floor all day. Pt reports injury to L wrist, L hip, and whole back. Pt is A/Ox4 on arrival. EMS has C-collar and SAM splint to L wrist. EXAM: CT HEAD WITHOUT CONTRAST CT CERVICAL SPINE WITHOUT CONTRAST TECHNIQUE: Multidetector CT imaging of the head and cervical spine was performed following the standard protocol without intravenous  contrast. Multiplanar CT image reconstructions of the cervical spine were also generated. RADIATION DOSE REDUCTION: This exam was performed according  to the departmental dose-optimization program which includes automated exposure control, adjustment of the mA and/or kV according to patient size and/or use of iterative reconstruction technique. COMPARISON:  None Available. FINDINGS: CT HEAD FINDINGS BRAIN: BRAIN Cerebral ventricle sizes are concordant with the degree of cerebral volume loss. Patchy and confluent areas of decreased attenuation are noted throughout the deep and periventricular white matter of the cerebral hemispheres bilaterally, compatible with chronic microvascular ischemic disease. No evidence of large-territorial acute infarction. No parenchymal hemorrhage. No mass lesion. No extra-axial collection. No mass effect or midline shift. No hydrocephalus. Basilar cisterns are patent. Vascular: No hyperdense vessel. Skull: No acute fracture or focal lesion. Sinuses/Orbits: Paranasal sinuses and mastoid air cells are clear. Bilateral lens replacement. Otherwise the orbits are unremarkable. Other: None. CT CERVICAL SPINE FINDINGS Alignment: Normal. Skull base and vertebrae: Multilevel moderate degenerative changes of the spine. Associated multilevel moderate to severe degenerative changes spine. No acute fracture. No aggressive appearing focal osseous lesion or focal pathologic process. Soft tissues and spinal canal: No prevertebral fluid or swelling. No visible canal hematoma. Upper chest: Biapical pleural/pulmonary scarring. Other: None. IMPRESSION: 1. No acute intracranial abnormality. 2. No acute displaced fracture or traumatic listhesis of the cervical spine. Electronically Signed   By: Iven Finn M.D.   On: 06/10/2022 17:36   DG Hip Unilat With Pelvis 2-3 Views Left  Result Date: 06/10/2022 CLINICAL DATA:  Fall with left hip pain. EXAM: DG HIP (WITH OR WITHOUT PELVIS) 2-3V LEFT COMPARISON:   03/30/2021 FINDINGS: Stable appearance of previously placed cannulated pins in the proximal left femur across the femoral neck and into the femoral head. No evidence of acute fracture or dislocation. No abnormal lucency. The visualized bony pelvis appears intact. IMPRESSION: Stable appearance of previously placed left femoral pins. No evidence of acute fracture. Electronically Signed   By: Aletta Edouard M.D.   On: 06/10/2022 17:17   DG Forearm Left  Result Date: 06/10/2022 CLINICAL DATA:  Fall with left wrist pain. EXAM: LEFT FOREARM - 2 VIEW COMPARISON:  Left wrist films today. FINDINGS: See left wrist radiographs for description of a distal radial fracture which is also visible on the forearm radiographs and demonstrates minimal displacement. No additional radial or ulnar fractures identified with normal alignment present at the level of the elbow. IMPRESSION: Minimally displaced distal radial fracture again noted which is described on the left wrist radiograph report. No proximal radial or ulnar injury identified. Electronically Signed   By: Aletta Edouard M.D.   On: 06/10/2022 17:14   DG Wrist Complete Left  Result Date: 06/10/2022 CLINICAL DATA:  Fall with left wrist pain. EXAM: LEFT WRIST - COMPLETE 3+ VIEW COMPARISON:  None Available. FINDINGS: Acute fracture of the distal radius present through the region of the radial styloid that demonstrates no significant displacement. Bones are diffusely osteopenic. Carpal bones demonstrate normal alignment. Mild degenerative disease of the radiocarpal joint and carpal bones. IMPRESSION: Acute fracture of the distal left radius at the base of the radial styloid that demonstrates no significant displacement. Electronically Signed   By: Aletta Edouard M.D.   On: 06/10/2022 17:13   DG Pelvis Portable  Result Date: 06/10/2022 CLINICAL DATA:  Post fall with LEFT hip pain. EXAM: PORTABLE PELVIS 1-2 VIEWS COMPARISON:  March 30, 2021 FINDINGS: Minimally displaced  fracture of the superior pubic ramus on the LEFT and possible nondisplaced fracture of the inferior pubic ramus on the LEFT. Osteopenia without additional visible signs of fracture. Post RIGHT hip arthroplasty and prior  ORIF of LEFT hip fracture. IMPRESSION: Minimally displaced fracture of the superior pubic ramus on the LEFT and possible nondisplaced fracture of the LEFT inferior pubic ramus. No additional fractures are seen though dedicated views of the hip are not provided in this patient reportedly with LEFT hip pain. Electronically Signed   By: Zetta Bills M.D.   On: 06/10/2022 15:57   DG Chest Port 1 View  Result Date: 06/10/2022 CLINICAL DATA:  Fall with left-sided arm and hip pain. EXAM: PORTABLE CHEST 1 VIEW COMPARISON:  03/28/2021 FINDINGS: Midline trachea. Mild cardiomegaly. Atherosclerosis in the transverse aorta. Median sternotomy for CABG and left atrial appendage occlusion. Trace left pleural fluid or thickening, decreased compared to the prior. No pneumothorax. No congestive failure. Mild left base scarring. Mild right hemidiaphragm elevation. Convex left thoracolumbar spine curvature. IMPRESSION: No acute cardiopulmonary disease. Decreased trace left pleural fluid or thickening. Cardiomegaly without congestive failure. Aortic Atherosclerosis (ICD10-I70.0). Electronically Signed   By: Abigail Miyamoto M.D.   On: 06/10/2022 15:57    Procedures .Critical Care  Performed by: Tacy Learn, PA-C Authorized by: Tacy Learn, PA-C   Critical care provider statement:    Critical care time (minutes):  30   Critical care was time spent personally by me on the following activities:  Development of treatment plan with patient or surrogate, discussions with consultants, evaluation of patient's response to treatment, examination of patient, ordering and review of laboratory studies, ordering and review of radiographic studies, ordering and performing treatments and interventions, pulse oximetry,  re-evaluation of patient's condition and review of old charts     Medications Ordered in ED Medications  ondansetron (ZOFRAN) injection 4 mg (4 mg Intravenous Given 06/10/22 1559)  HYDROmorphone (DILAUDID) injection 0.5 mg (0.5 mg Intravenous Given 06/10/22 1559)  morphine (PF) 2 MG/ML injection 1 mg (1 mg Intravenous Given 06/10/22 1931)    ED Course/ Medical Decision Making/ A&P                           Medical Decision Making Amount and/or Complexity of Data Reviewed Labs: ordered. Radiology: ordered.  Risk Prescription drug management. Decision regarding hospitalization.   This patient presents to the ED for concern of injuries from a fall on blood thinners with left hip pain and left wrist pain, this involves an extensive number of treatment options, and is a complaint that carries with it a high risk of complications and morbidity.  The differential diagnosis includes hip fracture, dislocation, pelvic fracture, wrist fracture.  Intracranial injury, cervical spine injury, rhabdomyolysis.   Co morbidities that complicate the patient evaluation  Paroxysmal A-fib on Eliquis, DVT, CAD, CKD visual history as listed in chart   Additional history obtained:  Additional history obtained from EMS External records from outside source obtained and reviewed including progress note from cardiology dated 01/14/2022.  Notes patient has excepted being in A-fib, does not want to undergo cardioversion   Lab Tests:  I Ordered, and personally interpreted labs.  The pertinent results include: CK normal at 210.  CBC with nonspecific leukocytosis white count of 14,000 with increase in neutrophils.  CMP without significant findings. Urinalysis pending   Imaging Studies ordered:  I ordered imaging studies including portable chest, portable pelvis, XR left wrist and forearm, XR left hip. CT head and c-spine.  I independently visualized and interpreted imaging which showed left superior and  inferior pubic rami fractures.  Left distal radius fracture.  No acute intracranial injury,  no C-spine injury I agree with the radiologist interpretation   Cardiac Monitoring: / EKG:  The patient was maintained on a cardiac monitor.  I personally viewed and interpreted the cardiac monitored which showed an underlying rhythm of: A-fib, rate 72.  History of paroxysmal A-fib, is rate controlled, on Eliquis.   Consultations Obtained:  I requested consultation with the Dr. Tomi Bamberger, ER attending who saw patient on arrival,  and discussed lab and imaging findings as well as pertinent plan - they recommend: Imaging and labs as discussed.  Follow-up on results. Case discussed with Dr. Grandville Silos with trauma surgery given patient's findings for left superior and inferior pubic rami fractures as well as left radius fracture.  Dr. Grandville Silos feels to be appropriate for the hospitalist team to manage this patient with Ortho consulted. Case discussed with Dr. Griffin Basil on-call with orthopedics who has reviewed patient's images, recommends sugar-tong splint for the distal radius fracture.  Patient weightbearing as tolerated for her pubic rami fractures.  Ortho will see patient in the morning. Case discussed with Dr. Alcario Drought with [facility] hospitalist service who will consult for admission for pain control with Ortho to see in the morning.   Problem List / ED Course / Critical interventions / Medication management  88 year old female brought in by EMS from [facility] after a fall.  Patient is on Eliquis.  Level 2 trauma activated for fall on thinners.  Reports pain in her left hip and left wrist, was unable to get up after her fall and laid on the floor for approximately 7 hours.  Plan for CT head and C-spine for fall, portable chest and pelvis with follow-up imaging of the pelvis if needed for left hip injury and dedicated films left wrist and forearm.  Will assess labs including CK with concern for rhabdo for having laid  on the floor for the past 7 hours.  Reached out to patient's daughter [name] who is in route to the ER. I ordered medication including Dilaudid for pain Reevaluation of the patient after these medicines showed that the patient improved I have reviewed the patients home medicines and have made adjustments as needed   Social Determinants of Health:  Lives independently at [facility] facility.   Test / Admission - Considered:  Admit for further care         Final Clinical Impression(s) / ED Diagnoses Final diagnoses:  Fall, initial encounter  Closed fracture of multiple pubic rami, left, initial encounter [facility])  Other closed fracture of distal end of left radius, initial encounter    Rx / DC Orders ED Discharge Orders     None         Roque Lias 06/10/22 1932    Dorie Rank, MD 06/12/22 1250

## 2022-06-11 ENCOUNTER — Encounter (HOSPITAL_COMMUNITY): Payer: Self-pay | Admitting: Internal Medicine

## 2022-06-11 ENCOUNTER — Other Ambulatory Visit: Payer: Self-pay

## 2022-06-11 ENCOUNTER — Observation Stay (HOSPITAL_COMMUNITY): Payer: Medicare Other

## 2022-06-11 DIAGNOSIS — M4126 Other idiopathic scoliosis, lumbar region: Secondary | ICD-10-CM | POA: Diagnosis not present

## 2022-06-11 DIAGNOSIS — I480 Paroxysmal atrial fibrillation: Secondary | ICD-10-CM | POA: Diagnosis not present

## 2022-06-11 DIAGNOSIS — S32592A Other specified fracture of left pubis, initial encounter for closed fracture: Secondary | ICD-10-CM | POA: Diagnosis not present

## 2022-06-11 DIAGNOSIS — E039 Hypothyroidism, unspecified: Secondary | ICD-10-CM

## 2022-06-11 DIAGNOSIS — M545 Low back pain, unspecified: Secondary | ICD-10-CM | POA: Diagnosis not present

## 2022-06-11 DIAGNOSIS — S52502A Unspecified fracture of the lower end of left radius, initial encounter for closed fracture: Secondary | ICD-10-CM | POA: Diagnosis not present

## 2022-06-11 DIAGNOSIS — I272 Pulmonary hypertension, unspecified: Secondary | ICD-10-CM | POA: Diagnosis not present

## 2022-06-11 DIAGNOSIS — I10 Essential (primary) hypertension: Secondary | ICD-10-CM | POA: Diagnosis not present

## 2022-06-11 DIAGNOSIS — M8588 Other specified disorders of bone density and structure, other site: Secondary | ICD-10-CM | POA: Diagnosis not present

## 2022-06-11 MED ORDER — BOOST / RESOURCE BREEZE PO LIQD CUSTOM
1.0000 | Freq: Three times a day (TID) | ORAL | Status: DC
  Administered 2022-06-11 – 2022-06-17 (×9): 1 via ORAL

## 2022-06-11 MED ORDER — ENSURE ENLIVE PO LIQD
237.0000 mL | Freq: Two times a day (BID) | ORAL | Status: DC
  Administered 2022-06-11: 237 mL via ORAL

## 2022-06-11 MED ORDER — LEVOTHYROXINE SODIUM 25 MCG PO TABS
25.0000 ug | ORAL_TABLET | Freq: Every day | ORAL | Status: DC
  Administered 2022-06-12 – 2022-06-17 (×6): 25 ug via ORAL
  Filled 2022-06-11 (×6): qty 1

## 2022-06-11 NOTE — Assessment & Plan Note (Signed)
Continue Zoloft 

## 2022-06-11 NOTE — Consult Note (Cosign Needed Addendum)
ORTHOPAEDIC CONSULTATION  REQUESTING PHYSICIAN: Mariel Aloe, MD  Chief Complaint: Left wrist, left hip, low back and left hip pain  HPI: Maria [name] is a 88 y.o. female with history of A-fib and history of stroke on chronic Eliquis, hypertension, CHF who complains of left hip and left wrist pain after a fall in her bathroom yesterday.  She states she was on the ground for multiple hours and was wiggling around trying to make noise so that someone would find her she feels like she also twisted her neck and back in an effort to do this.  She was taken to the [facility] emergency department.  X-rays were taken showing a nondisplaced left distal radius fracture, left superior and inferior pubic rami fractures, stable left hip percutaneous pinning, negative cervical CT.  Patient's left wrist was splinted.Today she states wrist pain is better since splinting and with sling on.  She continues to have severe low back pain, left hip pain has improved with rest and pain medication.  Denies numbness or tingling.  Denies chest pain or shortness of breath.  Does endorse nausea but denies abdominal pain.  Past Medical History:  Diagnosis Date   Anemia    Atonic bladder 12/11/2013   Carotid artery occlusion    right carotid stenosis 40-60%, 4/08, neg for stenosis repeat study 11/11   Celiac disease    Chronic anticoagulation CHADS2VASC2 score 7 10/18/2014   Chronic diastolic CHF (congestive heart failure) (HCC)    takes Lasix daily   CKD (chronic kidney disease), stage III ([place])    Coronary artery disease    a.  08/2013 (LIMA-LAD, rSVG to LCX and Lt atrial clip),   Depression    DNR (do not resuscitate) 03/28/2021   DVT (deep venous thrombosis) ([place]) 09/2013   Gastric ulcer    6-70month ago   GERD (gastroesophageal reflux disease)    takes Omeprazole daily   GI bleeding    Hemorrhoids    Hot flashes    takes ZOloft 3 times a week   Hypertension    Hypothyroidism    takes Synthroid daily  as result of AMiodarone   Insomnia    Macular degeneration    Mild aortic insufficiency    a. Echo 10/2013: mild AI.   Moderate mitral regurgitation by prior echocardiogram    a. Echo 10/2013.   Moderate obstructive sleep apnea    uses CPAP;sleep study about 4-535monthago   MVP (mitral valve prolapse)    Osteoporosis    PAF (paroxysmal atrial fibrillation) (HCC)    Peripheral edema    left   Presbycusis    Pulmonary hypertension (HCC)    RLS (restless legs syndrome)    Thyroid nodule    20m54mno change 11/11   Urethral stricture    Urinary anastomotic stricture    Urinary retention    Vitamin D deficiency    Past Surgical History:  Procedure Laterality Date   APPENDECTOMY     bilateral cataract surgery     CARDIAC CATHETERIZATION  08-06-13   CORONARY ARTERY BYPASS GRAFT N/A 08/28/2013   Procedure: CORONARY ARTERY BYPASS GRAFTING (CABG) x2 using right greater saphenous vein and left internal mammary artery. ;  Surgeon: EdwGrace IsaacD;  [facility];  Laterality: N/A;   HIP ARTHROPLASTY Right 12/09/2013   Procedure: ARTHROPLASTY BIPOLAR HIP CEMENTED;  Surgeon: FraKerin SalenD;  [facility] [place];  Laterality: Right;  HIP PINNING,CANNULATED Left 03/30/2021   Procedure: CANNULATED HIP PINNING;  Surgeon: Marchia Bond, MD;  Location: [place];  Service: Orthopedics;  Laterality: Left;   INTRAOPERATIVE TRANSESOPHAGEAL ECHOCARDIOGRAM N/A 08/28/2013   Procedure: INTRAOPERATIVE TRANSESOPHAGEAL ECHOCARDIOGRAM;  Surgeon: Grace Isaac, MD;  Location: [place];  Service: Open Heart Surgery;  Laterality: N/A;   LEFT AND RIGHT HEART CATHETERIZATION WITH CORONARY ANGIOGRAM N/A 08/16/2013   Procedure: LEFT AND RIGHT HEART CATHETERIZATION WITH CORONARY ANGIOGRAM;  Surgeon: [name], MD;  Location[facility] LAB;  Service: Cardiovascular;  Laterality: N/A;   MANDIBLE SURGERY     TCS     TONSILLECTOMY     trigger thumb     TUBAL  LIGATION     Social History   Socioeconomic History   Marital status: Widowed    Spouse name: Not on file   Number of children: Not on file   Years of education: Not on file   Highest education level: Not on file  Occupational History   Occupation: retired  Tobacco Use   Smoking status: Never   Smokeless tobacco: Never  Vaping Use   Vaping Use: Never used  Substance and Sexual Activity   Alcohol use: No   Drug use: No   Sexual activity: Yes    Birth control/protection: Surgical  Other Topics Concern   Not on file  Social History Narrative   Lives in retirement community   Right handed   Social Determinants of Health   Financial Resource Strain: Not on file  Food Insecurity: Not on file  Transportation Needs: Not on file  Physical Activity: Not on file  Stress: Not on file  Social Connections: Not on file   Family History  Problem Relation Age of Onset   Heart attack Mother    CVA Mother    CAD Father    Colon cancer Father    Heart attack Father    Heart attack Brother    Peripheral vascular disease Brother    AAA (abdominal aortic aneurysm) Brother    Allergies  Allergen Reactions   Fentanyl Nausea And Vomiting    Severe n/v   Amoxicillin Nausea Only and Other (See Comments)    dizziness   Atorvastatin Other (See Comments)    Unknown reaction - reported by [facility]   Bupropion Other (See Comments)    nightmares   Codeine Nausea And Vomiting   Fosamax [Alendronate Sodium] Other (See Comments)    Jaw pain   Gabapentin Other (See Comments)    dizziness   Gluten Meal Other (See Comments)    Celiac Disease    Hydrochlorothiazide Other (See Comments)    Hyponatremia, GI upset    Tetanus Toxoid, Adsorbed Swelling    Severe swelling at site of injection (pt tolerates 1/2 injection then 1/2 week later)     Positive ROS: All other systems have been reviewed and were otherwise negative with the exception of those mentioned in the HPI and as  above.  Physical Exam: General: Alert, no acute distress Cardiovascular: No pedal edema Respiratory: No cyanosis, no use of accessory musculature GI: No organomegaly, abdomen is soft and non-tender Skin: No lesions in the area of chief complaint Neurologic: Sensation intact distally Psychiatric: Patient is competent for consent with normal mood and affect Lymphatic: No axillary or cervical lymphadenopathy  MUSCULOSKELETAL:  LUE: Left wrist and well fitting splint.  Able to flex and extend all fingers of the left hand, sensation and motor intact to median radial  and ulnar nerve distributions.  Capillary refill intact. LLE: Dorsiflexion plantarflexion intact.  2+ DP pulse.  Sensation intact to all aspects of left foot.  Mild tenderness palpation at left hip.  Assessment: Left nondisplaced distal radius fracture Left superior and inferior pubic rami fractures History of left hip fracture s/p percutaneous pinning  Plan: - Left hip percutaneous pinning looks to be stable. Okay to weight-bear as tolerated on bilateral lower extremities.  Patient walks with a walker at baseline.  Nonweightbearing through left wrist okay to weight-bear through the forearm with a platform walker.  -Okay for PT/OT evals - Patient lives in independent living facility at baseline will likely need SNF level care on discharge.  TOC on board. -I will also go ahead and ordered lumbar spine x-rays, I have reviewed her images that we have taken at our office previously which shows a previous T10 compression fracture as well as multilevel degenerative changes of the thoracic and lumbar spine. - continue current pain medication - continue wrist splint, sling is optional - please reach out for any questions or concerns  Addendum: x-rays show Chronic compression deformities of the L1 and L2 vertebral bodies, not significantly changed compared to previous plain film of 01/05/2017. Chronic levoscoliosis of the lumbar spine,  moderate to severe in degree and grossly stable compared to the earlier CT abdomen/pelvis of 08/30/2018. No acute lumbar findings. Continue pain control prn.   Ventura Bruns, PA-C  06/11/2022 8:47 AM

## 2022-06-11 NOTE — Progress Notes (Signed)
PROGRESS NOTE    LEVAEH VICE  NIO:[id number] DOB: Apr 14, 1924 DOA: 06/10/2022 PCP: [name], MD   Brief Narrative: No notes on file   Assessment and Plan: * Fracture of multiple pubic rami, left, closed, initial encounter [facility]) Orthopedic surgery consulted. Recommendations for weight bearing as tolerated. PT/OT consulted with recommendations for SNF. -Continue Norco PO for pain; morphine IV for breakthrough -Mobility as tolerated  Fracture of distal end of left radius Currently with wrist splint. Nonoperative management.  Pulmonary hypertension, moderate to severe (HCC) -Continue home Lasix  Paroxysmal atrial fibrillation (HCC) Hemoglobin normal and stable. -Continue home Cardizem, amiodarone and Eliquis  Hypothyroidism -Continue Synthroid 25 mcg daily  Depression -Continue Zoloft  Essential hypertension, benign -Continue diltiazem    DVT prophylaxis: Eliquis Code Status:   Code Status: DNR Family Communication: Daughter on telephone Disposition Plan: Discharge to SNF likely in 3-4 days pending bed availability   Consultants:  Orthopedic surgery  Procedures:  None  Antimicrobials: None    Subjective: Patient reports left hand pain. She also has pelvic pain. Difficult to get up and walk. She has pain whenever she moves.  Objective: BP 114/61 (BP Location: Right Arm)   Pulse 64   Temp 98.1 F (36.7 C) (Oral)   Resp 17   Ht 5' (1.524 m)   Wt 41.7 kg   SpO2 99%   BMI 17.97 kg/m   Examination:  General exam: Appears calm and comfortable Respiratory system: Clear to auscultation. Respiratory effort normal. Cardiovascular system: S1 & S2 heard, RRR. No murmurs, rubs, gallops or clicks. Gastrointestinal system: Abdomen is nondistended, soft and nontender. Normal bowel sounds heard. Central nervous system: Alert and oriented. No focal neurological deficits. Musculoskeletal: Left hand edema with tenderness over 4th/5th metacarpals.   Skin: multiple areas of ecchymosis Psychiatry: Judgement and insight appear normal. Mood & affect appropriate.    Data Reviewed: I have personally reviewed following labs and imaging studies  CBC Lab Results  Component Value Date   WBC 14.0 (H) 06/10/2022   RBC 3.86 (L) 06/10/2022   HGB 12.6 06/10/2022   HCT 37.0 06/10/2022   MCV 92.2 06/10/2022   MCH 31.1 06/10/2022   PLT 235 06/10/2022   MCHC 33.7 06/10/2022   RDW 14.9 06/10/2022   LYMPHSABS 0.3 (L) 06/10/2022   MONOABS 0.4 06/10/2022   EOSABS 0.0 06/10/2022   BASOSABS 0.0 81/82/9937     Last metabolic panel Lab Results  Component Value Date   NA 131 (L) 06/10/2022   K 3.9 06/10/2022   CL 101 06/10/2022   CO2 21 (L) 06/10/2022   BUN 9 06/10/2022   CREATININE 0.50 06/10/2022   GLUCOSE 102 (H) 06/10/2022   GFRNONAA >60 06/10/2022   GFRAA >60 11/30/2018   CALCIUM 8.7 (L) 06/10/2022   PROT 6.8 06/10/2022   ALBUMIN 3.4 (L) 06/10/2022   LABGLOB 3.0 10/02/2021   AGRATIO 1.3 10/02/2021   BILITOT 0.8 06/10/2022   ALKPHOS 74 06/10/2022   AST 48 (H) 06/10/2022   ALT 30 06/10/2022   ANIONGAP 7 06/10/2022    GFR: Estimated Creatinine Clearance: 27.1 mL/min (by C-G formula based on SCr of 0.5 mg/dL).  No results found for this or any previous visit (from the past 240 hour(s)).    Radiology Studies: CT Head Wo Contrast  Result Date: 06/10/2022 CLINICAL DATA:  injuries from a fall. They report pt fell at 6am and has been lying in the floor all day. Pt reports injury to L wrist, L hip, and whole  back. Pt is A/Ox4 on arrival. EMS has C-collar and SAM splint to L wrist. EXAM: CT HEAD WITHOUT CONTRAST CT CERVICAL SPINE WITHOUT CONTRAST TECHNIQUE: Multidetector CT imaging of the head and cervical spine was performed following the standard protocol without intravenous contrast. Multiplanar CT image reconstructions of the cervical spine were also generated. RADIATION DOSE REDUCTION: This exam was performed according to the  departmental dose-optimization program which includes automated exposure control, adjustment of the mA and/or kV according to patient size and/or use of iterative reconstruction technique. COMPARISON:  None Available. FINDINGS: CT HEAD FINDINGS BRAIN: BRAIN Cerebral ventricle sizes are concordant with the degree of cerebral volume loss. Patchy and confluent areas of decreased attenuation are noted throughout the deep and periventricular white matter of the cerebral hemispheres bilaterally, compatible with chronic microvascular ischemic disease. No evidence of large-territorial acute infarction. No parenchymal hemorrhage. No mass lesion. No extra-axial collection. No mass effect or midline shift. No hydrocephalus. Basilar cisterns are patent. Vascular: No hyperdense vessel. Skull: No acute fracture or focal lesion. Sinuses/Orbits: Paranasal sinuses and mastoid air cells are clear. Bilateral lens replacement. Otherwise the orbits are unremarkable. Other: None. CT CERVICAL SPINE FINDINGS Alignment: Normal. Skull base and vertebrae: Multilevel moderate degenerative changes of the spine. Associated multilevel moderate to severe degenerative changes spine. No acute fracture. No aggressive appearing focal osseous lesion or focal pathologic process. Soft tissues and spinal canal: No prevertebral fluid or swelling. No visible canal hematoma. Upper chest: Biapical pleural/pulmonary scarring. Other: None. IMPRESSION: 1. No acute intracranial abnormality. 2. No acute displaced fracture or traumatic listhesis of the cervical spine. Electronically Signed   By: Iven Finn M.D.   On: 06/10/2022 17:36   CT Cervical Spine Wo Contrast  Result Date: 06/10/2022 CLINICAL DATA:  injuries from a fall. They report pt fell at 6am and has been lying in the floor all day. Pt reports injury to L wrist, L hip, and whole back. Pt is A/Ox4 on arrival. EMS has C-collar and SAM splint to L wrist. EXAM: CT HEAD WITHOUT CONTRAST CT CERVICAL  SPINE WITHOUT CONTRAST TECHNIQUE: Multidetector CT imaging of the head and cervical spine was performed following the standard protocol without intravenous contrast. Multiplanar CT image reconstructions of the cervical spine were also generated. RADIATION DOSE REDUCTION: This exam was performed according to the departmental dose-optimization program which includes automated exposure control, adjustment of the mA and/or kV according to patient size and/or use of iterative reconstruction technique. COMPARISON:  None Available. FINDINGS: CT HEAD FINDINGS BRAIN: BRAIN Cerebral ventricle sizes are concordant with the degree of cerebral volume loss. Patchy and confluent areas of decreased attenuation are noted throughout the deep and periventricular white matter of the cerebral hemispheres bilaterally, compatible with chronic microvascular ischemic disease. No evidence of large-territorial acute infarction. No parenchymal hemorrhage. No mass lesion. No extra-axial collection. No mass effect or midline shift. No hydrocephalus. Basilar cisterns are patent. Vascular: No hyperdense vessel. Skull: No acute fracture or focal lesion. Sinuses/Orbits: Paranasal sinuses and mastoid air cells are clear. Bilateral lens replacement. Otherwise the orbits are unremarkable. Other: None. CT CERVICAL SPINE FINDINGS Alignment: Normal. Skull base and vertebrae: Multilevel moderate degenerative changes of the spine. Associated multilevel moderate to severe degenerative changes spine. No acute fracture. No aggressive appearing focal osseous lesion or focal pathologic process. Soft tissues and spinal canal: No prevertebral fluid or swelling. No visible canal hematoma. Upper chest: Biapical pleural/pulmonary scarring. Other: None. IMPRESSION: 1. No acute intracranial abnormality. 2. No acute displaced fracture or traumatic listhesis of  the cervical spine. Electronically Signed   By: Iven Finn M.D.   On: 06/10/2022 17:36   DG Hip Unilat  With Pelvis 2-3 Views Left  Result Date: 06/10/2022 CLINICAL DATA:  Fall with left hip pain. EXAM: DG HIP (WITH OR WITHOUT PELVIS) 2-3V LEFT COMPARISON:  03/30/2021 FINDINGS: Stable appearance of previously placed cannulated pins in the proximal left femur across the femoral neck and into the femoral head. No evidence of acute fracture or dislocation. No abnormal lucency. The visualized bony pelvis appears intact. IMPRESSION: Stable appearance of previously placed left femoral pins. No evidence of acute fracture. Electronically Signed   By: Aletta Edouard M.D.   On: 06/10/2022 17:17   DG Forearm Left  Result Date: 06/10/2022 CLINICAL DATA:  Fall with left wrist pain. EXAM: LEFT FOREARM - 2 VIEW COMPARISON:  Left wrist films today. FINDINGS: See left wrist radiographs for description of a distal radial fracture which is also visible on the forearm radiographs and demonstrates minimal displacement. No additional radial or ulnar fractures identified with normal alignment present at the level of the elbow. IMPRESSION: Minimally displaced distal radial fracture again noted which is described on the left wrist radiograph report. No proximal radial or ulnar injury identified. Electronically Signed   By: Aletta Edouard M.D.   On: 06/10/2022 17:14   DG Wrist Complete Left  Result Date: 06/10/2022 CLINICAL DATA:  Fall with left wrist pain. EXAM: LEFT WRIST - COMPLETE 3+ VIEW COMPARISON:  None Available. FINDINGS: Acute fracture of the distal radius present through the region of the radial styloid that demonstrates no significant displacement. Bones are diffusely osteopenic. Carpal bones demonstrate normal alignment. Mild degenerative disease of the radiocarpal joint and carpal bones. IMPRESSION: Acute fracture of the distal left radius at the base of the radial styloid that demonstrates no significant displacement. Electronically Signed   By: Aletta Edouard M.D.   On: 06/10/2022 17:13   DG Pelvis  Portable  Result Date: 06/10/2022 CLINICAL DATA:  Post fall with LEFT hip pain. EXAM: PORTABLE PELVIS 1-2 VIEWS COMPARISON:  March 30, 2021 FINDINGS: Minimally displaced fracture of the superior pubic ramus on the LEFT and possible nondisplaced fracture of the inferior pubic ramus on the LEFT. Osteopenia without additional visible signs of fracture. Post RIGHT hip arthroplasty and prior ORIF of LEFT hip fracture. IMPRESSION: Minimally displaced fracture of the superior pubic ramus on the LEFT and possible nondisplaced fracture of the LEFT inferior pubic ramus. No additional fractures are seen though dedicated views of the hip are not provided in this patient reportedly with LEFT hip pain. Electronically Signed   By: Zetta Bills M.D.   On: 06/10/2022 15:57   DG Chest Port 1 View  Result Date: 06/10/2022 CLINICAL DATA:  Fall with left-sided arm and hip pain. EXAM: PORTABLE CHEST 1 VIEW COMPARISON:  03/28/2021 FINDINGS: Midline trachea. Mild cardiomegaly. Atherosclerosis in the transverse aorta. Median sternotomy for CABG and left atrial appendage occlusion. Trace left pleural fluid or thickening, decreased compared to the prior. No pneumothorax. No congestive failure. Mild left base scarring. Mild right hemidiaphragm elevation. Convex left thoracolumbar spine curvature. IMPRESSION: No acute cardiopulmonary disease. Decreased trace left pleural fluid or thickening. Cardiomegaly without congestive failure. Aortic Atherosclerosis (ICD10-I70.0). Electronically Signed   By: Abigail Miyamoto M.D.   On: 06/10/2022 15:57      LOS: 0 days    Cordelia Poche, MD [facility] 06/11/2022, 8:57 AM   If 7PM-7AM, please contact night-coverage [email or web address]

## 2022-06-11 NOTE — TOC Initial Note (Signed)
Transition of Care [facility]) - Initial/Assessment Note    Patient Details  Name: Maria [name] MRN: [id number] Date of Birth: June 05, 1924  Transition of [facility]) CM/SW Contact:    Joanne Chars, LCSW Phone Number: 06/11/2022, 4:05 PM  Clinical Narrative:     Pt asleep, CSW met with daughter [name].  Pt is from independent living cottage at [facility].  [name] had questions about CIR but is agreeable to return to [facility] for rehab.  [place] given.  TOC will continue to follow.    Referral sent to [facility].               Expected Discharge Plan: Skilled Nursing Facility Barriers to Discharge: Continued Medical Work up   Patient Goals and CMS Choice     Choice offered to / list presented to : Adult Children (daughter [name])  Expected Discharge Plan and Services Expected Discharge Plan: [facility] In-house Referral: Clinical Social Work   Post Acute Care Choice: [place] Living arrangements for the past 2 months: [place]                                      Prior Living Arrangements/Services Living arrangements for the past 2 months: [place] Lives with:: Facility Resident ([facility]) Patient language and need for interpreter reviewed:: No        Need for Family Participation in Patient Care: Yes (Comment) Care giver support system in place?: Yes (comment) Current home services: Other (comment) (none) Criminal Activity/Legal Involvement Pertinent to Current Situation/Hospitalization: No - Comment as needed  Activities of Daily Living Home Assistive Devices/Equipment: Walker (specify type), Built-in shower seat ADL Screening (condition at time of admission) Patient's cognitive ability adequate to safely complete daily activities?: No Is the patient deaf or have difficulty hearing?: Yes Does the patient have difficulty seeing, even when wearing glasses/contacts?: No Does the  patient have difficulty concentrating, remembering, or making decisions?: No Patient able to express need for assistance with ADLs?: Yes Does the patient have difficulty dressing or bathing?: No Independently performs ADLs?: Yes (appropriate for developmental age) Does the patient have difficulty walking or climbing stairs?: No Weakness of Legs: Both Weakness of Arms/Hands: None  Permission Sought/Granted                  Emotional Assessment Appearance:: Appears stated age Attitude/Demeanor/Rapport: Unable to Assess Affect (typically observed): Unable to Assess Orientation: : Oriented to Self, Oriented to Place, Oriented to Situation Alcohol / Substance Use: Not Applicable Psych Involvement: No (comment)  Admission diagnosis:  Fall, initial encounter [W19.XXXA] Closed fracture of multiple pubic rami, left, initial encounter ([place]) [S32.592A] Other closed fracture of distal end of left radius, initial encounter [S52.592A] Patient Active Problem List   Diagnosis Date Noted   Fracture of distal end of left radius 06/10/2022   Fracture of multiple pubic rami, left, closed, initial encounter ([place]) 06/10/2022   Malnutrition of moderate degree 04/04/2021   Femur fracture, left ([place]) 04/02/2021   Fracture    Closed left hip fracture, initial encounter ([place]) 03/28/2021   Celiac disease 03/28/2021   DNR (do not resuscitate) 03/28/2021   Left hand weakness 11/29/2018   Dehydration with hyponatremia 11/29/2018   CVA (cerebral vascular accident) ([place]) 11/26/2018   Chest pain 11/26/2018   On amiodarone therapy 07/13/2017   Frailty 07/13/2017   CKD (chronic kidney disease), stage III ([place])  Hypothyroidism 10/18/2014   Chronic anticoagulation CHADS2VASC2 score 7 10/18/2014   Right leg pain 07/22/2014   Gout of foot 04/10/2014   Atonic bladder 12/11/2013   Hyponatremia 12/08/2013   Closed right hip fracture ([place]) 12/07/2013   Protein-calorie malnutrition, severe ([place]) 10/04/2013    DVT (deep venous thrombosis) ([place]) 10/03/2013   Coronary artery disease involving coronary bypass graft of native heart with angina pectoris ([place]) 09/12/2013   Mitral regurgitation 08/07/2013   Pulmonary hypertension, moderate to severe ([place]) 08/07/2013   Essential hypertension, benign 08/02/2013   Nontoxic uninodular goiter 08/02/2013   Unspecified hereditary and idiopathic peripheral neuropathy 08/02/2013   Carotid disease, bilateral ([employer]) 08/02/2013   Unspecified vitamin D deficiency 08/02/2013   Depressive disorder, not elsewhere classified 08/02/2013   Paroxysmal atrial fibrillation ([place]) 08/02/2013   Trifascicular block 08/02/2013   Gastric ulcer, unspecified as acute or chronic, without mention of hemorrhage, perforation, or obstruction 08/02/2013   Sinoatrial node dysfunction ([place]) 08/02/2013   PCP:  [name], MD Pharmacy:   [facility], [place] - [address] [place] [address] Phone: [phone] Fax: [phone]     Social Determinants of Health (SDOH) Interventions    Readmission Risk Interventions     No data to display

## 2022-06-11 NOTE — Assessment & Plan Note (Signed)
-  Continue Synthroid 25 mcg daily

## 2022-06-11 NOTE — NC FL2 (Signed)
[place] LEVEL OF CARE SCREENING TOOL     IDENTIFICATION  Patient Name: Maria [name] Birthdate: 1924-07-27 Sex: female Admission Date (Current Location): 06/10/2022  [facility] and [place] Number:  Herbalist and Address:  [facility], [place] [address] [place], [place] [address]      Provider Number: [id number]  Attending Physician Name and Address:  Mariel Aloe, MD  Relative Name and Phone Number:  Gunnar Fusi Daughter [phone]  [phone]    Current Level of Care: Hospital Recommended Level of Care: [place] Prior Approval Number:    Date Approved/Denied:   PASRR Number: [id number]  Discharge Plan: Home    Current Diagnoses: Patient Active Problem List   Diagnosis Date Noted   Fracture of distal end of left radius 06/10/2022   Fracture of multiple pubic rami, left, closed, initial encounter ([place]) 06/10/2022   Malnutrition of moderate degree 04/04/2021   Femur fracture, left ([place]) 04/02/2021   Fracture    Closed left hip fracture, initial encounter ([place]) 03/28/2021   Celiac disease 03/28/2021   DNR (do not resuscitate) 03/28/2021   Left hand weakness 11/29/2018   Dehydration with hyponatremia 11/29/2018   CVA (cerebral vascular accident) ([place]) 11/26/2018   Chest pain 11/26/2018   On amiodarone therapy 07/13/2017   Frailty 07/13/2017   CKD (chronic kidney disease), stage III ([place])    Hypothyroidism 10/18/2014   Chronic anticoagulation CHADS2VASC2 score 7 10/18/2014   Right leg pain 07/22/2014   Gout of foot 04/10/2014   Atonic bladder 12/11/2013   Hyponatremia 12/08/2013   Closed right hip fracture ([place]) 12/07/2013   Protein-calorie malnutrition, severe ([place]) 10/04/2013   DVT (deep venous thrombosis) (HCC) 10/03/2013   Coronary artery disease involving coronary bypass graft of native heart with angina pectoris ([place]) 09/12/2013   Mitral regurgitation 08/07/2013   Pulmonary  hypertension, moderate to severe ([place]) 08/07/2013   Essential hypertension, benign 08/02/2013   Nontoxic uninodular goiter 08/02/2013   Unspecified hereditary and idiopathic peripheral neuropathy 08/02/2013   Carotid disease, bilateral ([place]) 08/02/2013   Unspecified vitamin D deficiency 08/02/2013   Depressive disorder, not elsewhere classified 08/02/2013   Paroxysmal atrial fibrillation ([place]) 08/02/2013   Trifascicular block 08/02/2013   Gastric ulcer, unspecified as acute or chronic, without mention of hemorrhage, perforation, or obstruction 08/02/2013   Sinoatrial node dysfunction ([place]) 08/02/2013    Orientation RESPIRATION BLADDER Height & Weight     Self, Situation, Place  O2 Incontinent, External catheter Weight: 92 lb (41.7 kg) Height:  5' (152.4 cm)  BEHAVIORAL SYMPTOMS/MOOD NEUROLOGICAL BOWEL NUTRITION STATUS      Continent Diet (see discharge summary)  AMBULATORY STATUS COMMUNICATION OF NEEDS Skin   Total Care Verbally Other (Comment) (redness)                       Personal Care Assistance Level of Assistance  Bathing, Feeding, Dressing, Total care Bathing Assistance: Maximum assistance Feeding assistance: Limited assistance Dressing Assistance: Maximum assistance Total Care Assistance: Maximum assistance   Functional Limitations Info  Sight, Hearing, Speech Sight Info: Adequate Hearing Info: Impaired Speech Info: Adequate    SPECIAL CARE FACTORS FREQUENCY  PT (By licensed PT), OT (By licensed OT)     PT Frequency: 5x week OT Frequency: 5x week            Contractures Contractures Info: Not present    Additional Factors Info  Code Status, Allergies Code Status Info: DNR Allergies Info: Fentanyl,  Amoxicillin, Atorvastatin, Bupropion, Codeine, Fosamax (Alendronate Sodium), Gabapentin, Gluten Meal, Hydrochlorothiazide, Tetanus Toxoid, Adsorbed           Current Medications (06/11/2022):  This is the current hospital active medication  list Current Facility-Administered Medications  Medication Dose Route Frequency Provider Last Rate Last Admin   amiodarone (PACERONE) tablet 200 mg  200 mg Oral QHS Jennette Kettle M, DO   200 mg at 06/10/22 2219   apixaban (ELIQUIS) tablet 2.5 mg  2.5 mg Oral BID Etta Quill, DO   2.5 mg at 06/11/22 1026   diltiazem (CARDIZEM CD) 24 hr capsule 120 mg  120 mg Oral Daily Jennette Kettle M, DO   120 mg at 06/11/22 1026   feeding supplement (ENSURE ENLIVE / ENSURE PLUS) liquid 237 mL  237 mL Oral BID BM Etta Quill, DO   237 mL at 06/11/22 1210   [START ON 06/12/2022] furosemide (LASIX) tablet 40 mg  40 mg Oral Q48H Jennette Kettle M, DO       furosemide (LASIX) tablet 80 mg  80 mg Oral Q48H Jennette Kettle M, DO   80 mg at 06/11/22 1026   HYDROcodone-acetaminophen (NORCO/VICODIN) 5-325 MG per tablet 1-2 tablet  1-2 tablet Oral Q6H PRN Etta Quill, DO   1 tablet at 06/10/22 2220   [START ON 06/12/2022] levothyroxine (SYNTHROID) tablet 25 mcg  25 mcg Oral Q0600 Mariel Aloe, MD       magnesium hydroxide (MILK OF MAGNESIA) suspension 15 mL  15 mL Oral QHS PRN Etta Quill, DO       melatonin tablet 5 mg  5 mg Oral QHS Jennette Kettle M, DO   5 mg at 06/10/22 2220   morphine (PF) 2 MG/ML injection 0.5 mg  0.5 mg Intravenous Q2H PRN Etta Quill, DO       polyvinyl alcohol (LIQUIFILM TEARS) 1.4 % ophthalmic solution 1 drop  1 drop Both Eyes QHS Alcario Drought, Jared M, DO       sertraline (ZOLOFT) tablet 50 mg  50 mg Oral q morning Jennette Kettle M, DO   50 mg at 06/11/22 1026     Discharge Medications: Please see discharge summary for a list of discharge medications.  Relevant Imaging Results:  Relevant Lab Results:   Additional Information SSN: [id number] Has had COVID vaccinations  Joanne Chars, LCSW

## 2022-06-11 NOTE — Assessment & Plan Note (Signed)
-  Continue diltiazem

## 2022-06-11 NOTE — Evaluation (Signed)
Occupational Therapy Evaluation Patient Details Name: Maria [name] MRN: [id number] DOB: [birth year] Today's Date: 06/11/2022   History of Present Illness Pt is a 88 y/o female who presented after mechanical fall at her ILF with prolonged time down on floor. Pt found to have fx of multiple pubic rami (L), L distal radius fx - both treated nonsurgically. Pt also found with chronic fx of T10, L1, L2. PMH: a fib, HTN, CAD, CKD, GERD, macular degeneration   Clinical Impression   PTA, pt lives in an ILF, typically Modified Independent with ADLs/mobility using Rollator. Pt presents now with deficits in pain (primarily L LE and back), standing balance, strength, endurance and impaired L UE function. Overall, pt able to progress standing trials with L platform walker to Mod A x 2 though difficulty WB through L LE. Pt requires Mod-Max A for UB ADL and Total A (x 2 if in standing) for LB ADLs. Pt's family present, hands on to assist during session. Based on deficits and significantly below baseline, recommend SNF rehab at [place].       Recommendations for follow up therapy are one component of a multi-disciplinary discharge planning process, led by the attending physician.  Recommendations may be updated based on patient status, additional functional criteria and insurance authorization.   Follow Up Recommendations  Skilled nursing-short term rehab (<3 hours/day) (pt would like to go to rehab associated with her ILF)    Assistance Recommended at Discharge Frequent or constant Supervision/Assistance  Patient can return home with the following Two people to help with walking and/or transfers;Two people to help with bathing/dressing/bathroom    Functional Status Assessment  Patient has had a recent decline in their functional status and demonstrates the ability to make significant improvements in function in a reasonable and predictable amount of time.  Equipment Recommendations  Other (comment) (L  platform RW)    Recommendations for Other Services       Precautions / Restrictions Precautions Precautions: Fall;Other (comment) Precaution Comments: back precautions and sling for comfort Required Braces or Orthoses: Sling Restrictions Weight Bearing Restrictions: Yes LUE Weight Bearing: Weight bear through elbow only RLE Weight Bearing: Weight bearing as tolerated LLE Weight Bearing: Weight bearing as tolerated      Mobility Bed Mobility               General bed mobility comments: up in chair on entry    Transfers Overall transfer level: Needs assistance Equipment used: Left platform walker Transfers: Sit to/from Stand Sit to Stand: Mod assist, +2 physical assistance, +2 safety/equipment           General transfer comment: 2 trials with son present and eager to assist. On second trial, pt able to get L UE on platform with assist and maintain standing > 10 seconds      Balance Overall balance assessment: Needs assistance Sitting-balance support: No upper extremity supported, Feet supported Sitting balance-Leahy Scale: Fair     Standing balance support: Bilateral upper extremity supported, During functional activity Standing balance-Leahy Scale: Poor Standing balance comment: external assist + DME                           ADL either performed or assessed with clinical judgement   ADL Overall ADL's : Needs assistance/impaired Eating/Feeding: Set up;Sitting   Grooming: Minimal assistance;Sitting   Upper Body Bathing: Moderate assistance;Sitting   Lower Body Bathing: Total assistance;+2 for physical assistance;+2 for safety/equipment;Sit to/from stand  Upper Body Dressing : Maximal assistance;Sitting   Lower Body Dressing: Total assistance;+2 for physical assistance;+2 for safety/equipment;Sit to/from stand   Toilet Transfer: Maximal assistance;+2 for physical assistance;+2 for safety/equipment;Stand-pivot;BSC/3in1   Toileting-  Clothing Manipulation and Hygiene: Total assistance;+2 for physical assistance;+2 for safety/equipment;Sit to/from stand;Sitting/lateral lean         General ADL Comments: Pt limited by significant L LE/hip pain from prolonged time on floor, impaired use of L UE and back pain     Vision Baseline Vision/History: 1 Wears glasses Ability to See in Adequate Light: 0 Adequate Patient Visual Report: No change from baseline Vision Assessment?: No apparent visual deficits     Perception     Praxis      Pertinent Vitals/Pain Pain Assessment Pain Assessment: Faces Faces Pain Scale: Hurts even more Pain Location: L hip, back Pain Descriptors / Indicators: Aching, Discomfort, Grimacing, Guarding Pain Intervention(s): Monitored during session, Limited activity within patient's tolerance     Hand Dominance Right   Extremity/Trunk Assessment Upper Extremity Assessment Upper Extremity Assessment: LUE deficits/detail LUE Deficits / Details: bandaged wrist to proximal elbow, digits very swollen, able to extend but increased pain with minimal flexion attempts LUE Coordination: decreased fine motor   Lower Extremity Assessment Lower Extremity Assessment: Defer to PT evaluation   Cervical / Trunk Assessment Cervical / Trunk Assessment: Kyphotic;Other exceptions Cervical / Trunk Exceptions: chronic compression fx T10, L1, L2   Communication Communication Communication: HOH   Cognition Arousal/Alertness: Awake/alert Behavior During Therapy: WFL for tasks assessed/performed Overall Cognitive Status: Within Functional Limits for tasks assessed                                       General Comments  Son present and hands on to assist. daughter entering during session. Son with questions regarding fx, plans - provided general education from orthopedics note on fx locations, [facility] recs    Exercises     Shoulder Instructions      Home Living Family/patient expects to be  discharged to:: Private residence Living Arrangements: Alone Available Help at Discharge: Family;Available PRN/intermittently Type of Home: Independent living facility Home Access: Level entry     Home Layout: One level     Bathroom Shower/Tub: Occupational psychologist: Standard     Home Equipment: Cane - single point;Rollator (4 wheels);Shower seat;Grab bars - toilet;Grab bars - tub/shower;Wheelchair - manual          Prior Functioning/Environment Prior Level of Function : Independent/Modified Independent             Mobility Comments: used rollator at all times per pt ADLs Comments: completed ADLs MOD I, hired cleaning services and facility provided meals        OT Problem List: Decreased strength;Decreased activity tolerance;Impaired balance (sitting and/or standing);Decreased coordination;Decreased knowledge of use of DME or AE;Decreased knowledge of precautions;Pain;Impaired UE functional use;Increased edema      OT Treatment/Interventions: Self-care/ADL training;Therapeutic exercise;Energy conservation;DME and/or AE instruction;Therapeutic activities;Patient/family education;Balance training    OT Goals(Current goals can be found in the care plan section) Acute Rehab OT Goals Patient Stated Goal: pain control, slowly increase mobility OT Goal Formulation: With patient/family Time For Goal Achievement: 06/25/22 Potential to Achieve Goals: Good  OT Frequency: Min 2X/week    Co-evaluation              AM-PAC OT "6 Clicks" Daily Activity  Outcome Measure Help from another person eating meals?: A Little Help from another person taking care of personal grooming?: A Little Help from another person toileting, which includes using toliet, bedpan, or urinal?: Total Help from another person bathing (including washing, rinsing, drying)?: A Lot Help from another person to put on and taking off regular upper body clothing?: A Lot Help from another person  to put on and taking off regular lower body clothing?: Total 6 Click Score: 12   End of Session Equipment Utilized During Treatment: Gait belt;Other (comment) (L platform walker)  Activity Tolerance: Patient tolerated treatment well Patient left: in chair;with call bell/phone within reach;with chair alarm set;with family/visitor present  OT Visit Diagnosis: Unsteadiness on feet (R26.81);Other abnormalities of gait and mobility (R26.89);Muscle weakness (generalized) (M62.81)                Time: 1030-1104 OT Time Calculation (min): 34 min Charges:  OT General Charges $OT Visit: 1 Visit OT Evaluation $OT Eval Moderate Complexity: 1 Mod OT Treatments $Therapeutic Activity: 8-22 mins  Malachy Chamber, OTR/L Acute Rehab Services Office: [phone]   Layla Maw 06/11/2022, 11:23 AM

## 2022-06-11 NOTE — Evaluation (Signed)
Physical Therapy Evaluation Patient Details Name: Maria [name] MRN: [id number] DOB: [birth year] Today's Date: 06/11/2022  History of Present Illness  Pt is a 88 y/o female who presented after mechanical fall at her ILF with prolonged time down on floor. Pt found to have fx of multiple pubic rami (L), L distal radius fx - both treated nonsurgically. Pt also found with chronic fx of T10, L1, L2. PMH: a fib, HTN, CAD, CKD, GERD, macular degeneration  Clinical Impression  Pt admitted with above diagnosis. Pt was able to sit EOB with +2 assist for all aspects of mobility today.  Pt in a lot of pain and fearful of movement. Will need SNF.  Will follow acutely.  Pt currently with functional limitations due to the deficits listed below (see PT Problem List). Pt will benefit from skilled PT to increase their independence and safety with mobility to allow discharge to the venue listed below.          Recommendations for follow up therapy are one component of a multi-disciplinary discharge planning process, led by the attending physician.  Recommendations may be updated based on patient status, additional functional criteria and insurance authorization.  Follow Up Recommendations Skilled nursing-short term rehab (<3 hours/day) Can patient physically be transported by private vehicle: No    Assistance Recommended at Discharge Frequent or constant Supervision/Assistance  Patient can return home with the following  Two people to help with walking and/or transfers;Two people to help with bathing/dressing/bathroom;Assistance with cooking/housework;Direct supervision/assist for medications management;Direct supervision/assist for financial management;Assistance with feeding;Assist for transportation;Help with stairs or ramp for entrance    Equipment Recommendations None recommended by PT  Recommendations for Other Services       Functional Status Assessment Patient has had a recent decline in their  functional status and demonstrates the ability to make significant improvements in function in a reasonable and predictable amount of time.     Precautions / Restrictions Precautions Precautions: Fall;Other (comment) Precaution Comments: back precautions and sling for comfort Required Braces or Orthoses: Sling Restrictions Weight Bearing Restrictions: Yes LUE Weight Bearing: Weight bear through elbow only RLE Weight Bearing: Weight bearing as tolerated LLE Weight Bearing: Weight bearing as tolerated      Mobility  Bed Mobility Overal bed mobility: Needs Assistance Bed Mobility: Rolling, Sidelying to Sit Rolling: Max assist, +2 for physical assistance Sidelying to sit: Total assist, +2 for physical assistance       General bed mobility comments: Pt needed a lot of assist to come to eOB as she couldnt move her LEs on her own and needed assist to elevate trunk.  Had a hard time gettting feet on floor once up with pt maintaining knee extension bilateraly initially upon sitting.    Transfers Overall transfer level: Needs assistance Equipment used: 1 person hand held assist (gait belt and pad) Transfers: Sit to/from Stand Sit to Stand: Mod assist, +2 physical assistance, +2 safety/equipment, Max assist, From elevated surface           General transfer comment: Pt expressed fear to try [place] first time getting up.  Used pad and gait belt and assisted pt to the chair with max to total assist of 2 persons.  Pt did weight bear but not fully.  Pt fearful.    Ambulation/Gait                  Financial trader  [place] (Stroke Patients Only)       Balance Overall balance assessment: Needs assistance Sitting-balance support: Feet supported, Single extremity supported Sitting balance-Leahy Scale: Poor Sitting balance - Comments: Took incr time to obtain balance and was leaning to right quite a bit and needed support   Standing  balance support: Bilateral upper extremity supported, During functional activity Standing balance-Leahy Scale: Poor Standing balance comment: relies on at least 1 UE support and external support                             Pertinent Vitals/Pain Pain Assessment Pain Assessment: Faces Faces Pain Scale: Hurts even more Pain Location: L hip, back Pain Descriptors / Indicators: Aching, Discomfort, Grimacing, Guarding Pain Intervention(s): Limited activity within patient's tolerance, Monitored during session, Repositioned    Home Living Family/patient expects to be discharged to:: Private residence Living Arrangements: Alone Available Help at Discharge: Family;Available PRN/intermittently Type of Home: Independent living facility Home Access: Level entry       Home Layout: One level Home Equipment: Cane - single point;Rollator (4 wheels);Shower seat;Grab bars - toilet;Grab bars - tub/shower;Wheelchair - manual      Prior Function Prior Level of Function : Independent/Modified Independent             Mobility Comments: used rollator at all times per pt ADLs Comments: completed ADLs MOD I, hired cleaning services and facility provided meals     Hand Dominance   Dominant Hand: Right    Extremity/Trunk Assessment   Upper Extremity Assessment Upper Extremity Assessment: Defer to OT evaluation LUE Deficits / Details: bandaged wrist to proximal elbow, digits very swollen, able to extend but increased pain with minimal flexion attempts LUE Coordination: decreased fine motor    Lower Extremity Assessment Lower Extremity Assessment: LLE deficits/detail LLE: Unable to fully assess due to pain    Cervical / Trunk Assessment Cervical / Trunk Assessment: Kyphotic;Other exceptions Cervical / Trunk Exceptions: chronic compression fx T10, L1, L2  Communication   Communication: HOH  Cognition Arousal/Alertness: Awake/alert Behavior During Therapy: WFL for tasks  assessed/performed Overall Cognitive Status: Within Functional Limits for tasks assessed                                          General Comments General comments (skin integrity, edema, etc.): Son present and hands on to assist. daughter entering during session. Son with questions regarding fx, plans - provided general education from orthopedics note on fx locations, [facility] recs    Exercises General Exercises - Lower Extremity Ankle Circles/Pumps: AROM, Both, 5 reps, Supine Long Arc Quad: AROM, Both, 5 reps, Seated Heel Slides: AAROM, Both, 5 reps, Supine   Assessment/Plan    PT Assessment Patient needs continued PT services  PT Problem List Decreased activity tolerance;Decreased balance;Decreased mobility;Decreased strength;Decreased range of motion;Decreased knowledge of use of DME;Decreased safety awareness;Decreased knowledge of precautions;Pain       PT Treatment Interventions DME instruction;Gait training;Functional mobility training;Therapeutic activities;Therapeutic exercise;Balance training;Patient/family education    PT Goals (Current goals can be found in the Care Plan section)  Acute Rehab PT Goals Patient Stated Goal: to get better PT Goal Formulation: With patient Time For Goal Achievement: 06/25/22 Potential to Achieve Goals: Fair    Frequency Min 3X/week     Co-evaluation  AM-PAC PT "6 Clicks" Mobility  Outcome Measure Help needed turning from your back to your side while in a flat bed without using bedrails?: Total Help needed moving from lying on your back to sitting on the side of a flat bed without using bedrails?: Total Help needed moving to and from a bed to a chair (including a wheelchair)?: Total Help needed standing up from a chair using your arms (e.g., wheelchair or bedside chair)?: Total Help needed to walk in hospital room?: Total Help needed climbing 3-5 steps with a railing? : Total 6 Click Score: 6    End  of Session Equipment Utilized During Treatment: Gait belt Activity Tolerance: Patient limited by fatigue;Patient limited by pain Patient left: in chair;with call bell/phone within reach;with chair alarm set Nurse Communication: Mobility status;Need for lift equipment (Maxi move pad in chair) PT Visit Diagnosis: Unsteadiness on feet (R26.81);Muscle weakness (generalized) (M62.81);Pain Pain - Right/Left: Left Pain - part of body: Leg;Arm    Time: 0223-3612 PT Time Calculation (min) (ACUTE ONLY): 29 min   Charges:   PT Evaluation $PT Eval Moderate Complexity: 1 Mod PT Treatments $Therapeutic Activity: 8-22 mins        [facility] M,PT Acute Rehab Services [phone]   Alvira Philips 06/11/2022, 2:07 PM

## 2022-06-11 NOTE — Progress Notes (Signed)
Initial Nutrition Assessment  DOCUMENTATION CODES:   Severe malnutrition in context of chronic illness  INTERVENTION:   Boost Breeze po TID, each supplement provides 250 kcal and 9 grams of protein Per pt request  Gluten free diet; honor pt food preferences    NUTRITION DIAGNOSIS:   Severe Malnutrition related to chronic illness as evidenced by severe fat depletion, severe muscle depletion.  GOAL:   Patient will meet greater than or equal to 90% of their needs  MONITOR:   PO intake, Supplement acceptance  REASON FOR ASSESSMENT:   Consult Assessment of nutrition requirement/status  ASSESSMENT:   Pt with PMH of celiac dz since 88 years old, PAF, stroke on chronic Eliquis, HTN, CHF who lives in independent living cottage at [place] now admitted after fall and hip fx.   Spoke with pt and daughter. Pt aware of items she can eat from kitchen. Willing to drink [employer].  Reports weight loss.   Medications reviewed and include: lasix  Labs reviewed    NUTRITION - FOCUSED PHYSICAL EXAM:  Flowsheet Row Most Recent Value  Orbital Region Severe depletion  Upper Arm Region Severe depletion  Thoracic and Lumbar Region Severe depletion  Buccal Region Severe depletion  Temple Region Severe depletion  Clavicle Bone Region Severe depletion  Clavicle and Acromion Bone Region Severe depletion  Scapular Bone Region Severe depletion  Dorsal Hand Severe depletion  Patellar Region Severe depletion  Anterior Thigh Region Severe depletion  Posterior Calf Region Severe depletion  Edema (RD Assessment) None  Hair Reviewed  Eyes Reviewed  Mouth Reviewed  Skin Reviewed  Nails Reviewed       Diet Order:   Diet Order             Diet gluten free Room service appropriate? Yes; Fluid consistency: Thin  Diet effective now                   EDUCATION NEEDS:   Education needs have been addressed  Skin:  Skin Assessment: Reviewed RN Assessment  Last BM:   8/17  Height:   Ht Readings from Last 1 Encounters:  06/10/22 5' (1.524 m)    Weight:   Wt Readings from Last 1 Encounters:  06/10/22 41.7 kg    BMI:  Body mass index is 17.97 kg/m.  Estimated Nutritional Needs:   Kcal:  1300-1500  Protein:  65-75 grams  Fluid:  >1.5 L/day  Lockie Pares., RD, LDN, CNSC See AMiON for contact information

## 2022-06-11 NOTE — Care Management Obs Status (Signed)
MEDICARE OBSERVATION STATUS NOTIFICATION   Patient Details  Name: Maria [name] MRN: [id number] Date of Birth: Sep 18, 1924   Medicare Observation Status Notification Given:  Yes    Joanne Chars, LCSW 06/11/2022, 3:52 PM

## 2022-06-12 ENCOUNTER — Observation Stay (HOSPITAL_COMMUNITY): Payer: Medicare Other

## 2022-06-12 DIAGNOSIS — K219 Gastro-esophageal reflux disease without esophagitis: Secondary | ICD-10-CM | POA: Diagnosis not present

## 2022-06-12 DIAGNOSIS — I340 Nonrheumatic mitral (valve) insufficiency: Secondary | ICD-10-CM | POA: Diagnosis present

## 2022-06-12 DIAGNOSIS — N1831 Chronic kidney disease, stage 3a: Secondary | ICD-10-CM | POA: Diagnosis not present

## 2022-06-12 DIAGNOSIS — Z8673 Personal history of transient ischemic attack (TIA), and cerebral infarction without residual deficits: Secondary | ICD-10-CM | POA: Diagnosis not present

## 2022-06-12 DIAGNOSIS — I480 Paroxysmal atrial fibrillation: Secondary | ICD-10-CM | POA: Diagnosis present

## 2022-06-12 DIAGNOSIS — K900 Celiac disease: Secondary | ICD-10-CM | POA: Diagnosis not present

## 2022-06-12 DIAGNOSIS — Z681 Body mass index (BMI) 19 or less, adult: Secondary | ICD-10-CM | POA: Diagnosis not present

## 2022-06-12 DIAGNOSIS — R197 Diarrhea, unspecified: Secondary | ICD-10-CM | POA: Diagnosis not present

## 2022-06-12 DIAGNOSIS — M109 Gout, unspecified: Secondary | ICD-10-CM | POA: Diagnosis not present

## 2022-06-12 DIAGNOSIS — E222 Syndrome of inappropriate secretion of antidiuretic hormone: Secondary | ICD-10-CM | POA: Diagnosis present

## 2022-06-12 DIAGNOSIS — S32592A Other specified fracture of left pubis, initial encounter for closed fracture: Secondary | ICD-10-CM | POA: Diagnosis present

## 2022-06-12 DIAGNOSIS — Z888 Allergy status to other drugs, medicaments and biological substances status: Secondary | ICD-10-CM | POA: Diagnosis not present

## 2022-06-12 DIAGNOSIS — Z87440 Personal history of urinary (tract) infections: Secondary | ICD-10-CM | POA: Diagnosis not present

## 2022-06-12 DIAGNOSIS — E039 Hypothyroidism, unspecified: Secondary | ICD-10-CM | POA: Diagnosis present

## 2022-06-12 DIAGNOSIS — Z880 Allergy status to penicillin: Secondary | ICD-10-CM | POA: Diagnosis not present

## 2022-06-12 DIAGNOSIS — L259 Unspecified contact dermatitis, unspecified cause: Secondary | ICD-10-CM | POA: Diagnosis not present

## 2022-06-12 DIAGNOSIS — Z66 Do not resuscitate: Secondary | ICD-10-CM | POA: Diagnosis present

## 2022-06-12 DIAGNOSIS — S32592D Other specified fracture of left pubis, subsequent encounter for fracture with routine healing: Secondary | ICD-10-CM | POA: Diagnosis not present

## 2022-06-12 DIAGNOSIS — K5900 Constipation, unspecified: Secondary | ICD-10-CM | POA: Diagnosis present

## 2022-06-12 DIAGNOSIS — J811 Chronic pulmonary edema: Secondary | ICD-10-CM | POA: Diagnosis not present

## 2022-06-12 DIAGNOSIS — Y92002 Bathroom of unspecified non-institutional (private) residence single-family (private) house as the place of occurrence of the external cause: Secondary | ICD-10-CM | POA: Diagnosis not present

## 2022-06-12 DIAGNOSIS — R1314 Dysphagia, pharyngoesophageal phase: Secondary | ICD-10-CM | POA: Diagnosis not present

## 2022-06-12 DIAGNOSIS — R0902 Hypoxemia: Secondary | ICD-10-CM | POA: Diagnosis present

## 2022-06-12 DIAGNOSIS — J90 Pleural effusion, not elsewhere classified: Secondary | ICD-10-CM | POA: Diagnosis not present

## 2022-06-12 DIAGNOSIS — F32A Depression, unspecified: Secondary | ICD-10-CM | POA: Diagnosis present

## 2022-06-12 DIAGNOSIS — I440 Atrioventricular block, first degree: Secondary | ICD-10-CM | POA: Diagnosis present

## 2022-06-12 DIAGNOSIS — R531 Weakness: Secondary | ICD-10-CM | POA: Diagnosis not present

## 2022-06-12 DIAGNOSIS — I2510 Atherosclerotic heart disease of native coronary artery without angina pectoris: Secondary | ICD-10-CM | POA: Diagnosis not present

## 2022-06-12 DIAGNOSIS — M6281 Muscle weakness (generalized): Secondary | ICD-10-CM | POA: Diagnosis not present

## 2022-06-12 DIAGNOSIS — R262 Difficulty in walking, not elsewhere classified: Secondary | ICD-10-CM | POA: Diagnosis not present

## 2022-06-12 DIAGNOSIS — R7989 Other specified abnormal findings of blood chemistry: Secondary | ICD-10-CM | POA: Diagnosis present

## 2022-06-12 DIAGNOSIS — Z743 Need for continuous supervision: Secondary | ICD-10-CM | POA: Diagnosis not present

## 2022-06-12 DIAGNOSIS — S52502D Unspecified fracture of the lower end of left radius, subsequent encounter for closed fracture with routine healing: Secondary | ICD-10-CM | POA: Diagnosis not present

## 2022-06-12 DIAGNOSIS — I5032 Chronic diastolic (congestive) heart failure: Secondary | ICD-10-CM | POA: Diagnosis present

## 2022-06-12 DIAGNOSIS — R339 Retention of urine, unspecified: Secondary | ICD-10-CM | POA: Diagnosis present

## 2022-06-12 DIAGNOSIS — W19XXXA Unspecified fall, initial encounter: Secondary | ICD-10-CM | POA: Diagnosis not present

## 2022-06-12 DIAGNOSIS — E43 Unspecified severe protein-calorie malnutrition: Secondary | ICD-10-CM | POA: Diagnosis present

## 2022-06-12 DIAGNOSIS — W1830XA Fall on same level, unspecified, initial encounter: Secondary | ICD-10-CM | POA: Diagnosis present

## 2022-06-12 DIAGNOSIS — I130 Hypertensive heart and chronic kidney disease with heart failure and stage 1 through stage 4 chronic kidney disease, or unspecified chronic kidney disease: Secondary | ICD-10-CM | POA: Diagnosis present

## 2022-06-12 DIAGNOSIS — I10 Essential (primary) hypertension: Secondary | ICD-10-CM | POA: Diagnosis not present

## 2022-06-12 DIAGNOSIS — R41841 Cognitive communication deficit: Secondary | ICD-10-CM | POA: Diagnosis not present

## 2022-06-12 DIAGNOSIS — R110 Nausea: Secondary | ICD-10-CM | POA: Diagnosis not present

## 2022-06-12 DIAGNOSIS — Z1152 Encounter for screening for COVID-19: Secondary | ICD-10-CM | POA: Diagnosis not present

## 2022-06-12 DIAGNOSIS — M25552 Pain in left hip: Secondary | ICD-10-CM | POA: Diagnosis not present

## 2022-06-12 DIAGNOSIS — R3989 Other symptoms and signs involving the genitourinary system: Secondary | ICD-10-CM | POA: Diagnosis not present

## 2022-06-12 DIAGNOSIS — N183 Chronic kidney disease, stage 3 unspecified: Secondary | ICD-10-CM | POA: Diagnosis present

## 2022-06-12 DIAGNOSIS — S52592A Other fractures of lower end of left radius, initial encounter for closed fracture: Secondary | ICD-10-CM | POA: Diagnosis present

## 2022-06-12 DIAGNOSIS — S52502A Unspecified fracture of the lower end of left radius, initial encounter for closed fracture: Secondary | ICD-10-CM | POA: Diagnosis not present

## 2022-06-12 DIAGNOSIS — Z885 Allergy status to narcotic agent status: Secondary | ICD-10-CM | POA: Diagnosis not present

## 2022-06-12 DIAGNOSIS — I272 Pulmonary hypertension, unspecified: Secondary | ICD-10-CM | POA: Diagnosis present

## 2022-06-12 DIAGNOSIS — J439 Emphysema, unspecified: Secondary | ICD-10-CM | POA: Diagnosis not present

## 2022-06-12 DIAGNOSIS — J9811 Atelectasis: Secondary | ICD-10-CM | POA: Diagnosis present

## 2022-06-12 LAB — BASIC METABOLIC PANEL
Anion gap: 8 (ref 5–15)
BUN: 23 mg/dL (ref 8–23)
CO2: 21 mmol/L — ABNORMAL LOW (ref 22–32)
Calcium: 8.5 mg/dL — ABNORMAL LOW (ref 8.9–10.3)
Chloride: 99 mmol/L (ref 98–111)
Creatinine, Ser: 0.88 mg/dL (ref 0.44–1.00)
GFR, Estimated: >60 mL/min (ref >60)
Glucose, Bld: 109 mg/dL — ABNORMAL HIGH (ref 70–99)
Potassium: 3.8 mmol/L (ref 3.5–5.1)
Sodium: 128 mmol/L — ABNORMAL LOW (ref 135–145)

## 2022-06-12 LAB — CBC
HCT: 34.8 % — ABNORMAL LOW (ref 36.0–46.0)
Hemoglobin: 11.9 g/dL — ABNORMAL LOW (ref 12.0–15.0)
MCH: 31.2 pg (ref 26.0–34.0)
MCHC: 34.2 g/dL (ref 30.0–36.0)
MCV: 91.1 fL (ref 80.0–100.0)
Platelets: 250 10*3/uL (ref 150–400)
RBC: 3.82 MIL/uL — ABNORMAL LOW (ref 3.87–5.11)
RDW: 15.2 % (ref 11.5–15.5)
WBC: 8.8 10*3/uL (ref 4.0–10.5)
nRBC: 0 % (ref 0.0–0.2)

## 2022-06-12 MED ORDER — ONDANSETRON HCL 4 MG/2ML IJ SOLN
4.0000 mg | Freq: Four times a day (QID) | INTRAMUSCULAR | Status: DC | PRN
  Administered 2022-06-12 – 2022-06-14 (×2): 4 mg via INTRAVENOUS
  Filled 2022-06-12 (×2): qty 2

## 2022-06-12 MED ORDER — SODIUM CHLORIDE 0.9 % IV BOLUS: 250.0000 mL | Freq: Once | INTRAVENOUS | Status: DC

## 2022-06-12 MED ORDER — CHOLECALCIFEROL 10 MCG (400 UNIT) PO TABS
400.0000 [IU] | ORAL_TABLET | Freq: Every day | ORAL | Status: DC
  Administered 2022-06-12 – 2022-06-17 (×6): 400 [IU] via ORAL
  Filled 2022-06-12 (×6): qty 1

## 2022-06-12 NOTE — Assessment & Plan Note (Addendum)
Per patient, this is [place] chronic issue. Baseline sodium appears to be around 130. Lasix held. Sodium dropped to as low as 125. Possibly SIADH in setting of significant pain. Urine studies with low sodium. NS IV fluids started with some improvement -Continue NS IV fluids @ 50 mL/hr -BMP in AM -Hold Lasix

## 2022-06-12 NOTE — Discharge Instructions (Signed)

## 2022-06-12 NOTE — Plan of Care (Signed)

## 2022-06-12 NOTE — Hospital Course (Signed)
Maria [name] is Maria [name] 88 y.o. female with Maria [name] history of PAF, stroke, hypertension, diastolic heart failure. Patient presented after Maria [name] fall, suffering Maria [name] left wrist fracture and pelvic fracture. Orthopedic surgery consulted and recommended nonoperative management.

## 2022-06-12 NOTE — Assessment & Plan Note (Signed)
Uncertain etiology. Chest x-ray with some vascular congestion. No pulmonary edema. -Wean as able

## 2022-06-12 NOTE — Progress Notes (Addendum)
PROGRESS NOTE    Maria [name]  GYI:[id number] DOB: 01-Aug-1924 DOA: 06/10/2022 PCP: [name], MD   Brief Narrative: Maria [name] is a 88 y.o. female with a history of PAF, stroke, hypertension, diastolic heart failure. Patient presented after a fall, suffering a left wrist fracture and pelvic fracture. Orthopedic surgery consulted and recommended nonoperative management.   Assessment and Plan: * Fracture of multiple pubic rami, left, closed, initial encounter [facility]) Orthopedic surgery consulted. Recommendations for weight bearing as tolerated. PT/OT consulted with recommendations for SNF. -Continue Norco PO for pain; morphine IV for breakthrough -Mobility as tolerated  Fracture of distal end of left radius Currently with wrist splint. Nonoperative management. Left hand swelling is improved from yesterday. No evidence of hand fracture on previous x-ray.  Pulmonary hypertension, moderate to severe (HCC) -Continue home Lasix  Paroxysmal atrial fibrillation (HCC) Hemoglobin normal and stable. -Continue home Cardizem, amiodarone and Eliquis  Hypoxia Uncertain etiology. Chest x-ray with some vascular congestion. No pulmonary edema. -Wean as able  Hypothyroidism -Continue Synthroid 25 mcg daily  Hyponatremia Per patient, this is a chronic issue. Baseline sodium appears to be around 130. Down to 128 today. -Hold lasix -BMP in AM  Depression -Continue Zoloft  Essential hypertension, benign -Continue diltiazem    DVT prophylaxis: Eliquis Code Status:   Code Status: DNR Family Communication: Son at bedside Disposition Plan: Discharge to SNF likely in 2-3 days pending bed availability   Consultants:  Orthopedic surgery  Procedures:  None  Antimicrobials: None    Subjective: Some continued pain which is manageable with analgesics.  Objective: BP 113/61 (BP Location: Right Arm)   Pulse 71   Temp 97.9 F (36.6 C) (Oral)   Resp 18   Ht 5' (1.524  m)   Wt 41.7 kg   SpO2 97%   BMI 17.97 kg/m   Examination:  General exam: Appears calm and comfortable Respiratory system: Clear to auscultation. Respiratory effort normal. Cardiovascular system: S1 & S2 heard, RRR.  Gastrointestinal system: Abdomen is nondistended, soft and nontender. No organomegaly or masses felt. Normal bowel sounds heard. Central nervous system: Alert and oriented. No focal neurological deficits. Musculoskeletal: Left wrist splint. Still with some swelling of dorsum of left hand Psychiatry: Judgement and insight appear normal. Mood & affect appropriate.    Data Reviewed: I have personally reviewed following labs and imaging studies  CBC Lab Results  Component Value Date   WBC 8.8 06/12/2022   RBC 3.82 (L) 06/12/2022   HGB 11.9 (L) 06/12/2022   HCT 34.8 (L) 06/12/2022   MCV 91.1 06/12/2022   MCH 31.2 06/12/2022   PLT 250 06/12/2022   MCHC 34.2 06/12/2022   RDW 15.2 06/12/2022   LYMPHSABS 0.3 (L) 06/10/2022   MONOABS 0.4 06/10/2022   EOSABS 0.0 06/10/2022   BASOSABS 0.0 35/00/9381     Last metabolic panel Lab Results  Component Value Date   NA 128 (L) 06/12/2022   K 3.8 06/12/2022   CL 99 06/12/2022   CO2 21 (L) 06/12/2022   BUN 23 06/12/2022   CREATININE 0.88 06/12/2022   GLUCOSE 109 (H) 06/12/2022   GFRNONAA >60 06/12/2022   GFRAA >60 11/30/2018   CALCIUM 8.5 (L) 06/12/2022   PROT 6.8 06/10/2022   ALBUMIN 3.4 (L) 06/10/2022   LABGLOB 3.0 10/02/2021   AGRATIO 1.3 10/02/2021   BILITOT 0.8 06/10/2022   ALKPHOS 74 06/10/2022   AST 48 (H) 06/10/2022   ALT 30 06/10/2022   ANIONGAP 8 06/12/2022  GFR: Estimated Creatinine Clearance: 24.6 mL/min (by C-G formula based on SCr of 0.88 mg/dL).  No results found for this or any previous visit (from the past 240 hour(s)).    Radiology Studies: DG CHEST PORT 1 VIEW  Result Date: 06/12/2022 CLINICAL DATA:  Hypoxia EXAM: PORTABLE CHEST 1 VIEW COMPARISON:  06/10/2022 FINDINGS: Patient is  rotated towards the left. Bilateral mild interstitial thickening. Trace bilateral pleural effusions. No focal consolidation. No pneumothorax. Stable cardiomegaly. Prior CABG. No acute osseous abnormality. IMPRESSION: 1. Cardiomegaly with mild pulmonary vascular congestion. Electronically Signed   By: Kathreen Devoid M.D.   On: 06/12/2022 11:01   DG Lumbar Spine 2-3 Views  Result Date: 06/11/2022 CLINICAL DATA:  Fall, low back pain. EXAM: LUMBAR SPINE - 2-3 VIEW COMPARISON:  Plain film of the lumbar spine dated 01/05/2017. CT abdomen and pelvis dated 08/30/2018. FINDINGS: Chronic levoscoliosis of the lumbar spine, moderate to severe in degree and grossly stable in alignment compared to the earlier CT abdomen/pelvis of 08/30/2018. Osteopenia limits characterization of osseous detail, however, there is no fracture line or displaced fracture fragment identified. Chronic compression deformities of the L1 and L2 vertebral bodies, not significantly changed compared to the previous plain film of 01/05/2017. Visualized portions of the upper osseous pelvis appear intact and within normal limits in alignment. RIGHT hip arthroplasty hardware is partially imaged. LEFT hip fixation screws are partially imaged. IMPRESSION: 1. No acute findings. No acute fracture or dislocation seen. 2. Chronic compression deformities of the L1 and L2 vertebral bodies, not significantly changed compared to previous plain film of 01/05/2017. 3. Chronic levoscoliosis of the lumbar spine, moderate to severe in degree and grossly stable compared to the earlier CT abdomen/pelvis of 08/30/2018. Electronically Signed   By: Franki Cabot M.D.   On: 06/11/2022 09:37   CT Head Wo Contrast  Result Date: 06/10/2022 CLINICAL DATA:  injuries from a fall. They report pt fell at 6am and has been lying in the floor all day. Pt reports injury to L wrist, L hip, and whole back. Pt is A/Ox4 on arrival. EMS has C-collar and SAM splint to L wrist. EXAM: CT HEAD  WITHOUT CONTRAST CT CERVICAL SPINE WITHOUT CONTRAST TECHNIQUE: Multidetector CT imaging of the head and cervical spine was performed following the standard protocol without intravenous contrast. Multiplanar CT image reconstructions of the cervical spine were also generated. RADIATION DOSE REDUCTION: This exam was performed according to the departmental dose-optimization program which includes automated exposure control, adjustment of the mA and/or kV according to patient size and/or use of iterative reconstruction technique. COMPARISON:  None Available. FINDINGS: CT HEAD FINDINGS BRAIN: BRAIN Cerebral ventricle sizes are concordant with the degree of cerebral volume loss. Patchy and confluent areas of decreased attenuation are noted throughout the deep and periventricular white matter of the cerebral hemispheres bilaterally, compatible with chronic microvascular ischemic disease. No evidence of large-territorial acute infarction. No parenchymal hemorrhage. No mass lesion. No extra-axial collection. No mass effect or midline shift. No hydrocephalus. Basilar cisterns are patent. Vascular: No hyperdense vessel. Skull: No acute fracture or focal lesion. Sinuses/Orbits: Paranasal sinuses and mastoid air cells are clear. Bilateral lens replacement. Otherwise the orbits are unremarkable. Other: None. CT CERVICAL SPINE FINDINGS Alignment: Normal. Skull base and vertebrae: Multilevel moderate degenerative changes of the spine. Associated multilevel moderate to severe degenerative changes spine. No acute fracture. No aggressive appearing focal osseous lesion or focal pathologic process. Soft tissues and spinal canal: No prevertebral fluid or swelling. No visible canal hematoma. Upper chest:  Biapical pleural/pulmonary scarring. Other: None. IMPRESSION: 1. No acute intracranial abnormality. 2. No acute displaced fracture or traumatic listhesis of the cervical spine. Electronically Signed   By: Iven Finn M.D.   On:  06/10/2022 17:36   CT Cervical Spine Wo Contrast  Result Date: 06/10/2022 CLINICAL DATA:  injuries from a fall. They report pt fell at 6am and has been lying in the floor all day. Pt reports injury to L wrist, L hip, and whole back. Pt is A/Ox4 on arrival. EMS has C-collar and SAM splint to L wrist. EXAM: CT HEAD WITHOUT CONTRAST CT CERVICAL SPINE WITHOUT CONTRAST TECHNIQUE: Multidetector CT imaging of the head and cervical spine was performed following the standard protocol without intravenous contrast. Multiplanar CT image reconstructions of the cervical spine were also generated. RADIATION DOSE REDUCTION: This exam was performed according to the departmental dose-optimization program which includes automated exposure control, adjustment of the mA and/or kV according to patient size and/or use of iterative reconstruction technique. COMPARISON:  None Available. FINDINGS: CT HEAD FINDINGS BRAIN: BRAIN Cerebral ventricle sizes are concordant with the degree of cerebral volume loss. Patchy and confluent areas of decreased attenuation are noted throughout the deep and periventricular white matter of the cerebral hemispheres bilaterally, compatible with chronic microvascular ischemic disease. No evidence of large-territorial acute infarction. No parenchymal hemorrhage. No mass lesion. No extra-axial collection. No mass effect or midline shift. No hydrocephalus. Basilar cisterns are patent. Vascular: No hyperdense vessel. Skull: No acute fracture or focal lesion. Sinuses/Orbits: Paranasal sinuses and mastoid air cells are clear. Bilateral lens replacement. Otherwise the orbits are unremarkable. Other: None. CT CERVICAL SPINE FINDINGS Alignment: Normal. Skull base and vertebrae: Multilevel moderate degenerative changes of the spine. Associated multilevel moderate to severe degenerative changes spine. No acute fracture. No aggressive appearing focal osseous lesion or focal pathologic process. Soft tissues and spinal  canal: No prevertebral fluid or swelling. No visible canal hematoma. Upper chest: Biapical pleural/pulmonary scarring. Other: None. IMPRESSION: 1. No acute intracranial abnormality. 2. No acute displaced fracture or traumatic listhesis of the cervical spine. Electronically Signed   By: Iven Finn M.D.   On: 06/10/2022 17:36   DG Hip Unilat With Pelvis 2-3 Views Left  Result Date: 06/10/2022 CLINICAL DATA:  Fall with left hip pain. EXAM: DG HIP (WITH OR WITHOUT PELVIS) 2-3V LEFT COMPARISON:  03/30/2021 FINDINGS: Stable appearance of previously placed cannulated pins in the proximal left femur across the femoral neck and into the femoral head. No evidence of acute fracture or dislocation. No abnormal lucency. The visualized bony pelvis appears intact. IMPRESSION: Stable appearance of previously placed left femoral pins. No evidence of acute fracture. Electronically Signed   By: Aletta Edouard M.D.   On: 06/10/2022 17:17   DG Forearm Left  Result Date: 06/10/2022 CLINICAL DATA:  Fall with left wrist pain. EXAM: LEFT FOREARM - 2 VIEW COMPARISON:  Left wrist films today. FINDINGS: See left wrist radiographs for description of a distal radial fracture which is also visible on the forearm radiographs and demonstrates minimal displacement. No additional radial or ulnar fractures identified with normal alignment present at the level of the elbow. IMPRESSION: Minimally displaced distal radial fracture again noted which is described on the left wrist radiograph report. No proximal radial or ulnar injury identified. Electronically Signed   By: Aletta Edouard M.D.   On: 06/10/2022 17:14   DG Wrist Complete Left  Result Date: 06/10/2022 CLINICAL DATA:  Fall with left wrist pain. EXAM: LEFT WRIST - COMPLETE 3+  VIEW COMPARISON:  None Available. FINDINGS: Acute fracture of the distal radius present through the region of the radial styloid that demonstrates no significant displacement. Bones are diffusely  osteopenic. Carpal bones demonstrate normal alignment. Mild degenerative disease of the radiocarpal joint and carpal bones. IMPRESSION: Acute fracture of the distal left radius at the base of the radial styloid that demonstrates no significant displacement. Electronically Signed   By: Aletta Edouard M.D.   On: 06/10/2022 17:13   DG Pelvis Portable  Result Date: 06/10/2022 CLINICAL DATA:  Post fall with LEFT hip pain. EXAM: PORTABLE PELVIS 1-2 VIEWS COMPARISON:  March 30, 2021 FINDINGS: Minimally displaced fracture of the superior pubic ramus on the LEFT and possible nondisplaced fracture of the inferior pubic ramus on the LEFT. Osteopenia without additional visible signs of fracture. Post RIGHT hip arthroplasty and prior ORIF of LEFT hip fracture. IMPRESSION: Minimally displaced fracture of the superior pubic ramus on the LEFT and possible nondisplaced fracture of the LEFT inferior pubic ramus. No additional fractures are seen though dedicated views of the hip are not provided in this patient reportedly with LEFT hip pain. Electronically Signed   By: Zetta Bills M.D.   On: 06/10/2022 15:57   DG Chest Port 1 View  Result Date: 06/10/2022 CLINICAL DATA:  Fall with left-sided arm and hip pain. EXAM: PORTABLE CHEST 1 VIEW COMPARISON:  03/28/2021 FINDINGS: Midline trachea. Mild cardiomegaly. Atherosclerosis in the transverse aorta. Median sternotomy for CABG and left atrial appendage occlusion. Trace left pleural fluid or thickening, decreased compared to the prior. No pneumothorax. No congestive failure. Mild left base scarring. Mild right hemidiaphragm elevation. Convex left thoracolumbar spine curvature. IMPRESSION: No acute cardiopulmonary disease. Decreased trace left pleural fluid or thickening. Cardiomegaly without congestive failure. Aortic Atherosclerosis (ICD10-I70.0). Electronically Signed   By: Abigail Miyamoto M.D.   On: 06/10/2022 15:57      LOS: 0 days    Cordelia Poche, MD [facility] 06/12/2022, 3:23 PM   If 7PM-7AM, please contact night-coverage [email or web address]

## 2022-06-12 NOTE — Progress Notes (Signed)
Patient c/o nausea, provider paged, given zofran PRN. No relief from nausea. Attempted to try and get patient to eat. Patient took sips of boost. Patient OOB to chair and back to bed with lift equipment.

## 2022-06-13 DIAGNOSIS — I480 Paroxysmal atrial fibrillation: Secondary | ICD-10-CM | POA: Diagnosis not present

## 2022-06-13 DIAGNOSIS — E039 Hypothyroidism, unspecified: Secondary | ICD-10-CM | POA: Diagnosis not present

## 2022-06-13 DIAGNOSIS — S32592A Other specified fracture of left pubis, initial encounter for closed fracture: Secondary | ICD-10-CM | POA: Diagnosis not present

## 2022-06-13 DIAGNOSIS — I10 Essential (primary) hypertension: Secondary | ICD-10-CM | POA: Diagnosis not present

## 2022-06-13 LAB — BASIC METABOLIC PANEL
Anion gap: 9 (ref 5–15)
BUN: 32 mg/dL — ABNORMAL HIGH (ref 8–23)
CO2: 21 mmol/L — ABNORMAL LOW (ref 22–32)
Calcium: 8.4 mg/dL — ABNORMAL LOW (ref 8.9–10.3)
Chloride: 97 mmol/L — ABNORMAL LOW (ref 98–111)
Creatinine, Ser: 1.07 mg/dL — ABNORMAL HIGH (ref 0.44–1.00)
GFR, Estimated: 48 mL/min — ABNORMAL LOW (ref >60)
Glucose, Bld: 112 mg/dL — ABNORMAL HIGH (ref 70–99)
Potassium: 3.9 mmol/L (ref 3.5–5.1)
Sodium: 127 mmol/L — ABNORMAL LOW (ref 135–145)

## 2022-06-13 MED ORDER — TRAMADOL HCL 50 MG PO TABS
50.0000 mg | ORAL_TABLET | Freq: Four times a day (QID) | ORAL | Status: DC | PRN
  Administered 2022-06-14 (×3): 50 mg via ORAL
  Filled 2022-06-13 (×3): qty 1

## 2022-06-13 MED ORDER — ACETAMINOPHEN 325 MG PO TABS
650.0000 mg | ORAL_TABLET | Freq: Four times a day (QID) | ORAL | Status: DC | PRN
  Administered 2022-06-13 – 2022-06-17 (×2): 650 mg via ORAL
  Filled 2022-06-13 (×2): qty 2

## 2022-06-13 NOTE — Progress Notes (Addendum)
PROGRESS NOTE    Maria [name]  ION:[id number] DOB: Mar 22, 1924 DOA: 06/10/2022 PCP: [name], MD   Brief Narrative: Maria [name] is a 88 y.o. female with a history of PAF, stroke, hypertension, diastolic heart failure. Patient presented after a fall, suffering a left wrist fracture and pelvic fracture. Orthopedic surgery consulted and recommended nonoperative management.   Assessment and Plan: * Fracture of multiple pubic rami, left, closed, initial encounter [facility]) Orthopedic surgery consulted. Recommendations for weight bearing as tolerated. PT/OT consulted with recommendations for SNF. -Continue Norco PO for pain; morphine IV for breakthrough -Mobility as tolerated  Fracture of distal end of left radius Currently with wrist splint. Nonoperative management. Left hand swelling is improved from yesterday. No evidence of hand fracture on previous x-ray. Per orthopedic surgery, non-weightbearing of left wrist.  Pulmonary hypertension, moderate to severe (HCC) -Continue home Lasix  Paroxysmal atrial fibrillation (HCC) Hemoglobin normal and stable. -Continue home Cardizem, amiodarone and Eliquis  Hypoxia Uncertain etiology. Chest x-ray with some vascular congestion. No pulmonary edema. -Wean as able  Hypothyroidism -Continue Synthroid 25 mcg daily  Hyponatremia Per patient, this is a chronic issue. Baseline sodium appears to be around 130. Down to 127 today. Lasix held. -BMP in AM  Depression -Continue Zoloft  Essential hypertension, benign -Continue diltiazem    DVT prophylaxis: Eliquis Code Status:   Code Status: DNR Family Communication: Son at bedside Disposition Plan: Discharge to SNF likely in 1-3 days pending bed availability   Consultants:  Orthopedic surgery  Procedures:  None  Antimicrobials: None    Subjective: Continues to have pain with movement. Otherwise, no concerns.  Objective: BP 127/66 (BP Location: Right Arm)   Pulse  69   Temp 97.7 F (36.5 C)   Resp 16   Ht 5' (1.524 m)   Wt 41.7 kg   SpO2 99%   BMI 17.97 kg/m   Examination:  General exam: Appears calm and comfortable Respiratory system: Clear to auscultation. Respiratory effort normal. Cardiovascular system: S1 & S2 heard, RRR. Gastrointestinal system: Abdomen is nondistended, soft and nontender. Normal bowel sounds heard. Central nervous system: Alert and oriented. No focal neurological deficits. Musculoskeletal: LUE edema of dorsum of hand Skin: No cyanosis. No rashes Psychiatry: Judgement and insight appear normal. Mood & affect appropriate.    Data Reviewed: I have personally reviewed following labs and imaging studies  CBC Lab Results  Component Value Date   WBC 8.8 06/12/2022   RBC 3.82 (L) 06/12/2022   HGB 11.9 (L) 06/12/2022   HCT 34.8 (L) 06/12/2022   MCV 91.1 06/12/2022   MCH 31.2 06/12/2022   PLT 250 06/12/2022   MCHC 34.2 06/12/2022   RDW 15.2 06/12/2022   LYMPHSABS 0.3 (L) 06/10/2022   MONOABS 0.4 06/10/2022   EOSABS 0.0 06/10/2022   BASOSABS 0.0 24/40/1027     Last metabolic panel Lab Results  Component Value Date   NA 127 (L) 06/13/2022   K 3.9 06/13/2022   CL 97 (L) 06/13/2022   CO2 21 (L) 06/13/2022   BUN 32 (H) 06/13/2022   CREATININE 1.07 (H) 06/13/2022   GLUCOSE 112 (H) 06/13/2022   GFRNONAA 48 (L) 06/13/2022   GFRAA >60 11/30/2018   CALCIUM 8.4 (L) 06/13/2022   PROT 6.8 06/10/2022   ALBUMIN 3.4 (L) 06/10/2022   LABGLOB 3.0 10/02/2021   AGRATIO 1.3 10/02/2021   BILITOT 0.8 06/10/2022   ALKPHOS 74 06/10/2022   AST 48 (H) 06/10/2022   ALT 30 06/10/2022   ANIONGAP 9  06/13/2022    GFR: Estimated Creatinine Clearance: 20.2 mL/min (A) (by C-G formula based on SCr of 1.07 mg/dL (H)).  No results found for this or any previous visit (from the past 240 hour(s)).    Radiology Studies: DG CHEST PORT 1 VIEW  Result Date: 06/12/2022 CLINICAL DATA:  Hypoxia EXAM: PORTABLE CHEST 1 VIEW COMPARISON:   06/10/2022 FINDINGS: Patient is rotated towards the left. Bilateral mild interstitial thickening. Trace bilateral pleural effusions. No focal consolidation. No pneumothorax. Stable cardiomegaly. Prior CABG. No acute osseous abnormality. IMPRESSION: 1. Cardiomegaly with mild pulmonary vascular congestion. Electronically Signed   By: Kathreen Devoid M.D.   On: 06/12/2022 11:01      LOS: 1 day    Cordelia Poche, MD [facility] 06/13/2022, 1:05 PM   If 7PM-7AM, please contact night-coverage [email or web address]

## 2022-06-13 NOTE — Progress Notes (Signed)
Notified by Tele that pt. is now in 1st degree AVB heart block rhythm. Doc notified no new orders at this time

## 2022-06-13 NOTE — Progress Notes (Signed)
Patient seen and examined, overall doing fair, struggling with nausea.  Pain is currently well controlled as long as she does not move.  On examination she is neurologically intact in her right upper extremity, she does have swelling diffusely around the fingers as would be expected.  According to her family the swelling is at least 10% better than it was yesterday, and 30% better than the day before.  EHL and FHL are intact on both legs.  I have reviewed her x-rays that demonstrate a left-sided pubic ramus fracture and a nondisplaced left distal radius fracture.  Impression: Left distal radius fracture and left pubic ramus fracture, history of left-sided femoral neck pinning, right hip hemiarthroplasty  Plan: I am going to change her pain medication regimen to be Tylenol, with tramadol as backup.  She may be getting nausea from the hydrocodone, she also complains of some dizziness and blurred vision, which can also be associated.  Okay to weight-bear as tolerated bilateral lower extremities, left upper extremity can be weightbearing as tolerated in a platform crutch through the elbow, nonweightbearing left wrist.  She apparently has a bed at [facility], and may be ready for discharge once her nausea and and pain is under control.  Possibly tomorrow.  I would defer to the primary team, appreciate their help.  I reviewed the x-rays, the clinical plan of care, and discussed in detail with the patient and the family.  Marchia Bond, MD

## 2022-06-14 DIAGNOSIS — K5900 Constipation, unspecified: Secondary | ICD-10-CM

## 2022-06-14 DIAGNOSIS — S32592A Other specified fracture of left pubis, initial encounter for closed fracture: Secondary | ICD-10-CM | POA: Diagnosis not present

## 2022-06-14 DIAGNOSIS — E039 Hypothyroidism, unspecified: Secondary | ICD-10-CM | POA: Diagnosis not present

## 2022-06-14 DIAGNOSIS — I480 Paroxysmal atrial fibrillation: Secondary | ICD-10-CM | POA: Diagnosis not present

## 2022-06-14 DIAGNOSIS — I440 Atrioventricular block, first degree: Secondary | ICD-10-CM

## 2022-06-14 DIAGNOSIS — I10 Essential (primary) hypertension: Secondary | ICD-10-CM | POA: Diagnosis not present

## 2022-06-14 LAB — BASIC METABOLIC PANEL
Anion gap: 8 (ref 5–15)
BUN: 32 mg/dL — ABNORMAL HIGH (ref 8–23)
CO2: 22 mmol/L (ref 22–32)
Calcium: 8.4 mg/dL — ABNORMAL LOW (ref 8.9–10.3)
Chloride: 95 mmol/L — ABNORMAL LOW (ref 98–111)
Creatinine, Ser: 0.82 mg/dL (ref 0.44–1.00)
GFR, Estimated: >60 mL/min (ref >60)
Glucose, Bld: 112 mg/dL — ABNORMAL HIGH (ref 70–99)
Potassium: 3.9 mmol/L (ref 3.5–5.1)
Sodium: 125 mmol/L — ABNORMAL LOW (ref 135–145)

## 2022-06-14 LAB — URINALYSIS, ROUTINE W REFLEX MICROSCOPIC
Bilirubin Urine: NEGATIVE
Glucose, UA: NEGATIVE mg/dL
Hgb urine dipstick: NEGATIVE
Ketones, ur: NEGATIVE mg/dL
Leukocytes,Ua: NEGATIVE
Nitrite: NEGATIVE
Protein, ur: NEGATIVE mg/dL
Specific Gravity, Urine: 1.014 (ref 1.005–1.030)
pH: 5 (ref 5.0–8.0)

## 2022-06-14 LAB — OSMOLALITY, URINE: Osmolality, Ur: 467 mOsm/kg (ref 300–900)

## 2022-06-14 LAB — BRAIN NATRIURETIC PEPTIDE: B Natriuretic Peptide: 276 pg/mL — ABNORMAL HIGH (ref 0.0–100.0)

## 2022-06-14 LAB — SODIUM, URINE, RANDOM: Sodium, Ur: 14 mmol/L

## 2022-06-14 MED ORDER — ONDANSETRON HCL 4 MG/2ML IJ SOLN: 4.0000 mg | Freq: Four times a day (QID) | INTRAMUSCULAR | Status: DC | PRN

## 2022-06-14 MED ORDER — SODIUM CHLORIDE 0.9 % IV SOLN: INTRAVENOUS | Status: DC

## 2022-06-14 MED ORDER — BISACODYL 10 MG RE SUPP
10.0000 mg | Freq: Once | RECTAL | Status: AC
  Administered 2022-06-14: 10 mg via RECTAL
  Filled 2022-06-14: qty 1

## 2022-06-14 MED ORDER — SODIUM CHLORIDE 0.9 % IV SOLN: INTRAVENOUS | Status: AC

## 2022-06-14 MED ORDER — ONDANSETRON 4 MG PO TBDP
4.0000 mg | ORAL_TABLET | Freq: Four times a day (QID) | ORAL | Status: DC | PRN
Start: 2022-06-14 — End: 2022-06-17
  Administered 2022-06-14: 4 mg via ORAL
  Filled 2022-06-14: qty 1

## 2022-06-14 MED ORDER — POLYETHYLENE GLYCOL 3350 17 G PO PACK
17.0000 g | PACK | Freq: Every day | ORAL | Status: DC
  Administered 2022-06-14 – 2022-06-15 (×2): 17 g via ORAL
  Filled 2022-06-14 (×2): qty 1

## 2022-06-14 MED ORDER — SODIUM CHLORIDE 1 G PO TABS: 1.0000 g | ORAL_TABLET | Freq: Three times a day (TID) | ORAL | Status: DC

## 2022-06-14 NOTE — Progress Notes (Signed)
Physical Therapy Treatment Patient Details Name: Maria [name] MRN: [id number] DOB: June 23, 1924 Today's Date: 06/14/2022   History of Present Illness Pt is a 88 y/o female who presented after mechanical fall at her ILF with prolonged time down on floor. Pt found to have fx of multiple pubic rami (L), L distal radius fx - both treated nonsurgically. Pt also found with chronic fx of T10, L1, L2. PMH: a fib, HTN, CAD, CKD, GERD, macular degeneration    PT Comments    Pt admitted with above diagnosis. Pt was able to stand and pivot to recliner with left PFRW with mod assist and cues with assist for weight shifting and for postural stability.  Pt tends to lean over and it makes if more difficult for her to move feet.  Needs +2 to progress ambulation.  Pt is making progress.  Discussed d/c with daughter who is anxious for mother to go to get Rehab.  Should progress well.  Pt currently with functional limitations due to balance and endurance deficits. Pt will benefit from skilled PT to increase their independence and safety with mobility to allow discharge to the venue listed below.      Recommendations for follow up therapy are one component of a multi-disciplinary discharge planning process, led by the attending physician.  Recommendations may be updated based on patient status, additional functional criteria and insurance authorization.  Follow Up Recommendations  Skilled nursing-short term rehab (<3 hours/day) Can patient physically be transported by private vehicle: No   Assistance Recommended at Discharge Frequent or constant Supervision/Assistance  Patient can return home with the following Two people to help with walking and/or transfers;Two people to help with bathing/dressing/bathroom;Assistance with cooking/housework;Direct supervision/assist for medications management;Direct supervision/assist for financial management;Assistance with feeding;Assist for transportation;Help with stairs or  ramp for entrance   Equipment Recommendations  None recommended by PT    Recommendations for Other Services       Precautions / Restrictions Precautions Precautions: Fall;Other (comment) Precaution Comments: back precautions and sling for comfort Required Braces or Orthoses: Sling Restrictions Weight Bearing Restrictions: Yes LUE Weight Bearing: Weight bear through elbow only RLE Weight Bearing: Weight bearing as tolerated LLE Weight Bearing: Weight bearing as tolerated     Mobility  Bed Mobility Overal bed mobility: Needs Assistance Bed Mobility: Rolling, Sidelying to Sit Rolling: Mod assist Sidelying to sit: Max assist, HOB elevated       General bed mobility comments: Pt needed a lot of assist to come to eOB as she couldnt move her LEs on her own and needed assist to elevate trunk.  Had a hard time gettting feet on floor once up with pt maintaining knee extension bilateraly initially upon sitting.    Transfers Overall transfer level: Needs assistance Equipment used: Left platform walker (gait belt) Transfers: Sit to/from Stand Sit to Stand: Mod assist, From elevated surface           General transfer comment: Pt needed mod assist to power up and mod assist to pivot to chair with assist to stand tall as she tends to want to look at her feet.  Also assist and cues to weight shift bilaterally and to turn to chair.   Pt incontinent and cleaned pt up and changed gown once she was in chair. Had to assist left UE up to PF on RW and down and put sling back on. Propped left elbow on pillow to help to decr swelling.    Ambulation/Gait  Stairs             Wheelchair Mobility    Modified Rankin (Stroke Patients Only)       Balance Overall balance assessment: Needs assistance Sitting-balance support: Feet supported, Single extremity supported Sitting balance-Leahy Scale: Fair Sitting balance - Comments: Took incr time to obtain balance  but was able to sit with min gaurd assist   Standing balance support: Bilateral upper extremity supported, During functional activity Standing balance-Leahy Scale: Poor Standing balance comment: relies on at least left elbow support and right UE support and external support with left PFRW                            Cognition Arousal/Alertness: Awake/alert Behavior During Therapy: WFL for tasks assessed/performed Overall Cognitive Status: Within Functional Limits for tasks assessed                                          Exercises General Exercises - Lower Extremity Ankle Circles/Pumps: AROM, Both, 5 reps, Supine Long Arc Quad: AROM, Both, Seated, 15 reps Heel Slides: AAROM, Both, Supine, 10 reps Hip ABduction/ADduction: AROM, Both, 10 reps, Supine    General Comments General comments (skin integrity, edema, etc.): Son and daughter present.  Asked alot of questions about pts Rehab and recovery.      Pertinent Vitals/Pain Pain Assessment Pain Assessment: Faces Faces Pain Scale: Hurts even more Pain Location: L hip, back Pain Descriptors / Indicators: Aching, Discomfort, Grimacing, Guarding Pain Intervention(s): Limited activity within patient's tolerance, Monitored during session, Repositioned    Home Living                          Prior Function            PT Goals (current goals can now be found in the care plan section) Acute Rehab PT Goals Patient Stated Goal: to get better Progress towards PT goals: Progressing toward goals    Frequency    Min 3X/week      PT Plan Current plan remains appropriate    Co-evaluation              AM-PAC PT "6 Clicks" Mobility   Outcome Measure  Help needed turning from your back to your side while in a flat bed without using bedrails?: Total Help needed moving from lying on your back to sitting on the side of a flat bed without using bedrails?: Total Help needed moving to and  from a bed to a chair (including a wheelchair)?: Total Help needed standing up from a chair using your arms (e.g., wheelchair or bedside chair)?: Total Help needed to walk in hospital room?: Total Help needed climbing 3-5 steps with a railing? : Total 6 Click Score: 6    End of Session Equipment Utilized During Treatment: Gait belt Activity Tolerance: Patient limited by fatigue;Patient limited by pain Patient left: in chair;with call bell/phone within reach;with chair alarm set;with family/visitor present Nurse Communication: Mobility status;Need for lift equipment (Maxi move pad in chair) PT Visit Diagnosis: Unsteadiness on feet (R26.81);Muscle weakness (generalized) (M62.81);Pain Pain - Right/Left: Left Pain - part of body: Leg;Arm     Time: 5409-8119 PT Time Calculation (min) (ACUTE ONLY): 43 min  Charges:  $Gait Training: 8-22 mins $Therapeutic Exercise: 8-22 mins $Therapeutic Activity: 8-22 mins  [facility] M,PT Acute Rehab Services [phone]    Alvira Philips 06/14/2022, 2:33 PM

## 2022-06-14 NOTE — Assessment & Plan Note (Signed)
No associated bradycardia. Patient is on diltiazem for atrial fibrillation. Discussed with cardiology; recommendations for no medication changes at this time.

## 2022-06-14 NOTE — Assessment & Plan Note (Signed)
Continue miralax at discharge

## 2022-06-14 NOTE — Progress Notes (Signed)
PROGRESS NOTE    MARIEANNE [name]  NAT:[id number] DOB: [birth year] DOA: 06/10/2022 PCP: [name], MD   Brief Narrative: Maria [name] is a 88 y.o. female with a history of PAF, stroke, hypertension, diastolic heart failure. Patient presented after a fall, suffering a left wrist fracture and pelvic fracture. Orthopedic surgery consulted and recommended nonoperative management. Hospitalization complicated by pain and hyponatremia.   Assessment and Plan: * Fracture of multiple pubic rami, left, closed, initial encounter [facility]) Orthopedic surgery consulted. Recommendations for weight bearing as tolerated. PT/OT consulted with recommendations for SNF. -Continue Norco PO for pain; morphine IV for breakthrough -Mobility as tolerated  Fracture of distal end of left radius Currently with wrist splint. Nonoperative management. Left hand swelling is improved from yesterday. No evidence of hand fracture on previous x-ray. Per orthopedic surgery, non-weightbearing of left wrist.  Pulmonary hypertension, moderate to severe (HCC) -Continue home Lasix  Paroxysmal atrial fibrillation (HCC) Hemoglobin normal and stable. -Continue home Cardizem, amiodarone and Eliquis  Constipation -Miralax  AV block, 1st degree No associated bradycardia. Patient is on diltiazem for atrial fibrillation. Discussed with cardiology; recommendations for no medication changes at this time.  Hypoxia Uncertain etiology. Chest x-ray with some vascular congestion. No pulmonary edema. -Wean as able  Hypothyroidism -Continue Synthroid 25 mcg daily  Hyponatremia Per patient, this is a chronic issue. Baseline sodium appears to be around 130. Lasix held. Sodium continues to drop. Possibly SIADH in setting of significant pain. -Urine sodium/osmolality is still pending; discussed with nursing to obtain this morning -Pending urine studies, will give Lasix vs salt tablets vs IV fluids.  Depression -Continue  Zoloft  Essential hypertension, benign -Continue diltiazem   DVT prophylaxis: Eliquis Code Status:   Code Status: DNR Family Communication: Son at bedside Disposition Plan: Discharge to SNF likely in 1- days pending bed availability and improvement of hyponatremia   Consultants:  Orthopedic surgery  Procedures:  None  Antimicrobials: None    Subjective: Continued pain. She has not moved much over the weekend.  Objective: BP 115/65 (BP Location: Right Arm)   Pulse 70   Temp 97.6 F (36.4 C) (Oral)   Resp 16   Ht 5' (1.524 m)   Wt 41.7 kg   SpO2 100%   BMI 17.97 kg/m   Examination:  General exam: Appears calm and comfortable Respiratory system: Clear to auscultation. Respiratory effort normal. Cardiovascular system: S1 & S2 heard, RRR. Gastrointestinal system: Abdomen is nondistended, soft and nontender. No organomegaly or masses felt. Normal bowel sounds heard. Central nervous system: Alert and oriented. No focal neurological deficits. Psychiatry: Judgement and insight appear normal. Mood & affect appropriate.    Data Reviewed: I have personally reviewed following labs and imaging studies  CBC Lab Results  Component Value Date   WBC 8.8 06/12/2022   RBC 3.82 (L) 06/12/2022   HGB 11.9 (L) 06/12/2022   HCT 34.8 (L) 06/12/2022   MCV 91.1 06/12/2022   MCH 31.2 06/12/2022   PLT 250 06/12/2022   MCHC 34.2 06/12/2022   RDW 15.2 06/12/2022   LYMPHSABS 0.3 (L) 06/10/2022   MONOABS 0.4 06/10/2022   EOSABS 0.0 06/10/2022   BASOSABS 0.0 42/70/6237     Last metabolic panel Lab Results  Component Value Date   NA 125 (L) 06/14/2022   K 3.9 06/14/2022   CL 95 (L) 06/14/2022   CO2 22 06/14/2022   BUN 32 (H) 06/14/2022   CREATININE 0.82 06/14/2022   GLUCOSE 112 (H) 06/14/2022   GFRNONAA >60  06/14/2022   GFRAA >60 11/30/2018   CALCIUM 8.4 (L) 06/14/2022   PROT 6.8 06/10/2022   ALBUMIN 3.4 (L) 06/10/2022   LABGLOB 3.0 10/02/2021   AGRATIO 1.3 10/02/2021    BILITOT 0.8 06/10/2022   ALKPHOS 74 06/10/2022   AST 48 (H) 06/10/2022   ALT 30 06/10/2022   ANIONGAP 8 06/14/2022    GFR: Estimated Creatinine Clearance: 26.4 mL/min (by C-G formula based on SCr of 0.82 mg/dL).  No results found for this or any previous visit (from the past 240 hour(s)).    Radiology Studies: No results found.    LOS: 2 days    Cordelia Poche, MD [facility] 06/14/2022, 12:50 PM   If 7PM-7AM, please contact night-coverage [email or web address]

## 2022-06-15 DIAGNOSIS — I480 Paroxysmal atrial fibrillation: Secondary | ICD-10-CM | POA: Diagnosis not present

## 2022-06-15 DIAGNOSIS — R338 Other retention of urine: Secondary | ICD-10-CM

## 2022-06-15 DIAGNOSIS — E039 Hypothyroidism, unspecified: Secondary | ICD-10-CM | POA: Diagnosis not present

## 2022-06-15 DIAGNOSIS — I10 Essential (primary) hypertension: Secondary | ICD-10-CM | POA: Diagnosis not present

## 2022-06-15 DIAGNOSIS — S32592A Other specified fracture of left pubis, initial encounter for closed fracture: Secondary | ICD-10-CM | POA: Diagnosis not present

## 2022-06-15 LAB — BASIC METABOLIC PANEL
Anion gap: 10 (ref 5–15)
BUN: 33 mg/dL — ABNORMAL HIGH (ref 8–23)
CO2: 20 mmol/L — ABNORMAL LOW (ref 22–32)
Calcium: 8.5 mg/dL — ABNORMAL LOW (ref 8.9–10.3)
Chloride: 97 mmol/L — ABNORMAL LOW (ref 98–111)
Creatinine, Ser: 0.74 mg/dL (ref 0.44–1.00)
GFR, Estimated: >60 mL/min (ref >60)
Glucose, Bld: 102 mg/dL — ABNORMAL HIGH (ref 70–99)
Potassium: 4.2 mmol/L (ref 3.5–5.1)
Sodium: 127 mmol/L — ABNORMAL LOW (ref 135–145)

## 2022-06-15 MED ORDER — POLYETHYLENE GLYCOL 3350 17 G PO PACK
17.0000 g | PACK | Freq: Two times a day (BID) | ORAL | Status: DC
  Administered 2022-06-15 – 2022-06-17 (×3): 17 g via ORAL
  Filled 2022-06-15 (×4): qty 1

## 2022-06-15 MED ORDER — BISACODYL 10 MG RE SUPP
10.0000 mg | Freq: Once | RECTAL | Status: AC
  Administered 2022-06-15: 10 mg via RECTAL
  Filled 2022-06-15: qty 1

## 2022-06-15 MED ORDER — SODIUM CHLORIDE 0.9 % IV SOLN
INTRAVENOUS | Status: AC
Start: 2022-06-15 — End: 2022-06-15

## 2022-06-15 MED ORDER — SORBITOL 70 % SOLN
960.0000 mL | TOPICAL_OIL | Freq: Once | ORAL | Status: DC
  Filled 2022-06-15: qty 473

## 2022-06-15 NOTE — Care Management Important Message (Signed)
Important Message  Patient Details  Name: Maria [name] MRN: [id number] Date of Birth: 09-24-1924   Medicare Important Message Given:  Yes     Hannah Beat 06/15/2022, 11:38 AM

## 2022-06-15 NOTE — Progress Notes (Signed)
PROGRESS NOTE    Maria [name]  XYV:[id number] DOB: 1924-10-24 DOA: 06/10/2022 PCP: [name], MD   Brief Narrative: Maria [name] is a 88 y.o. female with a history of PAF, stroke, hypertension, diastolic heart failure. Patient presented after a fall, suffering a left wrist fracture and pelvic fracture. Orthopedic surgery consulted and recommended nonoperative management. Hospitalization complicated by pain, hyponatremia and acute urinary retention.   Assessment and Plan: * Fracture of multiple pubic rami, left, closed, initial encounter [facility]) Orthopedic surgery consulted. Recommendations for weight bearing as tolerated. PT/OT consulted with recommendations for SNF. -Continue Norco PO for pain; morphine IV for breakthrough -Mobility as tolerated  Fracture of distal end of left radius Currently with wrist splint. Nonoperative management. Left hand swelling is improved from yesterday. No evidence of hand fracture on previous x-ray. Per orthopedic surgery, non-weightbearing of left wrist.  Pulmonary hypertension, moderate to severe (HCC) -Continue home Lasix  Paroxysmal atrial fibrillation (HCC) Hemoglobin normal and stable. -Continue home Cardizem, amiodarone and Eliquis  Acute urinary retention Possibly related to recent surgery/narcotics. Patient required in/out catheterization on 8/21. -Bladder scan q4 hours -In/out cath as needed for retention  Constipation -Increase to Miralax BID -Dulcolax suppository  AV block, 1st degree No associated bradycardia. Patient is on diltiazem for atrial fibrillation. Discussed with cardiology; recommendations for no medication changes at this time.  Hypoxia Uncertain etiology. Chest x-ray with some vascular congestion. No pulmonary edema. -Wean as able  Hypothyroidism -Continue Synthroid 25 mcg daily  Hyponatremia Per patient, this is a chronic issue. Baseline sodium appears to be around 130. Lasix held. Sodium dropped  to as low as 125. Possibly SIADH in setting of significant pain. Urine studies with low sodium. NS IV fluids started with some improvement -Continue NS IV fluids @ 50 mL/hr -BMP in AM -Hold Lasix  Depression -Continue Zoloft  Essential hypertension, benign -Continue diltiazem   DVT prophylaxis: Eliquis Code Status:   Code Status: DNR Family Communication: Son at bedside Disposition Plan: Discharge to SNF likely in 1- days pending bed availability and improvement of hyponatremia   Consultants:  Orthopedic surgery  Procedures:  None  Antimicrobials: None    Subjective: Patient without significant concerns this morning. No bowel movement last night. Some nausea but no emesis. No abdominal pain.  Objective: BP 130/63 (BP Location: Right Arm)   Pulse 68   Temp 98.9 F (37.2 C) (Oral)   Resp 17   Ht 5' (1.524 m)   Wt 41.7 kg   SpO2 96%   BMI 17.97 kg/m   Examination:  General exam: Appears calm and comfortable Respiratory system: Clear to auscultation. Respiratory effort normal. Cardiovascular system: S1 & S2 heard, RRR. No murmurs, rubs, gallops or clicks. Gastrointestinal system: Abdomen is non-distended, soft and nontender. Normal bowel sounds heard. Central nervous system: Alert and oriented. No focal neurological deficits. Musculoskeletal: Edema of dorsum of left hand. No calf tenderness. Left arm in sling. Skin: No cyanosis. No rashes Psychiatry: Judgement and insight appear normal. Mood & affect appropriate.    Data Reviewed: I have personally reviewed following labs and imaging studies  CBC Lab Results  Component Value Date   WBC 8.8 06/12/2022   RBC 3.82 (L) 06/12/2022   HGB 11.9 (L) 06/12/2022   HCT 34.8 (L) 06/12/2022   MCV 91.1 06/12/2022   MCH 31.2 06/12/2022   PLT 250 06/12/2022   MCHC 34.2 06/12/2022   RDW 15.2 06/12/2022   LYMPHSABS 0.3 (L) 06/10/2022   MONOABS 0.4 06/10/2022  EOSABS 0.0 06/10/2022   BASOSABS 0.0 06/10/2022      Last metabolic panel Lab Results  Component Value Date   NA 127 (L) 06/15/2022   K 4.2 06/15/2022   CL 97 (L) 06/15/2022   CO2 20 (L) 06/15/2022   BUN 33 (H) 06/15/2022   CREATININE 0.74 06/15/2022   GLUCOSE 102 (H) 06/15/2022   GFRNONAA >60 06/15/2022   GFRAA >60 11/30/2018   CALCIUM 8.5 (L) 06/15/2022   PROT 6.8 06/10/2022   ALBUMIN 3.4 (L) 06/10/2022   LABGLOB 3.0 10/02/2021   AGRATIO 1.3 10/02/2021   BILITOT 0.8 06/10/2022   ALKPHOS 74 06/10/2022   AST 48 (H) 06/10/2022   ALT 30 06/10/2022   ANIONGAP 10 06/15/2022    GFR: Estimated Creatinine Clearance: 27.1 mL/min (by C-G formula based on SCr of 0.74 mg/dL).  No results found for this or any previous visit (from the past 240 hour(s)).    Radiology Studies: No results found.    LOS: 3 days    Maria Poche, MD [facility] 06/15/2022, 9:57 AM   If 7PM-7AM, please contact night-coverage [email or web address]

## 2022-06-15 NOTE — Progress Notes (Signed)
Physical Therapy Treatment Patient Details Name: Maria [name] MRN: [id number] DOB: Dec 22, 1923 Today's Date: 06/15/2022   History of Present Illness Pt is a 88 y/o female who presented after mechanical fall at her ILF with prolonged time down on floor. Pt found to have fx of multiple pubic rami (L), L distal radius fx - both treated nonsurgically. Pt also found with chronic fx of T10, L1, L2. PMH: a fib, HTN, CAD, CKD, GERD, macular degeneration    PT Comments    Pt admitted with above diagnosis. Pt was able to ambulate a few steps today with mod assist of 2 and max cues for sequencing. Pt fatigues quickly but is progressing.  Pt very happy once in chair as she felt happy that she was able to take steps.  Pt currently with functional limitations due to balance and endurance deficits. Pt will benefit from skilled PT to increase their independence and safety with mobility to allow discharge to the venue listed below.      Recommendations for follow up therapy are one component of a multi-disciplinary discharge planning process, led by the attending physician.  Recommendations may be updated based on patient status, additional functional criteria and insurance authorization.  Follow Up Recommendations  Skilled nursing-short term rehab (<3 hours/day) Can patient physically be transported by private vehicle: No   Assistance Recommended at Discharge Frequent or constant Supervision/Assistance  Patient can return home with the following Two people to help with walking and/or transfers;Two people to help with bathing/dressing/bathroom;Assistance with cooking/housework;Direct supervision/assist for medications management;Direct supervision/assist for financial management;Assistance with feeding;Assist for transportation;Help with stairs or ramp for entrance   Equipment Recommendations  None recommended by PT    Recommendations for Other Services       Precautions / Restrictions  Precautions Precautions: Fall;Other (comment) Precaution Comments: back precautions and sling for comfort Required Braces or Orthoses: Sling Restrictions Weight Bearing Restrictions: Yes LUE Weight Bearing: Weight bear through elbow only RLE Weight Bearing: Weight bearing as tolerated LLE Weight Bearing: Weight bearing as tolerated     Mobility  Bed Mobility Overal bed mobility: Needs Assistance Bed Mobility: Rolling, Sidelying to Sit Rolling: Mod assist, +2 for physical assistance Sidelying to sit: HOB elevated, Mod assist, +2 for physical assistance       General bed mobility comments: Pt needed a lot of assist to come to eOB as she couldnt move her LEs on her own and needed assist to elevate trunk.  Had a hard time gettting feet on floor once up with pt maintaining knee extension bilateraly initially upon sitting.    Transfers Overall transfer level: Needs assistance Equipment used: Left platform walker (gait belt) Transfers: Sit to/from Stand Sit to Stand: Mod assist, From elevated surface, +2 physical assistance           General transfer comment: Pt needed mod assist to power up.Pt needs cues to stand tall as she tends to want to look at her feet.  Had to assist left UE up to PF on RW and down and put sling back on.    Ambulation/Gait Ambulation/Gait assistance: Mod assist, +2 physical assistance, +2 safety/equipment Gait Distance (Feet): 6 Feet Assistive device: Left platform walker Gait Pattern/deviations: Step-to pattern, Decreased step length - right, Decreased step length - left, Decreased stance time - left, Decreased weight shift to left, Antalgic, Leaning posteriorly, Trunk flexed   Gait velocity interpretation: <1.31 ft/sec, indicative of household ambulator   General Gait Details: Pt needs cues to stand tall as  well as cues and assist to sequence steps and RW. Pt initially needed assist to move right LE but eventually was able to take the last 2 steps with  less assist.  Does better when she stands taller however she does flex as she fatigues.   Stairs             Wheelchair Mobility    Modified Rankin (Stroke Patients Only)       Balance Overall balance assessment: Needs assistance Sitting-balance support: Feet supported, Single extremity supported Sitting balance-Leahy Scale: Fair Sitting balance - Comments: Took incr time to obtain balance but was able to sit with min guard assist   Standing balance support: Bilateral upper extremity supported, During functional activity Standing balance-Leahy Scale: Poor Standing balance comment: relies on at least left elbow support and right UE support and external support with left PFRW                            Cognition Arousal/Alertness: Awake/alert Behavior During Therapy: WFL for tasks assessed/performed Overall Cognitive Status: Within Functional Limits for tasks assessed                                          Exercises General Exercises - Lower Extremity Ankle Circles/Pumps: AROM, Both, 5 reps, Supine Long Arc Quad: AROM, Both, Seated, 15 reps Heel Slides: AAROM, Both, Supine, 10 reps Hip ABduction/ADduction: AROM, Both, 10 reps, Supine    General Comments        Pertinent Vitals/Pain Pain Assessment Pain Assessment: Faces Faces Pain Scale: Hurts even more Pain Location: L hip, back Pain Descriptors / Indicators: Aching, Discomfort, Grimacing, Guarding Pain Intervention(s): Limited activity within patient's tolerance, Monitored during session, Repositioned    Home Living                          Prior Function            PT Goals (current goals can now be found in the care plan section) Acute Rehab PT Goals Patient Stated Goal: to get better Progress towards PT goals: Progressing toward goals    Frequency    Min 3X/week      PT Plan Current plan remains appropriate    Co-evaluation               AM-PAC PT "6 Clicks" Mobility   Outcome Measure  Help needed turning from your back to your side while in a flat bed without using bedrails?: Total Help needed moving from lying on your back to sitting on the side of a flat bed without using bedrails?: Total Help needed moving to and from a bed to a chair (including a wheelchair)?: Total Help needed standing up from a chair using your arms (e.g., wheelchair or bedside chair)?: Total Help needed to walk in hospital room?: Total Help needed climbing 3-5 steps with a railing? : Total 6 Click Score: 6    End of Session Equipment Utilized During Treatment: Gait belt Activity Tolerance: Patient limited by fatigue;Patient limited by pain Patient left: in chair;with call bell/phone within reach;with chair alarm set;with family/visitor present Nurse Communication: Mobility status;Need for lift equipment (Maxi move pad in chair) PT Visit Diagnosis: Unsteadiness on feet (R26.81);Muscle weakness (generalized) (M62.81);Pain Pain - Right/Left: Left Pain - part of body: Leg;Arm  Time: 1884-1660 PT Time Calculation (min) (ACUTE ONLY): 35 min  Charges:  $Gait Training: 8-22 mins $Therapeutic Exercise: 8-22 mins                     [facility] M,PT Acute Rehab Services [place] 06/15/2022, 11:48 AM

## 2022-06-15 NOTE — Assessment & Plan Note (Signed)
Possibly related to recent surgery/narcotics. Patient required in/out catheterization on 8/23. -Bladder scan q4 hours -In/out cath as needed for retention -may need indwelling foley placement if recurrent

## 2022-06-15 NOTE — Progress Notes (Signed)
Occupational Therapy Treatment Patient Details Name: Maria [name] MRN: [id number] DOB: 30-Nov-1923 Today's Date: 06/15/2022   History of present illness Pt is a 88 y/o female who presented after mechanical fall at her ILF with prolonged time down on floor. Pt found to have fx of multiple pubic rami (L), L distal radius fx - both treated nonsurgically. Pt also found with chronic fx of T10, L1, L2. PMH: a fib, HTN, CAD, CKD, GERD, macular degeneration   OT comments  Pt seen for OT session this afternoon after sitting up in chair since previous PT session. Pt noted with increased confusion, extremity stiffness and bottom discomfort. Pt required Max A to stand with L platform walker though unable to sustain. With nursing assist, pt able to transfer back to bed with Max A x 2 with OT assisting pt into partial sidelying for pressure relief. Continue to rec SNF rehab at [place].   Recommendations for follow up therapy are one component of a multi-disciplinary discharge planning process, led by the attending physician.  Recommendations may be updated based on patient status, additional functional criteria and insurance authorization.    Follow Up Recommendations  Skilled nursing-short term rehab (<3 hours/day) ([facility])    Assistance Recommended at Discharge Frequent or constant Supervision/Assistance  Patient can return home with the following  Two people to help with walking and/or transfers;Two people to help with bathing/dressing/bathroom   Equipment Recommendations  Other (comment) (L platform RW)    Recommendations for Other Services      Precautions / Restrictions Precautions Precautions: Fall;Other (comment) Precaution Comments: back precautions and sling for comfort Required Braces or Orthoses: Sling Restrictions Weight Bearing Restrictions: Yes LUE Weight Bearing: Weight bear through elbow only RLE Weight Bearing: Weight bearing as tolerated LLE Weight Bearing: Weight  bearing as tolerated       Mobility Bed Mobility Overal bed mobility: Needs Assistance Bed Mobility: Sit to Supine       Sit to supine: Mod assist, +2 for physical assistance, HOB elevated   General bed mobility comments: assist with BLE back to bed with pain noted. assisted into partial sidelying with pillow to offload bottom    Transfers Overall transfer level: Needs assistance Equipment used: Left platform walker, 2 person hand held assist Transfers: Sit to/from Stand, Bed to chair/wheelchair/BSC Sit to Stand: Max assist Stand pivot transfers: Max assist, +2 physical assistance, +2 safety/equipment         General transfer comment: Trialed 2 sit to stands with L platform walker with pt unable to fully stand up. RN in to assist OT in getting pt back to bed for pressure relief with Max A x 2 for manual transfer over     Balance Overall balance assessment: Needs assistance Sitting-balance support: Feet supported, Single extremity supported Sitting balance-Leahy Scale: Fair     Standing balance support: Bilateral upper extremity supported, During functional activity Standing balance-Leahy Scale: Poor                             ADL either performed or assessed with clinical judgement   ADL Overall ADL's : Needs assistance/impaired                                       General ADL Comments: Plan modified from initial AM plans with pt increased stiffness and confusion noted with  difficulty standing using L platform walker. pt required increased physical assist, also citing pain in bottom (bandage intact, reported redness).    Extremity/Trunk Assessment Upper Extremity Assessment Upper Extremity Assessment: LUE deficits/detail LUE Deficits / Details: bandaged wrist to proximal elbow, digits swollen, able to extend but increased pain with minimal flexion attempts LUE Coordination: decreased fine motor   Lower Extremity Assessment Lower  Extremity Assessment: Defer to PT evaluation        Vision   Vision Assessment?: No apparent visual deficits   Perception     Praxis      Cognition Arousal/Alertness: Awake/alert Behavior During Therapy: WFL for tasks assessed/performed Overall Cognitive Status: Impaired/Different from baseline Area of Impairment: Awareness, Problem solving, Following commands                       Following Commands: Follows one step commands with increased time   Awareness: Emergent Problem Solving: Slow processing, Decreased initiation, Difficulty sequencing, Requires tactile cues, Requires verbal cues General Comments: pt's daughter present, acknowledging that pt more confused at this time requiring increased time to respond to directions, at times choosing the wrong words in conversation and requiring increased cues to complete tasks        Exercises      Shoulder Instructions       General Comments Daughter present    Pertinent Vitals/ Pain       Pain Assessment Pain Assessment: Faces Faces Pain Scale: Hurts even more Pain Location: bottom Pain Descriptors / Indicators: Guarding, Grimacing, Moaning, Sore Pain Intervention(s): Monitored during session, Repositioned  Home Living                                          Prior Functioning/Environment              Frequency  Min 2X/week        Progress Toward Goals  OT Goals(current goals can now be found in the care plan section)  Progress towards OT goals: Progressing toward goals  Acute Rehab OT Goals Patient Stated Goal: pain control OT Goal Formulation: With patient/family Time For Goal Achievement: 06/25/22 Potential to Achieve Goals: Good ADL Goals Pt Will Perform Grooming: with set-up;sitting Pt Will Perform Upper Body Dressing: with min assist;sitting Pt Will Perform Lower Body Dressing: with min assist;sit to/from stand;sitting/lateral leans Pt Will Transfer to Toilet:  with min assist;stand pivot transfer;bedside commode  Plan Discharge plan remains appropriate    Co-evaluation                 AM-PAC OT "6 Clicks" Daily Activity     Outcome Measure   Help from another person eating meals?: A Little Help from another person taking care of personal grooming?: A Little Help from another person toileting, which includes using toliet, bedpan, or urinal?: Total Help from another person bathing (including washing, rinsing, drying)?: A Lot Help from another person to put on and taking off regular upper body clothing?: A Lot Help from another person to put on and taking off regular lower body clothing?: Total 6 Click Score: 12    End of Session Equipment Utilized During Treatment: Gait belt  OT Visit Diagnosis: Unsteadiness on feet (R26.81);Other abnormalities of gait and mobility (R26.89);Muscle weakness (generalized) (M62.81)   Activity Tolerance Patient limited by pain   Patient Left in bed;with call bell/phone within reach;with bed  alarm set;with family/visitor present   Nurse Communication Mobility status        Time: [phone] OT Time Calculation (min): 30 min  Charges: OT General Charges $OT Visit: 1 Visit OT Treatments $Therapeutic Activity: 23-37 mins  Malachy Chamber, OTR/L Acute Rehab Services Office: [phone]   Layla Maw 06/15/2022, 1:55 PM

## 2022-06-16 ENCOUNTER — Inpatient Hospital Stay (HOSPITAL_COMMUNITY): Payer: Medicare Other

## 2022-06-16 DIAGNOSIS — R9389 Abnormal findings on diagnostic imaging of other specified body structures: Secondary | ICD-10-CM

## 2022-06-16 DIAGNOSIS — S32592A Other specified fracture of left pubis, initial encounter for closed fracture: Secondary | ICD-10-CM | POA: Diagnosis not present

## 2022-06-16 LAB — BASIC METABOLIC PANEL
Anion gap: 6 (ref 5–15)
BUN: 20 mg/dL (ref 8–23)
CO2: 23 mmol/L (ref 22–32)
Calcium: 8.4 mg/dL — ABNORMAL LOW (ref 8.9–10.3)
Chloride: 99 mmol/L (ref 98–111)
Creatinine, Ser: 0.63 mg/dL (ref 0.44–1.00)
GFR, Estimated: >60 mL/min (ref >60)
Glucose, Bld: 100 mg/dL — ABNORMAL HIGH (ref 70–99)
Potassium: 4.3 mmol/L (ref 3.5–5.1)
Sodium: 128 mmol/L — ABNORMAL LOW (ref 135–145)

## 2022-06-16 LAB — BRAIN NATRIURETIC PEPTIDE: B Natriuretic Peptide: 249 pg/mL — ABNORMAL HIGH (ref 0.0–100.0)

## 2022-06-16 MED ORDER — FUROSEMIDE 10 MG/ML IJ SOLN
40.0000 mg | Freq: Once | INTRAMUSCULAR | Status: AC
  Administered 2022-06-16: 40 mg via INTRAVENOUS
  Filled 2022-06-16: qty 4

## 2022-06-16 MED ORDER — BISACODYL 10 MG RE SUPP: 10.0000 mg | Freq: Once | RECTAL | Status: DC

## 2022-06-16 NOTE — Progress Notes (Signed)
Physical Therapy Treatment Patient Details Name: Maria [name] MRN: [id number] DOB: June 25, 1924 Today's Date: 06/16/2022   History of Present Illness Pt is a 88 y/o female who presented after mechanical fall at her ILF with prolonged time down on floor. Pt found to have fx of multiple pubic rami (L), L distal radius fx - both treated nonsurgically. Pt also found with chronic fx of T10, L1, L2. PMH: a fib, HTN, CAD, CKD, GERD, macular degeneration    PT Comments    Pt admitted with above diagnosis. Pt was able to ambulate with incr distance today and with +1 mod assist.  Needs chair follow for safety and lot of cues however pt is progressing.   Will continue to follow acutely.  Pt currently with functional limitations due to balance and endurance deficits. Pt will benefit from skilled PT to increase their independence and safety with mobility to allow discharge to the venue listed below.      Recommendations for follow up therapy are one component of a multi-disciplinary discharge planning process, led by the attending physician.  Recommendations may be updated based on patient status, additional functional criteria and insurance authorization.  Follow Up Recommendations  Skilled nursing-short term rehab (<3 hours/day) Can patient physically be transported by private vehicle: No   Assistance Recommended at Discharge Frequent or constant Supervision/Assistance  Patient can return home with the following Two people to help with walking and/or transfers;Two people to help with bathing/dressing/bathroom;Assistance with cooking/housework;Direct supervision/assist for medications management;Direct supervision/assist for financial management;Assistance with feeding;Assist for transportation;Help with stairs or ramp for entrance   Equipment Recommendations  None recommended by PT    Recommendations for Other Services       Precautions / Restrictions Precautions Precautions: Fall;Other  (comment) Precaution Comments: back precautions and sling for comfort Required Braces or Orthoses: Sling Restrictions Weight Bearing Restrictions: Yes LUE Weight Bearing: Weight bear through elbow only RLE Weight Bearing: Weight bearing as tolerated LLE Weight Bearing: Weight bearing as tolerated     Mobility  Bed Mobility Overal bed mobility: Needs Assistance   Rolling: Mod assist Sidelying to sit: HOB elevated, Mod assist       General bed mobility comments: assist with BLE back to bed with pain noted. Assist with trunk as well due to pain.    Transfers Overall transfer level: Needs assistance Equipment used: Left platform walker, 2 person hand held assist Transfers: Sit to/from Stand, Bed to chair/wheelchair/BSC Sit to Stand: Mod assist, From elevated surface           General transfer comment: Pt able to stand to left PFRW with mod assist and incr time for pt to get balance once up with initial posterior lean. However wtih cues and assist to stand tall, pt able to get balance.    Ambulation/Gait Ambulation/Gait assistance: Mod assist, Min assist Gait Distance (Feet): 10 Feet Assistive device: Left platform walker Gait Pattern/deviations: Step-to pattern, Decreased step length - right, Decreased step length - left, Decreased stance time - left, Decreased weight shift to left, Antalgic, Leaning posteriorly, Trunk flexed   Gait velocity interpretation: <1.31 ft/sec, indicative of household ambulator   General Gait Details: Pt needs cues to stand tall as well as cues and assist to sequence steps and RW. Pt still needs assist to weight shift and cues for each step but is able to take steps with no assist and only cues.  Does better when she stands taller however she does flex as she fatigues.  Also the  last few steps took incr time due to fatigue.  Brought chair to pt.   Stairs             Wheelchair Mobility    Modified Rankin (Stroke Patients Only)        Balance Overall balance assessment: Needs assistance Sitting-balance support: Feet supported, Single extremity supported Sitting balance-Leahy Scale: Fair Sitting balance - Comments: Took incr time to obtain balance but was able to sit with min guard assist   Standing balance support: Bilateral upper extremity supported, During functional activity Standing balance-Leahy Scale: Poor Standing balance comment: relies on at least left elbow support and right UE support and external support with left PFRW but can stand statically with min assist and cues.                            Cognition Arousal/Alertness: Awake/alert Behavior During Therapy: WFL for tasks assessed/performed Overall Cognitive Status: Impaired/Different from baseline Area of Impairment: Awareness, Problem solving, Following commands                       Following Commands: Follows one step commands with increased time   Awareness: Emergent Problem Solving: Slow processing, Decreased initiation, Difficulty sequencing, Requires tactile cues, Requires verbal cues          Exercises General Exercises - Lower Extremity Ankle Circles/Pumps: AROM, Both, 5 reps, Supine Long Arc Quad: AROM, Both, Seated, 15 reps Heel Slides: AAROM, Both, Supine, 10 reps Hip ABduction/ADduction: AROM, Both, 10 reps, Supine    General Comments        Pertinent Vitals/Pain Pain Assessment Pain Assessment: Faces Faces Pain Scale: Hurts even more Pain Location: bottom Pain Descriptors / Indicators: Guarding, Grimacing, Moaning, Sore Pain Intervention(s): Limited activity within patient's tolerance, Monitored during session, Repositioned    Home Living                          Prior Function            PT Goals (current goals can now be found in the care plan section) Acute Rehab PT Goals Patient Stated Goal: to get better Progress towards PT goals: Progressing toward goals    Frequency     Min 3X/week      PT Plan Current plan remains appropriate    Co-evaluation              AM-PAC PT "6 Clicks" Mobility   Outcome Measure  Help needed turning from your back to your side while in a flat bed without using bedrails?: A Lot Help needed moving from lying on your back to sitting on the side of a flat bed without using bedrails?: A Lot Help needed moving to and from a bed to a chair (including a wheelchair)?: A Lot Help needed standing up from a chair using your arms (e.g., wheelchair or bedside chair)?: A Lot Help needed to walk in hospital room?: A Lot Help needed climbing 3-5 steps with a railing? : Total 6 Click Score: 11    End of Session Equipment Utilized During Treatment: Gait belt Activity Tolerance: Patient limited by fatigue;Patient limited by pain Patient left: in chair;with call bell/phone within reach;with chair alarm set Nurse Communication: Mobility status;Need for lift equipment (Maxi move pad in chair) PT Visit Diagnosis: Unsteadiness on feet (R26.81);Muscle weakness (generalized) (M62.81);Pain Pain - Right/Left: Left Pain - part of body: Leg;Arm  Time: 0925-1007 PT Time Calculation (min) (ACUTE ONLY): 42 min  Charges:  $Gait Training: 23-37 mins $Therapeutic Exercise: 8-22 mins                     [facility] M,PT Acute Rehab Services [phone]    Alvira Philips 06/16/2022, 12:02 PM

## 2022-06-16 NOTE — Progress Notes (Signed)
Pt without urine output all shift. In and out cath done done per orders. Output 700 ml

## 2022-06-16 NOTE — Progress Notes (Signed)
Mobility Specialist Progress Note   06/16/22 1342  Mobility  Activity Transferred from chair to bed  Level of Assistance Moderate assist, patient does 50-74%  Assistive Device Other (Comment) Chief Operating Officer)  LUE Weight Bearing Weight bear through elbow only  RLE Weight Bearing WBAT  LLE Weight Bearing WBAT  Distance Ambulated (ft) 4 ft  Activity Response Tolerated well  $Mobility charge 1 Mobility   Pt requesting to get from chair to bed d/t discomfort. Required modA for initial stand but minG for ambulation. Min cues were also provided for sequencing, pt slightly anxious and requiring reassurance in directions. Pt left in bed with all needs met and call bell in reach.  Holland Falling Mobility Specialist MS [facility] #:  [phone] Acute Rehab Office:  [phone]'

## 2022-06-16 NOTE — Progress Notes (Signed)
HOSPITAL MEDICINE OVERNIGHT EVENT NOTE    Notified by nursing that patient's bladder scan is revealing 634 cc with patient unable to void.  This is occurred several times during this hospitalization.  We will order repeat in and out catheterization.  Continue to monitor with serial as needed bladder scans.  Maria Emerald  MD [facility]

## 2022-06-16 NOTE — Progress Notes (Signed)
Pt refused Sorbital enema. Says she got suppository during the day and Miralax at [facility] and wanted to give them a chance to work before she tries enema during the day. Will continue to monitor.

## 2022-06-16 NOTE — Progress Notes (Signed)
PROGRESS NOTE    [name]  VWU:[id number] DOB: 1923/11/10 DOA: 06/10/2022 PCP: Merlene Laughter, MD  Chief Complaint  Patient presents with   Fall    Brief Narrative:  Maria [name] is Maria [name] 88 y.o. female with Kilah Drahos history of PAF, stroke, hypertension, diastolic heart failure. Patient presented after Derrious Bologna fall, suffering Adrienne Trombetta left wrist fracture and pelvic fracture. Orthopedic surgery consulted and recommended nonoperative management. Hospitalization complicated by pain, hyponatremia and acute urinary retention.    Assessment & Plan:   Principal Problem:   Fracture of multiple pubic rami, left, closed, initial encounter (HCC) Active Problems:   DNR (do not resuscitate)   Fracture of distal end of left radius   Closed fracture of multiple pubic rami, left, initial encounter (HCC)   Pulmonary hypertension, moderate to severe (HCC)   Hyponatremia   Paroxysmal atrial fibrillation (HCC)   Essential hypertension, benign   Depression   Acute urinary retention   Abnormal CXR   Hypothyroidism   Hypoxia   AV block, 1st degree   Protein-calorie malnutrition, severe (HCC)   Constipation   Assessment and Plan: * Fracture of multiple pubic rami, left, closed, initial encounter (HCC) Plain films pelvis with minimally displaced fracture of the superior pubic ramus on the L and possible nondisplaced fracture of the L inferior pubic ramus  Orthopedic surgery consulted. Recommendations for weight bearing as tolerated. PT/OT consulted with recommendations for SNF -Continue Tylenol and Tramadol per orthopedic surgery -Mobility as tolerated  Fracture of distal end of left radius Plain films with acute fx of distal L radius at the base of the radial styloid that demonstrates no significant displacement Currently with wrist splint. Nonoperative management. Left hand swelling is improved from yesterday. No evidence of hand fracture on previous x-ray. Per orthopedic surgery, non-weightbearing of  left wrist.  Hyponatremia Per patient, this is Quy Lotts chronic issue.  Baseline sodium appears to be around 130 Sodium dropped to as low as 125 128 today Possibly SIADH in setting of significant pain/nausea.  Urine studies with low sodium representing sodium avid state.  She's on lasix at home, ? Volume status -> plain films with small bibasilar pleural effusion and atelectasis greater on R, bnp elevated - maybe slightly overloaded Trial lasix IV and follow   Pulmonary hypertension, moderate to severe (HCC) -Continue home Lasix  Paroxysmal atrial fibrillation (HCC) Hemoglobin normal and stable. -Continue home Cardizem, amiodarone and Eliquis  Essential hypertension, benign -Continue diltiazem  Depression -Continue Zoloft  Abnormal CXR Plain films with small bibasilar effusions and atelectasis greater on R, can't exclude right basilar infiltrate/pneumonia Lower suspicion for infection, will follow   Acute urinary retention Possibly related to recent surgery/narcotics. Patient required in/out catheterization on 8/23. -Bladder scan q4 hours -In/out cath as needed for retention -may need indwelling foley placement if recurrent  Hypothyroidism -Continue Synthroid 25 mcg daily  Hypoxia Currently on RA  AV block, 1st degree No associated bradycardia. Patient is on diltiazem for atrial fibrillation. Discussed with cardiology; recommendations for no medication changes at this time.  Constipation -Increase to Miralax BID -Dulcolax suppository -trial enema  Protein-calorie malnutrition, severe (HCC) noted     DVT prophylaxis: eliquis Code Status: dnr Family Communication: daughter Disposition:   Status is: Inpatient Remains inpatient appropriate because: need for continued follow up of hyponatremia   Consultants:  ortho  Procedures:  none  Antimicrobials:  Anti-infectives (From admission, onward)    None       Subjective: No new complaints, noted some SOB  last night  Objective: Vitals:   06/15/22 1850 06/16/22 0444 06/16/22 1109 06/16/22 1309  BP: (!) 130/55 (!) 126/56 (!) 110/48 114/78  Pulse: 62 60 61   Resp: 16 12 18    Temp: 97.6 F (36.4 C)  98.6 F (37 C)   TempSrc: Oral  Oral   SpO2: 97% 90% 95%   Weight:      Height:        Intake/Output Summary (Last 24 hours) at 06/16/2022 1553 Last data filed at 06/16/2022 0700 Gross per 24 hour  Intake 485.83 ml  Output 700 ml  Net -214.17 ml   Filed Weights   06/10/22 1536  Weight: 41.7 kg    Examination:  General exam: Appears calm and comfortable  Respiratory system: unlabored Cardiovascular system: RRR Gastrointestinal system: Abdomen is nondistended, soft and nontender Central nervous system: Alert and oriented. No focal neurological deficits. Extremities: l arm in sling     Data Reviewed: I have personally reviewed following labs and imaging studies  CBC: Recent Labs  Lab 06/10/22 1541 06/10/22 1609 06/12/22 0121  WBC 14.0*  --  8.8  NEUTROABS 13.3*  --   --   HGB 12.0 12.6 11.9*  HCT 35.6* 37.0 34.8*  MCV 92.2  --  91.1  PLT 235  --  250    Basic Metabolic Panel: Recent Labs  Lab 06/12/22 0121 06/13/22 0246 06/14/22 0221 06/15/22 0219 06/16/22 0231  NA 128* 127* 125* 127* 128*  K 3.8 3.9 3.9 4.2 4.3  CL 99 97* 95* 97* 99  CO2 21* 21* 22 20* 23  GLUCOSE 109* 112* 112* 102* 100*  BUN 23 32* 32* 33* 20  CREATININE 0.88 1.07* 0.82 0.74 0.63  CALCIUM 8.5* 8.4* 8.4* 8.5* 8.4*    GFR: Estimated Creatinine Clearance: 27.1 mL/min (by C-G formula based on SCr of 0.63 mg/dL).  Liver Function Tests: Recent Labs  Lab 06/10/22 1541  AST 48*  ALT 30  ALKPHOS 74  BILITOT 0.8  PROT 6.8  ALBUMIN 3.4*    CBG: No results for input(s): "GLUCAP" in the last 168 hours.   No results found for this or any previous visit (from the past 240 hour(s)).       Radiology Studies: DG CHEST PORT 1 VIEW  Result Date: 06/16/2022 CLINICAL DATA:  This of  breath today, history of fall and LEFT wrist fracture EXAM: PORTABLE CHEST 1 VIEW COMPARISON:  Portable exam 0853 hours compared to 06/12/2022 FINDINGS: Enlargement of cardiac silhouette post CABG and LEFT atrial appendage clipping. Atherosclerotic calcification aorta. Emphysematous and bronchitic changes consistent with COPD. Bibasilar atelectasis and small pleural effusions greater on RIGHT. Cannot exclude coexisting consolidation in RIGHT lower lobe. Upper lungs clear. No pneumothorax or acute osseous findings. IMPRESSION: Enlargement of cardiac silhouette. Small bibasilar pleural effusions and atelectasis greater on RIGHT. Cannot exclude coexistent RIGHT basilar infiltrate/pneumonia. Aortic Atherosclerosis (ICD10-I70.0). Electronically Signed   By: Ulyses Southward M.D.   On: 06/16/2022 09:04        Scheduled Meds:  amiodarone  200 mg Oral QHS   apixaban  2.5 mg Oral BID   bisacodyl  10 mg Rectal Once   cholecalciferol  400 Units Oral Daily   diltiazem  120 mg Oral Daily   feeding supplement  1 Container Oral TID BM   levothyroxine  25 mcg Oral Q0600   melatonin  5 mg Oral QHS   polyethylene glycol  17 g Oral BID   polyvinyl alcohol  1 drop Both Eyes  QHS   sertraline  50 mg Oral q morning   sorbitol, milk of mag, mineral oil, glycerin (SMOG) enema  960 mL Rectal Once   Continuous Infusions:   LOS: 4 days    Time spent: over 30 min    Lacretia Nicks, MD [facility]   To contact the attending provider between 7A-7P or the covering provider during after hours 7P-7A, please log into the web site [email or web address] and access using universal [place] password for that web site. If you do not have the password, please call the hospital operator.  06/16/2022, 3:53 PM

## 2022-06-16 NOTE — Progress Notes (Signed)
Bladder Scan 634 mls informed MD that this will be 4 th I&O cath.

## 2022-06-16 NOTE — Assessment & Plan Note (Signed)
noted 

## 2022-06-16 NOTE — Assessment & Plan Note (Signed)
Plain films with small bibasilar effusions and atelectasis greater on R, can't exclude right basilar infiltrate/pneumonia Lower suspicion for infection, will follow   Follow outpatient CXR in [place] few weeks

## 2022-06-16 NOTE — Progress Notes (Signed)
MD on call notified Via secure chat that 2 RN's have attempted to do I&O cath and unsuccessful telephone [facility] for assistance.

## 2022-06-17 DIAGNOSIS — Z79899 Other long term (current) drug therapy: Secondary | ICD-10-CM | POA: Diagnosis not present

## 2022-06-17 DIAGNOSIS — Z87440 Personal history of urinary (tract) infections: Secondary | ICD-10-CM | POA: Diagnosis not present

## 2022-06-17 DIAGNOSIS — E871 Hypo-osmolality and hyponatremia: Secondary | ICD-10-CM | POA: Diagnosis not present

## 2022-06-17 DIAGNOSIS — R0602 Shortness of breath: Secondary | ICD-10-CM | POA: Diagnosis not present

## 2022-06-17 DIAGNOSIS — M25552 Pain in left hip: Secondary | ICD-10-CM | POA: Diagnosis not present

## 2022-06-17 DIAGNOSIS — R531 Weakness: Secondary | ICD-10-CM | POA: Diagnosis not present

## 2022-06-17 DIAGNOSIS — I130 Hypertensive heart and chronic kidney disease with heart failure and stage 1 through stage 4 chronic kidney disease, or unspecified chronic kidney disease: Secondary | ICD-10-CM | POA: Diagnosis not present

## 2022-06-17 DIAGNOSIS — R1314 Dysphagia, pharyngoesophageal phase: Secondary | ICD-10-CM | POA: Diagnosis not present

## 2022-06-17 DIAGNOSIS — R0989 Other specified symptoms and signs involving the circulatory and respiratory systems: Secondary | ICD-10-CM | POA: Diagnosis not present

## 2022-06-17 DIAGNOSIS — S52502A Unspecified fracture of the lower end of left radius, initial encounter for closed fracture: Secondary | ICD-10-CM | POA: Diagnosis not present

## 2022-06-17 DIAGNOSIS — J159 Unspecified bacterial pneumonia: Secondary | ICD-10-CM | POA: Diagnosis not present

## 2022-06-17 DIAGNOSIS — G4733 Obstructive sleep apnea (adult) (pediatric): Secondary | ICD-10-CM | POA: Diagnosis not present

## 2022-06-17 DIAGNOSIS — R3989 Other symptoms and signs involving the genitourinary system: Secondary | ICD-10-CM | POA: Diagnosis not present

## 2022-06-17 DIAGNOSIS — Z951 Presence of aortocoronary bypass graft: Secondary | ICD-10-CM | POA: Diagnosis not present

## 2022-06-17 DIAGNOSIS — Z743 Need for continuous supervision: Secondary | ICD-10-CM | POA: Diagnosis not present

## 2022-06-17 DIAGNOSIS — K219 Gastro-esophageal reflux disease without esophagitis: Secondary | ICD-10-CM | POA: Diagnosis not present

## 2022-06-17 DIAGNOSIS — Z681 Body mass index (BMI) 19 or less, adult: Secondary | ICD-10-CM | POA: Diagnosis not present

## 2022-06-17 DIAGNOSIS — S52502D Unspecified fracture of the lower end of left radius, subsequent encounter for closed fracture with routine healing: Secondary | ICD-10-CM | POA: Diagnosis not present

## 2022-06-17 DIAGNOSIS — I517 Cardiomegaly: Secondary | ICD-10-CM | POA: Diagnosis not present

## 2022-06-17 DIAGNOSIS — I6529 Occlusion and stenosis of unspecified carotid artery: Secondary | ICD-10-CM | POA: Diagnosis not present

## 2022-06-17 DIAGNOSIS — R222 Localized swelling, mass and lump, trunk: Secondary | ICD-10-CM | POA: Diagnosis not present

## 2022-06-17 DIAGNOSIS — S32592A Other specified fracture of left pubis, initial encounter for closed fracture: Secondary | ICD-10-CM | POA: Diagnosis not present

## 2022-06-17 DIAGNOSIS — R7989 Other specified abnormal findings of blood chemistry: Secondary | ICD-10-CM

## 2022-06-17 DIAGNOSIS — C182 Malignant neoplasm of ascending colon: Secondary | ICD-10-CM | POA: Diagnosis not present

## 2022-06-17 DIAGNOSIS — I25709 Atherosclerosis of coronary artery bypass graft(s), unspecified, with unspecified angina pectoris: Secondary | ICD-10-CM | POA: Diagnosis not present

## 2022-06-17 DIAGNOSIS — R339 Retention of urine, unspecified: Secondary | ICD-10-CM | POA: Diagnosis not present

## 2022-06-17 DIAGNOSIS — R197 Diarrhea, unspecified: Secondary | ICD-10-CM | POA: Diagnosis not present

## 2022-06-17 DIAGNOSIS — J9601 Acute respiratory failure with hypoxia: Secondary | ICD-10-CM | POA: Diagnosis not present

## 2022-06-17 DIAGNOSIS — R059 Cough, unspecified: Secondary | ICD-10-CM | POA: Diagnosis not present

## 2022-06-17 DIAGNOSIS — I482 Chronic atrial fibrillation, unspecified: Secondary | ICD-10-CM | POA: Diagnosis not present

## 2022-06-17 DIAGNOSIS — D509 Iron deficiency anemia, unspecified: Secondary | ICD-10-CM | POA: Diagnosis not present

## 2022-06-17 DIAGNOSIS — C189 Malignant neoplasm of colon, unspecified: Secondary | ICD-10-CM | POA: Diagnosis not present

## 2022-06-17 DIAGNOSIS — S329XXD Fracture of unspecified parts of lumbosacral spine and pelvis, subsequent encounter for fracture with routine healing: Secondary | ICD-10-CM | POA: Diagnosis not present

## 2022-06-17 DIAGNOSIS — Z7901 Long term (current) use of anticoagulants: Secondary | ICD-10-CM | POA: Diagnosis not present

## 2022-06-17 DIAGNOSIS — I272 Pulmonary hypertension, unspecified: Secondary | ICD-10-CM | POA: Diagnosis not present

## 2022-06-17 DIAGNOSIS — M6281 Muscle weakness (generalized): Secondary | ICD-10-CM | POA: Diagnosis not present

## 2022-06-17 DIAGNOSIS — G2581 Restless legs syndrome: Secondary | ICD-10-CM | POA: Diagnosis not present

## 2022-06-17 DIAGNOSIS — E559 Vitamin D deficiency, unspecified: Secondary | ICD-10-CM | POA: Diagnosis not present

## 2022-06-17 DIAGNOSIS — E039 Hypothyroidism, unspecified: Secondary | ICD-10-CM | POA: Diagnosis not present

## 2022-06-17 DIAGNOSIS — D649 Anemia, unspecified: Secondary | ICD-10-CM | POA: Diagnosis not present

## 2022-06-17 DIAGNOSIS — N183 Chronic kidney disease, stage 3 unspecified: Secondary | ICD-10-CM | POA: Diagnosis not present

## 2022-06-17 DIAGNOSIS — N1831 Chronic kidney disease, stage 3a: Secondary | ICD-10-CM | POA: Diagnosis not present

## 2022-06-17 DIAGNOSIS — I5031 Acute diastolic (congestive) heart failure: Secondary | ICD-10-CM | POA: Diagnosis not present

## 2022-06-17 DIAGNOSIS — R110 Nausea: Secondary | ICD-10-CM | POA: Diagnosis not present

## 2022-06-17 DIAGNOSIS — E876 Hypokalemia: Secondary | ICD-10-CM | POA: Diagnosis not present

## 2022-06-17 DIAGNOSIS — J90 Pleural effusion, not elsewhere classified: Secondary | ICD-10-CM | POA: Diagnosis not present

## 2022-06-17 DIAGNOSIS — F32A Depression, unspecified: Secondary | ICD-10-CM | POA: Diagnosis not present

## 2022-06-17 DIAGNOSIS — W19XXXD Unspecified fall, subsequent encounter: Secondary | ICD-10-CM | POA: Diagnosis not present

## 2022-06-17 DIAGNOSIS — R41841 Cognitive communication deficit: Secondary | ICD-10-CM | POA: Diagnosis not present

## 2022-06-17 DIAGNOSIS — C787 Secondary malignant neoplasm of liver and intrahepatic bile duct: Secondary | ICD-10-CM | POA: Diagnosis not present

## 2022-06-17 DIAGNOSIS — R918 Other nonspecific abnormal finding of lung field: Secondary | ICD-10-CM | POA: Diagnosis not present

## 2022-06-17 DIAGNOSIS — I5032 Chronic diastolic (congestive) heart failure: Secondary | ICD-10-CM | POA: Diagnosis not present

## 2022-06-17 DIAGNOSIS — W19XXXA Unspecified fall, initial encounter: Secondary | ICD-10-CM | POA: Diagnosis not present

## 2022-06-17 DIAGNOSIS — Z789 Other specified health status: Secondary | ICD-10-CM | POA: Diagnosis not present

## 2022-06-17 DIAGNOSIS — R590 Localized enlarged lymph nodes: Secondary | ICD-10-CM | POA: Diagnosis not present

## 2022-06-17 DIAGNOSIS — K6389 Other specified diseases of intestine: Secondary | ICD-10-CM | POA: Diagnosis not present

## 2022-06-17 DIAGNOSIS — I480 Paroxysmal atrial fibrillation: Secondary | ICD-10-CM | POA: Diagnosis not present

## 2022-06-17 DIAGNOSIS — I2510 Atherosclerotic heart disease of native coronary artery without angina pectoris: Secondary | ICD-10-CM | POA: Diagnosis not present

## 2022-06-17 DIAGNOSIS — Z1152 Encounter for screening for COVID-19: Secondary | ICD-10-CM | POA: Diagnosis not present

## 2022-06-17 DIAGNOSIS — K900 Celiac disease: Secondary | ICD-10-CM | POA: Diagnosis not present

## 2022-06-17 DIAGNOSIS — I110 Hypertensive heart disease with heart failure: Secondary | ICD-10-CM | POA: Diagnosis not present

## 2022-06-17 DIAGNOSIS — Z7189 Other specified counseling: Secondary | ICD-10-CM | POA: Diagnosis not present

## 2022-06-17 DIAGNOSIS — N312 Flaccid neuropathic bladder, not elsewhere classified: Secondary | ICD-10-CM | POA: Diagnosis not present

## 2022-06-17 DIAGNOSIS — E43 Unspecified severe protein-calorie malnutrition: Secondary | ICD-10-CM | POA: Diagnosis not present

## 2022-06-17 DIAGNOSIS — J984 Other disorders of lung: Secondary | ICD-10-CM | POA: Diagnosis not present

## 2022-06-17 DIAGNOSIS — Z66 Do not resuscitate: Secondary | ICD-10-CM | POA: Diagnosis not present

## 2022-06-17 DIAGNOSIS — Z8249 Family history of ischemic heart disease and other diseases of the circulatory system: Secondary | ICD-10-CM | POA: Diagnosis not present

## 2022-06-17 DIAGNOSIS — J9811 Atelectasis: Secondary | ICD-10-CM | POA: Diagnosis not present

## 2022-06-17 DIAGNOSIS — J189 Pneumonia, unspecified organism: Secondary | ICD-10-CM | POA: Diagnosis not present

## 2022-06-17 DIAGNOSIS — R338 Other retention of urine: Secondary | ICD-10-CM | POA: Diagnosis not present

## 2022-06-17 DIAGNOSIS — R64 Cachexia: Secondary | ICD-10-CM | POA: Diagnosis not present

## 2022-06-17 DIAGNOSIS — S32592D Other specified fracture of left pubis, subsequent encounter for fracture with routine healing: Secondary | ICD-10-CM | POA: Diagnosis not present

## 2022-06-17 DIAGNOSIS — Z09 Encounter for follow-up examination after completed treatment for conditions other than malignant neoplasm: Secondary | ICD-10-CM | POA: Diagnosis not present

## 2022-06-17 DIAGNOSIS — L259 Unspecified contact dermatitis, unspecified cause: Secondary | ICD-10-CM | POA: Diagnosis not present

## 2022-06-17 DIAGNOSIS — M109 Gout, unspecified: Secondary | ICD-10-CM | POA: Diagnosis not present

## 2022-06-17 DIAGNOSIS — R54 Age-related physical debility: Secondary | ICD-10-CM | POA: Diagnosis not present

## 2022-06-17 DIAGNOSIS — I5043 Acute on chronic combined systolic (congestive) and diastolic (congestive) heart failure: Secondary | ICD-10-CM | POA: Diagnosis not present

## 2022-06-17 DIAGNOSIS — I5033 Acute on chronic diastolic (congestive) heart failure: Secondary | ICD-10-CM | POA: Diagnosis not present

## 2022-06-17 DIAGNOSIS — C3431 Malignant neoplasm of lower lobe, right bronchus or lung: Secondary | ICD-10-CM | POA: Diagnosis not present

## 2022-06-17 DIAGNOSIS — I5030 Unspecified diastolic (congestive) heart failure: Secondary | ICD-10-CM | POA: Diagnosis not present

## 2022-06-17 DIAGNOSIS — R262 Difficulty in walking, not elsewhere classified: Secondary | ICD-10-CM | POA: Diagnosis not present

## 2022-06-17 LAB — COMPREHENSIVE METABOLIC PANEL
ALT: 106 U/L — ABNORMAL HIGH (ref 0–44)
AST: 135 U/L — ABNORMAL HIGH (ref 15–41)
Albumin: 2.7 g/dL — ABNORMAL LOW (ref 3.5–5.0)
Alkaline Phosphatase: 99 U/L (ref 38–126)
Anion gap: 9 (ref 5–15)
BUN: 17 mg/dL (ref 8–23)
CO2: 23 mmol/L (ref 22–32)
Calcium: 8.4 mg/dL — ABNORMAL LOW (ref 8.9–10.3)
Chloride: 97 mmol/L — ABNORMAL LOW (ref 98–111)
Creatinine, Ser: 0.59 mg/dL (ref 0.44–1.00)
GFR, Estimated: >60 mL/min (ref >60)
Glucose, Bld: 105 mg/dL — ABNORMAL HIGH (ref 70–99)
Potassium: 3.7 mmol/L (ref 3.5–5.1)
Sodium: 129 mmol/L — ABNORMAL LOW (ref 135–145)
Total Bilirubin: 0.9 mg/dL (ref 0.3–1.2)
Total Protein: 6 g/dL — ABNORMAL LOW (ref 6.5–8.1)

## 2022-06-17 LAB — CBC WITH DIFFERENTIAL/PLATELET
Abs Immature Granulocytes: 0.09 10*3/uL — ABNORMAL HIGH (ref 0.00–0.07)
Basophils Absolute: 0.1 10*3/uL (ref 0.0–0.1)
Basophils Relative: 1 %
Eosinophils Absolute: 0.1 10*3/uL (ref 0.0–0.5)
Eosinophils Relative: 1 %
HCT: 32.6 % — ABNORMAL LOW (ref 36.0–46.0)
Hemoglobin: 11.5 g/dL — ABNORMAL LOW (ref 12.0–15.0)
Immature Granulocytes: 1 %
Lymphocytes Relative: 8 %
Lymphs Abs: 0.8 10*3/uL (ref 0.7–4.0)
MCH: 30.9 pg (ref 26.0–34.0)
MCHC: 35.3 g/dL (ref 30.0–36.0)
MCV: 87.6 fL (ref 80.0–100.0)
Monocytes Absolute: 0.8 10*3/uL (ref 0.1–1.0)
Monocytes Relative: 7 %
Neutro Abs: 8.6 10*3/uL — ABNORMAL HIGH (ref 1.7–7.7)
Neutrophils Relative %: 82 %
Platelets: 338 10*3/uL (ref 150–400)
RBC: 3.72 MIL/uL — ABNORMAL LOW (ref 3.87–5.11)
RDW: 14.6 % (ref 11.5–15.5)
WBC: 10.4 10*3/uL (ref 4.0–10.5)
nRBC: 0 % (ref 0.0–0.2)

## 2022-06-17 LAB — MAGNESIUM: Magnesium: 1.7 mg/dL (ref 1.7–2.4)

## 2022-06-17 LAB — PHOSPHORUS: Phosphorus: 3.1 mg/dL (ref 2.5–4.6)

## 2022-06-17 LAB — GLUCOSE, CAPILLARY: Glucose-Capillary: 96 mg/dL (ref 70–99)

## 2022-06-17 MED ORDER — ACETAMINOPHEN 500 MG PO TABS: 500.0000 mg | ORAL_TABLET | Freq: Four times a day (QID) | ORAL | 0 refills | Status: AC | PRN

## 2022-06-17 MED ORDER — POLYETHYLENE GLYCOL 3350 17 G PO PACK: 17.0000 g | PACK | Freq: Every day | ORAL | 0 refills | Status: AC

## 2022-06-17 MED ORDER — CHLORHEXIDINE GLUCONATE CLOTH 2 % EX PADS
6.0000 | MEDICATED_PAD | Freq: Every day | CUTANEOUS | Status: DC
  Administered 2022-06-17: 6 via TOPICAL

## 2022-06-17 MED ORDER — FUROSEMIDE 40 MG PO TABS: 80.0000 mg | ORAL_TABLET | Freq: Once | ORAL | Status: DC

## 2022-06-17 NOTE — Progress Notes (Signed)
Orthopedic Tech Progress Note Patient Details:  Maria [name] 30-Aug-1924 [id number]  Ortho Devices Ortho Device/Splint Interventions: Application, Ordered   Post Interventions Patient Tolerated: Well  Amiliah Campisi A Glendell Fouse 06/17/2022, 3:20 PM

## 2022-06-17 NOTE — TOC Transition Note (Signed)
Transition of Care [facility]) - CM/SW Discharge Note   Patient Details  Name: Maria [name] MRN: [id number] Date of Birth: 1923-12-01  [facility] of Care [facility] CM/SW Contact:  Joanne Chars, LCSW Phone Number: 06/17/2022, 12:31 PM   Clinical Narrative:   Pt discharging to [facility]. RN call [phone] for report.     Final next level of care: Skilled Nursing Facility Barriers to Discharge: Continued Medical Work up   Patient Goals and CMS Choice     Choice offered to / list presented to : Adult Children (daughter [name])  Discharge Placement              Patient chooses bed at:  ([facility]) Patient to be transferred to facility by: [facility] Name of family member notified: daughter [name] in room Patient and family notified of of transfer: 06/17/22  Discharge Plan and Services In-house Referral: Clinical Social Work   Post Acute Care Choice: [place]                               Social Determinants of Health (SDOH) Interventions     Readmission Risk Interventions     No data to display

## 2022-06-17 NOTE — Discharge Summary (Signed)
Physician Discharge Summary  Maria [name]:[id number] DOB: Feb 25, 1924 DOA: 06/10/2022  PCP: Merlene Laughter, MD  Admit date: 06/10/2022 Discharge date: 06/17/2022  Time spent: 40 minutes  Recommendations for Outpatient Follow-up:  Follow outpatient CBC/CMP in 1-2 days (attention to hyponatremia and elevated LFT's) Follow with orthopedics in 2 weeks Appt with urology, August 31st at 8:15, needs to arrive by 8 am for trial of void  Follow hyponatremia, currently treating with lasix, follow, workup further or adjust management as needed (see below) Elevated LFT's, follow trend, if not improving needs additional w/u, possibly needs to discontinue meds including amiodarone? Repeat CXR in 1-2 weeks Follow constipation  Discharge Diagnoses:  Principal Problem:   Fracture of multiple pubic rami, left, closed, initial encounter (HCC) Active Problems:   DNR (do not resuscitate)   Fracture of distal end of left radius   Closed fracture of multiple pubic rami, left, initial encounter (HCC)   Hyponatremia   Paroxysmal atrial fibrillation (HCC)   Pulmonary hypertension, moderate to severe (HCC)   Essential hypertension, benign   Depression   Acute urinary retention   Abnormal CXR   Hypothyroidism   Hypoxia   AV block, 1st degree   Protein-calorie malnutrition, severe (HCC)   Constipation   Elevated LFTs   Discharge Condition: stable  Diet recommendation: heart healthy  Filed Weights   06/10/22 1536  Weight: 41.7 kg    History of present illness:  Maria [name] is Maria [name] 88 y.o. [name] with Maria [name] history of PAF, stroke, hypertension, diastolic heart failure. Patient presented after Maria [name] fall, suffering Maria [name] left wrist fracture and pelvic fracture. Orthopedic surgery consulted and recommended nonoperative management. Hospitalization complicated by pain, hyponatremia and acute urinary retention.  She'll need orthopedics follow up after discharge.  Required foley catheter placement prior to  discharge, will need urology follow up.  Hyponatremia improving, will need follow up after discharge.  LFT's elevated as well, needs continued follow up.  See below for additional details  Hospital Course:  Assessment and Plan: * Fracture of multiple pubic rami, left, closed, initial encounter (HCC) Plain films pelvis with minimally displaced fracture of the superior pubic ramus on the L and possible nondisplaced fracture of the L inferior pubic ramus  Orthopedic surgery consulted. Recommendations for weight bearing as tolerated. PT/OT consulted with recommendations for SNF -Continue Tylenol as needed (hold tramadol with hyponatremia, she hasn't used anything for pain in Maria [name] few days, follow) -Mobility as tolerated  Fracture of distal end of left radius Plain films with acute fx of distal L radius at the base of the radial styloid that demonstrates no significant displacement Currently with wrist splint. Nonoperative management. Left hand swelling is improved from yesterday. No evidence of hand fracture on previous x-ray. Per orthopedic surgery, non-weightbearing of left wrist (ok to weight bear through forearm with platform walker).  Sling is optional.  Hyponatremia Per patient, this is Maria [name] chronic issue.  Baseline sodium appears to be around 130 Sodium dropped to as low as 125 Improved to 129 with diuresis yesterday - continue diuresis at discharge Possibly SIADH in setting of significant pain/nausea.  Component of volume overload with elevated BNP, bibasilar effusions on CXR, low urine sodium? Continue lasix at discharge Follow sodium outpatient, workup additionally as needed  Pulmonary hypertension, moderate to severe (HCC) -Continue home Lasix  Paroxysmal atrial fibrillation (HCC) Hemoglobin normal and stable. -Continue home Cardizem, amiodarone and Eliquis  Essential hypertension, benign -Continue diltiazem  Depression -Continue Zoloft  Abnormal CXR Plain films with small  bibasilar effusions and atelectasis greater on R, can't exclude right basilar infiltrate/pneumonia Lower suspicion for infection, will follow   Follow outpatient CXR in Maria [name] few weeks  Acute urinary retention Possibly related to recent surgery/narcotics.  Foley placed 8/24 am, will arrange urology follow up at discharge  Hypothyroidism -Continue Synthroid 25 mcg daily  Hypoxia Currently on RA  AV block, 1st degree No associated bradycardia. Patient is on diltiazem for atrial fibrillation. Discussed with cardiology; recommendations for no medication changes at this time.  Constipation Continue miralax at discharge  Protein-calorie malnutrition, severe (HCC) noted  Elevated LFTs Follow outpatient, if not improving, would reevaluate meds including amiodarone     Procedures: none   Consultations: orthopedics  Discharge Exam: Vitals:   06/16/22 2224 06/17/22 0931  BP: (!) 126/53 136/65  Pulse:  75  Resp: 16 17  Temp: 98.4 F (36.9 C) 98.3 F (36.8 C)  SpO2: 97% 96%   Feels wiped out, tired No SOB Denies new numbness, denies saddle anesthesia Discussed with daughter, discussed d/c plan  General: No acute distress. Cardiovascular: RRR Lungs: unlabored Abdomen: Soft, nontender, nondistended  Neurological: Alert and oriented 3. Moves all extremities 4 with equal strength. Cranial nerves II through XII grossly intact. Extremities: No clubbing or cyanosis. No edema.   Discharge Instructions   Discharge Instructions     Call MD for:  difficulty breathing, headache or visual disturbances   Complete by: As directed    Call MD for:  extreme fatigue   Complete by: As directed    Call MD for:  hives   Complete by: As directed    Call MD for:  persistant dizziness or light-headedness   Complete by: As directed    Call MD for:  persistant nausea and [name]   Complete by: As directed    Call MD for:  redness, tenderness, or signs of infection (pain, swelling,  redness, odor or green/yellow discharge around incision site)   Complete by: As directed    Call MD for:  severe uncontrolled pain   Complete by: As directed    Call MD for:  temperature >100.4   Complete by: As directed    Diet - low sodium heart healthy   Complete by: As directed    Discharge instructions   Complete by: As directed    You were seen after Annelisa Ryback fall.  You had pelvic fractures and Janaysha Depaulo left distal radius fracture.  Orthopedics recommends weight bearing as tolerated to your lower extremities.  You should be non weight bearing to the left wrist (ok to weight bear through forearm with Mccartney Chuba platform walker).  You'll need to follow up with orthopedics as an outpatient.  Your hospitalization was complicated by low sodium levels, nausea, pain, and urinary retention.  We've placed Resha Filippone foley catheter and you'll need to follow up with urology for Ladana Chavero trial of void within 7-14 days.    Your sodium levels are on the lower side.  This could be related to nausea or pain.  They're improving today.  We'll recommend continuing your lasix as prescribed.  Your PCP should repeat labs within the next few days to ensure things are trending in the right direction.  Your liver enzymes are slightly elevated today.  I'll recommend continued monitoring of your liver function, if it gets worse, you may need an adjustment in your medications or additional workup.  You should have Folashade Gamboa repeat chest x ray in [place] few weeks.   Return for new, recurrent, or worsening  symptoms.  Please ask your PCP to request records from this hospitalization so they know what was done and what the next steps will be.   Increase activity slowly   Complete by: As directed       Allergies as of 06/17/2022       Reactions   Maria [name]   Severe n/v   Amoxicillin Nausea Only, Other (See Comments)   dizziness   Atorvastatin Other (See Comments)   Unknown reaction - reported by Deboraha Sprang   Bupropion Other (See  Comments)   nightmares   Codeine Nausea And [name]   Fosamax [alendronate Sodium] Other (See Comments)   Jaw pain   Gabapentin Other (See Comments)   dizziness   Gluten Meal Other (See Comments)   Celiac Disease   Hydrochlorothiazide Other (See Comments)   Hyponatremia, GI upset   Tetanus Toxoid, Adsorbed Swelling   Severe swelling at site of injection (pt tolerates 1/2 injection then 1/2 week later)        Medication List     TAKE these medications    acetaminophen 500 MG tablet Commonly known as: TYLENOL Take 1 tablet (500 mg total) by mouth every 6 (six) hours as needed (pain). What changed: how much to take   amiodarone 200 MG tablet Commonly known as: PACERONE TAKE 1 TABLET BY MOUTH DAILY What changed: when to take this   apixaban 2.5 MG Tabs tablet Commonly known as: ELIQUIS Take 1 tablet (2.5 mg total) by mouth 2 (two) times daily.   BOOST PO Take by mouth.   cholecalciferol 10 MCG (400 UNIT) Tabs tablet Commonly known as: VITAMIN D3 Take 1 tablet (400 Units total) by mouth daily.   diltiazem 120 MG 24 hr capsule Commonly known as: CARDIZEM CD Take 1 capsule (120 mg total) by mouth daily.   furosemide 40 MG tablet Commonly known as: LASIX Take 80 mg by mouth every other day, alternate with 40 mg by mouth every other day.   ICY HOT EX Apply 1 application topically daily as needed (pain).   Levothyroxine Sodium 25 MCG Caps Take 1 capsule by mouth daily before breakfast.   magnesium hydroxide 400 MG/5ML suspension Commonly known as: MILK OF MAGNESIA Take 15 mLs by mouth at bedtime as needed for mild constipation.   melatonin 5 MG Tabs Take 1-2 tablets (5-10 mg total) by mouth at bedtime.   polyethylene glycol 17 g packet Commonly known as: MiraLax Take 17 g by mouth daily.   sertraline 50 MG tablet Commonly known as: ZOLOFT Take 50 mg by mouth every morning.   Systane 0.4-0.3 % Soln Generic drug: Polyethyl Glycol-Propyl Glycol Place 1  drop into both eyes at bedtime.       Allergies  Allergen Reactions   Maria [name]    Severe n/v   Amoxicillin Nausea Only and Other (See Comments)    dizziness   Atorvastatin Other (See Comments)    Unknown reaction - reported by Deboraha Sprang   Bupropion Other (See Comments)    nightmares   Codeine Nausea And [name]   Fosamax [Alendronate Sodium] Other (See Comments)    Jaw pain   Gabapentin Other (See Comments)    dizziness   Gluten Meal Other (See Comments)    Celiac Disease    Hydrochlorothiazide Other (See Comments)    Hyponatremia, GI upset    Tetanus Toxoid, Adsorbed Swelling    Severe swelling at site of injection (pt tolerates 1/2 injection then 1/2 week  later)    Follow-up Information     Teryl Lucy, MD. Schedule an appointment as soon as possible for Samuel Mcpeek visit in 2 week(s).   Specialty: Orthopedic Surgery Contact information: [address] [place] [address] [phone]                  The results of significant diagnostics from this hospitalization (including imaging, microbiology, ancillary and laboratory) are listed below for reference.    Significant Diagnostic Studies: DG CHEST PORT 1 VIEW  Result Date: 06/16/2022 CLINICAL DATA:  This of breath today, history of fall and LEFT wrist fracture EXAM: PORTABLE CHEST 1 VIEW COMPARISON:  Portable exam 0853 hours compared to 06/12/2022 FINDINGS: Enlargement of cardiac silhouette post CABG and LEFT atrial appendage clipping. Atherosclerotic calcification aorta. Emphysematous and bronchitic changes consistent with COPD. Bibasilar atelectasis and small pleural effusions greater on RIGHT. Cannot exclude coexisting consolidation in RIGHT lower lobe. Upper lungs clear. No pneumothorax or acute osseous findings. IMPRESSION: Enlargement of cardiac silhouette. Small bibasilar pleural effusions and atelectasis greater on RIGHT. Cannot exclude coexistent RIGHT basilar  infiltrate/pneumonia. Aortic Atherosclerosis (ICD10-I70.0). Electronically Signed   By: Ulyses Southward M.D.   On: 06/16/2022 09:04   DG CHEST PORT 1 VIEW  Result Date: 06/12/2022 CLINICAL DATA:  Hypoxia EXAM: PORTABLE CHEST 1 VIEW COMPARISON:  06/10/2022 FINDINGS: Patient is rotated towards the left. Bilateral mild interstitial thickening. Trace bilateral pleural effusions. No focal consolidation. No pneumothorax. Stable cardiomegaly. Prior CABG. No acute osseous abnormality. IMPRESSION: 1. Cardiomegaly with mild pulmonary vascular congestion. Electronically Signed   By: Elige Ko M.D.   On: 06/12/2022 11:01   DG Lumbar Spine 2-3 Views  Result Date: 06/11/2022 CLINICAL DATA:  Fall, low back pain. EXAM: LUMBAR SPINE - 2-3 VIEW COMPARISON:  Plain film of the lumbar spine dated 01/05/2017. CT abdomen and pelvis dated 08/30/2018. FINDINGS: Chronic levoscoliosis of the lumbar spine, moderate to severe in degree and grossly stable in alignment compared to the earlier CT abdomen/pelvis of 08/30/2018. Osteopenia limits characterization of osseous detail, however, there is no fracture line or displaced fracture fragment identified. Chronic compression deformities of the L1 and L2 vertebral bodies, not significantly changed compared to the previous plain film of 01/05/2017. Visualized portions of the upper osseous pelvis appear intact and within normal limits in alignment. RIGHT hip arthroplasty hardware is partially imaged. LEFT hip fixation screws are partially imaged. IMPRESSION: 1. No acute findings. No acute fracture or dislocation seen. 2. Chronic compression deformities of the L1 and L2 vertebral bodies, not significantly changed compared to previous plain film of 01/05/2017. 3. Chronic levoscoliosis of the lumbar spine, moderate to severe in degree and grossly stable compared to the earlier CT abdomen/pelvis of 08/30/2018. Electronically Signed   By: Bary Richard M.D.   On: 06/11/2022 09:37   CT Head Wo  Contrast  Result Date: 06/10/2022 CLINICAL DATA:  injuries from Larenzo Caples fall. They report pt fell at 6am and has been lying in the floor all day. Pt reports injury to L wrist, L hip, and whole back. Pt is Matty Vanroekel/Ox4 on arrival. EMS has C-collar and SAM splint to L wrist. EXAM: CT HEAD WITHOUT CONTRAST CT CERVICAL SPINE WITHOUT CONTRAST TECHNIQUE: Multidetector CT imaging of the head and cervical spine was performed following the standard protocol without intravenous contrast. Multiplanar CT image reconstructions of the cervical spine were also generated. RADIATION DOSE REDUCTION: This exam was performed according to the departmental dose-optimization program which includes automated exposure control, adjustment of  the mA and/or kV according to patient size and/or use of iterative reconstruction technique. COMPARISON:  None Available. FINDINGS: CT HEAD FINDINGS BRAIN: BRAIN Cerebral ventricle sizes are concordant with the degree of cerebral volume loss. Patchy and confluent areas of decreased attenuation are noted throughout the deep and periventricular white matter of the cerebral hemispheres bilaterally, compatible with chronic microvascular ischemic disease. No evidence of large-territorial acute infarction. No parenchymal hemorrhage. No mass lesion. No extra-axial collection. No mass effect or midline shift. No hydrocephalus. Basilar cisterns are patent. Vascular: No hyperdense vessel. Skull: No acute fracture or focal lesion. Sinuses/Orbits: Paranasal sinuses and mastoid air cells are clear. Bilateral lens replacement. Otherwise the orbits are unremarkable. Other: None. CT CERVICAL SPINE FINDINGS Alignment: Normal. Skull base and vertebrae: Multilevel moderate degenerative changes of the spine. Associated multilevel moderate to severe degenerative changes spine. No acute fracture. No aggressive appearing focal osseous lesion or focal pathologic process. Soft tissues and spinal canal: No prevertebral fluid or swelling. No  visible canal hematoma. Upper chest: Biapical pleural/pulmonary scarring. Other: None. IMPRESSION: 1. No acute intracranial abnormality. 2. No acute displaced fracture or traumatic listhesis of the cervical spine. Electronically Signed   By: Tish Frederickson M.D.   On: 06/10/2022 17:36   CT Cervical Spine Wo Contrast  Result Date: 06/10/2022 CLINICAL DATA:  injuries from Jezlyn Westerfield fall. They report pt fell at 6am and has been lying in the floor all day. Pt reports injury to L wrist, L hip, and whole back. Pt is Floriene Jeschke/Ox4 on arrival. EMS has C-collar and SAM splint to L wrist. EXAM: CT HEAD WITHOUT CONTRAST CT CERVICAL SPINE WITHOUT CONTRAST TECHNIQUE: Multidetector CT imaging of the head and cervical spine was performed following the standard protocol without intravenous contrast. Multiplanar CT image reconstructions of the cervical spine were also generated. RADIATION DOSE REDUCTION: This exam was performed according to the departmental dose-optimization program which includes automated exposure control, adjustment of the mA and/or kV according to patient size and/or use of iterative reconstruction technique. COMPARISON:  None Available. FINDINGS: CT HEAD FINDINGS BRAIN: BRAIN Cerebral ventricle sizes are concordant with the degree of cerebral volume loss. Patchy and confluent areas of decreased attenuation are noted throughout the deep and periventricular white matter of the cerebral hemispheres bilaterally, compatible with chronic microvascular ischemic disease. No evidence of large-territorial acute infarction. No parenchymal hemorrhage. No mass lesion. No extra-axial collection. No mass effect or midline shift. No hydrocephalus. Basilar cisterns are patent. Vascular: No hyperdense vessel. Skull: No acute fracture or focal lesion. Sinuses/Orbits: Paranasal sinuses and mastoid air cells are clear. Bilateral lens replacement. Otherwise the orbits are unremarkable. Other: None. CT CERVICAL SPINE FINDINGS Alignment: Normal.  Skull base and vertebrae: Multilevel moderate degenerative changes of the spine. Associated multilevel moderate to severe degenerative changes spine. No acute fracture. No aggressive appearing focal osseous lesion or focal pathologic process. Soft tissues and spinal canal: No prevertebral fluid or swelling. No visible canal hematoma. Upper chest: Biapical pleural/pulmonary scarring. Other: None. IMPRESSION: 1. No acute intracranial abnormality. 2. No acute displaced fracture or traumatic listhesis of the cervical spine. Electronically Signed   By: Tish Frederickson M.D.   On: 06/10/2022 17:36   DG Hip Unilat With Pelvis 2-3 Views Left  Result Date: 06/10/2022 CLINICAL DATA:  Fall with left hip pain. EXAM: DG HIP (WITH OR WITHOUT PELVIS) 2-3V LEFT COMPARISON:  03/30/2021 FINDINGS: Stable appearance of previously placed cannulated pins in the proximal left femur across the femoral neck and into the femoral head. No evidence  of acute fracture or dislocation. No abnormal lucency. The visualized bony pelvis appears intact. IMPRESSION: Stable appearance of previously placed left femoral pins. No evidence of acute fracture. Electronically Signed   By: Irish Lack M.D.   On: 06/10/2022 17:17   DG Forearm Left  Result Date: 06/10/2022 CLINICAL DATA:  Fall with left wrist pain. EXAM: LEFT FOREARM - 2 VIEW COMPARISON:  Left wrist films today. FINDINGS: See left wrist radiographs for description of Jonnathan Birman distal radial fracture which is also visible on the forearm radiographs and demonstrates minimal displacement. No additional radial or ulnar fractures identified with normal alignment present at the level of the elbow. IMPRESSION: Minimally displaced distal radial fracture again noted which is described on the left wrist radiograph report. No proximal radial or ulnar injury identified. Electronically Signed   By: Irish Lack M.D.   On: 06/10/2022 17:14   DG Wrist Complete Left  Result Date: 06/10/2022 CLINICAL  DATA:  Fall with left wrist pain. EXAM: LEFT WRIST - COMPLETE 3+ VIEW COMPARISON:  None Available. FINDINGS: Acute fracture of the distal radius present through the region of the radial styloid that demonstrates no significant displacement. Bones are diffusely osteopenic. Carpal bones demonstrate normal alignment. Mild degenerative disease of the radiocarpal joint and carpal bones. IMPRESSION: Acute fracture of the distal left radius at the base of the radial styloid that demonstrates no significant displacement. Electronically Signed   By: Irish Lack M.D.   On: 06/10/2022 17:13   DG Pelvis Portable  Result Date: 06/10/2022 CLINICAL DATA:  Post fall with LEFT hip pain. EXAM: PORTABLE PELVIS 1-2 VIEWS COMPARISON:  March 30, 2021 FINDINGS: Minimally displaced fracture of the superior pubic ramus on the LEFT and possible nondisplaced fracture of the inferior pubic ramus on the LEFT. Osteopenia without additional visible signs of fracture. Post RIGHT hip arthroplasty and prior ORIF of LEFT hip fracture. IMPRESSION: Minimally displaced fracture of the superior pubic ramus on the LEFT and possible nondisplaced fracture of the LEFT inferior pubic ramus. No additional fractures are seen though dedicated views of the hip are not provided in this patient reportedly with LEFT hip pain. Electronically Signed   By: Donzetta Kohut M.D.   On: 06/10/2022 15:57   DG Chest Port 1 View  Result Date: 06/10/2022 CLINICAL DATA:  Fall with left-sided arm and hip pain. EXAM: PORTABLE CHEST 1 VIEW COMPARISON:  03/28/2021 FINDINGS: Midline trachea. Mild cardiomegaly. Atherosclerosis in the transverse aorta. Median sternotomy for CABG and left atrial appendage occlusion. Trace left pleural fluid or thickening, decreased compared to the prior. No pneumothorax. No congestive failure. Mild left base scarring. Mild right hemidiaphragm elevation. Convex left thoracolumbar spine curvature. IMPRESSION: No acute cardiopulmonary disease.  Decreased trace left pleural fluid or thickening. Cardiomegaly without congestive failure. Aortic Atherosclerosis (ICD10-I70.0). Electronically Signed   By: Jeronimo Greaves M.D.   On: 06/10/2022 15:57    Microbiology: No results found for this or any previous visit (from the past 240 hour(s)).   Labs: Basic Metabolic Panel: Recent Labs  Lab 06/13/22 0246 06/14/22 0221 06/15/22 0219 06/16/22 0231 06/17/22 0216  NA 127* 125* 127* 128* 129*  K 3.9 3.9 4.2 4.3 3.7  CL 97* 95* 97* 99 97*  CO2 21* 22 20* 23 23  GLUCOSE 112* 112* 102* 100* 105*  BUN 32* 32* 33* 20 17  CREATININE 1.07* 0.82 0.74 0.63 0.59  CALCIUM 8.4* 8.4* 8.5* 8.4* 8.4*  MG  --   --   --   --  1.7  PHOS  --   --   --   --  3.1   Liver Function Tests: Recent Labs  Lab 06/10/22 1541 06/17/22 0216  AST 48* 135*  ALT 30 106*  ALKPHOS 74 99  BILITOT 0.8 0.9  PROT 6.8 6.0*  ALBUMIN 3.4* 2.7*   No results for input(s): "LIPASE", "AMYLASE" in the last 168 hours. No results for input(s): "AMMONIA" in the last 168 hours. CBC: Recent Labs  Lab 06/10/22 1541 06/10/22 1609 06/12/22 0121 06/17/22 0216  WBC 14.0*  --  8.8 10.4  NEUTROABS 13.3*  --   --  8.6*  HGB 12.0 12.6 11.9* 11.5*  HCT 35.6* 37.0 34.8* 32.6*  MCV 92.2  --  91.1 87.6  PLT 235  --  250 338   Cardiac Enzymes: Recent Labs  Lab 06/10/22 1541  CKTOTAL 210   BNP: BNP (last 3 results) Recent Labs    06/14/22 0221 06/16/22 0909  BNP 276.0* 249.0*    ProBNP (last 3 results) Recent Labs    10/02/21 1529  PROBNP 1,013*    CBG: No results for input(s): "GLUCAP" in the last 168 hours.     Signed:  Lacretia Nicks MD.  [facility] 06/17/2022, 12:18 PM

## 2022-06-17 NOTE — Assessment & Plan Note (Signed)
Follow outpatient, if not improving, would reevaluate meds including amiodarone

## 2022-06-17 NOTE — Progress Notes (Signed)
16 fr Foley catheter placed per MD order with sterile technique on first attempt. Pt tolerated well. 800-1000cc urine already emptied.

## 2022-06-17 NOTE — Progress Notes (Signed)
HOSPITAL MEDICINE OVERNIGHT EVENT NOTE    Notified by nursing that patient is complaining of lower abdominal discomfort with bladder scan revealing 634 cc in the bladder.  Intermittent catheterization attempted by 2 different nurses and unfortunately both were unsuccessful.  Rapid response was then called who came and was finally able to successfully place a Foley catheter.  An indwelling catheter was placed and had of intermittent catheterization due to the difficulty of placement.  Patient exhibited at least 800 to 1000 cc of urine output upon insertion of the Foley catheter.  Vernelle Emerald  MD [facility]

## 2022-06-17 NOTE — Plan of Care (Signed)

## 2022-06-17 NOTE — Progress Notes (Signed)
Occupational Therapy Treatment Patient Details Name: Maria [name] MRN: [id number] DOB: 28-Oct-1923 Today's Date: 06/17/2022   History of present illness Pt is a 88 y/o female who presented after mechanical fall at her [facility] with prolonged time down on floor. Pt found to have fx of multiple pubic rami (L), L distal radius fx - both treated nonsurgically. Pt also found with chronic fx of T10, L1, L2. PMH: a fib, HTN, CAD, CKD, GERD, macular degeneration   OT comments  Pt progressing overall towards acute OT goals. Limited by pain this session, 8/10 pelvic with LLE movement. Bed level activity this session and education on LUE sling. Planned to come back for 2nd session to work on functional transfers once pt premedicated but pt reading for d/c back to facility on second attempt. D/c recommendation remains appropriate.    Recommendations for follow up therapy are one component of a multi-disciplinary discharge planning process, led by the attending physician.  Recommendations may be updated based on patient status, additional functional criteria and insurance authorization.    Follow Up Recommendations  Skilled nursing-short term rehab (<3 hours/day) (pt would like to go to rehab associated with her ILF)    Assistance Recommended at Discharge Frequent or constant Supervision/Assistance  Patient can return home with the following  Two people to help with walking and/or transfers;Two people to help with bathing/dressing/bathroom   Equipment Recommendations  Other (comment) (L platform RW)    Recommendations for Other Services      Precautions / Restrictions Precautions Precautions: Fall;Other (comment) Precaution Comments: back precautions and sling for comfort Required Braces or Orthoses: Sling Restrictions Weight Bearing Restrictions: Yes LUE Weight Bearing: Weight bear through elbow only RLE Weight Bearing: Weight bearing as tolerated LLE Weight Bearing: Weight bearing as tolerated        Mobility Bed Mobility Overal bed mobility: Needs Assistance             General bed mobility comments: Pt advance BLE towards EOB with mod A. Onset of LLE and pelvic pain necessitating end of session. Pt requesting pain meds prior to mobilizing.    Transfers Overall transfer level: Needs assistance                 General transfer comment: unable to assess this session d/t 8/10 pain with minimally LLE movement in the bed     Balance                                           ADL either performed or assessed with clinical judgement   ADL Overall ADL's : Needs assistance/impaired                                       General ADL Comments: Donned sling and educated on positioning and use.    Extremity/Trunk Assessment Upper Extremity Assessment Upper Extremity Assessment: LUE deficits/detail LUE Deficits / Details: bandaged wrist to proximal elbow, digits swollen, able to extend but increased pain with minimal flexion attempts LUE Coordination: decreased fine motor   Lower Extremity Assessment Lower Extremity Assessment: Defer to PT evaluation        Vision       Perception     Praxis      Cognition Arousal/Alertness: Awake/alert Behavior During Therapy: [facility] for tasks  assessed/performed Overall Cognitive Status: Impaired/Different from baseline Area of Impairment: Awareness, Problem solving, Following commands                       Following Commands: Follows one step commands with increased time     Problem Solving: Slow processing, Decreased initiation, Difficulty sequencing, Requires verbal cues          Exercises      Shoulder Instructions       General Comments      Pertinent Vitals/ Pain       Pain Assessment Pain Assessment: Faces Faces Pain Scale: Hurts whole lot Pain Location: pelvis with LLE advancement at bed level Pain Descriptors / Indicators: Guarding, Grimacing, Moaning,  Sore Pain Intervention(s): Patient requesting pain meds-RN notified, Limited activity within patient's tolerance, Monitored during session  Home Living                                          Prior Functioning/Environment              Frequency  Min 2X/week        Progress Toward Goals  OT Goals(current goals can now be found in the care plan section)  Progress towards OT goals: Progressing toward goals (overall progress- limited this session by pain)  Acute Rehab OT Goals Patient Stated Goal: pain control, slowly increase mobility OT Goal Formulation: With patient/family Time For Goal Achievement: 06/25/22 Potential to Achieve Goals: Good ADL Goals Pt Will Perform Grooming: with set-up;sitting Pt Will Perform Upper Body Dressing: with min assist;sitting Pt Will Perform Lower Body Dressing: with min assist;sit to/from stand;sitting/lateral leans Pt Will Transfer to Toilet: with min assist;stand pivot transfer;bedside commode  Plan Discharge plan remains appropriate    Co-evaluation                 AM-PAC OT "6 Clicks" Daily Activity     Outcome Measure   Help from another person eating meals?: A Little Help from another person taking care of personal grooming?: A Little Help from another person toileting, which includes using toliet, bedpan, or urinal?: Total Help from another person bathing (including washing, rinsing, drying)?: A Lot Help from another person to put on and taking off regular upper body clothing?: A Lot Help from another person to put on and taking off regular lower body clothing?: Total 6 Click Score: 12    End of Session    OT Visit Diagnosis: Unsteadiness on feet (R26.81);Other abnormalities of gait and mobility (R26.89);Muscle weakness (generalized) (M62.81)   Activity Tolerance Patient limited by pain   Patient Left in bed;with call bell/phone within reach;with bed alarm set   Nurse Communication Patient  requests pain meds        Time: 8250-0370 OT Time Calculation (min): 15 min  Charges: OT General Charges $OT Visit: 1 Visit OT Treatments $Therapeutic Activity: 8-22 mins  Tyrone Schimke, OT Acute Rehabilitation Services Office: [phone]   Hortencia Pilar 06/17/2022, 2:03 PM

## 2022-06-17 NOTE — Plan of Care (Signed)

## 2022-06-24 DIAGNOSIS — R338 Other retention of urine: Secondary | ICD-10-CM | POA: Diagnosis not present

## 2022-06-24 DIAGNOSIS — S329XXD Fracture of unspecified parts of lumbosacral spine and pelvis, subsequent encounter for fracture with routine healing: Secondary | ICD-10-CM | POA: Diagnosis not present

## 2022-06-24 DIAGNOSIS — I480 Paroxysmal atrial fibrillation: Secondary | ICD-10-CM | POA: Diagnosis not present

## 2022-06-24 DIAGNOSIS — I5032 Chronic diastolic (congestive) heart failure: Secondary | ICD-10-CM | POA: Diagnosis not present

## 2022-06-24 DIAGNOSIS — I272 Pulmonary hypertension, unspecified: Secondary | ICD-10-CM | POA: Diagnosis not present

## 2022-06-24 DIAGNOSIS — K900 Celiac disease: Secondary | ICD-10-CM | POA: Diagnosis not present

## 2022-06-24 DIAGNOSIS — I25709 Atherosclerosis of coronary artery bypass graft(s), unspecified, with unspecified angina pectoris: Secondary | ICD-10-CM | POA: Diagnosis not present

## 2022-06-24 DIAGNOSIS — W19XXXD Unspecified fall, subsequent encounter: Secondary | ICD-10-CM | POA: Diagnosis not present

## 2022-06-24 DIAGNOSIS — I110 Hypertensive heart disease with heart failure: Secondary | ICD-10-CM | POA: Diagnosis not present

## 2022-06-24 DIAGNOSIS — E039 Hypothyroidism, unspecified: Secondary | ICD-10-CM | POA: Diagnosis not present

## 2022-07-01 DIAGNOSIS — Z09 Encounter for follow-up examination after completed treatment for conditions other than malignant neoplasm: Secondary | ICD-10-CM | POA: Diagnosis not present

## 2022-07-01 DIAGNOSIS — R222 Localized swelling, mass and lump, trunk: Secondary | ICD-10-CM | POA: Diagnosis not present

## 2022-07-01 DIAGNOSIS — R0989 Other specified symptoms and signs involving the circulatory and respiratory systems: Secondary | ICD-10-CM | POA: Diagnosis not present

## 2022-07-02 DIAGNOSIS — S32592A Other specified fracture of left pubis, initial encounter for closed fracture: Secondary | ICD-10-CM | POA: Diagnosis not present

## 2022-07-02 DIAGNOSIS — S52502A Unspecified fracture of the lower end of left radius, initial encounter for closed fracture: Secondary | ICD-10-CM | POA: Diagnosis not present

## 2022-07-03 DIAGNOSIS — J90 Pleural effusion, not elsewhere classified: Secondary | ICD-10-CM | POA: Diagnosis not present

## 2022-07-12 DIAGNOSIS — R338 Other retention of urine: Secondary | ICD-10-CM | POA: Diagnosis not present

## 2022-07-12 DIAGNOSIS — J984 Other disorders of lung: Secondary | ICD-10-CM | POA: Diagnosis not present

## 2022-07-12 DIAGNOSIS — R918 Other nonspecific abnormal finding of lung field: Secondary | ICD-10-CM | POA: Diagnosis not present

## 2022-07-12 DIAGNOSIS — R590 Localized enlarged lymph nodes: Secondary | ICD-10-CM | POA: Diagnosis not present

## 2022-07-12 DIAGNOSIS — J90 Pleural effusion, not elsewhere classified: Secondary | ICD-10-CM | POA: Diagnosis not present

## 2022-07-12 DIAGNOSIS — N312 Flaccid neuropathic bladder, not elsewhere classified: Secondary | ICD-10-CM | POA: Diagnosis not present

## 2022-07-18 NOTE — Progress Notes (Signed)
Cardiology Office Note:    Date:  07/19/2022   ID:  Maria [name], DOB [birth year], MRN [id number]  PCP:  [name], MD  Cardiologist:  [name], MD   Referring MD: [name], MD   Chief Complaint  Patient presents with   Congestive Heart Failure   Atrial Fibrillation   Hypertension   Cardiac Valve Problem   Hyperlipidemia    History of Present Illness:    Maria [name] is a 88 y.o. female with a hx of paroxysmal atrial fibrillation, amiodarone therapy, bilateral carotid stenosis, coronary artery disease with multivessel coronary bypass grafting 2014, chronic kidney disease, chronic anticoagulation with Xarelto, Celiac disease, and hypertension.  Recent TIA leading to switch from Xarelto to apixaban 2.5 mg twice daily.  Hospitalized at [facility] in June 2022.  Subsequently followed at [facility] where recent CT scan was performed.  The CT scan showed some alarming findings likely suggesting the presence of malignancy primary or metastatic.  Thoracentesis with analysis of fluid will be performed on Thursday.   She has paroxysmal's of cough.  No hemoptysis.  Appetite has been stable.  She had a spontaneous pelvic fracture that led to a fall with wrist fracture.  She was in the hospital several days for analgesia.  She developed pneumonia while in the hospital.  She was discharged from the hospital on June 10, 2022.  She is accompanied today by her daughter.  The patient is the wife of the late Guillermina City, MD.  Past Medical History:  Diagnosis Date   Anemia    Atonic bladder 12/11/2013   Carotid artery occlusion    right carotid stenosis 40-60%, 4/08, neg for stenosis repeat study 11/11   Celiac disease    Chronic anticoagulation CHADS2VASC2 score 7 10/18/2014   Chronic diastolic CHF (congestive heart failure) (HCC)    takes Lasix daily   CKD (chronic kidney disease), stage III ([place])    Coronary artery disease     a.  08/2013 (LIMA-LAD, rSVG to LCX and Lt atrial clip),   Depression    DNR (do not resuscitate) 03/28/2021   DVT (deep venous thrombosis) ([place]) 09/2013   Gastric ulcer    6-94month ago   GERD (gastroesophageal reflux disease)    takes Omeprazole daily   GI bleeding    Hemorrhoids    Hot flashes    takes ZOloft 3 times a week   Hypertension    Hypothyroidism    takes Synthroid daily as result of AMiodarone   Insomnia    Macular degeneration    Mild aortic insufficiency    a. Echo 10/2013: mild AI.   Moderate mitral regurgitation by prior echocardiogram    a. Echo 10/2013.   Moderate obstructive sleep apnea    uses CPAP;sleep study about 4-533monthago   MVP (mitral valve prolapse)    Osteoporosis    PAF (paroxysmal atrial fibrillation) (HCC)    Peripheral edema    left   Presbycusis    Pulmonary hypertension (HCC)    RLS (restless legs syndrome)    Thyroid nodule    58m54mno change 11/11   Urethral stricture    Urinary anastomotic stricture    Urinary retention    Vitamin D deficiency     Past Surgical History:  Procedure Laterality Date   APPENDECTOMY     bilateral cataract surgery     CARDIAC CATHETERIZATION  08-06-13   CORONARY ARTERY BYPASS GRAFT N/A 08/28/2013  Procedure: CORONARY ARTERY BYPASS GRAFTING (CABG) x2 using right greater saphenous vein and left internal mammary artery. ;  Surgeon: Grace Isaac, MD;  Location: [place];  Service: Open Heart Surgery;  Laterality: N/A;   HIP ARTHROPLASTY Right 12/09/2013   Procedure: ARTHROPLASTY BIPOLAR HIP CEMENTED;  Surgeon: Kerin Salen, MD;  Location: [place];  Service: Orthopedics;  Laterality: Right;   HIP PINNING,CANNULATED Left 03/30/2021   Procedure: CANNULATED HIP PINNING;  Surgeon: Marchia Bond, MD;  Location: [place];  Service: Orthopedics;  Laterality: Left;   INTRAOPERATIVE TRANSESOPHAGEAL ECHOCARDIOGRAM N/A 08/28/2013   Procedure: INTRAOPERATIVE TRANSESOPHAGEAL ECHOCARDIOGRAM;  Surgeon: Grace Isaac,  MD;  Location: [place];  Service: Open Heart Surgery;  Laterality: N/A;   LEFT AND RIGHT HEART CATHETERIZATION WITH CORONARY ANGIOGRAM N/A 08/16/2013   Procedure: LEFT AND RIGHT HEART CATHETERIZATION WITH CORONARY ANGIOGRAM;  Surgeon: [name], MD;  Location[facility] C[facility] LAB;  Service: Cardiovascular;  Laterality: N/A;   MANDIBLE SURGERY     TCS     TONSILLECTOMY     trigger thumb     TUBAL LIGATION      Current Medications: Current Meds  Medication Sig   acetaminophen (TYLENOL) 500 MG tablet Take 1 tablet (500 mg total) by mouth every 6 (six) hours as needed (pain).   amiodarone (PACERONE) 200 MG tablet TAKE 1 TABLET BY MOUTH DAILY (Patient taking differently: Take 200 mg by mouth at bedtime.)   apixaban (ELIQUIS) 2.5 MG TABS tablet Take 1 tablet (2.5 mg total) by mouth 2 (two) times daily.   cholecalciferol (VITAMIN D3) 10 MCG (400 UNIT) TABS tablet Take 1 tablet (400 Units total) by mouth daily.   diltiazem (CARDIZEM CD) 120 MG 24 hr capsule Take 1 capsule (120 mg total) by mouth daily.   furosemide (LASIX) 40 MG tablet Take 80 mg by mouth every other day, alternate with 40 mg by mouth every other day.   Levothyroxine Sodium 25 MCG CAPS Take 1 capsule by mouth daily before breakfast.   magnesium hydroxide (MILK OF MAGNESIA) 400 MG/5ML suspension Take 15 mLs by mouth at bedtime as needed for mild constipation.   melatonin 5 MG TABS Take 1-2 tablets (5-10 mg total) by mouth at bedtime.   Menthol, Topical Analgesic, (ICY HOT EX) Apply 1 application topically daily as needed (pain).   Nutritional Supplements (BOOST PO) Take by mouth.   ondansetron (ZOFRAN) 4 MG tablet Take 4 mg by mouth every 8 (eight) hours as needed for nausea or vomiting.   Polyethyl Glycol-Propyl Glycol (SYSTANE) 0.4-0.3 % SOLN Place 1 drop into both eyes at bedtime.   polyethylene glycol (MIRALAX) 17 g packet Take 17 g by mouth daily.   sertraline (ZOLOFT) 50 MG tablet Take 50 mg by mouth every morning.      Allergies:   Fentanyl; Amoxicillin; Atorvastatin; Bupropion; Codeine; Fosamax [alendronate sodium]; Gabapentin; Gluten meal; Hydrochlorothiazide; and Tetanus toxoid, adsorbed   Social History   Socioeconomic History   Marital status: Widowed    Spouse name: Not on file   Number of children: Not on file   Years of education: Not on file   Highest education level: Not on file  Occupational History   Occupation: retired  Tobacco Use   Smoking status: Never   Smokeless tobacco: Never  Vaping Use   Vaping Use: Never used  Substance and Sexual Activity   Alcohol use: No   Drug use: No   Sexual activity: Yes    Birth control/protection: Surgical  Other Topics Concern   Not on file  Social History Narrative   Lives in retirement community   Right handed   Social Determinants of Health   Financial Resource Strain: Not on file  Food Insecurity: Not on file  Transportation Needs: Not on file  Physical Activity: Not on file  Stress: Not on file  Social Connections: Not on file     Family History: The patient's family history includes AAA (abdominal aortic aneurysm) in her brother; CAD in her father; CVA in her mother; Colon cancer in her father; Heart attack in her brother, father, and mother; Peripheral vascular disease in her brother.  ROS:   Please see the history of present illness.    She has no complaints.  Pelvic and arm pain have improved greatly.  She is able to sit in the wheelchair without difficulty.  All other systems reviewed and are negative.  EKGs/Labs/Other Studies Reviewed:    The following studies were reviewed today: CT scan 07/14/2022: IMPRESSION: 1. Right lower lobe mass measures 3.5 x 4.2 x 4.0 cm. This is concerning for a primary lung neoplasm. Per Fleischner Society Guidelines, recommend a PET/CT or tissue sampling. These guidelines do not apply to immunocompromised patients and patients with cancer. Follow up in patients with significant  comorbidities as clinically warranted. For lung cancer screening, adhere to Lung-RADS guidelines. Reference: Radiology. 2017; 284(1):228-43. 2. Asymmetric right hilar adenopathy is incompletely characterized without IV contrast. This raises concern for metastatic disease. 3. Bilateral pleural effusions, right greater than left. 4. Associated airspace disease likely reflects atelectasis. Infection is not excluded. 5. Micronodular appearance in the posterior left upper lobe is concerning for neoplasm. 6. Cardiomegaly without failure. 7. Coronary artery disease. 8. Remote T10 inferior endplate fracture is stable. 9. Aortic Atherosclerosis (ICD10-I70.0).  These results will be called to the ordering clinician or representative by the Radiologist Assistant, and communication documented in the PACS or [employer].   EKG:  EKG performed June 13, 2022 demonstrated right bundle, atrial tachycardia at 140 bpm with 2-1 AV conduction, and left axis deviation/inferior Q waves.  When compared to the prior tracing from December 2022, atrial fibrillation is no longer present.  Recent Labs: 10/02/2021: NT-Pro BNP 1,013; TSH 6.130 06/16/2022: B Natriuretic Peptide 249.0 06/17/2022: ALT 106; BUN 17; Creatinine, Ser 0.59; Hemoglobin 11.5; Magnesium 1.7; Platelets 338; Potassium 3.7; Sodium 129  Recent Lipid Panel    Component Value Date/Time   CHOL 223 (H) 11/27/2018 0637   TRIG 93 11/27/2018 0637   HDL 74 11/27/2018 0637   CHOLHDL 3.0 11/27/2018 0637   VLDL 19 11/27/2018 0637   LDLCALC 130 (H) 11/27/2018 0637    Physical Exam:    VS:  BP (!) 114/52   Pulse 84   Ht 5' (1.524 m)   Wt 94 lb (42.6 kg)   SpO2 93%   BMI 18.36 kg/m     Wt Readings from Last 3 Encounters:  07/19/22 94 lb (42.6 kg)  07/19/22 94 lb (42.6 kg)  06/10/22 92 lb (41.7 kg)     GEN: She appears chronically ill with pallor.. No acute distress HEENT: Normal NECK: No JVD. LYMPHATICS: No lymphadenopathy CARDIAC:  2/6 right upper sternal systolic but no diastolic murmur.  Irregular rhythm with no gallop, or edema. VASCULAR:  Normal Pulses. No bruits. RESPIRATORY:  Clear to auscultation without rales, wheezing or rhonchi  ABDOMEN: Soft, non-tender, non-distended, No pulsatile mass, MUSCULOSKELETAL: No deformity  SKIN: Warm and dry NEUROLOGIC:  Alert and oriented x  3 PSYCHIATRIC:  Normal affect   ASSESSMENT:    1. Paroxysmal atrial fibrillation (HCC)   2. Chronic anticoagulation   3. On amiodarone therapy   4. Acute on chronic combined systolic and diastolic CHF (congestive heart failure) ([place])   5. Coronary artery disease involving coronary bypass graft of native heart with angina pectoris (HCC)   6. Stage 3 chronic kidney disease, unspecified whether stage 3a or 3b CKD (HCC)    PLAN:    In order of problems listed above:  Last EKG in August demonstrated an atrial tachycardia at approximately 140 bpm with 2-1 AV conduction.  This possibly represents slow atrial flutter in this patient who is on amiodarone therapy.  Amiodarone is being used to help with rate control.  With CT findings, some of the interstitial changes could be related to amiodarone.  This will need to be kept in mind. She has been instructed to discontinue anticoagulation 72 hours prior to the upcoming thoracentesis. Amiodarone can be discontinued if needed since she continues to have supraventricular arrhythmias despite amiodarone therapy.  It is being used now for rate control. No evidence of volume overload on exam. She denies angina pectoris.  She is status post coronary bypass surgery. Kidney function is normal when last evaluated in August.  Not sure if she will have to come back to cardiology.  CT scan is concerning for malignancy either primary or metastatic.  We will see what that work-up reveals.  I do not want to force unnecessary office visits and certainly if she has metastatic lung cancer/breast cancer or a primary at  her age, care should most likely end up being palliative.   Medication Adjustments/Labs and Tests Ordered: Current medicines are reviewed at length with the patient today.  Concerns regarding medicines are outlined above.  No orders of the defined types were placed in this encounter.  No orders of the defined types were placed in this encounter.   Patient Instructions  Medication Instructions:  Your physician recommends that you continue on your current medications as directed. Please refer to the Current Medication list given to you today.  *If you need a refill on your cardiac medications before your next appointment, please call your pharmacy*   Follow-Up: At [facility], you and your health needs are our priority.  As part of our continuing mission to provide you with exceptional heart care, we have created designated Provider Care Teams.  These Care Teams include your primary Cardiologist (physician) and Advanced Practice Providers (APPs -  Physician Assistants and Nurse Practitioners) who all work together to provide you with the care you need, when you need it.  We recommend signing up for the patient portal called "MyChart".  Sign up information is provided on this After Visit Summary.  MyChart is used to connect with patients for Virtual Visits (Telemedicine).  Patients are able to view lab/test results, encounter notes, upcoming appointments, etc.  Non-urgent messages can be sent to your provider as well.   To learn more about what you can do with MyChart, go to [email or web address].    Your next appointment:   6 month(s)  The format for your next appointment:   In Person  Provider:   [name], MD              Signed, [name], MD  07/19/2022 1:34 PM    [place]

## 2022-07-19 ENCOUNTER — Encounter: Payer: Self-pay | Admitting: Interventional Cardiology

## 2022-07-19 ENCOUNTER — Ambulatory Visit: Payer: Medicare Other | Attending: Interventional Cardiology | Admitting: Interventional Cardiology

## 2022-07-19 ENCOUNTER — Encounter: Payer: Self-pay | Admitting: Pulmonary Disease

## 2022-07-19 ENCOUNTER — Ambulatory Visit (INDEPENDENT_AMBULATORY_CARE_PROVIDER_SITE_OTHER): Payer: Medicare Other | Admitting: Pulmonary Disease

## 2022-07-19 VITALS — BP 114/52 | HR 84 | Ht 60.0 in | Wt 94.0 lb

## 2022-07-19 VITALS — BP 130/70 | HR 68 | Ht 60.0 in | Wt 94.0 lb

## 2022-07-19 DIAGNOSIS — Z7901 Long term (current) use of anticoagulants: Secondary | ICD-10-CM | POA: Insufficient documentation

## 2022-07-19 DIAGNOSIS — I480 Paroxysmal atrial fibrillation: Secondary | ICD-10-CM | POA: Insufficient documentation

## 2022-07-19 DIAGNOSIS — R918 Other nonspecific abnormal finding of lung field: Secondary | ICD-10-CM

## 2022-07-19 DIAGNOSIS — Z79899 Other long term (current) drug therapy: Secondary | ICD-10-CM | POA: Insufficient documentation

## 2022-07-19 DIAGNOSIS — I5043 Acute on chronic combined systolic (congestive) and diastolic (congestive) heart failure: Secondary | ICD-10-CM | POA: Insufficient documentation

## 2022-07-19 DIAGNOSIS — J90 Pleural effusion, not elsewhere classified: Secondary | ICD-10-CM | POA: Diagnosis not present

## 2022-07-19 DIAGNOSIS — Z789 Other specified health status: Secondary | ICD-10-CM

## 2022-07-19 DIAGNOSIS — N183 Chronic kidney disease, stage 3 unspecified: Secondary | ICD-10-CM | POA: Diagnosis not present

## 2022-07-19 DIAGNOSIS — I25709 Atherosclerosis of coronary artery bypass graft(s), unspecified, with unspecified angina pectoris: Secondary | ICD-10-CM | POA: Insufficient documentation

## 2022-07-19 NOTE — H&P (View-Only) (Signed)
Synopsis: Referred in September 2023 for lung mass by [name], MD  Subjective:   PATIENT ID: Maria [name] GENDER: female DOB: 1924-09-06, MRN: [id number]  Chief Complaint  Patient presents with   Consult    This is a 88 year old female, past medical history of carotid stenosis, chronic anticoagulation atrial fibrillation, chronic diastolic heart failure, moderate sleep apnea.Patient had coronary artery bypass grafting in 2014 she is a lifelong non-smoker.Patient was referred after abnormal CT imaging on 07/12/2022 which was completed at [facility].  This revealed a right lower lobe mass measuring 3.5 x 4.2 x 4.0 cm concerning for malignancy.  Also there was an associated right hilar adenopathy.  Patient has no complaints.  She still has ongoing cough that is getting a little bit better.  She is very weak after having a fall with hip and pelvic fracture.  She is in a wheelchair today she is nonweightbearing.    Past Medical History:  Diagnosis Date   Anemia    Atonic bladder 12/11/2013   Carotid artery occlusion    right carotid stenosis 40-60%, 4/08, neg for stenosis repeat study 11/11   Celiac disease    Chronic anticoagulation CHADS2VASC2 score 7 10/18/2014   Chronic diastolic CHF (congestive heart failure) (HCC)    takes Lasix daily   CKD (chronic kidney disease), stage III ([place])    Coronary artery disease    a.  08/2013 (LIMA-LAD, rSVG to LCX and Lt atrial clip),   Depression    DNR (do not resuscitate) 03/28/2021   DVT (deep venous thrombosis) ([place]) 09/2013   Gastric ulcer    6-55month ago   GERD (gastroesophageal reflux disease)    takes Omeprazole daily   GI bleeding    Hemorrhoids    Hot flashes    takes ZOloft 3 times a week   Hypertension    Hypothyroidism    takes Synthroid daily as result of AMiodarone   Insomnia    Macular degeneration    Mild aortic insufficiency    a. Echo 10/2013: mild AI.   Moderate mitral regurgitation by  prior echocardiogram    a. Echo 10/2013.   Moderate obstructive sleep apnea    uses CPAP;sleep study about 4-55monthago   MVP (mitral valve prolapse)    Osteoporosis    PAF (paroxysmal atrial fibrillation) (HCC)    Peripheral edema    left   Presbycusis    Pulmonary hypertension (HCC)    RLS (restless legs syndrome)    Thyroid nodule    31m69mno change 11/11   Urethral stricture    Urinary anastomotic stricture    Urinary retention    Vitamin D deficiency      Family History  Problem Relation Age of Onset   Heart attack Mother    CVA Mother    CAD Father    Colon cancer Father    Heart attack Father    Heart attack Brother    Peripheral vascular disease Brother    AAA (abdominal aortic aneurysm) Brother      Past Surgical History:  Procedure Laterality Date   APPENDECTOMY     bilateral cataract surgery     CARDIAC CATHETERIZATION  08-06-13   CORONARY ARTERY BYPASS GRAFT N/A 08/28/2013   Procedure: CORONARY ARTERY BYPASS GRAFTING (CABG) x2 using right greater saphenous vein and left internal mammary artery. ;  Surgeon: EdwGrace IsaacD;  [facility];  Laterality: N/A;  HIP ARTHROPLASTY Right 12/09/2013   Procedure: ARTHROPLASTY BIPOLAR HIP CEMENTED;  Surgeon: Kerin Salen, MD;  Location: [facility];  Service: Orthopedics;  Laterality: Right;   HIP PINNING,CANNULATED Left 03/30/2021   Procedure: CANNULATED HIP PINNING;  Surgeon: Marchia Bond, MD;  Location: [facility];  Service: Orthopedics;  Laterality: Left;   INTRAOPERATIVE TRANSESOPHAGEAL ECHOCARDIOGRAM N/A 08/28/2013   Procedure: INTRAOPERATIVE TRANSESOPHAGEAL ECHOCARDIOGRAM;  Surgeon: Grace Isaac, MD;  Location: [facility];  Service: Open Heart Surgery;  Laterality: N/A;   LEFT AND RIGHT HEART CATHETERIZATION WITH CORONARY ANGIOGRAM N/A 08/16/2013   Procedure: LEFT AND RIGHT HEART CATHETERIZATION WITH CORONARY ANGIOGRAM;  Surgeon: [name], MD;  Location: [facility] [place] CATH LAB;  Service:  Cardiovascular;  Laterality: N/A;   MANDIBLE SURGERY     TCS     TONSILLECTOMY     trigger thumb     TUBAL LIGATION      Social History   Socioeconomic History   Marital status: Widowed    Spouse name: Not on file   Number of children: Not on file   Years of education: Not on file   Highest education level: Not on file  Occupational History   Occupation: retired  Tobacco Use   Smoking status: Never   Smokeless tobacco: Never  Vaping Use   Vaping Use: Never used  Substance and Sexual Activity   Alcohol use: No   Drug use: No   Sexual activity: Yes    Birth control/protection: Surgical  Other Topics Concern   Not on file  Social History Narrative   Lives in retirement community   Right handed   Social Determinants of Health   Financial Resource Strain: Not on file  Food Insecurity: Not on file  Transportation Needs: Not on file  Physical Activity: Not on file  Stress: Not on file  Social Connections: Not on file  Intimate Partner Violence: Not on file     Allergies  Allergen Reactions   Fentanyl Nausea And Vomiting    Severe n/v   Amoxicillin Nausea Only and Other (See Comments)    dizziness   Atorvastatin Other (See Comments)    Unknown reaction - reported by Sadie Haber   Bupropion Other (See Comments)    nightmares   Codeine Nausea And Vomiting   Fosamax [Alendronate Sodium] Other (See Comments)    Jaw pain   Gabapentin Other (See Comments)    dizziness   Gluten Meal Other (See Comments)    Celiac Disease    Hydrochlorothiazide Other (See Comments)    Hyponatremia, GI upset    Tetanus Toxoid, Adsorbed Swelling    Severe swelling at site of injection (pt tolerates 1/2 injection then 1/2 week later)     Outpatient Medications Prior to Visit  Medication Sig Dispense Refill   acetaminophen (TYLENOL) 500 MG tablet Take 1 tablet (500 mg total) by mouth every 6 (six) hours as needed (pain). 30 tablet 0   amiodarone (PACERONE) 200 MG tablet TAKE 1 TABLET BY  MOUTH DAILY (Patient taking differently: Take 200 mg by mouth at bedtime.) 90 tablet 2   apixaban (ELIQUIS) 2.5 MG TABS tablet Take 1 tablet (2.5 mg total) by mouth 2 (two) times daily. 60 tablet 0   cholecalciferol (VITAMIN D3) 10 MCG (400 UNIT) TABS tablet Take 1 tablet (400 Units total) by mouth daily. 30 tablet 0   diltiazem (CARDIZEM CD) 120 MG 24 hr capsule Take 1 capsule (120 mg total) by mouth daily. 90 capsule 3  furosemide (LASIX) 40 MG tablet Take 80 mg by mouth every other day, alternate with 40 mg by mouth every other day. 135 tablet 3   Levothyroxine Sodium 25 MCG CAPS Take 1 capsule by mouth daily before breakfast.     magnesium hydroxide (MILK OF MAGNESIA) 400 MG/5ML suspension Take 15 mLs by mouth at bedtime as needed for mild constipation.     melatonin 5 MG TABS Take 1-2 tablets (5-10 mg total) by mouth at bedtime.  0   Menthol, Topical Analgesic, (ICY HOT EX) Apply 1 application topically daily as needed (pain).     Nutritional Supplements (BOOST PO) Take by mouth.     Polyethyl Glycol-Propyl Glycol (SYSTANE) 0.4-0.3 % SOLN Place 1 drop into both eyes at bedtime.     polyethylene glycol (MIRALAX) 17 g packet Take 17 g by mouth daily. 14 each 0   sertraline (ZOLOFT) 50 MG tablet Take 50 mg by mouth every morning.     No facility-administered medications prior to visit.    Review of Systems  Constitutional:  Negative for chills, fever, malaise/fatigue and weight loss.  HENT:  Negative for hearing loss, sore throat and tinnitus.   Eyes:  Negative for blurred vision and double vision.  Respiratory:  Positive for shortness of breath. Negative for cough, hemoptysis, sputum production, wheezing and stridor.   Cardiovascular:  Negative for chest pain, palpitations, orthopnea, leg swelling and PND.  Gastrointestinal:  Negative for abdominal pain, constipation, diarrhea, heartburn, nausea and vomiting.  Genitourinary:  Negative for dysuria, hematuria and urgency.  Musculoskeletal:   Negative for joint pain and myalgias.  Skin:  Negative for itching and rash.  Neurological:  Negative for dizziness, tingling, weakness and headaches.  Endo/Heme/Allergies:  Negative for environmental allergies. Does not bruise/bleed easily.  Psychiatric/Behavioral:  Negative for depression. The patient is not nervous/anxious and does not have insomnia.   All other systems reviewed and are negative.    Objective:  Physical Exam Vitals reviewed.  Constitutional:      General: She is not in acute distress.    Appearance: She is well-developed.  HENT:     Head: Normocephalic and atraumatic.     Mouth/Throat:     Pharynx: No oropharyngeal exudate.  Eyes:     Conjunctiva/sclera: Conjunctivae normal.     Pupils: Pupils are equal, round, and reactive to light.  Neck:     Vascular: No JVD.     Trachea: No tracheal deviation.     Comments: Loss of supraclavicular fat Cardiovascular:     Rate and Rhythm: Normal rate and regular rhythm.     Heart sounds: S1 normal and S2 normal.     Comments: Distant heart tones Pulmonary:     Effort: No tachypnea or accessory muscle usage.     Breath sounds: No stridor. Decreased breath sounds (throughout all lung fields) present. No wheezing, rhonchi or rales.  Abdominal:     General: Bowel sounds are normal. There is no distension.     Palpations: Abdomen is soft.     Tenderness: There is no abdominal tenderness.  Musculoskeletal:        General: Deformity (muscle wasting ) present.     Comments: Severe kyphoscoliosis  Skin:    General: Skin is warm and dry.     Capillary Refill: Capillary refill takes less than 2 seconds.     Findings: No rash.  Neurological:     Mental Status: She is alert and oriented to person, place, and time.  Psychiatric:        Behavior: Behavior normal.      Vitals:   07/19/22 1138  BP: 130/70  Pulse: 68  SpO2: 93%  Weight: 94 lb (42.6 kg)  Height: 5' (1.524 m)   93% on RA BMI Readings from Last 3  Encounters:  07/19/22 18.36 kg/m  06/10/22 17.97 kg/m  03/07/22 19.14 kg/m   Wt Readings from Last 3 Encounters:  07/19/22 94 lb (42.6 kg)  06/10/22 92 lb (41.7 kg)  03/07/22 98 lb (44.5 kg)     CBC    Component Value Date/Time   WBC 10.4 06/17/2022 0216   RBC 3.72 (L) 06/17/2022 0216   HGB 11.5 (L) 06/17/2022 0216   HGB 10.4 (L) 10/02/2021 1529   HCT 32.6 (L) 06/17/2022 0216   HCT 32.2 (L) 10/02/2021 1529   PLT 338 06/17/2022 0216   PLT 150 10/02/2021 1529   MCV 87.6 06/17/2022 0216   MCV 82 10/02/2021 1529   MCH 30.9 06/17/2022 0216   MCHC 35.3 06/17/2022 0216   RDW 14.6 06/17/2022 0216   RDW 14.3 10/02/2021 1529   LYMPHSABS 0.8 06/17/2022 0216   MONOABS 0.8 06/17/2022 0216   EOSABS 0.1 06/17/2022 0216   BASOSABS 0.1 06/17/2022 0216    Chest Imaging:  CT chest without contrast 07/12/2022 revealed a 3.5 x 4.2 x 4 cm right lower lobe lung mass concerning for primary bronchogenic carcinoma.  Epic report located within care everywhere.  Imaging was completed at [place].  Pulmonary Functions Testing Results:     No data to display          FeNO:   Pathology:   Echocardiogram:   Heart Catheterization:     Assessment & Plan:     ICD-10-CM   1. Right lower lobe lung mass  R91.8 Ambulatory referral to Pulmonology    Procedural/ Surgical Case Request: THORACENTESIS    NM PET Image Initial (PI) Skull Base To Thigh (F-18 FDG)    2. Non-smoker  Z78.9 NM PET Image Initial (PI) Skull Base To Thigh (F-18 FDG)    3. Pleural effusion, right  J90 Ambulatory referral to Pulmonology    Procedural/ Surgical Case Request: THORACENTESIS      Discussion: This is a 88 year old female, right lower lobe lung mass on recent CT imaging with associated right-sided effusion.  She has ongoing cough.  She is lifelong non-smoker.  Plan: I think first best step would include thoracentesis. We will get her set up for thoracentesis on Thursday. Hold  Eliquis starting last dose today. Plan for outpatient [facility] with fluid and cytology analysis. We will also order a PET scan to be done ASAP. We will have her follow-up with me or SG, NP after PET scan complete. RTC in approximately 3 weeks after imaging complete   Current Outpatient Medications:    acetaminophen (TYLENOL) 500 MG tablet, Take 1 tablet (500 mg total) by mouth every 6 (six) hours as needed (pain)., Disp: 30 tablet, Rfl: 0   amiodarone (PACERONE) 200 MG tablet, TAKE 1 TABLET BY MOUTH DAILY (Patient taking differently: Take 200 mg by mouth at bedtime.), Disp: 90 tablet, Rfl: 2   apixaban (ELIQUIS) 2.5 MG TABS tablet, Take 1 tablet (2.5 mg total) by mouth 2 (two) times daily., Disp: 60 tablet, Rfl: 0   cholecalciferol (VITAMIN D3) 10 MCG (400 UNIT) TABS tablet, Take 1 tablet (400 Units total) by mouth daily., Disp: 30 tablet, Rfl: 0   diltiazem (CARDIZEM CD)  120 MG 24 hr capsule, Take 1 capsule (120 mg total) by mouth daily., Disp: 90 capsule, Rfl: 3   furosemide (LASIX) 40 MG tablet, Take 80 mg by mouth every other day, alternate with 40 mg by mouth every other day., Disp: 135 tablet, Rfl: 3   Levothyroxine Sodium 25 MCG CAPS, Take 1 capsule by mouth daily before breakfast., Disp: , Rfl:    magnesium hydroxide (MILK OF MAGNESIA) 400 MG/5ML suspension, Take 15 mLs by mouth at bedtime as needed for mild constipation., Disp: , Rfl:    melatonin 5 MG TABS, Take 1-2 tablets (5-10 mg total) by mouth at bedtime., Disp: , Rfl: 0   Menthol, Topical Analgesic, (ICY HOT EX), Apply 1 application topically daily as needed (pain)., Disp: , Rfl:    Nutritional Supplements (BOOST PO), Take by mouth., Disp: , Rfl:    Polyethyl Glycol-Propyl Glycol (SYSTANE) 0.4-0.3 % SOLN, Place 1 drop into both eyes at bedtime., Disp: , Rfl:    polyethylene glycol (MIRALAX) 17 g packet, Take 17 g by mouth daily., Disp: 14 each, Rfl: 0   sertraline (ZOLOFT) 50 MG tablet, Take 50 mg by mouth every morning., Disp: ,  Rfl:   I spent 61 minutes dedicated to the care of this patient on the date of this encounter to include pre-visit review of records, face-to-face time with the patient discussing conditions above, post visit ordering of testing, clinical documentation with the electronic health record, making appropriate referrals as documented, and communicating necessary findings to members of the patients care team.   Garner Nash, DO [facility] 07/19/2022 12:15 PM

## 2022-07-19 NOTE — Patient Instructions (Addendum)
Thank you for visiting Dr. Valeta Harms at [facility]. Today we recommend the following:  Orders Placed This Encounter  Procedures   Procedural/ Surgical Case Request: THORACENTESIS   NM PET Image Initial (PI) Skull Base To Thigh (F-18 FDG)   Ambulatory referral to Pulmonology   Please have patient follow-up with me or SG, NP after PET scan complete.  Return in about 3 weeks (around 08/09/2022) for with Eric Form, NP, or Dr. Valeta Harms.    Please do your part to reduce the spread of COVID-19.

## 2022-07-19 NOTE — Patient Instructions (Signed)
Medication Instructions:  Your physician recommends that you continue on your current medications as directed. Please refer to the Current Medication list given to you today.  *If you need a refill on your cardiac medications before your next appointment, please call your pharmacy*   Follow-Up: At [facility], you and your health needs are our priority.  As part of our continuing mission to provide you with exceptional heart care, we have created designated Provider Care Teams.  These Care Teams include your primary Cardiologist (physician) and Advanced Practice Providers (APPs -  Physician Assistants and Nurse Practitioners) who all work together to provide you with the care you need, when you need it.  We recommend signing up for the patient portal called "MyChart".  Sign up information is provided on this After Visit Summary.  MyChart is used to connect with patients for Virtual Visits (Telemedicine).  Patients are able to view lab/test results, encounter notes, upcoming appointments, etc.  Non-urgent messages can be sent to your provider as well.   To learn more about what you can do with MyChart, go to [email or web address].    Your next appointment:   6 month(s)  The format for your next appointment:   In Person  Provider:   [name], MD

## 2022-07-19 NOTE — Progress Notes (Signed)
Synopsis: Referred in September 2023 for lung mass by [name], MD  Subjective:   PATIENT ID: Maria [name] GENDER: female DOB: 1924-09-10, MRN: [id number]  Chief Complaint  Patient presents with   Consult    This is a 88 year old female, past medical history of carotid stenosis, chronic anticoagulation atrial fibrillation, chronic diastolic heart failure, moderate sleep apnea.Patient had coronary artery bypass grafting in 2014 she is a lifelong non-smoker.Patient was referred after abnormal CT imaging on 07/12/2022 which was completed at [facility].  This revealed a right lower lobe mass measuring 3.5 x 4.2 x 4.0 cm concerning for malignancy.  Also there was an associated right hilar adenopathy.  Patient has no complaints.  She still has ongoing cough that is getting a little bit better.  She is very weak after having a fall with hip and pelvic fracture.  She is in a wheelchair today she is nonweightbearing.    Past Medical History:  Diagnosis Date   Anemia    Atonic bladder 12/11/2013   Carotid artery occlusion    right carotid stenosis 40-60%, 4/08, neg for stenosis repeat study 11/11   Celiac disease    Chronic anticoagulation CHADS2VASC2 score 7 10/18/2014   Chronic diastolic CHF (congestive heart failure) (HCC)    takes Lasix daily   CKD (chronic kidney disease), stage III ([place])    Coronary artery disease    a.  08/2013 (LIMA-LAD, rSVG to LCX and Lt atrial clip),   Depression    DNR (do not resuscitate) 03/28/2021   DVT (deep venous thrombosis) ([place]) 09/2013   Gastric ulcer    6-89month ago   GERD (gastroesophageal reflux disease)    takes Omeprazole daily   GI bleeding    Hemorrhoids    Hot flashes    takes ZOloft 3 times a week   Hypertension    Hypothyroidism    takes Synthroid daily as result of AMiodarone   Insomnia    Macular degeneration    Mild aortic insufficiency    a. Echo 10/2013: mild AI.   Moderate mitral regurgitation by  prior echocardiogram    a. Echo 10/2013.   Moderate obstructive sleep apnea    uses CPAP;sleep study about 4-558monthago   MVP (mitral valve prolapse)    Osteoporosis    PAF (paroxysmal atrial fibrillation) (HCC)    Peripheral edema    left   Presbycusis    Pulmonary hypertension (HCC)    RLS (restless legs syndrome)    Thyroid nodule    70m16mno change 11/11   Urethral stricture    Urinary anastomotic stricture    Urinary retention    Vitamin D deficiency      Family History  Problem Relation Age of Onset   Heart attack Mother    CVA Mother    CAD Father    Colon cancer Father    Heart attack Father    Heart attack Brother    Peripheral vascular disease Brother    AAA (abdominal aortic aneurysm) Brother      Past Surgical History:  Procedure Laterality Date   APPENDECTOMY     bilateral cataract surgery     CARDIAC CATHETERIZATION  08-06-13   CORONARY ARTERY BYPASS GRAFT N/A 08/28/2013   Procedure: CORONARY ARTERY BYPASS GRAFTING (CABG) x2 using right greater saphenous vein and left internal mammary artery. ;  Surgeon: EdwGrace IsaacD;  [facility];  Laterality: N/A;  HIP ARTHROPLASTY Right 12/09/2013   Procedure: ARTHROPLASTY BIPOLAR HIP CEMENTED;  Surgeon: Kerin Salen, MD;  Location: [place];  Service: Orthopedics;  Laterality: Right;   HIP PINNING,CANNULATED Left 03/30/2021   Procedure: CANNULATED HIP PINNING;  Surgeon: Marchia Bond, MD;  Location: [place];  Service: Orthopedics;  Laterality: Left;   INTRAOPERATIVE TRANSESOPHAGEAL ECHOCARDIOGRAM N/A 08/28/2013   Procedure: INTRAOPERATIVE TRANSESOPHAGEAL ECHOCARDIOGRAM;  Surgeon: Grace Isaac, MD;  Location: [place];  Service: Open Heart Surgery;  Laterality: N/A;   LEFT AND RIGHT HEART CATHETERIZATION WITH CORONARY ANGIOGRAM N/A 08/16/2013   Procedure: LEFT AND RIGHT HEART CATHETERIZATION WITH CORONARY ANGIOGRAM;  Surgeon: [name], MD;  Location: [facility] CATH LAB;  Service:  Cardiovascular;  Laterality: N/A;   MANDIBLE SURGERY     TCS     TONSILLECTOMY     trigger thumb     TUBAL LIGATION      Social History   Socioeconomic History   Marital status: Widowed    Spouse name: Not on file   Number of children: Not on file   Years of education: Not on file   Highest education level: Not on file  Occupational History   Occupation: retired  Tobacco Use   Smoking status: Never   Smokeless tobacco: Never  Vaping Use   Vaping Use: Never used  Substance and Sexual Activity   Alcohol use: No   Drug use: No   Sexual activity: Yes    Birth control/protection: Surgical  Other Topics Concern   Not on file  Social History Narrative   Lives in retirement community   Right handed   Social Determinants of Health   Financial Resource Strain: Not on file  Food Insecurity: Not on file  Transportation Needs: Not on file  Physical Activity: Not on file  Stress: Not on file  Social Connections: Not on file  Intimate Partner Violence: Not on file     Allergies  Allergen Reactions   Fentanyl Nausea And Vomiting    Severe n/v   Amoxicillin Nausea Only and Other (See Comments)    dizziness   Atorvastatin Other (See Comments)    Unknown reaction - reported by Sadie Haber   Bupropion Other (See Comments)    nightmares   Codeine Nausea And Vomiting   Fosamax [Alendronate Sodium] Other (See Comments)    Jaw pain   Gabapentin Other (See Comments)    dizziness   Gluten Meal Other (See Comments)    Celiac Disease    Hydrochlorothiazide Other (See Comments)    Hyponatremia, GI upset    Tetanus Toxoid, Adsorbed Swelling    Severe swelling at site of injection (pt tolerates 1/2 injection then 1/2 week later)     Outpatient Medications Prior to Visit  Medication Sig Dispense Refill   acetaminophen (TYLENOL) 500 MG tablet Take 1 tablet (500 mg total) by mouth every 6 (six) hours as needed (pain). 30 tablet 0   amiodarone (PACERONE) 200 MG tablet TAKE 1 TABLET BY  MOUTH DAILY (Patient taking differently: Take 200 mg by mouth at bedtime.) 90 tablet 2   apixaban (ELIQUIS) 2.5 MG TABS tablet Take 1 tablet (2.5 mg total) by mouth 2 (two) times daily. 60 tablet 0   cholecalciferol (VITAMIN D3) 10 MCG (400 UNIT) TABS tablet Take 1 tablet (400 Units total) by mouth daily. 30 tablet 0   diltiazem (CARDIZEM CD) 120 MG 24 hr capsule Take 1 capsule (120 mg total) by mouth daily. 90 capsule 3  furosemide (LASIX) 40 MG tablet Take 80 mg by mouth every other day, alternate with 40 mg by mouth every other day. 135 tablet 3   Levothyroxine Sodium 25 MCG CAPS Take 1 capsule by mouth daily before breakfast.     magnesium hydroxide (MILK OF MAGNESIA) 400 MG/5ML suspension Take 15 mLs by mouth at bedtime as needed for mild constipation.     melatonin 5 MG TABS Take 1-2 tablets (5-10 mg total) by mouth at bedtime.  0   Menthol, Topical Analgesic, (ICY HOT EX) Apply 1 application topically daily as needed (pain).     Nutritional Supplements (BOOST PO) Take by mouth.     Polyethyl Glycol-Propyl Glycol (SYSTANE) 0.4-0.3 % SOLN Place 1 drop into both eyes at bedtime.     polyethylene glycol (MIRALAX) 17 g packet Take 17 g by mouth daily. 14 each 0   sertraline (ZOLOFT) 50 MG tablet Take 50 mg by mouth every morning.     No facility-administered medications prior to visit.    Review of Systems  Constitutional:  Negative for chills, fever, malaise/fatigue and weight loss.  HENT:  Negative for hearing loss, sore throat and tinnitus.   Eyes:  Negative for blurred vision and double vision.  Respiratory:  Positive for shortness of breath. Negative for cough, hemoptysis, sputum production, wheezing and stridor.   Cardiovascular:  Negative for chest pain, palpitations, orthopnea, leg swelling and PND.  Gastrointestinal:  Negative for abdominal pain, constipation, diarrhea, heartburn, nausea and vomiting.  Genitourinary:  Negative for dysuria, hematuria and urgency.  Musculoskeletal:   Negative for joint pain and myalgias.  Skin:  Negative for itching and rash.  Neurological:  Negative for dizziness, tingling, weakness and headaches.  Endo/Heme/Allergies:  Negative for environmental allergies. Does not bruise/bleed easily.  Psychiatric/Behavioral:  Negative for depression. The patient is not nervous/anxious and does not have insomnia.   All other systems reviewed and are negative.    Objective:  Physical Exam Vitals reviewed.  Constitutional:      General: She is not in acute distress.    Appearance: She is well-developed.  HENT:     Head: Normocephalic and atraumatic.     Mouth/Throat:     Pharynx: No oropharyngeal exudate.  Eyes:     Conjunctiva/sclera: Conjunctivae normal.     Pupils: Pupils are equal, round, and reactive to light.  Neck:     Vascular: No JVD.     Trachea: No tracheal deviation.     Comments: Loss of supraclavicular fat Cardiovascular:     Rate and Rhythm: Normal rate and regular rhythm.     Heart sounds: S1 normal and S2 normal.     Comments: Distant heart tones Pulmonary:     Effort: No tachypnea or accessory muscle usage.     Breath sounds: No stridor. Decreased breath sounds (throughout all lung fields) present. No wheezing, rhonchi or rales.  Abdominal:     General: Bowel sounds are normal. There is no distension.     Palpations: Abdomen is soft.     Tenderness: There is no abdominal tenderness.  Musculoskeletal:        General: Deformity (muscle wasting ) present.     Comments: Severe kyphoscoliosis  Skin:    General: Skin is warm and dry.     Capillary Refill: Capillary refill takes less than 2 seconds.     Findings: No rash.  Neurological:     Mental Status: She is alert and oriented to person, place, and time.  Psychiatric:        Behavior: Behavior normal.      Vitals:   07/19/22 1138  BP: 130/70  Pulse: 68  SpO2: 93%  Weight: 94 lb (42.6 kg)  Height: 5' (1.524 m)   93% on RA BMI Readings from Last 3  Encounters:  07/19/22 18.36 kg/m  06/10/22 17.97 kg/m  03/07/22 19.14 kg/m   Wt Readings from Last 3 Encounters:  07/19/22 94 lb (42.6 kg)  06/10/22 92 lb (41.7 kg)  03/07/22 98 lb (44.5 kg)     CBC    Component Value Date/Time   WBC 10.4 06/17/2022 0216   RBC 3.72 (L) 06/17/2022 0216   HGB 11.5 (L) 06/17/2022 0216   HGB 10.4 (L) 10/02/2021 1529   HCT 32.6 (L) 06/17/2022 0216   HCT 32.2 (L) 10/02/2021 1529   PLT 338 06/17/2022 0216   PLT 150 10/02/2021 1529   MCV 87.6 06/17/2022 0216   MCV 82 10/02/2021 1529   MCH 30.9 06/17/2022 0216   MCHC 35.3 06/17/2022 0216   RDW 14.6 06/17/2022 0216   RDW 14.3 10/02/2021 1529   LYMPHSABS 0.8 06/17/2022 0216   MONOABS 0.8 06/17/2022 0216   EOSABS 0.1 06/17/2022 0216   BASOSABS 0.1 06/17/2022 0216    Chest Imaging:  CT chest without contrast 07/12/2022 revealed a 3.5 x 4.2 x 4 cm right lower lobe lung mass concerning for primary bronchogenic carcinoma.  Epic report located within care everywhere.  Imaging was completed at [place].  Pulmonary Functions Testing Results:     No data to display          FeNO:   Pathology:   Echocardiogram:   Heart Catheterization:     Assessment & Plan:     ICD-10-CM   1. Right lower lobe lung mass  R91.8 Ambulatory referral to Pulmonology    Procedural/ Surgical Case Request: THORACENTESIS    NM PET Image Initial (PI) Skull Base To Thigh (F-18 FDG)    2. Non-smoker  Z78.9 NM PET Image Initial (PI) Skull Base To Thigh (F-18 FDG)    3. Pleural effusion, right  J90 Ambulatory referral to Pulmonology    Procedural/ Surgical Case Request: THORACENTESIS      Discussion: This is a 88 year old female, right lower lobe lung mass on recent CT imaging with associated right-sided effusion.  She has ongoing cough.  She is lifelong non-smoker.  Plan: I think first best step would include thoracentesis. We will get her set up for thoracentesis on Thursday. Hold  Eliquis starting last dose today. Plan for outpatient [facility] with fluid and cytology analysis. We will also order a PET scan to be done ASAP. We will have her follow-up with me or SG, NP after PET scan complete. RTC in approximately 3 weeks after imaging complete   Current Outpatient Medications:    acetaminophen (TYLENOL) 500 MG tablet, Take 1 tablet (500 mg total) by mouth every 6 (six) hours as needed (pain)., Disp: 30 tablet, Rfl: 0   amiodarone (PACERONE) 200 MG tablet, TAKE 1 TABLET BY MOUTH DAILY (Patient taking differently: Take 200 mg by mouth at bedtime.), Disp: 90 tablet, Rfl: 2   apixaban (ELIQUIS) 2.5 MG TABS tablet, Take 1 tablet (2.5 mg total) by mouth 2 (two) times daily., Disp: 60 tablet, Rfl: 0   cholecalciferol (VITAMIN D3) 10 MCG (400 UNIT) TABS tablet, Take 1 tablet (400 Units total) by mouth daily., Disp: 30 tablet, Rfl: 0   diltiazem (CARDIZEM CD)  120 MG 24 hr capsule, Take 1 capsule (120 mg total) by mouth daily., Disp: 90 capsule, Rfl: 3   furosemide (LASIX) 40 MG tablet, Take 80 mg by mouth every other day, alternate with 40 mg by mouth every other day., Disp: 135 tablet, Rfl: 3   Levothyroxine Sodium 25 MCG CAPS, Take 1 capsule by mouth daily before breakfast., Disp: , Rfl:    magnesium hydroxide (MILK OF MAGNESIA) 400 MG/5ML suspension, Take 15 mLs by mouth at bedtime as needed for mild constipation., Disp: , Rfl:    melatonin 5 MG TABS, Take 1-2 tablets (5-10 mg total) by mouth at bedtime., Disp: , Rfl: 0   Menthol, Topical Analgesic, (ICY HOT EX), Apply 1 application topically daily as needed (pain)., Disp: , Rfl:    Nutritional Supplements (BOOST PO), Take by mouth., Disp: , Rfl:    Polyethyl Glycol-Propyl Glycol (SYSTANE) 0.4-0.3 % SOLN, Place 1 drop into both eyes at bedtime., Disp: , Rfl:    polyethylene glycol (MIRALAX) 17 g packet, Take 17 g by mouth daily., Disp: 14 each, Rfl: 0   sertraline (ZOLOFT) 50 MG tablet, Take 50 mg by mouth every morning., Disp: ,  Rfl:   I spent 61 minutes dedicated to the care of this patient on the date of this encounter to include pre-visit review of records, face-to-face time with the patient discussing conditions above, post visit ordering of testing, clinical documentation with the electronic health record, making appropriate referrals as documented, and communicating necessary findings to members of the patients care team.   Garner Nash, DO [facility] 07/19/2022 12:15 PM

## 2022-07-22 ENCOUNTER — Encounter (HOSPITAL_COMMUNITY)
Admission: RE | Disposition: A | Discharge status: Home / Self Care | Payer: Self-pay | Source: Home / Self Care | Attending: Internal Medicine

## 2022-07-22 ENCOUNTER — Ambulatory Visit (HOSPITAL_COMMUNITY): Payer: Medicare Other

## 2022-07-22 ENCOUNTER — Encounter (HOSPITAL_COMMUNITY): Payer: Self-pay | Admitting: Internal Medicine

## 2022-07-22 ENCOUNTER — Ambulatory Visit (HOSPITAL_COMMUNITY)
Admission: RE | Admit: 2022-07-22 | Discharge: 2022-07-22 | Disposition: A | Discharge status: Home / Self Care | Payer: Medicare Other | Attending: Internal Medicine | Admitting: Internal Medicine

## 2022-07-22 DIAGNOSIS — R918 Other nonspecific abnormal finding of lung field: Secondary | ICD-10-CM | POA: Diagnosis present

## 2022-07-22 DIAGNOSIS — G4733 Obstructive sleep apnea (adult) (pediatric): Secondary | ICD-10-CM | POA: Insufficient documentation

## 2022-07-22 DIAGNOSIS — Z951 Presence of aortocoronary bypass graft: Secondary | ICD-10-CM | POA: Diagnosis not present

## 2022-07-22 DIAGNOSIS — J90 Pleural effusion, not elsewhere classified: Secondary | ICD-10-CM | POA: Diagnosis present

## 2022-07-22 DIAGNOSIS — I482 Chronic atrial fibrillation, unspecified: Secondary | ICD-10-CM | POA: Diagnosis not present

## 2022-07-22 DIAGNOSIS — I5030 Unspecified diastolic (congestive) heart failure: Secondary | ICD-10-CM | POA: Insufficient documentation

## 2022-07-22 DIAGNOSIS — I6529 Occlusion and stenosis of unspecified carotid artery: Secondary | ICD-10-CM | POA: Insufficient documentation

## 2022-07-22 DIAGNOSIS — Z7901 Long term (current) use of anticoagulants: Secondary | ICD-10-CM | POA: Diagnosis not present

## 2022-07-22 DIAGNOSIS — J9811 Atelectasis: Secondary | ICD-10-CM | POA: Diagnosis not present

## 2022-07-22 HISTORY — PX: THORACENTESIS: SHX235

## 2022-07-22 LAB — PROTEIN, PLEURAL OR PERITONEAL FLUID: Total protein, fluid: <3 g/dL

## 2022-07-22 LAB — BODY FLUID CELL COUNT WITH DIFFERENTIAL
Eos, Fluid: 0 %
Lymphs, Fluid: 23 %
Monocyte-Macrophage-Serous Fluid: 15 % — ABNORMAL LOW (ref 50–90)
Neutrophil Count, Fluid: 62 % — ABNORMAL HIGH (ref 0–25)
Total Nucleated Cell Count, Fluid: 726 cu mm (ref 0–1000)

## 2022-07-22 LAB — ALBUMIN, PLEURAL OR PERITONEAL FLUID: Albumin, Fluid: 1.5 g/dL

## 2022-07-22 LAB — LACTATE DEHYDROGENASE: LDH: 150 U/L (ref 98–192)

## 2022-07-22 LAB — LACTATE DEHYDROGENASE, PLEURAL OR PERITONEAL FLUID: LD, Fluid: 54 U/L — ABNORMAL HIGH (ref 3–23)

## 2022-07-22 LAB — ALBUMIN: Albumin: 2.5 g/dL — ABNORMAL LOW (ref 3.5–5.0)

## 2022-07-22 LAB — PROTEIN, TOTAL: Total Protein: 6.4 g/dL — ABNORMAL LOW (ref 6.5–8.1)

## 2022-07-22 SURGERY — THORACENTESIS
Anesthesia: Topical | Laterality: Right

## 2022-07-22 NOTE — Interval H&P Note (Signed)
History and Physical Interval Note:  07/22/2022 2:49 PM  Maria [name]  has presented today for surgery, with the diagnosis of Right pleural effusion.  The various methods of treatment have been discussed with the patient and family. After consideration of risks, benefits and other options for treatment, the patient has consented to  Procedure(s): THORACENTESIS (Right) as a surgical intervention.  The patient's history has been reviewed, patient examined, no change in status, stable for surgery.  I have reviewed the patient's chart and labs.  Questions were answered to the patient's satisfaction.     [place]

## 2022-07-22 NOTE — Op Note (Signed)
Thoracentesis  Procedure Note  NOORAH GIAMMONA  [id number]  05-04-1924  Date:07/22/22  Time:3:39 PM   Provider Performing:Mikeyla Music L Bodee Lafoe   Procedure: Thoracentesis with imaging guidance (11552)  Indication(s) Pleural Effusion  Consent Risks of the procedure as well as the alternatives and risks of each were explained to the patient and/or caregiver.  Consent for the procedure was obtained and is signed in the bedside chart  Anesthesia Topical only with 1% lidocaine   Time Out Verified patient identification, verified procedure, site/side was marked, verified correct patient position, special equipment/implants available, medications/allergies/relevant history reviewed, required imaging and test results available.  Sterile Technique Maximal sterile technique including full sterile barrier drape, hand hygiene, sterile gown, sterile gloves, mask, hair covering, sterile ultrasound probe cover (if used).  Procedure Description Ultrasound was used to identify appropriate pleural anatomy for placement and overlying skin marked.  Area of drainage cleaned and draped in sterile fashion. Lidocaine was used to anesthetize the skin and subcutaneous tissue.  700 cc's of clear yellow appearing fluid was drained from the right pleural space. Catheter then removed and bandaid applied to site.  Complications/Tolerance None; patient tolerated the procedure well. Chest X-ray is ordered to confirm no post-procedural complication.  EBL Minimal  Specimen(s) Pleural fluid  Garner Nash, DO [facility] 07/22/2022 3:40 PM

## 2022-07-25 ENCOUNTER — Encounter (HOSPITAL_COMMUNITY): Payer: Self-pay | Admitting: Pulmonary Disease

## 2022-07-25 LAB — TRIGLYCERIDES, BODY FLUIDS: Triglycerides, Fluid: 25 mg/dL

## 2022-07-26 ENCOUNTER — Telehealth: Payer: Self-pay | Admitting: Pulmonary Disease

## 2022-07-26 DIAGNOSIS — S52502D Unspecified fracture of the lower end of left radius, subsequent encounter for closed fracture with routine healing: Secondary | ICD-10-CM | POA: Diagnosis not present

## 2022-07-26 DIAGNOSIS — S32592D Other specified fracture of left pubis, subsequent encounter for fracture with routine healing: Secondary | ICD-10-CM | POA: Diagnosis not present

## 2022-07-26 LAB — BODY FLUID CULTURE W GRAM STAIN: Culture: NO GROWTH

## 2022-07-26 LAB — CYTOLOGY - NON PAP

## 2022-07-26 NOTE — Telephone Encounter (Signed)
Left message to call back  

## 2022-07-27 NOTE — Telephone Encounter (Signed)
Dr. Valeta Harms, can you please advise on her thoracentesis results? Thanks.

## 2022-07-27 NOTE — Telephone Encounter (Signed)
[name] calling again. She is growing more distraught with each call. I have explained that the message is marked as urgent and that our nurses are with patients in office all day. She needs the results so that she can drive (over an hour) to her mom and explain results in person. Please advise.

## 2022-07-28 NOTE — Progress Notes (Signed)
Patty Sermons, please see phone note.  There is a phone note circulating within the triage pool.  Please let patient and daughter know there were no malignant cells in the pleural fluid which is good news.  The rest of the fluid analysis appears to be inflammatory.  The PET scan is very important next step.  Please ensure follow-up in the office after PET scan complete.  Thanks,  BLI  Garner Nash, DO [facility] 07/28/2022 7:51 AM

## 2022-07-28 NOTE — Telephone Encounter (Signed)
Called and spoke with pt's daughter [name] letting her know info per [facility] and she verbalized understanding. Nothing further needed.

## 2022-07-29 DIAGNOSIS — D509 Iron deficiency anemia, unspecified: Secondary | ICD-10-CM | POA: Diagnosis not present

## 2022-07-30 NOTE — Telephone Encounter (Signed)
ATC unable to leave voicemail because memory full

## 2022-08-02 DIAGNOSIS — R059 Cough, unspecified: Secondary | ICD-10-CM | POA: Diagnosis not present

## 2022-08-02 DIAGNOSIS — I517 Cardiomegaly: Secondary | ICD-10-CM | POA: Diagnosis not present

## 2022-08-05 ENCOUNTER — Telehealth: Payer: Self-pay | Admitting: Pulmonary Disease

## 2022-08-05 NOTE — Telephone Encounter (Signed)
Called and spoke with Maria [name] letting her know that we would send the message to Dr. Valeta Harms to let him know that they want a call as soon as the PET results are back instead of having to wait until pt's appt and she verbalized understanding.  Routing encounter to Dr. Valeta Harms.

## 2022-08-08 ENCOUNTER — Inpatient Hospital Stay (HOSPITAL_COMMUNITY)
Admission: EM | Admit: 2022-08-08 | Discharge: 2022-08-12 | DRG: 180 | Disposition: A | Discharge status: Skilled Nursing Facility | Payer: Medicare Other | Source: Skilled Nursing Facility | Attending: Internal Medicine | Admitting: Internal Medicine

## 2022-08-08 ENCOUNTER — Other Ambulatory Visit: Payer: Self-pay

## 2022-08-08 ENCOUNTER — Emergency Department (HOSPITAL_COMMUNITY): Payer: Medicare Other

## 2022-08-08 ENCOUNTER — Encounter (HOSPITAL_COMMUNITY): Payer: Self-pay | Admitting: Emergency Medicine

## 2022-08-08 DIAGNOSIS — C3431 Malignant neoplasm of lower lobe, right bronchus or lung: Secondary | ICD-10-CM | POA: Diagnosis not present

## 2022-08-08 DIAGNOSIS — J9811 Atelectasis: Secondary | ICD-10-CM | POA: Diagnosis not present

## 2022-08-08 DIAGNOSIS — I5031 Acute diastolic (congestive) heart failure: Secondary | ICD-10-CM | POA: Diagnosis not present

## 2022-08-08 DIAGNOSIS — Z681 Body mass index (BMI) 19 or less, adult: Secondary | ICD-10-CM

## 2022-08-08 DIAGNOSIS — R0602 Shortness of breath: Principal | ICD-10-CM

## 2022-08-08 DIAGNOSIS — E876 Hypokalemia: Secondary | ICD-10-CM | POA: Diagnosis present

## 2022-08-08 DIAGNOSIS — I130 Hypertensive heart and chronic kidney disease with heart failure and stage 1 through stage 4 chronic kidney disease, or unspecified chronic kidney disease: Secondary | ICD-10-CM | POA: Diagnosis present

## 2022-08-08 DIAGNOSIS — N1831 Chronic kidney disease, stage 3a: Secondary | ICD-10-CM | POA: Diagnosis present

## 2022-08-08 DIAGNOSIS — F32A Depression, unspecified: Secondary | ICD-10-CM | POA: Diagnosis present

## 2022-08-08 DIAGNOSIS — C787 Secondary malignant neoplasm of liver and intrahepatic bile duct: Secondary | ICD-10-CM | POA: Diagnosis present

## 2022-08-08 DIAGNOSIS — G2581 Restless legs syndrome: Secondary | ICD-10-CM | POA: Diagnosis present

## 2022-08-08 DIAGNOSIS — C182 Malignant neoplasm of ascending colon: Secondary | ICD-10-CM | POA: Diagnosis present

## 2022-08-08 DIAGNOSIS — S329XXD Fracture of unspecified parts of lumbosacral spine and pelvis, subsequent encounter for fracture with routine healing: Secondary | ICD-10-CM

## 2022-08-08 DIAGNOSIS — I5032 Chronic diastolic (congestive) heart failure: Secondary | ICD-10-CM | POA: Diagnosis present

## 2022-08-08 DIAGNOSIS — E039 Hypothyroidism, unspecified: Secondary | ICD-10-CM | POA: Diagnosis not present

## 2022-08-08 DIAGNOSIS — Z7901 Long term (current) use of anticoagulants: Secondary | ICD-10-CM

## 2022-08-08 DIAGNOSIS — J159 Unspecified bacterial pneumonia: Secondary | ICD-10-CM | POA: Diagnosis present

## 2022-08-08 DIAGNOSIS — R54 Age-related physical debility: Secondary | ICD-10-CM | POA: Diagnosis present

## 2022-08-08 DIAGNOSIS — Z7189 Other specified counseling: Secondary | ICD-10-CM | POA: Diagnosis not present

## 2022-08-08 DIAGNOSIS — Z823 Family history of stroke: Secondary | ICD-10-CM

## 2022-08-08 DIAGNOSIS — J189 Pneumonia, unspecified organism: Secondary | ICD-10-CM | POA: Diagnosis not present

## 2022-08-08 DIAGNOSIS — I480 Paroxysmal atrial fibrillation: Secondary | ICD-10-CM | POA: Diagnosis present

## 2022-08-08 DIAGNOSIS — E559 Vitamin D deficiency, unspecified: Secondary | ICD-10-CM | POA: Diagnosis not present

## 2022-08-08 DIAGNOSIS — Z66 Do not resuscitate: Secondary | ICD-10-CM | POA: Diagnosis present

## 2022-08-08 DIAGNOSIS — Z1152 Encounter for screening for COVID-19: Secondary | ICD-10-CM

## 2022-08-08 DIAGNOSIS — Z8249 Family history of ischemic heart disease and other diseases of the circulatory system: Secondary | ICD-10-CM | POA: Diagnosis not present

## 2022-08-08 DIAGNOSIS — J90 Pleural effusion, not elsewhere classified: Secondary | ICD-10-CM | POA: Diagnosis present

## 2022-08-08 DIAGNOSIS — D649 Anemia, unspecified: Secondary | ICD-10-CM | POA: Diagnosis present

## 2022-08-08 DIAGNOSIS — R918 Other nonspecific abnormal finding of lung field: Secondary | ICD-10-CM | POA: Diagnosis not present

## 2022-08-08 DIAGNOSIS — I272 Pulmonary hypertension, unspecified: Secondary | ICD-10-CM | POA: Diagnosis present

## 2022-08-08 DIAGNOSIS — R531 Weakness: Secondary | ICD-10-CM | POA: Diagnosis not present

## 2022-08-08 DIAGNOSIS — Z7401 Bed confinement status: Secondary | ICD-10-CM | POA: Diagnosis not present

## 2022-08-08 DIAGNOSIS — Z96641 Presence of right artificial hip joint: Secondary | ICD-10-CM | POA: Diagnosis present

## 2022-08-08 DIAGNOSIS — H9110 Presbycusis, unspecified ear: Secondary | ICD-10-CM | POA: Diagnosis present

## 2022-08-08 DIAGNOSIS — J9601 Acute respiratory failure with hypoxia: Secondary | ICD-10-CM | POA: Diagnosis present

## 2022-08-08 DIAGNOSIS — Z86718 Personal history of other venous thrombosis and embolism: Secondary | ICD-10-CM

## 2022-08-08 DIAGNOSIS — K900 Celiac disease: Secondary | ICD-10-CM | POA: Diagnosis present

## 2022-08-08 DIAGNOSIS — K6389 Other specified diseases of intestine: Secondary | ICD-10-CM | POA: Diagnosis not present

## 2022-08-08 DIAGNOSIS — R846 Abnormal cytological findings in specimens from respiratory organs and thorax: Secondary | ICD-10-CM | POA: Diagnosis not present

## 2022-08-08 DIAGNOSIS — I82409 Acute embolism and thrombosis of unspecified deep veins of unspecified lower extremity: Secondary | ICD-10-CM | POA: Diagnosis present

## 2022-08-08 DIAGNOSIS — I5033 Acute on chronic diastolic (congestive) heart failure: Secondary | ICD-10-CM

## 2022-08-08 DIAGNOSIS — E871 Hypo-osmolality and hyponatremia: Secondary | ICD-10-CM | POA: Diagnosis not present

## 2022-08-08 DIAGNOSIS — R64 Cachexia: Secondary | ICD-10-CM | POA: Diagnosis present

## 2022-08-08 DIAGNOSIS — Z8711 Personal history of peptic ulcer disease: Secondary | ICD-10-CM

## 2022-08-08 DIAGNOSIS — C189 Malignant neoplasm of colon, unspecified: Secondary | ICD-10-CM | POA: Diagnosis not present

## 2022-08-08 DIAGNOSIS — Z8673 Personal history of transient ischemic attack (TIA), and cerebral infarction without residual deficits: Secondary | ICD-10-CM

## 2022-08-08 DIAGNOSIS — Z951 Presence of aortocoronary bypass graft: Secondary | ICD-10-CM

## 2022-08-08 DIAGNOSIS — Z79890 Hormone replacement therapy (postmenopausal): Secondary | ICD-10-CM

## 2022-08-08 DIAGNOSIS — K219 Gastro-esophageal reflux disease without esophagitis: Secondary | ICD-10-CM | POA: Diagnosis present

## 2022-08-08 DIAGNOSIS — H3530 Unspecified macular degeneration: Secondary | ICD-10-CM | POA: Diagnosis present

## 2022-08-08 DIAGNOSIS — I2510 Atherosclerotic heart disease of native coronary artery without angina pectoris: Secondary | ICD-10-CM | POA: Diagnosis present

## 2022-08-08 DIAGNOSIS — R0902 Hypoxemia: Secondary | ICD-10-CM

## 2022-08-08 DIAGNOSIS — Z9851 Tubal ligation status: Secondary | ICD-10-CM

## 2022-08-08 DIAGNOSIS — S62102D Fracture of unspecified carpal bone, left wrist, subsequent encounter for fracture with routine healing: Secondary | ICD-10-CM

## 2022-08-08 DIAGNOSIS — Z79899 Other long term (current) drug therapy: Secondary | ICD-10-CM

## 2022-08-08 DIAGNOSIS — M810 Age-related osteoporosis without current pathological fracture: Secondary | ICD-10-CM | POA: Diagnosis present

## 2022-08-08 LAB — URINALYSIS, ROUTINE W REFLEX MICROSCOPIC
Bilirubin Urine: NEGATIVE
Glucose, UA: NEGATIVE mg/dL
Hgb urine dipstick: NEGATIVE
Ketones, ur: NEGATIVE mg/dL
Nitrite: NEGATIVE
Protein, ur: 30 mg/dL — AB
Specific Gravity, Urine: 1.015 (ref 1.005–1.030)
WBC, UA: >50 WBC/hpf — ABNORMAL HIGH (ref 0–5)
pH: 6 (ref 5.0–8.0)

## 2022-08-08 LAB — RENAL FUNCTION PANEL
Albumin: 2.5 g/dL — ABNORMAL LOW (ref 3.5–5.0)
Anion gap: 9 (ref 5–15)
BUN: 9 mg/dL (ref 8–23)
CO2: 23 mmol/L (ref 22–32)
Calcium: 7.9 mg/dL — ABNORMAL LOW (ref 8.9–10.3)
Chloride: 95 mmol/L — ABNORMAL LOW (ref 98–111)
Creatinine, Ser: 0.54 mg/dL (ref 0.44–1.00)
GFR, Estimated: >60 mL/min (ref >60)
Glucose, Bld: 98 mg/dL (ref 70–99)
Phosphorus: 3 mg/dL (ref 2.5–4.6)
Potassium: 3.3 mmol/L — ABNORMAL LOW (ref 3.5–5.1)
Sodium: 127 mmol/L — ABNORMAL LOW (ref 135–145)

## 2022-08-08 LAB — MAGNESIUM: Magnesium: 1.9 mg/dL (ref 1.7–2.4)

## 2022-08-08 LAB — BASIC METABOLIC PANEL
Anion gap: 6 (ref 5–15)
BUN: 9 mg/dL (ref 8–23)
CO2: 26 mmol/L (ref 22–32)
Calcium: 7.9 mg/dL — ABNORMAL LOW (ref 8.9–10.3)
Chloride: 95 mmol/L — ABNORMAL LOW (ref 98–111)
Creatinine, Ser: 0.42 mg/dL — ABNORMAL LOW (ref 0.44–1.00)
GFR, Estimated: >60 mL/min (ref >60)
Glucose, Bld: 118 mg/dL — ABNORMAL HIGH (ref 70–99)
Potassium: 2.9 mmol/L — ABNORMAL LOW (ref 3.5–5.1)
Sodium: 127 mmol/L — ABNORMAL LOW (ref 135–145)

## 2022-08-08 LAB — CBC WITH DIFFERENTIAL/PLATELET
Abs Immature Granulocytes: 0.06 10*3/uL (ref 0.00–0.07)
Basophils Absolute: 0 10*3/uL (ref 0.0–0.1)
Basophils Relative: 0 %
Eosinophils Absolute: 0 10*3/uL (ref 0.0–0.5)
Eosinophils Relative: 0 %
HCT: 31.3 % — ABNORMAL LOW (ref 36.0–46.0)
Hemoglobin: 10.6 g/dL — ABNORMAL LOW (ref 12.0–15.0)
Immature Granulocytes: 1 %
Lymphocytes Relative: 9 %
Lymphs Abs: 0.7 10*3/uL (ref 0.7–4.0)
MCH: 30.3 pg (ref 26.0–34.0)
MCHC: 33.9 g/dL (ref 30.0–36.0)
MCV: 89.4 fL (ref 80.0–100.0)
Monocytes Absolute: 0.3 10*3/uL (ref 0.1–1.0)
Monocytes Relative: 4 %
Neutro Abs: 7.2 10*3/uL (ref 1.7–7.7)
Neutrophils Relative %: 86 %
Platelets: 491 10*3/uL — ABNORMAL HIGH (ref 150–400)
RBC: 3.5 MIL/uL — ABNORMAL LOW (ref 3.87–5.11)
RDW: 15.6 % — ABNORMAL HIGH (ref 11.5–15.5)
WBC: 8.4 10*3/uL (ref 4.0–10.5)
nRBC: 0 % (ref 0.0–0.2)

## 2022-08-08 LAB — TROPONIN I (HIGH SENSITIVITY)
Troponin I (High Sensitivity): 11 ng/L (ref <18)
Troponin I (High Sensitivity): 9 ng/L (ref <18)

## 2022-08-08 LAB — SARS CORONAVIRUS 2 BY RT PCR: SARS Coronavirus 2 by RT PCR: NEGATIVE

## 2022-08-08 LAB — BRAIN NATRIURETIC PEPTIDE: B Natriuretic Peptide: 287 pg/mL — ABNORMAL HIGH (ref 0.0–100.0)

## 2022-08-08 MED ORDER — LEVOTHYROXINE SODIUM 25 MCG PO CAPS: 25.0000 ug | ORAL_CAPSULE | Freq: Every day | ORAL | Status: DC

## 2022-08-08 MED ORDER — SERTRALINE HCL 50 MG PO TABS
50.0000 mg | ORAL_TABLET | Freq: Every morning | ORAL | Status: DC
  Administered 2022-08-09 – 2022-08-12 (×4): 50 mg via ORAL
  Filled 2022-08-08 (×4): qty 1

## 2022-08-08 MED ORDER — ACETAMINOPHEN 325 MG PO TABS
650.0000 mg | ORAL_TABLET | Freq: Four times a day (QID) | ORAL | Status: DC | PRN
  Administered 2022-08-09 – 2022-08-10 (×2): 650 mg via ORAL
  Filled 2022-08-08 (×2): qty 2

## 2022-08-08 MED ORDER — GUAIFENESIN 100 MG/5ML PO LIQD
5.0000 mL | ORAL | Status: DC | PRN
  Administered 2022-08-08 – 2022-08-12 (×9): 5 mL via ORAL
  Filled 2022-08-08 (×9): qty 10

## 2022-08-08 MED ORDER — FERROUS SULFATE 325 (65 FE) MG PO TABS
325.0000 mg | ORAL_TABLET | Freq: Every day | ORAL | Status: DC
  Administered 2022-08-10 – 2022-08-12 (×3): 325 mg via ORAL
  Filled 2022-08-08 (×3): qty 1

## 2022-08-08 MED ORDER — ONDANSETRON HCL 4 MG/2ML IJ SOLN
4.0000 mg | Freq: Four times a day (QID) | INTRAMUSCULAR | Status: DC | PRN
  Administered 2022-08-09 – 2022-08-10 (×2): 4 mg via INTRAVENOUS
  Filled 2022-08-08 (×2): qty 2

## 2022-08-08 MED ORDER — FUROSEMIDE 10 MG/ML IJ SOLN
40.0000 mg | Freq: Once | INTRAMUSCULAR | Status: AC
  Administered 2022-08-08: 40 mg via INTRAVENOUS
  Filled 2022-08-08: qty 4

## 2022-08-08 MED ORDER — CHOLECALCIFEROL 10 MCG (400 UNIT) PO TABS
400.0000 [IU] | ORAL_TABLET | Freq: Every day | ORAL | Status: DC
  Administered 2022-08-09 – 2022-08-12 (×4): 400 [IU] via ORAL
  Filled 2022-08-08 (×4): qty 1

## 2022-08-08 MED ORDER — ONDANSETRON HCL 4 MG PO TABS: 4.0000 mg | ORAL_TABLET | Freq: Four times a day (QID) | ORAL | Status: DC | PRN

## 2022-08-08 MED ORDER — MELATONIN 5 MG PO TABS
5.0000 mg | ORAL_TABLET | Freq: Every day | ORAL | Status: DC
  Administered 2022-08-08 – 2022-08-11 (×4): 5 mg via ORAL
  Filled 2022-08-08 (×4): qty 1

## 2022-08-08 MED ORDER — AMIODARONE HCL 200 MG PO TABS
200.0000 mg | ORAL_TABLET | Freq: Every day | ORAL | Status: DC
  Administered 2022-08-08 – 2022-08-11 (×4): 200 mg via ORAL
  Filled 2022-08-08 (×4): qty 1

## 2022-08-08 MED ORDER — FUROSEMIDE 10 MG/ML IJ SOLN
40.0000 mg | Freq: Every day | INTRAMUSCULAR | Status: DC
  Administered 2022-08-09 – 2022-08-12 (×4): 40 mg via INTRAVENOUS
  Filled 2022-08-08 (×4): qty 4

## 2022-08-08 MED ORDER — IPRATROPIUM-ALBUTEROL 0.5-2.5 (3) MG/3ML IN SOLN
3.0000 mL | RESPIRATORY_TRACT | Status: DC
  Administered 2022-08-08: 3 mL via RESPIRATORY_TRACT
  Filled 2022-08-08: qty 3

## 2022-08-08 MED ORDER — IPRATROPIUM-ALBUTEROL 0.5-2.5 (3) MG/3ML IN SOLN: 3.0000 mL | RESPIRATORY_TRACT | Status: DC | PRN

## 2022-08-08 MED ORDER — POLYETHYL GLYCOL-PROPYL GLYCOL 0.4-0.3 % OP SOLN: 1.0000 [drp] | Freq: Every day | OPHTHALMIC | Status: DC

## 2022-08-08 MED ORDER — POTASSIUM CHLORIDE 10 MEQ/100ML IV SOLN
10.0000 meq | INTRAVENOUS | Status: AC
  Administered 2022-08-08 (×3): 10 meq via INTRAVENOUS
  Filled 2022-08-08 (×3): qty 100

## 2022-08-08 MED ORDER — DILTIAZEM HCL ER COATED BEADS 120 MG PO CP24
120.0000 mg | ORAL_CAPSULE | Freq: Every day | ORAL | Status: DC
  Administered 2022-08-08 – 2022-08-12 (×5): 120 mg via ORAL
  Filled 2022-08-08 (×5): qty 1

## 2022-08-08 MED ORDER — DOCUSATE SODIUM 100 MG PO CAPS
100.0000 mg | ORAL_CAPSULE | Freq: Every day | ORAL | Status: DC
  Administered 2022-08-08 – 2022-08-11 (×4): 100 mg via ORAL
  Filled 2022-08-08 (×4): qty 1

## 2022-08-08 MED ORDER — BACID PO TABS
1.0000 | ORAL_TABLET | Freq: Every day | ORAL | Status: DC
  Filled 2022-08-08: qty 1

## 2022-08-08 MED ORDER — ACETAMINOPHEN 650 MG RE SUPP: 650.0000 mg | Freq: Four times a day (QID) | RECTAL | Status: DC | PRN

## 2022-08-08 MED ORDER — APIXABAN 2.5 MG PO TABS: 2.5000 mg | ORAL_TABLET | Freq: Two times a day (BID) | ORAL | Status: DC

## 2022-08-08 MED ORDER — POLYVINYL ALCOHOL 1.4 % OP SOLN
1.0000 [drp] | Freq: Every day | OPHTHALMIC | Status: DC
  Administered 2022-08-09 – 2022-08-11 (×3): 1 [drp] via OPHTHALMIC
  Filled 2022-08-08 (×2): qty 15

## 2022-08-08 MED ORDER — BOOST PO LIQD
237.0000 mL | Freq: Three times a day (TID) | ORAL | Status: DC
  Filled 2022-08-08: qty 237

## 2022-08-08 MED ORDER — SODIUM CHLORIDE 1 G PO TABS
1.0000 g | ORAL_TABLET | Freq: Three times a day (TID) | ORAL | Status: DC
  Administered 2022-08-08 – 2022-08-12 (×12): 1 g via ORAL
  Filled 2022-08-08 (×11): qty 1

## 2022-08-08 MED ORDER — LEVOTHYROXINE SODIUM 25 MCG PO TABS
25.0000 ug | ORAL_TABLET | Freq: Every day | ORAL | Status: DC
  Administered 2022-08-09 – 2022-08-12 (×4): 25 ug via ORAL
  Filled 2022-08-08 (×4): qty 1

## 2022-08-08 MED ORDER — POLYETHYLENE GLYCOL 3350 17 G PO PACK
17.0000 g | PACK | Freq: Every day | ORAL | Status: DC
  Administered 2022-08-09 – 2022-08-12 (×4): 17 g via ORAL
  Filled 2022-08-08 (×4): qty 1

## 2022-08-08 MED ORDER — BOOST PLUS PO LIQD
237.0000 mL | Freq: Three times a day (TID) | ORAL | Status: DC
  Administered 2022-08-09 – 2022-08-12 (×10): 237 mL via ORAL
  Filled 2022-08-08 (×13): qty 237

## 2022-08-08 NOTE — ED Provider Notes (Addendum)
[facility] DEPT Provider Note: Georgena Spurling, MD, FACEP  CSN: [id number] MRN: [id number] ARRIVAL: 08/08/22 at [place]: [place] [facility]   HISTORY OF PRESENT ILLNESS  08/08/22 1:54 AM Maria [name] is a 88 y.o. female with a known lung mass.  She was recently diagnosed with pneumonia and started on doxycycline 08/04/2022.  She was sent from her nursing home this morning for increased shortness of breath and oxygen saturation in the mid 80s on room air.  She is not normally on supplemental oxygen.  She was placed on supplemental oxygen and brought to the ED.  She denies chest pain but states her chest feels tight making it difficult to breathe.   Past Medical History:  Diagnosis Date   Anemia    Atonic bladder 12/11/2013   Carotid artery occlusion    right carotid stenosis 40-60%, 4/08, neg for stenosis repeat study 11/11   Celiac disease    Chronic anticoagulation CHADS2VASC2 score 7 10/18/2014   Chronic diastolic CHF (congestive heart failure) (HCC)    takes Lasix daily   CKD (chronic kidney disease), stage III ([place])    Coronary artery disease    a.  08/2013 (LIMA-LAD, rSVG to LCX and Lt atrial clip),   Depression    DNR (do not resuscitate) 03/28/2021   DVT (deep venous thrombosis) ([place]) 09/2013   Gastric ulcer    6-74month ago   GERD (gastroesophageal reflux disease)    takes Omeprazole daily   GI bleeding    Hemorrhoids    Hot flashes    takes ZOloft 3 times a week   Hypertension    Hypothyroidism    takes Synthroid daily as result of AMiodarone   Insomnia    Macular degeneration    Mild aortic insufficiency    a. Echo 10/2013: mild AI.   Moderate mitral regurgitation by prior echocardiogram    a. Echo 10/2013.   Moderate obstructive sleep apnea    uses CPAP;sleep study about 4-525monthago   MVP (mitral valve prolapse)    Osteoporosis    PAF (paroxysmal atrial fibrillation) (HCC)    Peripheral edema    left    Presbycusis    Pulmonary hypertension (HCC)    RLS (restless legs syndrome)    Thyroid nodule    11m11mno change 11/11   Urethral stricture    Urinary anastomotic stricture    Urinary retention    Vitamin D deficiency     Past Surgical History:  Procedure Laterality Date   APPENDECTOMY     bilateral cataract surgery     CARDIAC CATHETERIZATION  08-06-13   CORONARY ARTERY BYPASS GRAFT N/A 08/28/2013   Procedure: CORONARY ARTERY BYPASS GRAFTING (CABG) x2 using right greater saphenous vein and left internal mammary artery. ;  Surgeon: EdwGrace IsaacD;  [facility];  Laterality: N/A;   HIP ARTHROPLASTY Right 12/09/2013   Procedure: ARTHROPLASTY BIPOLAR HIP CEMENTED;  Surgeon: FraKerin SalenD;  [facility];  Laterality: Right;   HIP PINNING,CANNULATED Left 03/30/2021   Procedure: CANNULATED HIP PINNING;  Surgeon: LanMarchia BondD;  [facility];  Laterality: Left;   INTRAOPERATIVE TRANSESOPHAGEAL ECHOCARDIOGRAM N/A 08/28/2013   Procedure: INTRAOPERATIVE TRANSESOPHAGEAL ECHOCARDIOGRAM;  Surgeon: EdwGrace IsaacD;  [facility];  Laterality: N/A;   LEFT AND RIGHT HEART CATHETERIZATION WITH CORONARY ANGIOGRAM N/A 08/16/2013  Procedure: LEFT AND RIGHT HEART CATHETERIZATION WITH CORONARY ANGIOGRAM;  Surgeon: [name], MD;  Location: [facility] CATH LAB;  Service: Cardiovascular;  Laterality: N/A;   MANDIBLE SURGERY     TCS     THORACENTESIS Right 07/22/2022   Procedure: THORACENTESIS;  Surgeon: Garner Nash, DO;  Location: [facility];  Service: Pulmonary;  Laterality: Right;   TONSILLECTOMY     trigger thumb     TUBAL LIGATION      Family History  Problem Relation Age of Onset   Heart attack Mother    CVA Mother    CAD Father    Colon cancer Father    Heart attack Father    Heart attack Brother    Peripheral vascular disease Brother    AAA  (abdominal aortic aneurysm) Brother     Social History   Tobacco Use   Smoking status: Never   Smokeless tobacco: Never  Vaping Use   Vaping Use: Never used  Substance Use Topics   Alcohol use: No   Drug use: No    Prior to Admission medications   Medication Sig Start Date End Date Taking? Authorizing Provider  acetaminophen (TYLENOL) 500 MG tablet Take 1 tablet (500 mg total) by mouth every 6 (six) hours as needed (pain). 06/17/22   Elodia Florence., MD  amiodarone (PACERONE) 200 MG tablet TAKE 1 TABLET BY MOUTH DAILY Patient taking differently: Take 200 mg by mouth at bedtime. 04/20/22   Belva Crome, MD  apixaban (ELIQUIS) 2.5 MG TABS tablet Take 1 tablet (2.5 mg total) by mouth 2 (two) times daily. 12/14/21 07/19/22  Belva Crome, MD  cholecalciferol (VITAMIN D3) 10 MCG (400 UNIT) TABS tablet Take 1 tablet (400 Units total) by mouth daily. 04/16/21   Love, Ivan Anchors, PA-C  diltiazem (CARDIZEM CD) 120 MG 24 hr capsule Take 1 capsule (120 mg total) by mouth daily. 12/14/21 07/19/22  Belva Crome, MD  furosemide (LASIX) 40 MG tablet Take 80 mg by mouth every other day, alternate with 40 mg by mouth every other day. 12/14/21   Belva Crome, MD  Levothyroxine Sodium 25 MCG CAPS Take 1 capsule by mouth daily before breakfast.    [provider]  magnesium hydroxide (MILK OF MAGNESIA) 400 MG/5ML suspension Take 15 mLs by mouth at bedtime as needed for mild constipation.    [provider]  melatonin 5 MG TABS Take 1-2 tablets (5-10 mg total) by mouth at bedtime. 04/15/21   Love, Ivan Anchors, PA-C  Menthol, Topical Analgesic, (ICY HOT EX) Apply 1 application topically daily as needed (pain).    [provider]  Nutritional Supplements (BOOST PO) Take by mouth.    [provider]  ondansetron (ZOFRAN) 4 MG tablet Take 4 mg by mouth every 8 (eight) hours as needed for nausea or vomiting.    [provider]  Polyethyl Glycol-Propyl Glycol (SYSTANE)  0.4-0.3 % SOLN Place 1 drop into both eyes at bedtime.    [provider]  polyethylene glycol (MIRALAX) 17 g packet Take 17 g by mouth daily. 06/17/22   Elodia Florence., MD  sertraline (ZOLOFT) 50 MG tablet Take 50 mg by mouth every morning. 03/12/21   [provider]    Allergies Fentanyl; Amoxicillin; Atorvastatin; Bupropion; Codeine; Fosamax [alendronate sodium]; Gabapentin; Gluten meal; Hydrochlorothiazide; and Tetanus toxoid, adsorbed   REVIEW OF SYSTEMS  Negative except as noted here or in the History of Present Illness.  PHYSICAL EXAMINATION  Initial Vital Signs Blood pressure (!) 153/69, pulse 93, temperature 98.2 F (36.8 C), temperature source Oral, resp. rate (!) 27, height 5' (1.524 m), weight 42.6 kg, SpO2 97 %.  Examination General: Well-developed, cachectic female in no acute distress; appearance consistent with age of record HENT: normocephalic; atraumatic Eyes: Normal appearance Neck: supple Heart: regular rhythm; frequent ectopy Lungs: Decreased sounds in bases; rattly cough; tachypnea Abdomen: soft; nondistended; nontender; bowel sounds present GU: Indwelling Foley draining clear yellow urine Extremities: No deformity; full range of motion; pulses normal Neurologic: Awake, alert; motor function intact in all extremities and symmetric; no facial droop; hard of hearing Skin: Warm and dry Psychiatric: Normal mood and affect   RESULTS  Summary of this visit's results, reviewed and interpreted by myself:   EKG Interpretation  Date/Time:  Sunday August 08 2022 01:47:37 EDT Ventricular Rate:  82 PR Interval:  58 QRS Duration: 152 QT Interval:  431 QTC Calculation: 504 R Axis:   -60 Text Interpretation: Indeterminate Rhythm Left atrial enlargement IVCD, consider atypical RBBB Inferior infarct, old Probable anterior infarct, age indeterminate Confirmed by Shanon Rosser [phone]) on 08/08/2022 1:54:06 AM       Laboratory  Studies: Results for orders placed or performed during the hospital encounter of 08/08/22 (from the past 24 hour(s))  CBC with Differential     Status: Abnormal   Collection Time: 08/08/22  2:00 AM  Result Value Ref Range   WBC 8.4 4.0 - 10.5 K/uL   RBC 3.50 (L) 3.87 - 5.11 MIL/uL   Hemoglobin 10.6 (L) 12.0 - 15.0 g/dL   HCT 31.3 (L) 36.0 - 46.0 %   MCV 89.4 80.0 - 100.0 fL   MCH 30.3 26.0 - 34.0 pg   MCHC 33.9 30.0 - 36.0 g/dL   RDW 15.6 (H) 11.5 - 15.5 %   Platelets 491 (H) 150 - 400 K/uL   nRBC 0.0 0.0 - 0.2 %   Neutrophils Relative % 86 %   Neutro Abs 7.2 1.7 - 7.7 K/uL   Lymphocytes Relative 9 %   Lymphs Abs 0.7 0.7 - 4.0 K/uL   Monocytes Relative 4 %   Monocytes Absolute 0.3 0.1 - 1.0 K/uL   Eosinophils Relative 0 %   Eosinophils Absolute 0.0 0.0 - 0.5 K/uL   Basophils Relative 0 %   Basophils Absolute 0.0 0.0 - 0.1 K/uL   Immature Granulocytes 1 %   Abs Immature Granulocytes 0.06 0.00 - 0.07 K/uL  Basic metabolic panel     Status: Abnormal   Collection Time: 08/08/22  2:00 AM  Result Value Ref Range   Sodium 127 (L) 135 - 145 mmol/L   Potassium 2.9 (L) 3.5 - 5.1 mmol/L   Chloride 95 (L) 98 - 111 mmol/L   CO2 26 22 - 32 mmol/L   Glucose, Bld 118 (H) 70 - 99 mg/dL   BUN 9 8 - 23 mg/dL   Creatinine, Ser 0.42 (L) 0.44 - 1.00 mg/dL   Calcium 7.9 (L) 8.9 - 10.3 mg/dL   GFR, Estimated >60 >60 mL/min   Anion gap 6 5 - 15  Blood culture (routine x 2)     Status: None (Preliminary result)   Collection Time: 08/08/22  2:00 AM   Specimen: Left Antecubital; Blood  Result Value Ref Range   Specimen Description      LEFT ANTECUBITAL BLOOD Performed at [facility], [address] [place], [place] [address]    Special Requests  BOTTLES DRAWN AEROBIC AND ANAEROBIC Blood Culture results may not be optimal due to an excessive volume of blood received in culture bottles Performed at [facility], [place] [address], [place], [place] [address]     Culture PENDING    Report Status PENDING   Brain natriuretic peptide     Status: Abnormal   Collection Time: 08/08/22  2:00 AM  Result Value Ref Range   B Natriuretic Peptide 287.0 (H) 0.0 - 100.0 pg/mL  Blood culture (routine x 2)     Status: None (Preliminary result)   Collection Time: 08/08/22  2:05 AM   Specimen: Right Antecubital; Blood  Result Value Ref Range   Specimen Description      RIGHT ANTECUBITAL BLOOD Performed at [facility], [place] [address] [place], [place] [address]    Special Requests      BOTTLES DRAWN AEROBIC AND ANAEROBIC Blood Culture results may not be optimal due to an excessive volume of blood received in culture bottles Performed at [facility] [address] [place], [place] [address]    Culture PENDING    Report Status PENDING    Imaging Studies: DG Chest Port 1 View  Result Date: 08/08/2022 CLINICAL DATA:  Shortness of breath EXAM: PORTABLE CHEST 1 VIEW COMPARISON:  07/22/2022 FINDINGS: Cardiac shadow is enlarged. Postsurgical changes are again seen and stable. Bilateral pleural effusions are seen right greater than left slightly larger than that seen on the prior study. No focal infiltrate is noted. No bony abnormality is seen. IMPRESSION: Bilateral effusions right greater than left increased from the prior study. Electronically Signed   By: Inez Catalina M.D.   On: 08/08/2022 02:00    ED COURSE and MDM  Nursing notes, initial and subsequent vitals signs, including pulse oximetry, reviewed and interpreted by myself.  Vitals:   08/08/22 0315 08/08/22 0330 08/08/22 0345 08/08/22 0400  BP: 121/64 (!) 141/73 132/68 137/77  Pulse: 71 (!) 119 89 71  Resp: (!) 23 (!) 24 (!) 23 (!) 29  Temp:      TempSrc:      SpO2: 97% 99% 98% 96%  Weight:      Height:       Medications  ipratropium-albuterol (DUONEB) 0.5-2.5 (3) MG/3ML nebulizer solution 3 mL (3 mLs Nebulization Given 08/08/22 0359)  potassium chloride 10 mEq in 100 mL  IVPB (10 mEq Intravenous New Bag/Given 08/08/22 0359)  furosemide (LASIX) injection 40 mg (40 mg Intravenous Given 08/08/22 0406)   3:40 AM Patient sleeping peacefully.  We will have patient admitted for worsening bilateral pleural effusions with associated hypoxia.  She is not febrile and does not have a leukocytosis so I am not sure if she in fact does have a pneumonia.  No focal infiltrate is seen on chest x-ray.  3:54 AM Dr. Marlowe Sax accepts for admission to the hospitalist service.  She would like the patient to receive 40 mg of Lasix given her elevated BNP.  PROCEDURES  Procedures   ED DIAGNOSES     ICD-10-CM   1. Shortness of breath  R06.02     2. Hypoxia  R09.02     3. Pleural effusion, bilateral  J90     4. Hypokalemia  E87.6     5. Hyponatremia  E87.1     6. Hypocalcemia  E83.51          Shanon Rosser, MD 08/08/22 0355    Shanon Rosser, MD 08/08/22 2232

## 2022-08-08 NOTE — H&P (Addendum)
History and Physical    Patient: Maria [name] BSW:[id number] DOB: 04-12-1924 DOA: 08/08/2022 DOS: the patient was seen and examined on 08/08/2022 PCP: [name], MD  Patient coming from: Home  Chief Complaint:  Chief Complaint  Patient presents with   Shortness of Breath   HPI: Maria [name] is a 88 y.o. female with medical history significant of PAF on eliquis, CVA, HTN, chronic diastolic HF. Presenting with shortness of breath. She reports that she's had about a week and a half of worsening cough, shortness of breath. She had a thoracentesis a couple of weeks ago for a CA w/u w/ pulmonology. She was initially ok, but a few days later she noticed increasing cough. She didn't have any sick contacts or fevers. She spoke with her PCP and was start on abx for presumed PNA. However, this morning it was noted at her SNF that her SpO2 was in the 80s and she was more short of breath. So they sent her to the ED for evaluation. She denies any other aggravating or alleviating factors.    Review of Systems: As mentioned in the history of present illness. All other systems reviewed and are negative. Past Medical History:  Diagnosis Date   Anemia    Atonic bladder 12/11/2013   Carotid artery occlusion    right carotid stenosis 40-60%, 4/08, neg for stenosis repeat study 11/11   Celiac disease    Chronic anticoagulation CHADS2VASC2 score 7 10/18/2014   Chronic diastolic CHF (congestive heart failure) (HCC)    takes Lasix daily   CKD (chronic kidney disease), stage III ([place])    Coronary artery disease    a.  08/2013 (LIMA-LAD, rSVG to LCX and Lt atrial clip),   Depression    DNR (do not resuscitate) 03/28/2021   DVT (deep venous thrombosis) ([facility]) 09/2013   Gastric ulcer    6-873month ago   GERD (gastroesophageal reflux disease)    takes Omeprazole daily   GI bleeding    Hemorrhoids    Hot flashes    takes ZOloft 3 times a week   Hypertension    Hypothyroidism    takes Synthroid  daily as result of AMiodarone   Insomnia    Macular degeneration    Mild aortic insufficiency    a. Echo 10/2013: mild AI.   Moderate mitral regurgitation by prior echocardiogram    a. Echo 10/2013.   Moderate obstructive sleep apnea    uses CPAP;sleep study about 4-5573monthago   MVP (mitral valve prolapse)    Osteoporosis    PAF (paroxysmal atrial fibrillation) (HCC)    Peripheral edema    left   Presbycusis    Pulmonary hypertension (HCC)    RLS (restless legs syndrome)    Thyroid nodule    [id number] change 11/11   Urethral stricture    Urinary anastomotic stricture    Urinary retention    Vitamin D deficiency    Past Surgical History:  Procedure Laterality Date   APPENDECTOMY     bilateral cataract surgery     CARDIAC CATHETERIZATION  08-06-13   CORONARY ARTERY BYPASS GRAFT N/A 08/28/2013   Procedure: CORONARY ARTERY BYPASS GRAFTING (CABG) x2 using right greater saphenous vein and left internal mammary artery. ;  Surgeon: EdwGrace IsaacD;  [facility];  Laterality: N/A;   HIP ARTHROPLASTY Right 12/09/2013   Procedure: ARTHROPLASTY BIPOLAR HIP CEMENTED;  Surgeon: FraKerin SalenD;  Location: [facility]  Service: Orthopedics;  Laterality: Right;   HIP PINNING,CANNULATED Left 03/30/2021   Procedure: CANNULATED HIP PINNING;  Surgeon: Marchia Bond, MD;  Location: [facility];  Service: Orthopedics;  Laterality: Left;   INTRAOPERATIVE TRANSESOPHAGEAL ECHOCARDIOGRAM N/A 08/28/2013   Procedure: INTRAOPERATIVE TRANSESOPHAGEAL ECHOCARDIOGRAM;  Surgeon: Grace Isaac, MD;  Location: [facility];  Service: Open Heart Surgery;  Laterality: N/A;   LEFT AND RIGHT HEART CATHETERIZATION WITH CORONARY ANGIOGRAM N/A 08/16/2013   Procedure: LEFT AND RIGHT HEART CATHETERIZATION WITH CORONARY ANGIOGRAM;  Surgeon: [name], MD;  Location: [facility] CATH LAB;  Service: Cardiovascular;  Laterality: N/A;   MANDIBLE SURGERY     TCS     THORACENTESIS Right 07/22/2022    Procedure: THORACENTESIS;  Surgeon: Garner Nash, DO;  Location: [facility];  Service: Pulmonary;  Laterality: Right;   TONSILLECTOMY     trigger thumb     TUBAL LIGATION     Social History:  reports that she has never smoked. She has never used smokeless tobacco. She reports that she does not drink alcohol and does not use drugs.  Allergies  Allergen Reactions   Fentanyl Nausea And Vomiting    Severe n/v   Amoxicillin Nausea Only and Other (See Comments)    dizziness   Atorvastatin Other (See Comments)    Unknown reaction - reported by Sadie [facility]er   Bupropion Other (See Comments)    nightmares   Codeine Nausea And Vomiting   Fosamax [Alendronate Sodium] Other (See Comments)    Jaw pain   Gabapentin Other (See Comments)    dizziness   Gluten Meal Other (See Comments)    Celiac Disease    Hydrochlorothiazide Other (See Comments)    Hyponatremia, GI upset    Tetanus Toxoid, Adsorbed Swelling    Severe swelling at site of injection (pt tolerates 1/2 injection then 1/2 week later)    Family History  Problem Relation Age of Onset   Heart attack Mother    CVA Mother    CAD Father    Colon cancer Father    Heart attack Father    Heart attack Brother    Peripheral vascular disease Brother    AAA (abdominal aortic aneurysm) Brother     Prior to Admission medications   Medication Sig Start Date End Date Taking? Authorizing Provider  acetaminophen (TYLENOL) 500 MG tablet Take 1 tablet (500 mg total) by mouth every 6 (six) hours as needed (pain). 06/17/22   Elodia Florence., MD  amiodarone (PACERONE) 200 MG tablet TAKE 1 TABLET BY MOUTH DAILY Patient taking differently: Take 200 mg by mouth at bedtime. 04/20/22   Belva Crome, MD  apixaban (ELIQUIS) 2.5 MG TABS tablet Take 1 tablet (2.5 mg total) by mouth 2 (two) times daily. 12/14/21 07/19/22  Belva Crome, MD  cholecalciferol (VITAMIN D3) 10 MCG (400 UNIT) TABS tablet Take 1 tablet (400 Units total) by mouth daily.  04/16/21   Love, Ivan Anchors, PA-C  diltiazem (CARDIZEM CD) 120 MG 24 hr capsule Take 1 capsule (120 mg total) by mouth daily. 12/14/21 07/19/22  Belva Crome, MD  furosemide (LASIX) 40 MG tablet Take 80 mg by mouth every other day, alternate with 40 mg by mouth every other day. 12/14/21   Belva Crome, MD  Levothyroxine Sodium 25 MCG CAPS Take 1 capsule by mouth daily before breakfast.    [provider]  magnesium hydroxide (MILK OF MAGNESIA) 400 MG/5ML suspension Take 15 mLs by mouth at bedtime  as needed for mild constipation.    [provider]  melatonin 5 MG TABS Take 1-2 tablets (5-10 mg total) by mouth at bedtime. 04/15/21   Love, Ivan Anchors, PA-C  Menthol, Topical Analgesic, (ICY HOT EX) Apply 1 application topically daily as needed (pain).    [provider]  Nutritional Supplements (BOOST PO) Take by mouth.    [provider]  ondansetron (ZOFRAN) 4 MG tablet Take 4 mg by mouth every 8 (eight) hours as needed for nausea or vomiting.    [provider]  Polyethyl Glycol-Propyl Glycol (SYSTANE) 0.4-0.3 % SOLN Place 1 drop into both eyes at bedtime.    [provider]  polyethylene glycol (MIRALAX) 17 g packet Take 17 g by mouth daily. 06/17/22   Elodia Florence., MD  sertraline (ZOLOFT) 50 MG tablet Take 50 mg by mouth every morning. 03/12/21   [provider]    Physical Exam: Vitals:   08/08/22 0515 08/08/22 0545 08/08/22 0600 08/08/22 0615  BP: (!) 122/53 (!) 115/56 131/65 106/65  Pulse: (!) 105 87 91 87  Resp: (!) 24 (!) 22 (!) 24 (!) 24  Temp:    98 F (36.7 C)  TempSrc:    Oral  SpO2: 99% 98% 97% 97%  Weight:      Height:       General: 88 y.o. female resting in bed in NAD Eyes: PERRL, normal sclera ENMT: Nares patent w/o discharge, orophaynx clear, dentition normal, ears w/o discharge/lesions/ulcers Neck: Supple, trachea midline Cardiovascular: RRR, +S1, S2, no m/g/r, equal pulses throughout Respiratory:  decreased at bases, no w/r/r, normal WOB GI: BS+, NDNT, no masses noted, no organomegaly noted MSK: No e/c/c Neuro: A&O x 3, no focal deficits Psyc: Appropriate interaction and affect, calm/cooperative  Data Reviewed:  Results for orders placed or performed during the hospital encounter of 08/08/22 (from the past 24 hour(s))  CBC with Differential     Status: Abnormal   Collection Time: 08/08/22  2:00 AM  Result Value Ref Range   WBC 8.4 4.0 - 10.5 K/uL   RBC 3.50 (L) 3.87 - 5.11 MIL/uL   Hemoglobin 10.6 (L) 12.0 - 15.0 g/dL   HCT 31.3 (L) 36.0 - 46.0 %   MCV 89.4 80.0 - 100.0 fL   MCH 30.3 26.0 - 34.0 pg   MCHC 33.9 30.0 - 36.0 g/dL   RDW 15.6 (H) 11.5 - 15.5 %   Platelets 491 (H) 150 - 400 K/uL   nRBC 0.0 0.0 - 0.2 %   Neutrophils Relative % 86 %   Neutro Abs 7.2 1.7 - 7.7 K/uL   Lymphocytes Relative 9 %   Lymphs Abs 0.7 0.7 - 4.0 K/uL   Monocytes Relative 4 %   Monocytes Absolute 0.3 0.1 - 1.0 K/uL   Eosinophils Relative 0 %   Eosinophils Absolute 0.0 0.0 - 0.5 K/uL   Basophils Relative 0 %   Basophils Absolute 0.0 0.0 - 0.1 K/uL   Immature Granulocytes 1 %   Abs Immature Granulocytes 0.06 0.00 - 0.07 K/uL  Basic metabolic panel     Status: Abnormal   Collection Time: 08/08/22  2:00 AM  Result Value Ref Range   Sodium 127 (L) 135 - 145 mmol/L   Potassium 2.9 (L) 3.5 - 5.1 mmol/L   Chloride 95 (L) 98 - 111 mmol/L   CO2 26 22 - 32 mmol/L   Glucose, Bld 118 (H) 70 - 99 mg/dL   BUN 9 8 -  23 mg/dL   Creatinine, Ser 0.42 (L) 0.44 - 1.00 mg/dL   Calcium 7.9 (L) 8.9 - 10.3 mg/dL   GFR, Estimated >60 >60 mL/min   Anion gap 6 5 - 15  Blood culture (routine x 2)     Status: None (Preliminary result)   Collection Time: 08/08/22  2:00 AM   Specimen: Left Antecubital; Blood  Result Value Ref Range   Specimen Description      LEFT ANTECUBITAL BLOOD Performed at [facility] [address], [place], [place] [address]    Special Requests      BOTTLES DRAWN AEROBIC AND  ANAEROBIC Blood Culture results may not be optimal due to an excessive volume of blood received in culture bottles Performed at [facility] [address] [place], [place] [address]    Culture PENDING    Report Status PENDING   Brain natriuretic peptide     Status: Abnormal   Collection Time: 08/08/22  2:00 AM  Result Value Ref Range   B Natriuretic Peptide 287.0 (H) 0.0 - 100.0 pg/mL  Blood culture (routine x 2)     Status: None (Preliminary result)   Collection Time: 08/08/22  2:05 AM   Specimen: Right Antecubital; Blood  Result Value Ref Range   Specimen Description      RIGHT ANTECUBITAL BLOOD Performed at [facility], [place] [address], [place], [place] [address]    Special Requests      BOTTLES DRAWN AEROBIC AND ANAEROBIC Blood Culture results may not be optimal due to an excessive volume of blood received in culture bottles Performed at [facility] [address] [place], [place] [address]    Culture PENDING    Report Status PENDING   Urinalysis, Routine w reflex microscopic Urine, Catheterized     Status: Abnormal   Collection Time: 08/08/22  2:11 AM  Result Value Ref Range   Color, Urine YELLOW YELLOW   APPearance HAZY (A) CLEAR   Specific Gravity, Urine 1.015 1.005 - 1.030   pH 6.0 5.0 - 8.0   Glucose, UA NEGATIVE NEGATIVE mg/dL   Hgb urine dipstick NEGATIVE NEGATIVE   Bilirubin Urine NEGATIVE NEGATIVE   Ketones, ur NEGATIVE NEGATIVE mg/dL   Protein, ur 30 (A) NEGATIVE mg/dL   Nitrite NEGATIVE NEGATIVE   Leukocytes,Ua LARGE (A) NEGATIVE   RBC / HPF 0-5 0 - 5 RBC/hpf   WBC, UA >50 (H) 0 - 5 WBC/hpf   Bacteria, UA RARE (A) NONE SEEN   Squamous Epithelial / LPF 0-5 0 - 5   WBC Clumps PRESENT    Mucus PRESENT    Ca Oxalate Crys, UA PRESENT    Non Squamous Epithelial 0-5 (A) NONE SEEN   Crystals PRESENT (A) NEGATIVE   CXR: Bilateral effusions right greater than left increased from the prior study.  Assessment and  Plan: Shortness of breath Acute on chronic diastolic HF     - admit to inpt, tele     - check echo     - daily wts, I&Os     - lasix     - trend trp     - CXR shows effusions (see below) but not PNA; checking CT chest to r/o PNA  Recurrent pleural effusion     - spoke with on-call pulm about her case (normally followed by Dr. Valeta Harms); rec'd starting with HF w/u as her pleural fluid studies from a couple of weeks ago was transudative; if not  improving w/ HF w/u, then consider full pulm consult      PAF on eliquis DVT     - continue home regimen when confirmed  Hypothyroidism     - continue home regimen when confirmed  HTN     - continue home regimen when confirmed  Depression     - continue home regimen when confirmed  Hypokalemia     - replace K+, check Mg2+  Normocytic anemia     - no evidence of bleed, check iron studies  Hyponatremia     - will be receiving lasix for HF; trend  Chronic indwelling foley     - continue outpt follow up  Advance Care Planning:   Code Status: DNR  Consults: Pulmnology  Family Communication: w/ daughter at bedside  Severity of Illness: The appropriate patient status for this patient is INPATIENT. Inpatient status is judged to be reasonable and necessary in order to provide the required intensity of service to ensure the patient's safety. The patient's presenting symptoms, physical exam findings, and initial radiographic and laboratory data in the context of their chronic comorbidities is felt to place them at high risk for further clinical deterioration. Furthermore, it is not anticipated that the patient will be medically stable for discharge from the hospital within 2 midnights of admission.   * I certify that at the point of admission it is my clinical judgment that the patient will require inpatient hospital care spanning beyond 2 midnights from the point of admission due to high intensity of service, high risk for further deterioration  and high frequency of surveillance required.*  Author: Jonnie Finner, DO 08/08/2022 7:21 AM  For on call review [email or web address].

## 2022-08-08 NOTE — ED Triage Notes (Signed)
  Patient BIB EMS for SOB.  Patient states she was admitted 2 weeks ago for pleural effusions and found out she had mass in her right lower lobe.  Patient has developed productive cough with tan sputum.  EMS reports that patient was having SOB at facility and required 2 L of O2.  Patient A&O on arrival.  Tachypneic and 91% on RA.  Placed on 2 L for comfort.  Afebrile.  States she has no pain but feels tightness around her chest.

## 2022-08-08 NOTE — TOC Progression Note (Signed)
Transition of Care [facility]) - Progression Note    Patient Details  Name: Maria [name] MRN: [id number] Date of Birth: 1924/04/07  [facility] CM/SW Contact  Henrietta Dine, RN Phone Number: 08/08/2022, 10:31 AM  Clinical Narrative:     TOC order received for Heart Failure Home Health Screening. Patient has been reviewed and does not meet criteria.         Expected Discharge Plan and Services                                                 Social Determinants of Health (SDOH) Interventions    Readmission Risk Interventions     No data to display

## 2022-08-09 ENCOUNTER — Inpatient Hospital Stay (HOSPITAL_COMMUNITY): Payer: Medicare Other

## 2022-08-09 DIAGNOSIS — R918 Other nonspecific abnormal finding of lung field: Secondary | ICD-10-CM | POA: Diagnosis not present

## 2022-08-09 DIAGNOSIS — I5033 Acute on chronic diastolic (congestive) heart failure: Secondary | ICD-10-CM | POA: Diagnosis not present

## 2022-08-09 DIAGNOSIS — I5031 Acute diastolic (congestive) heart failure: Secondary | ICD-10-CM

## 2022-08-09 DIAGNOSIS — E871 Hypo-osmolality and hyponatremia: Secondary | ICD-10-CM | POA: Diagnosis not present

## 2022-08-09 LAB — ECHOCARDIOGRAM COMPLETE
Area-P 1/2: 5.39 cm2
Height: 60 in
MV M vel: 4.76 m/s
MV Peak grad: 90.6 mmHg
P 1/2 time: 567 msec
Radius: 0.4 cm
S' Lateral: 2.8 cm
Weight: 1435.64 oz

## 2022-08-09 LAB — CBC
HCT: 31.1 % — ABNORMAL LOW (ref 36.0–46.0)
Hemoglobin: 10.4 g/dL — ABNORMAL LOW (ref 12.0–15.0)
MCH: 29.6 pg (ref 26.0–34.0)
MCHC: 33.4 g/dL (ref 30.0–36.0)
MCV: 88.6 fL (ref 80.0–100.0)
Platelets: 455 10*3/uL — ABNORMAL HIGH (ref 150–400)
RBC: 3.51 MIL/uL — ABNORMAL LOW (ref 3.87–5.11)
RDW: 15.9 % — ABNORMAL HIGH (ref 11.5–15.5)
WBC: 8 10*3/uL (ref 4.0–10.5)
nRBC: 0 % (ref 0.0–0.2)

## 2022-08-09 LAB — COMPREHENSIVE METABOLIC PANEL
ALT: 56 U/L — ABNORMAL HIGH (ref 0–44)
AST: 101 U/L — ABNORMAL HIGH (ref 15–41)
Albumin: 2.3 g/dL — ABNORMAL LOW (ref 3.5–5.0)
Alkaline Phosphatase: 98 U/L (ref 38–126)
Anion gap: 6 (ref 5–15)
BUN: 9 mg/dL (ref 8–23)
CO2: 27 mmol/L (ref 22–32)
Calcium: 8.1 mg/dL — ABNORMAL LOW (ref 8.9–10.3)
Chloride: 96 mmol/L — ABNORMAL LOW (ref 98–111)
Creatinine, Ser: 0.36 mg/dL — ABNORMAL LOW (ref 0.44–1.00)
GFR, Estimated: >60 mL/min (ref >60)
Glucose, Bld: 100 mg/dL — ABNORMAL HIGH (ref 70–99)
Potassium: 3.4 mmol/L — ABNORMAL LOW (ref 3.5–5.1)
Sodium: 129 mmol/L — ABNORMAL LOW (ref 135–145)
Total Bilirubin: 0.5 mg/dL (ref 0.3–1.2)
Total Protein: 6 g/dL — ABNORMAL LOW (ref 6.5–8.1)

## 2022-08-09 MED ORDER — TRAMADOL HCL 50 MG PO TABS: 50.0000 mg | ORAL_TABLET | Freq: Four times a day (QID) | ORAL | Status: DC | PRN

## 2022-08-09 MED ORDER — CHLORHEXIDINE GLUCONATE CLOTH 2 % EX PADS
6.0000 | MEDICATED_PAD | Freq: Every day | CUTANEOUS | Status: DC
  Administered 2022-08-09 – 2022-08-12 (×4): 6 via TOPICAL

## 2022-08-09 MED ORDER — RISAQUAD PO CAPS
1.0000 | ORAL_CAPSULE | Freq: Every day | ORAL | Status: DC
  Administered 2022-08-09 – 2022-08-12 (×4): 1 via ORAL
  Filled 2022-08-09 (×4): qty 1

## 2022-08-09 MED ORDER — DOXYCYCLINE HYCLATE 100 MG PO TABS
100.0000 mg | ORAL_TABLET | Freq: Two times a day (BID) | ORAL | Status: AC
  Administered 2022-08-09 – 2022-08-11 (×6): 100 mg via ORAL
  Filled 2022-08-09 (×6): qty 1

## 2022-08-09 MED ORDER — TRAMADOL HCL 50 MG PO TABS
50.0000 mg | ORAL_TABLET | Freq: Two times a day (BID) | ORAL | Status: DC | PRN
  Administered 2022-08-10: 50 mg via ORAL
  Filled 2022-08-09: qty 1

## 2022-08-09 MED ORDER — SODIUM CHLORIDE 0.9 % IV SOLN
2.0000 g | INTRAVENOUS | Status: DC
  Administered 2022-08-09 – 2022-08-12 (×4): 2 g via INTRAVENOUS
  Filled 2022-08-09 (×4): qty 20

## 2022-08-09 NOTE — Progress Notes (Signed)
PROGRESS NOTE    Maria [name]  YBO:[id number] DOB: [birth year] DOA: 08/08/2022 PCP: [name], MD    Brief Narrative:  88 year old with history of paroxysmal A-fib on Eliquis, history of stroke, hypertension, chronic diastolic heart failure presented to the emergency room with shortness of breath and hypoxemia from a skilled nursing facility.  Patient was admitted to the hospital 2 months ago with traumatic pelvic fracture and left wrist fracture and discharged to a SNF.  Since then she had worsening shortness of breath and was diagnosed with pleural effusion, right lower lobe tumor and is scheduled for PET scan on 10/18.  Pleural fluid was negative for malignancy.  Brought to the ER with persistent symptoms.  She is coughing and nauseated with cough.  COVID-19 negative.  She was taking antibiotics before coming to the ER.   Assessment & Plan:   Acute hypoxemic respiratory failure, presentation oxygen 83% on room air and shortness of breath corrected with supplemental oxygen. Bilateral pleural effusion, transudate on recent thoracentesis, recurrent right more than left. Right lower lobe tumor, increasing and cavitating on repeat CT scan today.  Patient has severe symptoms with shortness of breath cough.   Patient has groundglass opacity with COVID-19 test was negative.  Cough and productive sputum.  Blood cultures were drawn.  We will do sputum cultures and start patient on broad-spectrum antibiotics. Chest physiotherapy, incentive spirometry, deep breathing exercises, sputum induction, mucolytic's and bronchodilators.  Mobilize with PT OT. Sputum cultures, blood cultures. Supplemental oxygen to keep saturations more than 90%. Repeat thoracentesis today, cultures and cytology. Depends upon thoracentesis findings, will discuss with pulmonary.  The cavitating mass is likely malignant tumor.  Paroxysmal A-fib, currently sinus rhythm on amiodarone.  Also on Cardizem.  She is on  Eliquis.  Resume after procedure.  Chronic diastolic congestive heart failure, unlikely contributing.  Continue Lasix.  Debility with recent pelvic fracture and left wrist fracture, work with PT OT.  May still need more rehab.  Hypothyroidism, on Synthroid.  Resume.  Hyponatremia, chronic and persistent.  Chronic indwelling Foley catheter secondary to immobility and recent pelvic fracture, failed voiding trial.  Continue catheter care until improvement of mobility.      DVT prophylaxis:   Eliquis, on hold.   Code Status: DNR/DNI Family Communication: Daughter and granddaughter at the bedside Disposition Plan: Status is: Inpatient Remains inpatient appropriate because: Significant shortness of breath, pneumonia, new diagnosis of lung tumor     Consultants:  None  Procedures:  Thoracentesis, ordered  Antimicrobials:  Rocephin and doxycycline 10/16----   Subjective: Patient seen and examined.  She had ongoing cough and some chest discomfort.  She feels like she has a band around her lower chest.  Difficult to take deep breaths and feels heaviness.  Coughing plaintive with mucoid sputum.  Family at the bedside.  Objective: Vitals:   08/09/22 0517 08/09/22 0649 08/09/22 0900 08/09/22 1043  BP: 137/73   132/83  Pulse: 91   (!) 102  Resp: 16  18 (!) 25  Temp: 98.1 F (36.7 C)     TempSrc: Oral     SpO2: 95%   97%  Weight:  40.7 kg    Height:        Intake/Output Summary (Last 24 hours) at 08/09/2022 1201 Last data filed at 08/08/2022 1400 Gross per 24 hour  Intake --  Output 250 ml  Net -250 ml   Filed Weights   08/08/22 0144 08/09/22 0649  Weight: 42.6 kg 40.7 kg  Examination:  General exam: Appears appropriately anxious.  In mild distress with ongoing cough. Respiratory system: Poor bilateral air entry.  She has mostly conducted upper airway sounds.  She does have expiratory and inspiratory crackles mostly from upper airway. Cardiovascular system: S1  & S2 heard, RRR. No JVD, murmurs, rubs, gallops or clicks. No pedal edema. Gastrointestinal system: Abdomen is nondistended, soft and nontender. No organomegaly or masses felt. Normal bowel sounds heard. Central nervous system: Alert and oriented. No focal neurological deficits. Frail and debilitated.    Data Reviewed: I have personally reviewed following labs and imaging studies  CBC: Recent Labs  Lab 08/08/22 0200 08/09/22 0318  WBC 8.4 8.0  NEUTROABS 7.2  --   HGB 10.6* 10.4*  HCT 31.3* 31.1*  MCV 89.4 88.6  PLT 491* 037*   Basic Metabolic Panel: Recent Labs  Lab 08/08/22 0200 08/08/22 0953 08/09/22 0318  NA 127* 127* 129*  K 2.9* 3.3* 3.4*  CL 95* 95* 96*  CO2 26 23 27   GLUCOSE 118* 98 100*  BUN 9 9 9   CREATININE 0.42* 0.54 0.36*  CALCIUM 7.9* 7.9* 8.1*  MG  --  1.9  --   PHOS  --  3.0  --    GFR: Estimated Creatinine Clearance: 26.4 mL/min (A) (by C-G formula based on SCr of 0.36 mg/dL (L)). Liver Function Tests: Recent Labs  Lab 08/08/22 0953 08/09/22 0318  AST  --  101*  ALT  --  56*  ALKPHOS  --  98  BILITOT  --  0.5  PROT  --  6.0*  ALBUMIN 2.5* 2.3*   No results for input(s): "LIPASE", "AMYLASE" in the last 168 hours. No results for input(s): "AMMONIA" in the last 168 hours. Coagulation Profile: No results for input(s): "INR", "PROTIME" in the last 168 hours. Cardiac Enzymes: No results for input(s): "CKTOTAL", "CKMB", "CKMBINDEX", "TROPONINI" in the last 168 hours. BNP (last 3 results) Recent Labs    10/02/21 1529  PROBNP 1,013*   HbA1C: No results for input(s): "HGBA1C" in the last 72 hours. CBG: No results for input(s): "GLUCAP" in the last 168 hours. Lipid Profile: No results for input(s): "CHOL", "HDL", "LDLCALC", "TRIG", "CHOLHDL", "LDLDIRECT" in the last 72 hours. Thyroid Function Tests: No results for input(s): "TSH", "T4TOTAL", "FREET4", "T3FREE", "THYROIDAB" in the last 72 hours. Anemia Panel: No results for input(s):  "VITAMINB12", "FOLATE", "FERRITIN", "TIBC", "IRON", "RETICCTPCT" in the last 72 hours. Sepsis Labs: No results for input(s): "PROCALCITON", "LATICACIDVEN" in the last 168 hours.  Recent Results (from the past 240 hour(s))  Blood culture (routine x 2)     Status: None (Preliminary result)   Collection Time: 08/08/22  2:00 AM   Specimen: Left Antecubital; Blood  Result Value Ref Range Status   Specimen Description   Final    LEFT ANTECUBITAL BLOOD Performed at [facility], [address], [place], [place] [address]    Special Requests   Final    BOTTLES DRAWN AEROBIC AND ANAEROBIC Blood Culture results may not be optimal due to an excessive volume of blood received in culture bottles Performed at [place] [address] [place], [place] [address]    Culture   Final    NO GROWTH 1 DAY Performed at [facility], [place] [address], [place],  [address]    Report Status PENDING  Incomplete  Blood culture (routine x 2)     Status: None (Preliminary result)   Collection Time: 08/08/22  2:05  AM   Specimen: Right Antecubital; Blood  Result Value Ref Range Status   Specimen Description   Final    RIGHT ANTECUBITAL BLOOD Performed at [place] [address], [place], [place] [address]    Special Requests   Final    BOTTLES DRAWN AEROBIC AND ANAEROBIC Blood Culture results may not be optimal due to an excessive volume of blood received in culture bottles Performed at [place] [address], [place], [place] [address]    Culture   Final    NO GROWTH 1 DAY Performed at [facility], [place] [address], [place], [place] [address]    Report Status PENDING  Incomplete  SARS Coronavirus 2 by RT PCR (hospital order, performed in [facility] hospital lab) *cepheid single result test* Anterior Nasal Swab     Status: None   Collection Time: 08/08/22 11:29 AM   Specimen: Anterior Nasal Swab  Result Value Ref Range Status    SARS Coronavirus 2 by RT PCR NEGATIVE NEGATIVE Final    Comment: (NOTE) SARS-CoV-2 target nucleic acids are NOT DETECTED.  The SARS-CoV-2 RNA is generally detectable in upper and lower respiratory specimens during the acute phase of infection. The lowest concentration of SARS-CoV-2 viral copies this assay can detect is 250 copies / mL. A negative result does not preclude SARS-CoV-2 infection and should not be used as the sole basis for treatment or other patient management decisions.  A negative result may occur with improper specimen collection / handling, submission of specimen other than nasopharyngeal swab, presence of viral mutation(s) within the areas targeted by this assay, and inadequate number of viral copies (<250 copies / mL). A negative result must be combined with clinical observations, patient history, and epidemiological information.  Fact Sheet for Patients:   https://www.patel.info/  Fact Sheet for Healthcare Providers: https://hall.com/  This test is not yet approved or  cleared by the [place] FDA and has been authorized for detection and/or diagnosis of SARS-CoV-2 by FDA under an Emergency Use Authorization (EUA).  This EUA will remain in effect (meaning this test can be used) for the duration of the COVID-19 declaration under Section 564(b)(1) of the Act, 21 U.S.C. section 360bbb-3(b)(1), unless the authorization is terminated or revoked sooner.  Performed at [facility], [place] [address], [place], [place] [address]          Radiology Studies: CT CHEST WO CONTRAST  Result Date: 08/09/2022 CLINICAL DATA:  Shortness of breath EXAM: CT CHEST WITHOUT CONTRAST TECHNIQUE: Multidetector CT imaging of the chest was performed following the standard protocol without IV contrast. RADIATION DOSE REDUCTION: This exam was performed according to the departmental dose-optimization program which  includes automated exposure control, adjustment of the mA and/or kV according to patient size and/or use of iterative reconstruction technique. COMPARISON:  Previous CT done on 07/12/2022 and chest radiograph done on 08/08/2022 FINDINGS: Cardiovascular: Extensive coronary artery calcifications are seen. There is evidence of previous coronary bypass surgery. Heart is enlarged in size. There is a metallic clamp in the region of left atrial appendage. Scattered calcifications are seen in thoracic aorta. There is ectasia of ascending thoracic aorta measuring 3.9 cm. Mediastinum/Nodes: No new significant lymphadenopathy seen. Lungs/Pleura: Moderate to large bilateral pleural effusions are seen, more so on the right side with interval increase. There is interval appearance of multiple patchy ground-glass infiltrates of varying sizes in both upper and both mid lung fields. There is compression atelectasis in the lower  lung fields, more so on the right side. There is 5.3 x 3.7 cm area of low-density with pocket of air in right lower lobe. Possibility of malignant neoplasm with necrosis should be considered. There is interval increase in size. There is no pneumothorax. Upper Abdomen: Arterial calcifications are seen in abdominal aorta. Musculoskeletal: There is decrease in height of multiple thoracic vertebral bodies with no significant interval change. IMPRESSION: Moderate to large bilateral pleural effusions, more so on the right side with interval increase. There is interval appearance of multiple patchy ground-glass infiltrates of varying sizes in both lungs suggesting multifocal pneumonia. Please correlate for possible COVID pneumonia. There is compression atelectasis in both lower lung fields, more so on the right side. There is 5.3 x 3.7 cm area of low-density with small area of cavitation in right lower lobe suggesting possible malignant neoplasm with interval increase in size. Cardiomegaly.  Coronary artery disease.  Other findings as described in the body of the report. Electronically Signed   By: Elmer Picker M.D.   On: 08/09/2022 10:13   DG Chest Port 1 View  Result Date: 08/08/2022 CLINICAL DATA:  Shortness of breath EXAM: PORTABLE CHEST 1 VIEW COMPARISON:  07/22/2022 FINDINGS: Cardiac shadow is enlarged. Postsurgical changes are again seen and stable. Bilateral pleural effusions are seen right greater than left slightly larger than that seen on the prior study. No focal infiltrate is noted. No bony abnormality is seen. IMPRESSION: Bilateral effusions right greater than left increased from the prior study. Electronically Signed   By: Inez Catalina M.D.   On: 08/08/2022 02:00        Scheduled Meds:  acidophilus  1 capsule Oral Daily   amiodarone  200 mg Oral QHS   Chlorhexidine Gluconate Cloth  6 each Topical Daily   cholecalciferol  400 Units Oral Daily   diltiazem  120 mg Oral Daily   docusate sodium  100 mg Oral QHS   doxycycline  100 mg Oral Q12H   ferrous sulfate  325 mg Oral Q breakfast   furosemide  40 mg Intravenous Daily   lactose free nutrition  237 mL Oral TID WC   levothyroxine  25 mcg Oral Q0600   melatonin  5 mg Oral QHS   polyethylene glycol  17 g Oral Daily   polyvinyl alcohol  1 drop Both Eyes QHS   sertraline  50 mg Oral q morning   sodium chloride  1 g Oral TID   Continuous Infusions:  cefTRIAXone (ROCEPHIN)  IV       LOS: 1 day    Time spent: 40 minutes    Barb Merino, MD [facility] Pager [phone]

## 2022-08-09 NOTE — Consult Note (Signed)
WOC Nurse Consult Note: Reason for Consult: Stage 1 pressure injury Wound type: Stage 1 pressure injury sacrum/spine Pressure Injury POA: Yes Measurement: see nursing flow sheets Wound bed: intact; non blanchable skin  Drainage (amount, consistency, odor) none Periwound: intact  Dressing procedure/placement/frequency: Silicone foam per the nursing skin care order set, change every 3 days and PRN soilage   Discussed POC with patient and bedside nurse.  Re consult if needed, will not follow at this time. Thanks  Pennye Beeghly [employer], RN,CWOCN, CNS, [place] [phone])

## 2022-08-09 NOTE — Progress Notes (Signed)
  Echocardiogram 2D Echocardiogram has been performed.  Darlina Sicilian M 08/09/2022, 3:13 PM

## 2022-08-10 ENCOUNTER — Inpatient Hospital Stay (HOSPITAL_COMMUNITY): Payer: Medicare Other

## 2022-08-10 DIAGNOSIS — I5033 Acute on chronic diastolic (congestive) heart failure: Secondary | ICD-10-CM | POA: Diagnosis not present

## 2022-08-10 DIAGNOSIS — E871 Hypo-osmolality and hyponatremia: Secondary | ICD-10-CM | POA: Diagnosis not present

## 2022-08-10 DIAGNOSIS — R918 Other nonspecific abnormal finding of lung field: Secondary | ICD-10-CM | POA: Diagnosis not present

## 2022-08-10 LAB — GLUCOSE, PLEURAL OR PERITONEAL FLUID: Glucose, Fluid: 117 mg/dL

## 2022-08-10 LAB — BODY FLUID CELL COUNT WITH DIFFERENTIAL
Lymphs, Fluid: 33 %
Monocyte-Macrophage-Serous Fluid: 42 % — ABNORMAL LOW (ref 50–90)
Neutrophil Count, Fluid: 25 % (ref 0–25)
Total Nucleated Cell Count, Fluid: 311 cu mm (ref 0–1000)

## 2022-08-10 MED ORDER — APIXABAN 2.5 MG PO TABS
2.5000 mg | ORAL_TABLET | Freq: Two times a day (BID) | ORAL | Status: DC
  Administered 2022-08-10 – 2022-08-12 (×5): 2.5 mg via ORAL
  Filled 2022-08-10 (×5): qty 1

## 2022-08-10 MED ORDER — LIDOCAINE HCL 1 % IJ SOLN
INTRAMUSCULAR | Status: AC
  Administered 2022-08-10: 10 mL
  Filled 2022-08-10: qty 20

## 2022-08-10 NOTE — Progress Notes (Signed)
PROGRESS NOTE    Maria [name]  SNK:[id number] DOB: 01-Jan-1924 DOA: 08/08/2022 PCP: [name], MD    Brief Narrative:  88 year old with history of paroxysmal A-fib on Eliquis, history of stroke, hypertension, chronic diastolic heart failure presented to the emergency room with shortness of breath and hypoxemia from a skilled nursing facility.  Patient was admitted to the hospital 2 months ago with traumatic pelvic fracture and left wrist fracture and discharged to a SNF.  Since then she had worsening shortness of breath and was diagnosed with pleural effusion, right lower lobe tumor and is scheduled for PET scan on 10/18.  Pleural fluid was negative for malignancy.  Brought to the ER with persistent symptoms.  She is coughing and nauseated with cough.  COVID-19 negative.  She was taking antibiotics before coming to the ER.   Assessment & Plan:   Acute hypoxemic respiratory failure, presentation oxygen 83% on room air and shortness of breath corrected with supplemental oxygen. Bilateral pleural effusion, transudate on recent thoracentesis, recurrent right more than left. Right lower lobe tumor, increasing and cavitating on repeat CT scan.  Patient has groundglass opacity with COVID-19 test was negative.  Cough and productive sputum.  Blood cultures were drawn.  We will do sputum cultures, continue rocephin and doxycycline.  Chest physiotherapy, incentive spirometry, deep breathing exercises, sputum induction, mucolytic's and bronchodilators.  Mobilize with PT OT. Supplemental oxygen to keep saturations more than 90%. Repeat thoracentesis today, 750 mL drained.  Cultures and cytology repeated. The cavitating mass is likely malignant tumor.  Radiology was able to schedule PET scan when patient is in the hospital.  Will order.  Paroxysmal A-fib, currently sinus rhythm on amiodarone and Cardizem.  Resume Eliquis.  Chronic diastolic congestive heart failure, unlikely contributing.   Continue Lasix IV today for symptom relief.  Debility with recent pelvic fracture and left wrist fracture, work with PT OT.  May still need more rehab.  Hypothyroidism, on Synthroid.  Resumed.  Hyponatremia, chronic and persistent.  Chronic indwelling Foley catheter secondary to immobility and recent pelvic fracture, failed voiding trial.  Continue catheter care until improvement of mobility.  Goals of care: Multiple discussions with patient, multiple family members and patient's daughters. Patient is well aware about her diagnosis, she wants to know what is the cavitating lesion is. Patient is not ready to be discharged from the hospital, will see if PET scan can be done inpatient. Liberalize diet to regular diet and take of restrictions. Depends upon PET diagnosis, if it proves to be malignant tumor, appropriate for hospice.    DVT prophylaxis: apixaban (ELIQUIS) tablet 2.5 mg Start: 08/10/22 1400  Eliquis, on hold. apixaban (ELIQUIS) tablet 2.5 mg   Code Status: DNR/DNI Family Communication: Daughters and granddaughter at the bedside Disposition Plan: Status is: Inpatient Remains inpatient appropriate because: Significant shortness of breath, pneumonia, new diagnosis of lung tumor     Consultants:  PCCM, curbside  Procedures:  Thoracentesis, 750 mL right side  Antimicrobials:  Rocephin and doxycycline 10/16----   Subjective:  Patient seen and examined.  Still has cough.  Examined after thoracentesis and she felt much better.  She wants to know if it is cancer.  Patient tells me she did not like the food here because there were restrictions. Not mobilized yet.  Objective: Vitals:   08/10/22 1000 08/10/22 1016 08/10/22 1052 08/10/22 1136  BP: (!) 98/56 96/78 (!) 94/57 (!) 95/55  Pulse:   72 82  Resp: (!) 30 (!) 24 18  Temp:   98.5 F (36.9 C) 97.8 F (36.6 C)  TempSrc:   Oral Oral  SpO2:   95% 97%  Weight:      Height:        Intake/Output Summary (Last 24  hours) at 08/10/2022 1251 Last data filed at 08/10/2022 0800 Gross per 24 hour  Intake 340 ml  Output 1201 ml  Net -861 ml   Filed Weights   08/08/22 0144 08/09/22 0649  Weight: 42.6 kg 40.7 kg    Examination:  General exam: Frail and debilitated, not in any distress except when coughing.  Respiratory system: Poor bilateral air entry.  She has mostly conducted upper airway sounds.   Cardiovascular system: S1 & S2 heard, RRR. No JVD, murmurs, rubs, gallops or clicks. No pedal edema.  Bevelyn Buckles' sign negative. Gastrointestinal system: Abdomen is nondistended, soft and nontender. No organomegaly or masses felt. Normal bowel sounds heard. Central nervous system: Alert and oriented. No focal neurological deficits. Frail and debilitated.    Data Reviewed: I have personally reviewed following labs and imaging studies  CBC: Recent Labs  Lab 08/08/22 0200 08/09/22 0318  WBC 8.4 8.0  NEUTROABS 7.2  --   HGB 10.6* 10.4*  HCT 31.3* 31.1*  MCV 89.4 88.6  PLT 491* 130*   Basic Metabolic Panel: Recent Labs  Lab 08/08/22 0200 08/08/22 0953 08/09/22 0318  NA 127* 127* 129*  K 2.9* 3.3* 3.4*  CL 95* 95* 96*  CO2 26 23 27   GLUCOSE 118* 98 100*  BUN 9 9 9   CREATININE 0.42* 0.54 0.36*  CALCIUM 7.9* 7.9* 8.1*  MG  --  1.9  --   PHOS  --  3.0  --    GFR: Estimated Creatinine Clearance: 26.4 mL/min (A) (by C-G formula based on SCr of 0.36 mg/dL (L)). Liver Function Tests: Recent Labs  Lab 08/08/22 0953 08/09/22 0318  AST  --  101*  ALT  --  56*  ALKPHOS  --  98  BILITOT  --  0.5  PROT  --  6.0*  ALBUMIN 2.5* 2.3*   No results for input(s): "LIPASE", "AMYLASE" in the last 168 hours. No results for input(s): "AMMONIA" in the last 168 hours. Coagulation Profile: No results for input(s): "INR", "PROTIME" in the last 168 hours. Cardiac Enzymes: No results for input(s): "CKTOTAL", "CKMB", "CKMBINDEX", "TROPONINI" in the last 168 hours. BNP (last 3 results) Recent Labs     10/02/21 1529  PROBNP 1,013*   HbA1C: No results for input(s): "HGBA1C" in the last 72 hours. CBG: No results for input(s): "GLUCAP" in the last 168 hours. Lipid Profile: No results for input(s): "CHOL", "HDL", "LDLCALC", "TRIG", "CHOLHDL", "LDLDIRECT" in the last 72 hours. Thyroid Function Tests: No results for input(s): "TSH", "T4TOTAL", "FREET4", "T3FREE", "THYROIDAB" in the last 72 hours. Anemia Panel: No results for input(s): "VITAMINB12", "FOLATE", "FERRITIN", "TIBC", "IRON", "RETICCTPCT" in the last 72 hours. Sepsis Labs: No results for input(s): "PROCALCITON", "LATICACIDVEN" in the last 168 hours.  Recent Results (from the past 240 hour(s))  Blood culture (routine x 2)     Status: None (Preliminary result)   Collection Time: 08/08/22  2:00 AM   Specimen: Left Antecubital; Blood  Result Value Ref Range Status   Specimen Description   Final    LEFT ANTECUBITAL BLOOD Performed at [facility], [address] [place], [place] [address]    Special Requests   Final    BOTTLES DRAWN AEROBIC AND ANAEROBIC Blood Culture results may not  be optimal due to an excessive volume of blood received in culture bottles Performed at [facility] [address], [place], [place] [address]    Culture   Final    NO GROWTH 2 DAYS Performed at [facility] [address], [place], [place] [address]    Report Status PENDING  Incomplete  Blood culture (routine x 2)     Status: None (Preliminary result)   Collection Time: 08/08/22  2:05 AM   Specimen: Right Antecubital; Blood  Result Value Ref Range Status   Specimen Description   Final    RIGHT ANTECUBITAL BLOOD Performed at [facility], [place] [address] [place], [place] [address]    Special Requests   Final    BOTTLES DRAWN AEROBIC AND ANAEROBIC Blood Culture results may not be optimal due to an excessive volume of blood received in culture bottles Performed at [facility]  [address] [place], [place] [address]    Culture   Final    NO GROWTH 2 DAYS Performed at [facility] [address] [place], [place] [address]    Report Status PENDING  Incomplete  SARS Coronavirus 2 by RT PCR (hospital order, performed in [facility] hospital lab) *cepheid single result test* Anterior Nasal Swab     Status: None   Collection Time: 08/08/22 11:29 AM   Specimen: Anterior Nasal Swab  Result Value Ref Range Status   SARS Coronavirus 2 by RT PCR NEGATIVE NEGATIVE Final    Comment: (NOTE) SARS-CoV-2 target nucleic acids are NOT DETECTED.  The SARS-CoV-2 RNA is generally detectable in upper and lower respiratory specimens during the acute phase of infection. The lowest concentration of SARS-CoV-2 viral copies this assay can detect is 250 copies / mL. A negative result does not preclude SARS-CoV-2 infection and should not be used as the sole basis for treatment or other patient management decisions.  A negative result may occur with improper specimen collection / handling, submission of specimen other than nasopharyngeal swab, presence of viral mutation(s) within the areas targeted by this assay, and inadequate number of viral copies (<250 copies / mL). A negative result must be combined with clinical observations, patient history, and epidemiological information.  Fact Sheet for Patients:   https://www.patel.info/  Fact Sheet for Healthcare Providers: https://hall.com/  This test is not yet approved or  cleared by the [place] FDA and has been authorized for detection and/or diagnosis of SARS-CoV-2 by FDA under an Emergency Use Authorization (EUA).  This EUA will remain in effect (meaning this test can be used) for the duration of the COVID-19 declaration under Section 564(b)(1) of the Act, 21 U.S.C. section 360bbb-3(b)(1), unless the authorization is terminated or revoked sooner.  Performed at [facility], [place] [address] [place],  [address]          Radiology Studies: US THORACENTESIS ASP PLEURAL SPACE W/IMG GUIDE  Result Date: 08/10/2022 INDICATION: Diastolic heart failure with right pleural effusion. Request for diagnostic and therapeutic. EXAM: ULTRASOUND GUIDED RIGHT THORACENTESIS MEDICATIONS: 1% lidocaine 8 mL COMPLICATIONS: None immediate. PROCEDURE: An ultrasound guided thoracentesis was thoroughly discussed with the patient and questions answered. The benefits, risks, alternatives and complications were also discussed. The patient understands and wishes to proceed with the procedure. Written consent was obtained. Ultrasound was performed to localize and mark an adequate pocket of fluid in the right chest. The area was then prepped and draped in the normal sterile  fashion. 1% Lidocaine was used for local anesthesia. Under ultrasound guidance a 6 Fr Safe-T-Centesis catheter was introduced. Thoracentesis was performed. The catheter was removed and a dressing applied. FINDINGS: A total of approximately 750 mL of clear yellow fluid was removed. Samples were sent to the laboratory as requested by the clinical team. IMPRESSION: Successful ultrasound guided right thoracentesis yielding 750 mL of pleural fluid. No pneumothorax on post-procedure chest x-ray. Procedure performed by: Gareth Eagle, PA-C Electronically Signed   By: Aletta Edouard M.D.   On: 08/10/2022 12:46   DG CHEST PORT 1 VIEW  Result Date: 08/10/2022 CLINICAL DATA:  Post thoracentesis. EXAM: PORTABLE CHEST 1 VIEW COMPARISON:  August 08, 2022 and thoracentesis procedural images from August 10, 2022 FINDINGS: EKG leads project over the chest. Post median sternotomy for CABG and atrial appendage clipping. Cardiomediastinal contours and hilar structures are stable with cardiac enlargement. Stable appearance of LEFT-sided pleural effusion. Diminished graded opacity in the RIGHT chest compatible with diminished  RIGHT-sided pleural effusion. No signs of RIGHT-sided pneumothorax. No new areas of airspace disease with RIGHT hilar fullness as on previous imaging. Added density in the RIGHT infrahilar region compatible with known masslike area. On limited assessment there is no acute skeletal process. IMPRESSION: 1. Diminished RIGHT-sided pleural effusion. No signs of pneumothorax. 2. Stable LEFT-sided pleural effusion. 3. RIGHT infrahilar masslike area as on previous imaging. Findings are highly concerning for neoplasm. Would also correlate with any ongoing signs of infection that could indicate pulmonary abscess. 4. Post median sternotomy for CABG and LEFT atrial clipping with signs of cardiomegaly as before. Electronically Signed   By: Zetta Bills M.D.   On: 08/10/2022 10:45   ECHOCARDIOGRAM COMPLETE  Result Date: 08/09/2022    ECHOCARDIOGRAM REPORT   Patient Name:   Maria [name] Date of Exam: 08/09/2022 Medical Rec #:  [id number]         Height:       60.0 in Accession #:    [id number]        Weight:       89.7 lb Date of Birth:  1923-11-28         BSA:          1.328 m Patient Age:    89 years          BP:           132/83 mmHg Patient Gender: F                 HR:           101 bpm. Exam Location:  Inpatient Procedure: 2D Echo, 3D Echo, Cardiac Doppler and Color Doppler Indications:    CHF-Acute Diastolic T51.76  History:        Patient has prior history of Echocardiogram examinations, most                 recent 11/27/2018. CHF, CAD, Prior CABG, Stroke, Carotid Disease                 and Pulmonary HTN, Arrythmias:Atrial Fibrillation,                 Signs/Symptoms:Chest Pain; Risk Factors:Hypertension.  Sonographer:    Darlina Sicilian RDCS Referring Phys: [id number] [place]  1. Left ventricular ejection fraction, by estimation, is 60 to 65%. Left ventricular ejection fraction by 3D volume is 65 %. The left ventricle has normal function. The left ventricle has no regional wall motion  abnormalities. There  is moderate asymmetric left ventricular hypertrophy of the basal-septal segment. Diastolic function indeterminant due to Afib. Elevated left atrial pressure.  2. Right ventricular systolic function is mildly reduced. The right ventricular size is mildly enlarged. There is normal pulmonary artery systolic pressure. The estimated right ventricular systolic pressure is 96.7 mmHg.  3. Left atrial size was severely dilated.  4. Right atrial size was severely dilated.  5. Moderate pleural effusion in both left and right lateral regions.  6. The mitral valve is degenerative. Mild to moderate mitral valve regurgitation.  7. Tricuspid valve regurgitation is mild to moderate.  8. The aortic valve is tricuspid. There is mild calcification of the aortic valve. There is mild thickening of the aortic valve. Aortic valve regurgitation is mild. Aortic valve sclerosis/calcification is present, without any evidence of aortic stenosis.  9. Aortic dilatation noted. There is borderline dilatation of the ascending aorta, measuring 38 mm. 10. The inferior vena cava is normal in size with greater than 50% respiratory variability, suggesting right atrial pressure of 3 mmHg. Comparison(s): Compared to prior TTE in 2020, there are now bilateral pleural effusions. Otherwise, there is no significant change. FINDINGS  Left Ventricle: Left ventricular ejection fraction, by estimation, is 60 to 65%. Left ventricular ejection fraction by 3D volume is 65 %. The left ventricle has normal function. The left ventricle has no regional wall motion abnormalities. The left ventricular internal cavity size was normal in size. There is moderate asymmetric left ventricular hypertrophy of the basal-septal segment. Diastolic function indeterminant due to Afib. Elevated left atrial pressure. Right Ventricle: The right ventricular size is mildly enlarged. No increase in right ventricular wall thickness. Right ventricular systolic function is  mildly reduced. There is normal pulmonary artery systolic pressure. The tricuspid regurgitant velocity  is 2.66 m/s, and with an assumed right atrial pressure of 3 mmHg, the estimated right ventricular systolic pressure is 59.1 mmHg. Left Atrium: Left atrial size was severely dilated. Right Atrium: Right atrial size was severely dilated. Pericardium: There is no evidence of pericardial effusion. Mitral Valve: The mitral valve is degenerative in appearance. There is mild thickening of the mitral valve leaflet(s). There is mild calcification of the mitral valve leaflet(s). Mild to moderate mitral annular calcification. Mild to moderate mitral valve regurgitation. Tricuspid Valve: The tricuspid valve is normal in structure. Tricuspid valve regurgitation is mild to moderate. Aortic Valve: The aortic valve is tricuspid. There is mild calcification of the aortic valve. There is mild thickening of the aortic valve. Aortic valve regurgitation is mild. Aortic regurgitation PHT measures 567 msec. Aortic valve sclerosis/calcification is present, without any evidence of aortic stenosis. Pulmonic Valve: The pulmonic valve was normal in structure. Pulmonic valve regurgitation is trivial. Aorta: Aortic dilatation noted. There is borderline dilatation of the ascending aorta, measuring 38 mm. Venous: The inferior vena cava is normal in size with greater than 50% respiratory variability, suggesting right atrial pressure of 3 mmHg. IAS/Shunts: The atrial septum is grossly normal. Additional Comments: There is a moderate pleural effusion in both left and right lateral regions.  LEFT VENTRICLE PLAX 2D LVIDd:         4.50 cm         Diastology LVIDs:         2.80 cm         LV e' medial:    3.85 cm/s LV PW:         1.00 cm         LV E/e' medial:  28.2  LV IVS:        0.90 cm         LV e' lateral:   7.69 cm/s                                LV E/e' lateral: 14.1                                 3D Volume EF                                 LV 3D EF:    Left                                             ventricul                                             ar                                             ejection                                             fraction                                             by 3D                                             volume is                                             65 %.                                 3D Volume EF:                                3D EF:        65 % RIGHT VENTRICLE RV Basal diam:  3.70 cm RV Mid diam:    2.30 cm RV S prime:     7.42 cm/s LEFT ATRIUM             Index        RIGHT ATRIUM           Index LA diam:        4.30  cm 3.24 cm/m   RA Area:     14.90 cm LA Vol (A2C):   56.0 ml 42.18 ml/m  RA Volume:   32.50 ml  24.48 ml/m LA Vol (A4C):   77.6 ml 58.48 ml/m LA Biplane Vol: 72.9 ml 54.91 ml/m  AORTIC VALVE LVOT Vmax:   96.70 cm/s LVOT Vmean:  56.000 cm/s LVOT VTI:    0.160 m AI PHT:      567 msec  AORTA Ao Root diam: 2.50 cm Ao Asc diam:  3.80 cm MITRAL VALVE                  TRICUSPID VALVE MV Area (PHT): 5.39 cm       TR Peak grad:   28.3 mmHg MV Decel Time: 141 msec       TR Vmax:        266.00 cm/s MR Peak grad:    90.6 mmHg MR Mean grad:    56.0 mmHg    SHUNTS MR Vmax:         476.00 cm/s  Systemic VTI: 0.16 m MR Vmean:        351.0 cm/s MR PISA:         1.01 cm MR PISA Eff ROA: 8 mm MR PISA Radius:  0.40 cm MV E velocity: 108.67 cm/s Gwyndolyn Kaufman MD Electronically signed by Gwyndolyn Kaufman MD Signature Date/Time: 08/09/2022/3:26:12 PM    Final    CT CHEST WO CONTRAST  Result Date: 08/09/2022 CLINICAL DATA:  Shortness of breath EXAM: CT CHEST WITHOUT CONTRAST TECHNIQUE: Multidetector CT imaging of the chest was performed following the standard protocol without IV contrast. RADIATION DOSE REDUCTION: This exam was performed according to the departmental dose-optimization program which includes automated exposure control, adjustment of the mA and/or kV according to  patient size and/or use of iterative reconstruction technique. COMPARISON:  Previous CT done on 07/12/2022 and chest radiograph done on 08/08/2022 FINDINGS: Cardiovascular: Extensive coronary artery calcifications are seen. There is evidence of previous coronary bypass surgery. Heart is enlarged in size. There is a metallic clamp in the region of left atrial appendage. Scattered calcifications are seen in thoracic aorta. There is ectasia of ascending thoracic aorta measuring 3.9 cm. Mediastinum/Nodes: No new significant lymphadenopathy seen. Lungs/Pleura: Moderate to large bilateral pleural effusions are seen, more so on the right side with interval increase. There is interval appearance of multiple patchy ground-glass infiltrates of varying sizes in both upper and both mid lung fields. There is compression atelectasis in the lower lung fields, more so on the right side. There is 5.3 x 3.7 cm area of low-density with pocket of air in right lower lobe. Possibility of malignant neoplasm with necrosis should be considered. There is interval increase in size. There is no pneumothorax. Upper Abdomen: Arterial calcifications are seen in abdominal aorta. Musculoskeletal: There is decrease in height of multiple thoracic vertebral bodies with no significant interval change. IMPRESSION: Moderate to large bilateral pleural effusions, more so on the right side with interval increase. There is interval appearance of multiple patchy ground-glass infiltrates of varying sizes in both lungs suggesting multifocal pneumonia. Please correlate for possible COVID pneumonia. There is compression atelectasis in both lower lung fields, more so on the right side. There is 5.3 x 3.7 cm area of low-density with small area of cavitation in right lower lobe suggesting possible malignant neoplasm with interval increase in size. Cardiomegaly.  Coronary artery disease. Other findings as described in the body of the report. Electronically  Signed    By: Elmer Picker M.D.   On: 08/09/2022 10:13        Scheduled Meds:  acidophilus  1 capsule Oral Daily   amiodarone  200 mg Oral QHS   apixaban  2.5 mg Oral BID   Chlorhexidine Gluconate Cloth  6 each Topical Daily   cholecalciferol  400 Units Oral Daily   diltiazem  120 mg Oral Daily   docusate sodium  100 mg Oral QHS   doxycycline  100 mg Oral Q12H   ferrous sulfate  325 mg Oral Q breakfast   furosemide  40 mg Intravenous Daily   lactose free nutrition  237 mL Oral TID WC   levothyroxine  25 mcg Oral Q0600   melatonin  5 mg Oral QHS   polyethylene glycol  17 g Oral Daily   polyvinyl alcohol  1 drop Both Eyes QHS   sertraline  50 mg Oral q morning   sodium chloride  1 g Oral TID   Continuous Infusions:  cefTRIAXone (ROCEPHIN)  IV 2 g (08/09/22 1322)     LOS: 2 days    Time spent: 35 minutes    Barb Merino, MD [facility] Pager [phone]

## 2022-08-10 NOTE — Progress Notes (Signed)
PT Cancellation Note  Patient Details Name: Maria [name] MRN: [id number] DOB: 1924-04-15   Cancelled Treatment:    Reason Eval/Treat Not Completed: Patient at procedure or test/unavailable PT orders received, chart reviewed. Per OT note, pt being taken off unit for thoracentesis. Will f/u as able & as pt is available.  Lavone Nian, PT, DPT 08/10/22, 9:58 AM   Waunita Schooner 08/10/2022, 9:57 AM

## 2022-08-10 NOTE — Progress Notes (Signed)
OT Cancellation Note  Patient Details Name: HARINI DEARMOND MRN: [id number] DOB: 1924/03/07   Cancelled Treatment:    Reason Eval/Treat Not Completed: Other (comment) Patient was set up for session with transport arriving to take patient to thoracentesis. OT to continue to follow and check back as schedule will allow.  Rennie Plowman, [facility] Office# [phone]  08/10/2022, 9:53 AM

## 2022-08-10 NOTE — Evaluation (Addendum)
Occupational Therapy Evaluation Patient Details Name: Maria [name] MRN: [id number] DOB: 08/31/1924 Today's Date: 08/10/2022   History of Present Illness Patient is a 88 year old female who presented with hypoxemia and SOB from her SNF. patient was found to have acute hypoxemic respiratory failure, bilateral pleural effusion, right lower lobe tumor.   PMH: stroke, chronic diastolic CHF, A fib, HTN, 8/18-8/24 L pubic rami,and L distal radius fracture, chronic T10, L1, L2 fx.   Clinical Impression   Patient is a 88 year old female who was admitted for above. Patient was noted to have had recent pubic rami fracture and L distal radisu fracture. Per secure chat on 10/17 with Dr. Dion Saucier patient is WBAT on LUE. Patient was mod A for transfers with increased assistance to ensure she does not plop into recliner. Patient session was limited with patient eager to eat lunch at this time. Patient was noted to have decreased functional activity tolerance, decreased endurance, decreased standing balance, decreased safety awareness, and decreased knowledge of AD/AE impacting participation in ADLs. Patient would continue to benefit from skilled OT services at this time while admitted and after d/c to address noted deficits in order to improve overall safety and independence in ADLs.     Recommendations for follow up therapy are one component of a multi-disciplinary discharge planning process, led by the attending physician.  Recommendations may be updated based on patient status, additional functional criteria and insurance authorization.   Follow Up Recommendations  Skilled nursing-short term rehab (<3 hours/day)    Assistance Recommended at Discharge Frequent or constant Supervision/Assistance  Patient can return home with the following A lot of help with walking and/or transfers;A lot of help with bathing/dressing/bathroom;Assistance with cooking/housework;Direct supervision/assist for financial  management;Assist for transportation;Direct supervision/assist for medications management;Help with stairs or ramp for entrance    Functional Status Assessment  Patient has had a recent decline in their functional status and demonstrates the ability to make significant improvements in function in a reasonable and predictable amount of time.  Equipment Recommendations  None recommended by OT    Recommendations for Other Services       Precautions / Restrictions Precautions Precaution Comments: wrist brace L wrist, WBAT LUE per secure chat with Dr.Landau Restrictions Weight Bearing Restrictions: No      Mobility Bed Mobility Overal bed mobility: Needs Assistance Bed Mobility: Supine to Sit     Supine to sit: Min assist, HOB elevated     General bed mobility comments: with increased time and cues for proper hand and foot placement.        Balance Overall balance assessment: Mild deficits observed, not formally tested           ADL either performed or assessed with clinical judgement   ADL Overall ADL's : Needs assistance/impaired Eating/Feeding: Set up;Sitting   Grooming: Set up;Sitting   Upper Body Bathing: Minimal assistance;Sitting   Lower Body Bathing: Sit to/from stand;Sitting/lateral leans;Moderate assistance   Upper Body Dressing : Minimal assistance;Sitting Upper Body Dressing Details (indicate cue type and reason): to don robe over hospital gown with increased time. Lower Body Dressing: Maximal assistance;Sit to/from stand;Sitting/lateral leans   Toilet Transfer: Moderate assistance;Rolling walker (2 wheels);Ambulation Toilet Transfer Details (indicate cue type and reason): to transfer to recliner patient needed cues for proper positiongin and incresed physical A to prevent plopping into recliner. Toileting- Clothing Manipulation and Hygiene: Maximal assistance;Sit to/from stand               Vision Patient  Visual Report: No change from  baseline       Perception     Praxis      Pertinent Vitals/Pain Pain Assessment Pain Assessment: No/denies pain     Hand Dominance Right   Extremity/Trunk Assessment Upper Extremity Assessment Upper Extremity Assessment: LUE deficits/detail LUE Deficits / Details: wirst brace in place. WBAT per Ortho. patient reported " i need to let pain guide me".   Lower Extremity Assessment Lower Extremity Assessment: Defer to PT evaluation   Cervical / Trunk Assessment Cervical / Trunk Assessment: Kyphotic   Communication Communication Communication: HOH   Cognition Arousal/Alertness: Awake/alert Behavior During Therapy: WFL for tasks assessed/performed Overall Cognitive Status: Within Functional Limits for tasks assessed           General Comments: patient is HOH. family in room to provid PLOF     General Comments               Home Living Family/patient expects to be discharged to:: Skilled nursing facility           Additional Comments: patient lives at [facility] at rollator level baseline      Prior Functioning/Environment Prior Level of Function : Independent/Modified Independent             Mobility Comments: used rollator at all times per pt ADLs Comments: completed ADLs MOD I, hired cleaning services and facility provided meals        OT Problem List: Decreased activity tolerance;Impaired balance (sitting and/or standing);Decreased safety awareness;Decreased knowledge of precautions;Decreased knowledge of use of DME or AE;Pain;Impaired UE functional use      OT Treatment/Interventions: Self-care/ADL training;Therapeutic exercise;Neuromuscular education;Energy conservation;DME and/or AE instruction;Therapeutic activities;Balance training;Patient/family education    OT Goals(Current goals can be found in the care plan section) Acute Rehab OT Goals Patient Stated Goal: to get back to [facility] OT Goal Formulation: With patient/family Time  For Goal Achievement: 08/24/22 Potential to Achieve Goals: Fair  OT Frequency: Min 2X/week       AM-PAC OT "6 Clicks" Daily Activity     Outcome Measure Help from another person eating meals?: A Little Help from another person taking care of personal grooming?: A Little Help from another person toileting, which includes using toliet, bedpan, or urinal?: A Lot Help from another person bathing (including washing, rinsing, drying)?: A Lot Help from another person to put on and taking off regular upper body clothing?: A Little Help from another person to put on and taking off regular lower body clothing?: A Lot 6 Click Score: 15   End of Session Equipment Utilized During Treatment: Gait belt;Rolling walker (2 wheels) Nurse Communication: Mobility status  Activity Tolerance: Patient tolerated treatment well Patient left: in chair;with call bell/phone within reach;with family/visitor present;with nursing/sitter in room  OT Visit Diagnosis: Unsteadiness on feet (R26.81);Other abnormalities of gait and mobility (R26.89);Muscle weakness (generalized) (M62.81);Pain                Time: 2956-2130 OT Time Calculation (min): 25 min Charges:  OT General Charges $OT Visit: 1 Visit OT Evaluation $OT Eval Moderate Complexity: 1 Mod OT Treatments $Self Care/Home Management : 8-22 mins  Rosalio Loud, MS Acute Rehabilitation Department Office# [phone]   Barnabas Lister Cache Bills 08/10/2022, 1:29 PM

## 2022-08-10 NOTE — Procedures (Signed)
PROCEDURE SUMMARY:  Successful US guided right thoracentesis. Yielded 750 mL of clear yellow fluid. Patient tolerated procedure well. No immediate complications. EBL = trace  Specimen was sent for labs.  Post procedure chest X-ray reveals no pneumothorax  Wyatt Galvan S Darrik Richman PA-C 08/10/2022 12:35 PM

## 2022-08-10 NOTE — Evaluation (Addendum)
Physical Therapy Evaluation Patient Details Name: Maria [name] MRN: [id number] DOB: 1924-06-02 Today's Date: 08/10/2022  History of Present Illness  Pt is a 88 y/o F admitted on 08/08/22 after presenting to the ED with SOB & hypoxemia. Of note, pt was admitted 2 months ago 2/2 traumatic pelvic & L wrist fx & was d/c to SNF. Since then pt has had worsening SOB & was diagnosed with pleural effusion, R lower lobe tumor & was scheduled for PET scan on 10/18. Pleural effusion was negative for malignancy. Pt is being treated for acute hypoxemic respiratory failure, B pleural effusion, transudate on recent thoracentesis, recurrent R>L, and R lower lobe tumor increasing & cavitating. PMH: paroxysmal a-fib on eliquis, stroke, HTN, chronic diastolic heart failure, chronic T10, L1, L2 fxs  Clinical Impression  Pt seen for PT evaluation with pt agreeable & multiple family members in room during session. Prior to initial hospitalization pt was living alone in [facility], mod I with rollator, driving to the dining hall. After initial hospital admission pt was in SNF rehab & reports walking up to 240 ft with PFRW. On this date, PT reiterates ability to weight bear as tolerated through L wrist & pt already with braced donned. Pt completes multiple STS from recliner with min increasing to mod assist 2/2 pt fatigue. Pt ambulates in room x 2 with seated rest break in between. Pt requires min assist with use of RW for gait, but pt experiences 1 LOB & requires mod assist to correct. Pt endorses fatigue at end of session but is doing well with functional mobility & with very supportive family. Pt would benefit from STR upon d/c to maximize independence with functional mobility & reduce fall risk prior to return home. Will continue to follow pt acutely to address balance, strengthening, activity tolerance, and gait with LRAD.   Addendum:  BP checked in LUE: Sitting: 94/56 mmHg MAP 68 Standing: 100/51 mmHg MAP 67 Pt  with c/o dizziness upon standing but reports they don't worsen - nurse notified   Recommendations for follow up therapy are one component of a multi-disciplinary discharge planning process, led by the attending physician.  Recommendations may be updated based on patient status, additional functional criteria and insurance authorization.  Follow Up Recommendations Skilled nursing-short term rehab (<3 hours/day) Can patient physically be transported by private vehicle: Yes    Assistance Recommended at Discharge Intermittent Supervision/Assistance  Patient can return home with the following  A little help with walking and/or transfers;A little help with bathing/dressing/bathroom;Assistance with cooking/housework;Assist for transportation;Help with stairs or ramp for entrance    Equipment Recommendations None recommended by PT  Recommendations for Other Services       Functional Status Assessment Patient has had a recent decline in their functional status and demonstrates the ability to make significant improvements in function in a reasonable and predictable amount of time.     Precautions / Restrictions Precautions Precautions: Fall Precaution Comments: wrist brace L wrist, WBAT LUE per secure chat with Dr.Landau Restrictions Weight Bearing Restrictions: Yes LUE Weight Bearing: Weight bearing as tolerated      Mobility  Bed Mobility               General bed mobility comments: pt received & left sitting in recliner    Transfers Overall transfer level: Needs assistance Equipment used: Rolling walker (2 wheels) Transfers: Sit to/from Stand Sit to Stand: Min assist, Mod assist           General transfer  comment: Pt requires min increasing to mod assist for STS with min cuing for safe hand placement. Pt requires cuing for increased BOS as pt with BLE knees turned to L & touching with feet very close together as well.    Ambulation/Gait Ambulation/Gait assistance: Min  assist Gait Distance (Feet): 12 Feet (+ 20 ft) Assistive device: Rolling walker (2 wheels) Gait Pattern/deviations: Decreased step length - right, Decreased step length - left, Decreased dorsiflexion - right, Decreased dorsiflexion - left, Decreased stride length Gait velocity: decreased     General Gait Details: Pt with decreased weight bearing through L wrist on RW. Pt turned towards L & requires cuing to ambulate within base of AD vs pushing it slightly out in front. Pt with 1 LOB towards end of 2nd gait trial with LOB requiring mod assist to correct.  Stairs            Wheelchair Mobility    Modified Rankin (Stroke Patients Only)       Balance Overall balance assessment: Needs assistance Sitting-balance support: Feet supported, Bilateral upper extremity supported Sitting balance-Leahy Scale: Good     Standing balance support: During functional activity, No upper extremity supported, Bilateral upper extremity supported Standing balance-Leahy Scale: Poor Standing balance comment: Posterior lean in standing, cuing for anterior pelvic shift & manual faciliation as well.                             Pertinent Vitals/Pain Pain Assessment Pain Assessment: Faces Faces Pain Scale: Hurts a little bit Pain Location: LUE Pain Descriptors / Indicators: Discomfort, Guarding Pain Intervention(s): Monitored during session    Home Living Family/patient expects to be discharged to:: Skilled nursing facility                   Additional Comments: Prior to initial admission pt was living in [facility].    Prior Function Prior Level of Function : Independent/Modified Independent             Mobility Comments: Prior to initial admission pt was mod I with RW, living alone, driving to the dining hall. Since initial admission pt has been in SNF rehab & pt reports she ambulated a max of 240 ft with PFRW. ADLs Comments: completed ADLs MOD I, hired cleaning services  and facility provided meals     Hand Dominance   Dominant Hand: Right    Extremity/Trunk Assessment   Upper Extremity Assessment Upper Extremity Assessment: LUE deficits/detail LUE Deficits / Details: Pt educated on WBAT LUE. Pt with wrist brace donned throughout the session. Pt reports she's not using L wrist much and she doesn't want to bend it forwards or backwards as she's fearful of breaking it again.    Lower Extremity Assessment Lower Extremity Assessment: Generalized weakness    Cervical / Trunk Assessment Cervical / Trunk Assessment: Kyphotic  Communication   Communication: HOH (hearing aides but still HOH)  Cognition Arousal/Alertness: Awake/alert Behavior During Therapy: WFL for tasks assessed/performed Overall Cognitive Status: Within Functional Limits for tasks assessed                                 General Comments: Pt is oriented to self, time, & location but not situation. Follows simple commands throughout session.        General Comments General comments (skin integrity, edema, etc.): Pt on 2.5L/min via nasal cannula  with lowest SpO2 of 88% but pt able to recover >/= 90% with rest.    Exercises     Assessment/Plan    PT Assessment Patient needs continued PT services  PT Problem List Decreased strength;Cardiopulmonary status limiting activity;Decreased activity tolerance;Decreased balance;Decreased mobility;Decreased knowledge of use of DME;Decreased safety awareness;Decreased knowledge of precautions;Pain       PT Treatment Interventions DME instruction;Therapeutic exercise;Gait training;Balance training;Functional mobility training;Therapeutic activities;Patient/family education;Neuromuscular re-education;Modalities;Manual techniques    PT Goals (Current goals can be found in the Care Plan section)  Acute Rehab PT Goals Patient Stated Goal: get better, walk PT Goal Formulation: With patient/family Time For Goal Achievement:  08/24/22 Potential to Achieve Goals: Good    Frequency Min 3X/week     Co-evaluation               AM-PAC PT "6 Clicks" Mobility  Outcome Measure Help needed turning from your back to your side while in a flat bed without using bedrails?: None Help needed moving from lying on your back to sitting on the side of a flat bed without using bedrails?: A Little Help needed moving to and from a bed to a chair (including a wheelchair)?: A Little Help needed standing up from a chair using your arms (e.g., wheelchair or bedside chair)?: A Little Help needed to walk in hospital room?: A Little Help needed climbing 3-5 steps with a railing? : A Lot 6 Click Score: 18    End of Session Equipment Utilized During Treatment: Oxygen;Gait belt Activity Tolerance: Patient tolerated treatment well Patient left: in chair;with call bell/phone within reach;with family/visitor present Nurse Communication: Mobility status (BP) PT Visit Diagnosis: Unsteadiness on feet (R26.81);Muscle weakness (generalized) (M62.81)    Time: 1452-1530 PT Time Calculation (min) (ACUTE ONLY): 38 min   Charges:   PT Evaluation $PT Eval Moderate Complexity: 1 Mod PT Treatments $Therapeutic Activity: 8-22 mins        Lavone Nian, PT, DPT 08/10/22, 3:55 PM   Waunita Schooner 08/10/2022, 3:50 PM

## 2022-08-11 ENCOUNTER — Inpatient Hospital Stay (HOSPITAL_COMMUNITY): Payer: Medicare Other

## 2022-08-11 ENCOUNTER — Inpatient Hospital Stay (HOSPITAL_COMMUNITY): Admission: RE | Admit: 2022-08-11 | Payer: Medicare Other | Source: Ambulatory Visit

## 2022-08-11 DIAGNOSIS — I5033 Acute on chronic diastolic (congestive) heart failure: Secondary | ICD-10-CM | POA: Diagnosis not present

## 2022-08-11 DIAGNOSIS — R918 Other nonspecific abnormal finding of lung field: Secondary | ICD-10-CM | POA: Diagnosis not present

## 2022-08-11 DIAGNOSIS — E871 Hypo-osmolality and hyponatremia: Secondary | ICD-10-CM | POA: Diagnosis not present

## 2022-08-11 LAB — GLUCOSE, CAPILLARY: Glucose-Capillary: 90 mg/dL (ref 70–99)

## 2022-08-11 MED ORDER — MELATONIN 5 MG PO TABS
5.0000 mg | ORAL_TABLET | Freq: Once | ORAL | Status: AC
  Administered 2022-08-11: 5 mg via ORAL
  Filled 2022-08-11: qty 1

## 2022-08-11 MED ORDER — FLUDEOXYGLUCOSE F - 18 (FDG) INJECTION
5.0100 | Freq: Once | INTRAVENOUS | Status: AC | PRN
  Administered 2022-08-11: 5.01 via INTRAVENOUS

## 2022-08-11 MED ORDER — MELATONIN 5 MG PO TABS: 5.0000 mg | ORAL_TABLET | Freq: Every evening | ORAL | Status: DC | PRN

## 2022-08-11 MED ORDER — BISMUTH SUBSALICYLATE 262 MG/15ML PO SUSP
30.0000 mL | Freq: Once | ORAL | Status: AC
  Administered 2022-08-11: 30 mL via ORAL
  Filled 2022-08-11: qty 236

## 2022-08-11 NOTE — Care Management Important Message (Signed)
Important Message  Patient Details IM Letter placed in Patients room. Name: Maria [name] MRN: [id number] Date of Birth: 1924-05-28   Medicare Important Message Given:  Yes     Kerin Salen 08/11/2022, 1:54 PM

## 2022-08-11 NOTE — Care Management (Signed)
Family requested me to explain PET scan findings that was done today.  Met with multiple family members, we discussed about PET scan indicating possible ascending colon cancer with local metastasis as well as a solitary metastatic lesion in the liver.  Right lower lobe tumor is also likely cancer unknown whether it is primary or metastatic from colon cancer.  I updated patient about the same, she was appreciative of the explanation and appropriately worried about it.  We discussed about possible transition to skilled nursing facility with rehab and hospice follow-up.  I will update her pulmonologist about the results.  Plan: Transfer back to [facility] with rehab.  Consult palliative and hospice. DNR/DNI.  Will fill out MOST form and patient family interested on signing do not hospitalize orders. We will consult palliative care in the hospital to help coordinate care.

## 2022-08-11 NOTE — Progress Notes (Signed)
PROGRESS NOTE    Maria [name]  RAX:[id number] DOB: April 26, 1924 DOA: 08/08/2022 PCP: [name], MD    Brief Narrative:  88 year old with history of paroxysmal A-fib on Eliquis, history of stroke, hypertension, chronic diastolic heart failure presented to the emergency room with shortness of breath and hypoxemia from skilled nursing facility.  Patient was admitted to the hospital 2 months ago with traumatic pelvic fracture and left wrist fracture and discharged to a SNF.  Since then she had worsening shortness of breath and was diagnosed with pleural effusion, right lower lobe tumor and was scheduled for PET scan on 10/18 by Pulmonary.  Pleural fluid was negative for malignancy.  Brought to the ER with persistent symptoms.  She is coughing and nauseated with cough.  COVID-19 negative.  She was on doxycycline before coming to the ER.   Assessment & Plan:   Acute hypoxemic respiratory failure, presentation oxygen 83% on room air and shortness of breath corrected with supplemental oxygen. Bilateral pleural effusion, transudate on recent thoracentesis, recurrent right more than left. Right lower lobe tumor, increasing and cavitating on repeat CT scan.  Patient has groundglass opacity with COVID-19 test was negative.  Cough and productive sputum.  Sputum cultures negative so far.  Blood cultures negative so far.  Continue Rocephin and doxycycline along with aggressive chest physiotherapy, incentive spirometry, deep breathing exercises, sputum induction, mucolytic's and bronchodilators.  Mobilize with PT OT. Supplemental oxygen to keep saturations more than 90%. Repeat thoracentesis 750 mL drained.  Transudate.  Cytology sent. The cavitating mass is likely malignant tumor or necrotizing pneumonia.  Radiology was able to schedule PET scan when patient is in the hospital.  Paroxysmal A-fib, currently sinus rhythm on amiodarone and Cardizem.  Eliquis resumed.  Chronic diastolic congestive  heart failure, unlikely contributing.  Continue Lasix IV today for symptom relief.  Debility with recent pelvic fracture and left wrist fracture, work with PT OT.  May still need more rehab.  Hypothyroidism, on Synthroid.  Resumed.  Hyponatremia, chronic and persistent.  Chronic indwelling Foley catheter secondary to immobility and recent pelvic fracture, failed voiding trial.  Continue catheter care until improvement of mobility.  Goals of care: Multiple discussions with patient, multiple family members and patient's daughters. Patient is well aware about her diagnosis, she wants to know what is the cavitating lesion is. Liberalize diet to regular diet and take of restrictions. Depends upon diagnosis from PET scan, will suggest palliation and hospice if that is suggestive of cancer.  If it is suggestive of pneumonia, she will still benefit with Palliation and hospice.      DVT prophylaxis: apixaban (ELIQUIS) tablet 2.5 mg Start: 08/10/22 1400   apixaban (ELIQUIS) tablet 2.5 mg   Code Status: DNR/DNI Family Communication: Granddaughter at the bedside. Disposition Plan: Status is: Inpatient Remains inpatient appropriate because: Significant shortness of breath, pneumonia, new diagnosis of lung tumor     Consultants:  PCCM, curbside  Procedures:  Thoracentesis, 750 mL right side  Antimicrobials:  Rocephin and doxycycline 10/16----   Subjective:  Patient seen and examined.  Hard of hearing.  Still has cough with mucoid production.  She is more concerned about discussion had about PET scan and how we have difficulties with the scheduling.  Patient tells me that she needs to evaluate that at the cancer and if she has any metastatic disease.  Objective: Vitals:   08/10/22 1136 08/10/22 1502 08/10/22 2049 08/11/22 0428  BP: (!) 95/55 (!) 94/56 124/64 (!) 117/55  Pulse: 82 72  82 99  Resp:  18 17 16   Temp: 97.8 F (36.6 C) (!) 97.4 F (36.3 C) 98 F (36.7 C) 97.8 F (36.6  C)  TempSrc: Oral Oral Oral Oral  SpO2: 97% 97% 90% 95%  Weight:      Height:        Intake/Output Summary (Last 24 hours) at 08/11/2022 1124 Last data filed at 08/11/2022 0100 Gross per 24 hour  Intake 100 ml  Output 275 ml  Net -175 ml   Filed Weights   08/08/22 0144 08/09/22 0649  Weight: 42.6 kg 40.7 kg    Examination:  General exam: Frail and debilitated, nagging dry cough. Respiratory system: Poor bilateral air entry.  Expiratory crackles and end expiratory wheezes. Cardiovascular system: S1 & S2 heard, RRR. No JVD, murmurs, rubs, gallops or clicks. No pedal edema.  Bevelyn Buckles' sign negative. Gastrointestinal system: Abdomen is nondistended, soft and nontender. No organomegaly or masses felt. Normal bowel sounds heard. Central nervous system: Alert and oriented. No focal neurological deficits. Frail and debilitated.  Thin and cachectic.    Data Reviewed: I have personally reviewed following labs and imaging studies  CBC: Recent Labs  Lab 08/08/22 0200 08/09/22 0318  WBC 8.4 8.0  NEUTROABS 7.2  --   HGB 10.6* 10.4*  HCT 31.3* 31.1*  MCV 89.4 88.6  PLT 491* 035*   Basic Metabolic Panel: Recent Labs  Lab 08/08/22 0200 08/08/22 0953 08/09/22 0318  NA 127* 127* 129*  K 2.9* 3.3* 3.4*  CL 95* 95* 96*  CO2 26 23 27   GLUCOSE 118* 98 100*  BUN 9 9 9   CREATININE 0.42* 0.54 0.36*  CALCIUM 7.9* 7.9* 8.1*  MG  --  1.9  --   PHOS  --  3.0  --    GFR: Estimated Creatinine Clearance: 26.4 mL/min (A) (by C-G formula based on SCr of 0.36 mg/dL (L)). Liver Function Tests: Recent Labs  Lab 08/08/22 0953 08/09/22 0318  AST  --  101*  ALT  --  56*  ALKPHOS  --  98  BILITOT  --  0.5  PROT  --  6.0*  ALBUMIN 2.5* 2.3*   No results for input(s): "LIPASE", "AMYLASE" in the last 168 hours. No results for input(s): "AMMONIA" in the last 168 hours. Coagulation Profile: No results for input(s): "INR", "PROTIME" in the last 168 hours. Cardiac Enzymes: No results  for input(s): "CKTOTAL", "CKMB", "CKMBINDEX", "TROPONINI" in the last 168 hours. BNP (last 3 results) Recent Labs    10/02/21 1529  PROBNP 1,013*   HbA1C: No results for input(s): "HGBA1C" in the last 72 hours. CBG: No results for input(s): "GLUCAP" in the last 168 hours. Lipid Profile: No results for input(s): "CHOL", "HDL", "LDLCALC", "TRIG", "CHOLHDL", "LDLDIRECT" in the last 72 hours. Thyroid Function Tests: No results for input(s): "TSH", "T4TOTAL", "FREET4", "T3FREE", "THYROIDAB" in the last 72 hours. Anemia Panel: No results for input(s): "VITAMINB12", "FOLATE", "FERRITIN", "TIBC", "IRON", "RETICCTPCT" in the last 72 hours. Sepsis Labs: No results for input(s): "PROCALCITON", "LATICACIDVEN" in the last 168 hours.  Recent Results (from the past 240 hour(s))  Blood culture (routine x 2)     Status: None (Preliminary result)   Collection Time: 08/08/22  2:00 AM   Specimen: Left Antecubital; Blood  Result Value Ref Range Status   Specimen Description   Final    LEFT ANTECUBITAL BLOOD Performed at [facility], [address] [place], [place] [address]    Special Requests   Final  BOTTLES DRAWN AEROBIC AND ANAEROBIC Blood Culture results may not be optimal due to an excessive volume of blood received in culture bottles Performed at [facility], [place] [address], [place], [place] [address]    Culture   Final    NO GROWTH 3 DAYS Performed at [facility], [place] [address], [place], [place] [address]    Report Status PENDING  Incomplete  Blood culture (routine x 2)     Status: None (Preliminary result)   Collection Time: 08/08/22  2:05 AM   Specimen: Right Antecubital; Blood  Result Value Ref Range Status   Specimen Description   Final    RIGHT ANTECUBITAL BLOOD Performed at [facility], [place] [address] [place] [address] [place], [place] [address]    Special Requests   Final    BOTTLES DRAWN AEROBIC AND ANAEROBIC Blood Culture results may not be  optimal due to an excessive volume of blood received in culture bottles Performed at [place] [address], [place], [place] [address]    Culture   Final    NO GROWTH 3 DAYS Performed at [facility], [place] [address], [place], [place] [address]    Report Status PENDING  Incomplete  SARS Coronavirus 2 by RT PCR (hospital order, performed in [facility] hospital lab) *cepheid single result test* Anterior Nasal Swab     Status: None   Collection Time: 08/08/22 11:29 AM   Specimen: Anterior Nasal Swab  Result Value Ref Range Status   SARS Coronavirus 2 by RT PCR NEGATIVE NEGATIVE Final    Comment: (NOTE) SARS-CoV-2 target nucleic acids are NOT DETECTED.  The SARS-CoV-2 RNA is generally detectable in upper and lower respiratory specimens during the acute phase of infection. The lowest concentration of SARS-CoV-2 viral copies this assay can detect is 250 copies / mL. A negative result does not preclude SARS-CoV-2 infection and should not be used as the sole basis for treatment or other patient management decisions.  A negative result may occur with improper specimen collection / handling, submission of specimen other than nasopharyngeal swab, presence of viral mutation(s) within the areas targeted by this assay, and inadequate number of viral copies (<250 copies / mL). A negative result must be combined with clinical observations, patient history, and epidemiological information.  Fact Sheet for Patients:   https://www.patel.info/  Fact Sheet for Healthcare Providers: https://hall.com/  This test is not yet approved or  cleared by the [place] FDA and has been authorized for detection and/or diagnosis of SARS-CoV-2 by FDA under an Emergency Use Authorization (EUA).  This EUA will remain in effect (meaning this test can be used) for the duration of the COVID-19 declaration under Section 564(b)(1) of the Act,  21 U.S.C. section 360bbb-3(b)(1), unless the authorization is terminated or revoked sooner.  Performed at [facility], [place] [address] [place], [place] [address]   Body fluid culture w Gram Stain     Status: None (Preliminary result)   Collection Time: 08/10/22 10:10 AM   Specimen: PATH Cytology Pleural fluid  Result Value Ref Range Status   Specimen Description   Final    FLUID RIGHT PLEURAL Performed at [place] [address], [place], [place] [address]    Special Requests   Final    NONE Performed at [facility], [place] [address], [place], [place] [address]    Gram Stain NO WBC SEEN NO ORGANISMS SEEN   Final  Culture   Final    NO GROWTH < 24 HOURS Performed at [facility], [place] [address], [place], [place] [address]    Report Status PENDING  Incomplete         Radiology Studies: US THORACENTESIS ASP PLEURAL SPACE W/IMG GUIDE  Result Date: 08/10/2022 INDICATION: Diastolic heart failure with right pleural effusion. Request for diagnostic and therapeutic. EXAM: ULTRASOUND GUIDED RIGHT THORACENTESIS MEDICATIONS: 1% lidocaine 8 mL COMPLICATIONS: None immediate. PROCEDURE: An ultrasound guided thoracentesis was thoroughly discussed with the patient and questions answered. The benefits, risks, alternatives and complications were also discussed. The patient understands and wishes to proceed with the procedure. Written consent was obtained. Ultrasound was performed to localize and mark an adequate pocket of fluid in the right chest. The area was then prepped and draped in the normal sterile fashion. 1% Lidocaine was used for local anesthesia. Under ultrasound guidance a 6 Fr Safe-T-Centesis catheter was introduced. Thoracentesis was performed. The catheter was removed and a dressing applied. FINDINGS: A total of approximately 750 mL of clear yellow fluid was removed. Samples were sent to the laboratory as requested  by the clinical team. IMPRESSION: Successful ultrasound guided right thoracentesis yielding 750 mL of pleural fluid. No pneumothorax on post-procedure chest x-ray. Procedure performed by: Gareth Eagle, PA-C Electronically Signed   By: Aletta Edouard M.D.   On: 08/10/2022 12:46   DG CHEST PORT 1 VIEW  Result Date: 08/10/2022 CLINICAL DATA:  Post thoracentesis. EXAM: PORTABLE CHEST 1 VIEW COMPARISON:  August 08, 2022 and thoracentesis procedural images from August 10, 2022 FINDINGS: EKG leads project over the chest. Post median sternotomy for CABG and atrial appendage clipping. Cardiomediastinal contours and hilar structures are stable with cardiac enlargement. Stable appearance of LEFT-sided pleural effusion. Diminished graded opacity in the RIGHT chest compatible with diminished RIGHT-sided pleural effusion. No signs of RIGHT-sided pneumothorax. No new areas of airspace disease with RIGHT hilar fullness as on previous imaging. Added density in the RIGHT infrahilar region compatible with known masslike area. On limited assessment there is no acute skeletal process. IMPRESSION: 1. Diminished RIGHT-sided pleural effusion. No signs of pneumothorax. 2. Stable LEFT-sided pleural effusion. 3. RIGHT infrahilar masslike area as on previous imaging. Findings are highly concerning for neoplasm. Would also correlate with any ongoing signs of infection that could indicate pulmonary abscess. 4. Post median sternotomy for CABG and LEFT atrial clipping with signs of cardiomegaly as before. Electronically Signed   By: Zetta Bills M.D.   On: 08/10/2022 10:45   ECHOCARDIOGRAM COMPLETE  Result Date: 08/09/2022    ECHOCARDIOGRAM REPORT   Patient Name:   ARTHUR AYDELOTTE Date of Exam: 08/09/2022 Medical Rec #:  [id number]         Height:       60.0 in Accession #:    [id number]        Weight:       89.7 lb Date of Birth:  01-11-1924         BSA:          1.328 m Patient Age:    38 years          BP:           132/83 mmHg  Patient Gender: F                 HR:           101 bpm. Exam Location:  Inpatient Procedure: 2D Echo, 3D Echo, Cardiac Doppler and Color  Doppler Indications:    CHF-Acute Diastolic L46.50  History:        Patient has prior history of Echocardiogram examinations, most                 recent 11/27/2018. CHF, CAD, Prior CABG, Stroke, Carotid Disease                 and Pulmonary HTN, Arrythmias:Atrial Fibrillation,                 Signs/Symptoms:Chest Pain; Risk Factors:Hypertension.  Sonographer:    Darlina Sicilian RDCS Referring Phys: [id number] [place]  1. Left ventricular ejection fraction, by estimation, is 60 to 65%. Left ventricular ejection fraction by 3D volume is 65 %. The left ventricle has normal function. The left ventricle has no regional wall motion abnormalities. There is moderate asymmetric left ventricular hypertrophy of the basal-septal segment. Diastolic function indeterminant due to Afib. Elevated left atrial pressure.  2. Right ventricular systolic function is mildly reduced. The right ventricular size is mildly enlarged. There is normal pulmonary artery systolic pressure. The estimated right ventricular systolic pressure is 12.7 mmHg.  3. Left atrial size was severely dilated.  4. Right atrial size was severely dilated.  5. Moderate pleural effusion in both left and right lateral regions.  6. The mitral valve is degenerative. Mild to moderate mitral valve regurgitation.  7. Tricuspid valve regurgitation is mild to moderate.  8. The aortic valve is tricuspid. There is mild calcification of the aortic valve. There is mild thickening of the aortic valve. Aortic valve regurgitation is mild. Aortic valve sclerosis/calcification is present, without any evidence of aortic stenosis.  9. Aortic dilatation noted. There is borderline dilatation of the ascending aorta, measuring 38 mm. 10. The inferior vena cava is normal in size with greater than 50% respiratory variability, suggesting right  atrial pressure of 3 mmHg. Comparison(s): Compared to prior TTE in 2020, there are now bilateral pleural effusions. Otherwise, there is no significant change. FINDINGS  Left Ventricle: Left ventricular ejection fraction, by estimation, is 60 to 65%. Left ventricular ejection fraction by 3D volume is 65 %. The left ventricle has normal function. The left ventricle has no regional wall motion abnormalities. The left ventricular internal cavity size was normal in size. There is moderate asymmetric left ventricular hypertrophy of the basal-septal segment. Diastolic function indeterminant due to Afib. Elevated left atrial pressure. Right Ventricle: The right ventricular size is mildly enlarged. No increase in right ventricular wall thickness. Right ventricular systolic function is mildly reduced. There is normal pulmonary artery systolic pressure. The tricuspid regurgitant velocity  is 2.66 m/s, and with an assumed right atrial pressure of 3 mmHg, the estimated right ventricular systolic pressure is 51.7 mmHg. Left Atrium: Left atrial size was severely dilated. Right Atrium: Right atrial size was severely dilated. Pericardium: There is no evidence of pericardial effusion. Mitral Valve: The mitral valve is degenerative in appearance. There is mild thickening of the mitral valve leaflet(s). There is mild calcification of the mitral valve leaflet(s). Mild to moderate mitral annular calcification. Mild to moderate mitral valve regurgitation. Tricuspid Valve: The tricuspid valve is normal in structure. Tricuspid valve regurgitation is mild to moderate. Aortic Valve: The aortic valve is tricuspid. There is mild calcification of the aortic valve. There is mild thickening of the aortic valve. Aortic valve regurgitation is mild. Aortic regurgitation PHT measures 567 msec. Aortic valve sclerosis/calcification is present, without any evidence of aortic stenosis. Pulmonic Valve: The pulmonic valve was  normal in structure. Pulmonic  valve regurgitation is trivial. Aorta: Aortic dilatation noted. There is borderline dilatation of the ascending aorta, measuring 38 mm. Venous: The inferior vena cava is normal in size with greater than 50% respiratory variability, suggesting right atrial pressure of 3 mmHg. IAS/Shunts: The atrial septum is grossly normal. Additional Comments: There is a moderate pleural effusion in both left and right lateral regions.  LEFT VENTRICLE PLAX 2D LVIDd:         4.50 cm         Diastology LVIDs:         2.80 cm         LV e' medial:    3.85 cm/s LV PW:         1.00 cm         LV E/e' medial:  28.2 LV IVS:        0.90 cm         LV e' lateral:   7.69 cm/s                                LV E/e' lateral: 14.1                                 3D Volume EF                                LV 3D EF:    Left                                             ventricul                                             ar                                             ejection                                             fraction                                             by 3D                                             volume is                                             65 %.  3D Volume EF:                                3D EF:        65 % RIGHT VENTRICLE RV Basal diam:  3.70 cm RV Mid diam:    2.30 cm RV S prime:     7.42 cm/s LEFT ATRIUM             Index        RIGHT ATRIUM           Index LA diam:        4.30 cm 3.24 cm/m   RA Area:     14.90 cm LA Vol (A2C):   56.0 ml 42.18 ml/m  RA Volume:   32.50 ml  24.48 ml/m LA Vol (A4C):   77.6 ml 58.48 ml/m LA Biplane Vol: 72.9 ml 54.91 ml/m  AORTIC VALVE LVOT Vmax:   96.70 cm/s LVOT Vmean:  56.000 cm/s LVOT VTI:    0.160 m AI PHT:      567 msec  AORTA Ao Root diam: 2.50 cm Ao Asc diam:  3.80 cm MITRAL VALVE                  TRICUSPID VALVE MV Area (PHT): 5.39 cm       TR Peak grad:   28.3 mmHg MV Decel Time: 141 msec       TR Vmax:        266.00 cm/s MR Peak  grad:    90.6 mmHg MR Mean grad:    56.0 mmHg    SHUNTS MR Vmax:         476.00 cm/s  Systemic VTI: 0.16 m MR Vmean:        351.0 cm/s MR PISA:         1.01 cm MR PISA Eff ROA: 8 mm MR PISA Radius:  0.40 cm MV E velocity: 108.67 cm/s Gwyndolyn Kaufman MD Electronically signed by Gwyndolyn Kaufman MD Signature Date/Time: 08/09/2022/3:26:12 PM    Final         Scheduled Meds:  acidophilus  1 capsule Oral Daily   amiodarone  200 mg Oral QHS   apixaban  2.5 mg Oral BID   Chlorhexidine Gluconate Cloth  6 each Topical Daily   cholecalciferol  400 Units Oral Daily   diltiazem  120 mg Oral Daily   docusate sodium  100 mg Oral QHS   doxycycline  100 mg Oral Q12H   ferrous sulfate  325 mg Oral Q breakfast   furosemide  40 mg Intravenous Daily   lactose free nutrition  237 mL Oral TID WC   levothyroxine  25 mcg Oral Q0600   melatonin  5 mg Oral QHS   polyethylene glycol  17 g Oral Daily   polyvinyl alcohol  1 drop Both Eyes QHS   sertraline  50 mg Oral q morning   sodium chloride  1 g Oral TID   Continuous Infusions:  cefTRIAXone (ROCEPHIN)  IV Stopped (08/10/22 1328)     LOS: 3 days    Time spent: 35 minutes    Barb Merino, MD [facility] Pager [phone]

## 2022-08-11 NOTE — Progress Notes (Signed)
Occupational Therapy Treatment Patient Details Name: Maria [name] MRN: [id number] DOB: [birth year] Today's Date: 08/11/2022   History of present illness Pt is a 88 y/o F admitted on 08/08/22 after presenting to the ED with SOB & hypoxemia. Of note, pt was admitted 2 months ago 2/2 traumatic pelvic & L wrist fx & was d/c to SNF. Since then pt has had worsening SOB & was diagnosed with pleural effusion, R lower lobe tumor & was scheduled for PET scan on 10/18. Pleural effusion was negative for malignancy. Pt is being treated for acute hypoxemic respiratory failure, B pleural effusion, transudate on recent thoracentesis, recurrent R>L, and R lower lobe tumor increasing & cavitating. PMH: paroxysmal a-fib on eliquis, stroke, HTN, chronic diastolic heart failure, chronic T10, L1, L2 fxs   OT comments  She required mod assist for supine to sit & to stand using a RW. Patient presented with 1 significant loss of balance in standing, requiring mod assist to correct & to help her sit down suddenly; she reported "I feel unsteady."  She performed upper body dressing seated at chair level. She was noted to also be with generalized weakness and deconditioning. She will benefit from further OT services to maximize her ADL performance and to decrease the risk for progressive weakness.    Recommendations for follow up therapy are one component of a multi-disciplinary discharge planning process, led by the attending physician.  Recommendations may be updated based on patient status, additional functional criteria and insurance authorization.    Follow Up Recommendations  Skilled nursing-short term rehab (<3 hours/day)    Assistance Recommended at Discharge Frequent or constant Supervision/Assistance  Patient can return home with the following  A lot of help with walking and/or transfers;A lot of help with bathing/dressing/bathroom;Assistance with cooking/housework;Direct supervision/assist for financial  management;Assist for transportation;Direct supervision/assist for medications management   Equipment Recommendations  None recommended by OT    Recommendations for Other Services      Precautions / Restrictions Precautions Precautions: Fall Precaution Comments: wrist brace L wrist, WBAT LUE per secure chat with Dr.Landau Restrictions Weight Bearing Restrictions: Yes LUE Weight Bearing: Weight bearing as tolerated       Mobility Bed Mobility Overal bed mobility: Needs Assistance Bed Mobility: Supine to Sit     Supine to sit: Mod assist, HOB elevated     General bed mobility comments: required increased effort and general cues for advancing B LE and reaching for bed rail    Transfers Overall transfer level: Needs assistance Equipment used: Rolling walker (2 wheels) Transfers: Sit to/from Stand Sit to Stand: Mod assist           General transfer comment: required cues for hand placement and upright posture in standing     Balance                                           ADL either performed or assessed with clinical judgement   ADL Overall ADL's : Needs assistance/impaired Eating/Feeding: Set up;Sitting   Grooming: Set up;Sitting           Upper Body Dressing : Minimal assistance;Sitting Upper Body Dressing Details (indicate cue type and reason): She doffed then donned a hospital gown seated in the chair. Lower Body Dressing: Maximal assistance  Extremity/Trunk Assessment              Vision       Perception     Praxis      Cognition Arousal/Alertness: Awake/alert Behavior During Therapy: WFL for tasks assessed/performed                                            Exercises      Shoulder Instructions       General Comments      Pertinent Vitals/ Pain       Pain Assessment Pain Assessment: No/denies pain  Home Living                                           Prior Functioning/Environment              Frequency  Min 2X/week        Progress Toward Goals  OT Goals(current goals can now be found in the care plan section)  Progress towards OT goals: Progressing toward goals  Acute Rehab OT Goals Patient Stated Goal: to get better OT Goal Formulation: With patient Time For Goal Achievement: 08/24/22 Potential to Achieve Goals: [place] Discharge plan remains appropriate    Co-evaluation                 AM-PAC OT "6 Clicks" Daily Activity     Outcome Measure   Help from another person eating meals?: None Help from another person taking care of personal grooming?: A Little Help from another person toileting, which includes using toliet, bedpan, or urinal?: A Lot Help from another person bathing (including washing, rinsing, drying)?: A Lot Help from another person to put on and taking off regular upper body clothing?: A Little Help from another person to put on and taking off regular lower body clothing?: A Lot 6 Click Score: 16    End of Session Equipment Utilized During Treatment: Gait belt;Rolling walker (2 wheels)  OT Visit Diagnosis: Unsteadiness on feet (R26.81);Other abnormalities of gait and mobility (R26.89);Muscle weakness (generalized) (M62.81)   Activity Tolerance  (Fair+ tolerance)   Patient Left in chair;with call bell/phone within reach;with family/visitor present   Nurse Communication Mobility status        Time: 8416-6063 OT Time Calculation (min): 22 min  Charges: OT General Charges $OT Visit: 1 Visit OT Treatments $Therapeutic Activity: 8-22 mins   Leota Sauers, OTR/L 08/11/2022, 4:58 PM

## 2022-08-12 DIAGNOSIS — Z7189 Other specified counseling: Secondary | ICD-10-CM | POA: Diagnosis not present

## 2022-08-12 DIAGNOSIS — I5033 Acute on chronic diastolic (congestive) heart failure: Secondary | ICD-10-CM | POA: Diagnosis not present

## 2022-08-12 DIAGNOSIS — E871 Hypo-osmolality and hyponatremia: Secondary | ICD-10-CM | POA: Diagnosis not present

## 2022-08-12 DIAGNOSIS — R918 Other nonspecific abnormal finding of lung field: Secondary | ICD-10-CM | POA: Diagnosis not present

## 2022-08-12 DIAGNOSIS — C189 Malignant neoplasm of colon, unspecified: Secondary | ICD-10-CM

## 2022-08-12 DIAGNOSIS — R0602 Shortness of breath: Secondary | ICD-10-CM

## 2022-08-12 DIAGNOSIS — K6389 Other specified diseases of intestine: Secondary | ICD-10-CM

## 2022-08-12 LAB — CYTOLOGY - NON PAP

## 2022-08-12 LAB — CBC
HCT: 29.7 % — ABNORMAL LOW (ref 36.0–46.0)
Hemoglobin: 9.8 g/dL — ABNORMAL LOW (ref 12.0–15.0)
MCH: 29.5 pg (ref 26.0–34.0)
MCHC: 33 g/dL (ref 30.0–36.0)
MCV: 89.5 fL (ref 80.0–100.0)
Platelets: 406 10*3/uL — ABNORMAL HIGH (ref 150–400)
RBC: 3.32 MIL/uL — ABNORMAL LOW (ref 3.87–5.11)
RDW: 15.9 % — ABNORMAL HIGH (ref 11.5–15.5)
WBC: 6.6 10*3/uL (ref 4.0–10.5)
nRBC: 0 % (ref 0.0–0.2)

## 2022-08-12 MED ORDER — POTASSIUM CHLORIDE CRYS ER 20 MEQ PO TBCR
40.0000 meq | EXTENDED_RELEASE_TABLET | Freq: Every day | ORAL | Status: DC
  Administered 2022-08-12: 40 meq via ORAL
  Filled 2022-08-12: qty 2

## 2022-08-12 MED ORDER — IPRATROPIUM-ALBUTEROL 0.5-2.5 (3) MG/3ML IN SOLN: 3.0000 mL | Freq: Four times a day (QID) | RESPIRATORY_TRACT | 0 refills | Status: AC | PRN

## 2022-08-12 MED ORDER — FUROSEMIDE 40 MG PO TABS: 80.0000 mg | ORAL_TABLET | Freq: Every day | ORAL | Status: DC

## 2022-08-12 MED ORDER — TRAMADOL HCL 50 MG PO TABS: 50.0000 mg | ORAL_TABLET | Freq: Two times a day (BID) | ORAL | 0 refills | Status: AC | PRN

## 2022-08-12 MED ORDER — BENZONATATE 100 MG PO CAPS: 100.0000 mg | ORAL_CAPSULE | Freq: Three times a day (TID) | ORAL | 0 refills | Status: AC | PRN

## 2022-08-12 MED ORDER — POTASSIUM CHLORIDE CRYS ER 20 MEQ PO TBCR: 40.0000 meq | EXTENDED_RELEASE_TABLET | Freq: Every day | ORAL | 0 refills | Status: AC

## 2022-08-12 MED ORDER — DOXYCYCLINE HYCLATE 100 MG PO TABS: 100.0000 mg | ORAL_TABLET | Freq: Two times a day (BID) | ORAL | 0 refills | Status: AC

## 2022-08-12 MED ORDER — GUAIFENESIN 100 MG/5ML PO LIQD: 5.0000 mL | ORAL | 0 refills | Status: AC | PRN

## 2022-08-12 NOTE — Progress Notes (Signed)
PROGRESS NOTE    Maria [name]  OZD:[id number] DOB: 1924-09-23 DOA: 08/08/2022 PCP: [name], MD    Brief Narrative:  88 year old with history of paroxysmal A-fib on Eliquis, history of stroke, hypertension, chronic diastolic heart failure presented to the emergency room with shortness of breath and hypoxemia from skilled nursing facility.  Patient was admitted to the hospital 2 months ago with traumatic pelvic fracture and left wrist fracture and discharged to a SNF.  Since then she had worsening shortness of breath and was diagnosed with pleural effusion, right lower lobe tumor and was scheduled for PET scan on 10/18 by Pulmonary.  Pleural fluid was negative for malignancy.  Brought to the ER with persistent symptoms.  She is coughing and nauseated with cough.  COVID-19 negative.  She was on doxycycline before coming to the ER.  PET scan done as inpatient, that indicates possible ascending colon cancer with local mets, solitary mets lesion in the liver.  Right lower lobe tumor likely cancer because of other metabolically active lymph nodes and intra thoracic region and also in the mesenteric.  Remains debilitated but motivated to rehab.   Assessment & Plan:   Acute hypoxemic respiratory failure, presentation oxygen 83% on room air and shortness of breath corrected with supplemental oxygen. Bilateral pleural effusion, transudate on recent thoracentesis, recurrent right more than left. Right lower lobe tumor, increasing and cavitating on repeat CT scan.  Patient has groundglass opacity with COVID-19 test was negative. Sputum cultures negative so far.  Blood cultures negative so far.  Thoracentesis 9/28, transudate and no growth.  No malignant cells.   Repeat thoracentesis in this hospitalization, transudate and no growth.  No malignant cells. On doxycycline, completed 7 days of therapy.  Added Rocephin and will complete 5 days of therapy today. See goal of care discussion  below. We will continue with chest physiotherapy, mucolytic's and bronchodilators.  Mobility. Supplemental oxygen to keep saturation more than 90%.  Paroxysmal A-fib, currently sinus rhythm on amiodarone and Cardizem.  Eliquis resumed and tolerated.  Chronic diastolic congestive heart failure, unlikely contributing.  Treated with IV diuretics.  Will change to oral Lasix.  Debility with recent pelvic fracture and left wrist fracture, work with PT OT.  May still need more rehab.  Hypothyroidism, on Synthroid.  Resumed.  Hyponatremia, chronic and persistent.  Chronic indwelling Foley catheter secondary to immobility and recent pelvic fracture, failed voiding trial.  Continue catheter care until improvement of mobility.  Goals of care: Multiple discussions with patient, multiple family members and patient's daughters. Patient is well aware about her diagnosis. Liberalize diet to regular diet and take of restrictions. Patient and family agreed to continue attempt rehab, palliative follow-up and hospice in the future. Even though have diagnosis of possible metastatic cancer, with her worsening frailty and debility, she will benefit with supportive treatment but no cancer therapy.  Attempting biopsies may cause more complications and patient and family agreeable.    DVT prophylaxis: apixaban (ELIQUIS) tablet 2.5 mg Start: 08/10/22 1400   apixaban (ELIQUIS) tablet 2.5 mg   Code Status: DNR/DNI Family Communication: multiple family members at the bedside.  Disposition Plan: Status is: Inpatient Remains inpatient appropriate because: Needs rehab.     Consultants:  PCCM, curbside Palliative care , consulted  Procedures:  Thoracentesis, 750 mL right side  Antimicrobials:  Rocephin and doxycycline 10/16----   Subjective:  Patient seen and examined.  Granddaughter at the bedside.  Patient tells me she is feeling about the same.  Has cough,  however better clearing than  before. Patient was asking me different questions.  She was asking me whether she can go back to [facility] and ultimately get into hospice program.  She wants to see if she can still do some rehab with physical therapy.  Objective: Vitals:   08/11/22 1316 08/11/22 2214 08/11/22 2255 08/12/22 0515  BP: (!) 122/53 133/87  (!) 121/58  Pulse: 68 91  79  Resp: 20 (!) 26 (!) 21 (!) 24  Temp: 97.9 F (36.6 C) 98.1 F (36.7 C)  97.8 F (36.6 C)  TempSrc: Oral Oral  Oral  SpO2: 97% 95%  97%  Weight:      Height:        Intake/Output Summary (Last 24 hours) at 08/12/2022 1135 Last data filed at 08/12/2022 0300 Gross per 24 hour  Intake 591.5 ml  Output 1850 ml  Net -1258.5 ml   Filed Weights   08/08/22 0144 08/09/22 0649  Weight: 42.6 kg 40.7 kg    Examination:  General exam: Frail and debilitated, chronically sick looking but not in any distress today.  Able to talk in complete sentences. Respiratory system: Poor bilateral air entry.  No added sounds. SpO2: 97 % O2 Flow Rate (L/min): 3 L/min  Cardiovascular system: S1 & S2 heard, RRR. No JVD, murmurs, rubs, gallops or clicks. No pedal edema.  Gastrointestinal system: Nontender.   Central nervous system: Alert, awake and oriented. No focal neurological deficits. Thin and cachectic.    Data Reviewed: I have personally reviewed following labs and imaging studies  CBC: Recent Labs  Lab 08/08/22 0200 08/09/22 0318 08/12/22 0357  WBC 8.4 8.0 6.6  NEUTROABS 7.2  --   --   HGB 10.6* 10.4* 9.8*  HCT 31.3* 31.1* 29.7*  MCV 89.4 88.6 89.5  PLT 491* 455* 193*   Basic Metabolic Panel: Recent Labs  Lab 08/08/22 0200 08/08/22 0953 08/09/22 0318  NA 127* 127* 129*  K 2.9* 3.3* 3.4*  CL 95* 95* 96*  CO2 26 23 27   GLUCOSE 118* 98 100*  BUN 9 9 9   CREATININE 0.42* 0.54 0.36*  CALCIUM 7.9* 7.9* 8.1*  MG  --  1.9  --   PHOS  --  3.0  --    GFR: Estimated Creatinine Clearance: 26.4 mL/min (A) (by C-G formula based on  SCr of 0.36 mg/dL (L)). Liver Function Tests: Recent Labs  Lab 08/08/22 0953 08/09/22 0318  AST  --  101*  ALT  --  56*  ALKPHOS  --  98  BILITOT  --  0.5  PROT  --  6.0*  ALBUMIN 2.5* 2.3*   No results for input(s): "LIPASE", "AMYLASE" in the last 168 hours. No results for input(s): "AMMONIA" in the last 168 hours. Coagulation Profile: No results for input(s): "INR", "PROTIME" in the last 168 hours. Cardiac Enzymes: No results for input(s): "CKTOTAL", "CKMB", "CKMBINDEX", "TROPONINI" in the last 168 hours. BNP (last 3 results) Recent Labs    10/02/21 1529  PROBNP 1,013*   HbA1C: No results for input(s): "HGBA1C" in the last 72 hours. CBG: Recent Labs  Lab 08/11/22 1121  GLUCAP 90   Lipid Profile: No results for input(s): "CHOL", "HDL", "LDLCALC", "TRIG", "CHOLHDL", "LDLDIRECT" in the last 72 hours. Thyroid Function Tests: No results for input(s): "TSH", "T4TOTAL", "FREET4", "T3FREE", "THYROIDAB" in the last 72 hours. Anemia Panel: No results for input(s): "VITAMINB12", "FOLATE", "FERRITIN", "TIBC", "IRON", "RETICCTPCT" in the last 72 hours. Sepsis Labs: No results for input(s): "PROCALCITON", "LATICACIDVEN"  in the last 168 hours.  Recent Results (from the past 240 hour(s))  Blood culture (routine x 2)     Status: None (Preliminary result)   Collection Time: 08/08/22  2:00 AM   Specimen: Left Antecubital; Blood  Result Value Ref Range Status   Specimen Description   Final    LEFT ANTECUBITAL BLOOD Performed at [facility], [address] [place], [place] [address]    Special Requests   Final    BOTTLES DRAWN AEROBIC AND ANAEROBIC Blood Culture results may not be optimal due to an excessive volume of blood received in culture bottles Performed at [facility] [address], [place], [place] [address]    Culture   Final    NO GROWTH 4 DAYS Performed at [facility], [place] [address] [place], [place] [address]    Report Status  PENDING  Incomplete  Blood culture (routine x 2)     Status: None (Preliminary result)   Collection Time: 08/08/22  2:05 AM   Specimen: Right Antecubital; Blood  Result Value Ref Range Status   Specimen Description   Final    RIGHT ANTECUBITAL BLOOD Performed at [facility], [place] [address], [place], [place] [address]    Special Requests   Final    BOTTLES DRAWN AEROBIC AND ANAEROBIC Blood Culture results may not be optimal due to an excessive volume of blood received in culture bottles Performed at [facility] [address], [place], [place] [address]    Culture   Final    NO GROWTH 4 DAYS Performed at [facility], [place] [address], [place], [place] [address]    Report Status PENDING  Incomplete  SARS Coronavirus 2 by RT PCR (hospital order, performed in [facility] hospital lab) *cepheid single result test* Anterior Nasal Swab     Status: None   Collection Time: 08/08/22 11:29 AM   Specimen: Anterior Nasal Swab  Result Value Ref Range Status   SARS Coronavirus 2 by RT PCR NEGATIVE NEGATIVE Final    Comment: (NOTE) SARS-CoV-2 target nucleic acids are NOT DETECTED.  The SARS-CoV-2 RNA is generally detectable in upper and lower respiratory specimens during the acute phase of infection. The lowest concentration of SARS-CoV-2 viral copies this assay can detect is 250 copies / mL. A negative result does not preclude SARS-CoV-2 infection and should not be used as the sole basis for treatment or other patient management decisions.  A negative result may occur with improper specimen collection / handling, submission of specimen other than nasopharyngeal swab, presence of viral mutation(s) within the areas targeted by this assay, and inadequate number of viral copies (<250 copies / mL). A negative result must be combined with clinical observations, patient history, and epidemiological information.  Fact Sheet for Patients:    https://www.patel.info/  Fact Sheet for Healthcare Providers: https://hall.com/  This test is not yet approved or  cleared by the [place] FDA and has been authorized for detection and/or diagnosis of SARS-CoV-2 by FDA under an Emergency Use Authorization (EUA).  This EUA will remain in effect (meaning this test can be used) for the duration of the COVID-19 declaration under Section 564(b)(1) of the Act, 21 U.S.C. section 360bbb-3(b)(1), unless the authorization is terminated or revoked sooner.  Performed at [facility], [place] [address], [place], [place] [address]   Body fluid culture w Gram Stain     Status: None (Preliminary result)  Collection Time: 08/10/22 10:10 AM   Specimen: PATH Cytology Pleural fluid  Result Value Ref Range Status   Specimen Description   Final    FLUID RIGHT PLEURAL Performed at [facility] [address] [place], [place] [address]    Special Requests   Final    NONE Performed at [facility], [place] [address], [place], [place] [address]    Gram Stain NO WBC SEEN NO ORGANISMS SEEN   Final   Culture   Final    NO GROWTH < 24 HOURS Performed at [facility], [place] [address], [place], [place] [address]    Report Status PENDING  Incomplete         Radiology Studies: NM PET Image Initial (PI) Skull Base To Thigh (F-18 FDG)  Result Date: 08/11/2022 CLINICAL DATA:  Initial treatment strategy for pulmonary nodules. EXAM: NUCLEAR MEDICINE PET SKULL BASE TO THIGH TECHNIQUE: 5.0 mCi F-18 FDG was injected intravenously. Full-ring PET imaging was performed from the skull base to thigh after the radiotracer. CT data was obtained and used for attenuation correction and anatomic localization. Fasting blood glucose: 90 mg/dl COMPARISON:  Chest CT 08/09/2022 FINDINGS: Mediastinal blood pool activity: SUV max 1.5 Liver activity: SUV max NA NECK: No  significant abnormal hypermetabolic activity in this region. Incidental CT findings: Mild chronic left maxillary sinusitis. Bilateral common carotid atherosclerotic calcification. CHEST: Indistinct and likely cavitary lesion in the right lower lobe with a small amount of internal gas density, maximum SUV 9.1, region of involvement measures about 5.8 by 4.5 cm. Bilateral hypermetabolic ground-glass opacities and airspace opacities in both lungs. These are not substantially non nodular but are notably hypermetabolic. A 3.3 by 2.3 cm sub solid nodule anteriorly in the right upper lobe on image 48 series 4 has maximum SUV of 9.6. Hypermetabolic lower paratracheal lymph node anterior to the carina, 1.9 cm in short axis on image 60 series 4, maximum SUV 5.7. Small hypermetabolic bilateral supraclavicular nodes including a 0.5 cm in short axis right supraclavicular node on image 43 series 4 with maximum SUV 5.7. Incidental CT findings: Moderate bilateral pleural effusions. Airway thickening bilaterally. Moderate cardiomegaly. Low-density blood pool suggests anemia. Coronary, aortic arch, and branch vessel atherosclerotic vascular disease. Left atrial appendage clamp noted. ABDOMEN/PELVIS: Hypermetabolic mass within or less likely along the ascending colon, about 2.6 cm in diameter, maximum SUV 10.7. Is hypermetabolic mesenteric adenopathy, index 1.0 cm central mesenteric node on image 121 series 4 has a maximum SUV of 7.6. Other hypermetabolic mesenteric lymph nodes are also present. 1.0 cm inferior right hepatic lobe lesion on image 106 series 4 has maximum SUV of 6.3, compatible with metastatic lesion. Questionable tiny foci of accentuated metabolic activity along the liver capsule particularly involving the right hepatic lobe. Incidental CT findings: Hyperdense liver favoring hemochromatosis. Prominent atherosclerotic vascular disease including the abdominal aorta and superior mesenteric artery. Hazy subcutaneous and  mesenteric edema. Sigmoid colon diverticulosis. SKELETON: No significant abnormal hypermetabolic activity in this region. Incidental CT findings: Right hip arthroplasty. Cannulated left hip screws. Healing fractures of the left sacral ala, and left pubic rami. Levoconvex lumbar scoliosis with rotary component. IMPRESSION: 1. Hypermetabolic ascending colon mass with hypermetabolic central mesenteric adenopathy and a solitary hepatic parenchymal metastatic lesion. Appearance favors colon cancer. 2. There also some scattered tiny foci of accentuated metabolic activity along the liver capsule, peritoneal spread of tumor not excluded. 3. 5.8 cm cavitary lesion in the right lower lobe with a small amount of  internal gas and hypermetabolic activity up to SUV 9.1, possibilities include atypical infection or cavitary malignancy. There is also hypermetabolic paratracheal and supraclavicular adenopathy. 4. Bilateral hypermetabolic ground-glass opacities and airspace opacities in both lungs, peripheral distribution, probably from some type of atypical pneumonia. Given the peripheral opacities, consider COVID testing if not already performed. 5. Healing left pelvic fractures. 6. Moderate bilateral pleural effusions with mesenteric and subcutaneous edema compatible with third spacing of fluid. 7. Moderate cardiomegaly.  Low-density blood pool suggests anemia. 8. Aortic Atherosclerosis (ICD10-I70.0). Coronary and systemic atherosclerosis. 9. Levoconvex lumbar scoliosis with rotary component. 10. Sigmoid colon diverticulosis. 11. Hyperdense liver favoring hemochromatosis. Electronically Signed   By: Van Clines M.D.   On: 08/11/2022 13:49        Scheduled Meds:  acidophilus  1 capsule Oral Daily   amiodarone  200 mg Oral QHS   apixaban  2.5 mg Oral BID   Chlorhexidine Gluconate Cloth  6 each Topical Daily   cholecalciferol  400 Units Oral Daily   diltiazem  120 mg Oral Daily   docusate sodium  100 mg Oral QHS    ferrous sulfate  325 mg Oral Q breakfast   [START ON 08/13/2022] furosemide  80 mg Oral Daily   lactose free nutrition  237 mL Oral TID WC   levothyroxine  25 mcg Oral Q0600   melatonin  5 mg Oral QHS   polyethylene glycol  17 g Oral Daily   polyvinyl alcohol  1 drop Both Eyes QHS   potassium chloride  40 mEq Oral Daily   sertraline  50 mg Oral q morning   sodium chloride  1 g Oral TID   Continuous Infusions:  cefTRIAXone (ROCEPHIN)  IV Stopped (08/11/22 1357)     LOS: 4 days    Time spent: 35 minutes    Barb Merino, MD [facility] Pager [phone]

## 2022-08-12 NOTE — TOC Progression Note (Addendum)
Transition of Care [facility]) - Progression Note    Patient Details  Name: Maria [name] MRN: [id number] Date of Birth: 09/07/1924  [facility]) CM/SW Contact  Leeroy Cha, RN Phone Number: 08/12/2022, 10:18 AM  Clinical Narrative:    Fl2 note sent to [facility] for future return to facility.        Expected Discharge Plan and Services                                                 Social Determinants of Health (SDOH) Interventions    Readmission Risk Interventions   No data to display

## 2022-08-12 NOTE — Progress Notes (Signed)
PT Cancellation Note  Patient Details Name: Maria [name] MRN: [id number] DOB: [birth year]   Cancelled Treatment:    Reason Eval/Treat Not Completed: Patient at procedure or test/unavailable. Provider meeting with family.  Will check back as schedule permits.   Galen Manila 08/12/2022, 1:41 PM

## 2022-08-12 NOTE — TOC Progression Note (Addendum)
Transition of Care [facility]) - Progression Note    Patient Details  Name: Maria [name] MRN: [id number] Date of Birth: 11-06-1923  Transition of Care [facility]) CM/SW Contact  Leeroy Cha, RN Phone Number: 08/12/2022, 1:36 PM  Clinical Narrative:    Tct-drew at [facility], pt may return today to room 304. Number to call for report is [phone]. Message sent to [facility] woody with [facility] that patient is interested in palliative care.  Delivered and accepted.  1414-ptar called for transport./ packet to the unit clerk on [place].      Expected Discharge Plan and Services                                                 Social Determinants of Health (SDOH) Interventions    Readmission Risk Interventions   No data to display

## 2022-08-12 NOTE — Progress Notes (Signed)
[facility])  Referral received for outpatient palliative care at [facility] once discharged.  [facility] will f/u with Maria [name] and her family.  Thank you, Venia Carbon DNP, RN [facility] Liaison

## 2022-08-12 NOTE — NC FL2 (Signed)
[place] LEVEL OF CARE SCREENING TOOL     IDENTIFICATION  Patient Name: Maria [name] Birthdate: 1924/08/02 Sex: female Admission Date (Current Location): 08/08/2022  [facility] and [place] Number:  Herbalist and Address:  [facility],  [place] [address] [place]      Provider Number: [id number]  Attending Physician Name and Address:  Barb Merino, MD  Relative Name and Phone Number:       Current Level of Care: Hospital Recommended Level of Care: [place] Prior Approval Number:    Date Approved/Denied:   PASRR Number: [id number]  Discharge Plan: SNF    Current Diagnoses: Patient Active Problem List   Diagnosis Date Noted   Acute hypoxemic respiratory failure ([place]) 08/08/2022   Shortness of breath 08/08/2022   Hypokalemia 08/08/2022   Pleural effusion    Right lower lobe lung mass 07/19/2022   Pleural effusion, right 07/19/2022   Elevated LFTs 06/17/2022   Abnormal CXR 06/16/2022   Acute urinary retention 06/15/2022   AV block, 1st degree 06/14/2022   Constipation 06/14/2022   Closed fracture of multiple pubic rami, left, initial encounter ([place]) 06/12/2022   Hypoxia 06/12/2022   Fracture of distal end of left radius 06/10/2022   Fracture of multiple pubic rami, left, closed, initial encounter ([place]) 06/10/2022   Malnutrition of moderate degree 04/04/2021   Femur fracture, left ([place]) 04/02/2021   Fracture    Closed left hip fracture, initial encounter ([place]) 03/28/2021   Celiac disease 03/28/2021   DNR (do not resuscitate) 03/28/2021   Left hand weakness 11/29/2018   Dehydration with hyponatremia 11/29/2018   CVA (cerebral vascular accident) ([place]) 11/26/2018   Chest pain 11/26/2018   On amiodarone therapy 07/13/2017   Frailty 07/13/2017   CKD (chronic kidney disease), stage III ([place])    Hypothyroidism 10/18/2014   Chronic anticoagulation CHADS2VASC2 score 7 10/18/2014   Acute on chronic  diastolic CHF (congestive heart failure) ([place]) 10/18/2014   Right leg pain 07/22/2014   Gout of foot 04/10/2014   Atonic bladder 12/11/2013   Hyponatremia 12/08/2013   Closed right hip fracture ([place]) 12/07/2013   Protein-calorie malnutrition, severe ([place]) 10/04/2013   DVT (deep venous thrombosis) ([place]) 10/03/2013   Coronary artery disease involving coronary bypass graft of native heart with angina pectoris ([place]) 09/12/2013   Mitral regurgitation 08/07/2013   Pulmonary hypertension, moderate to severe ([place]) 08/07/2013   Essential hypertension, benign 08/02/2013   Nontoxic uninodular goiter 08/02/2013   Unspecified hereditary and idiopathic peripheral neuropathy 08/02/2013   Carotid disease, bilateral ([place]) 08/02/2013   Unspecified vitamin D deficiency 08/02/2013   Depression 08/02/2013   PAF (paroxysmal atrial fibrillation) ([place]) 08/02/2013   Trifascicular block 08/02/2013   Gastric ulcer, unspecified as acute or chronic, without mention of hemorrhage, perforation, or obstruction 08/02/2013   Sinoatrial node dysfunction ([place]) 08/02/2013    Orientation RESPIRATION BLADDER Height & Weight     Self, Time, Situation, Place  Normal Continent Weight: 40.7 kg Height:  5' (152.4 cm)  BEHAVIORAL SYMPTOMS/MOOD NEUROLOGICAL BOWEL NUTRITION STATUS      Continent Diet (regular)  AMBULATORY STATUS COMMUNICATION OF NEEDS Skin   Extensive Assist Verbally                         Personal Care Assistance Level of Assistance  Bathing, Feeding, Dressing Bathing Assistance: Limited assistance Feeding assistance: Limited assistance Dressing Assistance: Limited assistance     Functional Limitations Info  Sight,  Hearing, Speech Sight Info: Adequate Hearing Info: Adequate Speech Info: Adequate    SPECIAL CARE FACTORS FREQUENCY  PT (By licensed PT), OT (By licensed OT)     PT Frequency: 5 x weekly OT Frequency: 5 x weekly            Contractures Contractures Info: Not present     Additional Factors Info  Code Status Code Status Info: DNR             Current Medications (08/12/2022):  This is the current hospital active medication list Current Facility-Administered Medications  Medication Dose Route Frequency Provider Last Rate Last Admin   acetaminophen (TYLENOL) tablet 650 mg  650 mg Oral Q6H PRN Marylyn Ishihara, Tyrone A, DO   650 mg at 08/10/22 7026   Or   acetaminophen (TYLENOL) suppository 650 mg  650 mg Rectal Q6H PRN Marylyn Ishihara, Tyrone A, DO       acidophilus (RISAQUAD) capsule 1 capsule  1 capsule Oral Daily Kyle, Tyrone A, DO   1 capsule at 08/12/22 0931   amiodarone (PACERONE) tablet 200 mg  200 mg Oral QHS Kyle, Tyrone A, DO   200 mg at 08/11/22 2140   apixaban (ELIQUIS) tablet 2.5 mg  2.5 mg Oral BID Barb Merino, MD   2.5 mg at 08/12/22 0930   cefTRIAXone (ROCEPHIN) 2 g in sodium chloride 0.9 % 100 mL IVPB  2 g Intravenous Q24H Barb Merino, MD   Stopped at 08/11/22 1357   Chlorhexidine Gluconate Cloth 2 % PADS 6 each  6 each Topical Daily Barb Merino, MD   6 each at 08/12/22 0932   cholecalciferol (VITAMIN D3) 10 MCG (400 UNIT) tablet 400 Units  400 Units Oral Daily Marylyn Ishihara, Tyrone A, DO   400 Units at 08/12/22 0931   diltiazem (CARDIZEM CD) 24 hr capsule 120 mg  120 mg Oral Daily Kyle, Tyrone A, DO   120 mg at 08/12/22 0930   docusate sodium (COLACE) capsule 100 mg  100 mg Oral QHS Kyle, Tyrone A, DO   100 mg at 08/11/22 2140   ferrous sulfate tablet 325 mg  325 mg Oral Q breakfast Marylyn Ishihara, Tyrone A, DO   325 mg at 08/11/22 1325   furosemide (LASIX) injection 40 mg  40 mg Intravenous Daily Kyle, Tyrone A, DO   40 mg at 08/12/22 0931   guaiFENesin (ROBITUSSIN) 100 MG/5ML liquid 5 mL  5 mL Oral Q4H PRN Marylyn Ishihara, Tyrone A, DO   5 mL at 08/12/22 3785   ipratropium-albuterol (DUONEB) 0.5-2.5 (3) MG/3ML nebulizer solution 3 mL  3 mL Nebulization Q4H PRN Marylyn Ishihara, Tyrone A, DO       lactose free nutrition (BOOST PLUS) liquid 237 mL  237 mL Oral TID WC Kyle, Tyrone A, DO   237  mL at 08/12/22 0823   levothyroxine (SYNTHROID) tablet 25 mcg  25 mcg Oral Q0600 Marylyn Ishihara, Tyrone A, DO   25 mcg at 08/12/22 0636   melatonin tablet 5 mg  5 mg Oral QHS Kyle, Tyrone A, DO   5 mg at 08/11/22 2218   ondansetron (ZOFRAN) tablet 4 mg  4 mg Oral Q6H PRN Cherylann Ratel A, DO       Or   ondansetron (ZOFRAN) injection 4 mg  4 mg Intravenous Q6H PRN Marylyn Ishihara, Tyrone A, DO   4 mg at 08/10/22 0839   polyethylene glycol (MIRALAX / GLYCOLAX) packet 17 g  17 g Oral Daily Kyle, Tyrone A, DO   17 g at 08/12/22 [phone]  polyvinyl alcohol (LIQUIFILM TEARS) 1.4 % ophthalmic solution 1 drop  1 drop Both Eyes QHS Kyle, Tyrone A, DO   1 drop at 08/11/22 2146   sertraline (ZOLOFT) tablet 50 mg  50 mg Oral q morning Kyle, Tyrone A, DO   50 mg at 08/12/22 0931   sodium chloride tablet 1 g  1 g Oral TID Cherylann Ratel A, DO   1 g at 08/12/22 0930   traMADol (ULTRAM) tablet 50 mg  50 mg Oral Q12H PRN Barb Merino, MD   50 mg at 08/10/22 9198     Discharge Medications: Please see discharge summary for a list of discharge medications.  Relevant Imaging Results:  Relevant Lab Results:   Additional Information SSN: [id number] Has had COVID vaccinations  Leeroy Cha, RN

## 2022-08-12 NOTE — Discharge Summary (Signed)
Physician Discharge Summary  Maria [name] KYH:[id number] DOB: 04-Dec-1923 DOA: 08/08/2022  PCP: [name], MD  Admit date: 08/08/2022 Discharge date: 08/12/2022  Admitted From: Skilled nursing facility Disposition: Skilled nursing facility  Recommendations for Outpatient Follow-up:  Follow up with PCP after arriving to skilled nursing rehab. Consult palliative care team on arrival to skilled nursing rehab.  Home Health: N/A Equipment/Devices: N/A  Discharge Condition: Fair CODE STATUS: DNR/DNI Diet recommendation: Regular diet, nutritional supplements.  Gluten-free.  Discharge summary: 88 year old with history of paroxysmal A-fib on Eliquis, history of stroke, hypertension, chronic diastolic heart failure presented to the emergency room with shortness of breath and hypoxemia from skilled nursing facility.  Patient was admitted to the hospital 2 months ago with traumatic pelvic fracture and left wrist fracture and discharged to a SNF.  Since then she had worsening shortness of breath and was diagnosed with pleural effusion, right lower lobe tumor and was scheduled for PET scan on 10/18 by Pulmonary.  Pleural fluid was negative for malignancy.  Brought to the ER with persistent symptoms. She was coughing and nauseated with cough.  COVID-19 negative.  She was on doxycycline before coming to the ER.   PET scan done as inpatient, that indicates possible ascending colon cancer with local mets, solitary mets lesion in the liver. Right lower lobe tumor likely cancer because of other metabolically active lymph nodes and intra thoracic region and also in the mesenteric.  Remains debilitated but motivated to rehab.     Assessment & Plan of care :   Acute hypoxemic respiratory failure, presentation oxygen 83% on room air and shortness of breath corrected with supplemental oxygen. Bilateral pleural effusion, transudate on recent thoracentesis, recurrent right more than left. Right lower  lobe tumor, increasing and cavitating on repeat CT scan.   Patient has groundglass opacity with COVID-19 test was negative. Sputum cultures negative so far.  Blood cultures negative so far.  Thoracentesis 9/28, transudate and no growth.  No malignant cells.   Repeat thoracentesis in this hospitalization, transudate and no growth.  No malignant cells. On doxycycline, completed 7 days of therapy.  Added Rocephin and will complete 5 days of therapy today. We will continue with chest physiotherapy, mucolytic's and bronchodilators.  Mobility. Supplemental oxygen to keep saturation more than 90%. Unsure there is infection component of the right lower lobe centrally necrotic mass, will treat presumptively with doxycycline for 2 more weeks. Pulmonary will follow-up.  I have updated them about PET scan results.  Paroxysmal A-fib, currently sinus rhythm on amiodarone and Cardizem.  Eliquis resumed and tolerated.   Chronic diastolic congestive heart failure, unlikely contributing.  Treated with IV diuretics.  Will change to oral Lasix on discharge along with potassium supplements.   Debility with recent pelvic fracture and left wrist fracture, work with PT OT.  May still need more rehab.   Hypothyroidism, on Synthroid.  Resumed.   Hyponatremia, chronic and persistent.  On sodium chloride tablets.   Chronic indwelling Foley catheter secondary to immobility and recent pelvic fracture, failed voiding trial.  Continue catheter care until improvement of mobility.  She may become catheter dependent.   Goals of care: Multiple discussions with patient, multiple family members including patient's children. Patient is well aware about her diagnosis. Liberalize diet to regular diet and take of restrictions. Patient and family agreed to continue attempt rehab, palliative follow-up at SNF. Even though have diagnosis of possible metastatic cancer, with her worsening frailty and debility, she will benefit with  supportive treatment  but no cancer therapy.  Attempting biopsies may cause more complications and patient and family agreeable. Provide oxygen for comfort and to keep saturation more than 90%. Continue to attempt PT OT. Focus on symptom control and comfort, will prescribe some tramadol for pain relief. If worsening shortness of breath, repeat thoracentesis may be helpful for symptom control. If her clinical situation to decline, appropriate for hospice level of care.    Discharge Diagnoses:  Active Problems:   Hyponatremia   PAF (paroxysmal atrial fibrillation) (HCC)   Depression   Hypothyroidism   DVT (deep venous thrombosis) (HCC)   Acute on chronic diastolic CHF (congestive heart failure) (HCC)   Pleural effusion   Shortness of breath   Hypokalemia    Discharge Instructions  Discharge Instructions     Diet general   Complete by: As directed    Discharge instructions   Complete by: As directed    Consult palliative care at SNF Provide oxygen for comfort or to keep oxygen saturations >90%   Discharge wound care:   Complete by: As directed    Silicone foam dressings to the sacrum and spine as needed for Stage 1 PI change every 3 days. Assess under dressings each shift for any acute changes in the wounds.   Increase activity slowly   Complete by: As directed       Allergies as of 08/12/2022       Reactions   Fentanyl Nausea And Vomiting   Severe n/v   Amoxicillin Nausea Only, Other (See Comments)   dizziness   Atorvastatin Other (See Comments)   Unknown reaction - reported by Sadie Haber   Bupropion Other (See Comments)   nightmares   Codeine Nausea And Vomiting   Fosamax [alendronate Sodium] Other (See Comments)   Jaw pain   Gabapentin Other (See Comments)   dizziness   Gluten Meal Other (See Comments)   Celiac Disease   Hydrochlorothiazide Other (See Comments)   Hyponatremia, GI upset   Tetanus Toxoid, Adsorbed Swelling   Severe swelling at site of injection  (pt tolerates 1/2 injection then 1/2 week later)        Medication List     TAKE these medications    acetaminophen 500 MG tablet Commonly known as: TYLENOL Take 1 tablet (500 mg total) by mouth every 6 (six) hours as needed (pain). What changed:  how much to take when to take this   amiodarone 200 MG tablet Commonly known as: PACERONE TAKE 1 TABLET BY MOUTH DAILY What changed: when to take this   apixaban 2.5 MG Tabs tablet Commonly known as: ELIQUIS Take 1 tablet (2.5 mg total) by mouth 2 (two) times daily.   benzonatate 100 MG capsule Commonly known as: TESSALON Take 1 capsule (100 mg total) by mouth 3 (three) times daily as needed for up to 7 days for cough. What changed:  when to take this reasons to take this   BOOST PO Take 273 mLs by mouth 3 (three) times daily.   cholecalciferol 10 MCG (400 UNIT) Tabs tablet Commonly known as: VITAMIN D3 Take 1 tablet (400 Units total) by mouth daily.   diltiazem 120 MG 24 hr capsule Commonly known as: CARDIZEM CD Take 1 capsule (120 mg total) by mouth daily.   docusate sodium 100 MG capsule Commonly known as: COLACE Take 100 mg by mouth at bedtime.   doxycycline 100 MG tablet Commonly known as: VIBRA-TABS Take 1 tablet (100 mg total) by mouth 2 (two) times daily for  14 days.   ferrous sulfate 325 (65 FE) MG tablet Take 325 mg by mouth daily with breakfast.   furosemide 40 MG tablet Commonly known as: LASIX Take 80 mg by mouth every other day, alternate with 40 mg by mouth every other day. What changed:  how much to take how to take this when to take this additional instructions   guaiFENesin 100 MG/5ML liquid Commonly known as: ROBITUSSIN Take 5 mLs by mouth every 4 (four) hours as needed for cough or to loosen phlegm.   ICY HOT EX Apply 1 application topically daily as needed (pain).   ipratropium-albuterol 0.5-2.5 (3) MG/3ML Soln Commonly known as: DUONEB Take 3 mLs by nebulization every 6 (six)  hours as needed (for shortness of breath, excess coughing or wheezing).   lactobacillus acidophilus Tabs tablet Take 1 tablet by mouth daily.   Levothyroxine Sodium 25 MCG Caps Take 25 mcg by mouth daily before breakfast.   magnesium hydroxide 400 MG/5ML suspension Commonly known as: MILK OF MAGNESIA Take 15 mLs by mouth at bedtime as needed for mild constipation.   melatonin 5 MG Tabs Take 1-2 tablets (5-10 mg total) by mouth at bedtime. What changed: how much to take   ondansetron 4 MG tablet Commonly known as: ZOFRAN Take 4 mg by mouth every 8 (eight) hours as needed for nausea or vomiting.   polyethylene glycol 17 g packet Commonly known as: MiraLax Take 17 g by mouth daily.   potassium chloride SA 20 MEQ tablet Commonly known as: KLOR-CON M Take 2 tablets (40 mEq total) by mouth daily. Start taking on: August 13, 2022   sertraline 50 MG tablet Commonly known as: ZOLOFT Take 50 mg by mouth every morning. What changed: Another medication with the same name was removed. Continue taking this medication, and follow the directions you see here.   sodium chloride 1 g tablet Take 1 g by mouth 3 (three) times daily.   Systane 0.4-0.3 % Soln Generic drug: Polyethyl Glycol-Propyl Glycol Place 1 drop into both eyes at bedtime.   traMADol 50 MG tablet Commonly known as: ULTRAM Take 1 tablet (50 mg total) by mouth every 12 (twelve) hours as needed for up to 5 days for moderate pain.               Discharge Care Instructions  (From admission, onward)           Start     Ordered   08/12/22 0000  Discharge wound care:       Comments: Silicone foam dressings to the sacrum and spine as needed for Stage 1 PI change every 3 days. Assess under dressings each shift for any acute changes in the wounds.   08/12/22 1339            Allergies  Allergen Reactions   Fentanyl Nausea And Vomiting    Severe n/v   Amoxicillin Nausea Only and Other (See Comments)     dizziness   Atorvastatin Other (See Comments)    Unknown reaction - reported by Sadie Haber   Bupropion Other (See Comments)    nightmares   Codeine Nausea And Vomiting   Fosamax [Alendronate Sodium] Other (See Comments)    Jaw pain   Gabapentin Other (See Comments)    dizziness   Gluten Meal Other (See Comments)    Celiac Disease    Hydrochlorothiazide Other (See Comments)    Hyponatremia, GI upset    Tetanus Toxoid, Adsorbed Swelling    Severe swelling  at site of injection (pt tolerates 1/2 injection then 1/2 week later)    Consultations: Palliative   Procedures/Studies: NM PET Image Initial (PI) Skull Base To Thigh (F-18 FDG)  Result Date: 08/11/2022 CLINICAL DATA:  Initial treatment strategy for pulmonary nodules. EXAM: NUCLEAR MEDICINE PET SKULL BASE TO THIGH TECHNIQUE: 5.0 mCi F-18 FDG was injected intravenously. Full-ring PET imaging was performed from the skull base to thigh after the radiotracer. CT data was obtained and used for attenuation correction and anatomic localization. Fasting blood glucose: 90 mg/dl COMPARISON:  Chest CT 08/09/2022 FINDINGS: Mediastinal blood pool activity: SUV max 1.5 Liver activity: SUV max NA NECK: No significant abnormal hypermetabolic activity in this region. Incidental CT findings: Mild chronic left maxillary sinusitis. Bilateral common carotid atherosclerotic calcification. CHEST: Indistinct and likely cavitary lesion in the right lower lobe with a small amount of internal gas density, maximum SUV 9.1, region of involvement measures about 5.8 by 4.5 cm. Bilateral hypermetabolic ground-glass opacities and airspace opacities in both lungs. These are not substantially non nodular but are notably hypermetabolic. A 3.3 by 2.3 cm sub solid nodule anteriorly in the right upper lobe on image 48 series 4 has maximum SUV of 9.6. Hypermetabolic lower paratracheal lymph node anterior to the carina, 1.9 cm in short axis on image 60 series 4, maximum SUV 5.7. Small  hypermetabolic bilateral supraclavicular nodes including a 0.5 cm in short axis right supraclavicular node on image 43 series 4 with maximum SUV 5.7. Incidental CT findings: Moderate bilateral pleural effusions. Airway thickening bilaterally. Moderate cardiomegaly. Low-density blood pool suggests anemia. Coronary, aortic arch, and branch vessel atherosclerotic vascular disease. Left atrial appendage clamp noted. ABDOMEN/PELVIS: Hypermetabolic mass within or less likely along the ascending colon, about 2.6 cm in diameter, maximum SUV 10.7. Is hypermetabolic mesenteric adenopathy, index 1.0 cm central mesenteric node on image 121 series 4 has a maximum SUV of 7.6. Other hypermetabolic mesenteric lymph nodes are also present. 1.0 cm inferior right hepatic lobe lesion on image 106 series 4 has maximum SUV of 6.3, compatible with metastatic lesion. Questionable tiny foci of accentuated metabolic activity along the liver capsule particularly involving the right hepatic lobe. Incidental CT findings: Hyperdense liver favoring hemochromatosis. Prominent atherosclerotic vascular disease including the abdominal aorta and superior mesenteric artery. Hazy subcutaneous and mesenteric edema. Sigmoid colon diverticulosis. SKELETON: No significant abnormal hypermetabolic activity in this region. Incidental CT findings: Right hip arthroplasty. Cannulated left hip screws. Healing fractures of the left sacral ala, and left pubic rami. Levoconvex lumbar scoliosis with rotary component. IMPRESSION: 1. Hypermetabolic ascending colon mass with hypermetabolic central mesenteric adenopathy and a solitary hepatic parenchymal metastatic lesion. Appearance favors colon cancer. 2. There also some scattered tiny foci of accentuated metabolic activity along the liver capsule, peritoneal spread of tumor not excluded. 3. 5.8 cm cavitary lesion in the right lower lobe with a small amount of internal gas and hypermetabolic activity up to SUV 9.1,  possibilities include atypical infection or cavitary malignancy. There is also hypermetabolic paratracheal and supraclavicular adenopathy. 4. Bilateral hypermetabolic ground-glass opacities and airspace opacities in both lungs, peripheral distribution, probably from some type of atypical pneumonia. Given the peripheral opacities, consider COVID testing if not already performed. 5. Healing left pelvic fractures. 6. Moderate bilateral pleural effusions with mesenteric and subcutaneous edema compatible with third spacing of fluid. 7. Moderate cardiomegaly.  Low-density blood pool suggests anemia. 8. Aortic Atherosclerosis (ICD10-I70.0). Coronary and systemic atherosclerosis. 9. Levoconvex lumbar scoliosis with rotary component. 10. Sigmoid colon diverticulosis. 11. Hyperdense liver favoring  hemochromatosis. Electronically Signed   By: Van Clines M.D.   On: 08/11/2022 13:49   US THORACENTESIS ASP PLEURAL SPACE W/IMG GUIDE  Result Date: 08/10/2022 INDICATION: Diastolic heart failure with right pleural effusion. Request for diagnostic and therapeutic. EXAM: ULTRASOUND GUIDED RIGHT THORACENTESIS MEDICATIONS: 1% lidocaine 8 mL COMPLICATIONS: None immediate. PROCEDURE: An ultrasound guided thoracentesis was thoroughly discussed with the patient and questions answered. The benefits, risks, alternatives and complications were also discussed. The patient understands and wishes to proceed with the procedure. Written consent was obtained. Ultrasound was performed to localize and mark an adequate pocket of fluid in the right chest. The area was then prepped and draped in the normal sterile fashion. 1% Lidocaine was used for local anesthesia. Under ultrasound guidance a 6 Fr Safe-T-Centesis catheter was introduced. Thoracentesis was performed. The catheter was removed and a dressing applied. FINDINGS: A total of approximately 750 mL of clear yellow fluid was removed. Samples were sent to the laboratory as requested by  the clinical team. IMPRESSION: Successful ultrasound guided right thoracentesis yielding 750 mL of pleural fluid. No pneumothorax on post-procedure chest x-ray. Procedure performed by: Gareth Eagle, PA-C Electronically Signed   By: Aletta Edouard M.D.   On: 08/10/2022 12:46   DG CHEST PORT 1 VIEW  Result Date: 08/10/2022 CLINICAL DATA:  Post thoracentesis. EXAM: PORTABLE CHEST 1 VIEW COMPARISON:  August 08, 2022 and thoracentesis procedural images from August 10, 2022 FINDINGS: EKG leads project over the chest. Post median sternotomy for CABG and atrial appendage clipping. Cardiomediastinal contours and hilar structures are stable with cardiac enlargement. Stable appearance of LEFT-sided pleural effusion. Diminished graded opacity in the RIGHT chest compatible with diminished RIGHT-sided pleural effusion. No signs of RIGHT-sided pneumothorax. No new areas of airspace disease with RIGHT hilar fullness as on previous imaging. Added density in the RIGHT infrahilar region compatible with known masslike area. On limited assessment there is no acute skeletal process. IMPRESSION: 1. Diminished RIGHT-sided pleural effusion. No signs of pneumothorax. 2. Stable LEFT-sided pleural effusion. 3. RIGHT infrahilar masslike area as on previous imaging. Findings are highly concerning for neoplasm. Would also correlate with any ongoing signs of infection that could indicate pulmonary abscess. 4. Post median sternotomy for CABG and LEFT atrial clipping with signs of cardiomegaly as before. Electronically Signed   By: Zetta Bills M.D.   On: 08/10/2022 10:45   ECHOCARDIOGRAM COMPLETE  Result Date: 08/09/2022    ECHOCARDIOGRAM REPORT   Patient Name:   MORANDA BILLIOT Date of Exam: 08/09/2022 Medical Rec #:  [id number]         Height:       60.0 in Accession #:    [id number]        Weight:       89.7 lb Date of Birth:  Aug 28, 1924         BSA:          1.328 m Patient Age:    41 years          BP:           132/83 mmHg  Patient Gender: F                 HR:           101 bpm. Exam Location:  Inpatient Procedure: 2D Echo, 3D Echo, Cardiac Doppler and Color Doppler Indications:    CHF-Acute Diastolic L38.10  History:        Patient has prior history of Echocardiogram examinations, most  recent 11/27/2018. CHF, CAD, Prior CABG, Stroke, Carotid Disease                 and Pulmonary HTN, Arrythmias:Atrial Fibrillation,                 Signs/Symptoms:Chest Pain; Risk Factors:Hypertension.  Sonographer:    Darlina Sicilian RDCS Referring Phys: [id number] [place]  1. Left ventricular ejection fraction, by estimation, is 60 to 65%. Left ventricular ejection fraction by 3D volume is 65 %. The left ventricle has normal function. The left ventricle has no regional wall motion abnormalities. There is moderate asymmetric left ventricular hypertrophy of the basal-septal segment. Diastolic function indeterminant due to Afib. Elevated left atrial pressure.  2. Right ventricular systolic function is mildly reduced. The right ventricular size is mildly enlarged. There is normal pulmonary artery systolic pressure. The estimated right ventricular systolic pressure is 94.7 mmHg.  3. Left atrial size was severely dilated.  4. Right atrial size was severely dilated.  5. Moderate pleural effusion in both left and right lateral regions.  6. The mitral valve is degenerative. Mild to moderate mitral valve regurgitation.  7. Tricuspid valve regurgitation is mild to moderate.  8. The aortic valve is tricuspid. There is mild calcification of the aortic valve. There is mild thickening of the aortic valve. Aortic valve regurgitation is mild. Aortic valve sclerosis/calcification is present, without any evidence of aortic stenosis.  9. Aortic dilatation noted. There is borderline dilatation of the ascending aorta, measuring 38 mm. 10. The inferior vena cava is normal in size with greater than 50% respiratory variability, suggesting right  atrial pressure of 3 mmHg. Comparison(s): Compared to prior TTE in 2020, there are now bilateral pleural effusions. Otherwise, there is no significant change. FINDINGS  Left Ventricle: Left ventricular ejection fraction, by estimation, is 60 to 65%. Left ventricular ejection fraction by 3D volume is 65 %. The left ventricle has normal function. The left ventricle has no regional wall motion abnormalities. The left ventricular internal cavity size was normal in size. There is moderate asymmetric left ventricular hypertrophy of the basal-septal segment. Diastolic function indeterminant due to Afib. Elevated left atrial pressure. Right Ventricle: The right ventricular size is mildly enlarged. No increase in right ventricular wall thickness. Right ventricular systolic function is mildly reduced. There is normal pulmonary artery systolic pressure. The tricuspid regurgitant velocity  is 2.66 m/s, and with an assumed right atrial pressure of 3 mmHg, the estimated right ventricular systolic pressure is 65.4 mmHg. Left Atrium: Left atrial size was severely dilated. Right Atrium: Right atrial size was severely dilated. Pericardium: There is no evidence of pericardial effusion. Mitral Valve: The mitral valve is degenerative in appearance. There is mild thickening of the mitral valve leaflet(s). There is mild calcification of the mitral valve leaflet(s). Mild to moderate mitral annular calcification. Mild to moderate mitral valve regurgitation. Tricuspid Valve: The tricuspid valve is normal in structure. Tricuspid valve regurgitation is mild to moderate. Aortic Valve: The aortic valve is tricuspid. There is mild calcification of the aortic valve. There is mild thickening of the aortic valve. Aortic valve regurgitation is mild. Aortic regurgitation PHT measures 567 msec. Aortic valve sclerosis/calcification is present, without any evidence of aortic stenosis. Pulmonic Valve: The pulmonic valve was normal in structure. Pulmonic  valve regurgitation is trivial. Aorta: Aortic dilatation noted. There is borderline dilatation of the ascending aorta, measuring 38 mm. Venous: The inferior vena cava is normal in size with greater than 50% respiratory variability, suggesting right  atrial pressure of 3 mmHg. IAS/Shunts: The atrial septum is grossly normal. Additional Comments: There is a moderate pleural effusion in both left and right lateral regions.  LEFT VENTRICLE PLAX 2D LVIDd:         4.50 cm         Diastology LVIDs:         2.80 cm         LV e' medial:    3.85 cm/s LV PW:         1.00 cm         LV E/e' medial:  28.2 LV IVS:        0.90 cm         LV e' lateral:   7.69 cm/s                                LV E/e' lateral: 14.1                                 3D Volume EF                                LV 3D EF:    Left                                             ventricul                                             ar                                             ejection                                             fraction                                             by 3D                                             volume is                                             65 %.                                 3D Volume EF:  3D EF:        65 % RIGHT VENTRICLE RV Basal diam:  3.70 cm RV Mid diam:    2.30 cm RV S prime:     7.42 cm/s LEFT ATRIUM             Index        RIGHT ATRIUM           Index LA diam:        4.30 cm 3.24 cm/m   RA Area:     14.90 cm LA Vol (A2C):   56.0 ml 42.18 ml/m  RA Volume:   32.50 ml  24.48 ml/m LA Vol (A4C):   77.6 ml 58.48 ml/m LA Biplane Vol: 72.9 ml 54.91 ml/m  AORTIC VALVE LVOT Vmax:   96.70 cm/s LVOT Vmean:  56.000 cm/s LVOT VTI:    0.160 m AI PHT:      567 msec  AORTA Ao Root diam: 2.50 cm Ao Asc diam:  3.80 cm MITRAL VALVE                  TRICUSPID VALVE MV Area (PHT): 5.39 cm       TR Peak grad:   28.3 mmHg MV Decel Time: 141 msec       TR Vmax:        266.00 cm/s MR Peak  grad:    90.6 mmHg MR Mean grad:    56.0 mmHg    SHUNTS MR Vmax:         476.00 cm/s  Systemic VTI: 0.16 m MR Vmean:        351.0 cm/s MR PISA:         1.01 cm MR PISA Eff ROA: 8 mm MR PISA Radius:  0.40 cm MV E velocity: 108.67 cm/s Gwyndolyn Kaufman MD Electronically signed by Gwyndolyn Kaufman MD Signature Date/Time: 08/09/2022/3:26:12 PM    Final    CT CHEST WO CONTRAST  Result Date: 08/09/2022 CLINICAL DATA:  Shortness of breath EXAM: CT CHEST WITHOUT CONTRAST TECHNIQUE: Multidetector CT imaging of the chest was performed following the standard protocol without IV contrast. RADIATION DOSE REDUCTION: This exam was performed according to the departmental dose-optimization program which includes automated exposure control, adjustment of the mA and/or kV according to patient size and/or use of iterative reconstruction technique. COMPARISON:  Previous CT done on 07/12/2022 and chest radiograph done on 08/08/2022 FINDINGS: Cardiovascular: Extensive coronary artery calcifications are seen. There is evidence of previous coronary bypass surgery. Heart is enlarged in size. There is a metallic clamp in the region of left atrial appendage. Scattered calcifications are seen in thoracic aorta. There is ectasia of ascending thoracic aorta measuring 3.9 cm. Mediastinum/Nodes: No new significant lymphadenopathy seen. Lungs/Pleura: Moderate to large bilateral pleural effusions are seen, more so on the right side with interval increase. There is interval appearance of multiple patchy ground-glass infiltrates of varying sizes in both upper and both mid lung fields. There is compression atelectasis in the lower lung fields, more so on the right side. There is 5.3 x 3.7 cm area of low-density with pocket of air in right lower lobe. Possibility of malignant neoplasm with necrosis should be considered. There is interval increase in size. There is no pneumothorax. Upper Abdomen: Arterial calcifications are seen in abdominal  aorta. Musculoskeletal: There is decrease in height of multiple thoracic vertebral bodies with no significant interval change. IMPRESSION: Moderate to large bilateral pleural effusions, more so on the right side with interval increase.  There is interval appearance of multiple patchy ground-glass infiltrates of varying sizes in both lungs suggesting multifocal pneumonia. Please correlate for possible COVID pneumonia. There is compression atelectasis in both lower lung fields, more so on the right side. There is 5.3 x 3.7 cm area of low-density with small area of cavitation in right lower lobe suggesting possible malignant neoplasm with interval increase in size. Cardiomegaly.  Coronary artery disease. Other findings as described in the body of the report. Electronically Signed   By: Elmer Picker M.D.   On: 08/09/2022 10:13   DG Chest Port 1 View  Result Date: 08/08/2022 CLINICAL DATA:  Shortness of breath EXAM: PORTABLE CHEST 1 VIEW COMPARISON:  07/22/2022 FINDINGS: Cardiac shadow is enlarged. Postsurgical changes are again seen and stable. Bilateral pleural effusions are seen right greater than left slightly larger than that seen on the prior study. No focal infiltrate is noted. No bony abnormality is seen. IMPRESSION: Bilateral effusions right greater than left increased from the prior study. Electronically Signed   By: Inez Catalina M.D.   On: 08/08/2022 02:00   DG CHEST PORT 1 VIEW  Result Date: 07/22/2022 CLINICAL DATA:  Post right thoracentesis EXAM: PORTABLE CHEST 1 VIEW COMPARISON:  Chest 06/16/2022 FINDINGS: No pneumothorax post right thoracentesis. Decreased right pleural effusion which is small. Right lower lobe mass density as noted on CT. Possible neoplasm. Small left pleural effusion slightly increased from the prior study. Mild left lower lobe atelectasis Postop CABG.  Negative for heart failure or edema IMPRESSION: Negative for pneumothorax post right thoracentesis. Right lower lobe  density may represent mass lesion. Correlate with recent CT results. Increase in small left pleural effusion and left lower lobe atelectasis. Electronically Signed   By: Franchot Gallo M.D.   On: 07/22/2022 16:43   (Echo, Carotid, EGD, Colonoscopy, ERCP)    Subjective: Patient seen multiple times today.  Family members at the bedside.  Cough has improved.  Remains very weak and wants to work with therapies.   Discharge Exam: Vitals:   08/12/22 0515 08/12/22 1316  BP: (!) 121/58 120/75  Pulse: 79 98  Resp: (!) 24   Temp: 97.8 F (36.6 C) 97.6 F (36.4 C)  SpO2: 97% 98%   Vitals:   08/11/22 2214 08/11/22 2255 08/12/22 0515 08/12/22 1316  BP: 133/87  (!) 121/58 120/75  Pulse: 91  79 98  Resp: (!) 26 (!) 21 (!) 24   Temp: 98.1 F (36.7 C)  97.8 F (36.6 C) 97.6 F (36.4 C)  TempSrc: Oral  Oral Oral  SpO2: 95%  97% 98%  Weight:      Height:        General: Pt is alert, awake, frail and debilitated.  In mild distress while coughing otherwise looks comfortable.  Hard of hearing. Cardiovascular: RRR, S1/S2 +, no rubs, no gallops Respiratory: Poor bilateral air entry at bases.  Poor inspiratory effort.  Conducted upper airway sounds. Abdominal: Soft, NT, ND, bowel sounds +, Foley catheter with clear urine. Extremities: no edema, no cyanosis Left wrist on brace.    The results of significant diagnostics from this hospitalization (including imaging, microbiology, ancillary and laboratory) are listed below for reference.     Microbiology: Recent Results (from the past 240 hour(s))  Blood culture (routine x 2)     Status: None (Preliminary result)   Collection Time: 08/08/22  2:00 AM   Specimen: Left Antecubital; Blood  Result Value Ref Range Status   Specimen Description   Final  LEFT ANTECUBITAL BLOOD Performed at [facility], [place] [address], [place], [place] [address]    Special Requests   Final    BOTTLES DRAWN AEROBIC AND ANAEROBIC Blood Culture results may  not be optimal due to an excessive volume of blood received in culture bottles Performed at [place] [address], [place], [place] [address]    Culture   Final    NO GROWTH 4 DAYS Performed at [facility], [place] [address] [place], [place] [address]    Report Status PENDING  Incomplete  Blood culture (routine x 2)     Status: None (Preliminary result)   Collection Time: 08/08/22  2:05 AM   Specimen: Right Antecubital; Blood  Result Value Ref Range Status   Specimen Description   Final    RIGHT ANTECUBITAL BLOOD Performed at [facility], [place] [address] [place], [place] [address]    Special Requests   Final    BOTTLES DRAWN AEROBIC AND ANAEROBIC Blood Culture results may not be optimal due to an excessive volume of blood received in culture bottles Performed at [place] [address] [place], [place] [address]    Culture   Final    NO GROWTH 4 DAYS Performed at [facility], [place] [address], [place], [place] [address]    Report Status PENDING  Incomplete  SARS Coronavirus 2 by RT PCR (hospital order, performed in [facility] hospital lab) *cepheid single result test* Anterior Nasal Swab     Status: None   Collection Time: 08/08/22 11:29 AM   Specimen: Anterior Nasal Swab  Result Value Ref Range Status   SARS Coronavirus 2 by RT PCR NEGATIVE NEGATIVE Final    Comment: (NOTE) SARS-CoV-2 target nucleic acids are NOT DETECTED.  The SARS-CoV-2 RNA is generally detectable in upper and lower respiratory specimens during the acute phase of infection. The lowest concentration of SARS-CoV-2 viral copies this assay can detect is 250 copies / mL. A negative result does not preclude SARS-CoV-2 infection and should not be used as the sole basis for treatment or other patient management decisions.  A negative result may occur with improper specimen collection / handling, submission of specimen other than nasopharyngeal  swab, presence of viral mutation(s) within the areas targeted by this assay, and inadequate number of viral copies (<250 copies / mL). A negative result must be combined with clinical observations, patient history, and epidemiological information.  Fact Sheet for Patients:   https://www.patel.info/  Fact Sheet for Healthcare Providers: https://hall.com/  This test is not yet approved or  cleared by the [place] FDA and has been authorized for detection and/or diagnosis of SARS-CoV-2 by FDA under an Emergency Use Authorization (EUA).  This EUA will remain in effect (meaning this test can be used) for the duration of the COVID-19 declaration under Section 564(b)(1) of the Act, 21 U.S.C. section 360bbb-3(b)(1), unless the authorization is terminated or revoked sooner.  Performed at [facility], [place] [address], [place], [place] [address]   Body fluid culture w Gram Stain     Status: None (Preliminary result)   Collection Time: 08/10/22 10:10 AM   Specimen: PATH Cytology Pleural fluid  Result Value Ref Range Status   Specimen Description   Final    FLUID RIGHT PLEURAL Performed at [place] [address] [place], [place] [address]    Special Requests   Final    NONE Performed  at [facility], [place] [address] [place], [place] [address]    Gram Stain NO WBC SEEN NO ORGANISMS SEEN   Final   Culture   Final    NO GROWTH 2 DAYS Performed at [facility], [place] [address], [place], [place] [address]    Report Status PENDING  Incomplete     Labs: BNP (last 3 results) Recent Labs    06/14/22 0221 06/16/22 0909 08/08/22 0200  BNP 276.0* 249.0* 127.5*   Basic Metabolic Panel: Recent Labs  Lab 08/08/22 0200 08/08/22 0953 08/09/22 0318  NA 127* 127* 129*  K 2.9* 3.3* 3.4*  CL 95* 95* 96*  CO2 26 23 27   GLUCOSE 118* 98 100*  BUN 9 9 9   CREATININE 0.42* 0.54  0.36*  CALCIUM 7.9* 7.9* 8.1*  MG  --  1.9  --   PHOS  --  3.0  --    Liver Function Tests: Recent Labs  Lab 08/08/22 0953 08/09/22 0318  AST  --  101*  ALT  --  56*  ALKPHOS  --  98  BILITOT  --  0.5  PROT  --  6.0*  ALBUMIN 2.5* 2.3*   No results for input(s): "LIPASE", "AMYLASE" in the last 168 hours. No results for input(s): "AMMONIA" in the last 168 hours. CBC: Recent Labs  Lab 08/08/22 0200 08/09/22 0318 08/12/22 0357  WBC 8.4 8.0 6.6  NEUTROABS 7.2  --   --   HGB 10.6* 10.4* 9.8*  HCT 31.3* 31.1* 29.7*  MCV 89.4 88.6 89.5  PLT 491* 455* 406*   Cardiac Enzymes: No results for input(s): "CKTOTAL", "CKMB", "CKMBINDEX", "TROPONINI" in the last 168 hours. BNP: Invalid input(s): "POCBNP" CBG: Recent Labs  Lab 08/11/22 1121  GLUCAP 90   D-Dimer No results for input(s): "DDIMER" in the last 72 hours. Hgb A1c No results for input(s): "HGBA1C" in the last 72 hours. Lipid Profile No results for input(s): "CHOL", "HDL", "LDLCALC", "TRIG", "CHOLHDL", "LDLDIRECT" in the last 72 hours. Thyroid function studies No results for input(s): "TSH", "T4TOTAL", "T3FREE", "THYROIDAB" in the last 72 hours.  Invalid input(s): "FREET3" Anemia work up No results for input(s): "VITAMINB12", "FOLATE", "FERRITIN", "TIBC", "IRON", "RETICCTPCT" in the last 72 hours. Urinalysis    Component Value Date/Time   COLORURINE YELLOW 08/08/2022 0211   APPEARANCEUR HAZY (A) 08/08/2022 0211   LABSPEC 1.015 08/08/2022 0211   PHURINE 6.0 08/08/2022 0211   GLUCOSEU NEGATIVE 08/08/2022 0211   HGBUR NEGATIVE 08/08/2022 0211   BILIRUBINUR NEGATIVE 08/08/2022 0211   KETONESUR NEGATIVE 08/08/2022 0211   PROTEINUR 30 (A) 08/08/2022 0211   UROBILINOGEN 0.2 06/09/2015 2120   NITRITE NEGATIVE 08/08/2022 0211   LEUKOCYTESUR LARGE (A) 08/08/2022 0211   Sepsis Labs Recent Labs  Lab 08/08/22 0200 08/09/22 0318 08/12/22 0357  WBC 8.4 8.0 6.6   Microbiology Recent Results (from the past 240  hour(s))  Blood culture (routine x 2)     Status: None (Preliminary result)   Collection Time: 08/08/22  2:00 AM   Specimen: Left Antecubital; Blood  Result Value Ref Range Status   Specimen Description   Final    LEFT ANTECUBITAL BLOOD Performed at [facility], [address], [place], [place] [address]    Special Requests   Final    BOTTLES DRAWN AEROBIC AND ANAEROBIC Blood Culture results may not be optimal due to an excessive volume of blood received in culture bottles Performed at [facility] [place]  [address] [place], [place] [address]    Culture   Final    NO GROWTH 4 DAYS Performed at [facility], [place] [address] [place], [place] [address]    Report Status PENDING  Incomplete  Blood culture (routine x 2)     Status: None (Preliminary result)   Collection Time: 08/08/22  2:05 AM   Specimen: Right Antecubital; Blood  Result Value Ref Range Status   Specimen Description   Final    RIGHT ANTECUBITAL BLOOD Performed at [facility], [place] [address] [place], [place] [address]    Special Requests   Final    BOTTLES DRAWN AEROBIC AND ANAEROBIC Blood Culture results may not be optimal due to an excessive volume of blood received in culture bottles Performed at [place] [address], [place], [place] [address]    Culture   Final    NO GROWTH 4 DAYS Performed at [facility], [place] [address] [place], [place] [address]    Report Status PENDING  Incomplete  SARS Coronavirus 2 by RT PCR (hospital order, performed in [facility] hospital lab) *cepheid single result test* Anterior Nasal Swab     Status: None   Collection Time: 08/08/22 11:29 AM   Specimen: Anterior Nasal Swab  Result Value Ref Range Status   SARS Coronavirus 2 by RT PCR NEGATIVE NEGATIVE Final    Comment: (NOTE) SARS-CoV-2 target nucleic acids are NOT DETECTED.  The SARS-CoV-2 RNA is generally detectable in upper and lower respiratory specimens  during the acute phase of infection. The lowest concentration of SARS-CoV-2 viral copies this assay can detect is 250 copies / mL. A negative result does not preclude SARS-CoV-2 infection and should not be used as the sole basis for treatment or other patient management decisions.  A negative result may occur with improper specimen collection / handling, submission of specimen other than nasopharyngeal swab, presence of viral mutation(s) within the areas targeted by this assay, and inadequate number of viral copies (<250 copies / mL). A negative result must be combined with clinical observations, patient history, and epidemiological information.  Fact Sheet for Patients:   https://www.patel.info/  Fact Sheet for Healthcare Providers: https://hall.com/  This test is not yet approved or  cleared by the [place] FDA and has been authorized for detection and/or diagnosis of SARS-CoV-2 by FDA under an Emergency Use Authorization (EUA).  This EUA will remain in effect (meaning this test can be used) for the duration of the COVID-19 declaration under Section 564(b)(1) of the Act, 21 U.S.C. section 360bbb-3(b)(1), unless the authorization is terminated or revoked sooner.  Performed at [facility], [place] [address], [place], [place] [address]   Body fluid culture w Gram Stain     Status: None (Preliminary result)   Collection Time: 08/10/22 10:10 AM   Specimen: PATH Cytology Pleural fluid  Result Value Ref Range Status   Specimen Description   Final    FLUID RIGHT PLEURAL Performed at [place] [address] [place], [place] [address]    Special Requests   Final    NONE Performed at [facility], [place] [address] [place], [place] [address]    Gram Stain NO WBC SEEN NO ORGANISMS SEEN   Final   Culture   Final    NO GROWTH 2 DAYS Performed at [facility], [place]  [address] [place], [place] [address]    Report Status PENDING  Incomplete     Time coordinating discharge: 35 minutes  SIGNED:   Barb Merino, MD  [facility] 08/12/2022, 1:51 PM
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
## 2022-08-13 ENCOUNTER — Ambulatory Visit (HOSPITAL_COMMUNITY): Payer: Medicare Other

## 2022-08-13 LAB — CULTURE, BLOOD (ROUTINE X 2)
Culture: NO GROWTH
Culture: NO GROWTH

## 2022-08-13 LAB — BODY FLUID CULTURE W GRAM STAIN
Culture: NO GROWTH
Gram Stain: NONE SEEN

## 2022-08-16 ENCOUNTER — Ambulatory Visit (INDEPENDENT_AMBULATORY_CARE_PROVIDER_SITE_OTHER): Payer: Medicare Other | Admitting: Pulmonary Disease

## 2022-08-16 ENCOUNTER — Encounter: Payer: Self-pay | Admitting: Pulmonary Disease

## 2022-08-16 VITALS — BP 96/40 | HR 65 | Ht 60.0 in | Wt 88.0 lb

## 2022-08-16 DIAGNOSIS — R918 Other nonspecific abnormal finding of lung field: Secondary | ICD-10-CM | POA: Diagnosis not present

## 2022-08-16 DIAGNOSIS — I110 Hypertensive heart disease with heart failure: Secondary | ICD-10-CM | POA: Diagnosis not present

## 2022-08-16 DIAGNOSIS — R1314 Dysphagia, pharyngoesophageal phase: Secondary | ICD-10-CM | POA: Diagnosis not present

## 2022-08-16 DIAGNOSIS — Z993 Dependence on wheelchair: Secondary | ICD-10-CM | POA: Diagnosis not present

## 2022-08-16 DIAGNOSIS — K6389 Other specified diseases of intestine: Secondary | ICD-10-CM | POA: Diagnosis not present

## 2022-08-16 DIAGNOSIS — J90 Pleural effusion, not elsewhere classified: Secondary | ICD-10-CM

## 2022-08-16 DIAGNOSIS — I4891 Unspecified atrial fibrillation: Secondary | ICD-10-CM | POA: Diagnosis not present

## 2022-08-16 DIAGNOSIS — I5032 Chronic diastolic (congestive) heart failure: Secondary | ICD-10-CM | POA: Diagnosis not present

## 2022-08-16 DIAGNOSIS — I272 Pulmonary hypertension, unspecified: Secondary | ICD-10-CM | POA: Diagnosis not present

## 2022-08-16 DIAGNOSIS — Z8701 Personal history of pneumonia (recurrent): Secondary | ICD-10-CM | POA: Diagnosis not present

## 2022-08-16 DIAGNOSIS — C7800 Secondary malignant neoplasm of unspecified lung: Secondary | ICD-10-CM | POA: Diagnosis not present

## 2022-08-16 DIAGNOSIS — Z515 Encounter for palliative care: Secondary | ICD-10-CM | POA: Diagnosis not present

## 2022-08-16 DIAGNOSIS — R591 Generalized enlarged lymph nodes: Secondary | ICD-10-CM | POA: Diagnosis present

## 2022-08-16 DIAGNOSIS — R1084 Generalized abdominal pain: Secondary | ICD-10-CM | POA: Diagnosis not present

## 2022-08-16 DIAGNOSIS — Z8249 Family history of ischemic heart disease and other diseases of the circulatory system: Secondary | ICD-10-CM | POA: Diagnosis not present

## 2022-08-16 DIAGNOSIS — I469 Cardiac arrest, cause unspecified: Secondary | ICD-10-CM | POA: Diagnosis not present

## 2022-08-16 DIAGNOSIS — S32592D Other specified fracture of left pubis, subsequent encounter for fracture with routine healing: Secondary | ICD-10-CM | POA: Diagnosis not present

## 2022-08-16 DIAGNOSIS — E039 Hypothyroidism, unspecified: Secondary | ICD-10-CM | POA: Diagnosis not present

## 2022-08-16 DIAGNOSIS — Z9181 History of falling: Secondary | ICD-10-CM | POA: Diagnosis not present

## 2022-08-16 DIAGNOSIS — R54 Age-related physical debility: Secondary | ICD-10-CM | POA: Diagnosis present

## 2022-08-16 DIAGNOSIS — E871 Hypo-osmolality and hyponatremia: Secondary | ICD-10-CM | POA: Diagnosis not present

## 2022-08-16 DIAGNOSIS — I25709 Atherosclerosis of coronary artery bypass graft(s), unspecified, with unspecified angina pectoris: Secondary | ICD-10-CM | POA: Diagnosis not present

## 2022-08-16 DIAGNOSIS — C787 Secondary malignant neoplasm of liver and intrahepatic bile duct: Secondary | ICD-10-CM | POA: Diagnosis not present

## 2022-08-16 DIAGNOSIS — C785 Secondary malignant neoplasm of large intestine and rectum: Secondary | ICD-10-CM | POA: Diagnosis present

## 2022-08-16 DIAGNOSIS — S3210XD Unspecified fracture of sacrum, subsequent encounter for fracture with routine healing: Secondary | ICD-10-CM | POA: Diagnosis not present

## 2022-08-16 DIAGNOSIS — Z87440 Personal history of urinary (tract) infections: Secondary | ICD-10-CM | POA: Diagnosis not present

## 2022-08-16 DIAGNOSIS — Z789 Other specified health status: Secondary | ICD-10-CM | POA: Diagnosis not present

## 2022-08-16 DIAGNOSIS — C189 Malignant neoplasm of colon, unspecified: Secondary | ICD-10-CM | POA: Diagnosis not present

## 2022-08-16 DIAGNOSIS — I480 Paroxysmal atrial fibrillation: Secondary | ICD-10-CM | POA: Diagnosis not present

## 2022-08-16 DIAGNOSIS — R590 Localized enlarged lymph nodes: Secondary | ICD-10-CM | POA: Diagnosis not present

## 2022-08-16 DIAGNOSIS — R3989 Other symptoms and signs involving the genitourinary system: Secondary | ICD-10-CM | POA: Diagnosis not present

## 2022-08-16 DIAGNOSIS — I5022 Chronic systolic (congestive) heart failure: Secondary | ICD-10-CM | POA: Diagnosis present

## 2022-08-16 DIAGNOSIS — J189 Pneumonia, unspecified organism: Secondary | ICD-10-CM | POA: Diagnosis not present

## 2022-08-16 DIAGNOSIS — C7801 Secondary malignant neoplasm of right lung: Secondary | ICD-10-CM | POA: Diagnosis present

## 2022-08-16 DIAGNOSIS — R41841 Cognitive communication deficit: Secondary | ICD-10-CM | POA: Diagnosis not present

## 2022-08-16 DIAGNOSIS — R1110 Vomiting, unspecified: Secondary | ICD-10-CM | POA: Diagnosis not present

## 2022-08-16 DIAGNOSIS — R339 Retention of urine, unspecified: Secondary | ICD-10-CM | POA: Diagnosis not present

## 2022-08-16 DIAGNOSIS — C801 Malignant (primary) neoplasm, unspecified: Secondary | ICD-10-CM | POA: Diagnosis present

## 2022-08-16 DIAGNOSIS — G9340 Encephalopathy, unspecified: Secondary | ICD-10-CM | POA: Diagnosis not present

## 2022-08-16 DIAGNOSIS — S32502D Unspecified fracture of left pubis, subsequent encounter for fracture with routine healing: Secondary | ICD-10-CM | POA: Diagnosis not present

## 2022-08-16 DIAGNOSIS — S2241XA Multiple fractures of ribs, right side, initial encounter for closed fracture: Secondary | ICD-10-CM | POA: Diagnosis not present

## 2022-08-16 DIAGNOSIS — S52502D Unspecified fracture of the lower end of left radius, subsequent encounter for closed fracture with routine healing: Secondary | ICD-10-CM | POA: Diagnosis not present

## 2022-08-16 DIAGNOSIS — R2990 Unspecified symptoms and signs involving the nervous system: Secondary | ICD-10-CM | POA: Diagnosis not present

## 2022-08-16 DIAGNOSIS — Z951 Presence of aortocoronary bypass graft: Secondary | ICD-10-CM | POA: Diagnosis not present

## 2022-08-16 DIAGNOSIS — S329XXD Fracture of unspecified parts of lumbosacral spine and pelvis, subsequent encounter for fracture with routine healing: Secondary | ICD-10-CM | POA: Diagnosis not present

## 2022-08-16 DIAGNOSIS — M109 Gout, unspecified: Secondary | ICD-10-CM | POA: Diagnosis not present

## 2022-08-16 DIAGNOSIS — K9289 Other specified diseases of the digestive system: Secondary | ICD-10-CM | POA: Diagnosis not present

## 2022-08-16 DIAGNOSIS — I5020 Unspecified systolic (congestive) heart failure: Secondary | ICD-10-CM | POA: Diagnosis not present

## 2022-08-16 DIAGNOSIS — R197 Diarrhea, unspecified: Secondary | ICD-10-CM | POA: Diagnosis not present

## 2022-08-16 DIAGNOSIS — R5381 Other malaise: Secondary | ICD-10-CM | POA: Diagnosis not present

## 2022-08-16 DIAGNOSIS — L259 Unspecified contact dermatitis, unspecified cause: Secondary | ICD-10-CM | POA: Diagnosis not present

## 2022-08-16 DIAGNOSIS — R4182 Altered mental status, unspecified: Secondary | ICD-10-CM | POA: Diagnosis not present

## 2022-08-16 DIAGNOSIS — Z66 Do not resuscitate: Secondary | ICD-10-CM | POA: Diagnosis present

## 2022-08-16 DIAGNOSIS — R262 Difficulty in walking, not elsewhere classified: Secondary | ICD-10-CM | POA: Diagnosis not present

## 2022-08-16 DIAGNOSIS — I482 Chronic atrial fibrillation, unspecified: Secondary | ICD-10-CM | POA: Diagnosis present

## 2022-08-16 DIAGNOSIS — Z7901 Long term (current) use of anticoagulants: Secondary | ICD-10-CM | POA: Diagnosis not present

## 2022-08-16 DIAGNOSIS — R112 Nausea with vomiting, unspecified: Secondary | ICD-10-CM | POA: Diagnosis not present

## 2022-08-16 DIAGNOSIS — R55 Syncope and collapse: Secondary | ICD-10-CM | POA: Diagnosis not present

## 2022-08-16 DIAGNOSIS — T68XXXA Hypothermia, initial encounter: Secondary | ICD-10-CM | POA: Diagnosis not present

## 2022-08-16 DIAGNOSIS — R231 Pallor: Secondary | ICD-10-CM | POA: Diagnosis not present

## 2022-08-16 DIAGNOSIS — K7689 Other specified diseases of liver: Secondary | ICD-10-CM | POA: Diagnosis not present

## 2022-08-16 DIAGNOSIS — D491 Neoplasm of unspecified behavior of respiratory system: Secondary | ICD-10-CM | POA: Diagnosis not present

## 2022-08-16 DIAGNOSIS — K219 Gastro-esophageal reflux disease without esophagitis: Secondary | ICD-10-CM | POA: Diagnosis not present

## 2022-08-16 DIAGNOSIS — Z85038 Personal history of other malignant neoplasm of large intestine: Secondary | ICD-10-CM | POA: Diagnosis not present

## 2022-08-16 DIAGNOSIS — G9341 Metabolic encephalopathy: Secondary | ICD-10-CM | POA: Diagnosis present

## 2022-08-16 DIAGNOSIS — I2510 Atherosclerotic heart disease of native coronary artery without angina pectoris: Secondary | ICD-10-CM | POA: Diagnosis not present

## 2022-08-16 DIAGNOSIS — R569 Unspecified convulsions: Secondary | ICD-10-CM | POA: Diagnosis not present

## 2022-08-16 DIAGNOSIS — R001 Bradycardia, unspecified: Secondary | ICD-10-CM | POA: Diagnosis not present

## 2022-08-16 DIAGNOSIS — R110 Nausea: Secondary | ICD-10-CM | POA: Diagnosis not present

## 2022-08-16 DIAGNOSIS — R0689 Other abnormalities of breathing: Secondary | ICD-10-CM | POA: Diagnosis not present

## 2022-08-16 DIAGNOSIS — N1831 Chronic kidney disease, stage 3a: Secondary | ICD-10-CM | POA: Diagnosis not present

## 2022-08-16 DIAGNOSIS — M25552 Pain in left hip: Secondary | ICD-10-CM | POA: Diagnosis not present

## 2022-08-16 DIAGNOSIS — I130 Hypertensive heart and chronic kidney disease with heart failure and stage 1 through stage 4 chronic kidney disease, or unspecified chronic kidney disease: Secondary | ICD-10-CM | POA: Diagnosis not present

## 2022-08-16 DIAGNOSIS — M6281 Muscle weakness (generalized): Secondary | ICD-10-CM | POA: Diagnosis not present

## 2022-08-16 DIAGNOSIS — E43 Unspecified severe protein-calorie malnutrition: Secondary | ICD-10-CM | POA: Diagnosis not present

## 2022-08-16 DIAGNOSIS — M1812 Unilateral primary osteoarthritis of first carpometacarpal joint, left hand: Secondary | ICD-10-CM | POA: Diagnosis not present

## 2022-08-16 DIAGNOSIS — E860 Dehydration: Secondary | ICD-10-CM | POA: Diagnosis present

## 2022-08-16 DIAGNOSIS — G9389 Other specified disorders of brain: Secondary | ICD-10-CM | POA: Diagnosis not present

## 2022-08-16 DIAGNOSIS — K900 Celiac disease: Secondary | ICD-10-CM | POA: Diagnosis present

## 2022-08-16 DIAGNOSIS — R64 Cachexia: Secondary | ICD-10-CM | POA: Diagnosis present

## 2022-08-16 DIAGNOSIS — Z1152 Encounter for screening for COVID-19: Secondary | ICD-10-CM | POA: Diagnosis not present

## 2022-08-16 DIAGNOSIS — S22050A Wedge compression fracture of T5-T6 vertebra, initial encounter for closed fracture: Secondary | ICD-10-CM | POA: Diagnosis not present

## 2022-08-16 NOTE — Progress Notes (Signed)
Synopsis: Referred in September 2023 for lung mass by [name], MD  Subjective:   PATIENT ID: Maria [name] GENDER: female DOB: 06-29-1924, MRN: [id number]  Chief Complaint  Patient presents with   Follow-up    PET scan review, severe cough, SOB, 2l oxygen prn     This is a 88 year old female, past medical history of carotid stenosis, chronic anticoagulation atrial fibrillation, chronic diastolic heart failure, moderate sleep apnea.Patient had coronary artery bypass grafting in 2014 she is a lifelong non-smoker.Patient was referred after abnormal CT imaging on 07/12/2022 which was completed at [facility].  This revealed a right lower lobe mass measuring 3.5 x 4.2 x 4.0 cm concerning for malignancy.  Also there was an associated right hilar adenopathy.  Patient has no complaints.  She still has ongoing cough that is getting a little bit better.  She is very weak after having a fall with hip and pelvic fracture.  She is in a wheelchair today she is nonweightbearing.  OV 08/16/2022: Here today for follow-up after recent hospitalization and nuclear medicine pet imaging.  Unfortunately the large mass on the nuclear medicine pet imaging is concerning for malignancy she has mediastinal adenopathy as well as lesions within the chest as well as a colon lesion and adenopathy within the belly concerning for either a second primary or metastatic disease to the chest from the colon.  Overall looks like were dealing with advanced age malignancy and the patient that is 88 years old.  She was recently hospitalized.  Still back at rehab at this point outpatient palliative care is seeing her.  At some point, good decision for transition to hospice care as they have declined procedural biopsies or any type of confirmation of malignancy.  They would not want to undergo chemotherapy or radiation at the age of 58.    Past Medical History:  Diagnosis Date   Anemia    Atonic bladder  12/11/2013   Carotid artery occlusion    right carotid stenosis 40-60%, 4/08, neg for stenosis repeat study 11/11   Celiac disease    Chronic anticoagulation CHADS2VASC2 score 7 10/18/2014   Chronic diastolic CHF (congestive heart failure) (HCC)    takes Lasix daily   CKD (chronic kidney disease), stage III ([place])    Coronary artery disease    a.  08/2013 (LIMA-LAD, rSVG to LCX and Lt atrial clip),   Depression    DNR (do not resuscitate) 03/28/2021   DVT (deep venous thrombosis) ([place]) 09/2013   Gastric ulcer    6-19month ago   GERD (gastroesophageal reflux disease)    takes Omeprazole daily   GI bleeding    Hemorrhoids    Hot flashes    takes ZOloft 3 times a week   Hypertension    Hypothyroidism    takes Synthroid daily as result of AMiodarone   Insomnia    Macular degeneration    Mild aortic insufficiency    a. Echo 10/2013: mild AI.   Moderate mitral regurgitation by prior echocardiogram    a. Echo 10/2013.   Moderate obstructive sleep apnea    uses CPAP;sleep study about 4-559monthago   MVP (mitral valve prolapse)    Osteoporosis    PAF (paroxysmal atrial fibrillation) (HCC)    Peripheral edema    left   Presbycusis    Pulmonary hypertension (HCC)    RLS (restless legs syndrome)    Thyroid nodule    7m35mno change 11/11   Urethral  stricture    Urinary anastomotic stricture    Urinary retention    Vitamin D deficiency      Family History  Problem Relation Age of Onset   Heart attack Mother    CVA Mother    CAD Father    Colon cancer Father    Heart attack Father    Heart attack Brother    Peripheral vascular disease Brother    AAA (abdominal aortic aneurysm) Brother      Past Surgical History:  Procedure Laterality Date   APPENDECTOMY     bilateral cataract surgery     CARDIAC CATHETERIZATION  08-06-13   CORONARY ARTERY BYPASS GRAFT N/A 08/28/2013   Procedure: CORONARY ARTERY BYPASS GRAFTING (CABG) x2 using right greater saphenous vein and left internal  mammary artery. ;  Surgeon: Grace Isaac, MD;  Location: [facility];  Service: Open Heart Surgery;  Laterality: N/A;   HIP ARTHROPLASTY Right 12/09/2013   Procedure: ARTHROPLASTY BIPOLAR HIP CEMENTED;  Surgeon: Kerin Salen, MD;  Location: [place] [facility] [place];  Service: Orthopedics;  Laterality: Right;   HIP PINNING,CANNULATED Left 03/30/2021   Procedure: CANNULATED HIP PINNING;  Surgeon: Marchia Bond, MD;  Location: [facility];  Service: Orthopedics;  Laterality: Left;   INTRAOPERATIVE TRANSESOPHAGEAL ECHOCARDIOGRAM N/A 08/28/2013   Procedure: INTRAOPERATIVE TRANSESOPHAGEAL ECHOCARDIOGRAM;  Surgeon: Grace Isaac, MD;  Location: [facility] [place];  Service: Open Heart Surgery;  Laterality: N/A;   LEFT AND RIGHT HEART CATHETERIZATION WITH CORONARY ANGIOGRAM N/A 08/16/2013   Procedure: LEFT AND RIGHT HEART CATHETERIZATION WITH CORONARY ANGIOGRAM;  Surgeon: [name], MD;  Location: [facility];  Service: Cardiovascular;  Laterality: N/A;   MANDIBLE SURGERY     TCS     THORACENTESIS Right 07/22/2022   Procedure: THORACENTESIS;  Surgeon: Garner Nash, DO;  Location: [place];  Service: Pulmonary;  Laterality: Right;   TONSILLECTOMY     trigger thumb     TUBAL LIGATION      Social History   Socioeconomic History   Marital status: Widowed    Spouse name: Not on file   Number of children: Not on file   Years of education: Not on file   Highest education level: Not on file  Occupational History   Occupation: retired  Tobacco Use   Smoking status: Never   Smokeless tobacco: Never  Vaping Use   Vaping Use: Never used  Substance and Sexual Activity   Alcohol use: No   Drug use: No   Sexual activity: Yes    Birth control/protection: Surgical  Other Topics Concern   Not on file  Social History Narrative   Lives in retirement community   Right handed   Social Determinants of Health   Financial Resource Strain: Not on file  Food Insecurity: No Food Insecurity (08/08/2022)   Hunger Vital Sign     Worried About Running Out of Food in the Last Year: Never true    Ran Out of Food in the Last Year: Never true  Transportation Needs: No Transportation Needs (08/08/2022)   PRAPARE - Hydrologist (Medical): No    Lack of Transportation (Non-Medical): No  Physical Activity: Not on file  Stress: Not on file  Social Connections: Not on file  Intimate Partner Violence: Not At Risk (08/08/2022)   Humiliation, Afraid, Rape, and Kick questionnaire    Fear of Current or Ex-Partner: No    Emotionally Abused: No    Physically Abused: No  Sexually Abused: No     Allergies  Allergen Reactions   Fentanyl Nausea And Vomiting    Severe n/v   Amoxicillin Nausea Only and Other (See Comments)    dizziness   Atorvastatin Other (See Comments)    Unknown reaction - reported by Sadie Haber   Bupropion Other (See Comments)    nightmares   Codeine Nausea And Vomiting   Fosamax [Alendronate Sodium] Other (See Comments)    Jaw pain   Gabapentin Other (See Comments)    dizziness   Gluten Meal Other (See Comments)    Celiac Disease    Hydrochlorothiazide Other (See Comments)    Hyponatremia, GI upset    Tetanus Toxoid, Adsorbed Swelling    Severe swelling at site of injection (pt tolerates 1/2 injection then 1/2 week later)     Outpatient Medications Prior to Visit  Medication Sig Dispense Refill   acetaminophen (TYLENOL) 500 MG tablet Take 1 tablet (500 mg total) by mouth every 6 (six) hours as needed (pain). (Patient taking differently: Take 1,000 mg by mouth daily.) 30 tablet 0   amiodarone (PACERONE) 200 MG tablet TAKE 1 TABLET BY MOUTH DAILY (Patient taking differently: Take 200 mg by mouth at bedtime.) 90 tablet 2   apixaban (ELIQUIS) 2.5 MG TABS tablet Take 1 tablet (2.5 mg total) by mouth 2 (two) times daily. 60 tablet 0   benzonatate (TESSALON) 100 MG capsule Take 1 capsule (100 mg total) by mouth 3 (three) times daily as needed for up to 7 days for cough. 20  capsule 0   cholecalciferol (VITAMIN D3) 10 MCG (400 UNIT) TABS tablet Take 1 tablet (400 Units total) by mouth daily. 30 tablet 0   diltiazem (CARDIZEM CD) 120 MG 24 hr capsule Take 1 capsule (120 mg total) by mouth daily. 90 capsule 3   docusate sodium (COLACE) 100 MG capsule Take 100 mg by mouth at bedtime.     doxycycline (VIBRA-TABS) 100 MG tablet Take 1 tablet (100 mg total) by mouth 2 (two) times daily for 14 days. 28 tablet 0   ferrous sulfate 325 (65 FE) MG tablet Take 325 mg by mouth daily with breakfast.     furosemide (LASIX) 40 MG tablet Take 80 mg by mouth every other day, alternate with 40 mg by mouth every other day. (Patient taking differently: Take 80 mg by mouth every other day.) 135 tablet 3   guaiFENesin (ROBITUSSIN) 100 MG/5ML liquid Take 5 mLs by mouth every 4 (four) hours as needed for cough or to loosen phlegm. 120 mL 0   ipratropium-albuterol (DUONEB) 0.5-2.5 (3) MG/3ML SOLN Take 3 mLs by nebulization every 6 (six) hours as needed (for shortness of breath, excess coughing or wheezing). 360 mL 0   lactobacillus acidophilus (BACID) TABS tablet Take 1 tablet by mouth daily.     Levothyroxine Sodium 25 MCG CAPS Take 25 mcg by mouth daily before breakfast.     magnesium hydroxide (MILK OF MAGNESIA) 400 MG/5ML suspension Take 15 mLs by mouth at bedtime as needed for mild constipation.     melatonin 5 MG TABS Take 1-2 tablets (5-10 mg total) by mouth at bedtime. (Patient taking differently: Take 5 mg by mouth at bedtime.)  0   Menthol, Topical Analgesic, (ICY HOT EX) Apply 1 application topically daily as needed (pain).     Nutritional Supplements (BOOST PO) Take 273 mLs by mouth 3 (three) times daily.     ondansetron (ZOFRAN) 4 MG tablet Take 4 mg by mouth every  8 (eight) hours as needed for nausea or vomiting.     Polyethyl Glycol-Propyl Glycol (SYSTANE) 0.4-0.3 % SOLN Place 1 drop into both eyes at bedtime.     polyethylene glycol (MIRALAX) 17 g packet Take 17 g by mouth daily.  14 each 0   potassium chloride SA (KLOR-CON M) 20 MEQ tablet Take 2 tablets (40 mEq total) by mouth daily. 60 tablet 0   sertraline (ZOLOFT) 50 MG tablet Take 50 mg by mouth every morning.     sodium chloride 1 g tablet Take 1 g by mouth 3 (three) times daily.     traMADol (ULTRAM) 50 MG tablet Take 1 tablet (50 mg total) by mouth every 12 (twelve) hours as needed for up to 5 days for moderate pain. 10 tablet 0   No facility-administered medications prior to visit.    Review of Systems  Constitutional:  Positive for weight loss. Negative for chills, fever and malaise/fatigue.  HENT:  Negative for hearing loss, sore throat and tinnitus.   Eyes:  Negative for blurred vision and double vision.  Respiratory:  Positive for shortness of breath. Negative for cough, hemoptysis, sputum production, wheezing and stridor.   Cardiovascular:  Negative for chest pain, palpitations, orthopnea, leg swelling and PND.  Gastrointestinal:  Negative for abdominal pain, constipation, diarrhea, heartburn, nausea and vomiting.  Genitourinary:  Negative for dysuria, hematuria and urgency.  Musculoskeletal:  Negative for joint pain and myalgias.  Skin:  Negative for itching and rash.  Neurological:  Negative for dizziness, tingling, weakness and headaches.  Endo/Heme/Allergies:  Negative for environmental allergies. Does not bruise/bleed easily.  Psychiatric/Behavioral:  Negative for depression. The patient is not nervous/anxious and does not have insomnia.   All other systems reviewed and are negative.    Objective:  Physical Exam Vitals reviewed.  Constitutional:      General: She is not in acute distress.    Appearance: She is well-developed.  HENT:     Head: Normocephalic and atraumatic.     Mouth/Throat:     Pharynx: No oropharyngeal exudate.  Eyes:     Conjunctiva/sclera: Conjunctivae normal.     Pupils: Pupils are equal, round, and reactive to light.  Neck:     Vascular: No JVD.     Trachea: No  tracheal deviation.     Comments: Loss of supraclavicular fat Cardiovascular:     Rate and Rhythm: Normal rate and regular rhythm.     Heart sounds: S1 normal and S2 normal.     Comments: Distant heart tones Pulmonary:     Effort: No tachypnea or accessory muscle usage.     Breath sounds: No stridor. Decreased breath sounds (throughout all lung fields) and wheezing present. No rhonchi or rales.  Abdominal:     General: Bowel sounds are normal. There is no distension.     Palpations: Abdomen is soft.     Tenderness: There is no abdominal tenderness.  Musculoskeletal:        General: Deformity (muscle wasting ) present.     Comments: Severe kyphoscoliosis  Skin:    General: Skin is warm and dry.     Capillary Refill: Capillary refill takes less than 2 seconds.     Findings: No rash.  Neurological:     Mental Status: She is alert and oriented to person, place, and time.  Psychiatric:        Behavior: Behavior normal.      Vitals:   08/16/22 1319  BP: (!) 96/40  Pulse: 65  SpO2: 91%  Weight: 88 lb (39.9 kg)  Height: 5' (1.524 m)   91% on RA BMI Readings from Last 3 Encounters:  08/16/22 17.19 kg/m  08/09/22 17.52 kg/m  07/22/22 18.34 kg/m   Wt Readings from Last 3 Encounters:  08/16/22 88 lb (39.9 kg)  08/09/22 89 lb 11.6 oz (40.7 kg)  07/22/22 93 lb 14.7 oz (42.6 kg)     CBC    Component Value Date/Time   WBC 6.6 08/12/2022 0357   RBC 3.32 (L) 08/12/2022 0357   HGB 9.8 (L) 08/12/2022 0357   HGB 10.4 (L) 10/02/2021 1529   HCT 29.7 (L) 08/12/2022 0357   HCT 32.2 (L) 10/02/2021 1529   PLT 406 (H) 08/12/2022 0357   PLT 150 10/02/2021 1529   MCV 89.5 08/12/2022 0357   MCV 82 10/02/2021 1529   MCH 29.5 08/12/2022 0357   MCHC 33.0 08/12/2022 0357   RDW 15.9 (H) 08/12/2022 0357   RDW 14.3 10/02/2021 1529   LYMPHSABS 0.7 08/08/2022 0200   MONOABS 0.3 08/08/2022 0200   EOSABS 0.0 08/08/2022 0200   BASOSABS 0.0 08/08/2022 0200    Chest Imaging:  CT chest  without contrast 07/12/2022 revealed a 3.5 x 4.2 x 4 cm right lower lobe lung mass concerning for primary bronchogenic carcinoma.  Epic report located within care everywhere.  Imaging was completed at [facility].  Pulmonary Functions Testing Results:     No data to display          FeNO:   Pathology:   Echocardiogram:   Heart Catheterization:     Assessment & Plan:     ICD-10-CM   1. Right lower lobe lung mass  R91.8     2. Non-smoker  Z78.9     3. Pleural effusion, right  J90     4. Colonic mass  K63.89       Discussion:  This is a 88 year old female, right lower lobe lung mass pleural effusion, colon mass, abnormal PET scan concerning for an advanced stage malignancy likely: Primary with metastasis.  Plan: Today we had a long discussion regarding goals of care. I agree with outpatient palliative care and at some point will need to transition to hospice care we talked about this in detail.  They are going to discuss with the clinical providers at [facility]. She can follow-up with [place] as needed give [place] a call her family can reach out at any time.   Current Outpatient Medications:    acetaminophen (TYLENOL) 500 MG tablet, Take 1 tablet (500 mg total) by mouth every 6 (six) hours as needed (pain). (Patient taking differently: Take 1,000 mg by mouth daily.), Disp: 30 tablet, Rfl: 0   amiodarone (PACERONE) 200 MG tablet, TAKE 1 TABLET BY MOUTH DAILY (Patient taking differently: Take 200 mg by mouth at bedtime.), Disp: 90 tablet, Rfl: 2   apixaban (ELIQUIS) 2.5 MG TABS tablet, Take 1 tablet (2.5 mg total) by mouth 2 (two) times daily., Disp: 60 tablet, Rfl: 0   benzonatate (TESSALON) 100 MG capsule, Take 1 capsule (100 mg total) by mouth 3 (three) times daily as needed for up to 7 days for cough., Disp: 20 capsule, Rfl: 0   cholecalciferol (VITAMIN D3) 10 MCG (400 UNIT) TABS tablet, Take 1 tablet (400 Units total) by mouth daily., Disp: 30 tablet,  Rfl: 0   diltiazem (CARDIZEM CD) 120 MG 24 hr capsule, Take 1 capsule (120 mg total) by mouth daily., Disp:  90 capsule, Rfl: 3   docusate sodium (COLACE) 100 MG capsule, Take 100 mg by mouth at bedtime., Disp: , Rfl:    doxycycline (VIBRA-TABS) 100 MG tablet, Take 1 tablet (100 mg total) by mouth 2 (two) times daily for 14 days., Disp: 28 tablet, Rfl: 0   ferrous sulfate 325 (65 FE) MG tablet, Take 325 mg by mouth daily with breakfast., Disp: , Rfl:    furosemide (LASIX) 40 MG tablet, Take 80 mg by mouth every other day, alternate with 40 mg by mouth every other day. (Patient taking differently: Take 80 mg by mouth every other day.), Disp: 135 tablet, Rfl: 3   guaiFENesin (ROBITUSSIN) 100 MG/5ML liquid, Take 5 mLs by mouth every 4 (four) hours as needed for cough or to loosen phlegm., Disp: 120 mL, Rfl: 0   ipratropium-albuterol (DUONEB) 0.5-2.5 (3) MG/3ML SOLN, Take 3 mLs by nebulization every 6 (six) hours as needed (for shortness of breath, excess coughing or wheezing)., Disp: 360 mL, Rfl: 0   lactobacillus acidophilus (BACID) TABS tablet, Take 1 tablet by mouth daily., Disp: , Rfl:    Levothyroxine Sodium 25 MCG CAPS, Take 25 mcg by mouth daily before breakfast., Disp: , Rfl:    magnesium hydroxide (MILK OF MAGNESIA) 400 MG/5ML suspension, Take 15 mLs by mouth at bedtime as needed for mild constipation., Disp: , Rfl:    melatonin 5 MG TABS, Take 1-2 tablets (5-10 mg total) by mouth at bedtime. (Patient taking differently: Take 5 mg by mouth at bedtime.), Disp: , Rfl: 0   Menthol, Topical Analgesic, (ICY HOT EX), Apply 1 application topically daily as needed (pain)., Disp: , Rfl:    Nutritional Supplements (BOOST PO), Take 273 mLs by mouth 3 (three) times daily., Disp: , Rfl:    ondansetron (ZOFRAN) 4 MG tablet, Take 4 mg by mouth every 8 (eight) hours as needed for nausea or vomiting., Disp: , Rfl:    Polyethyl Glycol-Propyl Glycol (SYSTANE) 0.4-0.3 % SOLN, Place 1 drop into both eyes at bedtime.,  Disp: , Rfl:    polyethylene glycol (MIRALAX) 17 g packet, Take 17 g by mouth daily., Disp: 14 each, Rfl: 0   potassium chloride SA (KLOR-CON M) 20 MEQ tablet, Take 2 tablets (40 mEq total) by mouth daily., Disp: 60 tablet, Rfl: 0   sertraline (ZOLOFT) 50 MG tablet, Take 50 mg by mouth every morning., Disp: , Rfl:    sodium chloride 1 g tablet, Take 1 g by mouth 3 (three) times daily., Disp: , Rfl:    traMADol (ULTRAM) 50 MG tablet, Take 1 tablet (50 mg total) by mouth every 12 (twelve) hours as needed for up to 5 days for moderate pain., Disp: 10 tablet, Rfl: 0   Garner Nash, DO [facility] 08/16/2022 2:21 PM

## 2022-08-16 NOTE — Patient Instructions (Signed)
Thank you for visiting Dr. Valeta Harms at [facility]. Today we recommend the following:  Talk to the rehab physician group about timing for transition to hospice care.   Return if symptoms worsen or fail to improve.    Please do your part to reduce the spread of COVID-19.

## 2022-08-19 DIAGNOSIS — I25709 Atherosclerosis of coronary artery bypass graft(s), unspecified, with unspecified angina pectoris: Secondary | ICD-10-CM | POA: Diagnosis not present

## 2022-08-19 DIAGNOSIS — R5381 Other malaise: Secondary | ICD-10-CM | POA: Diagnosis not present

## 2022-08-19 DIAGNOSIS — S329XXD Fracture of unspecified parts of lumbosacral spine and pelvis, subsequent encounter for fracture with routine healing: Secondary | ICD-10-CM | POA: Diagnosis not present

## 2022-08-19 DIAGNOSIS — D491 Neoplasm of unspecified behavior of respiratory system: Secondary | ICD-10-CM | POA: Diagnosis not present

## 2022-08-19 DIAGNOSIS — I5032 Chronic diastolic (congestive) heart failure: Secondary | ICD-10-CM | POA: Diagnosis not present

## 2022-08-19 DIAGNOSIS — E039 Hypothyroidism, unspecified: Secondary | ICD-10-CM | POA: Diagnosis not present

## 2022-08-19 DIAGNOSIS — C189 Malignant neoplasm of colon, unspecified: Secondary | ICD-10-CM | POA: Diagnosis not present

## 2022-08-19 DIAGNOSIS — I480 Paroxysmal atrial fibrillation: Secondary | ICD-10-CM | POA: Diagnosis not present

## 2022-08-19 DIAGNOSIS — C787 Secondary malignant neoplasm of liver and intrahepatic bile duct: Secondary | ICD-10-CM | POA: Diagnosis not present

## 2022-08-23 ENCOUNTER — Non-Acute Institutional Stay: Payer: Medicare Other

## 2022-08-23 ENCOUNTER — Non-Acute Institutional Stay: Payer: Medicare Other | Admitting: *Deleted

## 2022-08-23 DIAGNOSIS — M1812 Unilateral primary osteoarthritis of first carpometacarpal joint, left hand: Secondary | ICD-10-CM | POA: Diagnosis not present

## 2022-08-23 DIAGNOSIS — Z515 Encounter for palliative care: Secondary | ICD-10-CM

## 2022-08-23 DIAGNOSIS — S32592D Other specified fracture of left pubis, subsequent encounter for fracture with routine healing: Secondary | ICD-10-CM | POA: Diagnosis not present

## 2022-08-23 NOTE — Progress Notes (Signed)
[facility] RN NOTE  PATIENT NAME: Maria [name] DOB: Aug 09, 1924 MRN: [id number]  PRIMARY CARE PROVIDER: [name], MD  RESPONSIBLE PARTY: Gunnar Fusi (daughter) Acct ID - Guarantor Home Phone Work Phone Relationship Acct Type  [id number] [name], [name]  Self P/F     [place] [place], [place], [place] [address]    RN/SW initial consult visit completed with patient today. RN conferenced in via telehealth. Patient in her room at [facility] on their SNF unit.  Cognitive: HOH. Able to engage in appropriate conversation and make needs known. Nurse reports patient has been in good spirits for the past week.  Pain: Denies pain or discomfort at this time.   Respiratory: Respirations even, regular and unlabored,  Appetite: Facility nurse Nicole Kindred reports that her appetite is fair to good.   Mobility: Ambulatory with 1 person assistance. At times requires wheelchair transport depending on how she is feeling. Requires 1 person assistance with ADLs. Going out for a Ortho appointment today to follow up on a pelvic and wrist fracture she sustained 2 months ago.  GU: Has a foley catheter. Upcoming follow up appointment with Urology on Wednesday. Staff reports patient does not want catheter, however needs it due to recent pelvic fracture, immobility and failed voiding trial. Per hospital records, she may become dependent.   Goals of care: Patient has a DNR. PET scan shows possible ascending colon cancer with local mets. Solitary mets lesion in the liver. Right lower lobe tumor believed to likely be cancer because of other metabolically active lymph nodes and intra thoracic region and also in the mesenteric. Due to her worsening debility and frailty per hospital records, she will benefit from supportive treatments but no cancer therapy. She wants to continue to attempt PT/OT at her facility.  HISTORY OF PRESENT ILLNESS:  This is a 88 yo female with a history of  Afib, CVA, HTN, and chronic diastolic heart failure. Patient was recently hospitalized from 08/08/22 to 08/12/22 due to worsening sob, cough and nausea. She was treated and released back to SNF to attempt rehab. Palliative care team was asked to follow patient by Dr. Merilynn Finland and will continue with goals of care and complex decision making.  CODE STATUS: DNR ADVANCED DIRECTIVES: Y MOST FORM: no PPS: 40%    Daryl Eastern, RN BSN

## 2022-08-23 NOTE — Progress Notes (Signed)
COMMUNITY PALLIATIVE CARE SW NOTE  PATIENT NAME: Maria [name] DOB: [birth year] MRN: [id number]  PRIMARY CARE PROVIDER: [name], MD  RESPONSIBLE PARTY:  Acct ID - Guarantor Home Phone Work Phone Relationship Acct Type  [id number] [name], [id number]  Self P/F     [place], [place], [place] [address]   Initial Palliative Care SOCIAL WORK VISIT  PC SW and RN-M. Nadara Mustard (via telehealth) completed an initial visit with patient at [facility]. The team provided introductions to patient, education regarding palliative care and services to her. Patient expressed interest in the program. The team consulted with patient and facility nurse-Tony to obtain a status update on patient.  Patient is thin and frail in appearance. She is hard of hearing, but is able to appropriately engage in dialogue and make her needs known. She denied pain or discomfort. Patient's appetite is fair. She report that breakfast is her best meal where she normally eats 50-75% of that meal. She will also drink 1-2 supplemental protein drinks. Patient is a 1 person assist and is transported via wheelchair. She requires assistance with ADL's. Patient has foley catheter. Patient was hospitalized from 10-15 to 08-12-2022. Patient is also recovering from a pelvic fracture. Patient has a PET scan shows possible ascending colon cancer with local mets. Solitary mets lesion in the liver. Right lower lobe tumor believed to likely be cancer because of other metabolically active lymph nodes and intra thoracic region and also in the mesenteric.   Palliative care team to continue to provide supportive visits to patient.  SOCIAL HX:  Social History   Tobacco Use   Smoking status: Never   Smokeless tobacco: Never  Substance Use Topics   Alcohol use: No    CODE STATUS: DNR ADVANCED DIRECTIVES: Yes MOST FORM COMPLETE:  No HOSPICE EDUCATION PROVIDED: No  Duration of visit and documentation: 60  minutes  Shamiya Demeritt, LCSW

## 2022-08-26 DIAGNOSIS — Z8701 Personal history of pneumonia (recurrent): Secondary | ICD-10-CM | POA: Diagnosis not present

## 2022-08-26 DIAGNOSIS — Z9181 History of falling: Secondary | ICD-10-CM | POA: Diagnosis not present

## 2022-08-26 DIAGNOSIS — K7689 Other specified diseases of liver: Secondary | ICD-10-CM | POA: Diagnosis not present

## 2022-08-26 DIAGNOSIS — R590 Localized enlarged lymph nodes: Secondary | ICD-10-CM | POA: Diagnosis not present

## 2022-08-26 DIAGNOSIS — K9289 Other specified diseases of the digestive system: Secondary | ICD-10-CM | POA: Diagnosis not present

## 2022-09-04 DIAGNOSIS — C7801 Secondary malignant neoplasm of right lung: Secondary | ICD-10-CM | POA: Diagnosis present

## 2022-09-04 DIAGNOSIS — R55 Syncope and collapse: Secondary | ICD-10-CM | POA: Diagnosis not present

## 2022-09-04 DIAGNOSIS — Z960 Presence of urogenital implants: Secondary | ICD-10-CM | POA: Diagnosis not present

## 2022-09-04 DIAGNOSIS — Z85038 Personal history of other malignant neoplasm of large intestine: Secondary | ICD-10-CM | POA: Diagnosis not present

## 2022-09-04 DIAGNOSIS — G9340 Encephalopathy, unspecified: Secondary | ICD-10-CM | POA: Diagnosis not present

## 2022-09-04 DIAGNOSIS — I469 Cardiac arrest, cause unspecified: Secondary | ICD-10-CM | POA: Diagnosis not present

## 2022-09-04 DIAGNOSIS — G9389 Other specified disorders of brain: Secondary | ICD-10-CM | POA: Diagnosis not present

## 2022-09-04 DIAGNOSIS — I482 Chronic atrial fibrillation, unspecified: Secondary | ICD-10-CM | POA: Diagnosis present

## 2022-09-04 DIAGNOSIS — I110 Hypertensive heart disease with heart failure: Secondary | ICD-10-CM | POA: Diagnosis present

## 2022-09-04 DIAGNOSIS — R231 Pallor: Secondary | ICD-10-CM | POA: Diagnosis not present

## 2022-09-04 DIAGNOSIS — Z993 Dependence on wheelchair: Secondary | ICD-10-CM | POA: Diagnosis not present

## 2022-09-04 DIAGNOSIS — E039 Hypothyroidism, unspecified: Secondary | ICD-10-CM | POA: Diagnosis not present

## 2022-09-04 DIAGNOSIS — T68XXXA Hypothermia, initial encounter: Secondary | ICD-10-CM | POA: Diagnosis not present

## 2022-09-04 DIAGNOSIS — C785 Secondary malignant neoplasm of large intestine and rectum: Secondary | ICD-10-CM | POA: Diagnosis present

## 2022-09-04 DIAGNOSIS — R1110 Vomiting, unspecified: Secondary | ICD-10-CM | POA: Diagnosis not present

## 2022-09-04 DIAGNOSIS — R591 Generalized enlarged lymph nodes: Secondary | ICD-10-CM | POA: Diagnosis present

## 2022-09-04 DIAGNOSIS — S2241XA Multiple fractures of ribs, right side, initial encounter for closed fracture: Secondary | ICD-10-CM | POA: Diagnosis not present

## 2022-09-04 DIAGNOSIS — J90 Pleural effusion, not elsewhere classified: Secondary | ICD-10-CM | POA: Diagnosis not present

## 2022-09-04 DIAGNOSIS — R64 Cachexia: Secondary | ICD-10-CM | POA: Diagnosis present

## 2022-09-04 DIAGNOSIS — R54 Age-related physical debility: Secondary | ICD-10-CM | POA: Diagnosis present

## 2022-09-04 DIAGNOSIS — I5022 Chronic systolic (congestive) heart failure: Secondary | ICD-10-CM | POA: Diagnosis present

## 2022-09-04 DIAGNOSIS — E871 Hypo-osmolality and hyponatremia: Secondary | ICD-10-CM | POA: Diagnosis not present

## 2022-09-04 DIAGNOSIS — R4182 Altered mental status, unspecified: Secondary | ICD-10-CM | POA: Diagnosis not present

## 2022-09-04 DIAGNOSIS — R2990 Unspecified symptoms and signs involving the nervous system: Secondary | ICD-10-CM | POA: Diagnosis not present

## 2022-09-04 DIAGNOSIS — E860 Dehydration: Secondary | ICD-10-CM | POA: Diagnosis present

## 2022-09-04 DIAGNOSIS — S22050A Wedge compression fracture of T5-T6 vertebra, initial encounter for closed fracture: Secondary | ICD-10-CM | POA: Diagnosis not present

## 2022-09-04 DIAGNOSIS — R569 Unspecified convulsions: Secondary | ICD-10-CM | POA: Diagnosis not present

## 2022-09-04 DIAGNOSIS — I5020 Unspecified systolic (congestive) heart failure: Secondary | ICD-10-CM | POA: Diagnosis not present

## 2022-09-04 DIAGNOSIS — R1084 Generalized abdominal pain: Secondary | ICD-10-CM | POA: Diagnosis not present

## 2022-09-04 DIAGNOSIS — Z66 Do not resuscitate: Secondary | ICD-10-CM | POA: Diagnosis not present

## 2022-09-04 DIAGNOSIS — Z8249 Family history of ischemic heart disease and other diseases of the circulatory system: Secondary | ICD-10-CM | POA: Diagnosis not present

## 2022-09-04 DIAGNOSIS — R112 Nausea with vomiting, unspecified: Secondary | ICD-10-CM | POA: Diagnosis not present

## 2022-09-04 DIAGNOSIS — Z515 Encounter for palliative care: Secondary | ICD-10-CM | POA: Diagnosis not present

## 2022-09-04 DIAGNOSIS — C787 Secondary malignant neoplasm of liver and intrahepatic bile duct: Secondary | ICD-10-CM | POA: Diagnosis present

## 2022-09-04 DIAGNOSIS — Z7901 Long term (current) use of anticoagulants: Secondary | ICD-10-CM | POA: Diagnosis not present

## 2022-09-04 DIAGNOSIS — S3210XD Unspecified fracture of sacrum, subsequent encounter for fracture with routine healing: Secondary | ICD-10-CM | POA: Diagnosis not present

## 2022-09-04 DIAGNOSIS — Z8781 Personal history of (healed) traumatic fracture: Secondary | ICD-10-CM | POA: Diagnosis not present

## 2022-09-04 DIAGNOSIS — Z951 Presence of aortocoronary bypass graft: Secondary | ICD-10-CM | POA: Diagnosis not present

## 2022-09-04 DIAGNOSIS — C189 Malignant neoplasm of colon, unspecified: Secondary | ICD-10-CM | POA: Diagnosis not present

## 2022-09-04 DIAGNOSIS — C801 Malignant (primary) neoplasm, unspecified: Secondary | ICD-10-CM | POA: Diagnosis not present

## 2022-09-04 DIAGNOSIS — K900 Celiac disease: Secondary | ICD-10-CM | POA: Diagnosis present

## 2022-09-04 DIAGNOSIS — S32502D Unspecified fracture of left pubis, subsequent encounter for fracture with routine healing: Secondary | ICD-10-CM | POA: Diagnosis not present

## 2022-09-04 DIAGNOSIS — C7800 Secondary malignant neoplasm of unspecified lung: Secondary | ICD-10-CM | POA: Diagnosis not present

## 2022-09-04 DIAGNOSIS — R001 Bradycardia, unspecified: Secondary | ICD-10-CM | POA: Diagnosis not present

## 2022-09-04 DIAGNOSIS — G9341 Metabolic encephalopathy: Secondary | ICD-10-CM | POA: Diagnosis present

## 2022-09-04 DIAGNOSIS — I2510 Atherosclerotic heart disease of native coronary artery without angina pectoris: Secondary | ICD-10-CM | POA: Diagnosis not present

## 2022-09-04 DIAGNOSIS — Z9181 History of falling: Secondary | ICD-10-CM | POA: Diagnosis not present

## 2022-09-04 DIAGNOSIS — R0689 Other abnormalities of breathing: Secondary | ICD-10-CM | POA: Diagnosis not present

## 2022-09-04 DIAGNOSIS — I4891 Unspecified atrial fibrillation: Secondary | ICD-10-CM | POA: Diagnosis not present

## 2022-09-05 DIAGNOSIS — Z8781 Personal history of (healed) traumatic fracture: Secondary | ICD-10-CM | POA: Diagnosis not present

## 2022-09-05 DIAGNOSIS — Z960 Presence of urogenital implants: Secondary | ICD-10-CM | POA: Diagnosis not present

## 2022-09-05 DIAGNOSIS — R2990 Unspecified symptoms and signs involving the nervous system: Secondary | ICD-10-CM | POA: Diagnosis not present

## 2022-09-05 DIAGNOSIS — K900 Celiac disease: Secondary | ICD-10-CM | POA: Diagnosis present

## 2022-09-05 DIAGNOSIS — Z9181 History of falling: Secondary | ICD-10-CM | POA: Diagnosis not present

## 2022-09-05 DIAGNOSIS — E860 Dehydration: Secondary | ICD-10-CM | POA: Diagnosis present

## 2022-09-05 DIAGNOSIS — R591 Generalized enlarged lymph nodes: Secondary | ICD-10-CM | POA: Diagnosis present

## 2022-09-05 DIAGNOSIS — C801 Malignant (primary) neoplasm, unspecified: Secondary | ICD-10-CM | POA: Diagnosis present

## 2022-09-05 DIAGNOSIS — S2241XA Multiple fractures of ribs, right side, initial encounter for closed fracture: Secondary | ICD-10-CM | POA: Diagnosis present

## 2022-09-05 DIAGNOSIS — I110 Hypertensive heart disease with heart failure: Secondary | ICD-10-CM | POA: Diagnosis present

## 2022-09-05 DIAGNOSIS — G9340 Encephalopathy, unspecified: Secondary | ICD-10-CM | POA: Diagnosis not present

## 2022-09-05 DIAGNOSIS — R112 Nausea with vomiting, unspecified: Secondary | ICD-10-CM | POA: Diagnosis not present

## 2022-09-05 DIAGNOSIS — R64 Cachexia: Secondary | ICD-10-CM | POA: Diagnosis present

## 2022-09-05 DIAGNOSIS — Z993 Dependence on wheelchair: Secondary | ICD-10-CM | POA: Diagnosis not present

## 2022-09-05 DIAGNOSIS — I2510 Atherosclerotic heart disease of native coronary artery without angina pectoris: Secondary | ICD-10-CM | POA: Diagnosis present

## 2022-09-05 DIAGNOSIS — Z951 Presence of aortocoronary bypass graft: Secondary | ICD-10-CM | POA: Diagnosis not present

## 2022-09-05 DIAGNOSIS — E039 Hypothyroidism, unspecified: Secondary | ICD-10-CM | POA: Diagnosis present

## 2022-09-05 DIAGNOSIS — R001 Bradycardia, unspecified: Secondary | ICD-10-CM | POA: Diagnosis present

## 2022-09-05 DIAGNOSIS — R54 Age-related physical debility: Secondary | ICD-10-CM | POA: Diagnosis present

## 2022-09-05 DIAGNOSIS — I5022 Chronic systolic (congestive) heart failure: Secondary | ICD-10-CM | POA: Diagnosis present

## 2022-09-05 DIAGNOSIS — C785 Secondary malignant neoplasm of large intestine and rectum: Secondary | ICD-10-CM | POA: Diagnosis present

## 2022-09-05 DIAGNOSIS — Z7901 Long term (current) use of anticoagulants: Secondary | ICD-10-CM | POA: Diagnosis not present

## 2022-09-05 DIAGNOSIS — C7801 Secondary malignant neoplasm of right lung: Secondary | ICD-10-CM | POA: Diagnosis present

## 2022-09-05 DIAGNOSIS — C787 Secondary malignant neoplasm of liver and intrahepatic bile duct: Secondary | ICD-10-CM | POA: Diagnosis present

## 2022-09-05 DIAGNOSIS — Z66 Do not resuscitate: Secondary | ICD-10-CM | POA: Diagnosis present

## 2022-09-05 DIAGNOSIS — E871 Hypo-osmolality and hyponatremia: Secondary | ICD-10-CM | POA: Diagnosis present

## 2022-09-05 DIAGNOSIS — R569 Unspecified convulsions: Secondary | ICD-10-CM | POA: Diagnosis present

## 2022-09-05 DIAGNOSIS — Z515 Encounter for palliative care: Secondary | ICD-10-CM | POA: Diagnosis not present

## 2022-09-05 DIAGNOSIS — G9341 Metabolic encephalopathy: Secondary | ICD-10-CM | POA: Diagnosis present

## 2022-09-05 DIAGNOSIS — Z8249 Family history of ischemic heart disease and other diseases of the circulatory system: Secondary | ICD-10-CM | POA: Diagnosis not present

## 2022-09-05 DIAGNOSIS — I482 Chronic atrial fibrillation, unspecified: Secondary | ICD-10-CM | POA: Diagnosis present

## 2022-09-24 DEATH — deceased
